# Patient Record
Sex: Male | Born: 1975 | Race: Black or African American | Hispanic: No | Marital: Single | Smoking: Never smoker
Health system: Southern US, Community
[De-identification: ages and names within clinical notes are randomized; demographics above are authoritative.]

## PROBLEM LIST (undated history)

## (undated) VITALS — BP 108/70 | HR 92 | Temp 98.6°F | Resp 20 | Ht 74.0 in | Wt 292.3 lb

## (undated) DIAGNOSIS — M109 Gout, unspecified: Secondary | ICD-10-CM

## (undated) DIAGNOSIS — I10 Essential (primary) hypertension: Secondary | ICD-10-CM

## (undated) DIAGNOSIS — N19 Unspecified kidney failure: Secondary | ICD-10-CM

## (undated) DIAGNOSIS — G629 Polyneuropathy, unspecified: Secondary | ICD-10-CM

## (undated) DIAGNOSIS — J189 Pneumonia, unspecified organism: Secondary | ICD-10-CM

## (undated) DIAGNOSIS — E119 Type 2 diabetes mellitus without complications: Secondary | ICD-10-CM

## (undated) DIAGNOSIS — M5136 Other intervertebral disc degeneration, lumbar region: Secondary | ICD-10-CM

## (undated) DIAGNOSIS — Z992 Dependence on renal dialysis: Secondary | ICD-10-CM

## (undated) DIAGNOSIS — E213 Hyperparathyroidism, unspecified: Secondary | ICD-10-CM

## (undated) DIAGNOSIS — G610 Guillain-Barre syndrome: Secondary | ICD-10-CM

## (undated) DIAGNOSIS — N186 End stage renal disease: Secondary | ICD-10-CM

## (undated) DIAGNOSIS — M51369 Other intervertebral disc degeneration, lumbar region without mention of lumbar back pain or lower extremity pain: Secondary | ICD-10-CM

## (undated) DIAGNOSIS — J9690 Respiratory failure, unspecified, unspecified whether with hypoxia or hypercapnia: Secondary | ICD-10-CM

## (undated) DIAGNOSIS — Z22322 Carrier or suspected carrier of Methicillin resistant Staphylococcus aureus: Secondary | ICD-10-CM

## (undated) DIAGNOSIS — N289 Disorder of kidney and ureter, unspecified: Secondary | ICD-10-CM

## (undated) DIAGNOSIS — F329 Major depressive disorder, single episode, unspecified: Secondary | ICD-10-CM

## (undated) DIAGNOSIS — F32A Depression, unspecified: Secondary | ICD-10-CM

## (undated) DIAGNOSIS — R011 Cardiac murmur, unspecified: Secondary | ICD-10-CM

## (undated) DIAGNOSIS — R627 Adult failure to thrive: Secondary | ICD-10-CM

## (undated) DIAGNOSIS — I509 Heart failure, unspecified: Secondary | ICD-10-CM

## (undated) DIAGNOSIS — K219 Gastro-esophageal reflux disease without esophagitis: Secondary | ICD-10-CM

## (undated) HISTORY — PX: AV FISTULA PLACEMENT: SHX1204

## (undated) HISTORY — DX: Depression, unspecified: F32.A

## (undated) HISTORY — PX: PARATHYROIDECTOMY: SHX19

## (undated) HISTORY — DX: Unspecified kidney failure: N19

## (undated) HISTORY — DX: Gout, unspecified: M10.9

## (undated) HISTORY — PX: TONSILLECTOMY: SUR1361

## (undated) HISTORY — DX: Major depressive disorder, single episode, unspecified: F32.9

## (undated) HISTORY — PX: RENAL BIOPSY: SHX156

## (undated) HISTORY — DX: Essential (primary) hypertension: I10

## (undated) HISTORY — PX: OTHER SURGICAL HISTORY: SHX169

---

## 1998-04-27 DIAGNOSIS — N033 Chronic nephritic syndrome with diffuse mesangial proliferative glomerulonephritis: Secondary | ICD-10-CM | POA: Insufficient documentation

## 2003-05-04 ENCOUNTER — Inpatient Hospital Stay (HOSPITAL_COMMUNITY): Admission: EM | Admit: 2003-05-04 | Discharge: 2003-05-07 | Payer: Self-pay | Admitting: Emergency Medicine

## 2003-05-14 ENCOUNTER — Ambulatory Visit (HOSPITAL_COMMUNITY): Admission: RE | Admit: 2003-05-14 | Discharge: 2003-05-14 | Payer: Self-pay | Admitting: Cardiology

## 2003-07-26 ENCOUNTER — Inpatient Hospital Stay (HOSPITAL_COMMUNITY): Admission: EM | Admit: 2003-07-26 | Discharge: 2003-07-30 | Payer: Self-pay | Admitting: Emergency Medicine

## 2009-05-30 ENCOUNTER — Inpatient Hospital Stay: Payer: Self-pay | Admitting: *Deleted

## 2010-11-29 DIAGNOSIS — Z992 Dependence on renal dialysis: Secondary | ICD-10-CM | POA: Insufficient documentation

## 2011-02-17 ENCOUNTER — Emergency Department: Payer: Self-pay | Admitting: Unknown Physician Specialty

## 2012-05-15 DIAGNOSIS — E214 Other specified disorders of parathyroid gland: Secondary | ICD-10-CM | POA: Insufficient documentation

## 2012-06-08 ENCOUNTER — Inpatient Hospital Stay: Payer: Self-pay

## 2012-06-08 LAB — COMPREHENSIVE METABOLIC PANEL
Albumin: 3.8 g/dL (ref 3.4–5.0)
Anion Gap: 12 (ref 7–16)
BUN: 86 mg/dL — ABNORMAL HIGH (ref 7–18)
Bilirubin,Total: 0.5 mg/dL (ref 0.2–1.0)
Calcium, Total: 7.1 mg/dL — ABNORMAL LOW (ref 8.5–10.1)
Chloride: 105 mmol/L (ref 98–107)
EGFR (Non-African Amer.): 3 — ABNORMAL LOW
Glucose: 92 mg/dL (ref 65–99)
Osmolality: 307 (ref 275–301)
Potassium: 5.9 mmol/L — ABNORMAL HIGH (ref 3.5–5.1)
SGOT(AST): 24 U/L (ref 15–37)
Sodium: 141 mmol/L (ref 136–145)

## 2012-06-08 LAB — PHOSPHORUS: Phosphorus: 3.5 mg/dL (ref 2.5–4.9)

## 2012-06-08 LAB — CBC
HCT: 30.6 % — ABNORMAL LOW (ref 40.0–52.0)
HGB: 10.1 g/dL — ABNORMAL LOW (ref 13.0–18.0)
MCV: 89 fL (ref 80–100)
WBC: 5.7 10*3/uL (ref 3.8–10.6)

## 2012-06-08 LAB — TROPONIN I: Troponin-I: 0.04 ng/mL

## 2012-06-09 LAB — CBC WITH DIFFERENTIAL/PLATELET
Basophil #: 0.1 10*3/uL (ref 0.0–0.1)
Basophil %: 0.7 %
Eosinophil #: 0.2 10*3/uL (ref 0.0–0.7)
HGB: 10.5 g/dL — ABNORMAL LOW (ref 13.0–18.0)
Lymphocyte #: 1.1 10*3/uL (ref 1.0–3.6)
Lymphocyte %: 15.5 %
MCH: 28.9 pg (ref 26.0–34.0)
MCV: 89 fL (ref 80–100)
Monocyte #: 0.6 x10 3/mm (ref 0.2–1.0)
Monocyte %: 7.9 %
Neutrophil #: 5.3 10*3/uL (ref 1.4–6.5)
Platelet: 116 10*3/uL — ABNORMAL LOW (ref 150–440)
RDW: 16.4 % — ABNORMAL HIGH (ref 11.5–14.5)
WBC: 7.3 10*3/uL (ref 3.8–10.6)

## 2012-06-09 LAB — BASIC METABOLIC PANEL
Anion Gap: 13 (ref 7–16)
BUN: 66 mg/dL — ABNORMAL HIGH (ref 7–18)
Calcium, Total: 7.7 mg/dL — ABNORMAL LOW (ref 8.5–10.1)
Chloride: 101 mmol/L (ref 98–107)
Co2: 26 mmol/L (ref 21–32)
Glucose: 87 mg/dL (ref 65–99)
Osmolality: 298 (ref 275–301)
Potassium: 4.1 mmol/L (ref 3.5–5.1)
Sodium: 140 mmol/L (ref 136–145)

## 2012-12-09 DIAGNOSIS — Z8669 Personal history of other diseases of the nervous system and sense organs: Secondary | ICD-10-CM | POA: Insufficient documentation

## 2012-12-09 DIAGNOSIS — M21379 Foot drop, unspecified foot: Secondary | ICD-10-CM | POA: Insufficient documentation

## 2013-02-03 ENCOUNTER — Emergency Department: Payer: Self-pay | Admitting: Emergency Medicine

## 2013-02-03 LAB — CBC
HCT: 42.2 % (ref 40.0–52.0)
HGB: 14.2 g/dL (ref 13.0–18.0)
MCH: 31.6 pg (ref 26.0–34.0)
MCHC: 33.6 g/dL (ref 32.0–36.0)
MCV: 94 fL (ref 80–100)
Platelet: 149 10*3/uL — ABNORMAL LOW (ref 150–440)
RBC: 4.48 10*6/uL (ref 4.40–5.90)
RDW: 18.9 % — ABNORMAL HIGH (ref 11.5–14.5)
WBC: 9.2 10*3/uL (ref 3.8–10.6)

## 2013-02-03 LAB — COMPREHENSIVE METABOLIC PANEL
Albumin: 4.4 g/dL (ref 3.4–5.0)
Alkaline Phosphatase: 79 U/L (ref 50–136)
BUN: 35 mg/dL — ABNORMAL HIGH (ref 7–18)
Calcium, Total: 11.2 mg/dL — ABNORMAL HIGH (ref 8.5–10.1)
Chloride: 97 mmol/L — ABNORMAL LOW (ref 98–107)
Creatinine: 10.71 mg/dL — ABNORMAL HIGH (ref 0.60–1.30)
EGFR (African American): 6 — ABNORMAL LOW
EGFR (Non-African Amer.): 5 — ABNORMAL LOW
Glucose: 114 mg/dL — ABNORMAL HIGH (ref 65–99)
Osmolality: 281 (ref 275–301)
Potassium: 4.5 mmol/L (ref 3.5–5.1)
SGOT(AST): 24 U/L (ref 15–37)
SGPT (ALT): 31 U/L (ref 12–78)
Total Protein: 9.2 g/dL — ABNORMAL HIGH (ref 6.4–8.2)

## 2013-02-03 LAB — TROPONIN I: Troponin-I: <0.02 ng/mL

## 2013-02-16 ENCOUNTER — Emergency Department: Payer: Self-pay | Admitting: Emergency Medicine

## 2013-02-17 LAB — POTASSIUM: Potassium: 5.8 mmol/L — ABNORMAL HIGH (ref 3.5–5.1)

## 2013-02-19 DIAGNOSIS — T82590A Other mechanical complication of surgically created arteriovenous fistula, initial encounter: Secondary | ICD-10-CM | POA: Insufficient documentation

## 2013-04-21 DIAGNOSIS — D689 Coagulation defect, unspecified: Secondary | ICD-10-CM | POA: Insufficient documentation

## 2013-04-21 DIAGNOSIS — T7849XA Other allergy, initial encounter: Secondary | ICD-10-CM | POA: Insufficient documentation

## 2013-06-06 ENCOUNTER — Inpatient Hospital Stay: Payer: Self-pay

## 2013-06-06 LAB — COMPREHENSIVE METABOLIC PANEL
Albumin: 4.2 g/dL (ref 3.4–5.0)
Alkaline Phosphatase: 67 U/L
Anion Gap: 25 — ABNORMAL HIGH (ref 7–16)
BUN: 56 mg/dL — ABNORMAL HIGH (ref 7–18)
Bilirubin,Total: 0.5 mg/dL (ref 0.2–1.0)
Calcium, Total: 9.5 mg/dL (ref 8.5–10.1)
Chloride: 99 mmol/L (ref 98–107)
Co2: 23 mmol/L (ref 21–32)
Creatinine: 14.81 mg/dL — ABNORMAL HIGH (ref 0.60–1.30)
EGFR (African American): 4 — ABNORMAL LOW
EGFR (Non-African Amer.): 4 — ABNORMAL LOW
Glucose: 122 mg/dL — ABNORMAL HIGH (ref 65–99)
Osmolality: 309 (ref 275–301)
Potassium: 4.9 mmol/L (ref 3.5–5.1)
SGOT(AST): 61 U/L — ABNORMAL HIGH (ref 15–37)
SGPT (ALT): 86 U/L — ABNORMAL HIGH (ref 12–78)
Sodium: 147 mmol/L — ABNORMAL HIGH (ref 136–145)
Total Protein: 8.7 g/dL — ABNORMAL HIGH (ref 6.4–8.2)

## 2013-06-06 LAB — PROTIME-INR
INR: 1.2
PROTHROMBIN TIME: 14.7 s (ref 11.5–14.7)

## 2013-06-06 LAB — CBC WITH DIFFERENTIAL/PLATELET
Basophil #: 0.1 10*3/uL (ref 0.0–0.1)
Basophil %: 0.9 %
Eosinophil #: 0.2 10*3/uL (ref 0.0–0.7)
Eosinophil %: 1 %
HCT: 43.4 % (ref 40.0–52.0)
HGB: 13.9 g/dL (ref 13.0–18.0)
Lymphocyte #: 1.9 10*3/uL (ref 1.0–3.6)
Lymphocyte %: 12.3 %
MCH: 31.1 pg (ref 26.0–34.0)
MCHC: 32 g/dL (ref 32.0–36.0)
MCV: 97 fL (ref 80–100)
Monocyte #: 1.1 x10 3/mm — ABNORMAL HIGH (ref 0.2–1.0)
Monocyte %: 7.4 %
Neutrophil #: 11.9 10*3/uL — ABNORMAL HIGH (ref 1.4–6.5)
Neutrophil %: 78.4 %
Platelet: 166 10*3/uL (ref 150–440)
RBC: 4.47 10*6/uL (ref 4.40–5.90)
RDW: 18.9 % — ABNORMAL HIGH (ref 11.5–14.5)
WBC: 15.1 10*3/uL — ABNORMAL HIGH (ref 3.8–10.6)

## 2013-06-06 LAB — APTT: ACTIVATED PTT: 37.8 s — AB (ref 23.6–35.9)

## 2013-06-06 LAB — LIPASE, BLOOD: Lipase: 203 U/L (ref 73–393)

## 2013-06-06 LAB — CLOSTRIDIUM DIFFICILE(ARMC)

## 2013-06-06 LAB — CK: CK, Total: 203 U/L

## 2013-06-06 LAB — TROPONIN I: Troponin-I: <0.02 ng/mL

## 2013-06-06 LAB — SALICYLATE LEVEL: Salicylates, Serum: <1.7 mg/dL

## 2013-06-06 LAB — HEMOGLOBIN: HGB: 12.2 g/dL — ABNORMAL LOW (ref 13.0–18.0)

## 2013-06-07 LAB — CBC WITH DIFFERENTIAL/PLATELET
Bands: 1 %
Basophil: 1 %
Eosinophil: 1 %
HCT: 35.3 % — ABNORMAL LOW (ref 40.0–52.0)
HGB: 11.6 g/dL — ABNORMAL LOW (ref 13.0–18.0)
Lymphocytes: 28 %
MCH: 31.8 pg (ref 26.0–34.0)
MCHC: 32.9 g/dL (ref 32.0–36.0)
MCV: 97 fL (ref 80–100)
MONOS PCT: 3 %
PLATELETS: 102 10*3/uL — AB (ref 150–440)
RBC: 3.64 10*6/uL — ABNORMAL LOW (ref 4.40–5.90)
RDW: 18.7 % — ABNORMAL HIGH (ref 11.5–14.5)
Segmented Neutrophils: 66 %
WBC: 7.5 10*3/uL (ref 3.8–10.6)

## 2013-06-07 LAB — COMPREHENSIVE METABOLIC PANEL
Albumin: 3.3 g/dL — ABNORMAL LOW (ref 3.4–5.0)
Alkaline Phosphatase: 57 U/L
Anion Gap: 5 — ABNORMAL LOW (ref 7–16)
BUN: 36 mg/dL — ABNORMAL HIGH (ref 7–18)
Bilirubin,Total: 0.6 mg/dL (ref 0.2–1.0)
CALCIUM: 8.5 mg/dL (ref 8.5–10.1)
CREATININE: 11.88 mg/dL — AB (ref 0.60–1.30)
Chloride: 101 mmol/L (ref 98–107)
Co2: 29 mmol/L (ref 21–32)
EGFR (Non-African Amer.): 5 — ABNORMAL LOW
GFR CALC AF AMER: 6 — AB
Glucose: 89 mg/dL (ref 65–99)
Osmolality: 278 (ref 275–301)
Potassium: 4.5 mmol/L (ref 3.5–5.1)
SGOT(AST): 35 U/L (ref 15–37)
SGPT (ALT): 54 U/L (ref 12–78)
Sodium: 135 mmol/L — ABNORMAL LOW (ref 136–145)
Total Protein: 6.9 g/dL (ref 6.4–8.2)

## 2013-06-07 LAB — WBCS, STOOL

## 2013-06-08 LAB — CBC WITH DIFFERENTIAL/PLATELET
Basophil #: 0 10*3/uL (ref 0.0–0.1)
Basophil %: 0.6 %
EOS ABS: 0.1 10*3/uL (ref 0.0–0.7)
Eosinophil %: 2.9 %
HCT: 34 % — ABNORMAL LOW (ref 40.0–52.0)
HGB: 11.4 g/dL — AB (ref 13.0–18.0)
Lymphocyte #: 1.1 10*3/uL (ref 1.0–3.6)
Lymphocyte %: 24.2 %
MCH: 32.6 pg (ref 26.0–34.0)
MCHC: 33.6 g/dL (ref 32.0–36.0)
MCV: 97 fL (ref 80–100)
Monocyte #: 0.5 x10 3/mm (ref 0.2–1.0)
Monocyte %: 11.7 %
Neutrophil #: 2.8 10*3/uL (ref 1.4–6.5)
Neutrophil %: 60.6 %
Platelet: 96 10*3/uL — ABNORMAL LOW (ref 150–440)
RBC: 3.51 10*6/uL — AB (ref 4.40–5.90)
RDW: 19.1 % — ABNORMAL HIGH (ref 11.5–14.5)
WBC: 4.7 10*3/uL (ref 3.8–10.6)

## 2013-06-08 LAB — BASIC METABOLIC PANEL
Anion Gap: 15 (ref 7–16)
BUN: 47 mg/dL — ABNORMAL HIGH (ref 7–18)
CALCIUM: 8.3 mg/dL — AB (ref 8.5–10.1)
Chloride: 100 mmol/L (ref 98–107)
Co2: 23 mmol/L (ref 21–32)
Creatinine: 15.13 mg/dL — ABNORMAL HIGH (ref 0.60–1.30)
EGFR (African American): 4 — ABNORMAL LOW
GFR CALC NON AF AMER: 4 — AB
Glucose: 96 mg/dL (ref 65–99)
OSMOLALITY: 288 (ref 275–301)
POTASSIUM: 4.3 mmol/L (ref 3.5–5.1)
Sodium: 138 mmol/L (ref 136–145)

## 2013-06-09 LAB — CBC WITH DIFFERENTIAL/PLATELET
BASOS ABS: 0 10*3/uL (ref 0.0–0.1)
BASOS PCT: 0.8 %
EOS ABS: 0.2 10*3/uL (ref 0.0–0.7)
Eosinophil %: 3.2 %
HCT: 31.4 % — ABNORMAL LOW (ref 40.0–52.0)
HGB: 10.2 g/dL — ABNORMAL LOW (ref 13.0–18.0)
LYMPHS PCT: 20.6 %
Lymphocyte #: 1 10*3/uL (ref 1.0–3.6)
MCH: 31.3 pg (ref 26.0–34.0)
MCHC: 32.6 g/dL (ref 32.0–36.0)
MCV: 96 fL (ref 80–100)
MONOS PCT: 11.2 %
Monocyte #: 0.5 x10 3/mm (ref 0.2–1.0)
NEUTROS PCT: 64.2 %
Neutrophil #: 3.1 10*3/uL (ref 1.4–6.5)
PLATELETS: 106 10*3/uL — AB (ref 150–440)
RBC: 3.27 10*6/uL — AB (ref 4.40–5.90)
RDW: 18.6 % — ABNORMAL HIGH (ref 11.5–14.5)
WBC: 4.9 10*3/uL (ref 3.8–10.6)

## 2013-06-09 LAB — STOOL CULTURE

## 2013-06-09 LAB — POTASSIUM: Potassium: 4 mmol/L (ref 3.5–5.1)

## 2013-06-09 LAB — PHOSPHORUS: PHOSPHORUS: 6.8 mg/dL — AB (ref 2.5–4.9)

## 2013-06-10 LAB — CBC WITH DIFFERENTIAL/PLATELET
Basophil #: 0 10*3/uL (ref 0.0–0.1)
Basophil %: 0.9 %
EOS PCT: 2.4 %
Eosinophil #: 0.1 10*3/uL (ref 0.0–0.7)
HCT: 31.5 % — AB (ref 40.0–52.0)
HGB: 10.3 g/dL — AB (ref 13.0–18.0)
Lymphocyte #: 1.1 10*3/uL (ref 1.0–3.6)
Lymphocyte %: 21.9 %
MCH: 31.5 pg (ref 26.0–34.0)
MCHC: 32.8 g/dL (ref 32.0–36.0)
MCV: 96 fL (ref 80–100)
MONO ABS: 0.7 x10 3/mm (ref 0.2–1.0)
Monocyte %: 13.2 %
NEUTROS PCT: 61.6 %
Neutrophil #: 3.2 10*3/uL (ref 1.4–6.5)
Platelet: 108 10*3/uL — ABNORMAL LOW (ref 150–440)
RBC: 3.28 10*6/uL — ABNORMAL LOW (ref 4.40–5.90)
RDW: 18.5 % — AB (ref 11.5–14.5)
WBC: 5.2 10*3/uL (ref 3.8–10.6)

## 2013-06-11 DIAGNOSIS — K5180 Other ulcerative colitis without complications: Secondary | ICD-10-CM | POA: Insufficient documentation

## 2013-08-30 ENCOUNTER — Emergency Department: Payer: Self-pay | Admitting: Emergency Medicine

## 2013-08-30 LAB — CBC
HCT: 43.6 % (ref 40.0–52.0)
HGB: 14.2 g/dL (ref 13.0–18.0)
MCH: 31.1 pg (ref 26.0–34.0)
MCHC: 32.4 g/dL (ref 32.0–36.0)
MCV: 96 fL (ref 80–100)
Platelet: 137 10*3/uL — ABNORMAL LOW (ref 150–440)
RBC: 4.55 10*6/uL (ref 4.40–5.90)
RDW: 17.9 % — ABNORMAL HIGH (ref 11.5–14.5)
WBC: 6.1 10*3/uL (ref 3.8–10.6)

## 2013-08-30 LAB — BASIC METABOLIC PANEL
Anion Gap: 12 (ref 7–16)
BUN: 53 mg/dL — AB (ref 7–18)
CALCIUM: 9.6 mg/dL (ref 8.5–10.1)
CREATININE: 13.12 mg/dL — AB (ref 0.60–1.30)
Chloride: 96 mmol/L — ABNORMAL LOW (ref 98–107)
Co2: 26 mmol/L (ref 21–32)
EGFR (African American): 5 — ABNORMAL LOW
GFR CALC NON AF AMER: 4 — AB
GLUCOSE: 117 mg/dL — AB (ref 65–99)
Osmolality: 284 (ref 275–301)
Potassium: 5.1 mmol/L (ref 3.5–5.1)
Sodium: 134 mmol/L — ABNORMAL LOW (ref 136–145)

## 2013-08-30 LAB — TROPONIN I

## 2013-09-16 DIAGNOSIS — E878 Other disorders of electrolyte and fluid balance, not elsewhere classified: Secondary | ICD-10-CM | POA: Insufficient documentation

## 2013-10-30 DIAGNOSIS — IMO0001 Reserved for inherently not codable concepts without codable children: Secondary | ICD-10-CM | POA: Insufficient documentation

## 2013-10-30 DIAGNOSIS — R42 Dizziness and giddiness: Secondary | ICD-10-CM | POA: Insufficient documentation

## 2013-12-11 DIAGNOSIS — H53149 Visual discomfort, unspecified: Secondary | ICD-10-CM | POA: Insufficient documentation

## 2013-12-28 DIAGNOSIS — F419 Anxiety disorder, unspecified: Secondary | ICD-10-CM | POA: Insufficient documentation

## 2014-01-08 DIAGNOSIS — R197 Diarrhea, unspecified: Secondary | ICD-10-CM | POA: Insufficient documentation

## 2014-01-08 DIAGNOSIS — R52 Pain, unspecified: Secondary | ICD-10-CM | POA: Insufficient documentation

## 2014-01-08 DIAGNOSIS — T829XXA Unspecified complication of cardiac and vascular prosthetic device, implant and graft, initial encounter: Secondary | ICD-10-CM | POA: Insufficient documentation

## 2014-01-08 DIAGNOSIS — T82868S Thrombosis of vascular prosthetic devices, implants and grafts, sequela: Secondary | ICD-10-CM | POA: Insufficient documentation

## 2014-03-16 DIAGNOSIS — D509 Iron deficiency anemia, unspecified: Secondary | ICD-10-CM | POA: Insufficient documentation

## 2014-05-08 ENCOUNTER — Emergency Department: Payer: Self-pay | Admitting: Emergency Medicine

## 2014-05-23 DIAGNOSIS — R0602 Shortness of breath: Secondary | ICD-10-CM | POA: Insufficient documentation

## 2014-05-23 DIAGNOSIS — L299 Pruritus, unspecified: Secondary | ICD-10-CM | POA: Insufficient documentation

## 2014-05-23 DIAGNOSIS — R519 Headache, unspecified: Secondary | ICD-10-CM | POA: Insufficient documentation

## 2014-07-01 DIAGNOSIS — E1140 Type 2 diabetes mellitus with diabetic neuropathy, unspecified: Secondary | ICD-10-CM | POA: Insufficient documentation

## 2014-07-01 DIAGNOSIS — E1142 Type 2 diabetes mellitus with diabetic polyneuropathy: Secondary | ICD-10-CM | POA: Insufficient documentation

## 2014-07-01 DIAGNOSIS — E119 Type 2 diabetes mellitus without complications: Secondary | ICD-10-CM | POA: Insufficient documentation

## 2014-08-05 ENCOUNTER — Inpatient Hospital Stay
Admission: RE | Admit: 2014-08-05 | Discharge: 2014-08-26 | DRG: 870 | Payer: Medicare Other | Attending: Internal Medicine | Admitting: Internal Medicine

## 2014-08-05 DIAGNOSIS — R6521 Severe sepsis with septic shock: Secondary | ICD-10-CM | POA: Diagnosis present

## 2014-08-05 DIAGNOSIS — E785 Hyperlipidemia, unspecified: Secondary | ICD-10-CM | POA: Diagnosis present

## 2014-08-05 DIAGNOSIS — M6289 Other specified disorders of muscle: Secondary | ICD-10-CM | POA: Diagnosis present

## 2014-08-05 DIAGNOSIS — I120 Hypertensive chronic kidney disease with stage 5 chronic kidney disease or end stage renal disease: Secondary | ICD-10-CM | POA: Diagnosis present

## 2014-08-05 DIAGNOSIS — I959 Hypotension, unspecified: Secondary | ICD-10-CM | POA: Diagnosis present

## 2014-08-05 DIAGNOSIS — D709 Neutropenia, unspecified: Secondary | ICD-10-CM | POA: Diagnosis present

## 2014-08-05 DIAGNOSIS — D631 Anemia in chronic kidney disease: Secondary | ICD-10-CM | POA: Diagnosis present

## 2014-08-05 DIAGNOSIS — Z4659 Encounter for fitting and adjustment of other gastrointestinal appliance and device: Secondary | ICD-10-CM

## 2014-08-05 DIAGNOSIS — N2581 Secondary hyperparathyroidism of renal origin: Secondary | ICD-10-CM | POA: Diagnosis present

## 2014-08-05 DIAGNOSIS — Z978 Presence of other specified devices: Secondary | ICD-10-CM

## 2014-08-05 DIAGNOSIS — N186 End stage renal disease: Secondary | ICD-10-CM | POA: Diagnosis present

## 2014-08-05 DIAGNOSIS — Z9911 Dependence on respirator [ventilator] status: Secondary | ICD-10-CM | POA: Diagnosis not present

## 2014-08-05 DIAGNOSIS — J156 Pneumonia due to other aerobic Gram-negative bacteria: Secondary | ICD-10-CM | POA: Diagnosis present

## 2014-08-05 DIAGNOSIS — Z79899 Other long term (current) drug therapy: Secondary | ICD-10-CM

## 2014-08-05 DIAGNOSIS — J9811 Atelectasis: Secondary | ICD-10-CM | POA: Diagnosis present

## 2014-08-05 DIAGNOSIS — G9340 Encephalopathy, unspecified: Secondary | ICD-10-CM | POA: Diagnosis present

## 2014-08-05 DIAGNOSIS — J9690 Respiratory failure, unspecified, unspecified whether with hypoxia or hypercapnia: Secondary | ICD-10-CM

## 2014-08-05 DIAGNOSIS — E872 Acidosis: Secondary | ICD-10-CM | POA: Diagnosis present

## 2014-08-05 DIAGNOSIS — J80 Acute respiratory distress syndrome: Secondary | ICD-10-CM | POA: Diagnosis present

## 2014-08-05 DIAGNOSIS — J189 Pneumonia, unspecified organism: Secondary | ICD-10-CM | POA: Diagnosis present

## 2014-08-05 DIAGNOSIS — A419 Sepsis, unspecified organism: Secondary | ICD-10-CM | POA: Diagnosis present

## 2014-08-05 DIAGNOSIS — I2510 Atherosclerotic heart disease of native coronary artery without angina pectoris: Secondary | ICD-10-CM | POA: Diagnosis present

## 2014-08-05 DIAGNOSIS — J9600 Acute respiratory failure, unspecified whether with hypoxia or hypercapnia: Secondary | ICD-10-CM | POA: Diagnosis present

## 2014-08-05 DIAGNOSIS — I359 Nonrheumatic aortic valve disorder, unspecified: Secondary | ICD-10-CM | POA: Diagnosis not present

## 2014-08-05 DIAGNOSIS — N250 Renal osteodystrophy: Secondary | ICD-10-CM | POA: Diagnosis present

## 2014-08-05 DIAGNOSIS — Z841 Family history of disorders of kidney and ureter: Secondary | ICD-10-CM | POA: Diagnosis not present

## 2014-08-05 DIAGNOSIS — D61818 Other pancytopenia: Secondary | ICD-10-CM | POA: Diagnosis present

## 2014-08-05 DIAGNOSIS — G610 Guillain-Barre syndrome: Secondary | ICD-10-CM | POA: Diagnosis present

## 2014-08-05 DIAGNOSIS — R509 Fever, unspecified: Secondary | ICD-10-CM

## 2014-08-05 DIAGNOSIS — Z992 Dependence on renal dialysis: Secondary | ICD-10-CM | POA: Diagnosis not present

## 2014-08-05 DIAGNOSIS — G629 Polyneuropathy, unspecified: Secondary | ICD-10-CM | POA: Diagnosis present

## 2014-08-05 DIAGNOSIS — I770 Arteriovenous fistula, acquired: Secondary | ICD-10-CM | POA: Diagnosis present

## 2014-08-05 DIAGNOSIS — E669 Obesity, unspecified: Secondary | ICD-10-CM | POA: Diagnosis present

## 2014-08-05 DIAGNOSIS — I517 Cardiomegaly: Secondary | ICD-10-CM | POA: Diagnosis present

## 2014-08-05 DIAGNOSIS — J9601 Acute respiratory failure with hypoxia: Secondary | ICD-10-CM | POA: Diagnosis present

## 2014-08-05 HISTORY — DX: End stage renal disease: N18.6

## 2014-08-05 HISTORY — DX: Dependence on renal dialysis: Z99.2

## 2014-08-05 LAB — CBC WITH DIFFERENTIAL/PLATELET
Basophil #: 0 10*3/uL (ref 0.0–0.1)
Basophil %: 0.6 %
EOS ABS: 0.1 10*3/uL (ref 0.0–0.7)
Eosinophil %: 0.9 %
HCT: 37.4 % — AB (ref 40.0–52.0)
HGB: 12.3 g/dL — ABNORMAL LOW (ref 13.0–18.0)
LYMPHS ABS: 0.2 10*3/uL — AB (ref 1.0–3.6)
Lymphocyte %: 2.9 %
MCH: 30.4 pg (ref 26.0–34.0)
MCHC: 32.9 g/dL (ref 32.0–36.0)
MCV: 92 fL (ref 80–100)
MONOS PCT: 13.5 %
Monocyte #: 0.8 x10 3/mm (ref 0.2–1.0)
NEUTROS ABS: 4.6 10*3/uL (ref 1.4–6.5)
Neutrophil %: 82.1 %
Platelet: 121 10*3/uL — ABNORMAL LOW (ref 150–440)
RBC: 4.05 10*6/uL — ABNORMAL LOW (ref 4.40–5.90)
RDW: 16.5 % — ABNORMAL HIGH (ref 11.5–14.5)
WBC: 5.6 10*3/uL (ref 3.8–10.6)

## 2014-08-05 LAB — COMPREHENSIVE METABOLIC PANEL
ALBUMIN: 4.4 g/dL
Alkaline Phosphatase: 51 U/L
Anion Gap: 15 (ref 7–16)
BUN: 31 mg/dL — ABNORMAL HIGH
Bilirubin,Total: 0.5 mg/dL
Calcium, Total: 10.2 mg/dL
Chloride: 95 mmol/L — ABNORMAL LOW
Co2: 27 mmol/L
Creatinine: 10.01 mg/dL — ABNORMAL HIGH
EGFR (African American): 7 — ABNORMAL LOW
EGFR (Non-African Amer.): 6 — ABNORMAL LOW
Glucose: 91 mg/dL
POTASSIUM: 4.6 mmol/L
SGOT(AST): 35 U/L
SGPT (ALT): 18 U/L
SODIUM: 137 mmol/L
Total Protein: 7.8 g/dL

## 2014-08-05 LAB — TROPONIN I: Troponin-I: <0.03 ng/mL

## 2014-08-05 LAB — PHOSPHORUS: Phosphorus: 2.7 mg/dL

## 2014-08-05 LAB — RAPID INFLUENZA A&B ANTIGENS

## 2014-08-05 LAB — PROTIME-INR
INR: 1.1
Prothrombin Time: 14.3 secs

## 2014-08-05 LAB — LACTIC ACID, PLASMA: Lactic Acid, Venous: 4.3 mmol/L

## 2014-08-05 LAB — TSH: THYROID STIMULATING HORM: 3.66 u[IU]/mL

## 2014-08-05 LAB — MAGNESIUM: MAGNESIUM: 1.9 mg/dL

## 2014-08-06 LAB — BASIC METABOLIC PANEL
ANION GAP: 14 (ref 7–16)
BUN: 40 mg/dL — AB
CREATININE: 11.31 mg/dL — AB
Calcium, Total: 8.9 mg/dL
Chloride: 96 mmol/L — ABNORMAL LOW
Co2: 28 mmol/L
EGFR (African American): 6 — ABNORMAL LOW
EGFR (Non-African Amer.): 5 — ABNORMAL LOW
GLUCOSE: 91 mg/dL
Potassium: 4.8 mmol/L
Sodium: 138 mmol/L

## 2014-08-06 LAB — CBC WITH DIFFERENTIAL/PLATELET
Basophil #: 0 10*3/uL (ref 0.0–0.1)
Basophil %: 0.4 %
EOS ABS: 0 10*3/uL (ref 0.0–0.7)
EOS PCT: 0.3 %
HCT: 32.2 % — ABNORMAL LOW (ref 40.0–52.0)
HGB: 10.3 g/dL — ABNORMAL LOW (ref 13.0–18.0)
LYMPHS PCT: 6.4 %
Lymphocyte #: 0.3 10*3/uL — ABNORMAL LOW (ref 1.0–3.6)
MCH: 29.7 pg (ref 26.0–34.0)
MCHC: 31.9 g/dL — AB (ref 32.0–36.0)
MCV: 93 fL (ref 80–100)
Monocyte #: 1.4 x10 3/mm — ABNORMAL HIGH (ref 0.2–1.0)
Monocyte %: 26 %
NEUTROS ABS: 3.5 10*3/uL (ref 1.4–6.5)
Neutrophil %: 66.9 %
Platelet: 88 10*3/uL — ABNORMAL LOW (ref 150–440)
RBC: 3.47 10*6/uL — ABNORMAL LOW (ref 4.40–5.90)
RDW: 16.6 % — AB (ref 11.5–14.5)
WBC: 5.2 10*3/uL (ref 3.8–10.6)

## 2014-08-07 LAB — RAPID HIV SCREEN (HIV 1/2 AB+AG)

## 2014-08-07 LAB — PHOSPHORUS: PHOSPHORUS: 7 mg/dL — AB

## 2014-08-09 LAB — VANCOMYCIN, RANDOM: Vancomycin, Random: 23 ug/mL

## 2014-08-10 DIAGNOSIS — I359 Nonrheumatic aortic valve disorder, unspecified: Secondary | ICD-10-CM

## 2014-08-10 LAB — CBC WITH DIFFERENTIAL/PLATELET
BASOS ABS: 0 10*3/uL (ref 0.0–0.1)
Basophil %: 0.4 %
EOS ABS: 0 10*3/uL (ref 0.0–0.7)
Eosinophil %: 0.4 %
HCT: 34.3 % — ABNORMAL LOW (ref 40.0–52.0)
HGB: 10.9 g/dL — ABNORMAL LOW (ref 13.0–18.0)
Lymphocyte #: 0.4 10*3/uL — ABNORMAL LOW (ref 1.0–3.6)
Lymphocyte %: 18.2 %
MCH: 29.3 pg (ref 26.0–34.0)
MCHC: 31.8 g/dL — AB (ref 32.0–36.0)
MCV: 92 fL (ref 80–100)
MONOS PCT: 9.9 %
Monocyte #: 0.2 x10 3/mm (ref 0.2–1.0)
Neutrophil #: 1.6 10*3/uL (ref 1.4–6.5)
Neutrophil %: 71.1 %
PLATELETS: 72 10*3/uL — AB (ref 150–440)
RBC: 3.73 10*6/uL — ABNORMAL LOW (ref 4.40–5.90)
RDW: 16.7 % — AB (ref 11.5–14.5)
WBC: 2.3 10*3/uL — AB (ref 3.8–10.6)

## 2014-08-10 LAB — RENAL FUNCTION PANEL
Albumin: 3.5 g/dL
Anion Gap: 23 — ABNORMAL HIGH (ref 7–16)
BUN: 85 mg/dL — ABNORMAL HIGH
CALCIUM: 6.7 mg/dL — AB
CO2: 18 mmol/L — AB
CREATININE: 21.95 mg/dL — AB
Chloride: 97 mmol/L — ABNORMAL LOW
EGFR (African American): 3 — ABNORMAL LOW
EGFR (Non-African Amer.): 2 — ABNORMAL LOW
Glucose: 120 mg/dL — ABNORMAL HIGH
Phosphorus: 6.4 mg/dL — ABNORMAL HIGH
Potassium: 5.9 mmol/L — ABNORMAL HIGH
Sodium: 138 mmol/L

## 2014-08-10 LAB — CULTURE, BLOOD (SINGLE)

## 2014-08-10 LAB — LACTIC ACID, PLASMA: Lactic Acid, Venous: 0.9 mmol/L

## 2014-08-11 DIAGNOSIS — I359 Nonrheumatic aortic valve disorder, unspecified: Secondary | ICD-10-CM

## 2014-08-11 LAB — CBC WITH DIFFERENTIAL/PLATELET
BASOS PCT: 0.4 %
Basophil #: 0 10*3/uL (ref 0.0–0.1)
EOS PCT: 0.2 %
Eosinophil #: 0 10*3/uL (ref 0.0–0.7)
HCT: 33.2 % — AB (ref 40.0–52.0)
HGB: 10.6 g/dL — AB (ref 13.0–18.0)
LYMPHS ABS: 0.5 10*3/uL — AB (ref 1.0–3.6)
Lymphocyte %: 28.5 %
MCH: 29.6 pg (ref 26.0–34.0)
MCHC: 31.9 g/dL — ABNORMAL LOW (ref 32.0–36.0)
MCV: 93 fL (ref 80–100)
Monocyte #: 0.2 x10 3/mm (ref 0.2–1.0)
Monocyte %: 10 %
Neutrophil #: 1 10*3/uL — ABNORMAL LOW (ref 1.4–6.5)
Neutrophil %: 60.9 %
PLATELETS: 68 10*3/uL — AB (ref 150–440)
RBC: 3.58 10*6/uL — AB (ref 4.40–5.90)
RDW: 16.7 % — ABNORMAL HIGH (ref 11.5–14.5)
WBC: 1.6 10*3/uL — CL (ref 3.8–10.6)

## 2014-08-11 LAB — BASIC METABOLIC PANEL
Anion Gap: 15 (ref 7–16)
BUN: 58 mg/dL — ABNORMAL HIGH
CALCIUM: 7.2 mg/dL — AB
CHLORIDE: 94 mmol/L — AB
CO2: 28 mmol/L
CREATININE: 16.11 mg/dL — AB
GFR CALC AF AMER: 4 — AB
GFR CALC NON AF AMER: 3 — AB
Glucose: 108 mg/dL — ABNORMAL HIGH
Potassium: 4.3 mmol/L
Sodium: 137 mmol/L

## 2014-08-11 LAB — SEDIMENTATION RATE: Erythrocyte Sed Rate: 63 mm/hr — ABNORMAL HIGH (ref 0–15)

## 2014-08-11 LAB — CK: CK, TOTAL: 1903 U/L — AB

## 2014-08-11 LAB — LIPASE, BLOOD: Lipase: 104 U/L — ABNORMAL HIGH

## 2014-08-11 LAB — AMMONIA: AMMONIA, PLASMA: 18 umol/L

## 2014-08-11 LAB — MONONUCLEOSIS SCREEN: Mono Test: NEGATIVE

## 2014-08-11 LAB — AMYLASE: Amylase: 176 U/L — ABNORMAL HIGH

## 2014-08-11 LAB — TSH: Thyroid Stimulating Horm: 2.932 u[IU]/mL

## 2014-08-12 DIAGNOSIS — I959 Hypotension, unspecified: Secondary | ICD-10-CM

## 2014-08-12 LAB — BASIC METABOLIC PANEL
Anion Gap: 16 (ref 7–16)
BUN: 77 mg/dL — ABNORMAL HIGH
CHLORIDE: 96 mmol/L — AB
CO2: 25 mmol/L
Calcium, Total: 6.8 mg/dL — CL
Creatinine: 19.46 mg/dL — ABNORMAL HIGH
GFR CALC AF AMER: 3 — AB
GFR CALC NON AF AMER: 3 — AB
Glucose: 119 mg/dL — ABNORMAL HIGH
Potassium: 4.6 mmol/L
SODIUM: 137 mmol/L

## 2014-08-12 LAB — CBC WITH DIFFERENTIAL/PLATELET
BASOS ABS: 0 10*3/uL (ref 0.0–0.1)
Basophil %: 0.7 %
EOS PCT: 0.5 %
Eosinophil #: 0 10*3/uL (ref 0.0–0.7)
HCT: 31.3 % — ABNORMAL LOW (ref 40.0–52.0)
HGB: 10.1 g/dL — AB (ref 13.0–18.0)
Lymphocyte #: 0.4 10*3/uL — ABNORMAL LOW (ref 1.0–3.6)
Lymphocyte %: 23.8 %
MCH: 29.6 pg (ref 26.0–34.0)
MCHC: 32.1 g/dL (ref 32.0–36.0)
MCV: 92 fL (ref 80–100)
MONOS PCT: 8.8 %
Monocyte #: 0.2 x10 3/mm (ref 0.2–1.0)
NEUTROS PCT: 66.2 %
Neutrophil #: 1.2 10*3/uL — ABNORMAL LOW (ref 1.4–6.5)
PLATELETS: 76 10*3/uL — AB (ref 150–440)
RBC: 3.39 10*6/uL — ABNORMAL LOW (ref 4.40–5.90)
RDW: 16.6 % — AB (ref 11.5–14.5)
WBC: 1.9 10*3/uL — AB (ref 3.8–10.6)

## 2014-08-12 LAB — CULTURE, BLOOD (SINGLE)

## 2014-08-12 LAB — PROTIME-INR
INR: 1
PROTHROMBIN TIME: 13.1 s

## 2014-08-12 LAB — PHOSPHORUS: Phosphorus: 5.4 mg/dL — ABNORMAL HIGH

## 2014-08-12 LAB — VANCOMYCIN, TROUGH: Vancomycin, Trough: 9 ug/mL — ABNORMAL LOW

## 2014-08-13 ENCOUNTER — Other Ambulatory Visit: Payer: Self-pay

## 2014-08-13 LAB — BASIC METABOLIC PANEL
Anion Gap: 16 (ref 7–16)
BUN: 56 mg/dL — AB
Calcium, Total: 6.8 mg/dL — CL
Chloride: 98 mmol/L — ABNORMAL LOW
Co2: 25 mmol/L
Creatinine: 14.74 mg/dL — ABNORMAL HIGH
Glucose: 134 mg/dL — ABNORMAL HIGH
POTASSIUM: 4.3 mmol/L
Sodium: 139 mmol/L

## 2014-08-13 LAB — CBC WITH DIFFERENTIAL/PLATELET
BANDS NEUTROPHIL: 2 %
HCT: 30.8 % — ABNORMAL LOW (ref 40.0–52.0)
HGB: 10.2 g/dL — AB (ref 13.0–18.0)
Lymphocytes: 9 %
MCH: 30.5 pg (ref 26.0–34.0)
MCHC: 33.1 g/dL (ref 32.0–36.0)
MCV: 92 fL (ref 80–100)
MONOS PCT: 15 %
Platelet: 105 10*3/uL — ABNORMAL LOW (ref 150–440)
RBC: 3.35 10*6/uL — AB (ref 4.40–5.90)
RDW: 17.1 % — ABNORMAL HIGH (ref 11.5–14.5)
Segmented Neutrophils: 74 %
WBC: 2.9 10*3/uL — ABNORMAL LOW (ref 3.8–10.6)

## 2014-08-13 LAB — CK: CK, Total: 1504 U/L — ABNORMAL HIGH

## 2014-08-13 LAB — PHOSPHORUS: Phosphorus: 5.5 mg/dL — ABNORMAL HIGH

## 2014-08-13 LAB — MAGNESIUM: Magnesium: 2.1 mg/dL

## 2014-08-13 LAB — VALPROIC ACID LEVEL

## 2014-08-14 LAB — BASIC METABOLIC PANEL
Anion Gap: 16 (ref 7–16)
BUN: 67 mg/dL — AB
CHLORIDE: 96 mmol/L — AB
Calcium, Total: 6.6 mg/dL — CL
Co2: 28 mmol/L
Creatinine: 17.41 mg/dL — ABNORMAL HIGH
EGFR (Non-African Amer.): 3 — ABNORMAL LOW
GFR CALC AF AMER: 4 — AB
GLUCOSE: 148 mg/dL — AB
POTASSIUM: 5.3 mmol/L — AB
Sodium: 140 mmol/L

## 2014-08-14 LAB — CBC WITH DIFFERENTIAL/PLATELET
Bands: 1 %
HCT: 29.9 % — AB (ref 40.0–52.0)
HGB: 9.6 g/dL — ABNORMAL LOW (ref 13.0–18.0)
LYMPHS PCT: 21 %
MCH: 29.5 pg (ref 26.0–34.0)
MCHC: 32 g/dL (ref 32.0–36.0)
MCV: 92 fL (ref 80–100)
MONOS PCT: 6 %
PLATELETS: 159 10*3/uL (ref 150–440)
RBC: 3.24 10*6/uL — AB (ref 4.40–5.90)
RDW: 16.7 % — ABNORMAL HIGH (ref 11.5–14.5)
Segmented Neutrophils: 72 %
WBC: 4.4 10*3/uL (ref 3.8–10.6)

## 2014-08-14 LAB — PHOSPHORUS: Phosphorus: 5.2 mg/dL — ABNORMAL HIGH

## 2014-08-14 LAB — MAGNESIUM: MAGNESIUM: 2 mg/dL

## 2014-08-14 NOTE — H&P (Signed)
PATIENT NAME:  Patrick Downs, Patrick Downs MR#:  [id number] DATE OF BIRTH:  [birth year]  DATE OF ADMISSION:  06/08/2012  PRIMARY CARE PHYSICIAN:  Dr. Ola Spurr with [facility].  CHIEF COMPLAINT:  Shortness of breath and cough.   HISTORY OF PRESENT ILLNESS:  This is a 39 year old male with past medical history of end-stage renal disease and on hemodialysis since the age of 86 years.  His kidney failure due to medication and history of hypertension.  Yesterday he could not go to the dialysis center because of transportation issue due to weather and he missed his dialysis.  Today he started having shortness of breath and he had some dry cough so he decided to come to Emergency Room.  On x-ray he was found having some congestion in the chest and his potassium was high, 5.9, with some EKG changes of peaked T waves, so he is set up for urgent dialysis.  ER physician called nephrologist and he is supposed to get dialysis tonight.  He is being admitted for getting hemodialysis and for further monitoring.   REVIEW OF SYSTEMS:  CONSTITUTIONAL:  Negative for fever, fatigue, weakness, pain or weight loss.  EYES:  No blurring or double lesion or discharges.  EARS, NOSE, THROAT:  No tinnitus, ear pain or hearing loss.  RESPIRATORY:  Has some dry cough and shortness of breath, but no wheezing.  CARDIOVASCULAR:  No chest pain, orthopnea, palpitation or edema.  GASTROINTESTINAL:  No nausea, vomiting, diarrhea, abdominal pain.  GENITOURINARY:  No dysuria, hematuria or increased frequency.  ENDOCRINOLOGY:  No heat or cold intolerance.  HEMATOLOGIC:  No anemia, easy bruising or bleeding.  MUSCULOSKELETAL:  No pain or swelling in the joints.  NEUROLOGICAL:  No numbness, weakness or tremor.  PSYCHIATRIC:  No anxiety, insomnia or depression.  Has mild complaint of headache.   PAST MEDICAL HISTORY:  Hypertension, end-stage renal disease on hemodialysis, Guillain-Barre syndrome (GBS).  PAST SURGICAL HISTORY:   Thyroidectomy.    HOME MEDICATIONS:  PhosLo.   SOCIAL HISTORY:  Lives with family, nonsmoker, drinker.  He smokes marijuana.  Last smoke was yesterday.   FAMILY HISTORY:  Positive for hemodialysis in his father and his grandfather.   PHYSICAL EXAMINATION:   VITAL SIGNS:  In the ER, temperature 98, pulse 91, respirations 20, blood pressure 174/98 and pulse oximetry 97% on room air.  GENERAL:  He is fully alert, oriented to time, place and person, mild distress due to headache and cooperative with history-taking and physical examination.  HEENT:  Head and neck atraumatic.  Conjunctivae pink.  Oral mucosa moist.  NECK:  Supple.  No JVD.  RESPIRATORY:  Bilateral equal air entry, few crepitations heard.  CARDIOVASCULAR:  S1, S2 present, murmur present, loud sounds, regular rhythm.  ABDOMEN:  Soft, nontender.  Bowel sounds normal.  No organomegaly.  SKIN: No rashes.  JOINTS:  No swelling or tenderness.  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  PSYCHIATRIC:  Does not appear in any acute psychiatric distress at this point of time.   LABORATORY DATA:  Glucose 92, BUN 86, creatinine 17.69, sodium 141, potassium 5.9, chloride 105, CO2 24, calcium 7.1, total protein 7.2, albumin 3.8, bilirubin 0.5, alkaline phosphate 80, SGOT 24, SGPT 25.  Troponin 0.04.  WBC 5.7, hemoglobin 10.1, platelet count 100, MCV 89.  Chest x-ray, shallow  inspiration, mild interstitial prominence, component of which may represent pulmonary edema versus nonedematous interstitial infiltrate.   ASSESSMENT AND PLAN:  The patient is a 39 year old male with history of end-stage renal disease on hemodialysis and hypertension presented with missed hemodialysis and shortness of breath.  1.  Fluid overload, missed hemodialysis.  We will do urgent hemodialysis today.  ER  physician called nephrology 2.  Hyperkalemia.  He has some EKG changes, peaked T waves.  We will give calcium gluconate stat and urgent dialysis is arranged by ER.  3.  Hypertension.  We will give him metoprolol.  He was not taking any medication at home as he said his blood pressure was under control.  This might be because of missed hemodialysis.  We need to re-evaluate after hemodialysis how is his blood pressure. 4.  Deep vein thrombosis and GI prophylaxis.   CODE STATUS:  FULL CODE.  TOTAL TIME SPENT:  50 minutes.    ____________________________ Ceasar Lund Anselm Jungling, MD vgv:ea D: 06/08/2012 19:56:51 ET T: 06/08/2012 23:59:34 ET JOB#: [id number]  cc: Ceasar Lund. Anselm Jungling, MD, <Dictator> Vaughan Basta MD ELECTRONICALLY SIGNED 06/14/2012 18:22

## 2014-08-14 NOTE — Discharge Summary (Signed)
PATIENT NAME:  Patrick Downs, Patrick Downs MR#:  [id number] DATE OF BIRTH:  1975/07/05  DATE OF ADMISSION:  06/08/2012 DATE OF DISCHARGE:  06/09/2012  PRIMARY CARE PHYSICIAN:  Adrian Prows, M.D.   DISCHARGE DIAGNOSES: 1.  Fluid overload.  2.  Hyperkalemia.  3.  End-stage renal disease.  4.  Hypertension.   HISTORY AND PHYSICAL: This is a 39 year old male with a history of end-stage renal disease on hemodialysis who presented after missing dialysis with complaints of shortness of breath. He had missed dialysis the day prior due to problems with transportation and poor weather.  He had one day of shortness of breath with dry cough. Chest x-ray in the ED showed evidence of fluid overload. Potassium was elevated at 5.9. EKG showed peaked T waves.   HOSPITAL COURSE: The patient was admitted. Nephrology was consultation and he was seen by Dr. Juleen China. He was given calcium gluconate for his hyperkalemia.  Hemodialysis was performed the evening of admission. The following day he reported feeling improved. He was no longer having any shortness of breath. He had no acute events overnight and his subsequent potassium level was normal at 4.1.   DISCHARGE MEDICATIONS: These were the same as admission medications which included: 1.  Omeprazole 20 mg delayed release once daily.  2.  Sevelamer 800 mg 2 tabs 3 times daily.  3.  Ergocalciferol 50,000 units once weekly.  4.  Clonidine 0.3 mg 1 tab t.i.d.  5.  Carvedilol 25 mg twice daily.  6.  Amlodipine 10 mg twice daily.  7.  Simvastatin 5 mg at bedtime.  8.  Renvela 800 mg 3 tabs t.i.d.  9.  Hydroxyzine 10 mg 3 times daily p.r.n.   DISCHARGE INSTRUCTIONS:  Follow up with Dr. Ola Spurr in 2 to 4 weeks. ____________________________ A. Lavone Orn, MD ams:ct D: 06/09/2012 10:09:04 ET T: 06/09/2012 11:41:03 ET JOB#: [id number]  cc: A. Lavone Orn, MD, <Dictator> Cheral Marker. Ola Spurr, MD Gracy Bruins Tryniti Laatsch MD ELECTRONICALLY SIGNED 06/16/2012 19:42

## 2014-08-15 LAB — INFLUENZA A,B,H1N1 - PCR (ARMC)
H1N1FLUPCR: NOT DETECTED
Influenza A By PCR: NEGATIVE
Influenza B By PCR: NEGATIVE

## 2014-08-15 LAB — CULTURE, BLOOD (SINGLE)

## 2014-08-15 LAB — BASIC METABOLIC PANEL WITH GFR
Anion Gap: 13
BUN: 40 mg/dL — ABNORMAL HIGH
Calcium, Total: 6.8 mg/dL — CL
Chloride: 93 mmol/L — ABNORMAL LOW
Co2: 32 mmol/L
Creatinine: 12.19 mg/dL — ABNORMAL HIGH
EGFR (African American): 5 — ABNORMAL LOW
EGFR (Non-African Amer.): 5 — ABNORMAL LOW
Glucose: 183 mg/dL — ABNORMAL HIGH
Potassium: 4.8 mmol/L
Sodium: 138 mmol/L

## 2014-08-15 LAB — MAGNESIUM: Magnesium: 2.1 mg/dL

## 2014-08-15 LAB — PHOSPHORUS: Phosphorus: 5.1 mg/dL — ABNORMAL HIGH

## 2014-08-15 NOTE — Consult Note (Signed)
Pt had 2 bowel movements during the night, denies nausea or vomiting, no bleeding in stools, pain med helped abd discomfort.  VSS afebrile,  Will advance diet to renal and see how his bowels do.  Electronic Signatures: Manya Silvas (MD)  (Signed on 15-Feb-15 09:32)  Authored  Last Updated: 15-Feb-15 09:32 by Manya Silvas (MD)

## 2014-08-15 NOTE — Consult Note (Signed)
Pt still with diarrhea, went 4 times last night.  VSS there is tenderness esp upper and left upper abd.  Will do flex sig tomorrow.  No blood in stool with last movement.  WBC down to 7.5, was 15 on admission.  hgb 11.6.  Neg Hep B S Ag.  Electronic Signatures: Manya Silvas (MD)  (Signed on 14-Feb-15 13:04)  Authored  Last Updated: 14-Feb-15 13:04 by Manya Silvas (MD)

## 2014-08-15 NOTE — Consult Note (Signed)
PATIENT NAME:  Patrick Downs, Patrick Downs MR#:  [id number] DATE OF BIRTH:  04-13-1976  DATE OF CONSULTATION:  06/06/2013  REFERRING PHYSICIAN:  Max Sane, MD CONSULTING PHYSICIAN: Gaylyn Cheers, MD / Janalyn Harder. Jerelene Redden, ANP (Adult Nurse Practitioner)  REASON FOR CONSULTATION: Possible colitis.   HISTORY OF PRESENT ILLNESS: This 39 year old patient with history of end-stage renal disease, hypertension, hyperlipidemia, depression, and Guillain Barre syndrome in 2000 presented to the Emergency Room for abdominal pain with diarrhea and vomiting. He reports on Monday his stomach was burning. He did have a stress test yesterday at [facility] as part of his work-up for kidney transplant. He did eat a bowl of cereal about 11:00 and had a stress test about 1:00 and did fine. He did have a soft bowel movement before the stress test. After the stress tes, t he had problems with nausea and reported a low blood pressure that required  IV NSS infusion. He was given  apple juice. He vomited.   The cardiology staff took him to the Emergency Room at [facility] to try to get his blood pressure up. He was given pain medications, antiemetic, and sent home. On the wasy home, he stopped at [facility] and ate a chicken sandwich at about 8pm. Within the next few hours his stomach had twisty, sharp pain, but  the burning sensation that he thought maybe was gastritis had resolved. He did vomit up the chicken sandwich and said just liquid emesis came up. He says his stomach felt a little better for 20 minutes and then he had vomiting again and diarrhea again. This diarrhea was liquid bowel movement. He passed blood and came to the Emergency Room. He had another bloody stool in the ER and once this morning right after the CT was done. He says he has never passed blood like this in the past. The CT of the abdomen showed pancolitis. Currently, he has diffuse stomach ache, somewhat crampiness, has had no further bowel movements all day. He is in the middle of  dialysis at this interview.   The patient does take ibuprofen maybe 3 tablets three times a week. He takes Tylenol and Benadryl before his Monday, Wednesday, and Friday dialysis. He had antibiotics at end of October for fistula replacement. He had some chills at that time and had IV vancomycin. He did have a bad cold in November and had oral antibiotics. No antibiotic since. No ill contacts. The patient denies fevers or chills. He has suspected somewhat of gastritis since 2009. He has utilized over-the-counter proton pump inhibitors at intervals and he denies hematemesis or previous EGD study. He does report a colonoscopy in 2006 in [place], [place] and reports results were unremarkable.   PAST MEDICAL HISTORY: 1.  Depression.  2.  Hyperlipidemia.  3.  End-stage renal disease with dialysis Monday, Wednesday, and Friday, left arm AVF.  4.  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  5.  Depression.   PAST SURGICAL HISTORY:  1.  Parathyroidectomy.  2.  Kidney biopsy.  3.  Fistula replacements.  4.  Tonsillectomy.   ALLERGIES: MINOXIDIL.   HABITS: The patient denies alcohol or tobacco use. He is single, he is divorced, has a girlfriend, unemployed.   REVIEW OF SYSTEMS: Ten systems reviewed. Pertinent positives known in history of present illness. Says he has been doing well, except for this acute GI problem this week.   PHYSICAL EXAMINATION: VITAL SIGNS: 97.6, 79, 16, 98/63, pulse ox room air 96%.  GENERAL: Large  framed African American male, looks healthy, looks chronically ill, in no acute distress. The patient is undergoing dialysis during the interview.  HEENT: Head is normocephalic. Conjunctivae pink. Sclerae anicteric.  NECK: Supple. Trachea midline.  CARDIAC: S1 and S2 without murmur or gallop.  LUNGS: CTA. Respirations are eupneic.  ABDOMEN: Flat, soft, mildly tender, generalized, consistent with colitis picture.  RECTAL: Deferred.  SKIN: Warm and dry  without rash or edema. The patient is shivering some, has blankets. He says this is his baseline with dialysis.  PSYCH: Affect and mood within normal. He is very pleasant, alert, and oriented. Good historian.  NEUROLOGIC: Cranial nerves grossly intact.   LABORATORY AND DIAGNOSTICS: Admission blood work with glucose 122, BUN 56, creatinine 14.8 consistent with missing scheduled dialysis. Potassium 4.9, albumin 4.2, AST 61, ALT 86. Slightly elevated pro time, 14.7. INR 1.2. PTT 37.8. WBC 15, hemoglobin 13.9, dipped to 12.2. Lactic acid 1.4.   Chest x-ray, PA single view, is clear.   CT of the abdomen shows diffuse mucosal edema throughout the entire colon. There is also slight edema of the mucosa of the terminal ileum. The remainder of the small bowel is normal. There is a 6 mm polyp in the fundus of the gallbladder, unchanged. Liver is unremarkable. There are innumerable bilateral renal cysts and numerous calcifications.   IMPRESSION: This is a patient with end-stage renal disease who comes in with acute nausea, vomiting, and diarrhea. CT shows pancolitis. This is an acute presentation and likely bacterial. This could be food poisoning. The patient has already been started on Cipro and Flagyl.   PLAN: 1.  Obtain stat stool for WBC as well as Clostridium difficile and comprehensive culture.  2.  Continue with the antibiotics as arranged. Avoid NSAIDs.  3.  Repeat CBC with differential in the morning secondary to leukocytosis.  4.  Renal patients are at risk for gastritis and gastric erosions, therefore, we will increase his Protonix to b.i.d. The patient does have p.r.n. Zofran to be used to avoid vomiting and recommend that he requests that if needed.   This case was discussed with Dr. Vira Agar in collaboration of care. Further GI recommendations pending. Thank you for this consult.  This services provided by Joelene Millin A. Jerelene Redden, MS, APRN, BC, ANP under collaborative agreement with Dr. Gaylyn Cheers.   ____________________________ Janalyn Harder. Jerelene Redden, ANP (Adult Nurse Practitioner) kam:sb D: 06/06/2013 14:01:08 ET T: 06/06/2013 14:23:00 ET JOB#: [id number]  cc: Joelene Millin A. Jerelene Redden, ANP (Adult Nurse Practitioner), <Dictator> Janalyn Harder Sherlyn Hay, MSN, ANP-BC Adult Nurse Practitioner ELECTRONICALLY SIGNED 06/06/2013 18:06

## 2014-08-15 NOTE — Consult Note (Signed)
Pt had 3 diarrheal stools then passed BRB.  His total colonic inflammation seen on CT and his story most suggests an infectious cause, possible food poisoning.  Agree with antibiotic coverage and will follow with you.  Hold colonoscopy for now.  His cultures may not reflect reality if the stool was collected after antibiotics given.  Electronic Signatures: Manya Silvas (MD)  (Signed on 13-Feb-15 19:45)  Authored  Last Updated: 13-Feb-15 19:45 by Manya Silvas (MD)

## 2014-08-15 NOTE — H&P (Signed)
PATIENT NAME:  Patrick Downs, PASZTOR MR#:  [id number] DATE OF BIRTH:  06-16-1975  DATE OF ADMISSION:  06/06/2013  PRIMARY CARE PHYSICIAN:  Dr. Ola Spurr.  REQUESTING PHYSICIAN:  Dr. Delman Kitten.  CHIEF COMPLAINT:  Diarrhea.  HISTORY OF PRESENT ILLNESS:  The patient is a 39 year old male who had a recent stress test at [facility] as a work up to get him on the transplant list for kidney transplant. The patient had a stress test yesterday after which he started having abdominal pain, nausea, vomiting, diarrhea, and went to the Emergency Department at [facility] where he was found to be hypotensive. He was hydrated with fluids and was discharged home on antinausea medication but by the time he went home around 8:00 p.m., his nausea, vomiting and abdominal pain continued to get worse. Finally decided to come back to the Emergency Department from 1:30 in the morning where he underwent CT scan of the abdomen and pelvis, which showed diffuse colitis, and he is being admitted for further evaluation and management. The patient is a dialysis patient getting Monday, Wednesday, Friday as an outpatient.   PAST MEDICAL HISTORY:  1.  Hypertension.  2.  End-stage renal disease on dialysis on Monday, Wednesday, Friday. 3.  GBS.    PAST SURGICAL HISTORY:  Thyroidectomy.   MEDICATIONS AT HOME:  PhosLo.   SOCIAL HISTORY:  He lives with his wife. No smoking or alcohol. He does smoke marijuana.   FAMILY HISTORY:  Positive for hemodialysis in his father and his grandfather.   REVIEW OF SYSTEMS:  CONSTITUTIONAL:  No fever, fatigue, weakness. EYES:  No blurred or double vision.  ENT:  No tinnitus or ear pain.  RESPIRATORY:  No cough, wheezing, hemoptysis.  CARDIOVASCULAR:  No chest pain, no orthopnea, edema.  GASTROINTESTINAL:  Positive for nausea, vomiting, diarrhea and abdominal pain.  GENITOURINARY:  No dysuria or hematuria. He is a dialysis patient.  ENDOCRINE:  No polyuria or nocturia.  HEMATOLOGY:  Anemia or easy  bruising.  SKIN:  No rash or lesion.  MUSCULOSKELETAL:  No arthritis or muscle cramp.  NEUROLOGIC:  No tingling, numbness, weakness.  PSYCHIATRIC:  No history of anxiety or depression.   PHYSICAL EXAMINATION:  VITAL SIGNS:  Temperature 97.6, heart rate 86 per minute, respirations 22 per minute, blood pressure 137/66 mmHg, saturating 95% on room air.  GENERAL:  A 39 year old male lying in the bed comfortably without any acute distress.  EYES:  Pupils equal, round, reactive to light and accommodation. No scleral icterus. Extraocular muscles intact.  HEENT:  Head atraumatic, normocephalic. Oropharynx and nasopharynx clear.  NECK:  Supple. No jugular venous distention. No thyroid enlargement, no tenderness.  LUNGS:  Clear to auscultation bilaterally. No wheezing, rales, rhonchi or crepitation.  CARDIOVASCULAR:  S1, S2 normal. No murmurs, rubs, gallop.  ABDOMEN:  Soft, nontender, nondistended. Bowel sounds present. No organomegaly or mass.  EXTREMITIES:  No pedal edema, cyanosis or clubbing.  NEUROLOGIC:  Cranial nerves II through XII intact. Muscle strength 5/5 in extremities. Sensation intact.  PSYCHIATRIC:  Alert and oriented x 3.  SKIN:  No obvious rash, lesion, ulcer.   LABORATORY PANEL:  Normal BMP except sodium of 147. BUN of 56, creatinine 14.81. Normal cardiac enzymes. Normal CBC except white count of 15.1. Hemoglobin of 13.9. Normal LFTs with AST of 61, ALT of 86. PT of 14.7, INR 1.2. PTT of 37.8.   Chest x-ray on 13th of February showed no acute cardiopulmonary disease.   CT scan of the abdomen and pelvis with  contrast on 13th of February showed diffuse colitis, no pericolonic inflammation.    IMPRESSION AND PLAN:   1.  Colitis. We will start him on IV Cipro and Flagyl. obtain stool studies. We will consult Gastroenterology. We will keep him n.p.o. except medications for now. Provide pain medication as needed.  2.  End-stage renal disease, on hemodialysis: We will consult  Nephrology for his dialysis need.  3.  Anemia of chronic kidney disease. Monitor his hemoglobin and hematocrit. No gross bleeding. Procrit per Nephrology.  4.  Leukocytosis, likely due to colitis. We will monitor white count.   CODE STATUS:  FULL CODE.   TOTAL TIME TAKING CARE OF THIS PATIENT:  55 minutes. The case was transferred over to Dr. Dion Body.  ____________________________ Lucina Mellow. Manuella Ghazi, MD vss:jm D: 06/06/2013 17:25:00 ET T: 06/06/2013 18:03:31 ET JOB#: [id number]  cc: Freedom Lopezperez S. Manuella Ghazi, MD, <Dictator> Cheral Marker. Ola Spurr, MD [place] MD ELECTRONICALLY SIGNED 06/07/2013 12:24

## 2014-08-15 NOTE — Discharge Summary (Signed)
PATIENT NAME:  Patrick Downs, Patrick Downs MR#:  [id number] DATE OF BIRTH:  [birth year]  DATE OF ADMISSION:  06/06/2013 DATE OF DISCHARGE:  06/10/2013   DISCHARGE DIAGNOSES:  1. Acute colitis.  2. End-stage renal disease.  3. Thrombocytopenia.   HISTORY OF PRESENT ILLNESS: Please see initial history and physical for details. Briefly, the patient is a 39 year old gentleman on hemodialysis, who was admitted with acute nausea, vomiting and diarrhea.   HOSPITAL COURSE: He was found on CT to have diffuse colon edema. Stool cultures and C. difficile were negative. White count was normal. He did have some blood mixed with the stool, but his hemoglobin remained stable.   The patient was seen by Dr. Vira Agar, and his diet was slowly advanced. He underwent a flexible sigmoidoscopy which was unrevealing on February 16th. His diet was advanced to full, and his diarrhea improved. On the day of discharge, he was having semisolid stools and had only had 2 stools the day prior. He had no fevers, chills or vomiting.   He was also seen by renal and underwent dialysis on Friday and Monday. He will be discharged home to resume his outpatient dialysis on Wednesday.   DISCHARGE MEDICATIONS: Please see [facility] physician discharge summary. He will finish 3 more days of Cipro and Flagyl. He will also be given a prescription for Zofran for nausea, Prilosec 40 mg once a day as well as tramadol for pain.   DISCHARGE FOLLOWUP: The patient will follow up with Dr. Ola Spurr in 1 to 2 weeks and with Dr. Vira Agar in 1 to 2 weeks.   CODE STATUS: The patient is full code.  TIME SPENT: This discharge took 35 minutes.   ____________________________ Cheral Marker. Ola Spurr, MD dpf:lb D: 06/10/2013 13:32:44 ET T: 06/10/2013 13:46:14 ET JOB#: [id number]  cc: Cheral Marker. Ola Spurr, MD, <Dictator> Armya Westerhoff Ola Spurr MD ELECTRONICALLY SIGNED 06/18/2013 23:20

## 2014-08-15 NOTE — Consult Note (Signed)
Pt still with diarrhea, will do flex late today after dialysis with fleets enema in endo,.  Electronic Signatures: Manya Silvas (MD)  (Signed on 16-Feb-15 09:34)  Authored  Last Updated: 16-Feb-15 09:34 by Manya Silvas (MD)

## 2014-08-16 LAB — BASIC METABOLIC PANEL
ANION GAP: 18 — AB (ref 7–16)
BUN: 73 mg/dL — ABNORMAL HIGH
CHLORIDE: 93 mmol/L — AB
Calcium, Total: 6.9 mg/dL — CL
Co2: 29 mmol/L
Creatinine: 14.52 mg/dL — ABNORMAL HIGH
EGFR (African American): 4 — ABNORMAL LOW
EGFR (Non-African Amer.): 4 — ABNORMAL LOW
Glucose: 176 mg/dL — ABNORMAL HIGH
POTASSIUM: 4.7 mmol/L
SODIUM: 140 mmol/L

## 2014-08-16 LAB — CBC WITH DIFFERENTIAL/PLATELET
BASOS ABS: 0 10*3/uL (ref 0.0–0.1)
Basophil %: 0.1 %
EOS ABS: 0 10*3/uL (ref 0.0–0.7)
Eosinophil %: 0 %
HCT: 25.9 % — AB (ref 40.0–52.0)
HGB: 8.3 g/dL — ABNORMAL LOW (ref 13.0–18.0)
LYMPHS ABS: 0.5 10*3/uL — AB (ref 1.0–3.6)
Lymphocyte %: 8.5 %
MCH: 29.6 pg (ref 26.0–34.0)
MCHC: 32.1 g/dL (ref 32.0–36.0)
MCV: 92 fL (ref 80–100)
Monocyte #: 0.8 x10 3/mm (ref 0.2–1.0)
Monocyte %: 13.4 %
Neutrophil #: 4.7 10*3/uL (ref 1.4–6.5)
Neutrophil %: 78 %
Platelet: 161 10*3/uL (ref 150–440)
RBC: 2.81 10*6/uL — ABNORMAL LOW (ref 4.40–5.90)
RDW: 16.6 % — AB (ref 11.5–14.5)
WBC: 6 10*3/uL (ref 3.8–10.6)

## 2014-08-16 LAB — BRONCHIAL WASH CULTURE

## 2014-08-17 LAB — VANCOMYCIN, TROUGH: VANCOMYCIN, TROUGH: 10 ug/mL

## 2014-08-18 LAB — CBC WITH DIFFERENTIAL/PLATELET
BASOS ABS: 0 10*3/uL (ref 0.0–0.1)
BASOS PCT: 0.1 %
EOS ABS: 0 10*3/uL (ref 0.0–0.7)
Eosinophil %: 0.2 %
HCT: 25.4 % — ABNORMAL LOW (ref 40.0–52.0)
HGB: 8.6 g/dL — AB (ref 13.0–18.0)
LYMPHS PCT: 9 %
Lymphocyte #: 0.9 10*3/uL — ABNORMAL LOW (ref 1.0–3.6)
MCH: 30.6 pg (ref 26.0–34.0)
MCHC: 33.8 g/dL (ref 32.0–36.0)
MCV: 91 fL (ref 80–100)
Monocyte #: 1.1 x10 3/mm — ABNORMAL HIGH (ref 0.2–1.0)
Monocyte %: 11 %
NEUTROS PCT: 79.7 %
Neutrophil #: 7.6 10*3/uL — ABNORMAL HIGH (ref 1.4–6.5)
Platelet: 243 10*3/uL (ref 150–440)
RBC: 2.8 10*6/uL — ABNORMAL LOW (ref 4.40–5.90)
RDW: 16.6 % — ABNORMAL HIGH (ref 11.5–14.5)
WBC: 9.6 10*3/uL (ref 3.8–10.6)

## 2014-08-18 LAB — RENAL FUNCTION PANEL
Albumin: 2.4 g/dL — ABNORMAL LOW
Anion Gap: 14 (ref 7–16)
BUN: 61 mg/dL — AB
Calcium, Total: 7.5 mg/dL — ABNORMAL LOW
Chloride: 95 mmol/L — ABNORMAL LOW
Co2: 29 mmol/L
Creatinine: 9.6 mg/dL — ABNORMAL HIGH
EGFR (Non-African Amer.): 6 — ABNORMAL LOW
GFR CALC AF AMER: 7 — AB
Glucose: 96 mg/dL
Phosphorus: 3.8 mg/dL
Potassium: 4 mmol/L
Sodium: 138 mmol/L

## 2014-08-18 LAB — CULTURE, BLOOD (SINGLE)

## 2014-08-18 LAB — TRIGLYCERIDES: TRIGLYCERIDES: 902 mg/dL — AB

## 2014-08-19 LAB — COMPREHENSIVE METABOLIC PANEL
ALK PHOS: 57 U/L
ANION GAP: 13 (ref 7–16)
Albumin: 2.1 g/dL — ABNORMAL LOW
BUN: 82 mg/dL — AB
Bilirubin,Total: 0.6 mg/dL
CALCIUM: 6.9 mg/dL — AB
CREATININE: 11.58 mg/dL — AB
Chloride: 94 mmol/L — ABNORMAL LOW
Co2: 27 mmol/L
EGFR (African American): 6 — ABNORMAL LOW
EGFR (Non-African Amer.): 5 — ABNORMAL LOW
Glucose: 124 mg/dL — ABNORMAL HIGH
Potassium: 4.5 mmol/L
SGOT(AST): 61 U/L — ABNORMAL HIGH
SGPT (ALT): 63 U/L

## 2014-08-19 LAB — CBC WITH DIFFERENTIAL/PLATELET
BASOS PCT: 0.2 %
Basophil #: 0 10*3/uL (ref 0.0–0.1)
EOS ABS: 0.1 10*3/uL (ref 0.0–0.7)
Eosinophil %: 0.8 %
HCT: 22.8 % — ABNORMAL LOW (ref 40.0–52.0)
HGB: 7.7 g/dL — AB (ref 13.0–18.0)
LYMPHS ABS: 0.6 10*3/uL — AB (ref 1.0–3.6)
Lymphocyte %: 6.9 %
MCH: 31 pg (ref 26.0–34.0)
MCHC: 33.8 g/dL (ref 32.0–36.0)
MCV: 92 fL (ref 80–100)
Monocyte #: 0.8 x10 3/mm (ref 0.2–1.0)
Monocyte %: 8.3 %
NEUTROS ABS: 7.8 10*3/uL — AB (ref 1.4–6.5)
Neutrophil %: 83.8 %
Platelet: 208 10*3/uL (ref 150–440)
RBC: 2.49 10*6/uL — AB (ref 4.40–5.90)
RDW: 16.9 % — ABNORMAL HIGH (ref 11.5–14.5)
WBC: 9.3 10*3/uL (ref 3.8–10.6)

## 2014-08-20 LAB — BASIC METABOLIC PANEL
ANION GAP: 14 (ref 7–16)
BUN: 64 mg/dL — ABNORMAL HIGH
CO2: 28 mmol/L
Calcium, Total: 6.9 mg/dL — CL
Chloride: 94 mmol/L — ABNORMAL LOW
Creatinine: 8.67 mg/dL — ABNORMAL HIGH
EGFR (Non-African Amer.): 7 — ABNORMAL LOW
GFR CALC AF AMER: 8 — AB
Glucose: 161 mg/dL — ABNORMAL HIGH
Potassium: 5 mmol/L
SODIUM: 136 mmol/L

## 2014-08-20 LAB — CBC WITH DIFFERENTIAL/PLATELET
BASOS ABS: 0 10*3/uL (ref 0.0–0.1)
Basophil %: 0.4 %
EOS ABS: 0.1 10*3/uL (ref 0.0–0.7)
Eosinophil %: 0.9 %
HCT: 24 % — ABNORMAL LOW (ref 40.0–52.0)
HGB: 8.1 g/dL — ABNORMAL LOW (ref 13.0–18.0)
LYMPHS ABS: 0.6 10*3/uL — AB (ref 1.0–3.6)
Lymphocyte %: 6.9 %
MCH: 30.7 pg (ref 26.0–34.0)
MCHC: 33.8 g/dL (ref 32.0–36.0)
MCV: 91 fL (ref 80–100)
MONO ABS: 0.8 x10 3/mm (ref 0.2–1.0)
Monocyte %: 9.4 %
NEUTROS ABS: 7.4 10*3/uL — AB (ref 1.4–6.5)
Neutrophil %: 82.4 %
Platelet: 245 10*3/uL (ref 150–440)
RBC: 2.64 10*6/uL — ABNORMAL LOW (ref 4.40–5.90)
RDW: 16.9 % — AB (ref 11.5–14.5)
WBC: 8.9 10*3/uL (ref 3.8–10.6)

## 2014-08-21 LAB — TRIGLYCERIDES: Triglycerides: 673 mg/dL — ABNORMAL HIGH

## 2014-08-21 LAB — PHOSPHORUS: Phosphorus: 7.2 mg/dL — ABNORMAL HIGH

## 2014-08-22 DIAGNOSIS — A419 Sepsis, unspecified organism: Secondary | ICD-10-CM | POA: Diagnosis present

## 2014-08-22 LAB — PHOSPHORUS: Phosphorus: 6 mg/dL — ABNORMAL HIGH

## 2014-08-22 LAB — CBC WITH DIFFERENTIAL/PLATELET
BASOS ABS: 0 10*3/uL (ref 0.0–0.1)
Basophil %: 0.6 %
Eosinophil #: 0 10*3/uL (ref 0.0–0.7)
Eosinophil %: 0.5 %
HCT: 23.9 % — ABNORMAL LOW (ref 40.0–52.0)
HGB: 7.7 g/dL — ABNORMAL LOW (ref 13.0–18.0)
LYMPHS ABS: 0.9 10*3/uL — AB (ref 1.0–3.6)
Lymphocyte %: 11.2 %
MCH: 29.5 pg (ref 26.0–34.0)
MCHC: 32.1 g/dL (ref 32.0–36.0)
MCV: 92 fL (ref 80–100)
MONO ABS: 1.6 x10 3/mm — AB (ref 0.2–1.0)
Monocyte %: 19.7 %
NEUTROS PCT: 68 %
Neutrophil #: 5.7 10*3/uL (ref 1.4–6.5)
PLATELETS: 195 10*3/uL (ref 150–440)
RBC: 2.6 10*6/uL — ABNORMAL LOW (ref 4.40–5.90)
RDW: 17.3 % — ABNORMAL HIGH (ref 11.5–14.5)
WBC: 8.4 10*3/uL (ref 3.8–10.6)

## 2014-08-22 LAB — BASIC METABOLIC PANEL
Anion Gap: 14 (ref 7–16)
BUN: 71 mg/dL — ABNORMAL HIGH
CHLORIDE: 95 mmol/L — AB
Calcium, Total: 7.1 mg/dL — ABNORMAL LOW
Co2: 27 mmol/L
Creatinine: 8.87 mg/dL — ABNORMAL HIGH
EGFR (African American): 8 — ABNORMAL LOW
GFR CALC NON AF AMER: 7 — AB
Glucose: 176 mg/dL — ABNORMAL HIGH
Potassium: 5.1 mmol/L
Sodium: 136 mmol/L

## 2014-08-22 LAB — MAGNESIUM: Magnesium: 2.3 mg/dL

## 2014-08-22 MED ORDER — SODIUM CHLORIDE 0.9 % IV SOLN
500.0000 mg | INTRAVENOUS | Status: DC
  Administered 2014-08-23 – 2014-08-25 (×3): 500 mg via INTRAVENOUS
  Filled 2014-08-22 (×4): qty 0.5

## 2014-08-22 MED ORDER — RENA-VITE PO TABS
1.0000 | ORAL_TABLET | Freq: Every day | ORAL | Status: DC
  Administered 2014-08-23 – 2014-08-25 (×2): 1 via ORAL
  Filled 2014-08-22 (×4): qty 1

## 2014-08-22 MED ORDER — FAMOTIDINE 20 MG PO TABS
20.0000 mg | ORAL_TABLET | Freq: Every day | ORAL | Status: DC
  Administered 2014-08-23 – 2014-08-25 (×2): 20 mg via ORAL
  Filled 2014-08-22 (×2): qty 1

## 2014-08-22 MED ORDER — VITAL HIGH PROTEIN PO LIQD: 1000.0000 mL | ORAL | Status: DC

## 2014-08-22 MED ORDER — SENNOSIDES 8.8 MG/5ML PO SYRP
10.0000 mL | ORAL_SOLUTION | Freq: Two times a day (BID) | ORAL | Status: DC
  Administered 2014-08-23 – 2014-08-26 (×5): 10 mL via ORAL
  Filled 2014-08-22 (×6): qty 10

## 2014-08-22 MED ORDER — BENZONATATE 100 MG PO CAPS: 100.0000 mg | ORAL_CAPSULE | Freq: Four times a day (QID) | ORAL | Status: DC | PRN

## 2014-08-22 MED ORDER — SODIUM CHLORIDE 0.9 % IJ SOLN
5.0000 mL | Freq: Every day | INTRAMUSCULAR | Status: DC
  Administered 2014-08-24 – 2014-08-26 (×2): 5 mL via INTRAVENOUS

## 2014-08-22 MED ORDER — ACETAMINOPHEN 650 MG RE SUPP: 650.0000 mg | RECTAL | Status: DC | PRN

## 2014-08-22 MED ORDER — SODIUM CHLORIDE 0.9 % IJ SOLN: 3.0000 mL | INTRAMUSCULAR | Status: DC | PRN

## 2014-08-22 MED ORDER — SODIUM CHLORIDE 0.9 % IJ SOLN: 5.0000 mL | Freq: Every day | INTRAMUSCULAR | Status: DC | PRN

## 2014-08-22 MED ORDER — DEXMEDETOMIDINE HCL IN NACL 200 MCG/50ML IV SOLN: 0.0000 ug/kg/h | INTRAVENOUS | Status: DC

## 2014-08-22 MED ORDER — IBUPROFEN 400 MG PO TABS: 400.0000 mg | ORAL_TABLET | Freq: Four times a day (QID) | ORAL | Status: DC | PRN

## 2014-08-22 MED ORDER — CHLORHEXIDINE GLUCONATE 0.12 % MT SOLN
5.0000 mL | Freq: Two times a day (BID) | OROMUCOSAL | Status: DC
  Administered 2014-08-23 (×2): 5 mL via OROMUCOSAL

## 2014-08-22 MED ORDER — PRO-STAT SUGAR FREE PO LIQD: 60.0000 mL | Freq: Two times a day (BID) | ORAL | Status: DC

## 2014-08-22 MED ORDER — VECURONIUM BROMIDE 10 MG IV SOLR
10.0000 mg | INTRAVENOUS | Status: DC | PRN
  Administered 2014-08-23: 10 mg via INTRAVENOUS

## 2014-08-22 MED ORDER — FLUCONAZOLE IN SODIUM CHLORIDE 400-0.9 MG/200ML-% IV SOLN
400.0000 mg | INTRAVENOUS | Status: DC
  Administered 2014-08-23 – 2014-08-25 (×2): 400 mg via INTRAVENOUS
  Filled 2014-08-22 (×2): qty 200

## 2014-08-22 MED ORDER — MIDAZOLAM 50MG/50ML (1MG/ML) PREMIX INFUSION
0.0000 mg/h | INTRAVENOUS | Status: DC
  Administered 2014-08-23 (×6): 10 mg/h via INTRAVENOUS
  Administered 2014-08-24: 6 mg/h via INTRAVENOUS
  Filled 2014-08-22 (×6): qty 50

## 2014-08-22 MED ORDER — CALCIUM CARBONATE-VITAMIN D 500-200 MG-UNIT PO TABS
1.0000 | ORAL_TABLET | Freq: Two times a day (BID) | ORAL | Status: DC
  Administered 2014-08-23 – 2014-08-26 (×5): 1 via ORAL
  Filled 2014-08-22 (×5): qty 1

## 2014-08-22 MED ORDER — ALPRAZOLAM 1 MG PO TABS
1.0000 mg | ORAL_TABLET | Freq: Four times a day (QID) | ORAL | Status: DC
  Administered 2014-08-23 – 2014-08-24 (×5): 1 mg via ORAL
  Filled 2014-08-22 (×4): qty 1

## 2014-08-22 MED ORDER — SODIUM CHLORIDE 0.9 % IV SOLN: 0.0300 [IU]/min | INTRAVENOUS | Status: DC

## 2014-08-22 MED ORDER — NEPRO/CARBSTEADY PO LIQD: 1000.0000 mL | ORAL | Status: DC

## 2014-08-22 MED ORDER — DOCUSATE SODIUM 50 MG/5ML PO LIQD
100.0000 mg | Freq: Two times a day (BID) | ORAL | Status: DC
  Administered 2014-08-23 – 2014-08-26 (×5): 100 mg via ORAL
  Filled 2014-08-22 (×6): qty 10

## 2014-08-22 MED ORDER — MIDAZOLAM HCL 2 MG/2ML IJ SOLN: 2.0000 mg | INTRAMUSCULAR | Status: DC | PRN

## 2014-08-22 MED ORDER — PRO-STAT SUGAR FREE PO LIQD
60.0000 mL | Freq: Three times a day (TID) | ORAL | Status: DC
  Administered 2014-08-24: 60 mL via ORAL

## 2014-08-22 MED ORDER — ACETAMINOPHEN 325 MG PO TABS: 650.0000 mg | ORAL_TABLET | ORAL | Status: DC | PRN

## 2014-08-22 MED ORDER — OXYCODONE HCL 5 MG/5ML PO SOLN
10.0000 mg | ORAL | Status: DC
  Administered 2014-08-23 – 2014-08-24 (×8): 10 mg via ORAL
  Filled 2014-08-22 (×7): qty 10

## 2014-08-22 MED ORDER — QUETIAPINE FUMARATE 100 MG PO TABS
100.0000 mg | ORAL_TABLET | Freq: Two times a day (BID) | ORAL | Status: DC
  Administered 2014-08-23 – 2014-08-24 (×3): 100 mg via ORAL
  Filled 2014-08-22 (×3): qty 1

## 2014-08-22 MED ORDER — HEPARIN SODIUM (PORCINE) 5000 UNIT/ML IJ SOLN
5000.0000 [IU] | Freq: Two times a day (BID) | INTRAMUSCULAR | Status: DC
  Administered 2014-08-23 – 2014-08-26 (×7): 5000 [IU] via SUBCUTANEOUS
  Filled 2014-08-22 (×7): qty 1

## 2014-08-22 MED ORDER — FENTANYL 2500MCG IN NS 250ML (10MCG/ML) PREMIX INFUSION
0.0000 ug/h | INTRAVENOUS | Status: DC
  Administered 2014-08-23 – 2014-08-24 (×6): 400 ug/h via INTRAVENOUS
  Filled 2014-08-22 (×5): qty 250

## 2014-08-22 MED ORDER — VANCOMYCIN HCL 1000 MG IV SOLR
1500.0000 mg | INTRAVENOUS | Status: DC | PRN
  Filled 2014-08-22: qty 1500

## 2014-08-22 MED ORDER — NOREPINEPHRINE BITARTRATE 1 MG/ML IV SOLN
0.0000 ug/min | INTRAVENOUS | Status: DC
  Administered 2014-08-23: 20 ug/min via INTRAVENOUS
  Filled 2014-08-22: qty 4

## 2014-08-22 MED ORDER — CALCIUM ACETATE (PHOS BINDER) 667 MG PO CAPS
2001.0000 mg | ORAL_CAPSULE | Freq: Three times a day (TID) | ORAL | Status: DC
  Administered 2014-08-23 – 2014-08-26 (×7): 2001 mg via ORAL
  Filled 2014-08-22 (×6): qty 3

## 2014-08-22 MED ORDER — BUDESONIDE 0.5 MG/2ML IN SUSP
0.5000 mg | Freq: Two times a day (BID) | RESPIRATORY_TRACT | Status: DC
  Administered 2014-08-23 (×2): 0.5 mg via RESPIRATORY_TRACT
  Filled 2014-08-22 (×3): qty 2

## 2014-08-22 MED ORDER — IPRATROPIUM-ALBUTEROL 0.5-2.5 (3) MG/3ML IN SOLN
3.0000 mL | RESPIRATORY_TRACT | Status: DC
  Administered 2014-08-23 – 2014-08-26 (×22): 3 mL via RESPIRATORY_TRACT
  Filled 2014-08-22 (×22): qty 3

## 2014-08-22 MED ORDER — SODIUM CHLORIDE 0.9 % IV SOLN
0.0300 [IU]/min | INTRAVENOUS | Status: DC
  Administered 2014-08-23: 0.02 [IU]/min via INTRAVENOUS
  Administered 2014-08-23: 0.03 [IU]/min via INTRAVENOUS
  Filled 2014-08-22 (×2): qty 2

## 2014-08-23 ENCOUNTER — Inpatient Hospital Stay: Payer: Medicare Other

## 2014-08-23 DIAGNOSIS — J9601 Acute respiratory failure with hypoxia: Secondary | ICD-10-CM

## 2014-08-23 DIAGNOSIS — J9600 Acute respiratory failure, unspecified whether with hypoxia or hypercapnia: Secondary | ICD-10-CM | POA: Diagnosis present

## 2014-08-23 DIAGNOSIS — Z992 Dependence on renal dialysis: Secondary | ICD-10-CM

## 2014-08-23 DIAGNOSIS — R6521 Severe sepsis with septic shock: Secondary | ICD-10-CM

## 2014-08-23 DIAGNOSIS — J189 Pneumonia, unspecified organism: Secondary | ICD-10-CM | POA: Diagnosis present

## 2014-08-23 DIAGNOSIS — N186 End stage renal disease: Secondary | ICD-10-CM

## 2014-08-23 DIAGNOSIS — A419 Sepsis, unspecified organism: Secondary | ICD-10-CM | POA: Diagnosis present

## 2014-08-23 DIAGNOSIS — R509 Fever, unspecified: Secondary | ICD-10-CM | POA: Insufficient documentation

## 2014-08-23 DIAGNOSIS — I959 Hypotension, unspecified: Secondary | ICD-10-CM | POA: Diagnosis present

## 2014-08-23 DIAGNOSIS — J80 Acute respiratory distress syndrome: Secondary | ICD-10-CM | POA: Diagnosis not present

## 2014-08-23 DIAGNOSIS — I770 Arteriovenous fistula, acquired: Secondary | ICD-10-CM | POA: Diagnosis present

## 2014-08-23 LAB — CBC WITH DIFFERENTIAL/PLATELET
Basophils Absolute: 0.1 10*3/uL (ref 0–0.1)
Basophils Relative: 1 %
Eosinophils Absolute: 0 10*3/uL (ref 0–0.7)
Eosinophils Relative: 0 %
HCT: 21.7 % — ABNORMAL LOW (ref 40.0–52.0)
Hemoglobin: 7 g/dL — ABNORMAL LOW (ref 13.0–18.0)
Lymphocytes Relative: 9 %
Lymphs Abs: 0.9 10*3/uL — ABNORMAL LOW (ref 1.0–3.6)
MCH: 29.3 pg (ref 26.0–34.0)
MCHC: 32.3 g/dL (ref 32.0–36.0)
MCV: 91 fL (ref 80.0–100.0)
Monocytes Absolute: 2 10*3/uL — ABNORMAL HIGH (ref 0.2–1.0)
Monocytes Relative: 19 %
Neutro Abs: 7.5 10*3/uL — ABNORMAL HIGH (ref 1.4–6.5)
Neutrophils Relative %: 71 %
Platelets: 218 10*3/uL (ref 150–440)
RBC: 2.39 MIL/uL — ABNORMAL LOW (ref 4.40–5.90)
RDW: 16.8 % — ABNORMAL HIGH (ref 11.5–14.5)
WBC: 10.5 10*3/uL (ref 3.8–10.6)

## 2014-08-23 LAB — COMPREHENSIVE METABOLIC PANEL
ALK PHOS: 79 U/L (ref 38–126)
ALT: 58 U/L (ref 17–63)
AST: 68 U/L — ABNORMAL HIGH (ref 15–41)
Albumin: 2 g/dL — ABNORMAL LOW (ref 3.5–5.0)
Anion gap: 14 (ref 5–15)
BUN: 81 mg/dL — ABNORMAL HIGH (ref 6–20)
CHLORIDE: 94 mmol/L — AB (ref 101–111)
CO2: 27 mmol/L (ref 22–32)
Calcium: 6.7 mg/dL — ABNORMAL LOW (ref 8.9–10.3)
Creatinine, Ser: 10.52 mg/dL — ABNORMAL HIGH (ref 0.61–1.24)
GFR calc Af Amer: 6 mL/min — ABNORMAL LOW (ref >60)
GFR, EST NON AFRICAN AMERICAN: 5 mL/min — AB (ref >60)
GLUCOSE: 149 mg/dL — AB (ref 65–99)
POTASSIUM: 5.2 mmol/L — AB (ref 3.5–5.1)
Sodium: 135 mmol/L (ref 135–145)
Total Bilirubin: 0.7 mg/dL (ref 0.3–1.2)
Total Protein: 6.4 g/dL — ABNORMAL LOW (ref 6.5–8.1)

## 2014-08-23 LAB — CULTURE, BLOOD (SINGLE)

## 2014-08-23 LAB — MAGNESIUM: MAGNESIUM: 2.6 mg/dL — AB (ref 1.7–2.4)

## 2014-08-23 LAB — PHOSPHORUS: PHOSPHORUS: 7.1 mg/dL — AB (ref 2.5–4.6)

## 2014-08-23 MED ORDER — LIDOCAINE-PRILOCAINE 2.5-2.5 % EX CREA
1.0000 "application " | TOPICAL_CREAM | CUTANEOUS | Status: DC | PRN
  Filled 2014-08-23: qty 5

## 2014-08-23 MED ORDER — SCOPOLAMINE 1 MG/3DAYS TD PT72
1.0000 | MEDICATED_PATCH | TRANSDERMAL | Status: DC
  Administered 2014-08-23 – 2014-08-26 (×3): 1.5 mg via TRANSDERMAL
  Filled 2014-08-23 (×3): qty 1

## 2014-08-23 MED ORDER — HEPARIN SODIUM (PORCINE) 1000 UNIT/ML DIALYSIS
1000.0000 [IU] | INTRAMUSCULAR | Status: DC | PRN
  Filled 2014-08-23: qty 1

## 2014-08-23 MED ORDER — SODIUM CHLORIDE 0.9 % IV SOLN: 100.0000 mL | INTRAVENOUS | Status: DC | PRN

## 2014-08-23 MED ORDER — NEPRO/CARBSTEADY PO LIQD: 237.0000 mL | ORAL | Status: DC | PRN

## 2014-08-23 MED ORDER — LIDOCAINE HCL (PF) 1 % IJ SOLN
5.0000 mL | INTRAMUSCULAR | Status: DC | PRN
  Filled 2014-08-23: qty 5

## 2014-08-23 MED ORDER — IPRATROPIUM BROMIDE 0.06 % NA SOLN
2.0000 | Freq: Two times a day (BID) | NASAL | Status: DC
  Administered 2014-08-23 – 2014-08-26 (×6): 2 via NASAL
  Filled 2014-08-23: qty 15

## 2014-08-23 MED ORDER — ALTEPLASE 2 MG IJ SOLR: 2.0000 mg | Freq: Once | INTRAMUSCULAR | Status: AC | PRN

## 2014-08-23 MED ORDER — NOREPINEPHRINE BITARTRATE 1 MG/ML IV SOLN
0.0000 ug/min | INTRAVENOUS | Status: DC
  Administered 2014-08-23: 5 ug/min via INTRAVENOUS
  Filled 2014-08-23: qty 16

## 2014-08-23 MED ORDER — NOREPINEPHRINE 4 MG/250ML-% IV SOLN
0.0000 ug/min | INTRAVENOUS | Status: DC
  Administered 2014-08-23: 20 ug/min via INTRAVENOUS

## 2014-08-23 MED ORDER — PENTAFLUOROPROP-TETRAFLUOROETH EX AERO
1.0000 | INHALATION_SPRAY | CUTANEOUS | Status: DC | PRN
Start: 2014-08-23 — End: 2014-08-26

## 2014-08-23 MED ORDER — VANCOMYCIN HCL 1000 MG IV SOLR
1500.0000 mg | INTRAVENOUS | Status: DC
  Filled 2014-08-23 (×2): qty 1500

## 2014-08-23 NOTE — Progress Notes (Signed)
Pt has been afebrile this shift. Has episode of tube feeding come up and was suctioned and cleaned up. md made aware tube feeds stopped till am. No changes in sedation made as patient awake easily when aroused by touch. No other changes at this time.

## 2014-08-23 NOTE — H&P (Addendum)
PATIENT NAME:  Patrick Downs, BRUMLOW MR#:  [id number] DATE OF BIRTH:  01/10/1976  DATE OF ADMISSION:  08/05/2014  PRIMARY DOCTOR: Leonel Ramsay, MD   EMERGENCY ROOM PHYSICIAN:  Conni Slipper, MD    CHIEF COMPLAINT: Intractable cough, fever, and chills.   HISTORY OF PRESENT ILLNESS: This is a 39 year old obese male brought in because of cough, fever, body aches. He went to dialysis today and he received almost but last 40 minutes of HD, where he had severe nonproductive cough, body aches, fever, and temperature was 101.6 Fahrenheit, so patient came to the Emergency Room because he could not get an appointment with his primary doctor. Patient was tachycardic, heart rate up to 138, and he says that he has been having dry cough since yesterday associated with body aches and pleuritic chest pain secondary to coughing. No shortness of breath and denies any phlegm. No nausea. No vomiting. No diarrhea. No other complaints. Complains of just generalized body pains. The patient's flu test has been negative.   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   PAST SURGICAL HISTORY: Significant for parathyroid operation.   MEDICATIONS: At home, Percocet 5/325 every 4 to 6 hours as needed, calcium acetate 667 mg 3 capsules t.i.d., Neurontin 300 mg p.o. daily, omeprazole 40 mg daily,  oxycodone 5 mg every 6 hours.   FAMILY HISTORY: Jon Gills has dialysis, and grandfather had kidney problems.   PAST SURGICAL HISTORY: Significant for history of multiple AV shunt repairs, AV shunt placement, and thyroid operation.   REVIEW OF SYSTEMS:  CONSTITUTIONAL: He has fever, fatigue, dry cough.  EYES: No blurred vision.  EARS, NOSE, AND THROAT: No tinnitus. No epistaxis. No difficulty swallowing.  RESPIRATORY: Has dry cough since yesterday, no wheezing, no hemoptysis, no COPD.   CARDIOVASCULAR: Has chest pain secondary to cough; but no nausea, no vomiting, no diaphoresis.  GENITOURINARY: He is on dialysis.  ENDOCRINE: No polyuria or nocturia.  HEMATOLOGIC: No anemia.  INTEGUMENTARY: No skin rashes.  MUSCULOSKELETAL: The patient has weakness in the legs secondary to Guillain-Barre and he still has paresis.  PSYCHIATRIC: No anxiety or insomnia.   PHYSICAL EXAMINATION: VITAL SIGNS: Temperature 101.6, heart rate 138, blood pressure 88/68, sats 100% on room air, the patient is asymptomatic. He denies any complaints except his dry cough.  GENERAL: The patient is a 39 year old African American male having lots of dry cough but not in distress, able to talk full sentences.  HEAD: Normocephalic, atraumatic.  EYES:  Pupils equal, reacting to light, extraocular movements are intact.  EARS, NOSE, AND THROAT: No tympanic membrane congestion, no turbinate hypertrophy, no oropharyngeal erythema.   NECK: Supple. No JVD. No carotid bruit. No lymphadenopathy.  CARDIOVASCULAR: S1, S2 regular, tachycardic, no murmurs; patient's PMI is not displaced, peripheral pulses intact at carotid, femoral,, and dorsalis pedis.  LUNGS: Bilaterally breath sounds present, faint expiratory wheeze in all lung fields, not using accessory muscles of respiration.  GASTROINTESTINAL: Abdomen is soft, nontender, nondistended, bowel sounds present; patient has no organomegaly, no hernias.  EXTREMITIES: No extremity edema. No cyanosis, no clubbing.  NEUROLOGIC: The patient cranial nerves II through XII intact, power 5 out of 5 upper extremities, lower extremities; power is diminished because of his GB syndrome,, he still wears braces for his ambulation.  SKIN: Inspection is normal.  LYMPHATIC: No lymphadenopathy.  PSYCHIATRIC: Mood and affect are within normal limits.  DIAGNOSTIC DATA: WBC 5.6, hemoglobin 12.3, hematocrit 37.4, platelets 121,000. Chest x-ray shows vascular congestion without overt  interstitial edema, no effusions. Electrolytes: Sodium is 137, potassium 4.6, chloride 95, bicarbonate 27, BUN 31, creatinine 10, glucose 91. Patient's flu test is negative, magnesium 1.9, phosphorus 2.7; patient's EKG shows sinus tachycardia with no ST-T changes.   ASSESSMENT AND PLAN: 1.  This patient is a 39 year old male patient with ESRD on hemodialysis with the cough, fever, and lactic acidosis. The patient has sepsis, the source is unclear, possibly has atypical pneumonia. His flu test has been negative, so blood cultures to be followed, empirically continue him on Rocephin and Zithromax and his arteriovenous fistula is not looking infected. Patient already received Azactam, azithromycin. This patient will get hydration up to 1 liter, clinically he looks dry, so we are going to do IV fluids but he is hypotensive, but he says that recently his blood pressure has been running in the low 90s, but he is asymptomatic; continue IV fluids up to 1 liter. He is tachycardic, probably secondary to his severe cough. The patient has end stage renal disease on hemodialysis, consult nephrology for his hemodialysis needs.  2.  Neuropathy. Continue Neurontin.  3.  History of Guillain-Barre syndrome with residual weakness in the legs, chronic.   Time spent is 55 minutes.    ____________________________ Epifanio Lesches, MD sk:nt D: 08/05/2014 20:59:53 ET T: 08/05/2014 21:39:57 ET JOB#: [id number]  cc: Epifanio Lesches, MD, <Dictator> Epifanio Lesches MD ELECTRONICALLY SIGNED 08/25/2014 23:38

## 2014-08-23 NOTE — Op Note (Signed)
PATIENT NAME:  Patrick Downs, Patrick Downs MR#:  [id number] DATE OF BIRTH:  Mar 06, 1976  DATE OF PROCEDURE:  08/10/2014  PREOPERATIVE DIAGNOSES:  1.  Sepsis.  2.  End-stage renal disease requiring hemodialysis.   POSTOPERATIVE DIAGNOSIS:  1.  Sepsis.  2.  End-stage renal disease requiring hemodialysis.   PROCEDURE PERFORMED:  Insertion of right internal jugular triple-lumen catheter with ultrasound guidance.   SURGEON: Katha Cabal, M.D.   INDICATION: Mr. Arnell has been emergently transferred to the intensive care unit hypotensive and with signs consistent with sepsis.  He does not have adequate IV access to receive the multiple parenteral lifesaving medications and he is therefore undergoing insertion of a triple lumen catheter.  Risks and benefits have been reviewed.  The patient agrees to proceed.   DESCRIPTION OF PROCEDURE:  The patient is positioned supine and his right neck is prepped and draped in sterile fashion.  Ultrasound is placed in a sterile sleeve.  Jugular vein is identified.  It is echolucent and compressible indicating patency.  Images are recorded for the permanent record.  1% lidocaine is infiltrated in the soft tissues and access to the jugular vein is obtained with a Seldinger needle.  A J-wire is then advanced, dilator passed over the wire, and the triple-lumen catheter advanced without difficulty.  All three lumens aspirate and flush easily and the catheter is secured to the skin of the neck with 2-0 silk suture.  Sterile dressing with a Biopatch is applied.  The patient tolerated the procedure well.  Chest x-ray demonstrates line in good position.  No hemopneumothorax.   ____________________________ Katha Cabal, MD ggs:[id number] D: 08/10/2014 20:31:08 ET T: 08/10/2014 21:13:50 ET JOB#: [id number]  cc: Katha Cabal, MD, <Dictator> Katha Cabal MD ELECTRONICALLY SIGNED 08/11/2014 12:56

## 2014-08-23 NOTE — Progress Notes (Signed)
Patient ID: THOREN KOBY, male   DOB: 1976/01/08, 39 y.o.   MRN: [id number] [facility] PROGRESS NOTE  LUCKAS PORRO [id number] DOB: May 12, 1975 DOA: 08/05/2014 PCP: No primary care provider on file.  HPI/Subjective: Patient is intubated and sedated. As per the nurse the patient did vomit last night and needed to be hooked up to suction. As per the patient's mother he is more responsive.  Objective: Filed Vitals:   08/23/14 1200  BP: 87/48  Pulse: 99  Temp:   Resp:     Intake/Output Summary (Last 24 hours) at 08/23/14 1414 Last data filed at 08/23/14 1412  Gross per 24 hour  Intake 887.45 ml  Output   3300 ml  Net -2412.55 ml   Filed Weights   08/21/14 1357  Weight: 119.205 kg (262 lb 12.8 oz)    ROS: ROS unable to obtain secondary to being intubated and sedated. Exam: Physical Exam  HENT:  Nose: No mucosal edema.  Mouth/Throat: No oropharyngeal exudate or posterior oropharyngeal edema.  Eyes: Conjunctivae, EOM and lids are normal. Pupils are equal, round, and reactive to light.  Neck: No JVD present. Carotid bruit is not present. No edema present. No thyroid mass and no thyromegaly present.  Cardiovascular: S1 normal and S2 normal.  Exam reveals no gallop.   No murmur heard. Pulses:      Dorsalis pedis pulses are 2+ on the right side, and 2+ on the left side.  Respiratory: No respiratory distress. He has no wheezes. He has no rhonchi. He has no rales.  GI: Soft. Bowel sounds are normal. There is no tenderness.  Lymphadenopathy:    He has no cervical adenopathy.  Skin: Skin is warm. No rash noted. Nails show no clubbing.   neurologic and psychiatric: Unable to test secondary to being sedated. Data Reviewed: Basic Metabolic Panel:  Recent Labs Lab 08/17/14 1502 08/19/14 0616 08/20/14 0417 08/22/14 0653 08/23/14 0725  NA  --   --   --   --  135  K  --   --   --   --  5.2*  CL  --   --   --   --  94*  CO2 29 27 28 27 27   GLUCOSE  --   --    --   --  149*  BUN 61* 82* 64* 71* 81*  CREATININE 9.60* 11.58* 8.67* 8.87* 10.52*  CALCIUM 7.5* 6.9* 6.9* 7.1* 6.7*  MG  --   --   --   --  2.6*  PHOS  --   --   --   --  7.1*   Liver Function Tests:  Recent Labs Lab 08/17/14 1502 08/19/14 0616 08/23/14 0725  AST  --  61* 68*  ALT  --  63 58  ALKPHOS  --  57 79  BILITOT  --   --  0.7  PROT  --  SEE COMMENT 6.4*  ALBUMIN 2.4* 2.1* 2.0*   CBC:  Recent Labs Lab 08/17/14 1502 08/19/14 0616 08/20/14 0417 08/22/14 0653 08/23/14 0725  WBC 9.6 9.3 8.9 8.4 10.5  NEUTROABS 7.6* 7.8* 7.4* 5.7 7.5*  HGB 8.6* 7.7* 8.1* 7.7* 7.0*  HCT 25.4* 22.8* 24.0* 23.9* 21.7*  MCV 91 92 91 92 91.0  PLT 243 208 245 195 218        Studies: Dg Abd 1 View  08/23/2014   CLINICAL DATA:  Abdominal distention  EXAM: ABDOMEN - 1 VIEW  COMPARISON:  August 18, 2014  FINDINGS: There is air in the stomach. There is a paucity of gas in the small and large bowel. No free air is seen. There are vascular calcifications in the pelvis.  IMPRESSION: Paucity of small bowel and large bowel gas. This finding may be seen normally. However, it may also be seen as a consequence of enteritis or early ileus. Obstruction not felt to be likely. No apparent free air.   Electronically Signed   By: Lowella Grip III M.D.   On: 08/23/2014 13:07   US Venous Img Lower Bilateral  08/22/2014   CLINICAL DATA:  Persistent fevers, anticoagulation therapy.  EXAM: BILATERAL LOWER EXTREMITY VENOUS DOPPLER ULTRASOUND  TECHNIQUE: Gray-scale sonography with compression, as well as color and duplex ultrasound, were performed to evaluate the deep venous system from the level of the common femoral vein through the popliteal and proximal calf veins.  COMPARISON:  None  FINDINGS: Normal compressibility of the common femoral, superficial femoral, and popliteal veins, as well as the proximal calf veins. No filling defects to suggest DVT on grayscale or color Doppler imaging. Doppler waveforms  show normal direction of venous flow, normal respiratory phasicity and response to augmentation.  IMPRESSION: 1. No evidence of lower extremity deep vein thrombosis, bilaterally.   Electronically Signed   By: Lucrezia Europe M.D.   On: 08/22/2014 13:38   US Doppler Up Extr  Bilateral ([facility] Hx)  08/22/2014   CLINICAL DATA:  Upper extremity edema  EXAM: BILATERAL UPPER EXTREMITY VENOUS DUPLEX ULTRASOUND  TECHNIQUE: Gray-scale sonography with graded compression, as well as color Doppler and duplex ultrasound were performed to evaluate the bilateral upper extremity deep venous systems from the level of the subclavian vein and including the jugular, axillary, basilic, radial, ulnar and upper cephalic vein. Spectral Doppler was utilized to evaluate flow at rest and with distal augmentation maneuvers.  COMPARISON:  None.  FINDINGS: RIGHT UPPER EXTREMITY  Internal Jugular Vein: No evidence of thrombus. Normal compressibility, respiratory phasicity and response to augmentation.  Subclavian Vein: No evidence of thrombus. Normal compressibility, respiratory phasicity and response to augmentation.  Axillary Vein: No evidence of thrombus. Normal compressibility, respiratory phasicity and response to augmentation.  Cephalic Vein: No evidence of thrombus. Normal compressibility, respiratory phasicity and response to augmentation.  Basilic Vein: No evidence of thrombus. Normal compressibility, respiratory phasicity and response to augmentation.  Brachial Veins: No evidence of thrombus. Normal compressibility, respiratory phasicity and response to augmentation.  Radial Veins: No evidence of thrombus. Normal compressibility, respiratory phasicity and response to augmentation.  Ulnar Veins: No evidence of thrombus. Normal compressibility, respiratory phasicity and response to augmentation.  Venous Reflux:  None.  Other Findings:  None.  LEFT UPPER EXTREMITY  Internal Jugular Vein: No evidence of thrombus. Normal compressibility,  respiratory phasicity and response to augmentation.  Subclavian Vein: No evidence of thrombus. Normal compressibility, respiratory phasicity and response to augmentation.  Axillary Vein: No evidence of thrombus. Normal compressibility, respiratory phasicity and response to augmentation.  Cephalic Vein: No evidence of thrombus. Normal compressibility, respiratory phasicity and response to augmentation.  Basilic Vein: No evidence of thrombus. Normal compressibility, respiratory phasicity and response to augmentation.  Brachial Veins: No evidence of thrombus. Normal compressibility, respiratory phasicity and response to augmentation.  Radial Veins: No evidence of thrombus. Normal compressibility, respiratory phasicity and response to augmentation.  Ulnar Veins: No evidence of thrombus. Normal compressibility, respiratory phasicity and response to augmentation.  Venous Reflux:  None.  Other Findings: There is evidence of a thrombosed dialysis fistula in  the left upper arm region. By report, this fistula is chronic and no longer used clinically.  IMPRESSION: No evidence of deep venous thrombosis in either upper extremity. Thrombosed left upper extremity fistula which by report has not been used for many years.   Electronically Signed   By: Lowella Grip III M.D.   On: 08/22/2014 14:03   Ct Lung Screening  08/21/2014   CLINICAL DATA:  Fever and dry cough for 2 weeks. Increasing body aches with pleuritic chest pain. Evaluate for pneumonia or empyema. contrast allergy. Initial encounter.  EXAM: CT CHEST WITHOUT CONTRAST  TECHNIQUE: Multidetector CT imaging of the chest was performed following the standard protocol without IV contrast.  COMPARISON:  None.  FINDINGS: Mediastinum/Nodes: No enlarged mediastinal, hilar or axillary lymph nodes identified. Hilar assessment is limited by the lack of intravenous contrast and adjacent airspace opacities.The patient is intubated. There is a right IJ central venous catheter with  its tip in the upper right atrium. Nasogastric tube extends into the stomach. The heart size is normal. There is no pericardial effusion.Dense coronary artery calcifications again noted. Left subclavian vascular stent noted.  Lungs/Pleura: As demonstrated on the recent radiographs, there is extensive bilateral airspace disease which is new compared with the CT of 2 weeks ago. There confluent components in both lung bases with associated air bronchograms. There is minimal if any dependent pleural fluid bilaterally and no evidence of empyema. Underlying parenchymal calcifications are present in both lungs.  Upper abdomen:  Unremarkable.  There is no adrenal mass.  Musculoskeletal/Chest wall: No chest wall mass or acute osseous findings demonstrated. There is mild generalized soft tissue edema. The bones appear somewhat sclerotic, likely related to renal osteodystrophy.  IMPRESSION: 1. Extensive bilateral airspace opacities consistent with pneumonia or ARDS. 2. Minimal if any pleural fluid.  No evidence of empyema. 3. Underlying parenchymal calcifications throughout both lungs, likely due to previous granulomatous disease. 4. Renal osteodystrophy.   Electronically Signed   By: Richardean Sale M.D.   On: 08/21/2014 17:36    Scheduled Meds: . ALPRAZolam  1 mg Oral 4 times per day  . budesonide (PULMICORT) nebulizer solution  0.5 mg Nebulization Q12H  . calcium acetate  2,001 mg Oral TID WC  . calcium-vitamin D  1 tablet Oral BID WC  . chlorhexidine  5 mL Mouth/Throat Q12H  . docusate  100 mg Oral Q12H  . famotidine  20 mg Oral QHS  . feeding supplement (PRO-STAT SUGAR FREE 64)  60 mL Oral TID  . fluconazole (DIFLUCAN) IV  400 mg Intravenous Q48H  . heparin subcutaneous  5,000 Units Subcutaneous Q12H  . ipratropium-albuterol  3 mL Nebulization Q4H  . meropenem (MERREM) IV  500 mg Intravenous Q24H  . multivitamin  1 tablet Oral QHS  . oxyCODONE  10 mg Oral 6 times per day  . QUEtiapine  100 mg Oral Q12H   . scopolamine  1 patch Transdermal Q72H  . sennosides  10 mL Oral Q12H  . sodium chloride  5 mL Intravenous Daily   Continuous Infusions: . dexmedetomidine    . feeding supplement (NEPRO CARB STEADY)    . fentaNYL 400 mcg/hr (08/23/14 1313)  . midazolam (VERSED) 50mg  in NS 25mL (1mg /ml) premix infusion 10 mg/hr (08/23/14 1412)  . norepinephrine (LEVOPHED) Adult infusion 7 mcg/min (08/23/14 1300)  . vasopressin (PITRESSIN) infusion - *FOR SHOCK* 0.03 Units/min (08/23/14 [id number])    Assessment/Plan: #1 septic shock with bilateral pneumonia and ARDS with fever -Antibiotics as per infectious disease -  Supportive care for ARDS -Currently on 2 pressors to maintain blood pressure -High risk for cardiopulmonary arrest #2 acute respiratory failure due to bilateral pneumonia and ARDS -Continue full ventilator support over the weekend -FiO2 today down to 40% #3 acute encephalopathy due to sepsis #4 pancytopenia -Hemoglobin drifting down to 7.0 will get a type and cross for tomorrow, likely will need a transfusion -And neutropenia and thrombocytopenia improved #5 neuropathy -Continue Neurontin #6 end-stage renal disease -Continue hemodialysis as per nephrology #7 vomiting -Possible ileus. Hold tube feeding for right now. Clinically observe. -The mother thinks it could be nasal drip, will start ipratropium nasal spray.   Code Status:     Code Status Orders        Start     Ordered   08/22/14 1435  Full code   Continuous     08/22/14 1457     Family Communication: Spoke with mother at the bedside Disposition Plan: To be determined.  Time spent: 35 minutes critical care time  [name]  [facility]

## 2014-08-23 NOTE — Consult Note (Signed)
Referring Physician:  Epifanio Lesches :   Primary Care Physician:  Epifanio Lesches : [facility], [facility], [address] [place], [place] [address]  Reason for Consult: Admit Date: 06-Aug-2014  Chief Complaint: confusion and fever  Reason for Consult: confusion   History of Present Illness: History of Present Illness:   seen at request of Dr. Posey Pronto for possible seizure activity and confusion;   39 yo RHD M presents to [facility] with confusion and cough.  Pt was noted to have a high fever that has not responded to therapy.  Pt was noted also to have worsening chest CT as well.  There are some new jerks noted by primary and nursing as well.  The primary was also concerned for possible CNS infection as source.  Pt denies headaches on neck pain.  Per nursing he has had some fluctuations in mental status that usually coorelate with fever.  ROS:  General fever  fatigue   HEENT no complaints   Lungs cough  SOB   Cardiac no complaints   GI no complaints   GU no complaints   Musculoskeletal no complaints   Extremities no complaints   Skin no complaints   Neuro no complaints   Endocrine no complaints   Psych no complaints   Past Medical/Surgical Hx:  Dialysis Catheter:   Dialysis:   Guillian Barre syndrome:   neuropathy lower extremities:   Gallstones:   renal failure:   htn:   Laser eye surgery:   fistula right arm:   av fistula:   Tonsillectomy:   parathyroid  removed:   Past Medical/ Surgical Hx:  Past Medical History reviewed by me as above   Past Surgical History reviewed by me as above   Home Medications: Medication Instructions Last Modified Date/Time  acetaminophen-HYDROcodone 325 mg-5 mg oral tablet 1-2 tab(s) by mouth every 4-6 hours as needed for severe pain. 15-Jan-16 21:20  omeprazole 40 mg oral delayed release capsule 1 cap(s) orally once a day 09-May-15 15:38  calcium acetate 667 mg oral capsule 3  cap(s) orally 3 times a day 09-May-15 15:38  Doc-Q-Lace sodium 100 mg oral capsule 1 cap(s) orally 2 times a day 09-May-15 15:38  gabapentin 300 mg oral capsule 300 milligram(s) orally once a day (at bedtime) 09-May-15 15:38  oxyCODONE 5 mg oral tablet 1 tab(s) orally every 6 hours 09-May-15 15:38   Allergies:  Minoxidil: Other  IVP Dye: Unknown  Morphine: Unknown  Omnipaque 140: Unknown  Allergies:  Allergies reviewed by me as above   Social/Family History: Employment Status: disabled  Lives With: alone  Living Arrangements: apartment  Social History: no tob, no EtOh, no illicits  Family History: no seizures, no stroke, no muscle problems   Vital Signs: **Vital Signs.:   19-Apr-16 14:00  Temperature Temperature (F) 101.5  Celsius 38.6  Temperature Source oral   Physical Exam: General: nl weight, mildly ill-appearing  HEENT: normocephalic, sclera nonicteric, oropharynx clear , male pattern balding  Neck: supple, no JVD, no bruits;  -  Kernig and - Brudinski  Chest: coarse BS B with wheezing, cough  Cardiac: tachycardic, no murmur, good pulses  Extremities: no C/C/E, FROM   Neurologic Exam: Mental Status: alert and oriented x 3, normal speech and language, follows complex commands  Cranial Nerves: PERRLA, EOMI, nl VF, face symmetric, tongue midline, shoulder shrug equal  Motor Exam: 4/5 B, atrophy in B hands with contractures, mild myoclonus noted  Deep Tendon Reflexes: 0/4 B, mute plantars  B  Sensory Exam: slightly decreased vibration B, nl temp and pin B   Lab Results: Thyroid:  13-Apr-16 18:41   Thyroid Stimulating Hormone 3.660 (0.350-4.500 NOTE: New Reference Range  06/30/14)  Hepatic:  13-Apr-16 18:41   Bilirubin, Total 0.5 (0.3-1.2 NOTE: New Reference Range  06/30/14)  Alkaline Phosphatase 51 (38-126 NOTE: New Reference Range  06/30/14)  SGPT (ALT) 18 (17-63 NOTE: New Reference Range  06/30/14)  SGOT (AST) 35 (15-41 NOTE: New Reference Range   06/30/14)  Total Protein, Serum 7.8 (6.5-8.1 NOTE: New Reference Range  06/30/14)  18-Apr-16 10:04   Albumin, Serum 3.5 (3.5-5.0 NOTE: New reference range  06/30/14)  TDMs:  17-Apr-16 10:23   Vancomycin, Random 23 (Result(s) reported on 09 Aug 2014 at 11:14AM.)  Routine Micro:  18-Apr-16 18:43   Micro Text Report BLOOD CULTURE   COMMENT                   NO GROWTH IN 8-12 HOURS   ANTIBIOTIC                       Culture Comment NO GROWTH IN 8-12 HOURS  Result(s) reported on 11 Aug 2014 at 03:00AM.  General Ref:  15-Apr-16 12:49   Parathyroid Hormone, Intact ========== TEST NAME ==========  ========= RESULTS =========  = REFERENCE RANGE =  PTH INTACT  PTH, Intact PTH, Intact                     [   34 pg/mL             ]             [address]               [facility]            No: [phone]           [address] [place], [place] [address]           Lindon Romp, MD         [phone]   Result(s) reported on 08 Aug 2014 at 07:49AM.  Routine Chem:  13-Apr-16 17:49   Magnesium, Serum 1.9 (1.7-2.4 THERAPEUTIC RANGE: 4-7 mg/dL TOXIC: > 10 mg/dL  ----------------------- NOTE: New Reference Range  06/30/14)  18-Apr-16 05:40   Lactic Acid  Venous 0.9 (0.5-2.0 NOTE: New Reference Range:  06/30/14)    10:04   Phosphorus, Serum  6.4 (2.5-4.6 NOTE: New Reference Range  06/30/14)  19-Apr-16 04:13   Amylase, Serum  176 (28-100 NOTE: New Reference Range  06/30/14)  Lipase  104 (22-51 NOTE: New Reference Range  06/30/14)  Result Comment WBC - RESULTS VERIFIED BY REPEAT TESTING.  - CRITICAL VALUE PREVIOUSLY NOTIFIED. LABS - This specimen was collected through an   - indwelling catheter or arterial line.  - A minimum of 22ms of blood was wasted prior    - to collecting the sample.  Interpret  - results with caution.  Result(s) reported on 11 Aug 2014 at 07:42AM.  Glucose, Serum  108 (65-99 NOTE: New Reference Range  06/30/14)  BUN  58 (6-20 NOTE:  New Reference Range  06/30/14)  Creatinine (comp)  16.11 (0.61-1.24 NOTE: New Reference Range  06/30/14)  Sodium, Serum 137 (135-145 NOTE: New Reference Range  06/30/14)  Potassium, Serum 4.3 (3.5-5.1 NOTE: New Reference Range  06/30/14)  Chloride, Serum  94 (101-111 NOTE: New Reference Range  06/30/14)  CO2, Serum  28 (22-32 NOTE: New Reference Range  06/30/14)  Calcium (Total), Serum  7.2 (8.9-10.3 NOTE: New Reference Range  06/30/14)  Anion Gap 15  eGFR (African American)  4  eGFR (Non-African American)  3 (eGFR values <88m/min/1.73 m2 may be an indication of chronic kidney disease (CKD). Calculated eGFR is useful in patients with stable renal function. The eGFR calculation will not be reliable in acutely ill patients when serum creatinine is changing rapidly. It is not useful in patients on dialysis. The eGFR calculation may not be applicable to patients at the low and high extremes of body sizes, pregnant women, and vegetarians.)  Cardiac:  13-Apr-16 18:41   Troponin I <0.03 (0.00-0.03 0.03 ng/mL or less: NEGATIVE  Repeat testing in 3-6 hrs  if clinically indicated. >0.05 ng/mL: POTENTIAL  MYOCARDIAL INJURY. Repeat  testing in 3-6 hrs if  clinically indicated. NOTE: An increase or decrease  of 30% or more on serial  testing suggests a  clinically important change NOTE: New Reference Range  06/30/14)  Routine Sero:  19-Apr-16 10:17   Mono Test NEGATIVE (The test is a rapid qualitative test for the serological detection of heterophile antibodies associated with infectious mononucleosis. Some segments of the population do not produce detectable heterophile antibody, e.g., approximately 50% of children under 434years of age  and 10% of adolescents.  Detectable levels of heterophile antibody may persist for months, and rarely for years, in some individuals. Positive heterophile tests have also been reported with pathological conditions other than  mononucleosis. Results should be correlated with clinical and hematological findings.)  Routine Coag:  13-Apr-16 17:49   Prothrombin 14.3 (11.4-15.0 NOTE: New Reference Range  05/22/14)  INR 1.1 (INR reference interval applies to patients on anticoagulant therapy. A single INR therapeutic range for coumarins is not optimal for all indications; however, the suggested range for most indications is 2.0 - 3.0. Exceptions to the INR Reference Range may include: Prosthetic heart valves, acute myocardial infarction, prevention of myocardial infarction, and combinations of aspirin and anticoagulant. The need for a higher or lower target INR must be assessed individually. Reference: The Pharmacology and Management of the Vitamin K  antagonists: the seventh ACCP Conference on Antithrombotic and Thrombolytic Therapy. CGGYIR.4854Sept:126 (3suppl): [id number] A HCT value >55% may artifactually increase the PT.  In one study,  the increase was an average of 25%. Reference:  "Effect on Routine and Special Coagulation Testing Values of Citrate Anticoagulant Adjustment in Patients with High HCT Values." American Journal of Clinical Pathology 2006;126:400-405.)  Routine Hem:  19-Apr-16 04:13   WBC (CBC)  1.6  RBC (CBC)  3.58  Hemoglobin (CBC)  10.6  Hematocrit (CBC)  33.2  Platelet Count (CBC)  68  MCV 93  MCH 29.6  MCHC  31.9  RDW  16.7  Neutrophil % 60.9  Lymphocyte % 28.5  Monocyte % 10.0  Eosinophil % 0.2  Basophil % 0.4  Neutrophil #  1.0  Lymphocyte #  0.5  Monocyte # 0.2  Eosinophil # 0.0  Basophil # 0.0   Radiology Impression: Radiology Impression: CT of chest reviewed by me and shows increases consolidation B   Impression/Recommendations: Recommendations:   prior notes reviewed by me reviewed by me d/w Dr. FOla Spurrand Dr. 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 which is likely secondary to increasing uremia Encephalopathy-  fluctuating as this disease normally does;   this is likely related to underlying medical issues such as uremia, possible infection, etc Fever-  this does  not appear to be neurologic in origin but could be autoimmune Hx of Guillane Barre-  no new weakness and improvement since 2012 per pt but pt has distinct atrophy that looks more like a muscular dystrophy pattern to me;  no obvious myotonia no need for LP from neuro standpoint start VPA 248m q8h IV for myoclonus check CPK, ESR, CRP, ANA, procalcitonin, complement levels continue current antibioitics per ID would consider changing dialysis regimen per Renal to help drop BUN would benefit from outpatient w/u of weakness too will follow  Electronic Signatures: SJamison Neighbor(MD)  (Signed 19-Apr-16 15:29)  Authored: REFERRING PHYSICIAN, Primary Care Physician, Consult, History of Present Illness, Review of Systems, PAST MEDICAL/SURGICAL HISTORY, HOME MEDICATIONS, ALLERGIES, Social/Family History, NURSING VITAL SIGNS, Physical Exam-, LAB RESULTS, RADIOLOGY RESULTS, Recommendations   Last Updated: 19-Apr-16 15:29 by SJamison Neighbor(MD)

## 2014-08-23 NOTE — Progress Notes (Signed)
All overdue documentation is in scm [id number]

## 2014-08-23 NOTE — Progress Notes (Signed)
Md returned call per Dr. Jannifer Franklin agreed with plan of care to have pt on LWS and let Daytime MD re-evaluate tube feeding.

## 2014-08-23 NOTE — Progress Notes (Signed)
PULMONARY / CRITICAL CARE MEDICINE   Name: Patrick Downs MRN: [id number] DOB: [birth year]    ADMISSION DATE:  08/05/2014   SUBJECTIVE:  patient with  hypoxic resp failure, patient with progressive b/l infiltrates on CXR, I have attempted SAT/SBt yesterday and patient with increased WOB and agitation with diaphoresis,  remains intubated,sedated, plan to wean fio2 and PEEp as tolerated,  patient remains critically ill, patient with febrile episodes, CT Chest with ARDS appearance. may need another bronch. Had a large amount gastric residual, TF held.   VITAL SIGNS: Temp:  [99.3 F (37.4 C)] 99.3 F (37.4 C) (05/01 0700) Pulse Rate:  [101-114] 101 (05/01 0900) Resp:  [14-25] 25 (05/01 0900) BP: (98-110)/(65-77) 110/77 mmHg (05/01 0900) SpO2:  [100 %] 100 % (05/01 0900) FiO2 (%):  [50 %] 50 % (05/01 0944) HEMODYNAMICS:   VENTILATOR SETTINGS: Vent Mode:  [-] PRVC FiO2 (%):  [50 %] 50 % Set Rate:  [25 bmp] 25 bmp Vt Set:  [550 mL] 550 mL PEEP:  [8 cmH20] 8 cmH20 INTAKE / OUTPUT:  Intake/Output Summary (Last 24 hours) at 08/23/14 1017 Last data filed at 08/23/14 0600  Gross per 24 hour  Intake     25 ml  Output   2300 ml  Net  -2275 ml    PHYSICAL EXAMINATION:  GEN disheveled, critically ill appearing   HEENT pale conjunctivae, PERRL, orally intubated   NECK supple  No masses   CARD regular rate  no murmur  no thrills  No LE edema  tachy   VASCULAR ACCESS AV graft present   ABD hypoactive BS   LYMPH negative neck, negative axillae   EXTR negative edema   SKIN No rashes, skin turgor good   NEURO negative Babinski R/L, follows commands   PSYCH sedated   RESP no use of accessory muscles  rhonchi  on vent    LABS:  CBC  Recent Labs Lab 08/20/14 0417 08/22/14 0653 08/23/14 0725  WBC 8.9 8.4 10.5  HGB 8.1* 7.7* 7.0*  HCT 24.0* 23.9* 21.7*  PLT 245 195 218   Coag's No results for input(s): APTT, INR in the last 168 hours. BMET  Recent Labs Lab  08/19/14 0616 08/20/14 0417 08/22/14 0653  CO2 27 28 27   BUN 82* 64* 71*  CREATININE 11.58* 8.67* 8.87*   Electrolytes  Recent Labs Lab 08/19/14 0616 08/20/14 0417 08/22/14 0653  CALCIUM 6.9* 6.9* 7.1*   Sepsis Markers No results for input(s): LATICACIDVEN, PROCALCITON, O2SATVEN in the last 168 hours. ABG No results for input(s): PHART, PCO2ART, PO2ART in the last 168 hours. Liver Enzymes  Recent Labs Lab 08/17/14 1502 08/19/14 0616  AST  --  61*  ALT  --  63  ALKPHOS  --  57  ALBUMIN 2.4* 2.1*   Cardiac Enzymes No results for input(s): TROPONINI, PROBNP in the last 168 hours. Glucose No results for input(s): GLUCAP in the last 168 hours.  Imaging US Venous Img Lower Bilateral  08/22/2014   CLINICAL DATA:  Persistent fevers, anticoagulation therapy.  EXAM: BILATERAL LOWER EXTREMITY VENOUS DOPPLER ULTRASOUND  TECHNIQUE: Gray-scale sonography with compression, as well as color and duplex ultrasound, were performed to evaluate the deep venous system from the level of the common femoral vein through the popliteal and proximal calf veins.  COMPARISON:  None  FINDINGS: Normal compressibility of the common femoral, superficial femoral, and popliteal veins, as well as the proximal calf veins. No filling defects to suggest DVT on grayscale or color Doppler  imaging. Doppler waveforms show normal direction of venous flow, normal respiratory phasicity and response to augmentation.  IMPRESSION: 1. No evidence of lower extremity deep vein thrombosis, bilaterally.   Electronically Signed   By: Lucrezia Europe M.D.   On: 08/22/2014 13:38   US Doppler Up Extr  Bilateral ([facility] Hx)  08/22/2014   CLINICAL DATA:  Upper extremity edema  EXAM: BILATERAL UPPER EXTREMITY VENOUS DUPLEX ULTRASOUND  TECHNIQUE: Gray-scale sonography with graded compression, as well as color Doppler and duplex ultrasound were performed to evaluate the bilateral upper extremity deep venous systems from the level of the  subclavian vein and including the jugular, axillary, basilic, radial, ulnar and upper cephalic vein. Spectral Doppler was utilized to evaluate flow at rest and with distal augmentation maneuvers.  COMPARISON:  None.  FINDINGS: RIGHT UPPER EXTREMITY  Internal Jugular Vein: No evidence of thrombus. Normal compressibility, respiratory phasicity and response to augmentation.  Subclavian Vein: No evidence of thrombus. Normal compressibility, respiratory phasicity and response to augmentation.  Axillary Vein: No evidence of thrombus. Normal compressibility, respiratory phasicity and response to augmentation.  Cephalic Vein: No evidence of thrombus. Normal compressibility, respiratory phasicity and response to augmentation.  Basilic Vein: No evidence of thrombus. Normal compressibility, respiratory phasicity and response to augmentation.  Brachial Veins: No evidence of thrombus. Normal compressibility, respiratory phasicity and response to augmentation.  Radial Veins: No evidence of thrombus. Normal compressibility, respiratory phasicity and response to augmentation.  Ulnar Veins: No evidence of thrombus. Normal compressibility, respiratory phasicity and response to augmentation.  Venous Reflux:  None.  Other Findings:  None.  LEFT UPPER EXTREMITY  Internal Jugular Vein: No evidence of thrombus. Normal compressibility, respiratory phasicity and response to augmentation.  Subclavian Vein: No evidence of thrombus. Normal compressibility, respiratory phasicity and response to augmentation.  Axillary Vein: No evidence of thrombus. Normal compressibility, respiratory phasicity and response to augmentation.  Cephalic Vein: No evidence of thrombus. Normal compressibility, respiratory phasicity and response to augmentation.  Basilic Vein: No evidence of thrombus. Normal compressibility, respiratory phasicity and response to augmentation.  Brachial Veins: No evidence of thrombus. Normal compressibility, respiratory phasicity and  response to augmentation.  Radial Veins: No evidence of thrombus. Normal compressibility, respiratory phasicity and response to augmentation.  Ulnar Veins: No evidence of thrombus. Normal compressibility, respiratory phasicity and response to augmentation.  Venous Reflux:  None.  Other Findings: There is evidence of a thrombosed dialysis fistula in the left upper arm region. By report, this fistula is chronic and no longer used clinically.  IMPRESSION: No evidence of deep venous thrombosis in either upper extremity. Thrombosed left upper extremity fistula which by report has not been used for many years.   Electronically Signed   By: Lowella Grip III M.D.   On: 08/22/2014 14:03     ASSESSMENT / PLAN: 39 year old AAM  patient with ESRD on hemodialysis with the cough, fever, and lactic acidosis with acute metabolic encephlopathy with fevers with acute septic shock with progressive pneumonia and ARDS candidal albicans pneumonia   1.septic shock due to right ML pneumonia with progressive b/l infiltrates. with hypotension /tachycardia  -use vasopressors to keep MAP>60 -follow up ID recs-on antifungal therapy and meropenen -get AB\G and LA as needed  2.Pneumonia with Resp failure-failure to wean from vetn due to resp muscel fatigue and delerium -on vent support -s/p bronch -chest CT with progressive infiltrates bilateraly, ARDS appearing.  -consider repeat bronch  3.fevers-with encephalopathy - pneumonia  -follow up Id recs-antifungal therapy and meropenem -U\S of UE  with left UE thrombosed dialysis fistula - possibly chronic  4.ESRD on HD -follow up nephrology recs  5.respiratory failure-ARDS -continue full vent support -follow up abg and cxr as needed -wean fio2 and peep as tolerated -Low lung volume strategy  6.Delerium continue current meds -xanax -seroquel -oxycodone  7. Large amount of gastric residuals - TF held, will check KUB   Vilinda Boehringer, MD [facility] Pager [phone] (Please enter 7-digits)

## 2014-08-23 NOTE — Progress Notes (Signed)
Pt remains under full vent support.  Tube was repositioned today due to being at wrong measurement.  Pt had large BM today, continues to have copious secretions, scopolamine patch placed behind left ear.  Tube feedings on hold due to large amounts of stomach contents being suctioned from stomach.  No emesis this shift.  No meds were able to be weaned off.  Mother remains at bedside.   Sinus tach

## 2014-08-23 NOTE — Consult Note (Signed)
CHIEF COMPLAINT and HISTORY:  Subjective/Chief Complaint fever, cough   History of Present Illness Patient admitted to the hospital yesterday with a persistent cough and fever that would not go away for over a week.  He has been tested for flu.  So far his blood cultures have been negative.  His source has not been entirely well elucidated, and he did have a recent revision of his right arm AVF with a graft, so this was questioned as a possible source.  He reports no significant tenderness in the surgical site.  This has been used for dialysis, but not in the graft portion.  He reports no significant drainage or erythema there.  This was done about 3 weeks ago at [facility]. He has been on dialysis for years now.   PAST MEDICAL/SURGICAL HISTORY:  Past Medical History:   Dialysis Catheter:    Dialysis:    Guillian Barre syndrome:    neuropathy lower extremities:    Gallstones:    renal failure:    htn:    Laser eye surgery:    fistula right arm:    av fistula:    Tonsillectomy:    parathyroid  removed:   ALLERGIES:  Allergies:  Minoxidil: Other  IVP Dye: Unknown  Morphine: Unknown  Omnipaque 140: Unknown  HOME MEDICATIONS:  Home Medications: Medication Instructions Status  acetaminophen-HYDROcodone 325 mg-5 mg oral tablet 1-2 tab(s) by mouth every 4-6 hours as needed for severe pain. Active  omeprazole 40 mg oral delayed release capsule 1 cap(s) orally once a day Active  calcium acetate 667 mg oral capsule 3 cap(s) orally 3 times a day Active  Doc-Q-Lace sodium 100 mg oral capsule 1 cap(s) orally 2 times a day Active  gabapentin 300 mg oral capsule 300 milligram(s) orally once a day (at bedtime) Active  oxyCODONE 5 mg oral tablet 1 tab(s) orally every 6 hours Active   Family and Social History:  Family History Hypertension  father and grandfather on dialysis   Social History negative tobacco, negative ETOH   Place of Living Home  with wife   Review of Systems:   Subjective/Chief Complaint No TIA/stroke/seizure No heat or cold intolerance No dysuria/hematuria.  ESRD No blurry or double vision No tinnitus or ear pain No rashes or ulcer No suicidal ideation or psychosis No signs of bleeding or easy bruising Some cough and SOB with exertion No palpitations or chest pain No N/V/D or abdominal pain No joint pain or joint swelling.  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  The incision is healing well.  This is nontender.  THere is no erythema or drainage.  Arm has minimal swelling.   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  UE weakness is present   SKIN normal to palpation, skin turgor good   NEURO cranial nerves intact, follows commands, UE weakness present   PSYCH alert, A+O to time, place, person, good insight   LABS:  Laboratory Results: Thyroid:    13-Apr-16 18:41, Thyroid Stimulating  Hormone  Thyroid Stimulating Hormone 3.660  0.350-4.500  NOTE: New Reference Range   06/30/14  Hepatic:    13-Apr-16 18:41, Comprehensive Metabolic Panel  Bilirubin, Total 0.5  0.3-1.2  NOTE: New Reference Range   06/30/14  Alkaline Phosphatase 51  38-126  NOTE: New Reference Range   06/30/14  SGPT (ALT) 18  17-63  NOTE: New Reference Range   06/30/14  SGOT (AST) 35  15-41  NOTE: New Reference Range   06/30/14  Total Protein, Serum 7.8  6.5-8.1  NOTE: New Reference Range   06/30/14  Albumin, Serum 4.4  3.5-5.0  NOTE: New reference range   06/30/14  Routine Micro:     13-Apr-16 17:49, Influenza A + B Antigen ([facility])  Micro Text Report   INFLUENZA A+B ANTIGENS    COMMENT                   NEGATIVE FOR INFLUENZA A (ANTIGEN ABSENT)    COMMENT                   NEGATIVE FOR INFLUENZA B (ANTIGEN ABSENT)     ANTIBIOTIC  Comment 1..   NEGATIVE FOR INFLUENZA A (ANTIGEN ABSENT) A negative result does not exclude influenza.  Correlation with clinical impression is required.  Comment 2..   NEGATIVE FOR INFLUENZA B (ANTIGEN ABSENT)   Result(s) reported on 05 Aug 2014 at 06:59PM.    13-Apr-16 18:26, Blood Culture  Micro Text Report   BLOOD CULTURE    COMMENT                   NO GROWTH IN 8-12 HOURS     ANTIBIOTIC  Culture Comment   NO GROWTH IN 8-12 HOURS   Result(s) reported on 06 Aug 2014 at 02:00AM.    13-Apr-16 18:41, Blood Culture  Micro Text Report   BLOOD CULTURE    COMMENT                   NO GROWTH IN 8-12 HOURS     ANTIBIOTIC  Culture Comment   NO GROWTH IN 8-12 HOURS   Result(s) reported on 06 Aug 2014 at 02:00AM.    13-Apr-16 22:15, Blood Culture  Micro Text Report   BLOOD CULTURE    COMMENT                   NO GROWTH IN 8-12 HOURS     ANTIBIOTIC  Culture Comment   NO GROWTH IN 8-12 HOURS   Result(s) reported on 06 Aug 2014 at 06:00AM.  Routine Chem:    13-Apr-16 17:49, Magnesium, Serum  Magnesium, Serum 1.9  1.7-2.4  THERAPEUTIC RANGE: 4-7 mg/dL  TOXIC: > 10 mg/dL   -----------------------  NOTE: New Reference Range   06/30/14    13-Apr-16 17:49, Phosphorus, Serum  Phosphorus, Serum 2.7  2.5-4.6  NOTE: New Reference Range   06/30/14    13-Apr-16 18:41, Comprehensive Metabolic Panel  Glucose, Serum 91  65-99  NOTE: New Reference Range   06/30/14  BUN 31  6-20  NOTE: New Reference Range   06/30/14  Creatinine (comp) 10.01  0.61-1.24  NOTE: New Reference Range   06/30/14  Sodium, Serum 137  135-145  NOTE: New Reference Range   06/30/14  Potassium, Serum 4.6  3.5-5.1  NOTE: New Reference Range   06/30/14   Chloride, Serum 95  101-111  NOTE: New Reference Range   06/30/14  CO2, Serum 27  22-32  NOTE: New Reference Range   06/30/14  Calcium (Total), Serum 10.2  8.9-10.3  NOTE: New Reference Range   06/30/14  eGFR (African American) 7  eGFR (Non-African American) 6  eGFR values <39m/min/1.73 m2 may be an indication of chronic  kidney disease (CKD).  Calculated eGFR is useful in patients with stable renal function.  The eGFR calculation will not be reliable in acutely ill patients  when serum creatinine is changing rapidly. It is not useful in  patients on dialysis. The eGFR calculation may not be applicable  to patients at the low and high extremes of body sizes, pregnant  women, and vegetarians.  Anion Gap 15    13-Apr-16 18:41, Lactic Acid  Venous  Result Comment   - RESULTS VERIFIED BY REPEAT TESTING   - NOTIFIED OF CRITICAL / READ-BACK PERFORMED   - [facility] CLafonda MossesRIVERA @ 2010 08/06/14   Result(s) reported on 05 Aug 2014 at 08:20PM.  Lactic Acid  Venous 4.3  0.5-2.0  NOTE: New Reference Range:   06/30/14    14-Apr-16 053:61 Basic Metabolic Panel (w/Total Calcium)  Glucose, Serum 91  65-99  NOTE: New Reference Range   06/30/14  BUN 40  6-20  NOTE: New Reference Range   06/30/14  Creatinine (comp) 11.31  0.61-1.24  NOTE: New Reference Range   06/30/14  Sodium, Serum 138  135-145  NOTE: New Reference Range   06/30/14  Potassium, Serum 4.8  3.5-5.1  NOTE: New Reference Range   06/30/14  Chloride, Serum 96  101-111  NOTE: New Reference Range   06/30/14  CO2, Serum 28  22-32  NOTE: New Reference Range   06/30/14  Calcium (Total), Serum 8.9  8.9-10.3  NOTE: New Reference Range   06/30/14  Anion Gap 14  eGFR (African American) 6  eGFR (Non-African American) 5  eGFR values <656mmin/1.73 m2 may be an indication of chronic  kidney disease (CKD).  Calculated eGFR is useful in patients with stable renal function.  The eGFR calculation will not be  reliable in acutely ill patients  when serum creatinine is changing rapidly. It is not useful in  patients on dialysis. The eGFR calculation may not be applicable  to patients at the low and high extremes of body sizes, pregnant  women, and vegetarians.    14-Apr-16 04:39, CBC Profile  Result Comment   cbc - SMEAR SCANNED   Result(s) reported on 06 Aug 2014 at 07:34AM.  Cardiac:    13-Apr-16 18:41, Troponin I  Troponin I <0.03  0.00-0.03  0.03 ng/mL or less: NEGATIVE   Repeat testing in 3-6 hrs   if clinically indicated.  >0.05 ng/mL: POTENTIAL   MYOCARDIAL INJURY. Repeat   testing in 3-6 hrs if   clinically indicated.  NOTE: An increase or decrease   of 30% or more on serial   testing suggests a   clinically important change  NOTE: New Reference Range   06/30/14  Routine Coag:    13-Apr-16 17:49, Prothrombin Time  Prothrombin 14.3  11.4-15.0  NOTE: New Reference Range   05/22/14  INR 1.1  INR reference interval applies to patients on anticoagulant therapy.  A single INR therapeutic range for coumarins is not optimal for all  indications; however, the suggested range for most indications is  2.0 - 3.0.  Exceptions to the INR Reference Range may include: Prosthetic heart  valves, acute myocardial infarction, prevention of myocardial  infarction, and combinations of aspirin and anticoagulant. The need  for a  higher or lower target INR must be assessed individually.  Reference: The Pharmacology and Management of the Vitamin K   antagonists: the seventh ACCP Conference on Antithrombotic and  Thrombolytic Therapy. ZESPQ.3300 Sept:126 (3suppl): [id number].  A HCT value >55% may artifactually increase the PT.  In one study,   the increase was an average of 25%.  Reference:  "Effect on Routine and Special Coagulation Testing Values  of Citrate Anticoagulant Adjustment in Patients with High HCT Values."  American Journal of Clinical Pathology 2006;126:400-405.  Routine Hem:     13-Apr-16 18:41, CBC Profile  WBC (CBC) 5.6  RBC (CBC) 4.05  Hemoglobin (CBC) 12.3  Hematocrit (CBC) 37.4  Platelet Count (CBC) 121  MCV 92  MCH 30.4  MCHC 32.9  RDW 16.5  Neutrophil % 82.1  Lymphocyte % 2.9  Monocyte % 13.5  Eosinophil % 0.9  Basophil % 0.6  Neutrophil # 4.6  Lymphocyte # 0.2  Monocyte # 0.8  Eosinophil # 0.1  Basophil # 0.0  Result(s) reported on 05 Aug 2014 at 07:02PM.    14-Apr-16 04:39, CBC Profile  WBC (CBC) 5.2  RBC (CBC) 3.47  Hemoglobin (CBC) 10.3  Hematocrit (CBC) 32.2  Platelet Count (CBC) 88  MCV 93  MCH 29.7  MCHC 31.9  RDW 16.6  Neutrophil % 66.9  Lymphocyte % 6.4  Monocyte % 26.0  Eosinophil % 0.3  Basophil % 0.4  Neutrophil # 3.5  Lymphocyte # 0.3  Monocyte # 1.4  Eosinophil # 0.0  Basophil # 0.0   RADIOLOGY:  Radiology Results: XRay:    15-Jan-16 16:06, Pelvis AP Only  Pelvis AP Only  REASON FOR EXAM:    bilateral severe hip/pelvis pain, worse on R than L,   no trauma hx but complicate  COMMENTS:       PROCEDURE: DXR - DXR PELVIS AP ONLY  - May 08 2014  4:06PM     CLINICAL DATA:  Bilateral pelvic pain, acute onset 2 days ago  without injury.    EXAM:  PELVIS - 1-2 VIEW    COMPARISON:  Bone window images from CT pelvis 06/06/2013.    FINDINGS:  No evidence of acute, subacute or healed fractures. Symmetric mild  superior joint space narrowing in both hips, unchanged. Sacroiliac  joints and symphysis pubis intact. Bone mineral density  well-preserved. Bone island in the right femoral neck, unchanged.  Calcification involving the left iliopsoas muscle tendon at an near  its insertion on the lesser trochanter of the left femur.    IMPRESSION:  1. No acute or subacute osseous abnormality.  2. Symmetric mild superior joint space narrowing in both hips.  3. Calcific tendinitis involving the left iliopsoas tendon at and  proximal to its insertion on the lesser trochanter.      Electronically Signed    By: Evangeline Dakin M.D.    On: 05/08/2014 16:20         Verified By: Deniece Portela, M.D.,    13-Apr-16 19:02, Chest Portable Single View  Chest Portable Single View  REASON FOR EXAM:    COUGH, FEVER  COMMENTS:       PROCEDURE: DXR - DXR PORTABLE CHEST SINGLE VIEW  - Aug 05 2014  7:02PM     CLINICAL DATA:  Cough, body ache and fever for 2 days.    EXAM:  PORTABLE CHEST - 1 VIEW    COMPARISON:  06/06/2013    FINDINGS:  The cardiac silhouette, mediastinal and hilar contours are within  normal limits and stable. There is vascular congestion without overt  mild interstitial edema. No definite pleural effusions or  infiltrates.     IMPRESSION:  Vascular congestion without overt interstitial edema. No infiltrates  or effusions.      Electronically Signed    By: Marijo Sanes M.D.    On: 08/05/2014 19:24         Verified By: Marlane Hatcher, M.D.,  [place]:    09-May-15 15:30, CT Head Without Contrast  PACS Image    15-Jan-16 16:06, Pelvis AP Only  PACS Image    13-Apr-16 19:02, Chest Portable Single View  PACS Image  CT:    09-May-15 15:30, CT Head Without Contrast  CT Head Without Contrast  REASON FOR EXAM:    DIZZY AND HEADACHE  COMMENTS:       PROCEDURE: CT  - CT HEAD WITHOUT CONTRAST  - Aug 30 2013  3:30PM     CLINICAL DATA:  Weakness, headache, dizzy.    EXAM:  CT HEAD WITHOUT CONTRAST    TECHNIQUE:  Contiguous axial images were obtained from the base of the skull  through the vertex without intravenous contrast.    COMPARISON:  None.  FINDINGS:  Atherosclerotic and physiologic intracranial calcifications.    There is no evidence of acute intracranial hemorrhage, brain edema,  mass lesion, acute infarction, mass effect, or midline shift. Acute  infarct may be inapparent on noncontrast CT. No other intra-axial  abnormalities are seen, and the ventricles and sulci are within  normal limits in size and symmetry. No abnormal extra-axial  fluid  collections or masses are identified. No significant calvarial  abnormality.     IMPRESSION:  Negative for bleed or other acute intracranial process.    Electronically Signed    By: Arne Cleveland M.D.    On: 08/30/2013 15:31         Verified By: Kandis Cocking, M.D.,   ASSESSMENT AND PLAN:  Assessment/Admission Diagnosis 1. fever of unclear etiology 2. ESRD, recent AVF revision with jump graft 3. HTN, stable 4. GBS   Plan Clinically, the right arm AVF revision with jump graft does not appear infected.  This is non-tender, not erythematous, and not draining.  Unless he has blood cultures that come back positive, I would not do anything to this fistula and would doubt it to be the source.  Can use Abx until cultures cleared.  If blood cultures were to come back positive, the graft portion would likely have to be removed.  No other recs from vascular POV at this time    level 3 consult   Electronic Signatures: Algernon Huxley (MD)  (Signed 14-Apr-16 16:05)  Authored: Chief Complaint and History, PAST MEDICAL/SURGICAL HISTORY, ALLERGIES, HOME MEDICATIONS, Family and Social History, Review of Systems, Physical Exam, LABS, RADIOLOGY, Assessment and Plan   Last Updated: 14-Apr-16 16:05 by Algernon Huxley (MD)

## 2014-08-23 NOTE — Consult Note (Signed)
PSychiatry: Consult received but patient is on vent and interview not possible. Reconsult please if needed once he is awake.  Electronic Signatures: Maryan Sivak, Madie Reno (MD)  (Signed on 21-Apr-16 21:37)  Authored  Last Updated: 21-Apr-16 21:37 by Gonzella Lex (MD)

## 2014-08-23 NOTE — Progress Notes (Signed)
At 0200 was noted that patient had tube feeding coming out of mouth. Pt suctioned and large amount removed from mouth, mouth care performed at this time. Tube feeding stopped immediately and md notified. Dr. Reece Levy , order received to stop tube feeding until am and have dayshift reeval tube feeding status.

## 2014-08-23 NOTE — Consult Note (Addendum)
PATIENT NAME:  Patrick Downs, Patrick Downs MR#:  [id number] DATE OF BIRTH:  [birth year]  DATE OF CONSULTATION:  08/07/2014  REFERRING PHYSICIAN:   Dr. Posey Pronto CONSULTING PHYSICIAN:  Cheral Marker. Ola Spurr, MD  REASON FOR CONSULTATION:  Fever and cough.   HISTORY OF PRESENT ILLNESS:  This is a very pleasant 39 year old gentleman with history of end-stage renal disease who is on dialysis through an AV fistula.  He recently had a procedure done at [facility] on his right forearm AV fistula with a graft.  The patient was admitted April 13 with complaints of worsening cough over several days, body aches and fevers to 102.  He was  tachycardic on admission.  Chest x-ray has been negative.  Blood cultures have been negative. Flu test was negative.  He is progressively febrile, however.   He denies that he has any pain, redness, or drainage at the site of his fistula graft.  His cough is his main complaint and this is nonproductive.  He does not have any shortness of breath.  He has had no sick contacts.  Denies any abdominal pain, nausea, vomiting, or diarrhea.   PAST MEDICAL HISTORY:  1.  End-stage renal disease on hemodialysis.  2.  Guillain-Barre syndrome in 2000.  3.  Hypertension.  4.  Neuropathy.   PAST SURGICAL HISTORY:  Parathyroid operation and multiple AV graft fistula operations.   FAMILY HISTORY:  Positive for significant kidney problems.   MEDICATIONS:  Antibiotics since admission.  Initially, he received a dose of levofloxacin.  He also was treated with azithromycin and ceftriaxone from April 13 to April 14.  Vancomycin was added with the first dose being given April 14 at 2300.  Other medications are reviewed and include Tylenol, Percocet, Tussionex, gabapentin, Zofran, pantoprazole, and Tessalon Perles.   REVIEW OF SYSTEMS: 11 systems were reviewed and are negative except as per HPI.     PHYSICAL EXAMINATION:  VITAL SIGNS:  The patient is febrile when I am examining him.  His most recent temperature was  103.  He is having some chills.  His pulse is 132, blood pressure 91/54, respirations 20, saturation 98% on room air.  He is coughing heavily, but it is dry and nonproductive.  HEENT:  His pupils are reactive.  Sclerae are anicteric.  His oropharynx is clear with no thrush and no exudate.  There is some dryness.  NECK:  Supple.  No anterior cervical, posterior cervical or supraclavicular lymphadenopathy.  HEART:  Tachycardic but regular.  LUNGS:  Clear.  He has good air movement and no wheezing.  ABDOMEN:  Soft, nontender, nondistended.  No hepatosplenomegaly.  EXTREMITIES:  No clubbing, cyanosis or edema.  Vascular access in his right arm.  He has an AV fistula with a graft.  There is no erythema, tenderness or drainage at the incision.   LABORATORY AND IMAGING DATA:  White blood count on April 13 was 5.6.  Hemoglobin 12.3, platelets 121,000.  Repeat platelets are low at 88,000 from April 14.  White count is 5.2.  Differential shows marked lymphocytosis with 0.3 lymphocytes.  Monocytes are slightly elevated at 1.4.  INR is normal.  Blood cultures April 13 negative x 3.  Followup blood cultures April 15 negative x 1.  HIV is nonreactive.  TSH is normal.  Troponin is negative.  LFTs are normal.   Imaging revealed chest x-ray April 13 shows vascular congestion without overt interstitial edema.  No infiltrates or effusions.  Followup chest x-ray April 15 showed lungs clear.  IMPRESSION:  A 39 year old gentleman admitted April 13 with fevers and a dry cough.  He has also had body aches and pleuritic chest pain from coughing.  He had several weeks ago revision of his right forearm graft at [facility].  He has otherwise done well and has no evidence of infection at his graft site.  He has been seen by vascular.  Chest x-ray does not show any focal pneumonia.  He has a profound lymphopenia and is developing thrombocytopenia.  Blood cultures so far have been negative.    His source of infection is unclear.  He has  had a flu test in the ER, but this was an antigen test and was not a PCR.  I think most likely he has a vascular infection given the recent manipulation of his graft, although cultures have been negative.   RECOMMENDATIONS:  1.  I have requested a stat H1N1 and flu A and B PCR to be done and nasopharyngeal swab.  2.  Continue vancomycin and ceftriaxone and azithromycin.  3.  Added doxycycline since I am somewhat concerned given his thrombocytopenia and lymphopenia.  4.  If he remains febrile tomorrow despite 2 days of vancomycin, I would suggest a CT of his chest looking for any other findings that may be of concern given his pronounced cough.    Thank you for the consult.  I will be glad to follow with you.    ____________________________ Cheral Marker. Ola Spurr, MD [phone] D: 08/07/2014 22:01:53 ET T: 08/07/2014 22:16:32 ET JOB#: [id number]  cc: Cheral Marker. Ola Spurr, MD, <Dictator> Turon Kilmer Ola Spurr MD ELECTRONICALLY SIGNED 08/25/2014 10:24

## 2014-08-23 NOTE — Progress Notes (Signed)
Pt continues to have some emesis of tube feed leaking from mouth, md paged, hooked to lws and had 2300 ml returned immediately. Awaiting to hear from MD at this time.

## 2014-08-23 NOTE — Progress Notes (Signed)
Subjective: Interval History: Remains on the ventilator at the moment. Fio2 50% currently. Due for HD again tomorrow  Objective: Vital signs in last 24 hours: Temp:  [99.3 F (37.4 C)] 99.3 F (37.4 C) (05/01 0700) Pulse Rate:  [101-114] 106 (05/01 1100) Resp:  [14-25] 14 (05/01 1100) BP: (98-111)/(65-78) 99/68 mmHg (05/01 1100) SpO2:  [100 %] 100 % (05/01 1100) FiO2 (%):  [40 %-50 %] 40 % (05/01 1134) Weight change:   Intake/Output from previous day: 04/30 0701 - 05/01 0700 In: 25 [NG/GT:25] Out: 2300 [Emesis/NG output:2300] Intake/Output this shift:    General: critically ill, intubated, sedated HEENT: eyes closed, ETT, NGT Neck: trachea midline Cardiovascular: S1S2 no rubs Chest: coarse breath sounds, mechanical ventilation  Abd: soft, NT, BS present Ext: 1+ b/l LE edema Neuro: on the vent, not following commands Access: right arm AVG with fistular component.   Lab Results:  Recent Labs  08/22/14 0653 08/23/14 0725  WBC 8.4 10.5  HGB 7.7* 7.0*  HCT 23.9* 21.7*  PLT 195 218   BMET:  Recent Labs  08/22/14 0653 08/23/14 0725  NA  --  135  K  --  5.2*  CL  --  94*  CO2 27 27  GLUCOSE  --  149*  BUN 71* 81*  CREATININE 8.87* 10.52*  CALCIUM 7.1* 6.7*   No results for input(s): PTH in the last 72 hours. Iron Studies: No results for input(s): IRON, TIBC, TRANSFERRIN, FERRITIN in the last 72 hours.  Studies/Results: US Venous Img Lower Bilateral  08/22/2014   CLINICAL DATA:  Persistent fevers, anticoagulation therapy.  EXAM: BILATERAL LOWER EXTREMITY VENOUS DOPPLER ULTRASOUND  TECHNIQUE: Gray-scale sonography with compression, as well as color and duplex ultrasound, were performed to evaluate the deep venous system from the level of the common femoral vein through the popliteal and proximal calf veins.  COMPARISON:  None  FINDINGS: Normal compressibility of the common femoral, superficial femoral, and popliteal veins, as well as the proximal calf veins. No  filling defects to suggest DVT on grayscale or color Doppler imaging. Doppler waveforms show normal direction of venous flow, normal respiratory phasicity and response to augmentation.  IMPRESSION: 1. No evidence of lower extremity deep vein thrombosis, bilaterally.   Electronically Signed   By: Lucrezia Europe M.D.   On: 08/22/2014 13:38   US Doppler Up Extr  Bilateral ([facility] Hx)  08/22/2014   CLINICAL DATA:  Upper extremity edema  EXAM: BILATERAL UPPER EXTREMITY VENOUS DUPLEX ULTRASOUND  TECHNIQUE: Gray-scale sonography with graded compression, as well as color Doppler and duplex ultrasound were performed to evaluate the bilateral upper extremity deep venous systems from the level of the subclavian vein and including the jugular, axillary, basilic, radial, ulnar and upper cephalic vein. Spectral Doppler was utilized to evaluate flow at rest and with distal augmentation maneuvers.  COMPARISON:  None.  FINDINGS: RIGHT UPPER EXTREMITY  Internal Jugular Vein: No evidence of thrombus. Normal compressibility, respiratory phasicity and response to augmentation.  Subclavian Vein: No evidence of thrombus. Normal compressibility, respiratory phasicity and response to augmentation.  Axillary Vein: No evidence of thrombus. Normal compressibility, respiratory phasicity and response to augmentation.  Cephalic Vein: No evidence of thrombus. Normal compressibility, respiratory phasicity and response to augmentation.  Basilic Vein: No evidence of thrombus. Normal compressibility, respiratory phasicity and response to augmentation.  Brachial Veins: No evidence of thrombus. Normal compressibility, respiratory phasicity and response to augmentation.  Radial Veins: No evidence of thrombus. Normal compressibility, respiratory phasicity and response to augmentation.  Ulnar Veins: No evidence of thrombus. Normal compressibility, respiratory phasicity and response to augmentation.  Venous Reflux:  None.  Other Findings:  None.  LEFT UPPER  EXTREMITY  Internal Jugular Vein: No evidence of thrombus. Normal compressibility, respiratory phasicity and response to augmentation.  Subclavian Vein: No evidence of thrombus. Normal compressibility, respiratory phasicity and response to augmentation.  Axillary Vein: No evidence of thrombus. Normal compressibility, respiratory phasicity and response to augmentation.  Cephalic Vein: No evidence of thrombus. Normal compressibility, respiratory phasicity and response to augmentation.  Basilic Vein: No evidence of thrombus. Normal compressibility, respiratory phasicity and response to augmentation.  Brachial Veins: No evidence of thrombus. Normal compressibility, respiratory phasicity and response to augmentation.  Radial Veins: No evidence of thrombus. Normal compressibility, respiratory phasicity and response to augmentation.  Ulnar Veins: No evidence of thrombus. Normal compressibility, respiratory phasicity and response to augmentation.  Venous Reflux:  None.  Other Findings: There is evidence of a thrombosed dialysis fistula in the left upper arm region. By report, this fistula is chronic and no longer used clinically.  IMPRESSION: No evidence of deep venous thrombosis in either upper extremity. Thrombosed left upper extremity fistula which by report has not been used for many years.   Electronically Signed   By: Lowella Grip III M.D.   On: 08/22/2014 14:03   Ct Lung Screening  08/21/2014   CLINICAL DATA:  Fever and dry cough for 2 weeks. Increasing body aches with pleuritic chest pain. Evaluate for pneumonia or empyema. contrast allergy. Initial encounter.  EXAM: CT CHEST WITHOUT CONTRAST  TECHNIQUE: Multidetector CT imaging of the chest was performed following the standard protocol without IV contrast.  COMPARISON:  None.  FINDINGS: Mediastinum/Nodes: No enlarged mediastinal, hilar or axillary lymph nodes identified. Hilar assessment is limited by the lack of intravenous contrast and adjacent airspace  opacities.The patient is intubated. There is a right IJ central venous catheter with its tip in the upper right atrium. Nasogastric tube extends into the stomach. The heart size is normal. There is no pericardial effusion.Dense coronary artery calcifications again noted. Left subclavian vascular stent noted.  Lungs/Pleura: As demonstrated on the recent radiographs, there is extensive bilateral airspace disease which is new compared with the CT of 2 weeks ago. There confluent components in both lung bases with associated air bronchograms. There is minimal if any dependent pleural fluid bilaterally and no evidence of empyema. Underlying parenchymal calcifications are present in both lungs.  Upper abdomen:  Unremarkable.  There is no adrenal mass.  Musculoskeletal/Chest wall: No chest wall mass or acute osseous findings demonstrated. There is mild generalized soft tissue edema. The bones appear somewhat sclerotic, likely related to renal osteodystrophy.  IMPRESSION: 1. Extensive bilateral airspace opacities consistent with pneumonia or ARDS. 2. Minimal if any pleural fluid.  No evidence of empyema. 3. Underlying parenchymal calcifications throughout both lungs, likely due to previous granulomatous disease. 4. Renal osteodystrophy.   Electronically Signed   By: Richardean Sale M.D.   On: 08/21/2014 17:36    I have reviewed the patient's current medications.  Assessment/Plan: 39 year old male with End Stage Renal Disease on MWF schedule with right arm AVF/AVG followed by [facility], [place], hypertension, hyperlipidemia, Guillain-Barre syndrome, admission for fever 08/05/14  1. ESRD: MWF HD: [facility]. - Pt due for HD tomorrow, no acute indication for HD today.  2. Fever with confusion and agitation, Leukopenia. with pneumonia.   - vent assisted -  Bronch done on Apr  21 -  CT chest reveals b/l pneumonia vs ARDS. -  Continue abx therapy per ID.  3. Acute Respiratory  Failure/ARDS: intubated  - fio2 up to 50% today, continue vent support.    4. Anemia of Chronic Kidney Disease: continue epogen with HD tomorrow, hgb 7.0 today.   5. Secondary Hyperparathyroidism:  Phos up to 7.1, should come down with HD tomorrow.   LOS: 18 days   Ardath Lepak 08/23/2014,11:51 AM

## 2014-08-23 NOTE — Progress Notes (Signed)
Vancomycin Dosing: (Day 16,  ESRD,   ConstipationPharmacy consulted to dose vancomycin for this 39yo M being on HD MWF schedule presenting with fever. Patient with possible endovascular infections vs atypical infection. Empiric tamiflu ordered (stop date 4/23).  Patient initiated on clindamycin 600mg  IV Q8hr. Patient's acyclovir discontinued on 4/22. Patient initiated on micafungin 150mg  IV daily on 4/22. Allergies: IVP Dye, Minoxidil, Morphine, OmnipaqueHD MWF schedule, Wt: 116kg, IBW: 82kg, Dosing Wt: 95.9kg. Goal vanc trough: 15-25Will continue patient on vancomycin 1250mg  IV to be infused during last hour of dialysis. Will obtain follow-up level with dialysis on 4/25.      Last BM 4/25: Will continue patient on docusate 100mg  VT BID and senna 11mL VT Q12hr and add bisacodyl suppository x 1. 4/25:  Vanc trough @ 15:00 = 10 mcg/mLWill increase maintenance dose to Vancomycin 1500 mg IV to be given 90 minutes before the end of each HD session.Will draw next Vancomycin trough 30 minutes prior to HD session on 5/2. No further adjustments warranted at this time.   Meropenem dosing day 2:  Pharmacy consulted to dose meropenem in this 39 year old male admitted with sepsis, ESRD.Allergies: IVP dye, minoxidil, morphine, omnipaque 140 WBC=8.9, Temp=103.1Sputum Cx : [id number] inches, TBW=262.9lbs(119.2kg), IBW=82.2kg, SrCr=8.67 mg/dLMeropenem 1000 mg IV Q24H originally ordered.  Will adjust dose toMeropenem 500 mg IV Q24H. Pharmacy will follow and adjust per consult.

## 2014-08-24 ENCOUNTER — Encounter: Payer: Self-pay | Admitting: Internal Medicine

## 2014-08-24 LAB — HEMOGLOBIN: Hemoglobin: 6.1 g/dL — ABNORMAL LOW (ref 13.0–18.0)

## 2014-08-24 LAB — RENAL FUNCTION PANEL
ANION GAP: 18 — AB (ref 5–15)
Albumin: 2 g/dL — ABNORMAL LOW (ref 3.5–5.0)
BUN: 97 mg/dL — AB (ref 6–20)
CO2: 23 mmol/L (ref 22–32)
CREATININE: 12.78 mg/dL — AB (ref 0.61–1.24)
Calcium: 6.6 mg/dL — ABNORMAL LOW (ref 8.9–10.3)
Chloride: 95 mmol/L — ABNORMAL LOW (ref 101–111)
GFR calc non Af Amer: 4 mL/min — ABNORMAL LOW (ref >60)
GFR, EST AFRICAN AMERICAN: 5 mL/min — AB (ref >60)
Glucose, Bld: 94 mg/dL (ref 65–99)
PHOSPHORUS: 9.3 mg/dL — AB (ref 2.5–4.6)
Potassium: 5.9 mmol/L — ABNORMAL HIGH (ref 3.5–5.1)
Sodium: 136 mmol/L (ref 135–145)

## 2014-08-24 LAB — GLUCOSE, CAPILLARY: GLUCOSE-CAPILLARY: 94 mg/dL (ref 70–99)

## 2014-08-24 LAB — PREPARE RBC (CROSSMATCH)

## 2014-08-24 LAB — CBC
HEMATOCRIT: 20.4 % — AB (ref 40.0–52.0)
Hemoglobin: 6.6 g/dL — ABNORMAL LOW (ref 13.0–18.0)
MCH: 29.3 pg (ref 26.0–34.0)
MCHC: 32.2 g/dL (ref 32.0–36.0)
MCV: 90.9 fL (ref 80.0–100.0)
Platelets: 187 10*3/uL (ref 150–440)
RBC: 2.24 MIL/uL — ABNORMAL LOW (ref 4.40–5.90)
RDW: 16.5 % — AB (ref 11.5–14.5)
WBC: 7.3 10*3/uL (ref 3.8–10.6)

## 2014-08-24 LAB — HEMOGLOBIN AND HEMATOCRIT, BLOOD
HEMATOCRIT: 24.6 % — AB (ref 40.0–52.0)
Hemoglobin: 8 g/dL — ABNORMAL LOW (ref 13.0–18.0)

## 2014-08-24 LAB — ABO/RH: ABO/RH(D): O POS

## 2014-08-24 LAB — EXPECTORATED SPUTUM ASSESSMENT W REFEX TO RESP CULTURE

## 2014-08-24 MED ORDER — NOREPINEPHRINE BITARTRATE 1 MG/ML IV SOLN: 0.0000 ug/min | INTRAVENOUS | Status: DC

## 2014-08-24 MED ORDER — BUDESONIDE 0.5 MG/2ML IN SUSP
0.5000 mg | Freq: Two times a day (BID) | RESPIRATORY_TRACT | Status: DC
  Administered 2014-08-24 – 2014-08-26 (×5): 0.5 mg via RESPIRATORY_TRACT
  Filled 2014-08-24 (×4): qty 2

## 2014-08-24 MED ORDER — CETYLPYRIDINIUM CHLORIDE 0.05 % MT LIQD
7.0000 mL | Freq: Four times a day (QID) | OROMUCOSAL | Status: DC
  Administered 2014-08-24: 7 mL via OROMUCOSAL

## 2014-08-24 MED ORDER — NOREPINEPHRINE BITARTRATE 1 MG/ML IV SOLN
0.0000 ug/min | INTRAVENOUS | Status: DC
  Administered 2014-08-24: 8 ug/min via INTRAVENOUS
  Administered 2014-08-25: 10 ug/min via INTRAVENOUS
  Filled 2014-08-24 (×2): qty 16

## 2014-08-24 MED ORDER — CHLORHEXIDINE GLUCONATE 0.12 % MT SOLN
15.0000 mL | Freq: Two times a day (BID) | OROMUCOSAL | Status: DC
  Administered 2014-08-24: 15 mL via OROMUCOSAL

## 2014-08-24 MED ORDER — SODIUM CHLORIDE 0.9 % IV SOLN: Freq: Once | INTRAVENOUS | Status: DC

## 2014-08-24 NOTE — Progress Notes (Signed)
HD END

## 2014-08-24 NOTE — Progress Notes (Signed)
HD START 

## 2014-08-24 NOTE — Progress Notes (Signed)
RN called MD about clarification of extubation, since patient still lethargic and patient has OG tube hooked up to low intermittent suction. MD wants to extubate without placing an NG tube at this time. RN and MD will monitor for need for suction after extubation per MD.

## 2014-08-24 NOTE — Progress Notes (Addendum)
PULMONARY / CRITICAL CARE MEDICINE   Name: Patrick Downs MRN: [id number] DOB: 1976-01-22    ADMISSION DATE:  08/05/2014   SUBJECTIVE:  patient with  hypoxic resp failure, patient with progressive b/l infiltrates on CXR, failed attempted SAT/SBt yesterday and patient with increased WOB and agitation with diaphoresis,  remains intubated,sedated, plan to wean fio2 and PEEp as tolerated,  patient remains critically ill,  patient with febrile episodes, CT Chest with ARDS appearance.  Gram negative species on sputum culture Had a large amount gastric residual, TF held. clinical signs of ileus  VITAL SIGNS: Temp:  [98 F (36.7 C)-98.9 F (37.2 C)] 98 F (36.7 C) (05/02 0756) Pulse Rate:  [83-106] 87 (05/02 0800) Resp:  [14-25] 25 (05/02 0800) BP: (84-111)/(48-78) 96/72 mmHg (05/02 0800) SpO2:  [94 %-100 %] 97 % (05/02 0814) FiO2 (%):  [35 %-50 %] 35 % (05/02 0814) Weight:  [292 lb 8.8 oz (132.7 kg)] 292 lb 8.8 oz (132.7 kg) (05/02 0435) HEMODYNAMICS:   VENTILATOR SETTINGS: Vent Mode:  [-] PRVC FiO2 (%):  [35 %-50 %] 35 % Set Rate:  [25 bmp] 25 bmp Vt Set:  [550 mL] 550 mL PEEP:  [5 cmH20-8 cmH20] 5 cmH20 INTAKE / OUTPUT:  Intake/Output Summary (Last 24 hours) at 08/24/14 0903 Last data filed at 08/24/14 0600  Gross per 24 hour  Intake 2451.05 ml  Output   1800 ml  Net 651.05 ml    PHYSICAL EXAMINATION:  GEN disheveled, critically ill appearing   HEENT pale conjunctivae, PERRL, orally intubated   NECK supple  No masses   CARD regular rate  no murmur  no thrills  No LE edema  tachy   VASCULAR ACCESS AV graft present   ABD hypoactive BS   LYMPH negative neck, negative axillae   EXTR negative edema   SKIN No rashes, skin turgor good   NEURO negative Babinski R/L, follows commands   PSYCH sedated   RESP no use of accessory muscles  rhonchi  on vent    LABS:  CBC  Recent Labs Lab 08/20/14 0417 08/22/14 0653 08/23/14 0725 08/24/14 0430  WBC 8.9 8.4  10.5  --   HGB 8.1* 7.7* 7.0* 6.1*  HCT 24.0* 23.9* 21.7*  --   PLT 245 195 218  --    Coag's No results for input(s): APTT, INR in the last 168 hours. BMET  Recent Labs Lab 08/20/14 0417 08/22/14 0653 08/23/14 0725  NA 136 136 135  K 5.0 5.1 5.2*  CL 94* 95* 94*  CO2 28 27 27   BUN 64* 71* 81*  CREATININE 8.67* 8.87* 10.52*  GLUCOSE 161* 176* 149*   Electrolytes  Recent Labs Lab 08/20/14 0417 08/22/14 0653 08/23/14 0725  CALCIUM 6.9* 7.1* 6.7*  MG  --   --  2.6*  PHOS  --   --  7.1*   Sepsis Markers No results for input(s): LATICACIDVEN, PROCALCITON, O2SATVEN in the last 168 hours. ABG No results for input(s): PHART, PCO2ART, PO2ART in the last 168 hours. Liver Enzymes  Recent Labs Lab 08/17/14 1502 08/19/14 0616 08/23/14 0725  AST  --  61* 68*  ALT  --  63 58  ALKPHOS  --  57 79  BILITOT  --   --  0.7  ALBUMIN 2.4* 2.1* 2.0*   Cardiac Enzymes No results for input(s): TROPONINI, PROBNP in the last 168 hours. Glucose  Recent Labs Lab 08/24/14 0729  GLUCAP 94    Imaging Dg Abd 1 View  08/23/2014   CLINICAL DATA:  Abdominal distention  EXAM: ABDOMEN - 1 VIEW  COMPARISON:  August 18, 2014  FINDINGS: There is air in the stomach. There is a paucity of gas in the small and large bowel. No free air is seen. There are vascular calcifications in the pelvis.  IMPRESSION: Paucity of small bowel and large bowel gas. This finding may be seen normally. However, it may also be seen as a consequence of enteritis or early ileus. Obstruction not felt to be likely. No apparent free air.   Electronically Signed   By: Lowella Grip III M.D.   On: 08/23/2014 13:07     ASSESSMENT / PLAN: 39 year old AAM  patient with ESRD on hemodialysis with the cough, fever, and lactic acidosis with acute metabolic encephlopathy with fevers with acute septic shock with progressive pneumonia and ARDS candidal albicans pneumonia, now with gram negative pneumonia   1.septic shock due to  right ML pneumonia with progressive b/l infiltrates. with hypotension /tachycardia  -use vasopressors to keep MAP>60 -follow up ID recs-on antifungal therapy and meropenen -get AB\G and LA as needed  2.Pneumonia with Resp failure-failure to wean from vent due to resp muscle fatigue and delerium -on vent support -s/p bronch -chest CT with progressive infiltrates bilateraly, ARDS appearing.  -consider repeat bronch -gram negative species in sputum cultures  3.fevers-with encephalopathy - pneumonia  -follow up Id recs-antifungal therapy and meropenem -U\S of UE with left UE thrombosed dialysis fistula - possibly chronic  4.ESRD on HD -follow up nephrology recs  5.respiratory failure-ARDS-will attempt another SAT/SBT today -continue full vent support -follow up abg and cxr as needed -wean fio2 and peep as tolerated -Low lung volume strategy  6.Delerium continue current meds -xanax -seroquel -oxycodone  7. Large amount of gastric residuals - TF held, will follow up KUB   I have personally obtained a history, examined the patient, evaluated Pertinent laboratory and RadioGraph[employer]/imaging results, and  formulated the assessment and plan   The Patient requires high complexity decision making for assessment and support, frequent evaluation and titration of therapies, application of advanced monitoring technologies and extensive interpretation of multiple databases. Critical Care Time devoted to patient care services described in this note is 35 minutes.   Overall, patient is critically ill, prognosis is guarded.  Patient with Multiorgan failure and at high risk for cardiac arrest and death.    Corrin Parker, M.D. Pulmonary & [place] Director Intensive Care Unit

## 2014-08-24 NOTE — Progress Notes (Signed)
Nutrition Follow-up  DOCUMENTATION CODES:     INTERVENTION:  Coordination of care: Discussed in ICU rounds, Dr Mortimer Fries wanting to hold tube feeding at this time secondary to possible ileus and NG tube to low intermittent suction NUTRITION DIAGNOSIS:  Inadequate oral intake related to inability to eat as evidenced by NPO status, ongoing    GOAL:   Energy intake: goal would be for to GI issues to resolve and to restart enteral nutrition within 48-72 hr  MONITOR:  Enteral nutrition:  Electrolyte and renal profile Gastrointestinal profile  REASON FOR ASSESSMENT:       ASSESSMENT:  Pt remains on vent.  Possible ileus, NG tube hooked to suction  Electrolyte and renal profile: Phosphorus 7.1, BUN 81, creatinine 1.52, Ca 6.7, Mg 2.9, K 5.2 Glucose profile: glucose 149   Height:  Ht Readings from Last 1 Encounters:  08/21/14 6\' 2"  (1.88 m)    Weight:  Wt Readings from Last 1 Encounters:  08/24/14 292 lb 8.8 oz (132.7 kg)      Wt Readings from Last 10 Encounters:  08/24/14 292 lb 8.8 oz (132.7 kg)    BMI:  Body mass index is 37.55 kg/(m^2).  Estimated Nutritional Needs:  Kcal:  [id number] kcals/d 11-14 kcals/kg actual body wt 133 kg  Protein:  172-215 gm/d (2.0-2.5 g/kg) Using IBW of 86kg  Fluid:  (1064ml + urine output)  Skin:  Reviewed, no issues  Diet Order:  Diet NPO time specified  EDUCATION NEEDS:  No education needs identified at this time   Intake/Output Summary (Last 24 hours) at 08/24/14 1514 Last data filed at 08/24/14 0921  Gross per 24 hour  Intake 1396.8 ml  Output    800 ml  Net  596.8 ml    Last BM:  5/1 loose BM, NG 1814ml output last 24 hr, abdomen distended  High level care  Vernesha Talbot B. Zenia Resides, [place], [place] (pager)

## 2014-08-24 NOTE — Progress Notes (Signed)
Pre hd 

## 2014-08-24 NOTE — Progress Notes (Signed)
Patient ID: Patrick Downs, male   DOB: 1976-01-11, 39 y.o.   MRN: [id number]  [facility] PROGRESS NOTE  Patrick Downs [id number] DOB: 1975/11/14 DOA: 08/05/2014 PCP: No primary care provider on file.  HPI/Subjective: Patient was just extubated. Still lethargic. Unable to give me any history.  Objective: Filed Vitals:   08/24/14 1300  BP: 97/65  Pulse: 109  Temp:   Resp: 23    Intake/Output Summary (Last 24 hours) at 08/24/14 1331 Last data filed at 08/24/14 0600  Gross per 24 hour  Intake 1388.6 ml  Output   1800 ml  Net -411.4 ml   Filed Weights   08/21/14 1357 08/24/14 0435  Weight: 119.205 kg (262 lb 12.8 oz) 132.7 kg (292 lb 8.8 oz)    ROS: Review of Systems  Unable to perform ROS  secondary to lethargy Exam: Physical Exam  Constitutional: He appears lethargic.  HENT:  Nose: No mucosal edema.  Mouth/Throat: No oropharyngeal exudate or posterior oropharyngeal edema.  Eyes: Conjunctivae, EOM and lids are normal. Pupils are equal, round, and reactive to light.  Neck: No JVD present. Carotid bruit is not present. No edema present. No thyroid mass and no thyromegaly present.  Cardiovascular: S1 normal and S2 normal.  Exam reveals no gallop.   No murmur heard. Pulses:      Dorsalis pedis pulses are 1+ on the right side, and 1+ on the left side.  Respiratory: No respiratory distress. He has no wheezes. He has rhonchi in the right upper field, the right lower field, the left upper field, the left middle field and the left lower field. He has no rales.  GI: Soft. Bowel sounds are normal. There is no tenderness.  Lymphadenopathy:    He has no cervical adenopathy.  Neurological: He appears lethargic.  Skin: Skin is warm. No rash noted. Nails show no clubbing.   psychiatric: unable to test, lethargy Data Reviewed: Basic Metabolic Panel:  Recent Labs Lab 08/17/14 1502 08/19/14 0616 08/20/14 0417 08/22/14 0653 08/23/14 0725  NA 138 SEE COMMENT  136 136 135  K 4.0 4.5 5.0 5.1 5.2*  CL 95* 94* 94* 95* 94*  CO2 29 27 28 27 27   GLUCOSE 96 124* 161* 176* 149*  BUN 61* 82* 64* 71* 81*  CREATININE 9.60* 11.58* 8.67* 8.87* 10.52*  CALCIUM 7.5* 6.9* 6.9* 7.1* 6.7*  MG  --   --   --   --  2.6*  PHOS  --   --   --   --  7.1*   Liver Function Tests:  Recent Labs Lab 08/17/14 1502 08/19/14 0616 08/23/14 0725  AST  --  61* 68*  ALT  --  63 58  ALKPHOS  --  57 79  BILITOT  --   --  0.7  PROT  --  SEE COMMENT 6.4*  ALBUMIN 2.4* 2.1* 2.0*   CBC:  Recent Labs Lab 08/17/14 1502 08/19/14 0616 08/20/14 0417 08/22/14 0653 08/23/14 0725 08/24/14 0430  WBC 9.6 9.3 8.9 8.4 10.5  --   NEUTROABS 7.6* 7.8* 7.4* 5.7 7.5*  --   HGB 8.6* 7.7* 8.1* 7.7* 7.0* 6.1*  HCT 25.4* 22.8* 24.0* 23.9* 21.7*  --   MCV 91 92 91 92 91.0  --   PLT 243 208 245 195 218  --         Studies:   Scheduled Meds: . budesonide (PULMICORT) nebulizer solution  0.5 mg Nebulization Q12H  . calcium acetate  2,001 mg  Oral TID WC  . calcium-vitamin D  1 tablet Oral BID WC  . docusate  100 mg Oral Q12H  . famotidine  20 mg Oral QHS  . fluconazole (DIFLUCAN) IV  400 mg Intravenous Q48H  . heparin subcutaneous  5,000 Units Subcutaneous Q12H  . ipratropium  2 spray Each Nare BID  . ipratropium-albuterol  3 mL Nebulization Q4H  . meropenem (MERREM) IV  500 mg Intravenous Q24H  . multivitamin  1 tablet Oral QHS  . scopolamine  1 patch Transdermal Q72H  . sennosides  10 mL Oral Q12H  . sodium chloride  5 mL Intravenous Daily  . vancomycin  1,500 mg Intravenous Q M,W,F-HD   Continuous Infusions:    Assessment/Plan: #1 septic shock with bilateral pneumonia and ARDS with fever -Antibiotics as per infectious disease #2 acute respiratory failure due to bilateral pneumonia and ARDS -Patient extubated just before. Patient is still lethargic. Close clinical follow-up needed here in the ICU. High risk for reintubation if mental status does not improve. Continue  oxygen supplementation. #3 acute encephalopathy due to sepsis and sedative medications. #4 pancytopenia -Hemoglobin drifting down to 6.1 and transfusion ordered with dialysis today. -And neutropenia and thrombocytopenia improved #5 neuropathy #6 end-stage renal disease -Continue hemodialysis today #7 vomiting -Resolved.   Code Status:     Code Status Orders        Start     Ordered   08/22/14 1435  Full code   Continuous     08/22/14 1457     Family Communication: Spoke with mother at the bedside. Disposition Plan: To be determined.  Time spent: 26min  Patrick Downs  [facility]

## 2014-08-24 NOTE — Care Management (Signed)
Patient has just been extubated.  Discussed with attending that if patient requires reintubation, need to pursue LTAC

## 2014-08-24 NOTE — Progress Notes (Signed)
PRE HD   

## 2014-08-24 NOTE — Progress Notes (Addendum)
Patient is placed on high fowlers position, cuff deflated, suctioned orally and endotracheally and then extubated to 50% venti mask. Tolertaing fairly well

## 2014-08-24 NOTE — Progress Notes (Signed)
POST HD 

## 2014-08-24 NOTE — Care Management (Signed)
Tube Feedings on hold due to large residuals.  Weaning attempts have failed.  Katrina Stack, BSN, RN, [id number]

## 2014-08-24 NOTE — Progress Notes (Signed)
[facility]  ROUNDING NOTE   Subjective:   Intubated, sedated. Mother at bedside.  On norepinephrine and vasopressin gtt Afebrile.  Out: NGT 1800 hgb 6.1 - 1 unit PRBC ordered.   Objective:  Vital signs in last 24 hours:  Temp:  [98 F (36.7 C)-98.9 F (37.2 C)] 98 F (36.7 C) (05/02 0756) Pulse Rate:  [83-106] 87 (05/02 0800) Resp:  [14-25] 25 (05/02 0800) BP: (84-111)/(48-78) 96/72 mmHg (05/02 0800) SpO2:  [94 %-100 %] 97 % (05/02 0814) FiO2 (%):  [35 %-40 %] 35 % (05/02 0814) Weight:  [132.7 kg (292 lb 8.8 oz)] 132.7 kg (292 lb 8.8 oz) (05/02 0435)  Weight change:  Filed Weights   08/21/14 1357 08/24/14 0435  Weight: 119.205 kg (262 lb 12.8 oz) 132.7 kg (292 lb 8.8 oz)    Intake/Output: I/O last 3 completed shifts: In: 2476.1 [I.V.:2061.1; NG/GT:165; IV Piggyback:250] Out: 4100 [Emesis/NG output:4100]   Intake/Output this shift:     General: intubated, sedated, critically ill Head: +NGT, +ETT, eyes closed.  Neck: trachea midline CVS: regular rate Resp: clear, vent: PRBC FiO2 35% ABD: mild distension EXT: +peripheral edema Neuro: sedated Access: Access: right arm AVG with fistular component.    Basic Metabolic Panel:  Recent Labs Lab 08/17/14 1502 08/19/14 0616 08/20/14 0417 08/22/14 0653 08/23/14 0725  NA 138 SEE COMMENT 136 136 135  K 4.0 4.5 5.0 5.1 5.2*  CL 95* 94* 94* 95* 94*  CO2 29 27 28 27 27   GLUCOSE 96 124* 161* 176* 149*  BUN 61* 82* 64* 71* 81*  CREATININE 9.60* 11.58* 8.67* 8.87* 10.52*  CALCIUM 7.5* 6.9* 6.9* 7.1* 6.7*  MG  --   --   --   --  2.6*  PHOS  --   --   --   --  7.1*    Liver Function Tests:  Recent Labs Lab 08/17/14 1502 08/19/14 0616 08/23/14 0725  AST  --  61* 68*  ALT  --  63 58  ALKPHOS  --  57 79  BILITOT  --   --  0.7  PROT  --  SEE COMMENT 6.4*  ALBUMIN 2.4* 2.1* 2.0*   No results for input(s): LIPASE, AMYLASE in the last 168 hours. No results for input(s): AMMONIA in the last 168  hours.  CBC:  Recent Labs Lab 08/17/14 1502 08/19/14 0616 08/20/14 0417 08/22/14 0653 08/23/14 0725 08/24/14 0430  WBC 9.6 9.3 8.9 8.4 10.5  --   NEUTROABS 7.6* 7.8* 7.4* 5.7 7.5*  --   HGB 8.6* 7.7* 8.1* 7.7* 7.0* 6.1*  HCT 25.4* 22.8* 24.0* 23.9* 21.7*  --   MCV 91 92 91 92 91.0  --   PLT 243 208 245 195 218  --     Cardiac Enzymes: No results for input(s): CKTOTAL, CKMB, CKMBINDEX, TROPONINI in the last 168 hours.  BNP: Invalid input(s): POCBNP  CBG:  Recent Labs Lab 08/24/14 0729  GLUCAP 94    Microbiology: Results for orders placed or performed during the hospital encounter of 08/05/14  Influenza A&B Antigens [facility])     Status: None   Collection Time: 08/05/14  5:49 PM  Result Value Ref Range Status   Micro Text Report   Final       COMMENT                   NEGATIVE FOR INFLUENZA A (ANTIGEN ABSENT)   COMMENT  NEGATIVE FOR INFLUENZA B (ANTIGEN ABSENT)   ANTIBIOTIC                                                      Culture, blood (single)     Status: None   Collection Time: 08/05/14  6:26 PM  Result Value Ref Range Status   Micro Text Report   Final       COMMENT                   NO GROWTH AEROBICALLY/ANAEROBICALLY IN 5 DAYS   ANTIBIOTIC                                                      Culture, blood (single)     Status: None   Collection Time: 08/05/14  6:41 PM  Result Value Ref Range Status   Micro Text Report   Final       COMMENT                   NO GROWTH AEROBICALLY/ANAEROBICALLY IN 5 DAYS   ANTIBIOTIC                                                      Culture, blood (single)     Status: None   Collection Time: 08/05/14 10:15 PM  Result Value Ref Range Status   Micro Text Report   Final       COMMENT                   NO GROWTH AEROBICALLY/ANAEROBICALLY IN 5 DAYS   ANTIBIOTIC                                                      Culture, blood (single)     Status: None   Collection Time: 08/07/14 10:30  AM  Result Value Ref Range Status   Micro Text Report   Final       COMMENT                   NO GROWTH AEROBICALLY/ANAEROBICALLY IN 5 DAYS   ANTIBIOTIC                                                      Culture, blood (single)     Status: None   Collection Time: 08/07/14 10:43 AM  Result Value Ref Range Status   Micro Text Report   Final       COMMENT                   NO GROWTH AEROBICALLY/ANAEROBICALLY IN 5 DAYS   ANTIBIOTIC  Culture, blood (single)     Status: None   Collection Time: 08/10/14  6:43 PM  Result Value Ref Range Status   Micro Text Report   Final       COMMENT                   NO GROWTH AEROBICALLY/ANAEROBICALLY IN 5 DAYS   ANTIBIOTIC                                                      Culture, blood (single)     Status: None   Collection Time: 08/10/14  6:43 PM  Result Value Ref Range Status   Micro Text Report   Final       COMMENT                   NO GROWTH AEROBICALLY/ANAEROBICALLY IN 5 DAYS   ANTIBIOTIC                                                      Culture, blood (single)     Status: None   Collection Time: 08/13/14 12:40 AM  Result Value Ref Range Status   Micro Text Report   Final       COMMENT                   NO GROWTH AEROBICALLY/ANAEROBICALLY IN 5 DAYS   ANTIBIOTIC                                                      Culture, blood (single)     Status: None   Collection Time: 08/13/14 12:50 AM  Result Value Ref Range Status   Micro Text Report   Final       COMMENT                   NO GROWTH AEROBICALLY/ANAEROBICALLY IN 5 DAYS   ANTIBIOTIC                                                      Culture, fungus without smear     Status: None (Preliminary result)   Collection Time: 08/13/14  8:40 AM  Result Value Ref Range Status   Micro Text Report   Preliminary       SOURCE: BRONCH WASHING    ORGANISM 1                Candida albicans   COMMENT                    -   COMMENT                   -   ANTIBIOTIC  ORG#1                                               Bronchial Wash Culture     Status: None   Collection Time: 08/13/14  8:40 AM  Result Value Ref Range Status   Micro Text Report   Final       ORGANISM 1                LIGHT GROWTH Candida albicans   COMMENT                   -   ANTIBIOTIC                    ORG#1                                               Influenza A,B,H1N1 - PCR [facility])     Status: None   Collection Time: 08/15/14  2:20 PM  Result Value Ref Range Status   Influenza A By PCR NEGATIVE NEGATIVE Final   Influenza B By PCR NEGATIVE NEGATIVE Final   H1N1 flu by pcr NOT DETECTED NOT-DETECTED Final    Comment:                  ----------------------- The Xpert Flu assay (FDA approval for nasal aspirates or washes and nasopharyngeal swab specimens), is intended as an aid in the diagnosis of influenza and should not be used as a sole basis for treatment.   Culture, expectorated sputum-assessment     Status: None (Preliminary result)   Collection Time: 08/21/14 12:36 PM  Result Value Ref Range Status   Micro Text Report   Preliminary       SOURCE: ETS    COMMENT                   HOLDING FOR POSSIBLE PATHOGEN   GRAM STAIN                FEW WHITE BLOOD CELLS RARE GRAM NEGATIVE DIPLOCOCCI   GRAM STAIN                RARE GRAM POSITIVE COCCI   GRAM STAIN                GOOD SPECIMEN-80-90% WBC   GRAM STAIN                RARE GRAM NEGATIVE COCCO-BACILLI   ANTIBIOTIC                                                      Culture, blood (single)     Status: None (Preliminary result)   Collection Time: 08/21/14  3:31 PM  Result Value Ref Range Status   Micro Text Report   Preliminary       COMMENT                   NO GROWTH IN 36 HOURS   ANTIBIOTIC  Culture, blood (single)     Status: None (Preliminary result)   Collection Time: 08/21/14   5:31 PM  Result Value Ref Range Status   Micro Text Report   Preliminary       COMMENT                   NO GROWTH IN 36 HOURS   ANTIBIOTIC                                                        Coagulation Studies: No results for input(s): LABPROT, INR in the last 72 hours.  Urinalysis: No results for input(s): COLORURINE, LABSPEC, PHURINE, GLUCOSEU, HGBUR, BILIRUBINUR, KETONESUR, PROTEINUR, UROBILINOGEN, NITRITE, LEUKOCYTESUR in the last 72 hours.  Invalid input(s): APPERANCEUR    Imaging: Dg Abd 1 View  08/23/2014   CLINICAL DATA:  Abdominal distention  EXAM: ABDOMEN - 1 VIEW  COMPARISON:  August 18, 2014  FINDINGS: There is air in the stomach. There is a paucity of gas in the small and large bowel. No free air is seen. There are vascular calcifications in the pelvis.  IMPRESSION: Paucity of small bowel and large bowel gas. This finding may be seen normally. However, it may also be seen as a consequence of enteritis or early ileus. Obstruction not felt to be likely. No apparent free air.   Electronically Signed   By: Lowella Grip III M.D.   On: 08/23/2014 13:07   US Venous Img Lower Bilateral  08/22/2014   CLINICAL DATA:  Persistent fevers, anticoagulation therapy.  EXAM: BILATERAL LOWER EXTREMITY VENOUS DOPPLER ULTRASOUND  TECHNIQUE: Gray-scale sonography with compression, as well as color and duplex ultrasound, were performed to evaluate the deep venous system from the level of the common femoral vein through the popliteal and proximal calf veins.  COMPARISON:  None  FINDINGS: Normal compressibility of the common femoral, superficial femoral, and popliteal veins, as well as the proximal calf veins. No filling defects to suggest DVT on grayscale or color Doppler imaging. Doppler waveforms show normal direction of venous flow, normal respiratory phasicity and response to augmentation.  IMPRESSION: 1. No evidence of lower extremity deep vein thrombosis, bilaterally.   Electronically  Signed   By: Lucrezia Europe M.D.   On: 08/22/2014 13:38   US Doppler Up Extr  Bilateral ([facility] Hx)  08/22/2014   CLINICAL DATA:  Upper extremity edema  EXAM: BILATERAL UPPER EXTREMITY VENOUS DUPLEX ULTRASOUND  TECHNIQUE: Gray-scale sonography with graded compression, as well as color Doppler and duplex ultrasound were performed to evaluate the bilateral upper extremity deep venous systems from the level of the subclavian vein and including the jugular, axillary, basilic, radial, ulnar and upper cephalic vein. Spectral Doppler was utilized to evaluate flow at rest and with distal augmentation maneuvers.  COMPARISON:  None.  FINDINGS: RIGHT UPPER EXTREMITY  Internal Jugular Vein: No evidence of thrombus. Normal compressibility, respiratory phasicity and response to augmentation.  Subclavian Vein: No evidence of thrombus. Normal compressibility, respiratory phasicity and response to augmentation.  Axillary Vein: No evidence of thrombus. Normal compressibility, respiratory phasicity and response to augmentation.  Cephalic Vein: No evidence of thrombus. Normal compressibility, respiratory phasicity and response to augmentation.  Basilic Vein: No evidence of thrombus. Normal compressibility, respiratory phasicity and response to augmentation.  Brachial Veins: No evidence of thrombus.  Normal compressibility, respiratory phasicity and response to augmentation.  Radial Veins: No evidence of thrombus. Normal compressibility, respiratory phasicity and response to augmentation.  Ulnar Veins: No evidence of thrombus. Normal compressibility, respiratory phasicity and response to augmentation.  Venous Reflux:  None.  Other Findings:  None.  LEFT UPPER EXTREMITY  Internal Jugular Vein: No evidence of thrombus. Normal compressibility, respiratory phasicity and response to augmentation.  Subclavian Vein: No evidence of thrombus. Normal compressibility, respiratory phasicity and response to augmentation.  Axillary Vein: No evidence of  thrombus. Normal compressibility, respiratory phasicity and response to augmentation.  Cephalic Vein: No evidence of thrombus. Normal compressibility, respiratory phasicity and response to augmentation.  Basilic Vein: No evidence of thrombus. Normal compressibility, respiratory phasicity and response to augmentation.  Brachial Veins: No evidence of thrombus. Normal compressibility, respiratory phasicity and response to augmentation.  Radial Veins: No evidence of thrombus. Normal compressibility, respiratory phasicity and response to augmentation.  Ulnar Veins: No evidence of thrombus. Normal compressibility, respiratory phasicity and response to augmentation.  Venous Reflux:  None.  Other Findings: There is evidence of a thrombosed dialysis fistula in the left upper arm region. By report, this fistula is chronic and no longer used clinically.  IMPRESSION: No evidence of deep venous thrombosis in either upper extremity. Thrombosed left upper extremity fistula which by report has not been used for many years.   Electronically Signed   By: Lowella Grip III M.D.   On: 08/22/2014 14:03     Medications:   . dexmedetomidine Stopped (08/23/14 0000)  . feeding supplement (NEPRO CARB STEADY) Stopped (08/23/14 0000)  . fentaNYL 400 mcg/hr (08/24/14 0830)  . midazolam (VERSED) 50mg  in NS 1mL (1mg /ml) premix infusion 6 mg/hr (08/24/14 0600)  . norepinephrine (LEVOPHED) Adult infusion 6 mcg/min (08/24/14 0400)  . vasopressin (PITRESSIN) infusion - *FOR SHOCK* 0.03 Units/min (08/23/14 0942)   . ALPRAZolam  1 mg Oral 4 times per day  . antiseptic oral rinse  7 mL Mouth Rinse QID  . budesonide (PULMICORT) nebulizer solution  0.5 mg Nebulization Q12H  . calcium acetate  2,001 mg Oral TID WC  . calcium-vitamin D  1 tablet Oral BID WC  . chlorhexidine  15 mL Mouth Rinse BID  . docusate  100 mg Oral Q12H  . famotidine  20 mg Oral QHS  . feeding supplement (PRO-STAT SUGAR FREE 64)  60 mL Oral TID  .  fluconazole (DIFLUCAN) IV  400 mg Intravenous Q48H  . heparin subcutaneous  5,000 Units Subcutaneous Q12H  . ipratropium  2 spray Each Nare BID  . ipratropium-albuterol  3 mL Nebulization Q4H  . meropenem (MERREM) IV  500 mg Intravenous Q24H  . multivitamin  1 tablet Oral QHS  . oxyCODONE  10 mg Oral 6 times per day  . QUEtiapine  100 mg Oral Q12H  . scopolamine  1 patch Transdermal Q72H  . sennosides  10 mL Oral Q12H  . sodium chloride  5 mL Intravenous Daily  . vancomycin  1,500 mg Intravenous Q M,W,F-HD   sodium chloride, sodium chloride, acetaminophen **OR** acetaminophen, benzonatate, heparin, ibuprofen, lidocaine (PF), lidocaine-prilocaine, midazolam, pentafluoroprop-tetrafluoroeth, sodium chloride, sodium chloride, vecuronium  Assessment/ Plan:  39 y.o. Black male with End Stage Renal Disease on MWF schedule with right arm AVF/AVG followed by [facility] [address], hypertension, hyperlipidemia, Guillain-Barre syndrome, admission for fever 08/05/14  1. ESRD: MWF HD: N18.6 [place]. [facility]. - Pt due for HD today. Continue MWF schedule.   2. Acute respiratory failure with  pneumonia: J96.00, J15.9. Afebrile. Vent assisted. Bronch 4/21.   - Continue supportive care - Continue abx therapy per ID. Legionella?   3. Anemia of Chronic Kidney Disease: D63.1  Hemoglobin 6.1 today. Gets epo with hemodialysis treatment and micera as outpatient.  - 1 unit PRBC transfusion on HD.  - EPO with treatment.   5. Secondary Hyperparathyroidism:N25.81phos 7.1.  - renal tube feeds   LOS: 19 Keyle Doby 5/2/20169:53 AM

## 2014-08-25 ENCOUNTER — Inpatient Hospital Stay: Payer: Medicare Other

## 2014-08-25 LAB — BLOOD GAS, ARTERIAL
ALLENS TEST (PASS/FAIL): POSITIVE — AB
Acid-Base Excess: 0.8 mmol/L (ref 0.0–3.0)
Bicarbonate: 26 mEq/L (ref 21.0–28.0)
DRAWN BY: 187461
FIO2: 35 %
O2 SAT: 84.6 %
PATIENT TEMPERATURE: 37
PCO2 ART: 43 mmHg (ref 32.0–48.0)
pH, Arterial: 7.39 (ref 7.350–7.450)
pO2, Arterial: 50 mmHg — ABNORMAL LOW (ref 83.0–108.0)

## 2014-08-25 LAB — HEMOGLOBIN: Hemoglobin: 7.7 g/dL — ABNORMAL LOW (ref 13.0–18.0)

## 2014-08-25 LAB — VANCOMYCIN, TROUGH: VANCOMYCIN TR: 13 ug/mL (ref 10.0–20.0)

## 2014-08-25 MED ORDER — SODIUM CHLORIDE 0.9 % IV SOLN
100.0000 mL | INTRAVENOUS | Status: DC | PRN
Start: 2014-08-25 — End: 2014-08-26

## 2014-08-25 MED ORDER — VECURONIUM BROMIDE 10 MG IV SOLR
10.0000 mg | Freq: Once | INTRAVENOUS | Status: DC
Start: 2014-08-25 — End: 2014-08-26

## 2014-08-25 MED ORDER — ONDANSETRON HCL 4 MG/2ML IJ SOLN
INTRAMUSCULAR | Status: AC
  Administered 2014-08-25: 13:00:00 via INTRAVENOUS
  Filled 2014-08-25: qty 2

## 2014-08-25 MED ORDER — FENTANYL CITRATE (PF) 100 MCG/2ML IJ SOLN
INTRAMUSCULAR | Status: AC
  Administered 2014-08-25: 100 ug via INTRAVENOUS
  Filled 2014-08-25: qty 4

## 2014-08-25 MED ORDER — MIDAZOLAM HCL 2 MG/2ML IJ SOLN
4.0000 mg | Freq: Once | INTRAMUSCULAR | Status: AC
  Administered 2014-08-25: 4 mg via INTRAVENOUS

## 2014-08-25 MED ORDER — CETYLPYRIDINIUM CHLORIDE 0.05 % MT LIQD
7.0000 mL | Freq: Four times a day (QID) | OROMUCOSAL | Status: DC
  Administered 2014-08-26 (×4): 7 mL via OROMUCOSAL

## 2014-08-25 MED ORDER — MIDAZOLAM 50MG/50ML (1MG/ML) PREMIX INFUSION
1.0000 mg/h | INTRAVENOUS | Status: DC
  Administered 2014-08-25 (×2): 2 mg/h via INTRAVENOUS
  Administered 2014-08-25: 5 mg/h via INTRAVENOUS
  Administered 2014-08-25: 10 mg/h via INTRAVENOUS
  Filled 2014-08-25 (×3): qty 50

## 2014-08-25 MED ORDER — VECURONIUM BROMIDE 10 MG IV SOLR
INTRAVENOUS | Status: AC
  Administered 2014-08-25: 10 mg via INTRAVENOUS
  Filled 2014-08-25: qty 10

## 2014-08-25 MED ORDER — CHLORHEXIDINE GLUCONATE 0.12 % MT SOLN
15.0000 mL | Freq: Two times a day (BID) | OROMUCOSAL | Status: DC
  Administered 2014-08-25 – 2014-08-26 (×2): 15 mL via OROMUCOSAL

## 2014-08-25 MED ORDER — NALOXONE HCL 1 MG/ML IJ SOLN
2.0000 mg | Freq: Once | INTRAMUSCULAR | Status: AC
  Administered 2014-08-25: 2 mg via INTRAVENOUS
  Filled 2014-08-25: qty 2

## 2014-08-25 MED ORDER — CETYLPYRIDINIUM CHLORIDE 0.05 % MT LIQD
7.0000 mL | Freq: Two times a day (BID) | OROMUCOSAL | Status: DC
  Administered 2014-08-25: 7 mL via OROMUCOSAL

## 2014-08-25 MED ORDER — MIDAZOLAM HCL 2 MG/2ML IJ SOLN
INTRAMUSCULAR | Status: AC
  Filled 2014-08-25: qty 4

## 2014-08-25 MED ORDER — FENTANYL 2500MCG IN NS 250ML (10MCG/ML) PREMIX INFUSION
10.0000 ug/h | INTRAVENOUS | Status: DC
Start: 2014-08-25 — End: 2014-08-26
  Administered 2014-08-25: 20 ug/h via INTRAVENOUS
  Administered 2014-08-25: 200 ug/h via INTRAVENOUS
  Administered 2014-08-25: 40 ug/h via INTRAVENOUS
  Administered 2014-08-26 (×2): 10 ug/h via INTRAVENOUS
  Filled 2014-08-25 (×5): qty 250

## 2014-08-25 MED ORDER — SODIUM CHLORIDE 0.9 % IV SOLN: 1.0000 mg/h | INTRAVENOUS | Status: DC

## 2014-08-25 MED ORDER — FENTANYL CITRATE (PF) 100 MCG/2ML IJ SOLN: 100.0000 ug | Freq: Once | INTRAMUSCULAR | Status: DC

## 2014-08-25 NOTE — Procedures (Signed)
PROCEDURE: BRONCHOSCOPY Therapeutic Aspiration of Tracheobronchial Tree  PROCEDURE DATE: 08/25/2014  TIME: There are other unrelated non-urgent complaints, but due to the busy schedule and the amount of time I've already spent with him, time does not permit me to address these routine issues at today's visit. I've requested another appointment to review these additional issues.   NAME:  Patrick Downs  DOB:[birth year]  MRN: [id number] LOC:  IC07A/IC07A-AA    HOSP DAY: @LENGTHOFSTAYDAYS @ CODE STATUS:      Code Status Orders        Start     Ordered   08/22/14 1435  Full code   Continuous     08/22/14 1457          Indications/Preliminary Diagnosis:   Consent: (Place X beside choice/s below)  The benefits, risks and possible complications of the procedure were        explained to:  ___ patient  _x__ patient's family  ___ other:___________  who verbalized understanding and gave:  _x__ verbal  ___ written  ___ verbal and written  ___ telephone  ___ other:________ consent.      Unable to obtain consent; procedure performed on emergent basis.     Other:       PRESEDATION ASSESSMENT: History and Physical has been performed. Patient meds and allergies have been reviewed. Presedation airway examination has been performed and documented. Baseline vital signs, sedation score, oxygenation status, and cardiac rhythm were reviewed. Patient was deemed to be in satisfactory condition to undergo the procedure.  PREMEDICATIONS:   Sedative/Narcotic Amt Dose   Versed 5 mg   Fentanyl 100 mcg  Diprivan  mg        Airway Prep (Place X beside choice below)   1% Transtracheal Lidocaine Anesthetization 7 cc   Patient prepped per Bronchoscopy Lab Policy       Insertion Route (Place X beside choice below)   Nasal   Oral  x Endotracheal Tube   Tracheostomy   INTRAPROCEDURE MEDICATIONS:  Sedative/Narcotic Amt Dose   Versed  mg   Fentanyl  mcg  Diprivan  mg       Medication Amt Dose   Medication Amt Dose  Xylocaine 2%  cc  Epinephrine 1:10,000 sol  cc  Xylocaine 4%  cc  Cocaine  cc   TECHNICAL PROCEDURES: (Place X beside choice below)   Procedures  Description    None     Electrocautery     Cryotherapy     Balloon Dilatation     Bronchography     Stent Placement   x  Therapeutic Aspiration     Laser/Argon Plasma    Brachytherapy Catheter Placement    Foreign Body Removal     SPECIMENS (Sites): (Place X beside choice below)  Specimens Description   No Specimens Obtained     Washings   x Lavage RLL   Biopsies    Fine Needle Aspirates    Brushings    Sputum    FINDINGS: Thin tan Purulent secretions in lower segments of lungs, mucus plugs extracted with scope BAL taken from RLL   ESTIMATED BLOOD LOSS: none COMPLICATIONS/RESOLUTION: none   PROCEDURE DETAILS: Timeout performed and correct patient, name, & ID confirmed. Following prep per Pulmonary policy, appropriate sedation was administered.  Airway exam proceeded with findings, technical procedures, and specimen collection as noted below. At the end of exam the scope was withdrawn without incident. Impression and Plan as noted below.   IMPRESSION:POST-PROCEDURE DX: Pneumonia  RECOMMENDATION/PLAN: follow up BAL cultures      Corrin Parker, M.D. Pulmonary & [place] Director Intensive Care Unit

## 2014-08-25 NOTE — Progress Notes (Signed)
PULMONARY / CRITICAL CARE MEDICINE   Name: Patrick Downs MRN: [id number] DOB: 10/14/1975    ADMISSION DATE:  08/05/2014   SUBJECTIVE:  patient with  hypoxic resp failure, patient with progressive b/l infiltrates on CXR, patient extubated, yesterday, patient with increased WOB this afternoon, remains obtunded, patient emergently re-intubated. critically ill,  Mother at bedside updated, will place on full vent support, fio2 100% PEEP at 10  VITAL SIGNS: Temp:  [98.5 F (36.9 C)-98.9 F (37.2 C)] 98.8 F (37.1 C) (05/03 0400) Pulse Rate:  [102-116] 107 (05/03 0600) Resp:  [17-30] 25 (05/03 0600) BP: (74-123)/(48-97) 97/65 mmHg (05/03 0600) SpO2:  [89 %-100 %] 97 % (05/03 0801) FiO2 (%):  [50 %] 50 % (05/03 1151) Weight:  [289 lb 14.5 oz (131.5 kg)-293 lb 10.4 oz (133.2 kg)] 293 lb 10.4 oz (133.2 kg) (05/03 0500) HEMODYNAMICS:   VENTILATOR SETTINGS: Vent Mode:  [-]  FiO2 (%):  [50 %] 50 % INTAKE / OUTPUT:  Intake/Output Summary (Last 24 hours) at 08/25/14 1326 Last data filed at 08/25/14 0820  Gross per 24 hour  Intake 1133.77 ml  Output   1250 ml  Net -116.23 ml    PHYSICAL EXAMINATION:  GEN disheveled, critically ill appearing   HEENT pale conjunctivae, PERRL, orally intubated   NECK supple  No masses   CARD regular rate  no murmur  no thrills  No LE edema  tachy   VASCULAR ACCESS AV graft present   ABD hypoactive BS   LYMPH negative neck, negative axillae   EXTR negative edema   SKIN No rashes, skin turgor good   NEURO negative Babinski R/L, follows commands   PSYCH sedated   RESP +use of accessory muscles, now on vent    LABS:  CBC  Recent Labs Lab 08/22/14 0653 08/23/14 0725  08/24/14 1500 08/24/14 2000 08/25/14 0501  WBC 8.4 10.5  --  7.3  --   --   HGB 7.7* 7.0*  < > 6.6* 8.0* 7.7*  HCT 23.9* 21.7*  --  20.4* 24.6*  --   PLT 195 218  --  187  --   --   < > = values in this interval not displayed. Coag's No results for input(s):  APTT, INR in the last 168 hours. BMET  Recent Labs Lab 08/22/14 0653 08/23/14 0725 08/24/14 1500  NA 136 135 136  K 5.1 5.2* 5.9*  CL 95* 94* 95*  CO2 27 27 23   BUN 71* 81* 97*  CREATININE 8.87* 10.52* 12.78*  GLUCOSE 176* 149* 94   Electrolytes  Recent Labs Lab 08/22/14 0653 08/23/14 0725 08/24/14 1500  CALCIUM 7.1* 6.7* 6.6*  MG  --  2.6*  --   PHOS  --  7.1* 9.3*   Sepsis Markers No results for input(s): LATICACIDVEN, PROCALCITON, O2SATVEN in the last 168 hours. ABG  Recent Labs Lab 08/24/14 1135  PHART 7.39  PCO2ART 43  PO2ART 50*   Liver Enzymes  Recent Labs Lab 08/19/14 0616 08/23/14 0725 08/24/14 1500  AST 61* 68*  --   ALT 63 58  --   ALKPHOS 57 79  --   BILITOT  --  0.7  --   ALBUMIN 2.1* 2.0* 2.0*   Cardiac Enzymes No results for input(s): TROPONINI, PROBNP in the last 168 hours. Glucose  Recent Labs Lab 08/24/14 0729  GLUCAP 94    Imaging No results found.   ASSESSMENT / PLAN: 39 year old AAM  patient with ESRD on hemodialysis with the  cough, fever, and lactic acidosis with acute metabolic encephlopathy with fevers with acute septic shock with progressive pneumonia and ARDS candidal albicans pneumonia, now with gram negative pneumonia-patient failed trial of extubation -re intubated due to resp muscle fatigue and ineffective ventilation with hypoxia   1.septic shock due to right ML pneumonia with progressive b/l infiltrates. with hypotension /tachycardia  -use vasopressors to keep MAP>60 as needed -follow up ID recs-on antifungal therapy and meropenen/Vanc -get AB\G and LA as needed  2.Pneumonia with Resp failure-patient reintubated -on vent support -will plan for repeat Bronch -chest CT with progressive infiltrates bilateraly, ARDS appearing.  -gram negative species in sputum cultures  3.fevers-with encephalopathy - pneumonia  -follow up Id recs-antifungal therapy and meropenem -U\S of UE with left UE thrombosed dialysis  fistula - possibly chronic  4.ESRD on HD -follow up nephrology recs  5.respiratory failure-ARDS-re-intubated for increased WOB and SOB -continue full vent support -follow up abg and cxr as needed -wean fio2 and peep as tolerated -Low lung volume strategy  6.Delerium will consider restarting meds -xanax -seroquel -oxycodone  Will plan for [facility] referral for continued medical care    I have personally obtained a history, examined the patient, evaluated Pertinent laboratory and [employer]/imaging results, and  formulated the assessment and plan   The Patient requires high complexity decision making for assessment and support, frequent evaluation and titration of therapies, application of advanced monitoring technologies and extensive interpretation of multiple databases. Critical Care Time devoted to patient care services described in this note is 50 minutes.   Overall, patient is critically ill, prognosis is guarded.  Patient with Multiorgan failure and at high risk for cardiac arrest and death.    Corrin Parker, M.D. Pulmonary & [place] Director Intensive Care Unit

## 2014-08-25 NOTE — Care Management (Signed)
Patient required reintubation today.  LTAC is now assessing for criteria

## 2014-08-25 NOTE — Procedures (Signed)
Endotracheal Intubation: Patient required placement of an artificial airway secondary to resp failure.   Consent: Emergent.   Hand washing performed prior to starting the procedure.   Medications administered for sedation prior to procedure: Midazolam 0.2-0.3mg /kg IVP; Vecuronium 0.1-0.2 mg/kg IVP; Fentanyl 58mcg/kg IVP.   Procedure: A time out procedure was called and correct patient, name, & ID confirmed. Needed supplies and equipment were assembled and checked to include ETT, 10 ml syringe, using  Glidescope. suction, oxygen and bag mask valve, end tidal CO2 monitor. Patient was positioned to align the mouth and pharynx to facilitate visualization of the glottis.  Heart rate, SpO2 and blood pressure was continuously monitored during the procedure. Pre-oxygenation was conducted prior to intubation and endotracheal tube was placed through the vocal cords into the trachea.  During intubation an assistant applied gentle pressure to the cricoid cartilage.   The artificial airway was placed under direct visualization via glidescope route using a 8.5 ETT on the first attempt.    ETT was secured at 23 cm mark.    Placement was confirmed by auscuitation of lungs with good breath sounds bilaterally and no stomach sounds.  Condensation was noted on endotracheal tube.  Pulse ox 100%.  CO2 detector in place with appropriate color change.   Complications: None .   Operator: Jaylynn Mcaleer.   Chest radiograph ordered and pending.   Comments: OGT placed via glidescope.  Corrin Parker, M.D. Pulmonary & [place] Director Intensive Care Unit

## 2014-08-25 NOTE — Progress Notes (Signed)
Vancomycin Dosing: (Day 18), Meropenem (Day 4),  ESRD,  Constipation, Electrolytes. ConstipationPharmacy consulted to dose vancomycin for this 39yo M being on HD MWF schedule presenting with fever. Patient with possible endovascular infections vs atypical infection. Empiric tamiflu ordered (stop date 4/23).  Patient initiated on clindamycin 600mg  IV Q8hr. Patient's acyclovir discontinued on 4/22. Patient initiated on micafungin 150mg  IV daily on 4/22. Allergies: IVP Dye, Minoxidil, Morphine, OmnipaqueHD MWF schedule, Wt: 116kg, IBW: 82kg, Dosing Wt: 95.9kg.    Goal vanc trough: 15-25 4/25: Vanc trough is 10  Labs are WNL at this time. Will continue patient on docusate 100mg  VT BID and senna 71mL VT Q12hr and add bisacodyl suppository x 1. 4/25:    Meropenem 1000 mg IV Q24H originally ordered.  Will adjust dose toMeropenem 500 mg IV Q24H. Pharmacy will follow and adjust per consult.  Will increase vancomycin to 1500mg  IV to be infused during last hour of dialysis on dialysis days. Conditional vanc level due 5/2 not obtained by lab. Will order vancomycin level 5/3 at 1700hrs to determine need for supplemental dose or adequacy of maintenance dose.

## 2014-08-25 NOTE — Progress Notes (Signed)
Patient ID: Patrick Downs, male   DOB: [birth year], 39 y.o.   MRN: [id number]  [facility] PROGRESS NOTE  DOA: 08/05/2014 PCP: No primary care provider on file.  HPI/Subjective: Patient was extubated yesterday. Still lethargic. When stimulated he does open his eyes but unable to talk. As per his mother was able to talk a little bit but hard to understand. Patient unable to give any history at this point.  Objective: Filed Vitals:   08/25/14 0600  BP: 97/65  Pulse: 107  Temp:   Resp: 25    Intake/Output Summary (Last 24 hours) at 08/25/14 1236 Last data filed at 08/25/14 0820  Gross per 24 hour  Intake 1133.77 ml  Output   1250 ml  Net -116.23 ml   Filed Weights   08/24/14 0435 08/24/14 1845 08/25/14 0500  Weight: 132.7 kg (292 lb 8.8 oz) 131.5 kg (289 lb 14.5 oz) 133.2 kg (293 lb 10.4 oz)    ROS: ROS secondary to lethargy Exam: Physical Exam  Constitutional: He appears lethargic.  HENT:  Nose: No mucosal edema.  Mouth/Throat: No oropharyngeal exudate or posterior oropharyngeal edema.  Eyes: Conjunctivae, EOM and lids are normal. Pupils are equal, round, and reactive to light.  Neck: No JVD present. Carotid bruit is not present. No edema present. No thyroid mass and no thyromegaly present.  Cardiovascular: S1 normal and S2 normal.  Exam reveals no gallop.   No murmur heard. Pulses:      Dorsalis pedis pulses are 1+ on the right side, and 1+ on the left side.  Respiratory: No respiratory distress. He has no wheezes. He has rhonchi in the right upper field, the right lower field, the left upper field, the left middle field and the left lower field. He has no rales.  GI: Soft. Bowel sounds are normal. There is no tenderness.  Lymphadenopathy:    He has no cervical adenopathy.  Neurological: He appears lethargic.  Skin: Skin is warm. No rash noted. Nails show no clubbing.   psychiatric: unable to test, lethargy Data Reviewed: Basic Metabolic  Panel:  Recent Labs Lab 08/19/14 0616 08/20/14 0417 08/22/14 0653 08/23/14 0725 08/24/14 1500  NA SEE COMMENT 136 136 135 136  K 4.5 5.0 5.1 5.2* 5.9*  CL 94* 94* 95* 94* 95*  CO2 27 28 27 27 23   GLUCOSE 124* 161* 176* 149* 94  BUN 82* 64* 71* 81* 97*  CREATININE 11.58* 8.67* 8.87* 10.52* 12.78*  CALCIUM 6.9* 6.9* 7.1* 6.7* 6.6*  MG  --   --   --  2.6*  --   PHOS  --   --   --  7.1* 9.3*   Liver Function Tests:  Recent Labs Lab 08/19/14 0616 08/23/14 0725 08/24/14 1500  AST 61* 68*  --   ALT 63 58  --   ALKPHOS 57 79  --   BILITOT  --  0.7  --   PROT SEE COMMENT 6.4*  --   ALBUMIN 2.1* 2.0* 2.0*   CBC:  Recent Labs Lab 08/19/14 0616 08/20/14 0417 08/22/14 0653 08/23/14 0725 08/24/14 0430 08/24/14 1500 08/24/14 2000 08/25/14 0501  WBC 9.3 8.9 8.4 10.5  --  7.3  --   --   NEUTROABS 7.8* 7.4* 5.7 7.5*  --   --   --   --   HGB 7.7* 8.1* 7.7* 7.0* 6.1* 6.6* 8.0* 7.7*  HCT 22.8* 24.0* 23.9* 21.7*  --  20.4* 24.6*  --   MCV 92 91  92 91.0  --  90.9  --   --   PLT 208 245 195 218  --  187  --   --         Studies:   Scheduled Meds: . antiseptic oral rinse  7 mL Mouth Rinse BID  . budesonide (PULMICORT) nebulizer solution  0.5 mg Nebulization Q12H  . calcium acetate  2,001 mg Oral TID WC  . calcium-vitamin D  1 tablet Oral BID WC  . docusate  100 mg Oral Q12H  . famotidine  20 mg Oral QHS  . fluconazole (DIFLUCAN) IV  400 mg Intravenous Q48H  . heparin subcutaneous  5,000 Units Subcutaneous Q12H  . ipratropium  2 spray Each Nare BID  . ipratropium-albuterol  3 mL Nebulization Q4H  . meropenem (MERREM) IV  500 mg Intravenous Q24H  . multivitamin  1 tablet Oral QHS  . naLOXone (NARCAN)  injection  2 mg Intravenous Once  . scopolamine  1 patch Transdermal Q72H  . sennosides  10 mL Oral Q12H  . sodium chloride  5 mL Intravenous Daily  . vancomycin  1,500 mg Intravenous Q M,W,F-HD   Continuous Infusions: . norepinephrine (LEVOPHED) Adult infusion  Stopped (08/25/14 0400)    Assessment/Plan: #1 septic shock with bilateral pneumonia and ARDS with fever -Antibiotics as per infectious disease #2 acute respiratory failure due to bilateral pneumonia and ARDS -Patient extubated yesterday. Patient is still lethargic. We will give a dose of 2 mg of Narcan just in case too much pain medication is still in his system and see what happens. Close clinical follow-up needed here in the ICU. High risk for reintubation if mental status does not improve. Continue oxygen supplementation. Case discussed with Dr. Mortimer Fries pulmonary. -High risk for reintubation. #3 acute encephalopathy -still has not improved. We'll try to give Narcan and see what happens. May end up needing a CT scan of the head. #4 pancytopenia -Hemoglobin up to 7.7 after transfusion yesterday. -And neutropenia and thrombocytopenia improved #5 neuropathy #6 end-stage renal disease -Continue hemodialysis as per nephrology.   Code Status:     Code Status Orders        Start     Ordered   08/22/14 1435  Full code   Continuous     08/22/14 1457     Family Communication: Spoke with mother at the bedside. Disposition Plan: To be determined.  Time spent: 54min  [name]  [facility]

## 2014-08-25 NOTE — Progress Notes (Signed)
[facility]  ROUNDING NOTE   Subjective:   Hemodialysis yesterday. No Ultrafiltration.  Extubated yesterday. Weaned off vasopressors.  Transfusion 1 unit PRBC yesterday. Wife at bedside.    Objective:  Vital signs in last 24 hours:  Temp:  [98.5 F (36.9 C)-99.6 F (37.6 C)] 98.8 F (37.1 C) (05/03 0400) Pulse Rate:  [102-116] 107 (05/03 0600) Resp:  [17-30] 25 (05/03 0600) BP: (74-123)/(48-97) 97/65 mmHg (05/03 0600) SpO2:  [89 %-100 %] 97 % (05/03 0801) FiO2 (%):  [50 %] 50 % (05/03 0801) Weight:  [131.5 kg (289 lb 14.5 oz)-133.2 kg (293 lb 10.4 oz)] 133.2 kg (293 lb 10.4 oz) (05/03 0500)  Weight change: -1.2 kg (-2 lb 10.3 oz) Filed Weights   08/24/14 0435 08/24/14 1845 08/25/14 0500  Weight: 132.7 kg (292 lb 8.8 oz) 131.5 kg (289 lb 14.5 oz) 133.2 kg (293 lb 10.4 oz)    Intake/Output: I/O last 3 completed shifts: In: 1973.4 [I.V.:1353.4; Blood:330; NG/GT:290] Out: 2150 [Emesis/NG output:900; Other:1250]   Intake/Output this shift:  Total I/O In: 360 [P.O.:360] Out: -   General: critically ill, venturi mask Head: , eyes closed.  Neck: trachea midline, supple CVS: tachycardia Resp: bilateral wheezes, rhonchi ABD: mild distension EXT: +peripheral edema Neuro: lethargic Access: Access: right arm AVG with fistular component.    Basic Metabolic Panel:  Recent Labs Lab 08/19/14 0616 08/20/14 0417 08/22/14 0653 08/23/14 0725 08/24/14 1500  NA SEE COMMENT 136 136 135 136  K 4.5 5.0 5.1 5.2* 5.9*  CL 94* 94* 95* 94* 95*  CO2 27 28 27 27 23   GLUCOSE 124* 161* 176* 149* 94  BUN 82* 64* 71* 81* 97*  CREATININE 11.58* 8.67* 8.87* 10.52* 12.78*  CALCIUM 6.9* 6.9* 7.1* 6.7* 6.6*  MG  --   --   --  2.6*  --   PHOS  --   --   --  7.1* 9.3*    Liver Function Tests:  Recent Labs Lab 08/19/14 0616 08/23/14 0725 08/24/14 1500  AST 61* 68*  --   ALT 63 58  --   ALKPHOS 57 79  --   BILITOT  --  0.7  --   PROT SEE COMMENT 6.4*  --   ALBUMIN  2.1* 2.0* 2.0*   No results for input(s): LIPASE, AMYLASE in the last 168 hours. No results for input(s): AMMONIA in the last 168 hours.  CBC:  Recent Labs Lab 08/19/14 0616 08/20/14 0417 08/22/14 0653 08/23/14 0725 08/24/14 0430 08/24/14 1500 08/24/14 2000 08/25/14 0501  WBC 9.3 8.9 8.4 10.5  --  7.3  --   --   NEUTROABS 7.8* 7.4* 5.7 7.5*  --   --   --   --   HGB 7.7* 8.1* 7.7* 7.0* 6.1* 6.6* 8.0* 7.7*  HCT 22.8* 24.0* 23.9* 21.7*  --  20.4* 24.6*  --   MCV 92 91 92 91.0  --  90.9  --   --   PLT 208 245 195 218  --  187  --   --     Cardiac Enzymes: No results for input(s): CKTOTAL, CKMB, CKMBINDEX, TROPONINI in the last 168 hours.  BNP: Invalid input(s): POCBNP  CBG:  Recent Labs Lab 08/24/14 0729  GLUCAP 94    Microbiology: Results for orders placed or performed during the hospital encounter of 08/05/14  Influenza A&B Antigens [facility])     Status: None   Collection Time: 08/05/14  5:49 PM  Result Value Ref Range Status  Micro Text Report   Final       COMMENT                   NEGATIVE FOR INFLUENZA A (ANTIGEN ABSENT)   COMMENT                   NEGATIVE FOR INFLUENZA B (ANTIGEN ABSENT)   ANTIBIOTIC                                                      Culture, blood (single)     Status: None   Collection Time: 08/05/14  6:26 PM  Result Value Ref Range Status   Micro Text Report   Final       COMMENT                   NO GROWTH AEROBICALLY/ANAEROBICALLY IN 5 DAYS   ANTIBIOTIC                                                      Culture, blood (single)     Status: None   Collection Time: 08/05/14  6:41 PM  Result Value Ref Range Status   Micro Text Report   Final       COMMENT                   NO GROWTH AEROBICALLY/ANAEROBICALLY IN 5 DAYS   ANTIBIOTIC                                                      Culture, blood (single)     Status: None   Collection Time: 08/05/14 10:15 PM  Result Value Ref Range Status   Micro Text Report   Final        COMMENT                   NO GROWTH AEROBICALLY/ANAEROBICALLY IN 5 DAYS   ANTIBIOTIC                                                      Culture, blood (single)     Status: None   Collection Time: 08/07/14 10:30 AM  Result Value Ref Range Status   Micro Text Report   Final       COMMENT                   NO GROWTH AEROBICALLY/ANAEROBICALLY IN 5 DAYS   ANTIBIOTIC                                                      Culture, blood (single)     Status: None  Collection Time: 08/07/14 10:43 AM  Result Value Ref Range Status   Micro Text Report   Final       COMMENT                   NO GROWTH AEROBICALLY/ANAEROBICALLY IN 5 DAYS   ANTIBIOTIC                                                      Culture, blood (single)     Status: None   Collection Time: 08/10/14  6:43 PM  Result Value Ref Range Status   Micro Text Report   Final       COMMENT                   NO GROWTH AEROBICALLY/ANAEROBICALLY IN 5 DAYS   ANTIBIOTIC                                                      Culture, blood (single)     Status: None   Collection Time: 08/10/14  6:43 PM  Result Value Ref Range Status   Micro Text Report   Final       COMMENT                   NO GROWTH AEROBICALLY/ANAEROBICALLY IN 5 DAYS   ANTIBIOTIC                                                      Culture, blood (single)     Status: None   Collection Time: 08/13/14 12:40 AM  Result Value Ref Range Status   Micro Text Report   Final       COMMENT                   NO GROWTH AEROBICALLY/ANAEROBICALLY IN 5 DAYS   ANTIBIOTIC                                                      Culture, blood (single)     Status: None   Collection Time: 08/13/14 12:50 AM  Result Value Ref Range Status   Micro Text Report   Final       COMMENT                   NO GROWTH AEROBICALLY/ANAEROBICALLY IN 5 DAYS   ANTIBIOTIC                                                      Culture, fungus without smear     Status: None  (Preliminary result)   Collection Time: 08/13/14  8:40 AM  Result Value  Ref Range Status   Micro Text Report   Preliminary       SOURCE: BRONCH WASHING    ORGANISM 1                Candida albicans   COMMENT                   -   COMMENT                   -   ANTIBIOTIC                    ORG#1                                               Bronchial Wash Culture     Status: None   Collection Time: 08/13/14  8:40 AM  Result Value Ref Range Status   Micro Text Report   Final       ORGANISM 1                LIGHT GROWTH Candida albicans   COMMENT                   -   ANTIBIOTIC                    ORG#1                                               Influenza A,B,H1N1 - PCR [facility])     Status: None   Collection Time: 08/15/14  2:20 PM  Result Value Ref Range Status   Influenza A By PCR NEGATIVE NEGATIVE Final   Influenza B By PCR NEGATIVE NEGATIVE Final   H1N1 flu by pcr NOT DETECTED NOT-DETECTED Final    Comment:                  ----------------------- The Xpert Flu assay (FDA approval for nasal aspirates or washes and nasopharyngeal swab specimens), is intended as an aid in the diagnosis of influenza and should not be used as a sole basis for treatment.   Culture, expectorated sputum-assessment     Status: None   Collection Time: 08/21/14 12:36 PM  Result Value Ref Range Status   Micro Text Report   Final       SOURCE: ETS    COMMENT                   APPEARS TO BE NORMAL FLORA AT 48 HOURS   GRAM STAIN                FEW WHITE BLOOD CELLS RARE GRAM NEGATIVE DIPLOCOCCI   GRAM STAIN                RARE GRAM POSITIVE COCCI   GRAM STAIN                GOOD SPECIMEN-80-90% WBC   GRAM STAIN                RARE GRAM NEGATIVE COCCO-BACILLI   ANTIBIOTIC  Culture, blood (single)     Status: None (Preliminary result)   Collection Time: 08/21/14  3:31 PM  Result Value Ref Range Status   Micro Text Report   Preliminary        COMMENT                   NO GROWTH IN 36 HOURS   ANTIBIOTIC                                                      Culture, blood (single)     Status: None (Preliminary result)   Collection Time: 08/21/14  5:31 PM  Result Value Ref Range Status   Micro Text Report   Preliminary       COMMENT                   NO GROWTH IN 36 HOURS   ANTIBIOTIC                                                        Coagulation Studies: No results for input(s): LABPROT, INR in the last 72 hours.  Urinalysis: No results for input(s): COLORURINE, LABSPEC, PHURINE, GLUCOSEU, HGBUR, BILIRUBINUR, KETONESUR, PROTEINUR, UROBILINOGEN, NITRITE, LEUKOCYTESUR in the last 72 hours.  Invalid input(s): APPERANCEUR    Imaging: No results found.   Medications:   . norepinephrine (LEVOPHED) Adult infusion Stopped (08/25/14 0400)   . antiseptic oral rinse  7 mL Mouth Rinse BID  . budesonide (PULMICORT) nebulizer solution  0.5 mg Nebulization Q12H  . calcium acetate  2,001 mg Oral TID WC  . calcium-vitamin D  1 tablet Oral BID WC  . docusate  100 mg Oral Q12H  . famotidine  20 mg Oral QHS  . fluconazole (DIFLUCAN) IV  400 mg Intravenous Q48H  . heparin subcutaneous  5,000 Units Subcutaneous Q12H  . ipratropium  2 spray Each Nare BID  . ipratropium-albuterol  3 mL Nebulization Q4H  . meropenem (MERREM) IV  500 mg Intravenous Q24H  . multivitamin  1 tablet Oral QHS  . scopolamine  1 patch Transdermal Q72H  . sennosides  10 mL Oral Q12H  . sodium chloride  5 mL Intravenous Daily  . vancomycin  1,500 mg Intravenous Q M,W,F-HD   acetaminophen **OR** acetaminophen, benzonatate, heparin, ibuprofen, lidocaine (PF), lidocaine-prilocaine, pentafluoroprop-tetrafluoroeth, sodium chloride, sodium chloride  Assessment/ Plan:  39 y.o. Black male with End Stage Renal Disease on MWF schedule with right arm AVF/AVG followed by [facility] [address], hypertension, hyperlipidemia, Guillain-Barre syndrome,  admission for fever 08/05/14  1. ESRD: MWF HD: N18.6 [place]. - Tolerated hemodialysis yesterday. UF of 0. Plan for next treatment tomorrow. Will plan on 2 -3 litres of infiltration as hemodynamically stable. Continue to   2. Acute respiratory failure with pneumonia: J96.00, J15.9. Afebrile. Extubated yesterday.  - Continue supportive care: discussed case with respiratory therapy.  - Continue abx therapy per ID. Afebrile.   3. Anemia of Chronic Kidney Disease: D63.1  Hemoglobin improved, status post PRBC transfusion on 5/2.  Gets epo with hemodialysis treatment and micera as outpatient.  - 1 unit PRBC transfusion on  HD.  - EPO with treatment.   5. Secondary Hyperparathyroidism:N25.81phos 7.1.  - restart binders when taking PO. Calcium acetate 3 tabs with meals.    LOS: [place], Anhad Sheeley 5/3/201611:01 AM

## 2014-08-26 ENCOUNTER — Ambulatory Visit (HOSPITAL_COMMUNITY)
Admission: AD | Admit: 2014-08-26 | Discharge: 2014-08-26 | Disposition: A | Discharge status: Home / Self Care | Payer: Medicare Other | Source: Other Acute Inpatient Hospital | Attending: Internal Medicine | Admitting: Internal Medicine

## 2014-08-26 ENCOUNTER — Inpatient Hospital Stay
Admission: AD | Admit: 2014-08-26 | Discharge: 2014-10-05 | Disposition: A | Discharge status: Home / Self Care | Payer: Medicare Other | Source: Ambulatory Visit | Attending: Internal Medicine | Admitting: Internal Medicine

## 2014-08-26 ENCOUNTER — Institutional Professional Consult (permissible substitution) (HOSPITAL_COMMUNITY): Payer: Self-pay

## 2014-08-26 DIAGNOSIS — Z9911 Dependence on respirator [ventilator] status: Secondary | ICD-10-CM | POA: Insufficient documentation

## 2014-08-26 DIAGNOSIS — K9423 Gastrostomy malfunction: Secondary | ICD-10-CM

## 2014-08-26 DIAGNOSIS — Z992 Dependence on renal dialysis: Secondary | ICD-10-CM

## 2014-08-26 DIAGNOSIS — Z0189 Encounter for other specified special examinations: Secondary | ICD-10-CM | POA: Insufficient documentation

## 2014-08-26 DIAGNOSIS — J189 Pneumonia, unspecified organism: Secondary | ICD-10-CM | POA: Diagnosis present

## 2014-08-26 DIAGNOSIS — N186 End stage renal disease: Secondary | ICD-10-CM

## 2014-08-26 DIAGNOSIS — G610 Guillain-Barre syndrome: Secondary | ICD-10-CM

## 2014-08-26 DIAGNOSIS — R6521 Severe sepsis with septic shock: Secondary | ICD-10-CM

## 2014-08-26 DIAGNOSIS — J80 Acute respiratory distress syndrome: Secondary | ICD-10-CM | POA: Diagnosis present

## 2014-08-26 DIAGNOSIS — K9420 Gastrostomy complication, unspecified: Secondary | ICD-10-CM

## 2014-08-26 DIAGNOSIS — J9600 Acute respiratory failure, unspecified whether with hypoxia or hypercapnia: Secondary | ICD-10-CM | POA: Diagnosis present

## 2014-08-26 DIAGNOSIS — I959 Hypotension, unspecified: Secondary | ICD-10-CM | POA: Diagnosis present

## 2014-08-26 DIAGNOSIS — Z4659 Encounter for fitting and adjustment of other gastrointestinal appliance and device: Secondary | ICD-10-CM | POA: Insufficient documentation

## 2014-08-26 DIAGNOSIS — R0603 Acute respiratory distress: Secondary | ICD-10-CM

## 2014-08-26 DIAGNOSIS — I770 Arteriovenous fistula, acquired: Secondary | ICD-10-CM | POA: Diagnosis present

## 2014-08-26 DIAGNOSIS — J9690 Respiratory failure, unspecified, unspecified whether with hypoxia or hypercapnia: Secondary | ICD-10-CM | POA: Insufficient documentation

## 2014-08-26 DIAGNOSIS — A419 Sepsis, unspecified organism: Secondary | ICD-10-CM | POA: Diagnosis present

## 2014-08-26 DIAGNOSIS — R509 Fever, unspecified: Secondary | ICD-10-CM | POA: Diagnosis present

## 2014-08-26 DIAGNOSIS — Z931 Gastrostomy status: Secondary | ICD-10-CM

## 2014-08-26 HISTORY — DX: Respiratory failure, unspecified, unspecified whether with hypoxia or hypercapnia: J96.90

## 2014-08-26 HISTORY — DX: Pneumonia, unspecified organism: J18.9

## 2014-08-26 HISTORY — DX: Guillain-Barre syndrome: G61.0

## 2014-08-26 HISTORY — DX: Adult failure to thrive: R62.7

## 2014-08-26 LAB — GLUCOSE, CAPILLARY: Glucose-Capillary: 93 mg/dL (ref 70–99)

## 2014-08-26 MED ORDER — IPRATROPIUM-ALBUTEROL 0.5-2.5 (3) MG/3ML IN SOLN: 3.0000 mL | RESPIRATORY_TRACT | Status: DC

## 2014-08-26 MED ORDER — SENNOSIDES 8.8 MG/5ML PO SYRP: 10.0000 mL | ORAL_SOLUTION | Freq: Two times a day (BID) | ORAL | Status: DC

## 2014-08-26 MED ORDER — CHLORHEXIDINE GLUCONATE 0.12 % MT SOLN: 15.0000 mL | Freq: Two times a day (BID) | OROMUCOSAL | Status: DC

## 2014-08-26 MED ORDER — RENA-VITE PO TABS: 1.0000 | ORAL_TABLET | Freq: Every day | ORAL | Status: DC

## 2014-08-26 MED ORDER — LIDOCAINE-PRILOCAINE 2.5-2.5 % EX CREA: 1.0000 "application " | TOPICAL_CREAM | CUTANEOUS | Status: DC | PRN

## 2014-08-26 MED ORDER — NOREPINEPHRINE BITARTRATE 1 MG/ML IV SOLN: 2.0000 ug/min | INTRAVENOUS | Status: DC

## 2014-08-26 MED ORDER — PENTAFLUOROPROP-TETRAFLUOROETH EX AERO: 1.0000 "application " | INHALATION_SPRAY | CUTANEOUS | Status: DC | PRN

## 2014-08-26 MED ORDER — IPRATROPIUM BROMIDE 0.06 % NA SOLN: 2.0000 | Freq: Two times a day (BID) | NASAL | Status: DC

## 2014-08-26 MED ORDER — BENZONATATE 100 MG PO CAPS: 100.0000 mg | ORAL_CAPSULE | Freq: Four times a day (QID) | ORAL | Status: DC | PRN

## 2014-08-26 MED ORDER — VECURONIUM BROMIDE 10 MG IV SOLR: 10.0000 mg | Freq: Once | INTRAVENOUS | Status: DC

## 2014-08-26 MED ORDER — SODIUM CHLORIDE 0.9 % IJ SOLN: 3.0000 mL | INTRAMUSCULAR | Status: DC | PRN

## 2014-08-26 MED ORDER — FLUCONAZOLE IN SODIUM CHLORIDE 400-0.9 MG/200ML-% IV SOLN: 400.0000 mg | INTRAVENOUS | Status: DC

## 2014-08-26 MED ORDER — DOCUSATE SODIUM 50 MG/5ML PO LIQD: 100.0000 mg | Freq: Two times a day (BID) | ORAL | Status: DC

## 2014-08-26 MED ORDER — FAMOTIDINE 20 MG PO TABS: 20.0000 mg | ORAL_TABLET | Freq: Every day | ORAL | Status: DC

## 2014-08-26 MED ORDER — ACETAMINOPHEN 325 MG PO TABS: 650.0000 mg | ORAL_TABLET | ORAL | Status: DC | PRN

## 2014-08-26 MED ORDER — MIDAZOLAM 50MG/50ML (1MG/ML) PREMIX INFUSION: 1.0000 mg/h | INTRAVENOUS | Status: DC

## 2014-08-26 MED ORDER — CALCIUM CARBONATE-VITAMIN D 500-200 MG-UNIT PO TABS: 1.0000 | ORAL_TABLET | Freq: Two times a day (BID) | ORAL | Status: DC

## 2014-08-26 MED ORDER — HEPARIN SODIUM (PORCINE) 1000 UNIT/ML DIALYSIS: 1000.0000 [IU] | INTRAMUSCULAR | Status: DC | PRN

## 2014-08-26 MED ORDER — SODIUM CHLORIDE 0.9 % IJ SOLN: 10.0000 mL | INTRAMUSCULAR | Status: DC | PRN

## 2014-08-26 MED ORDER — CALCIUM ACETATE (PHOS BINDER) 667 MG PO CAPS: 2001.0000 mg | ORAL_CAPSULE | Freq: Three times a day (TID) | ORAL | Status: DC

## 2014-08-26 MED ORDER — ALPRAZOLAM 1 MG PO TABS
1.0000 mg | ORAL_TABLET | Freq: Four times a day (QID) | ORAL | Status: DC
Start: 2014-08-26 — End: 2014-08-26
  Administered 2014-08-26 (×2): 1 mg via ORAL
  Filled 2014-08-26 (×2): qty 1

## 2014-08-26 MED ORDER — QUETIAPINE FUMARATE 100 MG PO TABS
100.0000 mg | ORAL_TABLET | Freq: Two times a day (BID) | ORAL | Status: DC
  Administered 2014-08-26: 100 mg via ORAL
  Filled 2014-08-26: qty 1

## 2014-08-26 MED ORDER — SODIUM CHLORIDE 0.9 % IV SOLN
1750.0000 mg | INTRAVENOUS | Status: DC
  Administered 2014-08-26: 1750 mg via INTRAVENOUS
  Filled 2014-08-26 (×2): qty 1750

## 2014-08-26 MED ORDER — SODIUM CHLORIDE 0.9 % IJ SOLN: 10.0000 mL | Freq: Two times a day (BID) | INTRAMUSCULAR | Status: DC

## 2014-08-26 MED ORDER — CETYLPYRIDINIUM CHLORIDE 0.05 % MT LIQD: 7.0000 mL | Freq: Four times a day (QID) | OROMUCOSAL | Status: DC

## 2014-08-26 MED ORDER — SODIUM CHLORIDE 0.9 % IV SOLN: 500.0000 mg | INTRAVENOUS | Status: DC

## 2014-08-26 MED ORDER — LIDOCAINE HCL (PF) 1 % IJ SOLN: 5.0000 mL | INTRAMUSCULAR | Status: DC | PRN

## 2014-08-26 MED ORDER — FENTANYL CITRATE (PF) 100 MCG/2ML IJ SOLN: 100.0000 ug | Freq: Once | INTRAMUSCULAR | Status: DC

## 2014-08-26 MED ORDER — BUDESONIDE 0.5 MG/2ML IN SUSP: 0.5000 mg | Freq: Two times a day (BID) | RESPIRATORY_TRACT | Status: DC

## 2014-08-26 MED ORDER — VANCOMYCIN HCL 10 G IV SOLR: 1750.0000 mg | INTRAVENOUS | Status: DC

## 2014-08-26 MED ORDER — FENTANYL 2500MCG IN NS 250ML (10MCG/ML) PREMIX INFUSION: 30.0000 ug/h | INTRAVENOUS | Status: DC

## 2014-08-26 MED ORDER — HEPARIN SODIUM (PORCINE) 5000 UNIT/ML IJ SOLN: 5000.0000 [IU] | Freq: Two times a day (BID) | INTRAMUSCULAR | Status: DC

## 2014-08-26 MED ORDER — SCOPOLAMINE 1 MG/3DAYS TD PT72: 1.0000 | MEDICATED_PATCH | TRANSDERMAL | Status: DC

## 2014-08-26 MED ORDER — IBUPROFEN 400 MG PO TABS: 400.0000 mg | ORAL_TABLET | Freq: Four times a day (QID) | ORAL | Status: DC | PRN

## 2014-08-26 NOTE — Discharge Summary (Signed)
Physician Discharge Summary  Patrick Downs [id number] DOB: 1976/01/13 DOA: 08/05/2014  PCP: No primary care provider on file.  Admit date: 08/05/2014 Discharge date: 08/26/2014  Time spent: 50 minutes  Recommendations for Outpatient Follow-up:  1. [facility]  Discharge Diagnoses:  Active Problems:   Sepsis   CAP (community acquired pneumonia)   ESRD on dialysis   A-V fistula   ARDS (adult respiratory distress syndrome)   Septic shock   Hypotension   Acute respiratory failure   Fever   Discharge Condition: Critical  Diet recommendation: Nothing by mouth  Filed Weights   08/24/14 1845 08/25/14 0500 08/26/14 0500  Weight: 131.5 kg (289 lb 14.5 oz) 133.2 kg (293 lb 10.4 oz) 132.6 kg (292 lb 5.3 oz)    Hospital Course:  Please see interim summary dictated by Dr. Bridgett Larsson on 08/21/2014 for hospital course up until that point. #1 septic shock with bilateral pneumonia and ARDS with fever. -The patient has been on antibiotics as per infectious disease. #2 acute respiratory failure due to bilateral pneumonia and ARDS. -The patient was extubated on 08/24/2014 and then required reintubation on 08/25/2014. Part of the issue was he was still very lethargic. A dose of Narcan was given because he did not wake up. After that required reintubation. -The patient is down to 40% FiO2 on the ventilator. -The patient will be dialyzed today to remove excess fluid. -The patient is on fentanyl and Versed drips to maintain sedation. And because of this he is on Levophed drip to maintain blood pressure. -hopefully he will be able to be extubated soon. #3 acute encephalopathy. -The patient was following commands for me today even on sedation. #4 pancytopenia -Patient received a transfusion the other day, last hemoglobin up to 7.7 #5 end-stage renal disease -Nephrology plans on dialyzing him today before [facility]. And recommended dialyzing again tomorrow to remove excess  fluid.  Case discussed with nephrology and pulmonology and care management team.      Discharge Exam: Filed Vitals:   08/26/14 1000  BP: 91/51  Pulse: 94  Temp:   Resp: 20    Physical Exam  HENT:  Nose: No mucosal edema.  Mouth/Throat: No oropharyngeal exudate or posterior oropharyngeal edema.  Eyes: Conjunctivae, EOM and lids are normal. Pupils are equal, round, and reactive to light.  Neck: No JVD present. Carotid bruit is not present. No edema present. No thyroid mass and no thyromegaly present.  Cardiovascular: S1 normal and S2 normal.  Exam reveals no gallop.   No murmur heard. Pulses:      Dorsalis pedis pulses are 1+ on the right side, and 1+ on the left side.  Respiratory: No respiratory distress. He has no wheezes. He has rhonchi in the right lower field and the left lower field. He has no rales.  GI: Soft. Bowel sounds are normal. There is no tenderness.  Musculoskeletal:       Right shoulder: He exhibits no swelling.  Lymphadenopathy:    He has no cervical adenopathy.  Neurological: He is alert.  Skin: Skin is warm. No rash noted. Nails show no clubbing.    Discharge Instructions    Current Discharge Medication List    START taking these medications   Details  acetaminophen (TYLENOL) 325 MG tablet Take 2 tablets (650 mg total) by mouth every 4 (four) hours as needed for mild pain or fever (Mild pain (1-3/10) or temp greater than 100.4). Qty: 1 tablet, Refills: 0    antiseptic  oral rinse (CPC / CETYLPYRIDINIUM CHLORIDE 0.05%) 0.05 % LIQD solution 7 mLs by Mouth Rinse route QID. Qty: 1 mL, Refills: 0    benzonatate (TESSALON) 100 MG capsule Take 1 capsule (100 mg total) by mouth every 6 (six) hours as needed for cough (For NONPRODUCTIVE cough). Qty: 20 capsule, Refills: 0    budesonide (PULMICORT) 0.5 MG/2ML nebulizer solution Take 2 mLs (0.5 mg total) by nebulization every 12 (twelve) hours. Qty: 1 mL, Refills: 12    calcium-vitamin D (OSCAL WITH D)  500-200 MG-UNIT per tablet Take 1 tablet by mouth 2 (two) times daily with a meal. Qty: 1 tablet, Refills: 0    chlorhexidine (PERIDEX) 0.12 % solution 15 mLs by Mouth Rinse route 2 (two) times daily. Qty: 120 mL, Refills: 0    docusate (COLACE) 50 MG/5ML liquid Take 10 mLs (100 mg total) by mouth every 12 (twelve) hours. Qty: 100 mL, Refills: 0    famotidine (PEPCID) 20 MG tablet Take 1 tablet (20 mg total) by mouth at bedtime. Qty: 1 tablet, Refills: 0    fentaNYL (SUBLIMAZE) 100 MCG/2ML injection Inject 2 mLs (100 mcg total) into the vein once. Qty: 2 mL, Refills: 0    fentaNYL 10 mcg/ml SOLN infusion Inject 30 mcg/hr into the vein continuous. Qty: 1 mL, Refills: 0    fluconazole (DIFLUCAN) 400-0.9 MG/200ML-% IVPB Inject 200 mLs (400 mg total) into the vein every other day. Qty: 200 mL, Refills: 0    heparin 1000 unit/mL SOLN injection 1 mL (1,000 Units total) by Dialysis route as needed (in dialysis). Qty: 1 mL, Refills: 0    heparin 5000 UNIT/ML injection Inject 1 mL (5,000 Units total) into the skin every 12 (twelve) hours. Qty: 1 mL, Refills: 0    ibuprofen (ADVIL,MOTRIN) 400 MG tablet Take 1 tablet (400 mg total) by mouth every 6 (six) hours as needed for fever (Give for fever unrelieved by Tylenol). Qty: 30 tablet, Refills: 0    ipratropium (ATROVENT) 0.06 % nasal spray Place 2 sprays into both nostrils 2 (two) times daily. Qty: 15 mL, Refills: 12    ipratropium-albuterol (DUONEB) 0.5-2.5 (3) MG/3ML SOLN Take 3 mLs by nebulization every 4 (four) hours. Qty: 360 mL, Refills: 0    lidocaine, PF, (XYLOCAINE) 1 % SOLN injection Inject 5 mLs into the skin as needed (topical anesthesia for hemodialysis ifGEBAUERS is ineffective.). Qty: 1 mL, Refills: 0    lidocaine-prilocaine (EMLA) cream Apply 1 application topically as needed (topical anesthesia for hemodialysis if Gebauers and Lidocaine injection are ineffective.). Qty: 30 g, Refills: 0    meropenem 500 mg in sodium  chloride 0.9 % 50 mL Inject 500 mg into the vein daily. Qty: 1 mL, Refills: 0    Midazolam HCl (MIDAZOLAM 50MG  IN NS 50ML, 1MG /ML, PREMIX INFUSION) Inject 1 mg/hr into the vein continuous. Qty: 1 mL, Refills: 0    multivitamin (RENA-VIT) TABS tablet Take 1 tablet by mouth at bedtime. Qty: 1 tablet, Refills: 0    norepinephrine 16 mg in dextrose 5 % 250 mL Inject 2 mcg/min into the vein continuous. Qty: 1 mL, Refills: 0    pentafluoroprop-tetrafluoroeth (GEBAUERS) AERO Apply 1 application topically as needed (topical anesthesia for hemodialysis). Qty: 1 mL, Refills: 0    scopolamine (TRANSDERM-SCOP) 1 MG/3DAYS Place 1 patch (1.5 mg total) onto the skin every 3 (three) days. Qty: 10 patch, Refills: 12    sennosides (SENOKOT) 8.8 MG/5ML syrup Take 10 mLs by mouth every 12 (twelve) hours. Qty: 240 mL,  Refills: 0    !! sodium chloride 0.9 % injection Inject 3-6 mLs into the vein every 10 (ten) minutes as needed. Qty: 5 mL, Refills: 0    !! sodium chloride 0.9 % injection 10-40 mLs by Intracatheter route every 12 (twelve) hours. Qty: 5 mL, Refills: 0    vancomycin 1,750 mg in sodium chloride 0.9 % 500 mL Inject 1,750 mg into the vein every Monday, Wednesday, and Friday with hemodialysis. Qty: 1 mL, Refills: 0    vecuronium (NORCURON) 10 MG injection Inject 10 mLs (10 mg total) into the vein once. Qty: 1 each, Refills: 0     !! - Potential duplicate medications found. Please discuss with provider.    CONTINUE these medications which have CHANGED   Details  calcium acetate (PHOSLO) 667 MG capsule Take 3 capsules (2,001 mg total) by mouth 3 (three) times daily with meals. Qty: 1 capsule, Refills: 0      STOP taking these medications     docusate sodium (COLACE) 100 MG capsule      gabapentin (NEURONTIN) 300 MG capsule      HYDROcodone-acetaminophen (NORCO/VICODIN) 5-325 MG per tablet      omeprazole (PRILOSEC) 40 MG capsule      oxycodone (OXY-IR) 5 MG capsule         Allergies  Allergen Reactions  . Ivp Dye [Iodinated Diagnostic Agents] Other (See Comments)    Reaction unknown  . Omnipaque [Iohexol] Other (See Comments)    Reaction unknown  . Minoxidil Other (See Comments)    Reaction unknown  . Morphine And Related Other (See Comments)    Reaction unknown   Follow-up Information    Follow up with doctor at [facility] In 1 day.       The results of significant diagnostics from this hospitalization (including imaging, microbiology, ancillary and laboratory) are listed below for reference.    Significant Diagnostic Studies: Dg Abd 1 View  08/25/2014   CLINICAL DATA:  Orogastric tube placement  EXAM: ABDOMEN - 1 VIEW  COMPARISON:  Aug 23, 2014  FINDINGS: Orogastric tube tip and side port are in the distal stomach. Bowel gas pattern unremarkable. No obstruction or free air seen on this supine examination.  IMPRESSION: Orogastric tube tip and side-port in distal stomach.   Electronically Signed   By: Lowella Grip III M.D.   On: 08/25/2014 15:19   Dg Abd 1 View  08/23/2014   CLINICAL DATA:  Abdominal distention  EXAM: ABDOMEN - 1 VIEW  COMPARISON:  August 18, 2014  FINDINGS: There is air in the stomach. There is a paucity of gas in the small and large bowel. No free air is seen. There are vascular calcifications in the pelvis.  IMPRESSION: Paucity of small bowel and large bowel gas. This finding may be seen normally. However, it may also be seen as a consequence of enteritis or early ileus. Obstruction not felt to be likely. No apparent free air.   Electronically Signed   By: Lowella Grip III M.D.   On: 08/23/2014 13:07   Dg Abd 1 View  08/18/2014   CLINICAL DATA:  Verify orogastric tube placement  EXAM: ABDOMEN - 1 VIEW  COMPARISON:  08/13/2014  FINDINGS: The orogastric tube tip is at the gastric fundus. The bowel gas pattern is nonobstructive where visualized.  IMPRESSION: Orogastric tube with tip at the gastric fundus.   Electronically Signed   By:  Monte Fantasia M.D.   On: 08/18/2014 08:31   Dg Abd  1 View  08/13/2014   CLINICAL DATA:  OG placement  EXAM: ABDOMEN - 1 VIEW  COMPARISON:  Abdomen films of 08/13/2014  FINDINGS: The tip of the OG tube overlies the expected body of the stomach. The bowel gas pattern is nonspecific. Probable atelectasis is noted at the left lung base.  IMPRESSION: Tip of OG tube overlies the region of the body of the stomach.   Electronically Signed   By: Ivar Drape M.D.   On: 08/13/2014 08:13   Dg Abd 1 View  08/13/2014   CLINICAL DATA:  Orogastric tube placement.  EXAM: ABDOMEN - 1 VIEW  COMPARISON:  Abdominal CT from 3 days ago  FINDINGS: The orogastric tube is coiled in the gastric antrum.  The visualized bowel gas pattern is nonobstructive.  IMPRESSION: The orogastric tube is coiled in the distal stomach.   Electronically Signed   By: Monte Fantasia M.D.   On: 08/13/2014 01:27   US Venous Img Lower Bilateral  08/22/2014   CLINICAL DATA:  Persistent fevers, anticoagulation therapy.  EXAM: BILATERAL LOWER EXTREMITY VENOUS DOPPLER ULTRASOUND  TECHNIQUE: Gray-scale sonography with compression, as well as color and duplex ultrasound, were performed to evaluate the deep venous system from the level of the common femoral vein through the popliteal and proximal calf veins.  COMPARISON:  None  FINDINGS: Normal compressibility of the common femoral, superficial femoral, and popliteal veins, as well as the proximal calf veins. No filling defects to suggest DVT on grayscale or color Doppler imaging. Doppler waveforms show normal direction of venous flow, normal respiratory phasicity and response to augmentation.  IMPRESSION: 1. No evidence of lower extremity deep vein thrombosis, bilaterally.   Electronically Signed   By: Lucrezia Europe M.D.   On: 08/22/2014 13:38   Dg Chest Port 1 View  08/25/2014   CLINICAL DATA:  Respiratory failure with end-stage renal disease.  EXAM: PORTABLE CHEST - 1 VIEW  COMPARISON:  08/21/2014.   FINDINGS: Endotracheal tube has been advanced since the prior radiograph now lying 2.6 cm above carina. Cardiomegaly. BILATERAL extensive pulmonary opacities consistent with pneumonia or ARDS appear worse compared with 4/29.  Central venous catheter tip remains somewhat low tip in the RIGHT atrium near the tricuspid valve. This could be pulled back 7- 8 cm to lie at the cavoatrial junction. No pneumothorax.  IMPRESSION: Worsening aeration. BILATERAL extensive pulmonary opacities consistent with pneumonia or ARDS. ETT 2.6 cm above carina. Central venous line tip somewhat low.   Electronically Signed   By: Rolla Flatten M.D.   On: 08/25/2014 15:23   Dg Chest Port 1 View  08/21/2014   CLINICAL DATA:  Pneumonia, sepsis.  EXAM: PORTABLE CHEST - 1 VIEW  COMPARISON:  August 18, 2014.  FINDINGS: Stable cardiomediastinal silhouette. Endotracheal tube is in grossly good position with distal tip 6 cm above the carina. Nasogastric tube is seen with tip in proximal stomach. Right internal jugular catheter line is again noted with tip extending into right atrium. Hypoinflation of the lungs is noted. No pneumothorax or pleural effusion is noted. Decreased right basilar opacity is noted consistent with improving pneumonia, although residual density remains. Stable left lung diffuse densities are noted concerning for edema or pneumonia.  IMPRESSION: Endotracheal and nasogastric tubes are in grossly good position.  Stable left lung opacity is noted concerning for pneumonia or edema. Decreased right basilar opacity is noted consistent with improving pneumonia or atelectasis.  Distal tip of right internal jugular catheter line remains within the right atrium.  It is recommended to be withdrawn at least 2-3 cm. These results will be called to the ordering clinician or representative by the Radiologist Assistant, and communication documented in the PACS or zVision Dashboard.   Electronically Signed   By: Marijo Conception, M.D.   On:  08/21/2014 09:25   Dg Chest Port 1 View  08/18/2014   CLINICAL DATA:  Endotracheal tube repositioning.  Initial encounter.  EXAM: PORTABLE CHEST - 1 VIEW  COMPARISON:  Chest radiograph performed 08/17/2014  FINDINGS: The patient's endotracheal tube is seen ending 5 cm above the carina.  Worsening right basilar airspace opacity is noted, with persistent bilateral central airspace opacity, concerning for multifocal pneumonia. Asymmetric interstitial edema is considered less likely, given the somewhat peripheral right-sided opacity. No pleural effusion or pneumothorax is seen.  The cardiomediastinal silhouette remains normal in size. No acute osseous abnormalities are identified. A right IJ line is noted extending into the right atrium.  IMPRESSION: 1. Endotracheal tube seen ending 5 cm above the carina. 2. Worsening right basilar airspace opacity, with persistent bilateral central airspace opacities, concerning for multifocal pneumonia. Asymmetric interstitial edema is considered less likely, given the peripheral appearance of the right-sided airspace opacity.   Electronically Signed   By: Garald Balding M.D.   On: 08/18/2014 06:30   Dg Chest Port 1 View  08/17/2014   CLINICAL DATA:  End-stage renal disease. Shortness of breath. Fever.  EXAM: PORTABLE CHEST - 1 VIEW  COMPARISON:  Single view of the chest 08/14/2014 and 08/12/2014.  FINDINGS: Support tubes and lines are unchanged. Bilateral airspace disease persists without marked change since the most recent examination. No pneumothorax or pleural effusion is seen. Heart size is normal.  IMPRESSION: No marked change in bilateral airspace disease.   Electronically Signed   By: Inge Rise M.D.   On: 08/17/2014 11:03   Dg Chest Port 1 View  08/14/2014   CLINICAL DATA:  Read transferred to ICU was shortness of breath and respiratory failure.  EXAM: PORTABLE CHEST - 1 VIEW  COMPARISON:  08/12/2014  FINDINGS: Cardiac shadow is mildly enlarged. An endotracheal  tube is noted approximately 7 cm above the carina. A nasogastric catheter extends into the stomach. Bilateral airspace opacities are again identified and relatively stable given the poor inspiratory effort. No pneumothorax or sizable effusion is seen.  IMPRESSION: Bilateral airspace opacities stable from the prior exam.  Tubes and lines as described.   Electronically Signed   By: Inez Catalina M.D.   On: 08/14/2014 09:14   Dg Chest Port 1 View  08/13/2014   CLINICAL DATA:  Intubation  EXAM: PORTABLE CHEST - 1 VIEW  COMPARISON:  Chest x-ray from earlier the same day  FINDINGS: New endotracheal tube with tip just above the clavicular heads. The right IJ central line remains in stable position, tip at the upper right atrium.  Normal heart size. Increasing bilateral airspace opacity which is symmetric. Lung volumes are low. No no pneumothorax.  Left brachial and axillary stenting.  IMPRESSION: 1. The new endotracheal tube is in good position. 2. Progressive bilateral airspace disease which could reflect noncardiogenic edema or pneumonia.   Electronically Signed   By: Monte Fantasia M.D.   On: 08/13/2014 00:22   Dg Chest Port 1 View  08/12/2014   CLINICAL DATA:  Decreased oxygen saturation.  EXAM: PORTABLE CHEST - 1 VIEW  COMPARISON:  08/10/2014  FINDINGS: Right IJ central venous catheter tip projects over the right atrium. Bilateral airspace opacities. Small  right effusion not excluded. No pneumothorax. Heart size upper normal. Central vascular congestion. Limited osseous assessment due to underpenetration and portable technique.  IMPRESSION: Increased bilateral airspace opacities may reflect multifocal pneumonia and/or pulmonary edema.   Electronically Signed   By: Carlos Levering M.D.   On: 08/12/2014 23:24   US Doppler Up Extr  Bilateral ([facility] Hx)  08/22/2014   CLINICAL DATA:  Upper extremity edema  EXAM: BILATERAL UPPER EXTREMITY VENOUS DUPLEX ULTRASOUND  TECHNIQUE: Gray-scale sonography with graded  compression, as well as color Doppler and duplex ultrasound were performed to evaluate the bilateral upper extremity deep venous systems from the level of the subclavian vein and including the jugular, axillary, basilic, radial, ulnar and upper cephalic vein. Spectral Doppler was utilized to evaluate flow at rest and with distal augmentation maneuvers.  COMPARISON:  None.  FINDINGS: RIGHT UPPER EXTREMITY  Internal Jugular Vein: No evidence of thrombus. Normal compressibility, respiratory phasicity and response to augmentation.  Subclavian Vein: No evidence of thrombus. Normal compressibility, respiratory phasicity and response to augmentation.  Axillary Vein: No evidence of thrombus. Normal compressibility, respiratory phasicity and response to augmentation.  Cephalic Vein: No evidence of thrombus. Normal compressibility, respiratory phasicity and response to augmentation.  Basilic Vein: No evidence of thrombus. Normal compressibility, respiratory phasicity and response to augmentation.  Brachial Veins: No evidence of thrombus. Normal compressibility, respiratory phasicity and response to augmentation.  Radial Veins: No evidence of thrombus. Normal compressibility, respiratory phasicity and response to augmentation.  Ulnar Veins: No evidence of thrombus. Normal compressibility, respiratory phasicity and response to augmentation.  Venous Reflux:  None.  Other Findings:  None.  LEFT UPPER EXTREMITY  Internal Jugular Vein: No evidence of thrombus. Normal compressibility, respiratory phasicity and response to augmentation.  Subclavian Vein: No evidence of thrombus. Normal compressibility, respiratory phasicity and response to augmentation.  Axillary Vein: No evidence of thrombus. Normal compressibility, respiratory phasicity and response to augmentation.  Cephalic Vein: No evidence of thrombus. Normal compressibility, respiratory phasicity and response to augmentation.  Basilic Vein: No evidence of thrombus. Normal  compressibility, respiratory phasicity and response to augmentation.  Brachial Veins: No evidence of thrombus. Normal compressibility, respiratory phasicity and response to augmentation.  Radial Veins: No evidence of thrombus. Normal compressibility, respiratory phasicity and response to augmentation.  Ulnar Veins: No evidence of thrombus. Normal compressibility, respiratory phasicity and response to augmentation.  Venous Reflux:  None.  Other Findings: There is evidence of a thrombosed dialysis fistula in the left upper arm region. By report, this fistula is chronic and no longer used clinically.  IMPRESSION: No evidence of deep venous thrombosis in either upper extremity. Thrombosed left upper extremity fistula which by report has not been used for many years.   Electronically Signed   By: Lowella Grip III M.D.   On: 08/22/2014 14:03   Nm  In - 111 Wbc Wb 72 Hr  6 Of ([facility] Hx)  08/13/2014   CLINICAL DATA:  Fever.  EXAM: NUCLEAR MEDICINE LEUKOCYTE SCAN  TECHNIQUE: Following intravenous administration of radiolabeled white blood cells, images of the head, neck, trunk, and extremities were obtained on subsequent days.  RADIOPHARMACEUTICALS:  0.339 mCi In-111 labeled autologous leukocytes.  COMPARISON:  CT chest from 08/08/2014 and CT abdomen pelvis from 08/10/2014.  FINDINGS: Diffuse radiotracer activity is identified throughout both lungs corresponding to the progressive bilateral airspace disease seen on recent chest radiograph. There is normal physiologic tracer activity within the bone marrow, liver and spleen. No extra thoracic foci of increased uptake identified.  IMPRESSION: 1. Diffuse increased radiotracer uptake identified throughout both lungs corresponding to progressive bilateral airspace opacities. Findings may reflect bilateral pneumonias.   Electronically Signed   By: Kerby Moors M.D.   On: 08/13/2014 15:38   Ct Lung Screening  08/21/2014   CLINICAL DATA:  Fever and dry cough for 2 weeks.  Increasing body aches with pleuritic chest pain. Evaluate for pneumonia or empyema. contrast allergy. Initial encounter.  EXAM: CT CHEST WITHOUT CONTRAST  TECHNIQUE: Multidetector CT imaging of the chest was performed following the standard protocol without IV contrast.  COMPARISON:  None.  FINDINGS: Mediastinum/Nodes: No enlarged mediastinal, hilar or axillary lymph nodes identified. Hilar assessment is limited by the lack of intravenous contrast and adjacent airspace opacities.The patient is intubated. There is a right IJ central venous catheter with its tip in the upper right atrium. Nasogastric tube extends into the stomach. The heart size is normal. There is no pericardial effusion.Dense coronary artery calcifications again noted. Left subclavian vascular stent noted.  Lungs/Pleura: As demonstrated on the recent radiographs, there is extensive bilateral airspace disease which is new compared with the CT of 2 weeks ago. There confluent components in both lung bases with associated air bronchograms. There is minimal if any dependent pleural fluid bilaterally and no evidence of empyema. Underlying parenchymal calcifications are present in both lungs.  Upper abdomen:  Unremarkable.  There is no adrenal mass.  Musculoskeletal/Chest wall: No chest wall mass or acute osseous findings demonstrated. There is mild generalized soft tissue edema. The bones appear somewhat sclerotic, likely related to renal osteodystrophy.  IMPRESSION: 1. Extensive bilateral airspace opacities consistent with pneumonia or ARDS. 2. Minimal if any pleural fluid.  No evidence of empyema. 3. Underlying parenchymal calcifications throughout both lungs, likely due to previous granulomatous disease. 4. Renal osteodystrophy.   Electronically Signed   By: Richardean Sale M.D.   On: 08/21/2014 17:36   Dg Fluor Guide Ndl Place/inj/lp  08/11/2014   CLINICAL DATA:  Fever of unknown origin, sepsis  EXAM: DIAGNOSTIC LUMBAR PUNCTURE UNDER FLUOROSCOPIC  GUIDANCE  FLUOROSCOPY TIME:  Fluoroscopy Time (in minutes and seconds): 0 minutes, 54 seconds  Number of Acquired Images:  1  PROCEDURE: Informed consent was obtained from the patient prior to the procedure, including potential complications of headache, allergy, and pain. With the patient prone, the lower back was prepped with Betadine. 1% Lidocaine was used for local anesthesia. Lumbar puncture was attempted at the L3-4 level. However, the patient severe discomfort with penetration of the skin by only 2-3 cm despite having received 5 cc of 1% lidocaine at the puncture site. The patient was unable to control his jerking and the procedure was terminated. The patient did not wish to proceed further.  IMPRESSION: Unsuccessful limited attempt at lumbar puncture.   Electronically Signed   By: David  Martinique   On: 08/11/2014 15:21    Microbiology: Recent Results (from the past 240 hour(s))  Culture, expectorated sputum-assessment     Status: None   Collection Time: 08/21/14 12:36 PM  Result Value Ref Range Status   Micro Text Report   Final       SOURCE: ETS    COMMENT                   APPEARS TO BE NORMAL FLORA AT 48 HOURS   GRAM STAIN                FEW WHITE BLOOD CELLS RARE GRAM NEGATIVE DIPLOCOCCI   GRAM  STAIN                RARE GRAM POSITIVE COCCI   GRAM STAIN                GOOD SPECIMEN-80-90% WBC   GRAM STAIN                RARE GRAM NEGATIVE COCCO-BACILLI   ANTIBIOTIC                                                      Culture, blood (single)     Status: None (Preliminary result)   Collection Time: 08/21/14  3:31 PM  Result Value Ref Range Status   Micro Text Report   Preliminary       COMMENT                   NO GROWTH IN 36 HOURS   ANTIBIOTIC                                                      Culture, blood (single)     Status: None (Preliminary result)   Collection Time: 08/21/14  5:31 PM  Result Value Ref Range Status   Micro Text Report   Preliminary       COMMENT                    NO GROWTH IN 36 HOURS   ANTIBIOTIC                                                         Labs: Basic Metabolic Panel:  Recent Labs Lab 08/20/14 0417 08/22/14 0653 08/23/14 0725 08/24/14 1500  NA 136 136 135 136  K 5.0 5.1 5.2* 5.9*  CL 94* 95* 94* 95*  CO2 28 27 27 23   GLUCOSE 161* 176* 149* 94  BUN 64* 71* 81* 97*  CREATININE 8.67* 8.87* 10.52* 12.78*  CALCIUM 6.9* 7.1* 6.7* 6.6*  MG  --   --  2.6*  --   PHOS  --   --  7.1* 9.3*   Liver Function Tests:  Recent Labs Lab 08/23/14 0725 08/24/14 1500  AST 68*  --   ALT 58  --   ALKPHOS 79  --   BILITOT 0.7  --   PROT 6.4*  --   ALBUMIN 2.0* 2.0*   CBC:  Recent Labs Lab 08/20/14 0417 08/22/14 0653 08/23/14 0725 08/24/14 0430 08/24/14 1500 08/24/14 2000 08/25/14 0501  WBC 8.9 8.4 10.5  --  7.3  --   --   NEUTROABS 7.4* 5.7 7.5*  --   --   --   --   HGB 8.1* 7.7* 7.0* 6.1* 6.6* 8.0* 7.7*  HCT 24.0* 23.9* 21.7*  --  20.4* 24.6*  --   MCV 91 92 91.0  --  90.9  --   --   PLT 245 195 218  --  187  --   --  SignedLoletha Grayer  08/26/2014, 1:16 PM

## 2014-08-26 NOTE — Care Management (Signed)
Patient's mother LTAC preference is [facility].  Notified Tommy w [facility].  Patient is going to require hemodialysis today prior to  Transfer.  At present are aiming for 5:30p transfer by [facility].  Have provided primary nurse Tillie Rung with accepting MD and room number.  Konrad Dolores has notified [facility].

## 2014-08-26 NOTE — Progress Notes (Signed)
[facility]  ROUNDING NOTE   Subjective:   Reintubated yesterday Last hemodialysis was Monday Mother at bedside On norepinephrine 60mcg gtt  Objective:  Vital signs in last 24 hours:  Temp:  [98 F (36.7 C)-98.6 F (37 C)] 98 F (36.7 C) (05/04 0700) Pulse Rate:  [84-111] 84 (05/04 0700) Resp:  [13-33] 20 (05/04 0700) BP: (76-147)/(50-83) 107/68 mmHg (05/04 0700) SpO2:  [85 %-100 %] 100 % (05/04 0749) FiO2 (%):  [50 %-80 %] 50 % (05/04 0800) Weight:  [132.6 kg (292 lb 5.3 oz)] 132.6 kg (292 lb 5.3 oz) (05/04 0500)  Weight change: 1.1 kg (2 lb 6.8 oz) Filed Weights   08/24/14 1845 08/25/14 0500 08/26/14 0500  Weight: 131.5 kg (289 lb 14.5 oz) 133.2 kg (293 lb 10.4 oz) 132.6 kg (292 lb 5.3 oz)    Intake/Output: I/O last 3 completed shifts: In: 808.3 [P.O.:360; I.V.:388.3; NG/GT:60] Out: -    Intake/Output this shift:  Total I/O In: 5.8 [I.V.:5.8] Out: -   General: critically ill, intubated, sedated Head: +ETT, eyes closed.  Neck: trachea midline CVS: tachycardia Resp: bilateral wheezes, rhonchi, vent PRVC FiO2 50% ABD: mild distension EXT: +peripheral edema Neuro: intubated, sedated Access: Access: right arm AVG with fistular component.    Basic Metabolic Panel:  Recent Labs Lab 08/20/14 0417 08/22/14 0653 08/23/14 0725 08/24/14 1500  NA 136 136 135 136  K 5.0 5.1 5.2* 5.9*  CL 94* 95* 94* 95*  CO2 28 27 27 23   GLUCOSE 161* 176* 149* 94  BUN 64* 71* 81* 97*  CREATININE 8.67* 8.87* 10.52* 12.78*  CALCIUM 6.9* 7.1* 6.7* 6.6*  MG  --   --  2.6*  --   PHOS  --   --  7.1* 9.3*    Liver Function Tests:  Recent Labs Lab 08/23/14 0725 08/24/14 1500  AST 68*  --   ALT 58  --   ALKPHOS 79  --   BILITOT 0.7  --   PROT 6.4*  --   ALBUMIN 2.0* 2.0*   No results for input(s): LIPASE, AMYLASE in the last 168 hours. No results for input(s): AMMONIA in the last 168 hours.  CBC:  Recent Labs Lab 08/20/14 0417 08/22/14 0653  08/23/14 0725 08/24/14 0430 08/24/14 1500 08/24/14 2000 08/25/14 0501  WBC 8.9 8.4 10.5  --  7.3  --   --   NEUTROABS 7.4* 5.7 7.5*  --   --   --   --   HGB 8.1* 7.7* 7.0* 6.1* 6.6* 8.0* 7.7*  HCT 24.0* 23.9* 21.7*  --  20.4* 24.6*  --   MCV 91 92 91.0  --  90.9  --   --   PLT 245 195 218  --  187  --   --     Cardiac Enzymes: No results for input(s): CKTOTAL, CKMB, CKMBINDEX, TROPONINI in the last 168 hours.  BNP: Invalid input(s): POCBNP  CBG:  Recent Labs Lab 08/24/14 0729 08/26/14 0749  GLUCAP 94 93    Microbiology: Results for orders placed or performed during the hospital encounter of 08/05/14  Influenza A&B Antigens [facility])     Status: None   Collection Time: 08/05/14  5:49 PM  Result Value Ref Range Status   Micro Text Report   Final       COMMENT                   NEGATIVE FOR INFLUENZA A (ANTIGEN ABSENT)   COMMENT  NEGATIVE FOR INFLUENZA B (ANTIGEN ABSENT)   ANTIBIOTIC                                                      Culture, blood (single)     Status: None   Collection Time: 08/05/14  6:26 PM  Result Value Ref Range Status   Micro Text Report   Final       COMMENT                   NO GROWTH AEROBICALLY/ANAEROBICALLY IN 5 DAYS   ANTIBIOTIC                                                      Culture, blood (single)     Status: None   Collection Time: 08/05/14  6:41 PM  Result Value Ref Range Status   Micro Text Report   Final       COMMENT                   NO GROWTH AEROBICALLY/ANAEROBICALLY IN 5 DAYS   ANTIBIOTIC                                                      Culture, blood (single)     Status: None   Collection Time: 08/05/14 10:15 PM  Result Value Ref Range Status   Micro Text Report   Final       COMMENT                   NO GROWTH AEROBICALLY/ANAEROBICALLY IN 5 DAYS   ANTIBIOTIC                                                      Culture, blood (single)     Status: None   Collection Time: 08/07/14  10:30 AM  Result Value Ref Range Status   Micro Text Report   Final       COMMENT                   NO GROWTH AEROBICALLY/ANAEROBICALLY IN 5 DAYS   ANTIBIOTIC                                                      Culture, blood (single)     Status: None   Collection Time: 08/07/14 10:43 AM  Result Value Ref Range Status   Micro Text Report   Final       COMMENT                   NO GROWTH AEROBICALLY/ANAEROBICALLY IN 5 DAYS   ANTIBIOTIC  Culture, blood (single)     Status: None   Collection Time: 08/10/14  6:43 PM  Result Value Ref Range Status   Micro Text Report   Final       COMMENT                   NO GROWTH AEROBICALLY/ANAEROBICALLY IN 5 DAYS   ANTIBIOTIC                                                      Culture, blood (single)     Status: None   Collection Time: 08/10/14  6:43 PM  Result Value Ref Range Status   Micro Text Report   Final       COMMENT                   NO GROWTH AEROBICALLY/ANAEROBICALLY IN 5 DAYS   ANTIBIOTIC                                                      Culture, blood (single)     Status: None   Collection Time: 08/13/14 12:40 AM  Result Value Ref Range Status   Micro Text Report   Final       COMMENT                   NO GROWTH AEROBICALLY/ANAEROBICALLY IN 5 DAYS   ANTIBIOTIC                                                      Culture, blood (single)     Status: None   Collection Time: 08/13/14 12:50 AM  Result Value Ref Range Status   Micro Text Report   Final       COMMENT                   NO GROWTH AEROBICALLY/ANAEROBICALLY IN 5 DAYS   ANTIBIOTIC                                                      Culture, fungus without smear     Status: None (Preliminary result)   Collection Time: 08/13/14  8:40 AM  Result Value Ref Range Status   Micro Text Report   Preliminary       SOURCE: BRONCH WASHING    ORGANISM 1                Candida albicans   COMMENT                    -   COMMENT                   -   ANTIBIOTIC  ORG#1                                               Bronchial Wash Culture     Status: None   Collection Time: 08/13/14  8:40 AM  Result Value Ref Range Status   Micro Text Report   Final       ORGANISM 1                LIGHT GROWTH Candida albicans   COMMENT                   -   ANTIBIOTIC                    ORG#1                                               Influenza A,B,H1N1 - PCR [facility])     Status: None   Collection Time: 08/15/14  2:20 PM  Result Value Ref Range Status   Influenza A By PCR NEGATIVE NEGATIVE Final   Influenza B By PCR NEGATIVE NEGATIVE Final   H1N1 flu by pcr NOT DETECTED NOT-DETECTED Final    Comment:                  ----------------------- The Xpert Flu assay (FDA approval for nasal aspirates or washes and nasopharyngeal swab specimens), is intended as an aid in the diagnosis of influenza and should not be used as a sole basis for treatment.   Culture, expectorated sputum-assessment     Status: None   Collection Time: 08/21/14 12:36 PM  Result Value Ref Range Status   Micro Text Report   Final       SOURCE: ETS    COMMENT                   APPEARS TO BE NORMAL FLORA AT 48 HOURS   GRAM STAIN                FEW WHITE BLOOD CELLS RARE GRAM NEGATIVE DIPLOCOCCI   GRAM STAIN                RARE GRAM POSITIVE COCCI   GRAM STAIN                GOOD SPECIMEN-80-90% WBC   GRAM STAIN                RARE GRAM NEGATIVE COCCO-BACILLI   ANTIBIOTIC                                                      Culture, blood (single)     Status: None (Preliminary result)   Collection Time: 08/21/14  3:31 PM  Result Value Ref Range Status   Micro Text Report   Preliminary       COMMENT                   NO GROWTH IN 36 HOURS   ANTIBIOTIC  Culture, blood (single)     Status: None (Preliminary result)   Collection Time: 08/21/14  5:31 PM   Result Value Ref Range Status   Micro Text Report   Preliminary       COMMENT                   NO GROWTH IN 36 HOURS   ANTIBIOTIC                                                        Coagulation Studies: No results for input(s): LABPROT, INR in the last 72 hours.  Urinalysis: No results for input(s): COLORURINE, LABSPEC, PHURINE, GLUCOSEU, HGBUR, BILIRUBINUR, KETONESUR, PROTEINUR, UROBILINOGEN, NITRITE, LEUKOCYTESUR in the last 72 hours.  Invalid input(s): APPERANCEUR    Imaging: Dg Abd 1 View  08/25/2014   CLINICAL DATA:  Orogastric tube placement  EXAM: ABDOMEN - 1 VIEW  COMPARISON:  Aug 23, 2014  FINDINGS: Orogastric tube tip and side port are in the distal stomach. Bowel gas pattern unremarkable. No obstruction or free air seen on this supine examination.  IMPRESSION: Orogastric tube tip and side-port in distal stomach.   Electronically Signed   By: Lowella Grip III M.D.   On: 08/25/2014 15:19   Dg Chest Port 1 View  08/25/2014   CLINICAL DATA:  Respiratory failure with end-stage renal disease.  EXAM: PORTABLE CHEST - 1 VIEW  COMPARISON:  08/21/2014.  FINDINGS: Endotracheal tube has been advanced since the prior radiograph now lying 2.6 cm above carina. Cardiomegaly. BILATERAL extensive pulmonary opacities consistent with pneumonia or ARDS appear worse compared with 4/29.  Central venous catheter tip remains somewhat low tip in the RIGHT atrium near the tricuspid valve. This could be pulled back 7- 8 cm to lie at the cavoatrial junction. No pneumothorax.  IMPRESSION: Worsening aeration. BILATERAL extensive pulmonary opacities consistent with pneumonia or ARDS. ETT 2.6 cm above carina. Central venous line tip somewhat low.   Electronically Signed   By: Rolla Flatten M.D.   On: 08/25/2014 15:23     Medications:   . fentaNYL infusion INTRAVENOUS 10 mcg/hr (08/26/14 0744)  . midazolam (VERSED) 50mg  in NS 59mL (1mg /ml) premix infusion 2 mg/hr (08/25/14 2313)  . norepinephrine  (LEVOPHED) Adult infusion 4 mcg/min (08/26/14 0300)   . antiseptic oral rinse  7 mL Mouth Rinse QID  . budesonide (PULMICORT) nebulizer solution  0.5 mg Nebulization Q12H  . calcium acetate  2,001 mg Oral TID WC  . calcium-vitamin D  1 tablet Oral BID WC  . chlorhexidine  15 mL Mouth Rinse BID  . docusate  100 mg Oral Q12H  . famotidine  20 mg Oral QHS  . fentaNYL (SUBLIMAZE) injection  100 mcg Intravenous Once  . fluconazole (DIFLUCAN) IV  400 mg Intravenous Q48H  . heparin subcutaneous  5,000 Units Subcutaneous Q12H  . ipratropium  2 spray Each Nare BID  . ipratropium-albuterol  3 mL Nebulization Q4H  . meropenem (MERREM) IV  500 mg Intravenous Q24H  . multivitamin  1 tablet Oral QHS  . scopolamine  1 patch Transdermal Q72H  . sennosides  10 mL Oral Q12H  . sodium chloride  5 mL Intravenous Daily  . vancomycin  1,750 mg Intravenous Q M,W,F-HD  . vecuronium  10 mg Intravenous Once   sodium chloride,  sodium chloride, acetaminophen **OR** acetaminophen, benzonatate, heparin, ibuprofen, lidocaine (PF), lidocaine-prilocaine, pentafluoroprop-tetrafluoroeth, sodium chloride, sodium chloride  Assessment/ Plan:  38 y.o. Black male with End Stage Renal Disease on MWF schedule with right arm AVF/AVG followed by [facility] [address], hypertension, hyperlipidemia, Guillain-Barre syndrome, admission for fever 08/05/14  1. ESRD: MWF HD: N18.6 [place]. [facility]. - Tolerated hemodialysis on Monday. UF of 0. Plan for next treatment later today.  - Will plan on 2 -3 litres of infiltration as hemodynamically stable. Most likely will need another treatment for tomorrow.  - Continue to monitor daily for dialysis need.   2. Acute respiratory failure with pneumonia: J96.00, J15.9. Afebrile. Extubated on Monday but then reintubated yesterday.  - Continue supportive care - Continue abx therapy per ID. Afebrile. Meropenem and vancomycin  3. Anemia of Chronic Kidney Disease: D63.1   Hemoglobin improved, status post PRBC transfusion on 5/2.  Gets epo with hemodialysis treatment and micera as outpatient.  - EPO with treatment today  5. Secondary Hyperparathyroidism:N25.81phos 9.3 - restart binders when taking PO. Calcium acetate 3 tabs with meals.    LOS: Clare, Merlinda Wrubel 5/4/20169:06 AM

## 2014-08-26 NOTE — Progress Notes (Signed)
PULMONARY / CRITICAL CARE MEDICINE   Name: Patrick Downs MRN: [id number] DOB: Aug 27, 1975    ADMISSION DATE:  08/05/2014   SUBJECTIVE:  patient with  hypoxic resp failure, patient with progressive b/l infiltrates on CXR, patient reintubated yesterday, patient with increased WOB. fio2 50%, patient emergently re-intubated. critically ill,  will place on full vent support, PEEP at 10, plan for [facility] Referral  VITAL SIGNS: Temp:  [98 F (36.7 C)-98.6 F (37 C)] 98 F (36.7 C) (05/04 0700) Pulse Rate:  [84-111] 84 (05/04 0700) Resp:  [13-33] 20 (05/04 0700) BP: (76-147)/(50-83) 107/68 mmHg (05/04 0700) SpO2:  [85 %-100 %] 100 % (05/04 0749) FiO2 (%):  [50 %-80 %] 50 % (05/04 0800) Weight:  [292 lb 5.3 oz (132.6 kg)] 292 lb 5.3 oz (132.6 kg) (05/04 0500) HEMODYNAMICS:   VENTILATOR SETTINGS: Vent Mode:  [-] PRVC FiO2 (%):  [50 %-80 %] 50 % Set Rate:  [20 bmp] 20 bmp Vt Set:  [550 mL] 550 mL PEEP:  [10 cmH20] 10 cmH20 INTAKE / OUTPUT:  Intake/Output Summary (Last 24 hours) at 08/26/14 0903 Last data filed at 08/26/14 0744  Gross per 24 hour  Intake 397.81 ml  Output      0 ml  Net 397.81 ml    PHYSICAL EXAMINATION:  GEN disheveled, critically ill appearing   HEENT pale conjunctivae, PERRL, orally intubated   NECK supple  No masses   CARD regular rate  no murmur  no thrills  No LE edema  tachy   VASCULAR ACCESS AV graft present   ABD hypoactive BS   LYMPH negative neck, negative axillae   EXTR negative edema   SKIN No rashes, skin turgor good   NEURO negative Babinski R/L, follows commands   PSYCH sedated   RESP +use of accessory muscles, now on vent    ROS: unable to provide  CBC  Recent Labs Lab 08/22/14 0653 08/23/14 0725  08/24/14 1500 08/24/14 2000 08/25/14 0501  WBC 8.4 10.5  --  7.3  --   --   HGB 7.7* 7.0*  < > 6.6* 8.0* 7.7*  HCT 23.9* 21.7*  --  20.4* 24.6*  --   PLT 195 218  --  187  --   --   < > = values in this interval not  displayed. Coag's No results for input(s): APTT, INR in the last 168 hours. BMET  Recent Labs Lab 08/22/14 0653 08/23/14 0725 08/24/14 1500  NA 136 135 136  K 5.1 5.2* 5.9*  CL 95* 94* 95*  CO2 27 27 23   BUN 71* 81* 97*  CREATININE 8.87* 10.52* 12.78*  GLUCOSE 176* 149* 94   Electrolytes  Recent Labs Lab 08/22/14 0653 08/23/14 0725 08/24/14 1500  CALCIUM 7.1* 6.7* 6.6*  MG  --  2.6*  --   PHOS  --  7.1* 9.3*   Sepsis Markers No results for input(s): LATICACIDVEN, PROCALCITON, O2SATVEN in the last 168 hours. ABG  Recent Labs Lab 08/24/14 1135  PHART 7.39  PCO2ART 43  PO2ART 50*   Liver Enzymes  Recent Labs Lab 08/23/14 0725 08/24/14 1500  AST 68*  --   ALT 58  --   ALKPHOS 79  --   BILITOT 0.7  --   ALBUMIN 2.0* 2.0*   Cardiac Enzymes No results for input(s): TROPONINI, PROBNP in the last 168 hours. Glucose  Recent Labs Lab 08/24/14 0729 08/26/14 0749  GLUCAP 94 93    Imaging Dg Abd 1 View  08/25/2014   CLINICAL DATA:  Orogastric tube placement  EXAM: ABDOMEN - 1 VIEW  COMPARISON:  Aug 23, 2014  FINDINGS: Orogastric tube tip and side port are in the distal stomach. Bowel gas pattern unremarkable. No obstruction or free air seen on this supine examination.  IMPRESSION: Orogastric tube tip and side-port in distal stomach.   Electronically Signed   By: Lowella Grip III M.D.   On: 08/25/2014 15:19   Dg Chest Port 1 View  08/25/2014   CLINICAL DATA:  Respiratory failure with end-stage renal disease.  EXAM: PORTABLE CHEST - 1 VIEW  COMPARISON:  08/21/2014.  FINDINGS: Endotracheal tube has been advanced since the prior radiograph now lying 2.6 cm above carina. Cardiomegaly. BILATERAL extensive pulmonary opacities consistent with pneumonia or ARDS appear worse compared with 4/29.  Central venous catheter tip remains somewhat low tip in the RIGHT atrium near the tricuspid valve. This could be pulled back 7- 8 cm to lie at the cavoatrial junction. No  pneumothorax.  IMPRESSION: Worsening aeration. BILATERAL extensive pulmonary opacities consistent with pneumonia or ARDS. ETT 2.6 cm above carina. Central venous line tip somewhat low.   Electronically Signed   By: Rolla Flatten M.D.   On: 08/25/2014 15:23     ASSESSMENT / PLAN: 39 year old AAM  patient with ESRD on hemodialysis with the cough, fever, and lactic acidosis with acute metabolic encephlopathy with fevers with acute septic shock with progressive pneumonia and ARDS candidal albicans pneumonia, now with gram negative pneumonia-patient failed trial of extubation -re intubated due to resp muscle fatigue and ineffective ventilation with hypoxia-[facility] will need trach and PEG tube for survival   1.septic shock due to right ML pneumonia with progressive b/l infiltrates. with hypotension /tachycardia  -use vasopressors to keep MAP>60 as needed -follow up ID recs-on antifungal therapy and meropenen/Vanc -get AB\G and LA as needed  2.Pneumonia with Resp failure-patient reintubated -on vent support -chest CT with progressive infiltrates bilateraly  -gram negative species in sputum cultures  3.fevers-with encephalopathy - pneumonia  -follow up Id recs-antifungal therapy and meropenem -U\S of UE with left UE thrombosed dialysis fistula - possibly chronic  4.ESRD on HD -follow up nephrology recs  5.respiratory failure-ARDS-re-intubated for increased WOB and SOB -continue full vent support -follow up abg and cxr as needed -wean fio2 and peep as tolerated -Low lung volume strategy  6.Delerium will consider restarting meds -xanax -seroquel -oxycodone  Will plan for [facility] referral for continued medical care-needs Trach and PEG tube for survival    I have personally obtained a history, examined the patient, evaluated Pertinent laboratory and RadioGraphic/imaging results, and  formulated the assessment and plan   The Patient requires high complexity decision making  for assessment and support, frequent evaluation and titration of therapies, application of advanced monitoring technologies and extensive interpretation of multiple databases. Critical Care Time devoted to patient care services described in this note is 35 minutes.   Overall, patient is critically ill, prognosis is guarded.  Patient with Multiorgan failure and at high risk for cardiac arrest and death.    Corrin Parker, M.D. Pulmonary & [place] Director Intensive Care Unit

## 2014-08-26 NOTE — Care Management (Signed)
Anticipate a 6:30p pick up by [facility].  Provided primary nurse Tillie Rung with contact phone number for Santiago Glad with [facility] in the even there are major problems with the transfer.  HD treatment is in progress and should be completed in time to allow for 6:30 transfer time.   Tillie Rung has not called report yet

## 2014-08-26 NOTE — Progress Notes (Signed)
Remains on ventilator. Will transfer to [facility]. Report given to [facility]. Will be transported by [facility]

## 2014-08-26 NOTE — Care Management (Signed)
Spoke with [name]- patient's mother- [phone].  She is in agreement with pursuing LTAC.  She would like to speak with representatives from [facility].  Notified representatives from both facilities of interest.  Per Dr Mortimer Fries- most likely patient will require peg and trach but most likely will not have to pursue prior to any transfer.  If mother decides to transfer to LTAC, transfer- patient at present is medically stable.

## 2014-08-26 NOTE — Progress Notes (Signed)
Vancomycin Dosing: (Day 18), Meropenem (Day 4),  ESRD,  Constipation, Electrolytes. ConstipationPharmacy consulted to dose vancomycin for this 39yo M being on HD MWF schedule presenting with fever. Patient with possible endovascular infections vs atypical infection. Empiric tamiflu ordered (stop date 4/23).  Patient initiated on clindamycin 600mg  IV Q8hr. Patient's acyclovir discontinued on 4/22. Patient initiated on micafungin 150mg  IV daily on 4/22. Allergies: IVP Dye, Minoxidil, Morphine, OmnipaqueHD MWF schedule, Wt: 116kg, IBW: 82kg, Dosing Wt: 95.9kg.    Goal vanc trough: 15-25 5/3: Vanc trough is 13  Labs are WNL at this time. Will continue patient on docusate 100mg  VT BID and senna 13mL VT Q12hr and add bisacodyl suppository x 1. 4/25:    Meropenem 1000 mg IV Q24H originally ordered.  Will adjust dose toMeropenem 500 mg IV Q24H. Pharmacy will follow and adjust per consult.  Will increase vancomycin to 1750mg  IV to be infused during last hour of dialysis on dialysis days. Next vanc level due 5/9 before HD.

## 2014-08-26 NOTE — Progress Notes (Signed)
Called Dr. Earleen Newport regarding pt not having a CBC and Met B for past two days. No new orders

## 2014-08-27 ENCOUNTER — Other Ambulatory Visit (HOSPITAL_COMMUNITY): Payer: Self-pay

## 2014-08-27 DIAGNOSIS — A419 Sepsis, unspecified organism: Secondary | ICD-10-CM

## 2014-08-27 DIAGNOSIS — R6521 Severe sepsis with septic shock: Secondary | ICD-10-CM

## 2014-08-27 DIAGNOSIS — J80 Acute respiratory distress syndrome: Secondary | ICD-10-CM | POA: Diagnosis not present

## 2014-08-27 DIAGNOSIS — J189 Pneumonia, unspecified organism: Secondary | ICD-10-CM | POA: Diagnosis not present

## 2014-08-27 DIAGNOSIS — J9601 Acute respiratory failure with hypoxia: Secondary | ICD-10-CM | POA: Diagnosis not present

## 2014-08-27 DIAGNOSIS — N186 End stage renal disease: Secondary | ICD-10-CM

## 2014-08-27 DIAGNOSIS — G610 Guillain-Barre syndrome: Secondary | ICD-10-CM

## 2014-08-27 DIAGNOSIS — Z992 Dependence on renal dialysis: Secondary | ICD-10-CM

## 2014-08-27 LAB — COMPREHENSIVE METABOLIC PANEL
ALT: 32 U/L (ref 17–63)
AST: 59 U/L — ABNORMAL HIGH (ref 15–41)
Albumin: 1.8 g/dL — ABNORMAL LOW (ref 3.5–5.0)
Alkaline Phosphatase: 53 U/L (ref 38–126)
Anion gap: 14 (ref 5–15)
BUN: 63 mg/dL — ABNORMAL HIGH (ref 6–20)
CO2: 28 mmol/L (ref 22–32)
Calcium: 7.5 mg/dL — ABNORMAL LOW (ref 8.9–10.3)
Chloride: 99 mmol/L — ABNORMAL LOW (ref 101–111)
Creatinine, Ser: 10.09 mg/dL — ABNORMAL HIGH (ref 0.61–1.24)
GFR, EST AFRICAN AMERICAN: 7 mL/min — AB (ref >60)
GFR, EST NON AFRICAN AMERICAN: 6 mL/min — AB (ref >60)
GLUCOSE: 86 mg/dL (ref 70–99)
Potassium: 4.5 mmol/L (ref 3.5–5.1)
Sodium: 141 mmol/L (ref 135–145)
Total Bilirubin: 1 mg/dL (ref 0.3–1.2)
Total Protein: 5.6 g/dL — ABNORMAL LOW (ref 6.5–8.1)

## 2014-08-27 LAB — BLOOD GAS, ARTERIAL
ACID-BASE EXCESS: 3.9 mmol/L — AB (ref 0.0–3.0)
ALLENS TEST (PASS/FAIL): POSITIVE — AB
BICARBONATE: 30.2 meq/L — AB (ref 21.0–28.0)
FIO2: 0.5 %
LHR: 20 {breaths}/min
MECHVT: 550 mL
O2 Saturation: 98.2 %
PCO2 ART: 51 mmHg — AB (ref 32.0–48.0)
PEEP: 10 cmH2O
PO2 ART: 109 mmHg — AB (ref 83.0–108.0)
Patient temperature: 37
pH, Arterial: 7.38 (ref 7.350–7.450)

## 2014-08-27 LAB — CBC
HEMATOCRIT: 24.3 % — AB (ref 39.0–52.0)
HEMOGLOBIN: 7.6 g/dL — AB (ref 13.0–17.0)
MCH: 29.5 pg (ref 26.0–34.0)
MCHC: 31.3 g/dL (ref 30.0–36.0)
MCV: 94.2 fL (ref 78.0–100.0)
Platelets: 161 10*3/uL (ref 150–400)
RBC: 2.58 MIL/uL — AB (ref 4.22–5.81)
RDW: 16.6 % — ABNORMAL HIGH (ref 11.5–15.5)
WBC: 6.2 10*3/uL (ref 4.0–10.5)

## 2014-08-27 LAB — TSH: TSH: 5.56 u[IU]/mL — AB (ref 0.350–4.500)

## 2014-08-27 NOTE — Consult Note (Signed)
Name: Patrick Downs MRN: [id number] DOB: 02/05/1976    ADMISSION DATE:  08/26/2014 CONSULTATION DATE:  5/5  REFERRING MD :  [facility]  CHIEF COMPLAINT:  Resp failure  BRIEF PATIENT DESCRIPTION: 39 YO AAM on vent  SIGNIFICANT EVENTS    STUDIES:     HISTORY OF PRESENT ILLNESS:   Mr. Pfohl is a 56 AAM with ESRD, hx of Guillain-Barre syndrome with lower ext paresis, who presented to [facility] 08/05/14 with fever and body aches. He progressed to VDRF and failed extubation twice  Due to inability to generate significant tidal volumes plus lethargy. CxR cw ARDS pattern, volume overload, possible bilateral pna. He remains on pressor support and on HD 3 x a week. Seen by infectious disease a [facility] and was on Diflucan, Vanc, Meropenem with only + culture was Candida in sputum. Transferred to  [facility] 5/5 on 50% fio2 and 10 peep with rr 20. He is on levophed to maintain SBP> 90 via rt IJ cvl. PCCM asked to manage vent  PAST MEDICAL HISTORY :   has a past medical history of ESRD (end stage renal disease) on dialysis.  has no past surgical history on file. Prior to Admission medications   Medication Sig Start Date End Date Taking? Authorizing Provider  acetaminophen (TYLENOL) 325 MG tablet Take 2 tablets (650 mg total) by mouth every 4 (four) hours as needed for mild pain or fever (Mild pain (1-3/10) or temp greater than 100.4). 08/26/14   [name], MD  antiseptic oral rinse (CPC / CETYLPYRIDINIUM CHLORIDE 0.05%) 0.05 % LIQD solution 7 mLs by Mouth Rinse route QID. 08/26/14   [name], MD  benzonatate (TESSALON) 100 MG capsule Take 1 capsule (100 mg total) by mouth every 6 (six) hours as needed for cough (For NONPRODUCTIVE cough). 08/26/14   [name], MD  budesonide (PULMICORT) 0.5 MG/2ML nebulizer solution Take 2 mLs (0.5 mg total) by nebulization every 12 (twelve) hours. 08/26/14   [name], MD  calcium acetate (PHOSLO) 667 MG capsule Take 3 capsules (2,001 mg total) by mouth 3 (three)  times daily with meals. 08/26/14   [name], MD  calcium-vitamin D (OSCAL WITH D) 500-200 MG-UNIT per tablet Take 1 tablet by mouth 2 (two) times daily with a meal. 08/26/14   [name], MD  chlorhexidine (PERIDEX) 0.12 % solution 15 mLs by Mouth Rinse route 2 (two) times daily. 08/26/14   [name], MD  docusate (COLACE) 50 MG/5ML liquid Take 10 mLs (100 mg total) by mouth every 12 (twelve) hours. 08/26/14   [name], MD  famotidine (PEPCID) 20 MG tablet Take 1 tablet (20 mg total) by mouth at bedtime. 08/26/14   [name], MD  fentaNYL (SUBLIMAZE) 100 MCG/2ML injection Inject 2 mLs (100 mcg total) into the vein once. 08/26/14   [name], MD  fentaNYL 10 mcg/ml SOLN infusion Inject 30 mcg/hr into the vein continuous. 08/26/14   [name], MD  fluconazole (DIFLUCAN) 400-0.9 MG/200ML-% IVPB Inject 200 mLs (400 mg total) into the vein every other day. 08/26/14   [name], MD  heparin 1000 unit/mL SOLN injection 1 mL (1,000 Units total) by Dialysis route as needed (in dialysis). 08/26/14   [name], MD  heparin 5000 UNIT/ML injection Inject 1 mL (5,000 Units total) into the skin every 12 (twelve) hours. 08/26/14   [name], MD  ibuprofen (ADVIL,MOTRIN) 400 MG tablet Take 1 tablet (400 mg total) by mouth every 6 (six) hours as needed for fever (Give for  fever unrelieved by Tylenol). 08/26/14   [name], MD  ipratropium (ATROVENT) 0.06 % nasal spray Place 2 sprays into both nostrils 2 (two) times daily. 08/26/14   [name], MD  ipratropium-albuterol (DUONEB) 0.5-2.5 (3) MG/3ML SOLN Take 3 mLs by nebulization every 4 (four) hours. 08/26/14   [name], MD  lidocaine, PF, (XYLOCAINE) 1 % SOLN injection Inject 5 mLs into the skin as needed (topical anesthesia for hemodialysis ifGEBAUERS is ineffective.). 08/26/14   [name], MD  lidocaine-prilocaine (EMLA) cream Apply 1 application topically as needed (topical anesthesia for hemodialysis if  Gebauers and Lidocaine injection are ineffective.). 08/26/14   [name], MD  meropenem 500 mg in sodium chloride 0.9 % 50 mL Inject 500 mg into the vein daily. 08/26/14   [name], MD  Midazolam HCl (MIDAZOLAM 50MG  IN NS 50ML, 1MG /ML, PREMIX INFUSION) Inject 1 mg/hr into the vein continuous. 08/26/14   [name], MD  multivitamin (RENA-VIT) TABS tablet Take 1 tablet by mouth at bedtime. 08/26/14   [name], MD  norepinephrine 16 mg in dextrose 5 % 250 mL Inject 2 mcg/min into the vein continuous. 08/26/14   [name], MD  pentafluoroprop-tetrafluoroeth Landry Dyke) AERO Apply 1 application topically as needed (topical anesthesia for hemodialysis). 08/26/14   [name], MD  scopolamine (TRANSDERM-SCOP) 1 MG/3DAYS Place 1 patch (1.5 mg total) onto the skin every 3 (three) days. 08/26/14   [name], MD  sennosides (SENOKOT) 8.8 MG/5ML syrup Take 10 mLs by mouth every 12 (twelve) hours. 08/26/14   [name], MD  sodium chloride 0.9 % injection Inject 3-6 mLs into the vein every 10 (ten) minutes as needed. 08/26/14   [name], MD  sodium chloride 0.9 % injection 10-40 mLs by Intracatheter route every 12 (twelve) hours. 08/26/14   [name], MD  vancomycin 1,750 mg in sodium chloride 0.9 % 500 mL Inject 1,750 mg into the vein every Monday, Wednesday, and Friday with hemodialysis. 08/26/14   [name], MD  vecuronium (NORCURON) 10 MG injection Inject 10 mLs (10 mg total) into the vein once. 08/26/14   [name], MD   Allergies  Allergen Reactions  . Ivp Dye [Iodinated Diagnostic Agents] Other (See Comments)    Reaction unknown  . Omnipaque [Iohexol] Other (See Comments)    Reaction unknown  . Minoxidil Other (See Comments)    Reaction unknown  . Morphine And Related Other (See Comments)    Reaction unknown    FAMILY HISTORY:  na SOCIAL HISTORY: na  REVIEW OF SYSTEMS:   NA   SUBJECTIVE:  Vent:      VITAL SIGNS: 121/72-> levophed at  10 mcg Hr st 102 RR 20 Temp 101 F Sats 95%-> FIO2 50 % AND 10 PEEP  PHYSICAL EXAMINATION: General: Obese AAM Neuro:  Tries to communicate. Moves upper ext, no movement LE at this time. Tracks and follows HEENT:  Ott-> vent, rt i j cvl Cardiovascular:  HSR RR Lungs: coarse rhonchi bilat Abdomen:  Obese + bs . TF going Musculoskeletal:  Lower ext wasting Skin:  warm   Recent Labs Lab 08/23/14 0725 08/24/14 1500 08/27/14 0609  NA 135 136 141  K 5.2* 5.9* 4.5  CL 94* 95* 99*  CO2 27 23 28   BUN 81* 97* 63*  CREATININE 10.52* 12.78* 10.09*  GLUCOSE 149* 94 86    Recent Labs Lab 08/23/14 0725  08/24/14 1500 08/24/14 2000 08/25/14 0501 08/27/14 0609  HGB 7.0*  < > 6.6* 8.0* 7.7* 7.6*  HCT 21.7*  --  20.4* 24.6*  --  24.3*  WBC 10.5  --  7.3  --   --  6.2  PLT 218  --  187  --   --  161  < > = values in this interval not displayed. Dg Abd 1 View  08/25/2014   CLINICAL DATA:  Orogastric tube placement  EXAM: ABDOMEN - 1 VIEW  COMPARISON:  Aug 23, 2014  FINDINGS: Orogastric tube tip and side port are in the distal stomach. Bowel gas pattern unremarkable. No obstruction or free air seen on this supine examination.  IMPRESSION: Orogastric tube tip and side-port in distal stomach.   Electronically Signed   By: Lowella Grip III M.D.   On: 08/25/2014 15:19   Dg Chest Port 1 View  08/27/2014   CLINICAL DATA:  Acute respiratory failure .  EXAM: PORTABLE CHEST - 1 VIEW  COMPARISON:  08/25/2014.  CT 08/21/2014.  FINDINGS: Endotracheal tube, NG tube, right IJ line in stable position. Stable cardiomegaly. Diffuse bilateral pulmonary infiltrates are present and are unchanged. No pneumothorax. Left subclavian vascular stent again noted.  IMPRESSION: 1. Lines and tubes in stable position. 2. Persistent unchanged diffuse bilateral airspace disease.   Electronically Signed   By: Marcello Moores  Register   On: 08/27/2014 07:15   Dg Chest Port 1 View  08/25/2014   CLINICAL DATA:  Respiratory failure  with end-stage renal disease.  EXAM: PORTABLE CHEST - 1 VIEW  COMPARISON:  08/21/2014.  FINDINGS: Endotracheal tube has been advanced since the prior radiograph now lying 2.6 cm above carina. Cardiomegaly. BILATERAL extensive pulmonary opacities consistent with pneumonia or ARDS appear worse compared with 4/29.  Central venous catheter tip remains somewhat low tip in the RIGHT atrium near the tricuspid valve. This could be pulled back 7- 8 cm to lie at the cavoatrial junction. No pneumothorax.  IMPRESSION: Worsening aeration. BILATERAL extensive pulmonary opacities consistent with pneumonia or ARDS. ETT 2.6 cm above carina. Central venous line tip somewhat low.   Electronically Signed   By: Rolla Flatten M.D.   On: 08/25/2014 15:23   Dg Abd Portable 1v  08/26/2014   CLINICAL DATA:  Check nasogastric catheter placement  EXAM: PORTABLE ABDOMEN - 1 VIEW  COMPARISON:  None.  FINDINGS: Scattered large and small bowel gas is noted. A nasogastric catheter is noted with the tip in the distal stomach. No other focal abnormality is seen.  IMPRESSION: Nasogastric catheter within the stomach.   Electronically Signed   By: Inez Catalina M.D.   On: 08/26/2014 22:45    ASSESSMENT     Sepsis: currently on levophed at 10 mcg   CAP (community acquired pneumonia) Cultures at [facility] only grew Candidia   ESRD on dialysis. [facility]. Note on pressors, ?? Should he be on CVVH?   A-V fistula rt fa Patent    ARDS (adult respiratory distress syndrome) cxr  With BASDZ.    Septic shock   Hypotension   Acute respiratory failure, VDRF. Not weanable. With a history of Guillain Barre syndrome and administration of NMB at [facility].    Fever   Guillain Barr syndrome, lower ext weakness    Hx of parathyroidectomy  Discussion : Mr. Venecia is a 23 AAM with ESRD, hx of Guillain-Barre syndrome with lower ext paresis, who presented to [facility] 08/05/14 with fever and body aches. He progressed to VDRF and failed extubation twice  Due to inability to  generate significant tidal volumes plus lethargy. CxR cw  ARDS pattern, volume overload, possible bilateral pna. He remains on pressor support and on HD 3 x a week. Seen by infectious disease a [facility] and was on Diflucan, Vanc, Meropenem with only + culture was Candida in sputum. Transferred to  [facility] 5/5 on 50% fio2 and 10 peep with rr 20. He is on levophed to maintain SBP> 90 via rt IJ cvl. PCCM asked to manage vent.  PLAN:  Wean per protocol. But with failed extubation. Hx of GBS and receiving NMB at [facility], tracheostomy may be safest route. Decrease peep to 5  Wean pressor for sbp>90 and /or map>60. Consider CVVH until pressors can be weaned. HD will create hypotension and CVVH is standard of care in this situation.  CAP on abx per [facility]  TSH 5.56 per [facility] Minor ACNP Maryanna Shape PCCM Pager [phone] till 3 pm If no answer page [phone] 08/27/2014, 8:12 AM  39 year old ESRD-HD patient who was intubated in [facility] for GBS induced respiratory failure.  Self extubated 3 times then reintubated last on 5/3.  He remains very deconditioned physically and unable to wean.  He is also in septic shock and is on pressors.  Electrolytes ok for now but will need CVVH.  Acute respiratory failure due to weakness.  - Continue full vent support.  - Will need trach if we are to continue to support him.  - Titrate O2 as able.  Aspiration PNA:  - Continue abx.  - No further extubations given aspiration and weakness.  GBS:   - Unclear to me if treated in [facility] or not.  - Recommend involvement of neurology.  Septic shock due to aspiration pneumonia.  - Continue pressors.  - Titrate for SBP of 90.  ESRD-HD:  - Will need CVVHD not standard HD.  Strongly recommend transferring the patient to [facility], will be happy to accept him on my service.  Discussed with Dr. Radene Gunning who will see patient and call PCCM back.  The patient is critically ill with multiple organ systems failure and requires high complexity  decision making for assessment and support, frequent evaluation and titration of therapies, application of advanced monitoring technologies and extensive interpretation of multiple databases.   Critical Care Time devoted to patient care services described in this note is  35  Minutes. This time reflects time of care of this signee Dr Jennet Maduro. This critical care time does not reflect procedure time, or teaching time or supervisory time of PA/NP/Med student/Med Resident etc but could involve care discussion time.  Rush Farmer, M.D. [facility] Pulmonary/Critical Care Medicine. Pager: [phone]. After hours pager: [phone].

## 2014-08-28 ENCOUNTER — Encounter (HOSPITAL_COMMUNITY): Payer: Self-pay

## 2014-08-28 DIAGNOSIS — G610 Guillain-Barre syndrome: Secondary | ICD-10-CM

## 2014-08-28 DIAGNOSIS — J9600 Acute respiratory failure, unspecified whether with hypoxia or hypercapnia: Secondary | ICD-10-CM | POA: Insufficient documentation

## 2014-08-28 DIAGNOSIS — J80 Acute respiratory distress syndrome: Secondary | ICD-10-CM | POA: Diagnosis not present

## 2014-08-28 DIAGNOSIS — J9601 Acute respiratory failure with hypoxia: Secondary | ICD-10-CM | POA: Diagnosis not present

## 2014-08-28 DIAGNOSIS — Z930 Tracheostomy status: Secondary | ICD-10-CM

## 2014-08-28 DIAGNOSIS — A419 Sepsis, unspecified organism: Secondary | ICD-10-CM | POA: Diagnosis not present

## 2014-08-28 LAB — RENAL FUNCTION PANEL
Albumin: 1.9 g/dL — ABNORMAL LOW (ref 3.5–5.0)
Anion gap: 16 — ABNORMAL HIGH (ref 5–15)
BUN: 75 mg/dL — ABNORMAL HIGH (ref 6–20)
CO2: 25 mmol/L (ref 22–32)
Calcium: 8 mg/dL — ABNORMAL LOW (ref 8.9–10.3)
Chloride: 102 mmol/L (ref 101–111)
Creatinine, Ser: 12.1 mg/dL — ABNORMAL HIGH (ref 0.61–1.24)
GFR, EST AFRICAN AMERICAN: 5 mL/min — AB (ref >60)
GFR, EST NON AFRICAN AMERICAN: 5 mL/min — AB (ref >60)
GLUCOSE: 100 mg/dL — AB (ref 70–99)
Phosphorus: 9.6 mg/dL — ABNORMAL HIGH (ref 2.5–4.6)
Potassium: 4.1 mmol/L (ref 3.5–5.1)
SODIUM: 143 mmol/L (ref 135–145)

## 2014-08-28 LAB — CBC
HEMATOCRIT: 23.1 % — AB (ref 39.0–52.0)
HEMOGLOBIN: 7.3 g/dL — AB (ref 13.0–17.0)
MCH: 29.2 pg (ref 26.0–34.0)
MCHC: 31.6 g/dL (ref 30.0–36.0)
MCV: 92.4 fL (ref 78.0–100.0)
PLATELETS: 157 10*3/uL (ref 150–400)
RBC: 2.5 MIL/uL — ABNORMAL LOW (ref 4.22–5.81)
RDW: 15.9 % — ABNORMAL HIGH (ref 11.5–15.5)
WBC: 3.9 10*3/uL — AB (ref 4.0–10.5)

## 2014-08-28 LAB — PROTIME-INR
INR: 1.23 (ref 0.00–1.49)
Prothrombin Time: 15.6 seconds — ABNORMAL HIGH (ref 11.6–15.2)

## 2014-08-28 LAB — VANCOMYCIN, RANDOM: Vancomycin Rm: 19 ug/mL

## 2014-08-28 LAB — APTT: aPTT: 41 seconds — ABNORMAL HIGH (ref 24–37)

## 2014-08-28 NOTE — Procedures (Signed)
Percutaneous Tracheostomy Placement  Consent from family.  Patient sedated, paralyzed and position.  Placed on 100% FiO2 and RR matched.  Area cleaned and draped.  Lidocaine/epi injected.  Skin incision done followed by blunt dissection.  Trachea palpated then punctured, catheter passed and visualized bronchoscopically.  Wire placed and visualized.  Catheter removed.  Airway then crushed and dilated.  Size 6 cuffed shiley trach placed and visualized bronchoscopically well above carina.  Good volume returns.  Patient tolerated the procedure well without complications.  Minimal blood loss.  CXR ordered and pending.  Wesam G. Yacoub, M.D. [place] Pulmonary/Critical Care Medicine. Pager: [phone]. After hours pager: [phone]. 

## 2014-08-28 NOTE — Procedures (Signed)
Bronchoscopy  for Percutaneous  Tracheostomy  Name: Patrick Downs MRN: [id number] DOB: [birth year] Procedure: Bronchoscopy for Percutaneous Tracheostomy Indications: Diagnostic evaluation of the airways In conjunction with: Dr. Nelda Marseille  Procedure Details Consent: Risks of procedure as well as the alternatives and risks of each were explained to the (patient/caregiver).  Consent for procedure obtained. Time Out: Verified patient identification, verified procedure, site/side was marked, verified correct patient position, special equipment/implants available, medications/allergies/relevent history reviewed, required imaging and test results available.  Performed  In preparation for procedure, patient was given 100% FiO2 and bronchoscope lubricated. Sedation: Benzodiazepines, Muscle relaxants and Etomidate  Airway entered and the following bronchi were examined: RML.   Procedures performed: Endotracheal Tube retracted in 2 cm increments. Cannulation of airway observed. Dilation observed. Placement of trachel tube  observed . No overt complications. Bronchoscope removed.    Evaluation Hemodynamic Status: BP stable throughout; O2 sats: stable throughout Patient's Current Condition: stable Specimens:  None Complications: No apparent complications Patient did tolerate procedure well.   Patrick Downs ACNP Maryanna Shape PCCM Pager [phone] till 3 pm If no answer page [phone] 08/28/2014, 11:39 AM

## 2014-08-28 NOTE — Consult Note (Signed)
Name: Patrick Downs MRN: [id number] DOB: [birth year]    ADMISSION DATE:  08/26/2014 CONSULTATION DATE:  5/5  REFERRING MD :  [facility]  CHIEF COMPLAINT:  Resp failure  BRIEF PATIENT DESCRIPTION: 39 YO AAM on vent  SIGNIFICANT EVENTS    STUDIES:     HISTORY OF PRESENT ILLNESS:   Patrick Downs is a 56 AAM with ESRD, hx of Guillain-Barre syndrome with lower ext paresis, who presented to [facility] 08/05/14 with fever and body aches. He progressed to VDRF and failed extubation twice  Due to inability to generate significant tidal volumes plus lethargy. CxR cw ARDS pattern, volume overload, possible bilateral pna. He remains on pressor support and on HD 3 x a week. Seen by infectious disease a [facility] and was on Diflucan, Vanc, Meropenem with only + culture was Candida in sputum. Transferred to  [facility] 5/5 on 50% fio2 and 10 peep with rr 20. He is on levophed to maintain SBP> 90 via rt IJ cvl. PCCM asked to manage vent  PAST MEDICAL HISTORY :   has a past medical history of ESRD (end stage renal disease) on dialysis.  has no past surgical history on file. Prior to Admission medications   Medication Sig Start Date End Date Taking? Authorizing Provider  acetaminophen (TYLENOL) 325 MG tablet Take 2 tablets (650 mg total) by mouth every 4 (four) hours as needed for mild pain or fever (Mild pain (1-3/10) or temp greater than 100.4). 08/26/14   [name], MD  antiseptic oral rinse (CPC / CETYLPYRIDINIUM CHLORIDE 0.05%) 0.05 % LIQD solution 7 mLs by Mouth Rinse route QID. 08/26/14   [name], MD  benzonatate (TESSALON) 100 MG capsule Take 1 capsule (100 mg total) by mouth every 6 (six) hours as needed for cough (For NONPRODUCTIVE cough). 08/26/14   [name], MD  budesonide (PULMICORT) 0.5 MG/2ML nebulizer solution Take 2 mLs (0.5 mg total) by nebulization every 12 (twelve) hours. 08/26/14   [name], MD  calcium acetate (PHOSLO) 667 MG capsule Take 3 capsules (2,001 mg total) by mouth 3 (three)  times daily with meals. 08/26/14   [name], MD  calcium-vitamin D (OSCAL WITH D) 500-200 MG-UNIT per tablet Take 1 tablet by mouth 2 (two) times daily with a meal. 08/26/14   [name], MD  chlorhexidine (PERIDEX) 0.12 % solution 15 mLs by Mouth Rinse route 2 (two) times daily. 08/26/14   [name], MD  docusate (COLACE) 50 MG/5ML liquid Take 10 mLs (100 mg total) by mouth every 12 (twelve) hours. 08/26/14   [name], MD  famotidine (PEPCID) 20 MG tablet Take 1 tablet (20 mg total) by mouth at bedtime. 08/26/14   [name], MD  fentaNYL (SUBLIMAZE) 100 MCG/2ML injection Inject 2 mLs (100 mcg total) into the vein once. 08/26/14   [name], MD  fentaNYL 10 mcg/ml SOLN infusion Inject 30 mcg/hr into the vein continuous. 08/26/14   [name], MD  fluconazole (DIFLUCAN) 400-0.9 MG/200ML-% IVPB Inject 200 mLs (400 mg total) into the vein every other day. 08/26/14   [name], MD  heparin 1000 unit/mL SOLN injection 1 mL (1,000 Units total) by Dialysis route as needed (in dialysis). 08/26/14   [name], MD  heparin 5000 UNIT/ML injection Inject 1 mL (5,000 Units total) into the skin every 12 (twelve) hours. 08/26/14   [name], MD  ibuprofen (ADVIL,MOTRIN) 400 MG tablet Take 1 tablet (400 mg total) by mouth every 6 (six) hours as needed for fever (Give for  fever unrelieved by Tylenol). 08/26/14   [name], MD  ipratropium (ATROVENT) 0.06 % nasal spray Place 2 sprays into both nostrils 2 (two) times daily. 08/26/14   [name], MD  ipratropium-albuterol (DUONEB) 0.5-2.5 (3) MG/3ML SOLN Take 3 mLs by nebulization every 4 (four) hours. 08/26/14   [name], MD  lidocaine, PF, (XYLOCAINE) 1 % SOLN injection Inject 5 mLs into the skin as needed (topical anesthesia for hemodialysis ifGEBAUERS is ineffective.). 08/26/14   [name], MD  lidocaine-prilocaine (EMLA) cream Apply 1 application topically as needed (topical anesthesia for hemodialysis if  Gebauers and Lidocaine injection are ineffective.). 08/26/14   [name], MD  meropenem 500 mg in sodium chloride 0.9 % 50 mL Inject 500 mg into the vein daily. 08/26/14   [name], MD  Midazolam HCl (MIDAZOLAM 50MG  IN NS 50ML, 1MG /ML, PREMIX INFUSION) Inject 1 mg/hr into the vein continuous. 08/26/14   [name], MD  multivitamin (RENA-VIT) TABS tablet Take 1 tablet by mouth at bedtime. 08/26/14   [name], MD  norepinephrine 16 mg in dextrose 5 % 250 mL Inject 2 mcg/min into the vein continuous. 08/26/14   [name], MD  pentafluoroprop-tetrafluoroeth Landry Dyke) AERO Apply 1 application topically as needed (topical anesthesia for hemodialysis). 08/26/14   [name], MD  scopolamine (TRANSDERM-SCOP) 1 MG/3DAYS Place 1 patch (1.5 mg total) onto the skin every 3 (three) days. 08/26/14   [name], MD  sennosides (SENOKOT) 8.8 MG/5ML syrup Take 10 mLs by mouth every 12 (twelve) hours. 08/26/14   [name], MD  sodium chloride 0.9 % injection Inject 3-6 mLs into the vein every 10 (ten) minutes as needed. 08/26/14   [name], MD  sodium chloride 0.9 % injection 10-40 mLs by Intracatheter route every 12 (twelve) hours. 08/26/14   [name], MD  vancomycin 1,750 mg in sodium chloride 0.9 % 500 mL Inject 1,750 mg into the vein every Monday, Wednesday, and Friday with hemodialysis. 08/26/14   [name], MD  vecuronium (NORCURON) 10 MG injection Inject 10 mLs (10 mg total) into the vein once. 08/26/14   [name], MD   Allergies  Allergen Reactions  . Ivp Dye [Iodinated Diagnostic Agents] Other (See Comments)    Reaction unknown  . Omnipaque [Iohexol] Other (See Comments)    Reaction unknown  . Minoxidil Other (See Comments)    Reaction unknown  . Morphine And Related Other (See Comments)    Reaction unknown    FAMILY HISTORY:  na SOCIAL HISTORY: na  REVIEW OF SYSTEMS:   NA   SUBJECTIVE:  Vent:      VITAL SIGNS: 121/72-> levophed at  10 mcg Hr st 102 RR 20 Temp 101 F Sats 95%-> FIO2 50 % AND 10 PEEP  PHYSICAL EXAMINATION: General: Obese AAM Neuro:  Tries to communicate. Moves upper ext, no movement LE at this time. Tracks and follows HEENT:  Ott-> vent, rt i j cvl Cardiovascular:  HSR RR Lungs: coarse rhonchi bilat Abdomen:  Obese + bs . TF going Musculoskeletal:  Lower ext wasting Skin:  warm   Recent Labs Lab 08/24/14 1500 08/27/14 0609 08/28/14 1118  NA 136 141 143  K 5.9* 4.5 4.1  CL 95* 99* 102  CO2 23 28 25   BUN 97* 63* 75*  CREATININE 12.78* 10.09* 12.10*  GLUCOSE 94 86 100*    Recent Labs Lab 08/24/14 1500 08/24/14 2000 08/25/14 0501 08/27/14 0609 08/28/14 1015  HGB 6.6* 8.0* 7.7* 7.6* 7.3*  HCT 20.4* 24.6*  --  24.3* 23.1*  WBC 7.3  --   --  6.2 3.9*  PLT 187  --   --  161 157   Dg Chest Port 1 View  08/27/2014   CLINICAL DATA:  Acute respiratory failure .  EXAM: PORTABLE CHEST - 1 VIEW  COMPARISON:  08/25/2014.  CT 08/21/2014.  FINDINGS: Endotracheal tube, NG tube, right IJ line in stable position. Stable cardiomegaly. Diffuse bilateral pulmonary infiltrates are present and are unchanged. No pneumothorax. Left subclavian vascular stent again noted.  IMPRESSION: 1. Lines and tubes in stable position. 2. Persistent unchanged diffuse bilateral airspace disease.   Electronically Signed   By: Marcello Moores  Register   On: 08/27/2014 07:15   Dg Abd Portable 1v  08/26/2014   CLINICAL DATA:  Check nasogastric catheter placement  EXAM: PORTABLE ABDOMEN - 1 VIEW  COMPARISON:  None.  FINDINGS: Scattered large and small bowel gas is noted. A nasogastric catheter is noted with the tip in the distal stomach. No other focal abnormality is seen.  IMPRESSION: Nasogastric catheter within the stomach.   Electronically Signed   By: Inez Catalina M.D.   On: 08/26/2014 22:45    ASSESSMENT     Sepsis: currently on levophed at 10 mcg   CAP (community acquired pneumonia) Cultures at [facility] only grew Candidia   ESRD on  dialysis. M W F. Note on pressors, ?? Should he be on CVVH?   A-V fistula rt fa Patent    ARDS (adult respiratory distress syndrome) cxr  With BASDZ.    Septic shock   Hypotension   Acute respiratory failure, VDRF. Not weanable. With a history of Guillain Barre syndrome and administration of NMB at [facility].    Fever   Guillain Barr syndrome, lower ext weakness    Hx of parathyroidectomy  Discussion : Mr. Bertsch is a 24 AAM with ESRD, hx of Guillain-Barre syndrome with lower ext paresis, who presented to [facility] 08/05/14 with fever and body aches. He progressed to VDRF and failed extubation twice  Due to inability to generate significant tidal volumes plus lethargy. CxR cw ARDS pattern, volume overload, possible bilateral pna. He remains on pressor support and on HD 3 x a week. Seen by infectious disease a [facility] and was on Diflucan, Vanc, Meropenem with only + culture was Candida in sputum. Transferred to  [facility] 5/5 on 50% fio2 and 10 peep with rr 20. He is on levophed to maintain SBP> 90 via rt IJ cvl. PCCM asked to manage vent.  PLAN:  Wean per protocol. But with failed extubation. Hx of GBS and receiving NMB at [facility], tracheostomy may be safest route. Decrease peep to 5  Wean pressor for sbp>90 and /or map>60. Consider CVVH until pressors can be weaned. HD will create hypotension and CVVH is standard of care in this situation.  39 year old ESRD-HD patient who was intubated in [facility] for GBS induced respiratory failure.  Self extubated 3 times then reintubated last on 5/3.  He remains very deconditioned physically and unable to wean.  He is also in septic shock and is on pressors.  Electrolytes ok for now but will need CVVH.  Acute respiratory failure due to weakness.  - Continue full vent support.  - Plan on tracheostomy today.  - Titrate O2 as able.  Aspiration PNA:  - Continue abx.  - Will reculture during bronchoscopy.  GBS:   - Unclear to me if treated in [facility] or not.  - Recommend  involvement of neurology.  Septic  shock due to aspiration pneumonia.  - Currently off pressors but BP remains soft.  - Titrate for SBP of 90 given ESRD.  ESRD-HD:  - Proceed with HD now that patient is off pressors.  The patient is critically ill with multiple organ systems failure and requires high complexity decision making for assessment and support, frequent evaluation and titration of therapies, application of advanced monitoring technologies and extensive interpretation of multiple databases.   Critical Care Time devoted to patient care services described in this note is  35  Minutes. This time reflects time of care of this signee Dr Jennet Maduro. This critical care time does not reflect procedure time, or teaching time or supervisory time of PA/NP/Med student/Med Resident etc but could involve care discussion time.  Rush Farmer, M.D. [facility] Pulmonary/Critical Care Medicine. Pager: [phone]. After hours pager: [phone].

## 2014-08-28 NOTE — Progress Notes (Signed)
[facility]  ROUNDING NOTE   Subjective:  Pt seen at bedside, well known to [place] from [facility].  On the vent, fio2 50%  Objective:  97.0 80 20 116/70 General: critically ill, back on vent Head:  ETT in place eyes closed Neck: trachea midline, supple CVS: S1S2 no rubs Resp: bilateral rhonchi, vent assisted ABD: soft NTND BS present EXT: trace b/l LE edema Neuro: on vent Access: Access: right arm AVG with fistular component.    Basic Metabolic Panel:  Recent Labs Lab 08/22/14 0653 08/23/14 0725 08/24/14 1500 08/27/14 0609  NA 136 135 136 141  K 5.1 5.2* 5.9* 4.5  CL 95* 94* 95* 99*  CO2 27 27 23 28   GLUCOSE 176* 149* 94 86  BUN 71* 81* 97* 63*  CREATININE 8.87* 10.52* 12.78* 10.09*  CALCIUM 7.1* 6.7* 6.6* 7.5*  MG  --  2.6*  --   --   PHOS  --  7.1* 9.3*  --     Liver Function Tests:  Recent Labs Lab 08/23/14 0725 08/24/14 1500 08/27/14 0609  AST 68*  --  59*  ALT 58  --  32  ALKPHOS 79  --  53  BILITOT 0.7  --  1.0  PROT 6.4*  --  5.6*  ALBUMIN 2.0* 2.0* 1.8*   No results for input(s): LIPASE, AMYLASE in the last 168 hours. No results for input(s): AMMONIA in the last 168 hours.  CBC:  Recent Labs Lab 08/22/14 0653  08/23/14 0725 08/24/14 0430 08/24/14 1500 08/24/14 2000 08/25/14 0501 08/27/14 0609  WBC 8.4  --  10.5  --  7.3  --   --  6.2  NEUTROABS 5.7  --  7.5*  --   --   --   --   --   HGB 7.7*  < > 7.0* 6.1* 6.6* 8.0* 7.7* 7.6*  HCT 23.9*  --  21.7*  --  20.4* 24.6*  --  24.3*  MCV 92  --  91.0  --  90.9  --   --  94.2  PLT 195  --  218  --  187  --   --  161  < > = values in this interval not displayed.  Cardiac Enzymes: No results for input(s): CKTOTAL, CKMB, CKMBINDEX, TROPONINI in the last 168 hours.  BNP: Invalid input(s): POCBNP  CBG:  Recent Labs Lab 08/24/14 0729 08/26/14 0749  GLUCAP 94 93    Microbiology: Results for orders placed or performed during the hospital encounter of 08/05/14  Influenza A&B  Antigens [facility])     Status: None   Collection Time: 08/05/14  5:49 PM  Result Value Ref Range Status   Micro Text Report   Final       COMMENT                   NEGATIVE FOR INFLUENZA A (ANTIGEN ABSENT)   COMMENT                   NEGATIVE FOR INFLUENZA B (ANTIGEN ABSENT)   ANTIBIOTIC                                                      Culture, blood (single)     Status: None   Collection Time: 08/05/14  6:26 PM  Result  Value Ref Range Status   Micro Text Report   Final       COMMENT                   NO GROWTH AEROBICALLY/ANAEROBICALLY IN 5 DAYS   ANTIBIOTIC                                                      Culture, blood (single)     Status: None   Collection Time: 08/05/14  6:41 PM  Result Value Ref Range Status   Micro Text Report   Final       COMMENT                   NO GROWTH AEROBICALLY/ANAEROBICALLY IN 5 DAYS   ANTIBIOTIC                                                      Culture, blood (single)     Status: None   Collection Time: 08/05/14 10:15 PM  Result Value Ref Range Status   Micro Text Report   Final       COMMENT                   NO GROWTH AEROBICALLY/ANAEROBICALLY IN 5 DAYS   ANTIBIOTIC                                                      Culture, blood (single)     Status: None   Collection Time: 08/07/14 10:30 AM  Result Value Ref Range Status   Micro Text Report   Final       COMMENT                   NO GROWTH AEROBICALLY/ANAEROBICALLY IN 5 DAYS   ANTIBIOTIC                                                      Culture, blood (single)     Status: None   Collection Time: 08/07/14 10:43 AM  Result Value Ref Range Status   Micro Text Report   Final       COMMENT                   NO GROWTH AEROBICALLY/ANAEROBICALLY IN 5 DAYS   ANTIBIOTIC                                                      Culture, blood (single)     Status: None   Collection Time: 08/10/14  6:43 PM  Result Value Ref Range Status   Micro Text Report   Final  COMMENT                   NO GROWTH AEROBICALLY/ANAEROBICALLY IN 5 DAYS   ANTIBIOTIC                                                      Culture, blood (single)     Status: None   Collection Time: 08/10/14  6:43 PM  Result Value Ref Range Status   Micro Text Report   Final       COMMENT                   NO GROWTH AEROBICALLY/ANAEROBICALLY IN 5 DAYS   ANTIBIOTIC                                                      Culture, blood (single)     Status: None   Collection Time: 08/13/14 12:40 AM  Result Value Ref Range Status   Micro Text Report   Final       COMMENT                   NO GROWTH AEROBICALLY/ANAEROBICALLY IN 5 DAYS   ANTIBIOTIC                                                      Culture, blood (single)     Status: None   Collection Time: 08/13/14 12:50 AM  Result Value Ref Range Status   Micro Text Report   Final       COMMENT                   NO GROWTH AEROBICALLY/ANAEROBICALLY IN 5 DAYS   ANTIBIOTIC                                                      Culture, fungus without smear     Status: None (Preliminary result)   Collection Time: 08/13/14  8:40 AM  Result Value Ref Range Status   Micro Text Report   Preliminary       SOURCE: BRONCH WASHING    ORGANISM 1                Candida albicans   COMMENT                   -   COMMENT                   -   ANTIBIOTIC                    ORG#1  Bronchial Wash Culture     Status: None   Collection Time: 08/13/14  8:40 AM  Result Value Ref Range Status   Micro Text Report   Final       ORGANISM 1                LIGHT GROWTH Candida albicans   COMMENT                   -   ANTIBIOTIC                    ORG#1                                               Influenza A,B,H1N1 - PCR [facility])     Status: None   Collection Time: 08/15/14  2:20 PM  Result Value Ref Range Status   Influenza A By PCR NEGATIVE NEGATIVE Final   Influenza B By PCR NEGATIVE  NEGATIVE Final   H1N1 flu by pcr NOT DETECTED NOT-DETECTED Final    Comment:                  ----------------------- The Xpert Flu assay (FDA approval for nasal aspirates or washes and nasopharyngeal swab specimens), is intended as an aid in the diagnosis of influenza and should not be used as a sole basis for treatment.   Culture, expectorated sputum-assessment     Status: None   Collection Time: 08/21/14 12:36 PM  Result Value Ref Range Status   Micro Text Report   Final       SOURCE: ETS    COMMENT                   APPEARS TO BE NORMAL FLORA AT 48 HOURS   GRAM STAIN                FEW WHITE BLOOD CELLS RARE GRAM NEGATIVE DIPLOCOCCI   GRAM STAIN                RARE GRAM POSITIVE COCCI   GRAM STAIN                GOOD SPECIMEN-80-90% WBC   GRAM STAIN                RARE GRAM NEGATIVE COCCO-BACILLI   ANTIBIOTIC                                                      Culture, blood (single)     Status: None (Preliminary result)   Collection Time: 08/21/14  3:31 PM  Result Value Ref Range Status   Micro Text Report   Preliminary       COMMENT                   NO GROWTH IN 36 HOURS   ANTIBIOTIC  Culture, blood (single)     Status: None (Preliminary result)   Collection Time: 08/21/14  5:31 PM  Result Value Ref Range Status   Micro Text Report   Preliminary       COMMENT                   NO GROWTH IN 36 HOURS   ANTIBIOTIC                                                      Culture, bal-quantitative     Status: None (Preliminary result)   Collection Time: 08/25/14  1:48 PM  Result Value Ref Range Status   Specimen Description BRONCHIAL ALVEOLAR LAVAGE  Final   Special Requests NONE  Final   Gram Stain PENDING  Incomplete   Colony Count PENDING  Incomplete   Culture APPEARS TO NORMAL FLORA AT 36 HOURS  Final   Report Status PENDING  Incomplete    Coagulation Studies: No results for input(s): LABPROT, INR in the  last 72 hours.  Urinalysis: No results for input(s): COLORURINE, LABSPEC, PHURINE, GLUCOSEU, HGBUR, BILIRUBINUR, KETONESUR, PROTEINUR, UROBILINOGEN, NITRITE, LEUKOCYTESUR in the last 72 hours.  Invalid input(s): APPERANCEUR    Imaging: Dg Chest Port 1 View  08/27/2014   CLINICAL DATA:  Acute respiratory failure .  EXAM: PORTABLE CHEST - 1 VIEW  COMPARISON:  08/25/2014.  CT 08/21/2014.  FINDINGS: Endotracheal tube, NG tube, right IJ line in stable position. Stable cardiomegaly. Diffuse bilateral pulmonary infiltrates are present and are unchanged. No pneumothorax. Left subclavian vascular stent again noted.  IMPRESSION: 1. Lines and tubes in stable position. 2. Persistent unchanged diffuse bilateral airspace disease.   Electronically Signed   By: Marcello Moores  Register   On: 08/27/2014 07:15   Dg Abd Portable 1v  08/26/2014   CLINICAL DATA:  Check nasogastric catheter placement  EXAM: PORTABLE ABDOMEN - 1 VIEW  COMPARISON:  None.  FINDINGS: Scattered large and small bowel gas is noted. A nasogastric catheter is noted with the tip in the distal stomach. No other focal abnormality is seen.  IMPRESSION: Nasogastric catheter within the stomach.   Electronically Signed   By: Inez Catalina M.D.   On: 08/26/2014 22:45     Medications:       Assessment/ Plan:  39 y.o. Black male with End Stage Renal Disease on MWF schedule with right arm AVF/AVG followed by [facility] [address], hypertension, hyperlipidemia, Guillain-Barre syndrome, admission for fever 08/05/14  1. ESRD: MWF HD: N18.6 [place]. [facility] Neph[facility]. - Due for HD today, will prepare orders, UF target 1.5kg.   2. Acute respiratory failure with pneumonia: J96.00, J15.9.  -back on the vent at this time, continue vent support, fio2 50%.   3. Anemia of Chronic Kidney Disease: D63.1  hgb 7.7, will consider adding aranesp next week.  4. Secondary Hyperparathyroidism:N25.81 -check phos with next HD.   LOS:  Uldine Fuster,  Tahni Porchia 5/6/20169:31 AM

## 2014-08-28 NOTE — Procedures (Deleted)
Bronchoscopy  for Percutaneous  Tracheostomy  Name: HASSAN ALOISI MRN: [id number] DOB: 28-Dec-1975 Procedure: Bronchoscopy for Percutaneous Tracheostomy Indications: Diagnostic evaluation of the airways In conjunction with: Dr. Nelda Marseille  Procedure Details Consent: Risks of procedure as well as the alternatives and risks of each were explained to the (patient/caregiver).  Consent for procedure obtained. Time Out: Verified patient identification, verified procedure, site/side was marked, verified correct patient position, special equipment/implants available, medications/allergies/relevent history reviewed, required imaging and test results available.  Performed  In preparation for procedure, patient was given 100% FiO2 and bronchoscope lubricated. Sedation: Benzodiazepines, Muscle relaxants and Etomidate  Airway entered and the following bronchi were examined: RML.   Procedures performed: Endotracheal Tube retracted in 2 cm increments. Cannulation of airway observed. Dilation observed. Placement of trachel tube  observed . No overt complications. Bronchoscope removed.    Evaluation Hemodynamic Status: BP stable throughout; O2 sats: stable throughout Patient's Current Condition: stable Specimens:  None Complications: No apparent complications Patient did tolerate procedure well.   Richardson Landry Odarius Dines ACNP Maryanna Shape PCCM Pager [phone] till 3 pm If no answer page [phone] 08/28/2014, 10:57 AM

## 2014-08-29 LAB — HEPATITIS PANEL, ACUTE
HCV AB: NEGATIVE
HEP B C IGM: NONREACTIVE
Hep A IgM: NONREACTIVE
Hepatitis B Surface Ag: NEGATIVE

## 2014-08-29 LAB — BASIC METABOLIC PANEL
ANION GAP: 17 — AB (ref 5–15)
BUN: 79 mg/dL — ABNORMAL HIGH (ref 6–20)
CO2: 23 mmol/L (ref 22–32)
CREATININE: 13.08 mg/dL — AB (ref 0.61–1.24)
Calcium: 8.1 mg/dL — ABNORMAL LOW (ref 8.9–10.3)
Chloride: 99 mmol/L — ABNORMAL LOW (ref 101–111)
GFR calc Af Amer: 5 mL/min — ABNORMAL LOW (ref >60)
GFR, EST NON AFRICAN AMERICAN: 4 mL/min — AB (ref >60)
Glucose, Bld: 93 mg/dL (ref 70–99)
Potassium: 4.2 mmol/L (ref 3.5–5.1)
SODIUM: 139 mmol/L (ref 135–145)

## 2014-08-29 LAB — HEPATITIS B SURFACE ANTIGEN: Hepatitis B Surface Ag: NEGATIVE

## 2014-08-29 LAB — CBC
HCT: 23.6 % — ABNORMAL LOW (ref 39.0–52.0)
HEMOGLOBIN: 7.5 g/dL — AB (ref 13.0–17.0)
MCH: 28.7 pg (ref 26.0–34.0)
MCHC: 31.8 g/dL (ref 30.0–36.0)
MCV: 90.4 fL (ref 78.0–100.0)
Platelets: 150 10*3/uL (ref 150–400)
RBC: 2.61 MIL/uL — ABNORMAL LOW (ref 4.22–5.81)
RDW: 16 % — AB (ref 11.5–15.5)
WBC: 3.7 10*3/uL — ABNORMAL LOW (ref 4.0–10.5)

## 2014-08-30 ENCOUNTER — Other Ambulatory Visit (HOSPITAL_COMMUNITY): Payer: Self-pay

## 2014-08-30 LAB — RENAL FUNCTION PANEL
ALBUMIN: 2.1 g/dL — AB (ref 3.5–5.0)
Anion gap: 15 (ref 5–15)
BUN: 49 mg/dL — ABNORMAL HIGH (ref 6–20)
CALCIUM: 8 mg/dL — AB (ref 8.9–10.3)
CHLORIDE: 100 mmol/L — AB (ref 101–111)
CO2: 25 mmol/L (ref 22–32)
CREATININE: 9.96 mg/dL — AB (ref 0.61–1.24)
GFR calc Af Amer: 7 mL/min — ABNORMAL LOW (ref >60)
GFR, EST NON AFRICAN AMERICAN: 6 mL/min — AB (ref >60)
Glucose, Bld: 87 mg/dL (ref 70–99)
Phosphorus: 7.8 mg/dL — ABNORMAL HIGH (ref 2.5–4.6)
Potassium: 4.2 mmol/L (ref 3.5–5.1)
SODIUM: 140 mmol/L (ref 135–145)

## 2014-08-30 LAB — CBC
HCT: 22.6 % — ABNORMAL LOW (ref 39.0–52.0)
Hemoglobin: 6.9 g/dL — CL (ref 13.0–17.0)
MCH: 28.3 pg (ref 26.0–34.0)
MCHC: 30.5 g/dL (ref 30.0–36.0)
MCV: 92.6 fL (ref 78.0–100.0)
Platelets: 147 10*3/uL — ABNORMAL LOW (ref 150–400)
RBC: 2.44 MIL/uL — ABNORMAL LOW (ref 4.22–5.81)
RDW: 15.9 % — AB (ref 11.5–15.5)
WBC: 3.4 10*3/uL — ABNORMAL LOW (ref 4.0–10.5)

## 2014-08-30 LAB — HEPATITIS B CORE ANTIBODY, TOTAL: Hep B Core Total Ab: NEGATIVE

## 2014-08-31 DIAGNOSIS — J80 Acute respiratory distress syndrome: Secondary | ICD-10-CM | POA: Diagnosis not present

## 2014-08-31 DIAGNOSIS — Z4659 Encounter for fitting and adjustment of other gastrointestinal appliance and device: Secondary | ICD-10-CM | POA: Diagnosis not present

## 2014-08-31 DIAGNOSIS — J189 Pneumonia, unspecified organism: Secondary | ICD-10-CM | POA: Diagnosis not present

## 2014-08-31 DIAGNOSIS — J9600 Acute respiratory failure, unspecified whether with hypoxia or hypercapnia: Secondary | ICD-10-CM | POA: Diagnosis not present

## 2014-08-31 LAB — VANCOMYCIN, RANDOM: Vancomycin Rm: 31 ug/mL

## 2014-08-31 LAB — ABO/RH: ABO/RH(D): O POS

## 2014-08-31 LAB — APTT: APTT: 52 s — AB (ref 24–37)

## 2014-08-31 LAB — PREPARE RBC (CROSSMATCH)

## 2014-08-31 LAB — PROTIME-INR
INR: 1.46 (ref 0.00–1.49)
Prothrombin Time: 17.8 seconds — ABNORMAL HIGH (ref 11.6–15.2)

## 2014-08-31 NOTE — Consult Note (Signed)
Name: Patrick Downs MRN: [id number] DOB: October 22, 1975    ADMISSION DATE:  08/26/2014 CONSULTATION DATE:  5/5  REFERRING MD :  [facility]  CHIEF COMPLAINT:  Resp failure  BRIEF PATIENT DESCRIPTION: 39 YO AAM on vent  SUBJECTIVE:   VITAL SIGNS: 97.1 Downs hr 96 19 173/111, 96% on vent   PHYSICAL EXAMINATION: General: Obese AAM Neuro:  rass 0 arouses Tracks and follows, follows commands HEENT:  Trach clean Cardiovascular:  HSR RR Lungs: coarse diffuse Abdomen:  Obese + bs . TF going Musculoskeletal:  Lower ext wasting Skin:  warm   Recent Labs Lab 08/28/14 1118 08/29/14 0453 08/30/14 0600  NA 143 139 140  K 4.1 4.2 4.2  CL 102 99* 100*  CO2 25 23 25   BUN 75* 79* 49*  CREATININE 12.10* 13.08* 9.96*  GLUCOSE 100* 93 87    Recent Labs Lab 08/28/14 1015 08/29/14 0453 08/30/14 0600  HGB 7.3* 7.5* 6.9*  HCT 23.1* 23.6* 22.6*  WBC 3.9* 3.7* 3.4*  PLT 157 150 147*   Dg Chest Port 1 View  08/30/2014   CLINICAL DATA:  Shortness of breath, respiratory distress  EXAM: PORTABLE CHEST - 1 VIEW  COMPARISON:  Portable exam 1659 hours compared to 08/27/2014  FINDINGS: Tracheostomy tube stable.  Tip of RIGHT jugular central venous catheter projects over RIGHT atrium unchanged.  Enlargement of cardiac silhouette.  Diffuse BILATERAL airspace infiltrates, slightly greater and increased in LEFT upper lobe.  No pleural effusion or pneumothorax.  Bones unremarkable.  IMPRESSION: BILATERAL pulmonary infiltrates increased in LEFT upper lobe since previous exam.  Tip of RIGHT jugular line projects over RIGHT atrium, unchanged; this can be withdrawn approximately 4 cm to position at the cavoatrial junction.   Electronically Signed   By: Lavonia Dana Patrick.D.   On: 08/30/2014 17:14    ASSESSMENT     Sepsis: currently on levophed at 10 mcg   CAP (community acquired pneumonia) Cultures at [facility] only grew Candidia   ESRD on dialysis. Patrick Downs. Note on pressors, ?? Should he be on CVVH?   A-V fistula rt fa  Patent    ARDS (adult respiratory distress syndrome) cxr  With BASDZ.    Septic shock   Hypotension   Acute respiratory failure, VDRF. Not weanable. With a history of Guillain Barre syndrome and administration of NMB at [facility].    Fever   Guillain Barr syndrome, lower ext weakness    Hx of parathyroidectomy  Discussion : Patrick Downs is a 17 AAM with ESRD, hx of Guillain-Barre syndrome with lower ext paresis, who presented to [facility] 08/05/14 with fever and body aches. He progressed to VDRF and failed extubation twice  Due to inability to generate significant tidal volumes plus lethargy. CxR cw ARDS pattern, volume overload, possible bilateral pna. He remains on pressor support and on HD 3 x a week. Seen by infectious disease a [facility] and was on Diflucan, Vanc, Meropenem with only + culture was Candida in sputum. Transferred to  [facility] 5/5 on 50% fio2 and 10 peep with rr 20. He is on levophed to maintain SBP> 90 via rt IJ cvl. PCCM asked to manage vent.  Acute respiratory failure due to weakness. Aspiration PNA: GBS Septic shock due to aspiration pneumonia. ESRD-HD:  Plan: Consider transfusion and recheck cbc Re assess coags with some bloody secretions Remains with sig infiltrates 550 20 40% peep 5, sedation is versed drip and fentanyl Add precedex to wean versed drip off and add fent patch  and int fent He is not weaning secondary to current sedation, so we need to transition to intermittent and precedex then PT active Is he a candidate for daily HD, will d/w renal Follow pcxr response  Once off versed and fent drips then attempt CPAP 5 PS 8-10  Ccm time 30 min   Updated family  Patrick Downs. Patrick Mould, MD, [place] Pgr: [facility]

## 2014-08-31 NOTE — Progress Notes (Signed)
[facility]  ROUNDING NOTE   Subjective:  Pt due for HD today.  Still quite ill. Has trach in place now. Pulm/cc has requested daily dialysis.  Objective:  97.1 96 19 173/111 General: critically ill Head:  [place]/AT OM moist Neck: tracheostomy in place now CVS: S1S2 no rubs Resp: bilateral rhonchi, vent assisted ABD: soft NTND BS present EXT: 2+ b/l upper and lower extremity edema Neuro: awake, not consistently following commands Access: Access: right arm AVG with fistular component.    Basic Metabolic Panel:  Recent Labs Lab 08/24/14 1500 08/27/14 0609 08/28/14 1118 08/29/14 0453 08/30/14 0600  NA 136 141 143 139 140  K 5.9* 4.5 4.1 4.2 4.2  CL 95* 99* 102 99* 100*  CO2 23 28 25 23 25   GLUCOSE 94 86 100* 93 87  BUN 97* 63* 75* 79* 49*  CREATININE 12.78* 10.09* 12.10* 13.08* 9.96*  CALCIUM 6.6* 7.5* 8.0* 8.1* 8.0*  PHOS 9.3*  --  9.6*  --  7.8*    Liver Function Tests:  Recent Labs Lab 08/24/14 1500 08/27/14 0609 08/28/14 1118 08/30/14 0600  AST  --  59*  --   --   ALT  --  32  --   --   ALKPHOS  --  53  --   --   BILITOT  --  1.0  --   --   PROT  --  5.6*  --   --   ALBUMIN 2.0* 1.8* 1.9* 2.1*   No results for input(s): LIPASE, AMYLASE in the last 168 hours. No results for input(s): AMMONIA in the last 168 hours.  CBC:  Recent Labs Lab 08/24/14 1500 08/24/14 2000 08/25/14 0501 08/27/14 0609 08/28/14 1015 08/29/14 0453 08/30/14 0600  WBC 7.3  --   --  6.2 3.9* 3.7* 3.4*  HGB 6.6* 8.0* 7.7* 7.6* 7.3* 7.5* 6.9*  HCT 20.4* 24.6*  --  24.3* 23.1* 23.6* 22.6*  MCV 90.9  --   --  94.2 92.4 90.4 92.6  PLT 187  --   --  161 157 150 147*     CBG:  Recent Labs Lab 08/26/14 0749  GLUCAP 93    Microbiology: Results for orders placed or performed during the hospital encounter of 08/05/14  Influenza A&B Antigens [facility])     Status: None   Collection Time: 08/05/14  5:49 PM  Result Value Ref Range Status   Micro Text Report   Final        COMMENT                   NEGATIVE FOR INFLUENZA A (ANTIGEN ABSENT)   COMMENT                   NEGATIVE FOR INFLUENZA B (ANTIGEN ABSENT)   ANTIBIOTIC                                                      Culture, blood (single)     Status: None   Collection Time: 08/05/14  6:26 PM  Result Value Ref Range Status   Micro Text Report   Final       COMMENT                   NO GROWTH AEROBICALLY/ANAEROBICALLY IN 5 DAYS  ANTIBIOTIC                                                      Culture, blood (single)     Status: None   Collection Time: 08/05/14  6:41 PM  Result Value Ref Range Status   Micro Text Report   Final       COMMENT                   NO GROWTH AEROBICALLY/ANAEROBICALLY IN 5 DAYS   ANTIBIOTIC                                                      Culture, blood (single)     Status: None   Collection Time: 08/05/14 10:15 PM  Result Value Ref Range Status   Micro Text Report   Final       COMMENT                   NO GROWTH AEROBICALLY/ANAEROBICALLY IN 5 DAYS   ANTIBIOTIC                                                      Culture, blood (single)     Status: None   Collection Time: 08/07/14 10:30 AM  Result Value Ref Range Status   Micro Text Report   Final       COMMENT                   NO GROWTH AEROBICALLY/ANAEROBICALLY IN 5 DAYS   ANTIBIOTIC                                                      Culture, blood (single)     Status: None   Collection Time: 08/07/14 10:43 AM  Result Value Ref Range Status   Micro Text Report   Final       COMMENT                   NO GROWTH AEROBICALLY/ANAEROBICALLY IN 5 DAYS   ANTIBIOTIC                                                      Culture, blood (single)     Status: None   Collection Time: 08/10/14  6:43 PM  Result Value Ref Range Status   Micro Text Report   Final       COMMENT                   NO GROWTH AEROBICALLY/ANAEROBICALLY IN 5 DAYS   ANTIBIOTIC  Culture, blood (single)     Status: None   Collection Time: 08/10/14  6:43 PM  Result Value Ref Range Status   Micro Text Report   Final       COMMENT                   NO GROWTH AEROBICALLY/ANAEROBICALLY IN 5 DAYS   ANTIBIOTIC                                                      Culture, blood (single)     Status: None   Collection Time: 08/13/14 12:40 AM  Result Value Ref Range Status   Micro Text Report   Final       COMMENT                   NO GROWTH AEROBICALLY/ANAEROBICALLY IN 5 DAYS   ANTIBIOTIC                                                      Culture, blood (single)     Status: None   Collection Time: 08/13/14 12:50 AM  Result Value Ref Range Status   Micro Text Report   Final       COMMENT                   NO GROWTH AEROBICALLY/ANAEROBICALLY IN 5 DAYS   ANTIBIOTIC                                                      Culture, fungus without smear     Status: None (Preliminary result)   Collection Time: 08/13/14  8:40 AM  Result Value Ref Range Status   Micro Text Report   Preliminary       SOURCE: BRONCH WASHING    ORGANISM 1                Candida albicans   COMMENT                   -   COMMENT                   -   ANTIBIOTIC                    ORG#1                                               Bronchial Wash Culture     Status: None   Collection Time: 08/13/14  8:40 AM  Result Value Ref Range Status   Micro Text Report   Final       ORGANISM 1                LIGHT GROWTH Candida albicans   COMMENT                   -  ANTIBIOTIC                    ORG#1                                               Influenza A,B,H1N1 - PCR [facility])     Status: None   Collection Time: 08/15/14  2:20 PM  Result Value Ref Range Status   Influenza A By PCR NEGATIVE NEGATIVE Final   Influenza B By PCR NEGATIVE NEGATIVE Final   H1N1 flu by pcr NOT DETECTED NOT-DETECTED Final    Comment:                  ----------------------- The Xpert Flu assay  (FDA approval for nasal aspirates or washes and nasopharyngeal swab specimens), is intended as an aid in the diagnosis of influenza and should not be used as a sole basis for treatment.   Culture, expectorated sputum-assessment     Status: None   Collection Time: 08/21/14 12:36 PM  Result Value Ref Range Status   Micro Text Report   Final       SOURCE: ETS    COMMENT                   APPEARS TO BE NORMAL FLORA AT 48 HOURS   GRAM STAIN                FEW WHITE BLOOD CELLS RARE GRAM NEGATIVE DIPLOCOCCI   GRAM STAIN                RARE GRAM POSITIVE COCCI   GRAM STAIN                GOOD SPECIMEN-80-90% WBC   GRAM STAIN                RARE GRAM NEGATIVE COCCO-BACILLI   ANTIBIOTIC                                                      Culture, blood (single)     Status: None (Preliminary result)   Collection Time: 08/21/14  3:31 PM  Result Value Ref Range Status   Micro Text Report   Preliminary       COMMENT                   NO GROWTH IN 36 HOURS   ANTIBIOTIC                                                      Culture, blood (single)     Status: None (Preliminary result)   Collection Time: 08/21/14  5:31 PM  Result Value Ref Range Status   Micro Text Report   Preliminary       COMMENT                   NO GROWTH IN 36 HOURS   ANTIBIOTIC  Culture, bal-quantitative     Status: None (Preliminary result)   Collection Time: 08/25/14  1:48 PM  Result Value Ref Range Status   Specimen Description BRONCHIAL ALVEOLAR LAVAGE  Final   Special Requests NONE  Final   Gram Stain   Final    GOOD SPECIMEN - 80-90% WBCS FEW WBC SEEN RARE GRAM POSITIVE COCCI RARE GRAM NEGATIVE COCCOBACILLI RARE GRAM NEGATIVE COCCI IN PAIRS    Colony Count PENDING  Incomplete   Culture APPEARS TO BE NORMAL FLORA AT 12 HOURS  Final   Report Status PENDING  Incomplete    Coagulation Studies: No results for input(s): LABPROT, INR in the last 72  hours.  Urinalysis: No results for input(s): COLORURINE, LABSPEC, PHURINE, GLUCOSEU, HGBUR, BILIRUBINUR, KETONESUR, PROTEINUR, UROBILINOGEN, NITRITE, LEUKOCYTESUR in the last 72 hours.  Invalid input(s): APPERANCEUR    Imaging: Dg Chest Port 1 View  08/30/2014   CLINICAL DATA:  Shortness of breath, respiratory distress  EXAM: PORTABLE CHEST - 1 VIEW  COMPARISON:  Portable exam 1659 hours compared to 08/27/2014  FINDINGS: Tracheostomy tube stable.  Tip of RIGHT jugular central venous catheter projects over RIGHT atrium unchanged.  Enlargement of cardiac silhouette.  Diffuse BILATERAL airspace infiltrates, slightly greater and increased in LEFT upper lobe.  No pleural effusion or pneumothorax.  Bones unremarkable.  IMPRESSION: BILATERAL pulmonary infiltrates increased in LEFT upper lobe since previous exam.  Tip of RIGHT jugular line projects over RIGHT atrium, unchanged; this can be withdrawn approximately 4 cm to position at the cavoatrial junction.   Electronically Signed   By: Lavonia Dana M.D.   On: 08/30/2014 17:14     Medications:       Assessment/ Plan:  39 y.o. Black male with End Stage Renal Disease on MWF schedule with right arm AVF/AVG followed by [facility] [address], hypertension, hyperlipidemia, Guillain-Barre syndrome, admission for fever 08/05/14  1. ESRD: MWF HD: N18.6 [place]. [facility]. - Pt due for HD today, case discussed with pulm/cc, they have requested daily dialysis, will set target UF of 2.5kg daily for now.   2. Acute respiratory failure with pneumonia: J96.00, J15.9. S/P tracheostomy. -doing well post tracheostomy placement, still on vent, performing HD for volume removal per pulm/cc.  3. Anemia of Chronic Kidney Disease: D63.1  hgb down to 6.9, start aranesp 162mcg [place] q week.  4. Secondary Hyperparathyroidism:N25.81 -phos still high at 7.8, when able to take PO will consider binders.   LOS:  Matix Henshaw 5/9/20162:34 PM

## 2014-09-01 ENCOUNTER — Encounter: Payer: Self-pay | Admitting: Radiology

## 2014-09-01 ENCOUNTER — Other Ambulatory Visit (HOSPITAL_COMMUNITY): Payer: Self-pay

## 2014-09-01 DIAGNOSIS — Z0189 Encounter for other specified special examinations: Secondary | ICD-10-CM | POA: Diagnosis not present

## 2014-09-01 DIAGNOSIS — J189 Pneumonia, unspecified organism: Secondary | ICD-10-CM | POA: Diagnosis not present

## 2014-09-01 DIAGNOSIS — J9600 Acute respiratory failure, unspecified whether with hypoxia or hypercapnia: Secondary | ICD-10-CM | POA: Diagnosis not present

## 2014-09-01 DIAGNOSIS — J80 Acute respiratory distress syndrome: Secondary | ICD-10-CM | POA: Diagnosis not present

## 2014-09-01 LAB — RENAL FUNCTION PANEL
Albumin: 2.1 g/dL — ABNORMAL LOW (ref 3.5–5.0)
Anion gap: 19 — ABNORMAL HIGH (ref 5–15)
BUN: 66 mg/dL — ABNORMAL HIGH (ref 6–20)
CO2: 19 mmol/L — AB (ref 22–32)
Calcium: 7.9 mg/dL — ABNORMAL LOW (ref 8.9–10.3)
Chloride: 100 mmol/L — ABNORMAL LOW (ref 101–111)
Creatinine, Ser: 12.86 mg/dL — ABNORMAL HIGH (ref 0.61–1.24)
GFR calc non Af Amer: 4 mL/min — ABNORMAL LOW (ref >60)
GFR, EST AFRICAN AMERICAN: 5 mL/min — AB (ref >60)
Glucose, Bld: 77 mg/dL (ref 70–99)
PHOSPHORUS: 9.4 mg/dL — AB (ref 2.5–4.6)
POTASSIUM: 4.4 mmol/L (ref 3.5–5.1)
SODIUM: 138 mmol/L (ref 135–145)

## 2014-09-01 LAB — CBC
HCT: 22.1 % — ABNORMAL LOW (ref 39.0–52.0)
HEMOGLOBIN: 7 g/dL — AB (ref 13.0–17.0)
MCH: 28.3 pg (ref 26.0–34.0)
MCHC: 31.7 g/dL (ref 30.0–36.0)
MCV: 89.5 fL (ref 78.0–100.0)
Platelets: 135 10*3/uL — ABNORMAL LOW (ref 150–400)
RBC: 2.47 MIL/uL — AB (ref 4.22–5.81)
RDW: 15.4 % (ref 11.5–15.5)
WBC: 3.4 10*3/uL — AB (ref 4.0–10.5)

## 2014-09-01 NOTE — Progress Notes (Signed)
Reason for Consult:Dysphagia/PCM    HPI: Patrick Downs is an 39 y.o. male with resp failure, dysphagia and PCM. He has tracheostomy and requires G-tube placement for long term nutrition. Chart,PMHx, meds, labs, imaging reviewed.  Past Medical History:  Past Medical History  Diagnosis Date  . ESRD (end stage renal disease) on dialysis   . Pneumonia   . Respiratory failure   . Failure to thrive in adult   . Guillain Barr syndrome     Surgical History: No prior abdominal surgery  Family History: No family history on file.  Social History:  has no tobacco, alcohol, and drug history on file.  Allergies:  Allergies  Allergen Reactions  . Ivp Dye [Iodinated Diagnostic Agents] Other (See Comments)    Reaction unknown  . Omnipaque [Iohexol] Other (See Comments)    Reaction unknown  . Minoxidil Other (See Comments)    Reaction unknown  . Morphine And Related Other (See Comments)    Reaction unknown    Medications: See med list, no anticoagulation  ROS: See HPI for pertinent findings, otherwise complete 10 system review negative.  Physical Exam: Temp: 98.5, BP: 180/112,HR: 90 O2: 98% General: AA male, sedation on vent ENT: tracheostomy present Lungs: BS equal Heart: Regualr Abd: soft, NT, no scars    Labs: Results for orders placed or performed during the hospital encounter of 08/26/14 (from the past 48 hour(s))  Prepare RBC     Status: None   Collection Time: 08/31/14 12:20 AM  Result Value Ref Range   Order Confirmation ORDER PROCESSED BY BLOOD BANK   Vancomycin, random     Status: None   Collection Time: 08/31/14  2:00 AM  Result Value Ref Range   Vancomycin Rm 31 ug/mL    Comment:        Random Vancomycin therapeutic range is dependent on dosage and time of specimen collection. A peak range is 20.0-40.0 ug/mL A trough range is 5.0-15.0 ug/mL          Type and screen     Status: None (Preliminary result)   Collection Time: 08/31/14  2:00 AM  Result  Value Ref Range   ABO/RH(D) O POS    Antibody Screen NEG    Sample Expiration 09/03/2014    Unit Number [id number]    Blood Component Type RED CELLS,LR    Unit division 00    Status of Unit ISSUED    Transfusion Status OK TO TRANSFUSE    Crossmatch Result Compatible    Unit Number [id number]    Blood Component Type RED CELLS,LR    Unit division 00    Status of Unit ALLOCATED    Transfusion Status OK TO TRANSFUSE    Crossmatch Result Compatible   ABO/Rh     Status: None   Collection Time: 08/31/14  2:00 AM  Result Value Ref Range   ABO/RH(D) O POS   Protime-INR     Status: Abnormal   Collection Time: 08/31/14  5:49 PM  Result Value Ref Range   Prothrombin Time 17.8 (H) 11.6 - 15.2 seconds   INR 1.46 0.00 - 1.49  APTT     Status: Abnormal   Collection Time: 08/31/14  5:49 PM  Result Value Ref Range   aPTT 52 (H) 24 - 37 seconds    Comment:        IF BASELINE aPTT IS ELEVATED, SUGGEST PATIENT RISK ASSESSMENT BE USED TO DETERMINE APPROPRIATE ANTICOAGULANT THERAPY.   CBC     Status:  Abnormal   Collection Time: 09/01/14  6:55 AM  Result Value Ref Range   WBC 3.4 (L) 4.0 - 10.5 K/uL   RBC 2.47 (L) 4.22 - 5.81 MIL/uL   Hemoglobin 7.0 (L) 13.0 - 17.0 g/dL   HCT 22.1 (L) 39.0 - 52.0 %   MCV 89.5 78.0 - 100.0 fL   MCH 28.3 26.0 - 34.0 pg   MCHC 31.7 30.0 - 36.0 g/dL   RDW 15.4 11.5 - 15.5 %   Platelets 135 (L) 150 - 400 K/uL  Renal function panel     Status: Abnormal   Collection Time: 09/01/14  6:55 AM  Result Value Ref Range   Sodium 138 135 - 145 mmol/L   Potassium 4.4 3.5 - 5.1 mmol/L   Chloride 100 (L) 101 - 111 mmol/L   CO2 19 (L) 22 - 32 mmol/L   Glucose, Bld 77 70 - 99 mg/dL   BUN 66 (H) 6 - 20 mg/dL   Creatinine, Ser 12.86 (H) 0.61 - 1.24 mg/dL   Calcium 7.9 (L) 8.9 - 10.3 mg/dL   Phosphorus 9.4 (H) 2.5 - 4.6 mg/dL   Albumin 2.1 (L) 3.5 - 5.0 g/dL   GFR calc non Af Amer 4 (L) >60 mL/min   GFR calc Af Amer 5 (L) >60 mL/min    Comment: (NOTE) The eGFR  has been calculated using the CKD EPI equation. This calculation has not been validated in all clinical situations. eGFR's persistently <60 mL/min signify possible Chronic Kidney Disease.    Anion gap 19 (H) 5 - 15    PT/INR  Recent Labs  08/31/14 1749  LABPROT 17.8*  INR 1.46       Dg Chest Port 1 View  08/30/2014   CLINICAL DATA:  Shortness of breath, respiratory distress  EXAM: PORTABLE CHEST - 1 VIEW  COMPARISON:  Portable exam 1659 hours compared to 08/27/2014  FINDINGS: Tracheostomy tube stable.  Tip of RIGHT jugular central venous catheter projects over RIGHT atrium unchanged.  Enlargement of cardiac silhouette.  Diffuse BILATERAL airspace infiltrates, slightly greater and increased in LEFT upper lobe.  No pleural effusion or pneumothorax.  Bones unremarkable.  IMPRESSION: BILATERAL pulmonary infiltrates increased in LEFT upper lobe since previous exam.  Tip of RIGHT jugular line projects over RIGHT atrium, unchanged; this can be withdrawn approximately 4 cm to position at the cavoatrial junction.   Electronically Signed   By: Lavonia Dana M.D.   On: 08/30/2014 17:14    Assessment/Plan: FTT/PCM secondary to sepsis and resp failure. Imaging reviewed, has accessible percutaneous window for perc g-tube Procedure, risks,complications, discussed with parents at bedside. Plan procedure for tomorrow.   Ascencion Dike PA-C 09/01/2014, 1:57 PM

## 2014-09-01 NOTE — Consult Note (Signed)
   Name: Patrick Downs MRN: [id number] DOB: [birth year]    ADMISSION DATE:  08/26/2014 CONSULTATION DATE:  5/5  REFERRING MD :  [facility]  CHIEF COMPLAINT:  Resp failure  BRIEF PATIENT DESCRIPTION: 39 YO AAM on vent  SUBJECTIVE:   VITAL SIGNS: 97 f, 90, 18-120, 95%, 172/103   PHYSICAL EXAMINATION: General: Obese AAM, more awake Neuro:  rass 0, appears more organized, nonfocal, calmer off continuous sedation HEENT:  Trach clean Cardiovascular:  HSR RR Lungs: coarse diffuse - mild crackles Abdomen:  Obese + bs . TF going Musculoskeletal:  Low muscle mass Skin:  No rash   Recent Labs Lab 08/29/14 0453 08/30/14 0600 09/01/14 0655  NA 139 140 138  K 4.2 4.2 4.4  CL 99* 100* 100*  CO2 23 25 19*  BUN 79* 49* 66*  CREATININE 13.08* 9.96* 12.86*  GLUCOSE 93 87 77    Recent Labs Lab 08/29/14 0453 08/30/14 0600 09/01/14 0655  HGB 7.5* 6.9* 7.0*  HCT 23.6* 22.6* 22.1*  WBC 3.7* 3.4* 3.4*  PLT 150 147* 135*   Dg Chest Port 1 View  08/30/2014   CLINICAL DATA:  Shortness of breath, respiratory distress  EXAM: PORTABLE CHEST - 1 VIEW  COMPARISON:  Portable exam 1659 hours compared to 08/27/2014  FINDINGS: Tracheostomy tube stable.  Tip of RIGHT jugular central venous catheter projects over RIGHT atrium unchanged.  Enlargement of cardiac silhouette.  Diffuse BILATERAL airspace infiltrates, slightly greater and increased in LEFT upper lobe.  No pleural effusion or pneumothorax.  Bones unremarkable.  IMPRESSION: BILATERAL pulmonary infiltrates increased in LEFT upper lobe since previous exam.  Tip of RIGHT jugular line projects over RIGHT atrium, unchanged; this can be withdrawn approximately 4 cm to position at the cavoatrial junction.   Electronically Signed   By: Lavonia Dana M.D.   On: 08/30/2014 17:14    ASSESSMENT     Sepsis: currently on levophed at 10 mcg   CAP (community acquired pneumonia) Cultures at [facility] only grew Candidia   ESRD on dialysis. [facility]. Note on pressors, ??  Should he be on CVVH?   A-V fistula rt fa Patent    ARDS (adult respiratory distress syndrome) cxr  With BASDZ.    Septic shock   Hypotension   Acute respiratory failure, VDRF. Not weanable. With a history of Guillain Barre syndrome and administration of NMB at [facility].    Fever   Guillain Barr syndrome, lower ext weakness    Hx of parathyroidectomy  Acute respiratory failure due to weakness. Aspiration PNA: GBS Septic shock due to aspiration pneumonia - resolved ESRD-HD:  Plan: Much improved status on precedex and we are successfully off fent / versed drip Continues ativan to avoid WD benzo Continued fent patch precedex can max to 2.5 if needed Increase rispirdal in hopes to wean precedex, may need haldol addition also, if so would check QTC Wean now as off continuous sedation versed/ fent, goal ps 10 cpap 5' Appreciate renal efforts for daily HD, he remains HTN, re evaluate oafter hd performed again, may need further HTN agent s for control For PRBC per Primary, would NOT transfuse unless hgb less 7 Assess NIF, VC also with h/o GBS  Ccm time 30 min    Lavon Paganini. Titus Mould, MD, [place] Pgr: [place] [facility]

## 2014-09-02 ENCOUNTER — Other Ambulatory Visit (HOSPITAL_COMMUNITY): Payer: Self-pay

## 2014-09-02 DIAGNOSIS — I770 Arteriovenous fistula, acquired: Secondary | ICD-10-CM | POA: Diagnosis not present

## 2014-09-02 DIAGNOSIS — J9600 Acute respiratory failure, unspecified whether with hypoxia or hypercapnia: Secondary | ICD-10-CM | POA: Diagnosis not present

## 2014-09-02 DIAGNOSIS — J189 Pneumonia, unspecified organism: Secondary | ICD-10-CM | POA: Diagnosis not present

## 2014-09-02 DIAGNOSIS — J80 Acute respiratory distress syndrome: Secondary | ICD-10-CM | POA: Diagnosis not present

## 2014-09-02 LAB — BLOOD GAS, ARTERIAL
ACID-BASE DEFICIT: 6.1 mmol/L — AB (ref 0.0–2.0)
Acid-base deficit: 4.3 mmol/L — ABNORMAL HIGH (ref 0.0–2.0)
Acid-base deficit: 2.1 mmol/L — ABNORMAL HIGH (ref 0.0–2.0)
BICARBONATE: 19.9 meq/L — AB (ref 20.0–24.0)
BICARBONATE: 21.4 meq/L (ref 20.0–24.0)
Bicarbonate: 21 mEq/L (ref 20.0–24.0)
FIO2: 1 %
FIO2: 1 %
FIO2: 1 %
LHR: 20 {breaths}/min
MECHVT: 550 mL
MECHVT: 550 mL
O2 SAT: 91 %
O2 SAT: 100 %
O2 Saturation: 92.4 %
PATIENT TEMPERATURE: 98.6
PCO2 ART: 43.8 mmHg (ref 35.0–45.0)
PCO2 ART: 32.6 mmHg — AB (ref 35.0–45.0)
PEEP/CPAP: 5 cmH2O
PEEP: 12 cmH2O
PEEP: 12 cmH2O
PO2 ART: 80.8 mmHg (ref 80.0–100.0)
PO2 ART: 73.1 mmHg — AB (ref 80.0–100.0)
Patient temperature: 98.6
Patient temperature: 99.4
RATE: 25 resp/min
RATE: 25 resp/min
TCO2: 21.3 mmol/L (ref 0–100)
TCO2: 22.4 mmol/L (ref 0–100)
TCO2: 22.4 mmol/L (ref 0–100)
VT: 550 mL
pCO2 arterial: 47.1 mmHg — ABNORMAL HIGH (ref 35.0–45.0)
pH, Arterial: 7.249 — ABNORMAL LOW (ref 7.350–7.450)
pH, Arterial: 7.303 — ABNORMAL LOW (ref 7.350–7.450)
pH, Arterial: 7.435 (ref 7.350–7.450)
pO2, Arterial: 321 mmHg — ABNORMAL HIGH (ref 80.0–100.0)

## 2014-09-02 LAB — RENAL FUNCTION PANEL
ALBUMIN: 2.2 g/dL — AB (ref 3.5–5.0)
ANION GAP: 17 — AB (ref 5–15)
BUN: 45 mg/dL — ABNORMAL HIGH (ref 6–20)
CO2: 21 mmol/L — ABNORMAL LOW (ref 22–32)
Calcium: 8.2 mg/dL — ABNORMAL LOW (ref 8.9–10.3)
Chloride: 101 mmol/L (ref 101–111)
Creatinine, Ser: 10.31 mg/dL — ABNORMAL HIGH (ref 0.61–1.24)
GFR, EST AFRICAN AMERICAN: 7 mL/min — AB (ref >60)
GFR, EST NON AFRICAN AMERICAN: 6 mL/min — AB (ref >60)
Glucose, Bld: 107 mg/dL — ABNORMAL HIGH (ref 70–99)
POTASSIUM: 4.3 mmol/L (ref 3.5–5.1)
Phosphorus: 7.4 mg/dL — ABNORMAL HIGH (ref 2.5–4.6)
SODIUM: 139 mmol/L (ref 135–145)

## 2014-09-02 LAB — CBC
HCT: 25.9 % — ABNORMAL LOW (ref 39.0–52.0)
HEMOGLOBIN: 8.3 g/dL — AB (ref 13.0–17.0)
MCH: 28.6 pg (ref 26.0–34.0)
MCHC: 32 g/dL (ref 30.0–36.0)
MCV: 89.3 fL (ref 78.0–100.0)
Platelets: 183 10*3/uL (ref 150–400)
RBC: 2.9 MIL/uL — ABNORMAL LOW (ref 4.22–5.81)
RDW: 15.1 % (ref 11.5–15.5)
WBC: 9.4 10*3/uL (ref 4.0–10.5)

## 2014-09-02 LAB — D-DIMER, QUANTITATIVE (NOT AT ARMC): D-Dimer, Quant: 12.37 ug/mL-FEU — ABNORMAL HIGH (ref 0.00–0.48)

## 2014-09-02 LAB — VANCOMYCIN, RANDOM: VANCOMYCIN RM: 22 ug/mL

## 2014-09-02 NOTE — Progress Notes (Signed)
[facility]  ROUNDING NOTE   Subjective:  Seen on hemodialysis. Tolerating treatment well. UF goal of 3.5. Daily dialysis with UF of 2 litres yesterday.  1 unit PRBC yesterday.    Objective:  T 98.6 p 107 bp 136/89 General: critically ill Head:  Hazard/AT OM moist Neck: tracheostomy  CVS: S1S2 no rubs Resp: bilateral rhonchi, vent assisted: A/C FiO2 100% ABD: soft NTND BS present EXT: 2+ b/l upper and lower extremity edema Neuro: awake, not consistently following commands Access: Access: right arm AVG with fistular component.    Basic Metabolic Panel:  Recent Labs Lab 08/28/14 1118 08/29/14 0453 08/30/14 0600 09/01/14 0655 09/02/14 0727  NA 143 139 140 138 139  K 4.1 4.2 4.2 4.4 4.3  CL 102 99* 100* 100* 101  CO2 25 23 25  19* 21*  GLUCOSE 100* 93 87 77 107*  BUN 75* 79* 49* 66* 45*  CREATININE 12.10* 13.08* 9.96* 12.86* 10.31*  CALCIUM 8.0* 8.1* 8.0* 7.9* 8.2*  PHOS 9.6*  --  7.8* 9.4* 7.4*    Liver Function Tests:  Recent Labs Lab 08/27/14 0609 08/28/14 1118 08/30/14 0600 09/01/14 0655 09/02/14 0727  AST 59*  --   --   --   --   ALT 32  --   --   --   --   ALKPHOS 53  --   --   --   --   BILITOT 1.0  --   --   --   --   PROT 5.6*  --   --   --   --   ALBUMIN 1.8* 1.9* 2.1* 2.1* 2.2*   No results for input(s): LIPASE, AMYLASE in the last 168 hours. No results for input(s): AMMONIA in the last 168 hours.  CBC:  Recent Labs Lab 08/28/14 1015 08/29/14 0453 08/30/14 0600 09/01/14 0655 09/02/14 0728  WBC 3.9* 3.7* 3.4* 3.4* 9.4  HGB 7.3* 7.5* 6.9* 7.0* 8.3*  HCT 23.1* 23.6* 22.6* 22.1* 25.9*  MCV 92.4 90.4 92.6 89.5 89.3  PLT 157 150 147* 135* 183     CBG: No results for input(s): GLUCAP in the last 168 hours.  Microbiology: Results for orders placed or performed during the hospital encounter of 08/05/14  Influenza A&B Antigens [facility])     Status: None   Collection Time: 08/05/14  5:49 PM  Result Value Ref Range Status   Micro  Text Report   Final       COMMENT                   NEGATIVE FOR INFLUENZA A (ANTIGEN ABSENT)   COMMENT                   NEGATIVE FOR INFLUENZA B (ANTIGEN ABSENT)   ANTIBIOTIC                                                      Culture, blood (single)     Status: None   Collection Time: 08/05/14  6:26 PM  Result Value Ref Range Status   Micro Text Report   Final       COMMENT                   NO GROWTH AEROBICALLY/ANAEROBICALLY IN 5 DAYS   ANTIBIOTIC  Culture, blood (single)     Status: None   Collection Time: 08/05/14  6:41 PM  Result Value Ref Range Status   Micro Text Report   Final       COMMENT                   NO GROWTH AEROBICALLY/ANAEROBICALLY IN 5 DAYS   ANTIBIOTIC                                                      Culture, blood (single)     Status: None   Collection Time: 08/05/14 10:15 PM  Result Value Ref Range Status   Micro Text Report   Final       COMMENT                   NO GROWTH AEROBICALLY/ANAEROBICALLY IN 5 DAYS   ANTIBIOTIC                                                      Culture, blood (single)     Status: None   Collection Time: 08/07/14 10:30 AM  Result Value Ref Range Status   Micro Text Report   Final       COMMENT                   NO GROWTH AEROBICALLY/ANAEROBICALLY IN 5 DAYS   ANTIBIOTIC                                                      Culture, blood (single)     Status: None   Collection Time: 08/07/14 10:43 AM  Result Value Ref Range Status   Micro Text Report   Final       COMMENT                   NO GROWTH AEROBICALLY/ANAEROBICALLY IN 5 DAYS   ANTIBIOTIC                                                      Culture, blood (single)     Status: None   Collection Time: 08/10/14  6:43 PM  Result Value Ref Range Status   Micro Text Report   Final       COMMENT                   NO GROWTH AEROBICALLY/ANAEROBICALLY IN 5 DAYS   ANTIBIOTIC                                                       Culture, blood (single)     Status: None   Collection  Time: 08/10/14  6:43 PM  Result Value Ref Range Status   Micro Text Report   Final       COMMENT                   NO GROWTH AEROBICALLY/ANAEROBICALLY IN 5 DAYS   ANTIBIOTIC                                                      Culture, blood (single)     Status: None   Collection Time: 08/13/14 12:40 AM  Result Value Ref Range Status   Micro Text Report   Final       COMMENT                   NO GROWTH AEROBICALLY/ANAEROBICALLY IN 5 DAYS   ANTIBIOTIC                                                      Culture, blood (single)     Status: None   Collection Time: 08/13/14 12:50 AM  Result Value Ref Range Status   Micro Text Report   Final       COMMENT                   NO GROWTH AEROBICALLY/ANAEROBICALLY IN 5 DAYS   ANTIBIOTIC                                                      Culture, fungus without smear     Status: None (Preliminary result)   Collection Time: 08/13/14  8:40 AM  Result Value Ref Range Status   Micro Text Report   Preliminary       SOURCE: BRONCH WASHING    ORGANISM 1                Candida albicans   COMMENT                   -   COMMENT                   -   ANTIBIOTIC                    ORG#1                                               Bronchial Wash Culture     Status: None   Collection Time: 08/13/14  8:40 AM  Result Value Ref Range Status   Micro Text Report   Final       ORGANISM 1                LIGHT GROWTH Candida albicans   COMMENT                   -  ANTIBIOTIC                    ORG#1                                               Influenza A,B,H1N1 - PCR [facility])     Status: None   Collection Time: 08/15/14  2:20 PM  Result Value Ref Range Status   Influenza A By PCR NEGATIVE NEGATIVE Final   Influenza B By PCR NEGATIVE NEGATIVE Final   H1N1 flu by pcr NOT DETECTED NOT-DETECTED Final    Comment:                   ----------------------- The Xpert Flu assay (FDA approval for nasal aspirates or washes and nasopharyngeal swab specimens), is intended as an aid in the diagnosis of influenza and should not be used as a sole basis for treatment.   Culture, expectorated sputum-assessment     Status: None   Collection Time: 08/21/14 12:36 PM  Result Value Ref Range Status   Micro Text Report   Final       SOURCE: ETS    COMMENT                   APPEARS TO BE NORMAL FLORA AT 48 HOURS   GRAM STAIN                FEW WHITE BLOOD CELLS RARE GRAM NEGATIVE DIPLOCOCCI   GRAM STAIN                RARE GRAM POSITIVE COCCI   GRAM STAIN                GOOD SPECIMEN-80-90% WBC   GRAM STAIN                RARE GRAM NEGATIVE COCCO-BACILLI   ANTIBIOTIC                                                      Culture, blood (single)     Status: None (Preliminary result)   Collection Time: 08/21/14  3:31 PM  Result Value Ref Range Status   Micro Text Report   Preliminary       COMMENT                   NO GROWTH IN 36 HOURS   ANTIBIOTIC                                                      Culture, blood (single)     Status: None (Preliminary result)   Collection Time: 08/21/14  5:31 PM  Result Value Ref Range Status   Micro Text Report   Preliminary       COMMENT                   NO GROWTH IN 36 HOURS   ANTIBIOTIC  Culture, bal-quantitative     Status: None (Preliminary result)   Collection Time: 08/25/14  1:48 PM  Result Value Ref Range Status   Specimen Description BRONCHIAL ALVEOLAR LAVAGE  Final   Special Requests NONE  Final   Gram Stain   Final    GOOD SPECIMEN - 80-90% WBCS FEW WBC SEEN RARE GRAM POSITIVE COCCI RARE GRAM NEGATIVE COCCOBACILLI RARE GRAM NEGATIVE COCCI IN PAIRS    Colony Count PENDING  Incomplete   Culture APPEARS TO BE NORMAL FLORA AT 70 HOURS  Final   Report Status PENDING  Incomplete    Coagulation Studies:  Recent  Labs  08/31/14 1749  LABPROT 17.8*  INR 1.46    Urinalysis: No results for input(s): COLORURINE, LABSPEC, PHURINE, GLUCOSEU, HGBUR, BILIRUBINUR, KETONESUR, PROTEINUR, UROBILINOGEN, NITRITE, LEUKOCYTESUR in the last 72 hours.  Invalid input(s): APPERANCEUR    Imaging: Dg Chest Port 1 View  09/02/2014   CLINICAL DATA:  Acute respiratory failure.  EXAM: PORTABLE CHEST - 1 VIEW  COMPARISON:  08/30/2014  FINDINGS: Tracheostomy. Enteric tube with tip in the left upper quadrant consistent with location in the stomach. Shallow inspiration. Diffuse bilateral pulmonary infiltrates likely representing edema, ARDS, or pneumonia. Infiltrates are increasing since previous study. Difficult to visualize heart size and pulmonary vascularity through the infiltrates but heart size appears normal. No pleural effusions. No pneumothorax.  IMPRESSION: Increasing bilateral airspace infiltration in the lungs.   Electronically Signed   By: Lucienne Capers M.D.   On: 09/02/2014 06:38   Dg Abd Portable 1v  09/01/2014   CLINICAL DATA:  Nasogastric tube placement  EXAM: Portable exam 1700 hours compared to 08/26/2014  COMPARISON:  None.  FINDINGS: Tip of nasogastric tube projects over mid stomach.  Bowel gas pattern normal.  Osseous structures unremarkable.  IMPRESSION: Nasogastric tube tip projects over mid stomach.   Electronically Signed   By: Lavonia Dana M.D.   On: 09/01/2014 17:10     Medications:    As per list.    Assessment/ Plan:  39 y.o. Black male with End Stage Renal Disease on MWF schedule with right arm AVF/AVG followed by [facility] [address], hypertension, hyperlipidemia, Guillain-Barre syndrome, admission for fever 08/05/14  1. ESRD: MWF HD: N18.6 [place]. [facility]. - Patient seen on hemodialysis today. Tolerating treatment well. Daily dialysis to help with volume removal and respiratory status.  - Plan for dialysis again tomorrow and Friday. UF for 3.5 kg tomorrow. Evaluate  for UF on Friday.   2. Acute respiratory failure with pneumonia: J96.00, J15.9. S/P tracheostomy. -doing well post tracheostomy placement, still on vent, appreciate pulmonary input.  - continue weaning.  - Cefepim and fluconazole.   3. Anemia of Chronic Kidney Disease: D63.1  -  aranesp 14mcg Se[place] q week started this week.  - status post 1 unit transfusion PRBC on 5/10.   4. Secondary Hyperparathyroidism:N25.81 -phos still high at 7.4, when able to take PO will consider binders. Currently on Tube feeds.    LOS:  Lavonia Dana 5/11/201610:42 AM

## 2014-09-02 NOTE — Consult Note (Signed)
Name: Patrick Downs MRN: [id number] DOB: [birth year]    ADMISSION DATE:  08/26/2014 CONSULTATION DATE:  5/5  REFERRING MD :  [facility]  CHIEF COMPLAINT:  Resp failure  BRIEF PATIENT DESCRIPTION: 38 YO AAM on vent  SUBJECTIVE:   VITAL SIGNS: [id number] F rr 25 179.95 90% sat on 100%   PHYSICAL EXAMINATION: General: awakens, some dyschrony Neuro:  rass -1, awakens and follows commands, perrl HEENT:  Trach clean Cardiovascular:  S1 s2 rrt Lungs: diffuse insp crackles Abdomen:  Obese + bs . TF going Musculoskeletal:  Low muscle mass, min movement Skin:  No rash   Recent Labs Lab 08/29/14 0453 08/30/14 0600 09/01/14 0655  NA 139 140 138  K 4.2 4.2 4.4  CL 99* 100* 100*  CO2 23 25 19*  BUN 79* 49* 66*  CREATININE 13.08* 9.96* 12.86*  GLUCOSE 93 87 77    Recent Labs Lab 08/30/14 0600 09/01/14 0655 09/02/14 0728  HGB 6.9* 7.0* 8.3*  HCT 22.6* 22.1* 25.9*  WBC 3.4* 3.4* 9.4  PLT 147* 135* 183   Dg Chest Port 1 View  09/02/2014   CLINICAL DATA:  Acute respiratory failure.  EXAM: PORTABLE CHEST - 1 VIEW  COMPARISON:  08/30/2014  FINDINGS: Tracheostomy. Enteric tube with tip in the left upper quadrant consistent with location in the stomach. Shallow inspiration. Diffuse bilateral pulmonary infiltrates likely representing edema, ARDS, or pneumonia. Infiltrates are increasing since previous study. Difficult to visualize heart size and pulmonary vascularity through the infiltrates but heart size appears normal. No pleural effusions. No pneumothorax.  IMPRESSION: Increasing bilateral airspace infiltration in the lungs.   Electronically Signed   By: Lucienne Capers M.D.   On: 09/02/2014 06:38   Dg Abd Portable 1v  09/01/2014   CLINICAL DATA:  Nasogastric tube placement  EXAM: Portable exam 1700 hours compared to 08/26/2014  COMPARISON:  None.  FINDINGS: Tip of nasogastric tube projects over mid stomach.  Bowel gas pattern normal.  Osseous structures unremarkable.  IMPRESSION:  Nasogastric tube tip projects over mid stomach.   Electronically Signed   By: Lavonia Dana M.D.   On: 09/01/2014 17:10    ASSESSMENT     Sepsis: currently on levophed at 10 mcg   CAP (community acquired pneumonia) Cultures at [facility] only grew Candidia   ESRD on dialysis. M W F. Note on pressors, ?? Should he be on CVVH?   A-V fistula rt fa Patent    ARDS (adult respiratory distress syndrome) cxr  With BASDZ.    Septic shock   Hypotension   Acute respiratory failure, VDRF. Not weanable. With a history of Guillain Barre syndrome and administration of NMB at [facility].    Fever   Guillain Barr syndrome, lower ext weakness    Hx of parathyroidectomy  Acute respiratory failure due to weakness. Aspiration PNA: GBS Septic shock due to aspiration pneumonia - resolved ESRD-HD 5/11 Acute worsening pulm edema / HTN crisis  Plan: pcxr c/w edema diffuse acute Lower MAP by 30% highest MAP noted Prop started, dc precedex, re evaluate BP May need labetalol May consider NTG if MAP remains high to reduce preload if emergent HD not effective D/w renal, consider to remove over 3-4 liters ABG reviewd, repeat STAT Peep to 12 ecg May need fent drip and rass deep then paralysis if dyschrony not improved Repeat pcxr this afternoon Repeat abg  Ccm time 40 min    Lavon Paganini. Titus Mould, MD, [place] Pgr: [facility]

## 2014-09-03 ENCOUNTER — Other Ambulatory Visit (HOSPITAL_COMMUNITY): Payer: Self-pay

## 2014-09-03 DIAGNOSIS — I770 Arteriovenous fistula, acquired: Secondary | ICD-10-CM | POA: Diagnosis not present

## 2014-09-03 DIAGNOSIS — J9600 Acute respiratory failure, unspecified whether with hypoxia or hypercapnia: Secondary | ICD-10-CM | POA: Diagnosis not present

## 2014-09-03 LAB — BLOOD GAS, ARTERIAL
ACID-BASE DEFICIT: 3.2 mmol/L — AB (ref 0.0–2.0)
Bicarbonate: 21.2 mEq/L (ref 20.0–24.0)
FIO2: 0.6 %
MECHVT: 550 mL
O2 Saturation: 99.3 %
PCO2 ART: 37.4 mmHg (ref 35.0–45.0)
PEEP: 10 cmH2O
Patient temperature: 98.6
RATE: 20 resp/min
TCO2: 22.3 mmol/L (ref 0–100)
pH, Arterial: 7.372 (ref 7.350–7.450)
pO2, Arterial: 185 mmHg — ABNORMAL HIGH (ref 80.0–100.0)

## 2014-09-03 LAB — RENAL FUNCTION PANEL
ALBUMIN: 2.3 g/dL — AB (ref 3.5–5.0)
Anion gap: 15 (ref 5–15)
BUN: 43 mg/dL — ABNORMAL HIGH (ref 6–20)
CALCIUM: 8.3 mg/dL — AB (ref 8.9–10.3)
CO2: 22 mmol/L (ref 22–32)
Chloride: 101 mmol/L (ref 101–111)
Creatinine, Ser: 10.22 mg/dL — ABNORMAL HIGH (ref 0.61–1.24)
GFR, EST AFRICAN AMERICAN: 7 mL/min — AB (ref >60)
GFR, EST NON AFRICAN AMERICAN: 6 mL/min — AB (ref >60)
Glucose, Bld: 78 mg/dL (ref 65–99)
PHOSPHORUS: 6.4 mg/dL — AB (ref 2.5–4.6)
Potassium: 4.1 mmol/L (ref 3.5–5.1)
SODIUM: 138 mmol/L (ref 135–145)

## 2014-09-03 LAB — CBC
HEMATOCRIT: 22.9 % — AB (ref 39.0–52.0)
Hemoglobin: 7.2 g/dL — ABNORMAL LOW (ref 13.0–17.0)
MCH: 28.1 pg (ref 26.0–34.0)
MCHC: 31.4 g/dL (ref 30.0–36.0)
MCV: 89.5 fL (ref 78.0–100.0)
Platelets: 137 10*3/uL — ABNORMAL LOW (ref 150–400)
RBC: 2.56 MIL/uL — ABNORMAL LOW (ref 4.22–5.81)
RDW: 15 % (ref 11.5–15.5)
WBC: 3.8 10*3/uL — AB (ref 4.0–10.5)

## 2014-09-03 NOTE — Progress Notes (Addendum)
Name: Patrick Downs MRN: [id number] DOB: 13-Feb-1976    ADMISSION DATE:  08/26/2014 CONSULTATION DATE:  5/5  REFERRING MD :  [facility]  CHIEF COMPLAINT:  Resp failure  BRIEF PATIENT DESCRIPTION: 39 y/o AAM with recent admit related to GBS, ARDS 2/2 aspiration, volume overload, ESRD on vent  SUBJECTIVE: No weaning, PEEP 10, emergent HD yesterday.    VITAL SIGNS: 97.1, 88, 24, 142/73, 97%   PHYSICAL EXAMINATION: General: awakens, some dyschrony Neuro:  rass -1, awakens and follows commands, perrl HEENT:  Trach clean Cardiovascular:  S1 s2 rrt Lungs: improved rhonchi Abdomen:  Obese + bs . TF going Musculoskeletal:  Low muscle mass, min movement/generalized weakness Skin:  No rash   Recent Labs Lab 09/01/14 0655 09/02/14 0727 09/03/14 0804  NA 138 139 138  K 4.4 4.3 4.1  CL 100* 101 101  CO2 19* 21* 22  BUN 66* 45* 43*  CREATININE 12.86* 10.31* 10.22*  GLUCOSE 77 107* 78    Recent Labs Lab 09/01/14 0655 09/02/14 0728 09/03/14 0804  HGB 7.0* 8.3* 7.2*  HCT 22.1* 25.9* 22.9*  WBC 3.4* 9.4 3.8*  PLT 135* 183 137*   Dg Chest Port 1 View  09/03/2014   CLINICAL DATA:  Respiratory failure.  EXAM: PORTABLE CHEST - 1 VIEW  COMPARISON:  09/02/2014.  FINDINGS: Tracheostomy tube, NG tube, right IJ line in stable position. Stable cardiomegaly. Interim partial improvement of bilateral pulmonary infiltrates. No pleural effusion or pneumothorax. Left subclavian vascular stent noted.  IMPRESSION: 1. Lines and tubes in stable position. 2. Interim partial clearing of bilateral diffuse pulmonary infiltrates. 3. Stable cardiomegaly.   Electronically Signed   By: Marcello Moores  Register   On: 09/03/2014 07:40   Dg Chest Port 1 View  09/02/2014   CLINICAL DATA:  Acute respiratory failure.  EXAM: PORTABLE CHEST - 1 VIEW  COMPARISON:  08/30/2014  FINDINGS: Tracheostomy. Enteric tube with tip in the left upper quadrant consistent with location in the stomach. Shallow inspiration. Diffuse bilateral  pulmonary infiltrates likely representing edema, ARDS, or pneumonia. Infiltrates are increasing since previous study. Difficult to visualize heart size and pulmonary vascularity through the infiltrates but heart size appears normal. No pleural effusions. No pneumothorax.  IMPRESSION: Increasing bilateral airspace infiltration in the lungs.   Electronically Signed   By: Lucienne Capers M.D.   On: 09/02/2014 06:38   Dg Abd Portable 1v  09/01/2014   CLINICAL DATA:  Nasogastric tube placement  EXAM: Portable exam 1700 hours compared to 08/26/2014  COMPARISON:  None.  FINDINGS: Tip of nasogastric tube projects over mid stomach.  Bowel gas pattern normal.  Osseous structures unremarkable.  IMPRESSION: Nasogastric tube tip projects over mid stomach.   Electronically Signed   By: Lavonia Dana M.D.   On: 09/01/2014 17:10    ASSESSMENT     Sepsis: currently on levophed at 10 mcg   CAP (community acquired pneumonia) Cultures at [facility] only grew Candidia   ESRD on dialysis. M W F. Note on pressors, ?? Should he be on CVVH?   A-V fistula rt fa Patent    ARDS (adult respiratory distress syndrome) cxr  With BASDZ.    Septic shock   Hypotension   Acute respiratory failure, VDRF. Not weanable. With a history of Guillain Barre syndrome and administration of NMB at [facility].    Fever   Guillain Barr syndrome, lower ext weakness    Hx of parathyroidectomy  Acute respiratory failure due to weakness. Aspiration PNA: GBS Septic shock due  to aspiration pneumonia - resolved ESRD-HD 5/11 Acute worsening pulm edema / HTN crisis  Plan: Trend PCXR Goal for negative balance, caution with blood (PRBC) volume Would NOT tx unless active bleeding or hgb less 7 D/C propofol  Resume precedex BP control per primary MD, to goal sys 140 Nephrology following, repeat HD 5/12 Wean PEEP for sats > 95% Maintain current MV  If able to peep 5 today then would SBT cpap ps 5/5, goal 2 hrs Would NOT trach collar at this  stage   CC Time: 30 minutes    Lavon Paganini. Titus Mould, MD, [place] Pgr: [facility]

## 2014-09-04 LAB — RENAL FUNCTION PANEL
Albumin: 2.1 g/dL — ABNORMAL LOW (ref 3.5–5.0)
Anion gap: 11 (ref 5–15)
BUN: 26 mg/dL — AB (ref 6–20)
CALCIUM: 8 mg/dL — AB (ref 8.9–10.3)
CHLORIDE: 104 mmol/L (ref 101–111)
CO2: 25 mmol/L (ref 22–32)
Creatinine, Ser: 7.03 mg/dL — ABNORMAL HIGH (ref 0.61–1.24)
GFR calc Af Amer: 10 mL/min — ABNORMAL LOW (ref >60)
GFR calc non Af Amer: 9 mL/min — ABNORMAL LOW (ref >60)
Glucose, Bld: 92 mg/dL (ref 65–99)
PHOSPHORUS: 4.4 mg/dL (ref 2.5–4.6)
Potassium: 3.8 mmol/L (ref 3.5–5.1)
SODIUM: 140 mmol/L (ref 135–145)

## 2014-09-04 LAB — CBC
HCT: 27.2 % — ABNORMAL LOW (ref 39.0–52.0)
Hemoglobin: 8.4 g/dL — ABNORMAL LOW (ref 13.0–17.0)
MCH: 27.7 pg (ref 26.0–34.0)
MCHC: 30.9 g/dL (ref 30.0–36.0)
MCV: 89.8 fL (ref 78.0–100.0)
Platelets: 127 10*3/uL — ABNORMAL LOW (ref 150–400)
RBC: 3.03 MIL/uL — ABNORMAL LOW (ref 4.22–5.81)
RDW: 14.9 % (ref 11.5–15.5)
WBC: 3.4 10*3/uL — AB (ref 4.0–10.5)

## 2014-09-04 LAB — TYPE AND SCREEN
ABO/RH(D): O POS
Antibody Screen: NEGATIVE
Unit division: 0
Unit division: 0

## 2014-09-04 LAB — CULTURE, BAL-QUANTITATIVE

## 2014-09-04 LAB — VANCOMYCIN, RANDOM: VANCOMYCIN RM: 13 ug/mL

## 2014-09-04 LAB — CULTURE, BAL-QUANTITATIVE W GRAM STAIN: Culture: NORMAL

## 2014-09-04 LAB — CULTURE, FUNGUS WITHOUT SMEAR

## 2014-09-04 NOTE — Progress Notes (Signed)
[facility]  ROUNDING NOTE   Subjective:  Pt seen during HD, BFR 400, tolerating well. Has been on daily dialysis.  Objective:  98.6 90 26 160/83 General: critically ill appearing Head:  [place]/AT eyes open Neck: tracheostomy in place CVS: S1S2 no rubs Resp: bilateral rhonchi, vent assisted ABD: soft NTND BS present EXT: 2+ b/l upper and lower extremity edema Neuro: awake, moving both UE's Access: Access: right arm AVG with fistular component.    Basic Metabolic Panel:  Recent Labs Lab 08/30/14 0600 09/01/14 0655 09/02/14 0727 09/03/14 0804 09/04/14 0735  NA 140 138 139 138 140  K 4.2 4.4 4.3 4.1 3.8  CL 100* 100* 101 101 104  CO2 25 19* 21* 22 25  GLUCOSE 87 77 107* 78 92  BUN 49* 66* 45* 43* 26*  CREATININE 9.96* 12.86* 10.31* 10.22* 7.03*  CALCIUM 8.0* 7.9* 8.2* 8.3* 8.0*  PHOS 7.8* 9.4* 7.4* 6.4* 4.4    Liver Function Tests:  Recent Labs Lab 08/30/14 0600 09/01/14 0655 09/02/14 0727 09/03/14 0804 09/04/14 0735  ALBUMIN 2.1* 2.1* 2.2* 2.3* 2.1*    CBC:  Recent Labs Lab 08/30/14 0600 09/01/14 0655 09/02/14 0728 09/03/14 0804 09/04/14 0815  WBC 3.4* 3.4* 9.4 3.8* 3.4*  HGB 6.9* 7.0* 8.3* 7.2* 8.4*  HCT 22.6* 22.1* 25.9* 22.9* 27.2*  MCV 92.6 89.5 89.3 89.5 89.8  PLT 147* 135* 183 137* 127*     CBG: No results for input(s): GLUCAP in the last 168 hours.  Microbiology: Results for orders placed or performed during the hospital encounter of 08/05/14  Influenza A&B Antigens [facility])     Status: None   Collection Time: 08/05/14  5:49 PM  Result Value Ref Range Status   Micro Text Report   Final       COMMENT                   NEGATIVE FOR INFLUENZA A (ANTIGEN ABSENT)   COMMENT                   NEGATIVE FOR INFLUENZA B (ANTIGEN ABSENT)   ANTIBIOTIC                                                      Culture, blood (single)     Status: None   Collection Time: 08/05/14  6:26 PM  Result Value Ref Range Status   Micro Text  Report   Final       COMMENT                   NO GROWTH AEROBICALLY/ANAEROBICALLY IN 5 DAYS   ANTIBIOTIC                                                      Culture, blood (single)     Status: None   Collection Time: 08/05/14  6:41 PM  Result Value Ref Range Status   Micro Text Report   Final       COMMENT                   NO GROWTH AEROBICALLY/ANAEROBICALLY IN 5 DAYS  ANTIBIOTIC                                                      Culture, blood (single)     Status: None   Collection Time: 08/05/14 10:15 PM  Result Value Ref Range Status   Micro Text Report   Final       COMMENT                   NO GROWTH AEROBICALLY/ANAEROBICALLY IN 5 DAYS   ANTIBIOTIC                                                      Culture, blood (single)     Status: None   Collection Time: 08/07/14 10:30 AM  Result Value Ref Range Status   Micro Text Report   Final       COMMENT                   NO GROWTH AEROBICALLY/ANAEROBICALLY IN 5 DAYS   ANTIBIOTIC                                                      Culture, blood (single)     Status: None   Collection Time: 08/07/14 10:43 AM  Result Value Ref Range Status   Micro Text Report   Final       COMMENT                   NO GROWTH AEROBICALLY/ANAEROBICALLY IN 5 DAYS   ANTIBIOTIC                                                      Culture, blood (single)     Status: None   Collection Time: 08/10/14  6:43 PM  Result Value Ref Range Status   Micro Text Report   Final       COMMENT                   NO GROWTH AEROBICALLY/ANAEROBICALLY IN 5 DAYS   ANTIBIOTIC                                                      Culture, blood (single)     Status: None   Collection Time: 08/10/14  6:43 PM  Result Value Ref Range Status   Micro Text Report   Final       COMMENT                   NO GROWTH AEROBICALLY/ANAEROBICALLY IN 5 DAYS   ANTIBIOTIC  Culture, blood (single)     Status:  None   Collection Time: 08/13/14 12:40 AM  Result Value Ref Range Status   Micro Text Report   Final       COMMENT                   NO GROWTH AEROBICALLY/ANAEROBICALLY IN 5 DAYS   ANTIBIOTIC                                                      Culture, blood (single)     Status: None   Collection Time: 08/13/14 12:50 AM  Result Value Ref Range Status   Micro Text Report   Final       COMMENT                   NO GROWTH AEROBICALLY/ANAEROBICALLY IN 5 DAYS   ANTIBIOTIC                                                      Culture, fungus without smear     Status: None   Collection Time: 08/13/14  8:40 AM  Result Value Ref Range Status   Micro Text Report   Final       SOURCE: BRONCH WASHING    ORGANISM 1                Candida albicans   COMMENT                   -   COMMENT                   -   ANTIBIOTIC                    ORG#1                                               Bronchial Wash Culture     Status: None   Collection Time: 08/13/14  8:40 AM  Result Value Ref Range Status   Micro Text Report   Final       ORGANISM 1                LIGHT GROWTH Candida albicans   COMMENT                   -   ANTIBIOTIC                    ORG#1                                               Influenza A,B,H1N1 - PCR ([facility])     Status: None   Collection Time: 08/15/14  2:20 PM  Result Value Ref Range Status   Influenza A By PCR NEGATIVE NEGATIVE Final   Influenza B By PCR  NEGATIVE NEGATIVE Final   H1N1 flu by pcr NOT DETECTED NOT-DETECTED Final    Comment:                  ----------------------- The Xpert Flu assay (FDA approval for nasal aspirates or washes and nasopharyngeal swab specimens), is intended as an aid in the diagnosis of influenza and should not be used as a sole basis for treatment.   Culture, expectorated sputum-assessment     Status: None   Collection Time: 08/21/14 12:36 PM  Result Value Ref Range Status   Micro Text Report   Final       SOURCE:  ETS    COMMENT                   APPEARS TO BE NORMAL FLORA AT 48 HOURS   GRAM STAIN                FEW WHITE BLOOD CELLS RARE GRAM NEGATIVE DIPLOCOCCI   GRAM STAIN                RARE GRAM POSITIVE COCCI   GRAM STAIN                GOOD SPECIMEN-80-90% WBC   GRAM STAIN                RARE GRAM NEGATIVE COCCO-BACILLI   ANTIBIOTIC                                                      Culture, blood (single)     Status: None (Preliminary result)   Collection Time: 08/21/14  3:31 PM  Result Value Ref Range Status   Micro Text Report   Preliminary       COMMENT                   NO GROWTH IN 36 HOURS   ANTIBIOTIC                                                      Culture, blood (single)     Status: None (Preliminary result)   Collection Time: 08/21/14  5:31 PM  Result Value Ref Range Status   Micro Text Report   Preliminary       COMMENT                   NO GROWTH IN 36 HOURS   ANTIBIOTIC                                                      Culture, bal-quantitative     Status: None (Preliminary result)   Collection Time: 08/25/14  1:48 PM  Result Value Ref Range Status   Specimen Description BRONCHIAL ALVEOLAR LAVAGE  Final   Special Requests NONE  Final   Gram Stain   Final    GOOD SPECIMEN - 80-90% WBCS FEW WBC SEEN RARE GRAM POSITIVE COCCI RARE GRAM NEGATIVE COCCOBACILLI RARE GRAM NEGATIVE  COCCI IN PAIRS    Colony Count PENDING  Incomplete   Culture APPEARS TO BE NORMAL FLORA AT 35 HOURS  Final   Report Status PENDING  Incomplete    Coagulation Studies: No results for input(s): LABPROT, INR in the last 72 hours.  Urinalysis: No results for input(s): COLORURINE, LABSPEC, PHURINE, GLUCOSEU, HGBUR, BILIRUBINUR, KETONESUR, PROTEINUR, UROBILINOGEN, NITRITE, LEUKOCYTESUR in the last 72 hours.  Invalid input(s): APPERANCEUR    Imaging: Dg Chest Port 1 View  09/03/2014   CLINICAL DATA:  Respiratory failure.  EXAM: PORTABLE CHEST - 1 VIEW  COMPARISON:  09/02/2014.   FINDINGS: Tracheostomy tube, NG tube, right IJ line in stable position. Stable cardiomegaly. Interim partial improvement of bilateral pulmonary infiltrates. No pleural effusion or pneumothorax. Left subclavian vascular stent noted.  IMPRESSION: 1. Lines and tubes in stable position. 2. Interim partial clearing of bilateral diffuse pulmonary infiltrates. 3. Stable cardiomegaly.   Electronically Signed   By: Marcello Moores  Register   On: 09/03/2014 07:40     Medications:       Assessment/ Plan:  39 y.o. Black male with End Stage Renal Disease on MWF schedule with right arm AVF/AVG followed by [facility]ph[facility] [address], hypertension, hyperlipidemia, Guillain-Barre syndrome, admission for fever 08/05/14  1. ESRD: MWF HD: N18.6 [place]. [facility]ph[facility]. - Pt seen during HD, tolerating well, continue daily dialysis for now from M-F, next HD on Monday.   2. Acute respiratory failure with pneumonia: J96.00, J15.9. S/P tracheostomy. -remains on vent, vent weaning per pulm/cc.  3. Anemia of Chronic Kidney Disease: D63.1  hgb 8.4, continue aranesp 162mcg [place] weekly.  4. Secondary Hyperparathyroidism:N25.81 -phos down to 4.4 with daily dialysis.   LOS:  Patrick Downs 5/13/20169:05 AM

## 2014-09-07 ENCOUNTER — Other Ambulatory Visit (HOSPITAL_COMMUNITY): Payer: Self-pay

## 2014-09-07 DIAGNOSIS — J80 Acute respiratory distress syndrome: Secondary | ICD-10-CM | POA: Diagnosis not present

## 2014-09-07 DIAGNOSIS — J9600 Acute respiratory failure, unspecified whether with hypoxia or hypercapnia: Secondary | ICD-10-CM | POA: Diagnosis not present

## 2014-09-07 LAB — RENAL FUNCTION PANEL
ANION GAP: 10 (ref 5–15)
Albumin: 2.1 g/dL — ABNORMAL LOW (ref 3.5–5.0)
BUN: 59 mg/dL — AB (ref 6–20)
CHLORIDE: 102 mmol/L (ref 101–111)
CO2: 25 mmol/L (ref 22–32)
Calcium: 9.5 mg/dL (ref 8.9–10.3)
Creatinine, Ser: 10.21 mg/dL — ABNORMAL HIGH (ref 0.61–1.24)
GFR calc Af Amer: 7 mL/min — ABNORMAL LOW (ref >60)
GFR, EST NON AFRICAN AMERICAN: 6 mL/min — AB (ref >60)
Glucose, Bld: 113 mg/dL — ABNORMAL HIGH (ref 65–99)
POTASSIUM: 4 mmol/L (ref 3.5–5.1)
Phosphorus: 4.3 mg/dL (ref 2.5–4.6)
Sodium: 137 mmol/L (ref 135–145)

## 2014-09-07 LAB — CBC
HCT: 21.7 % — ABNORMAL LOW (ref 39.0–52.0)
HEMOGLOBIN: 6.9 g/dL — AB (ref 13.0–17.0)
MCH: 28.5 pg (ref 26.0–34.0)
MCHC: 31.8 g/dL (ref 30.0–36.0)
MCV: 89.7 fL (ref 78.0–100.0)
Platelets: 172 10*3/uL (ref 150–400)
RBC: 2.42 MIL/uL — ABNORMAL LOW (ref 4.22–5.81)
RDW: 15.2 % (ref 11.5–15.5)
WBC: 4.4 10*3/uL (ref 4.0–10.5)

## 2014-09-07 LAB — VANCOMYCIN, RANDOM: Vancomycin Rm: 16 ug/mL

## 2014-09-07 LAB — PREPARE RBC (CROSSMATCH)

## 2014-09-07 NOTE — Progress Notes (Signed)
Subjective:   Tolerated dialysis well 3500 cc removed Per nursing report he has been texting his girlfriend  Objective:  Vital signs in last 24 hours:    146/80  81  18  98.7  Weight change:  There were no vitals filed for this visit.  Intake/Output:   -3500 cc with dialysis   Intake/Output this shift:     Physical Exam: General: NAD, critically ill  Head: Normocephalic, atraumatic.   ENT: Anicteric, NGT  Neck: Trach  Lungs:  Vent assistedfio2 30%  Heart: Regular rate and rhythm  Abdomen:  Soft, nontender,   Extremities: + peripheral edema.  Neurologic: Follows simple commands  Skin: No lesions  Access: Rt arm AVF    Basic Metabolic Panel:  Recent Labs Lab 09/01/14 0655 09/02/14 0727 09/03/14 0804 09/04/14 0735 09/07/14 0615  NA 138 139 138 140 137  K 4.4 4.3 4.1 3.8 4.0  CL 100* 101 101 104 102  CO2 19* 21* 22 25 25   GLUCOSE 77 107* 78 92 113*  BUN 66* 45* 43* 26* 59*  CREATININE 12.86* 10.31* 10.22* 7.03* 10.21*  CALCIUM 7.9* 8.2* 8.3* 8.0* 9.5  PHOS 9.4* 7.4* 6.4* 4.4 4.3    Liver Function Tests:  Recent Labs Lab 09/01/14 0655 09/02/14 0727 09/03/14 0804 09/04/14 0735 09/07/14 0615  ALBUMIN 2.1* 2.2* 2.3* 2.1* 2.1*   No results for input(s): LIPASE, AMYLASE in the last 168 hours. No results for input(s): AMMONIA in the last 168 hours.  CBC:  Recent Labs Lab 09/01/14 0655 09/02/14 0728 09/03/14 0804 09/04/14 0815 09/07/14 0615  WBC 3.4* 9.4 3.8* 3.4* 4.4  HGB 7.0* 8.3* 7.2* 8.4* 6.9*  HCT 22.1* 25.9* 22.9* 27.2* 21.7*  MCV 89.5 89.3 89.5 89.8 89.7  PLT 135* 183 137* 127* 172    Cardiac Enzymes: No results for input(s): CKTOTAL, CKMB, CKMBINDEX, TROPONINI in the last 168 hours.  BNP: Invalid input(s): POCBNP  CBG: No results for input(s): GLUCAP in the last 168 hours.  Microbiology: Results for orders placed or performed during the hospital encounter of 08/05/14  Influenza A&B Antigens [facility])     Status: None    Collection Time: 08/05/14  5:49 PM  Result Value Ref Range Status   Micro Text Report   Final       COMMENT                   NEGATIVE FOR INFLUENZA A (ANTIGEN ABSENT)   COMMENT                   NEGATIVE FOR INFLUENZA B (ANTIGEN ABSENT)   ANTIBIOTIC                                                      Culture, blood (single)     Status: None   Collection Time: 08/05/14  6:26 PM  Result Value Ref Range Status   Micro Text Report   Final       COMMENT                   NO GROWTH AEROBICALLY/ANAEROBICALLY IN 5 DAYS   ANTIBIOTIC  Culture, blood (single)     Status: None   Collection Time: 08/05/14  6:41 PM  Result Value Ref Range Status   Micro Text Report   Final       COMMENT                   NO GROWTH AEROBICALLY/ANAEROBICALLY IN 5 DAYS   ANTIBIOTIC                                                      Culture, blood (single)     Status: None   Collection Time: 08/05/14 10:15 PM  Result Value Ref Range Status   Micro Text Report   Final       COMMENT                   NO GROWTH AEROBICALLY/ANAEROBICALLY IN 5 DAYS   ANTIBIOTIC                                                      Culture, blood (single)     Status: None   Collection Time: 08/07/14 10:30 AM  Result Value Ref Range Status   Micro Text Report   Final       COMMENT                   NO GROWTH AEROBICALLY/ANAEROBICALLY IN 5 DAYS   ANTIBIOTIC                                                      Culture, blood (single)     Status: None   Collection Time: 08/07/14 10:43 AM  Result Value Ref Range Status   Micro Text Report   Final       COMMENT                   NO GROWTH AEROBICALLY/ANAEROBICALLY IN 5 DAYS   ANTIBIOTIC                                                      Culture, blood (single)     Status: None   Collection Time: 08/10/14  6:43 PM  Result Value Ref Range Status   Micro Text Report   Final       COMMENT                   NO  GROWTH AEROBICALLY/ANAEROBICALLY IN 5 DAYS   ANTIBIOTIC                                                      Culture, blood (single)     Status: None   Collection  Time: 08/10/14  6:43 PM  Result Value Ref Range Status   Micro Text Report   Final       COMMENT                   NO GROWTH AEROBICALLY/ANAEROBICALLY IN 5 DAYS   ANTIBIOTIC                                                      Culture, blood (single)     Status: None   Collection Time: 08/13/14 12:40 AM  Result Value Ref Range Status   Micro Text Report   Final       COMMENT                   NO GROWTH AEROBICALLY/ANAEROBICALLY IN 5 DAYS   ANTIBIOTIC                                                      Culture, blood (single)     Status: None   Collection Time: 08/13/14 12:50 AM  Result Value Ref Range Status   Micro Text Report   Final       COMMENT                   NO GROWTH AEROBICALLY/ANAEROBICALLY IN 5 DAYS   ANTIBIOTIC                                                      Culture, fungus without smear     Status: None   Collection Time: 08/13/14  8:40 AM  Result Value Ref Range Status   Micro Text Report   Final       SOURCE: BRONCH WASHING    ORGANISM 1                Candida albicans   COMMENT                   -   COMMENT                   -   ANTIBIOTIC                    ORG#1                                               Bronchial Wash Culture     Status: None   Collection Time: 08/13/14  8:40 AM  Result Value Ref Range Status   Micro Text Report   Final       ORGANISM 1                LIGHT GROWTH Candida albicans   COMMENT                   -   ANTIBIOTIC  ORG#1                                               Influenza A,B,H1N1 - PCR [facility])     Status: None   Collection Time: 08/15/14  2:20 PM  Result Value Ref Range Status   Influenza A By PCR NEGATIVE NEGATIVE Final   Influenza B By PCR NEGATIVE NEGATIVE Final   H1N1 flu by pcr NOT DETECTED NOT-DETECTED Final     Comment:                  ----------------------- The Xpert Flu assay (FDA approval for nasal aspirates or washes and nasopharyngeal swab specimens), is intended as an aid in the diagnosis of influenza and should not be used as a sole basis for treatment.   Culture, expectorated sputum-assessment     Status: None   Collection Time: 08/21/14 12:36 PM  Result Value Ref Range Status   Micro Text Report   Final       SOURCE: ETS    COMMENT                   APPEARS TO BE NORMAL FLORA AT 48 HOURS   GRAM STAIN                FEW WHITE BLOOD CELLS RARE GRAM NEGATIVE DIPLOCOCCI   GRAM STAIN                RARE GRAM POSITIVE COCCI   GRAM STAIN                GOOD SPECIMEN-80-90% WBC   GRAM STAIN                RARE GRAM NEGATIVE COCCO-BACILLI   ANTIBIOTIC                                                      Culture, blood (single)     Status: None (Preliminary result)   Collection Time: 08/21/14  3:31 PM  Result Value Ref Range Status   Micro Text Report   Preliminary       COMMENT                   NO GROWTH IN 36 HOURS   ANTIBIOTIC                                                      Culture, blood (single)     Status: None (Preliminary result)   Collection Time: 08/21/14  5:31 PM  Result Value Ref Range Status   Micro Text Report   Preliminary       COMMENT                   NO GROWTH IN 36 HOURS   ANTIBIOTIC  Culture, bal-quantitative     Status: None   Collection Time: 08/25/14  1:48 PM  Result Value Ref Range Status   Specimen Description BRONCHIAL ALVEOLAR LAVAGE  Final   Special Requests NONE  Final   Gram Stain   Final    GOOD SPECIMEN - 80-90% WBCS FEW WBC SEEN RARE GRAM POSITIVE COCCI RARE GRAM NEGATIVE COCCOBACILLI RARE GRAM NEGATIVE COCCI IN PAIRS    Culture APPEARS TO BE NORMAL FLORA AT 25 HOURS  Final   Report Status 09/04/2014 FINAL  Final    Coagulation Studies: No results for input(s): LABPROT,  INR in the last 72 hours.  Urinalysis: No results for input(s): COLORURINE, LABSPEC, PHURINE, GLUCOSEU, HGBUR, BILIRUBINUR, KETONESUR, PROTEINUR, UROBILINOGEN, NITRITE, LEUKOCYTESUR in the last 72 hours.  Invalid input(s): APPERANCEUR    Imaging: No results found.   Medications:       Assessment/ Plan:  39 y.o. AA male with End Stage Renal Disease on MWF schedule with right arm AVF/AVG followed by [facility] [address], hypertension, hyperlipidemia, Guillain-Barre syndrome, admission for fever 08/05/14  1. ESRD: MWF HD: N18.6 [place]. [facility]. - continue to dialyze on schedule. Next HD on Wed.   2. Acute respiratory failure with pneumonia: J96.00, J15.9. S/P tracheostomy. -remains on vent, vent weaning per pulm/cc - SBT planned for tomorrow.  3. Anemia of Chronic Kidney Disease: D63.1    continue aranesp 182mcg [place] weekly. Blood transfusion planned for today  4. Secondary Hyperparathyroidism:N25.81 -phos down to 4.3 with daily dialysis.    LOS:  Murlean Iba 5/16/20165:17 PM

## 2014-09-07 NOTE — Progress Notes (Addendum)
Name: Patrick Downs MRN: [id number] DOB: May 13, 1975    ADMISSION DATE:  08/26/2014 CONSULTATION DATE:  5/5  REFERRING MD :  [facility]  CHIEF COMPLAINT:  Resp failure  BRIEF PATIENT DESCRIPTION: 39 y/o AAM with recent admit related to GBS, ARDS 2/2 aspiration, volume overload, ESRD on vent  EVENTS 5.12.16: : No weaning, PEEP 10, emergent HD yesterday.     SUBJECTIVE/OVERNIGHT/INTERVAL HX 09/07/2014: On full vent support. On HD currently. On fio2 30%,  Peep 5. Never been tried on weaning. Off levophed. ON precedex. Peg pending     VITAL SIGNS: 144/86, RR 21, HR 92, Pulse ox 99% on 28% vent    PHYSICAL EXAMINATION: General: awakens, some dyschrony Neuro:  rass 0. Follows commands and oriented x 3.  HEENT:  Trach clean Cardiovascular:  S1 s2 rrt Lungs: improved rhonchi Abdomen:  Obese + bs . TF going Musculoskeletal:  Low muscle mass, min movement/generalized weakness Skin:  No rash    PULMONARY  Recent Labs Lab 09/02/14 0600 09/02/14 0735 09/02/14 1240 09/03/14 0510  PHART 7.249* 7.303* 7.435 7.372  PCO2ART 47.1* 43.8 32.6* 37.4  PO2ART 80.8 73.1* 321* 185*  HCO3 19.9* 21.0 21.4 21.2  TCO2 21.3 22.4 22.4 22.3  O2SAT 92.4 91.0 100.0 99.3    CBC  Recent Labs Lab 09/03/14 0804 09/04/14 0815 09/07/14 0615  HGB 7.2* 8.4* 6.9*  HCT 22.9* 27.2* 21.7*  WBC 3.8* 3.4* 4.4  PLT 137* 127* 172    COAGULATION  Recent Labs Lab 08/31/14 1749  INR 1.46    CARDIAC  No results for input(s): TROPONINI in the last 168 hours. No results for input(s): PROBNP in the last 168 hours.   CHEMISTRY  Recent Labs Lab 09/01/14 0655 09/02/14 0727 09/03/14 0804 09/04/14 0735 09/07/14 0615  NA 138 139 138 140 137  K 4.4 4.3 4.1 3.8 4.0  CL 100* 101 101 104 102  CO2 19* 21* 22 25 25   GLUCOSE 77 107* 78 92 113*  BUN 66* 45* 43* 26* 59*  CREATININE 12.86* 10.31* 10.22* 7.03* 10.21*  CALCIUM 7.9* 8.2* 8.3* 8.0* 9.5  PHOS 9.4* 7.4* 6.4* 4.4 4.3   Estimated  Creatinine Clearance: 14.3 mL/min (by C-G formula based on Cr of 10.21).   LIVER  Recent Labs Lab 08/31/14 1749 09/01/14 0655 09/02/14 0727 09/03/14 0804 09/04/14 0735 09/07/14 0615  ALBUMIN  --  2.1* 2.2* 2.3* 2.1* 2.1*  INR 1.46  --   --   --   --   --      INFECTIOUS No results for input(s): LATICACIDVEN, PROCALCITON in the last 168 hours.   ENDOCRINE CBG (last 3)  No results for input(s): GLUCAP in the last 72 hours.       IMAGING x48h  -No results found.  Czxr5/12/16- ards infiltrates persist - personally reveied     ASSESSMENT     ARDS (adult respiratory distress syndrome) cxr  With BASDZ. folllowing GBS  - improved 09/07/2014   Anemai of critical illness  - hgb 6.9gm% 09/07/2014 and worse  Acute encephalopathyt  - controlled on precedex  Plan: ARDS: CXR port 09/08/14 and Start SBT 09/08/14 for vent weaning Anemia: 1 unit prbc only - cancel 2nd unit prbc (already has antibody problem and goal hgb > 7gm%) Enceph: Continue precedex; is helping    Dr. Brand Males, M.D., [facility].C.P Pulmonary and Critical Care Medicine Staff Physician [place] [facility] Pager: [phone], If no answer or between  15:00h -  7:00h: call [phone]  09/07/2014 11:52 AM

## 2014-09-08 ENCOUNTER — Other Ambulatory Visit (HOSPITAL_COMMUNITY): Payer: Self-pay

## 2014-09-08 LAB — CBC
HEMATOCRIT: 24.8 % — AB (ref 39.0–52.0)
Hemoglobin: 7.8 g/dL — ABNORMAL LOW (ref 13.0–17.0)
MCH: 28.2 pg (ref 26.0–34.0)
MCHC: 31.5 g/dL (ref 30.0–36.0)
MCV: 89.5 fL (ref 78.0–100.0)
PLATELETS: 141 10*3/uL — AB (ref 150–400)
RBC: 2.77 MIL/uL — AB (ref 4.22–5.81)
RDW: 15.2 % (ref 11.5–15.5)
WBC: 4.6 10*3/uL (ref 4.0–10.5)

## 2014-09-08 MED ORDER — MIDAZOLAM HCL 2 MG/2ML IJ SOLN
INTRAMUSCULAR | Status: AC | PRN
  Administered 2014-09-08: 0.5 mg via INTRAVENOUS
  Administered 2014-09-08: 1 mg via INTRAVENOUS

## 2014-09-08 MED ORDER — IOHEXOL 300 MG/ML  SOLN
50.0000 mL | Freq: Once | INTRAMUSCULAR | Status: AC | PRN
  Administered 2014-09-08: 10 mL via INTRAVENOUS

## 2014-09-08 MED ORDER — LIDOCAINE HCL 1 % IJ SOLN
INTRAMUSCULAR | Status: AC
  Filled 2014-09-08: qty 20

## 2014-09-08 MED ORDER — GLUCAGON HCL RDNA (DIAGNOSTIC) 1 MG IJ SOLR
INTRAMUSCULAR | Status: AC
  Filled 2014-09-08: qty 1

## 2014-09-08 MED ORDER — GLUCAGON HCL (RDNA) 1 MG IJ SOLR
INTRAMUSCULAR | Status: AC | PRN
  Administered 2014-09-08: 1 mg via INTRAVENOUS

## 2014-09-08 MED ORDER — FENTANYL CITRATE (PF) 100 MCG/2ML IJ SOLN
INTRAMUSCULAR | Status: AC
  Filled 2014-09-08: qty 2

## 2014-09-08 MED ORDER — MIDAZOLAM HCL 2 MG/2ML IJ SOLN
INTRAMUSCULAR | Status: AC
  Filled 2014-09-08: qty 2

## 2014-09-08 MED ORDER — FENTANYL CITRATE (PF) 100 MCG/2ML IJ SOLN
INTRAMUSCULAR | Status: AC | PRN
  Administered 2014-09-08: 25 ug via INTRAVENOUS
  Administered 2014-09-08: 50 ug via INTRAVENOUS

## 2014-09-08 NOTE — Procedures (Signed)
Successful fluoroscopic guided insertion of gastrostomy tube without immediate post procedural complicatoin.   The gastrostomy tube may be used immediately for medications.  Tube feeds may be initiated in 24 hours as per the primary team.

## 2014-09-09 LAB — RENAL FUNCTION PANEL
ALBUMIN: 2.2 g/dL — AB (ref 3.5–5.0)
ANION GAP: 11 (ref 5–15)
BUN: 57 mg/dL — AB (ref 6–20)
CHLORIDE: 102 mmol/L (ref 101–111)
CO2: 25 mmol/L (ref 22–32)
Calcium: 9.8 mg/dL (ref 8.9–10.3)
Creatinine, Ser: 9.57 mg/dL — ABNORMAL HIGH (ref 0.61–1.24)
GFR calc Af Amer: 7 mL/min — ABNORMAL LOW (ref >60)
GFR, EST NON AFRICAN AMERICAN: 6 mL/min — AB (ref >60)
GLUCOSE: 93 mg/dL (ref 65–99)
POTASSIUM: 3.8 mmol/L (ref 3.5–5.1)
Phosphorus: 3.6 mg/dL (ref 2.5–4.6)
Sodium: 138 mmol/L (ref 135–145)

## 2014-09-09 LAB — CBC
HCT: 24.3 % — ABNORMAL LOW (ref 39.0–52.0)
HEMOGLOBIN: 7.7 g/dL — AB (ref 13.0–17.0)
MCH: 28.5 pg (ref 26.0–34.0)
MCHC: 31.7 g/dL (ref 30.0–36.0)
MCV: 90 fL (ref 78.0–100.0)
Platelets: 128 10*3/uL — ABNORMAL LOW (ref 150–400)
RBC: 2.7 MIL/uL — ABNORMAL LOW (ref 4.22–5.81)
RDW: 15.3 % (ref 11.5–15.5)
WBC: 4.4 10*3/uL (ref 4.0–10.5)

## 2014-09-09 LAB — VANCOMYCIN, TROUGH: Vancomycin Tr: 22 ug/mL — ABNORMAL HIGH (ref 10.0–20.0)

## 2014-09-09 NOTE — Progress Notes (Signed)
Subjective:   Tolerated dialysis well 4500 cc removed PEG tube placed 09/08/14 bfr 400 dfr 800  Objective:  Vital signs in last 24 hours:  Pulse Rate:  [82-120] 98 (05/17 1706) Resp:  [14-18] 14 (05/17 1706) BP: (174-199)/(108-146) 189/108 mmHg (05/17 1706) SpO2:  [99 %-100 %] 100 % (05/17 1706) 146/80  81  18  98.7  Weight change:  There were no vitals filed for this visit.  Intake/Output:   -4500 cc with dialysis   Intake/Output this shift:    Post VS: 98.2  106  18  186/105   Physical Exam: General: NAD, critically ill  Head: atraumatic.   ENT: Anicteric,  Neck: Trach  Lungs:  Vent assistedfio2 30%  Heart: tachycardic  Abdomen:  Soft, nontender, PEG in place  Extremities: + peripheral edema.  Neurologic: Mildly agitated  Skin: No lesions  Access: Rt arm AVF    Basic Metabolic Panel:  Recent Labs Lab 09/03/14 0804 09/04/14 0735 09/07/14 0615 09/09/14 0620  NA 138 140 137 138  K 4.1 3.8 4.0 3.8  CL 101 104 102 102  CO2 22 25 25 25   GLUCOSE 78 92 113* 93  BUN 43* 26* 59* 57*  CREATININE 10.22* 7.03* 10.21* 9.57*  CALCIUM 8.3* 8.0* 9.5 9.8  PHOS 6.4* 4.4 4.3 3.6    Liver Function Tests:  Recent Labs Lab 09/03/14 0804 09/04/14 0735 09/07/14 0615 09/09/14 0620  ALBUMIN 2.3* 2.1* 2.1* 2.2*   No results for input(s): LIPASE, AMYLASE in the last 168 hours. No results for input(s): AMMONIA in the last 168 hours.  CBC:  Recent Labs Lab 09/03/14 0804 09/04/14 0815 09/07/14 0615 09/08/14 0924 09/09/14 0620  WBC 3.8* 3.4* 4.4 4.6 4.4  HGB 7.2* 8.4* 6.9* 7.8* 7.7*  HCT 22.9* 27.2* 21.7* 24.8* 24.3*  MCV 89.5 89.8 89.7 89.5 90.0  PLT 137* 127* 172 141* 128*    Cardiac Enzymes: No results for input(s): CKTOTAL, CKMB, CKMBINDEX, TROPONINI in the last 168 hours.  BNP: Invalid input(s): POCBNP  CBG: No results for input(s): GLUCAP in the last 168 hours.  Microbiology: Results for orders placed or performed during the hospital encounter  of 08/05/14  Influenza A&B Antigens [facility])     Status: None   Collection Time: 08/05/14  5:49 PM  Result Value Ref Range Status   Micro Text Report   Final       COMMENT                   NEGATIVE FOR INFLUENZA A (ANTIGEN ABSENT)   COMMENT                   NEGATIVE FOR INFLUENZA B (ANTIGEN ABSENT)   ANTIBIOTIC                                                      Culture, blood (single)     Status: None   Collection Time: 08/05/14  6:26 PM  Result Value Ref Range Status   Micro Text Report   Final       COMMENT                   NO GROWTH AEROBICALLY/ANAEROBICALLY IN 5 DAYS   ANTIBIOTIC  Culture, blood (single)     Status: None   Collection Time: 08/05/14  6:41 PM  Result Value Ref Range Status   Micro Text Report   Final       COMMENT                   NO GROWTH AEROBICALLY/ANAEROBICALLY IN 5 DAYS   ANTIBIOTIC                                                      Culture, blood (single)     Status: None   Collection Time: 08/05/14 10:15 PM  Result Value Ref Range Status   Micro Text Report   Final       COMMENT                   NO GROWTH AEROBICALLY/ANAEROBICALLY IN 5 DAYS   ANTIBIOTIC                                                      Culture, blood (single)     Status: None   Collection Time: 08/07/14 10:30 AM  Result Value Ref Range Status   Micro Text Report   Final       COMMENT                   NO GROWTH AEROBICALLY/ANAEROBICALLY IN 5 DAYS   ANTIBIOTIC                                                      Culture, blood (single)     Status: None   Collection Time: 08/07/14 10:43 AM  Result Value Ref Range Status   Micro Text Report   Final       COMMENT                   NO GROWTH AEROBICALLY/ANAEROBICALLY IN 5 DAYS   ANTIBIOTIC                                                      Culture, blood (single)     Status: None   Collection Time: 08/10/14  6:43 PM  Result Value Ref Range Status    Micro Text Report   Final       COMMENT                   NO GROWTH AEROBICALLY/ANAEROBICALLY IN 5 DAYS   ANTIBIOTIC                                                      Culture, blood (single)     Status: None   Collection  Time: 08/10/14  6:43 PM  Result Value Ref Range Status   Micro Text Report   Final       COMMENT                   NO GROWTH AEROBICALLY/ANAEROBICALLY IN 5 DAYS   ANTIBIOTIC                                                      Culture, blood (single)     Status: None   Collection Time: 08/13/14 12:40 AM  Result Value Ref Range Status   Micro Text Report   Final       COMMENT                   NO GROWTH AEROBICALLY/ANAEROBICALLY IN 5 DAYS   ANTIBIOTIC                                                      Culture, blood (single)     Status: None   Collection Time: 08/13/14 12:50 AM  Result Value Ref Range Status   Micro Text Report   Final       COMMENT                   NO GROWTH AEROBICALLY/ANAEROBICALLY IN 5 DAYS   ANTIBIOTIC                                                      Culture, fungus without smear     Status: None   Collection Time: 08/13/14  8:40 AM  Result Value Ref Range Status   Micro Text Report   Final       SOURCE: BRONCH WASHING    ORGANISM 1                Candida albicans   COMMENT                   -   COMMENT                   -   ANTIBIOTIC                    ORG#1                                               Bronchial Wash Culture     Status: None   Collection Time: 08/13/14  8:40 AM  Result Value Ref Range Status   Micro Text Report   Final       ORGANISM 1                LIGHT GROWTH Candida albicans   COMMENT                   -   ANTIBIOTIC  ORG#1                                               Influenza A,B,H1N1 - PCR [facility])     Status: None   Collection Time: 08/15/14  2:20 PM  Result Value Ref Range Status   Influenza A By PCR NEGATIVE NEGATIVE Final   Influenza B By PCR NEGATIVE  NEGATIVE Final   H1N1 flu by pcr NOT DETECTED NOT-DETECTED Final    Comment:                  ----------------------- The Xpert Flu assay (FDA approval for nasal aspirates or washes and nasopharyngeal swab specimens), is intended as an aid in the diagnosis of influenza and should not be used as a sole basis for treatment.   Culture, expectorated sputum-assessment     Status: None   Collection Time: 08/21/14 12:36 PM  Result Value Ref Range Status   Micro Text Report   Final       SOURCE: ETS    COMMENT                   APPEARS TO BE NORMAL FLORA AT 48 HOURS   GRAM STAIN                FEW WHITE BLOOD CELLS RARE GRAM NEGATIVE DIPLOCOCCI   GRAM STAIN                RARE GRAM POSITIVE COCCI   GRAM STAIN                GOOD SPECIMEN-80-90% WBC   GRAM STAIN                RARE GRAM NEGATIVE COCCO-BACILLI   ANTIBIOTIC                                                      Culture, blood (single)     Status: None (Preliminary result)   Collection Time: 08/21/14  3:31 PM  Result Value Ref Range Status   Micro Text Report   Preliminary       COMMENT                   NO GROWTH IN 36 HOURS   ANTIBIOTIC                                                      Culture, blood (single)     Status: None (Preliminary result)   Collection Time: 08/21/14  5:31 PM  Result Value Ref Range Status   Micro Text Report   Preliminary       COMMENT                   NO GROWTH IN 36 HOURS   ANTIBIOTIC  Culture, bal-quantitative     Status: None   Collection Time: 08/25/14  1:48 PM  Result Value Ref Range Status   Specimen Description BRONCHIAL ALVEOLAR LAVAGE  Final   Special Requests NONE  Final   Gram Stain   Final    GOOD SPECIMEN - 80-90% WBCS FEW WBC SEEN RARE GRAM POSITIVE COCCI RARE GRAM NEGATIVE COCCOBACILLI RARE GRAM NEGATIVE COCCI IN PAIRS    Culture APPEARS TO BE NORMAL FLORA AT 80 HOURS  Final   Report Status 09/04/2014 FINAL   Final    Coagulation Studies: No results for input(s): LABPROT, INR in the last 72 hours.  Urinalysis: No results for input(s): COLORURINE, LABSPEC, PHURINE, GLUCOSEU, HGBUR, BILIRUBINUR, KETONESUR, PROTEINUR, UROBILINOGEN, NITRITE, LEUKOCYTESUR in the last 72 hours.  Invalid input(s): APPERANCEUR    Imaging: Ir Gastrostomy Tube Mod Sed  09/08/2014   INDICATION: History of Guillain-Barre syndrome complicated by respiratory failure and dysphagia. Request made for placement of a percutaneous gastrostomy tube for enteric nutrition supplementation  EXAM: PULL TROUGH GASTROSTOMY TUBE PLACEMENT  COMPARISON:  CT abdomen pelvis - 08/10/2014  MEDICATIONS: The patient is currently admitted to the hospital and receiving intravenous antibiotics.  Antibiotics were administered within 1 hour of the procedure.  CONTRAST:  20 mL of Isovue 300 administered into the gastric lumen.  ANESTHESIA/SEDATION: Versed 1.5 mg IV; Fentanyl 50 mcg IV  Sedation time  30 minutes  FLUOROSCOPY TIME:  3 minutes (0000000 mGy)  COMPLICATIONS: None immediate  PROCEDURE: Informed written consent was obtained from the patient's family following explanation of the procedure, risks, benefits and alternatives. A time out was performed prior to the initiation of the procedure. Ultrasound scanning was performed to demarcate the edge of the left lobe of the liver. Maximal barrier sterile technique utilized including caps, mask, sterile gowns, sterile gloves, large sterile drape, hand hygiene and Betadine prep.  The left upper quadrant was sterilely prepped and draped. An oral gastric catheter was inserted into the stomach under fluoroscopy. The existing nasogastric feeding tube was removed. The left costal margin and air opacified transverse colon were identified and avoided. Air was injected into the stomach for insufflation and visualization under fluoroscopy. Under sterile conditions a 17 gauge trocar needle was utilized to access the stomach  percutaneously beneath the left subcostal margin after the overlying soft tissues were anesthetized with 1% Lidocaine with epinephrine. Needle position was confirmed within the stomach with aspiration of air and injection of small amount of contrast. A single T tack was deployed for gastropexy. Over an Amplatz guide wire, a 9-French sheath was inserted into the stomach. A snare device was utilized to capture the oral gastric catheter. The snare device was pulled retrograde from the stomach up the esophagus and out the oropharynx. The 20-French pull-through gastrostomy was connected to the snare device and pulled antegrade through the oropharynx down the esophagus into the stomach and then through the percutaneous tract external to the patient. The gastrostomy was assembled externally. Contrast injection confirms position in the stomach. Several spot radiographic images were obtained in various obliquities for documentation. The patient tolerated procedure well without immediate post procedural complication.  FINDINGS: After successful fluoroscopic guided placement, the gastrostomy tube is appropriately positioned with internal disc against the ventral aspect of the gastric lumen.  IMPRESSION: Successful fluoroscopic insertion of a 20-French pull-through gastrostomy tube.  The gastrostomy may be used immediately for medication administration and in 24 hrs for the initiation of feeds.   Electronically Signed   By: Sandi Mariscal  M.D.   On: 09/08/2014 17:21   Dg Chest Port 1 View  09/08/2014   CLINICAL DATA:  Respiratory failure  EXAM: PORTABLE CHEST - 1 VIEW  COMPARISON:  Portable chest x-ray of Sep 03, 2014  FINDINGS: The lungs are mildly hypoinflated. The bilateral alveolar opacities persist. The cardiac silhouette remains enlarged. The pulmonary vascularity remains engorged. The tracheostomy appliance tip lies at the level of the superior margin of the clavicular heads. The esophagogastric tube tip projects below  the GE junction. The right internal jugular venous catheter tip projects at the level of the junction of the SVC with the right atrium.  IMPRESSION: There has not been significant interval change in the appearance of the chest since yesterday's study. Persistent bilateral alveolar opacities are consistent with pulmonary edema or pneumonia.   Electronically Signed   By: David  Martinique M.D.   On: 09/08/2014 07:34     Medications:       Assessment/ Plan:  39 y.o. AA male with End Stage Renal Disease on MWF schedule with right arm AVF/AVG followed by [facility] [address], hypertension, hyperlipidemia, Guillain-Barre syndrome, admission for fever 08/05/14  1. ESRD: MWF HD: N18.6 [place]. [facility]. - continue to dialyze on schedule. Next HD on Wed.  - 4500 cc removed today  2. Acute respiratory failure with pneumonia: J96.00, J15.9. S/P tracheostomy. -remains on vent, vent weaning per pulm/cc  3. Anemia of Chronic Kidney Disease: D63.1    continue aranesp 223mcg Stig[place] weekly.  Blood transfusion given this admission  4. Secondary Hyperparathyroidism:N25.81 -phos down to 3.6 with dialysis.   LOS:  Murlean Iba 5/18/20164:27 PM

## 2014-09-10 DIAGNOSIS — J80 Acute respiratory distress syndrome: Secondary | ICD-10-CM | POA: Diagnosis not present

## 2014-09-10 NOTE — Progress Notes (Signed)
Name: Patrick Downs MRN: [id number] DOB: 25-Feb-1976    ADMISSION DATE:  08/26/2014 CONSULTATION DATE:  5/5  REFERRING MD :  [facility]  CHIEF COMPLAINT:  Resp failure  BRIEF PATIENT DESCRIPTION: 39 y/o AAM with recent admit related to GBS, ARDS 2/2 aspiration, volume overload, ESRD on vent  EVENTS 5.12.16: : No weaning, PEEP 10, emergent HD yesterday.    09/07/2014: On full vent support. On HD currently. On fio2 30%,  Peep 5. Never been tried on weaning. Off levophed. ON precedex. Peg pending    SUBJECTIVE/OVERNIGHT/INTERVAL HX 5./19/16    - s/p PEG 09/08/14. Then vomit x 2 09/09/14 and again overnight but none this morning. Team suspects is due to stress of HD and PEG and IV fentanyl all together. Primary team giving reglan./zofran  - PSV 8/5 x 12h yesterday. ATC held off due to nausea and phenergan making him a bit sleepy  - CHronic ESRD HD on MWF - going welll. ANuric +   - Anemia now improved after 1 unit prbc  - enceph: opff precedex   Hx from RN, RT, PT, [employer] on MDT rounds.     VITAL SIGNS:  09/10/14: Temp 98.73F, HR 115, RR 20,pulse ox 171/83, pulse ox 98%    PHYSICAL EXAMINATION: General: awakens, some dyschrony Neuro:  rass 0. Follows commands and oriented x 3.  HEENT:  Trach clean Cardiovascular:  S1 s2 rrt Lungs: improved rhonchi Abdomen:  Obese + bs . TF going Musculoskeletal:  Low muscle mass, min movement/generalized weakness Skin:  No rash. No bedsores as reported by RN    PULMONARY No results for input(s): PHART, PCO2ART, PO2ART, HCO3, TCO2, O2SAT in the last 168 hours.  Invalid input(s): PCO2, PO2  CBC  Recent Labs Lab 09/07/14 0615 09/08/14 0924 09/09/14 0620  HGB 6.9* 7.8* 7.7*  HCT 21.7* 24.8* 24.3*  WBC 4.4 4.6 4.4  PLT 172 141* 128*    COAGULATION No results for input(s): INR in the last 168 hours.  CARDIAC  No results for input(s): TROPONINI in the last 168 hours. No results for input(s): PROBNP in the last 168  hours.   CHEMISTRY  Recent Labs Lab 09/04/14 0735 09/07/14 0615 09/09/14 0620  NA 140 137 138  K 3.8 4.0 3.8  CL 104 102 102  CO2 25 25 25   GLUCOSE 92 113* 93  BUN 26* 59* 57*  CREATININE 7.03* 10.21* 9.57*  CALCIUM 8.0* 9.5 9.8  PHOS 4.4 4.3 3.6   CrCl cannot be calculated (Unknown ideal weight.).   LIVER  Recent Labs Lab 09/04/14 0735 09/07/14 0615 09/09/14 0620  ALBUMIN 2.1* 2.1* 2.2*     INFECTIOUS No results for input(s): LATICACIDVEN, PROCALCITON in the last 168 hours.   ENDOCRINE CBG (last 3)  No results for input(s): GLUCAP in the last 72 hours.       IMAGING x48h  -Ir Gastrostomy Tube Mod Sed  09/08/2014   INDICATION: History of Guillain-Barre syndrome complicated by respiratory failure and dysphagia. Request made for placement of a percutaneous gastrostomy tube for enteric nutrition supplementation  EXAM: PULL TROUGH GASTROSTOMY TUBE PLACEMENT  COMPARISON:  CT abdomen pelvis - 08/10/2014  MEDICATIONS: The patient is currently admitted to the hospital and receiving intravenous antibiotics.  Antibiotics were administered within 1 hour of the procedure.  CONTRAST:  20 mL of Isovue 300 administered into the gastric lumen.  ANESTHESIA/SEDATION: Versed 1.5 mg IV; Fentanyl 50 mcg IV  Sedation time  30 minutes  FLUOROSCOPY TIME:  3 minutes (178  mGy)  COMPLICATIONS: None immediate  PROCEDURE: Informed written consent was obtained from the patient's family following explanation of the procedure, risks, benefits and alternatives. A time out was performed prior to the initiation of the procedure. Ultrasound scanning was performed to demarcate the edge of the left lobe of the liver. Maximal barrier sterile technique utilized including caps, mask, sterile gowns, sterile gloves, large sterile drape, hand hygiene and Betadine prep.  The left upper quadrant was sterilely prepped and draped. An oral gastric catheter was inserted into the stomach under fluoroscopy. The existing  nasogastric feeding tube was removed. The left costal margin and air opacified transverse colon were identified and avoided. Air was injected into the stomach for insufflation and visualization under fluoroscopy. Under sterile conditions a 17 gauge trocar needle was utilized to access the stomach percutaneously beneath the left subcostal margin after the overlying soft tissues were anesthetized with 1% Lidocaine with epinephrine. Needle position was confirmed within the stomach with aspiration of air and injection of small amount of contrast. A single T tack was deployed for gastropexy. Over an Amplatz guide wire, a 9-French sheath was inserted into the stomach. A snare device was utilized to capture the oral gastric catheter. The snare device was pulled retrograde from the stomach up the esophagus and out the oropharynx. The 20-French pull-through gastrostomy was connected to the snare device and pulled antegrade through the oropharynx down the esophagus into the stomach and then through the percutaneous tract external to the patient. The gastrostomy was assembled externally. Contrast injection confirms position in the stomach. Several spot radiographic images were obtained in various obliquities for documentation. The patient tolerated procedure well without immediate post procedural complication.  FINDINGS: After successful fluoroscopic guided placement, the gastrostomy tube is appropriately positioned with internal disc against the ventral aspect of the gastric lumen.  IMPRESSION: Successful fluoroscopic insertion of a 20-French pull-through gastrostomy tube.  The gastrostomy may be used immediately for medication administration and in 24 hrs for the initiation of feeds.   Electronically Signed   By: Sandi Mariscal M.D.   On: 09/08/2014 17:21    Cxr 09/08/14- ards infiltrates persist - improved from 08/30/14 - personally viewed and personal opinion.     ASSESSMENT     ARDS (adult respiratory distress syndrome)  cxr  With BASDZ. folllowing GBS  -on PSV. Nearly ready to try ATC. Improved   Anemai of critical illness - Has hx of antibodies  - hgb > 7gm% after PRBC 1 unit 09/07/14  Acute encephalopathyt  - off precedex and normal mental status  Plan: ResP: SBT per protocol Anemia: PRBC only if hgb < 7gm% unless bleeding. 1 unit at a time only Enceph: improved. Monitor off precedex   Dr. Brand Males, M.D., [facility].C.P Pulmonary and Critical Care Medicine Staff Physician [place] [facility] Pager: [phone], If no answer or between  15:00h - 7:00h: call [phone]  09/10/2014 10:59 AM

## 2014-09-11 LAB — RENAL FUNCTION PANEL
ALBUMIN: 2.3 g/dL — AB (ref 3.5–5.0)
ANION GAP: 10 (ref 5–15)
BUN: 50 mg/dL — AB (ref 6–20)
CHLORIDE: 100 mmol/L — AB (ref 101–111)
CO2: 29 mmol/L (ref 22–32)
Calcium: 10 mg/dL (ref 8.9–10.3)
Creatinine, Ser: 9.7 mg/dL — ABNORMAL HIGH (ref 0.61–1.24)
GFR calc non Af Amer: 6 mL/min — ABNORMAL LOW (ref >60)
GFR, EST AFRICAN AMERICAN: 7 mL/min — AB (ref >60)
GLUCOSE: 105 mg/dL — AB (ref 65–99)
Phosphorus: 4.3 mg/dL (ref 2.5–4.6)
Potassium: 3.9 mmol/L (ref 3.5–5.1)
Sodium: 139 mmol/L (ref 135–145)

## 2014-09-11 LAB — CBC
HCT: 23 % — ABNORMAL LOW (ref 39.0–52.0)
Hemoglobin: 7.2 g/dL — ABNORMAL LOW (ref 13.0–17.0)
MCH: 28.3 pg (ref 26.0–34.0)
MCHC: 31.3 g/dL (ref 30.0–36.0)
MCV: 90.6 fL (ref 78.0–100.0)
PLATELETS: 144 10*3/uL — AB (ref 150–400)
RBC: 2.54 MIL/uL — ABNORMAL LOW (ref 4.22–5.81)
RDW: 15.5 % (ref 11.5–15.5)
WBC: 4.9 10*3/uL (ref 4.0–10.5)

## 2014-09-11 LAB — TYPE AND SCREEN
ABO/RH(D): O POS
Antibody Screen: POSITIVE
DAT, IgG: POSITIVE
Unit division: 0
Unit division: 0

## 2014-09-11 LAB — VANCOMYCIN, TROUGH: Vancomycin Tr: 24 ug/mL — ABNORMAL HIGH (ref 10.0–20.0)

## 2014-09-11 NOTE — Progress Notes (Signed)
Subjective:   Scheduled for diaysis later today No acute c/o Watching TV   Objective:  Vital signs in last 24 hours:    162/91  98  17    Weight change:  There were no vitals filed for this visit.  Intake/Output:      Intake/Output this shift:    Post VS: 98.2  106  18  186/105   Physical Exam: General: NAD, critically ill  Head: atraumatic.   ENT: Anicteric,  Neck: Trach  Lungs:  Normal effort, clear ant/lat  Heart: tachycardic  Abdomen:  Soft, nontender, PEG in place  Extremities: trace peripheral edema.  Neurologic: Mildly agitated  Skin: No lesions  Access: Rt arm AVF    Basic Metabolic Panel:  Recent Labs Lab 09/07/14 0615 09/09/14 0620 09/11/14 0600  NA 137 138 139  K 4.0 3.8 3.9  CL 102 102 100*  CO2 25 25 29   GLUCOSE 113* 93 105*  BUN 59* 57* 50*  CREATININE 10.21* 9.57* 9.70*  CALCIUM 9.5 9.8 10.0  PHOS 4.3 3.6 4.3    Liver Function Tests:  Recent Labs Lab 09/07/14 0615 09/09/14 0620 09/11/14 0600  ALBUMIN 2.1* 2.2* 2.3*   No results for input(s): LIPASE, AMYLASE in the last 168 hours. No results for input(s): AMMONIA in the last 168 hours.  CBC:  Recent Labs Lab 09/07/14 0615 09/08/14 0924 09/09/14 0620 09/11/14 0600  WBC 4.4 4.6 4.4 4.9  HGB 6.9* 7.8* 7.7* 7.2*  HCT 21.7* 24.8* 24.3* 23.0*  MCV 89.7 89.5 90.0 90.6  PLT 172 141* 128* 144*    Microbiology: Results for orders placed or performed during the hospital encounter of 08/05/14  Influenza A&B Antigens [facility])     Status: None   Collection Time: 08/05/14  5:49 PM  Result Value Ref Range Status   Micro Text Report   Final       COMMENT                   NEGATIVE FOR INFLUENZA A (ANTIGEN ABSENT)   COMMENT                   NEGATIVE FOR INFLUENZA B (ANTIGEN ABSENT)   ANTIBIOTIC                                                      Culture, blood (single)     Status: None   Collection Time: 08/05/14  6:26 PM  Result Value Ref Range Status   Micro Text Report    Final       COMMENT                   NO GROWTH AEROBICALLY/ANAEROBICALLY IN 5 DAYS   ANTIBIOTIC                                                      Culture, blood (single)     Status: None   Collection Time: 08/05/14  6:41 PM  Result Value Ref Range Status   Micro Text Report   Final       COMMENT  NO GROWTH AEROBICALLY/ANAEROBICALLY IN 5 DAYS   ANTIBIOTIC                                                      Culture, blood (single)     Status: None   Collection Time: 08/05/14 10:15 PM  Result Value Ref Range Status   Micro Text Report   Final       COMMENT                   NO GROWTH AEROBICALLY/ANAEROBICALLY IN 5 DAYS   ANTIBIOTIC                                                      Culture, blood (single)     Status: None   Collection Time: 08/07/14 10:30 AM  Result Value Ref Range Status   Micro Text Report   Final       COMMENT                   NO GROWTH AEROBICALLY/ANAEROBICALLY IN 5 DAYS   ANTIBIOTIC                                                      Culture, blood (single)     Status: None   Collection Time: 08/07/14 10:43 AM  Result Value Ref Range Status   Micro Text Report   Final       COMMENT                   NO GROWTH AEROBICALLY/ANAEROBICALLY IN 5 DAYS   ANTIBIOTIC                                                      Culture, blood (single)     Status: None   Collection Time: 08/10/14  6:43 PM  Result Value Ref Range Status   Micro Text Report   Final       COMMENT                   NO GROWTH AEROBICALLY/ANAEROBICALLY IN 5 DAYS   ANTIBIOTIC                                                      Culture, blood (single)     Status: None   Collection Time: 08/10/14  6:43 PM  Result Value Ref Range Status   Micro Text Report   Final       COMMENT                   NO GROWTH AEROBICALLY/ANAEROBICALLY IN 5 DAYS   ANTIBIOTIC  Culture, blood (single)     Status: None    Collection Time: 08/13/14 12:40 AM  Result Value Ref Range Status   Micro Text Report   Final       COMMENT                   NO GROWTH AEROBICALLY/ANAEROBICALLY IN 5 DAYS   ANTIBIOTIC                                                      Culture, blood (single)     Status: None   Collection Time: 08/13/14 12:50 AM  Result Value Ref Range Status   Micro Text Report   Final       COMMENT                   NO GROWTH AEROBICALLY/ANAEROBICALLY IN 5 DAYS   ANTIBIOTIC                                                      Culture, fungus without smear     Status: None   Collection Time: 08/13/14  8:40 AM  Result Value Ref Range Status   Micro Text Report   Final       SOURCE: BRONCH WASHING    ORGANISM 1                Candida albicans   COMMENT                   -   COMMENT                   -   ANTIBIOTIC                    ORG#1                                               Bronchial Wash Culture     Status: None   Collection Time: 08/13/14  8:40 AM  Result Value Ref Range Status   Micro Text Report   Final       ORGANISM 1                LIGHT GROWTH Candida albicans   COMMENT                   -   ANTIBIOTIC                    ORG#1                                               Influenza A,B,H1N1 - PCR ([facility])     Status: None   Collection Time: 08/15/14  2:20 PM  Result Value Ref Range Status   Influenza A By PCR NEGATIVE NEGATIVE Final   Influenza B By PCR  NEGATIVE NEGATIVE Final   H1N1 flu by pcr NOT DETECTED NOT-DETECTED Final    Comment:                  ----------------------- The Xpert Flu assay (FDA approval for nasal aspirates or washes and nasopharyngeal swab specimens), is intended as an aid in the diagnosis of influenza and should not be used as a sole basis for treatment.   Culture, expectorated sputum-assessment     Status: None   Collection Time: 08/21/14 12:36 PM  Result Value Ref Range Status   Micro Text Report   Final       SOURCE: ETS     COMMENT                   APPEARS TO BE NORMAL FLORA AT 48 HOURS   GRAM STAIN                FEW WHITE BLOOD CELLS RARE GRAM NEGATIVE DIPLOCOCCI   GRAM STAIN                RARE GRAM POSITIVE COCCI   GRAM STAIN                GOOD SPECIMEN-80-90% WBC   GRAM STAIN                RARE GRAM NEGATIVE COCCO-BACILLI   ANTIBIOTIC                                                      Culture, blood (single)     Status: None (Preliminary result)   Collection Time: 08/21/14  3:31 PM  Result Value Ref Range Status   Micro Text Report   Preliminary       COMMENT                   NO GROWTH IN 36 HOURS   ANTIBIOTIC                                                      Culture, blood (single)     Status: None (Preliminary result)   Collection Time: 08/21/14  5:31 PM  Result Value Ref Range Status   Micro Text Report   Preliminary       COMMENT                   NO GROWTH IN 36 HOURS   ANTIBIOTIC                                                      Culture, bal-quantitative     Status: None   Collection Time: 08/25/14  1:48 PM  Result Value Ref Range Status   Specimen Description BRONCHIAL ALVEOLAR LAVAGE  Final   Special Requests NONE  Final   Gram Stain   Final    GOOD SPECIMEN - 80-90% WBCS FEW WBC SEEN RARE GRAM POSITIVE COCCI RARE GRAM NEGATIVE COCCOBACILLI RARE GRAM NEGATIVE COCCI IN  PAIRS    Culture APPEARS TO BE NORMAL FLORA AT 78 HOURS  Final   Report Status 09/04/2014 FINAL  Final    Coagulation Studies: No results for input(s): LABPROT, INR in the last 72 hours.  Urinalysis: No results for input(s): COLORURINE, LABSPEC, PHURINE, GLUCOSEU, HGBUR, BILIRUBINUR, KETONESUR, PROTEINUR, UROBILINOGEN, NITRITE, LEUKOCYTESUR in the last 72 hours.  Invalid input(s): APPERANCEUR    Imaging: No results found.   Medications:       Assessment/ Plan:  39 y.o. AA male with End Stage Renal Disease on MWF schedule with right arm AVF/AVG followed by [facility], hypertension, hyperlipidemia, Guillain-Barre syndrome, admission for fever 08/05/14  1. ESRD: MWF HD: N18.6 [place]. [facility]. - continue to dialyze on schedule MWF.   - Volume status has improved significantly  2. Acute respiratory failure with pneumonia: J96.00, J15.9. S/P tracheostomy. -remains on vent, vent weaning per pulm/cc  3. Anemia of Chronic Kidney Disease: D63.1    continue aranesp 236mcg [place] weekly.  Blood transfusion given this admission  4. Secondary Hyperparathyroidism:N25.81 -phos down to 4.3 with dialysis.   LOS:  Lala Been 5/20/20169:03 AM

## 2014-09-12 LAB — BLOOD GAS, ARTERIAL
ACID-BASE EXCESS: 5.1 mmol/L — AB (ref 0.0–2.0)
Bicarbonate: 29.4 mEq/L — ABNORMAL HIGH (ref 20.0–24.0)
FIO2: 0.35 %
O2 Saturation: 73.4 %
Patient temperature: 99.8
TCO2: 30.9 mmol/L (ref 0–100)
pCO2 arterial: 48 mmHg — ABNORMAL HIGH (ref 35.0–45.0)
pH, Arterial: 7.408 (ref 7.350–7.450)
pO2, Arterial: 41.2 mmHg — ABNORMAL LOW (ref 80.0–100.0)

## 2014-09-14 ENCOUNTER — Other Ambulatory Visit (HOSPITAL_COMMUNITY): Payer: Self-pay

## 2014-09-14 DIAGNOSIS — Z930 Tracheostomy status: Secondary | ICD-10-CM | POA: Diagnosis not present

## 2014-09-14 DIAGNOSIS — J9600 Acute respiratory failure, unspecified whether with hypoxia or hypercapnia: Secondary | ICD-10-CM | POA: Diagnosis not present

## 2014-09-14 LAB — CBC
HCT: 25.3 % — ABNORMAL LOW (ref 39.0–52.0)
Hemoglobin: 7.9 g/dL — ABNORMAL LOW (ref 13.0–17.0)
MCH: 27.9 pg (ref 26.0–34.0)
MCHC: 31.2 g/dL (ref 30.0–36.0)
MCV: 89.4 fL (ref 78.0–100.0)
PLATELETS: 146 10*3/uL — AB (ref 150–400)
RBC: 2.83 MIL/uL — ABNORMAL LOW (ref 4.22–5.81)
RDW: 15.4 % (ref 11.5–15.5)
WBC: 4.2 10*3/uL (ref 4.0–10.5)

## 2014-09-14 LAB — RENAL FUNCTION PANEL
ALBUMIN: 2.6 g/dL — AB (ref 3.5–5.0)
Anion gap: 11 (ref 5–15)
BUN: 56 mg/dL — ABNORMAL HIGH (ref 6–20)
CALCIUM: 9.5 mg/dL (ref 8.9–10.3)
CO2: 27 mmol/L (ref 22–32)
CREATININE: 11.32 mg/dL — AB (ref 0.61–1.24)
Chloride: 98 mmol/L — ABNORMAL LOW (ref 101–111)
GFR calc Af Amer: 6 mL/min — ABNORMAL LOW (ref >60)
GFR, EST NON AFRICAN AMERICAN: 5 mL/min — AB (ref >60)
GLUCOSE: 106 mg/dL — AB (ref 65–99)
POTASSIUM: 3.5 mmol/L (ref 3.5–5.1)
Phosphorus: 4.3 mg/dL (ref 2.5–4.6)
Sodium: 136 mmol/L (ref 135–145)

## 2014-09-14 NOTE — Progress Notes (Addendum)
Subjective:  Patient seen during dialysis Tolerating well  BFR 400. DFR 800. No acute c/o Watching TV   Objective:  Vital signs in last 24 hours:    98.8  15  81  131/87  Weight change:  There were no vitals filed for this visit.  Intake/Output:      Intake/Output this shift:    Physical Exam: General: NAD, critically ill  Head: atraumatic.   ENT: Anicteric,  Neck: Trach- capped  Lungs:  Normal effort,   Heart: tachycardic  Abdomen:  Soft, nontender, PEG in place  Extremities: trace peripheral edema.  Neurologic: Watching TV.   Skin: No lesions  Access: Rt arm AVF    Basic Metabolic Panel:  Recent Labs Lab 09/09/14 0620 09/11/14 0600 09/14/14 0710  NA 138 139 136  K 3.8 3.9 3.5  CL 102 100* 98*  CO2 25 29 27   GLUCOSE 93 105* 106*  BUN 57* 50* 56*  CREATININE 9.57* 9.70* 11.32*  CALCIUM 9.8 10.0 9.5  PHOS 3.6 4.3 4.3    Liver Function Tests:  Recent Labs Lab 09/09/14 0620 09/11/14 0600 09/14/14 0710  ALBUMIN 2.2* 2.3* 2.6*   No results for input(s): LIPASE, AMYLASE in the last 168 hours. No results for input(s): AMMONIA in the last 168 hours.  CBC:  Recent Labs Lab 09/08/14 0924 09/09/14 0620 09/11/14 0600 09/14/14 0710  WBC 4.6 4.4 4.9 4.2  HGB 7.8* 7.7* 7.2* 7.9*  HCT 24.8* 24.3* 23.0* 25.3*  MCV 89.5 90.0 90.6 89.4  PLT 141* 128* 144* 146*    Microbiology: Results for orders placed or performed during the hospital encounter of 08/05/14  Influenza A&B Antigens [facility])     Status: None   Collection Time: 08/05/14  5:49 PM  Result Value Ref Range Status   Micro Text Report   Final       COMMENT                   NEGATIVE FOR INFLUENZA A (ANTIGEN ABSENT)   COMMENT                   NEGATIVE FOR INFLUENZA B (ANTIGEN ABSENT)   ANTIBIOTIC                                                      Culture, blood (single)     Status: None   Collection Time: 08/05/14  6:26 PM  Result Value Ref Range Status   Micro Text Report   Final       COMMENT                   NO GROWTH AEROBICALLY/ANAEROBICALLY IN 5 DAYS   ANTIBIOTIC                                                      Culture, blood (single)     Status: None   Collection Time: 08/05/14  6:41 PM  Result Value Ref Range Status   Micro Text Report   Final       COMMENT  NO GROWTH AEROBICALLY/ANAEROBICALLY IN 5 DAYS   ANTIBIOTIC                                                      Culture, blood (single)     Status: None   Collection Time: 08/05/14 10:15 PM  Result Value Ref Range Status   Micro Text Report   Final       COMMENT                   NO GROWTH AEROBICALLY/ANAEROBICALLY IN 5 DAYS   ANTIBIOTIC                                                      Culture, blood (single)     Status: None   Collection Time: 08/07/14 10:30 AM  Result Value Ref Range Status   Micro Text Report   Final       COMMENT                   NO GROWTH AEROBICALLY/ANAEROBICALLY IN 5 DAYS   ANTIBIOTIC                                                      Culture, blood (single)     Status: None   Collection Time: 08/07/14 10:43 AM  Result Value Ref Range Status   Micro Text Report   Final       COMMENT                   NO GROWTH AEROBICALLY/ANAEROBICALLY IN 5 DAYS   ANTIBIOTIC                                                      Culture, blood (single)     Status: None   Collection Time: 08/10/14  6:43 PM  Result Value Ref Range Status   Micro Text Report   Final       COMMENT                   NO GROWTH AEROBICALLY/ANAEROBICALLY IN 5 DAYS   ANTIBIOTIC                                                      Culture, blood (single)     Status: None   Collection Time: 08/10/14  6:43 PM  Result Value Ref Range Status   Micro Text Report   Final       COMMENT                   NO GROWTH AEROBICALLY/ANAEROBICALLY IN 5 DAYS   ANTIBIOTIC  Culture, blood (single)     Status: None   Collection  Time: 08/13/14 12:40 AM  Result Value Ref Range Status   Micro Text Report   Final       COMMENT                   NO GROWTH AEROBICALLY/ANAEROBICALLY IN 5 DAYS   ANTIBIOTIC                                                      Culture, blood (single)     Status: None   Collection Time: 08/13/14 12:50 AM  Result Value Ref Range Status   Micro Text Report   Final       COMMENT                   NO GROWTH AEROBICALLY/ANAEROBICALLY IN 5 DAYS   ANTIBIOTIC                                                      Culture, fungus without smear     Status: None   Collection Time: 08/13/14  8:40 AM  Result Value Ref Range Status   Micro Text Report   Final       SOURCE: BRONCH WASHING    ORGANISM 1                Candida albicans   COMMENT                   -   COMMENT                   -   ANTIBIOTIC                    ORG#1                                               Bronchial Wash Culture     Status: None   Collection Time: 08/13/14  8:40 AM  Result Value Ref Range Status   Micro Text Report   Final       ORGANISM 1                LIGHT GROWTH Candida albicans   COMMENT                   -   ANTIBIOTIC                    ORG#1                                               Influenza A,B,H1N1 - PCR ([facility])     Status: None   Collection Time: 08/15/14  2:20 PM  Result Value Ref Range Status   Influenza A By PCR NEGATIVE NEGATIVE Final   Influenza B By PCR  NEGATIVE NEGATIVE Final   H1N1 flu by pcr NOT DETECTED NOT-DETECTED Final    Comment:                  ----------------------- The Xpert Flu assay (FDA approval for nasal aspirates or washes and nasopharyngeal swab specimens), is intended as an aid in the diagnosis of influenza and should not be used as a sole basis for treatment.   Culture, expectorated sputum-assessment     Status: None   Collection Time: 08/21/14 12:36 PM  Result Value Ref Range Status   Micro Text Report   Final       SOURCE: ETS    COMMENT                    APPEARS TO BE NORMAL FLORA AT 48 HOURS   GRAM STAIN                FEW WHITE BLOOD CELLS RARE GRAM NEGATIVE DIPLOCOCCI   GRAM STAIN                RARE GRAM POSITIVE COCCI   GRAM STAIN                GOOD SPECIMEN-80-90% WBC   GRAM STAIN                RARE GRAM NEGATIVE COCCO-BACILLI   ANTIBIOTIC                                                      Culture, blood (single)     Status: None (Preliminary result)   Collection Time: 08/21/14  3:31 PM  Result Value Ref Range Status   Micro Text Report   Preliminary       COMMENT                   NO GROWTH IN 36 HOURS   ANTIBIOTIC                                                      Culture, blood (single)     Status: None (Preliminary result)   Collection Time: 08/21/14  5:31 PM  Result Value Ref Range Status   Micro Text Report   Preliminary       COMMENT                   NO GROWTH IN 36 HOURS   ANTIBIOTIC                                                      Culture, bal-quantitative     Status: None   Collection Time: 08/25/14  1:48 PM  Result Value Ref Range Status   Specimen Description BRONCHIAL ALVEOLAR LAVAGE  Final   Special Requests NONE  Final   Gram Stain   Final    GOOD SPECIMEN - 80-90% WBCS FEW WBC SEEN RARE GRAM POSITIVE COCCI RARE GRAM NEGATIVE COCCOBACILLI RARE GRAM NEGATIVE COCCI IN  PAIRS    Culture APPEARS TO BE NORMAL FLORA AT 2 HOURS  Final   Report Status 09/04/2014 FINAL  Final    Coagulation Studies: No results for input(s): LABPROT, INR in the last 72 hours.  Urinalysis: No results for input(s): COLORURINE, LABSPEC, PHURINE, GLUCOSEU, HGBUR, BILIRUBINUR, KETONESUR, PROTEINUR, UROBILINOGEN, NITRITE, LEUKOCYTESUR in the last 72 hours.  Invalid input(s): APPERANCEUR    Imaging: No results found.   Medications:       Assessment/ Plan:  39 y.o. AA male with End Stage Renal Disease on MWF schedule with right arm AVF/AVG followed by [facility] [address],  hypertension, hyperlipidemia, Guillain-Barre syndrome, admission for fever 08/05/14  1. ESRD: MWF HD: N18.6 [place]. [facility]. - continue to dialyze on schedule MWF.   - Volume status has improved significantly  2. Acute respiratory failure with pneumonia: J96.00, J15.9. S/P tracheostomy. - trach capped at present  3. Anemia of Chronic Kidney Disease: D63.1    continue aranesp 248mcg [place] weekly.  Blood transfusion given this admission  4. Secondary Hyperparathyroidism:N25.81 -phos down to 4.3 with dialysis.   LOS:  Murlean Iba 5/23/20163:29 PM

## 2014-09-14 NOTE — Progress Notes (Signed)
   Name: Patrick Downs MRN: [id number] DOB: Aug 04, 1975    ADMISSION DATE:  08/26/2014 CONSULTATION DATE:  5/5  REFERRING MD :  [facility]  CHIEF COMPLAINT:  Resp failure  BRIEF PATIENT DESCRIPTION: 39 y/o AAM with recent admit related to GBS, ARDS 2/2 aspiration, volume overload, ESRD on vent   SUBJECTIVE/OVERNIGHT/INTERVAL HX Cmfortable on ATC   VITAL SIGNS:   PHYSICAL EXAMINATION: General: NAD, comfortable on ATC Neuro: RASS 0, + F/C HEENT:  WNL Cardiovascular: Reg, no M Lungs: clear Abdomen:  Soft, NABS Ext: no edema    PULMONARY  Recent Labs Lab 09/12/14 1305  PHART 7.408  PCO2ART 48.0*  PO2ART 41.2*  HCO3 29.4*  TCO2 30.9  O2SAT 73.4    CBC  Recent Labs Lab 09/09/14 0620 09/11/14 0600 09/14/14 0710  HGB 7.7* 7.2* 7.9*  HCT 24.3* 23.0* 25.3*  WBC 4.4 4.9 4.2  PLT 128* 144* 146*    COAGULATION No results for input(s): INR in the last 168 hours.  CARDIAC  No results for input(s): TROPONINI in the last 168 hours. No results for input(s): PROBNP in the last 168 hours.   CHEMISTRY  Recent Labs Lab 09/09/14 0620 09/11/14 0600 09/14/14 0710  NA 138 139 136  K 3.8 3.9 3.5  CL 102 100* 98*  CO2 25 29 27   GLUCOSE 93 105* 106*  BUN 57* 50* 56*  CREATININE 9.57* 9.70* 11.32*  CALCIUM 9.8 10.0 9.5  PHOS 3.6 4.3 4.3   CrCl cannot be calculated (Unknown ideal weight.).   LIVER  Recent Labs Lab 09/09/14 0620 09/11/14 0600 09/14/14 0710  ALBUMIN 2.2* 2.3* 2.6*     INFECTIOUS No results for input(s): LATICACIDVEN, PROCALCITON in the last 168 hours.   ENDOCRINE CBG (last 3)  No results for input(s): GLUCAP in the last 72 hours.  No new CXR   ASSESSMENT  Resolving ARDS Trach status  Plan: Cont weaning in ATC as tolerated Rest of medical mgmt per primary team Discussed with Dr Delton See, MD ; [facility] service Mobile [phone].  After 5:30 PM or weekends, call [phone]   09/14/2014 2:37 PM

## 2014-09-15 ENCOUNTER — Other Ambulatory Visit (HOSPITAL_COMMUNITY): Payer: Self-pay

## 2014-09-16 LAB — CBC
HCT: 23.9 % — ABNORMAL LOW (ref 39.0–52.0)
HEMOGLOBIN: 7.6 g/dL — AB (ref 13.0–17.0)
MCH: 27.9 pg (ref 26.0–34.0)
MCHC: 31.8 g/dL (ref 30.0–36.0)
MCV: 87.9 fL (ref 78.0–100.0)
Platelets: 165 10*3/uL (ref 150–400)
RBC: 2.72 MIL/uL — ABNORMAL LOW (ref 4.22–5.81)
RDW: 15.5 % (ref 11.5–15.5)
WBC: 5 10*3/uL (ref 4.0–10.5)

## 2014-09-16 LAB — RENAL FUNCTION PANEL
ALBUMIN: 2.7 g/dL — AB (ref 3.5–5.0)
ANION GAP: 12 (ref 5–15)
BUN: 55 mg/dL — ABNORMAL HIGH (ref 6–20)
CALCIUM: 9.2 mg/dL (ref 8.9–10.3)
CHLORIDE: 96 mmol/L — AB (ref 101–111)
CO2: 28 mmol/L (ref 22–32)
CREATININE: 9.93 mg/dL — AB (ref 0.61–1.24)
GFR calc non Af Amer: 6 mL/min — ABNORMAL LOW (ref >60)
GFR, EST AFRICAN AMERICAN: 7 mL/min — AB (ref >60)
GLUCOSE: 99 mg/dL (ref 65–99)
PHOSPHORUS: 4.1 mg/dL (ref 2.5–4.6)
Potassium: 3.7 mmol/L (ref 3.5–5.1)
Sodium: 136 mmol/L (ref 135–145)

## 2014-09-16 NOTE — Progress Notes (Signed)
Subjective:  Patient seen prior to HD. Doing well at the moment.  No acute complaints.  Objective:  Vital signs in last 24 hours:  98.5 84 13 157/98  Weight change:  There were no vitals filed for this visit.  Intake/Output:      Intake/Output this shift:    Physical Exam: General: NAD  Head: Atraumatic. OM moist   ENT: Anicteric  Neck: Trach in place on T piece  Lungs:  Normal effort, CTAB  Heart: S1S2 no rubs  Abdomen:  Soft, nontender, PEG in place  Extremities: trace peripheral edema.  Neurologic: Awake, alert, follows commands   Skin: No lesions  Access: Rt arm AVF    Basic Metabolic Panel:  Recent Labs Lab 09/11/14 0600 09/14/14 0710  NA 139 136  K 3.9 3.5  CL 100* 98*  CO2 29 27  GLUCOSE 105* 106*  BUN 50* 56*  CREATININE 9.70* 11.32*  CALCIUM 10.0 9.5  PHOS 4.3 4.3    Liver Function Tests:  Recent Labs Lab 09/11/14 0600 09/14/14 0710  ALBUMIN 2.3* 2.6*   No results for input(s): LIPASE, AMYLASE in the last 168 hours. No results for input(s): AMMONIA in the last 168 hours.  CBC:  Recent Labs Lab 09/11/14 0600 09/14/14 0710  WBC 4.9 4.2  HGB 7.2* 7.9*  HCT 23.0* 25.3*  MCV 90.6 89.4  PLT 144* 146*    Microbiology: Results for orders placed or performed during the hospital encounter of 08/05/14  Influenza A&B Antigens [facility])     Status: None   Collection Time: 08/05/14  5:49 PM  Result Value Ref Range Status   Micro Text Report   Final       COMMENT                   NEGATIVE FOR INFLUENZA A (ANTIGEN ABSENT)   COMMENT                   NEGATIVE FOR INFLUENZA B (ANTIGEN ABSENT)   ANTIBIOTIC                                                      Culture, blood (single)     Status: None   Collection Time: 08/05/14  6:26 PM  Result Value Ref Range Status   Micro Text Report   Final       COMMENT                   NO GROWTH AEROBICALLY/ANAEROBICALLY IN 5 DAYS   ANTIBIOTIC                                                       Culture, blood (single)     Status: None   Collection Time: 08/05/14  6:41 PM  Result Value Ref Range Status   Micro Text Report   Final       COMMENT                   NO GROWTH AEROBICALLY/ANAEROBICALLY IN 5 DAYS   ANTIBIOTIC  Culture, blood (single)     Status: None   Collection Time: 08/05/14 10:15 PM  Result Value Ref Range Status   Micro Text Report   Final       COMMENT                   NO GROWTH AEROBICALLY/ANAEROBICALLY IN 5 DAYS   ANTIBIOTIC                                                      Culture, blood (single)     Status: None   Collection Time: 08/07/14 10:30 AM  Result Value Ref Range Status   Micro Text Report   Final       COMMENT                   NO GROWTH AEROBICALLY/ANAEROBICALLY IN 5 DAYS   ANTIBIOTIC                                                      Culture, blood (single)     Status: None   Collection Time: 08/07/14 10:43 AM  Result Value Ref Range Status   Micro Text Report   Final       COMMENT                   NO GROWTH AEROBICALLY/ANAEROBICALLY IN 5 DAYS   ANTIBIOTIC                                                      Culture, blood (single)     Status: None   Collection Time: 08/10/14  6:43 PM  Result Value Ref Range Status   Micro Text Report   Final       COMMENT                   NO GROWTH AEROBICALLY/ANAEROBICALLY IN 5 DAYS   ANTIBIOTIC                                                      Culture, blood (single)     Status: None   Collection Time: 08/10/14  6:43 PM  Result Value Ref Range Status   Micro Text Report   Final       COMMENT                   NO GROWTH AEROBICALLY/ANAEROBICALLY IN 5 DAYS   ANTIBIOTIC                                                      Culture, blood (single)     Status: None   Collection Time:  08/13/14 12:40 AM  Result Value Ref Range Status   Micro Text Report   Final       COMMENT                   NO GROWTH  AEROBICALLY/ANAEROBICALLY IN 5 DAYS   ANTIBIOTIC                                                      Culture, blood (single)     Status: None   Collection Time: 08/13/14 12:50 AM  Result Value Ref Range Status   Micro Text Report   Final       COMMENT                   NO GROWTH AEROBICALLY/ANAEROBICALLY IN 5 DAYS   ANTIBIOTIC                                                      Culture, fungus without smear     Status: None   Collection Time: 08/13/14  8:40 AM  Result Value Ref Range Status   Micro Text Report   Final       SOURCE: BRONCH WASHING    ORGANISM 1                Candida albicans   COMMENT                   -   COMMENT                   -   ANTIBIOTIC                    ORG#1                                               Bronchial Wash Culture     Status: None   Collection Time: 08/13/14  8:40 AM  Result Value Ref Range Status   Micro Text Report   Final       ORGANISM 1                LIGHT GROWTH Candida albicans   COMMENT                   -   ANTIBIOTIC                    ORG#1                                               Influenza A,B,H1N1 - PCR ([facility])     Status: None   Collection Time: 08/15/14  2:20 PM  Result Value Ref Range Status   Influenza A By PCR NEGATIVE NEGATIVE Final   Influenza B By PCR NEGATIVE NEGATIVE Final   H1N1 flu by pcr NOT DETECTED NOT-DETECTED Final  Comment:                  ----------------------- The Xpert Flu assay (FDA approval for nasal aspirates or washes and nasopharyngeal swab specimens), is intended as an aid in the diagnosis of influenza and should not be used as a sole basis for treatment.   Culture, expectorated sputum-assessment     Status: None   Collection Time: 08/21/14 12:36 PM  Result Value Ref Range Status   Micro Text Report   Final       SOURCE: ETS    COMMENT                   APPEARS TO BE NORMAL FLORA AT 48 HOURS   GRAM STAIN                FEW WHITE BLOOD CELLS RARE GRAM NEGATIVE  DIPLOCOCCI   GRAM STAIN                RARE GRAM POSITIVE COCCI   GRAM STAIN                GOOD SPECIMEN-80-90% WBC   GRAM STAIN                RARE GRAM NEGATIVE COCCO-BACILLI   ANTIBIOTIC                                                      Culture, blood (single)     Status: None (Preliminary result)   Collection Time: 08/21/14  3:31 PM  Result Value Ref Range Status   Micro Text Report   Preliminary       COMMENT                   NO GROWTH IN 36 HOURS   ANTIBIOTIC                                                      Culture, blood (single)     Status: None (Preliminary result)   Collection Time: 08/21/14  5:31 PM  Result Value Ref Range Status   Micro Text Report   Preliminary       COMMENT                   NO GROWTH IN 36 HOURS   ANTIBIOTIC                                                      Culture, bal-quantitative     Status: None   Collection Time: 08/25/14  1:48 PM  Result Value Ref Range Status   Specimen Description BRONCHIAL ALVEOLAR LAVAGE  Final   Special Requests NONE  Final   Gram Stain   Final    GOOD SPECIMEN - 80-90% WBCS FEW WBC SEEN RARE GRAM POSITIVE COCCI RARE GRAM NEGATIVE COCCOBACILLI RARE GRAM NEGATIVE COCCI IN PAIRS    Culture APPEARS TO BE NORMAL FLORA AT 48 HOURS  Final  Report Status 09/04/2014 FINAL  Final    Coagulation Studies: No results for input(s): LABPROT, INR in the last 72 hours.  Urinalysis: No results for input(s): COLORURINE, LABSPEC, PHURINE, GLUCOSEU, HGBUR, BILIRUBINUR, KETONESUR, PROTEINUR, UROBILINOGEN, NITRITE, LEUKOCYTESUR in the last 72 hours.  Invalid input(s): APPERANCEUR    Imaging: No results found.   Medications:       Assessment/ Plan:  39 y.o. AA male with End Stage Renal Disease on MWF schedule with right arm AVF/AVG followed by [facility] Nephrology/[facility] [address], hypertension, hyperlipidemia, Guillain-Barre syndrome, admission for fever 08/05/14  1. ESRD: MWF HD: N18.6 [place]  Nephrology. - Pt seen predialysis today, orders prepared, will continue HD on MWF schedule while here.  2. Acute respiratory failure with pneumonia: J96.00, J15.9. S/P tracheostomy. - On T piece this AM, but has been off ventilator, progressing well.  3. Anemia of Chronic Kidney Disease: D63.1   last hgb 7.6, drifting down, consider transfusion again if hgb 7 or less, continue aranesp for now.  4. Secondary Hyperparathyroidism:N25.81 -last phos 4.3, continue to monitor bone mineral metabolism periodically.   LOS:  Manas Hickling 5/25/20166:23 AM

## 2014-09-18 LAB — RENAL FUNCTION PANEL
ANION GAP: 12 (ref 5–15)
Albumin: 3 g/dL — ABNORMAL LOW (ref 3.5–5.0)
BUN: 52 mg/dL — AB (ref 6–20)
CALCIUM: 9.8 mg/dL (ref 8.9–10.3)
CO2: 29 mmol/L (ref 22–32)
CREATININE: 9.75 mg/dL — AB (ref 0.61–1.24)
Chloride: 98 mmol/L — ABNORMAL LOW (ref 101–111)
GFR calc Af Amer: 7 mL/min — ABNORMAL LOW (ref >60)
GFR, EST NON AFRICAN AMERICAN: 6 mL/min — AB (ref >60)
Glucose, Bld: 115 mg/dL — ABNORMAL HIGH (ref 65–99)
POTASSIUM: 4 mmol/L (ref 3.5–5.1)
Phosphorus: 4.3 mg/dL (ref 2.5–4.6)
Sodium: 139 mmol/L (ref 135–145)

## 2014-09-18 LAB — CBC
HEMATOCRIT: 26.8 % — AB (ref 39.0–52.0)
HEMOGLOBIN: 8.1 g/dL — AB (ref 13.0–17.0)
MCH: 26.6 pg (ref 26.0–34.0)
MCHC: 30.2 g/dL (ref 30.0–36.0)
MCV: 87.9 fL (ref 78.0–100.0)
PLATELETS: 155 10*3/uL (ref 150–400)
RBC: 3.05 MIL/uL — ABNORMAL LOW (ref 4.22–5.81)
RDW: 15.4 % (ref 11.5–15.5)
WBC: 4.9 10*3/uL (ref 4.0–10.5)

## 2014-09-18 NOTE — Progress Notes (Signed)
Subjective:  Patient moved to another room. Laying in bed. No acute complaints.  Objective:  Vital signs in last 24 hours:  97 75 20 165/88  Weight change:  There were no vitals filed for this visit.  Intake/Output:      Intake/Output this shift:    Physical Exam: General: NAD  Head: Atraumatic. OM moist   ENT: Anicteric  Neck: Trach in place, PM valve in place  Lungs:  Normal effort, CTAB  Heart: S1S2 no rubs  Abdomen:  Soft, nontender, PEG in place  Extremities: trace peripheral edema.  Neurologic: Awake, alert, follows commands   Skin: No lesions  Access: Rt arm AVF    Basic Metabolic Panel:  Recent Labs Lab 09/14/14 0710 09/16/14 0500 09/18/14 0641  NA 136 136 139  K 3.5 3.7 4.0  CL 98* 96* 98*  CO2 27 28 29   GLUCOSE 106* 99 115*  BUN 56* 55* 52*  CREATININE 11.32* 9.93* 9.75*  CALCIUM 9.5 9.2 9.8  PHOS 4.3 4.1 4.3    Liver Function Tests:  Recent Labs Lab 09/14/14 0710 09/16/14 0500 09/18/14 0641  ALBUMIN 2.6* 2.7* 3.0*   No results for input(s): LIPASE, AMYLASE in the last 168 hours. No results for input(s): AMMONIA in the last 168 hours.  CBC:  Recent Labs Lab 09/14/14 0710 09/16/14 0500 09/18/14 0640  WBC 4.2 5.0 4.9  HGB 7.9* 7.6* 8.1*  HCT 25.3* 23.9* 26.8*  MCV 89.4 87.9 87.9  PLT 146* 165 155    Microbiology: Results for orders placed or performed during the hospital encounter of 08/05/14  Influenza A&B Antigens [facility])     Status: None   Collection Time: 08/05/14  5:49 PM  Result Value Ref Range Status   Micro Text Report   Final       COMMENT                   NEGATIVE FOR INFLUENZA A (ANTIGEN ABSENT)   COMMENT                   NEGATIVE FOR INFLUENZA B (ANTIGEN ABSENT)   ANTIBIOTIC                                                      Culture, blood (single)     Status: None   Collection Time: 08/05/14  6:26 PM  Result Value Ref Range Status   Micro Text Report   Final       COMMENT                   NO GROWTH  AEROBICALLY/ANAEROBICALLY IN 5 DAYS   ANTIBIOTIC                                                      Culture, blood (single)     Status: None   Collection Time: 08/05/14  6:41 PM  Result Value Ref Range Status   Micro Text Report   Final       COMMENT                   NO GROWTH AEROBICALLY/ANAEROBICALLY IN 5 DAYS  ANTIBIOTIC                                                      Culture, blood (single)     Status: None   Collection Time: 08/05/14 10:15 PM  Result Value Ref Range Status   Micro Text Report   Final       COMMENT                   NO GROWTH AEROBICALLY/ANAEROBICALLY IN 5 DAYS   ANTIBIOTIC                                                      Culture, blood (single)     Status: None   Collection Time: 08/07/14 10:30 AM  Result Value Ref Range Status   Micro Text Report   Final       COMMENT                   NO GROWTH AEROBICALLY/ANAEROBICALLY IN 5 DAYS   ANTIBIOTIC                                                      Culture, blood (single)     Status: None   Collection Time: 08/07/14 10:43 AM  Result Value Ref Range Status   Micro Text Report   Final       COMMENT                   NO GROWTH AEROBICALLY/ANAEROBICALLY IN 5 DAYS   ANTIBIOTIC                                                      Culture, blood (single)     Status: None   Collection Time: 08/10/14  6:43 PM  Result Value Ref Range Status   Micro Text Report   Final       COMMENT                   NO GROWTH AEROBICALLY/ANAEROBICALLY IN 5 DAYS   ANTIBIOTIC                                                      Culture, blood (single)     Status: None   Collection Time: 08/10/14  6:43 PM  Result Value Ref Range Status   Micro Text Report   Final       COMMENT                   NO GROWTH AEROBICALLY/ANAEROBICALLY IN 5 DAYS   ANTIBIOTIC  Culture, blood (single)     Status: None   Collection Time: 08/13/14 12:40 AM  Result Value  Ref Range Status   Micro Text Report   Final       COMMENT                   NO GROWTH AEROBICALLY/ANAEROBICALLY IN 5 DAYS   ANTIBIOTIC                                                      Culture, blood (single)     Status: None   Collection Time: 08/13/14 12:50 AM  Result Value Ref Range Status   Micro Text Report   Final       COMMENT                   NO GROWTH AEROBICALLY/ANAEROBICALLY IN 5 DAYS   ANTIBIOTIC                                                      Culture, fungus without smear     Status: None   Collection Time: 08/13/14  8:40 AM  Result Value Ref Range Status   Micro Text Report   Final       SOURCE: BRONCH WASHING    ORGANISM 1                Candida albicans   COMMENT                   -   COMMENT                   -   ANTIBIOTIC                    ORG#1                                               Bronchial Wash Culture     Status: None   Collection Time: 08/13/14  8:40 AM  Result Value Ref Range Status   Micro Text Report   Final       ORGANISM 1                LIGHT GROWTH Candida albicans   COMMENT                   -   ANTIBIOTIC                    ORG#1                                               Influenza A,B,H1N1 - PCR ([facility])     Status: None   Collection Time: 08/15/14  2:20 PM  Result Value Ref Range Status   Influenza A By PCR NEGATIVE NEGATIVE Final   Influenza B By PCR  NEGATIVE NEGATIVE Final   H1N1 flu by pcr NOT DETECTED NOT-DETECTED Final    Comment:                  ----------------------- The Xpert Flu assay (FDA approval for nasal aspirates or washes and nasopharyngeal swab specimens), is intended as an aid in the diagnosis of influenza and should not be used as a sole basis for treatment.   Culture, expectorated sputum-assessment     Status: None   Collection Time: 08/21/14 12:36 PM  Result Value Ref Range Status   Micro Text Report   Final       SOURCE: ETS    COMMENT                   APPEARS TO BE NORMAL  FLORA AT 48 HOURS   GRAM STAIN                FEW WHITE BLOOD CELLS RARE GRAM NEGATIVE DIPLOCOCCI   GRAM STAIN                RARE GRAM POSITIVE COCCI   GRAM STAIN                GOOD SPECIMEN-80-90% WBC   GRAM STAIN                RARE GRAM NEGATIVE COCCO-BACILLI   ANTIBIOTIC                                                      Culture, blood (single)     Status: None (Preliminary result)   Collection Time: 08/21/14  3:31 PM  Result Value Ref Range Status   Micro Text Report   Preliminary       COMMENT                   NO GROWTH IN 36 HOURS   ANTIBIOTIC                                                      Culture, blood (single)     Status: None (Preliminary result)   Collection Time: 08/21/14  5:31 PM  Result Value Ref Range Status   Micro Text Report   Preliminary       COMMENT                   NO GROWTH IN 36 HOURS   ANTIBIOTIC                                                      Culture, bal-quantitative     Status: None   Collection Time: 08/25/14  1:48 PM  Result Value Ref Range Status   Specimen Description BRONCHIAL ALVEOLAR LAVAGE  Final   Special Requests NONE  Final   Gram Stain   Final    GOOD SPECIMEN - 80-90% WBCS FEW WBC SEEN RARE GRAM POSITIVE COCCI RARE GRAM NEGATIVE COCCOBACILLI RARE GRAM NEGATIVE COCCI IN  PAIRS    Culture APPEARS TO BE NORMAL FLORA AT 21 HOURS  Final   Report Status 09/04/2014 FINAL  Final    Coagulation Studies: No results for input(s): LABPROT, INR in the last 72 hours.  Urinalysis: No results for input(s): COLORURINE, LABSPEC, PHURINE, GLUCOSEU, HGBUR, BILIRUBINUR, KETONESUR, PROTEINUR, UROBILINOGEN, NITRITE, LEUKOCYTESUR in the last 72 hours.  Invalid input(s): APPERANCEUR    Imaging: No results found.   Medications:       Assessment/ Plan:  39 y.o. AA male with End Stage Renal Disease on MWF schedule with right arm AVF/AVG followed by [facility]ph[facility] [address], hypertension, hyperlipidemia, Guillain-Barre  syndrome, admission for fever 08/05/14  1. ESRD: MWF HD: N18.6 [place]. [facility] Nephrology. - Pt due for HD today, orders have been prepared, will plan for HD again on Monday thereafter.  2. Acute respiratory failure with pneumonia: J96.00, J15.9. S/P tracheostomy. - PM valve in place this AM, breathing comfortably, off the vent.  3. Anemia of Chronic Kidney Disease: D63.1   hgb up to 8.1,  continue aranesp.  4. Secondary Hyperparathyroidism:N25.81 -Phos 4.3 today, continue to monitor bone mineral metabolism parameters.    Nezzie Manera 5/27/20169:10 AM

## 2014-09-21 ENCOUNTER — Other Ambulatory Visit (HOSPITAL_COMMUNITY): Payer: Medicare Other

## 2014-09-21 LAB — CBC
HEMATOCRIT: 28.2 % — AB (ref 39.0–52.0)
HEMOGLOBIN: 8.6 g/dL — AB (ref 13.0–17.0)
MCH: 26.1 pg (ref 26.0–34.0)
MCHC: 30.5 g/dL (ref 30.0–36.0)
MCV: 85.5 fL (ref 78.0–100.0)
Platelets: 168 10*3/uL (ref 150–400)
RBC: 3.3 MIL/uL — ABNORMAL LOW (ref 4.22–5.81)
RDW: 15.2 % (ref 11.5–15.5)
WBC: 5 10*3/uL (ref 4.0–10.5)

## 2014-09-21 LAB — RENAL FUNCTION PANEL
Albumin: 3 g/dL — ABNORMAL LOW (ref 3.5–5.0)
Anion gap: 11 (ref 5–15)
BUN: 59 mg/dL — ABNORMAL HIGH (ref 6–20)
CALCIUM: 9.7 mg/dL (ref 8.9–10.3)
CO2: 28 mmol/L (ref 22–32)
CREATININE: 11.3 mg/dL — AB (ref 0.61–1.24)
Chloride: 96 mmol/L — ABNORMAL LOW (ref 101–111)
GFR calc Af Amer: 6 mL/min — ABNORMAL LOW (ref >60)
GFR calc non Af Amer: 5 mL/min — ABNORMAL LOW (ref >60)
GLUCOSE: 145 mg/dL — AB (ref 65–99)
Phosphorus: 3.6 mg/dL (ref 2.5–4.6)
Potassium: 5 mmol/L (ref 3.5–5.1)
Sodium: 135 mmol/L (ref 135–145)

## 2014-09-21 NOTE — Progress Notes (Signed)
Subjective:  Pt completed HD today, UF achieved was 2kg. Pt readily communicating now.  Objective:  Vital signs in last 24 hours:  97.4 84 20 133/87  Weight change:  There were no vitals filed for this visit.  Intake/Output:      Intake/Output this shift:    Physical Exam: General: NAD  Head: Atraumatic. OM moist   ENT: Anicteric  Neck: Trach in place, PM valve in place  Lungs:  Normal effort, CTAB  Heart: S1S2 no rubs  Abdomen:  Soft, nontender, PEG in place  Extremities: trace peripheral edema.  Neurologic: Awake, alert, follows commands   Skin: No rash  Access: Rt arm AVF    Basic Metabolic Panel:  Recent Labs Lab 09/16/14 0500 09/18/14 0641 09/21/14 0508  NA 136 139 135  K 3.7 4.0 5.0  CL 96* 98* 96*  CO2 28 29 28   GLUCOSE 99 115* 145*  BUN 55* 52* 59*  CREATININE 9.93* 9.75* 11.30*  CALCIUM 9.2 9.8 9.7  PHOS 4.1 4.3 3.6    Liver Function Tests:  Recent Labs Lab 09/16/14 0500 09/18/14 0641 09/21/14 0508  ALBUMIN 2.7* 3.0* 3.0*   No results for input(s): LIPASE, AMYLASE in the last 168 hours. No results for input(s): AMMONIA in the last 168 hours.  CBC:  Recent Labs Lab 09/16/14 0500 09/18/14 0640 09/21/14 0508  WBC 5.0 4.9 5.0  HGB 7.6* 8.1* 8.6*  HCT 23.9* 26.8* 28.2*  MCV 87.9 87.9 85.5  PLT 165 155 168    Microbiology: Results for orders placed or performed during the hospital encounter of 08/05/14  Influenza A&B Antigens [facility])     Status: None   Collection Time: 08/05/14  5:49 PM  Result Value Ref Range Status   Micro Text Report   Final       COMMENT                   NEGATIVE FOR INFLUENZA A (ANTIGEN ABSENT)   COMMENT                   NEGATIVE FOR INFLUENZA B (ANTIGEN ABSENT)   ANTIBIOTIC                                                      Culture, blood (single)     Status: None   Collection Time: 08/05/14  6:26 PM  Result Value Ref Range Status   Micro Text Report   Final       COMMENT                   NO  GROWTH AEROBICALLY/ANAEROBICALLY IN 5 DAYS   ANTIBIOTIC                                                      Culture, blood (single)     Status: None   Collection Time: 08/05/14  6:41 PM  Result Value Ref Range Status   Micro Text Report   Final       COMMENT                   NO GROWTH AEROBICALLY/ANAEROBICALLY IN 5  DAYS   ANTIBIOTIC                                                      Culture, blood (single)     Status: None   Collection Time: 08/05/14 10:15 PM  Result Value Ref Range Status   Micro Text Report   Final       COMMENT                   NO GROWTH AEROBICALLY/ANAEROBICALLY IN 5 DAYS   ANTIBIOTIC                                                      Culture, blood (single)     Status: None   Collection Time: 08/07/14 10:30 AM  Result Value Ref Range Status   Micro Text Report   Final       COMMENT                   NO GROWTH AEROBICALLY/ANAEROBICALLY IN 5 DAYS   ANTIBIOTIC                                                      Culture, blood (single)     Status: None   Collection Time: 08/07/14 10:43 AM  Result Value Ref Range Status   Micro Text Report   Final       COMMENT                   NO GROWTH AEROBICALLY/ANAEROBICALLY IN 5 DAYS   ANTIBIOTIC                                                      Culture, blood (single)     Status: None   Collection Time: 08/10/14  6:43 PM  Result Value Ref Range Status   Micro Text Report   Final       COMMENT                   NO GROWTH AEROBICALLY/ANAEROBICALLY IN 5 DAYS   ANTIBIOTIC                                                      Culture, blood (single)     Status: None   Collection Time: 08/10/14  6:43 PM  Result Value Ref Range Status   Micro Text Report   Final       COMMENT                   NO GROWTH AEROBICALLY/ANAEROBICALLY IN 5 DAYS   ANTIBIOTIC  Culture, blood (single)     Status: None   Collection Time: 08/13/14 12:40 AM  Result  Value Ref Range Status   Micro Text Report   Final       COMMENT                   NO GROWTH AEROBICALLY/ANAEROBICALLY IN 5 DAYS   ANTIBIOTIC                                                      Culture, blood (single)     Status: None   Collection Time: 08/13/14 12:50 AM  Result Value Ref Range Status   Micro Text Report   Final       COMMENT                   NO GROWTH AEROBICALLY/ANAEROBICALLY IN 5 DAYS   ANTIBIOTIC                                                      Culture, fungus without smear     Status: None   Collection Time: 08/13/14  8:40 AM  Result Value Ref Range Status   Micro Text Report   Final       SOURCE: BRONCH WASHING    ORGANISM 1                Candida albicans   COMMENT                   -   COMMENT                   -   ANTIBIOTIC                    ORG#1                                               Bronchial Wash Culture     Status: None   Collection Time: 08/13/14  8:40 AM  Result Value Ref Range Status   Micro Text Report   Final       ORGANISM 1                LIGHT GROWTH Candida albicans   COMMENT                   -   ANTIBIOTIC                    ORG#1                                               Influenza A,B,H1N1 - PCR ([facility])     Status: None   Collection Time: 08/15/14  2:20 PM  Result Value Ref Range Status   Influenza A By PCR NEGATIVE NEGATIVE Final   Influenza B By PCR  NEGATIVE NEGATIVE Final   H1N1 flu by pcr NOT DETECTED NOT-DETECTED Final    Comment:                  ----------------------- The Xpert Flu assay (FDA approval for nasal aspirates or washes and nasopharyngeal swab specimens), is intended as an aid in the diagnosis of influenza and should not be used as a sole basis for treatment.   Culture, expectorated sputum-assessment     Status: None   Collection Time: 08/21/14 12:36 PM  Result Value Ref Range Status   Micro Text Report   Final       SOURCE: ETS    COMMENT                   APPEARS TO BE  NORMAL FLORA AT 48 HOURS   GRAM STAIN                FEW WHITE BLOOD CELLS RARE GRAM NEGATIVE DIPLOCOCCI   GRAM STAIN                RARE GRAM POSITIVE COCCI   GRAM STAIN                GOOD SPECIMEN-80-90% WBC   GRAM STAIN                RARE GRAM NEGATIVE COCCO-BACILLI   ANTIBIOTIC                                                      Culture, blood (single)     Status: None (Preliminary result)   Collection Time: 08/21/14  3:31 PM  Result Value Ref Range Status   Micro Text Report   Preliminary       COMMENT                   NO GROWTH IN 36 HOURS   ANTIBIOTIC                                                      Culture, blood (single)     Status: None (Preliminary result)   Collection Time: 08/21/14  5:31 PM  Result Value Ref Range Status   Micro Text Report   Preliminary       COMMENT                   NO GROWTH IN 36 HOURS   ANTIBIOTIC                                                      Culture, bal-quantitative     Status: None   Collection Time: 08/25/14  1:48 PM  Result Value Ref Range Status   Specimen Description BRONCHIAL ALVEOLAR LAVAGE  Final   Special Requests NONE  Final   Gram Stain   Final    GOOD SPECIMEN - 80-90% WBCS FEW WBC SEEN RARE GRAM POSITIVE COCCI RARE GRAM NEGATIVE COCCOBACILLI RARE GRAM NEGATIVE COCCI IN  PAIRS    Culture APPEARS TO BE NORMAL FLORA AT 71 HOURS  Final   Report Status 09/04/2014 FINAL  Final    Coagulation Studies: No results for input(s): LABPROT, INR in the last 72 hours.  Urinalysis: No results for input(s): COLORURINE, LABSPEC, PHURINE, GLUCOSEU, HGBUR, BILIRUBINUR, KETONESUR, PROTEINUR, UROBILINOGEN, NITRITE, LEUKOCYTESUR in the last 72 hours.  Invalid input(s): APPERANCEUR    Imaging: Dg Chest Port 1 View  09/21/2014   CLINICAL DATA:  Followup pneumonia, tracheostomy placement  EXAM: PORTABLE CHEST - 1 VIEW  COMPARISON:  09/08/2014  FINDINGS: Cardiac shadow is mildly prominent with prominent central vascular  congestion. The overall appearance is stable. A component of pulmonary edema is noted but improved from the prior study. Tracheostomy tube and right jugular line are again seen and stable. The nasogastric catheter has been removed.  IMPRESSION: Persistent vascular congestion and mild edema although improved from the prior study.  Tubes and lines as described.   Electronically Signed   By: Inez Catalina M.D.   On: 09/21/2014 08:19     Medications:       Assessment/ Plan:  39 y.o. AA male with End Stage Renal Disease on MWF schedule with right arm AVF/AVG followed by [facility] [address], hypertension, hyperlipidemia, Guillain-Barre syndrome, admission for fever 08/05/14  1. ESRD: MWF HD: N18.6 [place]. [facility]. - Pt had HD today, will plan for HD again on Wed, increase UF target to 3kg on Wed.  2. Acute respiratory failure with pneumonia: J96.00, J15.9. S/P tracheostomy. - PM valve in place, vascular congestion on CXR, increase UF to 3kg next HD treatment.  3. Anemia of Chronic Kidney Disease: D63.1   hgb up again to 8.6, continue aranesp.  4. Secondary Hyperparathyroidism:N25.81 -Phos 3.6 and acceptable, will continue to monitor phos periodically.    Saumya Hukill 5/30/20161:54 PM

## 2014-09-22 NOTE — Progress Notes (Signed)
   Name: Patrick Downs MRN: [id number] DOB: Oct 11, 1975    ADMISSION DATE:  08/26/2014 CONSULTATION DATE:  5/5  REFERRING MD :  [facility]  CHIEF COMPLAINT:  Resp failure  BRIEF PATIENT DESCRIPTION: 39 y/o AAM with recent admit related to GBS, ARDS 2/2 aspiration, volume overload, ESRD on vent   SUBJECTIVE/OVERNIGHT/INTERVAL HX Cmfortable on ATC   VITAL SIGNS:   PHYSICAL EXAMINATION: General: NAD, comfortable on ATC Neuro: RASS 0, + F/C HEENT:  WNL Cardiovascular: Reg, no M Lungs: clear Abdomen:  Soft, NABS Ext: no edema    PULMONARY No results for input(s): PHART, PCO2ART, PO2ART, HCO3, TCO2, O2SAT in the last 168 hours.  Invalid input(s): PCO2, PO2  CBC  Recent Labs Lab 09/16/14 0500 09/18/14 0640 09/21/14 0508  HGB 7.6* 8.1* 8.6*  HCT 23.9* 26.8* 28.2*  WBC 5.0 4.9 5.0  PLT 165 155 168    COAGULATION No results for input(s): INR in the last 168 hours.  CARDIAC  No results for input(s): TROPONINI in the last 168 hours. No results for input(s): PROBNP in the last 168 hours.   CHEMISTRY  Recent Labs Lab 09/16/14 0500 09/18/14 0641 09/21/14 0508  NA 136 139 135  K 3.7 4.0 5.0  CL 96* 98* 96*  CO2 28 29 28   GLUCOSE 99 115* 145*  BUN 55* 52* 59*  CREATININE 9.93* 9.75* 11.30*  CALCIUM 9.2 9.8 9.7  PHOS 4.1 4.3 3.6   CrCl cannot be calculated (Unknown ideal weight.).   LIVER  Recent Labs Lab 09/16/14 0500 09/18/14 0641 09/21/14 0508  ALBUMIN 2.7* 3.0* 3.0*     INFECTIOUS No results for input(s): LATICACIDVEN, PROCALCITON in the last 168 hours.   ENDOCRINE CBG (last 3)  No results for input(s): GLUCAP in the last 72 hours.  Most recent film on 5/30 showed improved aeration when compared w/ prior film.    ASSESSMENT  Resolving ARDS Trach status  Plan: Cont weaning in ATC as tolerated Rest of medical mgmt per primary team    09/22/2014 11:48 AM

## 2014-09-23 DIAGNOSIS — J189 Pneumonia, unspecified organism: Secondary | ICD-10-CM | POA: Diagnosis not present

## 2014-09-23 DIAGNOSIS — Z930 Tracheostomy status: Secondary | ICD-10-CM | POA: Diagnosis not present

## 2014-09-23 DIAGNOSIS — J9600 Acute respiratory failure, unspecified whether with hypoxia or hypercapnia: Secondary | ICD-10-CM | POA: Diagnosis not present

## 2014-09-23 DIAGNOSIS — A419 Sepsis, unspecified organism: Secondary | ICD-10-CM | POA: Diagnosis not present

## 2014-09-23 LAB — CBC
HCT: 27.5 % — ABNORMAL LOW (ref 39.0–52.0)
HEMOGLOBIN: 8.5 g/dL — AB (ref 13.0–17.0)
MCH: 26.3 pg (ref 26.0–34.0)
MCHC: 30.9 g/dL (ref 30.0–36.0)
MCV: 85.1 fL (ref 78.0–100.0)
Platelets: 186 10*3/uL (ref 150–400)
RBC: 3.23 MIL/uL — ABNORMAL LOW (ref 4.22–5.81)
RDW: 15.8 % — AB (ref 11.5–15.5)
WBC: 4.5 10*3/uL (ref 4.0–10.5)

## 2014-09-23 LAB — RENAL FUNCTION PANEL
Albumin: 3.7 g/dL (ref 3.5–5.0)
Anion gap: 16 — ABNORMAL HIGH (ref 5–15)
BUN: 79 mg/dL — ABNORMAL HIGH (ref 6–20)
CO2: 25 mmol/L (ref 22–32)
Calcium: 9 mg/dL (ref 8.9–10.3)
Chloride: 94 mmol/L — ABNORMAL LOW (ref 101–111)
Creatinine, Ser: 10.29 mg/dL — ABNORMAL HIGH (ref 0.61–1.24)
GFR calc Af Amer: 6 mL/min — ABNORMAL LOW (ref >60)
GFR calc non Af Amer: 6 mL/min — ABNORMAL LOW (ref >60)
GLUCOSE: 141 mg/dL — AB (ref 65–99)
POTASSIUM: 4.7 mmol/L (ref 3.5–5.1)
Phosphorus: 5.5 mg/dL — ABNORMAL HIGH (ref 2.5–4.6)
SODIUM: 135 mmol/L (ref 135–145)

## 2014-09-23 NOTE — Progress Notes (Signed)
Subjective:  Pt completed HD today, UF achieved was 3.5 kg. Pt resting quietly. Watching TV  Objective:  Vital signs in last 24 hours:    Weight change:  There were no vitals filed for this visit.  Intake/Output:      Intake/Output this shift:    Physical Exam: General: NAD  Head: Atraumatic. OM moist   ENT: Anicteric  Neck: Trach in place, PM valve in place  Lungs:  Normal effort, CTAB  Heart: S1S2 no rubs  Abdomen:  Soft, nontender, PEG in place  Extremities: trace peripheral edema.  Neurologic: Awake, alert, follows commands   Skin: No rash  Access: Rt arm AVF    Basic Metabolic Panel:  Recent Labs Lab 09/18/14 0641 09/21/14 0508 09/23/14 0515  NA 139 135 135  K 4.0 5.0 4.7  CL 98* 96* 94*  CO2 29 28 25   GLUCOSE 115* 145* 141*  BUN 52* 59* 79*  CREATININE 9.75* 11.30* 10.29*  CALCIUM 9.8 9.7 9.0  PHOS 4.3 3.6 5.5*    Liver Function Tests:  Recent Labs Lab 09/18/14 0641 09/21/14 0508 09/23/14 0515  ALBUMIN 3.0* 3.0* 3.7   No results for input(s): LIPASE, AMYLASE in the last 168 hours. No results for input(s): AMMONIA in the last 168 hours.  CBC:  Recent Labs Lab 09/18/14 0640 09/21/14 0508 09/23/14 0515  WBC 4.9 5.0 4.5  HGB 8.1* 8.6* 8.5*  HCT 26.8* 28.2* 27.5*  MCV 87.9 85.5 85.1  PLT 155 168 186    Microbiology: Results for orders placed or performed during the hospital encounter of 08/05/14  Influenza A&B Antigens [facility])     Status: None   Collection Time: 08/05/14  5:49 PM  Result Value Ref Range Status   Micro Text Report   Final       COMMENT                   NEGATIVE FOR INFLUENZA A (ANTIGEN ABSENT)   COMMENT                   NEGATIVE FOR INFLUENZA B (ANTIGEN ABSENT)   ANTIBIOTIC                                                      Culture, blood (single)     Status: None   Collection Time: 08/05/14  6:26 PM  Result Value Ref Range Status   Micro Text Report   Final       COMMENT                   NO GROWTH  AEROBICALLY/ANAEROBICALLY IN 5 DAYS   ANTIBIOTIC                                                      Culture, blood (single)     Status: None   Collection Time: 08/05/14  6:41 PM  Result Value Ref Range Status   Micro Text Report   Final       COMMENT                   NO GROWTH AEROBICALLY/ANAEROBICALLY IN 5 DAYS  ANTIBIOTIC                                                      Culture, blood (single)     Status: None   Collection Time: 08/05/14 10:15 PM  Result Value Ref Range Status   Micro Text Report   Final       COMMENT                   NO GROWTH AEROBICALLY/ANAEROBICALLY IN 5 DAYS   ANTIBIOTIC                                                      Culture, blood (single)     Status: None   Collection Time: 08/07/14 10:30 AM  Result Value Ref Range Status   Micro Text Report   Final       COMMENT                   NO GROWTH AEROBICALLY/ANAEROBICALLY IN 5 DAYS   ANTIBIOTIC                                                      Culture, blood (single)     Status: None   Collection Time: 08/07/14 10:43 AM  Result Value Ref Range Status   Micro Text Report   Final       COMMENT                   NO GROWTH AEROBICALLY/ANAEROBICALLY IN 5 DAYS   ANTIBIOTIC                                                      Culture, blood (single)     Status: None   Collection Time: 08/10/14  6:43 PM  Result Value Ref Range Status   Micro Text Report   Final       COMMENT                   NO GROWTH AEROBICALLY/ANAEROBICALLY IN 5 DAYS   ANTIBIOTIC                                                      Culture, blood (single)     Status: None   Collection Time: 08/10/14  6:43 PM  Result Value Ref Range Status   Micro Text Report   Final       COMMENT                   NO GROWTH AEROBICALLY/ANAEROBICALLY IN 5 DAYS   ANTIBIOTIC  Culture, blood (single)     Status: None   Collection Time: 08/13/14 12:40 AM  Result Value  Ref Range Status   Micro Text Report   Final       COMMENT                   NO GROWTH AEROBICALLY/ANAEROBICALLY IN 5 DAYS   ANTIBIOTIC                                                      Culture, blood (single)     Status: None   Collection Time: 08/13/14 12:50 AM  Result Value Ref Range Status   Micro Text Report   Final       COMMENT                   NO GROWTH AEROBICALLY/ANAEROBICALLY IN 5 DAYS   ANTIBIOTIC                                                      Culture, fungus without smear     Status: None   Collection Time: 08/13/14  8:40 AM  Result Value Ref Range Status   Micro Text Report   Final       SOURCE: BRONCH WASHING    ORGANISM 1                Candida albicans   COMMENT                   -   COMMENT                   -   ANTIBIOTIC                    ORG#1                                               Bronchial Wash Culture     Status: None   Collection Time: 08/13/14  8:40 AM  Result Value Ref Range Status   Micro Text Report   Final       ORGANISM 1                LIGHT GROWTH Candida albicans   COMMENT                   -   ANTIBIOTIC                    ORG#1                                               Influenza A,B,H1N1 - PCR ([facility])     Status: None   Collection Time: 08/15/14  2:20 PM  Result Value Ref Range Status   Influenza A By PCR NEGATIVE NEGATIVE Final   Influenza B By PCR  NEGATIVE NEGATIVE Final   H1N1 flu by pcr NOT DETECTED NOT-DETECTED Final    Comment:                  ----------------------- The Xpert Flu assay (FDA approval for nasal aspirates or washes and nasopharyngeal swab specimens), is intended as an aid in the diagnosis of influenza and should not be used as a sole basis for treatment.   Culture, expectorated sputum-assessment     Status: None   Collection Time: 08/21/14 12:36 PM  Result Value Ref Range Status   Micro Text Report   Final       SOURCE: ETS    COMMENT                   APPEARS TO BE NORMAL  FLORA AT 48 HOURS   GRAM STAIN                FEW WHITE BLOOD CELLS RARE GRAM NEGATIVE DIPLOCOCCI   GRAM STAIN                RARE GRAM POSITIVE COCCI   GRAM STAIN                GOOD SPECIMEN-80-90% WBC   GRAM STAIN                RARE GRAM NEGATIVE COCCO-BACILLI   ANTIBIOTIC                                                      Culture, blood (single)     Status: None (Preliminary result)   Collection Time: 08/21/14  3:31 PM  Result Value Ref Range Status   Micro Text Report   Preliminary       COMMENT                   NO GROWTH IN 36 HOURS   ANTIBIOTIC                                                      Culture, blood (single)     Status: None (Preliminary result)   Collection Time: 08/21/14  5:31 PM  Result Value Ref Range Status   Micro Text Report   Preliminary       COMMENT                   NO GROWTH IN 36 HOURS   ANTIBIOTIC                                                      Culture, bal-quantitative     Status: None   Collection Time: 08/25/14  1:48 PM  Result Value Ref Range Status   Specimen Description BRONCHIAL ALVEOLAR LAVAGE  Final   Special Requests NONE  Final   Gram Stain   Final    GOOD SPECIMEN - 80-90% WBCS FEW WBC SEEN RARE GRAM POSITIVE COCCI RARE GRAM NEGATIVE COCCOBACILLI RARE GRAM NEGATIVE COCCI IN  PAIRS    Culture APPEARS TO BE NORMAL FLORA AT 26 HOURS  Final   Report Status 09/04/2014 FINAL  Final    Coagulation Studies: No results for input(s): LABPROT, INR in the last 72 hours.  Urinalysis: No results for input(s): COLORURINE, LABSPEC, PHURINE, GLUCOSEU, HGBUR, BILIRUBINUR, KETONESUR, PROTEINUR, UROBILINOGEN, NITRITE, LEUKOCYTESUR in the last 72 hours.  Invalid input(s): APPERANCEUR    Imaging: No results found.   Medications:    ARANESP 200 MCG WEEKLY PHOSLO one cap daily   Assessment/ Plan:  39 y.o. AA male with End Stage Renal Disease on MWF schedule with right arm AVF/AVG followed by [facility]ph[facility] [address],  hypertension, hyperlipidemia, Guillain-Barre syndrome, admission for fever 08/05/14  1. ESRD: MWF HD: N18.6 [place]. [facility] Nephrology. - Pt had HD today, will plan for HD again on Friday - UF as tolerated.  2. Acute respiratory failure with pneumonia: J96.00, J15.9. S/P tracheostomy. - PM valve in place,  3. Anemia of Chronic Kidney Disease: D63.1   hgb up again to 8.5, continue aranesp.  4. Secondary Hyperparathyroidism:N25.81 -Phos 5.5  - increase phoslo to 2 with each meal    Mikel Hardgrove 6/1/20162:48 PM

## 2014-09-23 NOTE — Progress Notes (Signed)
   Name: Patrick Downs MRN: [id number] DOB: [birth year]    ADMISSION DATE:  08/26/2014 CONSULTATION DATE:  5/5  REFERRING MD :  [facility]  CHIEF COMPLAINT:  Resp failure  BRIEF PATIENT DESCRIPTION: 39 y/o AAM with recent admit related to GBS, ARDS 2/2 aspiration, volume overload, ESRD on vent   SUBJECTIVE/OVERNIGHT/INTERVAL HX Cmfortable on ATC   VITAL SIGNS: 118/70 64 20 98% 28% t collar  PHYSICAL EXAMINATION: General: NAD, comfortable on ATC, sitting up eating. Neuro: RASS 0, + F/C HEENT:  WNL, PMR in place Cardiovascular: Reg, no M Lungs: clear Abdomen:  Soft, NABS Ext: no edema    PULMONARY No results for input(s): PHART, PCO2ART, PO2ART, HCO3, TCO2, O2SAT in the last 168 hours.  Invalid input(s): PCO2, PO2  CBC  Recent Labs Lab 09/18/14 0640 09/21/14 0508 09/23/14 0515  HGB 8.1* 8.6* 8.5*  HCT 26.8* 28.2* 27.5*  WBC 4.9 5.0 4.5  PLT 155 168 186    COAGULATION No results for input(s): INR in the last 168 hours.  CARDIAC  No results for input(s): TROPONINI in the last 168 hours. No results for input(s): PROBNP in the last 168 hours.   CHEMISTRY  Recent Labs Lab 09/18/14 0641 09/21/14 0508 09/23/14 0515  NA 139 135 135  K 4.0 5.0 4.7  CL 98* 96* 94*  CO2 29 28 25   GLUCOSE 115* 145* 141*  BUN 52* 59* 79*  CREATININE 9.75* 11.30* 10.29*  CALCIUM 9.8 9.7 9.0  PHOS 4.3 3.6 5.5*   CrCl cannot be calculated (Unknown ideal weight.).   LIVER  Recent Labs Lab 09/18/14 0641 09/21/14 0508 09/23/14 0515  ALBUMIN 3.0* 3.0* 3.7     INFECTIOUS No results for input(s): LATICACIDVEN, PROCALCITON in the last 168 hours.   ENDOCRINE CBG (last 3)  No results for input(s): GLUCAP in the last 72 hours.  No new CXR   ASSESSMENT  Resolving ARDS Trach status  Plan: Cont  ATC as tolerated Rest of medical mgmt per primary team PCCM see once a week  Richardson Landry Minor ACNP Maryanna Shape PCCM Pager [phone] till 3 pm If no answer page [phone] 09/23/2014,  9:46 AM  Attending note:  39 year old ESRD-HD patient who was intubated in [facility] for GBS induced respiratory failure. Self extubated 3 times then reintubated last on 5/3. He remains very deconditioned physically and unable to wean. Electrolytes ok for now but will need CVVH.  I reviewed CXR myself, trach in good position.  Discussed with NP and SSH-MD.  Acute respiratory failure due to weakness. - Continue trach collar as tolerated. Lurline Idol care as ordered. - Titrate O2 as able.  Aspiration PNA: - Continue abx. - F/U on cultures, NTD.  GBS:  - Unclear to me if treated in [facility] or not. - Recommend involvement of neurology.  Septic shock due to aspiration pneumonia.  Resolved. - Abx as above.  - Monitor.  ESRD-HD: - HD per renal.  Patient seen and examined, agree with above note.  I dictated the care and orders written for this patient under my direction.  Rush Farmer, MD [phone]

## 2014-09-24 LAB — PARATHYROID HORMONE, INTACT (NO CA): PTH: 85 pg/mL — AB (ref 15–65)

## 2014-09-25 LAB — CBC
HCT: 30.3 % — ABNORMAL LOW (ref 39.0–52.0)
HEMOGLOBIN: 9.6 g/dL — AB (ref 13.0–17.0)
MCH: 27 pg (ref 26.0–34.0)
MCHC: 31.7 g/dL (ref 30.0–36.0)
MCV: 85.1 fL (ref 78.0–100.0)
Platelets: 180 10*3/uL (ref 150–400)
RBC: 3.56 MIL/uL — AB (ref 4.22–5.81)
RDW: 16.5 % — AB (ref 11.5–15.5)
WBC: 5.8 10*3/uL (ref 4.0–10.5)

## 2014-09-25 LAB — RENAL FUNCTION PANEL
ANION GAP: 16 — AB (ref 5–15)
Albumin: 3.5 g/dL (ref 3.5–5.0)
BUN: 90 mg/dL — AB (ref 6–20)
CALCIUM: 8.6 mg/dL — AB (ref 8.9–10.3)
CO2: 23 mmol/L (ref 22–32)
Chloride: 96 mmol/L — ABNORMAL LOW (ref 101–111)
Creatinine, Ser: 10.06 mg/dL — ABNORMAL HIGH (ref 0.61–1.24)
GFR calc Af Amer: 7 mL/min — ABNORMAL LOW (ref >60)
GFR calc non Af Amer: 6 mL/min — ABNORMAL LOW (ref >60)
Glucose, Bld: 107 mg/dL — ABNORMAL HIGH (ref 65–99)
POTASSIUM: 4.3 mmol/L (ref 3.5–5.1)
Phosphorus: 4.6 mg/dL (ref 2.5–4.6)
SODIUM: 135 mmol/L (ref 135–145)

## 2014-09-25 NOTE — Progress Notes (Signed)
Subjective:  Pt seen during HD, UF target 3kg today. Sitting up in chair during treatment.  Objective:  Vitals:  96.4 62 16 129/85     Physical Exam: General: NAD  Head: Atraumatic. OM moist   ENT: Anicteric  Neck: Trach in place  Lungs:  Normal effort, CTAB  Heart: S1S2 no rubs  Abdomen:  Soft, nontender, PEG in place  Extremities: trace peripheral edema.  Neurologic: Awake, alert, follows commands   Skin: No rash  Access: Rt arm AVF    Basic Metabolic Panel:  Recent Labs Lab 09/21/14 0508 09/23/14 0515 09/25/14 0530  NA 135 135 135  K 5.0 4.7 4.3  CL 96* 94* 96*  CO2 28 25 23   GLUCOSE 145* 141* 107*  BUN 59* 79* 90*  CREATININE 11.30* 10.29* 10.06*  CALCIUM 9.7 9.0 8.6*  PHOS 3.6 5.5* 4.6    Liver Function Tests:  Recent Labs Lab 09/21/14 0508 09/23/14 0515 09/25/14 0530  ALBUMIN 3.0* 3.7 3.5   No results for input(s): LIPASE, AMYLASE in the last 168 hours. No results for input(s): AMMONIA in the last 168 hours.  CBC:  Recent Labs Lab 09/21/14 0508 09/23/14 0515 09/25/14 0530  WBC 5.0 4.5 5.8  HGB 8.6* 8.5* 9.6*  HCT 28.2* 27.5* 30.3*  MCV 85.5 85.1 85.1  PLT 168 186 180    Microbiology: Results for orders placed or performed during the hospital encounter of 08/05/14  Influenza A&B Antigens [facility])     Status: None   Collection Time: 08/05/14  5:49 PM  Result Value Ref Range Status   Micro Text Report   Final       COMMENT                   NEGATIVE FOR INFLUENZA A (ANTIGEN ABSENT)   COMMENT                   NEGATIVE FOR INFLUENZA B (ANTIGEN ABSENT)   ANTIBIOTIC                                                      Culture, blood (single)     Status: None   Collection Time: 08/05/14  6:26 PM  Result Value Ref Range Status   Micro Text Report   Final       COMMENT                   NO GROWTH AEROBICALLY/ANAEROBICALLY IN 5 DAYS   ANTIBIOTIC                                                      Culture, blood (single)     Status:  None   Collection Time: 08/05/14  6:41 PM  Result Value Ref Range Status   Micro Text Report   Final       COMMENT                   NO GROWTH AEROBICALLY/ANAEROBICALLY IN 5 DAYS   ANTIBIOTIC  Culture, blood (single)     Status: None   Collection Time: 08/05/14 10:15 PM  Result Value Ref Range Status   Micro Text Report   Final       COMMENT                   NO GROWTH AEROBICALLY/ANAEROBICALLY IN 5 DAYS   ANTIBIOTIC                                                      Culture, blood (single)     Status: None   Collection Time: 08/07/14 10:30 AM  Result Value Ref Range Status   Micro Text Report   Final       COMMENT                   NO GROWTH AEROBICALLY/ANAEROBICALLY IN 5 DAYS   ANTIBIOTIC                                                      Culture, blood (single)     Status: None   Collection Time: 08/07/14 10:43 AM  Result Value Ref Range Status   Micro Text Report   Final       COMMENT                   NO GROWTH AEROBICALLY/ANAEROBICALLY IN 5 DAYS   ANTIBIOTIC                                                      Culture, blood (single)     Status: None   Collection Time: 08/10/14  6:43 PM  Result Value Ref Range Status   Micro Text Report   Final       COMMENT                   NO GROWTH AEROBICALLY/ANAEROBICALLY IN 5 DAYS   ANTIBIOTIC                                                      Culture, blood (single)     Status: None   Collection Time: 08/10/14  6:43 PM  Result Value Ref Range Status   Micro Text Report   Final       COMMENT                   NO GROWTH AEROBICALLY/ANAEROBICALLY IN 5 DAYS   ANTIBIOTIC                                                      Culture, blood (single)     Status: None   Collection Time:  08/13/14 12:40 AM  Result Value Ref Range Status   Micro Text Report   Final       COMMENT                   NO GROWTH AEROBICALLY/ANAEROBICALLY IN 5 DAYS   ANTIBIOTIC                                                       Culture, blood (single)     Status: None   Collection Time: 08/13/14 12:50 AM  Result Value Ref Range Status   Micro Text Report   Final       COMMENT                   NO GROWTH AEROBICALLY/ANAEROBICALLY IN 5 DAYS   ANTIBIOTIC                                                      Culture, fungus without smear     Status: None   Collection Time: 08/13/14  8:40 AM  Result Value Ref Range Status   Micro Text Report   Final       SOURCE: BRONCH WASHING    ORGANISM 1                Candida albicans   COMMENT                   -   COMMENT                   -   ANTIBIOTIC                    ORG#1                                               Bronchial Wash Culture     Status: None   Collection Time: 08/13/14  8:40 AM  Result Value Ref Range Status   Micro Text Report   Final       ORGANISM 1                LIGHT GROWTH Candida albicans   COMMENT                   -   ANTIBIOTIC                    ORG#1                                               Influenza A,B,H1N1 - PCR ([facility])     Status: None   Collection Time: 08/15/14  2:20 PM  Result Value Ref Range Status   Influenza A By PCR NEGATIVE NEGATIVE Final   Influenza B By PCR NEGATIVE NEGATIVE Final   H1N1 flu by pcr NOT DETECTED NOT-DETECTED Final  Comment:                  ----------------------- The Xpert Flu assay (FDA approval for nasal aspirates or washes and nasopharyngeal swab specimens), is intended as an aid in the diagnosis of influenza and should not be used as a sole basis for treatment.   Culture, expectorated sputum-assessment     Status: None   Collection Time: 08/21/14 12:36 PM  Result Value Ref Range Status   Micro Text Report   Final       SOURCE: ETS    COMMENT                   APPEARS TO BE NORMAL FLORA AT 48 HOURS   GRAM STAIN                FEW WHITE BLOOD CELLS RARE GRAM NEGATIVE DIPLOCOCCI   GRAM STAIN                RARE GRAM POSITIVE  COCCI   GRAM STAIN                GOOD SPECIMEN-80-90% WBC   GRAM STAIN                RARE GRAM NEGATIVE COCCO-BACILLI   ANTIBIOTIC                                                      Culture, blood (single)     Status: None (Preliminary result)   Collection Time: 08/21/14  3:31 PM  Result Value Ref Range Status   Micro Text Report   Preliminary       COMMENT                   NO GROWTH IN 36 HOURS   ANTIBIOTIC                                                      Culture, blood (single)     Status: None (Preliminary result)   Collection Time: 08/21/14  5:31 PM  Result Value Ref Range Status   Micro Text Report   Preliminary       COMMENT                   NO GROWTH IN 36 HOURS   ANTIBIOTIC                                                      Culture, bal-quantitative     Status: None   Collection Time: 08/25/14  1:48 PM  Result Value Ref Range Status   Specimen Description BRONCHIAL ALVEOLAR LAVAGE  Final   Special Requests NONE  Final   Gram Stain   Final    GOOD SPECIMEN - 80-90% WBCS FEW WBC SEEN RARE GRAM POSITIVE COCCI RARE GRAM NEGATIVE COCCOBACILLI RARE GRAM NEGATIVE COCCI IN PAIRS    Culture APPEARS TO BE NORMAL FLORA AT 48 HOURS  Final  Report Status 09/04/2014 FINAL  Final    Coagulation Studies: No results for input(s): LABPROT, INR in the last 72 hours.  Urinalysis: No results for input(s): COLORURINE, LABSPEC, PHURINE, GLUCOSEU, HGBUR, BILIRUBINUR, KETONESUR, PROTEINUR, UROBILINOGEN, NITRITE, LEUKOCYTESUR in the last 72 hours.  Invalid input(s): APPERANCEUR    Imaging: No results found.   Medications:    ARANESP 200 MCG WEEKLY phoslo 2 tabs po tid/wm  Assessment/ Plan:  39 y.o. AA male with End Stage Renal Disease on MWF schedule with right arm AVF/AVG followed by [facility] [address], hypertension, hyperlipidemia, Guillain-Barre syndrome, admission for fever 08/05/14  1. ESRD: MWF HD: N18.6 [place]. [facility] Nephrology. - Pt  seen during HD, tolerating well, UF target 3kg.  2. Acute respiratory failure with pneumonia: J96.00, J15.9. S/P tracheostomy. - PM valve in place, progressing well from respiratory failure perspective.  3. Anemia of Chronic Kidney Disease: D63.1   hgb up to 9.6, continue aranesp at current doseage.  4. Secondary Hyperparathyroidism:N25.81 -Phos downt to 4.6 and acceptable, monitor.    Patrick Downs 6/3/20168:17 AM

## 2014-09-26 LAB — HEPATITIS B SURFACE ANTIGEN: Hepatitis B Surface Ag: NEGATIVE — AB

## 2014-09-26 LAB — HEPATITIS B SURFACE ANTIBODY,QUALITATIVE: Hep B S Ab: REACTIVE

## 2014-09-28 LAB — CBC
HCT: 29.8 % — ABNORMAL LOW (ref 39.0–52.0)
HEMOGLOBIN: 9.4 g/dL — AB (ref 13.0–17.0)
MCH: 27 pg (ref 26.0–34.0)
MCHC: 31.5 g/dL (ref 30.0–36.0)
MCV: 85.6 fL (ref 78.0–100.0)
Platelets: 174 10*3/uL (ref 150–400)
RBC: 3.48 MIL/uL — ABNORMAL LOW (ref 4.22–5.81)
RDW: 17.9 % — ABNORMAL HIGH (ref 11.5–15.5)
WBC: 6.9 10*3/uL (ref 4.0–10.5)

## 2014-09-28 LAB — RENAL FUNCTION PANEL
Albumin: 3 g/dL — ABNORMAL LOW (ref 3.5–5.0)
Anion gap: 16 — ABNORMAL HIGH (ref 5–15)
BUN: 121 mg/dL — AB (ref 6–20)
CALCIUM: 7.9 mg/dL — AB (ref 8.9–10.3)
CO2: 25 mmol/L (ref 22–32)
CREATININE: 11.93 mg/dL — AB (ref 0.61–1.24)
Chloride: 97 mmol/L — ABNORMAL LOW (ref 101–111)
GFR calc non Af Amer: 5 mL/min — ABNORMAL LOW (ref >60)
GFR, EST AFRICAN AMERICAN: 5 mL/min — AB (ref >60)
GLUCOSE: 127 mg/dL — AB (ref 65–99)
Phosphorus: 4.4 mg/dL (ref 2.5–4.6)
Potassium: 4.6 mmol/L (ref 3.5–5.1)
Sodium: 138 mmol/L (ref 135–145)

## 2014-09-28 NOTE — Progress Notes (Signed)
  Subjective:  Had dialysis earlier today.  3000 cc removed  Objective:  Vitals:  116/74  68  14  97.9     Physical Exam: General: NAD  Head: Atraumatic. OM moist   ENT: Anicteric  Neck: Trach site covered with bandage  Lungs:  Normal effort, CTAB  Heart: S1S2 no rubs  Abdomen:  Soft, + tender, PEG in place  Extremities: trace peripheral edema.  Neurologic: Awake, alert, follows commands   Skin: No rash  Access: Rt arm AVF, good thrill    Basic Metabolic Panel:  Recent Labs Lab 09/23/14 0515 09/25/14 0530 09/28/14 0600  NA 135 135 138  K 4.7 4.3 4.6  CL 94* 96* 97*  CO2 25 23 25   GLUCOSE 141* 107* 127*  BUN 79* 90* 121*  CREATININE 10.29* 10.06* 11.93*  CALCIUM 9.0 8.6* 7.9*  PHOS 5.5* 4.6 4.4    Liver Function Tests:  Recent Labs Lab 09/23/14 0515 09/25/14 0530 09/28/14 0600  ALBUMIN 3.7 3.5 3.0*   No results for input(s): LIPASE, AMYLASE in the last 168 hours. No results for input(s): AMMONIA in the last 168 hours.  CBC:  Recent Labs Lab 09/23/14 0515 09/25/14 0530 09/28/14 0600  WBC 4.5 5.8 6.9  HGB 8.5* 9.6* 9.4*  HCT 27.5* 30.3* 29.8*  MCV 85.1 85.1 85.6  PLT 186 180 174      Imaging: No results found.   Medications:    ARANESP 200 MCG WEEKLY phoslo 2 tabs po tid/wm  Assessment/ Plan:  39 y.o. AA male with End Stage Renal Disease on MWF schedule with right arm AVF/AVG followed by [facility] Nephrology/[facility] [address], hypertension, hyperlipidemia, Guillain-Barre syndrome, admission for fever 08/05/14  1. ESRD: MWF HD: N18.6 [place]. [facility] Nephrology. - HD earlier today - 3000 cc removed  2. Acute respiratory failure with pneumonia: J96.00, J15.9. S/P tracheostomy. - trach site covered with bandage; progressing well from respiratory failure perspective.  3. Anemia of Chronic Kidney Disease: D63.1   hgb up to 9.4; continue aranesp at current doseage.  4. Secondary Hyperparathyroidism:N25.81 -Phos downt to 4.4 and acceptable,  monitor.    Emelee Rodocker 6/6/20163:10 PM

## 2014-09-29 LAB — HEPATITIS B SURFACE ANTIBODY,QUALITATIVE: Hep B S Ab: REACTIVE

## 2014-09-29 LAB — HEPATITIS B SURFACE ANTIGEN: Hepatitis B Surface Ag: NEGATIVE

## 2014-09-30 LAB — CBC
HEMATOCRIT: 34.6 % — AB (ref 39.0–52.0)
Hemoglobin: 11 g/dL — ABNORMAL LOW (ref 13.0–17.0)
MCH: 27.8 pg (ref 26.0–34.0)
MCHC: 31.8 g/dL (ref 30.0–36.0)
MCV: 87.4 fL (ref 78.0–100.0)
PLATELETS: 151 10*3/uL (ref 150–400)
RBC: 3.96 MIL/uL — AB (ref 4.22–5.81)
RDW: 18.6 % — ABNORMAL HIGH (ref 11.5–15.5)
WBC: 8.6 10*3/uL (ref 4.0–10.5)

## 2014-09-30 LAB — RENAL FUNCTION PANEL
ALBUMIN: 3.2 g/dL — AB (ref 3.5–5.0)
Anion gap: 15 (ref 5–15)
BUN: 108 mg/dL — ABNORMAL HIGH (ref 6–20)
CO2: 25 mmol/L (ref 22–32)
Calcium: 8.3 mg/dL — ABNORMAL LOW (ref 8.9–10.3)
Chloride: 98 mmol/L — ABNORMAL LOW (ref 101–111)
Creatinine, Ser: 11.31 mg/dL — ABNORMAL HIGH (ref 0.61–1.24)
GFR calc Af Amer: 6 mL/min — ABNORMAL LOW (ref >60)
GFR, EST NON AFRICAN AMERICAN: 5 mL/min — AB (ref >60)
Glucose, Bld: 101 mg/dL — ABNORMAL HIGH (ref 65–99)
Phosphorus: 4.3 mg/dL (ref 2.5–4.6)
Potassium: 5.3 mmol/L — ABNORMAL HIGH (ref 3.5–5.1)
Sodium: 138 mmol/L (ref 135–145)

## 2014-09-30 NOTE — Progress Notes (Signed)
  Subjective:  Had dialysis earlier today.  2000 cc removed BFR 400/ DFR 800  Objective:  Vitals:  116/74  68  14  97.9     Physical Exam: General: NAD, sitting up in chair  Head: Atraumatic. OM moist   ENT: Anicteric  Neck: Trach site covered with bandage  Lungs:  Normal effort, CTAB  Heart: S1S2 no rubs  Abdomen:  Soft, + tender, PEG in place  Extremities: trace peripheral edema.  Neurologic: Awake, alert, follows commands , able to talk  Skin: No rash  Access: Rt arm AVF, good thrill    Basic Metabolic Panel:  Recent Labs Lab 09/25/14 0530 09/28/14 0600 09/30/14 0615  NA 135 138 138  K 4.3 4.6 5.3*  CL 96* 97* 98*  CO2 23 25 25   GLUCOSE 107* 127* 101*  BUN 90* 121* 108*  CREATININE 10.06* 11.93* 11.31*  CALCIUM 8.6* 7.9* 8.3*  PHOS 4.6 4.4 4.3    Liver Function Tests:  Recent Labs Lab 09/25/14 0530 09/28/14 0600 09/30/14 0615  ALBUMIN 3.5 3.0* 3.2*   No results for input(s): LIPASE, AMYLASE in the last 168 hours. No results for input(s): AMMONIA in the last 168 hours.  CBC:  Recent Labs Lab 09/25/14 0530 09/28/14 0600 09/30/14 0615  WBC 5.8 6.9 8.6  HGB 9.6* 9.4* 11.0*  HCT 30.3* 29.8* 34.6*  MCV 85.1 85.6 87.4  PLT 180 174 151      Imaging: No results found.   Medications:    ARANESP 200 MCG WEEKLY phoslo 2 tabs po tid/wm  Assessment/ Plan:  39 y.o. AA male with End Stage Renal Disease on MWF schedule with right arm AVF/AVG followed by [facility] Nephrology/[facility] [address], hypertension, hyperlipidemia, Guillain-Barre syndrome, admission for fever 08/05/14  1. ESRD: MWF HD: N18.6 [place]. [facility] Nephrology. - HD earlier today - 2000 cc removed - Next Treatment on Friday  2. Acute respiratory failure with pneumonia: J96.00, J15.9. S/P tracheostomy. - trach site covered with bandage; progressing well from respiratory failure perspective.  3. Anemia of Chronic Kidney Disease: D63.1   hgb up to 11; continue aranesp at current  doseage.  4. Secondary Hyperparathyroidism:N25.81 -Phos downt to 4.3 and acceptable, monitor.    Yunuen Mordan 6/8/20162:40 PM

## 2014-10-02 LAB — CBC
HEMATOCRIT: 33.5 % — AB (ref 39.0–52.0)
HEMOGLOBIN: 10.5 g/dL — AB (ref 13.0–17.0)
MCH: 27.1 pg (ref 26.0–34.0)
MCHC: 31.3 g/dL (ref 30.0–36.0)
MCV: 86.6 fL (ref 78.0–100.0)
PLATELETS: 138 10*3/uL — AB (ref 150–400)
RBC: 3.87 MIL/uL — ABNORMAL LOW (ref 4.22–5.81)
RDW: 18.4 % — AB (ref 11.5–15.5)
WBC: 6 10*3/uL (ref 4.0–10.5)

## 2014-10-02 LAB — RENAL FUNCTION PANEL
Albumin: 3.3 g/dL — ABNORMAL LOW (ref 3.5–5.0)
Anion gap: 16 — ABNORMAL HIGH (ref 5–15)
BUN: 98 mg/dL — AB (ref 6–20)
CO2: 23 mmol/L (ref 22–32)
CREATININE: 11.53 mg/dL — AB (ref 0.61–1.24)
Calcium: 8.3 mg/dL — ABNORMAL LOW (ref 8.9–10.3)
Chloride: 101 mmol/L (ref 101–111)
GFR calc Af Amer: 6 mL/min — ABNORMAL LOW (ref >60)
GFR, EST NON AFRICAN AMERICAN: 5 mL/min — AB (ref >60)
Glucose, Bld: 91 mg/dL (ref 65–99)
PHOSPHORUS: 5.7 mg/dL — AB (ref 2.5–4.6)
POTASSIUM: 5.4 mmol/L — AB (ref 3.5–5.1)
Sodium: 140 mmol/L (ref 135–145)

## 2014-10-02 NOTE — Progress Notes (Signed)
  Subjective:  Patient seen during dialysis. Tolerating well. No acute c/o. Sitting up in chair BFR 400/ DFR 800  Objective:  Vitals:  116/74  68  14  97.9     Physical Exam: General: NAD, sitting up in chair  Head: Atraumatic. OM moist   ENT: Anicteric  Neck: Trach site covered with bandage  Lungs:  Normal effort, CTAB  Heart: S1S2 no rubs  Abdomen:  Soft, + tender, PEG in place  Extremities: trace peripheral edema.  Neurologic: Awake, alert, follows commands , able to talk  Skin: No rash  Access: Rt arm AVF, good thrill    Basic Metabolic Panel:  Recent Labs Lab 09/28/14 0600 09/30/14 0615 10/02/14 0510  NA 138 138 140  K 4.6 5.3* 5.4*  CL 97* 98* 101  CO2 25 25 23   GLUCOSE 127* 101* 91  BUN 121* 108* 98*  CREATININE 11.93* 11.31* 11.53*  CALCIUM 7.9* 8.3* 8.3*  PHOS 4.4 4.3 5.7*    Liver Function Tests:  Recent Labs Lab 09/28/14 0600 09/30/14 0615 10/02/14 0510  ALBUMIN 3.0* 3.2* 3.3*   No results for input(s): LIPASE, AMYLASE in the last 168 hours. No results for input(s): AMMONIA in the last 168 hours.  CBC:  Recent Labs Lab 09/28/14 0600 09/30/14 0615 10/02/14 0510  WBC 6.9 8.6 6.0  HGB 9.4* 11.0* 10.5*  HCT 29.8* 34.6* 33.5*  MCV 85.6 87.4 86.6  PLT 174 151 138*   Results for Patrick Downs, Patrick Downs (MRN [id number]) as of 10/02/2014 09:13  Ref. Range 09/28/2014 10:40  Hepatitis B Surface Ag Latest Ref Range: Negative  Negative     Imaging: No results found.   Medications:    ARANESP 200 MCG WEEKLY phoslo 2 tabs po tid/wm  Assessment/ Plan:  39 y.o. AA male with End Stage Renal Disease on MWF schedule with right arm AVF/AVG followed by [facility] Nephrology/[facility] [address], hypertension, hyperlipidemia, Guillain-Barre syndrome, admission for fever 08/05/14  1. ESRD: MWF HD: N18.6 [place]. [facility] Nephrology. - Patient seen during dialysis Tolerating well  - Next Treatment on Monday - Possible d/c Monday or Tuesday  2. Acute respiratory  failure with pneumonia: J96.00, J15.9. S/P tracheostomy. - trach site covered with bandage; progressing well from respiratory failure perspective.  3. Anemia of Chronic Kidney Disease: D63.1   hgb up to 10.5; continue aranesp at current doseage.  4. Secondary Hyperparathyroidism:N25.81 -Phos 5.7, continue PhosLo    Wilma Wuthrich 6/10/20169:10 AM

## 2014-10-05 LAB — RENAL FUNCTION PANEL
Albumin: 3.1 g/dL — ABNORMAL LOW (ref 3.5–5.0)
Anion gap: 18 — ABNORMAL HIGH (ref 5–15)
BUN: 94 mg/dL — AB (ref 6–20)
CALCIUM: 8 mg/dL — AB (ref 8.9–10.3)
CO2: 21 mmol/L — AB (ref 22–32)
CREATININE: 13.41 mg/dL — AB (ref 0.61–1.24)
Chloride: 98 mmol/L — ABNORMAL LOW (ref 101–111)
GFR calc Af Amer: 5 mL/min — ABNORMAL LOW (ref >60)
GFR calc non Af Amer: 4 mL/min — ABNORMAL LOW (ref >60)
Glucose, Bld: 93 mg/dL (ref 65–99)
Phosphorus: 6.7 mg/dL — ABNORMAL HIGH (ref 2.5–4.6)
Potassium: 5.5 mmol/L — ABNORMAL HIGH (ref 3.5–5.1)
Sodium: 137 mmol/L (ref 135–145)

## 2014-10-05 LAB — CBC
HCT: 34.5 % — ABNORMAL LOW (ref 39.0–52.0)
Hemoglobin: 11.1 g/dL — ABNORMAL LOW (ref 13.0–17.0)
MCH: 27.1 pg (ref 26.0–34.0)
MCHC: 32.2 g/dL (ref 30.0–36.0)
MCV: 84.4 fL (ref 78.0–100.0)
Platelets: 101 10*3/uL — ABNORMAL LOW (ref 150–400)
RBC: 4.09 MIL/uL — ABNORMAL LOW (ref 4.22–5.81)
RDW: 17.9 % — AB (ref 11.5–15.5)
WBC: 6.5 10*3/uL (ref 4.0–10.5)

## 2014-10-05 LAB — AFB CULTURE WITH SMEAR (NOT AT ARMC)
ACID FAST CULTURE: NEGATIVE
ACID FAST SMEAR: NEGATIVE

## 2014-10-08 DIAGNOSIS — E46 Unspecified protein-calorie malnutrition: Secondary | ICD-10-CM | POA: Insufficient documentation

## 2014-10-21 ENCOUNTER — Other Ambulatory Visit: Payer: Self-pay | Admitting: Infectious Diseases

## 2014-10-21 DIAGNOSIS — M545 Low back pain: Principal | ICD-10-CM

## 2014-10-21 DIAGNOSIS — K9423 Gastrostomy malfunction: Secondary | ICD-10-CM

## 2014-10-21 DIAGNOSIS — R6251 Failure to thrive (child): Secondary | ICD-10-CM

## 2014-10-21 DIAGNOSIS — G629 Polyneuropathy, unspecified: Secondary | ICD-10-CM

## 2014-10-21 DIAGNOSIS — G8929 Other chronic pain: Secondary | ICD-10-CM

## 2014-10-23 ENCOUNTER — Ambulatory Visit (HOSPITAL_COMMUNITY)
Admission: RE | Admit: 2014-10-23 | Discharge: 2014-10-23 | Disposition: A | Discharge status: Home / Self Care | Payer: Medicare Other | Source: Ambulatory Visit | Attending: Infectious Diseases | Admitting: Infectious Diseases

## 2014-10-23 DIAGNOSIS — Z431 Encounter for attention to gastrostomy: Secondary | ICD-10-CM | POA: Diagnosis present

## 2014-10-23 DIAGNOSIS — N186 End stage renal disease: Secondary | ICD-10-CM | POA: Insufficient documentation

## 2014-10-23 DIAGNOSIS — Z992 Dependence on renal dialysis: Secondary | ICD-10-CM | POA: Diagnosis not present

## 2014-10-23 DIAGNOSIS — K9423 Gastrostomy malfunction: Secondary | ICD-10-CM

## 2014-10-23 MED ORDER — LIDOCAINE VISCOUS 2 % MT SOLN
OROMUCOSAL | Status: AC
  Filled 2014-10-23: qty 15

## 2014-10-23 NOTE — Procedures (Signed)
Successful GTUBE REMOVAL No comp Stable Full report in pacs

## 2014-10-23 NOTE — Discharge Instructions (Signed)
Care of a Feeding Tube People who have trouble swallowing or cannot take food or medicine by mouth are sometimes given feeding tubes. A feeding tube can go into the nose and down to the stomach or through the skin in the abdomen and into the stomach or small bowel. Some of the names of these feeding tubes are gastrostomy tubes, PEG lines, nasogastric tubes, and gastrojejunostomy tubes.  SUPPLIES NEEDED TO CARE FOR THE TUBE SITE  Clean gloves.  Clean wash cloth, gauze pads, or soft paper towel.  Cotton swabs.  Skin barrier ointment or cream.  Soap and water.  Pre-cut foam pads or gauze (that go around the tube).  Tube tape. TUBE SITE CARE  Have all supplies ready and available.  Wash hands well.  Put on clean gloves.  Remove the soiled foam pad or gauze, if present, that is found under the tube stabilizer. Change the foam pad or gauze daily or when soiled or moist.  Check the skin around the tube site for redness, rash, swelling, drainage, or extra tissue growth. If you notice any of these, call your caregiver.  Moisten gauze and cotton swabs with water and soap.  Wipe the area closest to the tube (right near the stoma) with cotton swabs. Wipe the surrounding skin with moistened gauze. Rinse with water.  Dry the skin and stoma site with a dry gauze pad or soft paper towel. Do not use antibiotic ointments at the tube site.  If the skin is red, apply a skin barrier cream or ointment (such as petroleum jelly) in a circular motion, using a cotton swab. The cream or ointment will provide a moisture barrier for the skin and helps with wound healing.  Apply a new pre-cut foam pad or gauze around the tube. Secure it with tape around the edges. If no drainage is present, foam pads or gauze may be left off.  Use tape or an anchoring device to fasten the feeding tube to the skin for comfort or as directed. Rotate where you tape the tube to avoid skin damage from the adhesive.  Position  the person in a semi-upright position (30-45 degree angle).  Throw away used supplies.  Remove gloves.  Wash hands. SUPPLIES NEEDED TO FLUSH A FEEDING TUBE 1. Clean gloves. 2. 60 mL syringe (that connects to the feeding tube). 3. Towel. 4. Water. FLUSHING A FEEDING TUBE   Have all supplies ready and available.  Wash hands well.  Put on clean gloves.  Draw up 30 mL of water in the syringe.  Kink the feeding tube while disconnecting it from the feeding-bag tubing or while removing the plug at the end of the tube. Kinking closes the tube and prevents secretions in the tube from spilling out.  Insert the tip of the syringe into the end of the feeding tube. Release the kink. Slowly inject the water.  If unable to inject the water, the person with the feeding tube should lay on his or her left side. The tip of the tube may be against the stomach wall, blocking fluid flow. Changing positions may move the tip away from the stomach wall. After repositioning, try injecting the water again.  After injecting the water, remove the syringe.  Always flush before giving the first medicine, between medicines, and after the final medicine before starting a feeding. This prevents medicines from clogging the tube.  Throw away used supplies.  Remove gloves.  Wash hands. Document Released: 04/10/2005 Document Revised: 03/27/2012 Document Reviewed: 11/23/2011  [employer] Patient Information 2015 [place]. This information is not intended to replace advice given to you by your health care provider. Make sure you discuss any questions you have with your health care provider.  Gastric Tube Replacement You are having your gastric tube (the tube that goes into the stomach) changed. This is usually a very minor procedure. If medications are prescribed, take them as directed. Only take over-the-counter or prescription medications for pain, discomfort, or fever as directed by your caregiver.  SEEK  IMMEDIATE MEDICAL CARE IF:   You develop chills, fever, or show signs of generalized illness.  You develop bleeding around the tube.  Your new tube does not seem to be working properly.  You are unable to get feedings into the tube.  Your tube comes out for any reason. Document Released: 01/03/2001 Document Revised: 07/03/2011 Document Reviewed: 04/10/2005 [facility] Patient Information 2015 [place], [place]. This information is not intended to replace advice given to you by your health care provider. Make sure you discuss any questions you have with your health care provider.  Care of a Feeding Tube Feeding tubes are often given to those who have trouble swallowing or cannot take food or medicine. A feeding tube can:   Go into the nose and down to the stomach.  Go through the skin in the belly (abdomen) and into the stomach or small bowel. SUPPLIES NEEDED TO CARE FOR THE TUBE SITE  Clean gloves.  Clean wash cloth, gauze pads, or soft paper towel.  Cotton swabs.  Skin barrier ointment or cream.  Soap and water.  Precut foam pads or gauze (that go around the tube).  Tube tape. TUBE SITE CARE 5. Have all supplies ready. 6. Wash hands well. 7.  Put on clean gloves. 8. Remove dirty foam pads or gauze near the tube site, if present. 9. Check the skin around the tube site for redness, rash, puffiness (swelling), leaking fluid, or extra tissue growth. Call your doctor if you see any of these. 10. Wet the gauze and cotton swabs with water and soap. 11. Wipe the area closest to the tube with cotton swabs. Wipe the surrounding skin with moistened gauze. Rinse with water. 12. Dry the skin and tube site with a dry gauze pad or soft paper towel. Do not use antibiotic ointments at the tube site. 13. If the skin is red, apply petroleum jelly in a circular motion, using a cotton swab. Your doctor may suggest a different cream or ointment. Use what the doctor suggests. 14. Apply a new  pre-cut foam pad or gauze around the tube. Tape the edges down. Foam pads or gauze may be left off if there is no fluid at the tube site. 15. Use tape or a device that will attach your feeding tube to your skin or do as directed. Rotate where you tape the tube. 16. Sit the person up. 34. Throw away used supplies. 18. Remove gloves. 19. Wash hands. SUPPLIES NEEDED TO FLUSH A FEEDING TUBE  Clean gloves.  60 mL syringe (that connects to feeding tube).  Towel.  Water. FLUSHING A FEEDING TUBE  1. Have all supplies ready. 2. Wash hands well. 3. Put on clean gloves. 4. Pull 30 mL of water into the syringe. 5. Bend (kink) the feeding tube while disconnecting it from the feeding-bag tubing or while removing the plug at the end of the tube. 6. Insert the tip of the syringe into the end of the feeding tube. Stop bending the tube.  Slowly inject the water. 7. If you cannot inject the water, the person with the feeding tube should lay on their left side. 8. After injecting the water, remove the syringe. 9. Always flush the tube before giving the first medicine, between medicines, and after the final medicine before starting a feeding. 10. Throw away used supplies. 11. Remove gloves. 12. Wash hands. Document Released: 01/03/2012 Document Reviewed: 01/03/2012 [facility] Patient Information 2015 [place]. This information is not intended to replace advice given to you by your health care provider. Make sure you discuss any questions you have with your health care provider.

## 2014-10-30 ENCOUNTER — Ambulatory Visit
Admission: RE | Admit: 2014-10-30 | Discharge: 2014-10-30 | Disposition: A | Discharge status: Home / Self Care | Payer: Medicare Other | Source: Ambulatory Visit | Attending: Infectious Diseases | Admitting: Infectious Diseases

## 2014-10-30 DIAGNOSIS — M5117 Intervertebral disc disorders with radiculopathy, lumbosacral region: Secondary | ICD-10-CM | POA: Diagnosis not present

## 2014-10-30 DIAGNOSIS — Q613 Polycystic kidney, unspecified: Secondary | ICD-10-CM | POA: Insufficient documentation

## 2014-10-30 DIAGNOSIS — G8929 Other chronic pain: Secondary | ICD-10-CM

## 2014-10-30 DIAGNOSIS — N186 End stage renal disease: Secondary | ICD-10-CM | POA: Diagnosis present

## 2014-10-30 DIAGNOSIS — N189 Chronic kidney disease, unspecified: Secondary | ICD-10-CM | POA: Diagnosis not present

## 2014-10-30 DIAGNOSIS — M545 Low back pain, unspecified: Secondary | ICD-10-CM

## 2014-10-30 DIAGNOSIS — G629 Polyneuropathy, unspecified: Secondary | ICD-10-CM | POA: Diagnosis present

## 2014-11-26 ENCOUNTER — Encounter: Payer: Self-pay | Admitting: Emergency Medicine

## 2014-11-26 ENCOUNTER — Emergency Department: Payer: Medicare Other

## 2014-11-26 ENCOUNTER — Inpatient Hospital Stay
Admission: EM | Admit: 2014-11-26 | Discharge: 2014-11-28 | DRG: 393 | Disposition: A | Discharge status: Home / Self Care | Payer: Medicare Other | Attending: Internal Medicine | Admitting: Internal Medicine

## 2014-11-26 DIAGNOSIS — D631 Anemia in chronic kidney disease: Secondary | ICD-10-CM | POA: Diagnosis present

## 2014-11-26 DIAGNOSIS — Z833 Family history of diabetes mellitus: Secondary | ICD-10-CM

## 2014-11-26 DIAGNOSIS — K6289 Other specified diseases of anus and rectum: Principal | ICD-10-CM | POA: Diagnosis present

## 2014-11-26 DIAGNOSIS — Z841 Family history of disorders of kidney and ureter: Secondary | ICD-10-CM

## 2014-11-26 DIAGNOSIS — N2581 Secondary hyperparathyroidism of renal origin: Secondary | ICD-10-CM | POA: Diagnosis present

## 2014-11-26 DIAGNOSIS — I9589 Other hypotension: Secondary | ICD-10-CM

## 2014-11-26 DIAGNOSIS — R0902 Hypoxemia: Secondary | ICD-10-CM | POA: Diagnosis present

## 2014-11-26 DIAGNOSIS — I959 Hypotension, unspecified: Secondary | ICD-10-CM | POA: Diagnosis present

## 2014-11-26 DIAGNOSIS — I340 Nonrheumatic mitral (valve) insufficiency: Secondary | ICD-10-CM | POA: Diagnosis present

## 2014-11-26 DIAGNOSIS — M21379 Foot drop, unspecified foot: Secondary | ICD-10-CM | POA: Diagnosis present

## 2014-11-26 DIAGNOSIS — G610 Guillain-Barre syndrome: Secondary | ICD-10-CM | POA: Diagnosis present

## 2014-11-26 DIAGNOSIS — Z992 Dependence on renal dialysis: Secondary | ICD-10-CM

## 2014-11-26 DIAGNOSIS — Z91041 Radiographic dye allergy status: Secondary | ICD-10-CM

## 2014-11-26 DIAGNOSIS — Z7682 Awaiting organ transplant status: Secondary | ICD-10-CM

## 2014-11-26 DIAGNOSIS — I120 Hypertensive chronic kidney disease with stage 5 chronic kidney disease or end stage renal disease: Secondary | ICD-10-CM | POA: Diagnosis present

## 2014-11-26 DIAGNOSIS — E785 Hyperlipidemia, unspecified: Secondary | ICD-10-CM | POA: Diagnosis present

## 2014-11-26 DIAGNOSIS — Z888 Allergy status to other drugs, medicaments and biological substances status: Secondary | ICD-10-CM

## 2014-11-26 DIAGNOSIS — N186 End stage renal disease: Secondary | ICD-10-CM

## 2014-11-26 DIAGNOSIS — Z79899 Other long term (current) drug therapy: Secondary | ICD-10-CM

## 2014-11-26 HISTORY — DX: Disorder of kidney and ureter, unspecified: N28.9

## 2014-11-26 HISTORY — DX: Dependence on renal dialysis: Z99.2

## 2014-11-26 LAB — DIFFERENTIAL
Basophils Absolute: 0.1 10*3/uL (ref 0–0.1)
Basophils Relative: 1 %
EOS PCT: 2 %
Eosinophils Absolute: 0.3 10*3/uL (ref 0–0.7)
Lymphocytes Relative: 20 %
Lymphs Abs: 2.2 10*3/uL (ref 1.0–3.6)
Monocytes Absolute: 1.3 10*3/uL — ABNORMAL HIGH (ref 0.2–1.0)
Monocytes Relative: 12 %
NEUTROS ABS: 7.1 10*3/uL — AB (ref 1.4–6.5)
Neutrophils Relative %: 65 %

## 2014-11-26 LAB — COMPREHENSIVE METABOLIC PANEL
ALK PHOS: 83 U/L (ref 38–126)
ALT: 23 U/L (ref 17–63)
AST: 25 U/L (ref 15–41)
Albumin: 4.2 g/dL (ref 3.5–5.0)
Anion gap: 17 — ABNORMAL HIGH (ref 5–15)
BILIRUBIN TOTAL: 0.4 mg/dL (ref 0.3–1.2)
BUN: 38 mg/dL — AB (ref 6–20)
CALCIUM: 10.2 mg/dL (ref 8.9–10.3)
CO2: 28 mmol/L (ref 22–32)
CREATININE: 8.5 mg/dL — AB (ref 0.61–1.24)
Chloride: 92 mmol/L — ABNORMAL LOW (ref 101–111)
GFR calc Af Amer: 8 mL/min — ABNORMAL LOW (ref >60)
GFR calc non Af Amer: 7 mL/min — ABNORMAL LOW (ref >60)
GLUCOSE: 104 mg/dL — AB (ref 65–99)
Potassium: 4.1 mmol/L (ref 3.5–5.1)
SODIUM: 137 mmol/L (ref 135–145)
TOTAL PROTEIN: 8.5 g/dL — AB (ref 6.5–8.1)

## 2014-11-26 LAB — CBC
HEMATOCRIT: 42.6 % (ref 40.0–52.0)
Hemoglobin: 14 g/dL (ref 13.0–18.0)
MCH: 26.6 pg (ref 26.0–34.0)
MCHC: 32.8 g/dL (ref 32.0–36.0)
MCV: 81 fL (ref 80.0–100.0)
PLATELETS: 173 10*3/uL (ref 150–440)
RBC: 5.26 MIL/uL (ref 4.40–5.90)
RDW: 21.1 % — ABNORMAL HIGH (ref 11.5–14.5)
WBC: 11.1 10*3/uL — ABNORMAL HIGH (ref 3.8–10.6)

## 2014-11-26 LAB — LACTIC ACID, PLASMA: Lactic Acid, Venous: 1.7 mmol/L (ref 0.5–2.0)

## 2014-11-26 LAB — TYPE AND SCREEN
ABO/RH(D): O POS
ANTIBODY SCREEN: NEGATIVE

## 2014-11-26 MED ORDER — SODIUM CHLORIDE 0.9 % IV BOLUS (SEPSIS)
1000.0000 mL | Freq: Once | INTRAVENOUS | Status: AC
  Administered 2014-11-26: 1000 mL via INTRAVENOUS

## 2014-11-26 MED ORDER — NOREPINEPHRINE 4 MG/250ML-% IV SOLN: 0.0000 ug/min | INTRAVENOUS | Status: DC

## 2014-11-26 MED ORDER — LIDOCAINE HCL 2 % EX GEL
1.0000 "application " | Freq: Once | CUTANEOUS | Status: AC
  Administered 2014-11-27: 1
  Filled 2014-11-26: qty 5

## 2014-11-26 MED ORDER — DIPHENHYDRAMINE HCL 50 MG PO CAPS
50.0000 mg | ORAL_CAPSULE | Freq: Once | ORAL | Status: AC
  Administered 2014-11-27: 50 mg via ORAL
  Filled 2014-11-26: qty 1

## 2014-11-26 MED ORDER — METHYLPREDNISOLONE SODIUM SUCC 125 MG IJ SOLR
125.0000 mg | Freq: Once | INTRAMUSCULAR | Status: AC
  Administered 2014-11-27: 125 mg via INTRAVENOUS
  Filled 2014-11-26: qty 2

## 2014-11-26 MED ORDER — HYDROCORTISONE 2.5 % RE CREA
TOPICAL_CREAM | Freq: Once | RECTAL | Status: AC
  Administered 2014-11-27: via RECTAL
  Filled 2014-11-26: qty 28.35

## 2014-11-26 MED ORDER — NOREPINEPHRINE BITARTRATE 1 MG/ML IV SOLN: 0.0000 ug/min | INTRAVENOUS | Status: DC

## 2014-11-26 NOTE — ED Provider Notes (Addendum)
[facility]  I accepted care from Dr. Cinda Quest ____________________________________________     Savage were viewed by me. Imaging interpreted by radiologist.  CT abdomen and pelvis with contrast: Pending  ____________________________________________   PROCEDURES  Procedure(s) performed: None  Critical Care performed: CRITICAL CARE Performed by: Lisa Roca   Total critical care time: 30 minutes  Critical care time was exclusive of separately billable procedures and treating other patients.  Critical care was necessary to treat or prevent imminent or life-threatening deterioration.  Critical care was time spent personally by me on the following activities: development of treatment plan with patient and/or surrogate as well as nursing, discussions with consultants, evaluation of patient's response to treatment, examination of patient, obtaining history from patient or surrogate, ordering and performing treatments and interventions, ordering and review of laboratory studies, ordering and review of radiographic studies, pulse oximetry and re-evaluation of patient's condition.   ____________________________________________   INITIAL IMPRESSION / ASSESSMENT AND PLAN / ED COURSE  CONSULTATIONS: None  Pertinent labs & imaging results that were available during my care of the patient were reviewed by me and considered in my medical decision making (see chart for details).  I except For the patient at shift change from Dr. Cinda Quest. Patient came in complaining of severe rectal pain which sounds like either rectal sphincter spasm or hemorrhoidal pain, however the fact that the patient has hypotension and this is been going on now for over a week makes me concerned for other possibilities including rectal mass, or even rectal infection. Patient has not had a fever, nor does he have an elevated white blood cell count slightly think infection or sepsis  is unlikely, however I discussed obtaining imaging for further evaluation with the patient. Patient is already on dialysis, so CT scan will be given.  After 1 L of normal saline bolus, systolic blood pressure is up to 75. Patient has not really felt symptomatic but he is lying flat. Second liter normal saline is initiated. Dr. Cinda Quest had planned to add vasopressor, however to wait for a second liter bolus.  Patient's main complaint is rectal pain, and I'm unable to give him intravenous narcotics due to his low blood pressure. Topical rectal lidocaine and hydrocortisone for likely hemorrhoids will be given for symptoms.  Patient care transferred to Dr. Archie Balboa at shift change 11 PM. CT scan pending. Due to patient's CT dye allergy of "tremor" patient will be pretreated one hour prior to CT scan with 50 mg of Benadryl and 125 mg of Solu-Medrol. Patient will likely need hospital admission for observation after patient's hypotension.  Patient / Family / Caregiver informed of clinical course, medical decision-making process, and agree with plan.   ____________________________________________   FINAL CLINICAL IMPRESSION(S) / ED DIAGNOSES  Final diagnoses:  Other specified hypotension        Lisa Roca, MD 11/26/14 2231  Lisa Roca, MD 11/26/14 2236

## 2014-11-26 NOTE — ED Provider Notes (Signed)
[facility] Emergency Department Provider Note  ____________________________________________  Time seen: Approximately 9:18 PM  I have reviewed the triage vital signs and the nursing notes.   HISTORY  Chief Complaint Rectal Pain and Hypotension    HPI Patrick Downs is a 39 y.o. male complaining of rectal pain for about a week. He has a sensation of fullness even after he goes to stool. He is not running a fever having nausea vomiting or diarrhea. Stools are normal. They do her to come out though. Today the patient is a sofa totally candy clench his bottom when he did the pain spasm severely. It is still very severe. Patient came in the ER for evaluation of this and was noted to be very hypotensive blood pressure in the 70s. Family with him reports that usually when he is in pain his blood pressure goes up. Patient did have dialysis this morning. Patient reports he sometimes gets lightheaded when he stands but he continues walking he usually does well. Patient still wearing bilateral foot braces for foot drop Guillain-Barr syndrome   Past Medical History  Diagnosis Date  . ESRD (end stage renal disease) on dialysis   . Pneumonia   . Respiratory failure   . Failure to thrive in adult   . Guillain Barr syndrome   . Dialysis patient     Patient Active Problem List   Diagnosis Date Noted  . Encounter for imaging study to confirm nasogastric (NG) tube placement   . Encounter for nasogastric (NG) tube placement   . Respiratory failure, acute   . Guillain Barr syndrome, lower ext weakness 08/27/2014  . CAP (community acquired pneumonia) 08/23/2014  . ESRD on dialysis 08/23/2014  . 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 ARDS (adult respiratory distress syndrome) 08/23/2014  . Septic shock 08/23/2014  . Hypotension 08/23/2014  . Acute respiratory failure 08/23/2014  . Fever   . Sepsis 08/22/2014    Past Surgical History  Procedure Laterality Date  . Av fistula  placement      Current Outpatient Rx  Name  Route  Sig  Dispense  Refill  . acetaminophen (TYLENOL) 325 MG tablet   Oral   Take 2 tablets (650 mg total) by mouth every 4 (four) hours as needed for mild pain or fever (Mild pain (1-3/10) or temp greater than 100.4).   1 tablet   0   . antiseptic oral rinse (CPC / CETYLPYRIDINIUM CHLORIDE 0.05%) 0.05 % LIQD solution   Mouth Rinse   7 mLs by Mouth Rinse route QID.   1 mL   0   . benzonatate (TESSALON) 100 MG capsule   Oral   Take 1 capsule (100 mg total) by mouth every 6 (six) hours as needed for cough (For NONPRODUCTIVE cough).   20 capsule   0   . budesonide (PULMICORT) 0.5 MG/2ML nebulizer solution   Nebulization   Take 2 mLs (0.5 mg total) by nebulization every 12 (twelve) hours.   1 mL   12   . calcium acetate (PHOSLO) 667 MG capsule   Oral   Take 3 capsules (2,001 mg total) by mouth 3 (three) times daily with meals.   1 capsule   0   . calcium-vitamin D (OSCAL WITH D) 500-200 MG-UNIT per tablet   Oral   Take 1 tablet by mouth 2 (two) times daily with a meal.   1 tablet   0   . chlorhexidine (PERIDEX) 0.12 % solution   Mouth Rinse  15 mLs by Mouth Rinse route 2 (two) times daily.   120 mL   0   . docusate (COLACE) 50 MG/5ML liquid   Oral   Take 10 mLs (100 mg total) by mouth every 12 (twelve) hours.   100 mL   0   . famotidine (PEPCID) 20 MG tablet   Oral   Take 1 tablet (20 mg total) by mouth at bedtime.   1 tablet   0   . fentaNYL (SUBLIMAZE) 100 MCG/2ML injection   Intravenous   Inject 2 mLs (100 mcg total) into the vein once.   2 mL   0   . fentaNYL 10 mcg/ml SOLN infusion   Intravenous   Inject 30 mcg/hr into the vein continuous.   1 mL   0   . fluconazole (DIFLUCAN) 400-0.9 MG/200ML-% IVPB   Intravenous   Inject 200 mLs (400 mg total) into the vein every other day.   200 mL   0   . heparin 1000 unit/mL SOLN injection   Dialysis   1 mL (1,000 Units total) by Dialysis route as  needed (in dialysis).   1 mL   0   . heparin 5000 UNIT/ML injection   Subcutaneous   Inject 1 mL (5,000 Units total) into the skin every 12 (twelve) hours.   1 mL   0   . ibuprofen (ADVIL,MOTRIN) 400 MG tablet   Oral   Take 1 tablet (400 mg total) by mouth every 6 (six) hours as needed for fever (Give for fever unrelieved by Tylenol).   30 tablet   0   . ipratropium (ATROVENT) 0.06 % nasal spray   Each Nare   Place 2 sprays into both nostrils 2 (two) times daily.   15 mL   12   . ipratropium-albuterol (DUONEB) 0.5-2.5 (3) MG/3ML SOLN   Nebulization   Take 3 mLs by nebulization every 4 (four) hours.   360 mL   0   . lidocaine, PF, (XYLOCAINE) 1 % SOLN injection   Intradermal   Inject 5 mLs into the skin as needed (topical anesthesia for hemodialysis ifGEBAUERS is ineffective.).   1 mL   0   . lidocaine-prilocaine (EMLA) cream   Topical   Apply 1 application topically as needed (topical anesthesia for hemodialysis if Gebauers and Lidocaine injection are ineffective.).   30 g   0   . meropenem 500 mg in sodium chloride 0.9 % 50 mL   Intravenous   Inject 500 mg into the vein daily.   1 mL   0   . Midazolam HCl (MIDAZOLAM 50MG  IN NS 50ML, 1MG /ML, PREMIX INFUSION)   Intravenous   Inject 1 mg/hr into the vein continuous.   1 mL   0   . multivitamin (RENA-VIT) TABS tablet   Oral   Take 1 tablet by mouth at bedtime.   1 tablet   0   . norepinephrine 16 mg in dextrose 5 % 250 mL   Intravenous   Inject 2 mcg/min into the vein continuous.   1 mL   0   . pentafluoroprop-tetrafluoroeth (GEBAUERS) AERO   Topical   Apply 1 application topically as needed (topical anesthesia for hemodialysis).   1 mL   0   . scopolamine (TRANSDERM-SCOP) 1 MG/3DAYS   Transdermal   Place 1 patch (1.5 mg total) onto the skin every 3 (three) days.   10 patch   12   . sennosides (SENOKOT) 8.8 MG/5ML syrup  Oral   Take 10 mLs by mouth every 12 (twelve) hours.   240 mL   0    . sodium chloride 0.9 % injection   Intravenous   Inject 3-6 mLs into the vein every 10 (ten) minutes as needed.   5 mL   0   . sodium chloride 0.9 % injection   Intracatheter   10-40 mLs by Intracatheter route every 12 (twelve) hours.   5 mL   0   . vancomycin 1,750 mg in sodium chloride 0.9 % 500 mL   Intravenous   Inject 1,750 mg into the vein every Monday, Wednesday, and Friday with hemodialysis.   1 mL   0   . vecuronium (NORCURON) 10 MG injection   Intravenous   Inject 10 mLs (10 mg total) into the vein once.   1 each   0     Allergies Ivp dye; Omnipaque; Minoxidil; and Morphine and related  No family history on file.  Social History History  Substance Use Topics  . Smoking status: Never Smoker   . Smokeless tobacco: Not on file  . Alcohol Use: No    Review of Systems Constitutional: No fever/chills Eyes: No visual changes. ENT: No sore throat. Cardiovascular: Denies chest pain. Respiratory: Denies shortness of breath. Gastrointestinal: No abdominal pain.  No nausea, no vomiting.  No diarrhea.  No constipation. Genitourinary: Negative for dysuria. Musculoskeletal: Negative for back pain. Skin: Negative for rash. Neurological: Negative for headaches, focal weakness or numbness.  10-point ROS otherwise negative.  ____________________________________________   PHYSICAL EXAM:  VITAL SIGNS: ED Triage Vitals  Enc Vitals Group     BP 11/26/14 2033 80/36 mmHg     Pulse Rate 11/26/14 2033 100     Resp 11/26/14 2033 20     Temp 11/26/14 2033 97.4 F (36.3 C)     Temp Source 11/26/14 2033 Oral     SpO2 11/26/14 2033 100 %     Weight 11/26/14 2033 224 lb 13.9 oz (102 kg)     Height 11/26/14 2033 6\' 2"  (1.88 m)     Head Cir --      Peak Flow --      Pain Score 11/26/14 2034 10     Pain Loc --      Pain Edu? --      Excl. in [place]? --    Constitutional: Alert and oriented. Well appearing and in no acute distress. Eyes: Conjunctivae are normal.  PERRL. EOMI. Head: Atraumatic. Nose: No congestion/rhinnorhea. Mouth/Throat: Mucous membranes are moist.  Oropharynx non-erythematous. Neck: No stridor.   Cardiovascular: Normal rate, regular rhythm. Grossly normal heart sounds.  Good peripheral circulation. Respiratory: Normal respiratory effort.  No retractions. Lungs CTAB. Gastrointestinal: Soft and nontender. No distention. No abdominal bruits. No CVA tenderness. Musculoskeletal: No lower extremity tenderness nor edema.  No joint effusions. RECTAL: The area immediately around the rectum is tender. It is not swollen or no ulcers. There is no hemorrhoids. There is a lot of pain however and I cannot put my finger near there without him complaining of severe pain.  ____________________________________________   LABS (all labs ordered are listed, but only abnormal results are displayed)  Labs Reviewed  COMPREHENSIVE METABOLIC PANEL - Abnormal; Notable for the following:    Chloride 92 (*)    Glucose, Bld 104 (*)    BUN 38 (*)    Creatinine, Ser 8.50 (*)    Total Protein 8.5 (*)    GFR calc non Af  Amer 7 (*)    GFR calc Af Amer 8 (*)    Anion gap 17 (*)    All other components within normal limits  CBC - Abnormal; Notable for the following:    WBC 11.1 (*)    RDW 21.1 (*)    All other components within normal limits  DIFFERENTIAL - Abnormal; Notable for the following:    Neutro Abs 7.1 (*)    Monocytes Absolute 1.3 (*)    All other components within normal limits  TROPONIN I  LACTIC ACID, PLASMA  LACTIC ACID, PLASMA  TYPE AND SCREEN   ____________________________________________  EKG  EKG read and interpreted by me shows normal sinus rhythm at a rate of 87. There are indeterminant spikes marching through the EKG that look like patient spikes but do not appear to be pacer spikes patient does not have a pacemaker. I believe these are artifact of some type ____________________________________________  RADIOLOGY  Chest  x-ray read by radiology as no acute disease actually looks almost slightly CHFy ____________________________________________   PROCEDURES    ____________________________________________   INITIAL IMPRESSION / ASSESSMENT AND PLAN / ED COURSE  Pertinent labs & imaging results that were available during my care of the patient were reviewed by me and considered in my medical decision making (see chart for details). Patient is currently finishing his first liter of IV fluid blood pressure was 75 systolic I am signing this patient out to Dr. Reita Cliche who gone over the labs and rest the patient with in detail.     ____________________________________________   FINAL CLINICAL IMPRESSION(S) / ED DIAGNOSES  Final diagnoses:  Other specified hypotension      Nena Polio, MD 11/26/14 2153

## 2014-11-26 NOTE — ED Notes (Signed)
Pt reports rectal pain x1 week, reports some blood with bowel movements. Pt alert and oriented in triage room, able to speak in full sentences. Pt reports his BP was 90's low in diaylsis today.

## 2014-11-26 NOTE — ED Notes (Addendum)
1st liter of NS completed at 1020pm. 2nd liter bolus started as noted on MAR.

## 2014-11-27 ENCOUNTER — Emergency Department: Payer: Medicare Other

## 2014-11-27 ENCOUNTER — Inpatient Hospital Stay
Admit: 2014-11-27 | Discharge: 2014-11-27 | Disposition: A | Discharge status: Home / Self Care | Payer: Medicare Other | Attending: Nephrology | Admitting: Nephrology

## 2014-11-27 DIAGNOSIS — M21379 Foot drop, unspecified foot: Secondary | ICD-10-CM | POA: Diagnosis present

## 2014-11-27 DIAGNOSIS — N186 End stage renal disease: Secondary | ICD-10-CM | POA: Diagnosis present

## 2014-11-27 DIAGNOSIS — R0902 Hypoxemia: Secondary | ICD-10-CM | POA: Diagnosis present

## 2014-11-27 DIAGNOSIS — Z79899 Other long term (current) drug therapy: Secondary | ICD-10-CM | POA: Diagnosis not present

## 2014-11-27 DIAGNOSIS — Z888 Allergy status to other drugs, medicaments and biological substances status: Secondary | ICD-10-CM | POA: Diagnosis not present

## 2014-11-27 DIAGNOSIS — Z833 Family history of diabetes mellitus: Secondary | ICD-10-CM | POA: Diagnosis not present

## 2014-11-27 DIAGNOSIS — I340 Nonrheumatic mitral (valve) insufficiency: Secondary | ICD-10-CM | POA: Diagnosis present

## 2014-11-27 DIAGNOSIS — K6289 Other specified diseases of anus and rectum: Secondary | ICD-10-CM | POA: Insufficient documentation

## 2014-11-27 DIAGNOSIS — I959 Hypotension, unspecified: Secondary | ICD-10-CM | POA: Diagnosis present

## 2014-11-27 DIAGNOSIS — G610 Guillain-Barre syndrome: Secondary | ICD-10-CM | POA: Diagnosis present

## 2014-11-27 DIAGNOSIS — Z7682 Awaiting organ transplant status: Secondary | ICD-10-CM | POA: Diagnosis not present

## 2014-11-27 DIAGNOSIS — Z841 Family history of disorders of kidney and ureter: Secondary | ICD-10-CM | POA: Diagnosis not present

## 2014-11-27 DIAGNOSIS — Z992 Dependence on renal dialysis: Secondary | ICD-10-CM | POA: Diagnosis not present

## 2014-11-27 DIAGNOSIS — Z91041 Radiographic dye allergy status: Secondary | ICD-10-CM | POA: Diagnosis not present

## 2014-11-27 DIAGNOSIS — N2581 Secondary hyperparathyroidism of renal origin: Secondary | ICD-10-CM | POA: Diagnosis present

## 2014-11-27 DIAGNOSIS — I120 Hypertensive chronic kidney disease with stage 5 chronic kidney disease or end stage renal disease: Secondary | ICD-10-CM | POA: Diagnosis present

## 2014-11-27 DIAGNOSIS — E785 Hyperlipidemia, unspecified: Secondary | ICD-10-CM | POA: Diagnosis present

## 2014-11-27 DIAGNOSIS — D631 Anemia in chronic kidney disease: Secondary | ICD-10-CM | POA: Diagnosis present

## 2014-11-27 DIAGNOSIS — I9589 Other hypotension: Secondary | ICD-10-CM | POA: Diagnosis present

## 2014-11-27 LAB — CBC
HEMATOCRIT: 38.1 % — AB (ref 40.0–52.0)
Hemoglobin: 12.6 g/dL — ABNORMAL LOW (ref 13.0–18.0)
MCH: 27.4 pg (ref 26.0–34.0)
MCHC: 33 g/dL (ref 32.0–36.0)
MCV: 83.2 fL (ref 80.0–100.0)
Platelets: 128 10*3/uL — ABNORMAL LOW (ref 150–440)
RBC: 4.58 MIL/uL (ref 4.40–5.90)
RDW: 20.6 % — ABNORMAL HIGH (ref 11.5–14.5)
WBC: 10.2 10*3/uL (ref 3.8–10.6)

## 2014-11-27 LAB — BASIC METABOLIC PANEL
Anion gap: 18 — ABNORMAL HIGH (ref 5–15)
BUN: 47 mg/dL — ABNORMAL HIGH (ref 6–20)
CALCIUM: 9 mg/dL (ref 8.9–10.3)
CHLORIDE: 96 mmol/L — AB (ref 101–111)
CO2: 21 mmol/L — ABNORMAL LOW (ref 22–32)
Creatinine, Ser: 9.31 mg/dL — ABNORMAL HIGH (ref 0.61–1.24)
GFR calc Af Amer: 7 mL/min — ABNORMAL LOW (ref >60)
GFR, EST NON AFRICAN AMERICAN: 6 mL/min — AB (ref >60)
Glucose, Bld: 174 mg/dL — ABNORMAL HIGH (ref 65–99)
POTASSIUM: 5.2 mmol/L — AB (ref 3.5–5.1)
Sodium: 135 mmol/L (ref 135–145)

## 2014-11-27 LAB — CREATININE, SERUM
Creatinine, Ser: 9.57 mg/dL — ABNORMAL HIGH (ref 0.61–1.24)
GFR calc non Af Amer: 6 mL/min — ABNORMAL LOW (ref >60)
GFR, EST AFRICAN AMERICAN: 7 mL/min — AB (ref >60)

## 2014-11-27 LAB — MRSA PCR SCREENING: MRSA BY PCR: NEGATIVE

## 2014-11-27 LAB — TROPONIN I: Troponin I: <0.03 ng/mL (ref <0.031)

## 2014-11-27 LAB — LACTIC ACID, PLASMA: LACTIC ACID, VENOUS: 2.3 mmol/L — AB (ref 0.5–2.0)

## 2014-11-27 MED ORDER — IOHEXOL 300 MG/ML  SOLN
100.0000 mL | Freq: Once | INTRAMUSCULAR | Status: AC | PRN
  Administered 2014-11-27: 100 mL via INTRAVENOUS

## 2014-11-27 MED ORDER — POLYETHYLENE GLYCOL 3350 17 G PO PACK
17.0000 g | PACK | Freq: Every day | ORAL | Status: DC
  Administered 2014-11-27 – 2014-11-28 (×2): 17 g via ORAL
  Filled 2014-11-27 (×2): qty 1

## 2014-11-27 MED ORDER — SODIUM CHLORIDE 0.9 % IV SOLN
Freq: Once | INTRAVENOUS | Status: AC
  Administered 2014-11-27: 02:00:00 via INTRAVENOUS

## 2014-11-27 MED ORDER — MIDODRINE HCL 5 MG PO TABS
5.0000 mg | ORAL_TABLET | Freq: Three times a day (TID) | ORAL | Status: DC
  Administered 2014-11-27 – 2014-11-28 (×4): 5 mg via ORAL
  Filled 2014-11-27 (×4): qty 1

## 2014-11-27 MED ORDER — HEPARIN SODIUM (PORCINE) 5000 UNIT/ML IJ SOLN
5000.0000 [IU] | Freq: Three times a day (TID) | INTRAMUSCULAR | Status: DC
  Administered 2014-11-27 – 2014-11-28 (×4): 5000 [IU] via SUBCUTANEOUS
  Filled 2014-11-27 (×4): qty 1

## 2014-11-27 MED ORDER — CALCIUM CARBONATE-VITAMIN D 500-200 MG-UNIT PO TABS
1.0000 | ORAL_TABLET | Freq: Two times a day (BID) | ORAL | Status: DC
  Administered 2014-11-27 (×2): 1 via ORAL
  Filled 2014-11-27 (×3): qty 1

## 2014-11-27 MED ORDER — CALCIUM ACETATE (PHOS BINDER) 667 MG PO CAPS
2001.0000 mg | ORAL_CAPSULE | Freq: Three times a day (TID) | ORAL | Status: DC
  Administered 2014-11-27 – 2014-11-28 (×4): 2001 mg via ORAL
  Filled 2014-11-27 (×4): qty 3

## 2014-11-27 MED ORDER — HYDROCORTISONE 2.5 % RE CREA
TOPICAL_CREAM | Freq: Three times a day (TID) | RECTAL | Status: DC
  Administered 2014-11-27 – 2014-11-28 (×2): via RECTAL
  Filled 2014-11-27 (×2): qty 28.35

## 2014-11-27 MED ORDER — OXYCODONE-ACETAMINOPHEN 5-325 MG PO TABS
1.0000 | ORAL_TABLET | Freq: Four times a day (QID) | ORAL | Status: DC | PRN
  Administered 2014-11-27 (×2): 1 via ORAL
  Administered 2014-11-27: 2 via ORAL
  Administered 2014-11-27: 1 via ORAL
  Administered 2014-11-27: 2 via ORAL
  Administered 2014-11-28: 1 via ORAL
  Filled 2014-11-27 (×5): qty 1
  Filled 2014-11-27: qty 2
  Filled 2014-11-27: qty 1

## 2014-11-27 NOTE — H&P (Signed)
[facility] [place] NAME: Patrick Downs    MR#:  [id number]  DATE OF BIRTH:  [birth year]  DATE OF ADMISSION:  11/26/2014  PRIMARY CARE PHYSICIAN: Patrick Prows, MD   REQUESTING/REFERRING PHYSICIAN: Archie Downs  CHIEF COMPLAINT:   Chief Complaint  Patient presents with  . Rectal Pain  . Hypotension    HISTORY OF PRESENT ILLNESS:  Patrick Downs  is a 39 y.o. male who presents with rectal pain. Patient states that this pain has been slowly progressing over the last week, not today. She pointed he could not longer tolerate it so he came to the ED for evaluation. States that the rectal pain feels like it is at his anus, just to the inside, and it is an aching throbbing pain, rated as 10 out of 10. He states that he did notice just a little bit of blood on the toilet paper after he had a bowel movement today, but he denies any melena or grossly bloody stool. On evaluation in the ED he was found to be significantly hypotensive, with his blood pressure ranging 123XX123 to 0000000 systolic and 123456 and 0000000 diastolic. He states that he did just have an dialysis prior to coming to the ED, and that he feels asymptomatic with this low blood pressure, but that it is lower than his normal blood pressure which typically runs systolic 123XX123 to 0000000. He got 2 L of fluid in the ED and his blood pressure still has not improved, so hospitalists were called for admission for hypotension. Of note he was given external topical lidocaine and rectal hydrocortisone in the ED without much improvement to his rectal pain. Also of note, he does have a diagnosis fairly recently of Barr syndrome with bilateral foot orthotics.  PAST MEDICAL HISTORY:   Past Medical History  Diagnosis Date  . ESRD (end stage renal disease) on dialysis   . Pneumonia   . Respiratory failure   . Failure to thrive in adult   . Guillain Barr syndrome   . Dialysis patient   . Renal insufficiency      PAST SURGICAL HISTORY:   Past Surgical History  Procedure Laterality Date  . Av fistula placement      SOCIAL HISTORY:   History  Substance Use Topics  . Smoking status: Never Smoker   . Smokeless tobacco: Not on file  . Alcohol Use: No    FAMILY HISTORY:   Family History  Problem Relation Age of Onset  . Diabetes Mellitus II Father   . Kidney disease Father     DRUG ALLERGIES:   Allergies  Allergen Reactions  . Ivp Dye [Iodinated Diagnostic Agents] Other (See Comments)    Reaction unknown  . Omnipaque [Iohexol] Other (See Comments)    Reaction unknown  . Minoxidil Other (See Comments)    Reaction unknown  . Morphine And Related Other (See Comments)    Reaction unknown    MEDICATIONS AT HOME:   Prior to Admission medications   Medication Sig Start Date End Date Taking? Authorizing Provider  calcium acetate (PHOSLO) 667 MG capsule Take 3 capsules (2,001 mg total) by mouth 3 (three) times daily with meals. 08/26/14  Yes [name], MD  calcium-vitamin D (OSCAL WITH D) 500-200 MG-UNIT per tablet Take 1 tablet by mouth 2 (two) times daily with a meal. 08/26/14  Yes [name], MD  gabapentin (NEURONTIN) 300 MG capsule Take 300 mg by mouth 2 (two) times daily.  Yes Historical Provider, MD  multivitamin (RENA-VIT) TABS tablet Take 1 tablet by mouth at bedtime. 08/26/14  Yes [name], MD  acetaminophen (TYLENOL) 325 MG tablet Take 2 tablets (650 mg total) by mouth every 4 (four) hours as needed for mild pain or fever (Mild pain (1-3/10) or temp greater than 100.4). 08/26/14   [name], MD  antiseptic oral rinse (CPC / CETYLPYRIDINIUM CHLORIDE 0.05%) 0.05 % LIQD solution 7 mLs by Mouth Rinse route QID. 08/26/14   [name], MD  ibuprofen (ADVIL,MOTRIN) 400 MG tablet Take 1 tablet (400 mg total) by mouth every 6 (six) hours as needed for fever (Give for fever unrelieved by Tylenol). 08/26/14   [name], MD    REVIEW OF SYSTEMS:  Review of  Systems  Constitutional: Negative for fever, chills, weight loss and malaise/fatigue.  HENT: Negative for ear pain, hearing loss and tinnitus.   Eyes: Negative for blurred vision, double vision, pain and redness.  Respiratory: Negative for cough, hemoptysis and shortness of breath.   Cardiovascular: Negative for chest pain, palpitations, orthopnea and leg swelling.  Gastrointestinal: Negative for nausea, vomiting, abdominal pain, diarrhea and constipation.       Rectal pain  Genitourinary: Negative for dysuria, frequency and hematuria.  Musculoskeletal: Negative for back pain, joint pain and neck pain.  Skin:       No acne, rash, or lesions  Neurological: Negative for dizziness, tremors, focal weakness and weakness.  Endo/Heme/Allergies: Negative for polydipsia. Does not bruise/bleed easily.  Psychiatric/Behavioral: Negative for depression. The patient is not nervous/anxious and does not have insomnia.      VITAL SIGNS:   Filed Vitals:   11/26/14 2033 11/27/14 0046  BP: 80/36 90/64  Pulse: 100 84  Temp: 97.4 F (36.3 C)   TempSrc: Oral   Resp: 20 18  Height: 6\' 2"  (1.88 m)   Weight: 102 kg (224 lb 13.9 oz)   SpO2: 100% 100%   Wt Readings from Last 3 Encounters:  11/26/14 102 kg (224 lb 13.9 oz)  10/30/14 97.523 kg (215 lb)  08/26/14 132.6 kg (292 lb 5.3 oz)    PHYSICAL EXAMINATION:  Physical Exam  Vitals reviewed. Constitutional: He is oriented to person, place, and time. He appears well-developed and well-nourished. No distress.  HENT:  Head: Normocephalic and atraumatic.  Mouth/Throat: Oropharynx is clear and moist.  Eyes: Conjunctivae and EOM are normal. Pupils are equal, round, and reactive to light. No scleral icterus.  Neck: Normal range of motion. Neck supple. No JVD present. No thyromegaly present.  Cardiovascular: Normal rate, regular rhythm and intact distal pulses.  Exam reveals no gallop and no friction rub.   No murmur heard. Respiratory: Effort normal and  breath sounds normal. No respiratory distress. He has no wheezes. He has no rales.  GI: Soft. Bowel sounds are normal. He exhibits no distension. There is no tenderness.  Genitourinary:  Significant rectal tenderness on palpation, even to light touch, no obvious skin discolorations or rashes or excoriations, no hemorrhoids visualized. Exam largely within normal limits, except for his tenderness.  Musculoskeletal: Normal range of motion. He exhibits no edema.  Bilateral foot orthotics in place. No arthritis, no gout  Lymphadenopathy:    He has no cervical adenopathy.  Neurological: He is alert and oriented to person, place, and time. No cranial nerve deficit.  No dysarthria, no aphasia  Skin: Skin is warm and dry. No rash noted. No erythema.  Psychiatric: He has a normal mood and affect. His behavior is normal.  Judgment and thought content normal.    LABORATORY PANEL:   CBC  Recent Labs Lab 11/26/14 2043  WBC 11.1*  HGB 14.0  HCT 42.6  PLT 173   ------------------------------------------------------------------------------------------------------------------  Chemistries   Recent Labs Lab 11/26/14 2043  NA 137  K 4.1  CL 92*  CO2 28  GLUCOSE 104*  BUN 38*  CREATININE 8.50*  CALCIUM 10.2  AST 25  ALT 23  ALKPHOS 83  BILITOT 0.4   ------------------------------------------------------------------------------------------------------------------  Cardiac Enzymes No results for input(s): TROPONINI in the last 168 hours. ------------------------------------------------------------------------------------------------------------------  RADIOLOGY:  Ct Pelvis W Contrast  11/27/2014   CLINICAL DATA:  Rectal pain.  Hypotension.  EXAM: CT PELVIS WITH CONTRAST  TECHNIQUE: Multidetector CT imaging of the pelvis was performed using the standard protocol following the bolus administration of intravenous contrast.  CONTRAST:  171mL OMNIPAQUE IOHEXOL 300 MG/ML SOLN intravenous,  without contrast reaction. Patient was premedicated with the 1 hour emergent prep protocol.  COMPARISON:  None.  FINDINGS: There are unremarkable appearances of the rectum, perirectal space and perineum. There is no abscess. There is no inflammatory change. No mass is evident. Remainder of the visible portions of the colon are also unremarkable.  There is no pelvic adenopathy. There is no abnormal fluid collection. Prostate and seminal vesicles appear grossly unremarkable. Visible portions of the small bowel are normal in appearance. Urinary bladder is empty and grossly unremarkable in appearance.  IMPRESSION: Negative for significant abnormality.   Electronically Signed   By: Andreas Newport M.D.   On: 11/27/2014 01:37   Dg Chest Portable 1 View  11/26/2014   CLINICAL DATA:  Weakness and rectal pain for 1 week.  EXAM: PORTABLE CHEST - 1 VIEW  COMPARISON:  Single view of the chest 09/21/2014 and 06/06/2013.  FINDINGS: The lungs are clear. Heart size is normal. No pneumothorax or pleural effusion.  IMPRESSION: No acute disease.   Electronically Signed   By: Inge Rise M.D.   On: 11/26/2014 21:33    EKG:   Orders placed or performed during the hospital encounter of 11/26/14  . ED EKG  . ED EKG    IMPRESSION AND PLAN:  Principal Problem:   Hypotension - unclear exactly what the patient's baseline is, however he is anxious about receiving much more in the way of fluids. 2 L given in the ED, with another liter ordered at 100 mL per hour. We'll continue fluids for now, hold off on vasopressin as patient is asymptomatic and there are no other clinical indicators for vasopressors at this time. Patient has no infectious symptomology. Consult nephrology as he is a dialysis patient to see if they have any further recommendations. Active Problems:   Rectal pain - unclear etiology, though suspect that he does have some hemorrhoids given his minimal bleeding after stool. However, the degree of pain that he  complains of seems out of proportion to any physical examination of potential hemorrhoids. CT abdomen and pelvis was largely unremarkable for any pathology explaining his pain. We will treat his pain for now, and continue his rectal Anusol and see if he improves.   ESRD on dialysis - consult nephrology as above. Continue home medications, and avoid nephrotoxins.   Guillain Barr syndrome, lower ext weakness - continue lower extremity orthotics.  All the records are reviewed and case discussed with ED provider. Management plans discussed with the patient and/or family.  DVT PROPHYLAXIS: SubQ heparin  ADMISSION STATUS: Observation  CODE STATUS: Full  TOTAL TIME TAKING CARE  OF THIS PATIENT: 40 minutes.    Shakia Sebastiano In[place] 11/27/2014, 2:22 AM  [facility]  Office  [phone]  CC: Primary care physician; Patrick Prows, MD

## 2014-11-27 NOTE — Care Management Note (Signed)
Case Management Note  Patient Details  Name: RUSHI CHASEN MRN: [id number] Date of Birth: 1975-06-29  Subjective/Objective:                  Met with patient and his girlfriend to discuss discharge planning. Patient states he is independent with mobility but uses special shoes with braces to ambulate. He states his PCP is Dr. Ola Spurr. He states he has not problems obtaining Rx or transportation. He goes to [facility] dialysis T, TH, Sat. He has history of home health through [facility]- closed out last Thursday. Patient denies need for home health services.  Action/Plan: [facility] Liaison with dialysis notified of patient admission for dialysis coordination. No RNCM needs at this time.  Expected Discharge Date:                  Expected Discharge Plan:     In-House Referral:     Discharge planning Services  CM Consult  Post Acute Care Choice:    Choice offered to:  Patient, Spouse  DME Arranged:  N/A DME Agency:     HH Arranged:  NA HH Agency:     Status of Service:  Completed, signed off  Medicare Important Message Given:    Date Medicare IM Given:    Medicare IM give by:    Date Additional Medicare IM Given:    Additional Medicare Important Message give by:     If discussed at [place] of Stay Meetings, dates discussed:    Additional Comments:  Marshell Garfinkel, RN 11/27/2014, 10:39 AM

## 2014-11-27 NOTE — Care Management Note (Signed)
Patient is active at [facility] MWF.  I will keep clinic updated with current medical records during this admission [facility]  D[facility] Liaison  [phone]

## 2014-11-27 NOTE — Progress Notes (Signed)
[facility] [place] NAME: Patrick Downs    MR#:  [id number]  DATE OF BIRTH:  February 28, 1976  SUBJECTIVE:  CHIEF COMPLAINT:  Patient is resting comfortably, reporting rectal pain, denies any dizziness Reporting that his systolic blood pressure is running around 90s for the past several weeks and his blood pressure medications were discontinued recently  REVIEW OF SYSTEMS:  CONSTITUTIONAL: No fever, fatigue or weakness.  EYES: No blurred or double vision.  EARS, NOSE, AND THROAT: No tinnitus or ear pain.  RESPIRATORY: No cough, shortness of breath, wheezing or hemoptysis.  CARDIOVASCULAR: No chest pain, orthopnea, edema.  GASTROINTESTINAL: No nausea, vomiting, diarrhea or abdominal pain.  GENITOURINARY: No dysuria, hematuria. Reporting rectal pain ENDOCRINE: No polyuria, nocturia,  HEMATOLOGY: No anemia, easy bruising or bleeding SKIN: No rash or lesion. MUSCULOSKELETAL: No joint pain or arthritis.   NEUROLOGIC: No tingling, numbness, weakness.  PSYCHIATRY: No anxiety or depression.   DRUG ALLERGIES:   Allergies  Allergen Reactions  . Ivp Dye [Iodinated Diagnostic Agents] Other (See Comments)    Reaction unknown  . Omnipaque [Iohexol] Other (See Comments)    Reaction unknown  . Minoxidil Other (See Comments)    Reaction unknown  . Morphine And Related Other (See Comments)    Reaction unknown    VITALS:  Blood pressure 73/54, pulse 78, temperature 97.5 F (36.4 C), temperature source Oral, resp. rate 14, height 6\' 2"  (1.88 m), weight 104.5 kg (230 lb 6.1 oz), SpO2 95 %.  PHYSICAL EXAMINATION:  GENERAL:  39 y.o.-year-old patient lying in the bed with no acute distress.  EYES: Pupils equal, round, reactive to light and accommodation. No scleral icterus. Extraocular muscles intact.  HEENT: Head atraumatic, normocephalic. Oropharynx and nasopharynx clear.  NECK:  Supple, no jugular venous distention. No thyroid enlargement, no  tenderness.  LUNGS: Normal breath sounds bilaterally, no wheezing, rales,rhonchi or crepitation. No use of accessory muscles of respiration.  CARDIOVASCULAR: S1, S2 normal. No murmurs, rubs, or gallops.  ABDOMEN: Soft, nontender, nondistended. Bowel sounds present. No organomegaly or mass.  EXTREMITIES: No pedal edema, cyanosis, or clubbing.  NEUROLOGIC: Cranial nerves II through XII are intact. Muscle strength 5/5 in all extremities. Sensation intact. Gait not checked.  PSYCHIATRIC: The patient is alert and oriented x 3.  SKIN: No obvious rash, lesion, or ulcer.    LABORATORY PANEL:   CBC  Recent Labs Lab 11/27/14 0603  WBC 10.2  HGB 12.6*  HCT 38.1*  PLT 128*   ------------------------------------------------------------------------------------------------------------------  Chemistries   Recent Labs Lab 11/26/14 2043 11/27/14 0603  NA 137 135  K 4.1 5.2*  CL 92* 96*  CO2 28 21*  GLUCOSE 104* 174*  BUN 38* 47*  CREATININE 8.50* 9.31*  9.57*  CALCIUM 10.2 9.0  AST 25  --   ALT 23  --   ALKPHOS 83  --   BILITOT 0.4  --    ------------------------------------------------------------------------------------------------------------------  Cardiac Enzymes  Recent Labs Lab 11/27/14 0603  TROPONINI <0.03   ------------------------------------------------------------------------------------------------------------------  RADIOLOGY:  Ct Pelvis W Contrast  11/27/2014   CLINICAL DATA:  Rectal pain.  Hypotension.  EXAM: CT PELVIS WITH CONTRAST  TECHNIQUE: Multidetector CT imaging of the pelvis was performed using the standard protocol following the bolus administration of intravenous contrast.  CONTRAST:  135mL OMNIPAQUE IOHEXOL 300 MG/ML SOLN intravenous, without contrast reaction. Patient was premedicated with the 1 hour emergent prep protocol.  COMPARISON:  None.  FINDINGS: There are unremarkable appearances of the rectum,  perirectal space and perineum. There is no  abscess. There is no inflammatory change. No mass is evident. Remainder of the visible portions of the colon are also unremarkable.  There is no pelvic adenopathy. There is no abnormal fluid collection. Prostate and seminal vesicles appear grossly unremarkable. Visible portions of the small bowel are normal in appearance. Urinary bladder is empty and grossly unremarkable in appearance.  IMPRESSION: Negative for significant abnormality.   Electronically Signed   By: Andreas Newport M.D.   On: 11/27/2014 01:37   Dg Chest Portable 1 View  11/26/2014   CLINICAL DATA:  Weakness and rectal pain for 1 week.  EXAM: PORTABLE CHEST - 1 VIEW  COMPARISON:  Single view of the chest 09/21/2014 and 06/06/2013.  FINDINGS: The lungs are clear. Heart size is normal. No pneumothorax or pleural effusion.  IMPRESSION: No acute disease.   Electronically Signed   By: Inge Rise M.D.   On: 11/26/2014 21:33    EKG:   Orders placed or performed during the hospital encounter of 11/26/14  . ED EKG  . ED EKG  . EKG 12-Lead  . EKG 12-Lead    ASSESSMENT AND PLAN:   Hypotension -  the patient is asymptomatic ,  reporting low blood pressure with systolic blood pressure running around 90s for the past several weeks  Patient is started on midodrine Will provide IV pressors as needed basis, received 2 and half liters of IV fluids in the ED   Active Problems:  Rectal pain - unclear etiology, though suspect that he does have some hemorrhoids given his minimal bleeding after stool.   CT abdomen and pelvis was largely unremarkable for any pathology explaining his pain.  GI consult is placed which is pending Provide pain management as needed basis   ESRD on dialysis - appreciate nephrology recommendations . Continue home medications, and avoid nephrotoxins.    Guillain Barr syndrome, lower ext weakness - continue lower extremity orthotics.      All the records are reviewed and case discussed with Care  Management/Social Workerr. Management plans discussed with the patient, family and they are in agreement.  CODE STATUS:   Full codeTOTAL TIME TAKING CARE OF THIS PATIENT:  the 83minutes.   POSSIBLE D/C IN2-3  DAYS, DEPENDING ON CLINICAL CONDITION.   Nicholes Mango M.D on 11/27/2014 at 4:12 PM  Between 7am to 6pm - Pager - [phone] After 6pm go to [email or web address] - [email or web address] [facility]  [place] [facility]  Office  [phone]  CC: Primary care physician; Adrian Prows, MD

## 2014-11-27 NOTE — Progress Notes (Signed)
Nutrition Follow-up  INTERVENTION:   Meals and Snacks: Cater to patient preferences; pt with very good appetite with increased needs at baseline due to chronic illness; recommend double portions, in particular protein sources, and will send bedtime snack  NUTRITION DIAGNOSIS:   No nutrition diagnosis at this time  GOAL:   Patient will meet greater than or equal to 90% of their needs   MONITOR:    (Energy Intake, Anthropometrics, Electrolyte/Renal Profile, Glucose Profile)  REASON FOR ASSESSMENT:    (Renal Diet, Dialysis)    ASSESSMENT:    Pt admitted with hypotension, rectal pain; pt ESRD on HD, noted GI consult peding  Past Medical History  Diagnosis Date  . ESRD (end stage renal disease) on dialysis   . Pneumonia   . Respiratory failure   . Failure to thrive in adult   . Guillain Barr syndrome   . Dialysis patient   . Renal insufficiency     Diet Order:  Diet renal with fluid restriction Fluid restriction:: 1200 mL Fluid; Room service appropriate?: Yes; Fluid consistency:: Thin   Energy Intake: pt with very good appetite, reports he is hungry on visit today; noted eaten sandwich boxed meal at bedside; pt reports he did not eat much breakfast this AM as pt did not like  Food and nutrition related history: pt reports very good appetite at home, eating well. Pt reports he does not follow a dialysis diet at home, eats a regular diet. Pt reports he occasionally has a high phosphorus level but this is when he does not take his phoslo. Reports potassium is always normal.   Skin:  Reviewed, no issues   Nutrition Focused Physical Exam: Nutrition-Focused physical exam completed. Findings are WDL for fat depletion, muscle depletion, and edema.   Electrolyte and Renal Profile:  Recent Labs Lab 11/26/14 2043 11/27/14 0603  BUN 38* 47*  CREATININE 8.50* 9.31*  9.57*  NA 137 135  K 4.1 5.2*   Glucose Profile: No results for input(s): GLUCAP in the last 72  hours. Protein Profile:  Recent Labs Lab 11/26/14 2043  ALBUMIN 4.2   Meds: phoslo, calcium-vitamin D, solumedorl, percocet  Last BM:  8/5   Height:   Ht Readings from Last 1 Encounters:  11/27/14 6\' 2"  (1.88 m)    Weight: pt reports dry weight has been stable and reports minimal intra-dialytic weight gains; reports he is very good at limiting his fluid intake  Wt Readings from Last 1 Encounters:  11/27/14 230 lb 6.1 oz (104.5 kg)   Filed Weights   11/26/14 2033 11/27/14 0400  Weight: 224 lb 13.9 oz (102 kg) 230 lb 6.1 oz (104.5 kg)   Wt Readings from Last 10 Encounters:  11/27/14 230 lb 6.1 oz (104.5 kg)  10/30/14 215 lb (97.523 kg)  08/26/14 292 lb 5.3 oz (132.6 kg)    BMI:  Body mass index is 29.57 kg/(m^2).  LOW Care Level  Kerman Passey MS, [place], LDN [phone] Pager

## 2014-11-27 NOTE — Consult Note (Signed)
GI Inpatient Consult Note  Reason for Consult: Rectal pain   Attending Requesting Consult:Dr. Kolluru  History of Present Illness: Patrick Downs is a 39 y.o. male reports he started having rectal pain approximately 1 week ago.  He reports it started with some general soreness when he would have a bowel movement. He reports it would not hurt when he had the bowel movement but would hurt following.  He reports that yesterday his bowel movement was soft and when he was finished he felt like he had to have another bowel movement but nothing would come out.  He reports that he clinched his rectum and had extreme pain.  He reports then later that day he felt like he had to pass gas and when he did he thought he was passing some stool and again clenched his rectum and experienced extreme pain that did not resolve.  He reports after that episode he did wipe with some tissue and saw a little bit of pink blood on the tissue.   He reported to the emergency room were he was given Anusol cream as well as lidocaine jelly with little relief.  He reports he takes senna on a daily basis and has 1-2 normal soft bowel movements daily. He denies any history of hemorrhoids.  He had a flexible sigmoidoscopy completed on February 06, 2014.  Dr. Vira Agar perform this procedure.  Indication was diarrhea as an inpatient.  Findings ; perianal examination was normal.  The rectum appeared normal.  He was hypotensive when he reported to the emergency department.  He reports that he had been taking metoprolol.  He reports that he feels fine.  His blood pressure was 76/46 after being given fluids  His past medical history significant for end-stage renal disease on dialysis for the past 17 years.  His dialysis is Tuesdays, Thursdays, and Saturdays.  He is currently working on being placed on the transplant list at [facility].  He also was diagnosed with Mardelle Matte in 2000. He reports he had a small flare up with the recently but  generally does well.  Past Medical History:  Past Medical History  Diagnosis Date  . ESRD (end stage renal disease) on dialysis   . Pneumonia   . Respiratory failure   . Failure to thrive in adult   . Guillain Barr syndrome   . Dialysis patient   . Renal insufficiency     Problem List: Patient Active Problem List   Diagnosis Date Noted  . Rectal pain 11/27/2014  . Encounter for imaging study to confirm nasogastric (NG) tube placement   . Encounter for nasogastric (NG) tube placement   . Respiratory failure, acute   . Guillain Barr syndrome, lower ext weakness 08/27/2014  . CAP (community acquired pneumonia) 08/23/2014  . ESRD on dialysis 08/23/2014  . 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 ARDS (adult respiratory distress syndrome) 08/23/2014  . Septic shock 08/23/2014  . Hypotension 08/23/2014  . Acute respiratory failure 08/23/2014  . Fever   . Sepsis 08/22/2014    Past Surgical History: Past Surgical History  Procedure Laterality Date  . Av fistula placement      Allergies: Allergies  Allergen Reactions  . Ivp Dye [Iodinated Diagnostic Agents] Other (See Comments)    Reaction unknown  . Omnipaque [Iohexol] Other (See Comments)    Reaction unknown  . Minoxidil Other (See Comments)    Reaction unknown  . Morphine And Related Other (See Comments)    Reaction unknown  Home Medications: Prescriptions prior to admission  Medication Sig Dispense Refill Last Dose  . calcium acetate (PHOSLO) 667 MG capsule Take 3 capsules (2,001 mg total) by mouth 3 (three) times daily with meals. 1 capsule 0 11/25/2014 at Unknown time  . calcium-vitamin D (OSCAL WITH D) 500-200 MG-UNIT per tablet Take 1 tablet by mouth 2 (two) times daily with a meal. 1 tablet 0 11/25/2014 at Unknown time  . gabapentin (NEURONTIN) 300 MG capsule Take 300 mg by mouth 2 (two) times daily.   11/26/2014 at Unknown time  . multivitamin (RENA-VIT) TABS tablet Take 1 tablet by mouth at bedtime. 1 tablet 0 11/25/2014 at  Unknown time  . acetaminophen (TYLENOL) 325 MG tablet Take 2 tablets (650 mg total) by mouth every 4 (four) hours as needed for mild pain or fever (Mild pain (1-3/10) or temp greater than 100.4). 1 tablet 0 PRN at PRN  . antiseptic oral rinse (CPC / CETYLPYRIDINIUM CHLORIDE 0.05%) 0.05 % LIQD solution 7 mLs by Mouth Rinse route QID. 1 mL 0 PRN at PRN  . ibuprofen (ADVIL,MOTRIN) 400 MG tablet Take 1 tablet (400 mg total) by mouth every 6 (six) hours as needed for fever (Give for fever unrelieved by Tylenol). 30 tablet 0 PRN at PRN   Home medication reconciliation was completed with the patient.   Scheduled Inpatient Medications:   . calcium acetate  2,001 mg Oral TID WC  . calcium-vitamin D  1 tablet Oral BID WC  . heparin  5,000 Units Subcutaneous 3 times per day  . midodrine  5 mg Oral TID WC    Continuous Inpatient Infusions:     PRN Inpatient Medications:  oxyCODONE-acetaminophen  Family History: family history includes Diabetes Mellitus II in his father; Kidney disease in his father.    Social History:   reports that he has never smoked. He does not have any smokeless tobacco history on file. He reports that he does not drink alcohol or use illicit drugs.   Review of Systems: Constitutional: Weight is stable.  Eyes: No changes in vision. ENT: No oral lesions, sore throat.  GI: see HPI.  Heme/Lymph: No easy bruising.  CV: No chest pain.  GU: No hematuria.  Integumentary: No rashes.  Neuro: No headaches.  Psych: No depression/anxiety.  Endocrine: No heat/cold intolerance.  Allergic/Immunologic: No urticaria.  Resp: No cough, SOB.  Musculoskeletal: No joint swelling.    Physical Examination: BP 73/54 mmHg  Pulse 78  Temp(Src) 97.5 F (36.4 C) (Oral)  Resp 14  Ht 6\' 2"  (1.88 m)  Wt 104.5 kg (230 lb 6.1 oz)  BMI 29.57 kg/m2  SpO2 95% Gen: NAD, alert and oriented x 4 HEENT: PEERLA, EOMI, Neck: supple, no JVD or thyromegaly Chest: CTA bilaterally, no wheezes,  crackles, or other adventitious sounds CV: RRR, no m/g/c/r Abd: soft, NT, ND, +BS in all four quadrants; no HSM, guarding, ridigity, or rebound tenderness Ext: no edema, well perfused with 2+ pulses, Skin: no rash or lesions noted Lymph: no LAD Rectal exam: no external hemorrhoids noted.  Data: Lab Results  Component Value Date   WBC 10.2 11/27/2014   HGB 12.6* 11/27/2014   HCT 38.1* 11/27/2014   MCV 83.2 11/27/2014   PLT 128* 11/27/2014    Recent Labs Lab 11/26/14 2043 11/27/14 0603  HGB 14.0 12.6*   Lab Results  Component Value Date   NA 135 11/27/2014   K 5.2* 11/27/2014   CL 96* 11/27/2014   CO2 21* 11/27/2014   BUN  47* 11/27/2014   CREATININE 9.57* 11/27/2014   CREATININE 9.31* 11/27/2014   Lab Results  Component Value Date   ALT 23 11/26/2014   AST 25 11/26/2014   ALKPHOS 83 11/26/2014   BILITOT 0.4 11/26/2014   No results for input(s): APTT, INR, PTT in the last 168 hours.   Imaging:  CLINICAL DATA: Rectal pain. Hypotension.  EXAM: CT PELVIS WITH CONTRAST  TECHNIQUE: Multidetector CT imaging of the pelvis was performed using the standard protocol following the bolus administration of intravenous contrast.  CONTRAST: 112mL OMNIPAQUE IOHEXOL 300 MG/ML SOLN intravenous, without contrast reaction. Patient was premedicated with the 1 hour emergent prep protocol.  COMPARISON: None.  FINDINGS: There are unremarkable appearances of the rectum, perirectal space and perineum. There is no abscess. There is no inflammatory change. No mass is evident. Remainder of the visible portions of the colon are also unremarkable.  There is no pelvic adenopathy. There is no abnormal fluid collection. Prostate and seminal vesicles appear grossly unremarkable. Visible portions of the small bowel are normal in appearance. Urinary bladder is empty and grossly unremarkable in appearance.  IMPRESSION: Negative for significant  abnormality.   Electronically Signed  By: Andreas Newport M.D.  On: 11/27/2014 01:37 Assessment/Plan: Mr. Ghan is a 39 y.o. male with rectal pain  Recommendations: We agree with pain management.  We recommend patient be placed on MiraLax as well as stool softeners to avoid constipation and further aggravation of rectal pain since he is being given Percocet. Please see Dr. Marton Redwood note for further recommendations.  We will continue to follow with you. Thank you for the consult. Please call with questions or concerns.  Salvadore Farber, PA-C  I personally performed the services above.  Subjective: Patient seen for rectal pain.   Pain began about 2 weeks ago, worse when actuating rectal muscles as in trying to stop a bowel movement.  Some small amount of blood noted with bm.  Flex- Sig in 01/2014 noted as normal, done for diarrhea.   Objective: Vital signs in last 24 hours: Temp:  [97.4 F (36.3 C)-97.7 F (36.5 C)] 97.7 F (36.5 C) (08/05 0500) Pulse Rate:  [78-100] 78 (08/05 1800) Resp:  [11-22] 22 (08/05 1800) BP: (69-95)/(36-64) 92/64 mmHg (08/05 1800) SpO2:  [92 %-100 %] 100 % (08/05 1800) Weight:  [102 kg (224 lb 13.9 oz)-104.5 kg (230 lb 6.1 oz)] 104.5 kg (230 lb 6.1 oz) (08/05 0400) Blood pressure 92/64, pulse 78, temperature 97.7 F (36.5 C), temperature source Oral, resp. rate 22, height 6\' 2"  (1.88 m), weight 104.5 kg (230 lb 6.1 oz), SpO2 100 %.   Intake/Output from previous day: 08/04 0701 - 08/05 0700 In: 228.3 [I.V.:228.3] Out: -   Intake/Output this shift:     General appearance: well appearing, male NAD Resp:  bcta Cardio:  rrr GI:  Soft, non-tender, nondistended, bs positive. Normoactive.  Extremities:  No CCE  DRE probable posterior midline fissure, very tender to palpation  With moderate local edema in the anal canal. No gross blood on the examining finger.    Lab Results: Results for orders placed or performed during the hospital  encounter of 11/26/14 (from the past 24 hour(s))  Comprehensive metabolic panel     Status: Abnormal   Collection Time: 11/26/14  8:43 PM  Result Value Ref Range   Sodium 137 135 - 145 mmol/L   Potassium 4.1 3.5 - 5.1 mmol/L   Chloride 92 (L) 101 - 111 mmol/L   CO2 28 22 -  32 mmol/L   Glucose, Bld 104 (H) 65 - 99 mg/dL   BUN 38 (H) 6 - 20 mg/dL   Creatinine, Ser 8.50 (H) 0.61 - 1.24 mg/dL   Calcium 10.2 8.9 - 10.3 mg/dL   Total Protein 8.5 (H) 6.5 - 8.1 g/dL   Albumin 4.2 3.5 - 5.0 g/dL   AST 25 15 - 41 U/L   ALT 23 17 - 63 U/L   Alkaline Phosphatase 83 38 - 126 U/L   Total Bilirubin 0.4 0.3 - 1.2 mg/dL   GFR calc non Af Amer 7 (L) >60 mL/min   GFR calc Af Amer 8 (L) >60 mL/min   Anion gap 17 (H) 5 - 15  CBC     Status: Abnormal   Collection Time: 11/26/14  8:43 PM  Result Value Ref Range   WBC 11.1 (H) 3.8 - 10.6 K/uL   RBC 5.26 4.40 - 5.90 MIL/uL   Hemoglobin 14.0 13.0 - 18.0 g/dL   HCT 42.6 40.0 - 52.0 %   MCV 81.0 80.0 - 100.0 fL   MCH 26.6 26.0 - 34.0 pg   MCHC 32.8 32.0 - 36.0 g/dL   RDW 21.1 (H) 11.5 - 14.5 %   Platelets 173 150 - 440 K/uL  Differential     Status: Abnormal   Collection Time: 11/26/14  8:43 PM  Result Value Ref Range   Neutrophils Relative % 65 %   Neutro Abs 7.1 (H) 1.4 - 6.5 K/uL   Lymphocytes Relative 20 %   Lymphs Abs 2.2 1.0 - 3.6 K/uL   Monocytes Relative 12 %   Monocytes Absolute 1.3 (H) 0.2 - 1.0 K/uL   Eosinophils Relative 2 %   Eosinophils Absolute 0.3 0 - 0.7 K/uL   Basophils Relative 1 %   Basophils Absolute 0.1 0 - 0.1 K/uL  Type and screen     Status: None   Collection Time: 11/26/14  8:49 PM  Result Value Ref Range   ABO/RH(D) O POS    Antibody Screen NEG    Sample Expiration 11/29/2014   Lactic acid, plasma     Status: None   Collection Time: 11/26/14  9:37 PM  Result Value Ref Range   Lactic Acid, Venous 1.7 0.5 - 2.0 mmol/L  Lactic acid, plasma     Status: Abnormal   Collection Time: 11/27/14  1:43 AM  Result Value  Ref Range   Lactic Acid, Venous 2.3 (HH) 0.5 - 2.0 mmol/L  MRSA PCR Screening     Status: None   Collection Time: 11/27/14  3:57 AM  Result Value Ref Range   MRSA by PCR NEGATIVE NEGATIVE  Troponin I     Status: None   Collection Time: 11/27/14  6:03 AM  Result Value Ref Range   Troponin I <0.03 <0.031 ng/mL  Creatinine, serum     Status: Abnormal   Collection Time: 11/27/14  6:03 AM  Result Value Ref Range   Creatinine, Ser 9.57 (H) 0.61 - 1.24 mg/dL   GFR calc non Af Amer 6 (L) >60 mL/min   GFR calc Af Amer 7 (L) >60 mL/min  Basic metabolic panel     Status: Abnormal   Collection Time: 11/27/14  6:03 AM  Result Value Ref Range   Sodium 135 135 - 145 mmol/L   Potassium 5.2 (H) 3.5 - 5.1 mmol/L   Chloride 96 (L) 101 - 111 mmol/L   CO2 21 (L) 22 - 32 mmol/L   Glucose, Bld 174 (H)  65 - 99 mg/dL   BUN 47 (H) 6 - 20 mg/dL   Creatinine, Ser 9.31 (H) 0.61 - 1.24 mg/dL   Calcium 9.0 8.9 - 10.3 mg/dL   GFR calc non Af Amer 6 (L) >60 mL/min   GFR calc Af Amer 7 (L) >60 mL/min   Anion gap 18 (H) 5 - 15  CBC     Status: Abnormal   Collection Time: 11/27/14  6:03 AM  Result Value Ref Range   WBC 10.2 3.8 - 10.6 K/uL   RBC 4.58 4.40 - 5.90 MIL/uL   Hemoglobin 12.6 (L) 13.0 - 18.0 g/dL   HCT 38.1 (L) 40.0 - 52.0 %   MCV 83.2 80.0 - 100.0 fL   MCH 27.4 26.0 - 34.0 pg   MCHC 33.0 32.0 - 36.0 g/dL   RDW 20.6 (H) 11.5 - 14.5 %   Platelets 128 (L) 150 - 440 K/uL      Recent Labs  11/26/14 2043 11/27/14 0603  WBC 11.1* 10.2  HGB 14.0 12.6*  HCT 42.6 38.1*  PLT 173 128*   BMET  Recent Labs  11/26/14 2043 11/27/14 0603  NA 137 135  K 4.1 5.2*  CL 92* 96*  CO2 28 21*  GLUCOSE 104* 174*  BUN 38* 47*  CREATININE 8.50* 9.31*  9.57*  CALCIUM 10.2 9.0   LFT  Recent Labs  11/26/14 2043  PROT 8.5*  ALBUMIN 4.2  AST 25  ALT 23  ALKPHOS 83  BILITOT 0.4   PT/INR No results for input(s): LABPROT, INR in the last 72 hours. Hepatitis Panel No results for input(s):  HEPBSAG, HCVAB, HEPAIGM, HEPBIGM in the last 72 hours. C-Diff No results for input(s): CDIFFTOX in the last 72 hours. No results for input(s): CDIFFPCR in the last 72 hours.   Studies/Results: Ct Pelvis W Contrast  11/27/2014   CLINICAL DATA:  Rectal pain.  Hypotension.  EXAM: CT PELVIS WITH CONTRAST  TECHNIQUE: Multidetector CT imaging of the pelvis was performed using the standard protocol following the bolus administration of intravenous contrast.  CONTRAST:  151mL OMNIPAQUE IOHEXOL 300 MG/ML SOLN intravenous, without contrast reaction. Patient was premedicated with the 1 hour emergent prep protocol.  COMPARISON:  None.  FINDINGS: There are unremarkable appearances of the rectum, perirectal space and perineum. There is no abscess. There is no inflammatory change. No mass is evident. Remainder of the visible portions of the colon are also unremarkable.  There is no pelvic adenopathy. There is no abnormal fluid collection. Prostate and seminal vesicles appear grossly unremarkable. Visible portions of the small bowel are normal in appearance. Urinary bladder is empty and grossly unremarkable in appearance.  IMPRESSION: Negative for significant abnormality.   Electronically Signed   By: Andreas Newport M.D.   On: 11/27/2014 01:37   Dg Chest Portable 1 View  11/26/2014   CLINICAL DATA:  Weakness and rectal pain for 1 week.  EXAM: PORTABLE CHEST - 1 VIEW  COMPARISON:  Single view of the chest 09/21/2014 and 06/06/2013.  FINDINGS: The lungs are clear. Heart size is normal. No pneumothorax or pleural effusion.  IMPRESSION: No acute disease.   Electronically Signed   By: Inge Rise M.D.   On: 11/26/2014 21:33    Scheduled Inpatient Medications:   . calcium acetate  2,001 mg Oral TID WC  . calcium-vitamin D  1 tablet Oral BID WC  . heparin  5,000 Units Subcutaneous 3 times per day  . midodrine  5 mg Oral TID WC  Continuous Inpatient Infusions:     PRN Inpatient Medications:   oxyCODONE-acetaminophen  Miscellaneous: Assessment:  1) spasm of distal rectum/anal canal. Possibly related to anal fissure.    Plan:  1) continue 2.5% rectal cortisone cream tid for 10 days, applied to the anal canal.  If there is no improvement, may need repeat flex sig.   Lollie Sails MD 11/27/2014, 7:34 PM

## 2014-11-27 NOTE — Progress Notes (Signed)
[facility]  ROUNDING NOTE   Subjective:   Admitted overnight for rectal pain. Given topical lidocaine, hydrocortisone and oral percocet. He then had a bowel movement and felt better. He states his pain is better controlled.  In the ICU due to hypotension. Only taking metoprolol for blood pressure at home. No longer on lisinopril.   Objective:  Vital signs in last 24 hours:  Temp:  [97.4 F (36.3 C)-97.5 F (36.4 C)] 97.5 F (36.4 C) (08/05 0400) Pulse Rate:  [78-100] 78 (08/05 0800) Resp:  [11-20] 14 (08/05 0800) BP: (73-95)/(36-64) 73/54 mmHg (08/05 0800) SpO2:  [92 %-100 %] 95 % (08/05 0800) Weight:  [102 kg (224 lb 13.9 oz)-104.5 kg (230 lb 6.1 oz)] 104.5 kg (230 lb 6.1 oz) (08/05 0400)  Weight change:  Filed Weights   11/26/14 2033 11/27/14 0400  Weight: 102 kg (224 lb 13.9 oz) 104.5 kg (230 lb 6.1 oz)    Intake/Output: I/O last 3 completed shifts: In: 228.3 [I.V.:228.3] Out: -    Intake/Output this shift:     Physical Exam: General: NAD  Head: Normocephalic, atraumatic. Moist oral mucosal membranes  Eyes: Anicteric, PERRL  Neck: Supple, trachea midline  Lungs:  Clear to auscultation  Heart: Regular rate and rhythm  Abdomen:  Soft, nontender,   Extremities: No peripheral edema.  Neurologic: Nonfocal, moving all four extremities  Skin: No lesions  Access: Right arm AVG with fistular component    Basic Metabolic Panel:  Recent Labs Lab 11/26/14 2043 11/27/14 0603  NA 137  --   K 4.1  --   CL 92*  --   CO2 28  --   GLUCOSE 104*  --   BUN 38*  --   CREATININE 8.50* 9.57*  CALCIUM 10.2  --     Liver Function Tests:  Recent Labs Lab 11/26/14 2043  AST 25  ALT 23  ALKPHOS 83  BILITOT 0.4  PROT 8.5*  ALBUMIN 4.2   No results for input(s): LIPASE, AMYLASE in the last 168 hours. No results for input(s): AMMONIA in the last 168 hours.  CBC:  Recent Labs Lab 11/26/14 2043 11/27/14 0603  WBC 11.1* 10.2  NEUTROABS 7.1*  --    HGB 14.0 12.6*  HCT 42.6 38.1*  MCV 81.0 83.2  PLT 173 128*    Cardiac Enzymes: No results for input(s): CKTOTAL, CKMB, CKMBINDEX, TROPONINI in the last 168 hours.  BNP: Invalid input(s): POCBNP  CBG: No results for input(s): GLUCAP in the last 168 hours.  Microbiology: Results for orders placed or performed during the hospital encounter of 11/26/14  MRSA PCR Screening     Status: None   Collection Time: 11/27/14  3:57 AM  Result Value Ref Range Status   MRSA by PCR NEGATIVE NEGATIVE Final    Comment:        The GeneXpert MRSA Assay (FDA approved for NASAL specimens only), is one component of a comprehensive MRSA colonization surveillance program. It is not intended to diagnose MRSA infection nor to guide or monitor treatment for MRSA infections.     Coagulation Studies: No results for input(s): LABPROT, INR in the last 72 hours.  Urinalysis: No results for input(s): COLORURINE, LABSPEC, PHURINE, GLUCOSEU, HGBUR, BILIRUBINUR, KETONESUR, PROTEINUR, UROBILINOGEN, NITRITE, LEUKOCYTESUR in the last 72 hours.  Invalid input(s): APPERANCEUR    Imaging: Ct Pelvis W Contrast  11/27/2014   CLINICAL DATA:  Rectal pain.  Hypotension.  EXAM: CT PELVIS WITH CONTRAST  TECHNIQUE: Multidetector CT imaging of the  pelvis was performed using the standard protocol following the bolus administration of intravenous contrast.  CONTRAST:  147mL OMNIPAQUE IOHEXOL 300 MG/ML SOLN intravenous, without contrast reaction. Patient was premedicated with the 1 hour emergent prep protocol.  COMPARISON:  None.  FINDINGS: There are unremarkable appearances of the rectum, perirectal space and perineum. There is no abscess. There is no inflammatory change. No mass is evident. Remainder of the visible portions of the colon are also unremarkable.  There is no pelvic adenopathy. There is no abnormal fluid collection. Prostate and seminal vesicles appear grossly unremarkable. Visible portions of the small bowel  are normal in appearance. Urinary bladder is empty and grossly unremarkable in appearance.  IMPRESSION: Negative for significant abnormality.   Electronically Signed   By: Andreas Newport M.D.   On: 11/27/2014 01:37   Dg Chest Portable 1 View  11/26/2014   CLINICAL DATA:  Weakness and rectal pain for 1 week.  EXAM: PORTABLE CHEST - 1 VIEW  COMPARISON:  Single view of the chest 09/21/2014 and 06/06/2013.  FINDINGS: The lungs are clear. Heart size is normal. No pneumothorax or pleural effusion.  IMPRESSION: No acute disease.   Electronically Signed   By: Inge Rise M.D.   On: 11/26/2014 21:33     Medications:     . calcium acetate  2,001 mg Oral TID WC  . calcium-vitamin D  1 tablet Oral BID WC  . heparin  5,000 Units Subcutaneous 3 times per day   oxyCODONE-acetaminophen  Assessment/ Plan:  [name] is a 39 y.o. black  male with End Stage Renal Disease with right arm AVF/AVG followed by [facility] [address], hypertension, hyperlipidemia, Guillain-Barre syndrome  [facility] [address] [facility]TS  1. ESRD: MWF HD: N18.6 [place]. [facility]. - Patient received dialysis treatment yesterday. States this went will without issue.  Next treatment for Saturday.   2. Hypotension: unclear etiology. History of very difficult to control blood pressure.  - check echocardiogram - start midodrine 5mg  tid - holding all home blood pressure medications: home regimen of metoprolol and lisinopril (patient was not taking lisinopril)  3. Anemia of Chronic Kidney Disease: D63.1 Hemoglobin 12.6 - no indication for epo - gets micera as outpatient.   4. Secondary Hyperparathyroidism:N25.81 - Continue calcium acetate  5. Rectal Pain: will consult GI.     LOS: 0 Patrick Downs 8/5/20168:58 AM

## 2014-11-27 NOTE — Progress Notes (Signed)
Dr Guadalupe Maple about pt needing s ool softne and also ordering  meds for bottom Tamsen Snider, RN

## 2014-11-27 NOTE — ED Provider Notes (Signed)
-----------------------------------------   1:44 AM on 11/27/2014 -----------------------------------------  CT abd pel IMPRESSION: Negative for significant abnormality.  Patients blood pressure still low. Will plan on admission.  Nance Pear, MD 11/27/14 [phone]

## 2014-11-27 NOTE — Progress Notes (Signed)
*  PRELIMINARY RESULTS* Echocardiogram 2D Echocardiogram has been performed.  Patrick Downs 11/27/2014, 12:52 PM

## 2014-11-28 LAB — CBC
HCT: 32.1 % — ABNORMAL LOW (ref 40.0–52.0)
HEMOGLOBIN: 10.8 g/dL — AB (ref 13.0–18.0)
MCH: 27.3 pg (ref 26.0–34.0)
MCHC: 33.6 g/dL (ref 32.0–36.0)
MCV: 81.4 fL (ref 80.0–100.0)
PLATELETS: 126 10*3/uL — AB (ref 150–440)
RBC: 3.95 MIL/uL — ABNORMAL LOW (ref 4.40–5.90)
RDW: 20.9 % — ABNORMAL HIGH (ref 11.5–14.5)
WBC: 11 10*3/uL — AB (ref 3.8–10.6)

## 2014-11-28 LAB — BASIC METABOLIC PANEL
Anion gap: 16 — ABNORMAL HIGH (ref 5–15)
BUN: 92 mg/dL — ABNORMAL HIGH (ref 6–20)
CALCIUM: 8.7 mg/dL — AB (ref 8.9–10.3)
CO2: 24 mmol/L (ref 22–32)
CREATININE: 11.76 mg/dL — AB (ref 0.61–1.24)
Chloride: 96 mmol/L — ABNORMAL LOW (ref 101–111)
GFR calc Af Amer: 6 mL/min — ABNORMAL LOW (ref >60)
GFR, EST NON AFRICAN AMERICAN: 5 mL/min — AB (ref >60)
Glucose, Bld: 134 mg/dL — ABNORMAL HIGH (ref 65–99)
Potassium: 5.2 mmol/L — ABNORMAL HIGH (ref 3.5–5.1)
Sodium: 136 mmol/L (ref 135–145)

## 2014-11-28 LAB — HEPATITIS B SURFACE ANTIGEN: Hepatitis B Surface Ag: NEGATIVE

## 2014-11-28 MED ORDER — POLYETHYLENE GLYCOL 3350 17 G PO PACK: 17.0000 g | PACK | Freq: Every day | ORAL | Status: DC

## 2014-11-28 MED ORDER — HYDROCORTISONE 2.5 % RE CREA: TOPICAL_CREAM | Freq: Three times a day (TID) | RECTAL | Status: DC

## 2014-11-28 MED ORDER — MIDODRINE HCL 5 MG PO TABS: 5.0000 mg | ORAL_TABLET | Freq: Three times a day (TID) | ORAL | Status: DC

## 2014-11-28 NOTE — Progress Notes (Signed)
Pt discharged to care of wife.  Pt smiling, thankful for care.  Discharge instructions given to patient, verbalized understanding, denies questions.  Pt ambulatory with steady gait out of department, refused wheelchair.

## 2014-11-28 NOTE — Progress Notes (Signed)
Pt off floor to dialysis

## 2014-11-28 NOTE — Progress Notes (Signed)
[facility]  ROUNDING NOTE   Subjective:   Overall, patient feels better today.  No nausea or vomiting reported.  No shortness of breath.  Due for dialysis today.   Objective:  Vital signs in last 24 hours:  Temp:  [97.6 F (36.4 C)-98.3 F (36.8 C)] 97.6 F (36.4 C) (08/06 0600) Pulse Rate:  [69-114] 69 (08/06 0900) Resp:  [9-25] 9 (08/06 0900) BP: (52-104)/(35-79) 84/64 mmHg (08/06 0900) SpO2:  [83 %-100 %] 94 % (08/06 0900)  Weight change:  Filed Weights   11/26/14 2033 11/27/14 0400  Weight: 102 kg (224 lb 13.9 oz) 104.5 kg (230 lb 6.1 oz)    Intake/Output: I/O last 3 completed shifts: In: 228.3 [I.V.:228.3] Out: -    Intake/Output this shift:     Physical Exam: General: NAD  Head: Normocephalic, atraumatic. Moist oral mucosal membranes  Eyes: Anicteric,  Neck: Supple, trachea midline  Lungs:  Normal effort, right basilar crackles  Heart: Regular rate and rhythm  Abdomen:  Soft, nontender,   Extremities: No peripheral edema.  Neurologic: Nonfocal, moving all four extremities  Skin: No lesions  Access: Right arm AVG with fistular component    Basic Metabolic Panel:  Recent Labs Lab 11/26/14 2043 11/27/14 0603 11/28/14 0623  NA 137 135 136  K 4.1 5.2* 5.2*  CL 92* 96* 96*  CO2 28 21* 24  GLUCOSE 104* 174* 134*  BUN 38* 47* 92*  CREATININE 8.50* 9.31*  9.57* 11.76*  CALCIUM 10.2 9.0 8.7*    Liver Function Tests:  Recent Labs Lab 11/26/14 2043  AST 25  ALT 23  ALKPHOS 83  BILITOT 0.4  PROT 8.5*  ALBUMIN 4.2   No results for input(s): LIPASE, AMYLASE in the last 168 hours. No results for input(s): AMMONIA in the last 168 hours.  CBC:  Recent Labs Lab 11/26/14 2043 11/27/14 0603 11/28/14 0623  WBC 11.1* 10.2 11.0*  NEUTROABS 7.1*  --   --   HGB 14.0 12.6* 10.8*  HCT 42.6 38.1* 32.1*  MCV 81.0 83.2 81.4  PLT 173 128* 126*    Cardiac Enzymes:  Recent Labs Lab 11/27/14 0603  TROPONINI <0.03    BNP: Invalid  input(s): POCBNP  CBG: No results for input(s): GLUCAP in the last 168 hours.  Microbiology: Results for orders placed or performed during the hospital encounter of 11/26/14  MRSA PCR Screening     Status: None   Collection Time: 11/27/14  3:57 AM  Result Value Ref Range Status   MRSA by PCR NEGATIVE NEGATIVE Final    Comment:        The GeneXpert MRSA Assay (FDA approved for NASAL specimens only), is one component of a comprehensive MRSA colonization surveillance program. It is not intended to diagnose MRSA infection nor to guide or monitor treatment for MRSA infections.     Coagulation Studies: No results for input(s): LABPROT, INR in the last 72 hours.  Urinalysis: No results for input(s): COLORURINE, LABSPEC, PHURINE, GLUCOSEU, HGBUR, BILIRUBINUR, KETONESUR, PROTEINUR, UROBILINOGEN, NITRITE, LEUKOCYTESUR in the last 72 hours.  Invalid input(s): APPERANCEUR    Imaging: Ct Pelvis W Contrast  11/27/2014   CLINICAL DATA:  Rectal pain.  Hypotension.  EXAM: CT PELVIS WITH CONTRAST  TECHNIQUE: Multidetector CT imaging of the pelvis was performed using the standard protocol following the bolus administration of intravenous contrast.  CONTRAST:  152mL OMNIPAQUE IOHEXOL 300 MG/ML SOLN intravenous, without contrast reaction. Patient was premedicated with the 1 hour emergent prep protocol.  COMPARISON:  None.  FINDINGS: There are unremarkable appearances of the rectum, perirectal space and perineum. There is no abscess. There is no inflammatory change. No mass is evident. Remainder of the visible portions of the colon are also unremarkable.  There is no pelvic adenopathy. There is no abnormal fluid collection. Prostate and seminal vesicles appear grossly unremarkable. Visible portions of the small bowel are normal in appearance. Urinary bladder is empty and grossly unremarkable in appearance.  IMPRESSION: Negative for significant abnormality.   Electronically Signed   By: Andreas Newport  M.D.   On: 11/27/2014 01:37   Dg Chest Portable 1 View  11/26/2014   CLINICAL DATA:  Weakness and rectal pain for 1 week.  EXAM: PORTABLE CHEST - 1 VIEW  COMPARISON:  Single view of the chest 09/21/2014 and 06/06/2013.  FINDINGS: The lungs are clear. Heart size is normal. No pneumothorax or pleural effusion.  IMPRESSION: No acute disease.   Electronically Signed   By: Inge Rise M.D.   On: 11/26/2014 21:33     Medications:     . calcium acetate  2,001 mg Oral TID WC  . calcium-vitamin D  1 tablet Oral BID WC  . heparin  5,000 Units Subcutaneous 3 times per day  . hydrocortisone   Rectal TID  . midodrine  5 mg Oral TID WC  . polyethylene glycol  17 g Oral Daily   oxyCODONE-acetaminophen  Assessment/ Plan:  [name] is a 39 y.o. black  male with End Stage Renal Disease with right arm AVF/AVG followed by [facility] [address], hypertension, hyperlipidemia, Guillain-Barre syndrome  [facility] [address] TTS  1. ESRD: MWF HD: N18.6 [place]. [facility]. - dialysis planned for today - UF goal 2 L   2. Hypotension: unclear etiology. History of very difficult to control blood pressure.  - check echocardiogram - start midodrine 5mg  tid - holding all home blood pressure medications: home regimen of metoprolol and lisinopril (patient was not taking lisinopril)  3. Anemia of Chronic Kidney Disease: D63.1 Hemoglobin 10.8 - gets micera as outpatient.   4. Secondary Hyperparathyroidism:N25.81 - Continue calcium acetate    LOS: 1 Jaretssi Kraker 8/6/201610:20 AM

## 2014-11-28 NOTE — Progress Notes (Signed)
Hemodialysis treatment completed.

## 2014-11-28 NOTE — Progress Notes (Signed)
Hemodialysis tx start 

## 2014-11-28 NOTE — Discharge Summary (Signed)
[place] [facility] NAME: Patrick Downs    MR#:  [id number]  DATE OF BIRTH:  1975-12-05  DATE OF ADMISSION:  11/26/2014 ADMITTING PHYSICIAN: Lance Coon, MD  DATE OF DISCHARGE: No discharge date for patient encounter.  PRIMARY CARE PHYSICIAN: Adrian Prows, MD     ADMISSION DIAGNOSIS:  Other specified hypotension [I95.89]  DISCHARGE DIAGNOSIS:  Principal Problem:   Hypotension Active Problems:   Rectal pain   ESRD on dialysis   Guillain Barr syndrome, lower ext weakness   SECONDARY DIAGNOSIS:   Past Medical History  Diagnosis Date  . ESRD (end stage renal disease) on dialysis   . Pneumonia   . Respiratory failure   . Failure to thrive in adult   . Guillain Barr syndrome   . Dialysis patient   . Renal insufficiency     .pro HOSPITAL COURSE:   Patient is 39 year old male with history of end-stage renal disease who presents to the hospital with complaints of rectal pain. He was noted to have hypotension and admitted to the hospital for further evaluation. Upon further questioning, it appeared that patient has had problems with blood pressure for a while, including hypotension for the past 4 years.  His labs on admission were unremarkable. He had no white blood cell count elevation. CT scan of pelvis was unremarkable as well as chest x-ray. The patient had echocardiogram while in the hospital which was was remarkable for normal ejection fraction and mild mitral regurgitation, also grade 1 diastolic dysfunction. Patient remained asymptomatic while in the hospital exhibited no fevers. He was initiated on midodrine and his blood pressure improved, allowing hemodialysis via right upper extremity AV fistula, AV graft. He was considered to be stable to be discharged home today on 11/28/2014. He was noted to be hypoxic during the sleep and oxygen saturation will be checked on exertion if he remains hypoxic on exertion, patient  will undergo VQ scanning or CT scan of chest with contrast to rule out pulmonary embolism Discussion by problem Hypotension -  the patient is asymptomatic ,  reporting low blood pressure as outpatient with systolic blood pressure running around 90s for the past several weeks and even years as upon further discussion, patient has been having hypotension episodes since 2012. We will be following patient's blood pressure readings and the sending him to CT angiogram of his chest to rule out pulmonary embolism if he is hypoxic on exertion. Patient is to continue midodrine. 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 Gustavo Lah, though had rectal bleeding in the past and Dr. Gustavo Lah recommends to repeat flexible sigmoidoscopy as outpatient if needed.CT abdomen and pelvis was largely unremarkable for any pathology explaining his pain. Continue hydrocortisone rectal cream, no need for pain medications   ESRD on dialysis - appreciate nephrology recommendations, that this was hemodialysis today on 11/28/2014 . Continue home medications, and avoid nephrotoxins.    Guillain Barr syndrome, lower ext weakness - continue lower extremity orthotics Hypoxia. According to nursing staff, it just during sleep, suggesting likely underlying obstructive sleep apnea. However, cannot rule out other conditions as well. We will be checking patient's pulse oximetry on exertion on room air and sending him to CT angiogram of chest if needed.  DISCHARGE CONDITIONS:   Stable  CONSULTS OBTAINED:  Treatment Team:  Lavonia Dana, MD Lollie Sails, MD Dorene Sorrow  Ether Griffins, MD  DRUG ALLERGIES:   Allergies  Allergen Reactions  . Ivp Dye [Iodinated Diagnostic Agents] Other (See Comments)    Reaction unknown  . Omnipaque [Iohexol] Other (See Comments)    Reaction unknown  . Minoxidil Other (See Comments)     Reaction unknown  . Morphine And Related Other (See Comments)    Reaction unknown    DISCHARGE MEDICATIONS:   Current Discharge Medication List    START taking these medications   Details  hydrocortisone (ANUSOL-HC) 2.5 % rectal cream Place rectally 3 (three) times daily. Qty: 30 g, Refills: 0    midodrine (PROAMATINE) 5 MG tablet Take 1 tablet (5 mg total) by mouth 3 (three) times daily with meals. Qty: 90 tablet, Refills: 2    polyethylene glycol (MIRALAX / GLYCOLAX) packet Take 17 g by mouth daily. Qty: 14 each, Refills: 0      CONTINUE these medications which have NOT CHANGED   Details  calcium acetate (PHOSLO) 667 MG capsule Take 3 capsules (2,001 mg total) by mouth 3 (three) times daily with meals. Qty: 1 capsule, Refills: 0    calcium-vitamin D (OSCAL WITH D) 500-200 MG-UNIT per tablet Take 1 tablet by mouth 2 (two) times daily with a meal. Qty: 1 tablet, Refills: 0    gabapentin (NEURONTIN) 300 MG capsule Take 300 mg by mouth 2 (two) times daily.    multivitamin (RENA-VIT) TABS tablet Take 1 tablet by mouth at bedtime. Qty: 1 tablet, Refills: 0    acetaminophen (TYLENOL) 325 MG tablet Take 2 tablets (650 mg total) by mouth every 4 (four) hours as needed for mild pain or fever (Mild pain (1-3/10) or temp greater than 100.4). Qty: 1 tablet, Refills: 0    antiseptic oral rinse (CPC / CETYLPYRIDINIUM CHLORIDE 0.05%) 0.05 % LIQD solution 7 mLs by Mouth Rinse route QID. Qty: 1 mL, Refills: 0    ibuprofen (ADVIL,MOTRIN) 400 MG tablet Take 1 tablet (400 mg total) by mouth every 6 (six) hours as needed for fever (Give for fever unrelieved by Tylenol). Qty: 30 tablet, Refills: 0         DISCHARGE INSTRUCTIONS:    Patient is to follow-up with his primary care physician, Dr. Ola Spurr and the Dr. Gustavo Lah if needed  If you experience worsening of your admission symptoms, develop shortness of breath, life threatening emergency, suicidal or homicidal thoughts you must  seek medical attention immediately by calling 911 or calling your MD immediately  if symptoms less severe.  You Must read complete instructions/literature along with all the possible adverse reactions/side effects for all the Medicines you take and that have been prescribed to you. Take any new Medicines after you have completely understood and accept all the possible adverse reactions/side effects.   Please note  You were cared for by a hospitalist during your hospital stay. If you have any questions about your discharge medications or the care you received while you were in the hospital after you are discharged, you can call the unit and asked to speak with the hospitalist on call if the hospitalist that took care of you is not available. Once you are discharged, your primary care physician will handle any further medical issues. Please note that NO REFILLS for any discharge medications will be authorized once you are discharged, as it is imperative that you return to your primary care physician (or establish a relationship with a primary care physician if you do not have one) for your aftercare needs so that they  can reassess your need for medications and monitor your lab values.    Today   CHIEF COMPLAINT:   Chief Complaint  Patient presents with  . Rectal Pain  . Hypotension    HISTORY OF PRESENT ILLNESS:  Patrick Downs  is a 39 y.o. male with a known history of end-stage renal disease, history of hypertension who presents to the hospital with complaints of rectal pain and was noted to be hypotensive with systolic blood pressure in 70s to 80s. He was given IV fluids in ER and admitted to the hospital for observation. He was initiated on midodrine for blood pressure problems. His blood pressure improved. He was also seen by gastroenterologist, Dr. Gustavo Lah and started on hydrocortisone rectally and his rectal pain improved. He was felt to be stable to be discharged home today on 11/28/2014  as long as he is not hypoxic on exertion.    VITAL SIGNS:  Blood pressure 111/63, pulse 63, temperature 97.5 F (36.4 C), temperature source Oral, resp. rate 14, height 6\' 2"  (1.88 m), weight 109 kg (240 lb 4.8 oz), SpO2 84 %.  I/O:  No intake or output data in the 24 hours ending 11/28/14 1301  PHYSICAL EXAMINATION:  GENERAL:  39 y.o.-year-old patient lying in the bed with no acute distress. Right upper extremity AV fistula, AV graft EYES: Pupils equal, round, reactive to light and accommodation. No scleral icterus. Extraocular muscles intact.  HEENT: Head atraumatic, normocephalic. Oropharynx and nasopharynx clear.  NECK:  Supple, no jugular venous distention. No thyroid enlargement, no tenderness.  LUNGS: Normal breath sounds bilaterally, no wheezing, rales,rhonchi or crepitation. No use of accessory muscles of respiration.  CARDIOVASCULAR: S1, S2 normal. No murmurs, rubs, or gallops.  ABDOMEN: Soft, non-tender, non-distended. Bowel sounds present. No organomegaly or mass.  EXTREMITIES: No pedal edema, cyanosis, or clubbing.  NEUROLOGIC: Cranial nerves II through XII are intact. Muscle strength 5/5 in all extremities. Sensation intact. Gait not checked.  PSYCHIATRIC: The patient is alert and oriented x 3.  SKIN: No obvious rash, lesion, or ulcer.   DATA REVIEW:   CBC  Recent Labs Lab 11/28/14 0623  WBC 11.0*  HGB 10.8*  HCT 32.1*  PLT 126*    Chemistries   Recent Labs Lab 11/26/14 2043  11/28/14 0623  NA 137  < > 136  K 4.1  < > 5.2*  CL 92*  < > 96*  CO2 28  < > 24  GLUCOSE 104*  < > 134*  BUN 38*  < > 92*  CREATININE 8.50*  < > 11.76*  CALCIUM 10.2  < > 8.7*  AST 25  --   --   ALT 23  --   --   ALKPHOS 83  --   --   BILITOT 0.4  --   --   < > = values in this interval not displayed.  Cardiac Enzymes  Recent Labs Lab 11/27/14 0603  TROPONINI <0.03    Microbiology Results  Results for orders placed or performed during the hospital encounter of  11/26/14  MRSA PCR Screening     Status: None   Collection Time: 11/27/14  3:57 AM  Result Value Ref Range Status   MRSA by PCR NEGATIVE NEGATIVE Final    Comment:        The GeneXpert MRSA Assay (FDA approved for NASAL specimens only), is one component of a comprehensive MRSA colonization surveillance program. It is not intended to diagnose MRSA infection nor to guide or monitor  treatment for MRSA infections.     RADIOLOGY:  Ct Pelvis W Contrast  11/27/2014   CLINICAL DATA:  Rectal pain.  Hypotension.  EXAM: CT PELVIS WITH CONTRAST  TECHNIQUE: Multidetector CT imaging of the pelvis was performed using the standard protocol following the bolus administration of intravenous contrast.  CONTRAST:  131mL OMNIPAQUE IOHEXOL 300 MG/ML SOLN intravenous, without contrast reaction. Patient was premedicated with the 1 hour emergent prep protocol.  COMPARISON:  None.  FINDINGS: There are unremarkable appearances of the rectum, perirectal space and perineum. There is no abscess. There is no inflammatory change. No mass is evident. Remainder of the visible portions of the colon are also unremarkable.  There is no pelvic adenopathy. There is no abnormal fluid collection. Prostate and seminal vesicles appear grossly unremarkable. Visible portions of the small bowel are normal in appearance. Urinary bladder is empty and grossly unremarkable in appearance.  IMPRESSION: Negative for significant abnormality.   Electronically Signed   By: Andreas Newport M.D.   On: 11/27/2014 01:37   Dg Chest Portable 1 View  11/26/2014   CLINICAL DATA:  Weakness and rectal pain for 1 week.  EXAM: PORTABLE CHEST - 1 VIEW  COMPARISON:  Single view of the chest 09/21/2014 and 06/06/2013.  FINDINGS: The lungs are clear. Heart size is normal. No pneumothorax or pleural effusion.  IMPRESSION: No acute disease.   Electronically Signed   By: Inge Rise M.D.   On: 11/26/2014 21:33    EKG:   Orders placed or performed during the  hospital encounter of 11/26/14  . ED EKG  . ED EKG  . EKG 12-Lead  . EKG 12-Lead      Management plans discussed with the patient, family and they are in agreement.  CODE STATUS:     Code Status Orders        Start     Ordered   11/27/14 0342  Full code   Continuous     11/27/14 0341      TOTAL TIME TAKING CARE OF THIS PATIENT: 40 minutes.    Theodoro Grist M.D on 11/28/2014 at 1:01 PM  Between 7am to 6pm - Pager - [phone]  After 6pm go to [email or web address] - password [email or web address] [facility]  [facility]  Office  [phone]  CC: Primary care physician; Adrian Prows, MD

## 2014-12-11 DIAGNOSIS — I950 Idiopathic hypotension: Secondary | ICD-10-CM | POA: Insufficient documentation

## 2015-05-20 DIAGNOSIS — L0889 Other specified local infections of the skin and subcutaneous tissue: Secondary | ICD-10-CM | POA: Insufficient documentation

## 2015-06-04 DIAGNOSIS — Z4659 Encounter for fitting and adjustment of other gastrointestinal appliance and device: Secondary | ICD-10-CM | POA: Insufficient documentation

## 2015-06-29 DIAGNOSIS — A499 Bacterial infection, unspecified: Secondary | ICD-10-CM | POA: Insufficient documentation

## 2015-06-29 DIAGNOSIS — S41101A Unspecified open wound of right upper arm, initial encounter: Secondary | ICD-10-CM | POA: Insufficient documentation

## 2015-08-02 DIAGNOSIS — I509 Heart failure, unspecified: Secondary | ICD-10-CM | POA: Insufficient documentation

## 2015-08-02 DIAGNOSIS — K529 Noninfective gastroenteritis and colitis, unspecified: Secondary | ICD-10-CM | POA: Insufficient documentation

## 2015-08-02 DIAGNOSIS — D638 Anemia in other chronic diseases classified elsewhere: Secondary | ICD-10-CM | POA: Insufficient documentation

## 2015-08-02 DIAGNOSIS — D649 Anemia, unspecified: Secondary | ICD-10-CM | POA: Insufficient documentation

## 2015-08-02 DIAGNOSIS — G629 Polyneuropathy, unspecified: Secondary | ICD-10-CM | POA: Insufficient documentation

## 2015-08-02 DIAGNOSIS — N2581 Secondary hyperparathyroidism of renal origin: Secondary | ICD-10-CM | POA: Insufficient documentation

## 2015-08-02 DIAGNOSIS — N186 End stage renal disease: Secondary | ICD-10-CM | POA: Insufficient documentation

## 2015-08-23 DIAGNOSIS — Z7682 Awaiting organ transplant status: Secondary | ICD-10-CM | POA: Insufficient documentation

## 2016-05-31 DIAGNOSIS — N521 Erectile dysfunction due to diseases classified elsewhere: Secondary | ICD-10-CM | POA: Insufficient documentation

## 2016-06-23 ENCOUNTER — Ambulatory Visit (INDEPENDENT_AMBULATORY_CARE_PROVIDER_SITE_OTHER): Payer: Medicare Other | Admitting: Urology

## 2016-06-23 ENCOUNTER — Encounter: Payer: Self-pay | Admitting: Urology

## 2016-06-23 VITALS — BP 88/48 | HR 97 | Ht 74.0 in | Wt 253.0 lb

## 2016-06-23 DIAGNOSIS — N529 Male erectile dysfunction, unspecified: Secondary | ICD-10-CM | POA: Diagnosis not present

## 2016-06-23 MED ORDER — TADALAFIL 20 MG PO TABS: 20.0000 mg | ORAL_TABLET | Freq: Every day | ORAL | 3 refills | Status: DC | PRN

## 2016-06-23 NOTE — Progress Notes (Signed)
06/23/2016 3:24 PM   Patrick Downs October 02, 1975 [id number]  Referring provider: Leonel Ramsay, MD [address], [place] [address]  Chief Complaint  Patient presents with  . New Patient (Initial Visit)    ED referred by Dr. Ola Spurr    HPI: The patient is a 41 year old gentleman with multiple medical comorbidities including end-stage renal disease on dialysis, guillan barre syndrome presents today to discuss erectile dysfunction. He has tried Viagra which has not helped him. He is sometimes able to achieve an erection however he has never able to complete intercourse. This has been getting worse over the last 2 years. He has no other urological disorders outside of his renal failure.  Of note, the patient is being worked up for a renal transplant. During this workup he has passed both a treadmill and medical stress test. He can walk up 2 flights of stairs without difficulty. He denies any cardiac history.   PMH: Past Medical History:  Diagnosis Date  . Depression   . Dialysis patient ([place])   . ESRD (end stage renal disease) on dialysis ([place])   . Failure to thrive in adult   . Gout   . Guillain Barr syndrome ([place])   . HTN (hypertension)   . Kidney failure   . Pneumonia   . Renal insufficiency   . Respiratory failure [facility])     Surgical History: Past Surgical History:  Procedure Laterality Date  . AV FISTULA PLACEMENT     x5      2 graphs  . PARATHYROIDECTOMY    . RENAL BIOPSY    . tonsiilectomy    . tracheotomy      Home Medications:  Allergies as of 06/23/2016      Reactions   Ivp Dye [iodinated Diagnostic Agents] Other (See Comments)   Reaction unknown   Other    Other reaction(s): Unknown Reaction unknown   Minoxidil Other (See Comments)   "put fluid around my heart" "put fluid around my heart" PERICARDIAL EFFUSION Reaction unknown   Morphine Other (See Comments)   Other reaction(s):  Unknown Reaction unknown Makes patient aggressive   Morphine And Related Other (See Comments)   Reaction unknown   Omnipaque [iohexol] Other (See Comments), Itching   Rigors on one occasion, widespread itching on a separate occasion (resolved with Benadryl). Reaction unknown Reaction unknown tremors Rigors on one occasion, widespread itching on a separate occasion (resolved with Benadryl). Reaction unknown      Medication List       Accurate as of 06/23/16  3:24 PM. Always use your most recent med list.          acetaminophen 325 MG tablet Commonly known as:  TYLENOL Take 2 tablets (650 mg total) by mouth every 4 (four) hours as needed for mild pain or fever (Mild pain (1-3/10) or temp greater than 100.4).   amitriptyline 25 MG tablet Commonly known as:  ELAVIL Take by mouth.   antiseptic oral rinse 0.05 % Liqd solution Commonly known as:  CPC / CETYLPYRIDINIUM CHLORIDE 0.05% 7 mLs by Mouth Rinse route QID.   calcium acetate 667 MG capsule Commonly known as:  PHOSLO Take 3 capsules (2,001 mg total) by mouth 3 (three) times daily with meals.   calcium-vitamin D 500-200 MG-UNIT tablet Commonly known as:  OSCAL WITH D Take 1 tablet by mouth 2 (two) times daily with a meal.   doxycycline 100 MG capsule Commonly known as:  VIBRAMYCIN TK ONE C PO  BID   gabapentin 300 MG capsule Commonly known as:  NEURONTIN Take 300 mg by mouth 2 (two) times daily.   HYDROcodone-acetaminophen 5-325 MG tablet Commonly known as:  NORCO/VICODIN Take by mouth. Start taking on:  07/08/2016   hydrocortisone 2.5 % rectal cream Commonly known as:  ANUSOL-HC Place rectally 3 (three) times daily.   hydrOXYzine 25 MG tablet Commonly known as:  ATARAX/VISTARIL Take 25 mg by mouth.   ibuprofen 400 MG tablet Commonly known as:  ADVIL,MOTRIN Take 1 tablet (400 mg total) by mouth every 6 (six) hours as needed for fever (Give for fever unrelieved by Tylenol).   midodrine 5 MG  tablet Commonly known as:  PROAMATINE Take 1 tablet (5 mg total) by mouth 3 (three) times daily with meals.   multivitamin Tabs tablet Take 1 tablet by mouth at bedtime.   polyethylene glycol packet Commonly known as:  MIRALAX / GLYCOLAX Take 17 g by mouth daily.   predniSONE 50 MG tablet Commonly known as:  DELTASONE Take 1 tabby mouth 13 hours before procedure, one tab by mouth 7 hours before procedure, one tab by mouth one hour before procedure   sildenafil 20 MG tablet Commonly known as:  REVATIO Take by mouth.   tadalafil 20 MG tablet Commonly known as:  CIALIS Take 1 tablet (20 mg total) by mouth daily as needed for erectile dysfunction.       Allergies:  Allergies  Allergen Reactions  . Ivp Dye [Iodinated Diagnostic Agents] Other (See Comments)    Reaction unknown  . Other     Other reaction(s): Unknown Reaction unknown  . Minoxidil Other (See Comments)    "put fluid around my heart" "put fluid around my heart" PERICARDIAL EFFUSION Reaction unknown  . Morphine Other (See Comments)    Other reaction(s): Unknown Reaction unknown Makes patient aggressive  . Morphine And Related Other (See Comments)    Reaction unknown  . Omnipaque [Iohexol] Other (See Comments) and Itching    Rigors on one occasion, widespread itching on a separate occasion (resolved with Benadryl). Reaction unknown Reaction unknown tremors Rigors on one occasion, widespread itching on a separate occasion (resolved with Benadryl). Reaction unknown    Family History: Family History  Problem Relation Age of Onset  . Diabetes Mellitus II Father   . Kidney disease Father   . Kidney failure Paternal Grandfather   . Prostate cancer Neg Hx   . Kidney cancer Neg Hx   . Bladder Cancer Neg Hx     Social History:  reports that he has never smoked. He has never used smokeless tobacco. He reports that he uses drugs, including Marijuana. He reports that he does not drink  alcohol.  ROS: UROLOGY Frequent Urination?: No Hard to postpone urination?: No Burning/pain with urination?: No Get up at night to urinate?: No Leakage of urine?: No Urine stream starts and stops?: No Trouble starting stream?: No Do you have to strain to urinate?: No Blood in urine?: No Urinary tract infection?: No Sexually transmitted disease?: No Injury to kidneys or bladder?: No Painful intercourse?: No Weak stream?: No Erection problems?: Yes Penile pain?: No  Gastrointestinal Nausea?: No Vomiting?: No Indigestion/heartburn?: No Diarrhea?: No Constipation?: No  Constitutional Fever: No Night sweats?: No Weight loss?: No Fatigue?: No  Skin Skin rash/lesions?: No Itching?: No  Eyes Blurred vision?: No Double vision?: No  Ears/Nose/Throat Sore throat?: No Sinus problems?: No  Hematologic/Lymphatic Swollen glands?: No Easy bruising?: No  Cardiovascular  Leg swelling?: No Chest pain?: No  Respiratory Cough?: No Shortness of breath?: No  Endocrine Excessive thirst?: No  Musculoskeletal Back pain?: No Joint pain?: No  Neurological Headaches?: No Dizziness?: No  Psychologic Depression?: No Anxiety?: No  Physical Exam: BP (!) 88/48   Pulse 97   Ht 6\' 2"  (1.88 m)   Wt 253 lb (114.8 kg)   BMI 32.48 kg/m   Constitutional:  Alert and oriented, No acute distress. HEENT: [place] AT, moist mucus membranes.  Trachea midline, no masses. Cardiovascular: No clubbing, cyanosis, or edema. Respiratory: Normal respiratory effort, no increased work of breathing. GI: Abdomen is soft, nontender, nondistended, no abdominal masses GU: No CVA tenderness. Normal phallus. Testicles and equal bilaterally. No masses. Skin: No rashes, bruises or suspicious lesions. Lymph: No cervical or inguinal adenopathy. Neurologic: Grossly intact, no focal deficits, moving all 4 extremities. Psychiatric: Normal mood and affect.  Laboratory Data: Lab Results  Component Value  Date   WBC 11.0 (H) 11/28/2014   HGB 10.8 (L) 11/28/2014   HCT 32.1 (L) 11/28/2014   MCV 81.4 11/28/2014   PLT 126 (L) 11/28/2014    Lab Results  Component Value Date   CREATININE 11.76 (H) 11/28/2014    No results found for: PSA  No results found for: TESTOSTERONE  No results found for: HGBA1C  Urinalysis No results found for: COLORURINE, APPEARANCEUR, LABSPEC, PHURINE, GLUCOSEU, HGBUR, BILIRUBINUR, KETONESUR, PROTEINUR, UROBILINOGEN, NITRITE, LEUKOCYTESUR    Assessment & Plan:    1. Erectile dysfunction I discussed the patient treatment options for his ED including trying another phosphodiesterase inhibitor, vacuum erection device, penile prosthesis, penile injections, and Muse. He has elected to try Cialis 20 mg daily. I did warn him that this will be likely expensive and may not work for him. He would like to start with this. He will follow-up in a few months to assess his progress. He was warned of the risk of priapism and need for emergent intervention.  Return in about 3 months (around 09/23/2016).  Nickie Retort, MD  [facility] [address] [place], [place] [address] [phone]

## 2016-09-08 ENCOUNTER — Other Ambulatory Visit: Payer: Self-pay | Admitting: Orthopedic Surgery

## 2016-09-08 DIAGNOSIS — M25561 Pain in right knee: Secondary | ICD-10-CM

## 2016-09-22 ENCOUNTER — Ambulatory Visit: Admission: RE | Admit: 2016-09-22 | Payer: Medicare Other | Source: Ambulatory Visit

## 2016-09-29 ENCOUNTER — Ambulatory Visit: Payer: Medicare Other | Admitting: Urology

## 2016-10-02 ENCOUNTER — Ambulatory Visit
Admission: RE | Admit: 2016-10-02 | Discharge: 2016-10-02 | Disposition: A | Discharge status: Home / Self Care | Payer: Medicare Other | Source: Ambulatory Visit | Attending: Orthopedic Surgery | Admitting: Orthopedic Surgery

## 2016-10-02 DIAGNOSIS — M25561 Pain in right knee: Secondary | ICD-10-CM | POA: Diagnosis present

## 2016-10-13 IMAGING — CT CT CHEST W/O CM
1 of 2 series · 14 of 31 positions shown, 18 images · non-contrast
Comparison: 08/07/2014, 06/06/2013

CLINICAL DATA: Nonproductive cough for 1 week

EXAM:
CT CHEST WITHOUT CONTRAST
TECHNIQUE: Multidetector CT imaging of the chest was performed following the
standard protocol without IV contrast..

[Series 2: routine chest wo · axial · 0.78mm/px · z∈[-196,+74]mm · 14 of 64 slices shown, 18 images]
[im 5/64  mediastinal]
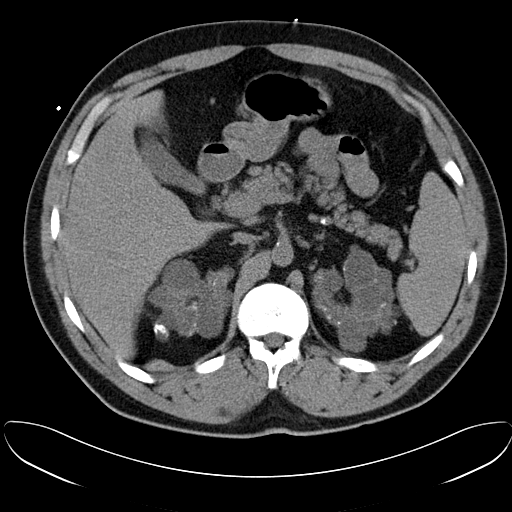
[im 5/64  lung]
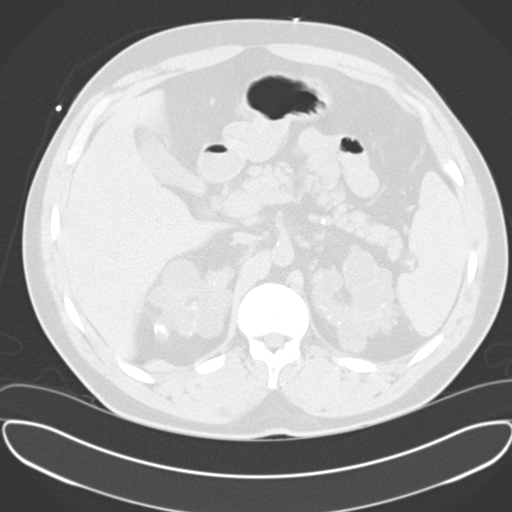
[im 10/64  lung]
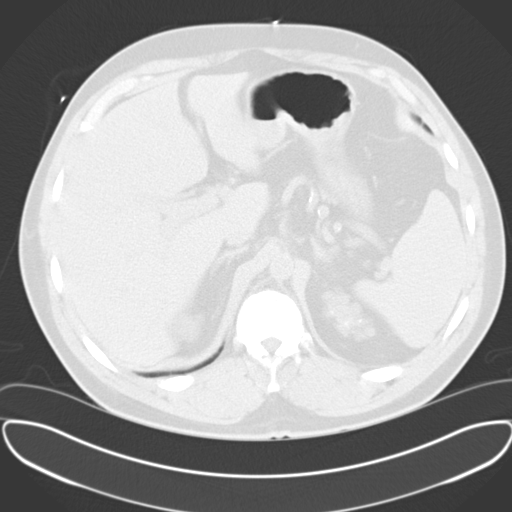
[im 15/64  lung]
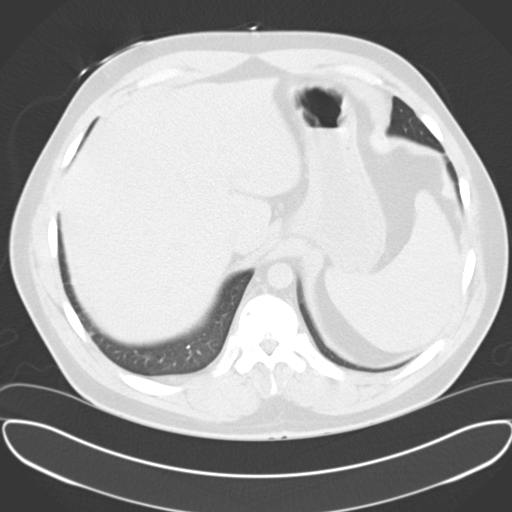
[im 20/64  lung]
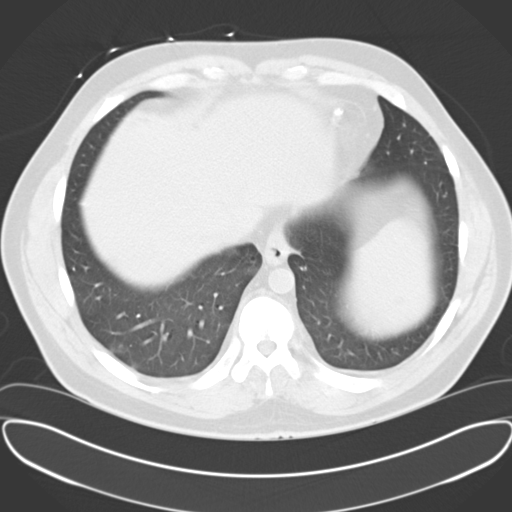
[im 25/64  mediastinal]
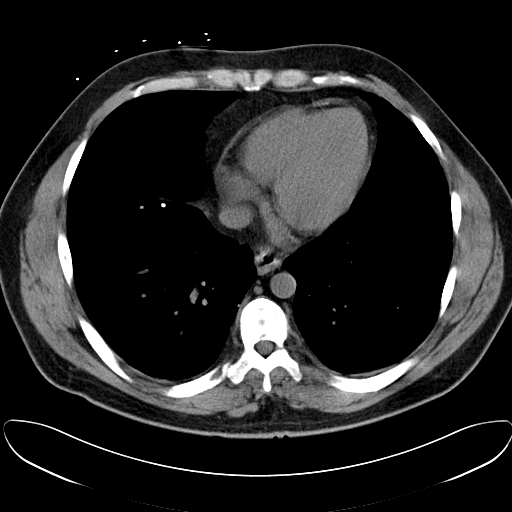
[im 25/64  lung]
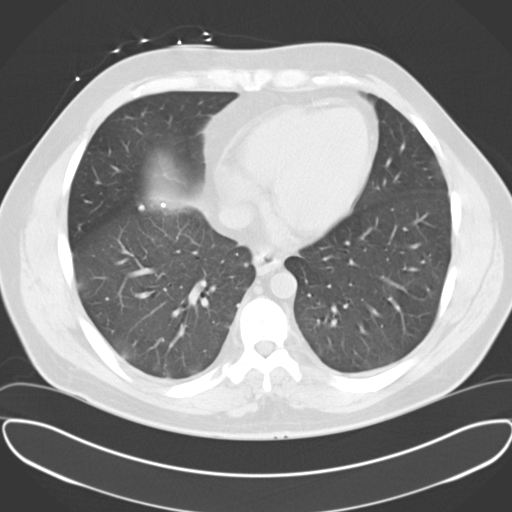
[im 30/64  lung]
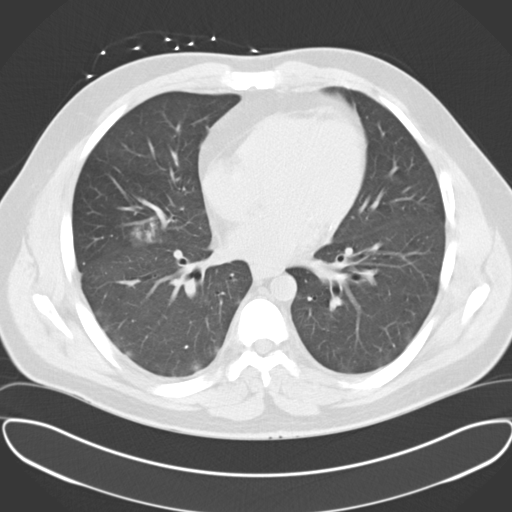
[im 31/64  lung]
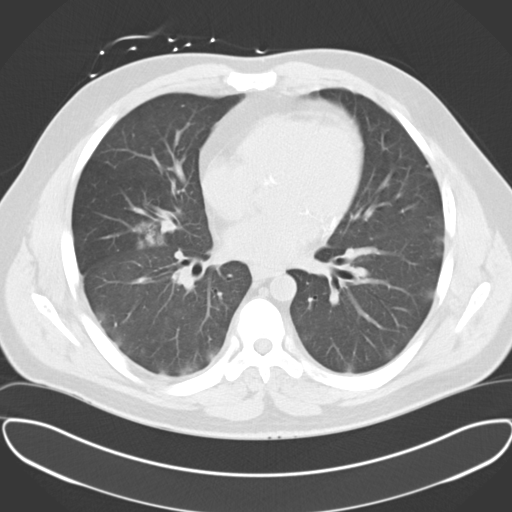
[im 32/64  lung]
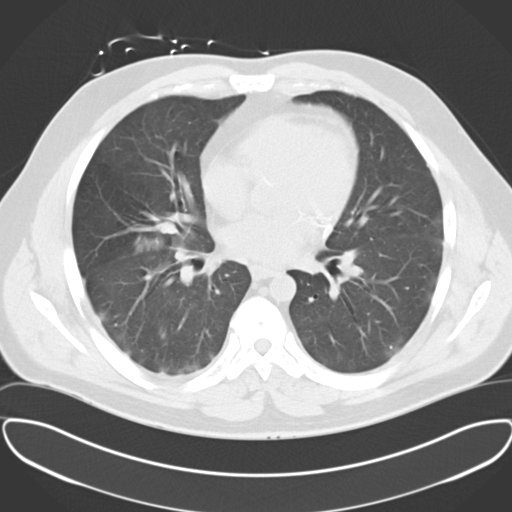
[im 34/64  mediastinal]
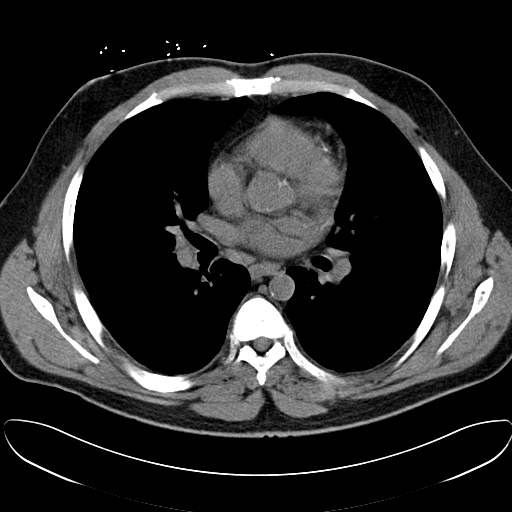
[im 34/64  lung]
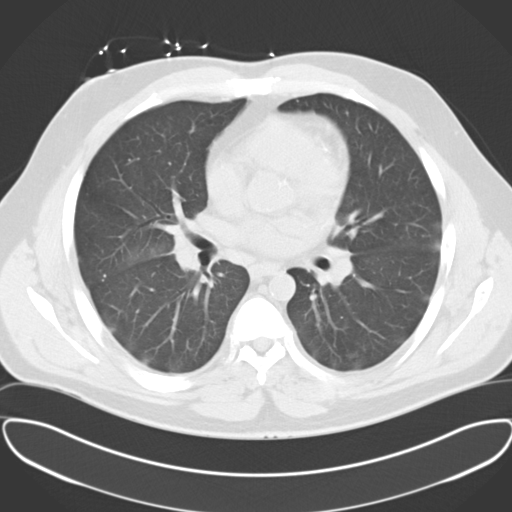
[im 39/64  lung]
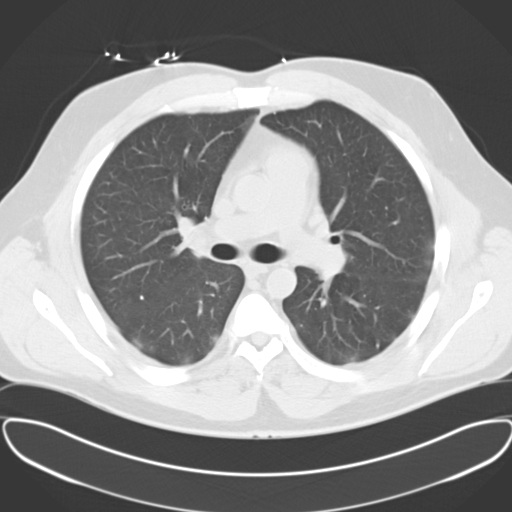
[im 44/64  lung]
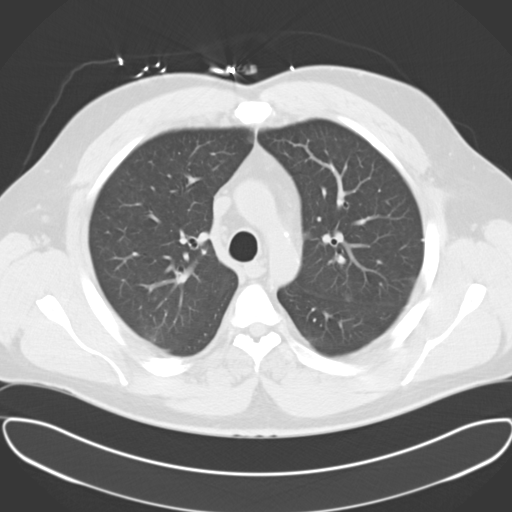
[im 49/64  lung]
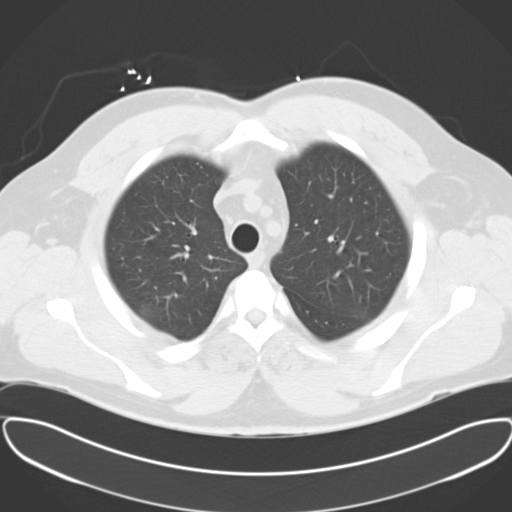
[im 54/64  mediastinal]
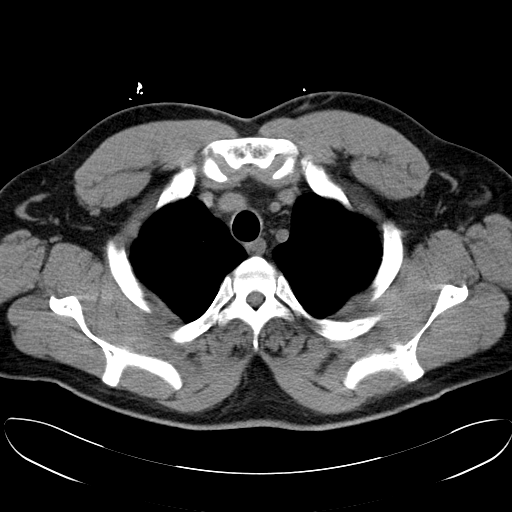
[im 54/64  lung]
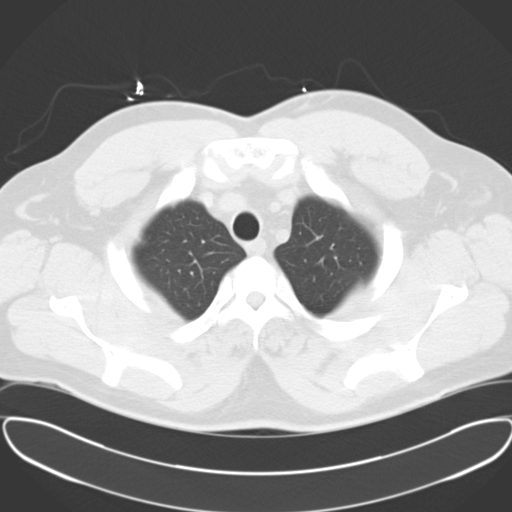
[im 59/64  lung]
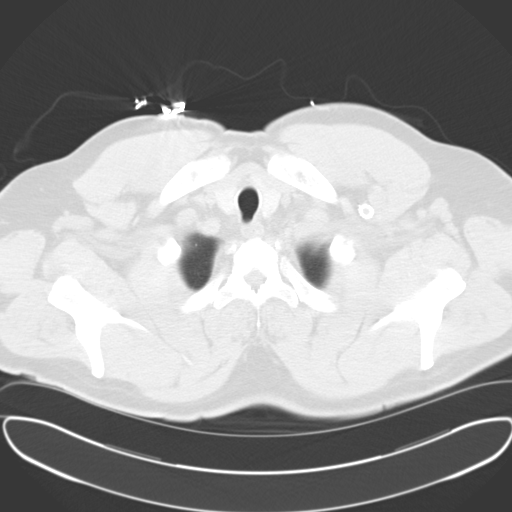

[14 of 31 positions shown; findings below may reference images not displayed]

FINDINGS: Lungs are well aerated bilaterally and demonstrate diffuse calcified
and noncalcified nodules consistent with prior granulomatous
disease. Very minimal patchy infiltrate is noted within the right
middle lobe new from the prior exam.

The thoracic inlet is within normal limits. Mild aortic
calcifications are seen without aneurysmal dilatation. Moderate
coronary calcifications are noted. No significant hilar or
mediastinal adenopathy is seen.

Scanning into the upper abdomen reveals diffuse changes in both
kidneys consistent with polycystic kidney disease. Some
calcifications are noted within these cystic lesions. The overall
appearance is similar to that seen on prior CT examination. Stent is
noted within the left cephalic vein. No gross bony abnormality is
noted.
IMPRESSION: Very early infiltrate in the right middle lobe.

Changes consistent with prior granulomatous disease.

Stable changes of polycystic kidney disease.

## 2017-04-21 ENCOUNTER — Encounter: Payer: Self-pay | Admitting: Emergency Medicine

## 2017-04-21 ENCOUNTER — Other Ambulatory Visit: Payer: Self-pay

## 2017-04-21 ENCOUNTER — Emergency Department: Payer: Medicare Other

## 2017-04-21 ENCOUNTER — Inpatient Hospital Stay
Admission: EM | Admit: 2017-04-21 | Discharge: 2017-04-24 | DRG: 871 | Disposition: A | Discharge status: Home / Self Care | Payer: Medicare Other | Attending: Internal Medicine | Admitting: Internal Medicine

## 2017-04-21 DIAGNOSIS — A419 Sepsis, unspecified organism: Secondary | ICD-10-CM | POA: Diagnosis present

## 2017-04-21 DIAGNOSIS — G629 Polyneuropathy, unspecified: Secondary | ICD-10-CM | POA: Diagnosis present

## 2017-04-21 DIAGNOSIS — Z841 Family history of disorders of kidney and ureter: Secondary | ICD-10-CM | POA: Diagnosis not present

## 2017-04-21 DIAGNOSIS — R627 Adult failure to thrive: Secondary | ICD-10-CM | POA: Diagnosis present

## 2017-04-21 DIAGNOSIS — E892 Postprocedural hypoparathyroidism: Secondary | ICD-10-CM | POA: Diagnosis present

## 2017-04-21 DIAGNOSIS — Z79899 Other long term (current) drug therapy: Secondary | ICD-10-CM | POA: Diagnosis not present

## 2017-04-21 DIAGNOSIS — Z992 Dependence on renal dialysis: Secondary | ICD-10-CM

## 2017-04-21 DIAGNOSIS — T148XXA Other injury of unspecified body region, initial encounter: Secondary | ICD-10-CM

## 2017-04-21 DIAGNOSIS — F329 Major depressive disorder, single episode, unspecified: Secondary | ICD-10-CM | POA: Diagnosis present

## 2017-04-21 DIAGNOSIS — L03115 Cellulitis of right lower limb: Secondary | ICD-10-CM | POA: Diagnosis present

## 2017-04-21 DIAGNOSIS — Z833 Family history of diabetes mellitus: Secondary | ICD-10-CM | POA: Diagnosis not present

## 2017-04-21 DIAGNOSIS — M109 Gout, unspecified: Secondary | ICD-10-CM | POA: Diagnosis present

## 2017-04-21 DIAGNOSIS — L97519 Non-pressure chronic ulcer of other part of right foot with unspecified severity: Secondary | ICD-10-CM | POA: Diagnosis present

## 2017-04-21 DIAGNOSIS — Z22322 Carrier or suspected carrier of Methicillin resistant Staphylococcus aureus: Secondary | ICD-10-CM

## 2017-04-21 DIAGNOSIS — Z888 Allergy status to other drugs, medicaments and biological substances status: Secondary | ICD-10-CM | POA: Diagnosis not present

## 2017-04-21 DIAGNOSIS — Z885 Allergy status to narcotic agent status: Secondary | ICD-10-CM

## 2017-04-21 DIAGNOSIS — I120 Hypertensive chronic kidney disease with stage 5 chronic kidney disease or end stage renal disease: Secondary | ICD-10-CM | POA: Diagnosis present

## 2017-04-21 DIAGNOSIS — M21371 Foot drop, right foot: Secondary | ICD-10-CM | POA: Diagnosis present

## 2017-04-21 DIAGNOSIS — E875 Hyperkalemia: Secondary | ICD-10-CM

## 2017-04-21 DIAGNOSIS — D631 Anemia in chronic kidney disease: Secondary | ICD-10-CM | POA: Diagnosis present

## 2017-04-21 DIAGNOSIS — F1290 Cannabis use, unspecified, uncomplicated: Secondary | ICD-10-CM | POA: Diagnosis present

## 2017-04-21 DIAGNOSIS — M21372 Foot drop, left foot: Secondary | ICD-10-CM | POA: Diagnosis present

## 2017-04-21 DIAGNOSIS — N186 End stage renal disease: Secondary | ICD-10-CM | POA: Diagnosis present

## 2017-04-21 DIAGNOSIS — G8389 Other specified paralytic syndromes: Secondary | ICD-10-CM | POA: Diagnosis present

## 2017-04-21 DIAGNOSIS — Z91041 Radiographic dye allergy status: Secondary | ICD-10-CM

## 2017-04-21 DIAGNOSIS — L02611 Cutaneous abscess of right foot: Secondary | ICD-10-CM | POA: Diagnosis present

## 2017-04-21 DIAGNOSIS — N2581 Secondary hyperparathyroidism of renal origin: Secondary | ICD-10-CM | POA: Diagnosis present

## 2017-04-21 DIAGNOSIS — L089 Local infection of the skin and subcutaneous tissue, unspecified: Secondary | ICD-10-CM

## 2017-04-21 LAB — COMPREHENSIVE METABOLIC PANEL
ALBUMIN: 3.3 g/dL — AB (ref 3.5–5.0)
ALK PHOS: 43 U/L (ref 38–126)
ALT: 7 U/L — AB (ref 17–63)
AST: 23 U/L (ref 15–41)
Anion gap: 14 (ref 5–15)
BILIRUBIN TOTAL: 0.9 mg/dL (ref 0.3–1.2)
BUN: 54 mg/dL — AB (ref 6–20)
CALCIUM: 8.1 mg/dL — AB (ref 8.9–10.3)
CO2: 24 mmol/L (ref 22–32)
CREATININE: 15.04 mg/dL — AB (ref 0.61–1.24)
Chloride: 99 mmol/L — ABNORMAL LOW (ref 101–111)
GFR calc Af Amer: 4 mL/min — ABNORMAL LOW (ref >60)
GFR, EST NON AFRICAN AMERICAN: 3 mL/min — AB (ref >60)
GLUCOSE: 141 mg/dL — AB (ref 65–99)
Potassium: 5.7 mmol/L — ABNORMAL HIGH (ref 3.5–5.1)
Sodium: 137 mmol/L (ref 135–145)
TOTAL PROTEIN: 7.2 g/dL (ref 6.5–8.1)

## 2017-04-21 LAB — CBC WITH DIFFERENTIAL/PLATELET
BASOS ABS: 0.1 10*3/uL (ref 0–0.1)
Basophils Relative: 1 %
EOS ABS: 0.2 10*3/uL (ref 0–0.7)
EOS PCT: 2 %
HCT: 32.6 % — ABNORMAL LOW (ref 40.0–52.0)
Hemoglobin: 10.4 g/dL — ABNORMAL LOW (ref 13.0–18.0)
Lymphocytes Relative: 8 %
Lymphs Abs: 1 10*3/uL (ref 1.0–3.6)
MCH: 29.4 pg (ref 26.0–34.0)
MCHC: 32 g/dL (ref 32.0–36.0)
MCV: 91.7 fL (ref 80.0–100.0)
MONO ABS: 1.2 10*3/uL — AB (ref 0.2–1.0)
Monocytes Relative: 10 %
Neutro Abs: 9.5 10*3/uL — ABNORMAL HIGH (ref 1.4–6.5)
Neutrophils Relative %: 79 %
PLATELETS: 151 10*3/uL (ref 150–440)
RBC: 3.55 MIL/uL — AB (ref 4.40–5.90)
RDW: 15.7 % — AB (ref 11.5–14.5)
WBC: 11.9 10*3/uL — AB (ref 3.8–10.6)

## 2017-04-21 LAB — LACTIC ACID, PLASMA: Lactic Acid, Venous: 1.6 mmol/L (ref 0.5–1.9)

## 2017-04-21 LAB — SEDIMENTATION RATE: Sed Rate: 75 mm/hr — ABNORMAL HIGH (ref 0–15)

## 2017-04-21 MED ORDER — ONDANSETRON HCL 4 MG/2ML IJ SOLN: 4.0000 mg | Freq: Four times a day (QID) | INTRAMUSCULAR | Status: DC | PRN

## 2017-04-21 MED ORDER — HEPARIN SODIUM (PORCINE) 5000 UNIT/ML IJ SOLN
5000.0000 [IU] | Freq: Three times a day (TID) | INTRAMUSCULAR | Status: DC
  Administered 2017-04-21 – 2017-04-24 (×6): 5000 [IU] via SUBCUTANEOUS
  Filled 2017-04-21 (×6): qty 1

## 2017-04-21 MED ORDER — CALCIUM ACETATE (PHOS BINDER) 667 MG PO CAPS
2001.0000 mg | ORAL_CAPSULE | Freq: Three times a day (TID) | ORAL | Status: DC
  Administered 2017-04-22 (×2): 2001 mg via ORAL
  Administered 2017-04-22: 1334 mg via ORAL
  Administered 2017-04-23 – 2017-04-24 (×5): 2001 mg via ORAL
  Filled 2017-04-21 (×8): qty 3

## 2017-04-21 MED ORDER — OXYCODONE HCL 5 MG PO TABS
10.0000 mg | ORAL_TABLET | Freq: Four times a day (QID) | ORAL | Status: DC
  Administered 2017-04-21 – 2017-04-24 (×10): 10 mg via ORAL
  Filled 2017-04-21 (×10): qty 2

## 2017-04-21 MED ORDER — SODIUM CHLORIDE 0.9% FLUSH: 3.0000 mL | INTRAVENOUS | Status: DC | PRN

## 2017-04-21 MED ORDER — VANCOMYCIN HCL IN DEXTROSE 1-5 GM/200ML-% IV SOLN
1000.0000 mg | Freq: Once | INTRAVENOUS | Status: AC
  Administered 2017-04-21: 1000 mg via INTRAVENOUS
  Filled 2017-04-21: qty 200

## 2017-04-21 MED ORDER — MIDODRINE HCL 5 MG PO TABS
5.0000 mg | ORAL_TABLET | Freq: Three times a day (TID) | ORAL | Status: DC
  Administered 2017-04-22 – 2017-04-24 (×7): 5 mg via ORAL
  Filled 2017-04-21 (×9): qty 1

## 2017-04-21 MED ORDER — VANCOMYCIN HCL IN DEXTROSE 1-5 GM/200ML-% IV SOLN
1000.0000 mg | INTRAVENOUS | Status: DC
  Filled 2017-04-21: qty 200

## 2017-04-21 MED ORDER — HYDROCODONE-ACETAMINOPHEN 5-325 MG PO TABS
1.0000 | ORAL_TABLET | ORAL | Status: DC | PRN
  Administered 2017-04-22: 2 via ORAL
  Filled 2017-04-21 (×2): qty 2

## 2017-04-21 MED ORDER — GABAPENTIN 300 MG PO CAPS
600.0000 mg | ORAL_CAPSULE | Freq: Every day | ORAL | Status: DC
  Administered 2017-04-21 – 2017-04-23 (×3): 600 mg via ORAL
  Filled 2017-04-21 (×3): qty 2

## 2017-04-21 MED ORDER — SODIUM CHLORIDE 0.9% FLUSH
3.0000 mL | Freq: Two times a day (BID) | INTRAVENOUS | Status: DC
  Administered 2017-04-22 – 2017-04-24 (×5): 3 mL via INTRAVENOUS

## 2017-04-21 MED ORDER — PIPERACILLIN-TAZOBACTAM 3.375 G IVPB 30 MIN: 3.3750 g | Freq: Three times a day (TID) | INTRAVENOUS | Status: DC

## 2017-04-21 MED ORDER — SODIUM CHLORIDE 0.9 % IV SOLN: 250.0000 mL | INTRAVENOUS | Status: DC | PRN

## 2017-04-21 MED ORDER — CALCIUM GLUCONATE 10 % IV SOLN
1.0000 g | Freq: Once | INTRAVENOUS | Status: AC
  Administered 2017-04-21: 1 g via INTRAVENOUS
  Filled 2017-04-21: qty 10

## 2017-04-21 MED ORDER — ACETAMINOPHEN 500 MG PO TABS
1000.0000 mg | ORAL_TABLET | Freq: Once | ORAL | Status: AC
  Administered 2017-04-21: 1000 mg via ORAL
  Filled 2017-04-21: qty 2

## 2017-04-21 MED ORDER — ACETAMINOPHEN 325 MG PO TABS: 650.0000 mg | ORAL_TABLET | Freq: Four times a day (QID) | ORAL | Status: DC | PRN

## 2017-04-21 MED ORDER — SODIUM POLYSTYRENE SULFONATE 15 GM/60ML PO SUSP
30.0000 g | Freq: Once | ORAL | Status: AC
  Administered 2017-04-21: 30 g via ORAL
  Filled 2017-04-21: qty 120

## 2017-04-21 MED ORDER — PIPERACILLIN-TAZOBACTAM 3.375 G IVPB
3.3750 g | Freq: Two times a day (BID) | INTRAVENOUS | Status: DC
  Administered 2017-04-22 – 2017-04-24 (×5): 3.375 g via INTRAVENOUS
  Filled 2017-04-21 (×5): qty 50

## 2017-04-21 MED ORDER — OXYCODONE-ACETAMINOPHEN 10-325 MG PO TABS: 1.0000 | ORAL_TABLET | Freq: Four times a day (QID) | ORAL | Status: DC

## 2017-04-21 MED ORDER — ACETAMINOPHEN 325 MG PO TABS
325.0000 mg | ORAL_TABLET | Freq: Four times a day (QID) | ORAL | Status: DC
  Administered 2017-04-21 – 2017-04-24 (×8): 325 mg via ORAL
  Filled 2017-04-21 (×9): qty 1

## 2017-04-21 MED ORDER — ACETAMINOPHEN 650 MG RE SUPP: 650.0000 mg | Freq: Four times a day (QID) | RECTAL | Status: DC | PRN

## 2017-04-21 MED ORDER — ADULT MULTIVITAMIN W/MINERALS CH
1.0000 | ORAL_TABLET | Freq: Every day | ORAL | Status: DC
  Administered 2017-04-22 – 2017-04-24 (×3): 1 via ORAL
  Filled 2017-04-21 (×3): qty 1

## 2017-04-21 MED ORDER — PIPERACILLIN-TAZOBACTAM 3.375 G IVPB 30 MIN
3.3750 g | Freq: Once | INTRAVENOUS | Status: AC
Start: 2017-04-21 — End: 2017-04-21
  Administered 2017-04-21: 3.375 g via INTRAVENOUS
  Filled 2017-04-21: qty 50

## 2017-04-21 MED ORDER — ONDANSETRON HCL 4 MG PO TABS
4.0000 mg | ORAL_TABLET | Freq: Four times a day (QID) | ORAL | Status: DC | PRN
  Administered 2017-04-22: 4 mg via ORAL
  Filled 2017-04-21: qty 1

## 2017-04-21 MED ORDER — SODIUM BICARBONATE 8.4 % IV SOLN
50.0000 meq | Freq: Once | INTRAVENOUS | Status: AC
  Administered 2017-04-21: 50 meq via INTRAVENOUS
  Filled 2017-04-21: qty 50

## 2017-04-21 NOTE — ED Notes (Signed)
Attempted IV access x 1 - unsuccessful.

## 2017-04-21 NOTE — ED Provider Notes (Signed)
[facility] Emergency Department Provider Note    First MD Initiated Contact with Patient 04/21/17 1608     (approximate)  I have reviewed the triage vital signs and the nursing notes.   HISTORY  Chief Complaint Wound Infection    HPI Patrick Downs is a 41 y.o. male with history of chronic renal insufficiency on Tuesday Thursday Saturday dialysis presents with chief complaint of right foot pain and swelling with purulent drainage from the right lateral foot.  Patient denies any injury.  Says he noted he was febrile today.  Denies any chest pain or shortness of breath.  Is not been on any antibiotics recently.  Denies any abdominal pain, nausea or vomiting.  Past Medical History:  Diagnosis Date  . Depression   . Dialysis patient ([place])   . ESRD (end stage renal disease) on dialysis ([place])   . Failure to thrive in adult   . Gout   . Guillain Barr syndrome ([place])   . HTN (hypertension)   . Kidney failure   . Pneumonia   . Renal insufficiency   . Respiratory failure ([facility])    Family History  Problem Relation Age of Onset  . Diabetes Mellitus II Father   . Kidney disease Father   . Kidney failure Paternal Grandfather   . Prostate cancer Neg Hx   . Kidney cancer Neg Hx   . Bladder Cancer Neg Hx    Past Surgical History:  Procedure Laterality Date  . AV FISTULA PLACEMENT     x5      2 graphs  . PARATHYROIDECTOMY    . RENAL BIOPSY    . tonsiilectomy    . tracheotomy     Patient Active Problem List   Diagnosis Date Noted  . Rectal pain 11/27/2014  . Encounter for imaging study to confirm nasogastric (NG) tube placement   . Encounter for nasogastric (NG) tube placement   . Respiratory failure, acute ([place])   . Guillain Barr syndrome, lower ext weakness 08/27/2014  . CAP (community acquired pneumonia) 08/23/2014  . ESRD on dialysis ([place]) 08/23/2014  . 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 ARDS (adult respiratory distress syndrome) ([place]) 08/23/2014   . Septic shock ([place]) 08/23/2014  . Hypotension 08/23/2014  . Acute respiratory failure ([place]) 08/23/2014  . Fever   . Sepsis ([place]) 08/22/2014      Prior to Admission medications   Medication Sig Start Date End Date Taking? Authorizing Provider  acetaminophen (TYLENOL) 325 MG tablet Take 2 tablets (650 mg total) by mouth every 4 (four) hours as needed for mild pain or fever (Mild pain (1-3/10) or temp greater than 100.4). Patient not taking: Reported on 06/23/2016 08/26/14   [name], MD  amitriptyline (ELAVIL) 25 MG tablet Take by mouth.    [provider]  antiseptic oral rinse (CPC / CETYLPYRIDINIUM CHLORIDE 0.05%) 0.05 % LIQD solution 7 mLs by Mouth Rinse route QID. Patient not taking: Reported on 06/23/2016 08/26/14   [name], MD  calcium acetate (PHOSLO) 667 MG capsule Take 3 capsules (2,001 mg total) by mouth 3 (three) times daily with meals. 08/26/14   [name], MD  calcium-vitamin D (OSCAL WITH D) 500-200 MG-UNIT per tablet Take 1 tablet by mouth 2 (two) times daily with a meal. Patient not taking: Reported on 06/23/2016 08/26/14   [name], MD  doxycycline (VIBRAMYCIN) 100 MG capsule TK ONE C PO  BID 06/02/15   [provider]  gabapentin (NEURONTIN) 300 MG  capsule Take 300 mg by mouth 2 (two) times daily.    [provider]  HYDROcodone-acetaminophen (NORCO/VICODIN) 5-325 MG tablet Take by mouth. 07/08/16   [provider]  hydrocortisone (ANUSOL-HC) 2.5 % rectal cream Place rectally 3 (three) times daily. Patient not taking: Reported on 06/23/2016 11/28/14   Theodoro Grist, MD  hydrOXYzine (ATARAX/VISTARIL) 25 MG tablet Take 25 mg by mouth. 03/30/15   [provider]  ibuprofen (ADVIL,MOTRIN) 400 MG tablet Take 1 tablet (400 mg total) by mouth every 6 (six) hours as needed for fever (Give for fever unrelieved by Tylenol). Patient not taking: Reported on 06/23/2016 08/26/14   [name], MD  midodrine (PROAMATINE) 5 MG  tablet Take 1 tablet (5 mg total) by mouth 3 (three) times daily with meals. Patient not taking: Reported on 06/23/2016 11/28/14   Theodoro Grist, MD  multivitamin (RENA-VIT) TABS tablet Take 1 tablet by mouth at bedtime. Patient not taking: Reported on 06/23/2016 08/26/14   [name], MD  polyethylene glycol [facility] / Floria Raveling) packet Take 17 g by mouth daily. Patient not taking: Reported on 06/23/2016 11/28/14   Theodoro Grist, MD  predniSONE (DELTASONE) 50 MG tablet Take 1 tabby mouth 13 hours before procedure, one tab by mouth 7 hours before procedure, one tab by mouth one hour before procedure 01/07/16   [provider]  tadalafil (CIALIS) 20 MG tablet Take 1 tablet (20 mg total) by mouth daily as needed for erectile dysfunction. 06/23/16   Nickie Retort, MD    Allergies Ivp dye [iodinated diagnostic agents]; Other; Minoxidil; Morphine; Morphine and related; and Omnipaque [iohexol]    Social History Social History   Tobacco Use  . Smoking status: Never Smoker  . Smokeless tobacco: Never Used  Substance Use Topics  . Alcohol use: No  . Drug use: Yes    Types: Marijuana    Review of Systems Patient denies headaches, rhinorrhea, blurry vision, numbness, shortness of breath, chest pain, edema, cough, abdominal pain, nausea, vomiting, diarrhea, dysuria, fevers, rashes or hallucinations unless otherwise stated above in HPI. ____________________________________________   PHYSICAL EXAM:  VITAL SIGNS: Vitals:   04/21/17 1715 04/21/17 1830  BP:    Pulse: 85 80  Resp: (!) 21 (!) 23  Temp:    SpO2: 94% 95%    Constitutional: Alert and oriented. Well appearing and in no acute distress. Eyes: Conjunctivae are normal.  Head: Atraumatic. Nose: No congestion/rhinnorhea. Mouth/Throat: Mucous membranes are moist.   Neck: No stridor. Painless ROM.  Cardiovascular: Normal rate, regular rhythm. Grossly normal heart sounds.  Good peripheral circulation. Respiratory: Normal  respiratory effort.  No retractions. Lungs CTAB. Gastrointestinal: Soft and nontender. No distention. No abdominal bruits. No CVA tenderness. Musculoskeletal: There is a 2-3 cm ulcer to the right lateral foot with surrounding erythema and slight edema. otherwise no lower extremity tenderness.  No joint effusions. Neurologic:  Normal speech and language. No gross focal neurologic deficits are appreciated. No facial droop Skin:  Skin is warm, dry and intact. No rash noted. Psychiatric: Mood and affect are normal. Speech and behavior are normal.  ____________________________________________   LABS (all labs ordered are listed, but only abnormal results are displayed)  Results for orders placed or performed during the hospital encounter of 04/21/17 (from the past 24 hour(s))  Lactic acid, plasma     Status: None   Collection Time: 04/21/17  3:56 PM  Result Value Ref Range   Lactic Acid, Venous 1.6 0.5 - 1.9 mmol/L  Comprehensive metabolic panel  Status: Abnormal   Collection Time: 04/21/17  3:57 PM  Result Value Ref Range   Sodium 137 135 - 145 mmol/L   Potassium 5.7 (H) 3.5 - 5.1 mmol/L   Chloride 99 (L) 101 - 111 mmol/L   CO2 24 22 - 32 mmol/L   Glucose, Bld 141 (H) 65 - 99 mg/dL   BUN 54 (H) 6 - 20 mg/dL   Creatinine, Ser 15.04 (H) 0.61 - 1.24 mg/dL   Calcium 8.1 (L) 8.9 - 10.3 mg/dL   Total Protein 7.2 6.5 - 8.1 g/dL   Albumin 3.3 (L) 3.5 - 5.0 g/dL   AST 23 15 - 41 U/L   ALT 7 (L) 17 - 63 U/L   Alkaline Phosphatase 43 38 - 126 U/L   Total Bilirubin 0.9 0.3 - 1.2 mg/dL   GFR calc non Af Amer 3 (L) >60 mL/min   GFR calc Af Amer 4 (L) >60 mL/min   Anion gap 14 5 - 15  CBC with Differential     Status: Abnormal   Collection Time: 04/21/17  3:57 PM  Result Value Ref Range   WBC 11.9 (H) 3.8 - 10.6 K/uL   RBC 3.55 (L) 4.40 - 5.90 MIL/uL   Hemoglobin 10.4 (L) 13.0 - 18.0 g/dL   HCT 32.6 (L) 40.0 - 52.0 %   MCV 91.7 80.0 - 100.0 fL   MCH 29.4 26.0 - 34.0 pg   MCHC 32.0 32.0  - 36.0 g/dL   RDW 15.7 (H) 11.5 - 14.5 %   Platelets 151 150 - 440 K/uL   Neutrophils Relative % 79 %   Neutro Abs 9.5 (H) 1.4 - 6.5 K/uL   Lymphocytes Relative 8 %   Lymphs Abs 1.0 1.0 - 3.6 K/uL   Monocytes Relative 10 %   Monocytes Absolute 1.2 (H) 0.2 - 1.0 K/uL   Eosinophils Relative 2 %   Eosinophils Absolute 0.2 0 - 0.7 K/uL   Basophils Relative 1 %   Basophils Absolute 0.1 0 - 0.1 K/uL   ____________________________________________  EKG My review and personal interpretation at Time: 18:18   Indication: hyperkalemia  Rate: 80  Rhythm: sinus Axis: normal  Other: normal intervals, no stemi ____________________________________________  RADIOLOGY  I personally reviewed all radiographic images ordered to evaluate for the above acute complaints and reviewed radiology reports and findings.  These findings were personally discussed with the patient.  Please see medical record for radiology report.  ____________________________________________   PROCEDURES  Procedure(s) performed:  .Critical Care Performed by: Merlyn Lot, MD Authorized by: Merlyn Lot, MD   Critical care provider statement:    Critical care time (minutes):  30   Critical care time was exclusive of:  Separately billable procedures and treating other patients   Critical care was time spent personally by me on the following activities:  Development of treatment plan with patient or surrogate, discussions with consultants, evaluation of patient's response to treatment, examination of patient, obtaining history from patient or surrogate, ordering and performing treatments and interventions, ordering and review of laboratory studies, ordering and review of radiographic studies, pulse oximetry, re-evaluation of patient's condition and review of old charts      Critical Care performed: no ____________________________________________   INITIAL IMPRESSION / ASSESSMENT AND PLAN / ED  COURSE  Pertinent labs & imaging results that were available during my care of the patient were reviewed by me and considered in my medical decision making (see chart for details).  DDX: Cellulitis osteo-, sepsis, bacteremia,  dehydration, electrolyte abnormality, venous insufficiency.  Patrick Downs is a 41 y.o. who presents to the ED with chief complaint of right foot wound.  Patient found to be febrile mildly tachycardic but no respiratory distress.  Blood work sent for the above differential shows evidence of leukocytosis as well as hyperkalemia for which she was temporized with IV insulin calcium and sodium bicarb.  No EKG changes.  Exam is concerning for cellulitis and foot ulcer resulting in sepsis.  Patient given IV antibiotics given acuity of his wound   Particularly in the setting of chronic dialysis and concern for component of being immunocompromised.  Discussed case with Dr. Jerelyn Charles of hospitalist group.  Dr. Candiss Norse of nephrology is aware patient in need for dialysis.  Patient will be admitted to the hospital for further medical management.      ____________________________________________   FINAL CLINICAL IMPRESSION(S) / ED DIAGNOSES  Final diagnoses:  Wound infection  Hyperkalemia      NEW MEDICATIONS STARTED DURING THIS VISIT:  This SmartLink is deprecated. Use AVSMEDLIST instead to display the medication list for a patient.   Note:  This document was prepared using Dragon voice recognition software and may include unintentional dictation errors.    Merlyn Lot, MD 04/21/17 1900

## 2017-04-21 NOTE — ED Triage Notes (Signed)
Pt with large, draining wound to right pinky toe. Reports he had a patch of dry skin and pulled it off, causing it to bleed. Pt's right leg is warm, red and swollen to touch below the knee. Pt is dialysis patient, missed dialysis today.

## 2017-04-21 NOTE — ED Notes (Signed)
Patient transported to X-ray 

## 2017-04-21 NOTE — Progress Notes (Signed)
ANTIBIOTIC CONSULT NOTE - INITIAL  Pharmacy Consult for Zosyn/vancomycin Indication: cellulitis  Allergies  Allergen Reactions  . Ivp Dye [Iodinated Diagnostic Agents] Other (See Comments)    Reaction unknown  . Other     Other reaction(s): Unknown Reaction unknown  . Minoxidil Other (See Comments)    "put fluid around my heart" "put fluid around my heart" PERICARDIAL EFFUSION Reaction unknown  . Morphine Other (See Comments)    Other reaction(s): Unknown Reaction unknown Makes patient aggressive  . Morphine And Related Other (See Comments)    Reaction unknown  . Omnipaque [Iohexol] Other (See Comments) and Itching    Rigors on one occasion, widespread itching on a separate occasion (resolved with Benadryl). Reaction unknown Reaction unknown tremors Rigors on one occasion, widespread itching on a separate occasion (resolved with Benadryl). Reaction unknown    Patient Measurements: Height: 6\' 2"  (188 cm) Weight: 250 lb (113.4 kg) IBW/kg (Calculated) : 82.2 Adjusted Body Weight:   Vital Signs: Temp: 100.5 F (38.1 C) (12/29 1532) Temp Source: Oral (12/29 1532) BP: 137/106 (12/29 1532) Pulse Rate: 85 (12/29 1715) Intake/Output from previous day: No intake/output data recorded. Intake/Output from this shift: No intake/output data recorded.  Labs: Recent Labs    04/21/17 1557  WBC 11.9*  HGB 10.4*  PLT 151  CREATININE 15.04*   Estimated Creatinine Clearance: 8.7 mL/min (A) (by C-G formula based on SCr of 15.04 mg/dL (H)). No results for input(s): VANCOTROUGH, VANCOPEAK, VANCORANDOM, GENTTROUGH, GENTPEAK, GENTRANDOM, TOBRATROUGH, TOBRAPEAK, TOBRARND, AMIKACINPEAK, AMIKACINTROU, AMIKACIN in the last 72 hours.   Microbiology: No results found for this or any previous visit (from the past 720 hour(s)).  Medical History: Past Medical History:  Diagnosis Date  . Depression   . Dialysis patient ([place])   . ESRD (end stage renal disease) on dialysis ([place])   .  Failure to thrive in adult   . Gout   . Guillain Barr syndrome ([place])   . HTN (hypertension)   . Kidney failure   . Pneumonia   . Renal insufficiency   . Respiratory failure ([facility])     Medications:  Infusions:  . calcium gluconate 1 GM IV    . [START ON 04/22/2017] piperacillin-tazobactam (ZOSYN)  IV    . vancomycin 1,000 mg (04/21/17 1801)  . vancomycin    . [START ON 04/24/2017] vancomycin     Assessment: 41 yom cc large, draining wound to right pinky toe. Patient missed HD today so I imagine his HD schedule is TTS but will f/u when more information available. Pharmacy consulted to dose Zosyn and vancomycin for celllulitis.  Goal of Therapy:  Vancomycin trough level 10-15 mcg/ml  Plan:  1. Zosyn 3.375 gm IV Q12H EI 2. Vancomycin 1 gm IV x 1 in ED, will give another 1 gm IV x 1 when patient gets to the floor to make a total 2 gm IV x 1 load, then continue with vancomycin 1 gm IV Q-dialysis Tuesday/Thursday/Saturday pending HD schedule confirmation. Pharmacy will continue to follow and adjust as needed to maintain random 10 to 15 mcg/mL.   Laural Benes, Pharm.D., BCPS Clinical Pharmacist 04/21/2017,6:04 PM

## 2017-04-21 NOTE — H&P (Signed)
[facility] [place] NAME: Patrick Downs    MR#:  [id number]  DATE OF BIRTH:  05-Jul-1975  DATE OF ADMISSION:  04/21/2017  PRIMARY CARE PHYSICIAN: Leonel Ramsay, MD   REQUESTING/REFERRING PHYSICIAN:   CHIEF COMPLAINT:   Chief Complaint  Patient presents with  . Wound Infection    HISTORY OF PRESENT ILLNESS: Patrick Downs  is a 41 y.o. male with a known history per below presented to the emergency room with right foot wound with associated swelling, redness, end-stage renal disease-missed dialysis on today, in the emergency room patient was found to be febrile, tachypnea, potassium 5.7, creatinine 15, white count 11,000, examination noted for acute right fifth toe wound with associated cellulitis/pedal edema consistent with sepsis, patient now been admitted for acute sepsis secondary to acute infected right foot wound.  PAST MEDICAL HISTORY:   Past Medical History:  Diagnosis Date  . Depression   . Dialysis patient ([place])   . ESRD (end stage renal disease) on dialysis ([place])   . Failure to thrive in adult   . Gout   . Guillain Barr syndrome ([place])   . HTN (hypertension)   . Kidney failure   . Pneumonia   . Renal insufficiency   . Respiratory failure ([place])     PAST SURGICAL HISTORY:  Past Surgical History:  Procedure Laterality Date  . AV FISTULA PLACEMENT     x5      2 graphs  . PARATHYROIDECTOMY    . RENAL BIOPSY    . tonsiilectomy    . tracheotomy      SOCIAL HISTORY:  Social History   Tobacco Use  . Smoking status: Never Smoker  . Smokeless tobacco: Never Used  Substance Use Topics  . Alcohol use: No    FAMILY HISTORY:  Family History  Problem Relation Age of Onset  . Diabetes Mellitus II Father   . Kidney disease Father   . Kidney failure Paternal Grandfather   . Prostate cancer Neg Hx   . Kidney cancer Neg Hx   . Bladder Cancer Neg Hx     DRUG ALLERGIES:  Allergies  Allergen Reactions  . Ivp Dye  [Iodinated Diagnostic Agents] Other (See Comments)    Reaction unknown  . Other     Other reaction(s): Unknown Reaction unknown  . Minoxidil Other (See Comments)    "put fluid around my heart" "put fluid around my heart" PERICARDIAL EFFUSION Reaction unknown  . Morphine Other (See Comments)    Other reaction(s): Unknown Reaction unknown Makes patient aggressive  . Morphine And Related Other (See Comments)    Reaction unknown  . Omnipaque [Iohexol] Other (See Comments) and Itching    Rigors on one occasion, widespread itching on a separate occasion (resolved with Benadryl). Reaction unknown Reaction unknown tremors Rigors on one occasion, widespread itching on a separate occasion (resolved with Benadryl). Reaction unknown    REVIEW OF SYSTEMS:   CONSTITUTIONAL: No fever, fatigue or weakness.  EYES: No blurred or double vision.  EARS, NOSE, AND THROAT: No tinnitus or ear pain.  RESPIRATORY: No cough, shortness of breath, wheezing or hemoptysis.  CARDIOVASCULAR: No chest pain, orthopnea, edema.  GASTROINTESTINAL: No nausea, vomiting, diarrhea or abdominal pain.  GENITOURINARY: No dysuria, hematuria.  ENDOCRINE: No polyuria, nocturia,  HEMATOLOGY: No anemia, easy bruising or bleeding SKIN: Right foot wound with associated swelling/redness MUSCULOSKELETAL: No joint pain or arthritis.   NEUROLOGIC: No tingling, numbness, weakness.  PSYCHIATRY: No anxiety or  depression.   MEDICATIONS AT HOME:  Prior to Admission medications   Medication Sig Start Date End Date Taking? Authorizing Provider  acetaminophen (TYLENOL) 325 MG tablet Take 2 tablets (650 mg total) by mouth every 4 (four) hours as needed for mild pain or fever (Mild pain (1-3/10) or temp greater than 100.4). Patient not taking: Reported on 06/23/2016 08/26/14   [name], MD  amitriptyline (ELAVIL) 25 MG tablet Take by mouth.    [provider]  antiseptic oral rinse (CPC / CETYLPYRIDINIUM CHLORIDE 0.05%)  0.05 % LIQD solution 7 mLs by Mouth Rinse route QID. Patient not taking: Reported on 06/23/2016 08/26/14   [name], MD  calcium acetate (PHOSLO) 667 MG capsule Take 3 capsules (2,001 mg total) by mouth 3 (three) times daily with meals. 08/26/14   [name], MD  calcium-vitamin D (OSCAL WITH D) 500-200 MG-UNIT per tablet Take 1 tablet by mouth 2 (two) times daily with a meal. Patient not taking: Reported on 06/23/2016 08/26/14   [name], MD  doxycycline (VIBRAMYCIN) 100 MG capsule TK ONE C PO  BID 06/02/15   [provider]  gabapentin (NEURONTIN) 300 MG capsule Take 300 mg by mouth 2 (two) times daily.    [provider]  HYDROcodone-acetaminophen (NORCO/VICODIN) 5-325 MG tablet Take by mouth. 07/08/16   [provider]  hydrocortisone (ANUSOL-HC) 2.5 % rectal cream Place rectally 3 (three) times daily. Patient not taking: Reported on 06/23/2016 11/28/14   Theodoro Grist, MD  hydrOXYzine (ATARAX/VISTARIL) 25 MG tablet Take 25 mg by mouth. 03/30/15   [provider]  ibuprofen (ADVIL,MOTRIN) 400 MG tablet Take 1 tablet (400 mg total) by mouth every 6 (six) hours as needed for fever (Give for fever unrelieved by Tylenol). Patient not taking: Reported on 06/23/2016 08/26/14   [name], MD  midodrine (PROAMATINE) 5 MG tablet Take 1 tablet (5 mg total) by mouth 3 (three) times daily with meals. Patient not taking: Reported on 06/23/2016 11/28/14   Theodoro Grist, MD  multivitamin (RENA-VIT) TABS tablet Take 1 tablet by mouth at bedtime. Patient not taking: Reported on 06/23/2016 08/26/14   [name], MD  polyethylene glycol [facility] / Floria Raveling) packet Take 17 g by mouth daily. Patient not taking: Reported on 06/23/2016 11/28/14   Theodoro Grist, MD  predniSONE (DELTASONE) 50 MG tablet Take 1 tabby mouth 13 hours before procedure, one tab by mouth 7 hours before procedure, one tab by mouth one hour before procedure 01/07/16   [provider]  tadalafil  (CIALIS) 20 MG tablet Take 1 tablet (20 mg total) by mouth daily as needed for erectile dysfunction. 06/23/16   Nickie Retort, MD      PHYSICAL EXAMINATION:   VITAL SIGNS: Blood pressure (!) 137/106, pulse 80, temperature (!) 100.5 F (38.1 C), temperature source Oral, resp. rate (!) 23, height 6\' 2"  (1.88 m), weight 113.4 kg (250 lb), SpO2 95 %.  GENERAL:  41 y.o.-year-old patient lying in the bed with no acute distress.  EYES: Pupils equal, round, reactive to light and accommodation. No scleral icterus. Extraocular muscles intact.  HEENT: Head atraumatic, normocephalic. Oropharynx and nasopharynx clear.  NECK:  Supple, no jugular venous distention. No thyroid enlargement, no tenderness.  LUNGS: Normal breath sounds bilaterally, no wheezing, rales,rhonchi or crepitation. No use of accessory muscles of respiration.  CARDIOVASCULAR: S1, S2 normal. No murmurs, rubs, or gallops.  ABDOMEN: Soft, nontender, nondistended. Bowel sounds present. No organomegaly or mass.  EXTREMITIES: No pedal edema, cyanosis, or clubbing.  NEUROLOGIC: Cranial nerves II through XII are intact. MAES. Gait not checked.  PSYCHIATRIC: The patient is alert and oriented x 3.  SKIN: Acute right fifth toe skin ulceration with associated infection/cellulitis, pedal edema  LABORATORY PANEL:   CBC Recent Labs  Lab 04/21/17 1557  WBC 11.9*  HGB 10.4*  HCT 32.6*  PLT 151  MCV 91.7  MCH 29.4  MCHC 32.0  RDW 15.7*  LYMPHSABS 1.0  MONOABS 1.2*  EOSABS 0.2  BASOSABS 0.1   ------------------------------------------------------------------------------------------------------------------  Chemistries  Recent Labs  Lab 04/21/17 1557  NA 137  K 5.7*  CL 99*  CO2 24  GLUCOSE 141*  BUN 54*  CREATININE 15.04*  CALCIUM 8.1*  AST 23  ALT 7*  ALKPHOS 43  BILITOT 0.9   ------------------------------------------------------------------------------------------------------------------ estimated creatinine  clearance is 8.7 mL/min (A) (by C-G formula based on SCr of 15.04 mg/dL (H)). ------------------------------------------------------------------------------------------------------------------ No results for input(s): TSH, T4TOTAL, T3FREE, THYROIDAB in the last 72 hours.  Invalid input(s): FREET3   Coagulation profile No results for input(s): INR, PROTIME in the last 168 hours. ------------------------------------------------------------------------------------------------------------------- No results for input(s): DDIMER in the last 72 hours. -------------------------------------------------------------------------------------------------------------------  Cardiac Enzymes No results for input(s): CKMB, TROPONINI, MYOGLOBIN in the last 168 hours.  Invalid input(s): CK ------------------------------------------------------------------------------------------------------------------ Invalid input(s): POCBNP  ---------------------------------------------------------------------------------------------------------------  Urinalysis No results found for: COLORURINE, APPEARANCEUR, LABSPEC, PHURINE, GLUCOSEU, HGBUR, BILIRUBINUR, KETONESUR, PROTEINUR, UROBILINOGEN, NITRITE, LEUKOCYTESUR   RADIOLOGY: Dg Chest 2 View  Result Date: 04/21/2017 CLINICAL DATA:  Fever, sepsis, dialysis patient EXAM: CHEST  2 VIEW COMPARISON:  11/26/2014 chest radiograph. FINDINGS: Left internal jugular central venous catheter terminates at the cavoatrial junction. Vascular stent overlies the left axillary region. Stable cardiomediastinal silhouette with normal heart size. No pneumothorax. No pleural effusion. Lungs appear clear, with no acute consolidative airspace disease and no pulmonary edema. IMPRESSION: No active cardiopulmonary disease. Electronically Signed   By: Ilona Sorrel M.D.   On: 04/21/2017 18:22   Dg Foot Complete Right  Result Date: 04/21/2017 CLINICAL DATA:  41 year old male with nonhealing  wound in the right fifth toe. EXAM: RIGHT FOOT COMPLETE - 3+ VIEW COMPARISON:  None. FINDINGS: There is no acute fracture or dislocation. No bone erosion or periosteal reaction. There diffuse soft tissue swelling of the foot primarily over the dorsum of the foot. A small skin defect noted over the lateral aspect of the fifth toe. No soft tissue gas or radiopaque foreign object. Vascular calcifications noted. IMPRESSION: 1. No acute osseous pathology. No evidence of osteomyelitis by radiograph. 2. Diffuse soft tissue swelling of the foot and small skin defect/ulcer over the lateral aspect of the fifth digit. No soft tissue gas. 3. Vascular calcification. Electronically Signed   By: Anner Crete M.D.   On: 04/21/2017 18:24    EKG: Orders placed or performed during the hospital encounter of 04/21/17  . ED EKG  . ED EKG  . EKG 12-Lead  . EKG 12-Lead    IMPRESSION AND PLAN: 1 acute sepsis Secondary to #2 Admit to regular nursing floor bed on our sepsis protocol, empiric vancomycin/Zosyn, follow-up on cultures  2 acute infected right foot wound with associated cellulitis Admit to regular nursing floor bed, check wound cultures, empiric vancomycin/Zosyn, consult podiatry for expert, follow-up on cultures, and continue close medical monitoring  3 chronic end-stage renal disease on hemodialysis Missed hemodialysis on today Consult nephrology for hemodialysis needs-plans for hemodialysis on tomorrow-nephrology notified per ED attending  4 acute hyperkalemia Secondary to end-stage renal disease/missed hemodialysis Kayexalate x1, placed on  telemetry bed, BMP in the morning  5 incomplete MAR Complete when available  Full code Patient stable Prognosis fair DVT prophylaxis with heparin subcu Disposition Home days barring any complications      All the records are reviewed and case discussed with ED provider. Management plans discussed with the patient, family and they are in  agreement.  CODE STATUS: Code Status History    Date Active Date Inactive Code Status Order ID Comments User Context   11/27/2014 03:41 11/28/2014 19:04 Full Code [id number]  Lance Coon, MD Inpatient   08/26/2014 21:38 10/05/2014 16:37 Full Code [id number]  Corey Harold Inpatient   08/22/2014 14:57 08/26/2014 21:38 Full Code [id number]  Renea Ee, RN Inpatient       TOTAL TIME TAKING CARE OF THIS PATIENT: 45 minutes.    Avel Peace Salary M.D on 04/21/2017   Between 7am to 6pm - Pager - [phone]  After 6pm go to [email or web address] - password [email or web address]  Office  [phone]  CC: Primary care physician; Leonel Ramsay, MD   Note: This dictation was prepared with Dragon dictation along with smaller phrase technology. Any transcriptional errors that result from this process are unintentional.

## 2017-04-21 NOTE — ED Notes (Signed)
Kirke Shaggy RN, aware of bed assigned

## 2017-04-22 ENCOUNTER — Other Ambulatory Visit: Payer: Self-pay

## 2017-04-22 LAB — CBC
HCT: 36.1 % — ABNORMAL LOW (ref 40.0–52.0)
HEMOGLOBIN: 11.8 g/dL — AB (ref 13.0–18.0)
MCH: 29.8 pg (ref 26.0–34.0)
MCHC: 32.5 g/dL (ref 32.0–36.0)
MCV: 91.5 fL (ref 80.0–100.0)
PLATELETS: 133 10*3/uL — AB (ref 150–440)
RBC: 3.95 MIL/uL — AB (ref 4.40–5.90)
RDW: 16 % — ABNORMAL HIGH (ref 11.5–14.5)
WBC: 10.2 10*3/uL (ref 3.8–10.6)

## 2017-04-22 LAB — MRSA PCR SCREENING: MRSA BY PCR: POSITIVE — AB

## 2017-04-22 MED ORDER — CHLORHEXIDINE GLUCONATE CLOTH 2 % EX PADS
6.0000 | MEDICATED_PAD | Freq: Every day | CUTANEOUS | Status: DC
  Administered 2017-04-22 – 2017-04-24 (×3): 6 via TOPICAL

## 2017-04-22 MED ORDER — DIPHENHYDRAMINE HCL 50 MG/ML IJ SOLN
12.5000 mg | Freq: Once | INTRAMUSCULAR | Status: AC
  Administered 2017-04-22: 12.5 mg via INTRAVENOUS
  Filled 2017-04-22: qty 1

## 2017-04-22 MED ORDER — MUPIROCIN 2 % EX OINT
1.0000 "application " | TOPICAL_OINTMENT | Freq: Two times a day (BID) | CUTANEOUS | Status: DC
  Administered 2017-04-22 – 2017-04-24 (×5): 1 via NASAL
  Filled 2017-04-22: qty 22

## 2017-04-22 NOTE — Progress Notes (Signed)
[facility], [place] 04/22/17  Subjective:   Patient is known to our practice from previous admissions. He has come in for evaluation of right pinky toe wound.  He is diagnosed with cellulitis and is admitted for IV antibiotics. He states that he due to outpatient holiday schedule changes, he was scheduled to get dialysis today.    Nephrology consult has been requested for evaluation of ESRD and to arrange dialysis. At present he is on room air, no shortness of breath.  Appetite is good.  He is able to eat without nausea or vomiting.     Objective:  Vital signs in last 24 hours:  Temp:  [97.4 F (36.3 C)-100.5 F (38.1 C)] 98.5 F (36.9 C) (12/30 1132) Pulse Rate:  [77-103] 91 (12/30 1132) Resp:  [15-23] 17 (12/30 1132) BP: (91-137)/(56-106) 114/73 (12/30 1132) SpO2:  [60 %-98 %] 60 % (12/30 1132) Weight:  [113.2 kg (249 lb 9.6 oz)-113.4 kg (250 lb)] 113.2 kg (249 lb 9.6 oz) (12/29 2111)  Weight change:  Filed Weights   04/21/17 1532 04/21/17 2111  Weight: 113.4 kg (250 lb) 113.2 kg (249 lb 9.6 oz)    Intake/Output:    Intake/Output Summary (Last 24 hours) at 04/22/2017 1311 Last data filed at 04/22/2017 1126 Gross per 24 hour  Intake 39 ml  Output 1 ml  Net 38 ml     Physical Exam: General:  Sitting up in the bed, no acute distress  HEENT  anicteric, moist oral mucous membranes  Neck  supple  Pulm/lungs  normal breathing effort, clear to auscultation  CVS/Heart  regular rhythm, no rub or gallop  Abdomen:   Soft, nontender  Extremities:  Right lower extremity edema,, right foot is swollen  Neurologic:  Alert, oriented  Skin:  Redness over the right lower extremity, bandaged toes  Access:  Left IJ PermCath       Basic Metabolic Panel:  Recent Labs  Lab 04/21/17 1557  NA 137  K 5.7*  CL 99*  CO2 24  GLUCOSE 141*  BUN 54*  CREATININE 15.04*  CALCIUM 8.1*     CBC: Recent Labs  Lab 04/21/17 1557 04/22/17 0413  WBC 11.9*  10.2  NEUTROABS 9.5*  --   HGB 10.4* 11.8*  HCT 32.6* 36.1*  MCV 91.7 91.5  PLT 151 133*      Lab Results  Component Value Date   HEPBSAG Negative 11/27/2014   HEPBSAB Reactive 09/28/2014   HEPBIGM NON REACTIVE 08/29/2014      Microbiology:  Recent Results (from the past 240 hour(s))  MRSA PCR Screening     Status: Abnormal   Collection Time: 04/22/17  7:35 AM  Result Value Ref Range Status   MRSA by PCR POSITIVE (A) NEGATIVE Final    Comment:        The GeneXpert MRSA Assay (FDA approved for NASAL specimens only), is one component of a comprehensive MRSA colonization surveillance program. It is not intended to diagnose MRSA infection nor to guide or monitor treatment for MRSA infections. RESULT CALLED TO, READ BACK BY AND VERIFIED WITH: Lum Babe ORNELAS AT 1062 ON 04/22/17 BY [facility] Performed at [facility], [address] [place], [place] [address]     Coagulation Studies: No results for input(s): LABPROT, INR in the last 72 hours.  Urinalysis: No results for input(s): COLORURINE, LABSPEC, PHURINE, GLUCOSEU, HGBUR, BILIRUBINUR, KETONESUR, PROTEINUR, UROBILINOGEN, NITRITE, LEUKOCYTESUR in the last 72 hours.  Invalid input(s): APPERANCEUR    Imaging:  Dg Chest 2 View  Result Date: 04/21/2017 CLINICAL DATA:  Fever, sepsis, dialysis patient EXAM: CHEST  2 VIEW COMPARISON:  11/26/2014 chest radiograph. FINDINGS: Left internal jugular central venous catheter terminates at the cavoatrial junction. Vascular stent overlies the left axillary region. Stable cardiomediastinal silhouette with normal heart size. No pneumothorax. No pleural effusion. Lungs appear clear, with no acute consolidative airspace disease and no pulmonary edema. IMPRESSION: No active cardiopulmonary disease. Electronically Signed   By: Ilona Sorrel M.D.   On: 04/21/2017 18:22   Dg Foot Complete Right  Result Date: 04/21/2017 CLINICAL DATA:  41 year old male with nonhealing wound in  the right fifth toe. EXAM: RIGHT FOOT COMPLETE - 3+ VIEW COMPARISON:  None. FINDINGS: There is no acute fracture or dislocation. No bone erosion or periosteal reaction. There diffuse soft tissue swelling of the foot primarily over the dorsum of the foot. A small skin defect noted over the lateral aspect of the fifth toe. No soft tissue gas or radiopaque foreign object. Vascular calcifications noted. IMPRESSION: 1. No acute osseous pathology. No evidence of osteomyelitis by radiograph. 2. Diffuse soft tissue swelling of the foot and small skin defect/ulcer over the lateral aspect of the fifth digit. No soft tissue gas. 3. Vascular calcification. Electronically Signed   By: Anner Crete M.D.   On: 04/21/2017 18:24     Medications:   . sodium chloride    . piperacillin-tazobactam (ZOSYN)  IV Stopped (04/22/17 1126)  . [START ON 04/24/2017] vancomycin     . oxyCODONE  10 mg Oral Q6H   And  . acetaminophen  325 mg Oral Q6H  . calcium acetate  2,001 mg Oral TID WC  . Chlorhexidine Gluconate Cloth  6 each Topical Q0600  . gabapentin  600 mg Oral QHS  . heparin  5,000 Units Subcutaneous Q8H  . midodrine  5 mg Oral TID WC  . multivitamin with minerals  1 tablet Oral Daily  . mupirocin ointment  1 application Nasal BID  . sodium chloride flush  3 mL Intravenous Q12H   sodium chloride, acetaminophen **OR** acetaminophen, HYDROcodone-acetaminophen, ondansetron **OR** ondansetron (ZOFRAN) IV, sodium chloride flush  Assessment/ Plan:  41 y.o. African-American male with end-stage renal disease, hypertension, history of Ethelene Hal syndrome presents for evaluation of large, draining wound to right little toe.  He is diagnosed with acute infection of right foot wound with associated cellulitis and is admitted for IV antibiotics  1.  End-stage renal disease 2.  Hyperkalemia 3.  Anemia of chronic kidney disease, hemoglobin 11.8 (12/30) 4.  Secondary hyperparathyroidism 5.  Right foot  cellulitis  Patient's volume status is acceptable.  He had mild hyperkalemia and was treated with Kayexalate.  We will plan on dialyzing him tomorrow.  Continue calcium acetate with meals.  Midodrine for low blood pressure.  IV antibiotics per hospitalist team.  Please consult with pharmacy for dose adjustment for ESRD.  Low-dose Procrit with dialysis.  Monitor phosphorus during hospital admission.   LOS: 1 Rowan Blaker 12/30/20181:11 PM  [facility] [place], [place]

## 2017-04-22 NOTE — Consult Note (Addendum)
ORTHOPAEDIC CONSULTATION  REQUESTING PHYSICIAN: Vaughan Basta, *  Chief Complaint: Right 5th toe infection  HPI: Patrick Downs is a 41 y.o. male who complains of right 5th toe infection.  He states about a day or so ago he noticed a piece of dead skin on his right fifth toe.  He pulled this off and noted some drainage.  Was concerned about infection and came to the ER.  Noted purulent drainage from the ulcerative site was seen in the ER and admitted for IV antibiotics and further evaluation.  Past Medical History:  Diagnosis Date  . Depression   . Dialysis patient ([place])   . ESRD (end stage renal disease) on dialysis ([place])   . Failure to thrive in adult   . Gout   . Guillain Barr syndrome ([place])   . HTN (hypertension)   . Kidney failure   . Pneumonia   . Renal insufficiency   . Respiratory failure [facility])    Past Surgical History:  Procedure Laterality Date  . AV FISTULA PLACEMENT     x5      2 graphs  . PARATHYROIDECTOMY    . RENAL BIOPSY    . tonsiilectomy    . tracheotomy     Social History   Socioeconomic History  . Marital status: Divorced    Spouse name: None  . Number of children: None  . Years of education: None  . Highest education level: None  Social Needs  . Financial resource strain: None  . Food insecurity - worry: None  . Food insecurity - inability: None  . Transportation needs - medical: None  . Transportation needs - non-medical: None  Occupational History  . None  Tobacco Use  . Smoking status: Never Smoker  . Smokeless tobacco: Never Used  Substance and Sexual Activity  . Alcohol use: No  . Drug use: Yes    Types: Marijuana  . Sexual activity: None    Comment: did not ask, mother present  Other Topics Concern  . None  Social History Narrative  . None   Family History  Problem Relation Age of Onset  . Diabetes Mellitus II Father   . Kidney disease Father   . Kidney failure Paternal Grandfather   . Prostate cancer Neg Hx    . Kidney cancer Neg Hx   . Bladder Cancer Neg Hx    Allergies  Allergen Reactions  . Ivp Dye [Iodinated Diagnostic Agents] Other (See Comments)    Reaction unknown  . Other     Other reaction(s): Unknown Reaction unknown  . Minoxidil Other (See Comments)    "put fluid around my heart" "put fluid around my heart" PERICARDIAL EFFUSION Reaction unknown  . Morphine Other (See Comments)    Other reaction(s): Unknown Reaction unknown Makes patient aggressive  . Morphine And Related Other (See Comments)    Reaction unknown  . Omnipaque [Iohexol] Other (See Comments) and Itching    Rigors on one occasion, widespread itching on a separate occasion (resolved with Benadryl). Reaction unknown Reaction unknown tremors Rigors on one occasion, widespread itching on a separate occasion (resolved with Benadryl). Reaction unknown   Prior to Admission medications   Medication Sig Start Date End Date Taking? Authorizing Provider  calcium acetate (PHOSLO) 667 MG capsule Take 3 capsules (2,001 mg total) by mouth 3 (three) times daily with meals. 08/26/14  Yes Wieting, Richard, MD  gabapentin (NEURONTIN) 300 MG capsule Take 600 mg by mouth at bedtime.    Yes [provider]  midodrine (PROAMATINE) 5 MG tablet Take 1 tablet (5 mg total) by mouth 3 (three) times daily with meals. 11/28/14  Yes Theodoro Grist, MD  oxyCODONE-acetaminophen (PERCOCET) 10-325 MG tablet Take 1 tablet by mouth every 6 (six) hours. 03/21/17  Yes [provider]  sildenafil (REVATIO) 20 MG tablet Take 20-100 mg by mouth as needed. 03/02/17  Yes [provider]   Dg Chest 2 View  Result Date: 04/21/2017 CLINICAL DATA:  Fever, sepsis, dialysis patient EXAM: CHEST  2 VIEW COMPARISON:  11/26/2014 chest radiograph. FINDINGS: Left internal jugular central venous catheter terminates at the cavoatrial junction. Vascular stent overlies the left axillary region. Stable cardiomediastinal silhouette with normal  heart size. No pneumothorax. No pleural effusion. Lungs appear clear, with no acute consolidative airspace disease and no pulmonary edema. IMPRESSION: No active cardiopulmonary disease. Electronically Signed   By: Ilona Sorrel M.D.   On: 04/21/2017 18:22   Dg Foot Complete Right  Result Date: 04/21/2017 CLINICAL DATA:  41 year old male with nonhealing wound in the right fifth toe. EXAM: RIGHT FOOT COMPLETE - 3+ VIEW COMPARISON:  None. FINDINGS: There is no acute fracture or dislocation. No bone erosion or periosteal reaction. There diffuse soft tissue swelling of the foot primarily over the dorsum of the foot. A small skin defect noted over the lateral aspect of the fifth toe. No soft tissue gas or radiopaque foreign object. Vascular calcifications noted. IMPRESSION: 1. No acute osseous pathology. No evidence of osteomyelitis by radiograph. 2. Diffuse soft tissue swelling of the foot and small skin defect/ulcer over the lateral aspect of the fifth digit. No soft tissue gas. 3. Vascular calcification. Electronically Signed   By: Anner Crete M.D.   On: 04/21/2017 18:24    Positive ROS: All other systems have been reviewed and were otherwise negative with the exception of those mentioned in the HPI and as above.  12 point ROS was performed.  Physical Exam: General: Alert and oriented.  No apparent distress.  Vascular:  Left foot:Dorsalis Pedis:  diminished Posterior Tibial:  absent  Right foot: Dorsalis Pedis:  diminished Posterior Tibial:  diminished  Noted edema to bilateral lower extremities.  Foot is warm to touch.  Neuro:absent protective sensation to the lower extremities.  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  Derm: Left foot without ulceration.  Right foot open ulceration on the lateral right fifth toe at the PIPJ.  This probes into the subcutaneous tissue but not down to bone.  Probe slightly back along the lateral aspect of the fifth toe.  Does  not probe dorsal to plantar.  Purulent drainage from the area and this was cultured today.  Superficial sloughing of ulceration noted around the PIPJ dorsally.  Approximately a centimeter and a half in diameter.  Ortho/MS: Bilateral lower extremity edema.  Bilateral drop foot is noted.  Right foot edema is worse than the left side.  X-rays reviewed today are negative for erosive changes or gas in the soft tissue.  Wound culture performed today.  Patient has positive MRSA on PCR from nasal swab.  CBC Latest Ref Rng & Units 04/22/2017 04/21/2017 11/28/2014  WBC 3.8 - 10.6 K/uL 10.2 11.9(H) 11.0(H)  Hemoglobin 13.0 - 18.0 g/dL 11.8(L) 10.4(L) 10.8(L)  Hematocrit 40.0 - 52.0 % 36.1(L) 32.6(L) 32.1(L)  Platelets 150 - 440 K/uL 133(L) 151 126(L)     Assessment: Neuropathic right fifth toe ulceration with infection and purulent drainage  Plan: X-rays were negative for osteomyelitis.  The ulcer is infected but does not probe to bone.  Consideration for MRI could be performed but there is no obvious cortical destruction on x-ray and at this time I would still recommend treatment with local wound care and antibiotics even if MRI was positive for osteomyelitis at this time.Marland Kitchen  He can receive antibiotics at dialysis.  Deep wound culture was performed today.  Dressing was changed.  Will have dressings changed daily.  IV antibiotics at dialysis can be performed and will ask infectious disease for recommendations once wound culture and sensitivities have been finalized.  Suspect MRSA at this time given patient has positive MRSA on nasal culture.  I also discussed if this progresses and becomes worse we can always consider surgical intervention and outpatient basis.  We discussed the possibility of the fifth toe amputation.    Elesa Hacker, DPM Cell [phone]   04/22/2017 4:33 PM

## 2017-04-22 NOTE — Progress Notes (Signed)
[facility] [place] NAME: Patrick Downs    MR#:  [id number]  DATE OF BIRTH:  02-02-1976  SUBJECTIVE:  CHIEF COMPLAINT:   Chief Complaint  Patient presents with  . Wound Infection     Came with foot infection, missed HD. No complains.  REVIEW OF SYSTEMS:  CONSTITUTIONAL: No fever, fatigue or weakness.  EYES: No blurred or double vision.  EARS, NOSE, AND THROAT: No tinnitus or ear pain.  RESPIRATORY: No cough, shortness of breath, wheezing or hemoptysis.  CARDIOVASCULAR: No chest pain, orthopnea, edema.  GASTROINTESTINAL: No nausea, vomiting, diarrhea or abdominal pain.  GENITOURINARY: No dysuria, hematuria.  ENDOCRINE: No polyuria, nocturia,  HEMATOLOGY: No anemia, easy bruising or bleeding SKIN: No rash or lesion. MUSCULOSKELETAL: No joint pain or arthritis.   NEUROLOGIC: No tingling, numbness, weakness.  PSYCHIATRY: No anxiety or depression.   ROS  DRUG ALLERGIES:   Allergies  Allergen Reactions  . Ivp Dye [Iodinated Diagnostic Agents] Other (See Comments)    Reaction unknown  . Other     Other reaction(s): Unknown Reaction unknown  . Minoxidil Other (See Comments)    "put fluid around my heart" "put fluid around my heart" PERICARDIAL EFFUSION Reaction unknown  . Morphine Other (See Comments)    Other reaction(s): Unknown Reaction unknown Makes patient aggressive  . Morphine And Related Other (See Comments)    Reaction unknown  . Omnipaque [Iohexol] Other (See Comments) and Itching    Rigors on one occasion, widespread itching on a separate occasion (resolved with Benadryl). Reaction unknown Reaction unknown tremors Rigors on one occasion, widespread itching on a separate occasion (resolved with Benadryl). Reaction unknown    VITALS:  Blood pressure 114/73, pulse 91, temperature 98.5 F (36.9 C), temperature source Oral, resp. rate 17, height 6\' 2"  (1.88 m), weight 113.2 kg (249 lb 9.6 oz), SpO2 (!) 60  %.  PHYSICAL EXAMINATION:  GENERAL:  41 y.o.-year-old patient lying in the bed with no acute distress.  EYES: Pupils equal, round, reactive to light and accommodation. No scleral icterus. Extraocular muscles intact.  HEENT: Head atraumatic, normocephalic. Oropharynx and nasopharynx clear.  NECK:  Supple, no jugular venous distention. No thyroid enlargement, no tenderness.  LUNGS: Normal breath sounds bilaterally, no wheezing, rales,rhonchi or crepitation. No use of accessory muscles of respiration.  CARDIOVASCULAR: S1, S2 normal. No murmurs, rubs, or gallops.  ABDOMEN: Soft, nontender, nondistended. Bowel sounds present. No organomegaly or mass.  EXTREMITIES: No pedal edema, cyanosis, or clubbing. Right foot on 5th toe lateral side- 2 cm ulcer with slough at base. NEUROLOGIC: Cranial nerves II through XII are intact. Muscle strength 5/5 in all extremities. Distal muscles on both hands and foot are week and atrophic- as per him since GB syndrome since 2003. Sensation lost in distal leg and foot too, chronic. Gait not checked.  PSYCHIATRIC: The patient is alert and oriented x 3.  SKIN: No obvious rash, lesion, or ulcer.   Physical Exam LABORATORY PANEL:   CBC Recent Labs  Lab 04/22/17 0413  WBC 10.2  HGB 11.8*  HCT 36.1*  PLT 133*   ------------------------------------------------------------------------------------------------------------------  Chemistries  Recent Labs  Lab 04/21/17 1557  NA 137  K 5.7*  CL 99*  CO2 24  GLUCOSE 141*  BUN 54*  CREATININE 15.04*  CALCIUM 8.1*  AST 23  ALT 7*  ALKPHOS 43  BILITOT 0.9   ------------------------------------------------------------------------------------------------------------------  Cardiac Enzymes No results for input(s): TROPONINI in the last 168 hours. ------------------------------------------------------------------------------------------------------------------  RADIOLOGY:  Dg Chest 2 View  Result Date:  04/21/2017 CLINICAL DATA:  Fever, sepsis, dialysis patient EXAM: CHEST  2 VIEW COMPARISON:  11/26/2014 chest radiograph. FINDINGS: Left internal jugular central venous catheter terminates at the cavoatrial junction. Vascular stent overlies the left axillary region. Stable cardiomediastinal silhouette with normal heart size. No pneumothorax. No pleural effusion. Lungs appear clear, with no acute consolidative airspace disease and no pulmonary edema. IMPRESSION: No active cardiopulmonary disease. Electronically Signed   By: Ilona Sorrel M.D.   On: 04/21/2017 18:22   Dg Foot Complete Right  Result Date: 04/21/2017 CLINICAL DATA:  41 year old male with nonhealing wound in the right fifth toe. EXAM: RIGHT FOOT COMPLETE - 3+ VIEW COMPARISON:  None. FINDINGS: There is no acute fracture or dislocation. No bone erosion or periosteal reaction. There diffuse soft tissue swelling of the foot primarily over the dorsum of the foot. A small skin defect noted over the lateral aspect of the fifth toe. No soft tissue gas or radiopaque foreign object. Vascular calcifications noted. IMPRESSION: 1. No acute osseous pathology. No evidence of osteomyelitis by radiograph. 2. Diffuse soft tissue swelling of the foot and small skin defect/ulcer over the lateral aspect of the fifth digit. No soft tissue gas. 3. Vascular calcification. Electronically Signed   By: Anner Crete M.D.   On: 04/21/2017 18:24    ASSESSMENT AND PLAN:   Active Problems:   Sepsis ([place])  1 acute sepsis Secondary to #2  empiric vancomycin/Zosyn, follow-up on cultures  2 acute infected right foot wound with associated cellulitis  wound cultures, empiric vancomycin/Zosyn, consult podiatry for expert, follow-up on cultures, and continue close medical monitoring  Xray does not show osteomyelitis.  3 chronic end-stage renal disease on hemodialysis Missed hemodialysis on saturday Consult nephrology for hemodialysis needs  4 acute  hyperkalemia Secondary to end-stage renal disease/missed hemodialysis Kayexalate x1, placed on telemetry bed,      All the records are reviewed and case discussed with Care Management/Social Workerr. Management plans discussed with the patient, family and they are in agreement.  CODE STATUS: Full.  TOTAL TIME TAKING CARE OF THIS PATIENT: 35 minutes.     POSSIBLE D/C IN 1-2 DAYS, DEPENDING ON CLINICAL CONDITION.   Vaughan Basta M.D on 04/22/2017   Between 7am to 6pm - Pager - [phone]  After 6pm go to [email or web address] - password [email or web address] [facility]  Office  [phone]  CC: Primary care physician; Leonel Ramsay, MD  Note: This dictation was prepared with Dragon dictation along with smaller phrase technology. Any transcriptional errors that result from this process are unintentional.

## 2017-04-23 MED ORDER — VANCOMYCIN HCL IN DEXTROSE 1-5 GM/200ML-% IV SOLN
1000.0000 mg | Freq: Once | INTRAVENOUS | Status: AC
  Administered 2017-04-23: 1000 mg via INTRAVENOUS
  Filled 2017-04-23 (×2): qty 200

## 2017-04-23 MED ORDER — EPOETIN ALFA 10000 UNIT/ML IJ SOLN: 4000.0000 [IU] | INTRAMUSCULAR | Status: DC

## 2017-04-23 MED ORDER — CALCIUM ACETATE (PHOS BINDER) 667 MG PO CAPS
1334.0000 mg | ORAL_CAPSULE | Freq: Once | ORAL | Status: AC
  Administered 2017-04-23: 1334 mg via ORAL
  Filled 2017-04-23: qty 2

## 2017-04-23 MED ORDER — DIPHENHYDRAMINE HCL 25 MG PO CAPS
25.0000 mg | ORAL_CAPSULE | Freq: Four times a day (QID) | ORAL | Status: DC | PRN
  Administered 2017-04-23: 25 mg via ORAL
  Filled 2017-04-23: qty 1

## 2017-04-23 NOTE — Progress Notes (Signed)
[facility] [place], [place] 04/23/17  Subjective:   Patient seen during dialysis Tolerating well    HEMODIALYSIS FLOWSHEET:  Blood Flow Rate (mL/min): 400 mL/min Arterial Pressure (mmHg): -210 mmHg Venous Pressure (mmHg): 200 mmHg Transmembrane Pressure (mmHg): 60 mmHg Ultrafiltration Rate (mL/min): 670 mL/min Dialysate Flow Rate (mL/min): 800 ml/min Conductivity: Machine : 13.9 Conductivity: Machine : 13.9 Dialysis Fluid Bolus: Normal Saline Bolus Amount (mL): 250 mL  Patient has been evaluated by podiatrist.  Wound cultures are pending.  He is currently getting IV antibiotics.  Otherwise able to eat and drink without any nausea or vomiting.   Objective:  Vital signs in last 24 hours:  Temp:  [98.5 F (36.9 C)-99 F (37.2 C)] 99 F (37.2 C) (12/31 0945) Pulse Rate:  [76-95] 80 (12/31 1200) Resp:  [4-21] 9 (12/31 1200) BP: (90-119)/(58-89) 92/64 (12/31 1200) SpO2:  [92 %-100 %] 96 % (12/31 1200) Weight:  [116.3 kg (256 lb 4.8 oz)-117.5 kg (259 lb 0.7 oz)] 117.5 kg (259 lb 0.7 oz) (12/31 0945)  Weight change: 2.858 kg (6 lb 4.8 oz) Filed Weights   04/21/17 2111 04/23/17 0434 04/23/17 0945  Weight: 113.2 kg (249 lb 9.6 oz) 116.3 kg (256 lb 4.8 oz) 117.5 kg (259 lb 0.7 oz)    Intake/Output:    Intake/Output Summary (Last 24 hours) at 04/23/2017 1233 Last data filed at 04/23/2017 3086 Gross per 24 hour  Intake 0 ml  Output 0 ml  Net 0 ml     Physical Exam: General:  Sitting up in the bed, no acute distress  HEENT  anicteric, moist oral mucous membranes  Neck  supple  Pulm/lungs  normal breathing effort, clear to auscultation  CVS/Heart  regular rhythm, no rub or gallop  Abdomen:   Soft, nontender  Extremities:  Right lower extremity edema,   Neurologic:  Alert, oriented  Skin:  Redness over the right lower extremity, right foot bandage  Access:  Left IJ PermCath       Basic Metabolic Panel:  Recent Labs  Lab 04/21/17 1557  NA  137  K 5.7*  CL 99*  CO2 24  GLUCOSE 141*  BUN 54*  CREATININE 15.04*  CALCIUM 8.1*     CBC: Recent Labs  Lab 04/21/17 1557 04/22/17 0413  WBC 11.9* 10.2  NEUTROABS 9.5*  --   HGB 10.4* 11.8*  HCT 32.6* 36.1*  MCV 91.7 91.5  PLT 151 133*      Lab Results  Component Value Date   HEPBSAG Negative 11/27/2014   HEPBSAB Reactive 09/28/2014   HEPBIGM NON REACTIVE 08/29/2014      Microbiology:  Recent Results (from the past 240 hour(s))  Aerobic Culture (superficial specimen)     Status: None (Preliminary result)   Collection Time: 04/21/17  4:45 PM  Result Value Ref Range Status   Specimen Description   Final    FOOT RIGHT Performed at [facility], [address], [place], [place] [address]    Special Requests   Final    NONE Performed at [facility], [address] [place], [place] [address]    Gram Stain   Final    FEW WBC PRESENT, PREDOMINANTLY PMN FEW GRAM POSITIVE COCCI IN CLUSTERS Performed at [facility], [place] [address] [place], [place] [address]    Culture PENDING  Incomplete   Report Status PENDING  Incomplete  MRSA PCR Screening     Status: Abnormal   Collection Time: 04/22/17  7:35 AM  Result Value Ref Range Status   MRSA by PCR POSITIVE (A) NEGATIVE Final    Comment:        The GeneXpert MRSA Assay (FDA approved for NASAL specimens only), is one component of a comprehensive MRSA colonization surveillance program. It is not intended to diagnose MRSA infection nor to guide or monitor treatment for MRSA infections. RESULT CALLED TO, READ BACK BY AND VERIFIED WITH: Lum Babe ORNELAS AT 3846 ON 04/22/17 BY SNJ Performed at [facility], [address] [place], [place] [address]     Coagulation Studies: No results for input(s): LABPROT, INR in the last 72 hours.  Urinalysis: No results for input(s): COLORURINE, LABSPEC, PHURINE, GLUCOSEU, HGBUR, BILIRUBINUR, KETONESUR, PROTEINUR,  UROBILINOGEN, NITRITE, LEUKOCYTESUR in the last 72 hours.  Invalid input(s): APPERANCEUR    Imaging: Dg Chest 2 View  Result Date: 04/21/2017 CLINICAL DATA:  Fever, sepsis, dialysis patient EXAM: CHEST  2 VIEW COMPARISON:  11/26/2014 chest radiograph. FINDINGS: Left internal jugular central venous catheter terminates at the cavoatrial junction. Vascular stent overlies the left axillary region. Stable cardiomediastinal silhouette with normal heart size. No pneumothorax. No pleural effusion. Lungs appear clear, with no acute consolidative airspace disease and no pulmonary edema. IMPRESSION: No active cardiopulmonary disease. Electronically Signed   By: Ilona Sorrel M.D.   On: 04/21/2017 18:22   Dg Foot Complete Right  Result Date: 04/21/2017 CLINICAL DATA:  41 year old male with nonhealing wound in the right fifth toe. EXAM: RIGHT FOOT COMPLETE - 3+ VIEW COMPARISON:  None. FINDINGS: There is no acute fracture or dislocation. No bone erosion or periosteal reaction. There diffuse soft tissue swelling of the foot primarily over the dorsum of the foot. A small skin defect noted over the lateral aspect of the fifth toe. No soft tissue gas or radiopaque foreign object. Vascular calcifications noted. IMPRESSION: 1. No acute osseous pathology. No evidence of osteomyelitis by radiograph. 2. Diffuse soft tissue swelling of the foot and small skin defect/ulcer over the lateral aspect of the fifth digit. No soft tissue gas. 3. Vascular calcification. Electronically Signed   By: Anner Crete M.D.   On: 04/21/2017 18:24     Medications:   . sodium chloride    . piperacillin-tazobactam (ZOSYN)  IV 3.375 g (04/23/17 0920)  . [START ON 04/24/2017] vancomycin    . vancomycin     . oxyCODONE  10 mg Oral Q6H   And  . acetaminophen  325 mg Oral Q6H  . calcium acetate  2,001 mg Oral TID WC  . Chlorhexidine Gluconate Cloth  6 each Topical Q0600  . gabapentin  600 mg Oral QHS  . heparin  5,000 Units  Subcutaneous Q8H  . midodrine  5 mg Oral TID WC  . multivitamin with minerals  1 tablet Oral Daily  . mupirocin ointment  1 application Nasal BID  . sodium chloride flush  3 mL Intravenous Q12H   sodium chloride, acetaminophen **OR** acetaminophen, diphenhydrAMINE, ondansetron **OR** ondansetron (ZOFRAN) IV, sodium chloride flush  Assessment/ Plan:  41 y.o. African-American male with end-stage renal disease, hypertension, history of Ethelene Hal syndrome presents for evaluation of large, draining wound to right little toe.  He is diagnosed with acute infection of right foot wound with associated cellulitis and is admitted for IV antibiotics  [facility] MWF/ 4.5 hrs/ 110.5 kg  1.  End-stage renal disease 2.  Hyperkalemia 3.  Anemia of chronic kidney disease, hemoglobin 11.8 (12/30) 4.  Secondary hyperparathyroidism 5.  Right foot  cellulitis  Patient seen during dialysis.  Tolerating well.  Treatment extended to 4-1/2 hours which is his normal time.  Patient appears to be 6 kg above his dry weight (based on bed weight).  Ultrafiltration increased to 4-4.5 kg as tolerated.  Low potassium bath for hyperkalemia.  IV antibiotics per hospitalist team.  Please consult with pharmacy for dose adjustment for ESRD.  Low-dose Procrit with dialysis.  Monitor phosphorus during hospital admission.   LOS: 2 Patrick Downs 12/31/201812:33 PM  [facility] [place], [place]

## 2017-04-23 NOTE — Consult Note (Signed)
[facility] Infectious Disease     Reason for Consult: R foot wound, sepsis  Referring Physician: Loney Hering Date of Admission:  04/21/2017   Active Problems:   Sepsis [facility])  HPI: Patrick Downs is a 41 y.o. male admitted with R foot wound, redness and fevers. He was found to have R 5th toe wound and cellulitis.   Has seen podiatry and had neg XRAY for osteomyelitis. Culture pending. He has ESRD and is on HD. WBC on admit 12, ESR 75. Reports the wound opened up since he has been on his feet more working at [employer]. He has to hose down floor and his shoes get wet. He however changes shoes as soon as he gets home.  Past Medical History:  Diagnosis Date  . Depression   . Dialysis patient ([place])   . ESRD (end stage renal disease) on dialysis ([place])   . Failure to thrive in adult   . Gout   . Guillain Barr syndrome ([place])   . HTN (hypertension)   . Kidney failure   . Pneumonia   . Renal insufficiency   . Respiratory failure [facility])    Past Surgical History:  Procedure Laterality Date  . AV FISTULA PLACEMENT     x5      2 graphs  . PARATHYROIDECTOMY    . RENAL BIOPSY    . tonsiilectomy    . tracheotomy     Social History   Tobacco Use  . Smoking status: Never Smoker  . Smokeless tobacco: Never Used  Substance Use Topics  . Alcohol use: No  . Drug use: Yes    Types: Marijuana   Family History  Problem Relation Age of Onset  . Diabetes Mellitus II Father   . Kidney disease Father   . Kidney failure Paternal Grandfather   . Prostate cancer Neg Hx   . Kidney cancer Neg Hx   . Bladder Cancer Neg Hx     Allergies:  Allergies  Allergen Reactions  . Ivp Dye [Iodinated Diagnostic Agents] Other (See Comments)    Reaction unknown  . Other     Other reaction(s): Unknown Reaction unknown  . Minoxidil Other (See Comments)    "put fluid around my heart" "put fluid around my heart" PERICARDIAL EFFUSION Reaction unknown  . Morphine Other (See Comments)     Other reaction(s): Unknown Reaction unknown Makes patient aggressive  . Morphine And Related Other (See Comments)    Reaction unknown  . Omnipaque [Iohexol] Other (See Comments) and Itching    Rigors on one occasion, widespread itching on a separate occasion (resolved with Benadryl). Reaction unknown Reaction unknown tremors Rigors on one occasion, widespread itching on a separate occasion (resolved with Benadryl). Reaction unknown    Current antibiotics: Antibiotics Given (last 72 hours)    Date/Time Action Medication Dose Rate   04/21/17 1704 New Bag/Given   piperacillin-tazobactam (ZOSYN) IVPB 3.375 g 3.375 g 100 mL/hr   04/21/17 1801 New Bag/Given   vancomycin (VANCOCIN) IVPB 1000 mg/200 mL premix 1,000 mg 200 mL/hr   04/21/17 2314 New Bag/Given   vancomycin (VANCOCIN) IVPB 1000 mg/200 mL premix 1,000 mg 200 mL/hr   04/22/17 0815 New Bag/Given   piperacillin-tazobactam (ZOSYN) IVPB 3.375 g 3.375 g 12.5 mL/hr   04/22/17 2021 New Bag/Given   piperacillin-tazobactam (ZOSYN) IVPB 3.375 g 3.375 g 12.5 mL/hr   04/23/17 0920 New Bag/Given   piperacillin-tazobactam (ZOSYN) IVPB 3.375 g 3.375 g 12.5 mL/hr   04/23/17 1341 New Bag/Given  vancomycin (VANCOCIN) IVPB 1000 mg/200 mL premix 1,000 mg 200 mL/hr      MEDICATIONS: . oxyCODONE  10 mg Oral Q6H   And  . acetaminophen  325 mg Oral Q6H  . calcium acetate  2,001 mg Oral TID WC  . Chlorhexidine Gluconate Cloth  6 each Topical Q0600  . [START ON 04/25/2017] epoetin (EPOGEN/PROCRIT) injection  4,000 Units Intravenous Q M,W,F-HD  . gabapentin  600 mg Oral QHS  . heparin  5,000 Units Subcutaneous Q8H  . midodrine  5 mg Oral TID WC  . multivitamin with minerals  1 tablet Oral Daily  . mupirocin ointment  1 application Nasal BID  . sodium chloride flush  3 mL Intravenous Q12H    Review of Systems - 11 systems reviewed and negative per HPI   OBJECTIVE: Temp:  [98.5 F (36.9 C)-99 F (37.2 C)] 99 F (37.2 C) (12/31  0945) Pulse Rate:  [76-95] 76 (12/31 1345) Resp:  [4-21] 15 (12/31 1345) BP: (68-121)/(57-98) 90/64 (12/31 1345) SpO2:  [92 %-100 %] 93 % (12/31 1345) Weight:  [116.3 kg (256 lb 4.8 oz)-117.5 kg (259 lb 0.7 oz)] 117.5 kg (259 lb 0.7 oz) (12/31 0945) Physical Exam  Constitutional: He is oriented to person, place, and time. He appears well-developed and well-nourished. No distress.  HENT:  Mouth/Throat: Oropharynx is clear and moist. No oropharyngeal exudate.  Cardiovascular: Normal rate, regular rhythm and normal heart sounds. Pulmonary/Chest: Effort normal and breath sounds normal. No respiratory distress. He has no wheezes.  Abdominal: Soft. Bowel sounds are normal. He exhibits no distension. There is no tenderness.  Lymphadenopathy: He has no cervical adenopathy.  Neurological: He is alert and oriented to person, place, and time. bil LE weakness Ext R leg somewhat swollen and warm.  Skin:R 5th toe with an ulcer laterally but no exposed bone.  Psychiatric: He has a normal mood and affect. His behavior is normal.     LABS: Results for orders placed or performed during the hospital encounter of 04/21/17 (from the past 48 hour(s))  Lactic acid, plasma     Status: None   Collection Time: 04/21/17  3:56 PM  Result Value Ref Range   Lactic Acid, Venous 1.6 0.5 - 1.9 mmol/L    Comment: Performed at [facility], [address] [place], [place] [address]  Comprehensive metabolic panel     Status: Abnormal   Collection Time: 04/21/17  3:57 PM  Result Value Ref Range   Sodium 137 135 - 145 mmol/L   Potassium 5.7 (H) 3.5 - 5.1 mmol/L   Chloride 99 (L) 101 - 111 mmol/L   CO2 24 22 - 32 mmol/L   Glucose, Bld 141 (H) 65 - 99 mg/dL   BUN 54 (H) 6 - 20 mg/dL   Creatinine, Ser 15.04 (H) 0.61 - 1.24 mg/dL   Calcium 8.1 (L) 8.9 - 10.3 mg/dL   Total Protein 7.2 6.5 - 8.1 g/dL   Albumin 3.3 (L) 3.5 - 5.0 g/dL   AST 23 15 - 41 U/L   ALT 7 (L) 17 - 63 U/L   Alkaline Phosphatase 43 38  - 126 U/L   Total Bilirubin 0.9 0.3 - 1.2 mg/dL   GFR calc non Af Amer 3 (L) >60 mL/min   GFR calc Af Amer 4 (L) >60 mL/min    Comment: (NOTE) The eGFR has been calculated using the CKD EPI equation. This calculation has not been validated in all clinical situations. eGFR's persistently <60 mL/min signify possible  Chronic Kidney Disease.    Anion gap 14 5 - 15    Comment: Performed at [facility], [address] [place], [place] [address]  CBC with Differential     Status: Abnormal   Collection Time: 04/21/17  3:57 PM  Result Value Ref Range   WBC 11.9 (H) 3.8 - 10.6 K/uL   RBC 3.55 (L) 4.40 - 5.90 MIL/uL   Hemoglobin 10.4 (L) 13.0 - 18.0 g/dL   HCT 32.6 (L) 40.0 - 52.0 %   MCV 91.7 80.0 - 100.0 fL   MCH 29.4 26.0 - 34.0 pg   MCHC 32.0 32.0 - 36.0 g/dL   RDW 15.7 (H) 11.5 - 14.5 %   Platelets 151 150 - 440 K/uL   Neutrophils Relative % 79 %   Neutro Abs 9.5 (H) 1.4 - 6.5 K/uL   Lymphocytes Relative 8 %   Lymphs Abs 1.0 1.0 - 3.6 K/uL   Monocytes Relative 10 %   Monocytes Absolute 1.2 (H) 0.2 - 1.0 K/uL   Eosinophils Relative 2 %   Eosinophils Absolute 0.2 0 - 0.7 K/uL   Basophils Relative 1 %   Basophils Absolute 0.1 0 - 0.1 K/uL    Comment: Performed at [facility], [address] [place], [place] [address]  Sedimentation rate     Status: Abnormal   Collection Time: 04/21/17  3:57 PM  Result Value Ref Range   Sed Rate 75 (H) 0 - 15 mm/hr    Comment: Performed at [facility], [address] [place], [place] [address]  Aerobic Culture (superficial specimen)     Status: None (Preliminary result)   Collection Time: 04/21/17  4:45 PM  Result Value Ref Range   Specimen Description      FOOT RIGHT Performed at [facility], [address], [place], [place] [address]    Special Requests      NONE Performed at [facility], [address] [place], [place] [address]    Gram Stain      FEW WBC PRESENT, PREDOMINANTLY  PMN FEW GRAM POSITIVE COCCI IN CLUSTERS Performed at [facility], [address] [place], [place] [address]    Culture PENDING    Report Status PENDING   CBC     Status: Abnormal   Collection Time: 04/22/17  4:13 AM  Result Value Ref Range   WBC 10.2 3.8 - 10.6 K/uL   RBC 3.95 (L) 4.40 - 5.90 MIL/uL   Hemoglobin 11.8 (L) 13.0 - 18.0 g/dL   HCT 36.1 (L) 40.0 - 52.0 %   MCV 91.5 80.0 - 100.0 fL   MCH 29.8 26.0 - 34.0 pg   MCHC 32.5 32.0 - 36.0 g/dL   RDW 16.0 (H) 11.5 - 14.5 %   Platelets 133 (L) 150 - 440 K/uL    Comment: Performed at [facility], [address] [place],  [address]  MRSA PCR Screening     Status: Abnormal   Collection Time: 04/22/17  7:35 AM  Result Value Ref Range   MRSA by PCR POSITIVE (A) NEGATIVE    Comment:        The GeneXpert MRSA Assay (FDA approved for NASAL specimens only), is one component of a comprehensive MRSA colonization surveillance program. It is not intended to diagnose MRSA infection nor to guide or monitor treatment for MRSA infections. RESULT CALLED TO, READ BACK BY AND VERIFIED WITH: JUAN RODRIGUEZ ORNELAS AT 4680 ON 04/22/17 BY [facility]  Performed at [facility], [address] [place], [place] [address]    No components found for: ESR, C REACTIVE PROTEIN MICRO: Recent Results (from the past 720 hour(s))  Aerobic Culture (superficial specimen)     Status: None (Preliminary result)   Collection Time: 04/21/17  4:45 PM  Result Value Ref Range Status   Specimen Description   Final    FOOT RIGHT Performed at [facility], [address], [place], [place] [address]    Special Requests   Final    NONE Performed at [facility], [address] [place], [place] [address]    Gram Stain   Final    FEW WBC PRESENT, PREDOMINANTLY PMN FEW GRAM POSITIVE COCCI IN CLUSTERS Performed at [facility], [place] [address] [place], [place] [address]    Culture PENDING  Incomplete    Report Status PENDING  Incomplete  MRSA PCR Screening     Status: Abnormal   Collection Time: 04/22/17  7:35 AM  Result Value Ref Range Status   MRSA by PCR POSITIVE (A) NEGATIVE Final    Comment:        The GeneXpert MRSA Assay (FDA approved for NASAL specimens only), is one component of a comprehensive MRSA colonization surveillance program. It is not intended to diagnose MRSA infection nor to guide or monitor treatment for MRSA infections. RESULT CALLED TO, READ BACK BY AND VERIFIED WITH: Lum Babe ORNELAS AT 9038 ON 04/22/17 BY [facility] Performed at [facility], [address] [place], [place] [address]     IMAGING: Dg Chest 2 View  Result Date: 04/21/2017 CLINICAL DATA:  Fever, sepsis, dialysis patient EXAM: CHEST  2 VIEW COMPARISON:  11/26/2014 chest radiograph. FINDINGS: Left internal jugular central venous catheter terminates at the cavoatrial junction. Vascular stent overlies the left axillary region. Stable cardiomediastinal silhouette with normal heart size. No pneumothorax. No pleural effusion. Lungs appear clear, with no acute consolidative airspace disease and no pulmonary edema. IMPRESSION: No active cardiopulmonary disease. Electronically Signed   By: Ilona Sorrel M.D.   On: 04/21/2017 18:22   Dg Foot Complete Right  Result Date: 04/21/2017 CLINICAL DATA:  41 year old male with nonhealing wound in the right fifth toe. EXAM: RIGHT FOOT COMPLETE - 3+ VIEW COMPARISON:  None. FINDINGS: There is no acute fracture or dislocation. No bone erosion or periosteal reaction. There diffuse soft tissue swelling of the foot primarily over the dorsum of the foot. A small skin defect noted over the lateral aspect of the fifth toe. No soft tissue gas or radiopaque foreign object. Vascular calcifications noted. IMPRESSION: 1. No acute osseous pathology. No evidence of osteomyelitis by radiograph. 2. Diffuse soft tissue swelling of the foot and small skin defect/ulcer over the  lateral aspect of the fifth digit. No soft tissue gas. 3. Vascular calcification. Electronically Signed   By: Anner Crete M.D.   On: 04/21/2017 18:24    Assessment:   BRICEN VICTORY is a 41 y.o. male with R foot ulcer on lateral side of 5th toe in patient with ESRD, PN and previous Guillain Barre Syndrome.  There is no evidence of Xray of osteomyelitis, no exposed bone. Culture is pending but GS with GPC in clusters and MRSA PCR +. Has been on vanco and zosyn since admit.   Recommendations At this point would rec 2 weeks IV abx at HD.  If ready for DC tomorrow would send on Vancomycin to be given at HD  3x a week  for 2 weeks.  Will fu on culture results and if no MRSA can change to HD dosed ancef. Would also cover for polymicrobial infection with oral augmentin 500 mg Q 24 also for 2 weeks. Will need attention to wound care, footwear and weight bearing status per podiatry recommendations. I can see in 2 weeks time prior to dc of abx. Thank you very much for allowing me to participate in the care of this patient. Please call with questions.   Cheral Marker. Ola Spurr, MD

## 2017-04-23 NOTE — Care Management Important Message (Signed)
Important Message  Patient Details  Name: Patrick Downs MRN: [id number] Date of Birth: Mar 30, 1976   Medicare Important Message Given:  Yes    Beverly Sessions, RN 04/23/2017, 3:28 PM

## 2017-04-23 NOTE — Care Management (Signed)
Amanda Morris HD liaison notified of admission.  

## 2017-04-23 NOTE — Progress Notes (Signed)
This note also relates to the following rows which could not be included: SpO2 - Cannot attach notes to unvalidated device data  Post HD

## 2017-04-23 NOTE — Progress Notes (Signed)
Notified Md of pt request for benadryl for itching, orders taken

## 2017-04-23 NOTE — Progress Notes (Signed)
[place] [facility] [place] NAME: Patrick Downs    MR#:  [id number]  DATE OF BIRTH:  [birth year]  SUBJECTIVE:  CHIEF COMPLAINT:   Chief Complaint  Patient presents with  . Wound Infection     Came with foot infection, missed HD. No complains.     REVIEW OF SYSTEMS:  CONSTITUTIONAL: No fever, fatigue or weakness.  EYES: No blurred or double vision.  EARS, NOSE, AND THROAT: No tinnitus or ear pain.  RESPIRATORY: No cough, shortness of breath, wheezing or hemoptysis.  CARDIOVASCULAR: No chest pain, orthopnea, edema.  GASTROINTESTINAL: No nausea, vomiting, diarrhea or abdominal pain.  GENITOURINARY: No dysuria, hematuria.  ENDOCRINE: No polyuria, nocturia,  HEMATOLOGY: No anemia, easy bruising or bleeding SKIN: No rash or lesion. MUSCULOSKELETAL: No joint pain or arthritis.   NEUROLOGIC: No tingling, numbness, weakness.  PSYCHIATRY: No anxiety or depression.   ROS  DRUG ALLERGIES:   Allergies  Allergen Reactions  . Ivp Dye [Iodinated Diagnostic Agents] Other (See Comments)    Reaction unknown  . Other     Other reaction(s): Unknown Reaction unknown  . Minoxidil Other (See Comments)    "put fluid around my heart" "put fluid around my heart" PERICARDIAL EFFUSION Reaction unknown  . Morphine Other (See Comments)    Other reaction(s): Unknown Reaction unknown Makes patient aggressive  . Morphine And Related Other (See Comments)    Reaction unknown  . Omnipaque [Iohexol] Other (See Comments) and Itching    Rigors on one occasion, widespread itching on a separate occasion (resolved with Benadryl). Reaction unknown Reaction unknown tremors Rigors on one occasion, widespread itching on a separate occasion (resolved with Benadryl). Reaction unknown    VITALS:  Blood pressure (!) 93/53, pulse 74, temperature 98.3 F (36.8 C), temperature source Oral, resp. rate 12, height 6\' 2"  (1.88 m), weight 117.5 kg (259 lb 0.7 oz), SpO2 100  %.  PHYSICAL EXAMINATION:  GENERAL:  41 y.o.-year-old patient lying in the bed with no acute distress.  EYES: Pupils equal, round, reactive to light and accommodation. No scleral icterus. Extraocular muscles intact.  HEENT: Head atraumatic, normocephalic. Oropharynx and nasopharynx clear.  NECK:  Supple, no jugular venous distention. No thyroid enlargement, no tenderness.  LUNGS: Normal breath sounds bilaterally, no wheezing, rales,rhonchi or crepitation. No use of accessory muscles of respiration.  CARDIOVASCULAR: S1, S2 normal. No murmurs, rubs, or gallops.  ABDOMEN: Soft, nontender, nondistended. Bowel sounds present. No organomegaly or mass.  EXTREMITIES: No pedal edema, cyanosis, or clubbing. Right foot on 5th toe lateral side- 2 cm ulcer with slough at base. NEUROLOGIC: Cranial nerves II through XII are intact. Muscle strength 5/5 in all extremities. Distal muscles on both hands and foot are week and atrophic- as per him since GB syndrome since 2003. Sensation lost in distal leg and foot too, chronic. Gait not checked.  PSYCHIATRIC: The patient is alert and oriented x 3.  SKIN: No obvious rash, lesion, or ulcer.   Physical Exam LABORATORY PANEL:   CBC Recent Labs  Lab 04/22/17 0413  WBC 10.2  HGB 11.8*  HCT 36.1*  PLT 133*   ------------------------------------------------------------------------------------------------------------------  Chemistries  Recent Labs  Lab 04/21/17 1557  NA 137  K 5.7*  CL 99*  CO2 24  GLUCOSE 141*  BUN 54*  CREATININE 15.04*  CALCIUM 8.1*  AST 23  ALT 7*  ALKPHOS 43  BILITOT 0.9   ------------------------------------------------------------------------------------------------------------------  Cardiac Enzymes No results for input(s): TROPONINI in the last 168 hours. ------------------------------------------------------------------------------------------------------------------  RADIOLOGY:  Dg Chest 2 View  Result Date:  04/21/2017 CLINICAL DATA:  Fever, sepsis, dialysis patient EXAM: CHEST  2 VIEW COMPARISON:  11/26/2014 chest radiograph. FINDINGS: Left internal jugular central venous catheter terminates at the cavoatrial junction. Vascular stent overlies the left axillary region. Stable cardiomediastinal silhouette with normal heart size. No pneumothorax. No pleural effusion. Lungs appear clear, with no acute consolidative airspace disease and no pulmonary edema. IMPRESSION: No active cardiopulmonary disease. Electronically Signed   By: Ilona Sorrel M.D.   On: 04/21/2017 18:22   Dg Foot Complete Right  Result Date: 04/21/2017 CLINICAL DATA:  41 year old male with nonhealing wound in the right fifth toe. EXAM: RIGHT FOOT COMPLETE - 3+ VIEW COMPARISON:  None. FINDINGS: There is no acute fracture or dislocation. No bone erosion or periosteal reaction. There diffuse soft tissue swelling of the foot primarily over the dorsum of the foot. A small skin defect noted over the lateral aspect of the fifth toe. No soft tissue gas or radiopaque foreign object. Vascular calcifications noted. IMPRESSION: 1. No acute osseous pathology. No evidence of osteomyelitis by radiograph. 2. Diffuse soft tissue swelling of the foot and small skin defect/ulcer over the lateral aspect of the fifth digit. No soft tissue gas. 3. Vascular calcification. Electronically Signed   By: Anner Crete M.D.   On: 04/21/2017 18:24    ASSESSMENT AND PLAN:   Active Problems:   Sepsis ([place])  1 acute sepsis Secondary to #2  empiric vancomycin/Zosyn, follow-up on cultures   Blood culture is not sent, wound culture was sent yesterday by podiatry.  2 acute infected right foot wound with associated cellulitis  wound cultures, empiric vancomycin/Zosyn, consult podiatry for expert, follow-up on cultures, and continue close medical monitoring  Xray does not show osteomyelitis.   Podiatry suggested no further surgical intervention but advised to have IV  antibiotics so I called infectious disease consult.  Culture from wound was sent to guide antibiotic therapy.  3 chronic end-stage renal disease on hemodialysis Missed hemodialysis on saturday Consult nephrology for hemodialysis needs  4 acute hyperkalemia Secondary to end-stage renal disease/missed hemodialysis Kayexalate x1, placed on telemetry bed,    All the records are reviewed and case discussed with Care Management/Social Workerr. Management plans discussed with the patient, family and they are in agreement.  CODE STATUS: Full.  TOTAL TIME TAKING CARE OF THIS PATIENT: 35 minutes.     POSSIBLE D/C IN 1-2 DAYS, DEPENDING ON CLINICAL CONDITION.   Vaughan Basta M.D on 04/23/2017   Between 7am to 6pm - Pager - [phone]  After 6pm go to [email or web address] - password [email or web address] [facility]  Office  [phone]  CC: Primary care physician; Leonel Ramsay, MD  Note: This dictation was prepared with Dragon dictation along with smaller phrase technology. Any transcriptional errors that result from this process are unintentional.

## 2017-04-23 NOTE — Progress Notes (Signed)
Pre HD  

## 2017-04-23 NOTE — Progress Notes (Signed)
HD Started 

## 2017-04-24 LAB — BASIC METABOLIC PANEL WITH GFR
Anion gap: 13 (ref 5–15)
BUN: 39 mg/dL — ABNORMAL HIGH (ref 6–20)
CO2: 29 mmol/L (ref 22–32)
Calcium: 7.8 mg/dL — ABNORMAL LOW (ref 8.9–10.3)
Chloride: 97 mmol/L — ABNORMAL LOW (ref 101–111)
Creatinine, Ser: 11.41 mg/dL — ABNORMAL HIGH (ref 0.61–1.24)
GFR calc Af Amer: 6 mL/min — ABNORMAL LOW
GFR calc non Af Amer: 5 mL/min — ABNORMAL LOW
Glucose, Bld: 111 mg/dL — ABNORMAL HIGH (ref 65–99)
Potassium: 4.4 mmol/L (ref 3.5–5.1)
Sodium: 139 mmol/L (ref 135–145)

## 2017-04-24 LAB — CBC
HCT: 30 % — ABNORMAL LOW (ref 40.0–52.0)
Hemoglobin: 9.9 g/dL — ABNORMAL LOW (ref 13.0–18.0)
MCH: 30.3 pg (ref 26.0–34.0)
MCHC: 33.1 g/dL (ref 32.0–36.0)
MCV: 91.5 fL (ref 80.0–100.0)
Platelets: 145 10*3/uL — ABNORMAL LOW (ref 150–440)
RBC: 3.28 MIL/uL — AB (ref 4.40–5.90)
RDW: 16 % — ABNORMAL HIGH (ref 11.5–14.5)
WBC: 6.1 10*3/uL (ref 3.8–10.6)

## 2017-04-24 MED ORDER — VANCOMYCIN HCL IN DEXTROSE 1-5 GM/200ML-% IV SOLN: 1000.0000 mg | INTRAVENOUS | 0 refills | Status: AC

## 2017-04-24 MED ORDER — AMOXICILLIN-POT CLAVULANATE 875-125 MG PO TABS: 1.0000 | ORAL_TABLET | Freq: Two times a day (BID) | ORAL | 0 refills | Status: AC

## 2017-04-24 NOTE — Progress Notes (Signed)
[facility], [place] 04/24/17  Subjective:   Patient is doing well today with no acute complaints.  2500 cc of fluid was removed with dialysis yesterday No complaints of shortness of breath.  Able to eat without nausea or vomiting  Objective:  Vital signs in last 24 hours:  Temp:  [98.2 F (36.8 C)-99.4 F (37.4 C)] 98.5 F (36.9 C) (01/01 0528) Pulse Rate:  [74-82] 82 (01/01 0355) Resp:  [12-16] 16 (01/01 0355) BP: (86-115)/(44-74) 115/74 (01/01 0355) SpO2:  [97 %-100 %] 97 % (01/01 0355) Weight:  [115.8 kg (255 lb 6.4 oz)] 115.8 kg (255 lb 6.4 oz) (01/01 0355)  Weight change: 1.243 kg (2 lb 11.9 oz) Filed Weights   04/23/17 0434 04/23/17 0945 04/24/17 0355  Weight: 116.3 kg (256 lb 4.8 oz) 117.5 kg (259 lb 0.7 oz) 115.8 kg (255 lb 6.4 oz)    Intake/Output:    Intake/Output Summary (Last 24 hours) at 04/24/2017 1433 Last data filed at 04/24/2017 0020 Gross per 24 hour  Intake 163 ml  Output 0 ml  Net 163 ml     Physical Exam: General:  Sitting up in the bed, no acute distress  HEENT  anicteric, moist oral mucous membranes  Neck  supple  Pulm/lungs  normal breathing effort, clear to auscultation  CVS/Heart  regular rhythm, no rub or gallop  Abdomen:   Soft, nontender  Extremities:  Right lower extremity edema,   Neurologic:  Alert, oriented  Skin:  right foot bandage  Access:  Left IJ PermCath       Basic Metabolic Panel:  Recent Labs  Lab 04/21/17 1557 04/24/17 0527  NA 137 139  K 5.7* 4.4  CL 99* 97*  CO2 24 29  GLUCOSE 141* 111*  BUN 54* 39*  CREATININE 15.04* 11.41*  CALCIUM 8.1* 7.8*     CBC: Recent Labs  Lab 04/21/17 1557 04/22/17 0413 04/24/17 0527  WBC 11.9* 10.2 6.1  NEUTROABS 9.5*  --   --   HGB 10.4* 11.8* 9.9*  HCT 32.6* 36.1* 30.0*  MCV 91.7 91.5 91.5  PLT 151 133* 145*      Lab Results  Component Value Date   HEPBSAG Negative 11/27/2014   HEPBSAB Reactive 09/28/2014   HEPBIGM NON REACTIVE  08/29/2014      Microbiology:  Recent Results (from the past 240 hour(s))  Aerobic Culture (superficial specimen)     Status: None (Preliminary result)   Collection Time: 04/21/17  4:45 PM  Result Value Ref Range Status   Specimen Description   Final    FOOT RIGHT Performed at [facility], [address] [place], [place] [address]    Special Requests   Final    NONE Performed at [facility], [address] [place], [place] [address]    Gram Stain   Final    FEW WBC PRESENT, PREDOMINANTLY PMN FEW GRAM POSITIVE COCCI IN CLUSTERS    Culture   Final    ABUNDANT STAPHYLOCOCCUS AUREUS SUSCEPTIBILITIES TO FOLLOW Performed at [facility], [place] [address] [place], [place] [address]    Report Status PENDING  Incomplete  MRSA PCR Screening     Status: Abnormal   Collection Time: 04/22/17  7:35 AM  Result Value Ref Range Status   MRSA by PCR POSITIVE (A) NEGATIVE Final    Comment:        The GeneXpert MRSA Assay (FDA approved for NASAL specimens only), is one component of a comprehensive  MRSA colonization surveillance program. It is not intended to diagnose MRSA infection nor to guide or monitor treatment for MRSA infections. RESULT CALLED TO, READ BACK BY AND VERIFIED WITH: Lum Babe ORNELAS AT 4492 ON 04/22/17 BY SNJ Performed at [facility], [address] [place], [place] [address]     Coagulation Studies: No results for input(s): LABPROT, INR in the last 72 hours.  Urinalysis: No results for input(s): COLORURINE, LABSPEC, PHURINE, GLUCOSEU, HGBUR, BILIRUBINUR, KETONESUR, PROTEINUR, UROBILINOGEN, NITRITE, LEUKOCYTESUR in the last 72 hours.  Invalid input(s): APPERANCEUR    Imaging: No results found.   Medications:   . sodium chloride    . piperacillin-tazobactam (ZOSYN)  IV 3.375 g (04/24/17 0856)  . vancomycin     . oxyCODONE  10 mg Oral Q6H   And  . acetaminophen  325 mg Oral Q6H  . calcium acetate   2,001 mg Oral TID WC  . Chlorhexidine Gluconate Cloth  6 each Topical Q0600  . [START ON 04/25/2017] epoetin (EPOGEN/PROCRIT) injection  4,000 Units Intravenous Q M,W,F-HD  . gabapentin  600 mg Oral QHS  . heparin  5,000 Units Subcutaneous Q8H  . midodrine  5 mg Oral TID WC  . multivitamin with minerals  1 tablet Oral Daily  . mupirocin ointment  1 application Nasal BID  . sodium chloride flush  3 mL Intravenous Q12H   sodium chloride, acetaminophen **OR** acetaminophen, diphenhydrAMINE, ondansetron **OR** ondansetron (ZOFRAN) IV, sodium chloride flush  Assessment/ Plan:  42 y.o. African-American male with end-stage renal disease, hypertension, history of Ethelene Hal syndrome presents for evaluation of large, draining wound to right little toe.  He is diagnosed with acute infection of right foot wound with associated cellulitis and is admitted for IV antibiotics  [facility] MWF/ 4.5 hrs/ 110.5 kg  1.  End-stage renal disease 2.  Hyperkalemia 3.  Anemia of chronic kidney disease, hemoglobin 11.8 (12/30) 4.  Secondary hyperparathyroidism 5.  Right foot cellulitis  Wound cultures are positive for MRSA therefore patient will be treated with IV vancomycin for 2 weeks.  Outpatient dialysis unit status post today for the holiday.  Patient to be provided with a prescription for vancomycin prior to discharge Patient to follow-up with podiatry as outpatient.   LOS: [place] 1/1/20192:33 PM  [place], [place]

## 2017-04-24 NOTE — Discharge Instructions (Signed)
Dry dressing on the wound and keep It covered and dry.

## 2017-04-24 NOTE — Progress Notes (Signed)
ID E note No fevers, cx with Staph aureus S pending If ready for DC today would send on  1. Vancomycin to be given at HD  3x a week for 2 weeks.  2. Would also cover for polymicrobial infection with oral augmentin 500 mg Q 24 also for 2 weeks.  Will fu on culture results and if no MRSA can change to HD dosed ancef.

## 2017-04-24 NOTE — Progress Notes (Signed)
Patient cleared for discharge. Prescriptions and instructions given. All questions answered. IV removed. Patient wheeled to entrance.

## 2017-04-24 NOTE — Discharge Summary (Signed)
Patrick Downs at [place] NAME: Patrick Downs    MR#:  [id number]  DATE OF BIRTH:  1976-01-16  DATE OF ADMISSION:  04/21/2017 ADMITTING PHYSICIAN: Gorden Harms, MD  DATE OF DISCHARGE: 04/24/2017  5:18 PM  PRIMARY CARE PHYSICIAN: Leonel Ramsay, MD    ADMISSION DIAGNOSIS:  Hyperkalemia [E87.5] Wound infection [T14.8XXA, L08.9]  DISCHARGE DIAGNOSIS:  Active Problems:   Sepsis ([place])   SECONDARY DIAGNOSIS:   Past Medical History:  Diagnosis Date  . Depression   . Dialysis patient ([place])   . ESRD (end stage renal disease) on dialysis ([place])   . Failure to thrive in adult   . Gout   . Guillain Barr syndrome ([place])   . HTN (hypertension)   . Kidney failure   . Pneumonia   . Renal insufficiency   . Respiratory failure [facility])     HOSPITAL COURSE:   1acute sepsis Secondary to #2  empiric vancomycin/Zosyn, follow-up on cultures   Blood culture is not sent, wound culture was sent yesterday by podiatry.  2acute infected right foot wound with associated cellulitis  wound cultures, empiric vancomycin/Zosyn, consult podiatry for expert, follow-up on cultures, and continue close medical monitoring  Xray does not show osteomyelitis.   Podiatry suggested no further surgical intervention but advised to have IV antibiotics so I called infectious disease consult.  Culture from wound was sent to guide antibiotic therapy.   ID suggest 2 weeks IV vanc with HD and give oral augmentin for 2 weeks for superimposed infections.  3chronic end-stage renal disease on hemodialysis Missed hemodialysis on saturday Consult nephrology for hemodialysis needs  4acute hyperkalemia Secondary to end-stage renal disease/missed hemodialysis Kayexalate x1, placed on telemetry bed,    DISCHARGE CONDITIONS:   Stable.  CONSULTS OBTAINED:  Treatment Team:  Murlean Iba, MD Leonel Ramsay, MD  DRUG ALLERGIES:   Allergies   Allergen Reactions  . Ivp Dye [Iodinated Diagnostic Agents] Other (See Comments)    Reaction unknown  . Other     Other reaction(s): Unknown Reaction unknown  . Minoxidil Other (See Comments)    "put fluid around my heart" "put fluid around my heart" PERICARDIAL EFFUSION Reaction unknown  . Morphine Other (See Comments)    Other reaction(s): Unknown Reaction unknown Makes patient aggressive  . Morphine And Related Other (See Comments)    Reaction unknown  . Omnipaque [Iohexol] Other (See Comments) and Itching    Rigors on one occasion, widespread itching on a separate occasion (resolved with Benadryl). Reaction unknown Reaction unknown tremors Rigors on one occasion, widespread itching on a separate occasion (resolved with Benadryl). Reaction unknown    DISCHARGE MEDICATIONS:   Allergies as of 04/24/2017      Reactions   Ivp Dye [iodinated Diagnostic Agents] Other (See Comments)   Reaction unknown   Other    Other reaction(s): Unknown Reaction unknown   Minoxidil Other (See Comments)   "put fluid around my heart" "put fluid around my heart" PERICARDIAL EFFUSION Reaction unknown   Morphine Other (See Comments)   Other reaction(s): Unknown Reaction unknown Makes patient aggressive   Morphine And Related Other (See Comments)   Reaction unknown   Omnipaque [iohexol] Other (See Comments), Itching   Rigors on one occasion, widespread itching on a separate occasion (resolved with Benadryl). Reaction unknown Reaction unknown tremors Rigors on one occasion, widespread itching on a separate occasion (resolved with Benadryl). Reaction unknown      Medication List  TAKE these medications   amoxicillin-clavulanate 875-125 MG tablet Commonly known as:  AUGMENTIN Take 1 tablet by mouth 2 (two) times daily for 12 days.   calcium acetate 667 MG capsule Commonly known as:  PHOSLO Take 3 capsules (2,001 mg total) by mouth 3 (three) times daily with meals.   gabapentin  300 MG capsule Commonly known as:  NEURONTIN Take 600 mg by mouth at bedtime.   midodrine 5 MG tablet Commonly known as:  PROAMATINE Take 1 tablet (5 mg total) by mouth 3 (three) times daily with meals.   oxyCODONE-acetaminophen 10-325 MG tablet Commonly known as:  PERCOCET Take 1 tablet by mouth every 6 (six) hours.   sildenafil 20 MG tablet Commonly known as:  REVATIO Take 20-100 mg by mouth as needed.   vancomycin 1-5 GM/200ML-% Soln Commonly known as:  VANCOCIN Inject 200 mLs (1,000 mg total) into the vein Every Tuesday,Thursday,and Saturday with dialysis for 12 days.        DISCHARGE INSTRUCTIONS:    Follow with PMD and at HD center in 2-3 days.  Follow with Podiatry and ID in 1-2 weeks.  If you experience worsening of your admission symptoms, develop shortness of breath, life threatening emergency, suicidal or homicidal thoughts you must seek medical attention immediately by calling 911 or calling your MD immediately  if symptoms less severe.  You Must read complete instructions/literature along with all the possible adverse reactions/side effects for all the Medicines you take and that have been prescribed to you. Take any new Medicines after you have completely understood and accept all the possible adverse reactions/side effects.   Please note  You were cared for by a hospitalist during your hospital stay. If you have any questions about your discharge medications or the care you received while you were in the hospital after you are discharged, you can call the unit and asked to speak with the hospitalist on call if the hospitalist that took care of you is not available. Once you are discharged, your primary care physician will handle any further medical issues. Please note that NO REFILLS for any discharge medications will be authorized once you are discharged, as it is imperative that you return to your primary care physician (or establish a relationship with a primary  care physician if you do not have one) for your aftercare needs so that they can reassess your need for medications and monitor your lab values.    Today   CHIEF COMPLAINT:   Chief Complaint  Patient presents with  . Wound Infection    HISTORY OF PRESENT ILLNESS:  Patrick Downs  is a 42 y.o. male presented to the emergency room with right foot wound with associated swelling, redness, end-stage renal disease-missed dialysis on today, in the emergency room patient was found to be febrile, tachypnea, potassium 5.7, creatinine 15, white count 11,000, examination noted for acute right fifth toe wound with associated cellulitis/pedal edema consistent with sepsis, patient now been admitted for acute sepsis secondary to acute infected right foot wound.    VITAL SIGNS:  Blood pressure 115/74, pulse 82, temperature 98.5 F (36.9 C), temperature source Oral, resp. rate 16, height 6\' 2"  (1.88 m), weight 115.8 kg (255 lb 6.4 oz), SpO2 97 %.  I/O:    Intake/Output Summary (Last 24 hours) at 04/24/2017 2150 Last data filed at 04/24/2017 0020 Gross per 24 hour  Intake 163 ml  Output 0 ml  Net 163 ml    PHYSICAL EXAMINATION:   GENERAL:  42 y.o.-year-old patient lying in the bed with no acute distress.  EYES: Pupils equal, round, reactive to light and accommodation. No scleral icterus. Extraocular muscles intact.  HEENT: Head atraumatic, normocephalic. Oropharynx and nasopharynx clear.  NECK:  Supple, no jugular venous distention. No thyroid enlargement, no tenderness.  LUNGS: Normal breath sounds bilaterally, no wheezing, rales,rhonchi or crepitation. No use of accessory muscles of respiration.  CARDIOVASCULAR: S1, S2 normal. No murmurs, rubs, or gallops.  ABDOMEN: Soft, nontender, nondistended. Bowel sounds present. No organomegaly or mass.  EXTREMITIES: No pedal edema, cyanosis, or clubbing. Right foot on 5th toe lateral side- 2 cm ulcer with slough at base. NEUROLOGIC: Cranial nerves II  through XII are intact. Muscle strength 5/5 in all extremities. Distal muscles on both hands and foot are week and atrophic- as per him since GB syndrome since 2003. Sensation lost in distal leg and foot too, chronic. Gait not checked.  PSYCHIATRIC: The patient is alert and oriented x 3.  SKIN: No obvious rash, lesion, or ulcer.      DATA REVIEW:   CBC Recent Labs  Lab 04/24/17 0527  WBC 6.1  HGB 9.9*  HCT 30.0*  PLT 145*    Chemistries  Recent Labs  Lab 04/21/17 1557 04/24/17 0527  NA 137 139  K 5.7* 4.4  CL 99* 97*  CO2 24 29  GLUCOSE 141* 111*  BUN 54* 39*  CREATININE 15.04* 11.41*  CALCIUM 8.1* 7.8*  AST 23  --   ALT 7*  --   ALKPHOS 43  --   BILITOT 0.9  --     Cardiac Enzymes No results for input(s): TROPONINI in the last 168 hours.  Microbiology Results  Results for orders placed or performed during the hospital encounter of 04/21/17  Aerobic Culture (superficial specimen)     Status: None (Preliminary result)   Collection Time: 04/21/17  4:45 PM  Result Value Ref Range Status   Specimen Description   Final    FOOT RIGHT Performed at [facility], [address], [place], [place] [address]    Special Requests   Final    NONE Performed at [facility], [address] [place], [place] [address]    Gram Stain   Final    FEW WBC PRESENT, PREDOMINANTLY PMN FEW GRAM POSITIVE COCCI IN CLUSTERS    Culture   Final    ABUNDANT STAPHYLOCOCCUS AUREUS SUSCEPTIBILITIES TO FOLLOW Performed at [facility], [place] [address], [place], [place] [address]    Report Status PENDING  Incomplete  MRSA PCR Screening     Status: Abnormal   Collection Time: 04/22/17  7:35 AM  Result Value Ref Range Status   MRSA by PCR POSITIVE (A) NEGATIVE Final    Comment:        The GeneXpert MRSA Assay (FDA approved for NASAL specimens only), is one component of a comprehensive MRSA colonization surveillance program. It is not intended to diagnose  MRSA infection nor to guide or monitor treatment for MRSA infections. RESULT CALLED TO, READ BACK BY AND VERIFIED WITH: Lum Babe ORNELAS AT 9147 ON 04/22/17 BY [facility] Performed at [facility], [address], [place], [place] [address]     RADIOLOGY:  No results found.  EKG:   Orders placed or performed during the hospital encounter of 04/21/17  . ED EKG  . ED EKG  . EKG 12-Lead  . EKG 12-Lead      Management plans discussed with the patient, family  and they are in agreement.  CODE STATUS:  Code Status History    Date Active Date Inactive Code Status Order ID Comments User Context   04/21/2017 20:10 04/24/2017 20:18 Full Code [id number]  Gorden Harms, MD Inpatient   11/27/2014 03:41 11/28/2014 19:04 Full Code [id number]  Lance Coon, MD Inpatient   08/26/2014 21:38 10/05/2014 16:37 Full Code [id number]  Corey Harold Inpatient   08/22/2014 14:57 08/26/2014 21:38 Full Code [id number]  Renea Ee, RN Inpatient      TOTAL TIME TAKING CARE OF THIS PATIENT: 35 minutes.    Vaughan Basta M.D on 04/24/2017 at 9:50 PM  Between 7am to 6pm - Pager - [phone]  After 6pm go to [email or web address] - password [email or web address] [facility]  Office  [phone]  CC: Primary care physician; Leonel Ramsay, MD   Note: This dictation was prepared with Dragon dictation along with smaller phrase technology. Any transcriptional errors that result from this process are unintentional.

## 2017-04-24 NOTE — Progress Notes (Signed)
Daily Progress Note   Subjective  - * No surgery found *  F/u right 5th toe ulcer.  Objective Vitals:   04/23/17 1751 04/23/17 2021 04/24/17 0355 04/24/17 0528  BP: (!) 86/44 (!) 101/56 115/74   Pulse:  78 82   Resp:  15 16   Temp:  98.2 F (36.8 C) 99.4 F (37.4 C) 98.5 F (36.9 C)  TempSrc:  Oral Oral Oral  SpO2:  98% 97%   Weight:   115.8 kg (255 lb 6.4 oz)   Height:        Physical Exam: Ulcer is improved. Minimal purulence.   Culture still pending.  Growing GPC's   Laboratory CBC    Component Value Date/Time   WBC 6.1 04/24/2017 0527   HGB 9.9 (L) 04/24/2017 0527   HGB 7.7 (L) 08/22/2014 0653   HCT 30.0 (L) 04/24/2017 0527   HCT 23.9 (L) 08/22/2014 0653   PLT 145 (L) 04/24/2017 0527   PLT 195 08/22/2014 0653    BMET    Component Value Date/Time   NA 139 04/24/2017 0527   NA 136 08/22/2014 0653   K 4.4 04/24/2017 0527   K 5.1 08/22/2014 0653   CL 97 (L) 04/24/2017 0527   CL 95 (L) 08/22/2014 0653   CO2 29 04/24/2017 0527   CO2 27 08/22/2014 0653   GLUCOSE 111 (H) 04/24/2017 0527   GLUCOSE 176 (H) 08/22/2014 0653   BUN 39 (H) 04/24/2017 0527   BUN 71 (H) 08/22/2014 0653   CREATININE 11.41 (H) 04/24/2017 0527   CREATININE 8.87 (H) 08/22/2014 0653   CALCIUM 7.8 (L) 04/24/2017 0527   CALCIUM 7.1 (L) 08/22/2014 0653   GFRNONAA 5 (L) 04/24/2017 0527   GFRNONAA 7 (L) 08/22/2014 0653   GFRAA 6 (L) 04/24/2017 0527   GFRAA 8 (L) 08/22/2014 7062    Assessment/Planning: Abscess right 5ht toe   Continue with daily dry dressing to right foot.  Abx per ID.    F/U with me in 1 week after discharge.  OK for d/c from podiatry standpoint.  Samara Deist A  04/24/2017, 10:02 AM

## 2017-04-24 NOTE — Care Management (Signed)
Patient is going to require vancomycin with his dialysis treatments. CM was informed that Nephrology gave the script to the patient to give to the clinic as the clinic is closed today for the holiday. Notified Elvera Bicker with [employer]

## 2017-04-25 DIAGNOSIS — L03031 Cellulitis of right toe: Secondary | ICD-10-CM | POA: Insufficient documentation

## 2017-04-25 LAB — AEROBIC CULTURE W GRAM STAIN (SUPERFICIAL SPECIMEN)

## 2017-04-25 LAB — AEROBIC CULTURE  (SUPERFICIAL SPECIMEN)

## 2017-06-03 ENCOUNTER — Emergency Department: Payer: Medicare Other

## 2017-06-03 ENCOUNTER — Other Ambulatory Visit: Payer: Self-pay

## 2017-06-03 ENCOUNTER — Emergency Department
Admission: EM | Admit: 2017-06-03 | Discharge: 2017-06-03 | Disposition: A | Discharge status: Home / Self Care | Payer: Medicare Other | Attending: Emergency Medicine | Admitting: Emergency Medicine

## 2017-06-03 DIAGNOSIS — Z992 Dependence on renal dialysis: Secondary | ICD-10-CM | POA: Insufficient documentation

## 2017-06-03 DIAGNOSIS — I120 Hypertensive chronic kidney disease with stage 5 chronic kidney disease or end stage renal disease: Secondary | ICD-10-CM | POA: Insufficient documentation

## 2017-06-03 DIAGNOSIS — Z79899 Other long term (current) drug therapy: Secondary | ICD-10-CM | POA: Diagnosis not present

## 2017-06-03 DIAGNOSIS — L97519 Non-pressure chronic ulcer of other part of right foot with unspecified severity: Secondary | ICD-10-CM | POA: Insufficient documentation

## 2017-06-03 DIAGNOSIS — L089 Local infection of the skin and subcutaneous tissue, unspecified: Secondary | ICD-10-CM | POA: Diagnosis not present

## 2017-06-03 DIAGNOSIS — F329 Major depressive disorder, single episode, unspecified: Secondary | ICD-10-CM | POA: Diagnosis not present

## 2017-06-03 DIAGNOSIS — M79671 Pain in right foot: Secondary | ICD-10-CM | POA: Diagnosis present

## 2017-06-03 DIAGNOSIS — T148XXA Other injury of unspecified body region, initial encounter: Secondary | ICD-10-CM

## 2017-06-03 DIAGNOSIS — N186 End stage renal disease: Secondary | ICD-10-CM | POA: Insufficient documentation

## 2017-06-03 LAB — COMPREHENSIVE METABOLIC PANEL
ALK PHOS: 45 U/L (ref 38–126)
ALT: 10 U/L — ABNORMAL LOW (ref 17–63)
ANION GAP: 14 (ref 5–15)
AST: 20 U/L (ref 15–41)
Albumin: 3.8 g/dL (ref 3.5–5.0)
BUN: 38 mg/dL — ABNORMAL HIGH (ref 6–20)
CALCIUM: 9.8 mg/dL (ref 8.9–10.3)
CO2: 25 mmol/L (ref 22–32)
Chloride: 102 mmol/L (ref 101–111)
Creatinine, Ser: 8.59 mg/dL — ABNORMAL HIGH (ref 0.61–1.24)
GFR calc non Af Amer: 7 mL/min — ABNORMAL LOW (ref >60)
GFR, EST AFRICAN AMERICAN: 8 mL/min — AB (ref >60)
Glucose, Bld: 87 mg/dL (ref 65–99)
Potassium: 4.8 mmol/L (ref 3.5–5.1)
SODIUM: 141 mmol/L (ref 135–145)
TOTAL PROTEIN: 8 g/dL (ref 6.5–8.1)
Total Bilirubin: 0.5 mg/dL (ref 0.3–1.2)

## 2017-06-03 LAB — CBC WITH DIFFERENTIAL/PLATELET
BASOS ABS: 0.1 10*3/uL (ref 0–0.1)
BASOS PCT: 1 %
Eosinophils Absolute: 0.2 10*3/uL (ref 0–0.7)
Eosinophils Relative: 3 %
HEMATOCRIT: 33.2 % — AB (ref 40.0–52.0)
Hemoglobin: 10.7 g/dL — ABNORMAL LOW (ref 13.0–18.0)
Lymphocytes Relative: 15 %
Lymphs Abs: 1.2 10*3/uL (ref 1.0–3.6)
MCH: 28.4 pg (ref 26.0–34.0)
MCHC: 32.1 g/dL (ref 32.0–36.0)
MCV: 88.3 fL (ref 80.0–100.0)
MONOS PCT: 9 %
Monocytes Absolute: 0.7 10*3/uL (ref 0.2–1.0)
NEUTROS ABS: 5.6 10*3/uL (ref 1.4–6.5)
NEUTROS PCT: 72 %
Platelets: 202 10*3/uL (ref 150–440)
RBC: 3.77 MIL/uL — ABNORMAL LOW (ref 4.40–5.90)
RDW: 16.6 % — ABNORMAL HIGH (ref 11.5–14.5)
WBC: 7.7 10*3/uL (ref 3.8–10.6)

## 2017-06-03 LAB — PROCALCITONIN: Procalcitonin: 1.1 ng/mL

## 2017-06-03 MED ORDER — VANCOMYCIN HCL 500 MG IV SOLR: 500.0000 mg | INTRAVENOUS | 0 refills | Status: DC | PRN

## 2017-06-03 MED ORDER — AMOXICILLIN-POT CLAVULANATE 875-125 MG PO TABS: 1.0000 | ORAL_TABLET | Freq: Every day | ORAL | 0 refills | Status: AC

## 2017-06-03 NOTE — Discharge Instructions (Addendum)
Please begin taking both of your antibiotics as prescribed and follow-up with your podiatrist this coming Thursday for reevaluation.  Return to the emergency department sooner for any concerns whatsoever.  It was a pleasure to take care of you today, and thank you for coming to our emergency department.  If you have any questions or concerns before leaving please ask the nurse to grab me and I'm more than happy to go through your aftercare instructions again.  If you were prescribed any opioid pain medication today such as Norco, Vicodin, Percocet, morphine, hydrocodone, or oxycodone please make sure you do not drive when you are taking this medication as it can alter your ability to drive safely.  If you have any concerns once you are home that you are not improving or are in fact getting worse before you can make it to your follow-up appointment, please do not hesitate to call 911 and come back for further evaluation.  Darel Hong, MD  Results for orders placed or performed during the hospital encounter of 06/03/17  Comprehensive metabolic panel  Result Value Ref Range   Sodium 141 135 - 145 mmol/L   Potassium 4.8 3.5 - 5.1 mmol/L   Chloride 102 101 - 111 mmol/L   CO2 25 22 - 32 mmol/L   Glucose, Bld 87 65 - 99 mg/dL   BUN 38 (H) 6 - 20 mg/dL   Creatinine, Ser 8.59 (H) 0.61 - 1.24 mg/dL   Calcium 9.8 8.9 - 10.3 mg/dL   Total Protein 8.0 6.5 - 8.1 g/dL   Albumin 3.8 3.5 - 5.0 g/dL   AST 20 15 - 41 U/L   ALT 10 (L) 17 - 63 U/L   Alkaline Phosphatase 45 38 - 126 U/L   Total Bilirubin 0.5 0.3 - 1.2 mg/dL   GFR calc non Af Amer 7 (L) >60 mL/min   GFR calc Af Amer 8 (L) >60 mL/min   Anion gap 14 5 - 15  CBC with Differential  Result Value Ref Range   WBC 7.7 3.8 - 10.6 K/uL   RBC 3.77 (L) 4.40 - 5.90 MIL/uL   Hemoglobin 10.7 (L) 13.0 - 18.0 g/dL   HCT 33.2 (L) 40.0 - 52.0 %   MCV 88.3 80.0 - 100.0 fL   MCH 28.4 26.0 - 34.0 pg   MCHC 32.1 32.0 - 36.0 g/dL   RDW 16.6 (H) 11.5 -  14.5 %   Platelets 202 150 - 440 K/uL   Neutrophils Relative % 72 %   Neutro Abs 5.6 1.4 - 6.5 K/uL   Lymphocytes Relative 15 %   Lymphs Abs 1.2 1.0 - 3.6 K/uL   Monocytes Relative 9 %   Monocytes Absolute 0.7 0.2 - 1.0 K/uL   Eosinophils Relative 3 %   Eosinophils Absolute 0.2 0 - 0.7 K/uL   Basophils Relative 1 %   Basophils Absolute 0.1 0 - 0.1 K/uL  Procalcitonin  Result Value Ref Range   Procalcitonin 1.10 ng/mL   Dg Foot Complete Right  Result Date: 06/03/2017 CLINICAL DATA:  Acute right foot pain for 3 days. History of recent right foot infection. Patient with end-stage renal disease. EXAM: RIGHT FOOT COMPLETE - 3+ VIEW COMPARISON:  04/21/2017 radiographs FINDINGS: There is no evidence of acute fracture, subluxation or dislocation. No radiographic evidence of osteomyelitis identified. Vascular calcifications are again noted. There may be mild soft tissue swelling present. IMPRESSION: Question mild soft tissue swelling.  No acute bony abnormality. Electronically Signed   By:  Margarette Canada M.D.   On: 06/03/2017 08:50

## 2017-06-03 NOTE — ED Notes (Signed)
xray at bedside.  

## 2017-06-03 NOTE — ED Notes (Signed)
Dry, sterile dressing applied

## 2017-06-03 NOTE — ED Provider Notes (Signed)
[facility] Emergency Department Provider Note  ____________________________________________   First MD Initiated Contact with Patient 06/03/17 [phone]     (approximate)  I have reviewed the triage vital signs and the nursing notes.   HISTORY  Chief Complaint Leg Pain   HPI Patrick Downs is a 42 y.o. male who self presents the emergency department with several days of increasing pain to the lateral aspect of his right foot.  He has a complex past medical history including end-stage renal disease for which she receives hemodialysis Monday Wednesday Friday.  He has been fighting a wound infection to his right foot for the past several months.  He has a history of paraplegia cannot feel his feet.  He had previously completed a course of vancomycin and Augmentin for the infection that resolved.  He has been off all antibiotics for some time.  He denies fevers or chills.  He does have moderate severity throbbing aching discomfort in his right leg.  Seems to be somewhat worse with movement somewhat improved with rest.  He has noted a foul-smelling discharge from his wound.  Past Medical History:  Diagnosis Date  . Depression   . Dialysis patient ([place])   . ESRD (end stage renal disease) on dialysis ([place])   . Failure to thrive in adult   . Gout   . Guillain Barr syndrome ([place])   . HTN (hypertension)   . Kidney failure   . Pneumonia   . Renal insufficiency   . Respiratory failure [facility])     Patient Active Problem List   Diagnosis Date Noted  . Rectal pain 11/27/2014  . Encounter for imaging study to confirm nasogastric (NG) tube placement   . Encounter for nasogastric (NG) tube placement   . Respiratory failure, acute ([place])   . Guillain Barr syndrome, lower ext weakness 08/27/2014  . CAP (community acquired pneumonia) 08/23/2014  . ESRD on dialysis ([place]) 08/23/2014  . 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 ARDS (adult respiratory distress syndrome) ([place])  08/23/2014  . Septic shock ([employer]) 08/23/2014  . Hypotension 08/23/2014  . Acute respiratory failure ([place]) 08/23/2014  . Fever   . Sepsis ([place]) 08/22/2014    Past Surgical History:  Procedure Laterality Date  . AV FISTULA PLACEMENT     x5      2 graphs  . PARATHYROIDECTOMY    . RENAL BIOPSY    . tonsiilectomy    . tracheotomy      Prior to Admission medications   Medication Sig Start Date End Date Taking? Authorizing Provider  amoxicillin-clavulanate (AUGMENTIN) 875-125 MG tablet Take 1 tablet by mouth daily for 14 days. 06/03/17 06/17/17  Darel Hong, MD  calcium acetate (PHOSLO) 667 MG capsule Take 3 capsules (2,001 mg total) by mouth 3 (three) times daily with meals. 08/26/14   [name], MD  gabapentin (NEURONTIN) 300 MG capsule Take 600 mg by mouth at bedtime.     [provider]  midodrine (PROAMATINE) 5 MG tablet Take 1 tablet (5 mg total) by mouth 3 (three) times daily with meals. 11/28/14   Theodoro Grist, MD  oxyCODONE-acetaminophen (PERCOCET) 10-325 MG tablet Take 1 tablet by mouth every 6 (six) hours. 03/21/17   [provider]  sildenafil (REVATIO) 20 MG tablet Take 20-100 mg by mouth as needed. 03/02/17   [provider]  vancomycin 500 mg in sodium chloride 0.9 % 100 mL Inject 500 mg into the vein every dialysis. 06/03/17   Darel Hong, MD  Allergies Ivp dye [iodinated diagnostic agents]; Other; Minoxidil; Morphine; Morphine and related; and Omnipaque [iohexol]  Family History  Problem Relation Age of Onset  . Diabetes Mellitus II Father   . Kidney disease Father   . Kidney failure Paternal Grandfather   . Prostate cancer Neg Hx   . Kidney cancer Neg Hx   . Bladder Cancer Neg Hx     Social History Social History   Tobacco Use  . Smoking status: Never Smoker  . Smokeless tobacco: Never Used  Substance Use Topics  . Alcohol use: No  . Drug use: Yes    Types: Marijuana    Review of Systems Constitutional: No  fever/chills Eyes: No visual changes. ENT: No sore throat. Cardiovascular: Denies chest pain. Respiratory: Denies shortness of breath. Gastrointestinal: No abdominal pain.  No nausea, no vomiting.  No diarrhea.  No constipation. Genitourinary: Negative for dysuria. Musculoskeletal: Negative for back pain. Skin: Negative for rash. Neurological: Negative for headaches, focal weakness or numbness.   ____________________________________________   PHYSICAL EXAM:  VITAL SIGNS: ED Triage Vitals  Enc Vitals Group     BP 06/03/17 0758 102/72     Pulse Rate 06/03/17 0758 72     Resp 06/03/17 0758 18     Temp 06/03/17 0758 98.3 F (36.8 C)     Temp Source 06/03/17 0758 Oral     SpO2 06/03/17 0758 95 %     Weight 06/03/17 0757 250 lb (113.4 kg)     Height 06/03/17 0757 6\' 2"  (1.88 m)     Head Circumference --      Peak Flow --      Pain Score 06/03/17 0757 10     Pain Loc --      Pain Edu? --      Excl. in [place]? --     Constitutional: Alert and oriented x4 well-appearing nontoxic no diaphoresis speaks in full clear sentences Eyes: PERRL EOMI. Head: Atraumatic. Nose: No congestion/rhinnorhea. Mouth/Throat: No trismus Neck: No stridor.   Cardiovascular: Normal rate, regular rhythm. Grossly normal heart sounds.  Good peripheral circulation. Respiratory: Normal respiratory effort.  No retractions. Lungs CTAB and moving good air Gastrointestinal: Soft nontender Musculoskeletal: 2 cm ulcer to lateral aspect of right pinky toe.  Some granulation tissue although some fibrinous tissue.  No frank purulence but some foul smell.  2+ dorsalis pedis pulse Neurologic:  Normal speech and language. No gross focal neurologic deficits are appreciated. Skin:  Skin is warm, dry and intact. No rash noted. Psychiatric: Mood and affect are normal. Speech and behavior are normal.    ____________________________________________   DIFFERENTIAL includes but not limited to  Osteomyelitis, wound  infection, necrotizing soft tissue infection, cellulitis ____________________________________________   LABS (all labs ordered are listed, but only abnormal results are displayed)  Labs Reviewed  COMPREHENSIVE METABOLIC PANEL - Abnormal; Notable for the following components:      Result Value   BUN 38 (*)    Creatinine, Ser 8.59 (*)    ALT 10 (*)    GFR calc non Af Amer 7 (*)    GFR calc Af Amer 8 (*)    All other components within normal limits  CBC WITH DIFFERENTIAL/PLATELET - Abnormal; Notable for the following components:   RBC 3.77 (*)    Hemoglobin 10.7 (*)    HCT 33.2 (*)    RDW 16.6 (*)    All other components within normal limits  PROCALCITONIN    Lab work reviewed by me unremarkable  aside from elevated pro-calcitonin which is nonspecific although could represent infection __________________________________________  EKG   ____________________________________________  RADIOLOGY  X-ray of the right foot with no evidence of osteomyelitis ____________________________________________   PROCEDURES  Procedure(s) performed: no  Procedures  Critical Care performed: no  Observation: no ____________________________________________   INITIAL IMPRESSION / ASSESSMENT AND PLAN / ED COURSE  Pertinent labs & imaging results that were available during my care of the patient were reviewed by me and considered in my medical decision making (see chart for details).  The patient is well-appearing and not septic.  He has a strong pulse in his right foot.  He does have an ulcer which appears to be infected.  No obvious evidence of osteomyelitis.  According to previous infectious disease note the patient was receiving vancomycin 3 times a week with dialysis as well as Augmentin once a day.  I will reinitiate this strategy and refer him back to his primary care physician who is also his infectious disease specialist.  The patient verbalizes understanding and agreement the plan  with strict return precautions.      ____________________________________________   FINAL CLINICAL IMPRESSION(S) / ED DIAGNOSES  Final diagnoses:  Wound infection      NEW MEDICATIONS STARTED DURING THIS VISIT:  Discharge Medication List as of 06/03/2017 10:43 AM    START taking these medications   Details  amoxicillin-clavulanate (AUGMENTIN) 875-125 MG tablet Take 1 tablet by mouth daily for 14 days., Starting Sun 06/03/2017, Until Sun 06/17/2017, Print    vancomycin 500 mg in sodium chloride 0.9 % 100 mL Inject 500 mg into the vein every dialysis., Starting Sun 06/03/2017, Print         Note:  This document was prepared using Dragon voice recognition software and may include unintentional dictation errors.     Darel Hong, MD 06/04/17 [phone]

## 2017-06-03 NOTE — ED Triage Notes (Signed)
Pt c/o increase pain and swelling for the past 3 days, Hx of infection in the same foot recently.

## 2017-06-07 ENCOUNTER — Other Ambulatory Visit: Payer: Self-pay | Admitting: Podiatry

## 2017-06-08 ENCOUNTER — Ambulatory Visit: Payer: Medicare Other | Admitting: Anesthesiology

## 2017-06-08 ENCOUNTER — Encounter: Payer: Self-pay | Admitting: Certified Registered Nurse Anesthetist

## 2017-06-08 ENCOUNTER — Other Ambulatory Visit: Payer: Self-pay

## 2017-06-08 ENCOUNTER — Ambulatory Visit
Admission: RE | Admit: 2017-06-08 | Discharge: 2017-06-08 | Disposition: A | Discharge status: Home / Self Care | Payer: Medicare Other | Source: Ambulatory Visit | Attending: Podiatry | Admitting: Podiatry

## 2017-06-08 ENCOUNTER — Encounter
Admission: RE | Disposition: A | Discharge status: Home / Self Care | Payer: Self-pay | Source: Ambulatory Visit | Attending: Podiatry

## 2017-06-08 DIAGNOSIS — Z888 Allergy status to other drugs, medicaments and biological substances status: Secondary | ICD-10-CM | POA: Insufficient documentation

## 2017-06-08 DIAGNOSIS — Z91041 Radiographic dye allergy status: Secondary | ICD-10-CM | POA: Diagnosis not present

## 2017-06-08 DIAGNOSIS — L97519 Non-pressure chronic ulcer of other part of right foot with unspecified severity: Secondary | ICD-10-CM | POA: Insufficient documentation

## 2017-06-08 DIAGNOSIS — I120 Hypertensive chronic kidney disease with stage 5 chronic kidney disease or end stage renal disease: Secondary | ICD-10-CM | POA: Diagnosis not present

## 2017-06-08 DIAGNOSIS — Z992 Dependence on renal dialysis: Secondary | ICD-10-CM | POA: Insufficient documentation

## 2017-06-08 DIAGNOSIS — M868X7 Other osteomyelitis, ankle and foot: Secondary | ICD-10-CM | POA: Insufficient documentation

## 2017-06-08 DIAGNOSIS — Z885 Allergy status to narcotic agent status: Secondary | ICD-10-CM | POA: Insufficient documentation

## 2017-06-08 DIAGNOSIS — Z79899 Other long term (current) drug therapy: Secondary | ICD-10-CM | POA: Diagnosis not present

## 2017-06-08 DIAGNOSIS — F329 Major depressive disorder, single episode, unspecified: Secondary | ICD-10-CM | POA: Insufficient documentation

## 2017-06-08 DIAGNOSIS — N186 End stage renal disease: Secondary | ICD-10-CM | POA: Insufficient documentation

## 2017-06-08 HISTORY — PX: AMPUTATION TOE: SHX6595

## 2017-06-08 LAB — POCT I-STAT 4, (NA,K, GLUC, HGB,HCT)
Glucose, Bld: 89 mg/dL (ref 65–99)
HCT: 35 % — ABNORMAL LOW (ref 39.0–52.0)
Hemoglobin: 11.9 g/dL — ABNORMAL LOW (ref 13.0–17.0)
Potassium: 4.8 mmol/L (ref 3.5–5.1)
Sodium: 139 mmol/L (ref 135–145)

## 2017-06-08 LAB — SURGICAL PCR SCREEN
MRSA, PCR: POSITIVE — AB
STAPHYLOCOCCUS AUREUS: POSITIVE — AB

## 2017-06-08 SURGERY — AMPUTATION, TOE
Anesthesia: General | Laterality: Right

## 2017-06-08 MED ORDER — PROPOFOL 10 MG/ML IV BOLUS
INTRAVENOUS | Status: AC
  Filled 2017-06-08: qty 20

## 2017-06-08 MED ORDER — MIDAZOLAM HCL 2 MG/2ML IJ SOLN
INTRAMUSCULAR | Status: AC
  Filled 2017-06-08: qty 2

## 2017-06-08 MED ORDER — LIDOCAINE HCL (PF) 2 % IJ SOLN
INTRAMUSCULAR | Status: AC
  Filled 2017-06-08: qty 10

## 2017-06-08 MED ORDER — BUPIVACAINE HCL 0.5 % IJ SOLN
INTRAMUSCULAR | Status: DC | PRN
  Administered 2017-06-08: 5 mL

## 2017-06-08 MED ORDER — SODIUM CHLORIDE 0.9 % IV SOLN
Freq: Once | INTRAVENOUS | Status: AC
  Administered 2017-06-08: 12:00:00 via INTRAVENOUS

## 2017-06-08 MED ORDER — BUPIVACAINE HCL (PF) 0.5 % IJ SOLN
INTRAMUSCULAR | Status: AC
  Filled 2017-06-08: qty 30

## 2017-06-08 MED ORDER — ONDANSETRON HCL 4 MG/2ML IJ SOLN
INTRAMUSCULAR | Status: DC | PRN
  Administered 2017-06-08: 4 mg via INTRAVENOUS

## 2017-06-08 MED ORDER — ONDANSETRON HCL 4 MG/2ML IJ SOLN
INTRAMUSCULAR | Status: AC
  Filled 2017-06-08: qty 2

## 2017-06-08 MED ORDER — PROPOFOL 500 MG/50ML IV EMUL
INTRAVENOUS | Status: DC | PRN
  Administered 2017-06-08: 140 ug/kg/min via INTRAVENOUS

## 2017-06-08 MED ORDER — MIDAZOLAM HCL 2 MG/2ML IJ SOLN
INTRAMUSCULAR | Status: DC | PRN
  Administered 2017-06-08: 2 mg via INTRAVENOUS

## 2017-06-08 MED ORDER — DEXAMETHASONE SODIUM PHOSPHATE 10 MG/ML IJ SOLN
INTRAMUSCULAR | Status: AC
  Filled 2017-06-08: qty 1

## 2017-06-08 MED ORDER — FENTANYL CITRATE (PF) 100 MCG/2ML IJ SOLN
INTRAMUSCULAR | Status: DC | PRN
  Administered 2017-06-08 (×2): 25 ug via INTRAVENOUS

## 2017-06-08 MED ORDER — PROPOFOL 10 MG/ML IV BOLUS
INTRAVENOUS | Status: DC | PRN
  Administered 2017-06-08: 40 mg via INTRAVENOUS
  Administered 2017-06-08: 80 mg via INTRAVENOUS

## 2017-06-08 MED ORDER — LIDOCAINE HCL (CARDIAC) 20 MG/ML IV SOLN
INTRAVENOUS | Status: DC | PRN
  Administered 2017-06-08: 60 mg via INTRAVENOUS

## 2017-06-08 MED ORDER — POVIDONE-IODINE 7.5 % EX SOLN
Freq: Once | CUTANEOUS | Status: DC
  Filled 2017-06-08: qty 118

## 2017-06-08 MED ORDER — LIDOCAINE HCL (PF) 1 % IJ SOLN
INTRAMUSCULAR | Status: AC
  Filled 2017-06-08: qty 30

## 2017-06-08 MED ORDER — LIDOCAINE HCL (PF) 1 % IJ SOLN
INTRAMUSCULAR | Status: DC | PRN
  Administered 2017-06-08: 5 mL

## 2017-06-08 MED ORDER — PHENYLEPHRINE HCL 10 MG/ML IJ SOLN
INTRAMUSCULAR | Status: DC | PRN
  Administered 2017-06-08 (×2): 100 ug via INTRAVENOUS

## 2017-06-08 MED ORDER — FENTANYL CITRATE (PF) 100 MCG/2ML IJ SOLN
INTRAMUSCULAR | Status: AC
  Filled 2017-06-08: qty 2

## 2017-06-08 SURGICAL SUPPLY — 40 items
BANDAGE ELASTIC 4 LF NS (GAUZE/BANDAGES/DRESSINGS) ×2 IMPLANT
BLADE OSC/SAGITTAL MD 5.5X18 (BLADE) IMPLANT
BLADE SURG MINI STRL (BLADE) ×2 IMPLANT
BNDG CONFORM 3 STRL LF (GAUZE/BANDAGES/DRESSINGS) ×4 IMPLANT
BNDG ESMARK 4X12 TAN STRL LF (GAUZE/BANDAGES/DRESSINGS) IMPLANT
BNDG GAUZE 4.5X4.1 6PLY STRL (MISCELLANEOUS) ×2 IMPLANT
CANISTER SUCT 1200ML W/VALVE (MISCELLANEOUS) ×2 IMPLANT
DRAPE FLUOR MINI C-ARM 54X84 (DRAPES) IMPLANT
DRAPE XRAY CASSETTE 23X24 (DRAPES) IMPLANT
DURAPREP 26ML APPLICATOR (WOUND CARE) ×2 IMPLANT
ELECT REM PT RETURN 9FT ADLT (ELECTROSURGICAL) ×2
ELECTRODE REM PT RTRN 9FT ADLT (ELECTROSURGICAL) ×1 IMPLANT
GAUZE PACKING IODOFORM 1/2 (PACKING) ×2 IMPLANT
GAUZE PETRO XEROFOAM 1X8 (MISCELLANEOUS) ×2 IMPLANT
GAUZE SPONGE 4X4 12PLY STRL (GAUZE/BANDAGES/DRESSINGS) ×2 IMPLANT
GAUZE STRETCH 2X75IN STRL (MISCELLANEOUS) ×2 IMPLANT
GLOVE BIO SURGEON STRL SZ7.5 (GLOVE) ×2 IMPLANT
GLOVE INDICATOR 8.0 STRL GRN (GLOVE) ×2 IMPLANT
GOWN STRL REUS W/ TWL LRG LVL3 (GOWN DISPOSABLE) ×2 IMPLANT
GOWN STRL REUS W/TWL LRG LVL3 (GOWN DISPOSABLE) ×2
KIT TURNOVER KIT A (KITS) ×2 IMPLANT
LABEL OR SOLS (LABEL) ×2 IMPLANT
NEEDLE FILTER BLUNT 18X 1/2SAF (NEEDLE) ×1
NEEDLE FILTER BLUNT 18X1 1/2 (NEEDLE) ×1 IMPLANT
NEEDLE HYPO 25X1 1.5 SAFETY (NEEDLE) ×2 IMPLANT
NS IRRIG 500ML POUR BTL (IV SOLUTION) ×2 IMPLANT
PACK EXTREMITY ARMC (MISCELLANEOUS) ×2 IMPLANT
PAD ABD DERMACEA PRESS 5X9 (GAUZE/BANDAGES/DRESSINGS) IMPLANT
PULSAVAC PLUS IRRIG FAN TIP (DISPOSABLE) ×2
SHIELD FULL FACE ANTIFOG 7M (MISCELLANEOUS) ×2 IMPLANT
SOL .9 NS 3000ML IRR  AL (IV SOLUTION) ×1
SOL .9 NS 3000ML IRR UROMATIC (IV SOLUTION) ×1 IMPLANT
STOCKINETTE M/LG 89821 (MISCELLANEOUS) ×2 IMPLANT
STRAP SAFETY 5IN WIDE (MISCELLANEOUS) ×2 IMPLANT
SUT ETHILON 3-0 FS-10 30 BLK (SUTURE) ×2
SUT ETHILON 5-0 FS-2 18 BLK (SUTURE) IMPLANT
SUT VIC AB 4-0 FS2 27 (SUTURE) ×2 IMPLANT
SUTURE EHLN 3-0 FS-10 30 BLK (SUTURE) ×1 IMPLANT
SYR 10ML LL (SYRINGE) ×6 IMPLANT
TIP FAN IRRIG PULSAVAC PLUS (DISPOSABLE) ×1 IMPLANT

## 2017-06-08 NOTE — Anesthesia Preprocedure Evaluation (Addendum)
Anesthesia Evaluation  Patient identified by MRN, date of birth, ID band Patient awake    Reviewed: Allergy & Precautions, NPO status , Patient's Chart, lab work & pertinent test results  History of Anesthesia Complications Negative for: history of anesthetic complications  Airway Mallampati: III       Dental   Pulmonary neg sleep apnea, neg COPD,           Cardiovascular hypertension, Pt. on medications (-) Past MI and (-) CHF (-) dysrhythmias (-) Valvular Problems/Murmurs     Neuro/Psych Depression    GI/Hepatic Neg liver ROS, neg GERD  ,  Endo/Other  neg diabetes  Renal/GU ESRF and DialysisRenal disease     Musculoskeletal   Abdominal   Peds  Hematology   Anesthesia Other Findings   Reproductive/Obstetrics                            Anesthesia Physical Anesthesia Plan  ASA: IV  Anesthesia Plan: General   Post-op Pain Management:    Induction: Intravenous  PONV Risk Score and Plan: 2 and Dexamethasone and Ondansetron  Airway Management Planned: LMA  Additional Equipment:   Intra-op Plan:   Post-operative Plan:   Informed Consent: I have reviewed the patients History and Physical, chart, labs and discussed the procedure including the risks, benefits and alternatives for the proposed anesthesia with the patient or authorized representative who has indicated his/her understanding and acceptance.     Plan Discussed with:   Anesthesia Plan Comments:         Anesthesia Quick Evaluation

## 2017-06-08 NOTE — Discharge Instructions (Signed)
[facility] °[facility] ° °POST OPERATIVE INSTRUCTIONS FOR DR. TROXLER AND DR. Kandon Hosking °[facility] ° ° °1. Take your medication as prescribed.  Pain medication should be taken only as needed. ° °2. Keep the dressing clean, dry and intact. ° °3. Keep your foot elevated above the heart level for the first 48 hours. ° °4. Walking to the bathroom and brief periods of walking are acceptable, unless we have instructed you to be non-weight bearing. ° °5. Always wear your post-op shoe when walking.  Always use your crutches if you are to be non-weight bearing. ° °6. Do not take a shower. Baths are permissible as long as the foot is kept out of the water.  ° °7. Every hour you are awake:  °- Bend your knee 15 times. °- Flex foot 15 times °- Massage calf 15 times ° °8. Call [facility] ([phone]) if any of the following problems occur: °- You develop a temperature or fever. °- The bandage becomes saturated with blood. °- Medication does not stop your pain. °- Injury of the foot occurs. °- Any symptoms of infection including redness, odor, or red streaks running from wound. °-  ° °

## 2017-06-08 NOTE — Progress Notes (Signed)
IV infused 100cc

## 2017-06-08 NOTE — Op Note (Signed)
Operative note   Surgeon:Claira Jeter    Assistant:none    Preop diagnosis: Right fifth toe osteomyelitis    Postop diagnosis: Same    Procedure: Amputation right fifth toe MTPJ    EBL: Minimal    Anesthesia:local and IV sedation    Hemostasis: None    Specimen: Amputated right fifth toe with osteomyelitis and wound culture right fifth toe    Complications: None    Operative indications:Patrick Downs is an 42 y.o. that presents today for surgical intervention.  The risks/benefits/alternatives/complications have been discussed and consent has been given.    Procedure:  Patient was brought into the OR and placed on the operating table in thesupine position. After anesthesia was obtained theright lower extremity was prepped and draped in usual sterile fashion.  Attention was directed to the right fifth toe at the MTPJ where a racquet incision was made medial and lateral.  Full-thickness incision was taken down to the tarsal phalangeal joint region.  The toe was then disarticulated at the metatarsophalangeal joint.  The ulcerative site was kept intact on the residual skin and toe.  This was removed from the surgical field in toto.  A wound culture was performed to the deep joint that had been exposed to infection.  The wound was then flushed with copious amounts of irrigation.  Closure was then performed with a 3-0 nylon.  A bulky sterile dressing was then applied.    Patient tolerated the procedure and anesthesia well.  Was transported from the OR to the PACU with all vital signs stable and vascular status intact. To be discharged per routine protocol.  Will follow up in approximately 1 week in the outpatient clinic.

## 2017-06-08 NOTE — H&P (Signed)
HISTORY AND PHYSICAL INTERVAL NOTE:  06/08/2017  11:31 AM  Patrick Downs  has presented today for surgery, with the diagnosis of OSTEOMYELITIS.  The various methods of treatment have been discussed with the patient.  No guarantees were given.  After consideration of risks, benefits and other options for treatment, the patient has consented to surgery.  I have reviewed the patients' chart and labs.    Patient Vitals for the past 24 hrs:  BP Temp Temp src Pulse Resp SpO2 Height Weight  06/08/17 1034 (!) 142/79 98.6 F (37 C) Oral 82 16 98 % 6\' 2"  (1.88 m) 113.4 kg (250 lb)    A history and physical examination was performed in my office.  The patient was reexamined.  There have been no changes to this history and physical examination. Plan for amputation right 5th toe. Samara Deist A

## 2017-06-08 NOTE — Progress Notes (Signed)
Toes warm and dry to right foot   Elevated on pillows  Circulation positive to right foot

## 2017-06-08 NOTE — Anesthesia Post-op Follow-up Note (Signed)
Anesthesia QCDR form completed.        

## 2017-06-08 NOTE — Transfer of Care (Signed)
Immediate Anesthesia Transfer of Care Note  Patient: Patrick Downs  Procedure(s) Performed: AMPUTATION TOE RIGHT FIFTH TOE (Right )  Patient Location: PACU  Anesthesia Type:MAC  Level of Consciousness: awake, alert , oriented and patient cooperative  Airway & Oxygen Therapy: Patient Spontanous Breathing  Post-op Assessment: Report given to RN and Post -op Vital signs reviewed and stable  Post vital signs: Reviewed and stable  Last Vitals:  Vitals:   06/08/17 1034 06/08/17 1225  BP: (!) 142/79 90/66  Pulse: 82 78  Resp: 16 (!) 9  Temp: 37 C 36.4 C  SpO2: 98% 100%    Last Pain:  Vitals:   06/08/17 1225  TempSrc:   PainSc: Asleep         Complications: No apparent anesthesia complications

## 2017-06-08 NOTE — Anesthesia Postprocedure Evaluation (Signed)
Anesthesia Post Note  Patient: Patrick Downs  Procedure(s) Performed: AMPUTATION TOE RIGHT FIFTH TOE (Right )  Patient location during evaluation: PACU Anesthesia Type: General Level of consciousness: awake and alert Pain management: pain level controlled Vital Signs Assessment: post-procedure vital signs reviewed and stable Respiratory status: spontaneous breathing and respiratory function stable Cardiovascular status: stable Anesthetic complications: no     Last Vitals:  Vitals:   06/08/17 1303 06/08/17 1320  BP: 113/69 108/62  Pulse: 83 79  Resp: 16   Temp: (!) 35.8 C   SpO2: 100% 100%    Last Pain:  Vitals:   06/08/17 1303  TempSrc: Temporal  PainSc:                  Ennis Heavner K

## 2017-06-12 LAB — SURGICAL PATHOLOGY

## 2017-06-13 LAB — AEROBIC/ANAEROBIC CULTURE W GRAM STAIN (SURGICAL/DEEP WOUND)

## 2017-08-16 ENCOUNTER — Encounter: Payer: Self-pay | Admitting: *Deleted

## 2017-08-16 ENCOUNTER — Inpatient Hospital Stay
Admission: EM | Admit: 2017-08-16 | Discharge: 2017-08-17 | DRG: 640 | Disposition: A | Discharge status: Home / Self Care | Payer: Medicare Other | Attending: Internal Medicine | Admitting: Internal Medicine

## 2017-08-16 ENCOUNTER — Other Ambulatory Visit: Payer: Self-pay

## 2017-08-16 ENCOUNTER — Emergency Department: Payer: Medicare Other

## 2017-08-16 DIAGNOSIS — Z841 Family history of disorders of kidney and ureter: Secondary | ICD-10-CM | POA: Diagnosis not present

## 2017-08-16 DIAGNOSIS — Z885 Allergy status to narcotic agent status: Secondary | ICD-10-CM | POA: Diagnosis not present

## 2017-08-16 DIAGNOSIS — E8770 Fluid overload, unspecified: Secondary | ICD-10-CM | POA: Diagnosis not present

## 2017-08-16 DIAGNOSIS — Z79899 Other long term (current) drug therapy: Secondary | ICD-10-CM

## 2017-08-16 DIAGNOSIS — D631 Anemia in chronic kidney disease: Secondary | ICD-10-CM | POA: Diagnosis present

## 2017-08-16 DIAGNOSIS — Z888 Allergy status to other drugs, medicaments and biological substances status: Secondary | ICD-10-CM | POA: Diagnosis not present

## 2017-08-16 DIAGNOSIS — N186 End stage renal disease: Secondary | ICD-10-CM | POA: Diagnosis present

## 2017-08-16 DIAGNOSIS — E875 Hyperkalemia: Principal | ICD-10-CM | POA: Diagnosis present

## 2017-08-16 DIAGNOSIS — R7989 Other specified abnormal findings of blood chemistry: Secondary | ICD-10-CM

## 2017-08-16 DIAGNOSIS — Z992 Dependence on renal dialysis: Secondary | ICD-10-CM | POA: Diagnosis not present

## 2017-08-16 DIAGNOSIS — R531 Weakness: Secondary | ICD-10-CM

## 2017-08-16 DIAGNOSIS — G610 Guillain-Barre syndrome: Secondary | ICD-10-CM | POA: Diagnosis present

## 2017-08-16 DIAGNOSIS — I120 Hypertensive chronic kidney disease with stage 5 chronic kidney disease or end stage renal disease: Secondary | ICD-10-CM | POA: Diagnosis not present

## 2017-08-16 DIAGNOSIS — N2581 Secondary hyperparathyroidism of renal origin: Secondary | ICD-10-CM | POA: Diagnosis not present

## 2017-08-16 DIAGNOSIS — R739 Hyperglycemia, unspecified: Secondary | ICD-10-CM | POA: Diagnosis not present

## 2017-08-16 DIAGNOSIS — Z91041 Radiographic dye allergy status: Secondary | ICD-10-CM | POA: Diagnosis not present

## 2017-08-16 DIAGNOSIS — Z9115 Patient's noncompliance with renal dialysis: Secondary | ICD-10-CM

## 2017-08-16 DIAGNOSIS — Z89421 Acquired absence of other right toe(s): Secondary | ICD-10-CM

## 2017-08-16 LAB — CBC WITH DIFFERENTIAL/PLATELET
Basophils Absolute: 0 10*3/uL (ref 0–0.1)
Basophils Relative: 0 %
EOS PCT: 0 %
Eosinophils Absolute: 0 10*3/uL (ref 0–0.7)
HCT: 39.2 % — ABNORMAL LOW (ref 40.0–52.0)
HEMOGLOBIN: 12.9 g/dL — AB (ref 13.0–18.0)
LYMPHS PCT: 7 %
Lymphs Abs: 0.4 10*3/uL — ABNORMAL LOW (ref 1.0–3.6)
MCH: 29.5 pg (ref 26.0–34.0)
MCHC: 33 g/dL (ref 32.0–36.0)
MCV: 89.3 fL (ref 80.0–100.0)
Monocytes Absolute: 0 10*3/uL — ABNORMAL LOW (ref 0.2–1.0)
Monocytes Relative: 1 %
Neutro Abs: 5.6 10*3/uL (ref 1.4–6.5)
Neutrophils Relative %: 92 %
PLATELETS: 151 10*3/uL (ref 150–440)
RBC: 4.39 MIL/uL — AB (ref 4.40–5.90)
RDW: 20.4 % — ABNORMAL HIGH (ref 11.5–14.5)
WBC: 6 10*3/uL (ref 3.8–10.6)

## 2017-08-16 LAB — COMPREHENSIVE METABOLIC PANEL
ALT: 21 U/L (ref 17–63)
ANION GAP: 22 — AB (ref 5–15)
AST: 35 U/L (ref 15–41)
Albumin: 4.3 g/dL (ref 3.5–5.0)
Alkaline Phosphatase: 71 U/L (ref 38–126)
BILIRUBIN TOTAL: 0.5 mg/dL (ref 0.3–1.2)
BUN: 120 mg/dL — AB (ref 6–20)
CO2: 16 mmol/L — ABNORMAL LOW (ref 22–32)
Calcium: 7 mg/dL — ABNORMAL LOW (ref 8.9–10.3)
Chloride: 98 mmol/L — ABNORMAL LOW (ref 101–111)
Creatinine, Ser: 21.2 mg/dL — ABNORMAL HIGH (ref 0.61–1.24)
GFR calc Af Amer: 3 mL/min — ABNORMAL LOW (ref >60)
GFR, EST NON AFRICAN AMERICAN: 2 mL/min — AB (ref >60)
Glucose, Bld: 203 mg/dL — ABNORMAL HIGH (ref 65–99)
Sodium: 136 mmol/L (ref 135–145)
TOTAL PROTEIN: 8.5 g/dL — AB (ref 6.5–8.1)

## 2017-08-16 LAB — RENAL FUNCTION PANEL
Albumin: 4.3 g/dL (ref 3.5–5.0)
Anion gap: 23 — ABNORMAL HIGH (ref 5–15)
BUN: 128 mg/dL — AB (ref 6–20)
CALCIUM: 7.1 mg/dL — AB (ref 8.9–10.3)
CO2: 17 mmol/L — ABNORMAL LOW (ref 22–32)
CREATININE: 21.08 mg/dL — AB (ref 0.61–1.24)
Chloride: 99 mmol/L — ABNORMAL LOW (ref 101–111)
GFR calc non Af Amer: 2 mL/min — ABNORMAL LOW (ref >60)
GFR, EST AFRICAN AMERICAN: 3 mL/min — AB (ref >60)
GLUCOSE: 130 mg/dL — AB (ref 65–99)
Phosphorus: 4.9 mg/dL — ABNORMAL HIGH (ref 2.5–4.6)
Potassium: 7.1 mmol/L (ref 3.5–5.1)
SODIUM: 139 mmol/L (ref 135–145)

## 2017-08-16 LAB — BASIC METABOLIC PANEL
Anion gap: 19 — ABNORMAL HIGH (ref 5–15)
BUN: 60 mg/dL — AB (ref 6–20)
CALCIUM: 7.9 mg/dL — AB (ref 8.9–10.3)
CHLORIDE: 93 mmol/L — AB (ref 101–111)
CO2: 25 mmol/L (ref 22–32)
CREATININE: 12.12 mg/dL — AB (ref 0.61–1.24)
GFR calc non Af Amer: 5 mL/min — ABNORMAL LOW (ref >60)
GFR, EST AFRICAN AMERICAN: 5 mL/min — AB (ref >60)
GLUCOSE: 129 mg/dL — AB (ref 65–99)
Potassium: 4.3 mmol/L (ref 3.5–5.1)
Sodium: 137 mmol/L (ref 135–145)

## 2017-08-16 LAB — CBC
HCT: 36.2 % — ABNORMAL LOW (ref 40.0–52.0)
Hemoglobin: 12.3 g/dL — ABNORMAL LOW (ref 13.0–18.0)
MCH: 30.1 pg (ref 26.0–34.0)
MCHC: 34 g/dL (ref 32.0–36.0)
MCV: 88.4 fL (ref 80.0–100.0)
PLATELETS: 137 10*3/uL — AB (ref 150–440)
RBC: 4.09 MIL/uL — ABNORMAL LOW (ref 4.40–5.90)
RDW: 19.9 % — AB (ref 11.5–14.5)
WBC: 6.2 10*3/uL (ref 3.8–10.6)

## 2017-08-16 LAB — LACTIC ACID, PLASMA
LACTIC ACID, VENOUS: 2.2 mmol/L — AB (ref 0.5–1.9)
Lactic Acid, Venous: 2.4 mmol/L (ref 0.5–1.9)

## 2017-08-16 LAB — TROPONIN I: TROPONIN I: 0.04 ng/mL — AB (ref <0.03)

## 2017-08-16 LAB — HEMOGLOBIN A1C
Hgb A1c MFr Bld: 5.5 % (ref 4.8–5.6)
Mean Plasma Glucose: 111.15 mg/dL

## 2017-08-16 LAB — MRSA PCR SCREENING: MRSA BY PCR: NEGATIVE

## 2017-08-16 MED ORDER — SODIUM CHLORIDE 0.9 % IV SOLN
1.0000 g | Freq: Once | INTRAVENOUS | Status: AC
  Administered 2017-08-16: 1 g via INTRAVENOUS
  Filled 2017-08-16: qty 10

## 2017-08-16 MED ORDER — PROMETHAZINE HCL 25 MG PO TABS: 12.5000 mg | ORAL_TABLET | Freq: Four times a day (QID) | ORAL | Status: DC | PRN

## 2017-08-16 MED ORDER — SENNOSIDES-DOCUSATE SODIUM 8.6-50 MG PO TABS: 1.0000 | ORAL_TABLET | Freq: Every evening | ORAL | Status: DC | PRN

## 2017-08-16 MED ORDER — OXYCODONE-ACETAMINOPHEN 10-325 MG PO TABS: 1.0000 | ORAL_TABLET | ORAL | Status: DC | PRN

## 2017-08-16 MED ORDER — GABAPENTIN 300 MG PO CAPS
600.0000 mg | ORAL_CAPSULE | Freq: Every day | ORAL | Status: DC
  Administered 2017-08-16: 600 mg via ORAL
  Filled 2017-08-16 (×2): qty 2

## 2017-08-16 MED ORDER — OXYCODONE HCL 5 MG PO TABS: 10.0000 mg | ORAL_TABLET | ORAL | Status: DC | PRN

## 2017-08-16 MED ORDER — ACETAMINOPHEN 325 MG PO TABS: 650.0000 mg | ORAL_TABLET | Freq: Four times a day (QID) | ORAL | Status: DC | PRN

## 2017-08-16 MED ORDER — ALBUTEROL SULFATE (2.5 MG/3ML) 0.083% IN NEBU
5.0000 mg | INHALATION_SOLUTION | Freq: Once | RESPIRATORY_TRACT | Status: AC
  Administered 2017-08-16: 5 mg via RESPIRATORY_TRACT
  Filled 2017-08-16: qty 6

## 2017-08-16 MED ORDER — SODIUM CHLORIDE 0.9 % IV BOLUS
500.0000 mL | Freq: Once | INTRAVENOUS | Status: AC
  Administered 2017-08-16: 500 mL via INTRAVENOUS

## 2017-08-16 MED ORDER — SODIUM BICARBONATE 8.4 % IV SOLN
25.0000 meq | Freq: Once | INTRAVENOUS | Status: AC
  Administered 2017-08-16: 25 meq via INTRAVENOUS
  Filled 2017-08-16: qty 50

## 2017-08-16 MED ORDER — CALCIUM ACETATE (PHOS BINDER) 667 MG PO CAPS
2001.0000 mg | ORAL_CAPSULE | Freq: Three times a day (TID) | ORAL | Status: DC
  Administered 2017-08-16 – 2017-08-17 (×2): 2001 mg via ORAL
  Filled 2017-08-16 (×2): qty 3

## 2017-08-16 MED ORDER — SODIUM POLYSTYRENE SULFONATE 15 GM/60ML PO SUSP
30.0000 g | Freq: Once | ORAL | Status: AC
  Administered 2017-08-16: 30 g via ORAL
  Filled 2017-08-16: qty 120

## 2017-08-16 MED ORDER — CALCIUM CHLORIDE 10 % IV SOLN
1.0000 g | Freq: Once | INTRAVENOUS | Status: DC
  Filled 2017-08-16: qty 30

## 2017-08-16 MED ORDER — HEPARIN SODIUM (PORCINE) 5000 UNIT/ML IJ SOLN
5000.0000 [IU] | Freq: Three times a day (TID) | INTRAMUSCULAR | Status: DC
  Administered 2017-08-16 – 2017-08-17 (×3): 5000 [IU] via SUBCUTANEOUS
  Filled 2017-08-16 (×3): qty 1

## 2017-08-16 MED ORDER — INSULIN ASPART 100 UNIT/ML ~~LOC~~ SOLN
5.0000 [IU] | Freq: Once | SUBCUTANEOUS | Status: AC
  Administered 2017-08-16: 5 [IU] via INTRAVENOUS
  Filled 2017-08-16: qty 1

## 2017-08-16 MED ORDER — CALCIUM GLUCONATE 10 % IV SOLN
1.0000 g | Freq: Once | INTRAVENOUS | Status: AC
  Administered 2017-08-16: 1 g via INTRAVENOUS
  Filled 2017-08-16: qty 10

## 2017-08-16 MED ORDER — MIDODRINE HCL 5 MG PO TABS
5.0000 mg | ORAL_TABLET | Freq: Three times a day (TID) | ORAL | Status: DC
  Administered 2017-08-16 – 2017-08-17 (×2): 5 mg via ORAL
  Filled 2017-08-16 (×2): qty 1

## 2017-08-16 MED ORDER — DEXTROSE 50 % IV SOLN
1.0000 | Freq: Once | INTRAVENOUS | Status: AC
  Administered 2017-08-16: 50 mL via INTRAVENOUS
  Filled 2017-08-16: qty 50

## 2017-08-16 MED ORDER — ALBUTEROL SULFATE (2.5 MG/3ML) 0.083% IN NEBU
15.0000 mg | INHALATION_SOLUTION | Freq: Once | RESPIRATORY_TRACT | Status: DC
  Filled 2017-08-16: qty 18

## 2017-08-16 MED ORDER — ACETAMINOPHEN 325 MG PO TABS: 325.0000 mg | ORAL_TABLET | ORAL | Status: DC | PRN

## 2017-08-16 MED ORDER — BISACODYL 5 MG PO TBEC: 5.0000 mg | DELAYED_RELEASE_TABLET | Freq: Every day | ORAL | Status: DC | PRN

## 2017-08-16 MED ORDER — FENTANYL CITRATE (PF) 100 MCG/2ML IJ SOLN
100.0000 ug | Freq: Once | INTRAMUSCULAR | Status: AC
  Administered 2017-08-16: 100 ug via INTRAVENOUS
  Filled 2017-08-16: qty 2

## 2017-08-16 MED ORDER — ACETAMINOPHEN 650 MG RE SUPP: 650.0000 mg | Freq: Four times a day (QID) | RECTAL | Status: DC | PRN

## 2017-08-16 NOTE — Progress Notes (Addendum)
CRITICAL VALUE ALERT  Critical Value:  Lactic Acid 2.2, improved from 2.4  Date & Time Notied:  08/16/17 1423  Provider Notified: Bridgett Larsson Orders Received/Actions taken: none

## 2017-08-16 NOTE — ED Notes (Signed)
Admitting Provider at bedside. 

## 2017-08-16 NOTE — Progress Notes (Signed)
Patient refusing bed alarm after educating on the importance of it being on. Patient is on oxygen and currently connected to IV. Even after education patient is still refusing. Significant other is at bedside and patient verbalized he will call for assistance. Nursing will continue to monitor.

## 2017-08-16 NOTE — ED Notes (Signed)
Pt states he feels much better and able to move arms

## 2017-08-16 NOTE — Progress Notes (Signed)
Pre HD assessment   08/16/17 0756  Vital Signs  Temp (!) 97.5 F (36.4 C)  Temp Source Oral  Pulse Rate 86  Pulse Rate Source Monitor  Resp 12  BP 107/77  BP Location Left Arm  BP Method Automatic  Patient Position (if appropriate) Lying  Oxygen Therapy  SpO2 95 %  O2 Device Room Air  Dialysis Weight  Weight 113.4 kg (250 lb)  Type of Weight Pre-Dialysis  Time-Out for Hemodialysis  What Procedure? HD  Pt Identifiers(min of two) First/Last Name;MRN/Account#  Correct Site? Yes  Correct Side? Yes  Correct Procedure? Yes  Consents Verified? Yes  Rad Studies Available? N/A  Safety Precautions Reviewed? Yes  Engineer, civil (consulting) Number  ([id number])  Station Number 1  UF/Alarm Test Passed  Conductivity: Meter 13.8  Conductivity: Machine  14  pH 7.4  Reverse Osmosis main  Normal Saline Lot Number [id number]  Dialyzer Lot Number [id number]  Disposable Set Lot Number [id number]  Machine Temperature 98.6 F (37 C)  Musician and Audible Yes  Blood Lines Intact and Secured Yes  Pre Treatment Patient Checks  Vascular access used during treatment Catheter  Hepatitis B Surface Antigen Results Negative  Date Hepatitis B Surface Antigen Drawn 07/03/17  Hepatitis B Surface Antibody 138  Date Hepatitis B Surface Antibody Drawn 07/03/17  Hemodialysis Consent Verified Yes  Hemodialysis Standing Orders Initiated Yes  ECG (Telemetry) Monitor On Yes  Prime Ordered Normal Saline  Length of  DialysisTreatment -hour(s) 4 Hour(s)  Dialyzer Elisio 17H NR  Dialysate 1K  Dialysis Anticoagulant None  Dialysate Flow Ordered 600  Blood Flow Rate Ordered 400 mL/min  Ultrafiltration Goal 5 Liters  Pre Treatment Labs Renal panel  Dialysis Blood Pressure Support Ordered Normal Saline  Education / Care Plan  Dialysis Education Provided Yes  Documented Education in Care Plan Yes

## 2017-08-16 NOTE — Progress Notes (Signed)
[facility]  ROUNDING NOTE   Subjective:   [name] admitted to [facility] on 08/16/2017 for Weakness  Patient has been having complications with his vascular access for hemodialysis. AVF clotted. Permcath placed at [facility] in [facility] yesterday. Unfortunately, patient was unable to get treatment after catheter placed.   Objective:  Vital signs in last 24 hours:  Temp:  [97.9 F (36.6 C)] 97.9 F (36.6 C) (04/25 0059) Pulse Rate:  [88-97] 91 (04/25 0630) Resp:  [11-31] 15 (04/25 0600) BP: (95-128)/(64-91) 113/77 (04/25 0630) SpO2:  [88 %-100 %] 88 % (04/25 0630) Weight:  [113.4 kg (250 lb)] 113.4 kg (250 lb) (04/25 0101)  Weight change:  Filed Weights   08/16/17 0101  Weight: 113.4 kg (250 lb)    Intake/Output: No intake/output data recorded.   Intake/Output this shift:  No intake/output data recorded.  Physical Exam: General: NAD,   Head: Normocephalic, atraumatic. Moist oral mucosal membranes  Eyes: Anicteric, PERRL  Neck: Supple, trachea midline  Lungs:  Clear to auscultation  Heart: Regular rate and rhythm  Abdomen:  Soft, nontender,   Extremities: ++ peripheral edema.  Neurologic: Nonfocal, moving all four extremities  Skin: No lesions  Access: RIJ permcath 2/13    Basic Metabolic Panel: Recent Labs  Lab 08/16/17 0105  NA 136  K >7.5*  CL 98*  CO2 16*  GLUCOSE 203*  BUN 120*  CREATININE 21.20*  CALCIUM 7.0*    Liver Function Tests: Recent Labs  Lab 08/16/17 0105  AST 35  ALT 21  ALKPHOS 71  BILITOT 0.5  PROT 8.5*  ALBUMIN 4.3   No results for input(s): LIPASE, AMYLASE in the last 168 hours. No results for input(s): AMMONIA in the last 168 hours.  CBC: Recent Labs  Lab 08/16/17 0105  WBC 6.0  NEUTROABS 5.6  HGB 12.9*  HCT 39.2*  MCV 89.3  PLT 151    Cardiac Enzymes: Recent Labs  Lab 08/16/17 0104  TROPONINI 0.04*    BNP: Invalid input(s): POCBNP  CBG: No results for input(s): GLUCAP in  the last 168 hours.  Microbiology: Results for orders placed or performed during the hospital encounter of 06/08/17  Surgical pcr screen     Status: Abnormal   Collection Time: 06/08/17 10:33 AM  Result Value Ref Range Status   MRSA, PCR POSITIVE (A) NEGATIVE Final    Comment: RESULT CALLED TO, READ BACK BY AND VERIFIED WITH: EVE SHARP 06/08/17 @ 83  MLK    Staphylococcus aureus POSITIVE (A) NEGATIVE Final    Comment: (NOTE) The Xpert SA Assay (FDA approved for NASAL specimens in patients 35 years of age and older), is one component of a comprehensive surveillance program. It is not intended to diagnose infection nor to guide or monitor treatment. Performed at [facility], [address] [place], [place] [address]   Aerobic/Anaerobic Culture (surgical/deep wound)     Status: None   Collection Time: 06/08/17 12:01 PM  Result Value Ref Range Status   Specimen Description   Final    WOUND RIGHT TOE Performed at [facility], [address], [place], [place] [address]    Special Requests   Final    NONE Performed at [facility], [place], [place] [address]    Gram Stain   Final    FEW WBC PRESENT,BOTH PMN AND MONONUCLEAR NO ORGANISMS SEEN    Culture   Final    RARE METHICILLIN RESISTANT STAPHYLOCOCCUS  AUREUS WITHIN MIXED CULTURE NO ANAEROBES ISOLATED Performed at [facility], [place] [address] [place], [place] [address]    Report Status 06/13/2017 FINAL  Final   Organism ID, Bacteria METHICILLIN RESISTANT STAPHYLOCOCCUS AUREUS  Final      Susceptibility   Methicillin resistant staphylococcus aureus - MIC*    CIPROFLOXACIN >=8 RESISTANT Resistant     ERYTHROMYCIN >=8 RESISTANT Resistant     GENTAMICIN <=0.5 SENSITIVE Sensitive     OXACILLIN >=4 RESISTANT Resistant     TETRACYCLINE <=1 SENSITIVE Sensitive     VANCOMYCIN 1 SENSITIVE Sensitive     TRIMETH/SULFA <=10 SENSITIVE Sensitive     CLINDAMYCIN <=0.25 SENSITIVE  Sensitive     RIFAMPIN <=0.5 SENSITIVE Sensitive     Inducible Clindamycin NEGATIVE Sensitive     * RARE METHICILLIN RESISTANT STAPHYLOCOCCUS AUREUS    Coagulation Studies: No results for input(s): LABPROT, INR in the last 72 hours.  Urinalysis: No results for input(s): COLORURINE, LABSPEC, PHURINE, GLUCOSEU, HGBUR, BILIRUBINUR, KETONESUR, PROTEINUR, UROBILINOGEN, NITRITE, LEUKOCYTESUR in the last 72 hours.  Invalid input(s): APPERANCEUR    Imaging: Dg Chest Port 1 View  Result Date: 08/16/2017 CLINICAL DATA:  Worsening back pain today. New vascular access device placed yesterday. EXAM: PORTABLE CHEST 1 VIEW COMPARISON:  04/21/2017 FINDINGS: The heart size and mediastinal contours are within normal limits. Right IJ dialysis catheter tip is seen in the proximal right atrium. Both lungs are clear. Vascular stent overlies the left subclavian region on this study. The visualized skeletal structures are unremarkable. IMPRESSION: No active disease. Electronically Signed   By: Ashley Royalty M.D.   On: 08/16/2017 01:27     Medications:       Assessment/ Plan:  [name] is a 42 y.o. black male  with end-stage renal disease, hypertension, history of Patrick Downs syndrome admitted to [facility] on 08/16/2017 for Weakness  [address]/ [facility] MWF/ 4.5 hrs/ 110.5 kg  1.  End-stage renal disease with hyperkalemia: emergent hemodialysis today. Last treatment was Saturday due to dialysis device complication.  - 1K bath. Patient seen and examined on hemodialysis. Tolerating treatment well.   2. Hypertension: blood pressure at goal. Volume overload. Monitor with aggressive ultrafiltration.   3. Anemia of chronic kidney disease: hemoglobin 12.9 - [facility] as outpatient.   4. Secondary Hyperparathyroidism: with hypocalcemia - Calcium acetate with meals.    LOS: 0 Patrick Downs 4/25/20197:58 AM

## 2017-08-16 NOTE — ED Notes (Addendum)
Dialysis nurse is coming in to do HD per House supervisior

## 2017-08-16 NOTE — Progress Notes (Signed)
Post HD assessment    08/16/17 1223  Neurological  Level of Consciousness Responds to Voice  Orientation Level Oriented X4  Respiratory  Respiratory Pattern Regular;Unlabored  Chest Assessment Chest expansion symmetrical  Cardiac  ECG Monitor Yes  Vascular  R Radial Pulse +2  L Radial Pulse +2  Integumentary  Integumentary (WDL) X  Skin Color Appropriate for ethnicity  Musculoskeletal  Musculoskeletal (WDL) X  Generalized Weakness Yes  Assistive Device None  GU Assessment  Genitourinary (WDL) X  Genitourinary Symptoms  (HD)  Psychosocial  Psychosocial (WDL) WDL

## 2017-08-16 NOTE — Progress Notes (Signed)
HD tx end   08/16/17 1210  Vital Signs  Pulse Rate 88  Pulse Rate Source Monitor  Resp 10  BP (!) 84/62  BP Location Left Arm  BP Method Automatic  Patient Position (if appropriate) Lying  Oxygen Therapy  SpO2 93 %  O2 Device Nasal Cannula  O2 Flow Rate (L/min) 2 L/min  During Hemodialysis Assessment  Dialysis Fluid Bolus Normal Saline  Bolus Amount (mL) 250 mL  Intra-Hemodialysis Comments Tx completed

## 2017-08-16 NOTE — Progress Notes (Signed)
Post HD assessment. Pt tolerated tx well without c/o. Pt experienced decreased BP throughout tx, drop in O2 sat. requiring pt to be put on 2L [place], MD aware. Net UF 2742, goal not met.    08/16/17 1218  Vital Signs  Temp 97.6 F (36.4 C)  Temp Source Oral  Pulse Rate 90  Pulse Rate Source Monitor  Resp 12  BP (!) 115/95  BP Location Left Arm  BP Method Automatic  Patient Position (if appropriate) Lying  Oxygen Therapy  SpO2 93 %  O2 Device Nasal Cannula  O2 Flow Rate (L/min) 2 L/min  Dialysis Weight  Weight  (unable to weigh pt, no scale available)  Type of Weight Post-Dialysis  Post-Hemodialysis Assessment  Rinseback Volume (mL) 250 mL  KECN 90.2 V  Dialyzer Clearance Lightly streaked  Duration of HD Treatment -hour(s) 4 hour(s)  Hemodialysis Intake (mL) 500 mL  UF Total -Machine (mL) 3242 mL  Net UF (mL) 2742 mL  Tolerated HD Treatment Yes  Education / Care Plan  Dialysis Education Provided Yes  Documented Education in Care Plan Yes

## 2017-08-16 NOTE — ED Notes (Signed)
ED Provider at bedside. 

## 2017-08-16 NOTE — Progress Notes (Addendum)
The patient is admitted this morning, he has no complaints.  Status post hemodialysis today. Vital sign is stable, physical examinations he is unremarkable. Potassium decreased to 4.3 from 7.5 after treatment and hemodialysis. Continue current treatment.  I discussed with the patient, RN and Dr. Juleen China.  Time spent about 26 minutes.

## 2017-08-16 NOTE — Progress Notes (Signed)
Lab informed dialysis that [name] had a pre HD critical potassium level of 7.1.    08/16/17 1144  Hand-Off documentation  Report given to (Full Name) Stark Bray  Report received from (Full Name) Vivi Ferns, [place]

## 2017-08-16 NOTE — ED Notes (Signed)
Report to diaylsis

## 2017-08-16 NOTE — ED Triage Notes (Signed)
Per EMS and Pt he has a chronic debilitating condition starting in 2000 Patrick Downs) and also kidney failure on Dialysis. He had a new vascular access placed yesterday morning. He has had ongoing back pain worsening today. He states he can't get any medication from the pain clinic.  He has a long complicated medical history and receives his nephrology care in [facility] for primary care

## 2017-08-16 NOTE — Progress Notes (Signed)
HD tx start    08/16/17 0803  Vital Signs  Pulse Rate 88  Pulse Rate Source Monitor  Resp 13  BP 118/77  BP Location Left Arm  BP Method Automatic  Patient Position (if appropriate) Lying  Oxygen Therapy  SpO2 92 %  O2 Device Room Air  During Hemodialysis Assessment  Blood Flow Rate (mL/min) 400 mL/min  Arterial Pressure (mmHg) -140 mmHg  Venous Pressure (mmHg) 140 mmHg  Transmembrane Pressure (mmHg) 60 mmHg  Ultrafiltration Rate (mL/min) 1380 mL/min  Dialysate Flow Rate (mL/min) 600 ml/min  Conductivity: Machine  14  HD Safety Checks Performed Yes  Dialysis Fluid Bolus Normal Saline  Bolus Amount (mL) 250 mL  Intra-Hemodialysis Comments Tx initiated

## 2017-08-16 NOTE — H&P (Signed)
[place] [facility] [place] NAME: Patrick Downs    MR#:  [id number]  DATE OF BIRTH:  February 25, 1976  DATE OF ADMISSION:  08/16/2017  PRIMARY CARE PHYSICIAN: Leonel Ramsay, MD   REQUESTING/REFERRING PHYSICIAN: Darel Hong, MD  CHIEF COMPLAINT:   Chief Complaint  Patient presents with  . Back Pain    HISTORY OF PRESENT ILLNESS:  Patrick Downs  is a 42 y.o. male with a known history of HTN, ESRD (HD T/T/S), gout, depression, Hx Guillain Barre Syndrome (2000) p/w weakness/paresis. Pt states that he had hemodialysis on Saturday (08/11/2017). He states that his RUE AV fistula ulcerated, and could not be cannulated for HD on Tuesday (08/14/2017). He states that he had a R chest HD catheter placed on Wednesday (08/15/2017). He endorses fatigue/malaise and generalized/muscle weakness since Tuesday (04/23). He states that on Wednesday (04/24) evening, he was fixing himself something to eat, and attempted to get out of a chair, but felt as though his legs were "weak" and "wobbly". He states he slowly lowered/slid down to the ground. He denies fall or sustaining any frank traumatic injury. He states he was having difficulty moving, and was concerned for recurrence of Guillain Barre Syndrome. At the time of my Hx/examination, he endorses chronic back pain that is acutely worse x2d, but states he feels generally better since coming to the ED, and is otherwise w/o complaint. He denies CP/SOB, but states that he was breathing "differently" before coming to the ED. K+ in ED > 7.5. (-) F/C/N/V/D/AP, palpitations, diaphoresis, rigors, night sweats, cough, hemoptysis, wheezing, HA, blurred vision, vertigo, LH/LOC. Pt does not make any urine.  PAST MEDICAL HISTORY:   Past Medical History:  Diagnosis Date  . Depression   . Dialysis patient ([place])   . ESRD (end stage renal disease) on dialysis ([place])   . Failure to thrive in adult   . Gout   . Guillain Barr syndrome  ([place])   . HTN (hypertension)   . Kidney failure   . Pneumonia   . Renal insufficiency   . Respiratory failure ([place])     PAST SURGICAL HISTORY:   Past Surgical History:  Procedure Laterality Date  . AMPUTATION TOE Right 06/08/2017   Procedure: AMPUTATION TOE RIGHT FIFTH TOE;  Surgeon: Samara Deist, DPM;  Location: [facility] ORS;  Service: Podiatry;  Laterality: Right;  . AV FISTULA PLACEMENT     x5      2 graphs  . PARATHYROIDECTOMY    . RENAL BIOPSY    . tonsiilectomy    . tracheotomy      SOCIAL HISTORY:   Social History   Tobacco Use  . Smoking status: Never Smoker  . Smokeless tobacco: Never Used  Substance Use Topics  . Alcohol use: No    FAMILY HISTORY:   Family History  Problem Relation Age of Onset  . Diabetes Mellitus II Father   . Kidney disease Father   . Kidney failure Paternal Grandfather   . Prostate cancer Neg Hx   . Kidney cancer Neg Hx   . Bladder Cancer Neg Hx     DRUG ALLERGIES:   Allergies  Allergen Reactions  . Ivp Dye [Iodinated Diagnostic Agents] Other (See Comments)    Reaction unknown  . Ondansetron Other (See Comments)    Stomach pain   . Minoxidil Other (See Comments)    "put fluid around my heart", PERICARDIAL EFFUSION  . Morphine Other (See Comments)    Makes  patient aggressive  . Morphine And Related Other (See Comments)    Reaction unknown  . Omnipaque [Iohexol] Itching and Other (See Comments)    Rigors on one occasion, widespread itching on a separate occasion (resolved with Benadryl), tremors    REVIEW OF SYSTEMS:   Review of Systems  Constitutional: Positive for malaise/fatigue. Negative for chills, diaphoresis, fever and weight loss.  HENT: Negative for congestion, hearing loss, nosebleeds, sore throat and tinnitus.   Eyes: Negative for blurred vision, double vision and photophobia.  Respiratory: Negative for cough, hemoptysis, sputum production, shortness of breath and wheezing.   Cardiovascular: Negative for chest  pain, palpitations, orthopnea, claudication, leg swelling and PND.  Gastrointestinal: Negative for abdominal pain, blood in stool, constipation, diarrhea, heartburn, melena, nausea and vomiting.  Genitourinary: Negative for dysuria, frequency, hematuria and urgency.  Musculoskeletal: Positive for back pain. Negative for falls, joint pain, myalgias and neck pain.  Skin: Negative for itching and rash.  Neurological: Positive for weakness (+) generalized weakness. Negative for dizziness, tingling, tremors, sensory change, speech change, focal weakness, seizures, loss of consciousness and headaches.    MEDICATIONS AT HOME:   Prior to Admission medications   Medication Sig Start Date End Date Taking? Authorizing Provider  calcium acetate (PHOSLO) 667 MG capsule Take 3 capsules (2,001 mg total) by mouth 3 (three) times daily with meals. 08/26/14  Yes Wieting, Richard, MD  gabapentin (NEURONTIN) 300 MG capsule Take 600 mg by mouth at bedtime.    Yes [provider]  midodrine (PROAMATINE) 5 MG tablet Take 1 tablet (5 mg total) by mouth 3 (three) times daily with meals. 11/28/14  Yes Theodoro Grist, MD  oxyCODONE-acetaminophen (PERCOCET) 10-325 MG tablet Take 1 tablet by mouth every 4 (four) hours as needed for pain.  03/21/17  Yes [provider]  sildenafil (REVATIO) 20 MG tablet Take 20-100 mg by mouth as needed (for ED).  03/02/17  Yes [provider]  vancomycin 500 mg in sodium chloride 0.9 % 100 mL Inject 500 mg into the vein every dialysis. Patient not taking: Reported on 08/16/2017 06/03/17   Darel Hong, MD      VITAL SIGNS:  Blood pressure 103/77, pulse 97, temperature 97.9 F (36.6 C), temperature source Oral, resp. rate 20, height 6\' 2"  (1.88 m), weight 113.4 kg (250 lb), SpO2 98 %.  PHYSICAL EXAMINATION:  Physical Exam  Constitutional: He is oriented to person, place, and time. He appears well-developed and well-nourished. No distress.  HENT:  Head:  Normocephalic and atraumatic.  Eyes: Pupils are equal, round, and reactive to light. Conjunctivae and EOM are normal. No scleral icterus.  Neck: Neck supple. No JVD present. No thyromegaly present.  Cardiovascular: Normal rate, regular rhythm, S1 normal, S2 normal and normal heart sounds.  No extrasystoles are present. Exam reveals no gallop, no S3, no S4, no distant heart sounds and no friction rub.  No murmur heard. Pulmonary/Chest: Effort normal and breath sounds normal. No stridor. No respiratory distress. He has no decreased breath sounds. He has no wheezes. He has no rhonchi. He has no rales.  (+) R chest HD catheter.  Abdominal: Soft. Bowel sounds are normal. He exhibits no distension. There is no tenderness. There is no rebound and no guarding.  Musculoskeletal: He exhibits no edema or tenderness.  Lymphadenopathy:    He has no cervical adenopathy.  Neurological: He is alert and oriented to person, place, and time.  Skin: Skin is warm and dry. No rash noted. He is not diaphoretic.  No erythema.  Psychiatric: He has a normal mood and affect. His behavior is normal. Judgment and thought content normal.    LABORATORY PANEL:   CBC Recent Labs  Lab 08/16/17 0105  WBC 6.0  HGB 12.9*  HCT 39.2*  PLT 151   ------------------------------------------------------------------------------------------------------------------  Chemistries  Recent Labs  Lab 08/16/17 0105  NA 136  K >7.5*  CL 98*  CO2 16*  GLUCOSE 203*  BUN 120*  CREATININE 21.20*  CALCIUM 7.0*  AST 35  ALT 21  ALKPHOS 71  BILITOT 0.5   ------------------------------------------------------------------------------------------------------------------  Cardiac Enzymes Recent Labs  Lab 08/16/17 0104  TROPONINI 0.04*   ------------------------------------------------------------------------------------------------------------------  RADIOLOGY:  Dg Chest Port 1 View  Result Date: 08/16/2017 CLINICAL DATA:   Worsening back pain today. New vascular access device placed yesterday. EXAM: PORTABLE CHEST 1 VIEW COMPARISON:  04/21/2017 FINDINGS: The heart size and mediastinal contours are within normal limits. Right IJ dialysis catheter tip is seen in the proximal right atrium. Both lungs are clear. Vascular stent overlies the left subclavian region on this study. The visualized skeletal structures are unremarkable. IMPRESSION: No active disease. Electronically Signed   By: Ashley Royalty M.D.   On: 08/16/2017 01:27   IMPRESSION AND PLAN:   A/P: 72M w/ ESRD/missed HD, hyperkalemia, uremia/azotemia.  1.) ESRD/Missed HD w/ hyperkalemia + uremia/azotemia: Pt w/ 1x missed HD session (Tues 04/23), last HD session Sat 04/20. HD missed 2/2 access problem (discussed below). K+ > 7.5. BUN 120, Cr 21.2. EKG (+) wide QRS, IVCD. Ordered for calcium, insulin + dextrose, bicarbonate, albuterol, kayexalate. F/up K+ level. Nephrology consult for HD ASAP. C/w PhosLo.  2.) Failed HD access: Pt w/ RUE AV fistula w/ ulceration. Pt s/p R chest HD catheter. Consider Vascular Surgery consultation for long-term access.  3.) Hyperglycemia: Glucose 203. HbA1c pending.  4.) Troponin elevation: Trop-I 0.05. Elevation likely 2/2 impaired renal clearance. (-) CP/SOB. Tele, monitor.  5.) FEN/GI: Renal diet.  6.) DVT PPx: Heparin 5000u SQ TID.  7.) Code status: Full code.  8.) Disposition: Admission, pt expected to stay > 2 midnights.   All the records are reviewed and case discussed with ED provider. Management plans discussed with the patient, family and they are in agreement.  CODE STATUS: Full code.  TOTAL TIME TAKING CARE OF THIS PATIENT: 90 minutes.    Arta Silence M.D on 08/16/2017 at 4:43 AM  Between 7am to 6pm - Pager - [phone]  After 6pm go to [email or web address] - Proofreader  [facility]  Office  [phone]  CC: Primary care physician; Leonel Ramsay, MD   Note:  This dictation was prepared with Dragon dictation along with smaller phrase technology. Any transcriptional errors that result from this process are unintentional.

## 2017-08-16 NOTE — Progress Notes (Signed)
MD made aware of patient's low blood pressure. See chart. Patient asymptomatic, sleeping in bed. Will continue to monitor patient.

## 2017-08-16 NOTE — ED Notes (Signed)
Dialysis called stating they are ready for patient to be brought down. Dialysis notified of patient's assigned room for after treatment.

## 2017-08-16 NOTE — ED Provider Notes (Signed)
[facility] Emergency Department Provider Note  ____________________________________________   First MD Initiated Contact with Patient 08/16/17 [phone]     (approximate)  I have reviewed the triage vital signs and the nursing notes.   HISTORY  Chief Complaint Back Pain   HPI Patrick Downs is a 42 y.o. male who comes to the emergency department via EMS with 1 day of progressive severe low back pain as well as proximal muscle weakness in his legs.  He is concerned that he may be having a recurrent episode of Guyon Barr syndrome.  He has a past medical history of end-stage renal disease for which he receives hemodialysis Saturday Tuesday and Thursday.  He missed his last dialysis.  He previously got dialysis via a fistula in the right upper extremity however his fistula is no longer functional and yesterday he had a Vas-Cath placed in his right chest.  His symptoms have been gradual onset slowly progressive for now moderate to severe.  He denies fevers or chills.  His back pain is nonradiating.  Past Medical History:  Diagnosis Date  . Depression   . Dialysis patient ([place])   . ESRD (end stage renal disease) on dialysis ([place])   . Failure to thrive in adult   . Gout   . Guillain Barr syndrome ([place])   . HTN (hypertension)   . Kidney failure   . Pneumonia   . Renal insufficiency   . Respiratory failure [facility])     Patient Active Problem List   Diagnosis Date Noted  . Hyperkalemia 08/16/2017  . Rectal pain 11/27/2014  . Encounter for imaging study to confirm nasogastric (NG) tube placement   . Encounter for nasogastric (NG) tube placement   . Respiratory failure, acute ([place])   . Guillain Barr syndrome, lower ext weakness 08/27/2014  . CAP (community acquired pneumonia) 08/23/2014  . ESRD on dialysis ([place]) 08/23/2014  . A-V fistula ([place]) 08/23/2014  . ARDS (adult respiratory distress syndrome) ([place]) 08/23/2014  . Septic shock ([place]) 08/23/2014  .  Hypotension 08/23/2014  . Acute respiratory failure ([place]) 08/23/2014  . Fever   . Sepsis ([place]) 08/22/2014    Past Surgical History:  Procedure Laterality Date  . AMPUTATION TOE Right 06/08/2017   Procedure: AMPUTATION TOE RIGHT FIFTH TOE;  Surgeon: Samara Deist, DPM;  Location: [facility] ORS;  Service: Podiatry;  Laterality: Right;  . AV FISTULA PLACEMENT     x5      2 graphs  . PARATHYROIDECTOMY    . RENAL BIOPSY    . tonsiilectomy    . tracheotomy      Prior to Admission medications   Medication Sig Start Date End Date Taking? Authorizing Provider  calcium acetate (PHOSLO) 667 MG capsule Take 3 capsules (2,001 mg total) by mouth 3 (three) times daily with meals. 08/26/14  Yes Wieting, Richard, MD  gabapentin (NEURONTIN) 300 MG capsule Take 600 mg by mouth at bedtime.    Yes [provider]  midodrine (PROAMATINE) 5 MG tablet Take 1 tablet (5 mg total) by mouth 3 (three) times daily with meals. 11/28/14  Yes Theodoro Grist, MD  oxyCODONE-acetaminophen (PERCOCET) 10-325 MG tablet Take 1 tablet by mouth every 4 (four) hours as needed for pain.  03/21/17  Yes [provider]  sildenafil (REVATIO) 20 MG tablet Take 20-100 mg by mouth as needed (for ED).  03/02/17  Yes [provider]  vancomycin 500 mg in sodium chloride 0.9 % 100 mL Inject 500 mg into  the vein every dialysis. Patient not taking: Reported on 08/16/2017 06/03/17   Darel Hong, MD    Allergies Ivp dye [iodinated diagnostic agents]; Ondansetron; Minoxidil; Morphine; Morphine and related; and Omnipaque [iohexol]  Family History  Problem Relation Age of Onset  . Diabetes Mellitus II Father   . Kidney disease Father   . Kidney failure Paternal Grandfather   . Prostate cancer Neg Hx   . Kidney cancer Neg Hx   . Bladder Cancer Neg Hx     Social History Social History   Tobacco Use  . Smoking status: Never Smoker  . Smokeless tobacco: Never Used  Substance Use Topics  . Alcohol use: No  .  Drug use: Yes    Types: Marijuana    Review of Systems Constitutional: No fever/chills Eyes: No visual changes. ENT: No sore throat. Cardiovascular: Denies chest pain. Respiratory: Denies shortness of breath. Gastrointestinal: No abdominal pain.  No nausea, no vomiting.  No diarrhea.  No constipation. Genitourinary: Negative for dysuria. Musculoskeletal: Positive for back pain. Skin: Negative for rash. Neurological: Positive for leg weakness   ____________________________________________   PHYSICAL EXAM:  VITAL SIGNS: ED Triage Vitals  Enc Vitals Group     BP      Pulse      Resp      Temp      Temp src      SpO2      Weight      Height      Head Circumference      Peak Flow      Pain Score      Pain Loc      Pain Edu?      Excl. in [place]?     Constitutional: Alert and oriented x4 appears anxious and tearful Eyes: PERRL EOMI. Head: Atraumatic. Nose: No congestion/rhinnorhea. Mouth/Throat: No trismus Neck: No stridor.   Cardiovascular: Normal rate, regular rhythm. Grossly normal heart sounds.  Good peripheral circulation. Respiratory: Increased respiratory effort.  No retractions. Lungs CTAB and moving good air Gastrointestinal: Soft nontender Musculoskeletal: No lower extremity edema Vas-Cath in place to right chest Neurologic: 2 out of 5 hip flexion and hip extension bilaterally Skin:  Skin is warm, dry and intact. No rash noted. Psychiatric: Anxious appearing   ____________________________________________   DIFFERENTIAL includes but not limited to  Hyperkalemia, dehydration, Guyon Barr syndrome, musculoskeletal pain ____________________________________________   LABS (all labs ordered are listed, but only abnormal results are displayed)  Labs Reviewed  COMPREHENSIVE METABOLIC PANEL - Abnormal; Notable for the following components:      Result Value   Potassium >7.5 (*)    Chloride 98 (*)    CO2 16 (*)    Glucose, Bld 203 (*)    BUN 120 (*)     Creatinine, Ser 21.20 (*)    Calcium 7.0 (*)    Total Protein 8.5 (*)    GFR calc non Af Amer 2 (*)    GFR calc Af Amer 3 (*)    Anion gap 22 (*)    All other components within normal limits  CBC WITH DIFFERENTIAL/PLATELET - Abnormal; Notable for the following components:   RBC 4.39 (*)    Hemoglobin 12.9 (*)    HCT 39.2 (*)    RDW 20.4 (*)    Lymphs Abs 0.4 (*)    Monocytes Absolute 0.0 (*)    All other components within normal limits  TROPONIN I - Abnormal; Notable for the following components:   Troponin I  0.04 (*)    All other components within normal limits  LACTIC ACID, PLASMA - Abnormal; Notable for the following components:   Lactic Acid, Venous 2.4 (*)    All other components within normal limits  LACTIC ACID, PLASMA  HEMOGLOBIN A1C  RENAL FUNCTION PANEL    Lab work reviewed by me with a number of life-threatening abnormalities most notably and on measurably high potassium __________________________________________  EKG  ____________________________  RADIOLOGY  Chest x-ray reviewed by me with no acute disease ____________________________________________   PROCEDURES  Procedure(s) performed: yes  Angiocath insertion Performed by: Darel Hong  Consent: Verbal consent obtained. Risks and benefits: risks, benefits and alternatives were discussed Time out: Immediately prior to procedure a "time out" was called to verify the correct patient, procedure, equipment, support staff and site/side marked as required.  Preparation: Patient was prepped and draped in the usual sterile fashion.  Vein Location: left forearm  Ultrasound Guided  Gauge: 20  Normal blood return and flush without difficulty Patient tolerance: Patient tolerated the procedure well with no immediate complications.     .Critical Care Performed by: Darel Hong, MD Authorized by: Darel Hong, MD   Critical care provider statement:    Critical care time (minutes):  45    Critical care time was exclusive of:  Separately billable procedures and treating other patients   Critical care was necessary to treat or prevent imminent or life-threatening deterioration of the following conditions:  Metabolic crisis   Critical care was time spent personally by me on the following activities:  Development of treatment plan with patient or surrogate, discussions with consultants, evaluation of patient's response to treatment, examination of patient, obtaining history from patient or surrogate, ordering and performing treatments and interventions, ordering and review of laboratory studies, ordering and review of radiographic studies, pulse oximetry, re-evaluation of patient's condition and review of old charts    Critical Care performed: Yes  Observation: no ____________________________________________   INITIAL IMPRESSION / ASSESSMENT AND PLAN / ED COURSE  Pertinent labs & imaging results that were available during my care of the patient were reviewed by me and considered in my medical decision making (see chart for details).  ----------------------------------------- 1:08 AM on 08/16/2017 -----------------------------------------  The patient's EKG on arrival shows a nonspecific intraventricular conduction delay with a prolonged QTC.  I have a high clinical suspicion for hyperkalemia as the patient normally has narrow complex and he has missed dialysis.    ----------------------------------------- 2:42 AM on 08/16/2017 -----------------------------------------  On the monitor the patient's rhythm is now narrow complex.  His pain is under control and he now has full strength in bilateral hips.  His K came back greater than 7.5.  Nephrology has been paged for emergent dialysis.  I discussed with the patient who verbalizes understanding and agreement with inpatient admission.  I then discussed with the hospitalist who has graciously agreed to admit the patient to his  service.  ____________________________________________   FINAL CLINICAL IMPRESSION(S) / ED DIAGNOSES  Final diagnoses:  Hyperkalemia  Weakness  Azotemia      NEW MEDICATIONS STARTED DURING THIS VISIT:  New Prescriptions   No medications on file     Note:  This document was prepared using Dragon voice recognition software and may include unintentional dictation errors.     Darel Hong, MD 08/16/17 [phone]

## 2017-08-16 NOTE — Progress Notes (Signed)
Pre HD assessment    08/16/17 0757  Neurological  Level of Consciousness Alert  Orientation Level Oriented X4  Respiratory  Respiratory Pattern Regular;Unlabored  Chest Assessment Chest expansion symmetrical  Cardiac  ECG Monitor Yes  Vascular  R Radial Pulse +2  L Radial Pulse +2  Integumentary  Integumentary (WDL) X  Skin Color Appropriate for ethnicity  Musculoskeletal  Musculoskeletal (WDL) X  Generalized Weakness Yes  Assistive Device None  GU Assessment  Genitourinary (WDL) X  Genitourinary Symptoms  (HD)  Psychosocial  Psychosocial (WDL) WDL

## 2017-08-16 NOTE — ED Notes (Signed)
Call to nursing supervisor. She will call nephrology and see if

## 2017-08-16 NOTE — ED Notes (Signed)
Pt standing at side of the bed.

## 2017-08-17 DIAGNOSIS — E875 Hyperkalemia: Secondary | ICD-10-CM | POA: Diagnosis not present

## 2017-08-17 LAB — CBC
HCT: 34.2 % — ABNORMAL LOW (ref 40.0–52.0)
Hemoglobin: 11.3 g/dL — ABNORMAL LOW (ref 13.0–18.0)
MCH: 29.6 pg (ref 26.0–34.0)
MCHC: 33 g/dL (ref 32.0–36.0)
MCV: 89.6 fL (ref 80.0–100.0)
PLATELETS: 134 10*3/uL — AB (ref 150–440)
RBC: 3.82 MIL/uL — ABNORMAL LOW (ref 4.40–5.90)
RDW: 20.5 % — AB (ref 11.5–14.5)
WBC: 6 10*3/uL (ref 3.8–10.6)

## 2017-08-17 LAB — RENAL FUNCTION PANEL
Albumin: 3.7 g/dL (ref 3.5–5.0)
Anion gap: 18 — ABNORMAL HIGH (ref 5–15)
BUN: 87 mg/dL — ABNORMAL HIGH (ref 6–20)
CO2: 25 mmol/L (ref 22–32)
Calcium: 6.6 mg/dL — ABNORMAL LOW (ref 8.9–10.3)
Chloride: 96 mmol/L — ABNORMAL LOW (ref 101–111)
Creatinine, Ser: 15.12 mg/dL — ABNORMAL HIGH (ref 0.61–1.24)
GFR calc Af Amer: 4 mL/min — ABNORMAL LOW
GFR calc non Af Amer: 3 mL/min — ABNORMAL LOW
Glucose, Bld: 136 mg/dL — ABNORMAL HIGH (ref 65–99)
Phosphorus: 8.2 mg/dL — ABNORMAL HIGH (ref 2.5–4.6)
Potassium: 4.8 mmol/L (ref 3.5–5.1)
Sodium: 139 mmol/L (ref 135–145)

## 2017-08-17 LAB — HIV ANTIBODY (ROUTINE TESTING W REFLEX): HIV Screen 4th Generation wRfx: NONREACTIVE

## 2017-08-17 MED ORDER — TRAMADOL HCL 50 MG PO TABS
50.0000 mg | ORAL_TABLET | Freq: Two times a day (BID) | ORAL | Status: DC | PRN
  Administered 2017-08-17: 50 mg via ORAL
  Filled 2017-08-17: qty 1

## 2017-08-17 NOTE — Progress Notes (Signed)
Post HD assessment, report given to floor RN. Pt denies complaint no changes from initial assessment.    08/17/17 1320  Neurological  Level of Consciousness Alert  Orientation Level Oriented X4  Respiratory  Respiratory Pattern Regular;Unlabored  Chest Assessment Chest expansion symmetrical  Cough None  Cardiac  Pulse Regular  Heart Sounds S1, S2  ECG Monitor Yes  Vascular  R Radial Pulse +1  L Radial Pulse +1  Edema Generalized  Psychosocial  Psychosocial (WDL) WDL

## 2017-08-17 NOTE — Discharge Instructions (Signed)
Renal diet

## 2017-08-17 NOTE — Progress Notes (Signed)
[facility]  ROUNDING NOTE   Subjective:   Seen and examined on hemodialysis. Tolerating treatment well.     HEMODIALYSIS FLOWSHEET:  Blood Flow Rate (mL/min): 400 mL/min Arterial Pressure (mmHg): -160 mmHg Venous Pressure (mmHg): 150 mmHg Transmembrane Pressure (mmHg): 80 mmHg Ultrafiltration Rate (mL/min): 1500 mL/min Dialysate Flow Rate (mL/min): 600 ml/min Conductivity: Machine : 14.1 Conductivity: Machine : 14.1 Dialysis Fluid Bolus: Normal Saline Bolus Amount (mL): 250 mL    Objective:  Vital signs in last 24 hours:  Temp:  [97.4 F (36.3 C)-98.3 F (36.8 C)] 97.6 F (36.4 C) (04/26 0947) Pulse Rate:  [73-96] 76 (04/26 1215) Resp:  [9-28] 10 (04/26 1215) BP: (71-118)/(43-80) 92/59 (04/26 1215) SpO2:  [93 %-100 %] 93 % (04/26 1215) Weight:  [118.8 kg (261 lb 12.8 oz)-120.4 kg (265 lb 6.9 oz)] 120.4 kg (265 lb 6.9 oz) (04/26 0947)  Weight change: 0.001 kg () Filed Weights   08/16/17 1800 08/17/17 0433 08/17/17 0947  Weight: 118.8 kg (261 lb 12.8 oz) 120.2 kg (265 lb 1.6 oz) 120.4 kg (265 lb 6.9 oz)    Intake/Output: I/O last 3 completed shifts: In: 500 [IV FTDDUKGUR:427] Out: 0623 [Urine:500; Other:2742]   Intake/Output this shift:  Total I/O In: 480 [P.O.:480] Out: -   Physical Exam: General: NAD,   Head: Normocephalic, atraumatic. Moist oral mucosal membranes  Eyes: Anicteric, PERRL  Neck: Supple, trachea midline  Lungs:  Clear to auscultation  Heart: Regular rate and rhythm  Abdomen:  Soft, nontender,   Extremities: no peripheral edema.  Neurologic: Nonfocal, moving all four extremities  Skin: No lesions  Access: RIJ permcath 7/62    Basic Metabolic Panel: Recent Labs  Lab 08/16/17 0105 08/16/17 0849 08/16/17 1346 08/17/17 0713  NA 136 139 137 139  K >7.5* 7.1* 4.3 4.8  CL 98* 99* 93* 96*  CO2 16* 17* 25 25  GLUCOSE 203* 130* 129* 136*  BUN 120* 128* 60* 87*  CREATININE 21.20* 21.08* 12.12* 15.12*  CALCIUM 7.0* 7.1*  7.9* 6.6*  PHOS  --  4.9*  --  8.2*    Liver Function Tests: Recent Labs  Lab 08/16/17 0105 08/16/17 0849 08/17/17 0713  AST 35  --   --   ALT 21  --   --   ALKPHOS 71  --   --   BILITOT 0.5  --   --   PROT 8.5*  --   --   ALBUMIN 4.3 4.3 3.7   No results for input(s): LIPASE, AMYLASE in the last 168 hours. No results for input(s): AMMONIA in the last 168 hours.  CBC: Recent Labs  Lab 08/16/17 0105 08/16/17 1346 08/17/17 0736  WBC 6.0 6.2 6.0  NEUTROABS 5.6  --   --   HGB 12.9* 12.3* 11.3*  HCT 39.2* 36.2* 34.2*  MCV 89.3 88.4 89.6  PLT 151 137* 134*    Cardiac Enzymes: Recent Labs  Lab 08/16/17 0104  TROPONINI 0.04*    BNP: Invalid input(s): POCBNP  CBG: No results for input(s): GLUCAP in the last 168 hours.  Microbiology: Results for orders placed or performed during the hospital encounter of 08/16/17  MRSA PCR Screening     Status: None   Collection Time: 08/16/17 12:51 AM  Result Value Ref Range Status   MRSA by PCR NEGATIVE NEGATIVE Final    Comment:        The GeneXpert MRSA Assay (FDA approved for NASAL specimens only), is one component of a comprehensive MRSA colonization surveillance  program. It is not intended to diagnose MRSA infection nor to guide or monitor treatment for MRSA infections. Performed at [facility], [address] [place], [place] [address]     Coagulation Studies: No results for input(s): LABPROT, INR in the last 72 hours.  Urinalysis: No results for input(s): COLORURINE, LABSPEC, PHURINE, GLUCOSEU, HGBUR, BILIRUBINUR, KETONESUR, PROTEINUR, UROBILINOGEN, NITRITE, LEUKOCYTESUR in the last 72 hours.  Invalid input(s): APPERANCEUR    Imaging: Dg Chest Port 1 View  Result Date: 08/16/2017 CLINICAL DATA:  Worsening back pain today. New vascular access device placed yesterday. EXAM: PORTABLE CHEST 1 VIEW COMPARISON:  04/21/2017 FINDINGS: The heart size and mediastinal contours are within normal limits.  Right IJ dialysis catheter tip is seen in the proximal right atrium. Both lungs are clear. Vascular stent overlies the left subclavian region on this study. The visualized skeletal structures are unremarkable. IMPRESSION: No active disease. Electronically Signed   By: Ashley Royalty M.D.   On: 08/16/2017 01:27     Medications:       Assessment/ Plan:  [name] is a 42 y.o. black male  with end-stage renal disease, hypertension, history of Iline Oven syndrome admitted to [facility] on 08/16/2017 for Hyperkalemia [E87.5] Weakness [R53.1] Azotemia [R79.89]  [address]/ [facility] MWF/ 4.5 hrs  1.  End-stage renal disease with hyperkalemia: emergent hemodialysis on admission.  - Seen and examined on hemodialysis. Tolerating treatment well.   2. Hypotensive:  - midodrine   3. Anemia of chronic kidney disease: hemoglobin 11.3 - Mircera as outpatient.   4. Secondary Hyperparathyroidism: with hypocalcemia - Calcium acetate with meals.    LOS: 1 Patrick Downs 4/26/201912:33 PM

## 2017-08-17 NOTE — Progress Notes (Signed)
Pre HD Assessment   08/17/17 0940  Neurological  Level of Consciousness Alert  Orientation Level Oriented X4  Respiratory  Respiratory Pattern Regular;Unlabored  Chest Assessment Chest expansion symmetrical  Cough None  Cardiac  Pulse Regular  Heart Sounds S1, S2  ECG Monitor Yes  Vascular  R Radial Pulse +1  L Radial Pulse +1  Edema Generalized  Psychosocial  Psychosocial (WDL) WDL

## 2017-08-17 NOTE — Plan of Care (Signed)
Patient refuses bed alarm. Encourage patient to adhere to safety measures.

## 2017-08-17 NOTE — Progress Notes (Signed)
Post HD Tx    08/17/17 1318  Vital Signs  Temp 98.2 F (36.8 C)  Temp Source Oral  Pulse Rate 72  Pulse Rate Source Monitor  Resp 16  BP (!) 89/61  BP Location Left Arm  BP Method Automatic  Patient Position (if appropriate) Lying  Oxygen Therapy  SpO2 97 %  O2 Device Room Air  Pulse Oximetry Type Continuous  Post-Hemodialysis Assessment  Rinseback Volume (mL) 250 mL  Dialyzer Clearance Lightly streaked  Duration of HD Treatment -hour(s) 3.5 hour(s)  Hemodialysis Intake (mL) 500 mL  UF Total -Machine (mL) 4536 mL  Net UF (mL) 4036 mL  Tolerated HD Treatment Yes  Hemodialysis Catheter Right Subclavian  Placement Date/Time: 08/15/17 0100   Placed prior to admission: Yes  Orientation: Right  Access Location: Subclavian  Site Condition No complications  Post treatment catheter status Capped and Clamped

## 2017-08-17 NOTE — Care Management (Signed)
No discharge needs identified by members of the care team.  Notified Elvera Bicker with [facility] of DC.

## 2017-08-17 NOTE — Progress Notes (Signed)
Patient given discharge instructions. Tele monitor taken off and IV taken out. Patient verbalized understanding with no further questions. Patient will be transported home via family vehicle.

## 2017-08-17 NOTE — Progress Notes (Signed)
Patient's blood pressure has remained in the 76'L systolic. Patient states his blood pressure remains in the 90's. Patient has received a 500cc bolus. Patient has complained of chronic lower back pain, offered tylenol. Will notify MD about current condition.

## 2017-08-17 NOTE — Progress Notes (Signed)
Patient back from dialysis. Vital signs WDL. Patient eating lunch. Will continue to monitor.

## 2017-08-17 NOTE — Progress Notes (Signed)
HD Tx ended    08/17/17 1315  Vital Signs  Pulse Rate 72  Resp 13  BP 94/70  Oxygen Therapy  SpO2 95 %  During Hemodialysis Assessment  HD Safety Checks Performed Yes  Dialysis Fluid Bolus Normal Saline  Bolus Amount (mL) 250 mL  Intra-Hemodialysis Comments Tx completed;Tolerated well

## 2017-08-17 NOTE — Progress Notes (Signed)
Pre HD TX  08/17/17 0947  Vital Signs  Temp 97.6 F (36.4 C)  Temp Source Oral  Pulse Rate 74  Pulse Rate Source Monitor  Resp 20  BP 111/75  BP Location Left Arm  BP Method Automatic  Patient Position (if appropriate) Lying  Oxygen Therapy  SpO2 97 %  O2 Device Room Air  Pulse Oximetry Type Continuous  Pain Assessment  Pain Scale 0-10  Pain Score 0  Dialysis Weight  Weight 120.4 kg (265 lb 6.9 oz)  Type of Weight Pre-Dialysis  Time-Out for Hemodialysis  What Procedure? HD  Pt Identifiers(min of two) First/Last Name;MRN/Account#  Correct Site? Yes  Correct Side? Yes  Correct Procedure? Yes  Consents Verified? Yes  Rad Studies Available? N/A  Safety Precautions Reviewed? Yes  [employer] Number [id number]  Station Number 2  UF/Alarm Test Passed  Conductivity: Meter 14  Conductivity: Machine  14  pH 7.4  Reverse Osmosis Main  Normal Saline Lot Number [id number]  Dialyzer Lot Number [id number]  Disposable Set Lot Number [id number]  Machine Temperature 98.6 F (37 C)  Musician and Audible Yes  Blood Lines Intact and Secured Yes  Pre Treatment Patient Checks  Vascular access used during treatment Catheter  Hepatitis B Surface Antigen Results Negative  Date Hepatitis B Surface Antigen Drawn 07/03/17  Hepatitis B Surface Antibody 138  Date Hepatitis B Surface Antibody Drawn 07/03/17  Hemodialysis Consent Verified Yes  Hemodialysis Standing Orders Initiated Yes  ECG (Telemetry) Monitor On Yes  Prime Ordered Normal Saline  Length of  DialysisTreatment -hour(s) 3.5 Hour(s)  Dialysis Treatment Comments Na 140  Dialyzer Elisio 17H NR  Dialysate 3K, 2.5 Ca  Dialysis Anticoagulant None  Dialysate Flow Ordered 600  Blood Flow Rate Ordered 400 mL/min  Ultrafiltration Goal 1.5 Liters  Dialysis Blood Pressure Support Ordered Normal Saline  Education / Care Plan  Dialysis Education Provided Yes  Documented Education in Care Plan Yes  Hemodialysis Catheter  Right Subclavian  Placement Date/Time: 08/15/17 0100   Placed prior to admission: Yes  Orientation: Right  Access Location: Subclavian  Site Condition No complications  Blue Lumen Status Flushed  Red Lumen Status Flushed  Dressing Type Biopatch  Dressing Status Clean;Dry;Intact  Drainage Description None

## 2017-08-17 NOTE — Discharge Summary (Signed)
[place] [facility] [place] NAME: Patrick Downs    MR#:  [id number]  DATE OF BIRTH:  November 06, 1975  DATE OF ADMISSION:  08/16/2017   ADMITTING PHYSICIAN: Amelia Jo, MD  DATE OF DISCHARGE: No discharge date for patient encounter.  PRIMARY CARE PHYSICIAN: Leonel Ramsay, MD   ADMISSION DIAGNOSIS:  Hyperkalemia [E87.5] Weakness [R53.1] Azotemia [R79.89] DISCHARGE DIAGNOSIS:  Active Problems:   Hyperkalemia  SECONDARY DIAGNOSIS:   Past Medical History:  Diagnosis Date  . Depression   . Dialysis patient ([place])   . ESRD (end stage renal disease) on dialysis ([place])   . Failure to thrive in adult   . Gout   . Guillain Barr syndrome ([place])   . HTN (hypertension)   . Kidney failure   . Pneumonia   . Renal insufficiency   . Respiratory failure [facility])    HOSPITAL COURSE:  A/P: 40M w/ ESRD/missed HD, hyperkalemia, uremia/azotemia.  1.) ESRD/Missed HD in the severe hyperkalemia + uremia/azotemia: Pt w/ 1x missed HD session (Tues 04/23), last HD session Sat 04/20. HD missed 2/2 access problem (discussed below). K+ > 7.5. BUN 120, Cr 21.2. EKG (+) wide QRS, IVCD.  He was treated with calcium, insulin + dextrose, bicarbonate, albuterol, kayexalate.  Potassium decreased to 4.3 after emergent hemodialysis.  He also got hemodialysis this morning.  Potassium is stable at 4.8 today.  2.) Failed HD access: Pt w/ RUE AV fistula w/ ulceration. Pt s/p R chest HD catheter.  The patient got dialysis catheter [facility] 2 days ago.  3.) Hyperglycemia: Glucose 203. HbA1c 5.5.  4.) Troponin elevation: Trop-I 0.05. Elevation likely 2/2 impaired renal clearance. (-) CP/SOB. Tele, monitor.  5.) FEN/GI: Renal diet. The patient has no complaints.  Physical examination is unremarkable. DISCHARGE CONDITIONS:  Stable, discharge to home today. CONSULTS OBTAINED:  Treatment Team:  Arta Silence, MD DRUG ALLERGIES:   Allergies  Allergen Reactions  . Ivp  Dye [Iodinated Diagnostic Agents] Other (See Comments)    Reaction unknown  . Ondansetron Other (See Comments)    Stomach pain   . Minoxidil Other (See Comments)    "put fluid around my heart", PERICARDIAL EFFUSION  . Morphine Other (See Comments)    Makes patient aggressive  . Morphine And Related Other (See Comments)    Reaction unknown  . Omnipaque [Iohexol] Itching and Other (See Comments)    Rigors on one occasion, widespread itching on a separate occasion (resolved with Benadryl), tremors   DISCHARGE MEDICATIONS:   Allergies as of 08/17/2017      Reactions   Ivp Dye [iodinated Diagnostic Agents] Other (See Comments)   Reaction unknown   Ondansetron Other (See Comments)   Stomach pain    Minoxidil Other (See Comments)   "put fluid around my heart", PERICARDIAL EFFUSION   Morphine Other (See Comments)   Makes patient aggressive   Morphine And Related Other (See Comments)   Reaction unknown   Omnipaque [iohexol] Itching, Other (See Comments)   Rigors on one occasion, widespread itching on a separate occasion (resolved with Benadryl), tremors      Medication List    STOP taking these medications   vancomycin 500 mg in sodium chloride 0.9 % 100 mL     TAKE these medications   calcium acetate 667 MG capsule Commonly known as:  PHOSLO Take 3 capsules (2,001 mg total) by mouth 3 (three) times daily with meals.   gabapentin 300 MG capsule Commonly known as:  NEURONTIN  Take 600 mg by mouth at bedtime.   midodrine 5 MG tablet Commonly known as:  PROAMATINE Take 1 tablet (5 mg total) by mouth 3 (three) times daily with meals.   oxyCODONE-acetaminophen 10-325 MG tablet Commonly known as:  PERCOCET Take 1 tablet by mouth every 4 (four) hours as needed for pain.   sildenafil 20 MG tablet Commonly known as:  REVATIO Take 20-100 mg by mouth as needed (for ED).        DISCHARGE INSTRUCTIONS:  See AVS  If you experience worsening of your admission symptoms, develop  shortness of breath, life threatening emergency, suicidal or homicidal thoughts you must seek medical attention immediately by calling 911 or calling your MD immediately  if symptoms less severe.  You Must read complete instructions/literature along with all the possible adverse reactions/side effects for all the Medicines you take and that have been prescribed to you. Take any new Medicines after you have completely understood and accpet all the possible adverse reactions/side effects.   Please note  You were cared for by a hospitalist during your hospital stay. If you have any questions about your discharge medications or the care you received while you were in the hospital after you are discharged, you can call the unit and asked to speak with the hospitalist on call if the hospitalist that took care of you is not available. Once you are discharged, your primary care physician will handle any further medical issues. Please note that NO REFILLS for any discharge medications will be authorized once you are discharged, as it is imperative that you return to your primary care physician (or establish a relationship with a primary care physician if you do not have one) for your aftercare needs so that they can reassess your need for medications and monitor your lab values.    On the day of Discharge:  VITAL SIGNS:  Blood pressure 106/70, pulse 69, temperature 97.7 F (36.5 C), temperature source Oral, resp. rate 19, height 6\' 2"  (1.88 m), weight 257 lb 8 oz (116.8 kg), SpO2 98 %. PHYSICAL EXAMINATION:  GENERAL:  42 y.o.-year-old patient lying in the bed with no acute distress.  Obesity. EYES: Pupils equal, round, reactive to light and accommodation. No scleral icterus. Extraocular muscles intact.  HEENT: Head atraumatic, normocephalic. Oropharynx and nasopharynx clear.  NECK:  Supple, no jugular venous distention. No thyroid enlargement, no tenderness.  LUNGS: Normal breath sounds bilaterally, no  wheezing, rales,rhonchi or crepitation. No use of accessory muscles of respiration.  CARDIOVASCULAR: S1, S2 normal. No murmurs, rubs, or gallops.  ABDOMEN: Soft, non-tender, non-distended. Bowel sounds present. No organomegaly or mass.  EXTREMITIES: No pedal edema, cyanosis, or clubbing.  NEUROLOGIC: Cranial nerves II through XII are intact. Muscle strength 5/5 in all extremities. Sensation intact. Gait not checked.  PSYCHIATRIC: The patient is alert and oriented x 3.  SKIN: No obvious rash, lesion, or ulcer.  DATA REVIEW:   CBC Recent Labs  Lab 08/17/17 0736  WBC 6.0  HGB 11.3*  HCT 34.2*  PLT 134*    Chemistries  Recent Labs  Lab 08/16/17 0105  08/17/17 0713  NA 136   < > 139  K >7.5*   < > 4.8  CL 98*   < > 96*  CO2 16*   < > 25  GLUCOSE 203*   < > 136*  BUN 120*   < > 87*  CREATININE 21.20*   < > 15.12*  CALCIUM 7.0*   < > 6.6*  AST 35  --   --   ALT 21  --   --   ALKPHOS 71  --   --   BILITOT 0.5  --   --    < > = values in this interval not displayed.     Microbiology Results  Results for orders placed or performed during the hospital encounter of 08/16/17  MRSA PCR Screening     Status: None   Collection Time: 08/16/17 12:51 AM  Result Value Ref Range Status   MRSA by PCR NEGATIVE NEGATIVE Final    Comment:        The GeneXpert MRSA Assay (FDA approved for NASAL specimens only), is one component of a comprehensive MRSA colonization surveillance program. It is not intended to diagnose MRSA infection nor to guide or monitor treatment for MRSA infections. Performed at [facility], [address], [place], [place] [address]     RADIOLOGY:  No results found.   Management plans discussed with the patient, family and they are in agreement.  CODE STATUS: Full Code   TOTAL TIME TAKING CARE OF THIS PATIENT: 33 minutes.    Demetrios Loll M.D on 08/17/2017 at 3:32 PM  Between 7am to 6pm - Pager - [phone]  After 6pm go to [email or web address] -  Proofreader  [facility]  Office  [phone]  CC: Primary care physician; Leonel Ramsay, MD   Note: This dictation was prepared with Dragon dictation along with smaller phrase technology. Any transcriptional errors that result from this process are unintentional.

## 2017-08-17 NOTE — Progress Notes (Signed)
HD Tx started /o complication. Pt denies complaint/stable for tx. Pt has chronic low b/p, wants to challenge fluid removal, MD aware.    08/17/17 0949  Vital Signs  Pulse Rate 75  Resp 19  BP 104/80  Oxygen Therapy  SpO2 97 %  During Hemodialysis Assessment  Blood Flow Rate (mL/min) 400 mL/min  Arterial Pressure (mmHg) -160 mmHg  Venous Pressure (mmHg) 150 mmHg  Transmembrane Pressure (mmHg) 80 mmHg  Ultrafiltration Rate (mL/min) 1500 mL/min  Dialysate Flow Rate (mL/min) 600 ml/min  Conductivity: Machine  14.3  HD Safety Checks Performed Yes  Dialysis Fluid Bolus Normal Saline  Bolus Amount (mL) 250 mL  Intra-Hemodialysis Comments Tx initiated (pt stable )

## 2017-08-17 NOTE — Plan of Care (Signed)

## 2017-11-06 ENCOUNTER — Ambulatory Visit: Payer: Medicare Other | Attending: Anesthesiology | Admitting: Anesthesiology

## 2017-11-06 ENCOUNTER — Other Ambulatory Visit: Payer: Self-pay

## 2017-11-06 ENCOUNTER — Encounter: Payer: Self-pay | Admitting: Anesthesiology

## 2017-11-06 VITALS — BP 87/62 | HR 99 | Temp 98.0°F | Resp 16 | Ht 74.0 in | Wt 250.0 lb

## 2017-11-06 DIAGNOSIS — Z959 Presence of cardiac and vascular implant and graft, unspecified: Secondary | ICD-10-CM | POA: Diagnosis not present

## 2017-11-06 DIAGNOSIS — M109 Gout, unspecified: Secondary | ICD-10-CM | POA: Insufficient documentation

## 2017-11-06 DIAGNOSIS — Z833 Family history of diabetes mellitus: Secondary | ICD-10-CM | POA: Insufficient documentation

## 2017-11-06 DIAGNOSIS — I120 Hypertensive chronic kidney disease with stage 5 chronic kidney disease or end stage renal disease: Secondary | ICD-10-CM | POA: Diagnosis not present

## 2017-11-06 DIAGNOSIS — R627 Adult failure to thrive: Secondary | ICD-10-CM | POA: Diagnosis not present

## 2017-11-06 DIAGNOSIS — Z79891 Long term (current) use of opiate analgesic: Secondary | ICD-10-CM | POA: Diagnosis not present

## 2017-11-06 DIAGNOSIS — G8929 Other chronic pain: Secondary | ICD-10-CM

## 2017-11-06 DIAGNOSIS — R531 Weakness: Secondary | ICD-10-CM | POA: Insufficient documentation

## 2017-11-06 DIAGNOSIS — F329 Major depressive disorder, single episode, unspecified: Secondary | ICD-10-CM | POA: Insufficient documentation

## 2017-11-06 DIAGNOSIS — G894 Chronic pain syndrome: Secondary | ICD-10-CM | POA: Insufficient documentation

## 2017-11-06 DIAGNOSIS — M545 Low back pain: Secondary | ICD-10-CM | POA: Diagnosis not present

## 2017-11-06 DIAGNOSIS — M5136 Other intervertebral disc degeneration, lumbar region: Secondary | ICD-10-CM | POA: Diagnosis not present

## 2017-11-06 DIAGNOSIS — M25561 Pain in right knee: Secondary | ICD-10-CM | POA: Diagnosis not present

## 2017-11-06 DIAGNOSIS — Z8701 Personal history of pneumonia (recurrent): Secondary | ICD-10-CM | POA: Diagnosis not present

## 2017-11-06 DIAGNOSIS — R29898 Other symptoms and signs involving the musculoskeletal system: Secondary | ICD-10-CM | POA: Diagnosis not present

## 2017-11-06 DIAGNOSIS — N186 End stage renal disease: Secondary | ICD-10-CM | POA: Insufficient documentation

## 2017-11-06 DIAGNOSIS — M47816 Spondylosis without myelopathy or radiculopathy, lumbar region: Secondary | ICD-10-CM

## 2017-11-06 DIAGNOSIS — F1190 Opioid use, unspecified, uncomplicated: Secondary | ICD-10-CM | POA: Diagnosis not present

## 2017-11-06 DIAGNOSIS — M79672 Pain in left foot: Secondary | ICD-10-CM | POA: Insufficient documentation

## 2017-11-06 DIAGNOSIS — Z992 Dependence on renal dialysis: Secondary | ICD-10-CM | POA: Diagnosis not present

## 2017-11-06 DIAGNOSIS — Z841 Family history of disorders of kidney and ureter: Secondary | ICD-10-CM | POA: Diagnosis not present

## 2017-11-06 DIAGNOSIS — M79671 Pain in right foot: Secondary | ICD-10-CM | POA: Diagnosis not present

## 2017-11-06 DIAGNOSIS — G610 Guillain-Barre syndrome: Secondary | ICD-10-CM | POA: Diagnosis not present

## 2017-11-06 DIAGNOSIS — Z79899 Other long term (current) drug therapy: Secondary | ICD-10-CM | POA: Diagnosis not present

## 2017-11-06 DIAGNOSIS — J9690 Respiratory failure, unspecified, unspecified whether with hypoxia or hypercapnia: Secondary | ICD-10-CM | POA: Insufficient documentation

## 2017-11-06 NOTE — Progress Notes (Signed)
Safety precautions to be maintained throughout the outpatient stay will include: orient to surroundings, keep bed in low position, maintain call bell within reach at all times, provide assistance with transfer out of bed and ambulation.  

## 2017-11-07 NOTE — Progress Notes (Addendum)
Subjective:  Patient ID: Patrick Downs, male    DOB: 11-Aug-1975  Age: 42 y.o. MRN: [id number]  CC: Back Pain (low)     PROCEDURE: None  HPI Patrick Downs presents for a new patient evaluation.  Is a pleasant 42 year old black male with a long-standing history of low back pain.  He also has some knee pain and bilateral foot pain.  He also has a history of end-stage renal disease.  He is referred by Dr. Ola Spurr who is his internal medicine doctor.  Secondary to his chronic pain he has been on opioids for long-standing history and previously seen at a pain clinic in [place].  He reports 3 years of gradual to sudden onset low back pain beginning in April 2016.  The pain is gradually gotten worse over time with a maximum pain score of 10 minimum of an 8 and averaging a 7 or 8.  He states that the pain is not influenced by time of day but is aggravated by bending kneeling sitting standing squatting and walking up hills.  It is alleviated by rest sleep and warm showers.  Medication management has helped.  Unfortunately he has been unable to take anti-inflammatories secondary to end-stage renal disease.  He has been on opioids for pain control and refuses any interventional therapy.  He reports that sometime ago he had to have a lumbar puncture for the diagnosis of Guyon Aris Lot and he has had pain in the low back at that site since then.  This is the primary reason for not allowing for any interventional low back therapy or injection therapy.  The pain he has is associated with some tingling in the lower legs some right knee pain pain that wakes him up at night described as an aching agonizing constant deep dreadful horrible disabling pain.  He has had a previous MRI scan which is shown some evidence of L5-S1 degenerative disc issues and he furthermore states that he has been through physical therapy with no significant improvement.  History Patrick Downs has a past medical history of Depression, Dialysis  patient ([place]), ESRD (end stage renal disease) on dialysis ([place]), Failure to thrive in adult, Gout, Guillain Barr syndrome ([place]), Guillain-Barre ([place]), HTN (hypertension), Kidney failure, Pneumonia, Renal insufficiency, and Respiratory failure ([place]).   He has a past surgical history that includes AV fistula placement; Parathyroidectomy; tonsiilectomy; tracheotomy; Renal biopsy; and Amputation toe (Right, 06/08/2017).   His family history includes Diabetes Mellitus II in his father; Kidney disease in his father; Kidney failure in his paternal grandfather.He reports that he has never smoked. He has never used smokeless tobacco. He reports that he has current or past drug history. Drug: Marijuana. He reports that he does not drink alcohol.  No procedure found.  No results found for: TOXASSSELUR  Outpatient Medications Prior to Visit  Medication Sig Dispense Refill  . calcium acetate (PHOSLO) 667 MG capsule Take 3 capsules (2,001 mg total) by mouth 3 (three) times daily with meals. 1 capsule 0  . gabapentin (NEURONTIN) 300 MG capsule Take 600 mg by mouth at bedtime.     . midodrine (PROAMATINE) 5 MG tablet Take 1 tablet (5 mg total) by mouth 3 (three) times daily with meals. 90 tablet 2  . sildenafil (REVATIO) 20 MG tablet Take 20-100 mg by mouth as needed (for ED).     [place] Kitchen gabapentin (NEURONTIN) 300 MG capsule Take by mouth.    . oxyCODONE-acetaminophen (PERCOCET) 10-325 MG tablet Take 1 tablet by mouth every  4 (four) hours as needed for pain.   0   No facility-administered medications prior to visit.    Lab Results  Component Value Date   WBC 6.0 08/17/2017   HGB 11.3 (L) 08/17/2017   HCT 34.2 (L) 08/17/2017   PLT 134 (L) 08/17/2017   GLUCOSE 136 (H) 08/17/2017   TRIG 673 (H) 08/21/2014   ALT 21 08/16/2017   AST 35 08/16/2017   NA 139 08/17/2017   K 4.8 08/17/2017   CL 96 (L) 08/17/2017   CREATININE 15.12 (H) 08/17/2017   BUN 87 (H) 08/17/2017   CO2 25 08/17/2017   TSH 5.560 (H)  08/27/2014   INR 1.46 08/31/2014   HGBA1C 5.5 08/16/2017    --------------------------------------------------------------------------------------------------------------------- Dg Chest Port 1 View  Result Date: 08/16/2017 CLINICAL DATA:  Worsening back pain today. New vascular access device placed yesterday. EXAM: PORTABLE CHEST 1 VIEW COMPARISON:  04/21/2017 FINDINGS: The heart size and mediastinal contours are within normal limits. Right IJ dialysis catheter tip is seen in the proximal right atrium. Both lungs are clear. Vascular stent overlies the left subclavian region on this study. The visualized skeletal structures are unremarkable. IMPRESSION: No active disease. Electronically Signed   By: Ashley Royalty M.D.   On: 08/16/2017 01:27       ---------------------------------------------------------------------------------------------------------------------- Past Medical History:  Diagnosis Date  . Depression   . Dialysis patient ([place])   . ESRD (end stage renal disease) on dialysis ([place])   . Failure to thrive in adult   . Gout   . Guillain Barr syndrome ([place])   . Guillain-Barre ([place])   . HTN (hypertension)   . Kidney failure   . Pneumonia   . Renal insufficiency   . Respiratory failure [facility])     Past Surgical History:  Procedure Laterality Date  . AMPUTATION TOE Right 06/08/2017   Procedure: AMPUTATION TOE RIGHT FIFTH TOE;  Surgeon: Samara Deist, DPM;  Location: [facility] ORS;  Service: Podiatry;  Laterality: Right;  . AV FISTULA PLACEMENT     x5      2 graphs  . PARATHYROIDECTOMY    . RENAL BIOPSY    . tonsiilectomy    . tracheotomy      Family History  Problem Relation Age of Onset  . Diabetes Mellitus II Father   . Kidney disease Father   . Kidney failure Paternal Grandfather   . Prostate cancer Neg Hx   . Kidney cancer Neg Hx   . Bladder Cancer Neg Hx     Social History   Tobacco Use  . Smoking status: Never Smoker  . Smokeless tobacco: Never Used   Substance Use Topics  . Alcohol use: No    ---------------------------------------------------------------------------------------------------------------------  Scheduled Meds: Continuous Infusions: PRN Meds:.   BP (!) 87/62   Pulse 99   Temp 98 F (36.7 C)   Resp 16   Ht 6\' 2"  (1.88 m)   Wt 250 lb (113.4 kg)   SpO2 98%   BMI 32.10 kg/m    BP Readings from Last 3 Encounters:  11/06/17 (!) 87/62  08/17/17 106/70  06/08/17 108/62     Wt Readings from Last 3 Encounters:  11/06/17 250 lb (113.4 kg)  08/17/17 257 lb 8 oz (116.8 kg)  06/08/17 250 lb (113.4 kg)     ----------------------------------------------------------------------------------------------------------------------  ROS Review of Systems Cardiovascular negative for chest pain or shortness of breath Pulmonary: Negative for wheezing smoking Neurologic: Positive for Guyon Barr and lower extremity weakness with bilateral foot drop  requiring brace support of both feet. Psychological: Depression and history of attempted suicide GU history of end-stage renal disease  Objective:  BP (!) 87/62   Pulse 99   Temp 98 F (36.7 C)   Resp 16   Ht 6\' 2"  (1.88 m)   Wt 250 lb (113.4 kg)   SpO2 98%   BMI 32.10 kg/m   Physical Exam Patient is alert oriented cooperative compliant. Heart is regular rate and rhythm without murmur or lifts or gallops or rubs Lungs are clear to auscultation bilaterally without wheezes or rales Inspection of low back reveals some paraspinous muscle tenderness with bilateral trigger points approximately L4 and L5 level.  With the patient standing he has pain on extension with left and right lateral rotation.  This does provoke his primary pain complaint.  In the supine position he has a negative straight leg raise bilaterally.  He also wears a brace at the base of both feet secondary to weakness with dorsiflexion of both feet.  He has diminished sensation throughout the lower  extremities.     Assessment & Plan:   Patrick Downs was seen today for back pain.  Diagnoses and all orders for this visit:  DDD (degenerative disc disease), lumbar -     Ambulatory referral to Physical Therapy  Chronic bilateral low back pain without sciatica  Facet arthritis of lumbar region -     Ambulatory referral to Physical Therapy  Weakness of both lower extremities  Guillain-Barre syndrome (HCC)  Chronic, continuous use of opioids -     Drug screen panel 1, serum  Chronic pain syndrome -     Drug screen panel 1, serum     ----------------------------------------------------------------------------------------------------------------------  Problem List Items Addressed This Visit    None    Visit Diagnoses    DDD (degenerative disc disease), lumbar    -  Primary   Relevant Orders   Ambulatory referral to Physical Therapy   Chronic bilateral low back pain without sciatica       Facet arthritis of lumbar region       Relevant Orders   Ambulatory referral to Physical Therapy   Weakness of both lower extremities       Guillain-Barre syndrome (HCC)       Relevant Medications   gabapentin (NEURONTIN) 300 MG capsule   Chronic, continuous use of opioids       Relevant Orders   Drug screen panel 1, serum   Chronic pain syndrome       Relevant Orders   Drug screen panel 1, serum      ----------------------------------------------------------------------------------------------------------------------  1. DDD (degenerative disc disease), lumbar He is refusing any type of interventional therapy.  Secondary to his chronic weakness this is also reasonable.  We will defer on any interventional therapy as discussed with him.  We feel that he would benefit from physical therapy and I have made this referral to assist with range of motion and stretching strengthening exercises. - Ambulatory referral to Physical Therapy  2. Chronic bilateral low back pain without  sciatica As above  3. Facet arthritis of lumbar region As above - Ambulatory referral to Physical Therapy  4. Weakness of both lower extremities Can you follow-up with his primary care physician and neurologist.  5. Guillain-Barre syndrome ([place]) As above  6. Chronic, continuous use of opioids In the past he has been on opioid therapy.  It is reported that his girlfriend reported that he was selling his medications at his primary pain  clinic in the past.  They subsequently discontinued his medications though the patient denies this and states that he never had problems with any drug testing or pill counts.  He feels that there was no way to refute the claim by his girlfriend and that this was done out of spite and following a recent break-up and argument that prompted this.  This is his report.  I have told him that we will have to review the information we can find regarding his previous care and also initiate preliminary drug testing before we could consider any opioid therapy.  Furthermore we will need to review his other diagnostic studies to determine if he is a candidate for chronic opioid therapy as well. - Drug screen panel 1, serum  7. Chronic pain syndrome As above - Drug screen panel 1, serum    ----------------------------------------------------------------------------------------------------------------------  I am having Patrick Downs maintain his calcium acetate, gabapentin, midodrine, sildenafil, oxyCODONE-acetaminophen, and gabapentin.   No orders of the defined types were placed in this encounter.      Follow-up: Return in about 1 month (around 12/07/2017) for evaluation.    Molli Barrows, MD 1:42 PM  The [place] practitioner database for opioid medications on this patient has been reviewed by me and my staff   Greater than 50% of the total encounter time was spent in counseling and / or coordination of care.     This dictation was performed utilizing Actor.  Please excuse any unintentional or mistaken typographical errors as a result.

## 2017-11-16 LAB — OPIATES,MS,WB/SP RFX
6-Acetylmorphine: NEGATIVE
Codeine: NEGATIVE ng/mL
DIHYDROCODEINE: NEGATIVE ng/mL
Hydrocodone: 1.5 ng/mL
Hydromorphone: NEGATIVE ng/mL
Morphine: NEGATIVE ng/mL
Opiate Confirmation: POSITIVE

## 2017-11-16 LAB — DRUG SCREEN 10 W/CONF, SERUM
Amphetamines, IA: NEGATIVE ng/mL
Barbiturates, IA: NEGATIVE ug/mL
Benzodiazepines, IA: NEGATIVE ng/mL
COCAINE & METABOLITE, IA: NEGATIVE ng/mL
Methadone, IA: NEGATIVE ng/mL
OPIATES, IA: POSITIVE ng/mL — AB
OXYCODONES, IA: NEGATIVE ng/mL
PHENCYCLIDINE, IA: NEGATIVE ng/mL
Propoxyphene, IA: NEGATIVE ng/mL
THC(MARIJUANA) METABOLITE, IA: POSITIVE ng/mL — AB

## 2017-11-16 LAB — THC,MS,WB/SP RFX
CANNABIDIOL: NEGATIVE ng/mL
CARBOXY-THC: 8 ng/mL
Cannabinoid Confirmation: POSITIVE
Cannabinol: NEGATIVE ng/mL
Hydroxy-THC: NEGATIVE ng/mL
Tetrahydrocannabinol(THC): 1.5 ng/mL

## 2017-11-16 LAB — OXYCODONES,MS,WB/SP RFX
OXYCOCONE: NEGATIVE ng/mL
OXYCODONES CONFIRMATION: NEGATIVE
OXYMORPHONE: NEGATIVE ng/mL

## 2017-11-27 ENCOUNTER — Ambulatory Visit: Payer: Medicare Other | Attending: Anesthesiology | Admitting: Anesthesiology

## 2017-11-27 ENCOUNTER — Encounter: Payer: Self-pay | Admitting: Anesthesiology

## 2017-11-27 VITALS — BP 115/78 | Temp 98.0°F | Ht 74.0 in | Wt 250.0 lb

## 2017-11-27 DIAGNOSIS — Z992 Dependence on renal dialysis: Secondary | ICD-10-CM | POA: Insufficient documentation

## 2017-11-27 DIAGNOSIS — R29898 Other symptoms and signs involving the musculoskeletal system: Secondary | ICD-10-CM

## 2017-11-27 DIAGNOSIS — G8929 Other chronic pain: Secondary | ICD-10-CM

## 2017-11-27 DIAGNOSIS — F1190 Opioid use, unspecified, uncomplicated: Secondary | ICD-10-CM

## 2017-11-27 DIAGNOSIS — Z79891 Long term (current) use of opiate analgesic: Secondary | ICD-10-CM | POA: Insufficient documentation

## 2017-11-27 DIAGNOSIS — M545 Low back pain: Secondary | ICD-10-CM

## 2017-11-27 DIAGNOSIS — G894 Chronic pain syndrome: Secondary | ICD-10-CM | POA: Diagnosis present

## 2017-11-27 DIAGNOSIS — M47816 Spondylosis without myelopathy or radiculopathy, lumbar region: Secondary | ICD-10-CM | POA: Diagnosis not present

## 2017-11-27 DIAGNOSIS — N186 End stage renal disease: Secondary | ICD-10-CM | POA: Insufficient documentation

## 2017-11-27 DIAGNOSIS — M4686 Other specified inflammatory spondylopathies, lumbar region: Secondary | ICD-10-CM | POA: Insufficient documentation

## 2017-11-27 DIAGNOSIS — M5136 Other intervertebral disc degeneration, lumbar region: Secondary | ICD-10-CM | POA: Diagnosis not present

## 2017-11-27 DIAGNOSIS — Z79899 Other long term (current) drug therapy: Secondary | ICD-10-CM | POA: Insufficient documentation

## 2017-11-27 DIAGNOSIS — R531 Weakness: Secondary | ICD-10-CM | POA: Diagnosis not present

## 2017-11-27 NOTE — Progress Notes (Signed)
Subjective:  Patient ID: Patrick Downs, male    DOB: 04/25/1975  Age: 42 y.o. MRN: [id number]  CC: Back Pain (lower)   Procedure: None  HPI Patrick Downs presents for reevaluation.  He was last seen about a month ago and continues to have severe incapacitating low back pain.  He also has chronic lower extremity weakness secondary to Guillain-Barr with persistent weakness and limited dorsiflexion of the bilateral feet.  He has been unwilling to undergo any type of interventional therapy as he has had persistent pain following a lumbar puncture as well.  Unfortunately he did test negative for serum drug screen for marijuana and opioids.  He admits that he did use some of his girlfriends Vicodin secondary to the chronic pain.  Otherwise no change in his lower extremity strength or function is noted the pain has been stable in nature quality and consistency.  He still has spasming primarily in the low back.  Outpatient Medications Prior to Visit  Medication Sig Dispense Refill  . calcium acetate (PHOSLO) 667 MG capsule Take 3 capsules (2,001 mg total) by mouth 3 (three) times daily with meals. 1 capsule 0  . gabapentin (NEURONTIN) 300 MG capsule Take 600 mg by mouth at bedtime.     . gabapentin (NEURONTIN) 300 MG capsule Take by mouth.    . midodrine (PROAMATINE) 5 MG tablet Take 1 tablet (5 mg total) by mouth 3 (three) times daily with meals. 90 tablet 2  . sildenafil (REVATIO) 20 MG tablet Take 20-100 mg by mouth as needed (for ED).     Marland Kitchen oxyCODONE-acetaminophen (PERCOCET) 10-325 MG tablet Take 1 tablet by mouth every 4 (four) hours as needed for pain.   0   No facility-administered medications prior to visit.     Review of Systems CNS: No confusion or sedation Cardiac: No angina or palpitations GI: No abdominal pain or constipation Constitutional: No nausea vomiting fevers or chills  Objective:  BP 115/78 Comment: lower left extrem.  Temp 98 F (36.7 C)   Ht 6\' 2"  (1.88 m)    Wt 250 lb (113.4 kg)   BMI 32.10 kg/m    BP Readings from Last 3 Encounters:  11/27/17 115/78  11/06/17 (!) 87/62  08/17/17 106/70     Wt Readings from Last 3 Encounters:  11/27/17 250 lb (113.4 kg)  11/06/17 250 lb (113.4 kg)  08/17/17 257 lb 8 oz (116.8 kg)     Physical Exam Pt is alert and oriented PERRL EOMI MUSCULOSKELETAL he is still using splints for bilateral foot support with difficulty ambulation.  Labs  Lab Results  Component Value Date   HGBA1C 5.5 08/16/2017   Lab Results  Component Value Date   CREATININE 15.12 (H) 08/17/2017    -------------------------------------------------------------------------------------------------------------------- Lab Results  Component Value Date   WBC 6.0 08/17/2017   HGB 11.3 (L) 08/17/2017   HCT 34.2 (L) 08/17/2017   PLT 134 (L) 08/17/2017   GLUCOSE 136 (H) 08/17/2017   TRIG 673 (H) 08/21/2014   ALT 21 08/16/2017   AST 35 08/16/2017   NA 139 08/17/2017   K 4.8 08/17/2017   CL 96 (L) 08/17/2017   CREATININE 15.12 (H) 08/17/2017   BUN 87 (H) 08/17/2017   CO2 25 08/17/2017   TSH 5.560 (H) 08/27/2014   INR 1.46 08/31/2014   HGBA1C 5.5 08/16/2017    --------------------------------------------------------------------------------------------------------------------- Dg Chest Port 1 View  Result Date: 08/16/2017 CLINICAL DATA:  Worsening back pain today. New vascular access device placed  yesterday. EXAM: PORTABLE CHEST 1 VIEW COMPARISON:  04/21/2017 FINDINGS: The heart size and mediastinal contours are within normal limits. Right IJ dialysis catheter tip is seen in the proximal right atrium. Both lungs are clear. Vascular stent overlies the left subclavian region on this study. The visualized skeletal structures are unremarkable. IMPRESSION: No active disease. Electronically Signed   By: Ashley Royalty M.D.   On: 08/16/2017 01:27     Assessment & Plan:   Patrick Downs was seen today for back pain.  Diagnoses and all  orders for this visit:  DDD (degenerative disc disease), lumbar  Chronic bilateral low back pain without sciatica  Facet arthritis of lumbar region  Weakness of both lower extremities  Chronic, continuous use of opioids -     Drug Screen 10 W/Conf, Serum  Chronic pain syndrome -     Drug Screen 10 W/Conf, Serum  ESRD on dialysis ([place])        ----------------------------------------------------------------------------------------------------------------------  Problem List Items Addressed This Visit      Unprioritized   ESRD on dialysis [facility])    Other Visit Diagnoses    DDD (degenerative disc disease), lumbar    -  Primary   Chronic bilateral low back pain without sciatica       Facet arthritis of lumbar region       Weakness of both lower extremities       Chronic, continuous use of opioids       Relevant Orders   Drug Screen 10 W/Conf, Serum   Chronic pain syndrome       Relevant Orders   Drug Screen 10 W/Conf, Serum        ----------------------------------------------------------------------------------------------------------------------  1. DDD (degenerative disc disease), lumbar I had a long discussion with Patrick Downs regarding his care.  Unfortunately he has used an illicit drug for medication management.  This is unacceptable based on clinic policy.  I discussed this with him in detail today and we will schedule him for a repeat evaluation in 1 month and a repeat serum drug screen in 3 weeks to allow the [facility] to clear his system.  If he remains positive we will not initiate opioid therapy.  We will check monthly on his urine drug screen and expect that this will remain negative and that he comply with clinic policy regarding opioid management.  Unfortunately he does have a difficult situation and that he does have chronic pain with his previous neurologic condition and low back derangement.  However he will need to be compliant with her narcotic contract as  well.  2. Chronic bilateral low back pain without sciatica As above  3. Facet arthritis of lumbar region As above  4. Weakness of both lower extremities To new follow-up with his primary care physicians and neurologist.  5. Chronic, continuous use of opioids As above - Drug Screen 10 W/Conf, Serum  6. Chronic pain syndrome  - Drug Screen 10 W/Conf, Serum  7. ESRD on dialysis [facility]) Continue follow-up with his dialysis management team.    ----------------------------------------------------------------------------------------------------------------------  I am having Patrick Downs maintain his calcium acetate, gabapentin, midodrine, sildenafil, oxyCODONE-acetaminophen, and gabapentin.   No orders of the defined types were placed in this encounter.  Patient's Medications  New Prescriptions   No medications on file  Previous Medications   CALCIUM ACETATE (PHOSLO) 667 MG CAPSULE    Take 3 capsules (2,001 mg total) by mouth 3 (three) times daily with meals.   GABAPENTIN (NEURONTIN) 300 MG CAPSULE  Take 600 mg by mouth at bedtime.    GABAPENTIN (NEURONTIN) 300 MG CAPSULE    Take by mouth.   MIDODRINE (PROAMATINE) 5 MG TABLET    Take 1 tablet (5 mg total) by mouth 3 (three) times daily with meals.   OXYCODONE-ACETAMINOPHEN (PERCOCET) 10-325 MG TABLET    Take 1 tablet by mouth every 4 (four) hours as needed for pain.    SILDENAFIL (REVATIO) 20 MG TABLET    Take 20-100 mg by mouth as needed (for ED).   Modified Medications   No medications on file  Discontinued Medications   No medications on file   ----------------------------------------------------------------------------------------------------------------------  Follow-up: Return in about 6 weeks (around 01/08/2018) for evaluation, med refill.    Molli Barrows, MD

## 2017-11-27 NOTE — Progress Notes (Signed)
Safety precautions to be maintained throughout the outpatient stay will include: orient to surroundings, keep bed in low position, maintain call bell within reach at all times, provide assistance with transfer out of bed and ambulation.  

## 2017-11-28 ENCOUNTER — Ambulatory Visit: Payer: Medicare Other | Attending: Anesthesiology

## 2017-12-03 ENCOUNTER — Ambulatory Visit: Payer: Medicare Other

## 2017-12-05 ENCOUNTER — Ambulatory Visit: Payer: Medicare Other

## 2017-12-10 ENCOUNTER — Ambulatory Visit: Payer: Medicare Other

## 2017-12-28 ENCOUNTER — Encounter (INDEPENDENT_AMBULATORY_CARE_PROVIDER_SITE_OTHER): Payer: Self-pay | Admitting: Vascular Surgery

## 2017-12-28 ENCOUNTER — Encounter (INDEPENDENT_AMBULATORY_CARE_PROVIDER_SITE_OTHER): Payer: Self-pay

## 2017-12-28 ENCOUNTER — Ambulatory Visit (INDEPENDENT_AMBULATORY_CARE_PROVIDER_SITE_OTHER): Payer: Medicare Other | Admitting: Vascular Surgery

## 2017-12-28 VITALS — BP 90/54 | HR 90 | Resp 16 | Ht 74.0 in | Wt 265.6 lb

## 2017-12-28 DIAGNOSIS — Z992 Dependence on renal dialysis: Secondary | ICD-10-CM

## 2017-12-28 DIAGNOSIS — N186 End stage renal disease: Secondary | ICD-10-CM | POA: Diagnosis not present

## 2017-12-28 DIAGNOSIS — G610 Guillain-Barre syndrome: Secondary | ICD-10-CM

## 2017-12-28 DIAGNOSIS — I10 Essential (primary) hypertension: Secondary | ICD-10-CM | POA: Diagnosis not present

## 2017-12-28 NOTE — Assessment & Plan Note (Addendum)
blood pressure control important in reducing the progression of atherosclerotic disease.  Now actually having some more issues with low blood pressure on dialysis.

## 2017-12-28 NOTE — Progress Notes (Signed)
Patient ID: ERAGON HAMMOND, male   DOB: 02/23/1976, 42 y.o.   MRN: [id number]  Chief Complaint  Patient presents with  . New Patient (Initial Visit)    ref for discuss pd access    HPI FROILAN MCLEAN is a 42 y.o. male.  I am asked to see the patient by DR. Voora for evaluation of dialysis access options.  The patient reports doing been on dialysis for about 20 years.  He has had a failed fistula in the lower arm in the upper arm on the left.  He had a failed graft in the upper arm on the right.  He had a right brachiobasilic AV fistula which was never transposed.  He has a right radiocephalic AV fistula which is still open, but he had skin breakdown and apparently the outflow was through multiple branches at the antecubital fossa so this has stopped being used.  He is not interested in thigh grafts or PD catheters.  He has been maintained on a PermCath in the right jugular vein for almost a year.  No fevers or chills.  He gets dialysis on Tuesdays, Thursdays, and Saturdays.  He is here to discuss more durable dialysis access options.  He is in his usual state of health without new complaints today   Past Medical History:  Diagnosis Date  . Depression   . Dialysis patient ([place])   . ESRD (end stage renal disease) on dialysis ([place])   . Failure to thrive in adult   . Gout   . Guillain Barr syndrome ([place])   . Guillain-Barre ([place])   . HTN (hypertension)   . Kidney failure   . Pneumonia   . Renal insufficiency   . Respiratory failure [facility])     Past Surgical History:  Procedure Laterality Date  . AMPUTATION TOE Right 06/08/2017   Procedure: AMPUTATION TOE RIGHT FIFTH TOE;  Surgeon: Samara Deist, DPM;  Location: [facility] ORS;  Service: Podiatry;  Laterality: Right;  . AV FISTULA PLACEMENT     x5      2 graphs  . PARATHYROIDECTOMY    . RENAL BIOPSY    . tonsiilectomy    . tracheotomy      Family History  Problem Relation Age of Onset  . Diabetes Mellitus II Father   . Kidney  disease Father   . Kidney failure Paternal Grandfather   . Prostate cancer Neg Hx   . Kidney cancer Neg Hx   . Bladder Cancer Neg Hx     Social History Social History   Tobacco Use  . Smoking status: Never Smoker  . Smokeless tobacco: Never Used  Substance Use Topics  . Alcohol use: No  . Drug use: Yes    Types: Marijuana    Allergies  Allergen Reactions  . Ivp Dye [Iodinated Diagnostic Agents] Other (See Comments)    Reaction unknown  . Ondansetron Other (See Comments)    Stomach pain   . Minoxidil Other (See Comments)    "put fluid around my heart", PERICARDIAL EFFUSION  . Morphine Other (See Comments)    Makes patient aggressive  . Morphine And Related Other (See Comments)    Reaction unknown  . Omnipaque [Iohexol] Itching and Other (See Comments)    Rigors on one occasion, widespread itching on a separate occasion (resolved with Benadryl), tremors    Current Outpatient Medications  Medication Sig Dispense Refill  . calcium acetate (PHOSLO) 667 MG capsule Take 3 capsules (2,001 mg total)  by mouth 3 (three) times daily with meals. 1 capsule 0  . gabapentin (NEURONTIN) 300 MG capsule Take 600 mg by mouth at bedtime.     . gabapentin (NEURONTIN) 300 MG capsule Take by mouth.    . midodrine (PROAMATINE) 5 MG tablet Take 1 tablet (5 mg total) by mouth 3 (three) times daily with meals. 90 tablet 2  . oxyCODONE-acetaminophen (PERCOCET) 10-325 MG tablet Take 1 tablet by mouth every 4 (four) hours as needed for pain.   0  . sildenafil (REVATIO) 20 MG tablet Take 20-100 mg by mouth as needed (for ED).      No current facility-administered medications for this visit.       REVIEW OF SYSTEMS (Negative unless checked)  Constitutional: [] Weight loss  [] Fever  [] Chills Cardiac: [] Chest pain   [] Chest pressure   [] Palpitations   [] Shortness of breath when laying flat   [] Shortness of breath at rest   [] Shortness of breath with exertion. Vascular:  [] Pain in legs with walking    [] Pain in legs at rest   [] Pain in legs when laying flat   [] Claudication   [] Pain in feet when walking  [] Pain in feet at rest  [] Pain in feet when laying flat   [] History of DVT   [] Phlebitis   [] Swelling in legs   [] Varicose veins   [] Non-healing ulcers Pulmonary:   [] Uses home oxygen   [] Productive cough   [] Hemoptysis   [] Wheeze  [] COPD   [] Asthma Neurologic:  [] Dizziness  [] Blackouts   [] Seizures   [] History of stroke   [] History of TIA  [] Aphasia   [] Temporary blindness   [] Dysphagia   [] Weakness or numbness in arms   [x] Weakness or numbness in legs Musculoskeletal:  [x] Arthritis   [] Joint swelling   [] Joint pain   [] Low back pain Hematologic:  [] Easy bruising  [] Easy bleeding   [] Hypercoagulable state   [x] Anemic  [] Hepatitis Gastrointestinal:  [] Blood in stool   [] Vomiting blood  [] Gastroesophageal reflux/heartburn   [] Abdominal pain Genitourinary:  [x] Chronic kidney disease   [] Difficult urination  [] Frequent urination  [] Burning with urination   [] Hematuria Skin:  [] Rashes   [] Ulcers   [] Wounds Psychological:  [] History of anxiety   [x]  History of major depression.    Physical Exam BP (!) 90/54 (BP Location: Left Arm)   Pulse 90   Resp 16   Ht 6\' 2"  (1.88 m)   Wt 265 lb 9.6 oz (120.5 kg)   BMI 34.10 kg/m  Gen:  WD/WN, NAD Head: [place]/AT, No temporalis wasting.  Ear/Nose/Throat: Hearing grossly intact, nares w/o erythema or drainage, oropharynx w/o Erythema/Exudate Eyes: Conjunctiva clear, sclera non-icteric  Neck: trachea midline.   Pulmonary:  Good air movement, respirations not labored, no use of accessory muscles  Cardiac: RRR, no JVD Vascular: Right radiocephalic AV fistula somewhat pulsatile without a good thrill.  Left arm access is without thrill or flow present. Vessel Right Left                                       Musculoskeletal: M/S 5/5 throughout. Wears foot drop splints. No edema. Neurologic: Sensation grossly intact in extremities.  Symmetrical.  Speech  is fluent. Motor exam as listed above. Psychiatric: Judgment intact, Mood & affect appropriate for pt's clinical situation. Dermatologic: No rashes or ulcers noted.  No cellulitis or open wounds.   Radiology No results found.  Labs Recent Results (  from the past 2160 hour(s))  Drug screen panel 1, serum     Status: Abnormal   Collection Time: 11/06/17  3:54 PM  Result Value Ref Range   Amphetamines, IA Negative Cutoff:50 ng/mL   Barbiturates, IA Negative Cutoff:0.1 ug/mL   Benzodiazepines, IA Negative Cutoff:20 ng/mL   Cocaine & Metabolite, IA Negative Cutoff:25 ng/mL   Phencyclidine, IA Negative Cutoff:8 ng/mL   THC(Marijuana) Metabolite, IA ++POSITIVE++ (A) Cutoff:5 ng/mL   Opiates, IA ++POSITIVE++ (A) Cutoff:5 ng/mL   Oxycodones, IA Negative Cutoff:5 ng/mL   Methadone, IA Negative Cutoff:25 ng/mL   Propoxyphene, IA Negative Cutoff:50 ng/mL    Comment: This test was developed and its performance characteristics determined by [facility].  It has not been cleared or approved by the Food and Drug Administration.   THC,MS,WB/Sp Rfx     Status: None   Collection Time: 11/06/17  3:54 PM  Result Value Ref Range   Cannabinoid Confirmation Positive    Tetrahydrocannabinol(THC) 1.5 ng/mL   Carboxy-THC 8.0 ng/mL   Hydroxy-THC Negative ng/mL   Cannabinol Negative ng/mL   Cannabidiol Negative ng/mL    Comment: Confirmation threshold: 1.0 ng/mL  Opiates,MS,WB/Sp Rfx     Status: None   Collection Time: 11/06/17  3:54 PM  Result Value Ref Range   Opiate Confirmation Positive    Codeine Negative ng/mL   Morphine Negative ng/mL   6-Acetylmorphine Negative    Hydrocodone 1.5 ng/mL   Hydromorphone Negative ng/mL   Dihydrocodeine Negative ng/mL    Comment: Expected metabolism of opiate class drugs:  Parent Drug       Detected Metabolites  -----------       --------------------  Codeine:          Major:  Morphine                    Minor:  Hydrocodone, Hydromorphone,                             Dihydrocodeine  Morphine:         Minor:  Hydromorphone  Hydrocodone:      Hydromorphone, Dihydrocodeine  Hydromorphone:    None  Dihydrocodeine:   None  Heroin:           6-Acetylmorphine                    Morphine                    Codeine, in small amounts in comparison                     to morphine, is often detected when                     heroin is the source drug. Confirmation threshold: 1.0 ng/mL   Oxycodones,MS,WB/Sp Rfx     Status: None   Collection Time: 11/06/17  3:54 PM  Result Value Ref Range   Oxycodones Confirmation Negative    Oxycocone Negative ng/mL   Oxymorphone Negative ng/mL    Comment: Confirmation threshold: 1.0 ng/mL    Assessment/Plan:  HTN (hypertension) blood pressure control important in reducing the progression of atherosclerotic disease.  Now actually having some more issues with low blood pressure on dialysis.   Guillain Barr syndrome, lower ext weakness Wears foot drop splints.  ESRD on dialysis The patient has a difficult dialysis access situation.  He  has been on dialysis for 20 years.  At this point, it is likely that her best access option will be some sort of revision of his right radiocephalic AV fistula if his venous outflow will accept this.  I think starting with a duplex on the right upper extremity and a fistulogram will help surgical planning.  I would be concerned about a central venous stenosis around his existing catheter, and this may have to be addressed or he may have to have some sort of hero graft on that side.  He says the typical grafts have gone very poorly, so an Artegraft is likely going to be a better option.  Given the complexity of the situation I think the preoperative planning is of paramount importance.  We will do this in the near future at his convenience and then go forward with creating a permanent access.      Leotis Pain 12/28/2017, 2:57 PM   This note was created with Dragon medical transcription  system.  Any errors from dictation are unintentional.

## 2017-12-28 NOTE — Patient Instructions (Signed)
Vascular Access for Hemodialysis A vascular access is a connection between two blood vessels that allows blood to be easily removed from the body and returned to the body during hemodialysis. Hemodialysis is a procedure in which a machine outside of the body filters the blood. There are three types of vascular accesses:  Arteriovenous fistula. This is a connection between an artery and a vein (usually in the arm) that is made by sewing them together. Blood in the artery flows directly into the vein, causing it to get larger over time. This makes it easier for the vein to be used for hemodialysis. An arteriovenous fistula takes 1-6 months to develop after surgery.  Arteriovenous graft. This is a connection between an artery and a vein in the arm that is made with a tube. An arteriovenous graft can be used within 2-3 weeks of surgery.  Venous catheter. This is a thin, flexible tube that is placed in a large vein (usually in the neck, chest, or groin). A venous catheter for hemodialysis contains two tubes that come out of the skin. A venous catheter can be used right away. It is usually used as a temporary access if you need hemodialysis before a fistula or graft has developed. It may also be used as a permanent access if a fistula or graft cannot be created.  Which type of access is best for me? The type of access that is best for you depends on the size and strength of your veins. A fistula is usually the preferred type of access. It can last several years and is less likely than the other types of accesses to become infected or to cause blood clots within a blood vessel (thrombosis). However, a fistula is not an option for everyone. If your veins are not the right size, a graft may be used instead. Grafts require you to have strong veins. If your veins are not strong enough for a graft, a catheter may be used. Catheters are more likely than fistulas and grafts to become infected or to have  thrombosis. Sometimes, only one type of access is an option. Your health care provider will help you determine which type of access is best for you. How is a vascular access used? The way the access is used depends on the type of access:  If the access is a fistula or graft, two needles are inserted through the skin into the access before each hemodialysis session. Blood leaves the body through one of the needles and travels through a tube to the hemodialysis machine (dialyzer). It then flows through another tube and returns to the body through the second needle.  If the access is a catheter, one tube is connected directly to the tube that leads to the dialyzer and the other is connected to a tube that leads away from the dialyzer. Blood leaves the body through one tube and returns to the body through the other.  What kind of problems can occur with vascular accesses?  Blood clots within a blood vessel (thrombosis). Thrombosis can lead to a narrowing of a blood vessel or tube (stenosis). If thrombosis occurs frequently, another access site may be created as a backup.  Infection. These problems are most likely to occur with a venous catheter and least likely to occur with an arteriovenous fistula. How do I care for my vascular access? Wear a medical alert bracelet. This tells health care providers that you are a dialysis patient in the case of an emergency and   allows them to care for your veins appropriately. If you have a graft or fistula:  A "bruit" is a noise that is heard with a stethoscope and a "thrill" is a vibration felt over the graft or fistula. The presence of the bruit and thrill indicates that the access is working. You will be taught to feel for the thrill each day. If this is not felt, the access may be clotted. Call your health care provider.  You may use the arm where your vascular access is located freely after the site heals. Keep the following in mind: ? Avoid pressure on the  arm. ? Avoid lifting heavy objects with the arm. ? Avoid sleeping on the arm. ? Avoid wearing tight-sleeved shirts or jewelry around the graft or fistula.  Do not allow blood pressure monitoring or needle punctures on the side where the graft or fistula is located.  With permission from your health care provider, you may do exercises to help with blood flow through a fistula. These exercises involve squeezing a rubber ball or other soft objects as instructed.  Contact a health care provider if:  Chills develop.  You have an oral temperature above 102 F (38.9 C).  Swelling around the graft or fistula gets worse.  New pain develops.  Pus or other fluid (drainage) is seen at the vascular access site.  Skin redness or red streaking is seen on the skin around, above, or below the vascular access. Get help right away if:  Pain, numbness, or an unusual pale skin color develops in the hand on the side of your fistula.  Dizziness or weakness develops that you have not had before.  The vascular access has bleeding that cannot be easily controlled. This information is not intended to replace advice given to you by your health care provider. Make sure you discuss any questions you have with your health care provider. Document Released: 07/01/2002 Document Revised: 09/16/2015 Document Reviewed: 08/27/2012 Elsevier Interactive Patient Education  2017 [employer]  

## 2017-12-28 NOTE — Assessment & Plan Note (Signed)
The patient has a difficult dialysis access situation.  He has been on dialysis for 20 years.  At this point, it is likely that her best access option will be some sort of revision of his right radiocephalic AV fistula if his venous outflow will accept this.  I think starting with a duplex on the right upper extremity and a fistulogram will help surgical planning.  I would be concerned about a central venous stenosis around his existing catheter, and this may have to be addressed or he may have to have some sort of hero graft on that side.  He says the typical grafts have gone very poorly, so an Artegraft is likely going to be a better option.  Given the complexity of the situation I think the preoperative planning is of paramount importance.  We will do this in the near future at his convenience and then go forward with creating a permanent access.

## 2017-12-28 NOTE — Assessment & Plan Note (Signed)
Wears foot drop splints.

## 2018-01-01 ENCOUNTER — Ambulatory Visit (INDEPENDENT_AMBULATORY_CARE_PROVIDER_SITE_OTHER): Payer: Medicare Other | Admitting: Vascular Surgery

## 2018-01-01 ENCOUNTER — Encounter (INDEPENDENT_AMBULATORY_CARE_PROVIDER_SITE_OTHER): Payer: Self-pay | Admitting: Vascular Surgery

## 2018-01-01 ENCOUNTER — Ambulatory Visit (INDEPENDENT_AMBULATORY_CARE_PROVIDER_SITE_OTHER): Payer: Medicare Other

## 2018-01-01 VITALS — BP 73/48 | HR 103 | Resp 17 | Ht 74.0 in | Wt 265.0 lb

## 2018-01-01 DIAGNOSIS — I10 Essential (primary) hypertension: Secondary | ICD-10-CM | POA: Diagnosis not present

## 2018-01-01 DIAGNOSIS — N186 End stage renal disease: Secondary | ICD-10-CM | POA: Diagnosis not present

## 2018-01-01 DIAGNOSIS — G610 Guillain-Barre syndrome: Secondary | ICD-10-CM | POA: Diagnosis not present

## 2018-01-01 DIAGNOSIS — Z992 Dependence on renal dialysis: Secondary | ICD-10-CM | POA: Diagnosis not present

## 2018-01-01 NOTE — Assessment & Plan Note (Signed)
The patient has a very difficult and challenging dialysis access situation.  His duplex today shows the right radiocephalic AV fistula to be patent in the distal forearm and then drained through what appeared to be basilic vein collaterals in the proximal forearm.  The basilic vein fistula in the upper arm eaten initially but then occludes in the upper arm.  I think at this point, a right upper extremity fistulogram/central venogram will be necessary to elucidate what our options would be for access in that arm.  We may even need to perform a venogram on the left arm if it looks like the options on the right arm are minimal.  This has been scheduled for next week.

## 2018-01-01 NOTE — Progress Notes (Signed)
MRN : [id number]  Patrick Downs is a 42 y.o. (03/08/1976) male who presents with chief complaint of  Chief Complaint  Patient presents with  . Follow-up    1week HDA  .  History of Present Illness: Patient returns today in follow up of dialysis access.  He has long-standing renal failure and a difficult dialysis access situation.  Duplex performed today to further evaluate our options for revision of his upper extremity access is that still have some flow.  His duplex today shows the right radiocephalic AV fistula to be patent in the distal forearm and then drained through what appeared to be basilic vein collaterals in the proximal forearm.  The basilic vein fistula in the upper arm eaten initially but then occludes in the upper arm.       Past Medical History:  Diagnosis Date  . Depression   . Dialysis patient ([place])   . ESRD (end stage renal disease) on dialysis ([place])   . Failure to thrive in adult   . Gout   . Guillain Barr syndrome ([place])   . Guillain-Barre ([place])   . HTN (hypertension)   . Kidney failure   . Pneumonia   . Renal insufficiency   . Respiratory failure [facility])          Past Surgical History:  Procedure Laterality Date  . AMPUTATION TOE Right 06/08/2017   Procedure: AMPUTATION TOE RIGHT FIFTH TOE;  Surgeon: Samara Deist, DPM;  Location: [facility] ORS;  Service: Podiatry;  Laterality: Right;  . AV FISTULA PLACEMENT     x5      2 graphs  . PARATHYROIDECTOMY    . RENAL BIOPSY    . tonsiilectomy    . tracheotomy           Family History  Problem Relation Age of Onset  . Diabetes Mellitus II Father   . Kidney disease Father   . Kidney failure Paternal Grandfather   . Prostate cancer Neg Hx   . Kidney cancer Neg Hx   . Bladder Cancer Neg Hx     Social History Social History        Tobacco Use  . Smoking status: Never Smoker  . Smokeless tobacco: Never Used  Substance Use Topics  . Alcohol use: No  . Drug  use: Yes    Types: Marijuana         Allergies  Allergen Reactions  . Ivp Dye [Iodinated Diagnostic Agents] Other (See Comments)    Reaction unknown  . Ondansetron Other (See Comments)    Stomach pain   . Minoxidil Other (See Comments)    "put fluid around my heart", PERICARDIAL EFFUSION  . Morphine Other (See Comments)    Makes patient aggressive  . Morphine And Related Other (See Comments)    Reaction unknown  . Omnipaque [Iohexol] Itching and Other (See Comments)    Rigors on one occasion, widespread itching on a separate occasion (resolved with Benadryl), tremors          Current Outpatient Medications  Medication Sig Dispense Refill  . calcium acetate (PHOSLO) 667 MG capsule Take 3 capsules (2,001 mg total) by mouth 3 (three) times daily with meals. 1 capsule 0  . gabapentin (NEURONTIN) 300 MG capsule Take 600 mg by mouth at bedtime.     . gabapentin (NEURONTIN) 300 MG capsule Take by mouth.    . midodrine (PROAMATINE) 5 MG tablet Take 1 tablet (5 mg total) by mouth 3 (three) times  daily with meals. 90 tablet 2  . oxyCODONE-acetaminophen (PERCOCET) 10-325 MG tablet Take 1 tablet by mouth every 4 (four) hours as needed for pain.   0  . sildenafil (REVATIO) 20 MG tablet Take 20-100 mg by mouth as needed (for ED).      No current facility-administered medications for this visit.       REVIEW OF SYSTEMS (Negative unless checked)  Constitutional: [] Weight loss  [] Fever  [] Chills Cardiac: [] Chest pain   [] Chest pressure   [] Palpitations   [] Shortness of breath when laying flat   [] Shortness of breath at rest   [] Shortness of breath with exertion. Vascular:  [] Pain in legs with walking   [] Pain in legs at rest   [] Pain in legs when laying flat   [] Claudication   [] Pain in feet when walking  [] Pain in feet at rest  [] Pain in feet when laying flat   [] History of DVT   [] Phlebitis   [] Swelling in legs   [] Varicose veins   [] Non-healing  ulcers Pulmonary:   [] Uses home oxygen   [] Productive cough   [] Hemoptysis   [] Wheeze  [] COPD   [] Asthma Neurologic:  [] Dizziness  [] Blackouts   [] Seizures   [] History of stroke   [] History of TIA  [] Aphasia   [] Temporary blindness   [] Dysphagia   [] Weakness or numbness in arms   [x] Weakness or numbness in legs Musculoskeletal:  [x] Arthritis   [] Joint swelling   [] Joint pain   [] Low back pain Hematologic:  [] Easy bruising  [] Easy bleeding   [] Hypercoagulable state   [x] Anemic  [] Hepatitis Gastrointestinal:  [] Blood in stool   [] Vomiting blood  [] Gastroesophageal reflux/heartburn   [] Abdominal pain Genitourinary:  [x] Chronic kidney disease   [] Difficult urination  [] Frequent urination  [] Burning with urination   [] Hematuria Skin:  [] Rashes   [] Ulcers   [] Wounds Psychological:  [] History of anxiety   [x]  History of major depression.    Physical Examination  BP (!) 73/48 (BP Location: Left Arm)   Pulse (!) 103   Resp 17   Ht 6\' 2"  (1.88 m)   Wt 265 lb (120.2 kg)   BMI 34.02 kg/m  Gen:  WD/WN, NAD Head: /AT, No temporalis wasting. Ear/Nose/Throat: Hearing grossly intact, nares w/o erythema or drainage Eyes: Conjunctiva clear. Sclera non-icteric Neck: Supple.  Trachea midline Pulmonary:  Good air movement, no use of accessory muscles.  Cardiac: RRR, no JVD Vascular: Right radiocephalic AV fistula is patent in the distal upper arm and drains to the basilic vein collaterals around the elbow Vessel Right Left  Radial Palpable Palpable                                    Musculoskeletal: No atrophy in his hands bilaterally and wearing foot drop splints bilaterally no edema. Neurologic: Sensation grossly intact in extremities.  Symmetrical.  Speech is fluent.  Psychiatric: Judgment intact, Mood & affect appropriate for pt's clinical situation. Dermatologic: No rashes or ulcers noted.  No cellulitis or open wounds.       Labs Recent Results (from the past 2160 hour(s))   Drug screen panel 1, serum     Status: Abnormal   Collection Time: 11/06/17  3:54 PM  Result Value Ref Range   Amphetamines, IA Negative Cutoff:50 ng/mL   Barbiturates, IA Negative Cutoff:0.1 ug/mL   Benzodiazepines, IA Negative Cutoff:20 ng/mL   Cocaine & Metabolite, IA Negative Cutoff:25 ng/mL   Phencyclidine, IA  Negative Cutoff:8 ng/mL   THC(Marijuana) Metabolite, IA ++POSITIVE++ (A) Cutoff:5 ng/mL   Opiates, IA ++POSITIVE++ (A) Cutoff:5 ng/mL   Oxycodones, IA Negative Cutoff:5 ng/mL   Methadone, IA Negative Cutoff:25 ng/mL   Propoxyphene, IA Negative Cutoff:50 ng/mL    Comment: This test was developed and its performance characteristics determined by [facility].  It has not been cleared or approved by the Food and Drug Administration.   THC,MS,WB/Sp Rfx     Status: None   Collection Time: 11/06/17  3:54 PM  Result Value Ref Range   Cannabinoid Confirmation Positive    Tetrahydrocannabinol(THC) 1.5 ng/mL   Carboxy-THC 8.0 ng/mL   Hydroxy-THC Negative ng/mL   Cannabinol Negative ng/mL   Cannabidiol Negative ng/mL    Comment: Confirmation threshold: 1.0 ng/mL  Opiates,MS,WB/Sp Rfx     Status: None   Collection Time: 11/06/17  3:54 PM  Result Value Ref Range   Opiate Confirmation Positive    Codeine Negative ng/mL   Morphine Negative ng/mL   6-Acetylmorphine Negative    Hydrocodone 1.5 ng/mL   Hydromorphone Negative ng/mL   Dihydrocodeine Negative ng/mL    Comment: Expected metabolism of opiate class drugs:  Parent Drug       Detected Metabolites  -----------       --------------------  Codeine:          Major:  Morphine                    Minor:  Hydrocodone, Hydromorphone,                            Dihydrocodeine  Morphine:         Minor:  Hydromorphone  Hydrocodone:      Hydromorphone, Dihydrocodeine  Hydromorphone:    None  Dihydrocodeine:   None  Heroin:           6-Acetylmorphine                    Morphine                    Codeine, in small amounts in  comparison                     to morphine, is often detected when                     heroin is the source drug. Confirmation threshold: 1.0 ng/mL   Oxycodones,MS,WB/Sp Rfx     Status: None   Collection Time: 11/06/17  3:54 PM  Result Value Ref Range   Oxycodones Confirmation Negative    Oxycocone Negative ng/mL   Oxymorphone Negative ng/mL    Comment: Confirmation threshold: 1.0 ng/mL    Radiology No results found.  Assessment/Plan HTN (hypertension) blood pressure control important in reducing the progression of atherosclerotic disease.  Now actually having some more issues with low blood pressure on dialysis.   Guillain Barr syndrome, lower ext weakness Wears foot drop splints.  ESRD on dialysis The patient has a very difficult and challenging dialysis access situation.  His duplex today shows the right radiocephalic AV fistula to be patent in the distal forearm and then drained through what appeared to be basilic vein collaterals in the proximal forearm.  The basilic vein fistula in the upper arm eaten initially but then occludes in the upper arm.  I think at this point, a right upper  extremity fistulogram/central venogram will be necessary to elucidate what our options would be for access in that arm.  We may even need to perform a venogram on the left arm if it looks like the options on the right arm are minimal.  This has been scheduled for next week.    Leotis Pain, MD  01/01/2018 4:40 PM    This note was created with Dragon medical transcription system.  Any errors from dictation are purely unintentional

## 2018-01-04 ENCOUNTER — Other Ambulatory Visit (INDEPENDENT_AMBULATORY_CARE_PROVIDER_SITE_OTHER): Payer: Self-pay | Admitting: Nurse Practitioner

## 2018-01-06 MED ORDER — CEFAZOLIN SODIUM-DEXTROSE 1-4 GM/50ML-% IV SOLN
1.0000 g | Freq: Once | INTRAVENOUS | Status: AC
  Administered 2018-01-07: 1 g via INTRAVENOUS

## 2018-01-07 ENCOUNTER — Encounter: Payer: Self-pay | Admitting: *Deleted

## 2018-01-07 ENCOUNTER — Ambulatory Visit
Admission: RE | Admit: 2018-01-07 | Discharge: 2018-01-07 | Disposition: A | Discharge status: Home / Self Care | Payer: Medicare Other | Source: Ambulatory Visit | Attending: Vascular Surgery | Admitting: Vascular Surgery

## 2018-01-07 ENCOUNTER — Encounter
Admission: RE | Disposition: A | Discharge status: Home / Self Care | Payer: Self-pay | Source: Ambulatory Visit | Attending: Vascular Surgery

## 2018-01-07 DIAGNOSIS — G610 Guillain-Barre syndrome: Secondary | ICD-10-CM | POA: Diagnosis not present

## 2018-01-07 DIAGNOSIS — I120 Hypertensive chronic kidney disease with stage 5 chronic kidney disease or end stage renal disease: Secondary | ICD-10-CM | POA: Insufficient documentation

## 2018-01-07 DIAGNOSIS — F329 Major depressive disorder, single episode, unspecified: Secondary | ICD-10-CM | POA: Insufficient documentation

## 2018-01-07 DIAGNOSIS — Y832 Surgical operation with anastomosis, bypass or graft as the cause of abnormal reaction of the patient, or of later complication, without mention of misadventure at the time of the procedure: Secondary | ICD-10-CM | POA: Insufficient documentation

## 2018-01-07 DIAGNOSIS — T82868A Thrombosis of vascular prosthetic devices, implants and grafts, initial encounter: Secondary | ICD-10-CM | POA: Insufficient documentation

## 2018-01-07 DIAGNOSIS — T82898A Other specified complication of vascular prosthetic devices, implants and grafts, initial encounter: Secondary | ICD-10-CM | POA: Diagnosis present

## 2018-01-07 DIAGNOSIS — N186 End stage renal disease: Secondary | ICD-10-CM

## 2018-01-07 DIAGNOSIS — Z885 Allergy status to narcotic agent status: Secondary | ICD-10-CM | POA: Diagnosis not present

## 2018-01-07 DIAGNOSIS — M109 Gout, unspecified: Secondary | ICD-10-CM | POA: Diagnosis not present

## 2018-01-07 DIAGNOSIS — Z91041 Radiographic dye allergy status: Secondary | ICD-10-CM | POA: Diagnosis not present

## 2018-01-07 DIAGNOSIS — Z89421 Acquired absence of other right toe(s): Secondary | ICD-10-CM | POA: Diagnosis not present

## 2018-01-07 HISTORY — PX: A/V FISTULAGRAM: CATH118298

## 2018-01-07 SURGERY — A/V FISTULAGRAM
Anesthesia: Moderate Sedation | Laterality: Right

## 2018-01-07 MED ORDER — DIPHENHYDRAMINE HCL 50 MG/ML IJ SOLN
INTRAMUSCULAR | Status: AC
  Filled 2018-01-07: qty 1

## 2018-01-07 MED ORDER — IOPAMIDOL (ISOVUE-300) INJECTION 61%
INTRAVENOUS | Status: DC | PRN
  Administered 2018-01-07: 20 mL via INTRAVENOUS

## 2018-01-07 MED ORDER — FAMOTIDINE 20 MG PO TABS
ORAL_TABLET | ORAL | Status: AC
  Administered 2018-01-07: 40 mg via ORAL
  Filled 2018-01-07: qty 2

## 2018-01-07 MED ORDER — MIDAZOLAM HCL 5 MG/5ML IJ SOLN
INTRAMUSCULAR | Status: AC
  Filled 2018-01-07: qty 5

## 2018-01-07 MED ORDER — HEPARIN SODIUM (PORCINE) 1000 UNIT/ML IJ SOLN
INTRAMUSCULAR | Status: AC
  Filled 2018-01-07: qty 1

## 2018-01-07 MED ORDER — DIPHENHYDRAMINE HCL 25 MG PO CAPS: 50.0000 mg | ORAL_CAPSULE | Freq: Once | ORAL | Status: AC

## 2018-01-07 MED ORDER — ONDANSETRON HCL 4 MG/2ML IJ SOLN: 4.0000 mg | Freq: Four times a day (QID) | INTRAMUSCULAR | Status: DC | PRN

## 2018-01-07 MED ORDER — FENTANYL CITRATE (PF) 100 MCG/2ML IJ SOLN: 12.5000 ug | Freq: Once | INTRAMUSCULAR | Status: DC | PRN

## 2018-01-07 MED ORDER — SODIUM CHLORIDE 0.9 % IV SOLN
INTRAVENOUS | Status: DC
  Administered 2018-01-07: 12:00:00 via INTRAVENOUS

## 2018-01-07 MED ORDER — METHYLPREDNISOLONE SODIUM SUCC 125 MG IJ SOLR
INTRAMUSCULAR | Status: AC
  Filled 2018-01-07: qty 2

## 2018-01-07 MED ORDER — HEPARIN (PORCINE) IN NACL 1000-0.9 UT/500ML-% IV SOLN
INTRAVENOUS | Status: AC
  Filled 2018-01-07: qty 1000

## 2018-01-07 MED ORDER — FAMOTIDINE 20 MG PO TABS
40.0000 mg | ORAL_TABLET | Freq: Once | ORAL | Status: AC
  Administered 2018-01-07: 40 mg via ORAL

## 2018-01-07 MED ORDER — FENTANYL CITRATE (PF) 100 MCG/2ML IJ SOLN
INTRAMUSCULAR | Status: AC
  Filled 2018-01-07: qty 2

## 2018-01-07 MED ORDER — METHYLPREDNISOLONE SODIUM SUCC 125 MG IJ SOLR
125.0000 mg | Freq: Once | INTRAMUSCULAR | Status: AC
  Administered 2018-01-07: 125 mg via INTRAVENOUS

## 2018-01-07 MED ORDER — DIPHENHYDRAMINE HCL 50 MG/ML IJ SOLN
50.0000 mg | Freq: Once | INTRAMUSCULAR | Status: AC
  Administered 2018-01-07: 50 mg via INTRAVENOUS

## 2018-01-07 MED ORDER — MIDAZOLAM HCL 2 MG/2ML IJ SOLN
INTRAMUSCULAR | Status: DC | PRN
  Administered 2018-01-07: 2 mg via INTRAVENOUS

## 2018-01-07 MED ORDER — LIDOCAINE-EPINEPHRINE (PF) 1 %-1:200000 IJ SOLN
INTRAMUSCULAR | Status: AC
  Filled 2018-01-07: qty 30

## 2018-01-07 MED ORDER — FENTANYL CITRATE (PF) 100 MCG/2ML IJ SOLN
INTRAMUSCULAR | Status: DC | PRN
  Administered 2018-01-07: 50 ug via INTRAVENOUS

## 2018-01-07 SURGICAL SUPPLY — 5 items
CANNULA 5F STIFF (CANNULA) ×3 IMPLANT
DRAPE BRACHIAL (DRAPES) ×3 IMPLANT
PACK ANGIOGRAPHY (CUSTOM PROCEDURE TRAY) ×3 IMPLANT
SHEATH BRITE TIP 6FRX5.5 (SHEATH) ×3 IMPLANT
SUT MNCRL AB 4-0 PS2 18 (SUTURE) ×3 IMPLANT

## 2018-01-07 NOTE — Op Note (Signed)
[facility]    OPERATIVE NOTE   PROCEDURE: 1.   Right radiocephalic arteriovenous fistula cannulation under ultrasound guidance 2.   Right arm fistulagram including central venogram   PRE-OPERATIVE DIAGNOSIS: 1. ESRD 2. Poorly functional right radiocephalic AVF  POST-OPERATIVE DIAGNOSIS: same as above   SURGEON: Leotis Pain, MD  ANESTHESIA: local with MCS  ESTIMATED BLOOD LOSS: 2 cc  FINDING(S): 1. The right radiocephalic AV fistula drains through collaterals in the mid forearm to the basilic vein.  In the upper arm, the basilic vein is occluded and reconstituted just before its confluence into the axillary vein.  The brachial vein, however, appears to be patent and has some of the collaterals draining into this as well.  This does not appear to have any focal stenoses.  The flow in the central venous circulation appear to be widely patent even with the PermCath in place.  SPECIMEN(S):  None  CONTRAST: 20 cc  FLUORO TIME: 0.3 minutes  MODERATE CONSCIOUS SEDATION TIME: Approximately 15 minutes with 2 mg of Versed and 50 Mcg of Fentanyl   INDICATIONS: Patrick Downs is a 42 y.o. male who presents with malfunctioning right radiocephalic arteriovenous fistula.  The patient is scheduled for right arm fistulagram.  The patient is aware the risks include but are not limited to: bleeding, infection, thrombosis of the cannulated access, and possible anaphylactic reaction to the contrast.  The patient is aware of the risks of the procedure and elects to proceed forward.  DESCRIPTION: After full informed written consent was obtained, the patient was brought back to the angiography suite and placed supine upon the angiography table.  The patient was connected to monitoring equipment. Moderate conscious sedation was administered with a face to face encounter with the patient throughout the procedure with my supervision of the RN administering medicines and monitoring  the patient's vital signs and mental status throughout from the start of the procedure until the patient was taken to the recovery room. The right arm was prepped and draped in the standard fashion for a percutaneous access intervention.  Under ultrasound guidance, the right radiocephalic arteriovenous fistula was cannulated with a micropuncture needle under direct ultrasound guidance and a permanent image was performed.  The microwire was advanced into the fistula and the needle was exchanged for the a microsheath.  Through the micropuncture sheath imaging was performed to include the entire right upper extremity all the way to the central venous circulation to evaluate the fistula and what options we would have for salvage. The right radiocephalic AV fistula drains through collaterals in the mid forearm to the basilic vein.  In the upper arm, the basilic vein is occluded and reconstituted just before its confluence into the axillary vein.  The brachial vein, however, appears to be patent and has some of the collaterals draining into this as well.  This does not appear to have any focal stenoses.  The flow in the central venous circulation appear to be widely patent even with the PermCath in place.  This would appear to be amenable to an Artegraft jump graft from the radiocephalic AV fistula to the brachial vein or the axillary vein in the upper arm.   Based on the completion imaging, no further intervention is necessary today. A 4-0 Monocryl purse-string suture was sewn around the sheath.  The sheath was removed while tying down the suture.  A sterile bandage was applied to the puncture site.  COMPLICATIONS: None  CONDITION: Stable  Leotis Pain  01/07/2018 12:46 PM   This note was created with Dragon Medical transcription system. Any errors in dictation are purely unintentional.

## 2018-01-07 NOTE — Discharge Instructions (Signed)
Fistulogram, Care After °Refer to this sheet in the next few weeks. These instructions provide you with information on caring for yourself after your procedure. Your health care provider may also give you more specific instructions. Your treatment has been planned according to current medical practices, but problems sometimes occur. Call your health care provider if you have any problems or questions after your procedure. °What can I expect after the procedure? °After your procedure, it is typical to have the following: °· A small amount of discomfort in the area where the catheters were placed. °· A small amount of bruising around the fistula. °· Sleepiness and fatigue. ° °Follow these instructions at home: °· Rest at home for the day following your procedure. °· Do not drive or operate heavy machinery while taking pain medicine. °· Take medicines only as directed by your health care provider. °· Do not take baths, swim, or use a hot tub until your health care provider approves. You may shower 24 hours after the procedure or as directed by your health care provider. °· There are many different ways to close and cover an incision, including stitches, skin glue, and adhesive strips. Follow your health care provider's instructions on: °? Incision care. °? Bandage (dressing) changes and removal. °? Incision closure removal. °· Monitor your dialysis fistula carefully. °Contact a health care provider if: °· You have drainage, redness, swelling, or pain at your catheter site. °· You have a fever. °· You have chills. °Get help right away if: °· You feel weak. °· You have trouble balancing. °· You have trouble moving your arms or legs. °· You have problems with your speech or vision. °· You can no longer feel a vibration or buzz when you put your fingers over your dialysis fistula. °· The limb that was used for the procedure: °? Swells. °? Is painful. °? Is cold. °? Is discolored, such as blue or pale white. °This  information is not intended to replace advice given to you by your health care provider. Make sure you discuss any questions you have with your health care provider. °Document Released: 08/25/2013 Document Revised: 09/16/2015 Document Reviewed: 05/30/2013 °Elsevier Interactive Patient Education © 2018 [employer] ° ° °Moderate Conscious Sedation, Adult, Care After °These instructions provide you with information about caring for yourself after your procedure. Your health care provider may also give you more specific instructions. Your treatment has been planned according to current medical practices, but problems sometimes occur. Call your health care provider if you have any problems or questions after your procedure. °What can I expect after the procedure? °After your procedure, it is common: °· To feel sleepy for several hours. °· To feel clumsy and have poor balance for several hours. °· To have poor judgment for several hours. °· To vomit if you eat too soon. ° °Follow these instructions at home: °For at least 24 hours after the procedure: ° °· Do not: °? Participate in activities where you could fall or become injured. °? Drive. °? Use heavy machinery. °? Drink alcohol. °? Take sleeping pills or medicines that cause drowsiness. °? Make important decisions or sign legal documents. °? Take care of children on your own. °· Rest. °Eating and drinking °· Follow the diet recommended by your health care provider. °· If you vomit: °? Drink water, juice, or soup when you can drink without vomiting. °? Make sure you have little or no nausea before eating solid foods. °General instructions °· Have a responsible adult stay   with you until you are awake and alert. °· Take over-the-counter and prescription medicines only as told by your health care provider. °· If you smoke, do not smoke without supervision. °· Keep all follow-up visits as told by your health care provider. This is important. °Contact a health care  provider if: °· You keep feeling nauseous or you keep vomiting. °· You feel light-headed. °· You develop a rash. °· You have a fever. °Get help right away if: °· You have trouble breathing. °This information is not intended to replace advice given to you by your health care provider. Make sure you discuss any questions you have with your health care provider. °Document Released: 01/29/2013 Document Revised: 09/13/2015 Document Reviewed: 07/31/2015 °Elsevier Interactive Patient Education © 2018 [employer] ° °

## 2018-01-08 ENCOUNTER — Ambulatory Visit: Payer: Medicare Other | Attending: Anesthesiology | Admitting: Anesthesiology

## 2018-01-08 ENCOUNTER — Ambulatory Visit (INDEPENDENT_AMBULATORY_CARE_PROVIDER_SITE_OTHER): Payer: Medicare Other | Admitting: Vascular Surgery

## 2018-01-08 ENCOUNTER — Other Ambulatory Visit: Payer: Self-pay

## 2018-01-08 ENCOUNTER — Encounter: Payer: Self-pay | Admitting: Vascular Surgery

## 2018-01-08 VITALS — BP 82/49 | HR 103 | Temp 98.5°F | Resp 16 | Ht 74.0 in | Wt 260.0 lb

## 2018-01-08 DIAGNOSIS — M545 Low back pain, unspecified: Secondary | ICD-10-CM

## 2018-01-08 DIAGNOSIS — N186 End stage renal disease: Secondary | ICD-10-CM

## 2018-01-08 DIAGNOSIS — Z79899 Other long term (current) drug therapy: Secondary | ICD-10-CM | POA: Insufficient documentation

## 2018-01-08 DIAGNOSIS — G610 Guillain-Barre syndrome: Secondary | ICD-10-CM

## 2018-01-08 DIAGNOSIS — M47816 Spondylosis without myelopathy or radiculopathy, lumbar region: Secondary | ICD-10-CM | POA: Diagnosis not present

## 2018-01-08 DIAGNOSIS — G894 Chronic pain syndrome: Secondary | ICD-10-CM | POA: Diagnosis not present

## 2018-01-08 DIAGNOSIS — R29898 Other symptoms and signs involving the musculoskeletal system: Secondary | ICD-10-CM

## 2018-01-08 DIAGNOSIS — G8929 Other chronic pain: Secondary | ICD-10-CM

## 2018-01-08 DIAGNOSIS — R531 Weakness: Secondary | ICD-10-CM | POA: Diagnosis not present

## 2018-01-08 DIAGNOSIS — M5136 Other intervertebral disc degeneration, lumbar region: Secondary | ICD-10-CM

## 2018-01-08 DIAGNOSIS — Z992 Dependence on renal dialysis: Secondary | ICD-10-CM | POA: Diagnosis not present

## 2018-01-08 DIAGNOSIS — F1190 Opioid use, unspecified, uncomplicated: Secondary | ICD-10-CM

## 2018-01-08 DIAGNOSIS — Z79891 Long term (current) use of opiate analgesic: Secondary | ICD-10-CM | POA: Insufficient documentation

## 2018-01-08 DIAGNOSIS — M51369 Other intervertebral disc degeneration, lumbar region without mention of lumbar back pain or lower extremity pain: Secondary | ICD-10-CM

## 2018-01-08 MED ORDER — HYDROCODONE-ACETAMINOPHEN 5-325 MG PO TABS: 1.0000 | ORAL_TABLET | Freq: Three times a day (TID) | ORAL | 0 refills | Status: DC

## 2018-01-08 NOTE — Progress Notes (Signed)
Safety precautions to be maintained throughout the outpatient stay will include: orient to surroundings, keep bed in low position, maintain call bell within reach at all times, provide assistance with transfer out of bed and ambulation.  

## 2018-01-08 NOTE — Patient Instructions (Signed)
Script in hand for Hydrocodone to last for 2 weeks.

## 2018-01-08 NOTE — Progress Notes (Signed)
Subjective:  Patient ID: Patrick Downs, male    DOB: July 02, 1975  Age: 42 y.o. MRN: [id number]  CC: Back Pain (low)   Procedure: None  HPI Patrick Downs presents for reevaluation.  He is continued to have severe low back pain that is incapacitating with radiation down his legs.  He is still reporting no change in lower extremity strength or function beyond his baseline with profound weakness following a bout with Guyon Barr.  He has undergone serum drug screening today and denies any illicit use of marijuana.  He reports that he has used marijuana for pain control in the past.  He has also been on oxycodone 10 mg 3 times a day for pain control and has been out of this for a considerable period of time.  He is not sleeping at night and has frequent pain throughout the day that he considers incapacitating rated at a VAS score of 10.  Otherwise no other changes are reported today.  He has been taking his gabapentin Midrin and occasional Tylenol for breakthrough pain.  At this point he states that he is quite miserable.  No other changes are reported.  Outpatient Medications Prior to Visit  Medication Sig Dispense Refill  . calcium acetate (PHOSLO) 667 MG capsule Take 3 capsules (2,001 mg total) by mouth 3 (three) times daily with meals. 1 capsule 0  . gabapentin (NEURONTIN) 300 MG capsule Take 600 mg by mouth at bedtime.     . midodrine (PROAMATINE) 5 MG tablet Take 1 tablet (5 mg total) by mouth 3 (three) times daily with meals. 90 tablet 2  . sildenafil (REVATIO) 20 MG tablet Take 20-100 mg by mouth as needed (for ED).     Marland Kitchen gabapentin (NEURONTIN) 300 MG capsule Take by mouth.    . oxyCODONE-acetaminophen (PERCOCET) 10-325 MG tablet Take 1 tablet by mouth every 4 (four) hours as needed for pain.   0   No facility-administered medications prior to visit.     Review of Systems CNS: No confusion or sedation Cardiac: No angina or palpitations GI: No abdominal pain or  constipation Constitutional: No nausea vomiting fevers or chills  Objective:  BP (!) 82/49   Pulse (!) 103   Temp 98.5 F (36.9 C)   Resp 16   Ht 6\' 2"  (1.88 m)   Wt 260 lb (117.9 kg)   SpO2 98%   BMI 33.38 kg/m    BP Readings from Last 3 Encounters:  01/08/18 (!) 82/49  01/07/18 (!) 113/50  01/01/18 (!) 73/48     Wt Readings from Last 3 Encounters:  01/08/18 260 lb (117.9 kg)  01/07/18 250 lb (113.4 kg)  01/01/18 265 lb (120.2 kg)     Physical Exam Pt is alert and oriented PERRL EOMI HEART IS RRR no murmur or rub LCTA no wheezing or rales MUSCULOSKELETAL reveals the patient to be ambulating at baseline.  No other changes in his lower extremity examination reported from baseline.  Labs  Lab Results  Component Value Date   HGBA1C 5.5 08/16/2017   Lab Results  Component Value Date   CREATININE 15.12 (H) 08/17/2017    -------------------------------------------------------------------------------------------------------------------- Lab Results  Component Value Date   WBC 6.0 08/17/2017   HGB 11.3 (L) 08/17/2017   HCT 34.2 (L) 08/17/2017   PLT 134 (L) 08/17/2017   GLUCOSE 136 (H) 08/17/2017   TRIG 673 (H) 08/21/2014   ALT 21 08/16/2017   AST 35 08/16/2017   NA 139 08/17/2017  K 4.8 08/17/2017   CL 96 (L) 08/17/2017   CREATININE 15.12 (H) 08/17/2017   BUN 87 (H) 08/17/2017   CO2 25 08/17/2017   TSH 5.560 (H) 08/27/2014   INR 1.46 08/31/2014   HGBA1C 5.5 08/16/2017    --------------------------------------------------------------------------------------------------------------------- No results found.   Assessment & Plan:   Patrick Downs was seen today for back pain.  Diagnoses and all orders for this visit:  DDD (degenerative disc disease), lumbar  Chronic bilateral low back pain without sciatica  Facet arthritis of lumbar region  Weakness of both lower extremities  Chronic, continuous use of opioids  Chronic pain syndrome  ESRD on  dialysis ([place])  Guillain-Barre syndrome (H[facility])  Other orders -     HYDROcodone-acetaminophen (NORCO/VICODIN) 5-325 MG tablet; Take 1 tablet by mouth 3 (three) times daily for 14 days.        ----------------------------------------------------------------------------------------------------------------------  Problem List Items Addressed This Visit      Unprioritized   ESRD on dialysis [facility])    Other Visit Diagnoses    DDD (degenerative disc disease), lumbar    -  Primary   Relevant Medications   HYDROcodone-acetaminophen (NORCO/VICODIN) 5-325 MG tablet   Chronic bilateral low back pain without sciatica       Relevant Medications   HYDROcodone-acetaminophen (NORCO/VICODIN) 5-325 MG tablet   Facet arthritis of lumbar region       Relevant Medications   HYDROcodone-acetaminophen (NORCO/VICODIN) 5-325 MG tablet   Weakness of both lower extremities       Chronic, continuous use of opioids       Chronic pain syndrome       Guillain-Barre syndrome ([place])            ----------------------------------------------------------------------------------------------------------------------  1. DDD (degenerative disc disease), lumbar I talked him about core stretching strengthening exercises for his low back.  We have requested physical therapy however he is presently not doing these exercises.  2. Chronic bilateral low back pain without sciatica   3. Facet arthritis of lumbar region   4. Weakness of both lower extremities Per baseline and continue follow-up with his primary care physicians and neurologist for care  5. Chronic, continuous use of opioids I am going to initiate Vicodin 5 mg tablet 3 times daily for baseline control.  I have him return to clinic in 2 weeks for reevaluation.  6. Chronic pain syndrome We have reviewed the [facility] practitioner database information and it is appropriate.  7. ESRD on dialysis [facility]) Continue with his nephrologist and continue  follow-up with Dr. Lucky Cowboy  8. Guillain-Barre syndrome ([place])     ----------------------------------------------------------------------------------------------------------------------  I am having Patrick Downs start on HYDROcodone-acetaminophen. I am also having him maintain his calcium acetate, gabapentin, midodrine, sildenafil, oxyCODONE-acetaminophen, and gabapentin.   Meds ordered this encounter  Medications  . HYDROcodone-acetaminophen (NORCO/VICODIN) 5-325 MG tablet    Sig: Take 1 tablet by mouth 3 (three) times daily for 14 days.    Dispense:  42 tablet    Refill:  0   Patient's Medications  New Prescriptions   HYDROCODONE-ACETAMINOPHEN (NORCO/VICODIN) 5-325 MG TABLET    Take 1 tablet by mouth 3 (three) times daily for 14 days.  Previous Medications   CALCIUM ACETATE (PHOSLO) 667 MG CAPSULE    Take 3 capsules (2,001 mg total) by mouth 3 (three) times daily with meals.   GABAPENTIN (NEURONTIN) 300 MG CAPSULE    Take 600 mg by mouth at bedtime.    GABAPENTIN (NEURONTIN) 300 MG CAPSULE    Take  by mouth.   MIDODRINE (PROAMATINE) 5 MG TABLET    Take 1 tablet (5 mg total) by mouth 3 (three) times daily with meals.   OXYCODONE-ACETAMINOPHEN (PERCOCET) 10-325 MG TABLET    Take 1 tablet by mouth every 4 (four) hours as needed for pain.    SILDENAFIL (REVATIO) 20 MG TABLET    Take 20-100 mg by mouth as needed (for ED).   Modified Medications   No medications on file  Discontinued Medications   No medications on file   ----------------------------------------------------------------------------------------------------------------------  Follow-up: Return in about 2 weeks (around 01/22/2018) for med refill, evaluation.    Molli Barrows, MD

## 2018-01-09 ENCOUNTER — Encounter (INDEPENDENT_AMBULATORY_CARE_PROVIDER_SITE_OTHER): Payer: Self-pay

## 2018-01-09 DIAGNOSIS — M51369 Other intervertebral disc degeneration, lumbar region without mention of lumbar back pain or lower extremity pain: Secondary | ICD-10-CM | POA: Insufficient documentation

## 2018-01-09 DIAGNOSIS — M5136 Other intervertebral disc degeneration, lumbar region: Secondary | ICD-10-CM | POA: Insufficient documentation

## 2018-01-14 ENCOUNTER — Other Ambulatory Visit (INDEPENDENT_AMBULATORY_CARE_PROVIDER_SITE_OTHER): Payer: Self-pay | Admitting: Nurse Practitioner

## 2018-01-17 ENCOUNTER — Inpatient Hospital Stay: Admission: RE | Admit: 2018-01-17 | Payer: Medicare Other | Source: Ambulatory Visit

## 2018-01-18 ENCOUNTER — Inpatient Hospital Stay: Admission: RE | Admit: 2018-01-18 | Payer: Medicare Other | Source: Ambulatory Visit

## 2018-01-19 ENCOUNTER — Encounter: Payer: Self-pay | Admitting: Emergency Medicine

## 2018-01-19 ENCOUNTER — Other Ambulatory Visit: Payer: Self-pay

## 2018-01-19 ENCOUNTER — Emergency Department: Payer: Medicare Other

## 2018-01-19 ENCOUNTER — Inpatient Hospital Stay
Admission: EM | Admit: 2018-01-19 | Discharge: 2018-01-23 | DRG: 871 | Disposition: A | Discharge status: Home / Self Care | Payer: Medicare Other | Attending: Family Medicine | Admitting: Family Medicine

## 2018-01-19 DIAGNOSIS — Z888 Allergy status to other drugs, medicaments and biological substances status: Secondary | ICD-10-CM | POA: Diagnosis not present

## 2018-01-19 DIAGNOSIS — I9589 Other hypotension: Secondary | ICD-10-CM | POA: Diagnosis present

## 2018-01-19 DIAGNOSIS — R1013 Epigastric pain: Secondary | ICD-10-CM | POA: Diagnosis not present

## 2018-01-19 DIAGNOSIS — G629 Polyneuropathy, unspecified: Secondary | ICD-10-CM | POA: Diagnosis present

## 2018-01-19 DIAGNOSIS — I5030 Unspecified diastolic (congestive) heart failure: Secondary | ICD-10-CM | POA: Diagnosis not present

## 2018-01-19 DIAGNOSIS — Z833 Family history of diabetes mellitus: Secondary | ICD-10-CM

## 2018-01-19 DIAGNOSIS — Z79899 Other long term (current) drug therapy: Secondary | ICD-10-CM

## 2018-01-19 DIAGNOSIS — Z89421 Acquired absence of other right toe(s): Secondary | ICD-10-CM | POA: Diagnosis not present

## 2018-01-19 DIAGNOSIS — A419 Sepsis, unspecified organism: Secondary | ICD-10-CM | POA: Diagnosis present

## 2018-01-19 DIAGNOSIS — I959 Hypotension, unspecified: Secondary | ICD-10-CM

## 2018-01-19 DIAGNOSIS — N186 End stage renal disease: Secondary | ICD-10-CM | POA: Diagnosis present

## 2018-01-19 DIAGNOSIS — I120 Hypertensive chronic kidney disease with stage 5 chronic kidney disease or end stage renal disease: Secondary | ICD-10-CM | POA: Diagnosis present

## 2018-01-19 DIAGNOSIS — R109 Unspecified abdominal pain: Secondary | ICD-10-CM

## 2018-01-19 DIAGNOSIS — D631 Anemia in chronic kidney disease: Secondary | ICD-10-CM | POA: Diagnosis present

## 2018-01-19 DIAGNOSIS — Z992 Dependence on renal dialysis: Secondary | ICD-10-CM

## 2018-01-19 DIAGNOSIS — N2581 Secondary hyperparathyroidism of renal origin: Secondary | ICD-10-CM | POA: Diagnosis present

## 2018-01-19 DIAGNOSIS — Z91041 Radiographic dye allergy status: Secondary | ICD-10-CM | POA: Diagnosis not present

## 2018-01-19 DIAGNOSIS — R52 Pain, unspecified: Secondary | ICD-10-CM

## 2018-01-19 DIAGNOSIS — R509 Fever, unspecified: Secondary | ICD-10-CM | POA: Diagnosis present

## 2018-01-19 DIAGNOSIS — Z8614 Personal history of Methicillin resistant Staphylococcus aureus infection: Secondary | ICD-10-CM | POA: Diagnosis not present

## 2018-01-19 DIAGNOSIS — Z885 Allergy status to narcotic agent status: Secondary | ICD-10-CM | POA: Diagnosis not present

## 2018-01-19 LAB — CBC WITH DIFFERENTIAL/PLATELET
BASOS ABS: 0.1 10*3/uL (ref 0–0.1)
Basophils Relative: 1 %
EOS ABS: 0.1 10*3/uL (ref 0–0.7)
Eosinophils Relative: 1 %
HCT: 41.1 % (ref 40.0–52.0)
Hemoglobin: 13.7 g/dL (ref 13.0–18.0)
LYMPHS ABS: 0.4 10*3/uL — AB (ref 1.0–3.6)
LYMPHS PCT: 4 %
MCH: 31 pg (ref 26.0–34.0)
MCHC: 33.4 g/dL (ref 32.0–36.0)
MCV: 92.9 fL (ref 80.0–100.0)
MONO ABS: 1 10*3/uL (ref 0.2–1.0)
Monocytes Relative: 9 %
NEUTROS PCT: 85 %
Neutro Abs: 10 10*3/uL — ABNORMAL HIGH (ref 1.4–6.5)
PLATELETS: 101 10*3/uL — AB (ref 150–440)
RBC: 4.43 MIL/uL (ref 4.40–5.90)
RDW: 19.1 % — AB (ref 11.5–14.5)
WBC: 11.7 10*3/uL — AB (ref 3.8–10.6)

## 2018-01-19 LAB — LACTIC ACID, PLASMA
LACTIC ACID, VENOUS: 1.7 mmol/L (ref 0.5–1.9)
Lactic Acid, Venous: 2 mmol/L (ref 0.5–1.9)

## 2018-01-19 LAB — COMPREHENSIVE METABOLIC PANEL
ALT: 27 U/L (ref 0–44)
ANION GAP: 14 (ref 5–15)
AST: 28 U/L (ref 15–41)
Albumin: 4.8 g/dL (ref 3.5–5.0)
Alkaline Phosphatase: 61 U/L (ref 38–126)
BILIRUBIN TOTAL: 1 mg/dL (ref 0.3–1.2)
BUN: 25 mg/dL — ABNORMAL HIGH (ref 6–20)
CHLORIDE: 95 mmol/L — AB (ref 98–111)
CO2: 28 mmol/L (ref 22–32)
Calcium: 10.8 mg/dL — ABNORMAL HIGH (ref 8.9–10.3)
Creatinine, Ser: 8.26 mg/dL — ABNORMAL HIGH (ref 0.61–1.24)
GFR, EST AFRICAN AMERICAN: 8 mL/min — AB (ref >60)
GFR, EST NON AFRICAN AMERICAN: 7 mL/min — AB (ref >60)
Glucose, Bld: 121 mg/dL — ABNORMAL HIGH (ref 70–99)
POTASSIUM: 5.1 mmol/L (ref 3.5–5.1)
Sodium: 137 mmol/L (ref 135–145)
TOTAL PROTEIN: 9 g/dL — AB (ref 6.5–8.1)

## 2018-01-19 LAB — INFLUENZA PANEL BY PCR (TYPE A & B)
INFLAPCR: NEGATIVE
Influenza B By PCR: NEGATIVE

## 2018-01-19 LAB — MRSA PCR SCREENING: MRSA BY PCR: NEGATIVE

## 2018-01-19 MED ORDER — ONDANSETRON HCL 4 MG PO TABS: 4.0000 mg | ORAL_TABLET | Freq: Four times a day (QID) | ORAL | Status: DC | PRN

## 2018-01-19 MED ORDER — PIPERACILLIN-TAZOBACTAM 3.375 G IVPB
3.3750 g | Freq: Two times a day (BID) | INTRAVENOUS | Status: DC
  Administered 2018-01-20 – 2018-01-22 (×5): 3.375 g via INTRAVENOUS
  Filled 2018-01-19 (×5): qty 50

## 2018-01-19 MED ORDER — VANCOMYCIN HCL IN DEXTROSE 1-5 GM/200ML-% IV SOLN: 1000.0000 mg | Freq: Once | INTRAVENOUS | Status: DC

## 2018-01-19 MED ORDER — MIDODRINE HCL 5 MG PO TABS
5.0000 mg | ORAL_TABLET | Freq: Three times a day (TID) | ORAL | Status: DC
  Administered 2018-01-19 – 2018-01-20 (×2): 5 mg via ORAL
  Filled 2018-01-19 (×3): qty 1

## 2018-01-19 MED ORDER — HEPARIN SODIUM (PORCINE) 5000 UNIT/ML IJ SOLN
5000.0000 [IU] | Freq: Three times a day (TID) | INTRAMUSCULAR | Status: DC
  Administered 2018-01-19 – 2018-01-23 (×11): 5000 [IU] via SUBCUTANEOUS
  Filled 2018-01-19 (×11): qty 1

## 2018-01-19 MED ORDER — PIPERACILLIN-TAZOBACTAM 3.375 G IVPB 30 MIN
3.3750 g | Freq: Once | INTRAVENOUS | Status: AC
  Administered 2018-01-19: 3.375 g via INTRAVENOUS
  Filled 2018-01-19: qty 50

## 2018-01-19 MED ORDER — ACETAMINOPHEN 650 MG RE SUPP: 650.0000 mg | Freq: Four times a day (QID) | RECTAL | Status: DC | PRN

## 2018-01-19 MED ORDER — VANCOMYCIN HCL IN DEXTROSE 1-5 GM/200ML-% IV SOLN
1000.0000 mg | INTRAVENOUS | Status: DC
  Administered 2018-01-22: 1000 mg via INTRAVENOUS
  Filled 2018-01-19 (×4): qty 200

## 2018-01-19 MED ORDER — GABAPENTIN 300 MG PO CAPS
600.0000 mg | ORAL_CAPSULE | Freq: Every day | ORAL | Status: DC
  Administered 2018-01-19 – 2018-01-22 (×4): 600 mg via ORAL
  Filled 2018-01-19 (×4): qty 2

## 2018-01-19 MED ORDER — ONDANSETRON HCL 4 MG/2ML IJ SOLN: 4.0000 mg | Freq: Four times a day (QID) | INTRAMUSCULAR | Status: DC | PRN

## 2018-01-19 MED ORDER — DOCUSATE SODIUM 100 MG PO CAPS
100.0000 mg | ORAL_CAPSULE | Freq: Two times a day (BID) | ORAL | Status: DC
  Administered 2018-01-19 – 2018-01-23 (×7): 100 mg via ORAL
  Filled 2018-01-19 (×8): qty 1

## 2018-01-19 MED ORDER — SODIUM CHLORIDE 0.9 % IV BOLUS
1000.0000 mL | Freq: Once | INTRAVENOUS | Status: AC
  Administered 2018-01-19: 1000 mL via INTRAVENOUS

## 2018-01-19 MED ORDER — KETOROLAC TROMETHAMINE 30 MG/ML IJ SOLN
30.0000 mg | Freq: Once | INTRAMUSCULAR | Status: AC
  Administered 2018-01-19: 30 mg via INTRAVENOUS
  Filled 2018-01-19: qty 1

## 2018-01-19 MED ORDER — VANCOMYCIN HCL IN DEXTROSE 1-5 GM/200ML-% IV SOLN
1000.0000 mg | Freq: Once | INTRAVENOUS | Status: AC
  Administered 2018-01-19: 1000 mg via INTRAVENOUS
  Filled 2018-01-19: qty 200

## 2018-01-19 MED ORDER — ACETAMINOPHEN 325 MG PO TABS: 650.0000 mg | ORAL_TABLET | Freq: Four times a day (QID) | ORAL | Status: DC | PRN

## 2018-01-19 MED ORDER — HYDROCODONE-ACETAMINOPHEN 5-325 MG PO TABS
ORAL_TABLET | ORAL | Status: AC
  Administered 2018-01-19: 1 via ORAL
  Filled 2018-01-19: qty 1

## 2018-01-19 MED ORDER — PIPERACILLIN-TAZOBACTAM 3.375 G IVPB 30 MIN: 3.3750 g | Freq: Four times a day (QID) | INTRAVENOUS | Status: DC

## 2018-01-19 MED ORDER — BISACODYL 5 MG PO TBEC: 5.0000 mg | DELAYED_RELEASE_TABLET | Freq: Every day | ORAL | Status: DC | PRN

## 2018-01-19 MED ORDER — SODIUM CHLORIDE 0.9 % IV SOLN
Freq: Once | INTRAVENOUS | Status: AC
  Administered 2018-01-19: 16:00:00 via INTRAVENOUS

## 2018-01-19 MED ORDER — HYDROCODONE-ACETAMINOPHEN 5-325 MG PO TABS
1.0000 | ORAL_TABLET | ORAL | Status: DC | PRN
  Administered 2018-01-19: 1 via ORAL
  Administered 2018-01-20 – 2018-01-23 (×7): 2 via ORAL
  Filled 2018-01-19: qty 1
  Filled 2018-01-19: qty 2
  Filled 2018-01-19: qty 1
  Filled 2018-01-19 (×5): qty 2

## 2018-01-19 NOTE — ED Notes (Signed)
Spoke with the admitting dr Charlynne Cousins regarding the patient's blood pressure. She will put in the orders that bp parameters are 70/40 or greater

## 2018-01-19 NOTE — ED Triage Notes (Signed)
Pt from dialysis - temp this am was 100.4 and was given 650 tylenol at 630am. After dialysis fever and chills restarted so dialysis called ems. Per pt symptoms started this am of bodyaches, chills and headache.

## 2018-01-19 NOTE — Progress Notes (Signed)
[place], [place] 01/19/18  Subjective:   Patient known to our practice from outpatient dialysis.  He was sent over from dialysis unit for fever and hypotension.  Patient states he has chronic hypotension and takes Midrin before dialysis.  This morning he forgot to take his medication.  In the emergency room, he was found to have a low-grade fever of 99.3.  His blood pressure is low in the 70s.  Patient state he has a bad headache.  No nausea or vomiting.  No chest pain or shortness of breath.  He was able to eat a sandwich in the ER without any problem.  Asking for double portions. I tried to call outpatient dialysis unit but it is closed.  Per ER report, temperature at dialysis was 100.4.  Patient had fever and chills after dialysis.  Objective:  Vital signs in last 24 hours:  Temp:  [99.3 F (37.4 C)] 99.3 F (37.4 C) (09/28 1150) Pulse Rate:  [103-119] 103 (09/28 1408) Resp:  [14-20] 15 (09/28 1330) BP: (73-112)/(28-77) 77/28 (09/28 1730) SpO2:  [76 %-100 %] 76 % (09/28 1408)  Weight change:  There were no vitals filed for this visit.  Intake/Output:    Intake/Output Summary (Last 24 hours) at 01/19/2018 1738 Last data filed at 01/19/2018 1331 Gross per 24 hour  Intake 500 ml  Output -  Net 500 ml     Physical Exam: General:  No acute distress, laying in the bed  HEENT  moist oral mucous membranes  Neck  supple  Pulm/lungs  normal breathing effort, clear to auscultation  CVS/Heart  regular rhythm, no rub or gallop  Abdomen:   Soft, nontender  Extremities:  No peripheral edema  Neurologic:  Alert, oriented  Skin:  No acute rashes  Access:  Right IJ PermCath       Basic Metabolic Panel:  Recent Labs  Lab 01/19/18 1227  NA 137  K 5.1  CL 95*  CO2 28  GLUCOSE 121*  BUN 25*  CREATININE 8.26*  CALCIUM 10.8*     CBC: Recent Labs  Lab 01/19/18 1227  WBC 11.7*  NEUTROABS 10.0*  HGB 13.7  HCT 41.1  MCV 92.9  PLT 101*       Lab Results  Component Value Date   HEPBSAG Negative 11/27/2014   HEPBSAB Reactive 09/28/2014   HEPBIGM NON REACTIVE 08/29/2014      Microbiology:  No results found for this or any previous visit (from the past 240 hour(s)).  Coagulation Studies: No results for input(s): LABPROT, INR in the last 72 hours.  Urinalysis: No results for input(s): COLORURINE, LABSPEC, PHURINE, GLUCOSEU, HGBUR, BILIRUBINUR, KETONESUR, PROTEINUR, UROBILINOGEN, NITRITE, LEUKOCYTESUR in the last 72 hours.  Invalid input(s): APPERANCEUR    Imaging: Dg Chest Portable 1 View  Result Date: 01/19/2018 CLINICAL DATA:  Fever EXAM: PORTABLE CHEST 1 VIEW COMPARISON:  August 16, 2017 FINDINGS: Central catheter tip at cavoatrial junction. No pneumothorax. No edema or consolidation. Heart size and pulmonary vascularity are normal. No adenopathy. There is a stent in the left axillary region. IMPRESSION: No edema or consolidation. Central catheter as described without pneumothorax. Stable cardiac silhouette. Electronically Signed   By: Lowella Grip III M.D.   On: 01/19/2018 14:29     Medications:   . piperacillin-tazobactam    . vancomycin 1,000 mg (01/19/18 1648)  . vancomycin    . [START ON 01/22/2018] vancomycin     . docusate sodium  100 mg Oral BID  .  gabapentin  600 mg Oral QHS  . heparin  5,000 Units Subcutaneous Q8H  . midodrine  5 mg Oral TID WC   acetaminophen **OR** acetaminophen, bisacodyl, HYDROcodone-acetaminophen, ondansetron **OR** ondansetron (ZOFRAN) IV  Assessment/ Plan:  42 y.o. male with end-stage renal disease, hypertension, history of Gullian Barr syndrome   [address]/ [facility] MWF/ 4.5 hrs.  1.  End-stage renal disease 2.  Anemia of chronic kidney disease 3.  Hypercalcemia 4.  Fever, low-grade ? Viral  5.  Chronic hypotension  Electrolytes and volume status are acceptable.  No acute indication for dialysis at present.  Patient has presumed diagnosis of sepsis  because of fever and mildly elevated lactic acid and WBC count.  He is being treated empirically with broad-spectrum antibiotics.  Blood cultures have been drawn.  Results are pending.  Flu test is negative for influenza a and B.  Hemoglobin above goal of 13.7. Recommend careful hydration to avoid fluid overload.  Patient had normal p.o. intake Avoid calcium containing phosphorus binders (taking calcium acetate at home) Currently hemoglobin above goal.  Hold EPO.  We will follow closely with you.     LOS: 0 Tram Wrenn Candiss Norse 9/28/20195:38 PM  [place], [place]  Note: This note was prepared with Dragon dictation. Any transcription errors are unintentional

## 2018-01-19 NOTE — ED Notes (Signed)
Spoke with [facility] and dr Archie Balboa - they were asking regarding antibiotics. Hold for now per dr. Archie Balboa.

## 2018-01-19 NOTE — ED Notes (Signed)
Call from [facility] - they understand the pt is a dialysis pt and did not take his meds today but his sepsis protocol states he needs 3500cc fluids. Admitting doctor aware and will make a note indicating she does not want fluids other than what is ordered.

## 2018-01-19 NOTE — ED Triage Notes (Signed)
Pt takes medication for hypotension and did not take this am. bp last week was 82/49

## 2018-01-19 NOTE — Progress Notes (Signed)
Pharmacy Antibiotic Note  Patrick Downs is a 42 y.o. male admitted on 01/19/2018 with sepsis.  Pharmacy has been consulted for Vancomycin dosing.  Plan:  Vancomycin 1 gm IV X 1 given in ED on 9/28 @ 16:48.   Vancomycin 1 gm IV X 1 ordered to be given following initial 1 gm dose to make total loading dose of 2 gm. Nephrology consult ordered for HD management, pt was on T-Th-Sat HD schedule at home but hospital HD schedule not currently set.  Will continue with T-Th-Sat dosing for now.  Will draw 1st Vanc trough before 3rd HD session on 10/5.      Temp (24hrs), Avg:99.3 F (37.4 C), Min:99.3 F (37.4 C), Max:99.3 F (37.4 C)  Recent Labs  Lab 01/19/18 1226 01/19/18 1227  WBC  --  11.7*  CREATININE  --  8.26*  LATICACIDVEN 2.0*  --     Estimated Creatinine Clearance: 16.1 mL/min (A) (by C-G formula based on SCr of 8.26 mg/dL (H)).    Allergies  Allergen Reactions  . Ivp Dye [Iodinated Diagnostic Agents] Other (See Comments)    Reaction unknown  . Ondansetron Other (See Comments)    Stomach pain   . Minoxidil Other (See Comments)    "put fluid around my heart", PERICARDIAL EFFUSION  . Morphine Other (See Comments)    Makes patient aggressive  . Morphine And Related Other (See Comments)    Reaction unknown  . Omnipaque [Iohexol] Itching and Other (See Comments)    Rigors on one occasion, widespread itching on a separate occasion (resolved with Benadryl), tremors    Antimicrobials this admission:   >>    >>   Dose adjustments this admission:   Microbiology results:  BCx:   UCx:    Sputum:    MRSA PCR:   Thank you for allowing pharmacy to be a part of this patient's care.  Jesslyn Viglione D 01/19/2018 5:38 PM

## 2018-01-19 NOTE — H&P (Signed)
[place] at [place] NAME: Patrick Downs    MR#:  [id number]  DATE OF BIRTH:  1975/12/01  DATE OF ADMISSION:  01/19/2018  PRIMARY CARE PHYSICIAN: Leonel Ramsay, MD   REQUESTING/REFERRING PHYSICIAN: Dr. Lisa Roca  CHIEF COMPLAINT: Fever   Chief Complaint  Patient presents with  . Code Sepsis    HISTORY OF PRESENT ILLNESS:  Patrick Downs  is a 42 y.o. male with a known history of ESRD on hemodialysis Tuesday, Thursday, Saturday had fever at dialysis today patient also noted to have hypotension.  Because of fever, hypotension patient was brought in here.  Patient has chronic hypotension, on midodrine and he forgot to take Midrin today.  They are noted to have lactic acid of 2 without evidence of infection chest x-ray has been negative, flu test has been negative.  Patient denies any complaints, no nausea, vomiting, diarrhea.  Appetite is good.  Has no cough, shortness of breath. Has dialysis access in the right anterior chest looks clean without evidence of infection.  No tenderness over the permacath site. PAST MEDICAL HISTORY:   Past Medical History:  Diagnosis Date  . Depression   . Dialysis patient ([place])   . ESRD (end stage renal disease) on dialysis ([place])   . Failure to thrive in adult   . Gout   . Guillain Barr syndrome ([place])   . Guillain-Barre ([place])   . HTN (hypertension)   . Kidney failure   . Pneumonia   . Renal insufficiency   . Respiratory failure ([place])     PAST SURGICAL HISTOIRY:   Past Surgical History:  Procedure Laterality Date  . A/V FISTULAGRAM Right 01/07/2018   Procedure: A/V FISTULAGRAM;  Surgeon: Algernon Huxley, MD;  Location: [place] [place] [facility];  Service: Cardiovascular;  Laterality: Right;  . AMPUTATION TOE Right 06/08/2017   Procedure: AMPUTATION TOE RIGHT FIFTH TOE;  Surgeon: Samara Deist, DPM;  Location: [facility] ORS;  Service: Podiatry;  Laterality: Right;  . AV FISTULA PLACEMENT     x5       2 graphs  . PARATHYROIDECTOMY    . RENAL BIOPSY    . tonsiilectomy    . tracheotomy      SOCIAL HISTORY:   Social History   Tobacco Use  . Smoking status: Never Smoker  . Smokeless tobacco: Never Used  Substance Use Topics  . Alcohol use: No    FAMILY HISTORY:   Family History  Problem Relation Age of Onset  . Diabetes Mellitus II Father   . Kidney disease Father   . Kidney failure Paternal Grandfather   . Prostate cancer Neg Hx   . Kidney cancer Neg Hx   . Bladder Cancer Neg Hx     DRUG ALLERGIES:   Allergies  Allergen Reactions  . Ivp Dye [Iodinated Diagnostic Agents] Other (See Comments)    Reaction unknown  . Ondansetron Other (See Comments)    Stomach pain   . Minoxidil Other (See Comments)    "put fluid around my heart", PERICARDIAL EFFUSION  . Morphine Other (See Comments)    Makes patient aggressive  . Morphine And Related Other (See Comments)    Reaction unknown  . Omnipaque [Iohexol] Itching and Other (See Comments)    Rigors on one occasion, widespread itching on a separate occasion (resolved with Benadryl), tremors    REVIEW OF SYSTEMS:  CONSTITUTIONAL: No fever, fatigue or weakness.  EYES: No blurred or double vision.  EARS, NOSE, AND THROAT: No tinnitus or ear pain.  RESPIRATORY: No cough, shortness of breath, wheezing or hemoptysis.  CARDIOVASCULAR: No chest pain, orthopnea, edema.  GASTROINTESTINAL: No nausea, vomiting, diarrhea or abdominal pain.  GENITOURINARY: No dysuria, hematuria.  ENDOCRINE: No polyuria, nocturia,  HEMATOLOGY: No anemia, easy bruising or bleeding SKIN: No rash or lesion.  Right permacath site looks clean without evidence of infection. MUSCULOSKELETAL: No joint pain or arthritis.   NEUROLOGIC: No tingling, numbness, weakness.  PSYCHIATRY: No anxiety or depression.   MEDICATIONS AT HOME:   Prior to Admission medications   Medication Sig Start Date End Date Taking? Authorizing Provider  calcium acetate (PHOSLO)  667 MG capsule Take 3 capsules (2,001 mg total) by mouth 3 (three) times daily with meals. Patient taking differently: See admin instructions. Take 3 capsules (2001MG ) by mouth 3 times daily with meals and 2 capsules (1334MG ) by mouth daily with snacks 08/26/14  Yes Wieting, Richard, MD  docusate sodium (COLACE) 100 MG capsule Take 200 mg by mouth daily.   Yes [provider]  gabapentin (NEURONTIN) 300 MG capsule Take 600 mg by mouth at bedtime.    Yes [provider]  HYDROcodone-acetaminophen (NORCO/VICODIN) 5-325 MG tablet Take 1 tablet by mouth 3 (three) times daily for 14 days. 01/08/18 01/22/18 Yes Molli Barrows, MD  midodrine (PROAMATINE) 5 MG tablet Take 1 tablet (5 mg total) by mouth 3 (three) times daily with meals. 11/28/14  Yes Theodoro Grist, MD  sildenafil (REVATIO) 20 MG tablet Take 20-100 mg by mouth as needed (for ED).  03/02/17  Yes [provider]      VITAL SIGNS:  Blood pressure (!) 75/37, pulse (!) 103, temperature 99.3 F (37.4 C), temperature source Oral, resp. rate 15, SpO2 (!) 76 %.  PHYSICAL EXAMINATION:  GENERAL:  42 y.o.-year-old patient lying in the bed with no acute distress.  EYES: Pupils equal, round, reactive to light and accommodation. No scleral icterus. Extraocular muscles intact.  HEENT: Head atraumatic, normocephalic. Oropharynx and nasopharynx clear.  NECK:  Supple, no jugular venous distention. No thyroid enlargement, no tenderness.  LUNGS: Normal breath sounds bilaterally, no wheezing, rales,rhonchi or crepitation. No use of accessory muscles of respiration.  CARDIOVASCULAR: S1, S2 normal. No murmurs, rubs, or gallops.  ABDOMEN: Soft, nontender, nondistended. Bowel sounds present. No organomegaly or mass.  EXTREMITIES: No pedal edema, cyanosis, or clubbing.  NEUROLOGIC: Cranial nerves II through XII are intact. Muscle strength 5/5 in all extremities. Sensation intact. Gait not checked.  PSYCHIATRIC: The patient is alert and  oriented x 3.  SKIN: Permacath site in the right  anterior chest looks clean without evidence of infection.  LABORATORY PANEL:   CBC Recent Labs  Lab 01/19/18 1227  WBC 11.7*  HGB 13.7  HCT 41.1  PLT 101*   ------------------------------------------------------------------------------------------------------------------  Chemistries  Recent Labs  Lab 01/19/18 1227  NA 137  K 5.1  CL 95*  CO2 28  GLUCOSE 121*  BUN 25*  CREATININE 8.26*  CALCIUM 10.8*  AST 28  ALT 27  ALKPHOS 61  BILITOT 1.0   ------------------------------------------------------------------------------------------------------------------  Cardiac Enzymes No results for input(s): TROPONINI in the last 168 hours. ------------------------------------------------------------------------------------------------------------------  RADIOLOGY:  Dg Chest Portable 1 View  Result Date: 01/19/2018 CLINICAL DATA:  Fever EXAM: PORTABLE CHEST 1 VIEW COMPARISON:  August 16, 2017 FINDINGS: Central catheter tip at cavoatrial junction. No pneumothorax. No edema or consolidation. Heart size and pulmonary vascularity are normal. No adenopathy. There is a stent in the left axillary  region. IMPRESSION: No edema or consolidation. Central catheter as described without pneumothorax. Stable cardiac silhouette. Electronically Signed   By: Lowella Grip III M.D.   On: 01/19/2018 14:29    EKG:   Orders placed or performed during the hospital encounter of 01/19/18  . EKG 12-Lead  . EKG 12-Lead  . ED EKG 12-Lead  . ED EKG 12-Lead    IMPRESSION AND PLAN:  42 year old male with ESRD on hemodialysis comes from dialysis with fever 100.4. 1.  Early sepsis, patient has elevated white count 11 without evidence of focus of infection.  And patient has no focal symptoms and appetite is good and asking for double portions of his food,.  Continue  antibiotics, continue for 24 hours until blood cultures levels are available: Unable to  give IV fluids as per sepsis protocol because of his ESRD on dialysis.  I spoke with Dr. Murlean Iba.  Patient has good appetite and able to drink so he will maintain his hydration.  Hold off on the IV fluids.  Chest x-ray, influenza swab is negative.  #2 ESRD on hemodialysis, nephrology consulted for his dialysis needs. #3 .chronic hypotension: Patient on midodrine 5 mg 3 times daily, reordered that. All the records are reviewed and case discussed with ED provider. Management plans discussed with the patient, family and they are in agreement.  CODE STATUS: full  TOTAL TIME TAKING CARE OF THIS PATIENT: 55 minutes.    Epifanio Lesches M.D on 01/19/2018 at 5:12 PM  Between 7am to 6pm - Pager - [phone]  After 6pm go to [email or web address] - password [email or web address] [facility]  Office  [phone]  CC: Primary care physician; Leonel Ramsay, MD  Note: This dictation was prepared with Dragon dictation along with smaller phrase technology. Any transcriptional errors that result from this process are unintentional.

## 2018-01-19 NOTE — ED Notes (Signed)
Pt's iv infiltrated after receiving 500cc fluid. Iv d/c'd and dr aware.

## 2018-01-19 NOTE — ED Notes (Signed)
Pt sitting up in bed and eating lunch. Has no complaints. bp is reading low and pt states "that's normal" - did not take his hypotension med before dialysis.

## 2018-01-19 NOTE — Progress Notes (Signed)
CODE SEPSIS - PHARMACY COMMUNICATION  **Broad Spectrum Antibiotics should be administered within 1 hour of Sepsis diagnosis**  Time Code Sepsis Called/Page Received: 11:56  Antibiotics Ordered: none  Time of 1st antibiotic administration: N/A  Additional action taken by pharmacy: 12:34 called ED to speak with Dr. Archie Balboa about abx for code sepsis but no one answered the phone. Will continue to try to reach him.  12:53 called again. An unidentified respondent answered the phone and transferred me to Dr. Archie Balboa but no answer at the desk. Will continue to try to reach him.  14:02 no antibiotics ordered. Influenza negative.  If necessary, Name of Provider/Nurse Contacted:     Laural Benes ,PharmD Clinical Pharmacist  01/19/2018  12:01 PM

## 2018-01-19 NOTE — ED Notes (Signed)
Report called to 2c

## 2018-01-19 NOTE — ED Provider Notes (Signed)
[facility] Emergency Department Provider Note   ____________________________________________   I have reviewed the triage vital signs and the nursing notes.   HISTORY  Chief Complaint Code Sepsis   History limited by: Not Limited   HPI Patrick Downs is a 42 y.o. male who presents to the emergency department today because of concerns for feeling unwell and chills.  Patient is coming from dialysis.  He states that he started feeling poorly today.  Had felt in his normal state of health yesterday.  However this morning he felt bad.  He stated he had his chills.  When the chills occur he does get generalized body aches.  Patient denies any cough.  He denies any nausea vomiting or diarrhea.   Per medical record review patient has a history of ESRD on dialysis.   Past Medical History:  Diagnosis Date  . Depression   . Dialysis patient ([place])   . ESRD (end stage renal disease) on dialysis ([place])   . Failure to thrive in adult   . Gout   . Guillain Barr syndrome ([place])   . Guillain-Barre ([place])   . HTN (hypertension)   . Kidney failure   . Pneumonia   . Renal insufficiency   . Respiratory failure [facility])     Patient Active Problem List   Diagnosis Date Noted  . HTN (hypertension) 12/28/2017  . Hyperkalemia 08/16/2017  . Rectal pain 11/27/2014  . Encounter for imaging study to confirm nasogastric (NG) tube placement   . Encounter for nasogastric (NG) tube placement   . Respiratory failure, acute ([place])   . Guillain Barr syndrome, lower ext weakness 08/27/2014  . CAP (community acquired pneumonia) 08/23/2014  . ESRD on dialysis ([place]) 08/23/2014  . A-V fistula ([place]) 08/23/2014  . ARDS (adult respiratory distress syndrome) ([place]) 08/23/2014  . Septic shock ([place]) 08/23/2014  . Hypotension 08/23/2014  . Acute respiratory failure ([place]) 08/23/2014  . Fever   . Sepsis ([place]) 08/22/2014    Past Surgical History:  Procedure Laterality Date  . A/V  FISTULAGRAM Right 01/07/2018   Procedure: A/V FISTULAGRAM;  Surgeon: Algernon Huxley, MD;  Location: [facility];  Service: Cardiovascular;  Laterality: Right;  . AMPUTATION TOE Right 06/08/2017   Procedure: AMPUTATION TOE RIGHT FIFTH TOE;  Surgeon: Samara Deist, DPM;  Location: [facility] ORS;  Service: Podiatry;  Laterality: Right;  . AV FISTULA PLACEMENT     x5      2 graphs  . PARATHYROIDECTOMY    . RENAL BIOPSY    . tonsiilectomy    . tracheotomy      Prior to Admission medications   Medication Sig Start Date End Date Taking? Authorizing Provider  calcium acetate (PHOSLO) 667 MG capsule Take 3 capsules (2,001 mg total) by mouth 3 (three) times daily with meals. 08/26/14   [name], MD  gabapentin (NEURONTIN) 300 MG capsule Take 600 mg by mouth at bedtime.     [provider]  gabapentin (NEURONTIN) 300 MG capsule Take by mouth. 09/18/17 09/18/18  [provider]  HYDROcodone-acetaminophen (NORCO/VICODIN) 5-325 MG tablet Take 1 tablet by mouth 3 (three) times daily for 14 days. 01/08/18 01/22/18  Molli Barrows, MD  midodrine (PROAMATINE) 5 MG tablet Take 1 tablet (5 mg total) by mouth 3 (three) times daily with meals. 11/28/14   Theodoro Grist, MD  oxyCODONE-acetaminophen (PERCOCET) 10-325 MG tablet Take 1 tablet by mouth every 4 (four) hours as needed for pain.  03/21/17  [provider]  sildenafil (REVATIO) 20 MG tablet Take 20-100 mg by mouth as needed (for ED).  03/02/17   [provider]    Allergies Ivp dye [iodinated diagnostic agents]; Ondansetron; Minoxidil; Morphine; Morphine and related; and Omnipaque [iohexol]  Family History  Problem Relation Age of Onset  . Diabetes Mellitus II Father   . Kidney disease Father   . Kidney failure Paternal Grandfather   . Prostate cancer Neg Hx   . Kidney cancer Neg Hx   . Bladder Cancer Neg Hx     Social History Social History   Tobacco Use  . Smoking status: Never Smoker  . Smokeless  tobacco: Never Used  Substance Use Topics  . Alcohol use: No  . Drug use: Yes    Types: Marijuana    Review of Systems Constitutional: Positive for chills.  Eyes: No visual changes. ENT: No sore throat. Cardiovascular: Denies chest pain. Respiratory: Denies shortness of breath. Gastrointestinal: No abdominal pain.  No nausea, no vomiting.  No diarrhea.   Genitourinary: Negative for dysuria. Musculoskeletal: Positive for body aches. Skin: Negative for rash. Neurological: Negative for headaches, focal weakness or numbness.  ____________________________________________   PHYSICAL EXAM:  VITAL SIGNS: ED Triage Vitals [01/19/18 1150]  Enc Vitals Group     BP (!) 81/42     Pulse Rate (!) 119     Resp 20     Temp 99.3 F (37.4 C)     Temp Source Oral     SpO2 100 %   Constitutional: Alert and oriented.  Eyes: Conjunctivae are normal.  ENT      Head: Normocephalic and atraumatic.      Nose: No congestion/rhinnorhea.      Mouth/Throat: Mucous membranes are moist.      Neck: No stridor. Hematological/Lymphatic/Immunilogical: No cervical lymphadenopathy. Cardiovascular: Tachycardic, regular rhythm.  No murmurs, rubs, or gallops. Respiratory: Normal respiratory effort without tachypnea nor retractions. Breath sounds are clear and equal bilaterally. No wheezes/rales/rhonchi. Gastrointestinal: Soft and non tender. No rebound. No guarding.  Genitourinary: Deferred Musculoskeletal: Normal range of motion in all extremities. Bilateral av fistula sites on upper arms Neurologic:  Normal speech and language. No gross focal neurologic deficits are appreciated.  Skin:  Skin is warm, dry and intact. No rash noted. Psychiatric: Mood and affect are normal. Speech and behavior are normal. Patient exhibits appropriate insight and judgment.  ____________________________________________    LABS (pertinent positives/negatives)  CMP na 137, k 5.1, glu 121, cr 8.26, ca 10.8 CBC wbc 11.7,  hgb 13.7, plt 101 Lactic 2.0 ____________________________________________   EKG  I, Nance Pear, attending physician, personally viewed and interpreted this EKG  EKG Time: 1146 Rate: 122 Rhythm: sinus tachycardia Axis: normal Intervals: qtc 396 QRS: narrow ST changes: no st elevation Impression: abnormal ekg   ____________________________________________    RADIOLOGY  CXR No edema, no pneumonia.   ____________________________________________   PROCEDURES  Procedures  Angiocath insertion Performed by: Nance Pear  Consent: Verbal consent obtained. Risks and benefits: risks, benefits and alternatives were discussed Time out: Immediately prior to procedure a "time out" was called to verify the correct patient, procedure, equipment, support staff and site/side marked as required.  Preparation: Patient was prepped and draped in the usual sterile fashion.  Vein Location: left ac  Ultrasound Guided  Gauge: 20  Normal blood return and flush without difficulty Patient tolerance: Patient tolerated the procedure well with no immediate complications.   CRITICAL CARE Performed by: Nance Pear   Total critical  care time: 35 minutes  Critical care time was exclusive of separately billable procedures and treating other patients.  Critical care was necessary to treat or prevent imminent or life-threatening deterioration.  Critical care was time spent personally by me on the following activities: development of treatment plan with patient and/or surrogate as well as nursing, discussions with consultants, evaluation of patient's response to treatment, examination of patient, obtaining history from patient or surrogate, ordering and performing treatments and interventions, ordering and review of laboratory studies, ordering and review of radiographic studies, pulse oximetry and re-evaluation of patient's  condition.  ____________________________________________   INITIAL IMPRESSION / ASSESSMENT AND PLAN / ED COURSE  Pertinent labs & imaging results that were available during my care of the patient were reviewed by me and considered in my medical decision making (see chart for details).   Presented to the emergency department today feeling unwell.  Patient's vital signs notable for significant hypotension as well as tachycardia.  Initial lactic was elevated at 2.  Did have some concerns for infection however no obvious source.  Patient was given broad-spectrum antibiotics and fluids.  Patient will be admitted to the hospital service.  ____________________________________________   FINAL CLINICAL IMPRESSION(S) / ED DIAGNOSES  Final diagnoses:  Hypotension, unspecified hypotension type  Body aches     Note: This dictation was prepared with Dragon dictation. Any transcriptional errors that result from this process are unintentional     Nance Pear, MD 01/20/18 1718

## 2018-01-20 LAB — BASIC METABOLIC PANEL
ANION GAP: 15 (ref 5–15)
BUN: 48 mg/dL — ABNORMAL HIGH (ref 6–20)
CALCIUM: 8.4 mg/dL — AB (ref 8.9–10.3)
CO2: 23 mmol/L (ref 22–32)
Chloride: 98 mmol/L (ref 98–111)
Creatinine, Ser: 11.39 mg/dL — ABNORMAL HIGH (ref 0.61–1.24)
GFR calc Af Amer: 6 mL/min — ABNORMAL LOW (ref >60)
GFR calc non Af Amer: 5 mL/min — ABNORMAL LOW (ref >60)
GLUCOSE: 114 mg/dL — AB (ref 70–99)
Potassium: 4.9 mmol/L (ref 3.5–5.1)
Sodium: 136 mmol/L (ref 135–145)

## 2018-01-20 LAB — CBC
HCT: 35.2 % — ABNORMAL LOW (ref 40.0–52.0)
HEMOGLOBIN: 11.9 g/dL — AB (ref 13.0–18.0)
MCH: 31.5 pg (ref 26.0–34.0)
MCHC: 33.7 g/dL (ref 32.0–36.0)
MCV: 93.3 fL (ref 80.0–100.0)
Platelets: 88 10*3/uL — ABNORMAL LOW (ref 150–440)
RBC: 3.77 MIL/uL — ABNORMAL LOW (ref 4.40–5.90)
RDW: 18.6 % — ABNORMAL HIGH (ref 11.5–14.5)
WBC: 8.8 10*3/uL (ref 3.8–10.6)

## 2018-01-20 LAB — GLUCOSE, CAPILLARY: Glucose-Capillary: 114 mg/dL — ABNORMAL HIGH (ref 70–99)

## 2018-01-20 MED ORDER — CALCIUM ACETATE (PHOS BINDER) 667 MG PO CAPS
2001.0000 mg | ORAL_CAPSULE | Freq: Three times a day (TID) | ORAL | Status: DC
  Administered 2018-01-20 – 2018-01-23 (×8): 2001 mg via ORAL
  Filled 2018-01-20 (×10): qty 3

## 2018-01-20 MED ORDER — VANCOMYCIN HCL IN DEXTROSE 1-5 GM/200ML-% IV SOLN
1000.0000 mg | Freq: Once | INTRAVENOUS | Status: AC
  Administered 2018-01-20: 1000 mg via INTRAVENOUS
  Filled 2018-01-20: qty 200

## 2018-01-20 MED ORDER — SODIUM CHLORIDE 0.9 % IV BOLUS
1000.0000 mL | Freq: Once | INTRAVENOUS | Status: AC
  Administered 2018-01-20: 1000 mL via INTRAVENOUS

## 2018-01-20 MED ORDER — MIDODRINE HCL 5 MG PO TABS
10.0000 mg | ORAL_TABLET | Freq: Three times a day (TID) | ORAL | Status: DC
  Administered 2018-01-20: 5 mg via ORAL
  Administered 2018-01-20 – 2018-01-23 (×8): 10 mg via ORAL
  Filled 2018-01-20 (×13): qty 2

## 2018-01-20 NOTE — Progress Notes (Signed)
[place] [facility] [place] NAME: Patrick Downs    MR#:  [id number]  DATE OF BIRTH:  1975-11-15  SUBJECTIVE:  CHIEF COMPLAINT:   Chief Complaint  Patient presents with  . Code Sepsis   Patient without complaint, case discussed with nephrology, patient states that his blood pressure is usually very low but he states that he is without any symptoms/lightheadedness/dizziness/fainting spells, case discussed with nursing staff-we will do manual blood pressure checks instead of using machine REVIEW OF SYSTEMS:  CONSTITUTIONAL: No fever, fatigue or weakness.  EYES: No blurred or double vision.  EARS, NOSE, AND THROAT: No tinnitus or ear pain.  RESPIRATORY: No cough, shortness of breath, wheezing or hemoptysis.  CARDIOVASCULAR: No chest pain, orthopnea, edema.  GASTROINTESTINAL: No nausea, vomiting, diarrhea or abdominal pain.  GENITOURINARY: No dysuria, hematuria.  ENDOCRINE: No polyuria, nocturia,  HEMATOLOGY: No anemia, easy bruising or bleeding SKIN: No rash or lesion. MUSCULOSKELETAL: No joint pain or arthritis.   NEUROLOGIC: No tingling, numbness, weakness.  PSYCHIATRY: No anxiety or depression.   ROS  DRUG ALLERGIES:   Allergies  Allergen Reactions  . Ivp Dye [Iodinated Diagnostic Agents] Other (See Comments)    Reaction unknown  . Ondansetron Other (See Comments)    Stomach pain   . Minoxidil Other (See Comments)    "put fluid around my heart", PERICARDIAL EFFUSION  . Morphine Other (See Comments)    Makes patient aggressive  . Morphine And Related Other (See Comments)    Reaction unknown  . Omnipaque [Iohexol] Itching and Other (See Comments)    Rigors on one occasion, widespread itching on a separate occasion (resolved with Benadryl), tremors    VITALS:  Blood pressure (!) 87/58, pulse 72, temperature 99 F (37.2 C), temperature source Oral, resp. rate 20, height 6\' 2"  (1.88 m), weight 120.8 kg, SpO2 98 %.  PHYSICAL EXAMINATION:   GENERAL:  42 y.o.-year-old patient lying in the bed with no acute distress.  EYES: Pupils equal, round, reactive to light and accommodation. No scleral icterus. Extraocular muscles intact.  HEENT: Head atraumatic, normocephalic. Oropharynx and nasopharynx clear.  NECK:  Supple, no jugular venous distention. No thyroid enlargement, no tenderness.  LUNGS: Normal breath sounds bilaterally, no wheezing, rales,rhonchi or crepitation. No use of accessory muscles of respiration.  CARDIOVASCULAR: S1, S2 normal. No murmurs, rubs, or gallops.  ABDOMEN: Soft, nontender, nondistended. Bowel sounds present. No organomegaly or mass.  EXTREMITIES: No pedal edema, cyanosis, or clubbing.  NEUROLOGIC: Cranial nerves II through XII are intact. Muscle strength 5/5 in all extremities. Sensation intact. Gait not checked.  PSYCHIATRIC: The patient is alert and oriented x 3.  SKIN: No obvious rash, lesion, or ulcer.   Physical Exam LABORATORY PANEL:   CBC Recent Labs  Lab 01/20/18 0446  WBC 8.8  HGB 11.9*  HCT 35.2*  PLT 88*   ------------------------------------------------------------------------------------------------------------------  Chemistries  Recent Labs  Lab 01/19/18 1227 01/20/18 0446  NA 137 136  K 5.1 4.9  CL 95* 98  CO2 28 23  GLUCOSE 121* 114*  BUN 25* 48*  CREATININE 8.26* 11.39*  CALCIUM 10.8* 8.4*  AST 28  --   ALT 27  --   ALKPHOS 61  --   BILITOT 1.0  --    ------------------------------------------------------------------------------------------------------------------  Cardiac Enzymes No results for input(s): TROPONINI in the last 168 hours. ------------------------------------------------------------------------------------------------------------------  RADIOLOGY:  Dg Chest Portable 1 View  Result Date: 01/19/2018 CLINICAL DATA:  Fever EXAM: PORTABLE CHEST 1 VIEW COMPARISON:  August 16, 2017 FINDINGS: Central catheter tip at cavoatrial junction. No  pneumothorax. No edema or consolidation. Heart size and pulmonary vascularity are normal. No adenopathy. There is a stent in the left axillary region. IMPRESSION: No edema or consolidation. Central catheter as described without pneumothorax. Stable cardiac silhouette. Electronically Signed   By: Lowella Grip III M.D.   On: 01/19/2018 14:29    ASSESSMENT AND PLAN:  42 year old male with ESRD on hemodialysis comes from dialysis with fever 100.4  *Acute sepsis Stable Continue sepsis protocol, empiric vancomycin/Zosyn, follow-up on cultures Note patient states that he has chronic severe hypotension but without symptomatology chronically  *Acute febrile illness Secondary to unknown etiology Chest x-ray, influenza swab is negative Follow-up on cultures  *Chronic ESRD on HD nephrology consulted for his dialysis needs Typically receives on Tuesdays/Thursdays/Saturdays  *chronic hypotension Increase midodrine to 10 mg 3 times daily  We will do manual blood pressure recordings for now   Disposition pending clinical course  All the records are reviewed and case discussed with ED provider. Management plans discussed with the patient, family and they are in agreement.    All the records are reviewed and case discussed with Care Management/Social Workerr. Management plans discussed with the patient, family and they are in agreement.  CODE STATUS: full  TOTAL TIME TAKING CARE OF THIS PATIENT: 35 minutes.     POSSIBLE D/C IN 1-3 DAYS, DEPENDING ON CLINICAL CONDITION.   Avel Peace Salary M.D on 01/20/2018   Between 7am to 6pm - Pager - [phone]  After 6pm go to [email or web address] - password [email or web address] [place] [facility]  Office  [phone]  CC: Primary care physician; Leonel Ramsay, MD  Note: This dictation was prepared with Dragon dictation along with smaller phrase technology. Any transcriptional errors that result from this process are unintentional.

## 2018-01-20 NOTE — Progress Notes (Signed)
[place], [place] 01/20/18  Subjective:   Patient known to our practice from outpatient dialysis.  He was sent over from dialysis unit for fever and hypotension.  Patient states he has chronic hypotension and takes Midrin before dialysis.   Still has low-grade temperature of 99.  Appetite is good.  Asking for double portions  Objective:  Vital signs in last 24 hours:  Temp:  [98 F (36.7 C)-99.3 F (37.4 C)] 99 F (37.2 C) (09/29 0457) Pulse Rate:  [72-119] 72 (09/29 0859) Resp:  [14-20] 20 (09/29 0457) BP: (71-112)/(28-77) 87/58 (09/29 0859) SpO2:  [76 %-100 %] 98 % (09/29 0457) Weight:  [120.8 kg] 120.8 kg (09/29 0457)  Weight change:  Filed Weights   01/19/18 2025 01/20/18 0457  Weight: 120.8 kg 120.8 kg    Intake/Output:    Intake/Output Summary (Last 24 hours) at 01/20/2018 1103 Last data filed at 01/20/2018 0954 Gross per 24 hour  Intake 631.63 ml  Output -  Net 631.63 ml     Physical Exam: General:  No acute distress, laying in the bed  HEENT  moist oral mucous membranes  Neck  supple  Pulm/lungs  normal breathing effort, clear to auscultation, room air  CVS/Heart  regular rhythm, no rub or gallop  Abdomen:   Soft, nontender  Extremities:  No peripheral edema  Neurologic:  Alert, oriented  Skin:  No acute rashes  Access:  Right IJ PermCath       Basic Metabolic Panel:  Recent Labs  Lab 01/19/18 1227 01/20/18 0446  NA 137 136  K 5.1 4.9  CL 95* 98  CO2 28 23  GLUCOSE 121* 114*  BUN 25* 48*  CREATININE 8.26* 11.39*  CALCIUM 10.8* 8.4*     CBC: Recent Labs  Lab 01/19/18 1227 01/20/18 0446  WBC 11.7* 8.8  NEUTROABS 10.0*  --   HGB 13.7 11.9*  HCT 41.1 35.2*  MCV 92.9 93.3  PLT 101* 88*      Lab Results  Component Value Date   HEPBSAG Negative 11/27/2014   HEPBSAB Reactive 09/28/2014   HEPBIGM NON REACTIVE 08/29/2014      Microbiology:  Recent Results (from the past 240 hour(s))  Blood  Culture (routine x 2)     Status: None (Preliminary result)   Collection Time: 01/19/18 12:27 PM  Result Value Ref Range Status   Specimen Description BLOOD LT [facility]  Final   Special Requests   Final    BOTTLES DRAWN AEROBIC AND ANAEROBIC Blood Culture results may not be optimal due to an inadequate volume of blood received in culture bottles   Culture   Final    NO GROWTH < 24 HOURS Performed at [facility], [address] [place], [place] [address]    Report Status PENDING  Incomplete  Blood Culture (routine x 2)     Status: None (Preliminary result)   Collection Time: 01/19/18  1:58 PM  Result Value Ref Range Status   Specimen Description BLOOD BLOOD LEFT HAND  Final   Special Requests   Final    BOTTLES DRAWN AEROBIC AND ANAEROBIC Blood Culture adequate volume   Culture   Final    NO GROWTH < 24 HOURS Performed at [facility], [address] [place], [place] [address]    Report Status PENDING  Incomplete  MRSA PCR Screening     Status: None   Collection Time: 01/19/18  8:13 PM  Result Value Ref Range Status  MRSA by PCR NEGATIVE NEGATIVE Final    Comment:        The GeneXpert MRSA Assay (FDA approved for NASAL specimens only), is one component of a comprehensive MRSA colonization surveillance program. It is not intended to diagnose MRSA infection nor to guide or monitor treatment for MRSA infections. Performed at [facility], [address] [place], [place] [address]     Coagulation Studies: No results for input(s): LABPROT, INR in the last 72 hours.  Urinalysis: No results for input(s): COLORURINE, LABSPEC, PHURINE, GLUCOSEU, HGBUR, BILIRUBINUR, KETONESUR, PROTEINUR, UROBILINOGEN, NITRITE, LEUKOCYTESUR in the last 72 hours.  Invalid input(s): APPERANCEUR    Imaging: Dg Chest Portable 1 View  Result Date: 01/19/2018 CLINICAL DATA:  Fever EXAM: PORTABLE CHEST 1 VIEW COMPARISON:  August 16, 2017 FINDINGS: Central catheter tip at  cavoatrial junction. No pneumothorax. No edema or consolidation. Heart size and pulmonary vascularity are normal. No adenopathy. There is a stent in the left axillary region. IMPRESSION: No edema or consolidation. Central catheter as described without pneumothorax. Stable cardiac silhouette. Electronically Signed   By: Lowella Grip III M.D.   On: 01/19/2018 14:29     Medications:   . piperacillin-tazobactam (ZOSYN)  IV 12.5 mL/hr at 01/20/18 0600  . sodium chloride 1,000 mL (01/20/18 0756)  . vancomycin    . [START ON 01/22/2018] vancomycin     . docusate sodium  100 mg Oral BID  . gabapentin  600 mg Oral QHS  . heparin  5,000 Units Subcutaneous Q8H  . midodrine  10 mg Oral TID WC   acetaminophen **OR** acetaminophen, bisacodyl, HYDROcodone-acetaminophen, ondansetron **OR** ondansetron (ZOFRAN) IV  Assessment/ Plan:  42 y.o. male with end-stage renal disease, hypertension, history of Gullian Barr syndrome   [address]/ [facility] Ne[facility]/ MWF/ 4.5 hrs.  1.  End-stage renal disease 2.  Anemia of chronic kidney disease 3.  Hypercalcemia 4.  Fever, low-grade ? Viral  5.  Chronic hypotension  Electrolytes and volume status are acceptable.  No acute indication for dialysis at present.  Patient has presumed diagnosis of sepsis because of fever and mildly elevated lactic acid and WBC count.  He is being treated empirically with broad-spectrum antibiotics.  Blood cultures have been drawn.  Results are pending, but neg so far.  Flu test is negative for influenza A and B.  Hemoglobin 11.9, above goal. Patient has normal p.o. Intake.  Hypercalcemia has resolved.  May resume binders. Continue midodrine for chronic hypotension.  Patient asymptomatic Next hemodialysis planned for Tuesday.    LOS: 1 Roger Kettles Candiss Norse 9/29/201911:03 AM  [place], [place]  Note: This note was prepared with Dragon dictation. Any transcription errors are unintentional

## 2018-01-21 ENCOUNTER — Inpatient Hospital Stay: Payer: Medicare Other

## 2018-01-21 ENCOUNTER — Other Ambulatory Visit: Payer: Medicare Other

## 2018-01-21 ENCOUNTER — Inpatient Hospital Stay: Admission: RE | Admit: 2018-01-21 | Payer: Medicare Other | Source: Ambulatory Visit

## 2018-01-21 ENCOUNTER — Inpatient Hospital Stay (HOSPITAL_COMMUNITY)
Admit: 2018-01-21 | Discharge: 2018-01-21 | Disposition: A | Discharge status: Home / Self Care | Payer: Medicare Other | Attending: Family Medicine | Admitting: Family Medicine

## 2018-01-21 DIAGNOSIS — I5030 Unspecified diastolic (congestive) heart failure: Secondary | ICD-10-CM

## 2018-01-21 DIAGNOSIS — I9589 Other hypotension: Secondary | ICD-10-CM

## 2018-01-21 DIAGNOSIS — I120 Hypertensive chronic kidney disease with stage 5 chronic kidney disease or end stage renal disease: Secondary | ICD-10-CM

## 2018-01-21 DIAGNOSIS — N186 End stage renal disease: Secondary | ICD-10-CM

## 2018-01-21 DIAGNOSIS — Z885 Allergy status to narcotic agent status: Secondary | ICD-10-CM

## 2018-01-21 DIAGNOSIS — Z841 Family history of disorders of kidney and ureter: Secondary | ICD-10-CM

## 2018-01-21 DIAGNOSIS — Z888 Allergy status to other drugs, medicaments and biological substances status: Secondary | ICD-10-CM

## 2018-01-21 DIAGNOSIS — Z992 Dependence on renal dialysis: Secondary | ICD-10-CM

## 2018-01-21 DIAGNOSIS — G629 Polyneuropathy, unspecified: Secondary | ICD-10-CM

## 2018-01-21 DIAGNOSIS — R509 Fever, unspecified: Secondary | ICD-10-CM

## 2018-01-21 DIAGNOSIS — Z8614 Personal history of Methicillin resistant Staphylococcus aureus infection: Secondary | ICD-10-CM

## 2018-01-21 DIAGNOSIS — Z91041 Radiographic dye allergy status: Secondary | ICD-10-CM

## 2018-01-21 DIAGNOSIS — Z89421 Acquired absence of other right toe(s): Secondary | ICD-10-CM

## 2018-01-21 DIAGNOSIS — R1013 Epigastric pain: Secondary | ICD-10-CM

## 2018-01-21 LAB — GLUCOSE, CAPILLARY: Glucose-Capillary: 93 mg/dL (ref 70–99)

## 2018-01-21 LAB — ECHOCARDIOGRAM COMPLETE
Height: 74 in
Weight: 4303.38 oz

## 2018-01-21 NOTE — Progress Notes (Signed)
*  PRELIMINARY RESULTS* Echocardiogram 2D Echocardiogram has been performed.  Patrick Downs 01/21/2018, 2:02 PM

## 2018-01-21 NOTE — Consult Note (Addendum)
NAME: Patrick Downs  DOB: 1975-09-01  MRN: [id number]  Date/Time: 01/21/2018 7:36 PM  salary Subjective:  REASON FOR CONSULT: endocarditis ? Patrick Downs is a 42 y.o. 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 Pt apparently had forgotten o take the midodrine on hat day I am asked to see him for the same and rule out endocarditis His blood culture has been negative. He underwent Right radiocephalic arteriovenous fistula cannulation under ultrasound guidance with  Right arm fistulagram on 01/07/18. He has not had any pain at the site He has many malfunctioning fistulas and now he has a rt sided tunneled catheter for dialysis He c/o epigastric abdominal pain which started in the hospital. Sharp pain, applying pressure to the region  helps. No radiation to the back.no relieving factors No nausea or vomiting. He does not have cough or sob  Past medical history End-stage renal disease For neuropathy Secondary hyperparathyroidism Hypertension Guillain barre Nonfunctioning AV fistulas Gout Right foot infection status post fifth toe amputation  Past Surgical History:  Procedure Laterality Date  . A/V FISTULAGRAM Right 01/07/2018   Procedure: A/V FISTULAGRAM;  Surgeon: Algernon Huxley, MD;  Location: [facility];  Service: Cardiovascular;  Laterality: Right;  . AMPUTATION TOE Right 06/08/2017   Procedure: AMPUTATION TOE RIGHT FIFTH TOE;  Surgeon: Samara Deist, DPM;  Location: [facility];  Service: Podiatry;  Laterality: Right;  . AV FISTULA PLACEMENT     x5      2 graphs  . PARATHYROIDECTOMY    . RENAL BIOPSY    . tonsiilectomy    . tracheotomy        Family History  Problem Relation Age of Onset  . Diabetes Mellitus II Father   . Kidney disease Father   . Kidney failure Paternal Grandfather   . Prostate cancer Neg Hx    . Kidney cancer Neg Hx   . Bladder Cancer Neg Hx    Allergies  Allergen Reactions  . Ivp Dye [Iodinated Diagnostic Agents] Other (See Comments)    Reaction unknown  . Ondansetron Other (See Comments)    Stomach pain   . Minoxidil Other (See Comments)    "put fluid around my heart", PERICARDIAL EFFUSION  . Morphine Other (See Comments)    Makes patient aggressive  . Morphine And Related Other (See Comments)    Reaction unknown  . Omnipaque [Iohexol] Itching and Other (See Comments)    Rigors on one occasion, widespread itching on a separate occasion (resolved with Benadryl), tremors  ? Current Facility-Administered Medications  Medication Dose Route Frequency Provider Last Rate Last Dose  . acetaminophen (TYLENOL) tablet 650 mg  650 mg Oral Q6H PRN Epifanio Lesches, MD       Or  . acetaminophen (TYLENOL) suppository 650 mg  650 mg Rectal Q6H PRN Epifanio Lesches, MD      . bisacodyl (DULCOLAX) EC tablet 5 mg  5 mg Oral Daily PRN Epifanio Lesches, MD      . calcium acetate (PHOSLO) capsule 2,001 mg  2,001 mg Oral TID WC Murlean Iba, MD   2,001 mg at 01/21/18 1402  . docusate sodium (COLACE) capsule 100 mg  100 mg Oral BID Epifanio Lesches, MD   100 mg at 01/21/18 0830  . gabapentin (NEURONTIN) capsule 600 mg  600 mg Oral QHS Vianne Bulls,  Snehalatha, MD   600 mg at 01/20/18 2058  . heparin injection 5,000 Units  5,000 Units Subcutaneous Q8H Epifanio Lesches, MD   5,000 Units at 01/21/18 1401  . HYDROcodone-acetaminophen (NORCO/VICODIN) 5-325 MG per tablet 1-2 tablet  1-2 tablet Oral Q4H PRN Epifanio Lesches, MD   2 tablet at 01/21/18 1450  . midodrine (PROAMATINE) tablet 10 mg  10 mg Oral TID WC Salary, Montell D, MD   10 mg at 01/21/18 1741  . ondansetron (ZOFRAN) tablet 4 mg  4 mg Oral Q6H PRN Epifanio Lesches, MD       Or  . ondansetron (ZOFRAN) injection 4 mg  4 mg Intravenous Q6H PRN Epifanio Lesches, MD      . piperacillin-tazobactam (ZOSYN) IVPB  3.375 g  3.375 g Intravenous Q12H Epifanio Lesches, MD 12.5 mL/hr at 01/21/18 1744 3.375 g at 01/21/18 1744  . [START ON 01/22/2018] vancomycin (VANCOCIN) IVPB 1000 mg/200 mL premix  1,000 mg Intravenous Q T,Th,Sa-HD Epifanio Lesches, MD        REVIEW OF SYSTEMS:  Const:  fever,  chills, negative weight loss Eyes: negative diplopia or visual changes, negative eye pain ENT: negative coryza, negative sore throat Resp: negative cough, hemoptysis, dyspnea Cards: negative for chest pain, palpitations, lower extremity edema GU: negative for frequency, dysuria and hematuria GI: has abdominal pain, no  diarrhea, bleeding, constipation Skin: negative for rash and pruritus Heme: negative for easy bruising and gum/nose bleeding MS: negative for myalgias, arthralgias, back pain and muscle weakness Neurolo:negative for headaches, dizziness, vertigo, memory problems  Psych: negative for feelings of anxiety, depression  Endocrine: no polydipsia Allergy/Immunology- allergic to IV dye, minoxidil  Objective:  VITALS:  BP (!) 107/54 (BP Location: Right Leg)   Pulse 75   Temp 97.9 F (36.6 C) (Oral)   Resp 16   Ht 6\' 2"  (1.88 m)   Wt 122 kg   SpO2 96%   BMI 34.53 kg/m  PHYSICAL EXAM:  General cooperative, in  distress, appears stated age.  Head: Normocephalic, without obvious abnormality, atraumatic. Eyes: Conjunctivae clear, anicteric sclerae. Pupils are equal ENT Nares normal. No drainage or sinus tenderness. Lips, mucosa, and tongue normal. No Thrush Neck: Supple, symmetrical, no adenopathy, thyroid: non tender no carotid bruit and no JVD. Back: No CVA tenderness. Lungs: Clear to auscultation bilaterally. No Wheezing or Rhonchi. No rales. catheter site on the chest wall clean Heart: s1s2 Abdomen: Soft, -tender over epigastrium ,not distended. Bowel sounds normal. No masses Extremities: rt 5th toe amputation site healthy atraumatic, no cyanosis. No edema. No clubbing Rt arm fistula  site no redness or tenderness Skin: No rashes or lesions. Or bruising Lymph: Cervical, supraclavicular normal. Neurologic: wasting of interossei muscles of the hands, fingers in flexion  Pertinent Labs CBC Latest Ref Rng & Units 01/20/2018 01/19/2018 08/17/2017  WBC 3.8 - 10.6 K/uL 8.8 11.7(H) 6.0  Hemoglobin 13.0 - 18.0 g/dL 11.9(L) 13.7 11.3(L)  Hematocrit 40.0 - 52.0 % 35.2(L) 41.1 34.2(L)  Platelets 150 - 440 K/uL 88(L) 101(L) 134(L)   CMP Latest Ref Rng & Units 01/20/2018 01/19/2018 08/17/2017  Glucose 70 - 99 mg/dL 114(H) 121(H) 136(H)  BUN 6 - 20 mg/dL 48(H) 25(H) 87(H)  Creatinine 0.61 - 1.24 mg/dL 11.39(H) 8.26(H) 15.12(H)  Sodium 135 - 145 mmol/L 136 137 139  Potassium 3.5 - 5.1 mmol/L 4.9 5.1 4.8  Chloride 98 - 111 mmol/L 98 95(L) 96(L)  CO2 22 - 32 mmol/L 23 28 25   Calcium 8.9 - 10.3 mg/dL 8.4(L) 10.8(H) 6.6(L)  Total Protein 6.5 - 8.1 g/dL - 9.0(H) -  Total Bilirubin 0.3 - 1.2 mg/dL - 1.0 -  Alkaline Phos 38 - 126 U/L - 61 -  AST 15 - 41 U/L - 28 -  ALT 0 - 44 U/L - 27 -  9/28 BC NG   IMAGING RESULTS: ?No edema or consolidation. Central catheter as described without pneumothorax. Stable cardiac silhouette.  Impression/Recommendation with a history of ESRD, HTN , treated MRSA infection of the rt foot, s/p 5 th toe amputation Feb 2019 was admitted on 9/28 from Dialysis with chills and fever. I am asked to see him for the same and rule out endocarditis His blood culture has been negative. He underwent Right radiocephalic arteriovenous fistula cannulation under ultrasound guidance with  Right arm fistulagram on 01/07/18. He has not had any pain at the site He has many malfunctioning fistulas and now he has a rt sided tunneled catheter for dialysis He c/o epigastric abdominal pain which started in the hospital. Sharp pain, applying pressure to the region  helps. No radiation to the back.no relieving factors  ?Fever and chills during dialysis , none since hospitalization,  no significant leucocytosis and negative blood cultures. He had stenting of the rt fistula on 01/07/18. No evidence of infection there. Has a tunneled catheter Currently there is no evidence for  endocarditis. He has not had endocarditis in the past. Has not had positive blood cultures. 2 d echo done-  On vanco and cefepime- check blood culture from catheter- if neg would stop both antibiotics  ESRD- on dialysis  Peripheral neuropathy  Abdominal pain-Xray abd okay H/o GB syndrome ? ___________________________________________________ Discussed with patient,and Dr.Salary and nephrologist. ID will sign off. call if needed

## 2018-01-21 NOTE — Progress Notes (Signed)
ID Pt seen  Full consult note to follow ESRD on dialysis thru catheter Admitted with chills and fever while on dilaysis Fistulogram last week Blood culture neg so far, no fever since hospitlaization or leucocytosis Will check to see whetehr culture was sent from dialysis unit On vanco and zosyn C/o epigastric pain since admission Send blood cultures from dialysis catheter tomorrow when getting dialysis

## 2018-01-21 NOTE — Progress Notes (Signed)
[place], [place] 01/21/18  Subjective:  atient admitted with Reiter's and fevers. Thus far blood cultures negative. Patient has had PermCath since January of this year earlier.    Objective:  Vital signs in last 24 hours:  Temp:  [97.7 F (36.5 C)-98.5 F (36.9 C)] 97.7 F (36.5 C) (09/30 0433) Pulse Rate:  [73-81] 75 (09/30 0433) Resp:  [14-16] 16 (09/30 0433) BP: (80-104)/(28-66) 82/43 (09/30 0433) SpO2:  [98 %-99 %] 99 % (09/30 0433) Weight:  [122 kg] 122 kg (09/30 0500)  Weight change: 1.2 kg Filed Weights   01/19/18 2025 01/20/18 0457 01/21/18 0500  Weight: 120.8 kg 120.8 kg 122 kg    Intake/Output:    Intake/Output Summary (Last 24 hours) at 01/21/2018 1130 Last data filed at 01/21/2018 0951 Gross per 24 hour  Intake 1858.92 ml  Output 0 ml  Net 1858.92 ml     Physical Exam: General:  No acute distress, laying in the bed  HEENT  moist oral mucous membranes  Neck  supple  Pulm/lungs  normal breathing effort, clear to auscultation  CVS/Heart  regular rhythm, no rub or gallop  Abdomen:   Soft, nontender  Extremities:  No peripheral edema  Neurologic:  Alert, oriented  Skin:  No acute rashes  Access:  Right IJ PermCath       Basic Metabolic Panel:  Recent Labs  Lab 01/19/18 1227 01/20/18 0446  NA 137 136  K 5.1 4.9  CL 95* 98  CO2 28 23  GLUCOSE 121* 114*  BUN 25* 48*  CREATININE 8.26* 11.39*  CALCIUM 10.8* 8.4*     CBC: Recent Labs  Lab 01/19/18 1227 01/20/18 0446  WBC 11.7* 8.8  NEUTROABS 10.0*  --   HGB 13.7 11.9*  HCT 41.1 35.2*  MCV 92.9 93.3  PLT 101* 88*      Lab Results  Component Value Date   HEPBSAG Negative 11/27/2014   HEPBSAB Reactive 09/28/2014   HEPBIGM NON REACTIVE 08/29/2014      Microbiology:  Recent Results (from the past 240 hour(s))  Blood Culture (routine x 2)     Status: None (Preliminary result)   Collection Time: 01/19/18 12:27 PM  Result Value Ref Range Status    Specimen Description BLOOD LT [facility]  Final   Special Requests   Final    BOTTLES DRAWN AEROBIC AND ANAEROBIC Blood Culture results may not be optimal due to an inadequate volume of blood received in culture bottles   Culture   Final    NO GROWTH < 24 HOURS Performed at [facility], [address] [place], [place] [address]    Report Status PENDING  Incomplete  Blood Culture (routine x 2)     Status: None (Preliminary result)   Collection Time: 01/19/18  1:58 PM  Result Value Ref Range Status   Specimen Description BLOOD BLOOD LEFT HAND  Final   Special Requests   Final    BOTTLES DRAWN AEROBIC AND ANAEROBIC Blood Culture adequate volume   Culture   Final    NO GROWTH < 24 HOURS Performed at [facility], [address] [place], [place] [address]    Report Status PENDING  Incomplete  MRSA PCR Screening     Status: None   Collection Time: 01/19/18  8:13 PM  Result Value Ref Range Status   MRSA by PCR NEGATIVE NEGATIVE Final    Comment:        The GeneXpert MRSA Assay (FDA approved  for NASAL specimens only), is one component of a comprehensive MRSA colonization surveillance program. It is not intended to diagnose MRSA infection nor to guide or monitor treatment for MRSA infections. Performed at [facility], [address] [place], [place] [address]     Coagulation Studies: No results for input(s): LABPROT, INR in the last 72 hours.  Urinalysis: No results for input(s): COLORURINE, LABSPEC, PHURINE, GLUCOSEU, HGBUR, BILIRUBINUR, KETONESUR, PROTEINUR, UROBILINOGEN, NITRITE, LEUKOCYTESUR in the last 72 hours.  Invalid input(s): APPERANCEUR    Imaging: Dg Chest Portable 1 View  Result Date: 01/19/2018 CLINICAL DATA:  Fever EXAM: PORTABLE CHEST 1 VIEW COMPARISON:  August 16, 2017 FINDINGS: Central catheter tip at cavoatrial junction. No pneumothorax. No edema or consolidation. Heart size and pulmonary vascularity are normal. No adenopathy.  There is a stent in the left axillary region. IMPRESSION: No edema or consolidation. Central catheter as described without pneumothorax. Stable cardiac silhouette. Electronically Signed   By: Lowella Grip III M.D.   On: 01/19/2018 14:29     Medications:   . piperacillin-tazobactam (ZOSYN)  IV 3.375 g (01/21/18 0550)  . [START ON 01/22/2018] vancomycin     . calcium acetate  2,001 mg Oral TID WC  . docusate sodium  100 mg Oral BID  . gabapentin  600 mg Oral QHS  . heparin  5,000 Units Subcutaneous Q8H  . midodrine  10 mg Oral TID WC   acetaminophen **OR** acetaminophen, bisacodyl, HYDROcodone-acetaminophen, ondansetron **OR** ondansetron (ZOFRAN) IV  Assessment/ Plan:  42 y.o. male with end-stage renal disease, hypertension, history of Gullian Barr syndrome   [address]/ [facility] Neph/TTHS/ 4.5 hrs.  1.  End-stage renal disease 2.  Anemia of chronic kidney disease 3.  Hypercalcemia 4.  Fever, low-grade ? Viral  5.  Chronic hypotension  Plan:  The patient had 2 episodes of chills during his most recent outpatient dialysis.  Highly suspicious for bacteremia though blood cultures thus far are negative.  Recommend 2-D echocardiogram to make sure there is no underlying endocarditis.  Continue broad-spectrum antibiotics as per hospitalist.  Also recommend infectious disease consultation.  Next dialysis treatment tomorrow.    LOS: 2 Jaikob Borgwardt 9/30/201911:30 AM  [place], [place]  Note: This note was prepared with Dragon dictation. Any transcription errors are unintentional

## 2018-01-21 NOTE — Progress Notes (Signed)
[place] [facility] [place] NAME: Patrick Downs    MR#:  [id number]  DATE OF BIRTH:  07-06-1975  SUBJECTIVE:  CHIEF COMPLAINT:   Chief Complaint  Patient presents with  . Code Sepsis   Case discussed with nephrology, will consult infectious disease given sepsis without etiology-concern for recurrent endocarditis, check echocardiogram REVIEW OF SYSTEMS:  CONSTITUTIONAL: No fever, fatigue or weakness.  EYES: No blurred or double vision.  EARS, NOSE, AND THROAT: No tinnitus or ear pain.  RESPIRATORY: No cough, shortness of breath, wheezing or hemoptysis.  CARDIOVASCULAR: No chest pain, orthopnea, edema.  GASTROINTESTINAL: No nausea, vomiting, diarrhea or abdominal pain.  GENITOURINARY: No dysuria, hematuria.  ENDOCRINE: No polyuria, nocturia,  HEMATOLOGY: No anemia, easy bruising or bleeding SKIN: No rash or lesion. MUSCULOSKELETAL: No joint pain or arthritis.   NEUROLOGIC: No tingling, numbness, weakness.  PSYCHIATRY: No anxiety or depression.   ROS  DRUG ALLERGIES:   Allergies  Allergen Reactions  . Ivp Dye [Iodinated Diagnostic Agents] Other (See Comments)    Reaction unknown  . Ondansetron Other (See Comments)    Stomach pain   . Minoxidil Other (See Comments)    "put fluid around my heart", PERICARDIAL EFFUSION  . Morphine Other (See Comments)    Makes patient aggressive  . Morphine And Related Other (See Comments)    Reaction unknown  . Omnipaque [Iohexol] Itching and Other (See Comments)    Rigors on one occasion, widespread itching on a separate occasion (resolved with Benadryl), tremors    VITALS:  Blood pressure (!) 82/43, pulse 75, temperature 97.7 F (36.5 C), temperature source Oral, resp. rate 16, height 6\' 2"  (1.88 m), weight 122 kg, SpO2 99 %.  PHYSICAL EXAMINATION:  GENERAL:  42 y.o.-year-old patient lying in the bed with no acute distress.  EYES: Pupils equal, round, reactive to light and accommodation. No scleral  icterus. Extraocular muscles intact.  HEENT: Head atraumatic, normocephalic. Oropharynx and nasopharynx clear.  NECK:  Supple, no jugular venous distention. No thyroid enlargement, no tenderness.  LUNGS: Normal breath sounds bilaterally, no wheezing, rales,rhonchi or crepitation. No use of accessory muscles of respiration.  CARDIOVASCULAR: S1, S2 normal. No murmurs, rubs, or gallops.  ABDOMEN: Soft, nontender, nondistended. Bowel sounds present. No organomegaly or mass.  EXTREMITIES: No pedal edema, cyanosis, or clubbing.  NEUROLOGIC: Cranial nerves II through XII are intact. Muscle strength 5/5 in all extremities. Sensation intact. Gait not checked.  PSYCHIATRIC: The patient is alert and oriented x 3.  SKIN: No obvious rash, lesion, or ulcer.   Physical Exam LABORATORY PANEL:   CBC Recent Labs  Lab 01/20/18 0446  WBC 8.8  HGB 11.9*  HCT 35.2*  PLT 88*   ------------------------------------------------------------------------------------------------------------------  Chemistries  Recent Labs  Lab 01/19/18 1227 01/20/18 0446  NA 137 136  K 5.1 4.9  CL 95* 98  CO2 28 23  GLUCOSE 121* 114*  BUN 25* 48*  CREATININE 8.26* 11.39*  CALCIUM 10.8* 8.4*  AST 28  --   ALT 27  --   ALKPHOS 61  --   BILITOT 1.0  --    ------------------------------------------------------------------------------------------------------------------  Cardiac Enzymes No results for input(s): TROPONINI in the last 168 hours. ------------------------------------------------------------------------------------------------------------------  RADIOLOGY:  Dg Chest Portable 1 View  Result Date: 01/19/2018 CLINICAL DATA:  Fever EXAM: PORTABLE CHEST 1 VIEW COMPARISON:  August 16, 2017 FINDINGS: Central catheter tip at cavoatrial junction. No pneumothorax. No edema or consolidation. Heart size and pulmonary vascularity are normal. No adenopathy.  There is a stent in the left axillary region. IMPRESSION: No  edema or consolidation. Central catheter as described without pneumothorax. Stable cardiac silhouette. Electronically Signed   By: Lowella Grip III M.D.   On: 01/19/2018 14:29    ASSESSMENT AND PLAN:  42 year old male with ESRD on hemodialysis comes from dialysis with fever 100.4  *Acute sepsis Stable  Cannot rule out possible recurrent endocarditis  Continue sepsis protocol, empiric vancomycin/Zosyn, follow-up on cultures-negative to date, consult infectious disease for expert opinion, check echocardiogram-if normal will proceed with TEE, and continue close medical monitoring Note patient states that he has chronic severe hypotension but without symptomatology chronically  *Acute febrile illness Resolved Secondary to unknown etiology Chest x-ray, influenza swab is negative Follow-up on cultures-negative thus far  *Chronic ESRD on HD Nephrology input appreciated-following for dialysis needs Typically receives on Tuesdays/Thursdays/Saturdays  *chronic hypotension Stable Continue increased midodrine to 10 mg 3 times daily  We will do manual blood pressure recordings for now   Disposition pending clinical course  All the records are reviewed and case discussed with ED provider. Management plans discussed with the patient, family and they are in agreement.    All the records are reviewed and case discussed with Care Management/Social Workerr. Management plans discussed with the patient, family and they are in agreement.  CODE STATUS: full  TOTAL TIME TAKING CARE OF THIS PATIENT: 35 minutes.     POSSIBLE D/C IN 1-3 DAYS, DEPENDING ON CLINICAL CONDITION.   Avel Peace [name] M.D on 01/21/2018   Between 7am to 6pm - Pager - [phone]  After 6pm go to [email or web address] - password [email or web address] [facility]  Office  [phone]  CC: Primary care physician; Leonel Ramsay, MD  Note: This dictation was prepared with Dragon dictation along with smaller  phrase technology. Any transcriptional errors that result from this process are unintentional.

## 2018-01-22 ENCOUNTER — Inpatient Hospital Stay
Admit: 2018-01-22 | Discharge: 2018-01-22 | Disposition: A | Discharge status: Home / Self Care | Payer: Medicare Other | Attending: Cardiovascular Disease | Admitting: Cardiovascular Disease

## 2018-01-22 ENCOUNTER — Encounter
Admission: EM | Disposition: A | Discharge status: Home / Self Care | Payer: Self-pay | Source: Home / Self Care | Attending: Family Medicine

## 2018-01-22 HISTORY — PX: TEE WITHOUT CARDIOVERSION: SHX5443

## 2018-01-22 LAB — GLUCOSE, CAPILLARY: Glucose-Capillary: 86 mg/dL (ref 70–99)

## 2018-01-22 SURGERY — ECHOCARDIOGRAM, TRANSESOPHAGEAL
Anesthesia: Moderate Sedation

## 2018-01-22 SURGERY — ECHOCARDIOGRAM, TRANSESOPHAGEAL
Anesthesia: Moderate Sedation | Laterality: Right

## 2018-01-22 MED ORDER — SODIUM CHLORIDE 0.9 % IV SOLN: INTRAVENOUS | Status: DC

## 2018-01-22 MED ORDER — MIDAZOLAM HCL 2 MG/2ML IJ SOLN
INTRAMUSCULAR | Status: AC | PRN
  Administered 2018-01-22: 2 mg via INTRAVENOUS
  Administered 2018-01-22: 1 mg via INTRAVENOUS

## 2018-01-22 MED ORDER — SODIUM CHLORIDE FLUSH 0.9 % IV SOLN
INTRAVENOUS | Status: AC
  Filled 2018-01-22: qty 10

## 2018-01-22 MED ORDER — MORPHINE SULFATE (PF) 2 MG/ML IV SOLN
1.0000 mg | INTRAVENOUS | Status: DC | PRN
  Administered 2018-01-22 (×2): 1 mg via INTRAVENOUS
  Filled 2018-01-22 (×2): qty 1

## 2018-01-22 MED ORDER — FENTANYL CITRATE (PF) 100 MCG/2ML IJ SOLN
INTRAMUSCULAR | Status: AC | PRN
  Administered 2018-01-22 (×2): 25 ug via INTRAVENOUS
  Administered 2018-01-22: 50 ug via INTRAVENOUS

## 2018-01-22 MED ORDER — FENTANYL CITRATE (PF) 100 MCG/2ML IJ SOLN
INTRAMUSCULAR | Status: AC
  Filled 2018-01-22: qty 2

## 2018-01-22 MED ORDER — MIDAZOLAM HCL 5 MG/5ML IJ SOLN
INTRAMUSCULAR | Status: AC
  Filled 2018-01-22: qty 5

## 2018-01-22 MED ORDER — BUTAMBEN-TETRACAINE-BENZOCAINE 2-2-14 % EX AERO
INHALATION_SPRAY | CUTANEOUS | Status: AC
  Filled 2018-01-22: qty 5

## 2018-01-22 MED ORDER — LIDOCAINE VISCOUS HCL 2 % MT SOLN
OROMUCOSAL | Status: AC
  Filled 2018-01-22: qty 15

## 2018-01-22 NOTE — Consult Note (Signed)
Patrick Downs is a 42 y.o. male  [id number]  Primary Cardiologist: Neoma Laming Reason for Consultation: Rule out endocarditis  HPI: This is a 42 year old African-American male who presented to the hospital with fever and chills and was found to be having sepsis.  I was asked to evaluate the patient for possible endocarditis.   Review of Systems: He denies any chest pain or shortness of breath   Past Medical History:  Diagnosis Date  . Depression   . Dialysis patient ([place])   . ESRD (end stage renal disease) on dialysis ([place])   . Failure to thrive in adult   . Gout   . Guillain Barr syndrome ([place])   . Guillain-Barre ([place])   . HTN (hypertension)   . Kidney failure   . Pneumonia   . Renal insufficiency   . Respiratory failure ([place])     Medications Prior to Admission  Medication Sig Dispense Refill  . calcium acetate (PHOSLO) 667 MG capsule Take 3 capsules (2,001 mg total) by mouth 3 (three) times daily with meals. (Patient taking differently: See admin instructions. Take 3 capsules (2001MG ) by mouth 3 times daily with meals and 2 capsules (1334MG ) by mouth daily with snacks) 1 capsule 0  . docusate sodium (COLACE) 100 MG capsule Take 200 mg by mouth daily.    Marland Kitchen gabapentin (NEURONTIN) 300 MG capsule Take 600 mg by mouth at bedtime.     Marland Kitchen HYDROcodone-acetaminophen (NORCO/VICODIN) 5-325 MG tablet Take 1 tablet by mouth 3 (three) times daily for 14 days. 42 tablet 0  . midodrine (PROAMATINE) 5 MG tablet Take 1 tablet (5 mg total) by mouth 3 (three) times daily with meals. 90 tablet 2  . sildenafil (REVATIO) 20 MG tablet Take 20-100 mg by mouth as needed (for ED).        Marland Kitchen butamben-tetracaine-benzocaine      . calcium acetate  2,001 mg Oral TID WC  . docusate sodium  100 mg Oral BID  . fentaNYL      . gabapentin  600 mg Oral QHS  . heparin  5,000 Units Subcutaneous Q8H  . lidocaine      . midazolam      . midodrine  10 mg Oral TID WC  . sodium chloride flush         Infusions: . piperacillin-tazobactam (ZOSYN)  IV 3.375 g (01/22/18 0551)  . vancomycin      Allergies  Allergen Reactions  . Ivp Dye [Iodinated Diagnostic Agents] Other (See Comments)    Reaction unknown  . Ondansetron Other (See Comments)    Stomach pain   . Minoxidil Other (See Comments)    "put fluid around my heart", PERICARDIAL EFFUSION  . Morphine Other (See Comments)    Makes patient aggressive  . Morphine And Related Other (See Comments)    Reaction unknown  . Omnipaque [Iohexol] Itching and Other (See Comments)    Rigors on one occasion, widespread itching on a separate occasion (resolved with Benadryl), tremors    Social History   Socioeconomic History  . Marital status: Divorced    Spouse name: Not on file  . Number of children: Not on file  . Years of education: Not on file  . Highest education level: Not on file  Occupational History  . Not on file  Social Needs  . Financial resource strain: Not on file  . Food insecurity:    Worry: Not on file    Inability: Not on file  .  Transportation needs:    Medical: Not on file    Non-medical: Not on file  Tobacco Use  . Smoking status: Never Smoker  . Smokeless tobacco: Never Used  Substance and Sexual Activity  . Alcohol use: No  . Drug use: Yes    Types: Marijuana  . Sexual activity: Not on file    Comment: did not ask, mother present  Lifestyle  . Physical activity:    Days per week: Not on file    Minutes per session: Not on file  . Stress: Not on file  Relationships  . Social connections:    Talks on phone: Not on file    Gets together: Not on file    Attends religious service: Not on file    Active member of club or organization: Not on file    Attends meetings of clubs or organizations: Not on file    Relationship status: Not on file  . Intimate partner violence:    Fear of current or ex partner: Not on file    Emotionally abused: Not on file    Physically abused: Not on file    Forced  sexual activity: Not on file  Other Topics Concern  . Not on file  Social History Narrative  . Not on file    Family History  Problem Relation Age of Onset  . Diabetes Mellitus II Father   . Kidney disease Father   . Kidney failure Paternal Grandfather   . Prostate cancer Neg Hx   . Kidney cancer Neg Hx   . Bladder Cancer Neg Hx     PHYSICAL EXAM: Vitals:   01/22/18 1301 01/22/18 1322  BP: (!) 84/51 (!) 95/52  Pulse: 75 76  Resp: 13 18  Temp:  (!) 97.5 F (36.4 C)  SpO2: 93% 100%     Intake/Output Summary (Last 24 hours) at 01/22/2018 1359 Last data filed at 01/22/2018 0431 Gross per 24 hour  Intake 480 ml  Output 0 ml  Net 480 ml    General:  Well appearing. No respiratory difficulty HEENT: normal Neck: supple. no JVD. Carotids 2+ bilat; no bruits. No lymphadenopathy or thryomegaly appreciated. Cor: PMI nondisplaced. Regular rate & rhythm. No rubs, gallops or murmurs. Lungs: clear Abdomen: soft, nontender, nondistended. No hepatosplenomegaly. No bruits or masses. Good bowel sounds. Extremities: no cyanosis, clubbing, rash, edema Neuro: alert & oriented x 3, cranial nerves grossly intact. moves all 4 extremities w/o difficulty. Affect pleasant.  ECG: Normal sinus rhythm no acute changes  Results for orders placed or performed during the hospital encounter of 01/19/18 (from the past 24 hour(s))  Glucose, capillary     Status: None   Collection Time: 01/22/18  7:44 AM  Result Value Ref Range   Glucose-Capillary 86 70 - 99 mg/dL   Comment 1 Notify RN    Dg Abd 1 View  Result Date: 01/21/2018 CLINICAL DATA:  Intermittent upper abdominal pain EXAM: ABDOMEN - 1 VIEW COMPARISON:  09/01/2014 FINDINGS: Nonobstructed bowel-gas pattern. No radiopaque calculi are visualized. IMPRESSION: Negative. Electronically Signed   By: Donavan Foil M.D.   On: 01/21/2018 19:28     ASSESSMENT AND PLAN: We will rule out endocarditis on the patient with sepsis and renal failure on  dialysis.  Transesophageal echocardiogram was done without complication this showed no evidence of vegetation on mitral tricuspid pulmonic or aortic valve.  Left ventricular ejection fraction was normal.  Please review the transesophageal echocardiographic report separately.  KHAN,SHAUKAT A

## 2018-01-22 NOTE — Progress Notes (Signed)
Post HD assessment. MD at the bedside. Pt experienced non-symptomatic hypotension throughout the entire HD tx, MD aware. Unable to take off much fluid, goal not met, Net UF 537, MD aware.    01/22/18 1817  Vital Signs  Temp (!) 96 F (35.6 C)  Temp Source Axillary  Pulse Rate 77  Pulse Rate Source Monitor  Resp 12  BP (!) 93/59  BP Location Left Arm  BP Method Automatic  Patient Position (if appropriate) Lying  Oxygen Therapy  SpO2 99 %  O2 Device Room Air  Dialysis Weight  Weight 122.5 kg  Type of Weight Post-Dialysis  Post-Hemodialysis Assessment  Rinseback Volume (mL) 250 mL  KECN 72.1 V  Dialyzer Clearance Lightly streaked  Duration of HD Treatment -hour(s) 3.5 hour(s)  Hemodialysis Intake (mL) 700 mL  UF Total -Machine (mL) 1237 mL  Net UF (mL) 537 mL  Tolerated HD Treatment Yes  Education / Care Plan  Dialysis Education Provided Yes  Documented Education in Care Plan Yes  Hemodialysis Catheter Right Subclavian  Placement Date/Time: 08/15/17 0100   Placed prior to admission: Yes  Orientation: Right  Access Location: Subclavian  Site Condition No complications  Blue Lumen Status Heparin locked  Red Lumen Status Heparin locked  Purple Lumen Status N/A  Catheter fill solution Heparin 1000 units/ml  Catheter fill volume (Arterial) 1.6 cc  Catheter fill volume (Venous) 1.8  Dressing Type Biopatch  Dressing Status Clean;Dry;Intact  Interventions New dressing  Drainage Description None  Dressing Change Due 01/29/18  Post treatment catheter status Capped and Clamped

## 2018-01-22 NOTE — Progress Notes (Signed)
Family Meeting Note  Advance Directive:yes  Today a meeting took place with the Patient.  Patient is able to participate   The following clinical team members were present during this meeting:MD  The following were discussed:Patient's diagnosis: End-stage renal disease, sepsis, Patient's progosis: Unable to determine and Goals for treatment: Full Code  Additional follow-up to be provided: prn  Time spent during discussion:20 minutes  Gorden Harms, MD

## 2018-01-22 NOTE — Progress Notes (Signed)
*  PRELIMINARY RESULTS* Echocardiogram Echocardiogram Transesophageal has been performed.  Sherrie Sport 01/22/2018, 1:05 PM

## 2018-01-22 NOTE — Care Management Important Message (Signed)
Initial Medicare IM signed. Copy left in patient's room for reference.

## 2018-01-22 NOTE — Progress Notes (Signed)
Post HD assessment    01/22/18 1815  Neurological  Level of Consciousness Alert  Orientation Level Oriented X4  Respiratory  Respiratory Pattern Regular;Unlabored  Chest Assessment Chest expansion symmetrical  Cardiac  Pulse Regular  Cardiac Rhythm NSR  Vascular  R Radial Pulse +2  L Radial Pulse +2  Edema Generalized  Integumentary  Integumentary (WDL) X  Skin Color Appropriate for ethnicity  Musculoskeletal  Musculoskeletal (WDL) X  Generalized Weakness Yes  Assistive Device None  GU Assessment  Genitourinary (WDL) X  Genitourinary Symptoms  (HD)  Psychosocial  Psychosocial (WDL) WDL

## 2018-01-22 NOTE — Progress Notes (Signed)
[facility] [place] NAME: Patrick Downs    MR#:  [id number]  DATE OF BIRTH:  04/17/1976  SUBJECTIVE:  CHIEF COMPLAINT:   Chief Complaint  Patient presents with  . Code Sepsis   Case discussed with nephrology, infectious disease input appreciated, cardiology to see, for TEE later today to evaluate for possible endocarditis REVIEW OF SYSTEMS:  CONSTITUTIONAL: No fever, fatigue or weakness.  EYES: No blurred or double vision.  EARS, NOSE, AND THROAT: No tinnitus or ear pain.  RESPIRATORY: No cough, shortness of breath, wheezing or hemoptysis.  CARDIOVASCULAR: No chest pain, orthopnea, edema.  GASTROINTESTINAL: No nausea, vomiting, diarrhea or abdominal pain.  GENITOURINARY: No dysuria, hematuria.  ENDOCRINE: No polyuria, nocturia,  HEMATOLOGY: No anemia, easy bruising or bleeding SKIN: No rash or lesion. MUSCULOSKELETAL: No joint pain or arthritis.   NEUROLOGIC: No tingling, numbness, weakness.  PSYCHIATRY: No anxiety or depression.   ROS  DRUG ALLERGIES:   Allergies  Allergen Reactions  . Ivp Dye [Iodinated Diagnostic Agents] Other (See Comments)    Reaction unknown  . Ondansetron Other (See Comments)    Stomach pain   . Minoxidil Other (See Comments)    "put fluid around my heart", PERICARDIAL EFFUSION  . Morphine Other (See Comments)    Makes patient aggressive  . Morphine And Related Other (See Comments)    Reaction unknown  . Omnipaque [Iohexol] Itching and Other (See Comments)    Rigors on one occasion, widespread itching on a separate occasion (resolved with Benadryl), tremors    VITALS:  Blood pressure 99/60, pulse 75, temperature 98.4 F (36.9 C), temperature source Oral, resp. rate 16, height 6\' 2"  (1.88 m), weight 121.2 kg, SpO2 95 %.  PHYSICAL EXAMINATION:  GENERAL:  42 y.o.-year-old patient lying in the bed with no acute distress.  EYES: Pupils equal, round, reactive to light and accommodation. No scleral  icterus. Extraocular muscles intact.  HEENT: Head atraumatic, normocephalic. Oropharynx and nasopharynx clear.  NECK:  Supple, no jugular venous distention. No thyroid enlargement, no tenderness.  LUNGS: Normal breath sounds bilaterally, no wheezing, rales,rhonchi or crepitation. No use of accessory muscles of respiration.  CARDIOVASCULAR: S1, S2 normal. No murmurs, rubs, or gallops.  ABDOMEN: Soft, nontender, nondistended. Bowel sounds present. No organomegaly or mass.  EXTREMITIES: No pedal edema, cyanosis, or clubbing.  NEUROLOGIC: Cranial nerves II through XII are intact. Muscle strength 5/5 in all extremities. Sensation intact. Gait not checked.  PSYCHIATRIC: The patient is alert and oriented x 3.  SKIN: No obvious rash, lesion, or ulcer.   Physical Exam LABORATORY PANEL:   CBC Recent Labs  Lab 01/20/18 0446  WBC 8.8  HGB 11.9*  HCT 35.2*  PLT 88*   ------------------------------------------------------------------------------------------------------------------  Chemistries  Recent Labs  Lab 01/19/18 1227 01/20/18 0446  NA 137 136  K 5.1 4.9  CL 95* 98  CO2 28 23  GLUCOSE 121* 114*  BUN 25* 48*  CREATININE 8.26* 11.39*  CALCIUM 10.8* 8.4*  AST 28  --   ALT 27  --   ALKPHOS 61  --   BILITOT 1.0  --    ------------------------------------------------------------------------------------------------------------------  Cardiac Enzymes No results for input(s): TROPONINI in the last 168 hours. ------------------------------------------------------------------------------------------------------------------  RADIOLOGY:  Dg Abd 1 View  Result Date: 01/21/2018 CLINICAL DATA:  Intermittent upper abdominal pain EXAM: ABDOMEN - 1 VIEW COMPARISON:  09/01/2014 FINDINGS: Nonobstructed bowel-gas pattern. No radiopaque calculi are visualized. IMPRESSION: Negative. Electronically Signed   By: Madie Reno.D.  On: 01/21/2018 19:28    ASSESSMENT AND PLAN:  42 year old  male with ESRD on hemodialysis comes from dialysis with fever 100.4  *Acute sepsis Stable  Cannot rule out possible recurrent endocarditis  Continue sepsis protocol, empiric vancomycin/Zosyn, follow-up on cultures-negative to date, ID input appreciated, echocardiogram noted for stage I diastolic dysfunction, check TEE, cardiology to see, for blood cultures with hemodialysis later today, follow-up on all other outstanding cultures  Note patient states that he has chronic severe hypotension but without symptomatology chronically  *Acute febrile illness Resolved Secondary to unknown etiology Chest x-ray, influenza swab is negative Follow-up on cultures-negative thus far  *Chronic ESRD on HD Nephrology input appreciated-following for dialysis needs Typically receives on Tuesdays/Thursdays/Saturdays  *chronic hypotension Improved Continue increased midodrine to 10 mg 3 times daily   Disposition pending clinical course  All the records are reviewed and case discussed with ED provider. Management plans discussed with the patient, family and they are in agreement.    All the records are reviewed and case discussed with Care Management/Social Workerr. Management plans discussed with the patient, family and they are in agreement.  CODE STATUS: full  TOTAL TIME TAKING CARE OF THIS PATIENT: 35 minutes.     POSSIBLE D/C IN 1-3 DAYS, DEPENDING ON CLINICAL CONDITION.   Avel Peace Salary M.D on 01/22/2018   Between 7am to 6pm - Pager - [phone]  After 6pm go to [email or web address] - password [email or web address]  Office  [phone]  CC: Primary care physician; Leonel Ramsay, MD  Note: This dictation was prepared with Dragon dictation along with smaller phrase technology. Any transcriptional errors that result from this process are unintentional.

## 2018-01-22 NOTE — Progress Notes (Signed)
Pre HD assessment   01/22/18 1415  Vital Signs  Temp 98.4 F (36.9 C)  Temp Source Oral  Pulse Rate 68  Pulse Rate Source Monitor  Resp 15  BP (!) 97/29  BP Location Left Leg  BP Method Automatic  Patient Position (if appropriate) Lying  Oxygen Therapy  SpO2 96 %  O2 Device Room Air  Pain Assessment  Pain Scale 0-10  Pain Score 8  Pain Type Chronic pain  Pain Location Back  Pain Orientation Lower  Pain Frequency Constant  Pain Intervention(s) RN made aware  Dialysis Weight  Weight 124.2 kg  Type of Weight Pre-Dialysis  Time-Out for Hemodialysis  What Procedure? HD  Pt Identifiers(min of two) First/Last Name;MRN/Account#  Correct Site? Yes  Correct Side? Yes  Correct Procedure? Yes  Consents Verified? Yes  Rad Studies Available? N/A  Safety Precautions Reviewed? Yes  Engineer, civil (consulting) Number  (2A)  Station Number 1  UF/Alarm Test Passed  Conductivity: Meter 13.6  Conductivity: Machine  13.9  pH 7.4  Reverse Osmosis main  Normal Saline Lot Number [id number]  Dialyzer Lot Number [id number]  Disposable Set Lot Number [id number]  Machine Temperature 98.6 F (37 C)  Musician and Audible Yes  Blood Lines Intact and Secured Yes  Pre Treatment Patient Checks  Vascular access used during treatment Catheter  Hepatitis B Surface Antigen Results Negative  Date Hepatitis B Surface Antigen Drawn 12/27/17  Hepatitis B Surface Antibody  (>10)  Date Hepatitis B Surface Antibody Drawn 12/27/17  Hemodialysis Consent Verified Yes  Hemodialysis Standing Orders Initiated Yes  ECG (Telemetry) Monitor On Yes  Prime Ordered Normal Saline  Length of  DialysisTreatment -hour(s) 3.5 Hour(s)  Dialyzer Elisio 17H NR  Dialysate 2K, 2.5 Ca  Dialysis Anticoagulant None  Dialysate Flow Ordered 800  Blood Flow Rate Ordered 400 mL/min  Ultrafiltration Goal 2 Liters  Pre Treatment Labs Phosphorus;Other (Comment) (blood culture x2)  Dialysis Blood Pressure Support Ordered Normal  Saline  Education / Care Plan  Dialysis Education Provided Yes  Documented Education in Care Plan Yes  Hemodialysis Catheter Right Subclavian  Placement Date/Time: 08/15/17 0100   Placed prior to admission: Yes  Orientation: Right  Access Location: Subclavian  Site Condition No complications  Blue Lumen Status Heparin locked  Red Lumen Status Heparin locked  Dressing Type Gauze/Drain sponge  Dressing Status Dressing changed  Interventions New dressing  Drainage Description None  Dressing Change Due 01/29/18

## 2018-01-22 NOTE — Progress Notes (Signed)
[facility], [place] 01/22/18  Subjective:  Patient seen and evaluated during hemodialysis. Current blood pressure is 88/66. Patient completely asymptomatic.   Objective:  Vital signs in last 24 hours:  Temp:  [97.5 F (36.4 C)-98.4 F (36.9 C)] 98.4 F (36.9 C) (10/01 1415) Pulse Rate:  [60-84] 68 (10/01 1730) Resp:  [10-26] 11 (10/01 1730) BP: (63-186)/(25-66) 81/49 (10/01 1730) SpO2:  [93 %-100 %] 98 % (10/01 1730) Weight:  [121.2 kg-124.2 kg] 124.2 kg (10/01 1415)  Weight change: -0.8 kg Filed Weights   01/21/18 0500 01/22/18 0500 01/22/18 1415  Weight: 122 kg 121.2 kg 124.2 kg    Intake/Output:    Intake/Output Summary (Last 24 hours) at 01/22/2018 1801 Last data filed at 01/22/2018 0431 Gross per 24 hour  Intake 480 ml  Output 0 ml  Net 480 ml     Physical Exam: General:  No acute distress, laying in the bed  HEENT  moist oral mucous membranes  Neck  supple  Pulm/lungs  normal breathing effort, clear to auscultation  CVS/Heart  regular rhythm, no rub or gallop  Abdomen:   Soft, nontender  Extremities:  No peripheral edema  Neurologic:  Alert, oriented  Skin:  No acute rashes  Access:  Right IJ PermCath       Basic Metabolic Panel:  Recent Labs  Lab 01/19/18 1227 01/20/18 0446  NA 137 136  K 5.1 4.9  CL 95* 98  CO2 28 23  GLUCOSE 121* 114*  BUN 25* 48*  CREATININE 8.26* 11.39*  CALCIUM 10.8* 8.4*     CBC: Recent Labs  Lab 01/19/18 1227 01/20/18 0446  WBC 11.7* 8.8  NEUTROABS 10.0*  --   HGB 13.7 11.9*  HCT 41.1 35.2*  MCV 92.9 93.3  PLT 101* 88*      Lab Results  Component Value Date   HEPBSAG Negative 11/27/2014   HEPBSAB Reactive 09/28/2014   HEPBIGM NON REACTIVE 08/29/2014      Microbiology:  Recent Results (from the past 240 hour(s))  Blood Culture (routine x 2)     Status: None (Preliminary result)   Collection Time: 01/19/18 12:27 PM  Result Value Ref Range Status   Specimen  Description BLOOD LT [facility]  Final   Special Requests   Final    BOTTLES DRAWN AEROBIC AND ANAEROBIC Blood Culture results may not be optimal due to an inadequate volume of blood received in culture bottles   Culture   Final    NO GROWTH 3 DAYS Performed at [facility], [address] [place], [place] [address]    Report Status PENDING  Incomplete  Blood Culture (routine x 2)     Status: None (Preliminary result)   Collection Time: 01/19/18  1:58 PM  Result Value Ref Range Status   Specimen Description BLOOD BLOOD LEFT HAND  Final   Special Requests   Final    BOTTLES DRAWN AEROBIC AND ANAEROBIC Blood Culture adequate volume   Culture   Final    NO GROWTH 3 DAYS Performed at [facility], [address] [place], [place] [address]    Report Status PENDING  Incomplete  MRSA PCR Screening     Status: None   Collection Time: 01/19/18  8:13 PM  Result Value Ref Range Status   MRSA by PCR NEGATIVE NEGATIVE Final    Comment:        The GeneXpert MRSA Assay (FDA approved for NASAL specimens only), is one component of a  comprehensive MRSA colonization surveillance program. It is not intended to diagnose MRSA infection nor to guide or monitor treatment for MRSA infections. Performed at [facility], [address] [place],  [address]     Coagulation Studies: No results for input(s): LABPROT, INR in the last 72 hours.  Urinalysis: No results for input(s): COLORURINE, LABSPEC, PHURINE, GLUCOSEU, HGBUR, BILIRUBINUR, KETONESUR, PROTEINUR, UROBILINOGEN, NITRITE, LEUKOCYTESUR in the last 72 hours.  Invalid input(s): APPERANCEUR    Imaging: Dg Abd 1 View  Result Date: 01/21/2018 CLINICAL DATA:  Intermittent upper abdominal pain EXAM: ABDOMEN - 1 VIEW COMPARISON:  09/01/2014 FINDINGS: Nonobstructed bowel-gas pattern. No radiopaque calculi are visualized. IMPRESSION: Negative. Electronically Signed   By: Donavan Foil M.D.   On: 01/21/2018 19:28      Medications:   . vancomycin 1,000 mg (01/22/18 1653)   . butamben-tetracaine-benzocaine      . calcium acetate  2,001 mg Oral TID WC  . docusate sodium  100 mg Oral BID  . fentaNYL      . gabapentin  600 mg Oral QHS  . heparin  5,000 Units Subcutaneous Q8H  . lidocaine      . midazolam      . midodrine  10 mg Oral TID WC  . sodium chloride flush       acetaminophen **OR** acetaminophen, bisacodyl, HYDROcodone-acetaminophen, morphine injection, ondansetron **OR** ondansetron (ZOFRAN) IV  Assessment/ Plan:  42 y.o. male with end-stage renal disease, hypertension, history of Gullian Barr syndrome   [address]/ [facility] Neph/TTHS/ 4.5 hrs.  1.  End-stage renal disease 2.  Anemia of chronic kidney disease 3.  Hypercalcemia 4.  Fever, low-grade ? Viral  5.  Chronic hypotension  Plan:  Patient seen and evaluated during hemodialysis today.  Tolerating well.  Blood pressure currently 88/66 and patient asymptomatic.  2 blood cultures taken during dialysis treatment.  Temperature currently 98.4 and we will monitor for this.  Watch for chills during dialysis treatment.   LOS: 3 Hawley Michel 10/1/20196:01 PM  [place], [place]  Note: This note was prepared with Dragon dictation. Any transcription errors are unintentional

## 2018-01-22 NOTE — Progress Notes (Signed)
HD xt end    01/22/18 1809  Vital Signs  Pulse Rate 78  Pulse Rate Source Monitor  Resp 13  BP (!) 86/58  BP Location Left Arm  BP Method Automatic  Patient Position (if appropriate) Lying  Oxygen Therapy  SpO2 100 %  O2 Device Room Air  During Hemodialysis Assessment  Dialysis Fluid Bolus Normal Saline  Bolus Amount (mL) 250 mL  Intra-Hemodialysis Comments Tx completed

## 2018-01-22 NOTE — Care Management (Addendum)
Patrick Downs dialysis liaison notified of admission.   Patient goes to [place]

## 2018-01-22 NOTE — Progress Notes (Signed)
HD tx start    01/22/18 1424  Vital Signs  Pulse Rate 69  Pulse Rate Source Monitor  Resp 10  BP (!) 93/30  BP Location Left Leg  BP Method Automatic  Patient Position (if appropriate) Lying  Oxygen Therapy  SpO2 97 %  O2 Device Room Air  During Hemodialysis Assessment  Blood Flow Rate (mL/min) 400 mL/min  Arterial Pressure (mmHg) -200 mmHg  Venous Pressure (mmHg) 120 mmHg  Transmembrane Pressure (mmHg) 70 mmHg  Ultrafiltration Rate (mL/min) 1000 mL/min  Dialysate Flow Rate (mL/min) 800 ml/min  Conductivity: Machine  14.1  HD Safety Checks Performed Yes  Dialysis Fluid Bolus Normal Saline  Bolus Amount (mL) 250 mL  Intra-Hemodialysis Comments Tx initiated  Hemodialysis Catheter Right Subclavian  Placement Date/Time: 08/15/17 0100   Placed prior to admission: Yes  Orientation: Right  Access Location: Subclavian  Blue Lumen Status Infusing  Red Lumen Status Infusing

## 2018-01-23 ENCOUNTER — Encounter (INDEPENDENT_AMBULATORY_CARE_PROVIDER_SITE_OTHER): Payer: Self-pay

## 2018-01-23 ENCOUNTER — Encounter: Payer: Self-pay | Admitting: Cardiovascular Disease

## 2018-01-23 LAB — BENZODIAZEPINES,MS,WB/SP RFX
7-Aminoclonazepam: NEGATIVE ng/mL
Alprazolam: NEGATIVE ng/mL
BENZODIAZEPINES CONFIRM: NEGATIVE
CHLORDIAZEPOXIDE: NEGATIVE ng/mL
Clonazepam: NEGATIVE ng/mL
DESALKYLFLURAZEPAM: NEGATIVE ng/mL
DESMETHYLCHLORDIAZEPOXIDE: NEGATIVE ng/mL
DIAZEPAM: NEGATIVE ng/mL
Desmethyldiazepam: NEGATIVE ng/mL
Flurazepam: NEGATIVE ng/mL
LORAZEPAM: NEGATIVE ng/mL
MIDAZOLAM: NEGATIVE ng/mL
Oxazepam: NEGATIVE ng/mL
TEMAZEPAM: NEGATIVE ng/mL
Triazolam: NEGATIVE ng/mL

## 2018-01-23 LAB — OXYCODONES,MS,WB/SP RFX
OXYMORPHONE: NEGATIVE ng/mL
Oxycocone: NEGATIVE ng/mL
Oxycodones Confirmation: NEGATIVE

## 2018-01-23 LAB — DRUG SCREEN 10 W/CONF, SERUM
AMPHETAMINES, IA: NEGATIVE ng/mL
BARBITURATES, IA: NEGATIVE ug/mL
Benzodiazepines, IA: NEGATIVE ng/mL
Cocaine & Metabolite, IA: NEGATIVE ng/mL
Methadone, IA: NEGATIVE ng/mL
OXYCODONES, IA: NEGATIVE ng/mL
Opiates, IA: NEGATIVE ng/mL
PROPOXYPHENE, IA: NEGATIVE ng/mL
Phencyclidine, IA: NEGATIVE ng/mL

## 2018-01-23 LAB — THC,MS,WB/SP RFX

## 2018-01-23 LAB — OPIATES,MS,WB/SP RFX
6-Acetylmorphine: NEGATIVE
Codeine: NEGATIVE ng/mL
DIHYDROCODEINE: NEGATIVE ng/mL
HYDROMORPHONE: NEGATIVE ng/mL
Hydrocodone: NEGATIVE ng/mL
MORPHINE: NEGATIVE ng/mL
Opiate Confirmation: NEGATIVE

## 2018-01-23 LAB — GLUCOSE, CAPILLARY: Glucose-Capillary: 81 mg/dL (ref 70–99)

## 2018-01-23 MED ORDER — MIDODRINE HCL 10 MG PO TABS: 10.0000 mg | ORAL_TABLET | Freq: Three times a day (TID) | ORAL | 0 refills | Status: DC

## 2018-01-23 MED ORDER — DIPHENHYDRAMINE HCL 50 MG/ML IJ SOLN
25.0000 mg | Freq: Once | INTRAMUSCULAR | Status: AC
  Administered 2018-01-23: 25 mg via INTRAVENOUS
  Filled 2018-01-23: qty 1

## 2018-01-23 NOTE — Discharge Summary (Signed)
[facility] [place] NAME: Patrick Downs    MR#:  [id number]  DATE OF BIRTH:  12-26-1975  DATE OF ADMISSION:  01/19/2018 ADMITTING PHYSICIAN: Epifanio Lesches, MD  DATE OF DISCHARGE: No discharge date for patient encounter.  PRIMARY CARE PHYSICIAN: Leonel Ramsay, MD    ADMISSION DIAGNOSIS:  Body aches [R52] Hypotension, unspecified hypotension type [I95.9]  DISCHARGE DIAGNOSIS:  Active Problems:   Sepsis ([place])   SECONDARY DIAGNOSIS:   Past Medical History:  Diagnosis Date  . Depression   . Dialysis patient ([place])   . ESRD (end stage renal disease) on dialysis ([place])   . Failure to thrive in adult   . Gout   . Guillain Barr syndrome ([place])   . Guillain-Barre ([place])   . HTN (hypertension)   . Kidney failure   . Pneumonia   . Renal insufficiency   . Respiratory failure [facility])     HOSPITAL COURSE:  42 year old male with ESRD on hemodialysis comes from dialysis with fever 100.4  *Acute sepsis Resolved Etiology unknown Treated on our sepsis protocol, received empiric vancomycin/Zosyn, all cultures negative for growth, infectious disease to see patient while in house-recommended continued vancomycin with hemodialysis for 1 week, echocardiogram/TEE negative for endocarditis, cardiology did see patient while in house, and patient did well   *Acute febrile illness Resolved Secondary to unknown etiology Chest x-ray, influenza swab is negative, TEE/echocardiogram negative, blood cultures negative  *Chronic ESRD on HD Nephrology did see patient while in house for hemodialysis needs Typically receives on Tuesdays/Thursdays/Saturdays  *chronic hypotension Improved w/ increased midodrine to 10 mg 3 times daily   DISCHARGE CONDITIONS:  stable  CONSULTS OBTAINED:  Treatment Team:  Murlean Iba, MD Dionisio David, MD  DRUG ALLERGIES:   Allergies  Allergen Reactions  . Ivp Dye [Iodinated Diagnostic  Agents] Other (See Comments)    Reaction unknown  . Ondansetron Other (See Comments)    Stomach pain   . Minoxidil Other (See Comments)    "put fluid around my heart", PERICARDIAL EFFUSION  . Morphine Other (See Comments)    Makes patient aggressive  . Morphine And Related Other (See Comments)    Reaction unknown  . Omnipaque [Iohexol] Itching and Other (See Comments)    Rigors on one occasion, widespread itching on a separate occasion (resolved with Benadryl), tremors    DISCHARGE MEDICATIONS:   Allergies as of 01/23/2018      Reactions   Ivp Dye [iodinated Diagnostic Agents] Other (See Comments)   Reaction unknown   Ondansetron Other (See Comments)   Stomach pain    Minoxidil Other (See Comments)   "put fluid around my heart", PERICARDIAL EFFUSION   Morphine Other (See Comments)   Makes patient aggressive   Morphine And Related Other (See Comments)   Reaction unknown   Omnipaque [iohexol] Itching, Other (See Comments)   Rigors on one occasion, widespread itching on a separate occasion (resolved with Benadryl), tremors      Medication List    STOP taking these medications   HYDROcodone-acetaminophen 5-325 MG tablet Commonly known as:  NORCO/VICODIN     TAKE these medications   calcium acetate 667 MG capsule Commonly known as:  PHOSLO Take 3 capsules (2,001 mg total) by mouth 3 (three) times daily with meals. What changed:    how much to take  how to take this  when to take this  additional instructions   docusate sodium 100 MG capsule Commonly known as:  COLACE Take 200 mg by mouth daily.   gabapentin 300 MG capsule Commonly known as:  NEURONTIN Take 600 mg by mouth at bedtime.   midodrine 10 MG tablet Commonly known as:  PROAMATINE Take 1 tablet (10 mg total) by mouth 3 (three) times daily with meals. What changed:    medication strength  how much to take   sildenafil 20 MG tablet Commonly known as:  REVATIO Take 20-100 mg by mouth as needed  (for ED).        DISCHARGE INSTRUCTIONS:  If you experience worsening of your admission symptoms, develop shortness of breath, life threatening emergency, suicidal or homicidal thoughts you must seek medical attention immediately by calling 911 or calling your MD immediately  if symptoms less severe.  You Must read complete instructions/literature along with all the possible adverse reactions/side effects for all the Medicines you take and that have been prescribed to you. Take any new Medicines after you have completely understood and accept all the possible adverse reactions/side effects.   Please note  You were cared for by a hospitalist during your hospital stay. If you have any questions about your discharge medications or the care you received while you were in the hospital after you are discharged, you can call the unit and asked to speak with the hospitalist on call if the hospitalist that took care of you is not available. Once you are discharged, your primary care physician will handle any further medical issues. Please note that NO REFILLS for any discharge medications will be authorized once you are discharged, as it is imperative that you return to your primary care physician (or establish a relationship with a primary care physician if you do not have one) for your aftercare needs so that they can reassess your need for medications and monitor your lab values.    Today   CHIEF COMPLAINT:   Chief Complaint  Patient presents with  . Code Sepsis    HISTORY OF PRESENT ILLNESS:  42 y.o. male with a known history of ESRD on hemodialysis Tuesday, Thursday, Saturday had fever at dialysis today patient also noted to have hypotension.  Because of fever, hypotension patient was brought in here.  Patient has chronic hypotension, on midodrine and he forgot to take Midrin today.  They are noted to have lactic acid of 2 without evidence of infection chest x-ray has been negative, flu test  has been negative.  Patient denies any complaints, no nausea, vomiting, diarrhea.  Appetite is good.  Has no cough, shortness of breath. Has dialysis access in the right anterior chest looks clean without evidence of infection.  No tenderness over the permacath site.   VITAL SIGNS:  Blood pressure (!) 151/110, pulse 91, temperature 98.2 F (36.8 C), temperature source Oral, resp. rate 20, height 6\' 2"  (1.88 m), weight 122.4 kg, SpO2 98 %.  I/O:    Intake/Output Summary (Last 24 hours) at 01/23/2018 1156 Last data filed at 01/22/2018 1817 Gross per 24 hour  Intake -  Output 537 ml  Net -537 ml    PHYSICAL EXAMINATION:  GENERAL:  42 y.o.-year-old patient lying in the bed with no acute distress.  EYES: Pupils equal, round, reactive to light and accommodation. No scleral icterus. Extraocular muscles intact.  HEENT: Head atraumatic, normocephalic. Oropharynx and nasopharynx clear.  NECK:  Supple, no jugular venous distention. No thyroid enlargement, no tenderness.  LUNGS: Normal breath sounds bilaterally, no wheezing, rales,rhonchi or crepitation. No use of accessory muscles of respiration.  CARDIOVASCULAR: S1, S2 normal. No murmurs, rubs, or gallops.  ABDOMEN: Soft, non-tender, non-distended. Bowel sounds present. No organomegaly or mass.  EXTREMITIES: No pedal edema, cyanosis, or clubbing.  NEUROLOGIC: Cranial nerves II through XII are intact. Muscle strength 5/5 in all extremities. Sensation intact. Gait not checked.  PSYCHIATRIC: The patient is alert and oriented x 3.  SKIN: No obvious rash, lesion, or ulcer.   DATA REVIEW:   CBC Recent Labs  Lab 01/20/18 0446  WBC 8.8  HGB 11.9*  HCT 35.2*  PLT 88*    Chemistries  Recent Labs  Lab 01/19/18 1227 01/20/18 0446  NA 137 136  K 5.1 4.9  CL 95* 98  CO2 28 23  GLUCOSE 121* 114*  BUN 25* 48*  CREATININE 8.26* 11.39*  CALCIUM 10.8* 8.4*  AST 28  --   ALT 27  --   ALKPHOS 61  --   BILITOT 1.0  --     Cardiac  Enzymes No results for input(s): TROPONINI in the last 168 hours.  Microbiology Results  Results for orders placed or performed during the hospital encounter of 01/19/18  Blood Culture (routine x 2)     Status: None (Preliminary result)   Collection Time: 01/19/18 12:27 PM  Result Value Ref Range Status   Specimen Description BLOOD LT [facility]  Final   Special Requests   Final    BOTTLES DRAWN AEROBIC AND ANAEROBIC Blood Culture results may not be optimal due to an inadequate volume of blood received in culture bottles   Culture   Final    NO GROWTH 4 DAYS Performed at [facility], [address] [place], [place] [address]    Report Status PENDING  Incomplete  Blood Culture (routine x 2)     Status: None (Preliminary result)   Collection Time: 01/19/18  1:58 PM  Result Value Ref Range Status   Specimen Description BLOOD BLOOD LEFT HAND  Final   Special Requests   Final    BOTTLES DRAWN AEROBIC AND ANAEROBIC Blood Culture adequate volume   Culture   Final    NO GROWTH 4 DAYS Performed at [facility], [address] [place], [place] [address]    Report Status PENDING  Incomplete  MRSA PCR Screening     Status: None   Collection Time: 01/19/18  8:13 PM  Result Value Ref Range Status   MRSA by PCR NEGATIVE NEGATIVE Final    Comment:        The GeneXpert MRSA Assay (FDA approved for NASAL specimens only), is one component of a comprehensive MRSA colonization surveillance program. It is not intended to diagnose MRSA infection nor to guide or monitor treatment for MRSA infections. Performed at [facility], [address] [place], [place] [address]   Culture, blood (routine x 2)     Status: None (Preliminary result)   Collection Time: 01/22/18  1:25 PM  Result Value Ref Range Status   Specimen Description BLOOD LEFT FOA  Final   Special Requests   Final    BOTTLES DRAWN AEROBIC AND ANAEROBIC Blood Culture results may not be optimal due to an  excessive volume of blood received in culture bottles   Culture   Final    NO GROWTH < 24 HOURS Performed at [facility], [address] [place], [place] [address]    Report Status PENDING  Incomplete  Culture, blood (routine x 2)     Status: None (Preliminary result)   Collection  Time: 01/22/18  1:36 PM  Result Value Ref Range Status   Specimen Description BLOOD LEFT HAND  Final   Special Requests   Final    BOTTLES DRAWN AEROBIC AND ANAEROBIC Blood Culture results may not be optimal due to an excessive volume of blood received in culture bottles   Culture   Final    NO GROWTH < 24 HOURS Performed at [facility], [address], [place], [place] [address]    Report Status PENDING  Incomplete    RADIOLOGY:  Dg Abd 1 View  Result Date: 01/21/2018 CLINICAL DATA:  Intermittent upper abdominal pain EXAM: ABDOMEN - 1 VIEW COMPARISON:  09/01/2014 FINDINGS: Nonobstructed bowel-gas pattern. No radiopaque calculi are visualized. IMPRESSION: Negative. Electronically Signed   By: Donavan Foil M.D.   On: 01/21/2018 19:28    EKG:   Orders placed or performed during the hospital encounter of 01/19/18  . EKG 12-Lead  . EKG 12-Lead  . ED EKG 12-Lead  . ED EKG 12-Lead      Management plans discussed with the patient, family and they are in agreement.  CODE STATUS:     Code Status Orders  (From admission, onward)         Start     Ordered   01/19/18 1643  Full code  Continuous     01/19/18 1644        Code Status History    Date Active Date Inactive Code Status Order ID Comments User Context   08/16/2017 1309 08/17/2017 1914 Full Code [id number]  Arta Silence, MD Inpatient   04/21/2017 2010 04/24/2017 2018 Full Code [id number]  Gorden Harms, MD Inpatient   11/27/2014 0341 11/28/2014 1904 Full Code [id number]  Lance Coon, MD Inpatient   08/26/2014 2138 10/05/2014 1637 Full Code [id number]  Corey Harold Inpatient   08/22/2014 1457 08/26/2014 2138 Full  Code [id number]  Renea Ee, RN Inpatient      TOTAL TIME TAKING CARE OF THIS PATIENT: 45 minutes.    Avel Peace Salary M.D on 01/23/2018 at 11:56 AM  Between 7am to 6pm - Pager - [phone]  After 6pm go to [email or web address] - password [email or web address] [facility]  Office  [phone]  CC: Primary care physician; Leonel Ramsay, MD   Note: This dictation was prepared with Dragon dictation along with smaller phrase technology. Any transcriptional errors that result from this process are unintentional.

## 2018-01-23 NOTE — Care Management (Signed)
Elvera Bicker dialysis liaison notified of discharge.  Per MD patient will require Vanc at outpatient HD.  Amanda aware.

## 2018-01-23 NOTE — Progress Notes (Signed)
Called Dr. Jodell Cipro regarding anti-itch medication.  Appropriate orders were placed.  Patrick Downs 01/23/2018  4:18 AM

## 2018-01-23 NOTE — Progress Notes (Signed)
[place], [place] 01/23/18  Subjective:  Patient seen at bedside.  Doing well. No fever yesterday. All blood cultures remain negative.   Objective:  Vital signs in last 24 hours:  Temp:  [96 F (35.6 C)-98.4 F (36.9 C)] 98.2 F (36.8 C) (10/02 0420) Pulse Rate:  [60-91] 91 (10/02 0420) Resp:  [10-26] 20 (10/02 0420) BP: (63-186)/(25-110) 151/110 (10/02 0420) SpO2:  [92 %-100 %] 98 % (10/02 0420) Weight:  [122.4 kg-124.2 kg] 122.4 kg (10/01 2043)  Weight change: 3 kg Filed Weights   01/22/18 1415 01/22/18 1817 01/22/18 2043  Weight: 124.2 kg 122.5 kg 122.4 kg    Intake/Output:    Intake/Output Summary (Last 24 hours) at 01/23/2018 1021 Last data filed at 01/22/2018 1817 Gross per 24 hour  Intake -  Output 537 ml  Net -537 ml     Physical Exam: General:  No acute distress, laying in the bed  HEENT  moist oral mucous membranes  Neck  supple  Pulm/lungs  normal breathing effort, clear to auscultation  CVS/Heart  regular rhythm, no rub or gallop  Abdomen:   Soft, nontender  Extremities:  No peripheral edema  Neurologic:  Alert, oriented  Skin:  No acute rashes  Access:  Right IJ PermCath       Basic Metabolic Panel:  Recent Labs  Lab 01/19/18 1227 01/20/18 0446  NA 137 136  K 5.1 4.9  CL 95* 98  CO2 28 23  GLUCOSE 121* 114*  BUN 25* 48*  CREATININE 8.26* 11.39*  CALCIUM 10.8* 8.4*     CBC: Recent Labs  Lab 01/19/18 1227 01/20/18 0446  WBC 11.7* 8.8  NEUTROABS 10.0*  --   HGB 13.7 11.9*  HCT 41.1 35.2*  MCV 92.9 93.3  PLT 101* 88*      Lab Results  Component Value Date   HEPBSAG Negative 11/27/2014   HEPBSAB Reactive 09/28/2014   HEPBIGM NON REACTIVE 08/29/2014      Microbiology:  Recent Results (from the past 240 hour(s))  Blood Culture (routine x 2)     Status: None (Preliminary result)   Collection Time: 01/19/18 12:27 PM  Result Value Ref Range Status   Specimen Description BLOOD LT [facility]   Final   Special Requests   Final    BOTTLES DRAWN AEROBIC AND ANAEROBIC Blood Culture results may not be optimal due to an inadequate volume of blood received in culture bottles   Culture   Final    NO GROWTH 4 DAYS Performed at [facility], [address] [place], [place] [address]    Report Status PENDING  Incomplete  Blood Culture (routine x 2)     Status: None (Preliminary result)   Collection Time: 01/19/18  1:58 PM  Result Value Ref Range Status   Specimen Description BLOOD BLOOD LEFT HAND  Final   Special Requests   Final    BOTTLES DRAWN AEROBIC AND ANAEROBIC Blood Culture adequate volume   Culture   Final    NO GROWTH 4 DAYS Performed at [facility], [address] [place], [place] [address]    Report Status PENDING  Incomplete  MRSA PCR Screening     Status: None   Collection Time: 01/19/18  8:13 PM  Result Value Ref Range Status   MRSA by PCR NEGATIVE NEGATIVE Final    Comment:        The GeneXpert MRSA Assay (FDA approved for NASAL specimens only), is one component of a  comprehensive MRSA colonization surveillance program. It is not intended to diagnose MRSA infection nor to guide or monitor treatment for MRSA infections. Performed at [facility], [address] [place], [place] [address]   Culture, blood (routine x 2)     Status: None (Preliminary result)   Collection Time: 01/22/18  1:25 PM  Result Value Ref Range Status   Specimen Description BLOOD LEFT FOA  Final   Special Requests   Final    BOTTLES DRAWN AEROBIC AND ANAEROBIC Blood Culture results may not be optimal due to an excessive volume of blood received in culture bottles   Culture   Final    NO GROWTH < 24 HOURS Performed at [facility], [address], [place], [place] [address]    Report Status PENDING  Incomplete  Culture, blood (routine x 2)     Status: None (Preliminary result)   Collection Time: 01/22/18  1:36 PM  Result Value Ref Range  Status   Specimen Description BLOOD LEFT HAND  Final   Special Requests   Final    BOTTLES DRAWN AEROBIC AND ANAEROBIC Blood Culture results may not be optimal due to an excessive volume of blood received in culture bottles   Culture   Final    NO GROWTH < 24 HOURS Performed at [facility], [address] [place], [place] [address]    Report Status PENDING  Incomplete    Coagulation Studies: No results for input(s): LABPROT, INR in the last 72 hours.  Urinalysis: No results for input(s): COLORURINE, LABSPEC, PHURINE, GLUCOSEU, HGBUR, BILIRUBINUR, KETONESUR, PROTEINUR, UROBILINOGEN, NITRITE, LEUKOCYTESUR in the last 72 hours.  Invalid input(s): APPERANCEUR    Imaging: Dg Abd 1 View  Result Date: 01/21/2018 CLINICAL DATA:  Intermittent upper abdominal pain EXAM: ABDOMEN - 1 VIEW COMPARISON:  09/01/2014 FINDINGS: Nonobstructed bowel-gas pattern. No radiopaque calculi are visualized. IMPRESSION: Negative. Electronically Signed   By: Donavan Foil M.D.   On: 01/21/2018 19:28     Medications:   . vancomycin 1,000 mg (01/22/18 1653)   . calcium acetate  2,001 mg Oral TID WC  . docusate sodium  100 mg Oral BID  . gabapentin  600 mg Oral QHS  . heparin  5,000 Units Subcutaneous Q8H  . midodrine  10 mg Oral TID WC   acetaminophen **OR** acetaminophen, bisacodyl, HYDROcodone-acetaminophen, morphine injection, ondansetron **OR** ondansetron (ZOFRAN) IV  Assessment/ Plan:  42 y.o. male with end-stage renal disease, hypertension, history of Gullian Barr syndrome   [place]/ [facility] Neph/TTHS/ 4.5 hrs.  1.  End-stage renal disease 2.  Anemia of chronic kidney disease, hgb 11.9 3.  Hypercalcemia, resolved. 4.  Fever, low-grade ? Viral.  Blood culture remain negative.  5.  Chronic hypotension, low blood pressure during treatment.  Plan:  Patient completed HD yesterday.  No acute indication for HD today.  Outpatient cultures have thus far not resulted.  However for  blood cultures drawn here remain negative.  Patient did have blood pressure drop during dialysis yesterday.  Continue midodrine 10 mg 3 times daily.  We will continue to monitor hemoglobin as well as serum phosphorus over the course of the hospitalization.  TEE was negative for vegetation.    LOS: 4 Elvert Cumpton 10/2/201910:21 AM  [place], [place]  Note: This note was prepared with Dragon dictation. Any transcription errors are unintentional

## 2018-01-23 NOTE — Progress Notes (Signed)
SUBJECTIVE: Pt is feeling well. No chest pain or shortness of breath.   Vitals:   01/22/18 1821 01/22/18 1845 01/22/18 2043 01/23/18 0420  BP: (!) 80/52 (!) 113/33 (!) 104/50 (!) 151/110  Pulse:  79 77 91  Resp: 13 12 20 20   Temp:  (!) 97.5 F (36.4 C) 97.8 F (36.6 C) 98.2 F (36.8 C)  TempSrc:  Oral Oral Oral  SpO2:  98% 100% 98%  Weight:   122.4 kg   Height:        Intake/Output Summary (Last 24 hours) at 01/23/2018 0825 Last data filed at 01/22/2018 1817 Gross per 24 hour  Intake -  Output 537 ml  Net -537 ml    LABS: Basic Metabolic Panel: No results for input(s): NA, K, CL, CO2, GLUCOSE, BUN, CREATININE, CALCIUM, MG, PHOS in the last 72 hours. Liver Function Tests: No results for input(s): AST, ALT, ALKPHOS, BILITOT, PROT, ALBUMIN in the last 72 hours. No results for input(s): LIPASE, AMYLASE in the last 72 hours. CBC: No results for input(s): WBC, NEUTROABS, HGB, HCT, MCV, PLT in the last 72 hours. Cardiac Enzymes: No results for input(s): CKTOTAL, CKMB, CKMBINDEX, TROPONINI in the last 72 hours. BNP: Invalid input(s): POCBNP D-Dimer: No results for input(s): DDIMER in the last 72 hours. Hemoglobin A1C: No results for input(s): HGBA1C in the last 72 hours. Fasting Lipid Panel: No results for input(s): CHOL, HDL, LDLCALC, TRIG, CHOLHDL, LDLDIRECT in the last 72 hours. Thyroid Function Tests: No results for input(s): TSH, T4TOTAL, T3FREE, THYROIDAB in the last 72 hours.  Invalid input(s): FREET3 Anemia Panel: No results for input(s): VITAMINB12, FOLATE, FERRITIN, TIBC, IRON, RETICCTPCT in the last 72 hours.   PHYSICAL EXAM General: Well developed, well nourished, in no acute distress HEENT:  Normocephalic and atramatic Neck:  No JVD.  Lungs: Clear bilaterally to auscultation and percussion. Heart: HRRR . Normal S1 and S2 without gallops or murmurs.  Abdomen: Bowel sounds are positive, abdomen soft and non-tender  Msk:  Back normal, normal gait. Normal  strength and tone for age. Extremities: No clubbing, cyanosis or edema.   Neuro: Alert and oriented X 3. Psych:  Good affect, responds appropriately  TELEMETRY: NSR 80bpm  ASSESSMENT AND PLAN: Sepsis with end stage renal disease and anemia of chronic disease. Endocarditis was ruled out with TEE which showed no vegetation on mitral, tricuspid, pulmonic, or aortic valves. LV ejection fraction was normal.  Blood pressure is labile, continue to monitor and provide standard care and monitoring for HD and renal disease. Blood cultures pending.  Active Problems:   Sepsis ([place])    Jake Bathe, NP-C 01/23/2018 8:25 AM Cell: [phone]

## 2018-01-24 LAB — CULTURE, BLOOD (ROUTINE X 2)
Culture: NO GROWTH
Culture: NO GROWTH
SPECIAL REQUESTS: ADEQUATE

## 2018-01-25 ENCOUNTER — Other Ambulatory Visit (INDEPENDENT_AMBULATORY_CARE_PROVIDER_SITE_OTHER): Payer: Self-pay | Admitting: Nurse Practitioner

## 2018-01-27 LAB — CULTURE, BLOOD (ROUTINE X 2)
CULTURE: NO GROWTH
Culture: NO GROWTH

## 2018-01-29 ENCOUNTER — Encounter: Payer: Self-pay | Admitting: Anesthesiology

## 2018-01-29 ENCOUNTER — Ambulatory Visit: Payer: Medicare Other | Attending: Anesthesiology | Admitting: Anesthesiology

## 2018-01-29 ENCOUNTER — Other Ambulatory Visit: Payer: Self-pay

## 2018-01-29 VITALS — BP 75/53 | HR 95 | Temp 98.2°F | Resp 18 | Ht 74.0 in | Wt 254.6 lb

## 2018-01-29 DIAGNOSIS — G8929 Other chronic pain: Secondary | ICD-10-CM

## 2018-01-29 DIAGNOSIS — M5136 Other intervertebral disc degeneration, lumbar region: Secondary | ICD-10-CM

## 2018-01-29 DIAGNOSIS — Z992 Dependence on renal dialysis: Secondary | ICD-10-CM

## 2018-01-29 DIAGNOSIS — M47816 Spondylosis without myelopathy or radiculopathy, lumbar region: Secondary | ICD-10-CM | POA: Diagnosis not present

## 2018-01-29 DIAGNOSIS — R531 Weakness: Secondary | ICD-10-CM | POA: Diagnosis not present

## 2018-01-29 DIAGNOSIS — F1190 Opioid use, unspecified, uncomplicated: Secondary | ICD-10-CM

## 2018-01-29 DIAGNOSIS — R29898 Other symptoms and signs involving the musculoskeletal system: Secondary | ICD-10-CM | POA: Diagnosis not present

## 2018-01-29 DIAGNOSIS — Z79891 Long term (current) use of opiate analgesic: Secondary | ICD-10-CM | POA: Insufficient documentation

## 2018-01-29 DIAGNOSIS — G894 Chronic pain syndrome: Secondary | ICD-10-CM

## 2018-01-29 DIAGNOSIS — G610 Guillain-Barre syndrome: Secondary | ICD-10-CM

## 2018-01-29 DIAGNOSIS — Z79899 Other long term (current) drug therapy: Secondary | ICD-10-CM | POA: Insufficient documentation

## 2018-01-29 DIAGNOSIS — M545 Low back pain: Secondary | ICD-10-CM | POA: Diagnosis not present

## 2018-01-29 DIAGNOSIS — N186 End stage renal disease: Secondary | ICD-10-CM

## 2018-01-29 MED ORDER — OXYCODONE-ACETAMINOPHEN 5-325 MG PO TABS: 1.0000 | ORAL_TABLET | Freq: Four times a day (QID) | ORAL | 0 refills | Status: DC | PRN

## 2018-01-29 NOTE — Patient Instructions (Signed)
You were given one prescription for Percocet today.

## 2018-01-29 NOTE — Progress Notes (Signed)
Nursing Pain Medication Assessment:  ?Safety precautions to be maintained throughout the outpatient stay will include: orient to surroundings, keep bed in low position, maintain call bell within reach at all times, provide assistance with transfer out of bed and ambulation.  ?Medication Inspection Compliance: Patrick Downs did not comply with our request to bring his pills to be counted. He was reminded that bringing the medication bottles, even when empty, is a requirement. ? ?Medication: None brought in. ?Pill/Patch Count: None available to be counted. ?Bottle Appearance: No container available. Did not bring bottle(s) to appointment. ?Filled Date: N/A ?Last Medication intake:  Day before yesterday ?

## 2018-01-30 NOTE — Progress Notes (Signed)
Subjective:  Patient ID: Patrick Downs, male    DOB: [birth year]  Age: 42 y.o. MRN: [id number]  CC: Back Pain (low)   Procedure: None  HPI Patrick Downs presents for reevaluation.  He was last seen in September.  He continues to have complaints of low back pain lower extremity pain and diffuse body pain.  The quality of this pain is without change.  He describes it as severe and unremitting.  He has been on chronic opioid therapy for it in the past and this is given him good relief.  He is recently started hydrocodone taking this 3 times a day and this is giving him some relief but is had better success with oxycodone in the past.  He has been on 10 mg 3 times a day he reports.  At this point he denies any untoward side effects sedation or other grogginess noted with the medication.  He defines his degree of relief as good with the medication lasting approximately 4 hours at a time.  At that point he begins to have breakthrough pain on the current 3 times a day dosing.  No other untoward side effects are noted.  He denies any diverting or illicit use.  No change in his bowel or bladder function is noted in his lower extremity strength and function of been stable.  Outpatient Medications Prior to Visit  Medication Sig Dispense Refill  . calcium acetate (PHOSLO) 667 MG capsule Take 3 capsules (2,001 mg total) by mouth 3 (three) times daily with meals. (Patient taking differently: See admin instructions. Take 3 capsules (2001MG ) by mouth 3 times daily with meals and 2 capsules (1334MG ) by mouth daily with snacks) 1 capsule 0  . docusate sodium (COLACE) 100 MG capsule Take 200 mg by mouth daily.    Marland Kitchen gabapentin (NEURONTIN) 300 MG capsule Take 600 mg by mouth at bedtime.     Marland Kitchen HYDROcodone-acetaminophen (NORCO/VICODIN) 5-325 MG tablet Take 1 tablet by mouth 3 (three) times daily.    . midodrine (PROAMATINE) 10 MG tablet Take 1 tablet (10 mg total) by mouth 3 (three) times daily with meals. 90 tablet  0  . sildenafil (REVATIO) 20 MG tablet Take 20-100 mg by mouth as needed (for ED).      No facility-administered medications prior to visit.     Review of Systems CNS: No confusion or sedation Cardiac: No angina or palpitations GI: No abdominal pain or constipation Constitutional: No nausea vomiting fevers or chills  Objective:  BP (!) 75/53 Comment: left 69/45  Pulse 95   Temp 98.2 F (36.8 C) (Oral)   Resp 18   Ht 6\' 2"  (1.88 m)   Wt 254 lb 10.1 oz (115.5 kg)   SpO2 99%   BMI 32.69 kg/m    BP Readings from Last 3 Encounters:  01/29/18 (!) 75/53  01/23/18 (!) 151/110  01/08/18 (!) 82/49     Wt Readings from Last 3 Encounters:  01/29/18 254 lb 10.1 oz (115.5 kg)  01/22/18 269 lb 13.5 oz (122.4 kg)  01/08/18 260 lb (117.9 kg)     Physical Exam Pt is alert and oriented PERRL EOMI HEART IS RRR no murmur or rub LCTA no wheezing or rales MUSCULOSKELETAL no change in lower extremity strength or muscle tone or bulk are noted.  Labs  Lab Results  Component Value Date   HGBA1C 5.5 08/16/2017   Lab Results  Component Value Date   CREATININE 11.39 (H) 01/20/2018    --------------------------------------------------------------------------------------------------------------------  Lab Results  Component Value Date   WBC 8.8 01/20/2018   HGB 11.9 (L) 01/20/2018   HCT 35.2 (L) 01/20/2018   PLT 88 (L) 01/20/2018   GLUCOSE 114 (H) 01/20/2018   TRIG 673 (H) 08/21/2014   ALT 27 01/19/2018   AST 28 01/19/2018   NA 136 01/20/2018   K 4.9 01/20/2018   CL 98 01/20/2018   CREATININE 11.39 (H) 01/20/2018   BUN 48 (H) 01/20/2018   CO2 23 01/20/2018   TSH 5.560 (H) 08/27/2014   INR 1.46 08/31/2014   HGBA1C 5.5 08/16/2017    --------------------------------------------------------------------------------------------------------------------- No results found.   Assessment & Plan:   Juandaniel was seen today for back pain.  Diagnoses and all orders for this  visit:  DDD (degenerative disc disease), lumbar  Chronic bilateral low back pain without sciatica  Facet arthritis of lumbar region  Weakness of both lower extremities  Chronic, continuous use of opioids  Chronic pain syndrome  ESRD on dialysis ([place])  Guillain-Barre syndrome ([place])  Other orders -     oxyCODONE-acetaminophen (PERCOCET/ROXICET) 5-325 MG tablet; Take 1 tablet by mouth every 6 (six) hours as needed for severe pain.        ----------------------------------------------------------------------------------------------------------------------  Problem List Items Addressed This Visit      Unprioritized   ESRD on dialysis [facility])    Other Visit Diagnoses    DDD (degenerative disc disease), lumbar    -  Primary   Relevant Medications   HYDROcodone-acetaminophen (NORCO/VICODIN) 5-325 MG tablet   oxyCODONE-acetaminophen (PERCOCET/ROXICET) 5-325 MG tablet   Chronic bilateral low back pain without sciatica       Relevant Medications   HYDROcodone-acetaminophen (NORCO/VICODIN) 5-325 MG tablet   oxyCODONE-acetaminophen (PERCOCET/ROXICET) 5-325 MG tablet   Facet arthritis of lumbar region       Relevant Medications   HYDROcodone-acetaminophen (NORCO/VICODIN) 5-325 MG tablet   oxyCODONE-acetaminophen (PERCOCET/ROXICET) 5-325 MG tablet   Weakness of both lower extremities       Chronic, continuous use of opioids       Chronic pain syndrome       Guillain-Barre syndrome ([place])            ----------------------------------------------------------------------------------------------------------------------  1. DDD (degenerative disc disease), lumbar He refuses interventional therapy as he maintains that the spinal injection is what caused much of his lower extremity pain when evaluated for his Guyon Barr.  Secondary to the Elfredia Nevins he has persistent lower extremity weakness  2. Chronic bilateral low back pain without sciatica As above with chronic lower  extremity and back pain  3. Facet arthritis of lumbar region As above  4. Weakness of both lower extremities As above  5. Chronic, continuous use of opioids We have reviewed the [facility] practitioner database information and it is appropriate.  I feel that is reasonable to switch him over to Percocet for a 5 mg tablet 4 times a day at this point.  Previously he was on 10 mg 4 times a day.  Of note his serum drug screen did reveal marijuana.  This was on the serum drawn at the time of his last evaluation.  We have had a long discussion regarding clinic policy and that we will check serum drug screen monthly for an extended period of time secondary to this.  Should he come back positive again that will fulfill criteria for termination from the clinic per clinic policy.  He understands this fully and will comply.  6. Chronic pain syndrome As above.  7. ESRD  on dialysis ([place])   8. Guillain-Barre syndrome ([place])     ----------------------------------------------------------------------------------------------------------------------  I am having [name] start on oxyCODONE-acetaminophen. I am also having him maintain his calcium acetate, gabapentin, sildenafil, docusate sodium, midodrine, and HYDROcodone-acetaminophen.   Meds ordered this encounter  Medications  . oxyCODONE-acetaminophen (PERCOCET/ROXICET) 5-325 MG tablet    Sig: Take 1 tablet by mouth every 6 (six) hours as needed for severe pain.    Dispense:  120 tablet    Refill:  0   Patient's Medications  New Prescriptions   OXYCODONE-ACETAMINOPHEN (PERCOCET/ROXICET) 5-325 MG TABLET    Take 1 tablet by mouth every 6 (six) hours as needed for severe pain.  Previous Medications   CALCIUM ACETATE (PHOSLO) 667 MG CAPSULE    Take 3 capsules (2,001 mg total) by mouth 3 (three) times daily with meals.   DOCUSATE SODIUM (COLACE) 100 MG CAPSULE    Take 200 mg by mouth daily.   GABAPENTIN (NEURONTIN) 300 MG CAPSULE    Take  600 mg by mouth at bedtime.    HYDROCODONE-ACETAMINOPHEN (NORCO/VICODIN) 5-325 MG TABLET    Take 1 tablet by mouth 3 (three) times daily.   MIDODRINE (PROAMATINE) 10 MG TABLET    Take 1 tablet (10 mg total) by mouth 3 (three) times daily with meals.   SILDENAFIL (REVATIO) 20 MG TABLET    Take 20-100 mg by mouth as needed (for ED).   Modified Medications   No medications on file  Discontinued Medications   No medications on file   ----------------------------------------------------------------------------------------------------------------------  Follow-up: Return in about 1 month (around 03/01/2018) for evaluation, med refill.    Molli Barrows, MD

## 2018-01-31 ENCOUNTER — Inpatient Hospital Stay: Admit: 2018-01-31 | Payer: Medicare Other

## 2018-02-06 ENCOUNTER — Encounter
Admission: RE | Admit: 2018-02-06 | Discharge: 2018-02-06 | Disposition: A | Discharge status: Home / Self Care | Payer: Medicare Other | Source: Ambulatory Visit | Attending: Vascular Surgery | Admitting: Vascular Surgery

## 2018-02-06 ENCOUNTER — Other Ambulatory Visit: Payer: Self-pay

## 2018-02-06 ENCOUNTER — Ambulatory Visit (INDEPENDENT_AMBULATORY_CARE_PROVIDER_SITE_OTHER): Payer: Medicare Other | Admitting: Nurse Practitioner

## 2018-02-06 DIAGNOSIS — Z79899 Other long term (current) drug therapy: Secondary | ICD-10-CM | POA: Diagnosis not present

## 2018-02-06 DIAGNOSIS — T82898A Other specified complication of vascular prosthetic devices, implants and grafts, initial encounter: Secondary | ICD-10-CM | POA: Diagnosis present

## 2018-02-06 DIAGNOSIS — Y832 Surgical operation with anastomosis, bypass or graft as the cause of abnormal reaction of the patient, or of later complication, without mention of misadventure at the time of the procedure: Secondary | ICD-10-CM | POA: Diagnosis not present

## 2018-02-06 DIAGNOSIS — Z992 Dependence on renal dialysis: Secondary | ICD-10-CM | POA: Diagnosis not present

## 2018-02-06 DIAGNOSIS — I120 Hypertensive chronic kidney disease with stage 5 chronic kidney disease or end stage renal disease: Secondary | ICD-10-CM | POA: Diagnosis not present

## 2018-02-06 DIAGNOSIS — N179 Acute kidney failure, unspecified: Secondary | ICD-10-CM | POA: Diagnosis not present

## 2018-02-06 DIAGNOSIS — N186 End stage renal disease: Secondary | ICD-10-CM | POA: Diagnosis not present

## 2018-02-06 DIAGNOSIS — Z9889 Other specified postprocedural states: Secondary | ICD-10-CM | POA: Diagnosis not present

## 2018-02-06 DIAGNOSIS — Z8619 Personal history of other infectious and parasitic diseases: Secondary | ICD-10-CM | POA: Diagnosis not present

## 2018-02-06 DIAGNOSIS — Z89429 Acquired absence of other toe(s), unspecified side: Secondary | ICD-10-CM | POA: Diagnosis not present

## 2018-02-06 DIAGNOSIS — G610 Guillain-Barre syndrome: Secondary | ICD-10-CM | POA: Diagnosis not present

## 2018-02-06 LAB — CBC WITH DIFFERENTIAL/PLATELET
ABS IMMATURE GRANULOCYTES: 0.02 10*3/uL (ref 0.00–0.07)
BASOS ABS: 0 10*3/uL (ref 0.0–0.1)
BASOS PCT: 1 %
EOS ABS: 0 10*3/uL (ref 0.0–0.5)
Eosinophils Relative: 1 %
HCT: 32.2 % — ABNORMAL LOW (ref 39.0–52.0)
Hemoglobin: 10.2 g/dL — ABNORMAL LOW (ref 13.0–17.0)
IMMATURE GRANULOCYTES: 0 %
Lymphocytes Relative: 20 %
Lymphs Abs: 1.2 10*3/uL (ref 0.7–4.0)
MCH: 29.7 pg (ref 26.0–34.0)
MCHC: 31.7 g/dL (ref 30.0–36.0)
MCV: 93.9 fL (ref 80.0–100.0)
Monocytes Absolute: 0.8 10*3/uL (ref 0.1–1.0)
Monocytes Relative: 13 %
NEUTROS ABS: 4.2 10*3/uL (ref 1.7–7.7)
NEUTROS PCT: 65 %
NRBC: 0 % (ref 0.0–0.2)
PLATELETS: 169 10*3/uL (ref 150–400)
RBC: 3.43 MIL/uL — ABNORMAL LOW (ref 4.22–5.81)
RDW: 15.9 % — AB (ref 11.5–15.5)
WBC: 6.3 10*3/uL (ref 4.0–10.5)

## 2018-02-06 LAB — PROTIME-INR
INR: 1.13
Prothrombin Time: 14.4 seconds (ref 11.4–15.2)

## 2018-02-06 LAB — BASIC METABOLIC PANEL
ANION GAP: 16 — AB (ref 5–15)
BUN: 60 mg/dL — ABNORMAL HIGH (ref 6–20)
CALCIUM: 8.3 mg/dL — AB (ref 8.9–10.3)
CO2: 23 mmol/L (ref 22–32)
Chloride: 101 mmol/L (ref 98–111)
Creatinine, Ser: 13.96 mg/dL — ABNORMAL HIGH (ref 0.61–1.24)
GFR, EST AFRICAN AMERICAN: 4 mL/min — AB (ref >60)
GFR, EST NON AFRICAN AMERICAN: 4 mL/min — AB (ref >60)
Glucose, Bld: 93 mg/dL (ref 70–99)
POTASSIUM: 4.5 mmol/L (ref 3.5–5.1)
Sodium: 140 mmol/L (ref 135–145)

## 2018-02-06 LAB — TYPE AND SCREEN
ABO/RH(D): O POS
ANTIBODY SCREEN: NEGATIVE

## 2018-02-06 LAB — APTT: APTT: 44 s — AB (ref 24–36)

## 2018-02-06 MED ORDER — CEFAZOLIN SODIUM-DEXTROSE 1-4 GM/50ML-% IV SOLN
1.0000 g | INTRAVENOUS | Status: AC
  Administered 2018-02-07: 1 g via INTRAVENOUS

## 2018-02-06 NOTE — Patient Instructions (Signed)
Your procedure is scheduled on: 02/07/18 Report to [facility]. To find out your arrival time please call [phone] between 1PM - 3PM on 02/06/18.  Remember: Instructions that are not followed completely may result in serious medical risk, up to and including death, or upon the discretion of your surgeon and anesthesiologist your surgery may need to be rescheduled.     _X__ 1. Do not eat food after midnight the night before your procedure.                 No gum chewing or hard candies. You may drink clear liquids up to 2 hours                 before you are scheduled to arrive for your surgery- DO not drink clear                 liquids within 2 hours of the start of your surgery.                 Clear Liquids include:  water, apple juice without pulp, clear carbohydrate                 drink such as Clearfast or Gatorade, Black Coffee or Tea (Do not add                 anything to coffee or tea).  __X__2.  On the morning of surgery brush your teeth with toothpaste and water, you                 may rinse your mouth with mouthwash if you wish.  Do not swallow any              toothpaste of mouthwash.     _X__ 3.  No Alcohol for 24 hours before or after surgery.   _X__ 4.  Do Not Smoke or use e-cigarettes For 24 Hours Prior to Your Surgery.                 Do not use any chewable tobacco products for at least 6 hours prior to                 surgery.  ____  5.  Bring all medications with you on the day of surgery if instructed.   __X__  6.  Notify your doctor if there is any change in your medical condition      (cold, fever, infections).     Do not wear jewelry, make-up, hairpins, clips or nail polish. Do not wear lotions, powders, or perfumes.  Do not shave 48 hours prior to surgery. Men may shave face and neck. Do not bring valuables to the hospital.    [facility] is not responsible for any belongings or  valuables.  Contacts, dentures/partials or body piercings may not be worn into surgery. Bring a case for your contacts, glasses or hearing aids, a denture cup will be supplied. Leave your suitcase in the car. After surgery it may be brought to your room. For patients admitted to the hospital, discharge time is determined by your treatment team.   Patients discharged the day of surgery will not be allowed to drive home.   Please read over the following fact sheets that you were given:   MRSA Information  __X__ Take these medicines the morning of surgery with A SIP OF WATER:    1. midodrine (  PROAMATINE)  2.   3.   4.  5.  6.  ____ Fleet Enema (as directed)   __X__ Use CHG Soap/SAGE wipes as directed  ____ Use inhalers on the day of surgery  ____ Stop metformin/Janumet/Farxiga 2 days prior to surgery    ____ Take 1/2 of usual insulin dose the night before surgery. No insulin the morning          of surgery.   ____ Stop Blood Thinners Coumadin/Plavix/Xarelto/Pleta/Pradaxa/Eliquis/Effient/Aspirin  on   Or contact your Surgeon, Cardiologist or Medical Doctor regarding  ability to stop your blood thinners  __X__ Stop Anti-inflammatories 7 days before surgery such as Advil, Ibuprofen, Motrin,  BC or Goodies Powder, Naprosyn, Naproxen, Aleve, Aspirin    __X__ Stop all herbal supplements, fish oil or vitamin E until after surgery.    ____ Bring C-Pap to the hospital.

## 2018-02-07 ENCOUNTER — Ambulatory Visit
Admission: RE | Admit: 2018-02-07 | Discharge: 2018-02-07 | Disposition: A | Discharge status: Home / Self Care | Payer: Medicare Other | Source: Ambulatory Visit | Attending: Vascular Surgery | Admitting: Vascular Surgery

## 2018-02-07 ENCOUNTER — Other Ambulatory Visit: Payer: Self-pay

## 2018-02-07 ENCOUNTER — Ambulatory Visit: Payer: Medicare Other | Admitting: Certified Registered"

## 2018-02-07 ENCOUNTER — Encounter
Admission: RE | Disposition: A | Discharge status: Home / Self Care | Payer: Self-pay | Source: Ambulatory Visit | Attending: Vascular Surgery

## 2018-02-07 ENCOUNTER — Encounter: Payer: Self-pay | Admitting: *Deleted

## 2018-02-07 DIAGNOSIS — T82898A Other specified complication of vascular prosthetic devices, implants and grafts, initial encounter: Secondary | ICD-10-CM | POA: Diagnosis not present

## 2018-02-07 DIAGNOSIS — Z992 Dependence on renal dialysis: Secondary | ICD-10-CM | POA: Insufficient documentation

## 2018-02-07 DIAGNOSIS — Z79899 Other long term (current) drug therapy: Secondary | ICD-10-CM | POA: Insufficient documentation

## 2018-02-07 DIAGNOSIS — Z8619 Personal history of other infectious and parasitic diseases: Secondary | ICD-10-CM | POA: Insufficient documentation

## 2018-02-07 DIAGNOSIS — G610 Guillain-Barre syndrome: Secondary | ICD-10-CM | POA: Insufficient documentation

## 2018-02-07 DIAGNOSIS — Y832 Surgical operation with anastomosis, bypass or graft as the cause of abnormal reaction of the patient, or of later complication, without mention of misadventure at the time of the procedure: Secondary | ICD-10-CM | POA: Insufficient documentation

## 2018-02-07 DIAGNOSIS — N186 End stage renal disease: Secondary | ICD-10-CM | POA: Insufficient documentation

## 2018-02-07 DIAGNOSIS — Z9889 Other specified postprocedural states: Secondary | ICD-10-CM | POA: Insufficient documentation

## 2018-02-07 DIAGNOSIS — Z89429 Acquired absence of other toe(s), unspecified side: Secondary | ICD-10-CM | POA: Insufficient documentation

## 2018-02-07 DIAGNOSIS — N179 Acute kidney failure, unspecified: Secondary | ICD-10-CM | POA: Insufficient documentation

## 2018-02-07 DIAGNOSIS — I120 Hypertensive chronic kidney disease with stage 5 chronic kidney disease or end stage renal disease: Secondary | ICD-10-CM | POA: Insufficient documentation

## 2018-02-07 HISTORY — PX: REVISON OF ARTERIOVENOUS FISTULA: SHX6074

## 2018-02-07 LAB — POCT I-STAT 4, (NA,K, GLUC, HGB,HCT)
GLUCOSE: 88 mg/dL (ref 70–99)
HCT: 31 % — ABNORMAL LOW (ref 39.0–52.0)
Hemoglobin: 10.5 g/dL — ABNORMAL LOW (ref 13.0–17.0)
POTASSIUM: 4 mmol/L (ref 3.5–5.1)
Sodium: 138 mmol/L (ref 135–145)

## 2018-02-07 SURGERY — REVISON OF ARTERIOVENOUS FISTULA
Anesthesia: General | Laterality: Right

## 2018-02-07 MED ORDER — HEPARIN SODIUM (PORCINE) 1000 UNIT/ML IJ SOLN
INTRAMUSCULAR | Status: DC | PRN
  Administered 2018-02-07: 3000 [IU] via INTRAVENOUS

## 2018-02-07 MED ORDER — MIDAZOLAM HCL 2 MG/2ML IJ SOLN
INTRAMUSCULAR | Status: DC | PRN
  Administered 2018-02-07: 2 mg via INTRAVENOUS

## 2018-02-07 MED ORDER — EPHEDRINE SULFATE 50 MG/ML IJ SOLN
INTRAMUSCULAR | Status: DC | PRN
  Administered 2018-02-07 (×5): 10 mg via INTRAVENOUS
  Administered 2018-02-07: 5 mg via INTRAVENOUS
  Administered 2018-02-07: 10 mg via INTRAVENOUS
  Administered 2018-02-07: 5 mg via INTRAVENOUS
  Administered 2018-02-07: 10 mg via INTRAVENOUS

## 2018-02-07 MED ORDER — HYDROCODONE-ACETAMINOPHEN 5-325 MG PO TABS
2.0000 | ORAL_TABLET | Freq: Once | ORAL | Status: AC
  Administered 2018-02-07: 2 via ORAL

## 2018-02-07 MED ORDER — SEVOFLURANE IN SOLN
RESPIRATORY_TRACT | Status: AC
  Filled 2018-02-07: qty 250

## 2018-02-07 MED ORDER — SODIUM CHLORIDE FLUSH 0.9 % IV SOLN
INTRAVENOUS | Status: AC
  Filled 2018-02-07: qty 20

## 2018-02-07 MED ORDER — DIPHENHYDRAMINE HCL 50 MG/ML IJ SOLN
12.5000 mg | Freq: Once | INTRAMUSCULAR | Status: AC
  Administered 2018-02-07: 12.5 mg via INTRAVENOUS

## 2018-02-07 MED ORDER — ONDANSETRON HCL 4 MG/2ML IJ SOLN
INTRAMUSCULAR | Status: AC
  Filled 2018-02-07: qty 2

## 2018-02-07 MED ORDER — FAMOTIDINE 20 MG PO TABS: 20.0000 mg | ORAL_TABLET | Freq: Once | ORAL | Status: DC

## 2018-02-07 MED ORDER — KETAMINE HCL 10 MG/ML IJ SOLN
INTRAMUSCULAR | Status: DC | PRN
  Administered 2018-02-07: 20 mg via INTRAVENOUS
  Administered 2018-02-07: 50 mg via INTRAVENOUS
  Administered 2018-02-07: 10 mg via INTRAVENOUS
  Administered 2018-02-07: 20 mg via INTRAVENOUS
  Administered 2018-02-07: 10 mg via INTRAVENOUS

## 2018-02-07 MED ORDER — EPHEDRINE SULFATE 50 MG/ML IJ SOLN
INTRAMUSCULAR | Status: AC
  Filled 2018-02-07: qty 1

## 2018-02-07 MED ORDER — GLYCOPYRROLATE 0.2 MG/ML IJ SOLN
INTRAMUSCULAR | Status: AC
  Filled 2018-02-07: qty 1

## 2018-02-07 MED ORDER — MIDAZOLAM HCL 2 MG/2ML IJ SOLN
INTRAMUSCULAR | Status: AC
  Filled 2018-02-07: qty 2

## 2018-02-07 MED ORDER — SODIUM CHLORIDE 0.9 % IJ SOLN
INTRAMUSCULAR | Status: AC
  Filled 2018-02-07: qty 100

## 2018-02-07 MED ORDER — KETAMINE HCL 50 MG/ML IJ SOLN
INTRAMUSCULAR | Status: AC
  Filled 2018-02-07: qty 10

## 2018-02-07 MED ORDER — IPRATROPIUM-ALBUTEROL 0.5-2.5 (3) MG/3ML IN SOLN
3.0000 mL | Freq: Once | RESPIRATORY_TRACT | Status: AC
  Administered 2018-02-07: 3 mL via RESPIRATORY_TRACT

## 2018-02-07 MED ORDER — IPRATROPIUM-ALBUTEROL 0.5-2.5 (3) MG/3ML IN SOLN
RESPIRATORY_TRACT | Status: AC
  Filled 2018-02-07: qty 3

## 2018-02-07 MED ORDER — HEPARIN SODIUM (PORCINE) 5000 UNIT/ML IJ SOLN
INTRAMUSCULAR | Status: AC
  Filled 2018-02-07: qty 2

## 2018-02-07 MED ORDER — DEXAMETHASONE SODIUM PHOSPHATE 10 MG/ML IJ SOLN
INTRAMUSCULAR | Status: AC
  Filled 2018-02-07: qty 1

## 2018-02-07 MED ORDER — LIDOCAINE HCL (PF) 1 % IJ SOLN
INTRAMUSCULAR | Status: AC
  Filled 2018-02-07: qty 5

## 2018-02-07 MED ORDER — DEXAMETHASONE SODIUM PHOSPHATE 10 MG/ML IJ SOLN
INTRAMUSCULAR | Status: DC | PRN
  Administered 2018-02-07: 8 mg via INTRAVENOUS

## 2018-02-07 MED ORDER — DIPHENHYDRAMINE HCL 50 MG/ML IJ SOLN
INTRAMUSCULAR | Status: AC
  Administered 2018-02-07: 12.5 mg via INTRAVENOUS
  Filled 2018-02-07: qty 1

## 2018-02-07 MED ORDER — EVICEL 2 ML EX KIT
PACK | CUTANEOUS | Status: AC
  Filled 2018-02-07: qty 1

## 2018-02-07 MED ORDER — PROPOFOL 10 MG/ML IV BOLUS
INTRAVENOUS | Status: AC
  Filled 2018-02-07: qty 20

## 2018-02-07 MED ORDER — HYDROCODONE-ACETAMINOPHEN 5-325 MG PO TABS
ORAL_TABLET | ORAL | Status: AC
  Filled 2018-02-07: qty 2

## 2018-02-07 MED ORDER — SODIUM CHLORIDE 0.9 % IV SOLN
INTRAVENOUS | Status: DC | PRN
  Administered 2018-02-07: 14:00:00 via INTRAMUSCULAR

## 2018-02-07 MED ORDER — PROPOFOL 10 MG/ML IV BOLUS
INTRAVENOUS | Status: DC | PRN
  Administered 2018-02-07: 180 mg via INTRAVENOUS

## 2018-02-07 MED ORDER — HEPARIN SODIUM (PORCINE) 1000 UNIT/ML IJ SOLN
INTRAMUSCULAR | Status: AC
  Filled 2018-02-07: qty 1

## 2018-02-07 MED ORDER — PHENYLEPHRINE HCL 10 MG/ML IJ SOLN
INTRAMUSCULAR | Status: DC | PRN
  Administered 2018-02-07: 100 ug via INTRAVENOUS
  Administered 2018-02-07: 200 ug via INTRAVENOUS

## 2018-02-07 MED ORDER — SODIUM CHLORIDE 0.9 % IV SOLN
INTRAVENOUS | Status: DC
  Administered 2018-02-07: 11:00:00 via INTRAVENOUS

## 2018-02-07 MED ORDER — DEXMEDETOMIDINE HCL 200 MCG/2ML IV SOLN
INTRAVENOUS | Status: DC | PRN
  Administered 2018-02-07: 12 ug via INTRAVENOUS
  Administered 2018-02-07: 8 ug via INTRAVENOUS
  Administered 2018-02-07: 12 ug via INTRAVENOUS

## 2018-02-07 MED ORDER — FENTANYL CITRATE (PF) 100 MCG/2ML IJ SOLN
INTRAMUSCULAR | Status: AC
  Filled 2018-02-07: qty 2

## 2018-02-07 MED ORDER — FENTANYL CITRATE (PF) 100 MCG/2ML IJ SOLN
25.0000 ug | INTRAMUSCULAR | Status: AC | PRN
  Administered 2018-02-07 (×6): 25 ug via INTRAVENOUS

## 2018-02-07 MED ORDER — HYDROCODONE-ACETAMINOPHEN 5-325 MG PO TABS: 1.0000 | ORAL_TABLET | Freq: Four times a day (QID) | ORAL | 0 refills | Status: AC | PRN

## 2018-02-07 MED ORDER — SODIUM CHLORIDE 0.9 % IV SOLN
INTRAVENOUS | Status: DC | PRN
  Administered 2018-02-07: 20 ug/min via INTRAVENOUS

## 2018-02-07 MED ORDER — EVICEL 2 ML EX KIT
PACK | CUTANEOUS | Status: DC | PRN
  Administered 2018-02-07: 2 mL via TOPICAL

## 2018-02-07 MED ORDER — FENTANYL CITRATE (PF) 100 MCG/2ML IJ SOLN
INTRAMUSCULAR | Status: DC | PRN
  Administered 2018-02-07 (×2): 50 ug via INTRAVENOUS

## 2018-02-07 MED ORDER — LIDOCAINE HCL (CARDIAC) PF 100 MG/5ML IV SOSY
PREFILLED_SYRINGE | INTRAVENOUS | Status: DC | PRN
  Administered 2018-02-07: 100 mg via INTRAVENOUS

## 2018-02-07 MED ORDER — FENTANYL CITRATE (PF) 100 MCG/2ML IJ SOLN
INTRAMUSCULAR | Status: AC
  Administered 2018-02-07: 25 ug via INTRAVENOUS
  Filled 2018-02-07: qty 2

## 2018-02-07 MED ORDER — GLYCOPYRROLATE 0.2 MG/ML IJ SOLN
INTRAMUSCULAR | Status: DC | PRN
  Administered 2018-02-07: 0.2 mg via INTRAVENOUS

## 2018-02-07 MED ORDER — VASOPRESSIN 20 UNIT/ML IV SOLN
INTRAVENOUS | Status: AC
  Filled 2018-02-07: qty 1

## 2018-02-07 MED ORDER — ONDANSETRON HCL 4 MG/2ML IJ SOLN
INTRAMUSCULAR | Status: DC | PRN
  Administered 2018-02-07: 4 mg via INTRAVENOUS

## 2018-02-07 SURGICAL SUPPLY — 61 items
BAG DECANTER FOR FLEXI CONT (MISCELLANEOUS) ×3 IMPLANT
BLADE SURG SZ11 CARB STEEL (BLADE) IMPLANT
BOOT SUTURE AID YELLOW STND (SUTURE) ×3 IMPLANT
BRUSH SCRUB EZ  4% CHG (MISCELLANEOUS) ×2
BRUSH SCRUB EZ 4% CHG (MISCELLANEOUS) ×1 IMPLANT
CANISTER SUCT 1200ML W/VALVE (MISCELLANEOUS) ×3 IMPLANT
CHLORAPREP W/TINT 26ML (MISCELLANEOUS) ×3 IMPLANT
CLIP SPRNG 6MM S-JAW DBL (CLIP) ×3
COVER PROBE FLX POLY STRL (MISCELLANEOUS) ×3 IMPLANT
COVER WAND RF STERILE (DRAPES) ×3 IMPLANT
DERMABOND ADVANCED (GAUZE/BANDAGES/DRESSINGS) ×2
DERMABOND ADVANCED .7 DNX12 (GAUZE/BANDAGES/DRESSINGS) ×1 IMPLANT
ELECT CAUTERY BLADE 6.4 (BLADE) ×3 IMPLANT
ELECT REM PT RETURN 9FT ADLT (ELECTROSURGICAL) ×3
ELECTRODE REM PT RTRN 9FT ADLT (ELECTROSURGICAL) ×1 IMPLANT
EVICEL AIRLESS SPRAY ACCES (MISCELLANEOUS) ×3 IMPLANT
GAUZE 4X4 16PLY RFD (DISPOSABLE) ×3 IMPLANT
GEL ULTRASOUND 20GR AQUASONIC (MISCELLANEOUS) IMPLANT
GLOVE BIO SURGEON STRL SZ7 (GLOVE) ×6 IMPLANT
GLOVE INDICATOR 7.5 STRL GRN (GLOVE) ×3 IMPLANT
GOWN STRL REUS W/ TWL LRG LVL3 (GOWN DISPOSABLE) ×1 IMPLANT
GOWN STRL REUS W/ TWL XL LVL3 (GOWN DISPOSABLE) ×2 IMPLANT
GOWN STRL REUS W/TWL LRG LVL3 (GOWN DISPOSABLE) ×2
GOWN STRL REUS W/TWL XL LVL3 (GOWN DISPOSABLE) ×4
GRAFT COLLAGEN VASCULAR 6X15 (Vascular Products) IMPLANT
GRAFT COLLAGEN VASCULAR 6X19 (Vascular Products) IMPLANT
GRAFT COLLAGEN VASCULAR 8X40 (Vascular Products) ×3 IMPLANT
HEMOSTAT SURGICEL 2X3 (HEMOSTASIS) ×6 IMPLANT
IV NS 500ML (IV SOLUTION) ×2
IV NS 500ML BAXH (IV SOLUTION) ×1 IMPLANT
KIT TURNOVER KIT A (KITS) ×3 IMPLANT
LABEL OR SOLS (LABEL) IMPLANT
LOOP RED MAXI  1X406MM (MISCELLANEOUS) ×2
LOOP VESSEL MAXI 1X406 RED (MISCELLANEOUS) ×1 IMPLANT
LOOP VESSEL MINI 0.8X406 BLUE (MISCELLANEOUS) ×1 IMPLANT
LOOPS BLUE MINI 0.8X406MM (MISCELLANEOUS) ×2
NDL SAFETY ECLIPSE 18X1.5 (NEEDLE) ×1 IMPLANT
NEEDLE FILTER BLUNT 18X 1/2SAF (NEEDLE) ×2
NEEDLE FILTER BLUNT 18X1 1/2 (NEEDLE) ×1 IMPLANT
NEEDLE HYPO 18GX1.5 SHARP (NEEDLE) ×2
NEEDLE HYPO 30X.5 LL (NEEDLE) IMPLANT
NS IRRIG 500ML POUR BTL (IV SOLUTION) ×3 IMPLANT
PACK EXTREMITY ARMC (MISCELLANEOUS) ×3 IMPLANT
PAD PREP 24X41 OB/GYN DISP (PERSONAL CARE ITEMS) ×3 IMPLANT
SOLUTION CELL SAVER (CLIP) ×1 IMPLANT
STOCKINETTE STRL 4IN 9604848 (GAUZE/BANDAGES/DRESSINGS) ×3 IMPLANT
SUT MNCRL AB 4-0 PS2 18 (SUTURE) ×3 IMPLANT
SUT PROLENE 6 0 BV (SUTURE) ×21 IMPLANT
SUT SILK 2 0 (SUTURE) ×2
SUT SILK 2 0 SH (SUTURE) ×6 IMPLANT
SUT SILK 2-0 18XBRD TIE 12 (SUTURE) ×1 IMPLANT
SUT SILK 3 0 (SUTURE) ×2
SUT SILK 3-0 18XBRD TIE 12 (SUTURE) ×1 IMPLANT
SUT SILK 4 0 (SUTURE) ×2
SUT SILK 4-0 18XBRD TIE 12 (SUTURE) ×1 IMPLANT
SUT VIC AB 3-0 SH 27 (SUTURE) ×4
SUT VIC AB 3-0 SH 27X BRD (SUTURE) ×2 IMPLANT
SYR 20CC LL (SYRINGE) ×3 IMPLANT
SYR 3ML LL SCALE MARK (SYRINGE) ×6 IMPLANT
SYR TB 1ML 27GX1/2 LL (SYRINGE) IMPLANT
TOWEL OR 17X26 4PK STRL BLUE (TOWEL DISPOSABLE) IMPLANT

## 2018-02-07 NOTE — Op Note (Signed)
[facility]   OPERATIVE NOTE   PROCEDURE: 1. Jump graft revision right radiocephalic arteriovenous fistula with 7 mm Artegraft taken from the forearm cephalic vein to the antecubital brachial vein 2. Ligation of right radiocephalic AV fistula  PRE-OPERATIVE DIAGNOSIS: 1. 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 ESRD  POST-OPERATIVE DIAGNOSIS: same as above   SURGEON: Leotis Pain, MD  ASSISTANT(S): Hezzie Bump, PA-C  ANESTHESIA: General  ESTIMATED BLOOD LOSS: 50 cc  FINDING(S): 1. 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  Antecubital cephalic vein was large but chronically occluded.  Brachial vein at the antecubital fossa appeared usable for outflow 2. palpable thrill at end of the case  SPECIMEN(S): None  INDICATIONS:   Patrick Downs is a 42 y.o. male who  presents with aneurysmal degeneration and occlusion of the venous outflow of his right radiocephalic arteriovenous access.  In order to salvage the fistula and decrease the bleeding complication risks, I recommended a jump graft revision with ligation of the access.  Risk, benefits, and alternatives to access surgery were discussed.  The patient is aware the risks include but are not limited to: bleeding, infection, steal syndrome, nerve damage, ischemic monomelic neuropathy, loss of the access, need for additional procedures, death and stroke.  The patient agrees to proceed forward with the procedure. An assistant was present during the procedure to help facilitate the exposure and expedite the procedure.  DESCRIPTION: After obtaining full informed written consent, the patient was brought back to the operating room and placed supine upon the operating table.  The patient received IV antibiotics prior to induction.  After obtaining adequate anesthesia, the patient was  prepped and draped in the standard fashion for the access procedure. The assistant provided retraction and mobilization to help facilitate exposure and expedite the procedure throughout the entire procedure.  This included following suture, using retractors, and optimizing lighting.  As incision was created near the arterial anastomosis prior to the aneurysmal segment.  The access was encircled with vessel loops and prepared for control.  I then created an incision in the proximal arm beyond the aneurysmal and occluded segment and evaluated the antecubital fossa and distal upper arm for venous outflow that would be usable for a jump graft.  Initially, the cephalic vein just above the antecubital fossa was large and I thought this may be usable outflow vein.  When this was opened however, it was fibrotic and would not provide outflow for the fistula.  I then turned my attention to the brachial vein at this location.  This was just below the level of the previous brachiobasilic fistula that could be seen.  Paired brachial veins were identified, and the larger of the 2 brachial veins was dissected free and prepared for use for venous outflow.  I then used the tunneller and tunnelled between the two incisions around the old access.  I brought a 7 mm Artegraft through the tunneller making sure to avoid twisting after marking for orientation.  The patient was then given 3000 units of intravenous heparin.  The access was then controlled and clamped and ligated distally with 2 silk suture ligatures to close the cephalic vein and avoid any backbleeding of the aneurysmal segment.  I prepared the end nearer the original arterial anastomosis for an anastomosis with the new artery graft.  The anastomosis was created in an end to end fashion with  two 6-0 Prolene sutures in the typical fashion.  I then flushed through the new graft and prepared this for the distal anastomosis.  The brachial vein was controlled with bulldog clamps.   A venotomy was then created in the brachial vein and extended with Potts scissors.  The graft was cut and beveled to an appropriate length to match the venotomy.  An anastomosis was then created with two 6-0 Prolene sutures in the usual fashion in an end-to-side fashion.  The graft was flushed and de-aired prior to release of control.  Patch sutures with 6-0 Prolene sutures were used as needed for control of bleeding.  Surgicel and Evicel topical hemostatic agents were placed and hemostasis was complete.  I then closed the wound with 3-0 Vicryl suture in the subcutaneous space and a 4-0 Monocryl suture was used to close the skin.  Dermabond was placed as a dressing.  The patient was then awakened from anesthesia and taken to the recovery room in stable condition having tolerated the procedure well.    COMPLICATIONS: none  CONDITION: stable  Leotis Pain  02/07/2018, 2:54 PM   This note was created with Dragon Medical transcription system. Any errors in dictation are purely unintentional.

## 2018-02-07 NOTE — Discharge Instructions (Signed)

## 2018-02-07 NOTE — H&P (Signed)
[facility] History & Physical Update  The patient was interviewed and re-examined.  The patient's previous History and Physical has been reviewed and is unchanged.  There is no change in the plan of care. We plan to proceed with the scheduled procedure.  Leotis Pain, MD  02/07/2018, 11:51 AM

## 2018-02-07 NOTE — Progress Notes (Signed)
Pt with 2 plus pulse in right arm , with good bruit and thrill, no complaints of pain , drinking juice, verbalizes understanding or dc instructions, wheeled to car, ambulatory

## 2018-02-07 NOTE — Anesthesia Procedure Notes (Signed)
Procedure Name: LMA Insertion Performed by: Lia Foyer, RN Pre-anesthesia Checklist: Patient identified, Patient being monitored, Timeout performed, Emergency Drugs available and Suction available Patient Re-evaluated:Patient Re-evaluated prior to induction Oxygen Delivery Method: Circle system utilized Preoxygenation: Pre-oxygenation with 100% oxygen Induction Type: IV induction Ventilation: Mask ventilation without difficulty LMA: LMA inserted LMA Size: 4.5 Tube type: Oral Number of attempts: 1 Placement Confirmation: positive ETCO2 and breath sounds checked- equal and bilateral Tube secured with: Tape Dental Injury: Teeth and Oropharynx as per pre-operative assessment

## 2018-02-07 NOTE — Anesthesia Post-op Follow-up Note (Signed)
Anesthesia QCDR form completed.        

## 2018-02-07 NOTE — Transfer of Care (Signed)
Immediate Anesthesia Transfer of Care Note  Patient: EBRAHIM DEREMER  Procedure(s) Performed: REVISON OF ARTERIOVENOUS FISTULA (Right )  Patient Location: PACU  Anesthesia Type:General  Level of Consciousness: awake  Airway & Oxygen Therapy: Patient Spontanous Breathing and Patient connected to face mask oxygen  Post-op Assessment: Report given to RN and Post -op Vital signs reviewed and stable  Post vital signs: Reviewed  Last Vitals:  Vitals Value Taken Time  BP 105/34 02/07/2018  3:26 PM  Temp 36.6 C 02/07/2018  3:26 PM  Pulse 94 02/07/2018  3:26 PM  Resp 29 02/07/2018  3:26 PM  SpO2 98 % 02/07/2018  3:26 PM  Vitals shown include unvalidated device data.  Last Pain:  Vitals:   02/07/18 1056  TempSrc: Oral  PainSc: 7       Patients Stated Pain Goal: 3 (63/78/58 8502)  Complications: No apparent anesthesia complications

## 2018-02-07 NOTE — Anesthesia Preprocedure Evaluation (Signed)
Anesthesia Evaluation  Patient identified by MRN, date of birth, ID band Patient awake    Reviewed: Allergy & Precautions, NPO status , Patient's Chart, lab work & pertinent test results  History of Anesthesia Complications Negative for: history of anesthetic complications  Airway Mallampati: II       Dental   Pulmonary neg sleep apnea, neg COPD,           Cardiovascular hypertension (hx, no problems now), (-) Past MI and (-) CHF (-) dysrhythmias (-) Valvular Problems/Murmurs     Neuro/Psych neg Seizures Depression    GI/Hepatic Neg liver ROS, neg GERD  ,  Endo/Other  neg diabetes  Renal/GU Dialysis and ESRFRenal disease     Musculoskeletal   Abdominal   Peds  Hematology   Anesthesia Other Findings   Reproductive/Obstetrics                             Anesthesia Physical Anesthesia Plan  ASA: IV  Anesthesia Plan: General   Post-op Pain Management:    Induction: Intravenous  PONV Risk Score and Plan: 2 and Dexamethasone and Ondansetron  Airway Management Planned: LMA  Additional Equipment:   Intra-op Plan:   Post-operative Plan:   Informed Consent: I have reviewed the patients History and Physical, chart, labs and discussed the procedure including the risks, benefits and alternatives for the proposed anesthesia with the patient or authorized representative who has indicated his/her understanding and acceptance.     Plan Discussed with:   Anesthesia Plan Comments:         Anesthesia Quick Evaluation

## 2018-02-08 ENCOUNTER — Encounter: Payer: Self-pay | Admitting: Vascular Surgery

## 2018-02-10 NOTE — Anesthesia Postprocedure Evaluation (Signed)
Anesthesia Post Note  Patient: Patrick Downs  Procedure(s) Performed: REVISON OF ARTERIOVENOUS FISTULA (Right )  Patient location during evaluation: PACU Anesthesia Type: General Level of consciousness: awake and alert Pain management: pain level controlled Vital Signs Assessment: post-procedure vital signs reviewed and stable Respiratory status: spontaneous breathing, nonlabored ventilation, respiratory function stable and patient connected to nasal cannula oxygen Cardiovascular status: blood pressure returned to baseline and stable Postop Assessment: no apparent nausea or vomiting Anesthetic complications: no     Last Vitals:  Vitals:   02/07/18 1650 02/07/18 1729  BP: (!) 123/51 (!) 124/55  Pulse: 92 92  Resp: 20 18  Temp: (!) 36.2 C (!) 36.1 C  SpO2: 96% 98%    Last Pain:  Vitals:   02/07/18 1729  TempSrc: Temporal  PainSc:                  Durenda Hurt

## 2018-02-20 ENCOUNTER — Telehealth (INDEPENDENT_AMBULATORY_CARE_PROVIDER_SITE_OTHER): Payer: Self-pay

## 2018-02-20 NOTE — Telephone Encounter (Signed)
I left a message on pharmacy voicemail

## 2018-02-25 ENCOUNTER — Encounter: Payer: Medicare Other | Admitting: Anesthesiology

## 2018-02-27 ENCOUNTER — Other Ambulatory Visit: Payer: Self-pay

## 2018-02-27 ENCOUNTER — Encounter: Payer: Self-pay | Admitting: Anesthesiology

## 2018-02-27 ENCOUNTER — Ambulatory Visit: Payer: Medicare Other | Attending: Anesthesiology | Admitting: Anesthesiology

## 2018-02-27 VITALS — BP 114/83 | HR 89 | Temp 98.8°F | Resp 16 | Ht 74.0 in | Wt 255.0 lb

## 2018-02-27 DIAGNOSIS — G610 Guillain-Barre syndrome: Secondary | ICD-10-CM | POA: Diagnosis not present

## 2018-02-27 DIAGNOSIS — G894 Chronic pain syndrome: Secondary | ICD-10-CM

## 2018-02-27 DIAGNOSIS — R29898 Other symptoms and signs involving the musculoskeletal system: Secondary | ICD-10-CM

## 2018-02-27 DIAGNOSIS — N186 End stage renal disease: Secondary | ICD-10-CM | POA: Diagnosis not present

## 2018-02-27 DIAGNOSIS — Z760 Encounter for issue of repeat prescription: Secondary | ICD-10-CM | POA: Diagnosis present

## 2018-02-27 DIAGNOSIS — M47816 Spondylosis without myelopathy or radiculopathy, lumbar region: Secondary | ICD-10-CM

## 2018-02-27 DIAGNOSIS — M5136 Other intervertebral disc degeneration, lumbar region: Secondary | ICD-10-CM

## 2018-02-27 DIAGNOSIS — R531 Weakness: Secondary | ICD-10-CM | POA: Diagnosis not present

## 2018-02-27 DIAGNOSIS — Z79899 Other long term (current) drug therapy: Secondary | ICD-10-CM | POA: Insufficient documentation

## 2018-02-27 DIAGNOSIS — F1190 Opioid use, unspecified, uncomplicated: Secondary | ICD-10-CM

## 2018-02-27 DIAGNOSIS — Z79891 Long term (current) use of opiate analgesic: Secondary | ICD-10-CM | POA: Insufficient documentation

## 2018-02-27 DIAGNOSIS — Z992 Dependence on renal dialysis: Secondary | ICD-10-CM | POA: Diagnosis not present

## 2018-02-27 DIAGNOSIS — M545 Low back pain: Secondary | ICD-10-CM | POA: Diagnosis not present

## 2018-02-27 DIAGNOSIS — G8929 Other chronic pain: Secondary | ICD-10-CM

## 2018-02-27 MED ORDER — OXYCODONE-ACETAMINOPHEN 5-325 MG PO TABS: 1.0000 | ORAL_TABLET | Freq: Four times a day (QID) | ORAL | 0 refills | Status: DC | PRN

## 2018-02-27 NOTE — Patient Instructions (Signed)
Electronic prescription to last until 03/29/2018.   Medication Rules  Applies to: All patients receiving prescriptions (written or electronic).  Pharmacy of record: Pharmacy where electronic prescriptions will be sent. If written prescriptions are taken to a different pharmacy, please inform the nursing staff. The pharmacy listed in the electronic medical record should be the one where you would like electronic prescriptions to be sent.  Prescription refills: Only during scheduled appointments. Applies to both, written and electronic prescriptions.  NOTE: The following applies primarily to controlled substances (Opioid* Pain Medications).   Patient's responsibilities: 1. Pain Pills: Bring all pain pills to every appointment (except for procedure appointments). 2. Pill Bottles: Bring pills in original pharmacy bottle. Always bring newest bottle. Bring bottle, even if empty. 3. Medication refills: You are responsible for knowing and keeping track of what medications you need refilled. The day before your appointment, write a list of all prescriptions that need to be refilled. Bring that list to your appointment and give it to the admitting nurse. Prescriptions will be written only during appointments. If you forget a medication, it will not be "Called in", "Faxed", or "electronically sent". You will need to get another appointment to get these prescribed. 4. Prescription Accuracy: You are responsible for carefully inspecting your prescriptions before leaving our office. Have the discharge nurse carefully go over each prescription with you, before taking them home. Make sure that your name is accurately spelled, that your address is correct. Check the name and dose of your medication to make sure it is accurate. Check the number of pills, and the written instructions to make sure they are clear and accurate. Make sure that you are given enough medication to last until your next medication refill  appointment. 5. Taking Medication: Take medication as prescribed. Never take more pills than instructed. Never take medication more frequently than prescribed. Taking less pills or less frequently is permitted and encouraged, when it comes to controlled substances (written prescriptions).  6. Inform other Doctors: Always inform, all of your healthcare providers, of all the medications you take. 7. Pain Medication from other Providers: You are not allowed to accept any additional pain medication from any other Doctor or Healthcare provider. There are two exceptions to this rule. (see below) In the event that you require additional pain medication, you are responsible for notifying us, as stated below. 8. Medication Agreement: You are responsible for carefully reading and following our Medication Agreement. This must be signed before receiving any prescriptions from our practice. Safely store a copy of your signed Agreement. Violations to the Agreement will result in no further prescriptions. (Additional copies of our Medication Agreement are available upon request.) 9. Laws, Rules, & Regulations: All patients are expected to follow all Federal and [employer], [employer], Rules, [employer]. Ignorance of the Laws does not constitute a valid excuse. The use of any illegal substances is prohibited. 10. Adopted CDC guidelines & recommendations: Target dosing levels will be at or below 60 MME/day. Use of benzodiazepines** is not recommended.  Exceptions: There are only two exceptions to the rule of not receiving pain medications from other Healthcare Providers. 1. Exception #1 (Emergencies): In the event of an emergency (i.e.: accident requiring emergency care), you are allowed to receive additional pain medication. However, you are responsible for: As soon as you are able, call our office [phone], at any time of the day or night, and leave a message stating your name, the date and nature of the emergency,  and the  name and dose of the medication prescribed. In the event that your call is answered by a member of our staff, make sure to document and save the date, time, and the name of the person that took your information.  2. Exception #2 (Planned Surgery): In the event that you are scheduled by another doctor or dentist to have any type of surgery or procedure, you are allowed (for a period no longer than 30 days), to receive additional pain medication, for the acute post-op pain. However, in this case, you are responsible for picking up a copy of our "Post-op Pain Management for Surgeons" handout, and giving it to your surgeon or dentist. This document is available at our office, and does not require an appointment to obtain it. Simply go to our office during business hours (Monday-Thursday from 8:00 AM to 4:00 PM) (Friday 8:00 AM to 12:00 Noon) or if you have a scheduled appointment with [place], prior to your surgery, and ask for it by name. In addition, you will need to provide [place] with your name, name of your surgeon, type of surgery, and date of procedure or surgery.  *Opioid medications include: morphine, codeine, oxycodone, oxymorphone, hydrocodone, hydromorphone, meperidine, tramadol, tapentadol, buprenorphine, fentanyl, methadone. **Benzodiazepine medications include: diazepam (Valium), alprazolam (Xanax), clonazepam (Klonopine), lorazepam (Ativan), clorazepate (Tranxene), chlordiazepoxide (Librium), estazolam (Prosom), oxazepam (Serax), temazepam (Restoril), triazolam (Halcion) (Last updated: 06/21/2017)

## 2018-02-27 NOTE — Progress Notes (Signed)
Nursing Pain Medication Assessment:   Safety precautions to be maintained throughout the outpatient stay will include: orient to surroundings, keep bed in low position, maintain call bell within reach at all times, provide assistance with transfer out of bed and ambulation.  Medication Inspection Compliance: Pill count conducted under aseptic conditions, in front of the patient. Neither the pills nor the bottle was removed from the patient's sight at any time. Once count was completed pills were immediately returned to the patient in their original bottle.  Medication: Oxycodone/APAP Pill/Patch Count: 0 of 120 pills remain Pill/Patch Appearance: Markings consistent with prescribed medication Bottle Appearance: Standard pharmacy container. Clearly labeled. Filled Date: 48 / 08 / 2019 Last Medication intake:  Yesterday

## 2018-02-28 NOTE — Progress Notes (Signed)
Subjective:  Patient ID: Patrick Downs, male    DOB: 1976/02/19  Age: 42 y.o. MRN: [id number]  CC: Medication Refill (Oxycodone )   Procedure: None  HPI [name] presents for reevaluation.  He was last seen a month ago and continues to do well with his current regimen including the oxycodone 5 mg tablets taking these 4 times a day.  He denies any side effects or problems with the medication.  I have reviewed his narcotic assessment sheet and he is getting good relief of his low back pain lower extremity pain and shoulder pain with the medications.  He has been on this regimen in the past and continues to do well with it at present.  Otherwise the quality characteristic and distribution of his pain have been stable in nature.  In the past he has had a positive serum drug screen for marijuana and at this point he is denying any recurrent use.  Outpatient Medications Prior to Visit  Medication Sig Dispense Refill  . calcium acetate (PHOSLO) 667 MG capsule Take 3 capsules (2,001 mg total) by mouth 3 (three) times daily with meals. (Patient taking differently: See admin instructions. Take 3 capsules (2001MG ) by mouth 3 times daily with meals and 2 capsules (1334MG ) by mouth daily with snacks) 1 capsule 0  . gabapentin (NEURONTIN) 300 MG capsule Take 600 mg by mouth at bedtime.     . midodrine (PROAMATINE) 10 MG tablet Take 1 tablet (10 mg total) by mouth 3 (three) times daily with meals. 90 tablet 0  . sildenafil (REVATIO) 20 MG tablet Take 20-100 mg by mouth as needed (for ED).     Marland Kitchen oxyCODONE-acetaminophen (PERCOCET/ROXICET) 5-325 MG tablet Take 1 tablet by mouth every 6 (six) hours as needed for severe pain. 120 tablet 0   No facility-administered medications prior to visit.     Review of Systems CNS: No confusion or sedation Cardiac: No angina or palpitations GI: No abdominal pain or constipation Constitutional: No nausea vomiting fevers or chills  Objective:  BP 114/83    Pulse 89   Temp 98.8 F (37.1 C)   Resp 16   Ht 6\' 2"  (1.88 m)   Wt 255 lb (115.7 kg)   SpO2 100%   BMI 32.74 kg/m    BP Readings from Last 3 Encounters:  02/27/18 114/83  02/07/18 (!) 124/55  02/06/18 (!) 117/51     Wt Readings from Last 3 Encounters:  02/27/18 255 lb (115.7 kg)  02/07/18 263 lb 4.8 oz (119.4 kg)  02/06/18 263 lb (119.3 kg)     Physical Exam Pt is alert and oriented PERRL EOMI HEART IS RRR no murmur or rub LCTA no wheezing or rales   Labs  Lab Results  Component Value Date   HGBA1C 5.5 08/16/2017   Lab Results  Component Value Date   CREATININE 13.96 (H) 02/06/2018    -------------------------------------------------------------------------------------------------------------------- Lab Results  Component Value Date   WBC 6.3 02/06/2018   HGB 10.5 (L) 02/07/2018   HCT 31.0 (L) 02/07/2018   PLT 169 02/06/2018   GLUCOSE 88 02/07/2018   TRIG 673 (H) 08/21/2014   ALT 27 01/19/2018   AST 28 01/19/2018   NA 138 02/07/2018   K 4.0 02/07/2018   CL 101 02/06/2018   CREATININE 13.96 (H) 02/06/2018   BUN 60 (H) 02/06/2018   CO2 23 02/06/2018   TSH 5.560 (H) 08/27/2014   INR 1.13 02/06/2018   HGBA1C 5.5 08/16/2017    ---------------------------------------------------------------------------------------------------------------------  No results found.   Assessment & Plan:   Onofre was seen today for medication refill.  Diagnoses and all orders for this visit:  DDD (degenerative disc disease), lumbar  Chronic bilateral low back pain without sciatica  Facet arthritis of lumbar region  Weakness of both lower extremities  Chronic, continuous use of opioids -     Drug Screen 10 W/Conf, Serum  Chronic pain syndrome -     Drug Screen 10 W/Conf, Serum  ESRD on dialysis ([place])  Guillain-Barre syndrome ([place])  Other orders -     oxyCODONE-acetaminophen (PERCOCET/ROXICET) 5-325 MG tablet; Take 1 tablet by mouth every 6 (six) hours  as needed for severe pain.        ----------------------------------------------------------------------------------------------------------------------  Problem List Items Addressed This Visit      Unprioritized   ESRD on dialysis [facility])    Other Visit Diagnoses    DDD (degenerative disc disease), lumbar    -  Primary   Relevant Medications   oxyCODONE-acetaminophen (PERCOCET/ROXICET) 5-325 MG tablet   Chronic bilateral low back pain without sciatica       Relevant Medications   oxyCODONE-acetaminophen (PERCOCET/ROXICET) 5-325 MG tablet   Facet arthritis of lumbar region       Relevant Medications   oxyCODONE-acetaminophen (PERCOCET/ROXICET) 5-325 MG tablet   Weakness of both lower extremities       Chronic, continuous use of opioids       Relevant Orders   Drug Screen 10 W/Conf, Serum   Chronic pain syndrome       Relevant Orders   Drug Screen 10 W/Conf, Serum   Guillain-Barre syndrome ([place])            ----------------------------------------------------------------------------------------------------------------------  1. DDD (degenerative disc disease), lumbar Continue with core stretching strengthening exercises as tolerated  2. Chronic bilateral low back pain without sciatica We will avoid any interventional therapy as he is concerned about this because of the previous spinal during his history of Guyon Barr  3. Facet arthritis of lumbar region Continue with stretching and strengthening as above  4. Weakness of both lower extremities To new follow-up with his primary care physicians  5. Chronic, continuous use of opioids We will refill his medications for November 7 for Percocet.  Is scheduled for return to clinic in 1 month - Drug Screen 10 W/Conf, Serum  6. Chronic pain syndrome Serum drug screen is requested today. - Drug Screen 10 W/Conf, Serum  7. ESRD on dialysis [facility]) 10 you follow-up with nephrology  8. Guillain-Barre syndrome  ([place])     ----------------------------------------------------------------------------------------------------------------------  I am having [name] maintain his calcium acetate, gabapentin, sildenafil, midodrine, and oxyCODONE-acetaminophen.   Meds ordered this encounter  Medications  . oxyCODONE-acetaminophen (PERCOCET/ROXICET) 5-325 MG tablet    Sig: Take 1 tablet by mouth every 6 (six) hours as needed for severe pain.    Dispense:  120 tablet    Refill:  0    Do not fill until [id number]   Patient's Medications  New Prescriptions   No medications on file  Previous Medications   CALCIUM ACETATE (PHOSLO) 667 MG CAPSULE    Take 3 capsules (2,001 mg total) by mouth 3 (three) times daily with meals.   GABAPENTIN (NEURONTIN) 300 MG CAPSULE    Take 600 mg by mouth at bedtime.    MIDODRINE (PROAMATINE) 10 MG TABLET    Take 1 tablet (10 mg total) by mouth 3 (three) times daily with meals.   SILDENAFIL (REVATIO) 20 MG TABLET  Take 20-100 mg by mouth as needed (for ED).   Modified Medications   Modified Medication Previous Medication   OXYCODONE-ACETAMINOPHEN (PERCOCET/ROXICET) 5-325 MG TABLET oxyCODONE-acetaminophen (PERCOCET/ROXICET) 5-325 MG tablet      Take 1 tablet by mouth every 6 (six) hours as needed for severe pain.    Take 1 tablet by mouth every 6 (six) hours as needed for severe pain.  Discontinued Medications   No medications on file   ----------------------------------------------------------------------------------------------------------------------  Follow-up: Return in about 1 month (around 03/29/2018) for evaluation, med refill.    Molli Barrows, MD

## 2018-03-01 ENCOUNTER — Ambulatory Visit (INDEPENDENT_AMBULATORY_CARE_PROVIDER_SITE_OTHER): Payer: Medicare Other | Admitting: Nurse Practitioner

## 2018-03-01 ENCOUNTER — Encounter (INDEPENDENT_AMBULATORY_CARE_PROVIDER_SITE_OTHER): Payer: Self-pay | Admitting: Nurse Practitioner

## 2018-03-01 VITALS — BP 138/85 | HR 77 | Resp 16 | Ht 74.0 in | Wt 266.0 lb

## 2018-03-01 DIAGNOSIS — K219 Gastro-esophageal reflux disease without esophagitis: Secondary | ICD-10-CM

## 2018-03-01 DIAGNOSIS — N186 End stage renal disease: Secondary | ICD-10-CM

## 2018-03-01 DIAGNOSIS — Z992 Dependence on renal dialysis: Secondary | ICD-10-CM

## 2018-03-01 DIAGNOSIS — G629 Polyneuropathy, unspecified: Secondary | ICD-10-CM

## 2018-03-01 NOTE — Progress Notes (Signed)
Subjective:    Patient ID: Patrick Downs, male    DOB: 04/21/1976, 42 y.o.   MRN: [id number] Chief Complaint  Patient presents with  . Follow-up    [facility] 3week     HPI  Patrick Downs is a 42 y.o. male that is following up after placement of a jump graft revision of the right radiocephalic AV fistula with a 7 mm Artegraft.  He denies any major issues with pain following the surgery.  The surgical site appears well approximated and well-healed.  He denies any pain of the hand or fingertips.  The patient does endorse a history of peripheral neuropathy and has decreased sensation in his fingertips.  The patient is continuing to dialyze via right PermCath.  The patient denies any fever, chills, nausea, vomiting, diarrhea.  Patient denies any chest pain or shortness of breath.  Past Medical History:  Diagnosis Date  . Depression   . Dialysis patient ([place])   . ESRD (end stage renal disease) on dialysis ([place])   . Failure to thrive in adult   . Gout   . Guillain Barr syndrome ([place])   . HTN (hypertension)   . Kidney failure   . Pneumonia   . Renal insufficiency   . Respiratory failure [facility])     Past Surgical History:  Procedure Laterality Date  . A/V FISTULAGRAM Right 01/07/2018   Procedure: A/V FISTULAGRAM;  Surgeon: Algernon Huxley, MD;  Location: [facility];  Service: Cardiovascular;  Laterality: Right;  . AMPUTATION TOE Right 06/08/2017   Procedure: AMPUTATION TOE RIGHT FIFTH TOE;  Surgeon: Samara Deist, DPM;  Location: [facility] ORS;  Service: Podiatry;  Laterality: Right;  . AV FISTULA PLACEMENT     x5      2 graphs  . PARATHYROIDECTOMY    . RENAL BIOPSY    . REVISON OF ARTERIOVENOUS FISTULA Right 02/07/2018   Procedure: REVISON OF ARTERIOVENOUS FISTULA;  Surgeon: Algernon Huxley, MD;  Location: [facility] ORS;  Service: Vascular;  Laterality: Right;  . TEE WITHOUT CARDIOVERSION N/A 01/22/2018   Procedure: TRANSESOPHAGEAL ECHOCARDIOGRAM (TEE);  Surgeon: Dionisio David, MD;   Location: [facility] ORS;  Service: Cardiovascular;  Laterality: N/A;  . tonsiilectomy    . tracheotomy      Social History   Socioeconomic History  . Marital status: Divorced    Spouse name: Not on file  . Number of children: Not on file  . Years of education: Not on file  . Highest education level: Not on file  Occupational History  . Not on file  Social Needs  . Financial resource strain: Not on file  . Food insecurity:    Worry: Not on file    Inability: Not on file  . Transportation needs:    Medical: Not on file    Non-medical: Not on file  Tobacco Use  . Smoking status: Never Smoker  . Smokeless tobacco: Never Used  Substance and Sexual Activity  . Alcohol use: No  . Drug use: Yes    Types: Marijuana  . Sexual activity: Not on file    Comment: did not ask, mother present  Lifestyle  . Physical activity:    Days per week: Not on file    Minutes per session: Not on file  . Stress: Not on file  Relationships  . Social connections:    Talks on phone: Not on file    Gets together: Not on file    Attends religious service: Not  on file    Active member of club or organization: Not on file    Attends meetings of clubs or organizations: Not on file    Relationship status: Not on file  . Intimate partner violence:    Fear of current or ex partner: Not on file    Emotionally abused: Not on file    Physically abused: Not on file    Forced sexual activity: Not on file  Other Topics Concern  . Not on file  Social History Narrative  . Not on file    Family History  Problem Relation Age of Onset  . Diabetes Mellitus II Father   . Kidney disease Father   . Kidney failure Paternal Grandfather   . Prostate cancer Neg Hx   . Kidney cancer Neg Hx   . Bladder Cancer Neg Hx     Allergies  Allergen Reactions  . Ivp Dye [Iodinated Diagnostic Agents] Other (See Comments)    Reaction unknown  . Ondansetron Other (See Comments)    Stomach pain   . Minoxidil Other (See  Comments)    "put fluid around my heart", PERICARDIAL EFFUSION  . Morphine Other (See Comments)    Makes patient aggressive  . Morphine And Related Other (See Comments)    Reaction unknown  . Omnipaque [Iohexol] Itching and Other (See Comments)    Rigors on one occasion, widespread itching on a separate occasion (resolved with Benadryl), tremors     Review of Systems   Review of Systems: Negative Unless Checked Constitutional: [] Weight loss  [] Fever  [] Chills Cardiac: [] Chest pain   []  Atrial Fibrillation  [] Palpitations   [] Shortness of breath when laying flat   [] Shortness of breath with exertion. Vascular:  [] Pain in legs with walking   [] Pain in legs with standing  [] History of DVT   [] Phlebitis   [] Swelling in legs   [] Varicose veins   [] Non-healing ulcers Pulmonary:   [] Uses home oxygen   [] Productive cough   [] Hemoptysis   [] Wheeze  [] COPD   [] Asthma Neurologic:  [] Dizziness   [] Seizures   [] History of stroke   [] History of TIA  [] Aphasia   [] Vissual changes   [x] Weakness or numbness in arm   [] Weakness or numbness in leg Musculoskeletal:   [] Joint swelling   [] Joint pain   [] Low back pain  []  History of Knee Replacement Hematologic:  [] Easy bruising  [] Easy bleeding   [] Hypercoagulable state   [] Anemic Gastrointestinal:  [] Diarrhea   [] Vomiting  [] Gastroesophageal reflux/heartburn   [] Difficulty swallowing. Genitourinary:  [x] Chronic kidney disease   [] Difficult urination  [] Anuric   [] Blood in urine Skin:  [] Rashes   [] Ulcers  Psychological:  [] History of anxiety   []  History of major depression  []  Memory Difficulties     Objective:   Physical Exam  BP 138/85 (BP Location: Left Arm)   Pulse 77   Resp 16   Ht 6\' 2"  (1.88 m)   Wt 266 lb (120.7 kg)   BMI 34.15 kg/m   Gen: WD/WN, NAD Head: [place]/AT, No temporalis wasting.  Ear/Nose/Throat: Hearing grossly intact, nares w/o erythema or drainage Eyes: PER, EOMI, sclera nonicteric.  Neck: Supple, no masses.  No JVD.    Pulmonary:  Good air movement, no use of accessory muscles.  Cardiac: RRR Vascular:  Minimal swelling in right forearm, graft impression visible Vessel Right Left  Radial Palpable Palpable   Gastrointestinal: soft, non-distended. No guarding/no peritoneal signs.  Musculoskeletal: M/S 5/5 throughout.  No deformity or atrophy.  Neurologic: Pain and light touch intact in extremities.  Symmetrical.  Speech is fluent. Motor exam as listed above. Psychiatric: Judgment intact, Mood & affect appropriate for pt's clinical situation. Dermatologic: No Venous rashes. No Ulcers Noted.  No changes consistent with cellulitis. Lymph : No Cervical lymphadenopathy, no lichenification or skin changes of chronic lymphedema.      Assessment & Plan:   1. ESRD on dialysis [facility]) Patient's access poor thrill and soft bruit.  We will have the patient return in 1 to 2 weeks for an HDA to determine suitability for use. - VAS US DUPLEX DIALYSIS ACCESS (AVF, AVG); Future  2. Gastroesophageal reflux disease, esophagitis presence not specified Continue PPI as already ordered, this medication has been reviewed and there are no changes at this time.  Avoidence of caffeine and alcohol  Moderate elevation of the head of the bed   3. Polyneuropathy, unspecified Patient has a history of neuropathy on his fingertips.  Currently under control and managed by his primary team.   Current Outpatient Medications on File Prior to Visit  Medication Sig Dispense Refill  . calcium acetate (PHOSLO) 667 MG capsule Take 3 capsules (2,001 mg total) by mouth 3 (three) times daily with meals. (Patient taking differently: See admin instructions. Take 3 capsules (2001MG ) by mouth 3 times daily with meals and 2 capsules (1334MG ) by mouth daily with snacks) 1 capsule 0  . gabapentin (NEURONTIN) 300 MG capsule Take 600 mg by mouth at bedtime.     . midodrine (PROAMATINE) 10 MG tablet Take 1 tablet (10 mg total) by mouth 3 (three) times  daily with meals. 90 tablet 0  . oxyCODONE-acetaminophen (PERCOCET/ROXICET) 5-325 MG tablet Take 1 tablet by mouth every 6 (six) hours as needed for severe pain. 120 tablet 0  . sildenafil (REVATIO) 20 MG tablet Take 20-100 mg by mouth as needed (for ED).      No current facility-administered medications on file prior to visit.     There are no Patient Instructions on file for this visit. No follow-ups on file.   Kris Hartmann, NP  This note was completed with Sales executive.  Any errors are purely unintentional.

## 2018-03-07 LAB — DRUG SCREEN 10 W/CONF, SERUM
Amphetamines, IA: NEGATIVE ng/mL
BARBITURATES, IA: NEGATIVE ug/mL
Benzodiazepines, IA: NEGATIVE ng/mL
Cocaine & Metabolite, IA: NEGATIVE ng/mL
METHADONE, IA: NEGATIVE ng/mL
Opiates, IA: NEGATIVE ng/mL
Oxycodones, IA: NEGATIVE ng/mL
PHENCYCLIDINE, IA: NEGATIVE ng/mL
PROPOXYPHENE, IA: NEGATIVE ng/mL
THC(MARIJUANA) METABOLITE, IA: NEGATIVE ng/mL

## 2018-03-07 LAB — OXYCODONES,MS,WB/SP RFX
OXYCODONES CONFIRMATION: NEGATIVE
OXYMORPHONE: NEGATIVE ng/mL
Oxycocone: NEGATIVE ng/mL

## 2018-03-20 ENCOUNTER — Ambulatory Visit (INDEPENDENT_AMBULATORY_CARE_PROVIDER_SITE_OTHER): Payer: Medicare Other | Admitting: Nurse Practitioner

## 2018-03-20 ENCOUNTER — Encounter (INDEPENDENT_AMBULATORY_CARE_PROVIDER_SITE_OTHER): Payer: Medicare Other

## 2018-03-26 ENCOUNTER — Encounter: Payer: Self-pay | Admitting: Anesthesiology

## 2018-03-26 ENCOUNTER — Ambulatory Visit: Payer: Medicare Other | Attending: Anesthesiology | Admitting: Anesthesiology

## 2018-03-26 ENCOUNTER — Other Ambulatory Visit: Payer: Self-pay

## 2018-03-26 VITALS — BP 148/100 | HR 103 | Temp 98.7°F | Resp 18 | Ht 74.0 in | Wt 250.0 lb

## 2018-03-26 DIAGNOSIS — G610 Guillain-Barre syndrome: Secondary | ICD-10-CM | POA: Diagnosis not present

## 2018-03-26 DIAGNOSIS — F1190 Opioid use, unspecified, uncomplicated: Secondary | ICD-10-CM

## 2018-03-26 DIAGNOSIS — N186 End stage renal disease: Secondary | ICD-10-CM | POA: Insufficient documentation

## 2018-03-26 DIAGNOSIS — M47816 Spondylosis without myelopathy or radiculopathy, lumbar region: Secondary | ICD-10-CM | POA: Diagnosis not present

## 2018-03-26 DIAGNOSIS — Z79891 Long term (current) use of opiate analgesic: Secondary | ICD-10-CM | POA: Diagnosis not present

## 2018-03-26 DIAGNOSIS — R531 Weakness: Secondary | ICD-10-CM | POA: Diagnosis not present

## 2018-03-26 DIAGNOSIS — Z760 Encounter for issue of repeat prescription: Secondary | ICD-10-CM | POA: Diagnosis present

## 2018-03-26 DIAGNOSIS — G894 Chronic pain syndrome: Secondary | ICD-10-CM | POA: Diagnosis not present

## 2018-03-26 DIAGNOSIS — Z79899 Other long term (current) drug therapy: Secondary | ICD-10-CM | POA: Insufficient documentation

## 2018-03-26 DIAGNOSIS — R29898 Other symptoms and signs involving the musculoskeletal system: Secondary | ICD-10-CM

## 2018-03-26 DIAGNOSIS — Z992 Dependence on renal dialysis: Secondary | ICD-10-CM | POA: Insufficient documentation

## 2018-03-26 DIAGNOSIS — M5136 Other intervertebral disc degeneration, lumbar region: Secondary | ICD-10-CM | POA: Diagnosis not present

## 2018-03-26 DIAGNOSIS — M545 Low back pain, unspecified: Secondary | ICD-10-CM

## 2018-03-26 DIAGNOSIS — G8929 Other chronic pain: Secondary | ICD-10-CM

## 2018-03-26 MED ORDER — OXYCODONE-ACETAMINOPHEN 5-325 MG PO TABS: 1.0000 | ORAL_TABLET | Freq: Four times a day (QID) | ORAL | 0 refills | Status: AC | PRN

## 2018-03-26 NOTE — Progress Notes (Signed)
Nursing Pain Medication Assessment:  Safety precautions to be maintained throughout the outpatient stay will include: orient to surroundings, keep bed in low position, maintain call bell within reach at all times, provide assistance with transfer out of bed and ambulation.  Medication Inspection Compliance: Patrick Downs did not comply with our request to bring his pills to be counted. He was reminded that bringing the medication bottles, even when empty, is a requirement.  Medication: None brought in. Pill/Patch Count: None available to be counted. Bottle Appearance: No container available. Did not bring bottle(s) to appointment. Filled Date: N/A Last Medication intake:  Yesterday
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
## 2018-03-26 NOTE — Patient Instructions (Addendum)
You have been given prescription for oxyCODONE-acetaminophen (PERCOCET/ROXICET) 5-325 MG tablet for one month to last 04/25/2018.

## 2018-04-08 ENCOUNTER — Ambulatory Visit (INDEPENDENT_AMBULATORY_CARE_PROVIDER_SITE_OTHER): Payer: Medicare Other | Admitting: Nurse Practitioner

## 2018-04-08 ENCOUNTER — Encounter (INDEPENDENT_AMBULATORY_CARE_PROVIDER_SITE_OTHER): Payer: Medicare Other

## 2018-04-29 ENCOUNTER — Other Ambulatory Visit: Payer: Self-pay

## 2018-04-29 ENCOUNTER — Ambulatory Visit: Payer: Medicare Other | Attending: Anesthesiology | Admitting: Anesthesiology

## 2018-04-29 ENCOUNTER — Encounter: Payer: Self-pay | Admitting: Anesthesiology

## 2018-04-29 VITALS — BP 136/93 | HR 87 | Temp 97.8°F | Resp 16 | Ht 74.0 in | Wt 250.0 lb

## 2018-04-29 DIAGNOSIS — R29898 Other symptoms and signs involving the musculoskeletal system: Secondary | ICD-10-CM | POA: Diagnosis present

## 2018-04-29 DIAGNOSIS — N186 End stage renal disease: Secondary | ICD-10-CM | POA: Insufficient documentation

## 2018-04-29 DIAGNOSIS — G894 Chronic pain syndrome: Secondary | ICD-10-CM | POA: Insufficient documentation

## 2018-04-29 DIAGNOSIS — G610 Guillain-Barre syndrome: Secondary | ICD-10-CM | POA: Insufficient documentation

## 2018-04-29 DIAGNOSIS — M47816 Spondylosis without myelopathy or radiculopathy, lumbar region: Secondary | ICD-10-CM | POA: Diagnosis present

## 2018-04-29 DIAGNOSIS — M545 Low back pain, unspecified: Secondary | ICD-10-CM

## 2018-04-29 DIAGNOSIS — F1190 Opioid use, unspecified, uncomplicated: Secondary | ICD-10-CM | POA: Diagnosis present

## 2018-04-29 DIAGNOSIS — M5136 Other intervertebral disc degeneration, lumbar region: Secondary | ICD-10-CM | POA: Diagnosis present

## 2018-04-29 DIAGNOSIS — Z992 Dependence on renal dialysis: Secondary | ICD-10-CM | POA: Diagnosis present

## 2018-04-29 DIAGNOSIS — G8929 Other chronic pain: Secondary | ICD-10-CM | POA: Diagnosis present

## 2018-04-29 MED ORDER — OXYCODONE-ACETAMINOPHEN 5-325 MG PO TABS: 1.0000 | ORAL_TABLET | Freq: Four times a day (QID) | ORAL | 0 refills | Status: DC | PRN

## 2018-04-29 NOTE — Patient Instructions (Signed)
Oxycodone escribed x2 to last until 06-28-2018

## 2018-04-29 NOTE — Progress Notes (Signed)
Nursing Pain Medication Assessment:  Safety precautions to be maintained throughout the outpatient stay will include: orient to surroundings, keep bed in low position, maintain call bell within reach at all times, provide assistance with transfer out of bed and ambulation.  Medication Inspection Compliance: Pill count conducted under aseptic conditions, in front of the patient. Neither the pills nor the bottle was removed from the patient's sight at any time. Once count was completed pills were immediately returned to the patient in their original bottle.  Medication: Oxycodone/APAP Pill/Patch Count: 0 of 120 pills remain Pill/Patch Appearance: Markings consistent with prescribed medication Bottle Appearance: Standard pharmacy container. Clearly labeled. Filled Date: 12/04 / 2019 Last Medication intake:  Today

## 2018-04-30 NOTE — Progress Notes (Signed)
Subjective:  Patient ID: Patrick Downs, male    DOB: 1976-02-16  Age: 43 y.o. MRN: [id number]  CC: Back Pain (lower)   Procedure: None  HPI Patrick Downs presents for reevaluation.  His last seen a few months ago and has been doing about the same.  He is taking his medications as prescribed and he is averaging 4 times daily dosing.  Based on his narcotic assessment sheet he is continue to get good relief from his back pain and leg pain spasming.  The quality characteristic distribution of his pain have been otherwise unchanged.  He still working at his job which does require significant heavy lifting and this does exacerbate his pain.  Unfortunately he has had pain that is quite recalcitrant to conservative therapy.  No change in bowel or bladder function are noted.  Outpatient Medications Prior to Visit  Medication Sig Dispense Refill  . calcium acetate (PHOSLO) 667 MG capsule Take 3 capsules (2,001 mg total) by mouth 3 (three) times daily with meals. (Patient taking differently: See admin instructions. Take 3 capsules (2001MG ) by mouth 3 times daily with meals and 2 capsules (1334MG ) by mouth daily with snacks) 1 capsule 0  . gabapentin (NEURONTIN) 300 MG capsule Take 600 mg by mouth at bedtime.     . midodrine (PROAMATINE) 10 MG tablet Take 1 tablet (10 mg total) by mouth 3 (three) times daily with meals. 90 tablet 0  . sildenafil (REVATIO) 20 MG tablet Take 20-100 mg by mouth as needed (for ED).     Marland Kitchen oxyCODONE-acetaminophen (PERCOCET/ROXICET) 5-325 MG tablet Take by mouth every 6 (six) hours as needed for severe pain.     No facility-administered medications prior to visit.     Review of Systems CNS: No confusion or sedation Cardiac: No angina or palpitations GI: No abdominal pain or constipation Constitutional: No nausea vomiting fevers or chills  Objective:  BP (!) 136/93   Pulse 87   Temp 97.8 F (36.6 C) (Oral)   Resp 16   Ht 6\' 2"  (1.88 m)   Wt 250 lb (113.4 kg)    SpO2 100%   BMI 32.10 kg/m    BP Readings from Last 3 Encounters:  04/29/18 (!) 136/93  03/26/18 (!) 148/100  03/01/18 138/85     Wt Readings from Last 3 Encounters:  04/29/18 250 lb (113.4 kg)  03/26/18 250 lb (113.4 kg)  03/01/18 266 lb (120.7 kg)     Physical Exam Pt is alert and oriented PERRL EOMI HEART IS RRR no murmur or rub LCTA no wheezing or rales MUSCULOSKELETAL reveals some paraspinous muscle tenderness but good muscle bulk in the lumbar paraspinous region.  He is ambulating well with good strength to the lower extremities as compared to baseline.  Labs  Lab Results  Component Value Date   HGBA1C 5.5 08/16/2017   Lab Results  Component Value Date   CREATININE 13.96 (H) 02/06/2018    -------------------------------------------------------------------------------------------------------------------- Lab Results  Component Value Date   WBC 6.3 02/06/2018   HGB 10.5 (L) 02/07/2018   HCT 31.0 (L) 02/07/2018   PLT 169 02/06/2018   GLUCOSE 88 02/07/2018   TRIG 673 (H) 08/21/2014   ALT 27 01/19/2018   AST 28 01/19/2018   NA 138 02/07/2018   K 4.0 02/07/2018   CL 101 02/06/2018   CREATININE 13.96 (H) 02/06/2018   BUN 60 (H) 02/06/2018   CO2 23 02/06/2018   TSH 5.560 (H) 08/27/2014   INR 1.13 02/06/2018  HGBA1C 5.5 08/16/2017    --------------------------------------------------------------------------------------------------------------------- No results found.   Assessment & Plan:   Patrick Downs was seen today for back pain.  Diagnoses and all orders for this visit:  DDD (degenerative disc disease), lumbar  Chronic bilateral low back pain without sciatica  Facet arthritis of lumbar region  Weakness of both lower extremities  Chronic, continuous use of opioids  Chronic pain syndrome  ESRD on dialysis ([place])  Guillain-Barre syndrome ([place])  Other orders -     Discontinue: oxyCODONE-acetaminophen (PERCOCET/ROXICET) 5-325 MG tablet; Take  1 tablet by mouth every 6 (six) hours as needed for severe pain. -     oxyCODONE-acetaminophen (PERCOCET/ROXICET) 5-325 MG tablet; Take 1 tablet by mouth every 6 (six) hours as needed for severe pain.        ----------------------------------------------------------------------------------------------------------------------  Problem List Items Addressed This Visit      Unprioritized   ESRD on dialysis [facility])    Other Visit Diagnoses    DDD (degenerative disc disease), lumbar    -  Primary   Relevant Medications   oxyCODONE-acetaminophen (PERCOCET/ROXICET) 5-325 MG tablet (Start on 05/29/2018)   Chronic bilateral low back pain without sciatica       Relevant Medications   oxyCODONE-acetaminophen (PERCOCET/ROXICET) 5-325 MG tablet (Start on 05/29/2018)   Facet arthritis of lumbar region       Relevant Medications   oxyCODONE-acetaminophen (PERCOCET/ROXICET) 5-325 MG tablet (Start on 05/29/2018)   Weakness of both lower extremities       Chronic, continuous use of opioids       Chronic pain syndrome       Guillain-Barre syndrome ([place])            ----------------------------------------------------------------------------------------------------------------------  1. DDD (degenerative disc disease), lumbar Continue with core stretching strengthening exercises as previously reviewed.  We will keep him on his current medication regimen which seems to be working well.  Refills will be given for the next 2 months.  We will plan on a repeat urine drug screen at his next visit.  2. Chronic bilateral low back pain without sciatica As above  3. Facet arthritis of lumbar region   4. Weakness of both lower extremities Continue follow-up with his primary care physicians  5. Chronic, continuous use of opioids We have reviewed the [facility] practitioner database information and it is appropriate.  Refills will be given for the next 2 months.  Return to clinic in 2 months.  6. Chronic  pain syndrome   7. ESRD on dialysis ([place])   8. Guillain-Barre syndrome ([place])     ----------------------------------------------------------------------------------------------------------------------  I am having Patrick Downs maintain his calcium acetate, gabapentin, sildenafil, midodrine, and oxyCODONE-acetaminophen.   Meds ordered this encounter  Medications  . DISCONTD: oxyCODONE-acetaminophen (PERCOCET/ROXICET) 5-325 MG tablet    Sig: Take 1 tablet by mouth every 6 (six) hours as needed for severe pain.    Dispense:  120 tablet    Refill:  0  . oxyCODONE-acetaminophen (PERCOCET/ROXICET) 5-325 MG tablet    Sig: Take 1 tablet by mouth every 6 (six) hours as needed for severe pain.    Dispense:  120 tablet    Refill:  0   Patient's Medications  New Prescriptions   No medications on file  Previous Medications   CALCIUM ACETATE (PHOSLO) 667 MG CAPSULE    Take 3 capsules (2,001 mg total) by mouth 3 (three) times daily with meals.   GABAPENTIN (NEURONTIN) 300 MG CAPSULE    Take 600 mg by mouth at bedtime.  MIDODRINE (PROAMATINE) 10 MG TABLET    Take 1 tablet (10 mg total) by mouth 3 (three) times daily with meals.   SILDENAFIL (REVATIO) 20 MG TABLET    Take 20-100 mg by mouth as needed (for ED).   Modified Medications   Modified Medication Previous Medication   OXYCODONE-ACETAMINOPHEN (PERCOCET/ROXICET) 5-325 MG TABLET oxyCODONE-acetaminophen (PERCOCET/ROXICET) 5-325 MG tablet      Take 1 tablet by mouth every 6 (six) hours as needed for severe pain.    Take by mouth every 6 (six) hours as needed for severe pain.  Discontinued Medications   No medications on file   ----------------------------------------------------------------------------------------------------------------------  Follow-up: Return in about 2 months (around 06/28/2018) for evaluation, med refill.    Molli Barrows, MD

## 2018-05-08 ENCOUNTER — Encounter (INDEPENDENT_AMBULATORY_CARE_PROVIDER_SITE_OTHER): Payer: Medicare Other

## 2018-05-08 ENCOUNTER — Ambulatory Visit (INDEPENDENT_AMBULATORY_CARE_PROVIDER_SITE_OTHER): Payer: Medicare Other | Admitting: Nurse Practitioner

## 2018-05-15 ENCOUNTER — Other Ambulatory Visit: Payer: Self-pay | Admitting: Podiatry

## 2018-05-16 MED ORDER — CEFAZOLIN SODIUM-DEXTROSE 2-4 GM/100ML-% IV SOLN
2.0000 g | INTRAVENOUS | Status: AC
  Administered 2018-05-17: 2000 mg via INTRAVENOUS

## 2018-05-16 NOTE — OR Nursing (Signed)
Patient and wife called to verify what medications to take in am prior to surgery. Patient reports they were not given any instructions from surgeon's office regarding medications.  Reviewed medications with Dr Rosey Bath and patient was instructed that he could take pain medication if needed and to take his blood pressure medication.  Verbalized understanding.

## 2018-05-17 ENCOUNTER — Encounter: Payer: Self-pay | Admitting: *Deleted

## 2018-05-17 ENCOUNTER — Other Ambulatory Visit: Payer: Self-pay

## 2018-05-17 ENCOUNTER — Ambulatory Visit: Payer: Medicare Other | Admitting: Anesthesiology

## 2018-05-17 ENCOUNTER — Encounter
Admission: RE | Disposition: A | Discharge status: Home / Self Care | Payer: Self-pay | Source: Home / Self Care | Attending: Podiatry

## 2018-05-17 ENCOUNTER — Encounter (INDEPENDENT_AMBULATORY_CARE_PROVIDER_SITE_OTHER): Payer: Self-pay | Admitting: Vascular Surgery

## 2018-05-17 ENCOUNTER — Ambulatory Visit (INDEPENDENT_AMBULATORY_CARE_PROVIDER_SITE_OTHER): Payer: Medicare Other | Admitting: Vascular Surgery

## 2018-05-17 ENCOUNTER — Ambulatory Visit
Admission: RE | Admit: 2018-05-17 | Discharge: 2018-05-17 | Disposition: A | Discharge status: Home / Self Care | Payer: Medicare Other | Attending: Podiatry | Admitting: Podiatry

## 2018-05-17 ENCOUNTER — Ambulatory Visit (INDEPENDENT_AMBULATORY_CARE_PROVIDER_SITE_OTHER): Payer: Medicare Other

## 2018-05-17 VITALS — BP 143/86 | HR 92 | Resp 16 | Ht 74.0 in | Wt 238.0 lb

## 2018-05-17 DIAGNOSIS — Z8669 Personal history of other diseases of the nervous system and sense organs: Secondary | ICD-10-CM | POA: Diagnosis not present

## 2018-05-17 DIAGNOSIS — M86172 Other acute osteomyelitis, left ankle and foot: Secondary | ICD-10-CM | POA: Insufficient documentation

## 2018-05-17 DIAGNOSIS — D631 Anemia in chronic kidney disease: Secondary | ICD-10-CM | POA: Diagnosis not present

## 2018-05-17 DIAGNOSIS — I120 Hypertensive chronic kidney disease with stage 5 chronic kidney disease or end stage renal disease: Secondary | ICD-10-CM | POA: Diagnosis not present

## 2018-05-17 DIAGNOSIS — R42 Dizziness and giddiness: Secondary | ICD-10-CM | POA: Insufficient documentation

## 2018-05-17 DIAGNOSIS — L97522 Non-pressure chronic ulcer of other part of left foot with fat layer exposed: Secondary | ICD-10-CM | POA: Diagnosis not present

## 2018-05-17 DIAGNOSIS — Z992 Dependence on renal dialysis: Secondary | ICD-10-CM | POA: Diagnosis not present

## 2018-05-17 DIAGNOSIS — E1122 Type 2 diabetes mellitus with diabetic chronic kidney disease: Secondary | ICD-10-CM | POA: Insufficient documentation

## 2018-05-17 DIAGNOSIS — G629 Polyneuropathy, unspecified: Secondary | ICD-10-CM | POA: Diagnosis not present

## 2018-05-17 DIAGNOSIS — Z7682 Awaiting organ transplant status: Secondary | ICD-10-CM | POA: Insufficient documentation

## 2018-05-17 DIAGNOSIS — E892 Postprocedural hypoparathyroidism: Secondary | ICD-10-CM | POA: Insufficient documentation

## 2018-05-17 DIAGNOSIS — Z833 Family history of diabetes mellitus: Secondary | ICD-10-CM | POA: Insufficient documentation

## 2018-05-17 DIAGNOSIS — N2581 Secondary hyperparathyroidism of renal origin: Secondary | ICD-10-CM | POA: Diagnosis not present

## 2018-05-17 DIAGNOSIS — Z888 Allergy status to other drugs, medicaments and biological substances status: Secondary | ICD-10-CM | POA: Insufficient documentation

## 2018-05-17 DIAGNOSIS — G610 Guillain-Barre syndrome: Secondary | ICD-10-CM | POA: Diagnosis not present

## 2018-05-17 DIAGNOSIS — Z885 Allergy status to narcotic agent status: Secondary | ICD-10-CM | POA: Diagnosis not present

## 2018-05-17 DIAGNOSIS — Z79899 Other long term (current) drug therapy: Secondary | ICD-10-CM | POA: Diagnosis not present

## 2018-05-17 DIAGNOSIS — I509 Heart failure, unspecified: Secondary | ICD-10-CM | POA: Diagnosis not present

## 2018-05-17 DIAGNOSIS — M5136 Other intervertebral disc degeneration, lumbar region: Secondary | ICD-10-CM | POA: Diagnosis not present

## 2018-05-17 DIAGNOSIS — N186 End stage renal disease: Secondary | ICD-10-CM

## 2018-05-17 DIAGNOSIS — H53149 Visual discomfort, unspecified: Secondary | ICD-10-CM | POA: Insufficient documentation

## 2018-05-17 DIAGNOSIS — E875 Hyperkalemia: Secondary | ICD-10-CM | POA: Diagnosis not present

## 2018-05-17 DIAGNOSIS — Z89421 Acquired absence of other right toe(s): Secondary | ICD-10-CM | POA: Diagnosis not present

## 2018-05-17 DIAGNOSIS — I10 Essential (primary) hypertension: Secondary | ICD-10-CM

## 2018-05-17 DIAGNOSIS — I132 Hypertensive heart and chronic kidney disease with heart failure and with stage 5 chronic kidney disease, or end stage renal disease: Secondary | ICD-10-CM | POA: Insufficient documentation

## 2018-05-17 HISTORY — PX: AMPUTATION TOE: SHX6595

## 2018-05-17 LAB — POCT I-STAT 4, (NA,K, GLUC, HGB,HCT)
Glucose, Bld: 96 mg/dL (ref 70–99)
HCT: 30 % — ABNORMAL LOW (ref 39.0–52.0)
Hemoglobin: 10.2 g/dL — ABNORMAL LOW (ref 13.0–17.0)
Potassium: 4.6 mmol/L (ref 3.5–5.1)
Sodium: 141 mmol/L (ref 135–145)

## 2018-05-17 SURGERY — AMPUTATION, TOE
Anesthesia: General | Laterality: Left

## 2018-05-17 MED ORDER — ROCURONIUM BROMIDE 50 MG/5ML IV SOLN
INTRAVENOUS | Status: AC
  Filled 2018-05-17: qty 1

## 2018-05-17 MED ORDER — LIDOCAINE HCL (CARDIAC) PF 100 MG/5ML IV SOSY
PREFILLED_SYRINGE | INTRAVENOUS | Status: DC | PRN
  Administered 2018-05-17 (×2): 100 mg via INTRAVENOUS

## 2018-05-17 MED ORDER — MIDAZOLAM HCL 2 MG/2ML IJ SOLN
INTRAMUSCULAR | Status: DC | PRN
  Administered 2018-05-17: 2 mg via INTRAVENOUS

## 2018-05-17 MED ORDER — PROPOFOL 10 MG/ML IV BOLUS
INTRAVENOUS | Status: DC | PRN
  Administered 2018-05-17: 60 mg via INTRAVENOUS
  Administered 2018-05-17: 20 mg via INTRAVENOUS

## 2018-05-17 MED ORDER — CEFAZOLIN SODIUM-DEXTROSE 2-4 GM/100ML-% IV SOLN
INTRAVENOUS | Status: AC
  Filled 2018-05-17: qty 100

## 2018-05-17 MED ORDER — ONDANSETRON HCL 4 MG PO TABS: 4.0000 mg | ORAL_TABLET | Freq: Four times a day (QID) | ORAL | Status: DC | PRN

## 2018-05-17 MED ORDER — FENTANYL CITRATE (PF) 100 MCG/2ML IJ SOLN
INTRAMUSCULAR | Status: AC
  Filled 2018-05-17: qty 2

## 2018-05-17 MED ORDER — FENTANYL CITRATE (PF) 100 MCG/2ML IJ SOLN: 25.0000 ug | INTRAMUSCULAR | Status: DC | PRN

## 2018-05-17 MED ORDER — ONDANSETRON HCL 4 MG/2ML IJ SOLN: 4.0000 mg | Freq: Four times a day (QID) | INTRAMUSCULAR | Status: DC | PRN

## 2018-05-17 MED ORDER — BUPIVACAINE HCL (PF) 0.5 % IJ SOLN
INTRAMUSCULAR | Status: AC
  Filled 2018-05-17: qty 30

## 2018-05-17 MED ORDER — BUPIVACAINE HCL 0.5 % IJ SOLN
INTRAMUSCULAR | Status: DC | PRN
  Administered 2018-05-17: 5 mL

## 2018-05-17 MED ORDER — LIDOCAINE HCL (PF) 2 % IJ SOLN
INTRAMUSCULAR | Status: AC
  Filled 2018-05-17: qty 10

## 2018-05-17 MED ORDER — PROPOFOL 500 MG/50ML IV EMUL
INTRAVENOUS | Status: DC | PRN
  Administered 2018-05-17: 2 ug/kg/min via INTRAVENOUS

## 2018-05-17 MED ORDER — PROPOFOL 10 MG/ML IV BOLUS
INTRAVENOUS | Status: AC
  Filled 2018-05-17: qty 20

## 2018-05-17 MED ORDER — FENTANYL CITRATE (PF) 100 MCG/2ML IJ SOLN
INTRAMUSCULAR | Status: DC | PRN
  Administered 2018-05-17: 50 ug via INTRAVENOUS

## 2018-05-17 MED ORDER — PROPOFOL 500 MG/50ML IV EMUL
INTRAVENOUS | Status: AC
  Filled 2018-05-17: qty 50

## 2018-05-17 MED ORDER — MIDAZOLAM HCL 2 MG/2ML IJ SOLN
INTRAMUSCULAR | Status: AC
  Filled 2018-05-17: qty 2

## 2018-05-17 MED ORDER — LIDOCAINE HCL (PF) 1 % IJ SOLN
INTRAMUSCULAR | Status: DC | PRN
  Administered 2018-05-17: 5 mL

## 2018-05-17 MED ORDER — SODIUM CHLORIDE 0.9 % IV SOLN
INTRAVENOUS | Status: DC
  Administered 2018-05-17: 09:00:00 via INTRAVENOUS

## 2018-05-17 MED ORDER — POVIDONE-IODINE 7.5 % EX SOLN
Freq: Once | CUTANEOUS | Status: AC
  Administered 2018-05-17: 09:00:00 via TOPICAL
  Filled 2018-05-17: qty 118

## 2018-05-17 MED ORDER — OXYCODONE-ACETAMINOPHEN 5-325 MG PO TABS: 1.0000 | ORAL_TABLET | ORAL | 0 refills | Status: DC | PRN

## 2018-05-17 MED ORDER — CLINDAMYCIN HCL 300 MG PO CAPS: 300.0000 mg | ORAL_CAPSULE | Freq: Three times a day (TID) | ORAL | 0 refills | Status: DC

## 2018-05-17 SURGICAL SUPPLY — 43 items
BANDAGE ELASTIC 4 LF NS (GAUZE/BANDAGES/DRESSINGS) ×2 IMPLANT
BLADE OSC/SAGITTAL MD 5.5X18 (BLADE) ×2 IMPLANT
BLADE SURG MINI STRL (BLADE) ×2 IMPLANT
BNDG CONFORM 2 STRL LF (GAUZE/BANDAGES/DRESSINGS) ×2 IMPLANT
BNDG CONFORM 3 STRL LF (GAUZE/BANDAGES/DRESSINGS) ×4 IMPLANT
BNDG ESMARK 4X12 TAN STRL LF (GAUZE/BANDAGES/DRESSINGS) ×2 IMPLANT
BNDG GAUZE 4.5X4.1 6PLY STRL (MISCELLANEOUS) ×2 IMPLANT
CANISTER SUCT 1200ML W/VALVE (MISCELLANEOUS) ×2 IMPLANT
COVER WAND RF STERILE (DRAPES) ×2 IMPLANT
DRAPE FLUOR MINI C-ARM 54X84 (DRAPES) ×2 IMPLANT
DRAPE XRAY CASSETTE 23X24 (DRAPES) ×2 IMPLANT
DURAPREP 26ML APPLICATOR (WOUND CARE) ×2 IMPLANT
ELECT REM PT RETURN 9FT ADLT (ELECTROSURGICAL) ×2
ELECTRODE REM PT RTRN 9FT ADLT (ELECTROSURGICAL) ×1 IMPLANT
GAUZE PACKING IODOFORM 1/2 (PACKING) ×2 IMPLANT
GAUZE PETRO XEROFOAM 1X8 (MISCELLANEOUS) ×2 IMPLANT
GAUZE SPONGE 4X4 12PLY STRL (GAUZE/BANDAGES/DRESSINGS) ×2 IMPLANT
GLOVE BIO SURGEON STRL SZ7.5 (GLOVE) ×2 IMPLANT
GLOVE INDICATOR 8.0 STRL GRN (GLOVE) ×2 IMPLANT
GOWN STRL REUS W/ TWL LRG LVL3 (GOWN DISPOSABLE) ×2 IMPLANT
GOWN STRL REUS W/TWL LRG LVL3 (GOWN DISPOSABLE) ×2
HANDPIECE VERSAJET DEBRIDEMENT (MISCELLANEOUS) ×2 IMPLANT
KIT TURNOVER KIT A (KITS) ×2 IMPLANT
LABEL OR SOLS (LABEL) ×2 IMPLANT
NEEDLE FILTER BLUNT 18X 1/2SAF (NEEDLE) ×1
NEEDLE FILTER BLUNT 18X1 1/2 (NEEDLE) ×1 IMPLANT
NEEDLE HYPO 25X1 1.5 SAFETY (NEEDLE) ×2 IMPLANT
NS IRRIG 500ML POUR BTL (IV SOLUTION) ×2 IMPLANT
PACK EXTREMITY ARMC (MISCELLANEOUS) ×2 IMPLANT
PAD ABD DERMACEA PRESS 5X9 (GAUZE/BANDAGES/DRESSINGS) ×4 IMPLANT
PULSAVAC PLUS IRRIG FAN TIP (DISPOSABLE)
SHIELD FULL FACE ANTIFOG 7M (MISCELLANEOUS) ×2 IMPLANT
SOL .9 NS 3000ML IRR  AL (IV SOLUTION) ×1
SOL .9 NS 3000ML IRR UROMATIC (IV SOLUTION) ×1 IMPLANT
SPONGE LAP 18X18 RF (DISPOSABLE) ×2 IMPLANT
STOCKINETTE M/LG 89821 (MISCELLANEOUS) ×2 IMPLANT
STRAP SAFETY 5IN WIDE (MISCELLANEOUS) ×2 IMPLANT
SUT ETHILON 3-0 FS-10 30 BLK (SUTURE) ×2
SUT ETHILON 5-0 FS-2 18 BLK (SUTURE) ×2 IMPLANT
SUT VIC AB 4-0 FS2 27 (SUTURE) ×2 IMPLANT
SUTURE EHLN 3-0 FS-10 30 BLK (SUTURE) ×1 IMPLANT
SYR 10ML LL (SYRINGE) ×6 IMPLANT
TIP FAN IRRIG PULSAVAC PLUS (DISPOSABLE) IMPLANT

## 2018-05-17 NOTE — Anesthesia Procedure Notes (Signed)
Procedure Name: MAC Date/Time: 05/17/2018 10:50 AM Performed by: Allean Found, CRNA Pre-anesthesia Checklist: Patient identified, Emergency Drugs available, Suction available, Patient being monitored and Timeout performed Patient Re-evaluated:Patient Re-evaluated prior to induction Oxygen Delivery Method: Nasal cannula Placement Confirmation: positive ETCO2

## 2018-05-17 NOTE — Anesthesia Post-op Follow-up Note (Signed)
Anesthesia QCDR form completed.        

## 2018-05-17 NOTE — Discharge Instructions (Signed)
AMBULATORY SURGERY  DISCHARGE INSTRUCTIONS   1) The drugs that you were given will stay in your system until tomorrow so for the next 24 hours you should not:  A) Drive an automobile B) Make any legal decisions C) Drink any alcoholic beverage   2) You may resume regular meals tomorrow.  Today it is better to start with liquids and gradually work up to solid foods.  You may eat anything you prefer, but it is better to start with liquids, then soup and crackers, and gradually work up to solid foods.   3) Please notify your doctor immediately if you have any unusual bleeding, trouble breathing, redness and pain at the surgery site, drainage, fever, or pain not relieved by medication.    4) Additional Instructions: Please call Dr Karna Christmas office for appt for 1 week follow up        Please contact your physician with any problems or Same Day Surgery at [phone], Monday through Friday 6 am to 4 pm, or [place] at [facility] number at [phone].[address] [place]   1. Take your medication as prescribed.  Pain medication should be taken only as needed.  2. Keep the dressing clean, dry and intact.  Can change if dressing becomes saturated or soiled.  3. Keep your foot elevated above the heart level for the first 48 hours.  4. Walking to the bathroom and brief periods of walking are acceptable, unless we have instructed you to be non-weight bearing.  5. Always wear your post-op shoe when walking.  Always use your crutches if you are to be non-weight bearing.  6. Do not take a shower. Baths are permissible as long as the foot is kept out of the water.   7. Every hour you are awake:  - Bend your knee 15 times. - Flex foot 15 times - Massage calf 15 times  8. Call [facility] [phone]) if any of the following problems  occur: - You develop a temperature or fever. - The bandage becomes saturated with blood. - Medication does not stop your pain. - Injury of the foot occurs. - Any symptoms of infection including redness, odor, or red streaks running from wound.
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
## 2018-05-17 NOTE — Assessment & Plan Note (Signed)
Duplex today shows the jump graft in the fistula to be widely patent with the outflow through the deep venous system in the upper arm.  He should begin using his fistula at this point.  Once his fistula has been used for approximately 2 weeks the catheter should be removed.  I will plan to repeat a duplex in 6 months to follow the fistula.  He does have very limited access options.

## 2018-05-17 NOTE — Op Note (Signed)
Operative note   Surgeon:Morgen Linebaugh Lawyer: None    Preop diagnosis: Osteomyelitis left fifth metatarsal, neuropathic ulcer left foot    Postop diagnosis: Same    Procedure:1. Partial fifth ray amputation left foot  2.  Local skin fillet flap for wound closure left foot    EBL: Minimal    Anesthesia:local and IV sedation.  Local consisted of a one-to-one mixture of 0.5% bupivacaine and 1% lidocaine plain    Hemostasis: Mid calf tourniquet inflated to 200 mmHg for approximately 10 minutes    Specimen: Bone for wound culture and partial fifth ray amputation for pathology.    Complications: None    Operative indications:Patrick Downs is an 43 y.o. that presents today for surgical intervention.  The risks/benefits/alternatives/complications have been discussed and consent has been given.    Procedure:  Patient was brought into the OR and placed on the operating table in thesupine position. After anesthesia was obtained theleft lower extremity was prepped and draped in usual sterile fashion.  Attention was directed to the lateral aspect of the left foot where along the lateral fifth metatarsal and metatarsophalangeal joint a longitudinal incision was performed.  There was noted to be a large ulceration just deep to the incision site.  Full-thickness incision was taken down to bone.  2 skin flaps were created distally for the fifth toe.  Full-thickness incision was made in the fifth metatarsal was exposed in the midshaft region.  A through and through osteotomy was created in the proximal margin of the incision.  The metatarsal and toe were then removed from the surgical field in toto.  At this time the ulceration was excisionally debrided down to muscle and tendon with the versa jet.  All areas were flushed with copious amounts of irrigation.  The proximal margin of the incision was initially closed.  Further revision and local skin flap with a revisional fillet flap created  laterally in order to close the lateral aspect at the ulceration.    The distal aspect of the incision was then closed appropriately.  All closure was performed with a 3-0 nylon.  At this time bulky sterile dressing was applied to the lateral aspect of the left foot.    Patient tolerated the procedure and anesthesia well.  Was transported from the OR to the PACU with all vital signs stable and vascular status intact. To be discharged per routine protocol.  Will follow up in approximately 1 week in the outpatient clinic.

## 2018-05-17 NOTE — Transfer of Care (Signed)
Immediate Anesthesia Transfer of Care Note  Patient: Patrick Downs  Procedure(s) Performed: RAY LEFT 5TH (Left )  Patient Location: PACU  Anesthesia Type:General  Level of Consciousness: awake  Airway & Oxygen Therapy: Patient Spontanous Breathing and Patient connected to face mask oxygen  Post-op Assessment: Report given to RN and Post -op Vital signs reviewed and stable  Post vital signs: Reviewed and stable  Last Vitals:  Vitals Value Taken Time  BP 126/76 05/17/2018 11:45 AM  Temp 36.3 C 05/17/2018 11:45 AM  Pulse 86 05/17/2018 11:50 AM  Resp 13 05/17/2018 11:50 AM  SpO2 100 % 05/17/2018 11:50 AM  Vitals shown include unvalidated device data.  Last Pain:  Vitals:   05/17/18 1145  TempSrc:   PainSc: 0-No pain         Complications: No apparent anesthesia complications

## 2018-05-17 NOTE — Progress Notes (Signed)
MRN : [id number]  Patrick Downs is a 43 y.o. (November 30, 1975) male who presents with chief complaint of  Chief Complaint  Patient presents with  . Follow-up  .  History of Present Illness: Patient returns today in follow up of his dialysis access.  He actually went through a toe amputation on his left foot today for infection.  He was told his flow was okay.  It is not overtly painful and he does have some neuropathy in his feet.  As for his dialysis access, he underwent a jump graft revision of the left forearm access about 3 to 4 months ago.  This has healed well.  He has not yet used this for dialysis.  Duplex today shows the jump graft in the fistula to be widely patent with the outflow through the deep venous system in the upper arm.  Current Outpatient Medications  Medication Sig Dispense Refill  . acetaminophen (TYLENOL) 500 MG tablet Take 1,000-1,500 mg by mouth 2 (two) times daily as needed for moderate pain or headache.    . calcium acetate (PHOSLO) 667 MG capsule Take 3 capsules (2,001 mg total) by mouth 3 (three) times daily with meals. (Patient taking differently: Take 1,334-2,001 mg by mouth 3 (three) times daily with meals. ) 1 capsule 0  . gabapentin (NEURONTIN) 300 MG capsule Take 600 mg by mouth at bedtime.     . midodrine (PROAMATINE) 10 MG tablet Take 1 tablet (10 mg total) by mouth 3 (three) times daily with meals. (Patient taking differently: Take 10 mg by mouth 3 (three) times daily with meals. On Tues, Thurs, and Sat (dialysis days) take 20 mg before dialysis and 10 mg in the evening, on Mon, Wed, Fri, and Sun take 10 mg 3 times daily) 90 tablet 0  . oxyCODONE-acetaminophen (PERCOCET) 5-325 MG tablet Take 1 tablet by mouth every 4 (four) hours as needed for severe pain. 20 tablet 0  . sildenafil (REVATIO) 20 MG tablet Take 20-100 mg by mouth as needed (for ED).      No current facility-administered medications for this visit.     Past Medical History:  Diagnosis Date    . Depression   . Dialysis patient ([place])   . ESRD (end stage renal disease) on dialysis ([place])   . Failure to thrive in adult   . Gout   . Guillain Barr syndrome ([place])   . HTN (hypertension)   . Kidney failure   . Pneumonia   . Renal insufficiency   . Respiratory failure [facility])     Past Surgical History:  Procedure Laterality Date  . A/V FISTULAGRAM Right 01/07/2018   Procedure: A/V FISTULAGRAM;  Surgeon: Algernon Huxley, MD;  Location: [place] [facility];  Service: Cardiovascular;  Laterality: Right;  . AMPUTATION TOE Right 06/08/2017   Procedure: AMPUTATION TOE RIGHT FIFTH TOE;  Surgeon: Samara Deist, DPM;  Location: [facility] ORS;  Service: Podiatry;  Laterality: Right;  . AV FISTULA PLACEMENT     x5      2 graphs  . PARATHYROIDECTOMY    . RENAL BIOPSY    . REVISON OF ARTERIOVENOUS FISTULA Right 02/07/2018   Procedure: REVISON OF ARTERIOVENOUS FISTULA;  Surgeon: Algernon Huxley, MD;  Location: [facility] OR[facility];  Service: Vascular;  Laterality: Right;  . TEE WITHOUT CARDIOVERSION N/A 01/22/2018   Procedure: TRANSESOPHAGEAL ECHOCARDIOGRAM (TEE);  Surgeon: Dionisio David, MD;  Location: [facility] ORS;  Service: Cardiovascular;  Laterality: N/A;  . tonsiilectomy    .  tracheotomy      Social History Social History   Tobacco Use  . Smoking status: Never Smoker  . Smokeless tobacco: Never Used  Substance Use Topics  . Alcohol use: No  . Drug use: Yes    Types: Marijuana    Comment: last time was June    Family History Family History  Problem Relation Age of Onset  . Diabetes Mellitus II Father   . Kidney disease Father   . Kidney failure Paternal Grandfather   . Prostate cancer Neg Hx   . Kidney cancer Neg Hx   . Bladder Cancer Neg Hx     Allergies  Allergen Reactions  . Ondansetron Other (See Comments)    Stomach pain   . Minoxidil Other (See Comments)    "put fluid around my heart", PERICARDIAL EFFUSION  . Morphine And Related Other (See Comments)    Aggressive   . Omnipaque  [Iohexol] Itching and Other (See Comments)    Rigors on one occasion, widespread itching on a separate occasion (resolved with Benadryl), tremors   REVIEW OF SYSTEMS(Negative unless checked)  Constitutional: [] ?Weight loss[] ?Fever[] ?Chills Cardiac:[] ?Chest pain[] ?Chest pressure[] ?Palpitations [] ?Shortness of breath when laying flat [] ?Shortness of breath at rest [] ?Shortness of breath with exertion. Vascular: [] ?Pain in legs with walking[] ?Pain in legsat rest[] ?Pain in legs when laying flat [] ?Claudication [] ?Pain in feet when walking [] ?Pain in feet at rest [] ?Pain in feet when laying flat [] ?History of DVT [] ?Phlebitis [] ?Swelling in legs [] ?Varicose veins [x] ?Non-healing ulcers Pulmonary: [] ?Uses home oxygen [] ?Productive cough[] ?Hemoptysis [] ?Wheeze [] ?COPD [] ?Asthma Neurologic: [] ?Dizziness [] ?Blackouts [] ?Seizures [] ?History of stroke [] ?History of TIA[] ?Aphasia [] ?Temporary blindness[] ?Dysphagia [] ?Weaknessor numbness in arms [x] ?Weakness or numbnessin legs Musculoskeletal: [x] ?Arthritis [] ?Joint swelling [] ?Joint pain [] ?Low back pain Hematologic:[] ?Easy bruising[] ?Easy bleeding [] ?Hypercoagulable state [x] ?Anemic [] ?Hepatitis Gastrointestinal:[] ?Blood in stool[] ?Vomiting blood[] ?Gastroesophageal reflux/heartburn[] ?Abdominal pain Genitourinary: [x] ?Chronic kidney disease [] ?Difficulturination [] ?Frequenturination [] ?Burning with urination[] ?Hematuria Skin: [] ?Rashes [x] ?Ulcers [x] ?Wounds Psychological: [] ?History of anxiety[x] ?History of major depression.  Physical Examination  BP (!) 143/86 (BP Location: Left Arm, Patient Position: Sitting, Cuff Size: Normal)   Pulse 92   Resp 16   Ht 6\' 2"  (1.88 m)   Wt 238 lb (108 kg)   BMI 30.56 kg/m  Gen:  WD/WN, NAD Head: [place]/AT, No temporalis wasting. Ear/Nose/Throat: Hearing grossly intact, nares w/o erythema or  drainage Eyes: Conjunctiva clear. Sclera non-icteric Neck: Supple.  Trachea midline Pulmonary:  Good air movement, no use of accessory muscles.  Cardiac: RRR, no JVD Vascular: Good thrill in the left forearm AV fistula and jump graft Vessel Right Left  Radial Palpable Palpable                   Musculoskeletal: M/S 5/5 throughout.  No deformity or atrophy.  Some deformity of his hands.  Left foot is wrapped in a large bulky dressing.  Neurologic: Sensation grossly intact in extremities.  Symmetrical.  Speech is fluent.  Psychiatric: Judgment intact, Mood & affect appropriate for pt's clinical situation. Dermatologic: Left foot wound is currently dressed       Labs Recent Results (from the past 2160 hour(s))  Drug Screen 10 W/Conf, Serum     Status: None   Collection Time: 02/27/18  3:47 PM  Result Value Ref Range   Amphetamines, IA Negative Cutoff:50 ng/mL   Barbiturates, IA Negative Cutoff:0.1 ug/mL   Benzodiazepines, IA Negative Cutoff:20 ng/mL   Cocaine & Metabolite, IA Negative Cutoff:25 ng/mL   Phencyclidine, IA Negative Cutoff:8 ng/mL   THC(Marijuana) Metabolite, IA Negative Cutoff:5 ng/mL   Opiates, IA Negative Cutoff:5 ng/mL  Oxycodones, IA Negative Cutoff:5 ng/mL    Comment: Presumptive immunoassay result indicated need for further testing; definitive confirmation was negative.    Methadone, IA Negative Cutoff:25 ng/mL   Propoxyphene, IA Negative Cutoff:50 ng/mL    Comment: This test was developed and its performance characteristics determined by [facility].  It has not been cleared or approved by the Food and Drug Administration.   Oxycodones,MS,WB/Sp Rfx     Status: None   Collection Time: 02/27/18  3:47 PM  Result Value Ref Range   Oxycodones Confirmation Negative    Oxycocone Negative ng/mL   Oxymorphone Negative ng/mL    Comment: Confirmation threshold: 1.0 ng/mL  I-STAT 4, (NA,K, GLUC, HGB,HCT)     Status: Abnormal   Collection Time: 05/17/18  9:11 AM   Result Value Ref Range   Sodium 141 135 - 145 mmol/L   Potassium 4.6 3.5 - 5.1 mmol/L   Glucose, Bld 96 70 - 99 mg/dL   HCT 30.0 (L) 39.0 - 52.0 %   Hemoglobin 10.2 (L) 13.0 - 17.0 g/dL    Radiology No results found.  Assessment/Plan HTN (hypertension) blood pressure control important in reducing the progression of atherosclerotic disease.Now actually having some more issues with low blood pressure on dialysis.   Guillain Barr syndrome, lower ext weakness Wears foot drop splints.  ESRD on dialysis Duplex today shows the jump graft in the fistula to be widely patent with the outflow through the deep venous system in the upper arm.  He should begin using his fistula at this point.  Once his fistula has been used for approximately 2 weeks the catheter should be removed.  I will plan to repeat a duplex in 6 months to follow the fistula.  He does have very limited access options.    Leotis Pain, MD  05/17/2018 4:27 PM    This note was created with Dragon medical transcription system.  Any errors from dictation are purely unintentional

## 2018-05-17 NOTE — H&P (Signed)
HISTORY AND PHYSICAL INTERVAL NOTE:  05/17/2018  10:21 AM  Patrick Downs  has presented today for surgery, with the diagnosis of L97.522  Skin ulcer left foot with fat layer exposed. Z61.096  Other acute osteomyelitis left foot.  The various methods of treatment have been discussed with the patient.  No guarantees were given.  After consideration of risks, benefits and other options for treatment, the patient has consented to surgery.  I have reviewed the patients' chart and labs.     A history and physical examination was performed in my office.  The patient was reexamined.  There have been no changes to this history and physical examination.  Samara Deist A

## 2018-05-19 NOTE — Anesthesia Postprocedure Evaluation (Signed)
Anesthesia Post Note  Patient: Patrick Downs  Procedure(s) Performed: RAY LEFT 5TH (Left )  Patient location during evaluation: PACU Anesthesia Type: General Level of consciousness: awake and alert Pain management: pain level controlled Vital Signs Assessment: post-procedure vital signs reviewed and stable Respiratory status: spontaneous breathing, nonlabored ventilation and respiratory function stable Cardiovascular status: blood pressure returned to baseline and stable Postop Assessment: no apparent nausea or vomiting Anesthetic complications: no     Last Vitals:  Vitals:   05/17/18 1226 05/17/18 1234  BP: (!) 152/93 (!) 159/101  Pulse: 80 90  Resp: 12 18  Temp: 36.6 C (!) 36.1 C  SpO2: 96% 97%    Last Pain:  Vitals:   05/17/18 1234  TempSrc: Temporal  PainSc: 0-No pain                 Durenda Hurt

## 2018-05-22 LAB — AEROBIC/ANAEROBIC CULTURE W GRAM STAIN (SURGICAL/DEEP WOUND): Gram Stain: NONE SEEN

## 2018-05-30 ENCOUNTER — Ambulatory Visit: Payer: Medicare Other | Attending: Infectious Diseases | Admitting: Infectious Diseases

## 2018-05-30 VITALS — BP 116/111 | HR 71 | Temp 97.9°F | Ht 74.0 in | Wt 251.1 lb

## 2018-05-30 DIAGNOSIS — Z89421 Acquired absence of other right toe(s): Secondary | ICD-10-CM

## 2018-05-30 DIAGNOSIS — Z885 Allergy status to narcotic agent status: Secondary | ICD-10-CM

## 2018-05-30 DIAGNOSIS — I120 Hypertensive chronic kidney disease with stage 5 chronic kidney disease or end stage renal disease: Secondary | ICD-10-CM

## 2018-05-30 DIAGNOSIS — N186 End stage renal disease: Secondary | ICD-10-CM | POA: Diagnosis not present

## 2018-05-30 DIAGNOSIS — M86072 Acute hematogenous osteomyelitis, left ankle and foot: Secondary | ICD-10-CM

## 2018-05-30 DIAGNOSIS — Z91041 Radiographic dye allergy status: Secondary | ICD-10-CM

## 2018-05-30 DIAGNOSIS — Z888 Allergy status to other drugs, medicaments and biological substances status: Secondary | ICD-10-CM

## 2018-05-30 DIAGNOSIS — Z8614 Personal history of Methicillin resistant Staphylococcus aureus infection: Secondary | ICD-10-CM

## 2018-05-30 DIAGNOSIS — M869 Osteomyelitis, unspecified: Secondary | ICD-10-CM

## 2018-05-30 DIAGNOSIS — A4902 Methicillin resistant Staphylococcus aureus infection, unspecified site: Secondary | ICD-10-CM

## 2018-05-30 DIAGNOSIS — Z992 Dependence on renal dialysis: Secondary | ICD-10-CM

## 2018-05-30 DIAGNOSIS — B9562 Methicillin resistant Staphylococcus aureus infection as the cause of diseases classified elsewhere: Secondary | ICD-10-CM | POA: Diagnosis not present

## 2018-05-30 DIAGNOSIS — Z89422 Acquired absence of other left toe(s): Secondary | ICD-10-CM

## 2018-05-30 DIAGNOSIS — G610 Guillain-Barre syndrome: Secondary | ICD-10-CM

## 2018-05-30 NOTE — Patient Instructions (Addendum)
You are here for a MRSA infection of the left foot, you recently had 5th toe partial ray amputation by Dr.Folwer on 05/17/18 and the culture of the bone had MRSA- the bone also showed osteomyelitis- You are currently  on clindamycin. You can get vancomycin during your dialysis for 2-4 weeks.  Will let your dialysis center know- they can also do labs once a week to check for levels and cbc/cmp. When you start the vancomycin stop taking clindamycin

## 2018-05-30 NOTE — Progress Notes (Signed)
NAME: Patrick Downs  DOB: [birth year]  MRN: [id number]  Date/Time: 05/30/2018 1:18 PM Subjective:  REASON FOR CONSULT: Patient referred to me for MRSA foot infection. ? Patrick Downs is a 43 y.o. male with a history of end-stage renal disease, Guillain-Barr syndrome, hypertension, recurrent foot infection, history of MRSA infections, Had an ulcer on the left foot since November 2019 and underwent partial fifth ray amputation of the left foot on 05/17/2018 by Dr. Vickki Muff.  The bone was sent for culture and pathology.He was prescribed clindamycin.  He is referred to me as the wound culture is MRSA.The pathology shows osteomyelitis of the fifth metatarsal extending to the inked proximal margin. Patient has had MRSA infection in the past on the right foot and in December/January 2019 was treated with IV vancomycin and then underwent  fifth toe amputation of the right foot in February 2019 . 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 Diabetes Mellitus II Father   . Kidney disease Father   . Kidney failure Paternal Grandfather   . Prostate cancer Neg Hx   . Kidney cancer Neg Hx   . Bladder Cancer Neg Hx    Allergies  Allergen Reactions  . Ondansetron Other (See Comments)    Stomach pain   . Minoxidil Other (See Comments)    "put fluid around my heart", PERICARDIAL EFFUSION  . Morphine And Related Other (See Comments)    Aggressive   . Omnipaque [Iohexol] Itching and Other (See Comments)    Rigors on one occasion, widespread itching on a separate  occasion (resolved with Benadryl), tremors   ? Current Outpatient Medications  Medication Sig Dispense Refill  . acetaminophen (TYLENOL) 500 MG tablet Take 1,000-1,500 mg by mouth 2 (two) times daily as needed for moderate pain or headache.    . calcium acetate (PHOSLO) 667 MG capsule Take 3 capsules (2,001 mg total) by mouth 3 (three) times daily with meals. (Patient taking differently: Take 1,334-2,001 mg by mouth 3 (three) times daily with meals. ) 1 capsule 0  . gabapentin (NEURONTIN) 300 MG capsule Take 600 mg by mouth at bedtime.     . midodrine (PROAMATINE) 10 MG tablet Take 1 tablet (10 mg total) by mouth 3 (three) times daily with meals. (Patient taking differently: Take 10 mg by mouth 3 (three) times daily with meals. On Tues, Thurs, and Sat (dialysis days) take 20 mg before dialysis and 10 mg in the evening, on Mon, Wed, Fri, and Sun take 10 mg 3 times daily) 90 tablet 0  . oxyCODONE-acetaminophen (PERCOCET) 5-325 MG tablet Take 1 tablet by mouth every 4 (four) hours as needed for severe pain. 20 tablet 0  . sildenafil (REVATIO) 20 MG tablet Take 20-100 mg by mouth as needed (for ED).     . clindamycin (CLEOCIN) 300 MG capsule Take 300 mg by mouth daily.     No current facility-administered medications for this visit.      Abtx:  Anti-infectives (From admission, onward)   None      REVIEW OF SYSTEMS:  Const: negative fever, negative chills, negative weight loss Eyes: negative diplopia or  visual changes, negative eye pain ENT: negative coryza, negative sore throat Resp: negative cough, hemoptysis, dyspnea Cards: negative for chest pain, palpitations, lower extremity edema GU: negative for frequency, dysuria and hematuria GI: Negative for abdominal pain, diarrhea, bleeding, constipation Skin: negative for rash and pruritus Heme: negative for easy bruising and gum/nose bleeding MS: negative for myalgias, arthralgias, back pain and muscle weakness Neurolo:foot drop Psych:  negative for feelings of anxiety, depression  Endocrine:no polyuria, no polydipsia Allergy/Immunology-as noted above: Objective:  VITALS:  BP (!) 116/111 (BP Location: Left Arm, Patient Position: Sitting, Cuff Size: Large)   Pulse 71   Temp 97.9 F (36.6 C) (Oral)   Ht 6\' 2"  (1.88 m)   Wt 251 lb 1.7 oz (113.9 kg)   BMI 32.24 kg/m  PHYSICAL EXAM:  General: Alert, cooperative, no distress, appears stated age.  Head: Normocephalic, without obvious abnormality, atraumatic. Eyes: Conjunctivae clear, anicteric sclerae. Pupils are equal ENT Nares normal. No drainage or sinus tenderness. Lips, mucosa, and tongue normal. No Thrush Neck: Supple, symmetrical, no adenopathy, thyroid: non tender no carotid bruit and no JVD. Back: No CVA tenderness. Lungs: Clear to auscultation bilaterally. No Wheezing or Rhonchi. No rales. Heart: Regular rate and rhythm, no murmur, rub or gallop. Abdomen: not examined Extremities: t Left foot-dressing removed   Surgical wound left lateral margin. Sutures present Skin: No rashes or lesionsa. Or bruising Lymph: Cervical, supraclavicular normal. Neurologic: contractures fingers Interosseous wasting Foot drop Pertinent Labs Lab Results CBC CBC Latest Ref Rng & Units 05/17/2018 02/07/2018 02/06/2018  WBC 4.0 - 10.5 K/uL - - 6.3  Hemoglobin 13.0 - 17.0 g/dL 10.2(L) 10.5(L) 10.2(L)  Hematocrit 39.0 - 52.0 % 30.0(L) 31.0(L) 32.2(L)  Platelets 150 - 400 K/uL - - 169    CMP Latest Ref Rng & Units 05/17/2018 02/07/2018 02/06/2018  Glucose 70 - 99 mg/dL 96 88 93  BUN 6 - 20 mg/dL - - 60(H)  Creatinine 0.61 - 1.24 mg/dL - - 13.96(H)  Sodium 135 - 145 mmol/L 141 138 140  Potassium 3.5 - 5.1 mmol/L 4.6 4.0 4.5  Chloride 98 - 111 mmol/L - - 101  CO2 22 - 32 mmol/L - - 23  Calcium 8.9 - 10.3 mg/dL - - 8.3(L)  Total Protein 6.5 - 8.1 g/dL - - -  Total Bilirubin 0.3 - 1.2 mg/dL - - -  Alkaline Phos 38 - 126 U/L - - -  AST 15 - 41 U/L - - -  ALT 0 - 44 U/L - -  -      Microbiology: Reviewed bone culture result from 1/24 IMAGING RESULTS: ?none Impression/Recommendation ?43 y.o. male with a history of end-stage renal disease, Guillain-Barr syndrome, hypertension, recurrent foot infection, history of MRSA infections, Had an ulcer on the left foot since November 2019 and underwent partial fifth ray amputation of the left foot on 05/17/2018 by Dr. Vickki Muff.  The bone was sent for culture and pathology.He was prescribed clindamycin.  He is referred to me as the wound culture is MRSA.The pathology shows osteomyelitis of the fifth metatarsal extending to the inked proximal margin. ? ?MRSA foot infection with osteomyelitis of the 5th toe of left foot. S/p partial fifth ray amputation __-on clindamycin- will do vancomycin for 2-4 weeks during dialysis  ESRD on dialysis  H/O GBS- residual foot and contractures hands  _________________________________________________ Discussed with patient Talked to his dialysis center- will fax orders to them

## 2018-05-31 ENCOUNTER — Encounter (INDEPENDENT_AMBULATORY_CARE_PROVIDER_SITE_OTHER): Payer: Self-pay

## 2018-05-31 ENCOUNTER — Telehealth: Payer: Self-pay | Admitting: Licensed Clinical Social Worker

## 2018-05-31 NOTE — Telephone Encounter (Signed)
Called [facility] nurse for the patient to fax a Vancomycin order for 1 gram for 4 weeks and they had already gotten an order from Dr. Elonda Husky at the dialysis center. They are giving the patient 1750 today and 1250 for the next 4 weeks.

## 2018-05-31 NOTE — Telephone Encounter (Signed)
Ok, thx

## 2018-06-04 ENCOUNTER — Other Ambulatory Visit (INDEPENDENT_AMBULATORY_CARE_PROVIDER_SITE_OTHER): Payer: Self-pay | Admitting: Nurse Practitioner

## 2018-06-04 MED ORDER — CEFAZOLIN SODIUM-DEXTROSE 1-4 GM/50ML-% IV SOLN: 1.0000 g | Freq: Once | INTRAVENOUS | Status: DC

## 2018-06-05 ENCOUNTER — Ambulatory Visit: Admission: RE | Admit: 2018-06-05 | Payer: Medicare Other | Source: Home / Self Care | Admitting: Vascular Surgery

## 2018-06-05 ENCOUNTER — Encounter: Admission: RE | Payer: Self-pay | Source: Home / Self Care

## 2018-06-05 SURGERY — DIALYSIS/PERMA CATHETER REMOVAL
Anesthesia: LOCAL

## 2018-06-10 ENCOUNTER — Other Ambulatory Visit (INDEPENDENT_AMBULATORY_CARE_PROVIDER_SITE_OTHER): Payer: Self-pay | Admitting: Vascular Surgery

## 2018-06-10 ENCOUNTER — Telehealth (INDEPENDENT_AMBULATORY_CARE_PROVIDER_SITE_OTHER): Payer: Self-pay

## 2018-06-10 ENCOUNTER — Encounter (INDEPENDENT_AMBULATORY_CARE_PROVIDER_SITE_OTHER): Payer: Self-pay

## 2018-06-10 NOTE — Telephone Encounter (Signed)
Spoke with the patient and gave him his day and time for his permcath removal. Of 06/17/2018 with a 11:45 am arrival time. This information will be mailed out.

## 2018-06-17 ENCOUNTER — Ambulatory Visit
Admission: RE | Admit: 2018-06-17 | Discharge: 2018-06-17 | Disposition: A | Discharge status: Home / Self Care | Payer: Medicare Other | Attending: Vascular Surgery | Admitting: Vascular Surgery

## 2018-06-17 ENCOUNTER — Encounter
Admission: RE | Disposition: A | Discharge status: Home / Self Care | Payer: Self-pay | Source: Home / Self Care | Attending: Vascular Surgery

## 2018-06-17 ENCOUNTER — Other Ambulatory Visit: Payer: Self-pay

## 2018-06-17 DIAGNOSIS — Z885 Allergy status to narcotic agent status: Secondary | ICD-10-CM | POA: Diagnosis not present

## 2018-06-17 DIAGNOSIS — M109 Gout, unspecified: Secondary | ICD-10-CM | POA: Insufficient documentation

## 2018-06-17 DIAGNOSIS — F329 Major depressive disorder, single episode, unspecified: Secondary | ICD-10-CM | POA: Insufficient documentation

## 2018-06-17 DIAGNOSIS — I120 Hypertensive chronic kidney disease with stage 5 chronic kidney disease or end stage renal disease: Secondary | ICD-10-CM | POA: Diagnosis not present

## 2018-06-17 DIAGNOSIS — N186 End stage renal disease: Secondary | ICD-10-CM

## 2018-06-17 DIAGNOSIS — Z452 Encounter for adjustment and management of vascular access device: Secondary | ICD-10-CM | POA: Diagnosis not present

## 2018-06-17 DIAGNOSIS — Z992 Dependence on renal dialysis: Secondary | ICD-10-CM | POA: Insufficient documentation

## 2018-06-17 HISTORY — PX: DIALYSIS/PERMA CATHETER REMOVAL: CATH118289

## 2018-06-17 SURGERY — DIALYSIS/PERMA CATHETER REMOVAL
Anesthesia: LOCAL

## 2018-06-17 MED ORDER — LIDOCAINE-EPINEPHRINE (PF) 1 %-1:200000 IJ SOLN
INTRAMUSCULAR | Status: DC | PRN
  Administered 2018-06-17: 20 mL

## 2018-06-17 SURGICAL SUPPLY — 3 items
FORCEPS HALSTEAD CVD 5IN STRL (INSTRUMENTS) ×1 IMPLANT
PREP CHG 10.5 TEAL (MISCELLANEOUS) ×2 IMPLANT
TRAY LACERAT/PLASTIC (MISCELLANEOUS) ×1 IMPLANT

## 2018-06-17 NOTE — Op Note (Signed)
Operative Note     Preoperative diagnosis:   1. ESRD with functional permanent access  Postoperative diagnosis:  1. ESRD with functional permanent access  Procedure:  Removal of right jugular Permcath  Surgeon:  Leotis Pain, MD  Anesthesia:  Local  EBL:  Minimal  Indication for the Procedure:  The patient has a functional permanent dialysis access and no longer needs their permcath.  This can be removed.  Risks and benefits are discussed and informed consent is obtained.  Description of the Procedure:  The patient's right neck, chest and existing catheter were sterilely prepped and draped. The area around the catheter was anesthetized copiously with 1% lidocaine. The catheter was dissected out with curved hemostats until the cuff was freed from the surrounding fibrous sheath. The fiber sheath was transected, and the catheter was then removed in its entirety using gentle traction. Pressure was held and sterile dressings were placed. The patient tolerated the procedure well and was taken to the recovery room in stable condition.     Leotis Pain  06/17/2018, 12:20 PM This note was created with Dragon Medical transcription system. Any errors in dictation are purely unintentional.

## 2018-06-17 NOTE — H&P (Signed)
[facility] History & Physical Update  The patient was interviewed and re-examined.  The patient's previous History and Physical has been reviewed and is unchanged.  There is no change in the plan of care. We plan to proceed with the scheduled procedure.  Leotis Pain, MD  06/17/2018, 11:56 AM

## 2018-06-17 NOTE — Discharge Instructions (Signed)
Tunneled Catheter Removal, Care After Refer to this sheet in the next few weeks. These instructions provide you with information about caring for yourself after your procedure. Your health care provider may also give you more specific instructions. Your treatment has been planned according to current medical practices, but problems sometimes occur. Call your health care provider if you have any problems or questions after your procedure. What can I expect after the procedure? After the procedure, it is common to have:  Some mild redness, swelling, and pain around your catheter site.   Follow these instructions at home: Incision care   Check your removal site  every day for signs of infection. Check for:  More redness, swelling, or pain.  More fluid or blood.  Warmth.  Pus or a bad smell.  Follow instructions from your health care provider about how to take care of your removal site. Make sure you:  Wash your hands with soap and water before you change your bandages (dressings). If soap and water are not available, use hand sanitizer. Activity   Return to your normal activities as told by your health care provider. Ask your health care provider what activities are safe for you.  Do not lift anything that is heavier than 10 lb (4.5 kg) for 3 weeks or as long as told by your health care provider.  Contact a health care provider if:  You have more fluid or blood coming from your removal site  You have more redness, swelling, or pain at your incisions or around the area where your catheter was removed  Your removal site feel warm to the touch.  You feel unusually weak.  You feel nauseous..  Get help right away if  You have swelling in your arm, shoulder, neck, or face.  You develop chest pain.  You have difficulty breathing.  You feel dizzy or light-headed.  You have pus or a bad smell coming from your removal site  You have a fever.  You develop bleeding from your  removal site, and your bleeding does not stop. This information is not intended to replace advice given to you by your health care provider. Make sure you discuss any questions you have with your health care provider. Document Released: 03/27/2012 Document Revised: 12/12/2015 Document Reviewed: 01/04/2015 Elsevier Interactive Patient Education  2017 [place] Catheter Removal, Care After Refer to this sheet in the next few weeks. These instructions provide you with information about caring for yourself after your procedure. Your health care provider may also give you more specific instructions. Your treatment has been planned according to current medical practices, but problems sometimes occur. Call your health care provider if you have any problems or questions after your procedure. What can I expect after the procedure? After the procedure, it is common to have:  Some mild redness, swelling, and pain around your catheter site.   Follow these instructions at home: Incision care   Check your removal site  every day for signs of infection. Check for:  More redness, swelling, or pain.  More fluid or blood.  Warmth.  Pus or a bad smell.  Follow instructions from your health care provider about how to take care of your removal site. Make sure you:  Wash your hands with soap and water before you change your bandages (dressings). If soap and water are not available, use hand sanitizer. Activity   Return to your normal activities as told by your health care provider. Ask your health care  provider what activities are safe for you.  Do not lift anything that is heavier than 10 lb (4.5 kg) for 3 weeks or as long as told by your health care provider.  Contact a health care provider if:  You have more fluid or blood coming from your removal site  You have more redness, swelling, or pain at your incisions or around the area where your catheter was removed  Your removal site  feel warm to the touch.  You feel unusually weak.  You feel nauseous..  Get help right away if  You have swelling in your arm, shoulder, neck, or face.  You develop chest pain.  You have difficulty breathing.  You feel dizzy or light-headed.  You have pus or a bad smell coming from your removal site  You have a fever.  You develop bleeding from your removal site, and your bleeding does not stop. This information is not intended to replace advice given to you by your health care provider. Make sure you discuss any questions you have with your health care provider. Document Released: 03/27/2012 Document Revised: 12/12/2015 Document Reviewed: 01/04/2015 Elsevier Interactive Patient Education  2017 [place], Adult Taking care of your wound properly can help to prevent pain, infection, and scarring. It can also help your wound to heal more quickly. How to care for your wound Wound care      Follow instructions from your health care provider about how to take care of your wound. Make sure you: ? Wash your hands with soap and water before you change the bandage (dressing). If soap and water are not available, use hand sanitizer. ? Change your dressing as told by your health care provider. ? Leave stitches (sutures), skin glue, or adhesive strips in place. These skin closures may need to stay in place for 2 weeks or longer. If adhesive strip edges start to loosen and curl up, you may trim the loose edges. Do not remove adhesive strips completely unless your health care provider tells you to do that.  Check your wound area every day for signs of infection. Check for: ? Redness, swelling, or pain. ? Fluid or blood. ? Warmth. ? Pus or a bad smell.  Ask your health care provider if you should clean the wound with mild soap and water. Doing this may include: ? Using a clean towel to pat the wound dry after cleaning it. Do not rub or scrub the wound. ? Applying a cream  or ointment. Do this only as told by your health care provider. ? Covering the incision with a clean dressing.  Ask your health care provider when you can leave the wound uncovered.  Keep the dressing dry until your health care provider says it can be removed. Do not take baths, swim, use a hot tub, or do anything that would put the wound underwater until your health care provider approves. Ask your health care provider if you can take showers. You may only be allowed to take sponge baths. Medicines   If you were prescribed an antibiotic medicine, cream, or ointment, take or use the antibiotic as told by your health care provider. Do not stop taking or using the antibiotic even if your condition improves.  Take over-the-counter and prescription medicines only as told by your health care provider. If you were prescribed pain medicine, take it 30 or more minutes before you do any wound care or as told by your health care provider. General instructions  Return to your normal activities as told by your health care provider. Ask your health care provider what activities are safe.  Do not scratch or pick at the wound.  Do not use any products that contain nicotine or tobacco, such as cigarettes and e-cigarettes. These may delay wound healing. If you need help quitting, ask your health care provider.  Keep all follow-up visits as told by your health care provider. This is important.  Eat a diet that includes protein, vitamin A, vitamin C, and other nutrient-rich foods to help the wound heal. ? Foods rich in protein include meat, dairy, beans, nuts, and other sources. ? Foods rich in vitamin A include carrots and dark green, leafy vegetables. ? Foods rich in vitamin C include citrus, tomatoes, and other fruits and vegetables. ? Nutrient-rich foods have protein, carbohydrates, fat, vitamins, or minerals. Eat a variety of healthy foods including vegetables, fruits, and whole grains. Contact a health  care provider if:  You received a tetanus shot and you have swelling, severe pain, redness, or bleeding at the injection site.  Your pain is not controlled with medicine.  You have redness, swelling, or pain around the wound.  You have fluid or blood coming from the wound.  Your wound feels warm to the touch.  You have pus or a bad smell coming from the wound.  You have a fever or chills.  You are nauseous or you vomit.  You are dizzy. Get help right away if:  You have a red streak going away from your wound.  The edges of the wound open up and separate.  Your wound is bleeding, and the bleeding does not stop with gentle pressure.  You have a rash.  You faint.  You have trouble breathing. Summary  Always wash your hands with soap and water before changing your bandage (dressing).  To help with healing, eat foods that are rich in protein, vitamin A, vitamin C, and other nutrients.  Check your wound every day for signs of infection. Contact your health care provider if you suspect that your wound is infected. This information is not intended to replace advice given to you by your health care provider. Make sure you discuss any questions you have with your health care provider. Document Released: 01/18/2008 Document Revised: 05/22/2017 Document Reviewed: 10/26/2015 Elsevier Interactive Patient Education  2019 [employer].

## 2018-06-17 NOTE — H&P (Signed)
[facility] Admission History & Physical  MRN : [id number]  Patrick Downs is a 43 y.o. (May 10, 1975) male who presents with chief complaint of No chief complaint on file. Marland Kitchen  History of Present Illness: I am asked to evaluate the patient by the dialysis center. The patient was sent here because they have a nonfunctioning tunneled catheter and a functioning arm access.  The patient reports they're not been any problems with any of their dialysis runs. They are reporting good flows with good parameters at dialysis.  Patient denies pain or tenderness overlying the access.  There is no pain with dialysis.  The patient denies hand pain or finger pain consistent with steal syndrome.  No fevers or chills while on dialysis.   No current facility-administered medications for this encounter.     Past Medical History:  Diagnosis Date  . Depression   . Dialysis patient ([place])   . ESRD (end stage renal disease) on dialysis ([place])   . Failure to thrive in adult   . Gout   . Guillain Barr syndrome ([place])   . HTN (hypertension)   . Kidney failure   . Pneumonia   . Renal insufficiency   . Respiratory failure [facility])     Past Surgical History:  Procedure Laterality Date  . A/V FISTULAGRAM Right 01/07/2018   Procedure: A/V FISTULAGRAM;  Surgeon: Algernon Huxley, MD;  Location: [facility];  Service: Cardiovascular;  Laterality: Right;  . AMPUTATION TOE Right 06/08/2017   Procedure: AMPUTATION TOE RIGHT FIFTH TOE;  Surgeon: Samara Deist, DPM;  Location: [facility];  Service: Podiatry;  Laterality: Right;  . AMPUTATION TOE Left 05/17/2018   Procedure: RAY LEFT 5TH;  Surgeon: Samara Deist, DPM;  Location: [facility];  Service: Podiatry;  Laterality: Left;  . AV FISTULA PLACEMENT     x5      2 graphs  . PARATHYROIDECTOMY    . RENAL BIOPSY    . REVISON OF ARTERIOVENOUS FISTULA Right 02/07/2018   Procedure: REVISON OF ARTERIOVENOUS FISTULA;  Surgeon: Algernon Huxley, MD;   Location: [facility];  Service: Vascular;  Laterality: Right;  . TEE WITHOUT CARDIOVERSION N/A 01/22/2018   Procedure: TRANSESOPHAGEAL ECHOCARDIOGRAM (TEE);  Surgeon: Dionisio David, MD;  Location: [facility];  Service: Cardiovascular;  Laterality: N/A;  . tonsiilectomy    . tracheotomy      Social History Social History   Tobacco Use  . Smoking status: Never Smoker  . Smokeless tobacco: Never Used  Substance Use Topics  . Alcohol use: No  . Drug use: Yes    Types: Marijuana    Comment: last time was June    Family History Family History  Problem Relation Age of Onset  . Diabetes Mellitus II Father   . Kidney disease Father   . Kidney failure Paternal Grandfather   . Prostate cancer Neg Hx   . Kidney cancer Neg Hx   . Bladder Cancer Neg Hx     No family history of bleeding or clotting disorders, autoimmune disease or porphyria  Allergies  Allergen Reactions  . Ondansetron Other (See Comments)    Stomach pain   . Minoxidil Other (See Comments)    "put fluid around my heart", PERICARDIAL EFFUSION  . Morphine And Related Other (See Comments)    Aggressive   . Omnipaque [Iohexol] Itching and Other (See Comments)    Rigors on one occasion, widespread itching on a separate occasion (resolved with Benadryl), tremors  REVIEW OF SYSTEMS (Negative unless checked)  Constitutional: [] Weight loss  [] Fever  [] Chills Cardiac: [] Chest pain   [] Chest pressure   [] Palpitations   [] Shortness of breath when laying flat   [] Shortness of breath at rest   [x] Shortness of breath with exertion. Vascular:  [] Pain in legs with walking   [] Pain in legs at rest   [] Pain in legs when laying flat   [] Claudication   [] Pain in feet when walking  [] Pain in feet at rest  [] Pain in feet when laying flat   [] History of DVT   [] Phlebitis   [] Swelling in legs   [] Varicose veins   [] Non-healing ulcers Pulmonary:   [] Uses home oxygen   [] Productive cough   [] Hemoptysis   [] Wheeze  [] COPD    [] Asthma Neurologic:  [] Dizziness  [] Blackouts   [] Seizures   [] History of stroke   [] History of TIA  [] Aphasia   [] Temporary blindness   [] Dysphagia   [] Weakness or numbness in arms   [] Weakness or numbness in legs Musculoskeletal:  [x] Arthritis   [] Joint swelling   [] Joint pain   [] Low back pain Hematologic:  [] Easy bruising  [] Easy bleeding   [] Hypercoagulable state   [] Anemic  [] Hepatitis Gastrointestinal:  [] Blood in stool   [] Vomiting blood  [] Gastroesophageal reflux/heartburn   [] Difficulty swallowing. Genitourinary:  [x] Chronic kidney disease   [] Difficult urination  [] Frequent urination  [] Burning with urination   [] Blood in urine Skin:  [] Rashes   [] Ulcers   [] Wounds Psychological:  [] History of anxiety   []  History of major depression.  Physical Examination  Vitals:   06/17/18 1103  BP: (!) 144/102  Pulse: 91  Resp: 13  Temp: 98.7 F (37.1 C)  TempSrc: Oral  SpO2: 96%   There is no height or weight on file to calculate BMI. Gen: WD/WN, NAD Head: [place]/AT, No temporalis wasting.  Ear/Nose/Throat: Hearing grossly intact, nares w/o erythema or drainage, oropharynx w/o Erythema/Exudate,  Eyes: Conjunctiva clear, sclera non-icteric Neck: Trachea midline.  No JVD.  Pulmonary:  Good air movement, respirations not labored, no use of accessory muscles.  Cardiac: RRR, normal S1, S2. Vascular: good thrill in access Vessel Right Left  Radial Palpable Palpable   Musculoskeletal: M/S 5/5 throughout.  Extremities without ischemic changes.  Foot drop and hand contractures present Neurologic: Sensation grossly intact in extremities.  Symmetrical.  Speech is fluent. Motor exam as listed above. Psychiatric: Judgment intact, Mood & affect appropriate for pt's clinical situation. Dermatologic: No rashes or ulcers noted.  No cellulitis or open wounds.    CBC Lab Results  Component Value Date   WBC 6.3 02/06/2018   HGB 10.2 (L) 05/17/2018   HCT 30.0 (L) 05/17/2018   MCV 93.9 02/06/2018    PLT 169 02/06/2018    BMET    Component Value Date/Time   NA 141 05/17/2018 0911   NA 136 08/22/2014 0653   K 4.6 05/17/2018 0911   K 5.1 08/22/2014 0653   CL 101 02/06/2018 1141   CL 95 (L) 08/22/2014 0653   CO2 23 02/06/2018 1141   CO2 27 08/22/2014 0653   GLUCOSE 96 05/17/2018 0911   GLUCOSE 176 (H) 08/22/2014 0653   BUN 60 (H) 02/06/2018 1141   BUN 71 (H) 08/22/2014 0653   CREATININE 13.96 (H) 02/06/2018 1141   CREATININE 8.87 (H) 08/22/2014 0653   CALCIUM 8.3 (L) 02/06/2018 1141   CALCIUM 7.1 (L) 08/22/2014 0653   GFRNONAA 4 (L) 02/06/2018 1141   GFRNONAA 7 (L) 08/22/2014 0347  GFRAA 4 (L) 02/06/2018 1141   GFRAA 8 (L) 08/22/2014 0653   CrCl cannot be calculated (Patient's most recent lab result is older than the maximum 21 days allowed.).  COAG Lab Results  Component Value Date   INR 1.13 02/06/2018   INR 1.46 08/31/2014   INR 1.23 08/28/2014    Radiology No results found.  Assessment/Plan 1.  Complication dialysis device:  Patient's Tunneled catheter is not being used. The patient has an extremity access that is functioning well. Therefore, the patient will undergo removal of the tunneled catheter under local anesthesia.  The risks and benefits were described to the patient.  All questions were answered.  The patient agrees to proceed with angiography and intervention. Potassium will be drawn to ensure that it is an appropriate level prior to performing intervention. 2.  End-stage renal disease requiring hemodialysis:  Patient will continue dialysis therapy without further interruption  3.  Hypertension:  Patient will continue medical management; nephrology is following no changes in oral medications.      Leotis Pain, MD  06/17/2018 12:22 PM

## 2018-06-18 ENCOUNTER — Encounter: Payer: Self-pay | Admitting: Vascular Surgery

## 2018-06-26 ENCOUNTER — Other Ambulatory Visit: Payer: Self-pay

## 2018-06-26 ENCOUNTER — Ambulatory Visit: Payer: Medicare Other | Attending: Anesthesiology | Admitting: Anesthesiology

## 2018-06-26 ENCOUNTER — Encounter: Payer: Self-pay | Admitting: Anesthesiology

## 2018-06-26 VITALS — BP 137/103 | HR 111 | Temp 98.0°F | Resp 20 | Ht 74.0 in | Wt 258.0 lb

## 2018-06-26 DIAGNOSIS — M545 Low back pain, unspecified: Secondary | ICD-10-CM

## 2018-06-26 DIAGNOSIS — M47816 Spondylosis without myelopathy or radiculopathy, lumbar region: Secondary | ICD-10-CM | POA: Insufficient documentation

## 2018-06-26 DIAGNOSIS — G894 Chronic pain syndrome: Secondary | ICD-10-CM | POA: Insufficient documentation

## 2018-06-26 DIAGNOSIS — N186 End stage renal disease: Secondary | ICD-10-CM | POA: Insufficient documentation

## 2018-06-26 DIAGNOSIS — Z992 Dependence on renal dialysis: Secondary | ICD-10-CM | POA: Insufficient documentation

## 2018-06-26 DIAGNOSIS — F1190 Opioid use, unspecified, uncomplicated: Secondary | ICD-10-CM | POA: Diagnosis present

## 2018-06-26 DIAGNOSIS — R29898 Other symptoms and signs involving the musculoskeletal system: Secondary | ICD-10-CM | POA: Diagnosis not present

## 2018-06-26 DIAGNOSIS — M5136 Other intervertebral disc degeneration, lumbar region: Secondary | ICD-10-CM | POA: Diagnosis not present

## 2018-06-26 DIAGNOSIS — G8929 Other chronic pain: Secondary | ICD-10-CM | POA: Insufficient documentation

## 2018-06-26 DIAGNOSIS — G610 Guillain-Barre syndrome: Secondary | ICD-10-CM | POA: Insufficient documentation

## 2018-06-26 MED ORDER — OXYCODONE-ACETAMINOPHEN 5-325 MG PO TABS: 1.0000 | ORAL_TABLET | Freq: Four times a day (QID) | ORAL | 0 refills | Status: AC | PRN

## 2018-06-26 NOTE — Progress Notes (Signed)
Nursing Pain Medication Assessment:  Safety precautions to be maintained throughout the outpatient stay will include: orient to surroundings, keep bed in low position, maintain call bell within reach at all times, provide assistance with transfer out of bed and ambulation.  Medication Inspection Compliance: Pill count conducted under aseptic conditions, in front of the patient. Neither the pills nor the bottle was removed from the patient's sight at any time. Once count was completed pills were immediately returned to the patient in their original bottle.  Medication: Oxycodone/APAP Pill/Patch Count: 2 of 120 pills remain Pill/Patch Appearance: Markings consistent with prescribed medication Bottle Appearance: Standard pharmacy container. Clearly labeled. Filled Date: 02 / 05 / 2020 Last Medication intake:  Today

## 2018-06-27 NOTE — Progress Notes (Signed)
Subjective:  Patient ID: Patrick Downs, male    DOB: Sep 23, 1975  Age: 43 y.o. MRN: [id number]  CC: Back Pain (low)   Procedure: None  HPI SAIVON PROWSE presents for reevaluation.  He was last seen 2 months ago and is been doing well with his medication management.  No new changes are reported since his January eval.  The quality characteristic and distribution of his low back pain to been stable in nature.  He denies any other change to lower extremity strength as compared to his baseline.  He is taking his medications as prescribed and he denies any diverting or illicit use.  Based on his narcotic assessment sheet he is continuing to derive functional lifestyle improvement with the medicine and no side effects are reported.  Ttable  Outpatient Medications Prior to Visit  Medication Sig Dispense Refill  . acetaminophen (TYLENOL) 500 MG tablet Take 1,000-1,500 mg by mouth 2 (two) times daily as needed for moderate pain or headache.    . calcium acetate (PHOSLO) 667 MG capsule Take 3 capsules (2,001 mg total) by mouth 3 (three) times daily with meals. (Patient taking differently: Take 1,334-2,001 mg by mouth 3 (three) times daily with meals. ) 1 capsule 0  . gabapentin (NEURONTIN) 300 MG capsule Take 300 mg by mouth at bedtime.     . midodrine (PROAMATINE) 5 MG tablet Take 5 mg by mouth See admin instructions. Take 2 tablets (10 mg) by mouth before dialysis on Tuesdays, Thursdays, & Saturday; Take 1 tablet (5 mg) by mouth in the evening on  Tuesdays, Thursdays, & Saturdays. On non-dialysis days take 1 tablet (5 mg) by mouth every 8 hours.    . sildenafil (REVATIO) 20 MG tablet Take 20-100 mg by mouth as needed (for ED).     Marland Kitchen vancomycin IVPB Inject into the vein. Gets three days a week at dialysis    . oxyCODONE-acetaminophen (PERCOCET) 5-325 MG tablet Take 1 tablet by mouth every 4 (four) hours as needed for severe pain. (Patient taking differently: Take 1 tablet by mouth 4 (four) times  daily. ) 20 tablet 0   No facility-administered medications prior to visit.     Review of Systems CNS: No confusion or sedation Cardiac: No angina or palpitations GI: No abdominal pain or constipation Constitutional: No nausea vomiting fevers or chills  Objective:  BP (!) 137/103   Pulse (!) 111   Temp 98 F (36.7 C)   Resp 20   Ht 6\' 2"  (1.88 m)   Wt 258 lb (117 kg)   SpO2 93%   BMI 33.13 kg/m    BP Readings from Last 3 Encounters:  06/26/18 (!) 137/103  06/17/18 (!) 143/107  05/30/18 (!) 116/111     Wt Readings from Last 3 Encounters:  06/26/18 258 lb (117 kg)  05/30/18 251 lb 1.7 oz (113.9 kg)  05/17/18 238 lb (108 kg)     Physical Exam Pt is alert and oriented PERRL EOMI HEART IS RRR no murmur or rub LCTA no wheezing or rales MUSCULOSKELETAL reveals some paraspinous muscle tenderness but no overt trigger points.  His lower extremity muscle tone and bulk are at baseline as well.  Labs  Lab Results  Component Value Date   HGBA1C 5.5 08/16/2017   Lab Results  Component Value Date   CREATININE 13.96 (H) 02/06/2018    -------------------------------------------------------------------------------------------------------------------- Lab Results  Component Value Date   WBC 6.3 02/06/2018   HGB 10.2 (L) 05/17/2018   HCT 30.0 (  L) 05/17/2018   PLT 169 02/06/2018   GLUCOSE 96 05/17/2018   TRIG 673 (H) 08/21/2014   ALT 27 01/19/2018   AST 28 01/19/2018   NA 141 05/17/2018   K 4.6 05/17/2018   CL 101 02/06/2018   CREATININE 13.96 (H) 02/06/2018   BUN 60 (H) 02/06/2018   CO2 23 02/06/2018   TSH 5.560 (H) 08/27/2014   INR 1.13 02/06/2018   HGBA1C 5.5 08/16/2017    --------------------------------------------------------------------------------------------------------------------- No results found.   Assessment & Plan:   Jahden was seen today for back pain.  Diagnoses and all orders for this visit:  DDD (degenerative disc disease),  lumbar  Chronic bilateral low back pain without sciatica  Facet arthritis of lumbar region  Weakness of both lower extremities  Chronic, continuous use of opioids  Chronic pain syndrome  ESRD on dialysis ([place])  Guillain-Barre syndrome ([place])  Other orders -     oxyCODONE-acetaminophen (PERCOCET) 5-325 MG tablet; Take 1 tablet by mouth every 6 (six) hours as needed for up to 30 days for severe pain. -     oxyCODONE-acetaminophen (PERCOCET) 5-325 MG tablet; Take 1 tablet by mouth every 6 (six) hours as needed for up to 30 days for moderate pain or severe pain.        ----------------------------------------------------------------------------------------------------------------------  Problem List Items Addressed This Visit      Unprioritized   ESRD on dialysis [facility])    Other Visit Diagnoses    DDD (degenerative disc disease), lumbar    -  Primary   Relevant Medications   oxyCODONE-acetaminophen (PERCOCET) 5-325 MG tablet   oxyCODONE-acetaminophen (PERCOCET) 5-325 MG tablet (Start on 07/27/2018)   Chronic bilateral low back pain without sciatica       Relevant Medications   oxyCODONE-acetaminophen (PERCOCET) 5-325 MG tablet   oxyCODONE-acetaminophen (PERCOCET) 5-325 MG tablet (Start on 07/27/2018)   Facet arthritis of lumbar region       Relevant Medications   oxyCODONE-acetaminophen (PERCOCET) 5-325 MG tablet   oxyCODONE-acetaminophen (PERCOCET) 5-325 MG tablet (Start on 07/27/2018)   Weakness of both lower extremities       Chronic, continuous use of opioids       Chronic pain syndrome       Guillain-Barre syndrome ([place])            ----------------------------------------------------------------------------------------------------------------------  1. DDD (degenerative disc disease), lumbar We will defer on any changes today.  He reports that he is doing reasonably well and his pain is under control with his current medication regimen.  He is trying to do some  stretching strengthening exercises as tolerated.  2. Chronic bilateral low back pain without sciatica As above  3. Facet arthritis of lumbar region   4. Weakness of both lower extremities   5. Chronic, continuous use of opioids We have reviewed the [facility] practitioner database information and it is appropriate.  We will give refills today dated for March 5 and April 4.  He is scheduled for repeat eval in May  6. Chronic pain syndrome As above  7. ESRD on dialysis [facility]) Continue follow-up with his primary care physicians.  8. Guillain-Barre syndrome ([place])     ----------------------------------------------------------------------------------------------------------------------  I have changed Norva Riffle. Knaak's oxyCODONE-acetaminophen. I am also having him start on oxyCODONE-acetaminophen. Additionally, I am having him maintain his calcium acetate, gabapentin, sildenafil, acetaminophen, midodrine, and vancomycin.   Meds ordered this encounter  Medications  . oxyCODONE-acetaminophen (PERCOCET) 5-325 MG tablet    Sig: Take 1 tablet by mouth every 6 (  six) hours as needed for up to 30 days for severe pain.    Dispense:  120 tablet    Refill:  0  . oxyCODONE-acetaminophen (PERCOCET) 5-325 MG tablet    Sig: Take 1 tablet by mouth every 6 (six) hours as needed for up to 30 days for moderate pain or severe pain.    Dispense:  120 tablet    Refill:  0    30 day supply   Patient's Medications  New Prescriptions   OXYCODONE-ACETAMINOPHEN (PERCOCET) 5-325 MG TABLET    Take 1 tablet by mouth every 6 (six) hours as needed for up to 30 days for moderate pain or severe pain.  Previous Medications   ACETAMINOPHEN (TYLENOL) 500 MG TABLET    Take 1,000-1,500 mg by mouth 2 (two) times daily as needed for moderate pain or headache.   CALCIUM ACETATE (PHOSLO) 667 MG CAPSULE    Take 3 capsules (2,001 mg total) by mouth 3 (three) times daily with meals.   GABAPENTIN (NEURONTIN) 300 MG  CAPSULE    Take 300 mg by mouth at bedtime.    MIDODRINE (PROAMATINE) 5 MG TABLET    Take 5 mg by mouth See admin instructions. Take 2 tablets (10 mg) by mouth before dialysis on Tuesdays, Thursdays, & Saturday; Take 1 tablet (5 mg) by mouth in the evening on  Tuesdays, Thursdays, & Saturdays. On non-dialysis days take 1 tablet (5 mg) by mouth every 8 hours.   SILDENAFIL (REVATIO) 20 MG TABLET    Take 20-100 mg by mouth as needed (for ED).    VANCOMYCIN IVPB    Inject into the vein. Gets three days a week at dialysis  Modified Medications   Modified Medication Previous Medication   OXYCODONE-ACETAMINOPHEN (PERCOCET) 5-325 MG TABLET oxyCODONE-acetaminophen (PERCOCET) 5-325 MG tablet      Take 1 tablet by mouth every 6 (six) hours as needed for up to 30 days for severe pain.    Take 1 tablet by mouth every 4 (four) hours as needed for severe pain.  Discontinued Medications   No medications on file   ----------------------------------------------------------------------------------------------------------------------  Follow-up: Return in about 2 months (around 08/26/2018) for evaluation, med refill.    Molli Barrows, MD

## 2018-08-27 ENCOUNTER — Ambulatory Visit: Payer: Medicare Other | Attending: Anesthesiology | Admitting: Anesthesiology

## 2018-08-27 ENCOUNTER — Other Ambulatory Visit: Payer: Self-pay

## 2018-08-27 ENCOUNTER — Encounter: Payer: Self-pay | Admitting: Anesthesiology

## 2018-08-27 DIAGNOSIS — M5136 Other intervertebral disc degeneration, lumbar region: Secondary | ICD-10-CM

## 2018-08-27 DIAGNOSIS — Z992 Dependence on renal dialysis: Secondary | ICD-10-CM

## 2018-08-27 DIAGNOSIS — M545 Low back pain: Secondary | ICD-10-CM

## 2018-08-27 DIAGNOSIS — G610 Guillain-Barre syndrome: Secondary | ICD-10-CM

## 2018-08-27 DIAGNOSIS — G894 Chronic pain syndrome: Secondary | ICD-10-CM

## 2018-08-27 DIAGNOSIS — F1190 Opioid use, unspecified, uncomplicated: Secondary | ICD-10-CM

## 2018-08-27 DIAGNOSIS — M51369 Other intervertebral disc degeneration, lumbar region without mention of lumbar back pain or lower extremity pain: Secondary | ICD-10-CM

## 2018-08-27 DIAGNOSIS — M47816 Spondylosis without myelopathy or radiculopathy, lumbar region: Secondary | ICD-10-CM | POA: Diagnosis not present

## 2018-08-27 DIAGNOSIS — R29898 Other symptoms and signs involving the musculoskeletal system: Secondary | ICD-10-CM | POA: Diagnosis not present

## 2018-08-27 DIAGNOSIS — G8929 Other chronic pain: Secondary | ICD-10-CM

## 2018-08-27 DIAGNOSIS — I770 Arteriovenous fistula, acquired: Secondary | ICD-10-CM

## 2018-08-27 DIAGNOSIS — N186 End stage renal disease: Secondary | ICD-10-CM

## 2018-08-27 MED ORDER — OXYCODONE HCL 5 MG PO TABS: 5.0000 mg | ORAL_TABLET | Freq: Four times a day (QID) | ORAL | 0 refills | Status: AC | PRN

## 2018-09-09 ENCOUNTER — Encounter: Payer: Self-pay | Admitting: Anesthesiology

## 2018-09-10 ENCOUNTER — Other Ambulatory Visit
Admission: RE | Admit: 2018-09-10 | Discharge: 2018-09-10 | Disposition: A | Discharge status: Home / Self Care | Payer: Medicare Other | Source: Ambulatory Visit | Attending: Nephrology | Admitting: Nephrology

## 2018-09-10 DIAGNOSIS — E875 Hyperkalemia: Secondary | ICD-10-CM | POA: Insufficient documentation

## 2018-09-10 LAB — POTASSIUM: Potassium: 4.9 mmol/L (ref 3.5–5.1)

## 2018-09-25 ENCOUNTER — Ambulatory Visit: Payer: Medicare Other | Attending: Anesthesiology | Admitting: Anesthesiology

## 2018-09-25 ENCOUNTER — Encounter: Payer: Self-pay | Admitting: Anesthesiology

## 2018-09-25 ENCOUNTER — Other Ambulatory Visit: Payer: Self-pay

## 2018-09-25 DIAGNOSIS — G8929 Other chronic pain: Secondary | ICD-10-CM

## 2018-09-25 DIAGNOSIS — R29898 Other symptoms and signs involving the musculoskeletal system: Secondary | ICD-10-CM

## 2018-09-25 DIAGNOSIS — M545 Low back pain: Secondary | ICD-10-CM | POA: Diagnosis not present

## 2018-09-25 DIAGNOSIS — M47816 Spondylosis without myelopathy or radiculopathy, lumbar region: Secondary | ICD-10-CM | POA: Diagnosis not present

## 2018-09-25 DIAGNOSIS — M5136 Other intervertebral disc degeneration, lumbar region: Secondary | ICD-10-CM | POA: Diagnosis not present

## 2018-09-25 DIAGNOSIS — N186 End stage renal disease: Secondary | ICD-10-CM

## 2018-09-25 DIAGNOSIS — F1190 Opioid use, unspecified, uncomplicated: Secondary | ICD-10-CM

## 2018-09-25 DIAGNOSIS — G894 Chronic pain syndrome: Secondary | ICD-10-CM

## 2018-09-25 DIAGNOSIS — Z992 Dependence on renal dialysis: Secondary | ICD-10-CM

## 2018-09-25 DIAGNOSIS — G610 Guillain-Barre syndrome: Secondary | ICD-10-CM

## 2018-09-25 MED ORDER — OXYCODONE-ACETAMINOPHEN 5-325 MG PO TABS: 1.0000 | ORAL_TABLET | Freq: Four times a day (QID) | ORAL | 0 refills | Status: AC | PRN

## 2018-09-25 NOTE — Progress Notes (Signed)
Virtual Visit via Telephone Note  I connected with Patrick Downs on 09/25/18 at  1:15 PM EDT by telephone and verified that I am speaking with the correct person using two identifiers.  Location: Patient: Home  Provider: Pain control center   I discussed the limitations, risks, security and privacy concerns of performing an evaluation and management service by telephone and the availability of in person appointments. I also discussed with the patient that there may be a patient responsible charge related to this service. The patient expressed understanding and agreed to proceed.   History of Present Illness: I spoke with Patrick Downs over the phone today.  We were unable to do video voice conferencing.  He reports that he has been doing reasonably well in regards to the care of his low back pain and leg pain.  The quality characteristic and distribution of the pain have remained stable.  He mentions that the recent switch from oxycodone with acetaminophen to the oxycodone alone he feels has attributed to a mild increase in swelling to the lower legs.  He would like to add the acetaminophen back into his prescription.  Otherwise he is in his usual state of health with no new changes in lower extremity strength or function reported today.  He also reports that he is getting good relief with the oxycodone.  He denies any diverting or illicit use and states that overall he is functionally better sleeping better with better pain control on the medications.  Unfortunately he is unable to tolerate anti-inflammatory secondary to his renal disease.   Observations/Objective:  Current Outpatient Medications:  .  acetaminophen (TYLENOL) 500 MG tablet, Take 1,000-1,500 mg by mouth 2 (two) times daily as needed for moderate pain or headache., Disp: , Rfl:  .  calcium acetate (PHOSLO) 667 MG capsule, Take 3 capsules (2,001 mg total) by mouth 3 (three) times daily with meals. (Patient taking differently: Take  1,334-2,001 mg by mouth 3 (three) times daily with meals. ), Disp: 1 capsule, Rfl: 0 .  gabapentin (NEURONTIN) 300 MG capsule, Take 300 mg by mouth at bedtime. , Disp: , Rfl:  .  midodrine (PROAMATINE) 5 MG tablet, Take 5 mg by mouth See admin instructions. Take 2 tablets (10 mg) by mouth before dialysis on Tuesdays, Thursdays, & Saturday; Take 1 tablet (5 mg) by mouth in the evening on  Tuesdays, Thursdays, & Saturdays. On non-dialysis days take 1 tablet (5 mg) by mouth every 8 hours., Disp: , Rfl:  .  oxyCODONE (ROXICODONE) 5 MG immediate release tablet, Take 1 tablet (5 mg total) by mouth every 6 (six) hours as needed for up to 30 days for severe pain., Disp: 120 tablet, Rfl: 0 .  [START ON 09/26/2018] oxyCODONE-acetaminophen (PERCOCET) 5-325 MG tablet, Take 1 tablet by mouth every 6 (six) hours as needed for up to 30 days for severe pain., Disp: 120 tablet, Rfl: 0 .  [START ON 10/26/2018] oxyCODONE-acetaminophen (PERCOCET) 5-325 MG tablet, Take 1 tablet by mouth every 6 (six) hours as needed for up to 30 days for severe pain., Disp: 120 tablet, Rfl: 0 .  sildenafil (REVATIO) 20 MG tablet, Take 20-100 mg by mouth as needed (for ED). , Disp: , Rfl:  .  vancomycin IVPB, Inject into the vein. Gets three days a week at dialysis, Disp: , Rfl:   Assessment and Plan: 1. DDD (degenerative disc disease), lumbar   2. Chronic bilateral low back pain without sciatica   3. Facet arthritis of lumbar region  4. Weakness of both lower extremities   5. Chronic, continuous use of opioids   6. Chronic pain syndrome   7. ESRD on dialysis ([place])   8. Guillain-Barre syndrome [facility])   Based on our discussion today and upon review of the [facility] practitioner database information, I am going to refill his medications.  I am going to make this for Percocet 5 mg tablets to be taken 4 times a day dated for June 4 and July 4 with scheduled return to clinic in 2 months.  Should he have any problems in the meantime he is  instructed to contact us at the pain control center.  He is to continue follow-up with his primary care physicians for his baseline medical care.   Follow Up Instructions:    I discussed the assessment and treatment plan with the patient. The patient was provided an opportunity to ask questions and all were answered. The patient agreed with the plan and demonstrated an understanding of the instructions.   The patient was advised to call back or seek an in-person evaluation if the symptoms worsen or if the condition fails to improve as anticipated.  I provided 30 minutes of non-face-to-face time during this encounter.   Molli Barrows, MD

## 2018-11-20 ENCOUNTER — Encounter (INDEPENDENT_AMBULATORY_CARE_PROVIDER_SITE_OTHER): Payer: Medicare Other

## 2018-11-20 ENCOUNTER — Ambulatory Visit (INDEPENDENT_AMBULATORY_CARE_PROVIDER_SITE_OTHER): Payer: Medicare Other | Admitting: Nurse Practitioner

## 2018-11-26 ENCOUNTER — Telehealth: Payer: Self-pay | Admitting: Anesthesiology

## 2018-11-26 ENCOUNTER — Ambulatory Visit: Payer: Medicare Other | Attending: Anesthesiology | Admitting: Anesthesiology

## 2018-11-26 ENCOUNTER — Encounter: Payer: Self-pay | Admitting: Anesthesiology

## 2018-11-26 ENCOUNTER — Other Ambulatory Visit: Payer: Self-pay

## 2018-11-26 DIAGNOSIS — M545 Low back pain: Secondary | ICD-10-CM

## 2018-11-26 DIAGNOSIS — N186 End stage renal disease: Secondary | ICD-10-CM

## 2018-11-26 DIAGNOSIS — Z992 Dependence on renal dialysis: Secondary | ICD-10-CM

## 2018-11-26 DIAGNOSIS — M5136 Other intervertebral disc degeneration, lumbar region: Secondary | ICD-10-CM

## 2018-11-26 DIAGNOSIS — R29898 Other symptoms and signs involving the musculoskeletal system: Secondary | ICD-10-CM

## 2018-11-26 DIAGNOSIS — G894 Chronic pain syndrome: Secondary | ICD-10-CM

## 2018-11-26 DIAGNOSIS — M47816 Spondylosis without myelopathy or radiculopathy, lumbar region: Secondary | ICD-10-CM

## 2018-11-26 DIAGNOSIS — G8929 Other chronic pain: Secondary | ICD-10-CM

## 2018-11-26 DIAGNOSIS — F1190 Opioid use, unspecified, uncomplicated: Secondary | ICD-10-CM

## 2018-11-26 DIAGNOSIS — G610 Guillain-Barre syndrome: Secondary | ICD-10-CM

## 2018-11-26 MED ORDER — OXYCODONE HCL 5 MG PO TABS: 5.0000 mg | ORAL_TABLET | Freq: Four times a day (QID) | ORAL | 0 refills | Status: AC | PRN

## 2018-11-26 NOTE — Telephone Encounter (Signed)
Patient did not understand that he was supposed to call clinic for appointment. I scheduled him for 12-18-18. Virtual Med Mgmt. He is out of Oxycodone today. Can Dr. Andree Elk call in 1 month script to last patient until his appt. Please let patient know status of this.

## 2018-11-26 NOTE — Progress Notes (Signed)
Virtual Visit via Telephone Note  I connected with Patrick Downs on 11/26/18 at  2:45 PM EDT by telephone and verified that I am speaking with the correct person using two identifiers.  Location: Patient: Home Provider: Pain control center   I discussed the limitations, risks, security and privacy concerns of performing an evaluation and management service by telephone and the availability of in person appointments. I also discussed with the patient that there may be a patient responsible charge related to this service. The patient expressed understanding and agreed to proceed.   History of Present Illness: I spoke with Patrick Downs via telephone today.  He has been doing reasonably well with his overall pain control.  The quality characteristic distribution of this is been stable.  No new changes are reported.  He takes his medications as prescribed and these work well for him based on our discussion today.  Based on this review he continues to derive good functional lifestyle provement with the medicines and unfortunately has failed more conservative therapy.  He denies any side effects with the medications    Observations/Objective:  Current Outpatient Medications:  .  acetaminophen (TYLENOL) 500 MG tablet, Take 1,000-1,500 mg by mouth 2 (two) times daily as needed for moderate pain or headache., Disp: , Rfl:  .  calcium acetate (PHOSLO) 667 MG capsule, Take 3 capsules (2,001 mg total) by mouth 3 (three) times daily with meals. (Patient taking differently: Take 1,334-2,001 mg by mouth 3 (three) times daily with meals. ), Disp: 1 capsule, Rfl: 0 .  gabapentin (NEURONTIN) 300 MG capsule, Take 300 mg by mouth at bedtime. , Disp: , Rfl:  .  midodrine (PROAMATINE) 5 MG tablet, Take 5 mg by mouth See admin instructions. Take 2 tablets (10 mg) by mouth before dialysis on Tuesdays, Thursdays, & Saturday; Take 1 tablet (5 mg) by mouth in the evening on  Tuesdays, Thursdays, & Saturdays. On non-dialysis  days take 1 tablet (5 mg) by mouth every 8 hours., Disp: , Rfl:  .  oxyCODONE (ROXICODONE) 5 MG immediate release tablet, Take 1 tablet (5 mg total) by mouth every 6 (six) hours as needed for severe pain., Disp: 120 tablet, Rfl: 0 .  [START ON 12/26/2018] oxyCODONE (ROXICODONE) 5 MG immediate release tablet, Take 1 tablet (5 mg total) by mouth every 6 (six) hours as needed for severe pain., Disp: 120 tablet, Rfl: 0 .  sildenafil (REVATIO) 20 MG tablet, Take 20-100 mg by mouth as needed (for ED). , Disp: , Rfl:  .  vancomycin IVPB, Inject into the vein. Gets three days a week at dialysis, Disp: , Rfl:   Assessment and Plan: 1. DDD (degenerative disc disease), lumbar   2. Chronic bilateral low back pain without sciatica   3. Facet arthritis of lumbar region   4. Weakness of both lower extremities   5. Chronic, continuous use of opioids   6. Chronic pain syndrome   7. ESRD on dialysis ([employer])   8. Guillain-Barre syndrome ([place])   Based on our review today and upon review of the [place] practitioner database information I think it is reasonable to continue with his current medication therapy.  His oxycodone will be sent in today for August 4 and September 3 with scheduled return evaluation in 2 months.  If any changes are to occur he is instructed to contact the pain control center.  He is also to continue follow-up with his baseline primary care physicians for his general medical problems.  Follow Up Instructions:    I discussed the assessment and treatment plan with the patient. The patient was provided an opportunity to ask questions and all were answered. The patient agreed with the plan and demonstrated an understanding of the instructions.   The patient was advised to call back or seek an in-person evaluation if the symptoms worsen or if the condition fails to improve as anticipated.  I provided 30 minutes of non-face-to-face time during this encounter.   Molli Barrows, MD

## 2018-12-18 ENCOUNTER — Ambulatory Visit: Payer: Medicare Other | Admitting: Anesthesiology

## 2018-12-23 ENCOUNTER — Telehealth: Payer: Self-pay

## 2018-12-23 NOTE — Telephone Encounter (Signed)
Called patient. No answer. LVM

## 2018-12-23 NOTE — Telephone Encounter (Signed)
Pt Called and states Dr Andree Elk keeps sending the wrong Rx. Dr Andree Elk is sending Oxycodone 5mg  and he takes Oxycodone 5mg / 325 acetaminophen . The Oxycodone 325mg  doesn't work as well. Let Pt know.

## 2018-12-24 MED ORDER — OXYCODONE-ACETAMINOPHEN 5-325 MG PO TABS: 1.0000 | ORAL_TABLET | Freq: Four times a day (QID) | ORAL | 0 refills | Status: AC | PRN

## 2018-12-24 NOTE — Telephone Encounter (Signed)
Patient lvmail returning call to office at 3:43 on Monday 12-23-18

## 2018-12-24 NOTE — Addendum Note (Signed)
Addended by: Molli Barrows on: 12/24/2018 09:35 AM   Modules accepted: Orders

## 2019-01-20 ENCOUNTER — Other Ambulatory Visit (INDEPENDENT_AMBULATORY_CARE_PROVIDER_SITE_OTHER): Payer: Self-pay | Admitting: Vascular Surgery

## 2019-01-20 ENCOUNTER — Encounter
Admission: RE | Disposition: A | Discharge status: Home / Self Care | Payer: Self-pay | Source: Ambulatory Visit | Attending: Vascular Surgery

## 2019-01-20 ENCOUNTER — Other Ambulatory Visit: Payer: Self-pay

## 2019-01-20 ENCOUNTER — Ambulatory Visit
Admission: RE | Admit: 2019-01-20 | Discharge: 2019-01-20 | Disposition: A | Discharge status: Home / Self Care | Payer: Medicare Other | Source: Ambulatory Visit | Attending: Vascular Surgery | Admitting: Vascular Surgery

## 2019-01-20 ENCOUNTER — Other Ambulatory Visit (INDEPENDENT_AMBULATORY_CARE_PROVIDER_SITE_OTHER): Payer: Self-pay | Admitting: Nurse Practitioner

## 2019-01-20 ENCOUNTER — Telehealth (INDEPENDENT_AMBULATORY_CARE_PROVIDER_SITE_OTHER): Payer: Self-pay

## 2019-01-20 ENCOUNTER — Other Ambulatory Visit
Admission: RE | Admit: 2019-01-20 | Discharge: 2019-01-20 | Disposition: A | Discharge status: Home / Self Care | Payer: Medicare Other | Source: Ambulatory Visit | Attending: Vascular Surgery | Admitting: Vascular Surgery

## 2019-01-20 ENCOUNTER — Encounter: Payer: Self-pay | Admitting: *Deleted

## 2019-01-20 DIAGNOSIS — T82868A Thrombosis of vascular prosthetic devices, implants and grafts, initial encounter: Secondary | ICD-10-CM | POA: Insufficient documentation

## 2019-01-20 DIAGNOSIS — Y841 Kidney dialysis as the cause of abnormal reaction of the patient, or of later complication, without mention of misadventure at the time of the procedure: Secondary | ICD-10-CM | POA: Diagnosis not present

## 2019-01-20 DIAGNOSIS — Z992 Dependence on renal dialysis: Secondary | ICD-10-CM | POA: Insufficient documentation

## 2019-01-20 DIAGNOSIS — Z885 Allergy status to narcotic agent status: Secondary | ICD-10-CM | POA: Insufficient documentation

## 2019-01-20 DIAGNOSIS — Z20828 Contact with and (suspected) exposure to other viral communicable diseases: Secondary | ICD-10-CM | POA: Diagnosis not present

## 2019-01-20 DIAGNOSIS — I120 Hypertensive chronic kidney disease with stage 5 chronic kidney disease or end stage renal disease: Secondary | ICD-10-CM | POA: Insufficient documentation

## 2019-01-20 DIAGNOSIS — E875 Hyperkalemia: Secondary | ICD-10-CM | POA: Insufficient documentation

## 2019-01-20 DIAGNOSIS — T82898A Other specified complication of vascular prosthetic devices, implants and grafts, initial encounter: Secondary | ICD-10-CM | POA: Diagnosis not present

## 2019-01-20 DIAGNOSIS — N186 End stage renal disease: Secondary | ICD-10-CM

## 2019-01-20 HISTORY — PX: DIALYSIS/PERMA CATHETER INSERTION: CATH118288

## 2019-01-20 LAB — POTASSIUM: Potassium: 6.5 mmol/L (ref 3.5–5.1)

## 2019-01-20 LAB — SARS CORONAVIRUS 2 BY RT PCR (HOSPITAL ORDER, PERFORMED IN ~~LOC~~ HOSPITAL LAB): SARS Coronavirus 2: NEGATIVE

## 2019-01-20 LAB — POTASSIUM (ARMC VASCULAR LAB ONLY): Potassium (ARMC vascular lab): 6.1 — ABNORMAL HIGH (ref 3.5–5.1)

## 2019-01-20 SURGERY — DIALYSIS/PERMA CATHETER INSERTION
Anesthesia: Moderate Sedation

## 2019-01-20 MED ORDER — METHYLPREDNISOLONE SODIUM SUCC 125 MG IJ SOLR: 125.0000 mg | Freq: Once | INTRAMUSCULAR | Status: DC | PRN

## 2019-01-20 MED ORDER — HYDROMORPHONE HCL 1 MG/ML IJ SOLN: 1.0000 mg | Freq: Once | INTRAMUSCULAR | Status: DC | PRN

## 2019-01-20 MED ORDER — FENTANYL CITRATE (PF) 100 MCG/2ML IJ SOLN
INTRAMUSCULAR | Status: AC
  Filled 2019-01-20: qty 2

## 2019-01-20 MED ORDER — SODIUM CHLORIDE 0.9 % IV SOLN
INTRAVENOUS | Status: DC
  Administered 2019-01-20: 13:00:00 via INTRAVENOUS

## 2019-01-20 MED ORDER — IODIXANOL 320 MG/ML IV SOLN
INTRAVENOUS | Status: DC | PRN
  Administered 2019-01-20: 15:00:00 5 mL via INTRAVENOUS

## 2019-01-20 MED ORDER — MIDAZOLAM HCL 2 MG/2ML IJ SOLN
INTRAMUSCULAR | Status: DC | PRN
  Administered 2019-01-20: 1 mg via INTRAVENOUS
  Administered 2019-01-20: 2 mg via INTRAVENOUS

## 2019-01-20 MED ORDER — METHYLPREDNISOLONE SODIUM SUCC 125 MG IJ SOLR
125.0000 mg | Freq: Once | INTRAMUSCULAR | Status: AC | PRN
  Administered 2019-01-20: 13:00:00 125 mg via INTRAVENOUS

## 2019-01-20 MED ORDER — FAMOTIDINE 20 MG PO TABS: 40.0000 mg | ORAL_TABLET | Freq: Once | ORAL | Status: DC | PRN

## 2019-01-20 MED ORDER — DIPHENHYDRAMINE HCL 50 MG/ML IJ SOLN
50.0000 mg | Freq: Once | INTRAMUSCULAR | Status: AC | PRN
  Administered 2019-01-20: 13:00:00 25 mg via INTRAVENOUS

## 2019-01-20 MED ORDER — PATIROMER SORBITEX CALCIUM 8.4 G PO PACK
16.8000 g | PACK | Freq: Once | ORAL | Status: DC
  Filled 2019-01-20: qty 2

## 2019-01-20 MED ORDER — MIDAZOLAM HCL 5 MG/5ML IJ SOLN
INTRAMUSCULAR | Status: AC
  Filled 2019-01-20: qty 5

## 2019-01-20 MED ORDER — DIPHENHYDRAMINE HCL 50 MG/ML IJ SOLN
INTRAMUSCULAR | Status: DC | PRN
  Administered 2019-01-20: 25 mg via INTRAVENOUS

## 2019-01-20 MED ORDER — METHYLPREDNISOLONE SODIUM SUCC 125 MG IJ SOLR
INTRAMUSCULAR | Status: AC
  Administered 2019-01-20: 125 mg via INTRAVENOUS
  Filled 2019-01-20: qty 2

## 2019-01-20 MED ORDER — FAMOTIDINE 20 MG PO TABS
ORAL_TABLET | ORAL | Status: AC
  Administered 2019-01-20: 14:00:00 40 mg via ORAL
  Filled 2019-01-20: qty 2

## 2019-01-20 MED ORDER — DIPHENHYDRAMINE HCL 50 MG/ML IJ SOLN
INTRAMUSCULAR | Status: AC
  Administered 2019-01-20: 13:00:00 25 mg via INTRAVENOUS
  Filled 2019-01-20: qty 1

## 2019-01-20 MED ORDER — HEPARIN SODIUM (PORCINE) 10000 UNIT/ML IJ SOLN
INTRAMUSCULAR | Status: AC
  Filled 2019-01-20: qty 1

## 2019-01-20 MED ORDER — ONDANSETRON HCL 4 MG/2ML IJ SOLN: 4.0000 mg | Freq: Four times a day (QID) | INTRAMUSCULAR | Status: DC | PRN

## 2019-01-20 MED ORDER — SODIUM CHLORIDE 0.9 % IV SOLN: INTRAVENOUS | Status: DC

## 2019-01-20 MED ORDER — MIDAZOLAM HCL 2 MG/ML PO SYRP: 8.0000 mg | ORAL_SOLUTION | Freq: Once | ORAL | Status: DC | PRN

## 2019-01-20 MED ORDER — FENTANYL CITRATE (PF) 100 MCG/2ML IJ SOLN
INTRAMUSCULAR | Status: DC | PRN
  Administered 2019-01-20 (×2): 50 ug via INTRAVENOUS

## 2019-01-20 MED ORDER — FAMOTIDINE 20 MG PO TABS
40.0000 mg | ORAL_TABLET | Freq: Once | ORAL | Status: AC | PRN
  Administered 2019-01-20: 14:00:00 40 mg via ORAL

## 2019-01-20 MED ORDER — DIPHENHYDRAMINE HCL 50 MG/ML IJ SOLN: 50.0000 mg | Freq: Once | INTRAMUSCULAR | Status: DC | PRN

## 2019-01-20 MED ORDER — CEFAZOLIN SODIUM-DEXTROSE 1-4 GM/50ML-% IV SOLN
1.0000 g | Freq: Once | INTRAVENOUS | Status: AC
  Administered 2019-01-20: 1 g via INTRAVENOUS

## 2019-01-20 MED ORDER — CEFAZOLIN SODIUM-DEXTROSE 1-4 GM/50ML-% IV SOLN: 1.0000 g | Freq: Once | INTRAVENOUS | Status: DC

## 2019-01-20 SURGICAL SUPPLY — 10 items
BIOPATCH RED 1 DISK 7.0 (GAUZE/BANDAGES/DRESSINGS) ×2 IMPLANT
BIOPATCH RED 1IN DISK 7.0MM (GAUZE/BANDAGES/DRESSINGS) ×1
CANNULA 5F STIFF (CANNULA) ×3 IMPLANT
CATH CANNON HEMO 15FR 19 (HEMODIALYSIS SUPPLIES) ×3 IMPLANT
COVER PROBE U/S 5X48 (MISCELLANEOUS) ×3 IMPLANT
DERMABOND ADVANCED (GAUZE/BANDAGES/DRESSINGS) ×2
DERMABOND ADVANCED .7 DNX12 (GAUZE/BANDAGES/DRESSINGS) ×1 IMPLANT
PACK ANGIOGRAPHY (CUSTOM PROCEDURE TRAY) ×3 IMPLANT
SUT MNCRL AB 4-0 PS2 18 (SUTURE) ×3 IMPLANT
SUT PROLENE 0 CT 1 30 (SUTURE) ×3 IMPLANT

## 2019-01-20 NOTE — Discharge Instructions (Signed)
Dr Ozella Almond office will call you with follow up apt for return for fistulagram with declot later this week.  Avoid foods high in potasium   Tunneled Catheter Insertion, Care After This sheet gives you information about how to care for yourself after your procedure. Your health care provider may also give you more specific instructions. If you have problems or questions, contact your health care provider. What can I expect after the procedure? After the procedure, it is common to have:  Some mild redness, bruising, swelling, and pain around your catheter site.  A small amount of blood or clear fluid coming from your incisions. Follow these instructions at home: Incision care   Follow instructions from your health care provider about how to take care of your incisions. Make sure you: ? Wash your hands with soap and water before and after you change your bandages (dressings). If soap and water are not available, use hand sanitizer. ? Change your dressings as told by your health care provider. Wash the area around your incisions with a germ-killing (antiseptic) solution when you change your dressings. ? Leave stitches (sutures), skin glue, or adhesive strips in place. These skin closures may need to stay in place for 2 weeks or longer. If adhesive strip edges start to loosen and curl up, you may trim the loose edges. Do not remove adhesive strips completely unless your health care provider tells you to do that.  Keep your dressings clean and dry.  Check your incision areas every day for signs of infection. Check for: ? More redness, swelling, or pain. ? More fluid or blood. ? Warmth. ? Pus or a bad smell. Catheter care   Wash your hands with soap and water before and after caring for your catheter. If soap and water are not available, use hand sanitizer.  Keep your catheter site clean and dry.  Apply an antibiotic ointment to your catheter site as told by your health care  provider.  Flush your catheter as told by your health care provider. This helps prevent it from becoming clogged.  Do not open the caps on the ends of the catheter.  Do not pull on your catheter. Medicines  Take over-the-counter and prescription medicines only as told by your health care provider.  If you were prescribed an antibiotic medicine, take it as told by your health care provider. Do not stop taking the antibiotic even if you start to feel better. Activity  Return to your normal activities as told by your health care provider. Ask your health care provider what activities are safe for you.  Follow any other activity restrictions as instructed by your health care provider.  Do not lift anything that is heavier than 10 lb (4.5 kg), or the limit that you are told, until your health care provider says that it is safe. Driving  Do not drive until your health care provider approves.  Ask your health care provider if the medicine prescribed to you requires you to avoid driving or using heavy machinery. General instructions  Follow your health care provider's specific instructions for the type of catheter that you have.  Do not take baths, swim, or use a hot tub until your health care provider approves. Ask your health care provider if you may take showers.  Keep all follow-up visits as told by your health care provider. This is important. Contact a health care provider if:  You feel unusually weak or nauseous.  You have more redness, swelling, or pain  at your incisions or around the area where your catheter is inserted.  Your catheter is not working properly.  You are unable to flush your catheter. Get help right away if:  Your catheter develops a hole or it breaks.  You have pain or swelling when fluids or medicines are being given through the catheter.  Fluid is leaking from the catheter, under the dressing, or around the dressing.  Your catheter comes loose or  gets pulled completely out. If this happens, press on your catheter site firmly with a clean cloth until you can get medical help.  You have swelling in your shoulder, neck, chest, or face.  You have chest pain or difficulty breathing.  You feel dizzy or light-headed.  You have pus or a bad smell coming from your catheter site.  You have a fever or chills.  Your catheter site feels warm to the touch.  You develop bleeding from your catheter or your insertion site, and your bleeding does not stop. Summary  After the procedure, it is common to have mild redness, swelling, and pain around your catheter site.  Return to your normal activities as told by your health care provider. Ask your health care provider what activities are safe for you.  Follow your health care provider's specific instructions for the type of catheter that you have.  Keep your catheter site and your dressings clean and dry.  Contact a health care provider if your catheter is not working properly. Get help right away if you have chest pain, fever, or difficulty breathing. This information is not intended to replace advice given to you by your health care provider. Make sure you discuss any questions you have with your health care provider. Document Released: 03/27/2012 Document Revised: 04/02/2018 Document Reviewed: 04/02/2018 Elsevier Patient Education  2020 [employer].

## 2019-01-20 NOTE — OR Nursing (Signed)
Dr dew notified that lab draw potassium level is 6.5

## 2019-01-20 NOTE — H&P (Signed)
[facility] Admission History & Physical  MRN : [id number]  Patrick Downs is a 43 y.o. ([birth year]) male who presents with chief complaint of non-functioning dialysis access.  History of Present Illness: I am asked to evaluate the patient by the dialysis center. The patient was sent here because they were unable to cannulate his right upper extremity dialysis access since Saturday. Furthermore the Center states there is no thrill or bruit. The patient states this is the first dialysis run to be missed. This problem is acute in onset and has been present for approximately 2 days. The patient is unaware of any other change.  Patient denies pain or tenderness overlying the access.  There is no pain with dialysis.  The patient denies hand pain or finger pain consistent with steal syndrome.   Patient was found to have a potassium of 6.5.  Unfortunately, moving forward with a peripheral declot with a potassium of 6.5 places the patient at a high risk for a cardiac arrhythmia.  The patient was offered a temporary dialysis catheter and admission to the hospital to undergo emergent dialysis with return to the endovascular suite tomorrow for declot however he refuses.    The patient did agree to proceed with a PermCath insertion, dose of Veltassa and to dialyze as per his regular schedule tomorrow.  He will return on Wednesday to undergo a declot of his right upper extremity dialysis access.  Current Facility-Administered Medications  Medication Dose Route Frequency Provider Last Rate Last Dose  . 0.9 %  sodium chloride infusion   Intravenous Continuous Kris Hartmann, NP 10 mL/hr at 01/20/19 1237    . ceFAZolin (ANCEF) IVPB 1 g/50 mL premix  1 g Intravenous Once Eulogio Ditch E, NP      . fentaNYL (SUBLIMAZE) 100 MCG/2ML injection           . HYDROmorphone (DILAUDID) injection 1 mg  1 mg Intravenous Once PRN Eulogio Ditch E, NP      . midazolam (VERSED) 2 MG/ML syrup 8 mg  8  mg Oral Once PRN Kris Hartmann, NP      . midazolam (VERSED) 5 MG/5ML injection           . ondansetron (ZOFRAN) injection 4 mg  4 mg Intravenous Q6H PRN Kris Hartmann, NP      . patiromer (VELTASSA) packet 16.8 g  16.8 g Oral Once Aleksandra Raben, Abbe [place]       Past Medical History:  Diagnosis Date  . Depression   . Dialysis patient ([place])   . ESRD (end stage renal disease) on dialysis ([place])   . Failure to thrive in adult   . Gout   . Guillain Barr syndrome ([place])   . HTN (hypertension)   . Kidney failure   . Pneumonia   . Renal insufficiency   . Respiratory failure [facility])    Past Surgical History:  Procedure Laterality Date  . A/V FISTULAGRAM Right 01/07/2018   Procedure: A/V FISTULAGRAM;  Surgeon: Algernon Huxley, MD;  Location: [place];  Service: Cardiovascular;  Laterality: Right;  . AMPUTATION TOE Right 06/08/2017   Procedure: AMPUTATION TOE RIGHT FIFTH TOE;  Surgeon: Samara Deist, DPM;  Location: [facility];  Service: Podiatry;  Laterality: Right;  . AMPUTATION TOE Left 05/17/2018   Procedure: RAY LEFT 5TH;  Surgeon: Samara Deist, DPM;  Location: [facility];  Service: Podiatry;  Laterality: Left;  . AV FISTULA PLACEMENT  x5      2 graphs  . DIALYSIS/PERMA CATHETER REMOVAL N/A 06/17/2018   Procedure: DIALYSIS/PERMA CATHETER REMOVAL;  Surgeon: Algernon Huxley, MD;  Location: [place] [facility];  Service: Cardiovascular;  Laterality: N/A;  . PARATHYROIDECTOMY    . RENAL BIOPSY    . REVISON OF ARTERIOVENOUS FISTULA Right 02/07/2018   Procedure: REVISON OF ARTERIOVENOUS FISTULA;  Surgeon: Algernon Huxley, MD;  Location: [facility];  Service: Vascular;  Laterality: Right;  . TEE WITHOUT CARDIOVERSION N/A 01/22/2018   Procedure: TRANSESOPHAGEAL ECHOCARDIOGRAM (TEE);  Surgeon: Dionisio David, MD;  Location: [facility];  Service: Cardiovascular;  Laterality: N/A;  . tonsiilectomy    . tracheotomy     Social History Social History   Tobacco Use  . Smoking status: Never  Smoker  . Smokeless tobacco: Never Used  Substance Use Topics  . Alcohol use: No  . Drug use: Yes    Types: Marijuana    Comment: last time was June   Family History Family History  Problem Relation Age of Onset  . Diabetes Mellitus II Father   . Kidney disease Father   . Kidney failure Paternal Grandfather   . Prostate cancer Neg Hx   . Kidney cancer Neg Hx   . Bladder Cancer Neg Hx   No family history of bleeding or clotting disorders, autoimmune disease or porphyria  Allergies  Allergen Reactions  . Ondansetron Other (See Comments)    Stomach pain   . Minoxidil Other (See Comments)    "put fluid around my heart", PERICARDIAL EFFUSION  . Morphine And Related Other (See Comments)    Aggressive   . Omnipaque [Iohexol] Itching and Other (See Comments)    Rigors on one occasion, widespread itching on a separate occasion (resolved with Benadryl), tremors   REVIEW OF SYSTEMS (Negative unless checked)  Constitutional: [] Weight loss  [] Fever  [] Chills Cardiac: [] Chest pain   [] Chest pressure   [] Palpitations   [] Shortness of breath when laying flat   [] Shortness of breath at rest   [x] Shortness of breath with exertion. Vascular:  [] Pain in legs with walking   [] Pain in legs at rest   [] Pain in legs when laying flat   [] Claudication   [] Pain in feet when walking  [] Pain in feet at rest  [] Pain in feet when laying flat   [] History of DVT   [] Phlebitis   [] Swelling in legs   [] Varicose veins   [] Non-healing ulcers Pulmonary:   [] Uses home oxygen   [] Productive cough   [] Hemoptysis   [] Wheeze  [] COPD   [] Asthma Neurologic:  [] Dizziness  [] Blackouts   [] Seizures   [] History of stroke   [] History of TIA  [] Aphasia   [] Temporary blindness   [] Dysphagia   [] Weakness or numbness in arms   [] Weakness or numbness in legs Musculoskeletal:  [] Arthritis   [] Joint swelling   [] Joint pain   [] Low back pain Hematologic:  [] Easy bruising  [] Easy bleeding   [] Hypercoagulable state   [] Anemic   [] Hepatitis Gastrointestinal:  [] Blood in stool   [] Vomiting blood  [] Gastroesophageal reflux/heartburn   [] Difficulty swallowing. Genitourinary:  [x] Chronic kidney disease   [] Difficult urination  [] Frequent urination  [] Burning with urination   [] Blood in urine Skin:  [] Rashes   [] Ulcers   [] Wounds Psychological:  [] History of anxiety   []  History of major depression.  Physical Examination  Vitals:   01/20/19 1234  BP: 121/90  Pulse: 70  Resp: 19  Temp: 97.6 F (36.4 C)  TempSrc: Oral  SpO2: 96%  Weight: 115.7 kg  Height: 6\' 2"  (1.88 m)   Body mass index is 32.74 kg/m. Gen: WD/WN, NAD Head: [place]/AT, No temporalis wasting. Prominent temp pulse not noted. Ear/Nose/Throat: Hearing grossly intact, nares w/o erythema or drainage, oropharynx w/o Erythema/Exudate,  Eyes: Conjunctiva clear, sclera non-icteric Neck: Trachea midline.  No JVD.  Pulmonary:  Good air movement, respirations not labored, no use of accessory muscles.  Cardiac: RRR, normal S1, S2. Vascular:  Vessel Right Left  Radial Palpable Palpable  Ulnar Not Palpable Not Palpable  Brachial Palpable Palpable  Carotid Palpable, without bruit Palpable, without bruit   Right Upper Extremity: No bruit or thrill. Skin intact. 2 radial pulse  Gastrointestinal: soft, non-tender/non-distended. No guarding/reflex.  Musculoskeletal: M/S 5/5 throughout.  Extremities without ischemic changes.  No deformity or atrophy.  Neurologic: Sensation grossly intact in extremities.  Symmetrical.  Speech is fluent. Motor exam as listed above. Psychiatric: Judgment intact, Mood & affect appropriate for pt's clinical situation. Dermatologic: No rashes or ulcers noted.  No cellulitis or open wounds. Lymph : No Cervical, Axillary, or Inguinal lymphadenopathy.  CBC Lab Results  Component Value Date   WBC 6.3 02/06/2018   HGB 10.2 (L) 05/17/2018   HCT 30.0 (L) 05/17/2018   MCV 93.9 02/06/2018   PLT 169 02/06/2018   BMET    Component  Value Date/Time   NA 141 05/17/2018 0911   NA 136 08/22/2014 0653   K 6.5 (HH) 01/20/2019 1247   K 5.1 08/22/2014 0653   CL 101 02/06/2018 1141   CL 95 (L) 08/22/2014 0653   CO2 23 02/06/2018 1141   CO2 27 08/22/2014 0653   GLUCOSE 96 05/17/2018 0911   GLUCOSE 176 (H) 08/22/2014 0653   BUN 60 (H) 02/06/2018 1141   BUN 71 (H) 08/22/2014 0653   CREATININE 13.96 (H) 02/06/2018 1141   CREATININE 8.87 (H) 08/22/2014 0653   CALCIUM 8.3 (L) 02/06/2018 1141   CALCIUM 7.1 (L) 08/22/2014 0653   GFRNONAA 4 (L) 02/06/2018 1141   GFRNONAA 7 (L) 08/22/2014 0653   GFRAA 4 (L) 02/06/2018 1141   GFRAA 8 (L) 08/22/2014 0653   CrCl cannot be calculated (Patient's most recent lab result is older than the maximum 21 days allowed.).  COAG Lab Results  Component Value Date   INR 1.13 02/06/2018   INR 1.46 08/31/2014   INR 1.23 08/28/2014   Radiology No results found.  Assessment/Plan 1.  Complication dialysis device with thrombosis AV access:  Patient's right dialysis access is thrombosed. Patient was found to have a potassium of 6.5.  Unfortunately, moving forward with a peripheral declot with a potassium of 6.5 places the patient at a high risk for a cardiac arrhythmia.  The patient was offered a temporary dialysis catheter and admission to the hospital to undergo emergent dialysis with return to the endovascular suite tomorrow for declot however he refused. After speaking with Dr. Lucky Cowboy - the patient did agree to proceed with a PermCath insertion, dose of Veltassa and to dialyze as per his regular schedule tomorrow. He states he understands the risks of leaving the hospital with a high potassium. He will return on Wednesday to undergo a declot of his right upper extremity dialysis access.  2.  End-stage renal disease requiring hemodialysis:   Patient states that he will go to dialysis tomorrow as per his normal routine. 3. Hyperkalemia: The patient had a long discussion with Dr. Lucky Cowboy (I was present  for) discussing the risks of  leaving the hospital with a high potassium.  He understands the risks can include fatal cardiac arrhythmias.  The patient will get a dose of Veltassa before discharge.  The patient was also offered Kayexalate however refused.  Seen and examined with Dr. Mayme Genta, PA-C  01/20/2019 2:14 PM

## 2019-01-20 NOTE — Telephone Encounter (Signed)
I fax was received this morning from [facility] regarding the patient. Per [facility] the patient has a clotted graft and has not had treatment since Thursday. A call to Vascular was made and per Dr. Lucky Cowboy the patient can be scheduled for this afternoon. The patient was scheduled for today 01/20/2019 with a 12;30 pm arrival time to the [facility]. Patient will do his Covid testing today 01/20/2019 before 10:00 am at the [facility]. A call was made to [facility] at [facility] and all pre-procedure instructions were discussed. Per Misty she will contact the patient and give him times and instructions for his procedure.

## 2019-01-20 NOTE — Op Note (Signed)
OPERATIVE NOTE    PRE-OPERATIVE DIAGNOSIS: 1. ESRD 2. Clotted arm access with hyperkalemia precluding thrombectomy  POST-OPERATIVE DIAGNOSIS: same as above  PROCEDURE: 1. Ultrasound guidance for vascular access to the right internal jugular vein 2. Fluoroscopic guidance for placement of catheter 3. Right jugular venogram and superior venacavogram through a micropuncture sheath and jugular vein 4. Placement of a 19 cm tip to cuff tunneled hemodialysis catheter via the right internal jugular vein  SURGEON: Leotis Pain, MD  ANESTHESIA:  Local with Moderate conscious sedation for approximately 20 minutes using 3 mg of Versed and 100 mcg of Fentanyl  ESTIMATED BLOOD LOSS: 5 cc  FLUORO TIME: less than one minute  CONTRAST: 5 cc  FINDING(S): 1.  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  Below this, the right innominate vein and superior vena cava were widely patent.  SPECIMEN(S):  None  INDICATIONS:   Patrick Downs is a 43 y.o.male who presents with renal failure and a clotted access for hyperkalemia precluding thrombectomy today.  We are going to try to do this later in the week.  The patient needs long term dialysis access for their ESRD, and a Permcath is necessary get dialysis to reduce his potassium as well as have access until his access in his arm is treated.  Risks and benefits are discussed and informed consent is obtained.    DESCRIPTION: After obtaining full informed written consent, the patient was brought back to the vascular suited. The patient's right neck and chest were sterilely prepped and draped in a sterile surgical field was created. Moderate conscious sedation was administered during a face to face encounter with the patient throughout the procedure with my supervision of the RN administering medicines and monitoring the patient's vital signs, pulse oximetry, telemetry and mental status throughout from  the start of the procedure until the patient was taken to the recovery room.  The right internal jugular vein was visualized with ultrasound and found to be patent in the mid neck although it clearly had a narrowing just above the clavicle on ultrasound. It was then accessed under direct ultrasound guidance and a permanent image was recorded the micropuncture needle. A wire was placed.  A micropuncture sheath was then placed into the jugular vein and since there was difficulty getting the wire to pass initially, imaging was performed.  The tip of the catheter was below the stricture in the mid neck there is clearly at least a moderate stricture there but the innominate vein and superior vena cava below this were widely patent.  I then went ahead and proceeded with catheter placement.  A 0.035 J-wire was placed through the micropuncture catheter and the micropuncture catheter was removed.  After skin nick and dilatation, the peel-away sheath was placed over the wire. I then turned my attention to an area under the clavicle. Approximately 1-2 fingerbreadths below the clavicle a small counterincision was created and tunneled from the subclavicular incision to the access site. Using fluoroscopic guidance, a 19 centimeter tip to cuff tunneled hemodialysis catheter was selected, and tunneled from the subclavicular incision to the access site. It was then placed through the peel-away sheath and the peel-away sheath was removed. Using fluoroscopic guidance the catheter tips were parked in the right atrium. The appropriate distal connectors were placed. It withdrew blood well and flushed easily with heparinized saline and a concentrated heparin solution was then placed. It was secured to  the chest wall with 2 Prolene sutures. The access incision was closed single 4-0 Monocryl. A 4-0 Monocryl pursestring suture was placed around the exit site. Sterile dressings were placed. The patient tolerated the procedure well and was  taken to the recovery room in stable condition.  COMPLICATIONS: None  CONDITION: Stable  Leotis Pain, MD 01/20/2019 3:05 PM   This note was created with Dragon Medical transcription system. Any errors in dictation are purely unintentional.

## 2019-01-20 NOTE — OR Nursing (Addendum)
Dr dew notified that potassium 6.0 and 6.1 with recheck, lab draw pending (pre med for contrast allergy was given benadryl 25 and solumedrol 125 iv before potassium results received). pepcis was not given yet.

## 2019-01-21 ENCOUNTER — Encounter (INDEPENDENT_AMBULATORY_CARE_PROVIDER_SITE_OTHER): Payer: Self-pay

## 2019-01-21 ENCOUNTER — Encounter: Payer: Self-pay | Admitting: Vascular Surgery

## 2019-01-21 NOTE — Telephone Encounter (Signed)
I attempted to contact the patient and a message for a return call was left.   Patient was rescheduled at the hospital from 01/20/2019 to 01/22/2019 with a 10:15 am arrival time to the [facility]. Patient had his Covid testing on 01/20/2019. Pre-procedure instructions were faxed to attn: Misty at [facility] after I spoke with her and confirmed the patient was there.

## 2019-01-22 ENCOUNTER — Encounter
Admission: RE | Disposition: A | Discharge status: Home / Self Care | Payer: Self-pay | Source: Ambulatory Visit | Attending: Vascular Surgery

## 2019-01-22 ENCOUNTER — Encounter: Payer: Self-pay | Admitting: Certified Registered Nurse Anesthetist

## 2019-01-22 ENCOUNTER — Ambulatory Visit
Admission: RE | Admit: 2019-01-22 | Discharge: 2019-01-22 | Disposition: A | Discharge status: Home / Self Care | Payer: Medicare Other | Source: Ambulatory Visit | Attending: Vascular Surgery | Admitting: Vascular Surgery

## 2019-01-22 ENCOUNTER — Other Ambulatory Visit: Payer: Self-pay

## 2019-01-22 ENCOUNTER — Other Ambulatory Visit (INDEPENDENT_AMBULATORY_CARE_PROVIDER_SITE_OTHER): Payer: Self-pay | Admitting: Nurse Practitioner

## 2019-01-22 ENCOUNTER — Encounter: Payer: Self-pay | Admitting: *Deleted

## 2019-01-22 DIAGNOSIS — Y832 Surgical operation with anastomosis, bypass or graft as the cause of abnormal reaction of the patient, or of later complication, without mention of misadventure at the time of the procedure: Secondary | ICD-10-CM | POA: Diagnosis not present

## 2019-01-22 DIAGNOSIS — Z6832 Body mass index (BMI) 32.0-32.9, adult: Secondary | ICD-10-CM | POA: Insufficient documentation

## 2019-01-22 DIAGNOSIS — N186 End stage renal disease: Secondary | ICD-10-CM | POA: Insufficient documentation

## 2019-01-22 DIAGNOSIS — E875 Hyperkalemia: Secondary | ICD-10-CM | POA: Diagnosis not present

## 2019-01-22 DIAGNOSIS — Z8249 Family history of ischemic heart disease and other diseases of the circulatory system: Secondary | ICD-10-CM | POA: Insufficient documentation

## 2019-01-22 DIAGNOSIS — T82868A Thrombosis of vascular prosthetic devices, implants and grafts, initial encounter: Secondary | ICD-10-CM | POA: Diagnosis present

## 2019-01-22 DIAGNOSIS — I120 Hypertensive chronic kidney disease with stage 5 chronic kidney disease or end stage renal disease: Secondary | ICD-10-CM | POA: Insufficient documentation

## 2019-01-22 DIAGNOSIS — R627 Adult failure to thrive: Secondary | ICD-10-CM | POA: Insufficient documentation

## 2019-01-22 DIAGNOSIS — Z841 Family history of disorders of kidney and ureter: Secondary | ICD-10-CM | POA: Insufficient documentation

## 2019-01-22 DIAGNOSIS — G610 Guillain-Barre syndrome: Secondary | ICD-10-CM | POA: Diagnosis not present

## 2019-01-22 DIAGNOSIS — N179 Acute kidney failure, unspecified: Secondary | ICD-10-CM | POA: Insufficient documentation

## 2019-01-22 DIAGNOSIS — Z992 Dependence on renal dialysis: Secondary | ICD-10-CM | POA: Diagnosis not present

## 2019-01-22 HISTORY — PX: PERIPHERAL VASCULAR THROMBECTOMY: CATH118306

## 2019-01-22 LAB — POTASSIUM (ARMC VASCULAR LAB ONLY): Potassium (ARMC vascular lab): 5.2 — ABNORMAL HIGH (ref 3.5–5.1)

## 2019-01-22 SURGERY — PERIPHERAL VASCULAR THROMBECTOMY
Anesthesia: Moderate Sedation | Laterality: Right

## 2019-01-22 MED ORDER — FAMOTIDINE 20 MG PO TABS
40.0000 mg | ORAL_TABLET | Freq: Once | ORAL | Status: AC | PRN
  Administered 2019-01-22: 40 mg via ORAL

## 2019-01-22 MED ORDER — FENTANYL CITRATE (PF) 100 MCG/2ML IJ SOLN
INTRAMUSCULAR | Status: AC
  Filled 2019-01-22: qty 2

## 2019-01-22 MED ORDER — FAMOTIDINE 20 MG PO TABS
ORAL_TABLET | ORAL | Status: AC
  Filled 2019-01-22: qty 2

## 2019-01-22 MED ORDER — CEFAZOLIN SODIUM-DEXTROSE 1-4 GM/50ML-% IV SOLN
INTRAVENOUS | Status: AC
  Filled 2019-01-22: qty 50

## 2019-01-22 MED ORDER — DIPHENHYDRAMINE HCL 50 MG/ML IJ SOLN
INTRAMUSCULAR | Status: AC
  Administered 2019-01-22: 50 mg via INTRAVENOUS
  Filled 2019-01-22: qty 1

## 2019-01-22 MED ORDER — SODIUM CHLORIDE 0.9 % IV SOLN: INTRAVENOUS | Status: DC

## 2019-01-22 MED ORDER — MIDAZOLAM HCL 2 MG/ML PO SYRP: 8.0000 mg | ORAL_SOLUTION | Freq: Once | ORAL | Status: DC | PRN

## 2019-01-22 MED ORDER — MIDAZOLAM HCL 2 MG/2ML IJ SOLN
INTRAMUSCULAR | Status: DC | PRN
  Administered 2019-01-22: 2 mg via INTRAVENOUS
  Administered 2019-01-22: 1 mg via INTRAVENOUS
  Administered 2019-01-22: 2 mg via INTRAVENOUS

## 2019-01-22 MED ORDER — FENTANYL CITRATE (PF) 100 MCG/2ML IJ SOLN
INTRAMUSCULAR | Status: DC | PRN
  Administered 2019-01-22: 50 ug via INTRAVENOUS
  Administered 2019-01-22: 25 ug via INTRAVENOUS
  Administered 2019-01-22: 50 ug via INTRAVENOUS

## 2019-01-22 MED ORDER — SODIUM CHLORIDE FLUSH 0.9 % IV SOLN
INTRAVENOUS | Status: AC
  Filled 2019-01-22: qty 40

## 2019-01-22 MED ORDER — HEPARIN SODIUM (PORCINE) 1000 UNIT/ML IJ SOLN
INTRAMUSCULAR | Status: AC
  Filled 2019-01-22: qty 1

## 2019-01-22 MED ORDER — HEPARIN SODIUM (PORCINE) 1000 UNIT/ML IJ SOLN
INTRAMUSCULAR | Status: DC | PRN
  Administered 2019-01-22: 4000 [IU] via INTRAVENOUS

## 2019-01-22 MED ORDER — METHYLPREDNISOLONE SODIUM SUCC 125 MG IJ SOLR
125.0000 mg | Freq: Once | INTRAMUSCULAR | Status: AC | PRN
  Administered 2019-01-22: 14:00:00 125 mg via INTRAVENOUS

## 2019-01-22 MED ORDER — IODIXANOL 320 MG/ML IV SOLN
INTRAVENOUS | Status: DC | PRN
  Administered 2019-01-22: 65 mL via INTRAVENOUS

## 2019-01-22 MED ORDER — HYDROMORPHONE HCL 1 MG/ML IJ SOLN: 1.0000 mg | Freq: Once | INTRAMUSCULAR | Status: DC | PRN

## 2019-01-22 MED ORDER — DIPHENHYDRAMINE HCL 50 MG/ML IJ SOLN
50.0000 mg | Freq: Once | INTRAMUSCULAR | Status: AC | PRN
  Administered 2019-01-22: 14:00:00 50 mg via INTRAVENOUS

## 2019-01-22 MED ORDER — MIDAZOLAM HCL 5 MG/5ML IJ SOLN
INTRAMUSCULAR | Status: AC
  Filled 2019-01-22: qty 5

## 2019-01-22 MED ORDER — ALTEPLASE 2 MG IJ SOLR
INTRAMUSCULAR | Status: DC | PRN
  Administered 2019-01-22: 4 mg

## 2019-01-22 MED ORDER — CEFAZOLIN SODIUM-DEXTROSE 1-4 GM/50ML-% IV SOLN
1.0000 g | Freq: Once | INTRAVENOUS | Status: AC
  Administered 2019-01-22: 1 g via INTRAVENOUS

## 2019-01-22 MED ORDER — METHYLPREDNISOLONE SODIUM SUCC 125 MG IJ SOLR
INTRAMUSCULAR | Status: AC
  Administered 2019-01-22: 125 mg via INTRAVENOUS
  Filled 2019-01-22: qty 2

## 2019-01-22 SURGICAL SUPPLY — 19 items
BALLN DORADO 6X80X80 (BALLOONS) ×3
BALLN DORADO 8X60X80 (BALLOONS) ×3
BALLOON DORADO 6X80X80 (BALLOONS) ×1 IMPLANT
BALLOON DORADO 8X60X80 (BALLOONS) ×1 IMPLANT
CANNULA 5F STIFF (CANNULA) ×3 IMPLANT
CATH BEACON 5 .035 40 KMP TP (CATHETERS) ×1 IMPLANT
CATH BEACON 5 .038 40 KMP TP (CATHETERS) ×2
CATH EMBOLECTOMY 5FR (BALLOONS) ×3 IMPLANT
DEVICE PRESTO INFLATION (MISCELLANEOUS) ×3 IMPLANT
DRAPE BRACHIAL (DRAPES) ×3 IMPLANT
PACK ANGIOGRAPHY (CUSTOM PROCEDURE TRAY) ×3 IMPLANT
SET AVX THROMB ULT (MISCELLANEOUS) ×6 IMPLANT
SHEATH BRITE TIP 6FRX5.5 (SHEATH) ×6 IMPLANT
SHEATH BRITE TIP 7FRX5.5 (SHEATH) ×3 IMPLANT
STENT VIABAHN 8X50X120 (Permanent Stent) ×3 IMPLANT
STENT VIABAHN5X120X8X (Permanent Stent) ×1 IMPLANT
SUT MNCRL AB 4-0 PS2 18 (SUTURE) ×3 IMPLANT
WIRE G 018X200 V18 (WIRE) ×3 IMPLANT
WIRE MAGIC TOR.035 180C (WIRE) ×9 IMPLANT

## 2019-01-22 NOTE — Discharge Instructions (Signed)
Moderate Conscious Sedation, Adult, Care After These instructions provide you with information about caring for yourself after your procedure. Your health care provider may also give you more specific instructions. Your treatment has been planned according to current medical practices, but problems sometimes occur. Call your health care provider if you have any problems or questions after your procedure. What can I expect after the procedure? After your procedure, it is common:  To feel sleepy for several hours.  To feel clumsy and have poor balance for several hours.  To have poor judgment for several hours.  To vomit if you eat too soon. Follow these instructions at home: For at least 24 hours after the procedure:   Do not: ? Participate in activities where you could fall or become injured. ? Drive. ? Use heavy machinery. ? Drink alcohol. ? Take sleeping pills or medicines that cause drowsiness. ? Make important decisions or sign legal documents. ? Take care of children on your own.  Rest. Eating and drinking  Follow the diet recommended by your health care provider.  If you vomit: ? Drink water, juice, or soup when you can drink without vomiting. ? Make sure you have little or no nausea before eating solid foods. General instructions  Have a responsible adult stay with you until you are awake and alert.  Take over-the-counter and prescription medicines only as told by your health care provider.  If you smoke, do not smoke without supervision.  Keep all follow-up visits as told by your health care provider. This is important. Contact a health care provider if:  You keep feeling nauseous or you keep vomiting.  You feel light-headed.  You develop a rash.  You have a fever. Get help right away if:  You have trouble breathing. This information is not intended to replace advice given to you by your health care provider. Make sure you discuss any questions you have  with your health care provider. Document Released: 01/29/2013 Document Revised: 03/23/2017 Document Reviewed: 07/31/2015 Elsevier Patient Education  2020 Else[employer] Inc[employer] Dialysis Fistulogram, Care After This sheet gives you information about how to care for yourself after your procedure. Your health care provider may also give you more specific instructions. If you have problems or questions, contact your health care provider. What can I expect after the procedure? After the procedure, it is common to have:  A small amount of discomfort in the area where the small, thin tube (catheter) was placed for the procedure.  A small amount of bruising around the fistula.  Sleepiness and tiredness (fatigue). Follow these instructions at home: Activity   Rest at home and do not lift anything that is heavier than 5 lb (2.3 kg) on the day after your procedure.  Return to your normal activities as told by your health care provider. Ask your health care provider what activities are safe for you.  Do not drive or use heavy machinery while taking prescription pain medicine.  Do not drive for 24 hours if you were given a medicine to help you relax (sedative) during your procedure. Medicines   Take over-the-counter and prescription medicines only as told by your health care provider. Puncture site care  Follow instructions from your health care provider about how to take care of the site where catheters were inserted. Make sure you: ? Wash your hands with soap and water before you change your bandage (dressing). If soap and water are not available, use hand sanitizer. ? Change your dressing as told   by your health care provider. ? Leave stitches (sutures), skin glue, or adhesive strips in place. These skin closures may need to stay in place for 2 weeks or longer. If adhesive strip edges start to loosen and curl up, you may trim the loose edges. Do not remove adhesive strips completely unless your health  care provider tells you to do that.  Check your puncture area every day for signs of infection. Check for: ? Redness, swelling, or pain. ? Fluid or blood. ? Warmth. ? Pus or a bad smell. General instructions  Do not take baths, swim, or use a hot tub until your health care provider approves. Ask your health care provider if you may take showers. You may only be allowed to take sponge baths.  Monitor your dialysis fistula closely. Check to make sure that you can feel a vibration or buzz (a thrill) when you put your fingers over the fistula.  Prevent damage to your graft or fistula: ? Do not wear tight-fitting clothing or jewelry on the arm or leg that has your graft or fistula. ? Tell all your health care providers that you have a dialysis fistula or graft. ? Do not allow blood draws, IVs, or blood pressure readings to be done in the arm that has your fistula or graft. ? Do not allow flu shots or vaccinations in the arm with your fistula or graft.  Keep all follow-up visits as told by your health care provider. This is important. Contact a health care provider if:  You have redness, swelling, or pain at the site where the catheter was put in.  You have fluid or blood coming from the catheter site.  The catheter site feels warm to the touch.  You have pus or a bad smell coming from the catheter site.  You have a fever or chills. Get help right away if:  You feel weak.  You have trouble balancing.  You have trouble moving your arms or legs.  You have problems with your speech or vision.  You can no longer feel a vibration or buzz when you put your fingers over your dialysis fistula.  The limb that was used for the procedure: ? Swells. ? Is painful. ? Is cold. ? Is discolored, such as blue or pale white.  You have chest pain or shortness of breath. Summary  After a dialysis fistulogram, it is common to have a small amount of discomfort or bruising in the area where the  small, thin tube (catheter) was placed.  Rest at home on the day after your procedure. Return to your normal activities as told by your health care provider.  Take over-the-counter and prescription medicines only as told by your health care provider.  Follow instructions from your health care provider about how to take care of the site where the catheter was inserted.  Keep all follow-up visits as told by your health care provider. This information is not intended to replace advice given to you by your health care provider. Make sure you discuss any questions you have with your health care provider. Document Released: 08/25/2013 Document Revised: 05/11/2017 Document Reviewed: 05/11/2017 Elsevier Patient Education  2020 Else[employer] Inc.  

## 2019-01-22 NOTE — Op Note (Signed)
[facility]    OPERATIVE NOTE   PROCEDURE: 1.  Right radiocephalic arteriovenous fistula/ Artegraft jump graft cannulation under ultrasound guidance in both a retrograde and then antegrade fashion crossing 2.  Right arm fistulagram and central venogram 3.  Catheter directed thrombolysis with 4 mg of TPA delivered with the AngioJet AVX catheter 4.  Mechanical rheolytic thrombectomy to the right radiocephalic AV fistula and Artegraft jump graft with the AngioJet AVX catheter 5.  Fogarty embolectomy for residual arterial plug 6.  Percutaneous transluminal angioplasty of arterial anastomosis with 6 mm diameter angioplasty balloon 7.  Percutaneous transluminal angioplasty of the cephalic vein/median antecubital vein with 6 mm diameter angioplasty balloon 8.  Percutaneous transluminal angioplasty of the anastomosis of the cephalic vein to the Artegraft closer to the wrist with 8 mm diameter angioplasty balloon 9.  Covered stent placement of the cephalic vein/Artegraft anastomosis closer to the wrist with 8 mm diameter by 5 cm length Viabahn stent  PRE-OPERATIVE DIAGNOSIS: 1. ESRD 2.  Thrombosed right radiocephalic arteriovenous fistula/Artegraft jump graft  POST-OPERATIVE DIAGNOSIS: same as above   SURGEON: Leotis Pain, MD  ANESTHESIA: local with Moderate Conscious Sedation for 50 minutes using 5 mg of Versed and 125 mcg of Fentanyl  ESTIMATED BLOOD LOSS: 30 cc  FINDING(S): 1. Thrombosis of the fistula and the Artegraft jump graft  SPECIMEN(S):  None  CONTRAST: 65 cc  FLUORO TIME: 11.1 minutes  INDICATIONS: Patient is a 43 y.o.male who presents with a thrombosed right radiocephalic arteriovenous fistula/Artegraft jump graft.  The patient is scheduled for an attempted declot and shuntogram.  The patient is aware the risks include but are not limited to: bleeding, infection, thrombosis of the cannulated access, and possible anaphylactic reaction to the contrast.   The patient is aware of the risks of the procedure and elects to proceed forward.  DESCRIPTION: After full informed written consent was obtained, the patient was brought back to the angiography suite and placed supine upon the angiography table.  The patient was connected to monitoring equipment. Moderate conscious sedation was administered during a face to face encounter with the patient throughout the procedure with my supervision of the RN administering medicines and monitoring the patient's vital signs, pulse oximetry, telemetry and mental status throughout from the start of the procedure until the patient was taken to the recovery room. The right arm was prepped and draped in the standard fashion for a percutaneous access intervention.  Under ultrasound guidance, the right radiocephalic arteriovenous fistula/Artegraft jump graft was cannulated with a micropuncture needle under direct ultrasound guidance due to the pulseless nature of the fistula in both an antegrade and a retrograde fashion crossing, and permanent images were performed.  The microwire was advanced and the needle was exchanged for the a microsheath.  I then upsized to a 6 Fr Sheath and imaging was performed.  Hand injections were completed to image the access including the central venous system. This demonstrated thrombosis of the AVF.  Based on the images, this patient will need extensive treatment to salvage the graft. I then gave the patient 4000 units of intravenous heparin.  I then placed a Magic torque wire into the radial artery from the retrograde sheath and into the brachial vein from the antegrade sheath. 4 mg of TPA were deployed throughout the entirety of the fistula and into the brachial vein. This was allowed to dwell for 10-15 minutes. Mechanical rheolytic thrombectomy was then performed throughout the fistula and into the right brachial vein. This  uncovered stenosis of the median antecubital vein draining into the brachial  vein as well as stenosis of the anastomosis of the fistula to the Artegraft jump graft near to the wrist.  Both of these stenoses were greater than 75%.  A residual arterial plug was also seen at the arterial anastomosis. An attempt to clear the arterial plug was done with two passes of the Fogarty embolectomy balloon. Flow-limiting arterial plug remained, and I elected to treat this lesion with a 6 mm diameter high-pressure angioplasty balloon. This resulted in resolution of the arterial plug. The arterial outflow was seen to be intact through the radial artery as well on these images. . Mechanical rheolytic thrombectomy was performed both for the thrombus within the Artegraft jump graft as well as at the anastomosis to the cephalic vein near the wrist. This resulted in mild improvement.  I then elected to treat the stenosis of the median antecubital vein with the 6 mm diameter high-pressure angioplasty balloon inflated to 14 atm for 1 minute.  This resulted in less than 20% residual stenosis in the median antecubital vein.  The antegrade sheath was removed.  The anastomosis of the Artegraft to the cephalic vein near the wrist was treated with an 8 mm diameter by 6 cm length high-pressure angioplasty balloon inflated to 16 atm for 1 minute.  There remained residual stenosis of greater than 50% thrombus at this location so I elected to place a covered stent in this area.  We upsized to a 7 [place] sheath and a 0.018 wire and used an 8 mm diameter by 5 cm length via bond stent postdilated with an 8 mm balloon with excellent angiographic completion result and less than 10% residual stenosis.    Based on the completion imaging, no further intervention is necessary.  The wire and balloon were removed from the sheath.  A 4-0 Monocryl purse-string suture was sewn around the sheath.  The sheath was removed while tying down the suture.  A sterile bandage was applied to the puncture site.  COMPLICATIONS:  None  CONDITION: Stable   Leotis Pain 01/22/2019 3:37 PM   This note was created with Dragon Medical transcription system. Any errors in dictation are purely unintentional.

## 2019-01-22 NOTE — H&P (Signed)
[facility] History & Physical Update  The patient was interviewed and re-examined.  The patient's previous History and Physical has been reviewed and is unchanged.  His hyperkalemia precluded a declot on Monday, so he got a PermCath and got dialysis yesterday.  He is here for his declot today.  There is no change in the plan of care. We plan to proceed with the scheduled procedure.  Leotis Pain, MD  01/22/2019, 11:51 AM

## 2019-01-23 ENCOUNTER — Encounter: Payer: Self-pay | Admitting: Vascular Surgery

## 2019-01-24 LAB — SURGICAL PATHOLOGY

## 2019-01-29 ENCOUNTER — Encounter: Payer: Self-pay | Admitting: Anesthesiology

## 2019-01-29 ENCOUNTER — Other Ambulatory Visit: Payer: Self-pay

## 2019-01-29 ENCOUNTER — Ambulatory Visit: Payer: Medicare Other | Attending: Anesthesiology | Admitting: Anesthesiology

## 2019-01-29 DIAGNOSIS — G8929 Other chronic pain: Secondary | ICD-10-CM

## 2019-01-29 DIAGNOSIS — R29898 Other symptoms and signs involving the musculoskeletal system: Secondary | ICD-10-CM

## 2019-01-29 DIAGNOSIS — M47816 Spondylosis without myelopathy or radiculopathy, lumbar region: Secondary | ICD-10-CM | POA: Diagnosis not present

## 2019-01-29 DIAGNOSIS — G894 Chronic pain syndrome: Secondary | ICD-10-CM

## 2019-01-29 DIAGNOSIS — N186 End stage renal disease: Secondary | ICD-10-CM

## 2019-01-29 DIAGNOSIS — M545 Low back pain: Secondary | ICD-10-CM | POA: Diagnosis not present

## 2019-01-29 DIAGNOSIS — M5136 Other intervertebral disc degeneration, lumbar region: Secondary | ICD-10-CM

## 2019-01-29 DIAGNOSIS — G610 Guillain-Barre syndrome: Secondary | ICD-10-CM

## 2019-01-29 DIAGNOSIS — F1190 Opioid use, unspecified, uncomplicated: Secondary | ICD-10-CM

## 2019-01-29 DIAGNOSIS — Z992 Dependence on renal dialysis: Secondary | ICD-10-CM

## 2019-01-29 MED ORDER — OXYCODONE-ACETAMINOPHEN 5-325 MG PO TABS: 1.0000 | ORAL_TABLET | Freq: Four times a day (QID) | ORAL | 0 refills | Status: AC | PRN

## 2019-01-29 NOTE — Progress Notes (Signed)
Virtual Visit via Telephone Note  I connected with Patrick Downs on 01/29/19 at  2:00 PM EDT by telephone and verified that I am speaking with the correct person using two identifiers.  Location: Patient: Home Provider: Pain control center   I discussed the limitations, risks, security and privacy concerns of performing an evaluation and management service by telephone and the availability of in person appointments. I also discussed with the patient that there may be a patient responsible charge related to this service. The patient expressed understanding and agreed to proceed.   History of Present Illness: I spoke with [name] today regarding his low back pain.  No significant changes are reported in the quality characteristic or distribution of his pain.  He still continues to have severe pain throughout the day but this is improved with the opioid medications he is taking.  He states that these are continue to give him relief and he denies any change in lower extremity strength or function.  He does have some chronic residual weakness from his Patrick Downs.  He also reports that he had a recent catheterization for his end-stage renal disease.  His pain control has been good and he reports good results from the opioid medications with no side effects.  Otherwise he is in his usual state of health at this time.    Observations/Objective: Current Outpatient Medications:  .  acetaminophen (TYLENOL) 500 MG tablet, Take 1,000-1,500 mg by mouth 2 (two) times daily as needed for moderate pain or headache., Disp: , Rfl:  .  calcium acetate (PHOSLO) 667 MG capsule, Take 3 capsules (2,001 mg total) by mouth 3 (three) times daily with meals. (Patient taking differently: Take 1,334-2,001 mg by mouth 3 (three) times daily with meals. ), Disp: 1 capsule, Rfl: 0 .  gabapentin (NEURONTIN) 300 MG capsule, Take 300 mg by mouth at bedtime. , Disp: , Rfl:  .  midodrine (PROAMATINE) 5 MG tablet, Take 5 mg by  mouth See admin instructions. Take 2 tablets (10 mg) by mouth before dialysis on Tuesdays, Thursdays, & Saturday; Take 1 tablet (5 mg) by mouth in the evening on  Tuesdays, Thursdays, & Saturdays. On non-dialysis days take 1 tablet (5 mg) by mouth every 8 hours., Disp: , Rfl:  .  oxyCODONE-acetaminophen (PERCOCET) 5-325 MG tablet, Take 1 tablet by mouth every 6 (six) hours as needed for moderate pain or severe pain., Disp: 120 tablet, Rfl: 0 .  [START ON 02/28/2019] oxyCODONE-acetaminophen (PERCOCET) 5-325 MG tablet, Take 1 tablet by mouth every 6 (six) hours as needed for moderate pain or severe pain., Disp: 120 tablet, Rfl: 0 .  sildenafil (REVATIO) 20 MG tablet, Take 20-100 mg by mouth as needed (for ED). , Disp: , Rfl:  .  vancomycin IVPB, Inject into the vein. Gets three days a week at dialysis, Disp: , Rfl:    Assessment and Plan.d: 1. DDD (degenerative disc disease), lumbar   2. Chronic bilateral low back pain without sciatica   3. Facet arthritis of lumbar region   4. ESRD on dialysis ([place])   5. Guillain-Barre syndrome ([place])   6. Chronic pain syndrome   7. Chronic, continuous use of opioids   8. Weakness of both lower extremities   Based on our discussion today upon review of the [place] practitioner database information going to refill his medications dated for October 7 and November 6.  I want him to continue with his medicines and should he have any changes or problems  he is instructed to contact us at the pain control center.  I also want him to continue follow-up with his medical doctors for his baseline medical care.  He is scheduled for return for evaluation in 2 months.  Follow Up Instructions:    I discussed the assessment and treatment plan with the patient. The patient was provided an opportunity to ask questions and all were answered. The patient agreed with the plan and demonstrated an understanding of the instructions.   The patient was advised to call back or seek  an in-person evaluation if the symptoms worsen or if the condition fails to improve as anticipated.  I provided 30 minutes of non-face-to-face time during this encounter.   Molli Barrows, MD

## 2019-01-31 ENCOUNTER — Telehealth (INDEPENDENT_AMBULATORY_CARE_PROVIDER_SITE_OTHER): Payer: Self-pay

## 2019-01-31 NOTE — Telephone Encounter (Signed)
A fax was received from [facility] to scheduled the patient for permcath removal. Patient is now scheduled with Dr. Lucky Cowboy for PCR on 02/10/2019 with a 11:30 am arrival time to the [facility]. Patient will do his Covid testing on 02/06/2019 between 12:30-2:30 pm at the [facility]. The pre-procedure instructions will be faxed to [facility] attn: [place].

## 2019-02-06 ENCOUNTER — Other Ambulatory Visit: Admission: RE | Admit: 2019-02-06 | Payer: Medicare Other | Source: Ambulatory Visit

## 2019-02-09 ENCOUNTER — Other Ambulatory Visit (INDEPENDENT_AMBULATORY_CARE_PROVIDER_SITE_OTHER): Payer: Self-pay | Admitting: Nurse Practitioner

## 2019-02-10 ENCOUNTER — Ambulatory Visit: Admission: RE | Admit: 2019-02-10 | Payer: Medicare Other | Source: Home / Self Care | Admitting: Vascular Surgery

## 2019-02-10 ENCOUNTER — Encounter: Admission: RE | Payer: Self-pay | Source: Home / Self Care

## 2019-02-10 SURGERY — DIALYSIS/PERMA CATHETER REMOVAL
Anesthesia: LOCAL

## 2019-02-12 ENCOUNTER — Telehealth (INDEPENDENT_AMBULATORY_CARE_PROVIDER_SITE_OTHER): Payer: Self-pay

## 2019-02-12 NOTE — Telephone Encounter (Signed)
Misty from [facility] called wanting to reschedule the patient for a permcath removal. Patient is now rescheduled with Dr. Lucky Cowboy for 02/17/2019 with a 11:30 am arrival time to the [facility]. Patient will do his Covid testing on 02/14/2019 between 12:30-2:30 pm at the [facility]. The pre-procedure instructions will be faxed to [facility].

## 2019-02-14 ENCOUNTER — Other Ambulatory Visit: Admission: RE | Admit: 2019-02-14 | Payer: Medicare Other | Source: Ambulatory Visit

## 2019-02-16 ENCOUNTER — Other Ambulatory Visit (INDEPENDENT_AMBULATORY_CARE_PROVIDER_SITE_OTHER): Payer: Self-pay | Admitting: Nurse Practitioner

## 2019-02-17 ENCOUNTER — Other Ambulatory Visit (INDEPENDENT_AMBULATORY_CARE_PROVIDER_SITE_OTHER): Payer: Self-pay | Admitting: Vascular Surgery

## 2019-02-17 ENCOUNTER — Ambulatory Visit: Admission: RE | Admit: 2019-02-17 | Payer: Medicare Other | Source: Home / Self Care | Admitting: Vascular Surgery

## 2019-02-17 DIAGNOSIS — Z95820 Peripheral vascular angioplasty status with implants and grafts: Secondary | ICD-10-CM

## 2019-02-17 DIAGNOSIS — T82868S Thrombosis of vascular prosthetic devices, implants and grafts, sequela: Secondary | ICD-10-CM

## 2019-02-17 SURGERY — DIALYSIS/PERMA CATHETER REMOVAL
Anesthesia: LOCAL

## 2019-02-19 ENCOUNTER — Ambulatory Visit (INDEPENDENT_AMBULATORY_CARE_PROVIDER_SITE_OTHER): Payer: Medicare Other

## 2019-02-19 ENCOUNTER — Ambulatory Visit (INDEPENDENT_AMBULATORY_CARE_PROVIDER_SITE_OTHER): Payer: Medicare Other | Admitting: Nurse Practitioner

## 2019-02-19 ENCOUNTER — Encounter (INDEPENDENT_AMBULATORY_CARE_PROVIDER_SITE_OTHER): Payer: Self-pay | Admitting: Nurse Practitioner

## 2019-02-19 ENCOUNTER — Encounter (INDEPENDENT_AMBULATORY_CARE_PROVIDER_SITE_OTHER): Payer: Self-pay

## 2019-02-19 ENCOUNTER — Other Ambulatory Visit: Payer: Self-pay

## 2019-02-19 VITALS — BP 123/76 | HR 76 | Resp 16 | Wt 259.0 lb

## 2019-02-19 DIAGNOSIS — Z95820 Peripheral vascular angioplasty status with implants and grafts: Secondary | ICD-10-CM

## 2019-02-19 DIAGNOSIS — T82868S Thrombosis of vascular prosthetic devices, implants and grafts, sequela: Secondary | ICD-10-CM | POA: Diagnosis not present

## 2019-02-19 DIAGNOSIS — N186 End stage renal disease: Secondary | ICD-10-CM | POA: Diagnosis not present

## 2019-02-19 DIAGNOSIS — R011 Cardiac murmur, unspecified: Secondary | ICD-10-CM | POA: Insufficient documentation

## 2019-02-19 DIAGNOSIS — K219 Gastro-esophageal reflux disease without esophagitis: Secondary | ICD-10-CM

## 2019-02-19 DIAGNOSIS — I10 Essential (primary) hypertension: Secondary | ICD-10-CM

## 2019-02-19 DIAGNOSIS — Z992 Dependence on renal dialysis: Secondary | ICD-10-CM

## 2019-02-19 NOTE — Progress Notes (Signed)
SUBJECTIVE:  Patient ID: Patrick Downs, male    DOB: 1975-07-16, 43 y.o.   MRN: [id number] Chief Complaint  Patient presents with  . Follow-up    [facility] 4week ultrasound    HPI  Patrick Downs is a 43 y.o. male The patient returns to the office for followup status post intervention of the dialysis access radial artery jump graft. Following the intervention the access function has significantly improved, with better flow rates and improved KT/V. The patient has not been experiencing increased bleeding times following decannulation and the patient denies increased recirculation. The patient denies an increase in arm swelling. At the present time the patient denies hand pain.  The patient denies amaurosis fugax or recent TIA symptoms. There are no recent neurological changes noted. The patient denies claudication symptoms or rest pain symptoms. The patient denies history of DVT, PE or superficial thrombophlebitis. The patient denies recent episodes of angina or shortness of breath.   The patient has a flow volume of 1883.  The patient has a patent right radiocephalic/basilic AV fistula with patent Artegraft connection and patent new distal forearm stent.  No areas of hemodynamically significant velocity increases.     Past Medical History:  Diagnosis Date  . Depression   . Dialysis patient ([place])   . ESRD (end stage renal disease) on dialysis ([place])   . Failure to thrive in adult   . Gout   . Guillain Barr syndrome ([place])   . HTN (hypertension)   . Kidney failure   . Pneumonia   . Renal insufficiency   . Respiratory failure [facility])     Past Surgical History:  Procedure Laterality Date  . A/V FISTULAGRAM Right 01/07/2018   Procedure: A/V FISTULAGRAM;  Surgeon: Algernon Huxley, MD;  Location: [facility];  Service: Cardiovascular;  Laterality: Right;  . AMPUTATION TOE Right 06/08/2017   Procedure: AMPUTATION TOE RIGHT FIFTH TOE;  Surgeon: Samara Deist, DPM;  Location: [facility]  ORS;  Service: Podiatry;  Laterality: Right;  . AMPUTATION TOE Left 05/17/2018   Procedure: RAY LEFT 5TH;  Surgeon: Samara Deist, DPM;  Location: [facility] ORS;  Service: Podiatry;  Laterality: Left;  . AV FISTULA PLACEMENT     x5      2 graphs  . DIALYSIS/PERMA CATHETER INSERTION N/A 01/20/2019   Procedure: DIALYSIS/PERMA CATHETER INSERTION;  Surgeon: Algernon Huxley, MD;  Location: [place] [facility];  Service: Cardiovascular;  Laterality: N/A;  . DIALYSIS/PERMA CATHETER REMOVAL N/A 06/17/2018   Procedure: DIALYSIS/PERMA CATHETER REMOVAL;  Surgeon: Algernon Huxley, MD;  Location: [place] [facility];  Service: Cardiovascular;  Laterality: N/A;  . PARATHYROIDECTOMY    . PERIPHERAL VASCULAR THROMBECTOMY Right 01/22/2019   Procedure: PERIPHERAL VASCULAR THROMBECTOMY;  Surgeon: Algernon Huxley, MD;  Location: [facility] [facility];  Service: Cardiovascular;  Laterality: Right;  . RENAL BIOPSY    . REVISON OF ARTERIOVENOUS FISTULA Right 02/07/2018   Procedure: REVISON OF ARTERIOVENOUS FISTULA;  Surgeon: Algernon Huxley, MD;  Location: [facility] ORS;  Service: Vascular;  Laterality: Right;  . TEE WITHOUT CARDIOVERSION N/A 01/22/2018   Procedure: TRANSESOPHAGEAL ECHOCARDIOGRAM (TEE);  Surgeon: Dionisio David, MD;  Location: [facility] ORS;  Service: Cardiovascular;  Laterality: N/A;  . tonsiilectomy    . tracheotomy      Social History   Socioeconomic History  . Marital status: Divorced    Spouse name: Not on file  . Number of children: Not on file  . Years of education: Not  on file  . Highest education level: Not on file  Occupational History  . Not on file  Social Needs  . Financial resource strain: Not on file  . Food insecurity    Worry: Not on file    Inability: Not on file  . Transportation needs    Medical: Not on file    Non-medical: Not on file  Tobacco Use  . Smoking status: Never Smoker  . Smokeless tobacco: Never Used  Substance and Sexual Activity  . Alcohol use: No  . Drug use: Yes     Types: Marijuana    Comment: last time was June  . Sexual activity: Not on file    Comment: did not ask, mother present  Lifestyle  . Physical activity    Days per week: Not on file    Minutes per session: Not on file  . Stress: Not on file  Relationships  . Social Herbalist on phone: Not on file    Gets together: Not on file    Attends religious service: Not on file    Active member of club or organization: Not on file    Attends meetings of clubs or organizations: Not on file    Relationship status: Not on file  . Intimate partner violence    Fear of current or ex partner: Not on file    Emotionally abused: Not on file    Physically abused: Not on file    Forced sexual activity: Not on file  Other Topics Concern  . Not on file  Social History Narrative  . Not on file    Family History  Problem Relation Age of Onset  . Diabetes Mellitus II Father   . Kidney disease Father   . Kidney failure Paternal Grandfather   . Prostate cancer Neg Hx   . Kidney cancer Neg Hx   . Bladder Cancer Neg Hx     Allergies  Allergen Reactions  . Ondansetron Other (See Comments)    Stomach pain   . Minoxidil Other (See Comments)    "put fluid around my heart", PERICARDIAL EFFUSION  . Morphine And Related Other (See Comments)    Aggressive   . Omnipaque [Iohexol] Itching and Other (See Comments)    Rigors on one occasion, widespread itching on a separate occasion (resolved with Benadryl), tremors     Review of Systems   Review of Systems: Negative Unless Checked Constitutional: [] Weight loss  [] Fever  [] Chills Cardiac: [] Chest pain   []  Atrial Fibrillation  [] Palpitations   [] Shortness of breath when laying flat   [] Shortness of breath with exertion. [] Shortness of breath at rest Vascular:  [] Pain in legs with walking   [] Pain in legs with standing [] Pain in legs when laying flat   [] Claudication    [] Pain in feet when laying flat    [] History of DVT   [] Phlebitis    [] Swelling in legs   [] Varicose veins   [] Non-healing ulcers Pulmonary:   [] Uses home oxygen   [] Productive cough   [] Hemoptysis   [] Wheeze  [] COPD   [] Asthma Neurologic:  [] Dizziness   [] Seizures  [] Blackouts [] History of stroke   [] History of TIA  [] Aphasia   [] Temporary Blindness   [] Weakness or numbness in arm   [] Weakness or numbness in leg Musculoskeletal:   [] Joint swelling   [] Joint pain   [] Low back pain  []  History of Knee Replacement [] Arthritis [] back Surgeries  []  Spinal Stenosis    Hematologic:  []   Easy bruising  [] Easy bleeding   [] Hypercoagulable state   [x] Anemic Gastrointestinal:  [] Diarrhea   [] Vomiting  [x] Gastroesophageal reflux/heartburn   [] Difficulty swallowing. [] Abdominal pain Genitourinary:  [x] Chronic kidney disease   [] Difficult urination  [] Anuric   [] Blood in urine [] Frequent urination  [] Burning with urination   [] Hematuria Skin:  [] Rashes   [] Ulcers [x] Wounds Psychological:  [] History of anxiety   []  History of major depression  []  Memory Difficulties      OBJECTIVE:   Physical Exam  BP 123/76 (BP Location: Left Arm)   Pulse 76   Resp 16   Wt 259 lb (117.5 kg)   BMI 33.25 kg/m   Gen: WD/WN, NAD Head: [place]/AT, No temporalis wasting.  Ear/Nose/Throat: Hearing grossly intact, nares w/o erythema or drainage Eyes: PER, EOMI, sclera nonicteric.  Neck: Supple, no masses.  No JVD.  Pulmonary:  Good air movement, no use of accessory muscles.  Cardiac: RRR Vascular:  Soft thrill good bruit Vessel Right Left  Radial Palpable Palpable   Gastrointestinal: soft, non-distended. No guarding/no peritoneal signs.  Musculoskeletal: M/S 5/5 throughout.  No deformity or atrophy.  Neurologic: Pain and light touch intact in extremities.  Symmetrical.  Speech is fluent. Motor exam as listed above. Psychiatric: Judgment intact, Mood & affect appropriate for pt's clinical situation. Dermatologic: No Venous rashes. No Ulcers Noted.  No changes consistent with cellulitis.  Lymph : No Cervical lymphadenopathy, no lichenification or skin changes of chronic lymphedema.       ASSESSMENT AND PLAN:  1. ESRD on dialysis [facility]) Recommend:  The patient is doing well and currently has adequate dialysis access. The patient's dialysis center is not reporting any access issues. Flow pattern is stable when compared to the prior ultrasound.  The patient should have a duplex ultrasound of the dialysis access in 6 months. The patient will follow-up with me in the office after each ultrasound    - VAS [place] (AVF, AVG); Future  2. Gastroesophageal reflux disease, unspecified whether esophagitis present Continue PPI as already ordered, this medication has been reviewed and there are no changes at this time.  Avoidence of caffeine and alcohol  Moderate elevation of the head of the bed   3. Essential hypertension Continue antihypertensive medications as already ordered, these medications have been reviewed and there are no changes at this time.    Current Outpatient Medications on File Prior to Visit  Medication Sig Dispense Refill  . acetaminophen (TYLENOL) 500 MG tablet Take 1,000-1,500 mg by mouth 2 (two) times daily as needed for moderate pain or headache.    . calcium acetate (PHOSLO) 667 MG capsule Take 3 capsules (2,001 mg total) by mouth 3 (three) times daily with meals. (Patient taking differently: Take 1,334-2,001 mg by mouth 3 (three) times daily with meals. ) 1 capsule 0  . gabapentin (NEURONTIN) 300 MG capsule Take 300 mg by mouth at bedtime.     . midodrine (PROAMATINE) 5 MG tablet Take 5 mg by mouth See admin instructions. Take 2 tablets (10 mg) by mouth before dialysis on Tuesdays, Thursdays, & Saturday; Take 1 tablet (5 mg) by mouth in the evening on  Tuesdays, Thursdays, & Saturdays. On non-dialysis days take 1 tablet (5 mg) by mouth every 8 hours.    Marland Kitchen oxyCODONE-acetaminophen (PERCOCET) 5-325 MG tablet Take 1 tablet by mouth every  6 (six) hours as needed for moderate pain or severe pain. 120 tablet 0  . [START ON 02/28/2019] oxyCODONE-acetaminophen (PERCOCET) 5-325 MG tablet Take 1  tablet by mouth every 6 (six) hours as needed for moderate pain or severe pain. 120 tablet 0  . sildenafil (REVATIO) 20 MG tablet Take 20-100 mg by mouth as needed (for ED).     Marland Kitchen vancomycin IVPB Inject into the vein. Gets three days a week at dialysis     No current facility-administered medications on file prior to visit.     There are no Patient Instructions on file for this visit. No follow-ups on file.   Kris Hartmann, NP  This note was completed with Sales executive.  Any errors are purely unintentional.

## 2019-03-27 ENCOUNTER — Telehealth: Payer: Self-pay | Admitting: *Deleted

## 2019-03-27 NOTE — Telephone Encounter (Signed)
Called patient, told him I do not see any record of anyone trying to call him. However, he does have an appt on 04/01/19 and perhaps someone was calling to remind him of that. Patient verbalizes u/o VV appt on 04/01/19

## 2019-04-01 ENCOUNTER — Ambulatory Visit: Payer: Medicare Other | Attending: Anesthesiology | Admitting: Anesthesiology

## 2019-04-01 ENCOUNTER — Encounter: Payer: Self-pay | Admitting: Anesthesiology

## 2019-04-01 ENCOUNTER — Other Ambulatory Visit: Payer: Self-pay

## 2019-04-01 DIAGNOSIS — Z992 Dependence on renal dialysis: Secondary | ICD-10-CM

## 2019-04-01 DIAGNOSIS — G8929 Other chronic pain: Secondary | ICD-10-CM

## 2019-04-01 DIAGNOSIS — M47816 Spondylosis without myelopathy or radiculopathy, lumbar region: Secondary | ICD-10-CM | POA: Diagnosis not present

## 2019-04-01 DIAGNOSIS — N186 End stage renal disease: Secondary | ICD-10-CM | POA: Diagnosis not present

## 2019-04-01 DIAGNOSIS — G610 Guillain-Barre syndrome: Secondary | ICD-10-CM

## 2019-04-01 DIAGNOSIS — M5136 Other intervertebral disc degeneration, lumbar region: Secondary | ICD-10-CM

## 2019-04-01 DIAGNOSIS — G894 Chronic pain syndrome: Secondary | ICD-10-CM

## 2019-04-01 DIAGNOSIS — F1190 Opioid use, unspecified, uncomplicated: Secondary | ICD-10-CM

## 2019-04-01 DIAGNOSIS — M545 Low back pain: Secondary | ICD-10-CM

## 2019-04-01 DIAGNOSIS — R29898 Other symptoms and signs involving the musculoskeletal system: Secondary | ICD-10-CM

## 2019-04-01 MED ORDER — OXYCODONE-ACETAMINOPHEN 5-325 MG PO TABS: 1.0000 | ORAL_TABLET | Freq: Four times a day (QID) | ORAL | 0 refills | Status: AC | PRN

## 2019-04-01 NOTE — Progress Notes (Signed)
Virtual Visit via Telephone Note  I connected with Patrick Downs on 04/01/19 at 11:30 AM EST by telephone and verified that I am speaking with the correct person using two identifiers.  Location: Patient: Home Provider: Pain control center   I discussed the limitations, risks, security and privacy concerns of performing an evaluation and management service by telephone and the availability of in person appointments. I also discussed with the patient that there may be a patient responsible charge related to this service. The patient expressed understanding and agreed to proceed.   History of Present Illness: I spoke with Patrick Downs today via telephone.  We attempted virtual conferencing via video with out success.  I was able to touch base with him via telephone and he reports that he has been doing reasonably well with his low back pain and leg pain.  He has been working more and is constantly standing on concrete floors throughout the day which has worsened his low back pain.  He tries to do his stretching with limited success but the quality characteristic and distribution of his pain have been stable in nature.  He is taking his Percocet 4 times a day at the 5 mg strength and these do help but with the increased severity of his pain they are less effective.  Otherwise he is in his usual state of health with no changes in lower extremity strength function or bowel or bladder function.  Everything else has been about the same.    Observations/Objective:  Current Outpatient Medications:  .  acetaminophen (TYLENOL) 500 MG tablet, Take 1,000-1,500 mg by mouth 2 (two) times daily as needed for moderate pain or headache., Disp: , Rfl:  .  calcium acetate (PHOSLO) 667 MG capsule, Take 3 capsules (2,001 mg total) by mouth 3 (three) times daily with meals. (Patient taking differently: Take 1,334-2,001 mg by mouth 3 (three) times daily with meals. ), Disp: 1 capsule, Rfl: 0 .  gabapentin (NEURONTIN) 300  MG capsule, Take 300 mg by mouth at bedtime. , Disp: , Rfl:  .  midodrine (PROAMATINE) 5 MG tablet, Take 5 mg by mouth See admin instructions. Take 2 tablets (10 mg) by mouth before dialysis on Tuesdays, Thursdays, & Saturday; Take 1 tablet (5 mg) by mouth in the evening on  Tuesdays, Thursdays, & Saturdays. On non-dialysis days take 1 tablet (5 mg) by mouth every 8 hours., Disp: , Rfl:  .  oxyCODONE-acetaminophen (PERCOCET) 5-325 MG tablet, Take 1 tablet by mouth every 6 (six) hours as needed for moderate pain or severe pain., Disp: 120 tablet, Rfl: 0 .  [START ON 05/01/2019] oxyCODONE-acetaminophen (PERCOCET) 5-325 MG tablet, Take 1 tablet by mouth every 6 (six) hours as needed for moderate pain or severe pain., Disp: 120 tablet, Rfl: 0 .  sildenafil (REVATIO) 20 MG tablet, Take 20-100 mg by mouth as needed (for ED). , Disp: , Rfl:  .  vancomycin IVPB, Inject into the vein. Gets three days a week at dialysis, Disp: , Rfl:   Assessment and Plan: 1. DDD (degenerative disc disease), lumbar   2. Chronic bilateral low back pain without sciatica   3. Facet arthritis of lumbar region   4. ESRD on dialysis ([place])   5. Guillain-Barre syndrome ([place])   6. Chronic pain syndrome   7. Chronic, continuous use of opioids   8. Weakness of both lower extremities   Based on our discussion today and upon review of the [facility] practitioner database information I am going  to refill his medications for December right January 7 of 2021.  This will be for 4 times daily dosing of the Percocet 5 mg tablets.  I want him to continue with his back stretching strengthening exercises as discussed today with a virtual return in 2 months for reevaluation.  At that point depending on the severity of his pain we may contemplate an increase to the 7.5 mg tablet if necessary.  I do not feel he is an injection candidate.  Furthermore he has failed more conservative therapy but continues to show good relief and success with opioid  management.  He has been compliant with this regimen.  I want him to continue follow-up with his primary care physicians for his baseline medical care.  Follow Up Instructions:    I discussed the assessment and treatment plan with the patient. The patient was provided an opportunity to ask questions and all were answered. The patient agreed with the plan and demonstrated an understanding of the instructions.   The patient was advised to call back or seek an in-person evaluation if the symptoms worsen or if the condition fails to improve as anticipated.  I provided 30 minutes of non-face-to-face time during this encounter.   Molli Barrows, MD

## 2019-04-11 ENCOUNTER — Telehealth (INDEPENDENT_AMBULATORY_CARE_PROVIDER_SITE_OTHER): Payer: Self-pay

## 2019-04-11 NOTE — Telephone Encounter (Signed)
Misty from [facility] faxed over an order for the patient to have a permcath removal. Patient is scheduled with Dr. Delana Meyer for a permcath removal on 04/16/2019 with a 9:00 am arrival time to the [facility]. Patient will do covid testing on 04/14/2019 between 12:30-2:30 pm at the [place]. Pre-procedure instructions will be faxed to [facility] at [facility].

## 2019-04-15 ENCOUNTER — Other Ambulatory Visit (INDEPENDENT_AMBULATORY_CARE_PROVIDER_SITE_OTHER): Payer: Self-pay | Admitting: Nurse Practitioner

## 2019-04-16 ENCOUNTER — Ambulatory Visit: Admission: RE | Admit: 2019-04-16 | Payer: Medicare Other | Source: Home / Self Care | Admitting: Vascular Surgery

## 2019-04-16 ENCOUNTER — Encounter: Admission: RE | Payer: Self-pay | Source: Home / Self Care

## 2019-04-16 SURGERY — DIALYSIS/PERMA CATHETER REMOVAL
Anesthesia: LOCAL

## 2019-04-22 ENCOUNTER — Telehealth (INDEPENDENT_AMBULATORY_CARE_PROVIDER_SITE_OTHER): Payer: Self-pay

## 2019-04-22 NOTE — Telephone Encounter (Signed)
A fax was received from [facility] regarding the patient have a permcath removal. Patient is scheduled with Dr. Lucky Cowboy on 04/30/19 with a 12:30 pm arrival time to the [facility]. Patient will do covid testing on 04/28/19 between 12:30-2:30 pm at the [place]. Pre-procedure instructions as well as dates and times will be faxed back to [facility] as requested.

## 2019-04-27 DIAGNOSIS — N186 End stage renal disease: Secondary | ICD-10-CM | POA: Diagnosis not present

## 2019-04-27 DIAGNOSIS — Z992 Dependence on renal dialysis: Secondary | ICD-10-CM | POA: Diagnosis not present

## 2019-04-27 DIAGNOSIS — N2581 Secondary hyperparathyroidism of renal origin: Secondary | ICD-10-CM | POA: Diagnosis not present

## 2019-04-28 ENCOUNTER — Other Ambulatory Visit: Admission: RE | Admit: 2019-04-28 | Payer: Medicaid Other | Source: Ambulatory Visit

## 2019-04-30 ENCOUNTER — Other Ambulatory Visit (INDEPENDENT_AMBULATORY_CARE_PROVIDER_SITE_OTHER): Payer: Self-pay | Admitting: Nurse Practitioner

## 2019-04-30 ENCOUNTER — Ambulatory Visit: Admission: RE | Admit: 2019-04-30 | Payer: Medicare HMO | Source: Home / Self Care | Admitting: Vascular Surgery

## 2019-04-30 SURGERY — DIALYSIS/PERMA CATHETER REMOVAL
Anesthesia: LOCAL

## 2019-05-01 DIAGNOSIS — N2581 Secondary hyperparathyroidism of renal origin: Secondary | ICD-10-CM | POA: Diagnosis not present

## 2019-05-01 DIAGNOSIS — Z992 Dependence on renal dialysis: Secondary | ICD-10-CM | POA: Diagnosis not present

## 2019-05-01 DIAGNOSIS — N186 End stage renal disease: Secondary | ICD-10-CM | POA: Diagnosis not present

## 2019-05-05 DIAGNOSIS — N2581 Secondary hyperparathyroidism of renal origin: Secondary | ICD-10-CM | POA: Diagnosis not present

## 2019-05-05 DIAGNOSIS — N186 End stage renal disease: Secondary | ICD-10-CM | POA: Diagnosis not present

## 2019-05-05 DIAGNOSIS — Z992 Dependence on renal dialysis: Secondary | ICD-10-CM | POA: Diagnosis not present

## 2019-05-06 DIAGNOSIS — N186 End stage renal disease: Secondary | ICD-10-CM | POA: Diagnosis not present

## 2019-05-06 DIAGNOSIS — N2581 Secondary hyperparathyroidism of renal origin: Secondary | ICD-10-CM | POA: Diagnosis not present

## 2019-05-06 DIAGNOSIS — Z992 Dependence on renal dialysis: Secondary | ICD-10-CM | POA: Diagnosis not present

## 2019-05-08 DIAGNOSIS — Z992 Dependence on renal dialysis: Secondary | ICD-10-CM | POA: Diagnosis not present

## 2019-05-08 DIAGNOSIS — N186 End stage renal disease: Secondary | ICD-10-CM | POA: Diagnosis not present

## 2019-05-08 DIAGNOSIS — N2581 Secondary hyperparathyroidism of renal origin: Secondary | ICD-10-CM | POA: Diagnosis not present

## 2019-05-09 DIAGNOSIS — T82868A Thrombosis of vascular prosthetic devices, implants and grafts, initial encounter: Secondary | ICD-10-CM | POA: Diagnosis not present

## 2019-05-09 DIAGNOSIS — N186 End stage renal disease: Secondary | ICD-10-CM | POA: Diagnosis not present

## 2019-05-10 DIAGNOSIS — N2581 Secondary hyperparathyroidism of renal origin: Secondary | ICD-10-CM | POA: Diagnosis not present

## 2019-05-10 DIAGNOSIS — Z992 Dependence on renal dialysis: Secondary | ICD-10-CM | POA: Diagnosis not present

## 2019-05-10 DIAGNOSIS — N186 End stage renal disease: Secondary | ICD-10-CM | POA: Diagnosis not present

## 2019-05-13 DIAGNOSIS — N2581 Secondary hyperparathyroidism of renal origin: Secondary | ICD-10-CM | POA: Diagnosis not present

## 2019-05-13 DIAGNOSIS — Z992 Dependence on renal dialysis: Secondary | ICD-10-CM | POA: Diagnosis not present

## 2019-05-13 DIAGNOSIS — N186 End stage renal disease: Secondary | ICD-10-CM | POA: Diagnosis not present

## 2019-05-15 DIAGNOSIS — Z992 Dependence on renal dialysis: Secondary | ICD-10-CM | POA: Diagnosis not present

## 2019-05-15 DIAGNOSIS — N186 End stage renal disease: Secondary | ICD-10-CM | POA: Diagnosis not present

## 2019-05-15 DIAGNOSIS — N2581 Secondary hyperparathyroidism of renal origin: Secondary | ICD-10-CM | POA: Diagnosis not present

## 2019-05-17 DIAGNOSIS — N186 End stage renal disease: Secondary | ICD-10-CM | POA: Diagnosis not present

## 2019-05-17 DIAGNOSIS — Z992 Dependence on renal dialysis: Secondary | ICD-10-CM | POA: Diagnosis not present

## 2019-05-17 DIAGNOSIS — N2581 Secondary hyperparathyroidism of renal origin: Secondary | ICD-10-CM | POA: Diagnosis not present

## 2019-05-20 DIAGNOSIS — N186 End stage renal disease: Secondary | ICD-10-CM | POA: Diagnosis not present

## 2019-05-20 DIAGNOSIS — Z992 Dependence on renal dialysis: Secondary | ICD-10-CM | POA: Diagnosis not present

## 2019-05-20 DIAGNOSIS — N2581 Secondary hyperparathyroidism of renal origin: Secondary | ICD-10-CM | POA: Diagnosis not present

## 2019-05-22 DIAGNOSIS — N186 End stage renal disease: Secondary | ICD-10-CM | POA: Diagnosis not present

## 2019-05-22 DIAGNOSIS — N2581 Secondary hyperparathyroidism of renal origin: Secondary | ICD-10-CM | POA: Diagnosis not present

## 2019-05-22 DIAGNOSIS — Z992 Dependence on renal dialysis: Secondary | ICD-10-CM | POA: Diagnosis not present

## 2019-05-24 DIAGNOSIS — N2581 Secondary hyperparathyroidism of renal origin: Secondary | ICD-10-CM | POA: Diagnosis not present

## 2019-05-24 DIAGNOSIS — N186 End stage renal disease: Secondary | ICD-10-CM | POA: Diagnosis not present

## 2019-05-24 DIAGNOSIS — Z992 Dependence on renal dialysis: Secondary | ICD-10-CM | POA: Diagnosis not present

## 2019-05-25 DIAGNOSIS — N186 End stage renal disease: Secondary | ICD-10-CM | POA: Diagnosis not present

## 2019-05-25 DIAGNOSIS — Z992 Dependence on renal dialysis: Secondary | ICD-10-CM | POA: Diagnosis not present

## 2019-05-25 DIAGNOSIS — N051 Unspecified nephritic syndrome with focal and segmental glomerular lesions: Secondary | ICD-10-CM | POA: Diagnosis not present

## 2019-05-27 DIAGNOSIS — Z992 Dependence on renal dialysis: Secondary | ICD-10-CM | POA: Diagnosis not present

## 2019-05-27 DIAGNOSIS — N186 End stage renal disease: Secondary | ICD-10-CM | POA: Diagnosis not present

## 2019-05-27 DIAGNOSIS — N2581 Secondary hyperparathyroidism of renal origin: Secondary | ICD-10-CM | POA: Diagnosis not present

## 2019-05-29 DIAGNOSIS — Z992 Dependence on renal dialysis: Secondary | ICD-10-CM | POA: Diagnosis not present

## 2019-05-29 DIAGNOSIS — N2581 Secondary hyperparathyroidism of renal origin: Secondary | ICD-10-CM | POA: Diagnosis not present

## 2019-05-29 DIAGNOSIS — N186 End stage renal disease: Secondary | ICD-10-CM | POA: Diagnosis not present

## 2019-05-31 DIAGNOSIS — Z992 Dependence on renal dialysis: Secondary | ICD-10-CM | POA: Diagnosis not present

## 2019-05-31 DIAGNOSIS — N186 End stage renal disease: Secondary | ICD-10-CM | POA: Diagnosis not present

## 2019-05-31 DIAGNOSIS — N2581 Secondary hyperparathyroidism of renal origin: Secondary | ICD-10-CM | POA: Diagnosis not present

## 2019-06-03 DIAGNOSIS — Z992 Dependence on renal dialysis: Secondary | ICD-10-CM | POA: Diagnosis not present

## 2019-06-03 DIAGNOSIS — N2581 Secondary hyperparathyroidism of renal origin: Secondary | ICD-10-CM | POA: Diagnosis not present

## 2019-06-03 DIAGNOSIS — N186 End stage renal disease: Secondary | ICD-10-CM | POA: Diagnosis not present

## 2019-06-05 ENCOUNTER — Ambulatory Visit: Payer: Medicare HMO | Attending: Anesthesiology | Admitting: Anesthesiology

## 2019-06-05 ENCOUNTER — Other Ambulatory Visit: Payer: Self-pay

## 2019-06-05 DIAGNOSIS — M47816 Spondylosis without myelopathy or radiculopathy, lumbar region: Secondary | ICD-10-CM | POA: Diagnosis not present

## 2019-06-05 DIAGNOSIS — R29898 Other symptoms and signs involving the musculoskeletal system: Secondary | ICD-10-CM | POA: Diagnosis not present

## 2019-06-05 DIAGNOSIS — Z992 Dependence on renal dialysis: Secondary | ICD-10-CM

## 2019-06-05 DIAGNOSIS — M5136 Other intervertebral disc degeneration, lumbar region: Secondary | ICD-10-CM

## 2019-06-05 DIAGNOSIS — G610 Guillain-Barre syndrome: Secondary | ICD-10-CM

## 2019-06-05 DIAGNOSIS — G894 Chronic pain syndrome: Secondary | ICD-10-CM | POA: Diagnosis not present

## 2019-06-05 DIAGNOSIS — F1190 Opioid use, unspecified, uncomplicated: Secondary | ICD-10-CM

## 2019-06-05 DIAGNOSIS — G8929 Other chronic pain: Secondary | ICD-10-CM

## 2019-06-05 DIAGNOSIS — M545 Low back pain: Secondary | ICD-10-CM

## 2019-06-05 DIAGNOSIS — N186 End stage renal disease: Secondary | ICD-10-CM | POA: Diagnosis not present

## 2019-06-05 DIAGNOSIS — N2581 Secondary hyperparathyroidism of renal origin: Secondary | ICD-10-CM | POA: Diagnosis not present

## 2019-06-05 NOTE — Progress Notes (Signed)
Virtual Visit via Video Note  I connected with Patrick Downs on 06/05/19 at  2:45 PM EST by a video enabled telemedicine application and verified that I am speaking with the correct person using two identifiers.  Location: Patient: Home Provider: Pain control center   I discussed the limitations of evaluation and management by telemedicine and the availability of in person appointments. The patient expressed understanding and agreed to proceed.  History of Present Illness: I have made multiple attempts to contact [name] via the home phone and left a message 2 times on his recorder in an effort to touch base.  I have gone ahead and released his medications for the next 2 months as he has been out for the past 5 days.  The message I left on his cell phone request that he reach out to Patrick Downs this evening or first thing Monday morning.  Update 1722 [id number].  [name] did respond to the voicemail and maintains that he is still having considerable low back pain and the 5 mg oxycodone tablet has been insufficient secondary to the severity of his pain.  In the past he has been on a 10 mg tablet with better success taking this 4 times a day.  He has been compliant with our regimen with no evidence of any diverting or illicit use as a patient of the pain control center.  The quality of the pain is similar to what he is experienced all along but the intensity has been worse with his work schedule as of late.  He has been out of medication for 5 days he reports.  No withdrawal type symptoms are described.  Otherwise he is in his usual state of health no change in lower extremity strength or function is mentioned either.    Observations/Objective:  Current Outpatient Medications:  .  acetaminophen (TYLENOL) 500 MG tablet, Take 1,000-1,500 mg by mouth 2 (two) times daily as needed for moderate pain or headache., Disp: , Rfl:  .  calcium acetate (PHOSLO) 667 MG capsule, Take 3 capsules (2,001 mg total) by mouth  3 (three) times daily with meals. (Patient taking differently: Take 1,334-2,001 mg by mouth 3 (three) times daily with meals. ), Disp: 1 capsule, Rfl: 0 .  gabapentin (NEURONTIN) 300 MG capsule, Take 600 mg by mouth at bedtime. , Disp: , Rfl:  .  midodrine (PROAMATINE) 5 MG tablet, Take 5 mg by mouth See admin instructions. Take 2 tablets (10 mg) by mouth before dialysis on Tuesdays, Thursdays, & Saturday; Take 1 tablet (5 mg) by mouth in the evening on  Tuesdays, Thursdays, & Saturdays. On non-dialysis days take 1 tablet (5 mg) by mouth every 8 hours., Disp: , Rfl:  .  oxyCODONE-acetaminophen (PERCOCET) 5-325 MG tablet, Take 1 tablet by mouth every 6 (six) hours as needed for moderate pain or severe pain., Disp: 120 tablet, Rfl: 0 .  [START ON 07/04/2019] oxyCODONE-acetaminophen (PERCOCET) 5-325 MG tablet, Take 1 tablet by mouth every 6 (six) hours as needed for moderate pain or severe pain., Disp: 120 tablet, Rfl: 0 .  sildenafil (REVATIO) 20 MG tablet, Take 20-100 mg by mouth as needed (for ED). , Disp: , Rfl:  .  vancomycin IVPB, Inject into the vein. Gets three days a week at dialysis, Disp: , Rfl:   Assessment and Plan: 1. DDD (degenerative disc disease), lumbar   2. Chronic bilateral low back pain without sciatica   3. Facet arthritis of lumbar region   4. ESRD on dialysis (  [place])   5. Guillain-Barre syndrome ([place])   6. Chronic pain syndrome   7. Chronic, continuous use of opioids   8. Weakness of both lower extremities   I have reviewed the [facility] practitioner database information and it appears appropriate.  Refills will be given for February 11 and March 12.  The patient has been informed of this with a message left on his cellular phone voicemail.  I was unable to contact [name] otherwise.  Based on this new discussion canceling his 2 5 mg tablet Percocet prescriptions for the next 2 months and switching him over to a 10 mg tablet to be taken 4 times a day.  We have gone over  the risks and benefits of the medication change with him.  I will schedule him for return to clinic in 1 month.  Follow Up Instructions:    I discussed the assessment and treatment plan with the patient. The patient was provided an opportunity to ask questions and all were answered. The patient agreed with the plan and demonstrated an understanding of the instructions.   The patient was advised to call back or seek an in-person evaluation if the symptoms worsen or if the condition fails to improve as anticipated.  I provided 30 minutes of non-face-to-face time during this encounter.   Molli Barrows, MD

## 2019-06-06 ENCOUNTER — Telehealth: Payer: Self-pay | Admitting: Anesthesiology

## 2019-06-06 ENCOUNTER — Encounter: Payer: Self-pay | Admitting: Anesthesiology

## 2019-06-06 MED ORDER — OXYCODONE-ACETAMINOPHEN 5-325 MG PO TABS: 1.0000 | ORAL_TABLET | Freq: Four times a day (QID) | ORAL | 0 refills | Status: DC | PRN

## 2019-06-06 MED ORDER — OXYCODONE-ACETAMINOPHEN 10-325 MG PO TABS: 1.0000 | ORAL_TABLET | Freq: Four times a day (QID) | ORAL | 0 refills | Status: AC | PRN

## 2019-06-06 NOTE — Telephone Encounter (Signed)
Patient lvmail on Thursday and has called 2 times today... says he did not get his virtual call yesterday. Has been out of meds for 5 days. I sent bubble msg to Dr. Andree Elk and also a text to his phone. Tried to page him but it just rings busy.

## 2019-06-06 NOTE — Telephone Encounter (Signed)
Juliann Pulse spoke to Dr Andree Elk and states that he will take care of this situation.

## 2019-06-07 DIAGNOSIS — Z992 Dependence on renal dialysis: Secondary | ICD-10-CM | POA: Diagnosis not present

## 2019-06-07 DIAGNOSIS — N2581 Secondary hyperparathyroidism of renal origin: Secondary | ICD-10-CM | POA: Diagnosis not present

## 2019-06-07 DIAGNOSIS — N186 End stage renal disease: Secondary | ICD-10-CM | POA: Diagnosis not present

## 2019-06-10 DIAGNOSIS — Z992 Dependence on renal dialysis: Secondary | ICD-10-CM | POA: Diagnosis not present

## 2019-06-10 DIAGNOSIS — N2581 Secondary hyperparathyroidism of renal origin: Secondary | ICD-10-CM | POA: Diagnosis not present

## 2019-06-10 DIAGNOSIS — N186 End stage renal disease: Secondary | ICD-10-CM | POA: Diagnosis not present

## 2019-06-12 DIAGNOSIS — N2581 Secondary hyperparathyroidism of renal origin: Secondary | ICD-10-CM | POA: Diagnosis not present

## 2019-06-12 DIAGNOSIS — N186 End stage renal disease: Secondary | ICD-10-CM | POA: Diagnosis not present

## 2019-06-12 DIAGNOSIS — Z992 Dependence on renal dialysis: Secondary | ICD-10-CM | POA: Diagnosis not present

## 2019-06-14 DIAGNOSIS — N186 End stage renal disease: Secondary | ICD-10-CM | POA: Diagnosis not present

## 2019-06-14 DIAGNOSIS — Z992 Dependence on renal dialysis: Secondary | ICD-10-CM | POA: Diagnosis not present

## 2019-06-14 DIAGNOSIS — N2581 Secondary hyperparathyroidism of renal origin: Secondary | ICD-10-CM | POA: Diagnosis not present

## 2019-06-17 DIAGNOSIS — N186 End stage renal disease: Secondary | ICD-10-CM | POA: Diagnosis not present

## 2019-06-17 DIAGNOSIS — Z992 Dependence on renal dialysis: Secondary | ICD-10-CM | POA: Diagnosis not present

## 2019-06-17 DIAGNOSIS — N2581 Secondary hyperparathyroidism of renal origin: Secondary | ICD-10-CM | POA: Diagnosis not present

## 2019-06-19 DIAGNOSIS — N2581 Secondary hyperparathyroidism of renal origin: Secondary | ICD-10-CM | POA: Diagnosis not present

## 2019-06-19 DIAGNOSIS — Z992 Dependence on renal dialysis: Secondary | ICD-10-CM | POA: Diagnosis not present

## 2019-06-19 DIAGNOSIS — N186 End stage renal disease: Secondary | ICD-10-CM | POA: Diagnosis not present

## 2019-06-20 DIAGNOSIS — Z992 Dependence on renal dialysis: Secondary | ICD-10-CM | POA: Diagnosis not present

## 2019-06-20 DIAGNOSIS — N2581 Secondary hyperparathyroidism of renal origin: Secondary | ICD-10-CM | POA: Diagnosis not present

## 2019-06-20 DIAGNOSIS — N186 End stage renal disease: Secondary | ICD-10-CM | POA: Diagnosis not present

## 2019-06-22 DIAGNOSIS — N186 End stage renal disease: Secondary | ICD-10-CM | POA: Diagnosis not present

## 2019-06-22 DIAGNOSIS — Z992 Dependence on renal dialysis: Secondary | ICD-10-CM | POA: Diagnosis not present

## 2019-06-22 DIAGNOSIS — N051 Unspecified nephritic syndrome with focal and segmental glomerular lesions: Secondary | ICD-10-CM | POA: Diagnosis not present

## 2019-06-24 DIAGNOSIS — Z992 Dependence on renal dialysis: Secondary | ICD-10-CM | POA: Diagnosis not present

## 2019-06-24 DIAGNOSIS — N2581 Secondary hyperparathyroidism of renal origin: Secondary | ICD-10-CM | POA: Diagnosis not present

## 2019-06-24 DIAGNOSIS — N186 End stage renal disease: Secondary | ICD-10-CM | POA: Diagnosis not present

## 2019-06-26 DIAGNOSIS — N2581 Secondary hyperparathyroidism of renal origin: Secondary | ICD-10-CM | POA: Diagnosis not present

## 2019-06-26 DIAGNOSIS — Z992 Dependence on renal dialysis: Secondary | ICD-10-CM | POA: Diagnosis not present

## 2019-06-26 DIAGNOSIS — N186 End stage renal disease: Secondary | ICD-10-CM | POA: Diagnosis not present

## 2019-06-28 DIAGNOSIS — N186 End stage renal disease: Secondary | ICD-10-CM | POA: Diagnosis not present

## 2019-06-28 DIAGNOSIS — N2581 Secondary hyperparathyroidism of renal origin: Secondary | ICD-10-CM | POA: Diagnosis not present

## 2019-06-28 DIAGNOSIS — Z992 Dependence on renal dialysis: Secondary | ICD-10-CM | POA: Diagnosis not present

## 2019-07-03 DIAGNOSIS — N186 End stage renal disease: Secondary | ICD-10-CM | POA: Diagnosis not present

## 2019-07-03 DIAGNOSIS — Z992 Dependence on renal dialysis: Secondary | ICD-10-CM | POA: Diagnosis not present

## 2019-07-03 DIAGNOSIS — N2581 Secondary hyperparathyroidism of renal origin: Secondary | ICD-10-CM | POA: Diagnosis not present

## 2019-07-05 DIAGNOSIS — N186 End stage renal disease: Secondary | ICD-10-CM | POA: Diagnosis not present

## 2019-07-05 DIAGNOSIS — Z992 Dependence on renal dialysis: Secondary | ICD-10-CM | POA: Diagnosis not present

## 2019-07-05 DIAGNOSIS — N2581 Secondary hyperparathyroidism of renal origin: Secondary | ICD-10-CM | POA: Diagnosis not present

## 2019-07-07 ENCOUNTER — Other Ambulatory Visit: Payer: Self-pay

## 2019-07-07 ENCOUNTER — Telehealth: Payer: Self-pay | Admitting: Anesthesiology

## 2019-07-07 ENCOUNTER — Ambulatory Visit: Payer: Medicare HMO | Attending: Anesthesiology | Admitting: Anesthesiology

## 2019-07-07 DIAGNOSIS — M47816 Spondylosis without myelopathy or radiculopathy, lumbar region: Secondary | ICD-10-CM | POA: Diagnosis not present

## 2019-07-07 DIAGNOSIS — M5136 Other intervertebral disc degeneration, lumbar region: Secondary | ICD-10-CM

## 2019-07-07 DIAGNOSIS — R29898 Other symptoms and signs involving the musculoskeletal system: Secondary | ICD-10-CM | POA: Diagnosis not present

## 2019-07-07 DIAGNOSIS — G894 Chronic pain syndrome: Secondary | ICD-10-CM

## 2019-07-07 DIAGNOSIS — G8929 Other chronic pain: Secondary | ICD-10-CM

## 2019-07-07 DIAGNOSIS — G610 Guillain-Barre syndrome: Secondary | ICD-10-CM

## 2019-07-07 DIAGNOSIS — F1190 Opioid use, unspecified, uncomplicated: Secondary | ICD-10-CM

## 2019-07-07 DIAGNOSIS — Z992 Dependence on renal dialysis: Secondary | ICD-10-CM

## 2019-07-07 DIAGNOSIS — Z79891 Long term (current) use of opiate analgesic: Secondary | ICD-10-CM

## 2019-07-07 DIAGNOSIS — N186 End stage renal disease: Secondary | ICD-10-CM | POA: Diagnosis not present

## 2019-07-07 DIAGNOSIS — M545 Low back pain: Secondary | ICD-10-CM

## 2019-07-07 MED ORDER — OXYCODONE-ACETAMINOPHEN 10-325 MG PO TABS: 1.0000 | ORAL_TABLET | Freq: Three times a day (TID) | ORAL | 0 refills | Status: DC | PRN

## 2019-07-07 NOTE — Telephone Encounter (Signed)
Patient states his script should have been for 120 not 90. He is picking up meds now. He will need script for the other 30. Please call patient and let him know status.

## 2019-07-07 NOTE — Progress Notes (Signed)
Virtual Visit via Telephone Note  I connected with Patrick Downs on 07/07/19 at  1:15 PM EDT by telephone and verified that I am speaking with the correct person using two identifiers.  Location: Patient: Home Provider: Pain control center   I discussed the limitations, risks, security and privacy concerns of performing an evaluation and management service by telephone and the availability of in person appointments. I also discussed with the patient that there may be a patient responsible charge related to this service. The patient expressed understanding and agreed to proceed.   History of Present Illness: I spoke with Patrick Downs via telephone as he was unable to do the video portion of the virtual conference.  He mentions that his low back pain is markedly better with the recent increase to the 10 mg oxycodone acetaminophen tablet.  He is sleeping better at night and having less pain throughout the day which has enabled him to be more active.  He is doing his stretching exercises as tolerated yet these are still difficult for him.  He denies any side effects and feels that overall he has made good progress.  Otherwise he is in his usual state of health at this point.    Observations/Objective:  Current Outpatient Medications:  .  acetaminophen (TYLENOL) 500 MG tablet, Take 1,000-1,500 mg by mouth 2 (two) times daily as needed for moderate pain or headache., Disp: , Rfl:  .  calcium acetate (PHOSLO) 667 MG capsule, Take 3 capsules (2,001 mg total) by mouth 3 (three) times daily with meals. (Patient taking differently: Take 1,334-2,001 mg by mouth 3 (three) times daily with meals. ), Disp: 1 capsule, Rfl: 0 .  gabapentin (NEURONTIN) 300 MG capsule, Take 600 mg by mouth at bedtime. , Disp: , Rfl:  .  midodrine (PROAMATINE) 5 MG tablet, Take 5 mg by mouth See admin instructions. Take 2 tablets (10 mg) by mouth before dialysis on Tuesdays, Thursdays, & Saturday; Take 1 tablet (5 mg) by mouth in the  evening on  Tuesdays, Thursdays, & Saturdays. On non-dialysis days take 1 tablet (5 mg) by mouth every 8 hours., Disp: , Rfl:  .  oxyCODONE-acetaminophen (PERCOCET) 10-325 MG tablet, Take 1 tablet by mouth every 8 (eight) hours as needed for pain., Disp: 120 tablet, Rfl: 0 .  sildenafil (REVATIO) 20 MG tablet, Take 20-100 mg by mouth as needed (for ED). , Disp: , Rfl:  .  vancomycin IVPB, Inject into the vein. Gets three days a week at dialysis, Disp: , Rfl:   Assessment and Plan: 1. DDD (degenerative disc disease), lumbar   2. Chronic bilateral low back pain without sciatica   3. Facet arthritis of lumbar region   4. ESRD on dialysis ([place])   5. Guillain-Barre syndrome ([place])   6. Chronic pain syndrome   7. Chronic, continuous use of opioids   8. Weakness of both lower extremities   Based on our discussion today and upon review of the [facility] practitioner database information I am going to refill his medications for this next month.  I will schedule him for 1 month return.  I have encouraged him to continue with the exercises we have spoken of and continue follow-up with his primary care physicians for his baseline medical care.  Follow Up Instructions:    I discussed the assessment and treatment plan with the patient. The patient was provided an opportunity to ask questions and all were answered. The patient agreed with the plan and demonstrated an  understanding of the instructions.   The patient was advised to call back or seek an in-person evaluation if the symptoms worsen or if the condition fails to improve as anticipated.  I provided 20 minutes of non-face-to-face time during this encounter.   Molli Barrows, MD

## 2019-07-07 NOTE — Telephone Encounter (Signed)
Called patient and he is at the pharmacy now and the Pharmacist will only give him 90 tabs instead of the written amount of 120 tabs. The pharm said that his insurance would only pay for a thiry day supply based on what is written.  His script states Q8 hours instead of Q6 hours. Patient is getting 90 tabs today and will need another script for 30 tabs.

## 2019-07-08 DIAGNOSIS — N186 End stage renal disease: Secondary | ICD-10-CM | POA: Diagnosis not present

## 2019-07-08 DIAGNOSIS — Z992 Dependence on renal dialysis: Secondary | ICD-10-CM | POA: Diagnosis not present

## 2019-07-08 DIAGNOSIS — N2581 Secondary hyperparathyroidism of renal origin: Secondary | ICD-10-CM | POA: Diagnosis not present

## 2019-07-12 DIAGNOSIS — N2581 Secondary hyperparathyroidism of renal origin: Secondary | ICD-10-CM | POA: Diagnosis not present

## 2019-07-12 DIAGNOSIS — Z992 Dependence on renal dialysis: Secondary | ICD-10-CM | POA: Diagnosis not present

## 2019-07-12 DIAGNOSIS — N186 End stage renal disease: Secondary | ICD-10-CM | POA: Diagnosis not present

## 2019-07-14 ENCOUNTER — Telehealth: Payer: Self-pay | Admitting: *Deleted

## 2019-07-14 NOTE — Telephone Encounter (Signed)
Patient to call pharmacy and find out the best way for [place] to deal with this ion order to be the least $ out of pocket. He will call us back.

## 2019-07-15 DIAGNOSIS — N186 End stage renal disease: Secondary | ICD-10-CM | POA: Diagnosis not present

## 2019-07-15 DIAGNOSIS — N2581 Secondary hyperparathyroidism of renal origin: Secondary | ICD-10-CM | POA: Diagnosis not present

## 2019-07-15 DIAGNOSIS — Z992 Dependence on renal dialysis: Secondary | ICD-10-CM | POA: Diagnosis not present

## 2019-07-17 DIAGNOSIS — Z992 Dependence on renal dialysis: Secondary | ICD-10-CM | POA: Diagnosis not present

## 2019-07-17 DIAGNOSIS — N2581 Secondary hyperparathyroidism of renal origin: Secondary | ICD-10-CM | POA: Diagnosis not present

## 2019-07-17 DIAGNOSIS — N186 End stage renal disease: Secondary | ICD-10-CM | POA: Diagnosis not present

## 2019-07-17 MED ORDER — OXYCODONE-ACETAMINOPHEN 10-325 MG PO TABS: 1.0000 | ORAL_TABLET | Freq: Four times a day (QID) | ORAL | 0 refills | Status: DC | PRN

## 2019-07-17 NOTE — Addendum Note (Signed)
Addended by: Molli Barrows on: 07/17/2019 02:07 PM   Modules accepted: Orders

## 2019-07-17 NOTE — Telephone Encounter (Signed)
This was taken care of and patient was called.

## 2019-07-22 DIAGNOSIS — Z992 Dependence on renal dialysis: Secondary | ICD-10-CM | POA: Diagnosis not present

## 2019-07-22 DIAGNOSIS — N2581 Secondary hyperparathyroidism of renal origin: Secondary | ICD-10-CM | POA: Diagnosis not present

## 2019-07-22 DIAGNOSIS — N186 End stage renal disease: Secondary | ICD-10-CM | POA: Diagnosis not present

## 2019-07-24 DIAGNOSIS — N186 End stage renal disease: Secondary | ICD-10-CM | POA: Diagnosis not present

## 2019-07-24 DIAGNOSIS — Z992 Dependence on renal dialysis: Secondary | ICD-10-CM | POA: Diagnosis not present

## 2019-07-24 DIAGNOSIS — N2581 Secondary hyperparathyroidism of renal origin: Secondary | ICD-10-CM | POA: Diagnosis not present

## 2019-07-29 DIAGNOSIS — N186 End stage renal disease: Secondary | ICD-10-CM | POA: Diagnosis not present

## 2019-07-29 DIAGNOSIS — N2581 Secondary hyperparathyroidism of renal origin: Secondary | ICD-10-CM | POA: Diagnosis not present

## 2019-07-29 DIAGNOSIS — Z992 Dependence on renal dialysis: Secondary | ICD-10-CM | POA: Diagnosis not present

## 2019-07-31 DIAGNOSIS — Z992 Dependence on renal dialysis: Secondary | ICD-10-CM | POA: Diagnosis not present

## 2019-07-31 DIAGNOSIS — N2581 Secondary hyperparathyroidism of renal origin: Secondary | ICD-10-CM | POA: Diagnosis not present

## 2019-07-31 DIAGNOSIS — N186 End stage renal disease: Secondary | ICD-10-CM | POA: Diagnosis not present

## 2019-08-02 DIAGNOSIS — N186 End stage renal disease: Secondary | ICD-10-CM | POA: Diagnosis not present

## 2019-08-02 DIAGNOSIS — N2581 Secondary hyperparathyroidism of renal origin: Secondary | ICD-10-CM | POA: Diagnosis not present

## 2019-08-02 DIAGNOSIS — Z992 Dependence on renal dialysis: Secondary | ICD-10-CM | POA: Diagnosis not present

## 2019-08-04 ENCOUNTER — Other Ambulatory Visit: Payer: Self-pay

## 2019-08-04 ENCOUNTER — Ambulatory Visit: Payer: Medicare HMO | Attending: Anesthesiology | Admitting: Anesthesiology

## 2019-08-04 ENCOUNTER — Encounter: Payer: Self-pay | Admitting: Anesthesiology

## 2019-08-04 DIAGNOSIS — M47816 Spondylosis without myelopathy or radiculopathy, lumbar region: Secondary | ICD-10-CM

## 2019-08-04 DIAGNOSIS — G610 Guillain-Barre syndrome: Secondary | ICD-10-CM

## 2019-08-04 DIAGNOSIS — M545 Low back pain: Secondary | ICD-10-CM

## 2019-08-04 DIAGNOSIS — M5136 Other intervertebral disc degeneration, lumbar region: Secondary | ICD-10-CM

## 2019-08-04 DIAGNOSIS — N186 End stage renal disease: Secondary | ICD-10-CM | POA: Diagnosis not present

## 2019-08-04 DIAGNOSIS — G8929 Other chronic pain: Secondary | ICD-10-CM

## 2019-08-04 DIAGNOSIS — F1190 Opioid use, unspecified, uncomplicated: Secondary | ICD-10-CM | POA: Diagnosis not present

## 2019-08-04 DIAGNOSIS — Z992 Dependence on renal dialysis: Secondary | ICD-10-CM | POA: Diagnosis not present

## 2019-08-04 DIAGNOSIS — R29898 Other symptoms and signs involving the musculoskeletal system: Secondary | ICD-10-CM | POA: Diagnosis not present

## 2019-08-04 DIAGNOSIS — G894 Chronic pain syndrome: Secondary | ICD-10-CM

## 2019-08-04 MED ORDER — OXYCODONE-ACETAMINOPHEN 10-325 MG PO TABS: 1.0000 | ORAL_TABLET | Freq: Four times a day (QID) | ORAL | 0 refills | Status: DC | PRN

## 2019-08-04 MED ORDER — OXYCODONE-ACETAMINOPHEN 10-325 MG PO TABS: 1.0000 | ORAL_TABLET | Freq: Four times a day (QID) | ORAL | 0 refills | Status: AC | PRN

## 2019-08-04 NOTE — Progress Notes (Signed)
Virtual Visit via Telephone Note  I connected with Patrick Downs on 08/04/19 at 12:15 PM EDT by telephone and verified that I am speaking with the correct person using two identifiers.  Location: Patient: Home Provider: Pain control center   I discussed the limitations, risks, security and privacy concerns of performing an evaluation and management service by telephone and the availability of in person appointments. I also discussed with the patient that there may be a patient responsible charge related to this service. The patient expressed understanding and agreed to proceed.   History of Present Illness: I spoke with Patrick Downs via telephone as he was unable to do the video portion of the virtual conference.  He reports that he has been doing well with his low back pain and has done much better with generalized pain control now on the 10 mg tablets taking these 4 times a day.  He takes them as prescribed and shows no evidence of any diverting or illicit use and reports getting good relief.  Unfortunately he has been unable to maintain good relief with more conservative therapy and has been on chronic opioid therapy for a considerable period of time.  Based on our discussion today he is deriving good functional lifestyle improvement no side effects are reported.  Otherwise the quality characteristic and distribution of pain to been stable in nature with no changes reported recently.  His bowel and bladder function been stable as well and he does have history of end-stage renal disease.   Observations/Objective:  Current Outpatient Medications:  .  acetaminophen (TYLENOL) 500 MG tablet, Take 1,000-1,500 mg by mouth 2 (two) times daily as needed for moderate pain or headache., Disp: , Rfl:  .  calcium acetate (PHOSLO) 667 MG capsule, Take 3 capsules (2,001 mg total) by mouth 3 (three) times daily with meals. (Patient taking differently: Take 1,334-2,001 mg by mouth 3 (three) times daily with meals.  ), Disp: 1 capsule, Rfl: 0 .  gabapentin (NEURONTIN) 300 MG capsule, Take 600 mg by mouth at bedtime. , Disp: , Rfl:  .  midodrine (PROAMATINE) 5 MG tablet, Take 5 mg by mouth See admin instructions. Take 2 tablets (10 mg) by mouth before dialysis on Tuesdays, Thursdays, & Saturday; Take 1 tablet (5 mg) by mouth in the evening on  Tuesdays, Thursdays, & Saturdays. On non-dialysis days take 1 tablet (5 mg) by mouth every 8 hours., Disp: , Rfl:  .  [START ON 08/28/2019] oxyCODONE-acetaminophen (PERCOCET) 10-325 MG tablet, Take 1 tablet by mouth every 6 (six) hours as needed for pain., Disp: 120 tablet, Rfl: 0 .  [START ON 09/27/2019] oxyCODONE-acetaminophen (PERCOCET) 10-325 MG tablet, Take 1 tablet by mouth every 6 (six) hours as needed for pain., Disp: 120 tablet, Rfl: 0 .  sildenafil (REVATIO) 20 MG tablet, Take 20-100 mg by mouth as needed (for ED). , Disp: , Rfl:  .  vancomycin IVPB, Inject into the vein. Gets three days a week at dialysis, Disp: , Rfl:   Assessment and Plan:  1. DDD (degenerative disc disease), lumbar   2. Chronic bilateral low back pain without sciatica   3. Facet arthritis of lumbar region   4. ESRD on dialysis ([place])   5. Guillain-Barre syndrome ([place])   6. Chronic pain syndrome   7. Chronic, continuous use of opioids   8. Weakness of both lower extremities   He seems to be doing well on this current regimen and I will refill today for May 6 and June  5.  I have reviewed the [facility] practitioner database information and it is appropriate.  I will keep him on his current stretching strengthening exercises as previously reviewed and he is scheduled for a serum drug screen here in the near future.  He is to continue follow-up with his primary care physicians for his baseline medical care and he has been instructed to contact us should he have any problem with his current pain management regimen. Follow Up Instructions:    I discussed the assessment and treatment plan with  the patient. The patient was provided an opportunity to ask questions and all were answered. The patient agreed with the plan and demonstrated an understanding of the instructions.   The patient was advised to call back or seek an in-person evaluation if the symptoms worsen or if the condition fails to improve as anticipated.  I provided 25 minutes of non-face-to-face time during this encounter.   Molli Barrows, MD

## 2019-08-05 DIAGNOSIS — N2581 Secondary hyperparathyroidism of renal origin: Secondary | ICD-10-CM | POA: Diagnosis not present

## 2019-08-05 DIAGNOSIS — N186 End stage renal disease: Secondary | ICD-10-CM | POA: Diagnosis not present

## 2019-08-05 DIAGNOSIS — Z992 Dependence on renal dialysis: Secondary | ICD-10-CM | POA: Diagnosis not present

## 2019-08-07 DIAGNOSIS — N2581 Secondary hyperparathyroidism of renal origin: Secondary | ICD-10-CM | POA: Diagnosis not present

## 2019-08-07 DIAGNOSIS — Z992 Dependence on renal dialysis: Secondary | ICD-10-CM | POA: Diagnosis not present

## 2019-08-07 DIAGNOSIS — N186 End stage renal disease: Secondary | ICD-10-CM | POA: Diagnosis not present

## 2019-08-08 ENCOUNTER — Inpatient Hospital Stay
Admission: EM | Admit: 2019-08-08 | Discharge: 2019-08-10 | DRG: 871 | Disposition: A | Discharge status: Home / Self Care | Payer: Medicare HMO | Attending: Internal Medicine | Admitting: Internal Medicine

## 2019-08-08 ENCOUNTER — Other Ambulatory Visit: Payer: Self-pay

## 2019-08-08 ENCOUNTER — Emergency Department: Payer: Medicare HMO

## 2019-08-08 ENCOUNTER — Encounter: Payer: Self-pay | Admitting: Emergency Medicine

## 2019-08-08 DIAGNOSIS — A419 Sepsis, unspecified organism: Secondary | ICD-10-CM | POA: Diagnosis present

## 2019-08-08 DIAGNOSIS — B9789 Other viral agents as the cause of diseases classified elsewhere: Secondary | ICD-10-CM | POA: Diagnosis present

## 2019-08-08 DIAGNOSIS — A4189 Other specified sepsis: Principal | ICD-10-CM | POA: Diagnosis present

## 2019-08-08 DIAGNOSIS — M5136 Other intervertebral disc degeneration, lumbar region: Secondary | ICD-10-CM | POA: Diagnosis not present

## 2019-08-08 DIAGNOSIS — I120 Hypertensive chronic kidney disease with stage 5 chronic kidney disease or end stage renal disease: Secondary | ICD-10-CM | POA: Diagnosis present

## 2019-08-08 DIAGNOSIS — J189 Pneumonia, unspecified organism: Secondary | ICD-10-CM | POA: Diagnosis not present

## 2019-08-08 DIAGNOSIS — Z20822 Contact with and (suspected) exposure to covid-19: Secondary | ICD-10-CM | POA: Diagnosis not present

## 2019-08-08 DIAGNOSIS — I9589 Other hypotension: Secondary | ICD-10-CM | POA: Diagnosis present

## 2019-08-08 DIAGNOSIS — D631 Anemia in chronic kidney disease: Secondary | ICD-10-CM | POA: Diagnosis present

## 2019-08-08 DIAGNOSIS — G650 Sequelae of Guillain-Barre syndrome: Secondary | ICD-10-CM | POA: Diagnosis not present

## 2019-08-08 DIAGNOSIS — R6520 Severe sepsis without septic shock: Secondary | ICD-10-CM | POA: Diagnosis present

## 2019-08-08 DIAGNOSIS — G6289 Other specified polyneuropathies: Secondary | ICD-10-CM | POA: Diagnosis present

## 2019-08-08 DIAGNOSIS — M51369 Other intervertebral disc degeneration, lumbar region without mention of lumbar back pain or lower extremity pain: Secondary | ICD-10-CM | POA: Diagnosis present

## 2019-08-08 DIAGNOSIS — Z833 Family history of diabetes mellitus: Secondary | ICD-10-CM

## 2019-08-08 DIAGNOSIS — I953 Hypotension of hemodialysis: Secondary | ICD-10-CM

## 2019-08-08 DIAGNOSIS — N186 End stage renal disease: Secondary | ICD-10-CM

## 2019-08-08 DIAGNOSIS — N2581 Secondary hyperparathyroidism of renal origin: Secondary | ICD-10-CM | POA: Diagnosis not present

## 2019-08-08 DIAGNOSIS — G629 Polyneuropathy, unspecified: Secondary | ICD-10-CM

## 2019-08-08 DIAGNOSIS — G8929 Other chronic pain: Secondary | ICD-10-CM | POA: Diagnosis present

## 2019-08-08 DIAGNOSIS — R509 Fever, unspecified: Secondary | ICD-10-CM | POA: Diagnosis not present

## 2019-08-08 DIAGNOSIS — E1122 Type 2 diabetes mellitus with diabetic chronic kidney disease: Secondary | ICD-10-CM | POA: Diagnosis not present

## 2019-08-08 DIAGNOSIS — G610 Guillain-Barre syndrome: Secondary | ICD-10-CM | POA: Diagnosis not present

## 2019-08-08 DIAGNOSIS — E1140 Type 2 diabetes mellitus with diabetic neuropathy, unspecified: Secondary | ICD-10-CM | POA: Diagnosis present

## 2019-08-08 DIAGNOSIS — Z992 Dependence on renal dialysis: Secondary | ICD-10-CM | POA: Diagnosis not present

## 2019-08-08 DIAGNOSIS — E1142 Type 2 diabetes mellitus with diabetic polyneuropathy: Secondary | ICD-10-CM | POA: Diagnosis present

## 2019-08-08 DIAGNOSIS — E875 Hyperkalemia: Secondary | ICD-10-CM

## 2019-08-08 DIAGNOSIS — R52 Pain, unspecified: Secondary | ICD-10-CM | POA: Diagnosis not present

## 2019-08-08 DIAGNOSIS — R000 Tachycardia, unspecified: Secondary | ICD-10-CM | POA: Diagnosis not present

## 2019-08-08 DIAGNOSIS — Z841 Family history of disorders of kidney and ureter: Secondary | ICD-10-CM

## 2019-08-08 DIAGNOSIS — I959 Hypotension, unspecified: Secondary | ICD-10-CM | POA: Diagnosis not present

## 2019-08-08 LAB — CBC WITH DIFFERENTIAL/PLATELET
Abs Immature Granulocytes: 0.03 10*3/uL (ref 0.00–0.07)
Basophils Absolute: 0 10*3/uL (ref 0.0–0.1)
Basophils Relative: 0 %
Eosinophils Absolute: 0 10*3/uL (ref 0.0–0.5)
Eosinophils Relative: 0 %
HCT: 37 % — ABNORMAL LOW (ref 39.0–52.0)
Hemoglobin: 12 g/dL — ABNORMAL LOW (ref 13.0–17.0)
Immature Granulocytes: 0 %
Lymphocytes Relative: 5 %
Lymphs Abs: 0.5 10*3/uL — ABNORMAL LOW (ref 0.7–4.0)
MCH: 29.3 pg (ref 26.0–34.0)
MCHC: 32.4 g/dL (ref 30.0–36.0)
MCV: 90.5 fL (ref 80.0–100.0)
Monocytes Absolute: 0.8 10*3/uL (ref 0.1–1.0)
Monocytes Relative: 8 %
Neutro Abs: 8.6 10*3/uL — ABNORMAL HIGH (ref 1.7–7.7)
Neutrophils Relative %: 87 %
Platelets: 143 10*3/uL — ABNORMAL LOW (ref 150–400)
RBC: 4.09 MIL/uL — ABNORMAL LOW (ref 4.22–5.81)
RDW: 16.1 % — ABNORMAL HIGH (ref 11.5–15.5)
WBC: 9.8 10*3/uL (ref 4.0–10.5)
nRBC: 0 % (ref 0.0–0.2)

## 2019-08-08 LAB — TSH: TSH: 2.932 u[IU]/mL (ref 0.350–4.500)

## 2019-08-08 LAB — APTT: aPTT: 39 seconds — ABNORMAL HIGH (ref 24–36)

## 2019-08-08 LAB — COMPREHENSIVE METABOLIC PANEL
ALT: 21 U/L (ref 0–44)
AST: 21 U/L (ref 15–41)
Albumin: 4.2 g/dL (ref 3.5–5.0)
Alkaline Phosphatase: 63 U/L (ref 38–126)
Anion gap: 16 — ABNORMAL HIGH (ref 5–15)
BUN: 54 mg/dL — ABNORMAL HIGH (ref 6–20)
CO2: 21 mmol/L — ABNORMAL LOW (ref 22–32)
Calcium: 8.7 mg/dL — ABNORMAL LOW (ref 8.9–10.3)
Chloride: 100 mmol/L (ref 98–111)
Creatinine, Ser: 12.07 mg/dL — ABNORMAL HIGH (ref 0.61–1.24)
GFR calc Af Amer: 5 mL/min — ABNORMAL LOW (ref >60)
GFR calc non Af Amer: 5 mL/min — ABNORMAL LOW (ref >60)
Glucose, Bld: 113 mg/dL — ABNORMAL HIGH (ref 70–99)
Potassium: 5.5 mmol/L — ABNORMAL HIGH (ref 3.5–5.1)
Sodium: 137 mmol/L (ref 135–145)
Total Bilirubin: 1 mg/dL (ref 0.3–1.2)
Total Protein: 8 g/dL (ref 6.5–8.1)

## 2019-08-08 LAB — LACTIC ACID, PLASMA
Lactic Acid, Venous: 2 mmol/L (ref 0.5–1.9)
Lactic Acid, Venous: 2.5 mmol/L (ref 0.5–1.9)

## 2019-08-08 LAB — PROTIME-INR
INR: 1.1 (ref 0.8–1.2)
Prothrombin Time: 13.6 seconds (ref 11.4–15.2)

## 2019-08-08 LAB — RESPIRATORY PANEL BY RT PCR (FLU A&B, COVID)
Influenza A by PCR: NEGATIVE
Influenza B by PCR: NEGATIVE
SARS Coronavirus 2 by RT PCR: NEGATIVE

## 2019-08-08 LAB — HIV ANTIBODY (ROUTINE TESTING W REFLEX): HIV Screen 4th Generation wRfx: NONREACTIVE

## 2019-08-08 LAB — POC SARS CORONAVIRUS 2 AG -  ED: SARS Coronavirus 2 Ag: NEGATIVE

## 2019-08-08 MED ORDER — VANCOMYCIN HCL IN DEXTROSE 1-5 GM/200ML-% IV SOLN: 1000.0000 mg | Freq: Once | INTRAVENOUS | Status: DC

## 2019-08-08 MED ORDER — LORAZEPAM 2 MG/ML IJ SOLN
1.0000 mg | Freq: Once | INTRAMUSCULAR | Status: AC
  Administered 2019-08-08: 1 mg via INTRAVENOUS
  Filled 2019-08-08: qty 1

## 2019-08-08 MED ORDER — LACTATED RINGERS IV BOLUS (SEPSIS): 1000.0000 mL | Freq: Once | INTRAVENOUS | Status: DC

## 2019-08-08 MED ORDER — METRONIDAZOLE IN NACL 5-0.79 MG/ML-% IV SOLN
500.0000 mg | Freq: Once | INTRAVENOUS | Status: AC
  Administered 2019-08-08: 500 mg via INTRAVENOUS
  Filled 2019-08-08 (×2): qty 100

## 2019-08-08 MED ORDER — MIDODRINE HCL 5 MG PO TABS
5.0000 mg | ORAL_TABLET | Freq: Three times a day (TID) | ORAL | Status: DC
  Administered 2019-08-08 – 2019-08-10 (×4): 5 mg via ORAL
  Filled 2019-08-08 (×6): qty 1

## 2019-08-08 MED ORDER — VANCOMYCIN HCL IN DEXTROSE 1-5 GM/200ML-% IV SOLN
1000.0000 mg | INTRAVENOUS | Status: AC
  Administered 2019-08-08: 1000 mg via INTRAVENOUS
  Filled 2019-08-08: qty 200

## 2019-08-08 MED ORDER — SODIUM CHLORIDE 0.9 % IV SOLN
2.0000 g | Freq: Once | INTRAVENOUS | Status: AC
  Administered 2019-08-08: 2 g via INTRAVENOUS
  Filled 2019-08-08: qty 2

## 2019-08-08 MED ORDER — SODIUM CHLORIDE 0.9 % IV SOLN
1.0000 g | INTRAVENOUS | Status: DC
  Administered 2019-08-09: 1 g via INTRAVENOUS
  Filled 2019-08-08 (×2): qty 1

## 2019-08-08 MED ORDER — CHLORHEXIDINE GLUCONATE CLOTH 2 % EX PADS
6.0000 | MEDICATED_PAD | Freq: Every day | CUTANEOUS | Status: DC
  Administered 2019-08-09 – 2019-08-10 (×2): 6 via TOPICAL
  Filled 2019-08-08: qty 6

## 2019-08-08 MED ORDER — SODIUM CHLORIDE 0.9 % IV SOLN: 2.0000 g | Freq: Once | INTRAVENOUS | Status: DC

## 2019-08-08 MED ORDER — GABAPENTIN 300 MG PO CAPS
600.0000 mg | ORAL_CAPSULE | Freq: Every day | ORAL | Status: DC
  Administered 2019-08-09 (×2): 600 mg via ORAL
  Filled 2019-08-08 (×2): qty 2

## 2019-08-08 MED ORDER — VANCOMYCIN HCL IN DEXTROSE 1-5 GM/200ML-% IV SOLN
1000.0000 mg | Freq: Once | INTRAVENOUS | Status: AC
  Administered 2019-08-08: 1000 mg via INTRAVENOUS
  Filled 2019-08-08: qty 200

## 2019-08-08 MED ORDER — LACTATED RINGERS IV BOLUS (SEPSIS)
1000.0000 mL | Freq: Once | INTRAVENOUS | Status: AC
  Administered 2019-08-08: 1000 mL via INTRAVENOUS

## 2019-08-08 MED ORDER — SODIUM ZIRCONIUM CYCLOSILICATE 10 G PO PACK
10.0000 g | PACK | Freq: Every day | ORAL | Status: DC
  Administered 2019-08-08 – 2019-08-10 (×3): 10 g via ORAL
  Filled 2019-08-08 (×4): qty 1

## 2019-08-08 MED ORDER — METRONIDAZOLE IN NACL 5-0.79 MG/ML-% IV SOLN
500.0000 mg | Freq: Three times a day (TID) | INTRAVENOUS | Status: DC
  Administered 2019-08-08 – 2019-08-10 (×6): 500 mg via INTRAVENOUS
  Filled 2019-08-08 (×8): qty 100

## 2019-08-08 MED ORDER — HEPARIN SODIUM (PORCINE) 5000 UNIT/ML IJ SOLN
5000.0000 [IU] | Freq: Three times a day (TID) | INTRAMUSCULAR | Status: DC
  Administered 2019-08-09 – 2019-08-10 (×3): 5000 [IU] via SUBCUTANEOUS
  Filled 2019-08-08 (×4): qty 1

## 2019-08-08 MED ORDER — VANCOMYCIN HCL IN DEXTROSE 1-5 GM/200ML-% IV SOLN
1000.0000 mg | INTRAVENOUS | Status: DC
  Administered 2019-08-09: 1000 mg via INTRAVENOUS
  Filled 2019-08-08 (×2): qty 200

## 2019-08-08 NOTE — Progress Notes (Signed)
PHARMACY -  BRIEF ANTIBIOTIC NOTE   Pharmacy has received consult(s) for Vancomycin and Cefepime from an ED provider.  The patient's profile has been reviewed for ht/wt/allergies/indication/available labs.    One time order(s) placed by MD for Cefepime 2 gm and Vancomycin 1 gram   Further antibiotics/pharmacy consults should be ordered by admitting physician if indicated.                       Thank you, Maryalyce Sanjuan A 08/08/2019  10:50 AM

## 2019-08-08 NOTE — Progress Notes (Signed)
VAST consulted to obtain IV access. Upon entering room, pt with 2 PIV's in place, one saline locked and one infusing fluids via gravity. Spoke with pt's nurse, Jacqulynn Cadet; he stated neither IV giving blood return and he was worried proximal IV might be infiltrated. Pt's RN reported proximal PIV with swelling and skin cool to touch. He stopped fluids and switched them over to distal PIV; distal PIV infusing without swelling, redness, or difficulty. Proximal PIV without swelling noted by VAST RN. PIV flushed with 20 mLs NS; able to feel infusion of flush through IV without swelling or firmness noted. No BR, but flushed easily.  Jacqulynn Cadet, RN stated BP was previously being taken in that left arm when swelling occurred. BP has since been checked in pt's right leg. Educated BP should be avoided in extremity with continuous infusing fluids.

## 2019-08-08 NOTE — ED Triage Notes (Signed)
Pt reports is recovering from [place] syndrome and has neuropathy in his feet and takes gabapentin but this am the pain all the sudden got worse. Pt moving around in [facility] and kicking feet, unable to sit still, reports it is helping. Pt reports the pain is a burning pain with a shock feeling through his nerves.

## 2019-08-08 NOTE — Progress Notes (Signed)
[place], [place] 08/08/19  Subjective:   Hospital day # 0  Patient known to our practice from previous admissions Presents for worsening of neuropathy in the feet Very sleepy this afternoon.  Did not wake up to answer any questions Most of the information is obtained from the chart Per report, patient completed his dialysis treatment yesterday Renal: No intake/output data recorded. Lab Results  Component Value Date   CREATININE 12.07 (H) 08/08/2019   CREATININE 13.96 (H) 02/06/2018   CREATININE 11.39 (H) 01/20/2018     Objective:  Vital signs in last 24 hours:  Temp:  [103.1 F (39.5 C)] 103.1 F (39.5 C) (04/16 0928) Pulse Rate:  [113-127] 113 (04/16 1100) Resp:  [20-29] 23 (04/16 1100) BP: (98-130)/(55-113) 98/64 (04/16 1100) SpO2:  [96 %-100 %] 99 % (04/16 1100) Weight:  [117.9 kg] 117.9 kg (04/16 0925)  Weight change:  Filed Weights   08/08/19 0925  Weight: 117.9 kg    Intake/Output:    Intake/Output Summary (Last 24 hours) at 08/08/2019 1348 Last data filed at 08/08/2019 1248 Gross per 24 hour  Intake 300 ml  Output -  Net 300 ml     Physical Exam: General:  No acute distress, laying in the bed  HEENT  moist oral mucous membrane  Pulm/lungs  snoring, sleep apnea pattern of breathing, no crackles  CVS/Heart  no rub  Abdomen:   Soft, nontender  Extremities:  No peripheral edema  Neurologic:  Very sleepy, arousable but did not answer any questions  Skin:  Dry skin  Access:  Right IJ PermCath       Basic Metabolic Panel:  Recent Labs  Lab 08/08/19 0950  NA 137  K 5.5*  CL 100  CO2 21*  GLUCOSE 113*  BUN 54*  CREATININE 12.07*  CALCIUM 8.7*     CBC: Recent Labs  Lab 08/08/19 0950  WBC 9.8  NEUTROABS 8.6*  HGB 12.0*  HCT 37.0*  MCV 90.5  PLT 143*      Lab Results  Component Value Date   HEPBSAG Negative 11/27/2014   HEPBSAB Reactive 09/28/2014   HEPBIGM NON REACTIVE 08/29/2014       Microbiology:  No results found for this or any previous visit (from the past 240 hour(s)).  Coagulation Studies: Recent Labs    08/08/19 0950  LABPROT 13.6  INR 1.1    Urinalysis: No results for input(s): COLORURINE, LABSPEC, PHURINE, GLUCOSEU, HGBUR, BILIRUBINUR, KETONESUR, PROTEINUR, UROBILINOGEN, NITRITE, LEUKOCYTESUR in the last 72 hours.  Invalid input(s): APPERANCEUR    Imaging: DG Chest Port 1 View  Result Date: 08/08/2019 CLINICAL DATA:  Fever and neuropathy. EXAM: PORTABLE CHEST 1 VIEW COMPARISON:  01/19/2018 FINDINGS: Right-sided dual lumen catheter terminates in the upper right atrium. Cardiomediastinal contours and hilar structures are normal. Central airways grossly unremarkable. Pulmonary vascular engorgement. No signs of frank pulmonary edema. Subtle nodular pattern is suggested in the chest with miliary distribution and appearance. Visualized skeletal structures are unremarkable. Evidence of prior left axillary stenting is similar to previous exams. IMPRESSION: 1. Question small nodular pattern in the chest. Correlate with any signs of infection. Chest CT may be helpful for further evaluation. 2. Pulmonary vascular engorgement without frank pulmonary edema. Electronically Signed   By: Zetta Bills M.D.   On: 08/08/2019 10:17     Medications:   . ceFEPime (MAXIPIME) IV    . lactated ringers 1,000 mL (08/08/19 1322)   And  . lactated ringers  And  . lactated ringers     And  . lactated ringers    . metronidazole 500 mg (08/08/19 1302)  . metronidazole    . vancomycin     . gabapentin  600 mg Oral QHS  . heparin  5,000 Units Subcutaneous Q8H     Assessment/ Plan:  44 y.o. male with  end-stage renal disease Hypertension History of Guillain-Barr syndrome  admitted on 08/08/2019 for Sepsis ([place]) [A41.9]  [facility]/TTS  #End-stage renal disease We will arrange for hemodialysis on his routine schedule of Tuesday  Thursday and Saturday  #Hyperkalemia Expected to correct with hemodialysis [place] today  #Anemia of chronic kidney disease  Lab Results  Component Value Date   HGB 12.0 (L) 08/08/2019   #Secondary hyperparathyroidism Lab Results  Component Value Date   PTH 85 (H) 09/23/2014   CALCIUM 8.7 (L) 08/08/2019   PHOS 8.2 (H) 08/17/2017  Continue home dose of PhosLo with meals  #Chronic hypotension Patient takes midodrine prior to dialysis as outpatient Treated as code sepsis with broad spectrum antibiotics      LOS: 0 Kerry Chisolm Candiss Norse 4/16/20211:48 PM  [place], [place]  Note: This note was prepared with Dragon dictation. Any transcription errors are unintentional

## 2019-08-08 NOTE — H&P (Signed)
[facility] at [place] NAME: Patrick Downs    MR#:  [id number]  DATE OF BIRTH:  Apr 08, 1976  DATE OF ADMISSION:  08/08/2019  PRIMARY CARE PHYSICIAN: Baxter Hire, MD   REQUESTING/REFERRING PHYSICIAN: Lavonia Drafts MD  Comes from [place]:   Chief Complaint  Patient presents with  . Foot Burn  . Peripheral Neuropathy    HISTORY OF PRESENT ILLNESS:  Patrick Downs  is a 44 y.o. 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  He does wear leg braces.  Recently over the last 24 hours neuropathic pain in his feet have become much worse and he has had difficulty keeping his feet still because of the pain. He received dialysis yesterday as scheduled but less fluid taken off than typical per patient.  No cough, shortness of breath.  Has not had Covid vaccination because vaccine is what caused Guyon Barr syndrome. Upon my evaluation, patient sleeping for the most part and when wakes up, has jerking of his both extremities - within few seconds falls back sleep.  While in ED he had fever (Tmax 103.1), tachycardia (127) and hypotension (80/50), elevated lactate 2.5 and is being admitted for sepsis of unknown etio. (no clear source). COVID neg PAST MEDICAL HISTORY:   Past Medical History:  Diagnosis Date  . Depression   . Dialysis patient ([place])   . ESRD (end stage renal disease) on dialysis ([place])   . Failure to thrive in adult   . Gout   . Guillain Barr syndrome ([place])   . HTN (hypertension)   . Kidney failure   . Pneumonia   . Renal insufficiency   . Respiratory failure ([place])    PAST SURGICAL HISTORY:   Past Surgical History:  Procedure Laterality Date  . A/V FISTULAGRAM Right 01/07/2018   Procedure: A/V FISTULAGRAM;  Surgeon: Algernon Huxley, MD;  Location: [facility];  Service: Cardiovascular;  Laterality: Right;  .  AMPUTATION TOE Right 06/08/2017   Procedure: AMPUTATION TOE RIGHT FIFTH TOE;  Surgeon: Samara Deist, DPM;  Location: [facility];  Service: Podiatry;  Laterality: Right;  . AMPUTATION TOE Left 05/17/2018   Procedure: RAY LEFT 5TH;  Surgeon: Samara Deist, DPM;  Location: [facility];  Service: Podiatry;  Laterality: Left;  . AV FISTULA PLACEMENT     x5      2 graphs  . DIALYSIS/PERMA CATHETER INSERTION N/A 01/20/2019   Procedure: DIALYSIS/PERMA CATHETER INSERTION;  Surgeon: Algernon Huxley, MD;  Location: [facility];  Service: Cardiovascular;  Laterality: N/A;  . DIALYSIS/PERMA CATHETER REMOVAL N/A 06/17/2018   Procedure: DIALYSIS/PERMA CATHETER REMOVAL;  Surgeon: Algernon Huxley, MD;  Location: [facility];  Service: Cardiovascular;  Laterality: N/A;  . PARATHYROIDECTOMY    . PERIPHERAL VASCULAR THROMBECTOMY Right 01/22/2019   Procedure: PERIPHERAL VASCULAR THROMBECTOMY;  Surgeon: Algernon Huxley, MD;  Location: [facility];  Service: Cardiovascular;  Laterality: Right;  . RENAL BIOPSY    . REVISON OF ARTERIOVENOUS FISTULA Right 02/07/2018   Procedure: REVISON OF ARTERIOVENOUS FISTULA;  Surgeon: Algernon Huxley, MD;  Location: [facility];  Service: Vascular;  Laterality: Right;  . TEE WITHOUT CARDIOVERSION N/A 01/22/2018   Procedure: TRANSESOPHAGEAL ECHOCARDIOGRAM (TEE);  Surgeon: Dionisio David, MD;  Location: [facility];  Service: Cardiovascular;  Laterality: N/A;  . tonsiilectomy    . tracheotomy  SOCIAL HISTORY:   Social History   Tobacco Use  . Smoking status: Never Smoker  . Smokeless tobacco: Never Used  Substance Use Topics  . Alcohol use: No   FAMILY HISTORY:   Family History  Problem Relation Age of Onset  . Diabetes Mellitus II Father   . Kidney disease Father   . Kidney failure Paternal Grandfather   . Prostate cancer Neg Hx   . Kidney cancer Neg Hx   . Bladder Cancer Neg Hx    DRUG ALLERGIES:   Allergies  Allergen Reactions  . Ondansetron Other (See  Comments)    Stomach pain   . Minoxidil Other (See Comments)    "put fluid around my heart", PERICARDIAL EFFUSION  . Morphine And Related Other (See Comments)    Aggressive   . Omnipaque [Iohexol] Itching and Other (See Comments)    Rigors on one occasion, widespread itching on a separate occasion (resolved with Benadryl), tremors   REVIEW OF SYSTEMS:  Review of Systems  Unable to perform ROS: Mental acuity   MEDICATIONS AT HOME:   Prior to Admission medications   Medication Sig Start Date End Date Taking? Authorizing Provider  acetaminophen (TYLENOL) 500 MG tablet Take 1,000-1,500 mg by mouth 2 (two) times daily as needed for moderate pain or headache.    [provider]  calcium acetate (PHOSLO) 667 MG capsule Take 3 capsules (2,001 mg total) by mouth 3 (three) times daily with meals. Patient taking differently: Take 1,334-2,001 mg by mouth 3 (three) times daily with meals.  08/26/14   [name], MD  gabapentin (NEURONTIN) 300 MG capsule Take 600 mg by mouth at bedtime.     [provider]  midodrine (PROAMATINE) 5 MG tablet Take 5 mg by mouth See admin instructions. Take 2 tablets (10 mg) by mouth before dialysis on Tuesdays, Thursdays, & Saturday; Take 1 tablet (5 mg) by mouth in the evening on  Tuesdays, Thursdays, & Saturdays. On non-dialysis days take 1 tablet (5 mg) by mouth every 8 hours.    [provider]  oxyCODONE-acetaminophen (PERCOCET) 10-325 MG tablet Take 1 tablet by mouth every 6 (six) hours as needed for pain. 08/28/19 09/27/19  Molli Barrows, MD  oxyCODONE-acetaminophen (PERCOCET) 10-325 MG tablet Take 1 tablet by mouth every 6 (six) hours as needed for pain. 09/27/19 10/27/19  Molli Barrows, MD  sildenafil (REVATIO) 20 MG tablet Take 20-100 mg by mouth as needed (for ED).  03/02/17   [provider]  vancomycin IVPB Inject into the vein. Gets three days a week at dialysis    [provider]    VITAL SIGNS:  Blood pressure  98/64, pulse (!) 113, temperature (!) 103.1 F (39.5 C), temperature source Oral, resp. rate (!) 23, height 6\' 2"  (1.88 m), weight 117.9 kg, SpO2 99 %. PHYSICAL EXAMINATION:  Physical Exam  GENERAL:  44 y.o.-year-old patient lying in the bed with no acute distress.  EYES: Pupils equal, round, reactive to light and accommodation. No scleral icterus. Extraocular muscles intact.  HEENT: Head atraumatic, normocephalic. Oropharynx and nasopharynx clear.  NECK:  Supple, no jugular venous distention. No thyroid enlargement, no tenderness.  LUNGS: Normal breath sounds bilaterally, no wheezing, rales,rhonchi or crepitation. No use of accessory muscles of respiration.  CARDIOVASCULAR: S1, S2 normal. No murmurs, rubs, or gallops.  ABDOMEN: Soft, nontender, nondistended. Bowel sounds present. No organomegaly or mass.  EXTREMITIES: No pedal edema, cyanosis, or clubbing.  NEUROLOGIC: Cranial nerves II through XII are intact.  Muscle strength 5/5 in all extremities. Sensation intact. Gait not checked.  PSYCHIATRIC: The patient is sleepy SKIN: No obvious rash, lesion, or ulcer. Access: Right IJ PermCath, has fistula in both arms but nonfunctional per nursing  LABORATORY PANEL:   CBC Recent Labs  Lab 08/08/19 0950  WBC 9.8  HGB 12.0*  HCT 37.0*  PLT 143*   ------------------------------------------------------------------------------------------------------------------  Chemistries  Recent Labs  Lab 08/08/19 0950  NA 137  K 5.5*  CL 100  CO2 21*  GLUCOSE 113*  BUN 54*  CREATININE 12.07*  CALCIUM 8.7*  AST 21  ALT 21  ALKPHOS 63  BILITOT 1.0   ------------------------------------------------------------------------------------------------------------------  Cardiac Enzymes No results for input(s): TROPONINI in the last 168 hours. ------------------------------------------------------------------------------------------------------------------  RADIOLOGY:  DG Chest Port 1  View  Result Date: 08/08/2019 CLINICAL DATA:  Fever and neuropathy. EXAM: PORTABLE CHEST 1 VIEW COMPARISON:  01/19/2018 FINDINGS: Right-sided dual lumen catheter terminates in the upper right atrium. Cardiomediastinal contours and hilar structures are normal. Central airways grossly unremarkable. Pulmonary vascular engorgement. No signs of frank pulmonary edema. Subtle nodular pattern is suggested in the chest with miliary distribution and appearance. Visualized skeletal structures are unremarkable. Evidence of prior left axillary stenting is similar to previous exams. IMPRESSION: 1. Question small nodular pattern in the chest. Correlate with any signs of infection. Chest CT may be helpful for further evaluation. 2. Pulmonary vascular engorgement without frank pulmonary edema. Electronically Signed   By: Zetta Bills M.D.   On: 08/08/2019 10:17   IMPRESSION AND PLAN:  44 year old male with ESRD on hemodialysis admitted for sepsis.  * Sepsis: present on admission, fever (Tmax 103.1), tachycardia (127) and hypotension (80/50), elevated lactate 2.5  - start sepsis protocol, empiric Abx for now as clear source of infection. Await blood c/s, COVID neg   * ESRD on hemodialysis.  - Nephro Dr. Murlean Iba aware and will get HD per his schedule (T-T-S) tomorrow.   * Hyperkalemia - should correct with HD  * chronic hypotension:  -  midodrine before HD  * Neuropathy - gapapentin  All the records are reviewed and case discussed with ED provider. Management plans discussed with the patient, nursing and they are in agreement.  CODE STATUS: FULL CODE  TOTAL TIME TAKING CARE OF THIS PATIENT: 35 minutes.    Max Sane M.D on 08/08/2019 at 12:59 PM  [facility]   CC: Primary care physician; Baxter Hire, MD   Note: This dictation was prepared with Dragon dictation along with smaller phrase technology. Any transcriptional errors that result from this process are unintentional.

## 2019-08-08 NOTE — ED Triage Notes (Signed)
Pt in via EMS from home with c/o burning and pain in both feet and ankles. Pt with hx of neuropathy. Pt is a dialysis patient and has his treatment yesterday.  94/57, 130HR, 98%, 103.0 oral temp

## 2019-08-08 NOTE — ED Notes (Signed)
Attempted to draw Lactic acid w/o success. Lab called to collect.

## 2019-08-08 NOTE — ED Notes (Signed)
Pt ate 100% of dinner tray

## 2019-08-08 NOTE — ED Notes (Signed)
Pt provided w/ dinner tray

## 2019-08-08 NOTE — ED Notes (Signed)
Patient ambulatory to bedside commode with standby assist.  Patient appears unsteady on feet.

## 2019-08-08 NOTE — ED Notes (Signed)
Date and time results received: 08/08/19 1038 (use smartphrase ".now" to insert current time)  Test: Lactic Critical Value: 2.0  Name of Provider Notified: Dr. Corky Downs  Orders Received? Or Actions Taken?: Orders Received - See Orders for details

## 2019-08-08 NOTE — ED Notes (Signed)
Lab at bedside

## 2019-08-08 NOTE — ED Notes (Signed)
ED Provider at bedside. 

## 2019-08-08 NOTE — Consult Note (Signed)
Pharmacy Antibiotic Note  Patrick Downs is a 44 y.o. male admitted on 08/08/2019 with sepsis.  Patient receives hemodialysis on Tuesday, Thursday and Saturdays. Per Nephrologist, will continue this schedule in patient.Pharmacy has been consulted for Vancomycin and Cefepime dosing.  Plan: 1) Will order Vancomycin 1g IV to be given during on dialysis days(expected to be Tuesday, Thurdsday, and Saturdays)  2) Cefepime 1g Q24 hour  Height: 6\' 2"  (188 cm) Weight: 117.9 kg (260 lb) IBW/kg (Calculated) : 82.2  Temp (24hrs), Avg:103.1 F (39.5 C), Min:103.1 F (39.5 C), Max:103.1 F (39.5 C)  Recent Labs  Lab 08/08/19 0920 08/08/19 0950 08/08/19 1230  WBC  --  9.8  --   CREATININE  --  12.07*  --   LATICACIDVEN 2.0*  --  2.5*    Estimated Creatinine Clearance: 10.9 mL/min (A) (by C-G formula based on SCr of 12.07 mg/dL (H)).    Allergies  Allergen Reactions  . Ondansetron Other (See Comments)    Stomach pain   . Minoxidil Other (See Comments)    "put fluid around my heart", PERICARDIAL EFFUSION  . Morphine And Related Other (See Comments)    Aggressive   . Omnipaque [Iohexol] Itching and Other (See Comments)    Rigors on one occasion, widespread itching on a separate occasion (resolved with Benadryl), tremors    Antimicrobials this admission: Vancomycin 4/16 >>  Cefepime 4/16 >>   Microbiology results: 4/16 BCx: pending 4/16 UCx: pending  4/16 Respiratory Panel: pending  Thank you for allowing pharmacy to be a part of this patient's care.  Noriel Guthrie A Antar Milks 08/08/2019 2:08 PM

## 2019-08-08 NOTE — ED Provider Notes (Signed)
[facility] Emergency Department Provider Note   ____________________________________________    I have reviewed the triage vital signs and the nursing notes.   HISTORY  Chief Complaint Peripheral neuropathy    HPI Patrick Downs is a 44 y.o. 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  He does wear leg braces, decree sensation in his feet.  Recently over the last 24 hours neuropathic pain in his feet have become much worse and he has had difficulty keeping his feet still because of the pain.  He does report that he has a wound to the lateral edge of his right foot but denies any other rashes.  Did receive dialysis yesterday, less fluid taken off than typical per patient.  No cough, shortness of breath.  Has not had Covid vaccination because vaccine is what caused Guyon Barr syndrome  Past Medical History:  Diagnosis Date  . Depression   . Dialysis patient ([place])   . ESRD (end stage renal disease) on dialysis ([place])   . Failure to thrive in adult   . Gout   . Guillain Barr syndrome ([place])   . HTN (hypertension)   . Kidney failure   . Pneumonia   . Renal insufficiency   . Respiratory failure [facility])     Patient Active Problem List   Diagnosis Date Noted  . Murmur 02/19/2019  . GERD (gastroesophageal reflux disease) 03/01/2018  . Degenerative disc disease, lumbar 01/09/2018  . HTN (hypertension) 12/28/2017  . Hyperkalemia 08/16/2017  . Erectile dysfunction due to diseases classified elsewhere 05/31/2016  . Polyneuropathy, unspecified 08/02/2015  . Anemia, unspecified 08/02/2015  . Gastroenteritis 08/02/2015  . Heart failure, unspecified ([place]) 08/02/2015  . Secondary hyperparathyroidism of renal origin ([place]) 08/02/2015  . Encounter for fitting of gastrointestinal device 06/04/2015  . Idiopathic chronic hypotension 12/11/2014  . Rectal  pain 11/27/2014  . Other specified diseases of anus and rectum 11/27/2014  . Encounter for imaging study to confirm nasogastric (NG) tube placement   . Encounter for nasogastric (NG) tube placement   . Respiratory failure, acute ([place])   . Guillain Barr syndrome, lower ext weakness 08/27/2014  . CAP (community acquired pneumonia) 08/23/2014  . ESRD on dialysis ([place]) 08/23/2014  . A-V fistula ([place]) 08/23/2014  . ARDS (adult respiratory distress syndrome) ([place]) 08/23/2014  . Septic shock ([place]) 08/23/2014  . Hypotension 08/23/2014  . Acute respiratory failure ([place]) 08/23/2014  . Fever   . Sepsis ([place]) 08/22/2014  . Type 2 diabetes mellitus ([place]) 07/01/2014  . Type 2 diabetes mellitus with diabetic polyneuropathy ([place]) 07/01/2014  . Photophobia 12/11/2013  . Spells 10/30/2013  . Dizziness 10/30/2013  . Other mechanical complication of surgically created arteriovenous fistula, initial encounter ([place]) 02/19/2013  . Foot drop 12/09/2012  . History of Guillain-Barre syndrome 12/09/2012    Past Surgical History:  Procedure Laterality Date  . A/V FISTULAGRAM Right 01/07/2018   Procedure: A/V FISTULAGRAM;  Surgeon: Algernon Huxley, MD;  Location: [employer] CV LAB;  Service: Cardiovascular;  Laterality: Right;  . AMPUTATION TOE Right 06/08/2017   Procedure: AMPUTATION TOE RIGHT FIFTH TOE;  Surgeon: Samara Deist, DPM;  Location: [facility] ORS;  Service: Podiatry;  Laterality: Right;  . AMPUTATION TOE Left 05/17/2018   Procedure: RAY LEFT 5TH;  Surgeon: Samara Deist, DPM;  Location: [facility] ORS;  Service: Podiatry;  Laterality: Left;  . AV FISTULA PLACEMENT  x5      2 graphs  . DIALYSIS/PERMA CATHETER INSERTION N/A 01/20/2019   Procedure: DIALYSIS/PERMA CATHETER INSERTION;  Surgeon: Algernon Huxley, MD;  Location: [place] CV LAB;  Service: Cardiovascular;  Laterality: N/A;  . DIALYSIS/PERMA CATHETER REMOVAL N/A 06/17/2018   Procedure: DIALYSIS/PERMA CATHETER REMOVAL;  Surgeon: Algernon Huxley, MD;   Location: [place] CV LAB;  Service: Cardiovascular;  Laterality: N/A;  . PARATHYROIDECTOMY    . PERIPHERAL VASCULAR THROMBECTOMY Right 01/22/2019   Procedure: PERIPHERAL VASCULAR THROMBECTOMY;  Surgeon: Algernon Huxley, MD;  Location: [place] CV LAB;  Service: Cardiovascular;  Laterality: Right;  . RENAL BIOPSY    . REVISON OF ARTERIOVENOUS FISTULA Right 02/07/2018   Procedure: REVISON OF ARTERIOVENOUS FISTULA;  Surgeon: Algernon Huxley, MD;  Location: [facility] ORS;  Service: Vascular;  Laterality: Right;  . TEE WITHOUT CARDIOVERSION N/A 01/22/2018   Procedure: TRANSESOPHAGEAL ECHOCARDIOGRAM (TEE);  Surgeon: Dionisio David, MD;  Location: [facility] ORS;  Service: Cardiovascular;  Laterality: N/A;  . tonsiilectomy    . tracheotomy      Prior to Admission medications   Medication Sig Start Date End Date Taking? Authorizing Provider  acetaminophen (TYLENOL) 500 MG tablet Take 1,000-1,500 mg by mouth 2 (two) times daily as needed for moderate pain or headache.    [provider]  calcium acetate (PHOSLO) 667 MG capsule Take 3 capsules (2,001 mg total) by mouth 3 (three) times daily with meals. Patient taking differently: Take 1,334-2,001 mg by mouth 3 (three) times daily with meals.  08/26/14   [name], MD  gabapentin (NEURONTIN) 300 MG capsule Take 600 mg by mouth at bedtime.     [provider]  midodrine (PROAMATINE) 5 MG tablet Take 5 mg by mouth See admin instructions. Take 2 tablets (10 mg) by mouth before dialysis on Tuesdays, Thursdays, & Saturday; Take 1 tablet (5 mg) by mouth in the evening on  Tuesdays, Thursdays, & Saturdays. On non-dialysis days take 1 tablet (5 mg) by mouth every 8 hours.    [provider]  oxyCODONE-acetaminophen (PERCOCET) 10-325 MG tablet Take 1 tablet by mouth every 6 (six) hours as needed for pain. 08/28/19 09/27/19  Molli Barrows, MD  oxyCODONE-acetaminophen (PERCOCET) 10-325 MG tablet Take 1 tablet by mouth every 6 (six) hours as  needed for pain. 09/27/19 10/27/19  Molli Barrows, MD  sildenafil (REVATIO) 20 MG tablet Take 20-100 mg by mouth as needed (for ED).  03/02/17   [provider]  vancomycin IVPB Inject into the vein. Gets three days a week at dialysis    [provider]     Allergies Ondansetron, Minoxidil, Morphine and related, and Omnipaque [iohexol]  Family History  Problem Relation Age of Onset  . Diabetes Mellitus II Father   . Kidney disease Father   . Kidney failure Paternal Grandfather   . Prostate cancer Neg Hx   . Kidney cancer Neg Hx   . Bladder Cancer Neg Hx     Social History Social History   Tobacco Use  . Smoking status: Never Smoker  . Smokeless tobacco: Never Used  Substance Use Topics  . Alcohol use: No  . Drug use: Yes    Types: Marijuana    Comment: last time was June    Review of Systems  Constitutional: No fever/chills Eyes: No visual changes.  ENT: No sore throat. Cardiovascular: Denies chest pain. Respiratory: Denies shortness of breath. Gastrointestinal: No abdominal pain.  No nausea, no vomiting.  Genitourinary: Negative for dysuria. Musculoskeletal: As above Skin: Negative for rash. Neurological: Negative for headaches or weakness   ____________________________________________   PHYSICAL EXAM:  VITAL SIGNS: ED Triage Vitals  Enc Vitals Group     BP 08/08/19 0927 (!) 130/113     Pulse Rate 08/08/19 0927 (!) 127     Resp 08/08/19 0927 20     Temp 08/08/19 0928 (!) 103.1 F (39.5 C)     Temp Source 08/08/19 0928 Oral     SpO2 08/08/19 0927 96 %     Weight 08/08/19 0925 117.9 kg (260 lb)     Height 08/08/19 0925 1.88 m (6\' 2" )     Head Circumference --      Peak Flow --      Pain Score 08/08/19 0925 10     Pain Loc --      Pain Edu? --      Excl. in [place]? --     Constitutional: Alert and oriented.  Moving legs constantly Eyes: Conjunctivae are normal.  Head: Atraumatic. Nose: No congestion/rhinnorhea. Mouth/Throat: Mucous  membranes are moist.   Neck:  Painless ROM Cardiovascular: Normal rate, regular rhythm. Grossly normal heart sounds.  Good peripheral circulation.  Right chest dialysis catheter, no redness tenderness or discharge Respiratory: Normal respiratory effort.  No retractions. Lungs CTAB. Gastrointestinal: Soft and nontender. No distention.  No CVA tenderness.  Musculoskeletal: Both feet examined, small ulceration on the right lateral midfoot, possibly some mild erythema but overall reassuring exam Neurologic:  Normal speech and language. No gross focal neurologic deficits are appreciated.  Skin:  Skin is warm, dry Psychiatric: Mood and affect are normal. Speech and behavior are normal.  ____________________________________________   LABS (all labs ordered are listed, but only abnormal results are displayed)  Labs Reviewed  LACTIC ACID, PLASMA - Abnormal; Notable for the following components:      Result Value   Lactic Acid, Venous 2.0 (*)    All other components within normal limits  COMPREHENSIVE METABOLIC PANEL - Abnormal; Notable for the following components:   Potassium 5.5 (*)    CO2 21 (*)    Glucose, Bld 113 (*)    BUN 54 (*)    Creatinine, Ser 12.07 (*)    Calcium 8.7 (*)    GFR calc non Af Amer 5 (*)    GFR calc Af Amer 5 (*)    Anion gap 16 (*)    All other components within normal limits  CBC WITH DIFFERENTIAL/PLATELET - Abnormal; Notable for the following components:   RBC 4.09 (*)    Hemoglobin 12.0 (*)    HCT 37.0 (*)    RDW 16.1 (*)    Platelets 143 (*)    Neutro Abs 8.6 (*)    Lymphs Abs 0.5 (*)    All other components within normal limits  APTT - Abnormal; Notable for the following components:   aPTT 39 (*)    All other components within normal limits  POC SARS CORONAVIRUS 2 AG -  ED - Normal  CULTURE, BLOOD (ROUTINE X 2)  CULTURE, BLOOD (ROUTINE X 2)  URINE CULTURE  RESPIRATORY PANEL BY RT PCR (FLU A&B, COVID)  PROTIME-INR  LACTIC ACID, PLASMA   URINALYSIS, ROUTINE W REFLEX MICROSCOPIC   ____________________________________________  EKG  ED ECG REPORT I, Lavonia Drafts, the attending physician, personally viewed and interpreted this ECG.  Date: 08/08/2019  Rhythm: Sinus tachycardia QRS Axis: normal Intervals: normal ST/T Wave abnormalities: normal Narrative Interpretation: no evidence  of acute ischemia  ____________________________________________  RADIOLOGY  Chest x-ray reviewed by me, no clear pneumonia pneumothorax or other abnormality ____________________________________________   PROCEDURES  Procedure(s) performed: No  Procedures   Critical Care performed: yes  CRITICAL CARE Performed by: Lavonia Drafts   Total critical care time: 30 minutes  Critical care time was exclusive of separately billable procedures and treating other patients.  Critical care was necessary to treat or prevent imminent or life-threatening deterioration.  Critical care was time spent personally by me on the following activities: development of treatment plan with patient and/or surrogate as well as nursing, discussions with consultants, evaluation of patient's response to treatment, examination of patient, obtaining history from patient or surrogate, ordering and performing treatments and interventions, ordering and review of laboratory studies, ordering and review of radiographic studies, pulse oximetry and re-evaluation of patient's condition.  ____________________________________________   INITIAL IMPRESSION / ASSESSMENT AND PLAN / ED COURSE  Pertinent labs & imaging results that were available during my care of the patient were reviewed by me and considered in my medical decision making (see chart for details).  Patient presents with complaints of worsening neuropathy, describes severe burning in his feet bilaterally.  He does take gabapentin for this.  Reports compliance with his medications.  Noted to be febrile and  tachycardic upon arrival.  Denies COVID-19 symptoms.  Did receive dialysis yesterday.  Has wound on the right foot possibly some mild erythema.  Suspicious for infection versus sepsis versus possible COVID-19  White blood cell count is normal.  Lactic acid is elevated at 2.  Broad-spectrum antibiotics initiated, unclear source.  Chest x-ray demonstrates some nodular changes, nonspecific.  Patient is mildly hyperkalemic no EKG changes  Covid antigen swab negative, will send PCR.  Have discussed with the hospitalist for admission    ____________________________________________   FINAL CLINICAL IMPRESSION(S) / ED DIAGNOSES  Final diagnoses:  Sepsis, due to unspecified organism, unspecified whether acute organ dysfunction present [facility])  Hyperkalemia        Note:  This document was prepared using Dragon voice recognition software and may include unintentional dictation errors.   Lavonia Drafts, MD 08/08/19 1204

## 2019-08-09 ENCOUNTER — Inpatient Hospital Stay: Payer: Medicare HMO

## 2019-08-09 DIAGNOSIS — Z992 Dependence on renal dialysis: Secondary | ICD-10-CM

## 2019-08-09 DIAGNOSIS — E875 Hyperkalemia: Secondary | ICD-10-CM

## 2019-08-09 LAB — HEPATITIS B SURFACE ANTIBODY,QUALITATIVE: Hep B S Ab: REACTIVE — AB

## 2019-08-09 LAB — CORTISOL-AM, BLOOD: Cortisol - AM: 13.2 ug/dL (ref 6.7–22.6)

## 2019-08-09 LAB — HEPATITIS B SURFACE ANTIGEN: Hepatitis B Surface Ag: NONREACTIVE

## 2019-08-09 LAB — PHOSPHORUS: Phosphorus: 3.7 mg/dL (ref 2.5–4.6)

## 2019-08-09 LAB — PROCALCITONIN: Procalcitonin: 2.52 ng/mL

## 2019-08-09 LAB — HEPATITIS B CORE ANTIBODY, TOTAL: Hep B Core Total Ab: NONREACTIVE

## 2019-08-09 LAB — PROTIME-INR
INR: 1.2 (ref 0.8–1.2)
Prothrombin Time: 15.1 seconds (ref 11.4–15.2)

## 2019-08-09 LAB — HEPATITIS C ANTIBODY: HCV Ab: NONREACTIVE

## 2019-08-09 MED ORDER — OXYCODONE HCL 5 MG PO TABS
5.0000 mg | ORAL_TABLET | Freq: Once | ORAL | Status: AC
  Administered 2019-08-09: 5 mg via ORAL
  Filled 2019-08-09: qty 1

## 2019-08-09 MED ORDER — CALCIUM ACETATE (PHOS BINDER) 667 MG PO CAPS
667.0000 mg | ORAL_CAPSULE | Freq: Three times a day (TID) | ORAL | Status: DC
  Administered 2019-08-10: 667 mg via ORAL
  Filled 2019-08-09 (×3): qty 1

## 2019-08-09 MED ORDER — HYDROXYZINE HCL 25 MG PO TABS
25.0000 mg | ORAL_TABLET | Freq: Three times a day (TID) | ORAL | Status: DC | PRN
  Administered 2019-08-09: 25 mg via ORAL
  Filled 2019-08-09: qty 1

## 2019-08-09 MED ORDER — OXYCODONE-ACETAMINOPHEN 10-325 MG PO TABS: 1.0000 | ORAL_TABLET | Freq: Four times a day (QID) | ORAL | Status: DC | PRN

## 2019-08-09 MED ORDER — OXYCODONE-ACETAMINOPHEN 5-325 MG PO TABS
1.0000 | ORAL_TABLET | Freq: Four times a day (QID) | ORAL | Status: DC | PRN
  Administered 2019-08-09 – 2019-08-10 (×2): 1 via ORAL
  Filled 2019-08-09 (×2): qty 1

## 2019-08-09 MED ORDER — OXYCODONE HCL 5 MG PO TABS
5.0000 mg | ORAL_TABLET | Freq: Four times a day (QID) | ORAL | Status: DC | PRN
  Administered 2019-08-09 – 2019-08-10 (×3): 5 mg via ORAL
  Filled 2019-08-09 (×3): qty 1

## 2019-08-09 MED ORDER — HYDROXYZINE HCL 10 MG PO TABS: 10.0000 mg | ORAL_TABLET | Freq: Three times a day (TID) | ORAL | Status: DC | PRN

## 2019-08-09 NOTE — Progress Notes (Signed)
Post HD Assessment    08/09/19 1026  Neurological  Level of Consciousness Alert  Orientation Level Oriented X4  Respiratory  Respiratory Pattern Regular  Chest Assessment Chest expansion symmetrical  Bilateral Breath Sounds Clear  Cardiac  Cardiac Rhythm ST  Integumentary  Integumentary (WDL) WDL  Skin Color Appropriate for ethnicity  Musculoskeletal  Musculoskeletal (WDL) WDL  GU Assessment  Genitourinary (WDL) X  Genitourinary Symptoms Anuria  Psychosocial  Psychosocial (WDL) WDL

## 2019-08-09 NOTE — Progress Notes (Signed)
HD Initiated: Discussed HD tx orders with pt r/t duration of tx and fluid removal. Pt current weight 118.3kg with stated target weight of 114 kg. MD order UFG of 1 kg; pt request increase in goal. Will review with MD.      08/09/19 1034  Vital Signs  Temp 98.6 F (37 C)  Temp Source Oral  Pulse Rate 84  Pulse Rate Source Dinamap  Resp 16  BP 115/64  BP Location Left Arm  BP Method Automatic  Patient Position (if appropriate) Lying  Oxygen Therapy  SpO2 97 %  O2 Device Room Air  Pain Assessment  Pain Scale 0-10  Pain Score 0  Dialysis Weight  Weight 118.3 kg  Type of Weight Pre-Dialysis  During Hemodialysis Assessment  Blood Flow Rate (mL/min) 350 mL/min  Arterial Pressure (mmHg) -190 mmHg  Venous Pressure (mmHg) 120 mmHg  Transmembrane Pressure (mmHg) 60 mmHg  Ultrafiltration Rate (mL/min) 430 mL/min  Dialysate Flow Rate (mL/min) 600 ml/min  Conductivity: Machine  14.2  HD Safety Checks Performed Yes  Dialysis Fluid Bolus Normal Saline  Bolus Amount (mL) 250 mL  Intra-Hemodialysis Comments Tx initiated

## 2019-08-09 NOTE — Progress Notes (Signed)
PROGRESS NOTE    Patrick Downs  HEN:[id number] DOB: 1975-09-05 DOA: 08/08/2019 PCP: Baxter Hire, MD   Chief Complaint  Patient presents with  . Foot Burn  . 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  He presented to the ED with sepsis associated with fever of 103 F, tachycardic in 120s and hypotensive with blood pressure of 80/50 mmHg and elevated lactic acid of 2.5.  Admitted with sepsis of unclear etiology.   Assessment & Plan:   Principal problem  severe sepsis without septic shock Etiology unclear.  Sepsis currently resolved, patient afebrile and blood pressure stable.  Seen during hemodialysis today.  No shortness of breath, cough or chest tightness.  Chest x-ray was concerning for possible right lung infiltrate.  CT chest without contrast done which was negative for any infiltrate or effusion. On empiric IV vancomycin ,cefepime and Flagyl.  Follow blood cultures.  Does not make any urine.  Patient tested negative for COVID-19.  Has a right-sided permacath for dialysis since 01/2019.  Active problems ESRD on hemodialysis Renal following.  Getting scheduled dialysis today.  Hyperkalemia Correct with hemodialysis.  Chronic hypotension On midodrine.  Guillain-Barr syndrome with severe neuropathy Continue Neurontin  Chronic low back pain Reported being on Percocet 10 mg every 6 hours which I will resume.  Plan DVT prophylaxis: Subcu heparin Code Status: Full code  Family Communication: None at bedside Status is: Inpatient.  Patient continues to be monitored as inpatient given his sepsis and pending final cultures result and treatment plan.   Dispo: The patient is from: home              Anticipated d/c is to: home              Anticipated d/c date  is: 4/18 if no further sepsis and pending final culture results.              Patient currently improving with the sepsis.  However needs to be monitored as inpatient for next 24 hours for no further sepsis and final culture results.        Consultants:   Renal   Procedures: CT chest without contrast  Antimicrobials: IV vancomycin and cefepime     Subjective: Seen during dialysis.  Denies any shortness of breath or cough.  Afebrile.  Objective: Vitals:   08/09/19 1230 08/09/19 1245 08/09/19 1300 08/09/19 1315  BP: 105/70   122/77  Pulse: 92 92 95 94  Resp:      Temp:      TempSrc:      SpO2: 98% 98% 100% 98%  Weight:      Height:        Intake/Output Summary (Last 24 hours) at 08/09/2019 1332 Last data filed at 08/09/2019 0641 Gross per 24 hour  Intake 2182.72 ml  Output --  Net 2182.72 ml   Filed Weights   08/08/19 2304 08/09/19 1027 08/09/19 1034  Weight: 118.3 kg 118.3 kg 118.3 kg    Examination:  General: Middle-aged male not in distress HEENT: Moist mucosa, supple neck Chest: Clear bilaterally, right-sided PermCath CVs: Normal S1-S2, no murmurs, rubs or gallop GI: Soft, nondistended nontender Musculoskeletal: Warm, no edema    Data Reviewed: I have personally reviewed following labs and imaging studies  CBC: Recent Labs  Lab 08/08/19 0950  WBC 9.8  NEUTROABS 8.6*  HGB 12.0*  HCT 37.0*  MCV 90.5  PLT 143*    Basic Metabolic Panel: Recent Labs  Lab 08/08/19 0950 08/09/19 1040  NA 137  --   K 5.5*  --   CL 100  --   CO2 21*  --   GLUCOSE 113*  --   BUN 54*  --   CREATININE 12.07*  --   CALCIUM 8.7*  --   PHOS  --  3.7    GFR: Estimated Creatinine Clearance: 10.6 mL/min (A) (by C-G formula based on SCr of 12.07 mg/dL (H)).  Liver Function Tests: Recent Labs  Lab 08/08/19 0950  AST 21  ALT 21  ALKPHOS 63  BILITOT 1.0  PROT 8.0  ALBUMIN 4.2    CBG: No results for input(s): GLUCAP in the last 168 hours.   Recent  Results (from the past 240 hour(s))  Respiratory Panel by RT PCR (Flu A&B, Covid) - Nasopharyngeal Swab     Status: None   Collection Time: 08/08/19  9:08 AM   Specimen: Nasopharyngeal Swab  Result Value Ref Range Status   SARS Coronavirus 2 by RT PCR NEGATIVE NEGATIVE Final    Comment: (NOTE) SARS-CoV-2 target nucleic acids are NOT DETECTED. The SARS-CoV-2 RNA is generally detectable in upper respiratoy specimens during the acute phase of infection. The lowest concentration of SARS-CoV-2 viral copies this assay can detect is 131 copies/mL. A negative result does not preclude SARS-Cov-2 infection and should not be used as the sole basis for treatment or other patient management decisions. A negative result may occur with  improper specimen collection/handling, submission of specimen other than nasopharyngeal swab, presence of viral mutation(s) within the areas targeted by this assay, and inadequate number of viral copies (<131 copies/mL). A negative result must be combined with clinical observations, patient history, and epidemiological information. The expected result is Negative. Fact Sheet for Patients:  PinkCheek.be Fact Sheet for Healthcare Providers:  GravelBags.it This test is not yet ap proved or cleared by the [place] FDA and  has been authorized for detection and/or diagnosis of SARS-CoV-2 by FDA under an Emergency Use Authorization (EUA). This EUA will remain  in effect (meaning this test can be used) for the duration of the COVID-19 declaration under Section 564(b)(1) of the Act, 21 U.S.C. section 360bbb-3(b)(1), unless the authorization is terminated or revoked sooner.    Influenza A by PCR NEGATIVE NEGATIVE Final   Influenza B by PCR NEGATIVE NEGATIVE Final    Comment: (NOTE) The Xpert Xpress SARS-CoV-2/FLU/RSV assay is intended as an aid in  the diagnosis of influenza from Nasopharyngeal swab specimens  and  should not be used as a sole basis for treatment. Nasal washings and  aspirates are unacceptable for Xpert Xpress SARS-CoV-2/FLU/RSV  testing. Fact Sheet for Patients: PinkCheek.be Fact Sheet for Healthcare Providers: GravelBags.it This test is not yet approved or cleared by the [place] FDA and  has been authorized for detection and/or diagnosis of SARS-CoV-2 by  FDA under an Emergency Use Authorization (EUA). This EUA will remain  in effect (meaning this test can be used) for the duration of the  Covid-19 declaration under Section 564(b)(1) of the Act, 21  U.S.C. section 360bbb-3(b)(1), unless the authorization is  terminated or revoked. Performed at [facility], [address] [place], [place] [address]   Blood Culture (routine x 2)     Status: None (Preliminary result)   Collection Time: 08/08/19  9:50 AM   Specimen: BLOOD  Result Value Ref Range Status   Specimen Description BLOOD BLOOD LEFT FOREARM  Final   Special Requests   Final    BOTTLES DRAWN AEROBIC AND ANAEROBIC Blood Culture adequate volume   Culture   Final    NO GROWTH < 24 HOURS Performed at [facility], [address], [place], [place] [address]    Report Status PENDING  Incomplete  Blood Culture (routine x 2)     Status: None (Preliminary result)   Collection Time: 08/08/19  9:50 AM   Specimen: BLOOD  Result Value Ref Range Status   Specimen Description BLOOD BLOOD LEFT ARM  Final   Special Requests   Final    BOTTLES DRAWN AEROBIC AND ANAEROBIC Blood Culture results may not be optimal due to an excessive volume of blood received in culture bottles   Culture   Final    NO GROWTH < 24 HOURS Performed at [facility], [address], [place], [place] [address]    Report Status PENDING  Incomplete         Radiology Studies: CT CHEST WO CONTRAST  Result Date: 08/09/2019 CLINICAL DATA:  Pneumonia. EXAM:  CT CHEST WITHOUT CONTRAST TECHNIQUE: Multidetector CT imaging of the chest was performed following the standard protocol without IV contrast. COMPARISON:  August 21, 2014. August 08, 2019. FINDINGS: Cardiovascular: Atherosclerosis of thoracic aorta is noted without aneurysm formation. Normal cardiac size. No pericardial effusion. Moderate coronary artery calcifications are noted. Right internal jugular catheter tip is seen at cavoatrial junction. Mediastinum/Nodes: No enlarged mediastinal or axillary lymph nodes. Thyroid gland, trachea, and esophagus demonstrate no significant findings. Lungs/Pleura: No pneumothorax or pleural effusion is noted. Multiple calcifications are noted throughout both lungs suggesting prior granulomatous disease. No acute consolidation is noted. Upper Abdomen: Findings consistent with polycystic kidney disease. Musculoskeletal: Sclerosis is noted throughout the skeleton consistent with renal osteodystrophy. IMPRESSION: 1. Moderate coronary artery calcifications are noted suggesting coronary artery disease. 2. Multiple calcifications are noted throughout both lungs suggesting prior granulomatous disease. 3. Findings consistent with polycystic kidney disease. 4. Sclerosis is noted throughout the skeleton consistent with renal osteodystrophy. Aortic Atherosclerosis (ICD10-I70.0). Electronically Signed   By: Marijo Conception M.D.   On: 08/09/2019 11:53   DG Chest Port 1 View  Result Date: 08/08/2019 CLINICAL DATA:  Fever and neuropathy. EXAM: PORTABLE CHEST 1 VIEW COMPARISON:  01/19/2018 FINDINGS: Right-sided dual lumen catheter terminates in the upper right atrium. Cardiomediastinal contours and hilar structures are normal. Central airways grossly unremarkable. Pulmonary vascular engorgement. No signs of frank pulmonary edema. Subtle nodular pattern is suggested in the chest with miliary distribution and appearance. Visualized skeletal structures are unremarkable. Evidence of prior left  axillary stenting is similar to previous exams. IMPRESSION: 1. Question small nodular pattern in the chest. Correlate with any signs of infection. Chest CT may be helpful for further evaluation. 2. Pulmonary vascular engorgement without frank pulmonary edema. Electronically Signed   By: Zetta Bills M.D.   On: 08/08/2019 10:17        Scheduled Meds: . Chlorhexidine Gluconate Cloth  6 each Topical Q0600  . gabapentin  600 mg Oral QHS  . heparin  5,000 Units Subcutaneous Q8H  . midodrine  5 mg Oral TID WC  . sodium zirconium cyclosilicate  10 g Oral Daily   Continuous Infusions: . ceFEPime (MAXIPIME) IV    . lactated ringers     And  . lactated ringers  And  . lactated ringers    . metronidazole 500 mg (08/09/19 0921)  . vancomycin       LOS: 1 day    Time spent: 35 minutes    Nakoma Gotwalt, MD [facility]   To contact the attending provider between 7A-7P or the covering provider during after hours 7P-7A, please log into the web site [email or web address] and access using universal [place] password for that web site. If you do not have the password, please call the hospital operator.  08/09/2019, 1:32 PM

## 2019-08-09 NOTE — Progress Notes (Addendum)
Post HD  Blood volume processed 79.7L   08/09/19 1430  Vital Signs  Temp 98 F (36.7 C)  Temp Source Oral  Pulse Rate 95  Pulse Rate Source Dinamap  Resp 16  BP 118/82  BP Location Left Arm  BP Method Automatic  Patient Position (if appropriate) Lying  Oxygen Therapy  SpO2 96 %  O2 Device Room Air  During Hemodialysis Assessment  HD Safety Checks Performed Yes  Dialysis Fluid Bolus Normal Saline  Bolus Amount (mL) 250 mL  Post-Hemodialysis Assessment  Rinseback Volume (mL) 250 mL  Dialyzer Clearance Lightly streaked  Duration of HD Treatment -hour(s) 3.5 hour(s)  Hemodialysis Intake (mL) 500 mL  UF Total -Machine (mL) 1530 mL  Net UF (mL) 1030 mL  Tolerated HD Treatment Yes  Post-Hemodialysis Comments  (n/a)  AVG/AVF Arterial Site Held (minutes)  (n/a)  AVG/AVF Venous Site Held (minutes)  (n/a)

## 2019-08-09 NOTE — Progress Notes (Signed)
[place], [place] 08/09/19  Subjective:   Hospital day # 1  Patient completed dialysis treatment earlier today. Tolerated well.  Renal: 04/16 0701 - 04/17 0700 In: 2482.7 [P.O.:840; IV Piggyback:1642.7] Out: -  Lab Results  Component Value Date   CREATININE 12.07 (H) 08/08/2019   CREATININE 13.96 (H) 02/06/2018   CREATININE 11.39 (H) 01/20/2018     Objective:  Vital signs in last 24 hours:  Temp:  [98 F (36.7 C)-99.1 F (37.3 C)] 98 F (36.7 C) (04/17 1430) Pulse Rate:  [84-117] 95 (04/17 1430) Resp:  [10-20] 16 (04/17 1430) BP: (92-129)/(58-92) 118/82 (04/17 1430) SpO2:  [90 %-100 %] 96 % (04/17 1430) Weight:  [118.3 kg] 118.3 kg (04/17 1034)  Weight change:  Filed Weights   08/08/19 2304 08/09/19 1027 08/09/19 1034  Weight: 118.3 kg 118.3 kg 118.3 kg    Intake/Output:    Intake/Output Summary (Last 24 hours) at 08/09/2019 1848 Last data filed at 08/09/2019 1430 Gross per 24 hour  Intake 1040 ml  Output 1030 ml  Net 10 ml     Physical Exam: General:  No acute distress, laying in the bed  HEENT  moist oral mucous membrane  Pulm/lungs  CTAB normal effort  CVS/Heart  no rub  Abdomen:   Soft, nontender  Extremities:  No peripheral edema  Neurologic:  Awake, alert, conversant  Skin:  Dry skin  Access:  Right IJ PermCath       Basic Metabolic Panel:  Recent Labs  Lab 08/08/19 0950 08/09/19 1040  NA 137  --   K 5.5*  --   CL 100  --   CO2 21*  --   GLUCOSE 113*  --   BUN 54*  --   CREATININE 12.07*  --   CALCIUM 8.7*  --   PHOS  --  3.7     CBC: Recent Labs  Lab 08/08/19 0950  WBC 9.8  NEUTROABS 8.6*  HGB 12.0*  HCT 37.0*  MCV 90.5  PLT 143*      Lab Results  Component Value Date   HEPBSAG NON REACTIVE 08/09/2019   HEPBSAB Reactive (A) 08/09/2019   HEPBIGM NON REACTIVE 08/29/2014      Microbiology:  Recent Results (from the past 240 hour(s))  Respiratory Panel by RT PCR (Flu A&B, Covid)  - Nasopharyngeal Swab     Status: None   Collection Time: 08/08/19  9:08 AM   Specimen: Nasopharyngeal Swab  Result Value Ref Range Status   SARS Coronavirus 2 by RT PCR NEGATIVE NEGATIVE Final    Comment: (NOTE) SARS-CoV-2 target nucleic acids are NOT DETECTED. The SARS-CoV-2 RNA is generally detectable in upper respiratoy specimens during the acute phase of infection. The lowest concentration of SARS-CoV-2 viral copies this assay can detect is 131 copies/mL. A negative result does not preclude SARS-Cov-2 infection and should not be used as the sole basis for treatment or other patient management decisions. A negative result may occur with  improper specimen collection/handling, submission of specimen other than nasopharyngeal swab, presence of viral mutation(s) within the areas targeted by this assay, and inadequate number of viral copies (<131 copies/mL). A negative result must be combined with clinical observations, patient history, and epidemiological information. The expected result is Negative. Fact Sheet for Patients:  PinkCheek.be Fact Sheet for Healthcare Providers:  GravelBags.it This test is not yet ap proved or cleared by the [place] FDA and  has been authorized for detection and/or diagnosis of SARS-CoV-2 by  FDA under an Emergency Use Authorization (EUA). This EUA will remain  in effect (meaning this test can be used) for the duration of the COVID-19 declaration under Section 564(b)(1) of the Act, 21 U.S.C. section 360bbb-3(b)(1), unless the authorization is terminated or revoked sooner.    Influenza A by PCR NEGATIVE NEGATIVE Final   Influenza B by PCR NEGATIVE NEGATIVE Final    Comment: (NOTE) The Xpert Xpress SARS-CoV-2/FLU/RSV assay is intended as an aid in  the diagnosis of influenza from Nasopharyngeal swab specimens and  should not be used as a sole basis for treatment. Nasal washings and   aspirates are unacceptable for Xpert Xpress SARS-CoV-2/FLU/RSV  testing. Fact Sheet for Patients: PinkCheek.be Fact Sheet for Healthcare Providers: GravelBags.it This test is not yet approved or cleared by the [place] FDA and  has been authorized for detection and/or diagnosis of SARS-CoV-2 by  FDA under an Emergency Use Authorization (EUA). This EUA will remain  in effect (meaning this test can be used) for the duration of the  Covid-19 declaration under Section 564(b)(1) of the Act, 21  U.S.C. section 360bbb-3(b)(1), unless the authorization is  terminated or revoked. Performed at [facility], [address] [place], [place] [address]   Blood Culture (routine x 2)     Status: None (Preliminary result)   Collection Time: 08/08/19  9:50 AM   Specimen: BLOOD  Result Value Ref Range Status   Specimen Description BLOOD BLOOD LEFT FOREARM  Final   Special Requests   Final    BOTTLES DRAWN AEROBIC AND ANAEROBIC Blood Culture adequate volume   Culture   Final    NO GROWTH < 24 HOURS Performed at [facility], [address] [place], [place] [address]    Report Status PENDING  Incomplete  Blood Culture (routine x 2)     Status: None (Preliminary result)   Collection Time: 08/08/19  9:50 AM   Specimen: BLOOD  Result Value Ref Range Status   Specimen Description BLOOD BLOOD LEFT ARM  Final   Special Requests   Final    BOTTLES DRAWN AEROBIC AND ANAEROBIC Blood Culture results may not be optimal due to an excessive volume of blood received in culture bottles   Culture   Final    NO GROWTH < 24 HOURS Performed at [facility], [address] [place], [place] [address]    Report Status PENDING  Incomplete    Coagulation Studies: Recent Labs    08/08/19 0950 08/09/19 0510  LABPROT 13.6 15.1  INR 1.1 1.2    Urinalysis: No results for input(s): COLORURINE, LABSPEC, PHURINE,  GLUCOSEU, HGBUR, BILIRUBINUR, KETONESUR, PROTEINUR, UROBILINOGEN, NITRITE, LEUKOCYTESUR in the last 72 hours.  Invalid input(s): APPERANCEUR    Imaging: CT CHEST WO CONTRAST  Result Date: 08/09/2019 CLINICAL DATA:  Pneumonia. EXAM: CT CHEST WITHOUT CONTRAST TECHNIQUE: Multidetector CT imaging of the chest was performed following the standard protocol without IV contrast. COMPARISON:  August 21, 2014. August 08, 2019. FINDINGS: Cardiovascular: Atherosclerosis of thoracic aorta is noted without aneurysm formation. Normal cardiac size. No pericardial effusion. Moderate coronary artery calcifications are noted. Right internal jugular catheter tip is seen at cavoatrial junction. Mediastinum/Nodes: No enlarged mediastinal or axillary lymph nodes. Thyroid gland, trachea, and esophagus demonstrate no significant findings. Lungs/Pleura: No pneumothorax or pleural effusion is noted. Multiple calcifications are noted throughout both lungs suggesting prior granulomatous disease. No acute consolidation is noted. Upper Abdomen: Findings consistent with polycystic kidney disease. Musculoskeletal: Sclerosis is noted throughout the  skeleton consistent with renal osteodystrophy. IMPRESSION: 1. Moderate coronary artery calcifications are noted suggesting coronary artery disease. 2. Multiple calcifications are noted throughout both lungs suggesting prior granulomatous disease. 3. Findings consistent with polycystic kidney disease. 4. Sclerosis is noted throughout the skeleton consistent with renal osteodystrophy. Aortic Atherosclerosis (ICD10-I70.0). Electronically Signed   By: Marijo Conception M.D.   On: 08/09/2019 11:53   DG Chest Port 1 View  Result Date: 08/08/2019 CLINICAL DATA:  Fever and neuropathy. EXAM: PORTABLE CHEST 1 VIEW COMPARISON:  01/19/2018 FINDINGS: Right-sided dual lumen catheter terminates in the upper right atrium. Cardiomediastinal contours and hilar structures are normal. Central airways grossly  unremarkable. Pulmonary vascular engorgement. No signs of frank pulmonary edema. Subtle nodular pattern is suggested in the chest with miliary distribution and appearance. Visualized skeletal structures are unremarkable. Evidence of prior left axillary stenting is similar to previous exams. IMPRESSION: 1. Question small nodular pattern in the chest. Correlate with any signs of infection. Chest CT may be helpful for further evaluation. 2. Pulmonary vascular engorgement without frank pulmonary edema. Electronically Signed   By: Zetta Bills M.D.   On: 08/08/2019 10:17     Medications:   . ceFEPime (MAXIPIME) IV 1 g (08/09/19 1547)  . lactated ringers     And  . lactated ringers     And  . lactated ringers    . metronidazole 500 mg (08/09/19 1639)  . vancomycin 1,000 mg (08/09/19 1826)   . Chlorhexidine Gluconate Cloth  6 each Topical Q0600  . gabapentin  600 mg Oral QHS  . heparin  5,000 Units Subcutaneous Q8H  . midodrine  5 mg Oral TID WC  . sodium zirconium cyclosilicate  10 g Oral Daily     Assessment/ Plan:  44 y.o. male with end-stage renal disease, Hypertension, History of Guillain-Barr syndrome  admitted on 08/08/2019 for Hyperkalemia [E87.5] Sepsis ([place]) [A41.9] Sepsis, due to unspecified organism, unspecified whether acute organ dysfunction present ([place]) [A41.9]  [facility] neph[facility]/TTS  #End-stage renal disease Patient underwent dialysis treatment today.  Tolerated well.  Next dialysis treatment on Tuesday.  #Hyperkalemia Most recent serum potassium was 5.5.  Patient did undergo dialysis treatment today.  Expected to correct.  Would attempt to avoid lactated Ringer's in the future given hyperkalemia.  #Anemia of chronic kidney disease  Lab Results  Component Value Date   HGB 12.0 (L) 08/08/2019  Hold off on Epogen for now given hemoglobin of 12.   #Secondary hyperparathyroidism Lab Results  Component Value Date   PTH 85 (H) 09/23/2014   CALCIUM  8.7 (L) 08/08/2019   PHOS 3.7 08/09/2019  Continue home dose of PhosLo with meals  #Chronic hypotension Continue the patient on midodrine.  Blood cultures thus far negative.      LOS: 1 Bowen Goyal 4/17/20216:48 PM  [place], [place]  Note: This note was prepared with Dragon dictation. Any transcription errors are unintentional

## 2019-08-09 NOTE — Progress Notes (Signed)
Pre HD     08/09/19 1027  Vital Signs  Temp 98.6 F (37 C)  Temp Source Oral  Pulse Rate 87  Pulse Rate Source Dinamap  Resp 19  BP 107/74  BP Location Left Arm  BP Method Automatic  Patient Position (if appropriate) Lying  Oxygen Therapy  SpO2 95 %  O2 Device Room Air  Pain Assessment  Pain Scale 0-10  Pain Score 0  Dialysis Weight  Weight 118.3 kg  Type of Weight Pre-Dialysis  Time-Out for Hemodialysis  What Procedure? HD  Pt Identifiers(min of two) First/Last Name;MRN/Account#;Pt's DOB(use if MRN/Acct# not available  Correct Site? Yes  Correct Side? Yes  Correct Procedure? Yes  Consents Verified? Yes  Rad Studies Available? N/A  Safety Precautions Reviewed? Yes  Engineer, civil (consulting) Number 4  Station Number [id number])  UF/Alarm Test Passed  Conductivity: Meter 14  Conductivity: Machine  14.2  pH 7.2  Reverse Osmosis 2  Normal Saline Lot Number [id number]  Dialyzer Lot Number [id number]  Disposable Set Lot Number [id number]  Machine Temperature 98.6 F (37 C)  Musician and Audible Yes  Blood Lines Intact and Secured Yes  Pre Treatment Patient Checks  Vascular access used during treatment Catheter  HD catheter dressing before treatment WDL  Patient is receiving dialysis in a chair  (no)  Hepatitis B Surface Antigen Results Pending  Isolation Initiated Yes  Date Hepatitis B Surface Antibody Drawn 08/09/19  Hemodialysis Consent Verified  (verbal consent given )  Hemodialysis Standing Orders Initiated Yes  ECG (Telemetry) Monitor On Yes  Prime Ordered Normal Saline  Length of  DialysisTreatment -hour(s) 3.5 Hour(s)  Dialysis Treatment Comments  (Na 140)  Dialyzer Elisio 17H NR  Dialysate 2K;2.5 Ca  Dialysate Flow Ordered 600  Blood Flow Rate Ordered 400 mL/min  Ultrafiltration Goal 1.5 Liters  Dialysis Blood Pressure Support Ordered Normal Saline  Education / Care Plan  Dialysis Education Provided Yes  Documented Education in Care Plan Yes   Outpatient Plan of Care Reviewed and on Chart Yes

## 2019-08-09 NOTE — Progress Notes (Signed)
Pre HD Assessment    08/09/19 1026  Neurological  Level of Consciousness Alert  Orientation Level Oriented X4  Respiratory  Respiratory Pattern Regular  Chest Assessment Chest expansion symmetrical  Bilateral Breath Sounds Clear  Cardiac  Cardiac Rhythm ST  Integumentary  Integumentary (WDL) WDL  Skin Color Appropriate for ethnicity  Musculoskeletal  Musculoskeletal (WDL) WDL  GU Assessment  Genitourinary (WDL) X  Genitourinary Symptoms Anuria  Psychosocial  Psychosocial (WDL) WDL

## 2019-08-09 NOTE — Progress Notes (Addendum)
HD Complete    08/09/19 1429  Vital Signs  Temp 98 F (36.7 C)  Temp Source Oral  Pulse Rate 97  Pulse Rate Source Dinamap  Resp 16  BP 112/80  BP Location Left Arm  BP Method Automatic  Patient Position (if appropriate) Lying  Oxygen Therapy  SpO2 90 %  O2 Device Room Air  During Hemodialysis Assessment  Blood Flow Rate (mL/min) 400 mL/min  Arterial Pressure (mmHg) -180 mmHg  Venous Pressure (mmHg) 150 mmHg  Transmembrane Pressure (mmHg) 50 mmHg  Ultrafiltration Rate (mL/min) 540 mL/min  Dialysate Flow Rate (mL/min) 600 ml/min  Conductivity: Machine  14.2  HD Safety Checks Performed Yes  Intra-Hemodialysis Comments Tx completed

## 2019-08-10 DIAGNOSIS — A419 Sepsis, unspecified organism: Secondary | ICD-10-CM | POA: Diagnosis present

## 2019-08-10 DIAGNOSIS — M5136 Other intervertebral disc degeneration, lumbar region: Secondary | ICD-10-CM

## 2019-08-10 DIAGNOSIS — G610 Guillain-Barre syndrome: Secondary | ICD-10-CM

## 2019-08-10 LAB — CBC
HCT: 34.3 % — ABNORMAL LOW (ref 39.0–52.0)
Hemoglobin: 10.9 g/dL — ABNORMAL LOW (ref 13.0–17.0)
MCH: 29.5 pg (ref 26.0–34.0)
MCHC: 31.8 g/dL (ref 30.0–36.0)
MCV: 93 fL (ref 80.0–100.0)
Platelets: 126 10*3/uL — ABNORMAL LOW (ref 150–400)
RBC: 3.69 MIL/uL — ABNORMAL LOW (ref 4.22–5.81)
RDW: 16.2 % — ABNORMAL HIGH (ref 11.5–15.5)
WBC: 4.6 10*3/uL (ref 4.0–10.5)
nRBC: 0 % (ref 0.0–0.2)

## 2019-08-10 LAB — BASIC METABOLIC PANEL
Anion gap: 12 (ref 5–15)
BUN: 50 mg/dL — ABNORMAL HIGH (ref 6–20)
CO2: 28 mmol/L (ref 22–32)
Calcium: 8 mg/dL — ABNORMAL LOW (ref 8.9–10.3)
Chloride: 100 mmol/L (ref 98–111)
Creatinine, Ser: 11.12 mg/dL — ABNORMAL HIGH (ref 0.61–1.24)
GFR calc Af Amer: 6 mL/min — ABNORMAL LOW (ref >60)
GFR calc non Af Amer: 5 mL/min — ABNORMAL LOW (ref >60)
Glucose, Bld: 101 mg/dL — ABNORMAL HIGH (ref 70–99)
Potassium: 4.4 mmol/L (ref 3.5–5.1)
Sodium: 140 mmol/L (ref 135–145)

## 2019-08-10 MED ORDER — LEVOFLOXACIN 750 MG PO TABS: 750.0000 mg | ORAL_TABLET | Freq: Once | ORAL | Status: DC

## 2019-08-10 MED ORDER — LEVOFLOXACIN 500 MG PO TABS: 500.0000 mg | ORAL_TABLET | ORAL | 0 refills | Status: AC

## 2019-08-10 NOTE — Progress Notes (Signed)
Medication and discharge instruction reviewed with pt, understanding verbalized, IV and telemetry removed. No distress noted.

## 2019-08-10 NOTE — Discharge Summary (Signed)
Physician Discharge Summary  Patrick Downs JHE:[id number] DOB: [birth year] DOA: 08/08/2019  PCP: Baxter Hire, MD  Admit date: 08/08/2019 Discharge date: 08/10/2019  Admitted From: Home Disposition: Home  Recommendations for Outpatient Follow-up:  1. Follow-up with scheduled hemodialysis.  Follow-up with PCP in 1 week. 2. Patient will be discharged on oral Levaquin to complete 7-day course of antibiotic.   Home Health: None Equipment/Devices: None  Discharge Condition: Fair CODE STATUS: Full code Diet recommendation: Renal   Discharge Diagnoses:  Principal Problem:   Sepsis without acute organ dysfunction (HCC)  Active Problems:   ESRD on dialysis ([place])   Guillain Barr syndrome, lower ext weakness   Polyneuropathy, unspecified   Degenerative disc disease, lumbar   Type 2 diabetes mellitus with diabetic polyneuropathy ([place])   Brief narrative/HPI 44 year old male with history of ESRD on hemodialysis (T, T, S), history of DM Barr syndrome with significant residual neuropathy of his feet who reported increasing neuropathy pain in his feet for past 24 hours.  He presented to the ED with sepsis associated with fever of 103 F, tachycardic in 120s and hypotensive with blood pressure of 80/50 mmHg and elevated lactic acid of 2.5.  Admitted with sepsis of unclear etiology. Patient tested negative for SARS-COV-2.  Hospital course  Principal problem  severe sepsis without septic shock Etiology unclear.  Suspect acute viral in illness.  Sepsis resolved following admission and patient remained afebrile with stable blood pressure for over 36 hours.   Blood culture without any growth. No associated symptoms including shortness of breath, cough, chest discomfort, nausea, vomiting, diarrhea or worsening back pain.  Has a right-sided PermCath for dialysis in 01/2019. Chest x-ray was concerning for infiltrate so CT chest without contrast done which was negative for any infiltrate or  effusion. Patient was on empiric IV vancomycin ,cefepime and Flagyl.    Since his sepsis has resolved without clear source and hemodynamically stable since admission I will discharge him on oral Levaquin to complete 7-day course of antibiotic.  Discussed with nephrology who agreed with the plan.  Active problems ESRD on hemodialysis Renal involved in care.  Received scheduled hemodialysis on 4/17.  Continue cinacalcet.  Hyperkalemia Corrected with hemodialysis.  Chronic hypotension On midodrine.  Guillain-Barr syndrome with severe neuropathy Continue Neurontin  Chronic low back pain  on Percocet 10 mg every 6 hours.  No change in his back pain symptoms.  Procedure: CT chest without contrast Family communication: None Disposition: Home   Discharge Instructions   Allergies as of 08/10/2019      Reactions   Ondansetron Other (See Comments)   Stomach pain    Minoxidil Other (See Comments)   "put fluid around my heart", PERICARDIAL EFFUSION   Morphine And Related Other (See Comments)   Aggressive    Omnipaque [iohexol] Itching, Other (See Comments)   Rigors on one occasion, widespread itching on a separate occasion (resolved with Benadryl), tremors      Medication List    TAKE these medications   acetaminophen 500 MG tablet Commonly known as: TYLENOL Take 1,000-1,500 mg by mouth 2 (two) times daily as needed for moderate pain or headache.   calcium acetate 667 MG capsule Commonly known as: PHOSLO Take 3 capsules (2,001 mg total) by mouth 3 (three) times daily with meals. What changed: how much to take   gabapentin 300 MG capsule Commonly known as: NEURONTIN Take 600 mg by mouth at bedtime.   levofloxacin 500 MG tablet Commonly known as: Levaquin Take 1  tablet (500 mg total) by mouth every other day for 6 days. Start taking on: August 11, 2019   midodrine 5 MG tablet Commonly known as: PROAMATINE Take 5 mg by mouth See admin instructions. Take 2 tablets  (10 mg) by mouth before dialysis on Tuesdays, Thursdays, & Saturday; Take 1 tablet (5 mg) by mouth in the evening on  Tuesdays, Thursdays, & Saturdays. On non-dialysis days take 1 tablet (5 mg) by mouth every 8 hours.   oxyCODONE-acetaminophen 10-325 MG tablet Commonly known as: Percocet Take 1 tablet by mouth every 6 (six) hours as needed for pain. Start taking on: Aug 28, 2019   sildenafil 20 MG tablet Commonly known as: REVATIO Take 20-100 mg by mouth as needed (for ED).      Follow-up Information    nephrology with hemodialysis Follow up.          Allergies  Allergen Reactions  . Ondansetron Other (See Comments)    Stomach pain   . Minoxidil Other (See Comments)    "put fluid around my heart", PERICARDIAL EFFUSION  . Morphine And Related Other (See Comments)    Aggressive   . Omnipaque [Iohexol] Itching and Other (See Comments)    Rigors on one occasion, widespread itching on a separate occasion (resolved with Benadryl), tremors        Procedures/Studies: CT CHEST WO CONTRAST  Result Date: 08/09/2019 CLINICAL DATA:  Pneumonia. EXAM: CT CHEST WITHOUT CONTRAST TECHNIQUE: Multidetector CT imaging of the chest was performed following the standard protocol without IV contrast. COMPARISON:  August 21, 2014. August 08, 2019. FINDINGS: Cardiovascular: Atherosclerosis of thoracic aorta is noted without aneurysm formation. Normal cardiac size. No pericardial effusion. Moderate coronary artery calcifications are noted. Right internal jugular catheter tip is seen at cavoatrial junction. Mediastinum/Nodes: No enlarged mediastinal or axillary lymph nodes. Thyroid gland, trachea, and esophagus demonstrate no significant findings. Lungs/Pleura: No pneumothorax or pleural effusion is noted. Multiple calcifications are noted throughout both lungs suggesting prior granulomatous disease. No acute consolidation is noted. Upper Abdomen: Findings consistent with polycystic kidney disease.  Musculoskeletal: Sclerosis is noted throughout the skeleton consistent with renal osteodystrophy. IMPRESSION: 1. Moderate coronary artery calcifications are noted suggesting coronary artery disease. 2. Multiple calcifications are noted throughout both lungs suggesting prior granulomatous disease. 3. Findings consistent with polycystic kidney disease. 4. Sclerosis is noted throughout the skeleton consistent with renal osteodystrophy. Aortic Atherosclerosis (ICD10-I70.0). Electronically Signed   By: Marijo Conception M.D.   On: 08/09/2019 11:53   DG Chest Port 1 View  Result Date: 08/08/2019 CLINICAL DATA:  Fever and neuropathy. EXAM: PORTABLE CHEST 1 VIEW COMPARISON:  01/19/2018 FINDINGS: Right-sided dual lumen catheter terminates in the upper right atrium. Cardiomediastinal contours and hilar structures are normal. Central airways grossly unremarkable. Pulmonary vascular engorgement. No signs of frank pulmonary edema. Subtle nodular pattern is suggested in the chest with miliary distribution and appearance. Visualized skeletal structures are unremarkable. Evidence of prior left axillary stenting is similar to previous exams. IMPRESSION: 1. Question small nodular pattern in the chest. Correlate with any signs of infection. Chest CT may be helpful for further evaluation. 2. Pulmonary vascular engorgement without frank pulmonary edema. Electronically Signed   By: Zetta Bills M.D.   On: 08/08/2019 10:17       Subjective: Remains afebrile and blood pressure stable.  No overnight events.  Discharge Exam: Vitals:   08/10/19 0435 08/10/19 0720  BP: 120/70 116/81  Pulse: 82 80  Resp: 18 19  Temp: 97.7 F (36.5  C) 98.2 F (36.8 C)  SpO2: 100% 95%   Vitals:   08/09/19 2002 08/09/19 2032 08/10/19 0435 08/10/19 0720  BP:  129/78 120/70 116/81  Pulse:  83 82 80  Resp:   18 19  Temp:  98.8 F (37.1 C) 97.7 F (36.5 C) 98.2 F (36.8 C)  TempSrc:  Oral  Oral  SpO2: 93% 96% 100% 95%  Weight:    118.7 kg   Height:        General: Middle-aged male not in distress HEENT: Moist mucosa, supple neck Chest: Clear, right-sided PermCath CVs: Normal S1-S2, no murmurs GI: Soft, nondistended, nontender Musculoskeletal: Warm, no edema    The results of significant diagnostics from this hospitalization (including imaging, microbiology, ancillary and laboratory) are listed below for reference.     Microbiology: Recent Results (from the past 240 hour(s))  Respiratory Panel by RT PCR (Flu A&B, Covid) - Nasopharyngeal Swab     Status: None   Collection Time: 08/08/19  9:08 AM   Specimen: Nasopharyngeal Swab  Result Value Ref Range Status   SARS Coronavirus 2 by RT PCR NEGATIVE NEGATIVE Final    Comment: (NOTE) SARS-CoV-2 target nucleic acids are NOT DETECTED. The SARS-CoV-2 RNA is generally detectable in upper respiratoy specimens during the acute phase of infection. The lowest concentration of SARS-CoV-2 viral copies this assay can detect is 131 copies/mL. A negative result does not preclude SARS-Cov-2 infection and should not be used as the sole basis for treatment or other patient management decisions. A negative result may occur with  improper specimen collection/handling, submission of specimen other than nasopharyngeal swab, presence of viral mutation(s) within the areas targeted by this assay, and inadequate number of viral copies (<131 copies/mL). A negative result must be combined with clinical observations, patient history, and epidemiological information. The expected result is Negative. Fact Sheet for Patients:  PinkCheek.be Fact Sheet for Healthcare Providers:  GravelBags.it This test is not yet ap proved or cleared by the [place] FDA and  has been authorized for detection and/or diagnosis of SARS-CoV-2 by FDA under an Emergency Use Authorization (EUA). This EUA will remain  in effect (meaning this test  can be used) for the duration of the COVID-19 declaration under Section 564(b)(1) of the Act, 21 U.S.C. section 360bbb-3(b)(1), unless the authorization is terminated or revoked sooner.    Influenza A by PCR NEGATIVE NEGATIVE Final   Influenza B by PCR NEGATIVE NEGATIVE Final    Comment: (NOTE) The Xpert Xpress SARS-CoV-2/FLU/RSV assay is intended as an aid in  the diagnosis of influenza from Nasopharyngeal swab specimens and  should not be used as a sole basis for treatment. Nasal washings and  aspirates are unacceptable for Xpert Xpress SARS-CoV-2/FLU/RSV  testing. Fact Sheet for Patients: PinkCheek.be Fact Sheet for Healthcare Providers: GravelBags.it This test is not yet approved or cleared by the [place] FDA and  has been authorized for detection and/or diagnosis of SARS-CoV-2 by  FDA under an Emergency Use Authorization (EUA). This EUA will remain  in effect (meaning this test can be used) for the duration of the  Covid-19 declaration under Section 564(b)(1) of the Act, 21  U.S.C. section 360bbb-3(b)(1), unless the authorization is  terminated or revoked. Performed at [facility], [address] [place], [place] [address]   Blood Culture (routine x 2)     Status: None (Preliminary result)   Collection Time: 08/08/19  9:50 AM   Specimen: BLOOD  Result Value Ref Range Status  Specimen Description BLOOD BLOOD LEFT FOREARM  Final   Special Requests   Final    BOTTLES DRAWN AEROBIC AND ANAEROBIC Blood Culture adequate volume   Culture   Final    NO GROWTH 2 DAYS Performed at [facility], [address] [place], [place] [address]    Report Status PENDING  Incomplete  Blood Culture (routine x 2)     Status: None (Preliminary result)   Collection Time: 08/08/19  9:50 AM   Specimen: BLOOD  Result Value Ref Range Status   Specimen Description BLOOD BLOOD LEFT ARM  Final   Special Requests    Final    BOTTLES DRAWN AEROBIC AND ANAEROBIC Blood Culture results may not be optimal due to an excessive volume of blood received in culture bottles   Culture   Final    NO GROWTH 2 DAYS Performed at [facility], [address], [place], [place] [address]    Report Status PENDING  Incomplete     Labs: BNP (last 3 results) No results for input(s): BNP in the last 8760 hours. Basic Metabolic Panel: Recent Labs  Lab 08/08/19 0950 08/09/19 1040 08/10/19 0538  NA 137  --  140  K 5.5*  --  4.4  CL 100  --  100  CO2 21*  --  28  GLUCOSE 113*  --  101*  BUN 54*  --  50*  CREATININE 12.07*  --  11.12*  CALCIUM 8.7*  --  8.0*  PHOS  --  3.7  --    Liver Function Tests: Recent Labs  Lab 08/08/19 0950  AST 21  ALT 21  ALKPHOS 63  BILITOT 1.0  PROT 8.0  ALBUMIN 4.2   No results for input(s): LIPASE, AMYLASE in the last 168 hours. No results for input(s): AMMONIA in the last 168 hours. CBC: Recent Labs  Lab 08/08/19 0950 08/10/19 0538  WBC 9.8 4.6  NEUTROABS 8.6*  --   HGB 12.0* 10.9*  HCT 37.0* 34.3*  MCV 90.5 93.0  PLT 143* 126*   Cardiac Enzymes: No results for input(s): CKTOTAL, CKMB, CKMBINDEX, TROPONINI in the last 168 hours. BNP: Invalid input(s): POCBNP CBG: No results for input(s): GLUCAP in the last 168 hours. D-Dimer No results for input(s): DDIMER in the last 72 hours. Hgb A1c No results for input(s): HGBA1C in the last 72 hours. Lipid Profile No results for input(s): CHOL, HDL, LDLCALC, TRIG, CHOLHDL, LDLDIRECT in the last 72 hours. Thyroid function studies Recent Labs    08/08/19 0950  TSH 2.932   Anemia work up No results for input(s): VITAMINB12, FOLATE, FERRITIN, TIBC, IRON, RETICCTPCT in the last 72 hours. Urinalysis No results found for: COLORURINE, APPEARANCEUR, [place], [place], [place], [place], [place], [place], PROTEINUR, UROBILINOGEN, NITRITE, LEUKOCYTESUR Sepsis Labs Invalid input(s): PROCALCITONIN,  WBC,   LACTICIDVEN Microbiology Recent Results (from the past 240 hour(s))  Respiratory Panel by RT PCR (Flu A&B, Covid) - Nasopharyngeal Swab     Status: None   Collection Time: 08/08/19  9:08 AM   Specimen: Nasopharyngeal Swab  Result Value Ref Range Status   SARS Coronavirus 2 by RT PCR NEGATIVE NEGATIVE Final    Comment: (NOTE) SARS-CoV-2 target nucleic acids are NOT DETECTED. The SARS-CoV-2 RNA is generally detectable in upper respiratoy specimens during the acute phase of infection. The lowest concentration of SARS-CoV-2 viral copies this assay can detect is 131 copies/mL. A negative result does not preclude SARS-Cov-2 infection and should not be used as the sole basis  for treatment or other patient management decisions. A negative result may occur with  improper specimen collection/handling, submission of specimen other than nasopharyngeal swab, presence of viral mutation(s) within the areas targeted by this assay, and inadequate number of viral copies (<131 copies/mL). A negative result must be combined with clinical observations, patient history, and epidemiological information. The expected result is Negative. Fact Sheet for Patients:  PinkCheek.be Fact Sheet for Healthcare Providers:  GravelBags.it This test is not yet ap proved or cleared by the [place] FDA and  has been authorized for detection and/or diagnosis of SARS-CoV-2 by FDA under an Emergency Use Authorization (EUA). This EUA will remain  in effect (meaning this test can be used) for the duration of the COVID-19 declaration under Section 564(b)(1) of the Act, 21 U.S.C. section 360bbb-3(b)(1), unless the authorization is terminated or revoked sooner.    Influenza A by PCR NEGATIVE NEGATIVE Final   Influenza B by PCR NEGATIVE NEGATIVE Final    Comment: (NOTE) The Xpert Xpress SARS-CoV-2/FLU/RSV assay is intended as an aid in  the diagnosis of influenza  from Nasopharyngeal swab specimens and  should not be used as a sole basis for treatment. Nasal washings and  aspirates are unacceptable for Xpert Xpress SARS-CoV-2/FLU/RSV  testing. Fact Sheet for Patients: PinkCheek.be Fact Sheet for Healthcare Providers: GravelBags.it This test is not yet approved or cleared by the [place] FDA and  has been authorized for detection and/or diagnosis of SARS-CoV-2 by  FDA under an Emergency Use Authorization (EUA). This EUA will remain  in effect (meaning this test can be used) for the duration of the  Covid-19 declaration under Section 564(b)(1) of the Act, 21  U.S.C. section 360bbb-3(b)(1), unless the authorization is  terminated or revoked. Performed at [facility], [address] [place], [place] [address]   Blood Culture (routine x 2)     Status: None (Preliminary result)   Collection Time: 08/08/19  9:50 AM   Specimen: BLOOD  Result Value Ref Range Status   Specimen Description BLOOD BLOOD LEFT FOREARM  Final   Special Requests   Final    BOTTLES DRAWN AEROBIC AND ANAEROBIC Blood Culture adequate volume   Culture   Final    NO GROWTH 2 DAYS Performed at [facility], [address], [place], [place] [address]    Report Status PENDING  Incomplete  Blood Culture (routine x 2)     Status: None (Preliminary result)   Collection Time: 08/08/19  9:50 AM   Specimen: BLOOD  Result Value Ref Range Status   Specimen Description BLOOD BLOOD LEFT ARM  Final   Special Requests   Final    BOTTLES DRAWN AEROBIC AND ANAEROBIC Blood Culture results may not be optimal due to an excessive volume of blood received in culture bottles   Culture   Final    NO GROWTH 2 DAYS Performed at [facility], [address], [place], [place] [address]    Report Status PENDING  Incomplete     Time coordinating discharge: 35 minutes  SIGNED:   Louellen Molder,  MD  [facility] 08/10/2019, 11:04 AM Pager   If 7PM-7AM, please contact night-coverage [email or web address] Password [email or web address]

## 2019-08-12 DIAGNOSIS — Z992 Dependence on renal dialysis: Secondary | ICD-10-CM | POA: Diagnosis not present

## 2019-08-12 DIAGNOSIS — N2581 Secondary hyperparathyroidism of renal origin: Secondary | ICD-10-CM | POA: Diagnosis not present

## 2019-08-12 DIAGNOSIS — N186 End stage renal disease: Secondary | ICD-10-CM | POA: Diagnosis not present

## 2019-08-13 LAB — CULTURE, BLOOD (ROUTINE X 2)
Culture: NO GROWTH
Culture: NO GROWTH
Special Requests: ADEQUATE

## 2019-08-16 DIAGNOSIS — N186 End stage renal disease: Secondary | ICD-10-CM | POA: Diagnosis not present

## 2019-08-16 DIAGNOSIS — N2581 Secondary hyperparathyroidism of renal origin: Secondary | ICD-10-CM | POA: Diagnosis not present

## 2019-08-16 DIAGNOSIS — Z992 Dependence on renal dialysis: Secondary | ICD-10-CM | POA: Diagnosis not present

## 2019-08-19 DIAGNOSIS — Z992 Dependence on renal dialysis: Secondary | ICD-10-CM | POA: Diagnosis not present

## 2019-08-19 DIAGNOSIS — N186 End stage renal disease: Secondary | ICD-10-CM | POA: Diagnosis not present

## 2019-08-19 DIAGNOSIS — N2581 Secondary hyperparathyroidism of renal origin: Secondary | ICD-10-CM | POA: Diagnosis not present

## 2019-08-21 DIAGNOSIS — N186 End stage renal disease: Secondary | ICD-10-CM | POA: Diagnosis not present

## 2019-08-21 DIAGNOSIS — N2581 Secondary hyperparathyroidism of renal origin: Secondary | ICD-10-CM | POA: Diagnosis not present

## 2019-08-21 DIAGNOSIS — Z992 Dependence on renal dialysis: Secondary | ICD-10-CM | POA: Diagnosis not present

## 2019-08-22 ENCOUNTER — Ambulatory Visit (INDEPENDENT_AMBULATORY_CARE_PROVIDER_SITE_OTHER): Payer: Medicare Other | Admitting: Vascular Surgery

## 2019-08-22 ENCOUNTER — Encounter (INDEPENDENT_AMBULATORY_CARE_PROVIDER_SITE_OTHER): Payer: Medicare Other

## 2019-08-22 DIAGNOSIS — N051 Unspecified nephritic syndrome with focal and segmental glomerular lesions: Secondary | ICD-10-CM | POA: Diagnosis not present

## 2019-08-22 DIAGNOSIS — N186 End stage renal disease: Secondary | ICD-10-CM | POA: Diagnosis not present

## 2019-08-22 DIAGNOSIS — Z992 Dependence on renal dialysis: Secondary | ICD-10-CM | POA: Diagnosis not present

## 2019-08-23 DIAGNOSIS — Z992 Dependence on renal dialysis: Secondary | ICD-10-CM | POA: Diagnosis not present

## 2019-08-23 DIAGNOSIS — N186 End stage renal disease: Secondary | ICD-10-CM | POA: Diagnosis not present

## 2019-08-23 DIAGNOSIS — N2581 Secondary hyperparathyroidism of renal origin: Secondary | ICD-10-CM | POA: Diagnosis not present

## 2019-08-26 DIAGNOSIS — Z992 Dependence on renal dialysis: Secondary | ICD-10-CM | POA: Diagnosis not present

## 2019-08-26 DIAGNOSIS — N186 End stage renal disease: Secondary | ICD-10-CM | POA: Diagnosis not present

## 2019-08-26 DIAGNOSIS — N2581 Secondary hyperparathyroidism of renal origin: Secondary | ICD-10-CM | POA: Diagnosis not present

## 2019-08-28 DIAGNOSIS — N2581 Secondary hyperparathyroidism of renal origin: Secondary | ICD-10-CM | POA: Diagnosis not present

## 2019-08-28 DIAGNOSIS — N186 End stage renal disease: Secondary | ICD-10-CM | POA: Diagnosis not present

## 2019-08-28 DIAGNOSIS — Z992 Dependence on renal dialysis: Secondary | ICD-10-CM | POA: Diagnosis not present

## 2019-09-02 DIAGNOSIS — Z992 Dependence on renal dialysis: Secondary | ICD-10-CM | POA: Diagnosis not present

## 2019-09-02 DIAGNOSIS — N186 End stage renal disease: Secondary | ICD-10-CM | POA: Diagnosis not present

## 2019-09-02 DIAGNOSIS — N2581 Secondary hyperparathyroidism of renal origin: Secondary | ICD-10-CM | POA: Diagnosis not present

## 2019-09-04 DIAGNOSIS — N2581 Secondary hyperparathyroidism of renal origin: Secondary | ICD-10-CM | POA: Diagnosis not present

## 2019-09-04 DIAGNOSIS — N186 End stage renal disease: Secondary | ICD-10-CM | POA: Diagnosis not present

## 2019-09-04 DIAGNOSIS — Z992 Dependence on renal dialysis: Secondary | ICD-10-CM | POA: Diagnosis not present

## 2019-09-06 DIAGNOSIS — Z992 Dependence on renal dialysis: Secondary | ICD-10-CM | POA: Diagnosis not present

## 2019-09-06 DIAGNOSIS — N186 End stage renal disease: Secondary | ICD-10-CM | POA: Diagnosis not present

## 2019-09-06 DIAGNOSIS — N2581 Secondary hyperparathyroidism of renal origin: Secondary | ICD-10-CM | POA: Diagnosis not present

## 2019-09-09 ENCOUNTER — Other Ambulatory Visit (INDEPENDENT_AMBULATORY_CARE_PROVIDER_SITE_OTHER): Payer: Self-pay | Admitting: Nurse Practitioner

## 2019-09-09 DIAGNOSIS — N186 End stage renal disease: Secondary | ICD-10-CM | POA: Diagnosis not present

## 2019-09-09 DIAGNOSIS — Z992 Dependence on renal dialysis: Secondary | ICD-10-CM | POA: Diagnosis not present

## 2019-09-09 DIAGNOSIS — N2581 Secondary hyperparathyroidism of renal origin: Secondary | ICD-10-CM | POA: Diagnosis not present

## 2019-09-11 DIAGNOSIS — Z992 Dependence on renal dialysis: Secondary | ICD-10-CM | POA: Diagnosis not present

## 2019-09-11 DIAGNOSIS — N2581 Secondary hyperparathyroidism of renal origin: Secondary | ICD-10-CM | POA: Diagnosis not present

## 2019-09-11 DIAGNOSIS — N186 End stage renal disease: Secondary | ICD-10-CM | POA: Diagnosis not present

## 2019-09-12 ENCOUNTER — Other Ambulatory Visit (INDEPENDENT_AMBULATORY_CARE_PROVIDER_SITE_OTHER): Payer: Self-pay | Admitting: Nurse Practitioner

## 2019-09-12 ENCOUNTER — Ambulatory Visit (INDEPENDENT_AMBULATORY_CARE_PROVIDER_SITE_OTHER): Payer: Medicare HMO

## 2019-09-12 ENCOUNTER — Other Ambulatory Visit: Payer: Self-pay

## 2019-09-12 ENCOUNTER — Other Ambulatory Visit (INDEPENDENT_AMBULATORY_CARE_PROVIDER_SITE_OTHER): Payer: Self-pay | Admitting: Vascular Surgery

## 2019-09-12 ENCOUNTER — Ambulatory Visit (INDEPENDENT_AMBULATORY_CARE_PROVIDER_SITE_OTHER): Payer: Medicare HMO | Admitting: Nurse Practitioner

## 2019-09-12 ENCOUNTER — Encounter (INDEPENDENT_AMBULATORY_CARE_PROVIDER_SITE_OTHER): Payer: Self-pay | Admitting: Nurse Practitioner

## 2019-09-12 VITALS — BP 112/72 | HR 74 | Resp 16 | Wt 269.6 lb

## 2019-09-12 DIAGNOSIS — N186 End stage renal disease: Secondary | ICD-10-CM

## 2019-09-12 DIAGNOSIS — Z992 Dependence on renal dialysis: Secondary | ICD-10-CM | POA: Diagnosis not present

## 2019-09-12 DIAGNOSIS — I10 Essential (primary) hypertension: Secondary | ICD-10-CM

## 2019-09-12 DIAGNOSIS — E1142 Type 2 diabetes mellitus with diabetic polyneuropathy: Secondary | ICD-10-CM

## 2019-09-12 NOTE — Progress Notes (Signed)
Subjective:    Patient ID: Patrick Downs, male    DOB: 1975/05/27, 44 y.o.   MRN: [id number] Chief Complaint  Patient presents with  . Follow-up    ultrasound follow up    The patient presents today originally as a 44-month HDA follow-up however the patient has had his access occluded for about 5 months now, since about Christmas.  The patient has had a history of multiple failed accesses.  The patient is currently maintained via PermCath.  He denies any issues with it currently.  He denies any fever, chills, nausea, vomiting or diarrhea.   The patient denies hand pain or other symptoms consistent with steal phenomena.  No significant arm swelling.  The patient also does have neuropathy in his bilateral upper extremities.  The patient denies redness or swelling at the access site. The patient denies fever or chills at home or while on dialysis.  Today the patient underwent a bilateral upper extremity vein mapping.  Based on these studies the patient may have possible access for a left brachial axillary graft.     Review of Systems  All other systems reviewed and are negative.      Objective:   Physical Exam Vitals reviewed.  Cardiovascular:     Comments: Multiple failed dialysis accesses bilateral upper extremities Pulmonary:     Effort: Pulmonary effort is normal.     Breath sounds: Normal breath sounds.  Musculoskeletal:        General: Normal range of motion.  Skin:    General: Skin is warm.  Neurological:     Mental Status: He is alert and oriented to person, place, and time.  Psychiatric:        Mood and Affect: Mood normal.        Behavior: Behavior normal.        Thought Content: Thought content normal.        Judgment: Judgment normal.     BP 112/72 (BP Location: Left Arm)   Pulse 74   Resp 16   Wt 269 lb 9.6 oz (122.3 kg)   BMI 36.56 kg/m   Past Medical History:  Diagnosis Date  . Depression   . Dialysis patient ([place])   . ESRD (end stage renal  disease) on dialysis ([place])   . Failure to thrive in adult   . Gout   . Guillain Barr syndrome ([place])   . HTN (hypertension)   . Kidney failure   . Pneumonia   . Renal insufficiency   . Respiratory failure ([place])     Social History   Socioeconomic History  . Marital status: Single    Spouse name: Not on file  . Number of children: Not on file  . Years of education: Not on file  . Highest education level: Not on file  Occupational History  . Not on file  Tobacco Use  . Smoking status: Never Smoker  . Smokeless tobacco: Never Used  Substance and Sexual Activity  . Alcohol use: No  . Drug use: Yes    Types: Marijuana    Comment: last time was June  . Sexual activity: Not on file    Comment: did not ask, mother present  Other Topics Concern  . Not on file  Social History Narrative  . Not on file   Social Determinants of Health   Financial Resource Strain:   . Difficulty of Paying Living Expenses:   Food Insecurity:   . Worried About [employer] of  Food in the Last Year:   . [place] in the Last Year:   Transportation Needs:   . Film/video editor (Medical):   Marland Kitchen Lack of Transportation (Non-Medical):   Physical Activity:   . Days of Exercise per Week:   . Minutes of Exercise per Session:   Stress:   . Feeling of Stress :   Social Connections:   . Frequency of Communication with Friends and Family:   . Frequency of Social Gatherings with Friends and Family:   . Attends Religious Services:   . Active Member of Clubs or Organizations:   . Attends Archivist Meetings:   Marland Kitchen Marital Status:   Intimate Partner Violence:   . Fear of Current or Ex-Partner:   . Emotionally Abused:   Marland Kitchen Physically Abused:   . Sexually Abused:     Past Surgical History:  Procedure Laterality Date  . A/V FISTULAGRAM Right 01/07/2018   Procedure: A/V FISTULAGRAM;  Surgeon: Algernon Huxley, MD;  Location: [facility];  Service: Cardiovascular;  Laterality: Right;   . AMPUTATION TOE Right 06/08/2017   Procedure: AMPUTATION TOE RIGHT FIFTH TOE;  Surgeon: Samara Deist, DPM;  Location: [facility];  Service: Podiatry;  Laterality: Right;  . AMPUTATION TOE Left 05/17/2018   Procedure: RAY LEFT 5TH;  Surgeon: Samara Deist, DPM;  Location: [facility];  Service: Podiatry;  Laterality: Left;  . AV FISTULA PLACEMENT     x5      2 graphs  . DIALYSIS/PERMA CATHETER INSERTION N/A 01/20/2019   Procedure: DIALYSIS/PERMA CATHETER INSERTION;  Surgeon: Algernon Huxley, MD;  Location: [facility];  Service: Cardiovascular;  Laterality: N/A;  . DIALYSIS/PERMA CATHETER REMOVAL N/A 06/17/2018   Procedure: DIALYSIS/PERMA CATHETER REMOVAL;  Surgeon: Algernon Huxley, MD;  Location: [facility] [facility];  Service: Cardiovascular;  Laterality: N/A;  . PARATHYROIDECTOMY    . PERIPHERAL VASCULAR THROMBECTOMY Right 01/22/2019   Procedure: PERIPHERAL VASCULAR THROMBECTOMY;  Surgeon: Algernon Huxley, MD;  Location: [facility];  Service: Cardiovascular;  Laterality: Right;  . RENAL BIOPSY    . REVISON OF ARTERIOVENOUS FISTULA Right 02/07/2018   Procedure: REVISON OF ARTERIOVENOUS FISTULA;  Surgeon: Algernon Huxley, MD;  Location: [facility];  Service: Vascular;  Laterality: Right;  . TEE WITHOUT CARDIOVERSION N/A 01/22/2018   Procedure: TRANSESOPHAGEAL ECHOCARDIOGRAM (TEE);  Surgeon: Dionisio David, MD;  Location: [facility];  Service: Cardiovascular;  Laterality: N/A;  . tonsiilectomy    . tracheotomy      Family History  Problem Relation Age of Onset  . Diabetes Mellitus II Father   . Kidney disease Father   . Kidney failure Paternal Grandfather   . Prostate cancer Neg Hx   . Kidney cancer Neg Hx   . Bladder Cancer Neg Hx     Allergies  Allergen Reactions  . Ondansetron Other (See Comments)    Stomach pain   . Minoxidil Other (See Comments)    "put fluid around my heart", PERICARDIAL EFFUSION  . Morphine And Related Other (See Comments)    Aggressive   . Omnipaque  [Iohexol] Itching and Other (See Comments)    Rigors on one occasion, widespread itching on a separate occasion (resolved with Benadryl), tremors       Assessment & Plan:   1. ESRD on dialysis [facility]) The patient has had multiple upper extremity accesses with much difficulty.  The patient has not had good luck with grafts in general as  they either thrombosed or become infected any resection.  However, due to his multiple accesses, a native fistula may not be possible.  We also discussed the possibility of a hero graft as that may perform better than a standard graft.  We also discussed leg access however the patient's ultimate goal is to avoid leg access as much as possible due to the fact that he works on his legs works dented.  It is afraid that that may cause difficulties with him doing so.  In order for [place] to fully evaluate the best possible access for the patient, a bilateral upper extremity venogram will be necessary.  I have discussed the procedure as well as the alternatives, benefits and risk and the patient agrees to proceed.  We will have the patient follow-up in office following his procedure.  2. Essential hypertension Continue antihypertensive medications as already ordered, these medications have been reviewed and there are no changes at this time.   3. Type 2 diabetes mellitus with diabetic polyneuropathy, without long-term current use of insulin (HCC) Continue hypoglycemic medications as already ordered, these medications have been reviewed and there are no changes at this time.  Hgb A1C to be monitored as already arranged by primary service    Current Outpatient Medications on File Prior to Visit  Medication Sig Dispense Refill  . acetaminophen (TYLENOL) 500 MG tablet Take 1,000-1,500 mg by mouth 2 (two) times daily as needed for moderate pain or headache.    . calcium acetate (PHOSLO) 667 MG capsule Take 3 capsules (2,001 mg total) by mouth 3 (three) times daily with meals.  (Patient taking differently: Take 1,334-2,001 mg by mouth 3 (three) times daily with meals. ) 1 capsule 0  . gabapentin (NEURONTIN) 300 MG capsule Take 600 mg by mouth at bedtime.     . midodrine (PROAMATINE) 5 MG tablet Take 5 mg by mouth See admin instructions. Take 2 tablets (10 mg) by mouth before dialysis on Tuesdays, Thursdays, & Saturday; Take 1 tablet (5 mg) by mouth in the evening on  Tuesdays, Thursdays, & Saturdays. On non-dialysis days take 1 tablet (5 mg) by mouth every 8 hours.    Marland Kitchen oxyCODONE-acetaminophen (PERCOCET) 10-325 MG tablet Take 1 tablet by mouth every 6 (six) hours as needed for pain. 120 tablet 0  . sildenafil (REVATIO) 20 MG tablet Take 20-100 mg by mouth as needed (for ED).      No current facility-administered medications on file prior to visit.    There are no Patient Instructions on file for this visit. No follow-ups on file.   Kris Hartmann, NP

## 2019-09-16 ENCOUNTER — Telehealth (INDEPENDENT_AMBULATORY_CARE_PROVIDER_SITE_OTHER): Payer: Self-pay

## 2019-09-16 DIAGNOSIS — Z992 Dependence on renal dialysis: Secondary | ICD-10-CM | POA: Diagnosis not present

## 2019-09-16 DIAGNOSIS — N2581 Secondary hyperparathyroidism of renal origin: Secondary | ICD-10-CM | POA: Diagnosis not present

## 2019-09-16 DIAGNOSIS — N186 End stage renal disease: Secondary | ICD-10-CM | POA: Diagnosis not present

## 2019-09-16 NOTE — Telephone Encounter (Signed)
I attempted to contact the patient to schedule him for a bilateral venogram and a message was left for a return call. Both numbers in the system were called.

## 2019-09-18 DIAGNOSIS — N186 End stage renal disease: Secondary | ICD-10-CM | POA: Diagnosis not present

## 2019-09-18 DIAGNOSIS — N2581 Secondary hyperparathyroidism of renal origin: Secondary | ICD-10-CM | POA: Diagnosis not present

## 2019-09-18 DIAGNOSIS — Z992 Dependence on renal dialysis: Secondary | ICD-10-CM | POA: Diagnosis not present

## 2019-09-20 DIAGNOSIS — Z992 Dependence on renal dialysis: Secondary | ICD-10-CM | POA: Diagnosis not present

## 2019-09-20 DIAGNOSIS — N186 End stage renal disease: Secondary | ICD-10-CM | POA: Diagnosis not present

## 2019-09-20 DIAGNOSIS — N2581 Secondary hyperparathyroidism of renal origin: Secondary | ICD-10-CM | POA: Diagnosis not present

## 2019-09-22 DIAGNOSIS — N186 End stage renal disease: Secondary | ICD-10-CM | POA: Diagnosis not present

## 2019-09-22 DIAGNOSIS — Z992 Dependence on renal dialysis: Secondary | ICD-10-CM | POA: Diagnosis not present

## 2019-09-22 DIAGNOSIS — N051 Unspecified nephritic syndrome with focal and segmental glomerular lesions: Secondary | ICD-10-CM | POA: Diagnosis not present

## 2019-09-23 DIAGNOSIS — N2581 Secondary hyperparathyroidism of renal origin: Secondary | ICD-10-CM | POA: Diagnosis not present

## 2019-09-23 DIAGNOSIS — Z992 Dependence on renal dialysis: Secondary | ICD-10-CM | POA: Diagnosis not present

## 2019-09-23 DIAGNOSIS — N186 End stage renal disease: Secondary | ICD-10-CM | POA: Diagnosis not present

## 2019-09-25 ENCOUNTER — Telehealth (INDEPENDENT_AMBULATORY_CARE_PROVIDER_SITE_OTHER): Payer: Self-pay

## 2019-09-25 NOTE — Telephone Encounter (Signed)
Spoke with the patient and he is scheduled with Dr. Lucky Cowboy for bilateral venograms on 10/01/19 with a 7:45 am arrival time to the [facility]. Patient will do covid testing on 09/29/19 between 8-1 pm at the [facility]. Pre-procedure instructions were discussed and will be mailed.

## 2019-09-27 DIAGNOSIS — N2581 Secondary hyperparathyroidism of renal origin: Secondary | ICD-10-CM | POA: Diagnosis not present

## 2019-09-27 DIAGNOSIS — Z992 Dependence on renal dialysis: Secondary | ICD-10-CM | POA: Diagnosis not present

## 2019-09-27 DIAGNOSIS — N186 End stage renal disease: Secondary | ICD-10-CM | POA: Diagnosis not present

## 2019-09-30 ENCOUNTER — Other Ambulatory Visit
Admission: RE | Admit: 2019-09-30 | Discharge: 2019-09-30 | Disposition: A | Discharge status: Home / Self Care | Payer: Medicare HMO | Source: Ambulatory Visit | Attending: Vascular Surgery | Admitting: Vascular Surgery

## 2019-09-30 ENCOUNTER — Other Ambulatory Visit (INDEPENDENT_AMBULATORY_CARE_PROVIDER_SITE_OTHER): Payer: Self-pay | Admitting: Nurse Practitioner

## 2019-09-30 ENCOUNTER — Other Ambulatory Visit: Payer: Self-pay

## 2019-09-30 DIAGNOSIS — N2581 Secondary hyperparathyroidism of renal origin: Secondary | ICD-10-CM | POA: Diagnosis not present

## 2019-09-30 DIAGNOSIS — Z20822 Contact with and (suspected) exposure to covid-19: Secondary | ICD-10-CM | POA: Diagnosis not present

## 2019-09-30 DIAGNOSIS — Z992 Dependence on renal dialysis: Secondary | ICD-10-CM | POA: Diagnosis not present

## 2019-09-30 DIAGNOSIS — Z01812 Encounter for preprocedural laboratory examination: Secondary | ICD-10-CM | POA: Diagnosis not present

## 2019-09-30 DIAGNOSIS — N186 End stage renal disease: Secondary | ICD-10-CM | POA: Diagnosis not present

## 2019-10-01 ENCOUNTER — Encounter: Admission: RE | Payer: Self-pay | Source: Home / Self Care

## 2019-10-01 ENCOUNTER — Ambulatory Visit: Admission: RE | Admit: 2019-10-01 | Payer: Medicare HMO | Source: Home / Self Care | Admitting: Vascular Surgery

## 2019-10-01 LAB — SARS CORONAVIRUS 2 (TAT 6-24 HRS): SARS Coronavirus 2: NEGATIVE

## 2019-10-01 SURGERY — UPPER EXTREMITY VENOGRAPHY
Anesthesia: Moderate Sedation | Laterality: Bilateral

## 2019-10-02 DIAGNOSIS — N2581 Secondary hyperparathyroidism of renal origin: Secondary | ICD-10-CM | POA: Diagnosis not present

## 2019-10-02 DIAGNOSIS — N186 End stage renal disease: Secondary | ICD-10-CM | POA: Diagnosis not present

## 2019-10-02 DIAGNOSIS — Z992 Dependence on renal dialysis: Secondary | ICD-10-CM | POA: Diagnosis not present

## 2019-10-04 DIAGNOSIS — N2581 Secondary hyperparathyroidism of renal origin: Secondary | ICD-10-CM | POA: Diagnosis not present

## 2019-10-04 DIAGNOSIS — Z992 Dependence on renal dialysis: Secondary | ICD-10-CM | POA: Diagnosis not present

## 2019-10-04 DIAGNOSIS — N186 End stage renal disease: Secondary | ICD-10-CM | POA: Diagnosis not present

## 2019-10-07 DIAGNOSIS — Z992 Dependence on renal dialysis: Secondary | ICD-10-CM | POA: Diagnosis not present

## 2019-10-07 DIAGNOSIS — N186 End stage renal disease: Secondary | ICD-10-CM | POA: Diagnosis not present

## 2019-10-07 DIAGNOSIS — N2581 Secondary hyperparathyroidism of renal origin: Secondary | ICD-10-CM | POA: Diagnosis not present

## 2019-10-09 DIAGNOSIS — N186 End stage renal disease: Secondary | ICD-10-CM | POA: Diagnosis not present

## 2019-10-09 DIAGNOSIS — Z992 Dependence on renal dialysis: Secondary | ICD-10-CM | POA: Diagnosis not present

## 2019-10-09 DIAGNOSIS — N2581 Secondary hyperparathyroidism of renal origin: Secondary | ICD-10-CM | POA: Diagnosis not present

## 2019-10-11 DIAGNOSIS — Z992 Dependence on renal dialysis: Secondary | ICD-10-CM | POA: Diagnosis not present

## 2019-10-11 DIAGNOSIS — N186 End stage renal disease: Secondary | ICD-10-CM | POA: Diagnosis not present

## 2019-10-11 DIAGNOSIS — N2581 Secondary hyperparathyroidism of renal origin: Secondary | ICD-10-CM | POA: Diagnosis not present

## 2019-10-16 DIAGNOSIS — N186 End stage renal disease: Secondary | ICD-10-CM | POA: Diagnosis not present

## 2019-10-16 DIAGNOSIS — Z992 Dependence on renal dialysis: Secondary | ICD-10-CM | POA: Diagnosis not present

## 2019-10-16 DIAGNOSIS — N2581 Secondary hyperparathyroidism of renal origin: Secondary | ICD-10-CM | POA: Diagnosis not present

## 2019-10-18 DIAGNOSIS — N2581 Secondary hyperparathyroidism of renal origin: Secondary | ICD-10-CM | POA: Diagnosis not present

## 2019-10-18 DIAGNOSIS — Z992 Dependence on renal dialysis: Secondary | ICD-10-CM | POA: Diagnosis not present

## 2019-10-18 DIAGNOSIS — N186 End stage renal disease: Secondary | ICD-10-CM | POA: Diagnosis not present

## 2019-10-21 DIAGNOSIS — Z992 Dependence on renal dialysis: Secondary | ICD-10-CM | POA: Diagnosis not present

## 2019-10-21 DIAGNOSIS — N186 End stage renal disease: Secondary | ICD-10-CM | POA: Diagnosis not present

## 2019-10-21 DIAGNOSIS — N2581 Secondary hyperparathyroidism of renal origin: Secondary | ICD-10-CM | POA: Diagnosis not present

## 2019-10-22 DIAGNOSIS — N051 Unspecified nephritic syndrome with focal and segmental glomerular lesions: Secondary | ICD-10-CM | POA: Diagnosis not present

## 2019-10-22 DIAGNOSIS — Z992 Dependence on renal dialysis: Secondary | ICD-10-CM | POA: Diagnosis not present

## 2019-10-22 DIAGNOSIS — N186 End stage renal disease: Secondary | ICD-10-CM | POA: Diagnosis not present

## 2019-10-23 DIAGNOSIS — N186 End stage renal disease: Secondary | ICD-10-CM | POA: Diagnosis not present

## 2019-10-23 DIAGNOSIS — Z992 Dependence on renal dialysis: Secondary | ICD-10-CM | POA: Diagnosis not present

## 2019-10-23 DIAGNOSIS — N2581 Secondary hyperparathyroidism of renal origin: Secondary | ICD-10-CM | POA: Diagnosis not present

## 2019-10-28 DIAGNOSIS — N2581 Secondary hyperparathyroidism of renal origin: Secondary | ICD-10-CM | POA: Diagnosis not present

## 2019-10-28 DIAGNOSIS — Z992 Dependence on renal dialysis: Secondary | ICD-10-CM | POA: Diagnosis not present

## 2019-10-28 DIAGNOSIS — N186 End stage renal disease: Secondary | ICD-10-CM | POA: Diagnosis not present

## 2019-11-01 DIAGNOSIS — Z992 Dependence on renal dialysis: Secondary | ICD-10-CM | POA: Diagnosis not present

## 2019-11-01 DIAGNOSIS — N2581 Secondary hyperparathyroidism of renal origin: Secondary | ICD-10-CM | POA: Diagnosis not present

## 2019-11-01 DIAGNOSIS — N186 End stage renal disease: Secondary | ICD-10-CM | POA: Diagnosis not present

## 2019-11-04 DIAGNOSIS — N186 End stage renal disease: Secondary | ICD-10-CM | POA: Diagnosis not present

## 2019-11-04 DIAGNOSIS — Z992 Dependence on renal dialysis: Secondary | ICD-10-CM | POA: Diagnosis not present

## 2019-11-04 DIAGNOSIS — N2581 Secondary hyperparathyroidism of renal origin: Secondary | ICD-10-CM | POA: Diagnosis not present

## 2019-11-06 DIAGNOSIS — N186 End stage renal disease: Secondary | ICD-10-CM | POA: Diagnosis not present

## 2019-11-06 DIAGNOSIS — N2581 Secondary hyperparathyroidism of renal origin: Secondary | ICD-10-CM | POA: Diagnosis not present

## 2019-11-06 DIAGNOSIS — Z992 Dependence on renal dialysis: Secondary | ICD-10-CM | POA: Diagnosis not present

## 2019-11-10 ENCOUNTER — Other Ambulatory Visit: Payer: Self-pay

## 2019-11-10 ENCOUNTER — Ambulatory Visit: Payer: Medicare HMO | Attending: Anesthesiology | Admitting: Anesthesiology

## 2019-11-10 ENCOUNTER — Encounter: Payer: Self-pay | Admitting: Anesthesiology

## 2019-11-10 DIAGNOSIS — M545 Low back pain, unspecified: Secondary | ICD-10-CM

## 2019-11-10 DIAGNOSIS — G610 Guillain-Barre syndrome: Secondary | ICD-10-CM | POA: Diagnosis not present

## 2019-11-10 DIAGNOSIS — Z992 Dependence on renal dialysis: Secondary | ICD-10-CM | POA: Diagnosis not present

## 2019-11-10 DIAGNOSIS — M47816 Spondylosis without myelopathy or radiculopathy, lumbar region: Secondary | ICD-10-CM

## 2019-11-10 DIAGNOSIS — F1190 Opioid use, unspecified, uncomplicated: Secondary | ICD-10-CM | POA: Diagnosis not present

## 2019-11-10 DIAGNOSIS — G8929 Other chronic pain: Secondary | ICD-10-CM

## 2019-11-10 DIAGNOSIS — M5136 Other intervertebral disc degeneration, lumbar region: Secondary | ICD-10-CM

## 2019-11-10 DIAGNOSIS — G894 Chronic pain syndrome: Secondary | ICD-10-CM | POA: Diagnosis not present

## 2019-11-10 DIAGNOSIS — R29898 Other symptoms and signs involving the musculoskeletal system: Secondary | ICD-10-CM

## 2019-11-10 DIAGNOSIS — N186 End stage renal disease: Secondary | ICD-10-CM

## 2019-11-10 DIAGNOSIS — N2581 Secondary hyperparathyroidism of renal origin: Secondary | ICD-10-CM | POA: Diagnosis not present

## 2019-11-10 DIAGNOSIS — M51369 Other intervertebral disc degeneration, lumbar region without mention of lumbar back pain or lower extremity pain: Secondary | ICD-10-CM

## 2019-11-10 MED ORDER — OXYCODONE-ACETAMINOPHEN 10-325 MG PO TABS: 1.0000 | ORAL_TABLET | Freq: Four times a day (QID) | ORAL | 0 refills | Status: AC | PRN

## 2019-11-10 NOTE — Progress Notes (Signed)
Virtual Visit via Telephone Note  I connected with Patrick Downs on 11/10/19 at  1:30 PM EDT by telephone and verified that I am speaking with the correct person using two identifiers.  Location: Patient: Home Provider: Pain control center   I discussed the limitations, risks, security and privacy concerns of performing an evaluation and management service by telephone and the availability of in person appointments. I also discussed with the patient that there may be a patient responsible charge related to this service. The patient expressed understanding and agreed to proceed.   History of Present Illness: I spoke with Patrick Downs via telephone as we were unable to connect for video for the virtual conference.  He reports that he is doing well with his back pain and lower extremity pain.  No significant changes are reported he continues to be well served with the medications he is taking and these continue to control his pain reasonably well.  Otherwise he is in his usual state of health.  Is been doing his stretching strengthening exercises as tolerated.  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  He has been very busy with this and this keeps him on his feet a great portion of the day.  No other changes or problems are reported and he is continued to derive good functional benefit with the medications as well.    Observations/Objective:  Current Outpatient Medications:  .  acetaminophen (TYLENOL) 500 MG tablet, Take 1,000-1,500 mg by mouth 2 (two) times daily as needed for moderate pain or headache., Disp: , Rfl:  .  calcium acetate (PHOSLO) 667 MG capsule, Take 3 capsules (2,001 mg total) by mouth 3 (three) times daily with meals. (Patient taking differently: Take 1,334-2,001 mg by mouth 3 (three) times daily with meals. ), Disp: 1 capsule, Rfl: 0 .  gabapentin (NEURONTIN) 300 MG capsule, Take 600 mg by mouth at bedtime. , Disp: , Rfl:  .  midodrine  (PROAMATINE) 5 MG tablet, Take 5 mg by mouth See admin instructions. Take 2 tablets (10 mg) by mouth before dialysis on Tuesdays, Thursdays, & Saturday; Take 1 tablet (5 mg) by mouth in the evening on  Tuesdays, Thursdays, & Saturdays. On non-dialysis days take 1 tablet (5 mg) by mouth every 8 hours., Disp: , Rfl:  .  oxyCODONE-acetaminophen (PERCOCET) 10-325 MG tablet, Take 1 tablet by mouth every 6 (six) hours as needed for pain., Disp: 120 tablet, Rfl: 0 .  [START ON 12/10/2019] oxyCODONE-acetaminophen (PERCOCET) 10-325 MG tablet, Take 1 tablet by mouth every 6 (six) hours as needed for pain., Disp: 120 tablet, Rfl: 0 .  sildenafil (REVATIO) 20 MG tablet, Take 20-100 mg by mouth as needed (for ED). , Disp: , Rfl:   Assessment and Plan: 1. DDD (degenerative disc disease), lumbar   2. Chronic bilateral low back pain without sciatica   3. Facet arthritis of lumbar region   4. ESRD on dialysis ([place])   5. Guillain-Barre syndrome ([place])   6. Chronic pain syndrome   7. Chronic, continuous use of opioids   8. Weakness of both lower extremities   Based on our discussion today upon review of the [facility] practitioner database information going to refill his medications.  He is currently overdue and I will do this for July 19 and August 18.  No other changes in his pain management protocol will be initiated.  We will schedule him for return to clinic in 2 months.  I have  requested that he be present for a serum drug screen for routine evaluation.  Then scheduled for 26-month return to clinic.  Follow Up Instructions:    I discussed the assessment and treatment plan with the patient. The patient was provided an opportunity to ask questions and all were answered. The patient agreed with the plan and demonstrated an understanding of the instructions.   The patient was advised to call back or seek an in-person evaluation if the symptoms worsen or if the condition fails to improve as anticipated.  I  provided 25 minutes of non-face-to-face time during this encounter.   Molli Barrows, MD

## 2019-11-13 DIAGNOSIS — Z992 Dependence on renal dialysis: Secondary | ICD-10-CM | POA: Diagnosis not present

## 2019-11-13 DIAGNOSIS — N2581 Secondary hyperparathyroidism of renal origin: Secondary | ICD-10-CM | POA: Diagnosis not present

## 2019-11-13 DIAGNOSIS — N186 End stage renal disease: Secondary | ICD-10-CM | POA: Diagnosis not present

## 2019-11-15 DIAGNOSIS — Z992 Dependence on renal dialysis: Secondary | ICD-10-CM | POA: Diagnosis not present

## 2019-11-15 DIAGNOSIS — N186 End stage renal disease: Secondary | ICD-10-CM | POA: Diagnosis not present

## 2019-11-15 DIAGNOSIS — N2581 Secondary hyperparathyroidism of renal origin: Secondary | ICD-10-CM | POA: Diagnosis not present

## 2019-11-20 DIAGNOSIS — Z992 Dependence on renal dialysis: Secondary | ICD-10-CM | POA: Diagnosis not present

## 2019-11-20 DIAGNOSIS — N2581 Secondary hyperparathyroidism of renal origin: Secondary | ICD-10-CM | POA: Diagnosis not present

## 2019-11-20 DIAGNOSIS — N186 End stage renal disease: Secondary | ICD-10-CM | POA: Diagnosis not present

## 2019-11-22 DIAGNOSIS — N186 End stage renal disease: Secondary | ICD-10-CM | POA: Diagnosis not present

## 2019-11-22 DIAGNOSIS — Z992 Dependence on renal dialysis: Secondary | ICD-10-CM | POA: Diagnosis not present

## 2019-11-22 DIAGNOSIS — N051 Unspecified nephritic syndrome with focal and segmental glomerular lesions: Secondary | ICD-10-CM | POA: Diagnosis not present

## 2019-11-22 DIAGNOSIS — N2581 Secondary hyperparathyroidism of renal origin: Secondary | ICD-10-CM | POA: Diagnosis not present

## 2019-11-27 ENCOUNTER — Other Ambulatory Visit
Admission: RE | Admit: 2019-11-27 | Discharge: 2019-11-27 | Disposition: A | Discharge status: Home / Self Care | Payer: Self-pay | Source: Ambulatory Visit | Attending: Nephrology | Admitting: Nephrology

## 2019-11-27 DIAGNOSIS — Z992 Dependence on renal dialysis: Secondary | ICD-10-CM | POA: Diagnosis not present

## 2019-11-27 DIAGNOSIS — N186 End stage renal disease: Secondary | ICD-10-CM | POA: Diagnosis not present

## 2019-11-27 DIAGNOSIS — N2581 Secondary hyperparathyroidism of renal origin: Secondary | ICD-10-CM | POA: Diagnosis not present

## 2019-11-27 DIAGNOSIS — E875 Hyperkalemia: Secondary | ICD-10-CM | POA: Insufficient documentation

## 2019-11-27 LAB — POTASSIUM: Potassium: 3.9 mmol/L (ref 3.5–5.1)

## 2019-11-29 DIAGNOSIS — N2581 Secondary hyperparathyroidism of renal origin: Secondary | ICD-10-CM | POA: Diagnosis not present

## 2019-11-29 DIAGNOSIS — N186 End stage renal disease: Secondary | ICD-10-CM | POA: Diagnosis not present

## 2019-11-29 DIAGNOSIS — Z992 Dependence on renal dialysis: Secondary | ICD-10-CM | POA: Diagnosis not present

## 2019-12-02 DIAGNOSIS — Z992 Dependence on renal dialysis: Secondary | ICD-10-CM | POA: Diagnosis not present

## 2019-12-02 DIAGNOSIS — N186 End stage renal disease: Secondary | ICD-10-CM | POA: Diagnosis not present

## 2019-12-02 DIAGNOSIS — N2581 Secondary hyperparathyroidism of renal origin: Secondary | ICD-10-CM | POA: Diagnosis not present

## 2019-12-04 DIAGNOSIS — Z992 Dependence on renal dialysis: Secondary | ICD-10-CM | POA: Diagnosis not present

## 2019-12-04 DIAGNOSIS — N186 End stage renal disease: Secondary | ICD-10-CM | POA: Diagnosis not present

## 2019-12-04 DIAGNOSIS — N2581 Secondary hyperparathyroidism of renal origin: Secondary | ICD-10-CM | POA: Diagnosis not present

## 2019-12-06 DIAGNOSIS — N2581 Secondary hyperparathyroidism of renal origin: Secondary | ICD-10-CM | POA: Diagnosis not present

## 2019-12-06 DIAGNOSIS — N186 End stage renal disease: Secondary | ICD-10-CM | POA: Diagnosis not present

## 2019-12-06 DIAGNOSIS — Z992 Dependence on renal dialysis: Secondary | ICD-10-CM | POA: Diagnosis not present

## 2019-12-09 DIAGNOSIS — N186 End stage renal disease: Secondary | ICD-10-CM | POA: Diagnosis not present

## 2019-12-09 DIAGNOSIS — Z992 Dependence on renal dialysis: Secondary | ICD-10-CM | POA: Diagnosis not present

## 2019-12-09 DIAGNOSIS — N2581 Secondary hyperparathyroidism of renal origin: Secondary | ICD-10-CM | POA: Diagnosis not present

## 2019-12-11 DIAGNOSIS — Z992 Dependence on renal dialysis: Secondary | ICD-10-CM | POA: Diagnosis not present

## 2019-12-11 DIAGNOSIS — N2581 Secondary hyperparathyroidism of renal origin: Secondary | ICD-10-CM | POA: Diagnosis not present

## 2019-12-11 DIAGNOSIS — N186 End stage renal disease: Secondary | ICD-10-CM | POA: Diagnosis not present

## 2019-12-13 DIAGNOSIS — N2581 Secondary hyperparathyroidism of renal origin: Secondary | ICD-10-CM | POA: Diagnosis not present

## 2019-12-13 DIAGNOSIS — Z992 Dependence on renal dialysis: Secondary | ICD-10-CM | POA: Diagnosis not present

## 2019-12-13 DIAGNOSIS — N186 End stage renal disease: Secondary | ICD-10-CM | POA: Diagnosis not present

## 2019-12-16 DIAGNOSIS — N2581 Secondary hyperparathyroidism of renal origin: Secondary | ICD-10-CM | POA: Diagnosis not present

## 2019-12-16 DIAGNOSIS — N186 End stage renal disease: Secondary | ICD-10-CM | POA: Diagnosis not present

## 2019-12-16 DIAGNOSIS — Z992 Dependence on renal dialysis: Secondary | ICD-10-CM | POA: Diagnosis not present

## 2019-12-18 DIAGNOSIS — Z992 Dependence on renal dialysis: Secondary | ICD-10-CM | POA: Diagnosis not present

## 2019-12-18 DIAGNOSIS — N186 End stage renal disease: Secondary | ICD-10-CM | POA: Diagnosis not present

## 2019-12-18 DIAGNOSIS — N2581 Secondary hyperparathyroidism of renal origin: Secondary | ICD-10-CM | POA: Diagnosis not present

## 2019-12-23 DIAGNOSIS — Z992 Dependence on renal dialysis: Secondary | ICD-10-CM | POA: Diagnosis not present

## 2019-12-23 DIAGNOSIS — N2581 Secondary hyperparathyroidism of renal origin: Secondary | ICD-10-CM | POA: Diagnosis not present

## 2019-12-23 DIAGNOSIS — N186 End stage renal disease: Secondary | ICD-10-CM | POA: Diagnosis not present

## 2019-12-25 DIAGNOSIS — N2581 Secondary hyperparathyroidism of renal origin: Secondary | ICD-10-CM | POA: Diagnosis not present

## 2019-12-25 DIAGNOSIS — N186 End stage renal disease: Secondary | ICD-10-CM | POA: Diagnosis not present

## 2019-12-25 DIAGNOSIS — Z992 Dependence on renal dialysis: Secondary | ICD-10-CM | POA: Diagnosis not present

## 2019-12-27 DIAGNOSIS — N2581 Secondary hyperparathyroidism of renal origin: Secondary | ICD-10-CM | POA: Diagnosis not present

## 2019-12-27 DIAGNOSIS — Z992 Dependence on renal dialysis: Secondary | ICD-10-CM | POA: Diagnosis not present

## 2019-12-27 DIAGNOSIS — N186 End stage renal disease: Secondary | ICD-10-CM | POA: Diagnosis not present

## 2019-12-30 DIAGNOSIS — N186 End stage renal disease: Secondary | ICD-10-CM | POA: Diagnosis not present

## 2019-12-30 DIAGNOSIS — Z992 Dependence on renal dialysis: Secondary | ICD-10-CM | POA: Diagnosis not present

## 2019-12-30 DIAGNOSIS — N2581 Secondary hyperparathyroidism of renal origin: Secondary | ICD-10-CM | POA: Diagnosis not present

## 2020-01-01 DIAGNOSIS — N186 End stage renal disease: Secondary | ICD-10-CM | POA: Diagnosis not present

## 2020-01-01 DIAGNOSIS — N2581 Secondary hyperparathyroidism of renal origin: Secondary | ICD-10-CM | POA: Diagnosis not present

## 2020-01-01 DIAGNOSIS — Z992 Dependence on renal dialysis: Secondary | ICD-10-CM | POA: Diagnosis not present

## 2020-01-02 DIAGNOSIS — Z992 Dependence on renal dialysis: Secondary | ICD-10-CM | POA: Diagnosis not present

## 2020-01-02 DIAGNOSIS — N186 End stage renal disease: Secondary | ICD-10-CM | POA: Diagnosis not present

## 2020-01-02 DIAGNOSIS — N2581 Secondary hyperparathyroidism of renal origin: Secondary | ICD-10-CM | POA: Diagnosis not present

## 2020-01-06 ENCOUNTER — Other Ambulatory Visit: Payer: Self-pay

## 2020-01-06 ENCOUNTER — Ambulatory Visit: Payer: Medicare HMO | Admitting: Anesthesiology

## 2020-01-06 ENCOUNTER — Other Ambulatory Visit
Admission: RE | Admit: 2020-01-06 | Discharge: 2020-01-06 | Disposition: A | Discharge status: Home / Self Care | Payer: Medicare HMO | Attending: Anesthesiology | Admitting: Anesthesiology

## 2020-01-06 DIAGNOSIS — Z0283 Encounter for blood-alcohol and blood-drug test: Secondary | ICD-10-CM | POA: Insufficient documentation

## 2020-01-07 ENCOUNTER — Telehealth: Payer: Self-pay | Admitting: *Deleted

## 2020-01-07 DIAGNOSIS — Z992 Dependence on renal dialysis: Secondary | ICD-10-CM | POA: Diagnosis not present

## 2020-01-07 DIAGNOSIS — N2581 Secondary hyperparathyroidism of renal origin: Secondary | ICD-10-CM | POA: Diagnosis not present

## 2020-01-07 DIAGNOSIS — N186 End stage renal disease: Secondary | ICD-10-CM | POA: Diagnosis not present

## 2020-01-07 NOTE — Telephone Encounter (Signed)
Returned patient's call . No answer. LVM telling him that Dr. Andree Elk had left for the day and he would need to call the office and make another appointment to be seen.

## 2020-01-08 DIAGNOSIS — N2581 Secondary hyperparathyroidism of renal origin: Secondary | ICD-10-CM | POA: Diagnosis not present

## 2020-01-08 DIAGNOSIS — N186 End stage renal disease: Secondary | ICD-10-CM | POA: Diagnosis not present

## 2020-01-08 DIAGNOSIS — Z992 Dependence on renal dialysis: Secondary | ICD-10-CM | POA: Diagnosis not present

## 2020-01-12 ENCOUNTER — Other Ambulatory Visit: Payer: Self-pay

## 2020-01-12 ENCOUNTER — Encounter: Payer: Self-pay | Admitting: Anesthesiology

## 2020-01-12 ENCOUNTER — Ambulatory Visit: Payer: Medicare HMO | Attending: Anesthesiology | Admitting: Anesthesiology

## 2020-01-12 DIAGNOSIS — N186 End stage renal disease: Secondary | ICD-10-CM | POA: Diagnosis not present

## 2020-01-12 DIAGNOSIS — M545 Low back pain: Secondary | ICD-10-CM

## 2020-01-12 DIAGNOSIS — G894 Chronic pain syndrome: Secondary | ICD-10-CM | POA: Diagnosis not present

## 2020-01-12 DIAGNOSIS — R29898 Other symptoms and signs involving the musculoskeletal system: Secondary | ICD-10-CM | POA: Diagnosis not present

## 2020-01-12 DIAGNOSIS — G8929 Other chronic pain: Secondary | ICD-10-CM | POA: Diagnosis not present

## 2020-01-12 DIAGNOSIS — F1190 Opioid use, unspecified, uncomplicated: Secondary | ICD-10-CM

## 2020-01-12 DIAGNOSIS — G610 Guillain-Barre syndrome: Secondary | ICD-10-CM | POA: Diagnosis not present

## 2020-01-12 DIAGNOSIS — M5136 Other intervertebral disc degeneration, lumbar region: Secondary | ICD-10-CM | POA: Diagnosis not present

## 2020-01-12 DIAGNOSIS — M47816 Spondylosis without myelopathy or radiculopathy, lumbar region: Secondary | ICD-10-CM

## 2020-01-12 DIAGNOSIS — Z992 Dependence on renal dialysis: Secondary | ICD-10-CM

## 2020-01-12 MED ORDER — OXYCODONE-ACETAMINOPHEN 10-325 MG PO TABS: 1.0000 | ORAL_TABLET | Freq: Four times a day (QID) | ORAL | 0 refills | Status: DC | PRN

## 2020-01-12 MED ORDER — OXYCODONE-ACETAMINOPHEN 10-325 MG PO TABS: 1.0000 | ORAL_TABLET | Freq: Four times a day (QID) | ORAL | 0 refills | Status: AC | PRN

## 2020-01-12 NOTE — Progress Notes (Signed)
Virtual Visit via Telephone Note  I connected with Patrick Downs on 01/12/20 at  1:15 PM EDT by telephone and verified that I am speaking with the correct person using two identifiers.  Location: Patient: Home Provider: Pain control center   I discussed the limitations, risks, security and privacy concerns of performing an evaluation and management service by telephone and the availability of in person appointments. I also discussed with the patient that there may be a patient responsible charge related to this service. The patient expressed understanding and agreed to proceed.   History of Present Illness: I spoke with Patrick Downs via telephone as he was unable to link up with the video portion of the virtual conference.  He reports that his back pain and diffuse body pain are stable in nature.  He is taking his medications as prescribed and the recent change in dose has continue to work well for him.  He is not experiencing any significant breakthrough pain as previously reported.  His overall daily pain control is much better.  He is experiencing no side effects.  Based on our discussion today he reports that he is deriving good functional benefit with the medications and no side effects are reported.  Otherwise is in his usual state of health.  Furthermore the quality characteristic and distribution of the pain he is experiencing has been stable in nature without change recently.    Observations/Objective:  Current Outpatient Medications:  .  acetaminophen (TYLENOL) 500 MG tablet, Take 1,000-1,500 mg by mouth 2 (two) times daily as needed for moderate pain or headache., Disp: , Rfl:  .  calcium acetate (PHOSLO) 667 MG capsule, Take 3 capsules (2,001 mg total) by mouth 3 (three) times daily with meals. (Patient taking differently: Take 1,334-2,001 mg by mouth 3 (three) times daily with meals. ), Disp: 1 capsule, Rfl: 0 .  gabapentin (NEURONTIN) 300 MG capsule, Take 600 mg by mouth at  bedtime. , Disp: , Rfl:  .  midodrine (PROAMATINE) 5 MG tablet, Take 5 mg by mouth See admin instructions. Take 2 tablets (10 mg) by mouth before dialysis on Tuesdays, Thursdays, & Saturday; Take 1 tablet (5 mg) by mouth in the evening on  Tuesdays, Thursdays, & Saturdays. On non-dialysis days take 1 tablet (5 mg) by mouth every 8 hours., Disp: , Rfl:  .  oxyCODONE-acetaminophen (PERCOCET) 10-325 MG tablet, Take 1 tablet by mouth every 6 (six) hours as needed for pain., Disp: 120 tablet, Rfl: 0 .  [START ON 02/11/2020] oxyCODONE-acetaminophen (PERCOCET) 10-325 MG tablet, Take 1 tablet by mouth every 6 (six) hours as needed for pain., Disp: 120 tablet, Rfl: 0 .  sildenafil (REVATIO) 20 MG tablet, Take 20-100 mg by mouth as needed (for ED). , Disp: , Rfl:   Assessment and Plan: 1. DDD (degenerative disc disease), lumbar   2. Chronic bilateral low back pain without sciatica   3. Facet arthritis of lumbar region   4. ESRD on dialysis ([place])   5. Guillain-Barre syndrome ([place])   6. Chronic pain syndrome   7. Chronic, continuous use of opioids   8. Weakness of both lower extremities   Based on our discussion today I think is appropriate to refill his medications for the next 2 months dated for September 20 in October 20.  Continue core stretching strengthening exercises and efforts at weight loss as previously requested.  I want him to continue follow-up with his primary care physicians for his baseline medical care.  We will schedule  him for 62-month return to clinic.  No other changes are made in his pain clinic protocol today.  Follow Up Instructions:    I discussed the assessment and treatment plan with the patient. The patient was provided an opportunity to ask questions and all were answered. The patient agreed with the plan and demonstrated an understanding of the instructions.   The patient was advised to call back or seek an in-person evaluation if the symptoms worsen or if the condition fails  to improve as anticipated.  I provided 30 minutes of non-face-to-face time during this encounter.   Molli Barrows, MD

## 2020-01-13 DIAGNOSIS — N186 End stage renal disease: Secondary | ICD-10-CM | POA: Diagnosis not present

## 2020-01-13 DIAGNOSIS — N2581 Secondary hyperparathyroidism of renal origin: Secondary | ICD-10-CM | POA: Diagnosis not present

## 2020-01-13 DIAGNOSIS — Z992 Dependence on renal dialysis: Secondary | ICD-10-CM | POA: Diagnosis not present

## 2020-01-13 LAB — DRUG SCREEN 10 W/CONF, SERUM
Amphetamines, IA: NEGATIVE ng/mL
Barbiturates, IA: NEGATIVE ug/mL
Benzodiazepines, IA: NEGATIVE ng/mL
Cocaine & Metabolite, IA: NEGATIVE ng/mL
Methadone, IA: NEGATIVE ng/mL
Opiates, IA: NEGATIVE ng/mL
Oxycodones, IA: NEGATIVE ng/mL
Phencyclidine, IA: NEGATIVE ng/mL
Propoxyphene, IA: NEGATIVE ng/mL
THC(Marijuana) Metabolite, IA: POSITIVE ng/mL — AB

## 2020-01-13 LAB — THC,MS,WB/SP RFX
Cannabidiol: NEGATIVE ng/mL
Cannabinoid Confirmation: POSITIVE
Cannabinol: NEGATIVE ng/mL
Carboxy-THC: 4.3 ng/mL
Hydroxy-THC: NEGATIVE ng/mL
Tetrahydrocannabinol(THC): NEGATIVE ng/mL

## 2020-01-15 DIAGNOSIS — N2581 Secondary hyperparathyroidism of renal origin: Secondary | ICD-10-CM | POA: Diagnosis not present

## 2020-01-15 DIAGNOSIS — Z992 Dependence on renal dialysis: Secondary | ICD-10-CM | POA: Diagnosis not present

## 2020-01-15 DIAGNOSIS — N186 End stage renal disease: Secondary | ICD-10-CM | POA: Diagnosis not present

## 2020-01-17 DIAGNOSIS — N186 End stage renal disease: Secondary | ICD-10-CM | POA: Diagnosis not present

## 2020-01-17 DIAGNOSIS — Z992 Dependence on renal dialysis: Secondary | ICD-10-CM | POA: Diagnosis not present

## 2020-01-17 DIAGNOSIS — N2581 Secondary hyperparathyroidism of renal origin: Secondary | ICD-10-CM | POA: Diagnosis not present

## 2020-01-20 DIAGNOSIS — Z992 Dependence on renal dialysis: Secondary | ICD-10-CM | POA: Diagnosis not present

## 2020-01-20 DIAGNOSIS — N186 End stage renal disease: Secondary | ICD-10-CM | POA: Diagnosis not present

## 2020-01-20 DIAGNOSIS — N2581 Secondary hyperparathyroidism of renal origin: Secondary | ICD-10-CM | POA: Diagnosis not present

## 2020-01-22 DIAGNOSIS — N051 Unspecified nephritic syndrome with focal and segmental glomerular lesions: Secondary | ICD-10-CM | POA: Diagnosis not present

## 2020-01-22 DIAGNOSIS — Z992 Dependence on renal dialysis: Secondary | ICD-10-CM | POA: Diagnosis not present

## 2020-01-22 DIAGNOSIS — N186 End stage renal disease: Secondary | ICD-10-CM | POA: Diagnosis not present

## 2020-01-24 DIAGNOSIS — D631 Anemia in chronic kidney disease: Secondary | ICD-10-CM | POA: Diagnosis present

## 2020-01-24 DIAGNOSIS — E1140 Type 2 diabetes mellitus with diabetic neuropathy, unspecified: Secondary | ICD-10-CM | POA: Diagnosis present

## 2020-01-24 DIAGNOSIS — E875 Hyperkalemia: Secondary | ICD-10-CM | POA: Diagnosis present

## 2020-01-24 DIAGNOSIS — R778 Other specified abnormalities of plasma proteins: Secondary | ICD-10-CM | POA: Diagnosis not present

## 2020-01-24 DIAGNOSIS — R0902 Hypoxemia: Secondary | ICD-10-CM | POA: Diagnosis not present

## 2020-01-24 DIAGNOSIS — I10 Essential (primary) hypertension: Secondary | ICD-10-CM | POA: Diagnosis not present

## 2020-01-24 DIAGNOSIS — R6520 Severe sepsis without septic shock: Secondary | ICD-10-CM | POA: Diagnosis present

## 2020-01-24 DIAGNOSIS — E1122 Type 2 diabetes mellitus with diabetic chronic kidney disease: Secondary | ICD-10-CM | POA: Diagnosis present

## 2020-01-24 DIAGNOSIS — D696 Thrombocytopenia, unspecified: Secondary | ICD-10-CM | POA: Diagnosis present

## 2020-01-24 DIAGNOSIS — Z841 Family history of disorders of kidney and ureter: Secondary | ICD-10-CM

## 2020-01-24 DIAGNOSIS — A4189 Other specified sepsis: Principal | ICD-10-CM | POA: Diagnosis present

## 2020-01-24 DIAGNOSIS — Z833 Family history of diabetes mellitus: Secondary | ICD-10-CM

## 2020-01-24 DIAGNOSIS — I959 Hypotension, unspecified: Secondary | ICD-10-CM | POA: Diagnosis not present

## 2020-01-24 DIAGNOSIS — I248 Other forms of acute ischemic heart disease: Secondary | ICD-10-CM | POA: Diagnosis present

## 2020-01-24 DIAGNOSIS — Z992 Dependence on renal dialysis: Secondary | ICD-10-CM | POA: Diagnosis not present

## 2020-01-24 DIAGNOSIS — J9601 Acute respiratory failure with hypoxia: Secondary | ICD-10-CM | POA: Diagnosis present

## 2020-01-24 DIAGNOSIS — N186 End stage renal disease: Secondary | ICD-10-CM | POA: Diagnosis present

## 2020-01-24 DIAGNOSIS — F32A Depression, unspecified: Secondary | ICD-10-CM | POA: Diagnosis present

## 2020-01-24 DIAGNOSIS — R52 Pain, unspecified: Secondary | ICD-10-CM | POA: Diagnosis not present

## 2020-01-24 DIAGNOSIS — J9600 Acute respiratory failure, unspecified whether with hypoxia or hypercapnia: Secondary | ICD-10-CM | POA: Diagnosis not present

## 2020-01-24 DIAGNOSIS — J1282 Pneumonia due to coronavirus disease 2019: Secondary | ICD-10-CM | POA: Diagnosis present

## 2020-01-24 DIAGNOSIS — R9431 Abnormal electrocardiogram [ECG] [EKG]: Secondary | ICD-10-CM | POA: Diagnosis present

## 2020-01-24 DIAGNOSIS — N2581 Secondary hyperparathyroidism of renal origin: Secondary | ICD-10-CM | POA: Diagnosis not present

## 2020-01-24 DIAGNOSIS — U071 COVID-19: Secondary | ICD-10-CM | POA: Diagnosis present

## 2020-01-24 DIAGNOSIS — A419 Sepsis, unspecified organism: Secondary | ICD-10-CM | POA: Diagnosis not present

## 2020-01-24 DIAGNOSIS — Z79899 Other long term (current) drug therapy: Secondary | ICD-10-CM | POA: Diagnosis not present

## 2020-01-24 DIAGNOSIS — R509 Fever, unspecified: Secondary | ICD-10-CM | POA: Diagnosis not present

## 2020-01-24 DIAGNOSIS — Z888 Allergy status to other drugs, medicaments and biological substances status: Secondary | ICD-10-CM | POA: Diagnosis not present

## 2020-01-24 DIAGNOSIS — I120 Hypertensive chronic kidney disease with stage 5 chronic kidney disease or end stage renal disease: Secondary | ICD-10-CM | POA: Diagnosis present

## 2020-01-24 DIAGNOSIS — R918 Other nonspecific abnormal finding of lung field: Secondary | ICD-10-CM | POA: Diagnosis not present

## 2020-01-24 DIAGNOSIS — Z885 Allergy status to narcotic agent status: Secondary | ICD-10-CM | POA: Diagnosis not present

## 2020-01-24 DIAGNOSIS — E071 Dyshormogenetic goiter: Secondary | ICD-10-CM | POA: Diagnosis not present

## 2020-01-24 NOTE — ED Triage Notes (Signed)
First Nurse: patient brought in by ems from home. Patient with complaint of increase in his neuropathy pain. Patient also has a fever of 102.4 by ems. Patient states that he has been running a fever times two days.

## 2020-01-25 ENCOUNTER — Other Ambulatory Visit: Payer: Self-pay

## 2020-01-25 ENCOUNTER — Emergency Department: Payer: Medicare HMO

## 2020-01-25 ENCOUNTER — Inpatient Hospital Stay
Admission: EM | Admit: 2020-01-25 | Discharge: 2020-01-28 | DRG: 871 | Disposition: A | Discharge status: Home / Self Care | Payer: Medicare HMO | Attending: Internal Medicine | Admitting: Internal Medicine

## 2020-01-25 DIAGNOSIS — I248 Other forms of acute ischemic heart disease: Secondary | ICD-10-CM | POA: Diagnosis present

## 2020-01-25 DIAGNOSIS — A419 Sepsis, unspecified organism: Secondary | ICD-10-CM | POA: Diagnosis not present

## 2020-01-25 DIAGNOSIS — N186 End stage renal disease: Secondary | ICD-10-CM | POA: Diagnosis present

## 2020-01-25 DIAGNOSIS — G629 Polyneuropathy, unspecified: Secondary | ICD-10-CM

## 2020-01-25 DIAGNOSIS — U071 COVID-19: Secondary | ICD-10-CM | POA: Diagnosis present

## 2020-01-25 DIAGNOSIS — E1122 Type 2 diabetes mellitus with diabetic chronic kidney disease: Secondary | ICD-10-CM | POA: Diagnosis present

## 2020-01-25 DIAGNOSIS — R7989 Other specified abnormal findings of blood chemistry: Secondary | ICD-10-CM | POA: Diagnosis present

## 2020-01-25 DIAGNOSIS — Z992 Dependence on renal dialysis: Secondary | ICD-10-CM

## 2020-01-25 DIAGNOSIS — E875 Hyperkalemia: Secondary | ICD-10-CM | POA: Diagnosis present

## 2020-01-25 DIAGNOSIS — Z79899 Other long term (current) drug therapy: Secondary | ICD-10-CM | POA: Diagnosis not present

## 2020-01-25 DIAGNOSIS — D631 Anemia in chronic kidney disease: Secondary | ICD-10-CM | POA: Diagnosis not present

## 2020-01-25 DIAGNOSIS — Z885 Allergy status to narcotic agent status: Secondary | ICD-10-CM | POA: Diagnosis not present

## 2020-01-25 DIAGNOSIS — D696 Thrombocytopenia, unspecified: Secondary | ICD-10-CM | POA: Diagnosis not present

## 2020-01-25 DIAGNOSIS — R509 Fever, unspecified: Secondary | ICD-10-CM | POA: Diagnosis not present

## 2020-01-25 DIAGNOSIS — E071 Dyshormogenetic goiter: Secondary | ICD-10-CM | POA: Diagnosis not present

## 2020-01-25 DIAGNOSIS — I120 Hypertensive chronic kidney disease with stage 5 chronic kidney disease or end stage renal disease: Secondary | ICD-10-CM | POA: Diagnosis present

## 2020-01-25 DIAGNOSIS — R6520 Severe sepsis without septic shock: Secondary | ICD-10-CM | POA: Diagnosis present

## 2020-01-25 DIAGNOSIS — D638 Anemia in other chronic diseases classified elsewhere: Secondary | ICD-10-CM | POA: Diagnosis present

## 2020-01-25 DIAGNOSIS — J9601 Acute respiratory failure with hypoxia: Secondary | ICD-10-CM

## 2020-01-25 DIAGNOSIS — I959 Hypotension, unspecified: Secondary | ICD-10-CM | POA: Diagnosis not present

## 2020-01-25 DIAGNOSIS — R778 Other specified abnormalities of plasma proteins: Secondary | ICD-10-CM

## 2020-01-25 DIAGNOSIS — A4189 Other specified sepsis: Secondary | ICD-10-CM | POA: Diagnosis present

## 2020-01-25 DIAGNOSIS — Z888 Allergy status to other drugs, medicaments and biological substances status: Secondary | ICD-10-CM | POA: Diagnosis not present

## 2020-01-25 DIAGNOSIS — R9431 Abnormal electrocardiogram [ECG] [EKG]: Secondary | ICD-10-CM | POA: Diagnosis present

## 2020-01-25 DIAGNOSIS — Z833 Family history of diabetes mellitus: Secondary | ICD-10-CM | POA: Diagnosis not present

## 2020-01-25 DIAGNOSIS — F32A Depression, unspecified: Secondary | ICD-10-CM | POA: Diagnosis present

## 2020-01-25 DIAGNOSIS — Z841 Family history of disorders of kidney and ureter: Secondary | ICD-10-CM | POA: Diagnosis not present

## 2020-01-25 DIAGNOSIS — E1140 Type 2 diabetes mellitus with diabetic neuropathy, unspecified: Secondary | ICD-10-CM | POA: Diagnosis present

## 2020-01-25 DIAGNOSIS — J9600 Acute respiratory failure, unspecified whether with hypoxia or hypercapnia: Secondary | ICD-10-CM | POA: Diagnosis not present

## 2020-01-25 DIAGNOSIS — J1282 Pneumonia due to coronavirus disease 2019: Secondary | ICD-10-CM | POA: Diagnosis present

## 2020-01-25 DIAGNOSIS — R918 Other nonspecific abnormal finding of lung field: Secondary | ICD-10-CM | POA: Diagnosis not present

## 2020-01-25 LAB — COMPREHENSIVE METABOLIC PANEL
ALT: 22 U/L (ref 0–44)
AST: 29 U/L (ref 15–41)
Albumin: 4.1 g/dL (ref 3.5–5.0)
Alkaline Phosphatase: 41 U/L (ref 38–126)
Anion gap: 14 (ref 5–15)
BUN: 33 mg/dL — ABNORMAL HIGH (ref 6–20)
CO2: 23 mmol/L (ref 22–32)
Calcium: 9.2 mg/dL (ref 8.9–10.3)
Chloride: 98 mmol/L (ref 98–111)
Creatinine, Ser: 12.67 mg/dL — ABNORMAL HIGH (ref 0.61–1.24)
GFR calc Af Amer: 5 mL/min — ABNORMAL LOW (ref >60)
GFR calc non Af Amer: 4 mL/min — ABNORMAL LOW (ref >60)
Glucose, Bld: 110 mg/dL — ABNORMAL HIGH (ref 70–99)
Potassium: 5.4 mmol/L — ABNORMAL HIGH (ref 3.5–5.1)
Sodium: 135 mmol/L (ref 135–145)
Total Bilirubin: 0.8 mg/dL (ref 0.3–1.2)
Total Protein: 7.8 g/dL (ref 6.5–8.1)

## 2020-01-25 LAB — CBC WITH DIFFERENTIAL/PLATELET
Abs Immature Granulocytes: 0.01 10*3/uL (ref 0.00–0.07)
Basophils Absolute: 0 10*3/uL (ref 0.0–0.1)
Basophils Relative: 0 %
Eosinophils Absolute: 0 10*3/uL (ref 0.0–0.5)
Eosinophils Relative: 0 %
HCT: 34.3 % — ABNORMAL LOW (ref 39.0–52.0)
Hemoglobin: 11.2 g/dL — ABNORMAL LOW (ref 13.0–17.0)
Immature Granulocytes: 0 %
Lymphocytes Relative: 23 %
Lymphs Abs: 0.7 10*3/uL (ref 0.7–4.0)
MCH: 28.6 pg (ref 26.0–34.0)
MCHC: 32.7 g/dL (ref 30.0–36.0)
MCV: 87.7 fL (ref 80.0–100.0)
Monocytes Absolute: 0.4 10*3/uL (ref 0.1–1.0)
Monocytes Relative: 15 %
Neutro Abs: 1.9 10*3/uL (ref 1.7–7.7)
Neutrophils Relative %: 62 %
Platelets: 93 10*3/uL — ABNORMAL LOW (ref 150–400)
RBC: 3.91 MIL/uL — ABNORMAL LOW (ref 4.22–5.81)
RDW: 15 % (ref 11.5–15.5)
WBC: 3 10*3/uL — ABNORMAL LOW (ref 4.0–10.5)
nRBC: 0 % (ref 0.0–0.2)

## 2020-01-25 LAB — PHOSPHORUS: Phosphorus: 4 mg/dL (ref 2.5–4.6)

## 2020-01-25 LAB — C-REACTIVE PROTEIN: CRP: 7.2 mg/dL — ABNORMAL HIGH (ref <1.0)

## 2020-01-25 LAB — TROPONIN I (HIGH SENSITIVITY)
Troponin I (High Sensitivity): 39 ng/L — ABNORMAL HIGH (ref <18)
Troponin I (High Sensitivity): 37 ng/L — ABNORMAL HIGH (ref <18)

## 2020-01-25 LAB — BRAIN NATRIURETIC PEPTIDE: B Natriuretic Peptide: 148.4 pg/mL — ABNORMAL HIGH (ref 0.0–100.0)

## 2020-01-25 LAB — PROTIME-INR
INR: 1.1 (ref 0.8–1.2)
Prothrombin Time: 13.5 seconds (ref 11.4–15.2)

## 2020-01-25 LAB — RESPIRATORY PANEL BY RT PCR (FLU A&B, COVID)
Influenza A by PCR: NEGATIVE
Influenza B by PCR: NEGATIVE
SARS Coronavirus 2 by RT PCR: POSITIVE — AB

## 2020-01-25 LAB — FIBRIN DERIVATIVES D-DIMER (ARMC ONLY): Fibrin derivatives D-dimer (ARMC): 986.9 ng/mL (FEU) — ABNORMAL HIGH (ref 0.00–499.00)

## 2020-01-25 LAB — FIBRINOGEN: Fibrinogen: 399 mg/dL (ref 210–475)

## 2020-01-25 LAB — PROCALCITONIN: Procalcitonin: 1.74 ng/mL

## 2020-01-25 LAB — FERRITIN: Ferritin: 1368 ng/mL — ABNORMAL HIGH (ref 24–336)

## 2020-01-25 LAB — TRIGLYCERIDES: Triglycerides: 163 mg/dL — ABNORMAL HIGH (ref <150)

## 2020-01-25 LAB — HEPATITIS B SURFACE ANTIGEN: Hepatitis B Surface Ag: NONREACTIVE

## 2020-01-25 LAB — LACTIC ACID, PLASMA: Lactic Acid, Venous: 1 mmol/L (ref 0.5–1.9)

## 2020-01-25 LAB — LACTATE DEHYDROGENASE: LDH: 189 U/L (ref 98–192)

## 2020-01-25 MED ORDER — ORAL CARE MOUTH RINSE
15.0000 mL | Freq: Two times a day (BID) | OROMUCOSAL | Status: DC
  Administered 2020-01-25 – 2020-01-28 (×6): 15 mL via OROMUCOSAL

## 2020-01-25 MED ORDER — OXYCODONE-ACETAMINOPHEN 5-325 MG PO TABS
1.0000 | ORAL_TABLET | Freq: Four times a day (QID) | ORAL | Status: DC | PRN
  Administered 2020-01-25 – 2020-01-28 (×8): 1 via ORAL
  Filled 2020-01-25 (×8): qty 1

## 2020-01-25 MED ORDER — LACTATED RINGERS IV BOLUS
500.0000 mL | Freq: Once | INTRAVENOUS | Status: AC
  Administered 2020-01-25: 500 mL via INTRAVENOUS

## 2020-01-25 MED ORDER — FERRIC CITRATE 1 GM 210 MG(FE) PO TABS
420.0000 mg | ORAL_TABLET | Freq: Three times a day (TID) | ORAL | Status: DC
  Administered 2020-01-25 – 2020-01-28 (×10): 420 mg via ORAL
  Filled 2020-01-25 (×14): qty 2

## 2020-01-25 MED ORDER — HYDROXYZINE HCL 25 MG PO TABS
25.0000 mg | ORAL_TABLET | Freq: Two times a day (BID) | ORAL | Status: DC | PRN
  Administered 2020-01-26 – 2020-01-28 (×4): 25 mg via ORAL
  Filled 2020-01-25 (×7): qty 1

## 2020-01-25 MED ORDER — CHLORHEXIDINE GLUCONATE CLOTH 2 % EX PADS
6.0000 | MEDICATED_PAD | Freq: Every day | CUTANEOUS | Status: DC
  Administered 2020-01-25 – 2020-01-28 (×4): 6 via TOPICAL
  Filled 2020-01-25: qty 6

## 2020-01-25 MED ORDER — SODIUM ZIRCONIUM CYCLOSILICATE 5 G PO PACK
5.0000 g | PACK | Freq: Once | ORAL | Status: AC
  Administered 2020-01-25: 17:00:00 5 g via ORAL
  Filled 2020-01-25 (×2): qty 1

## 2020-01-25 MED ORDER — MIDODRINE HCL 5 MG PO TABS
5.0000 mg | ORAL_TABLET | Freq: Once | ORAL | Status: AC
  Administered 2020-01-25: 5 mg via ORAL
  Filled 2020-01-25 (×2): qty 1

## 2020-01-25 MED ORDER — MIDODRINE HCL 5 MG PO TABS
5.0000 mg | ORAL_TABLET | Freq: Three times a day (TID) | ORAL | Status: DC
  Administered 2020-01-25 – 2020-01-28 (×11): 5 mg via ORAL
  Filled 2020-01-25 (×11): qty 1

## 2020-01-25 MED ORDER — PENTAFLUOROPROP-TETRAFLUOROETH EX AERO
1.0000 "application " | INHALATION_SPRAY | CUTANEOUS | Status: DC | PRN
  Filled 2020-01-25: qty 30

## 2020-01-25 MED ORDER — SODIUM CHLORIDE 0.9 % IV SOLN
100.0000 mg | Freq: Every day | INTRAVENOUS | Status: DC
  Administered 2020-01-26 – 2020-01-28 (×3): 100 mg via INTRAVENOUS
  Filled 2020-01-25 (×3): qty 20

## 2020-01-25 MED ORDER — CALCIUM ACETATE (PHOS BINDER) 667 MG PO CAPS
1334.0000 mg | ORAL_CAPSULE | Freq: Three times a day (TID) | ORAL | Status: DC
  Administered 2020-01-25 (×2): 1334 mg via ORAL
  Administered 2020-01-26: 2001 mg via ORAL
  Administered 2020-01-26: 10:00:00 1334 mg via ORAL
  Administered 2020-01-26: 2001 mg via ORAL
  Administered 2020-01-27: 12:00:00 1334 mg via ORAL
  Administered 2020-01-27 – 2020-01-28 (×5): 2001 mg via ORAL
  Filled 2020-01-25 (×12): qty 3

## 2020-01-25 MED ORDER — METHYLPREDNISOLONE SODIUM SUCC 125 MG IJ SOLR
125.0000 mg | Freq: Once | INTRAMUSCULAR | Status: AC
  Administered 2020-01-25: 125 mg via INTRAVENOUS
  Filled 2020-01-25: qty 2

## 2020-01-25 MED ORDER — OXYCODONE HCL 5 MG PO TABS
5.0000 mg | ORAL_TABLET | Freq: Four times a day (QID) | ORAL | Status: DC | PRN
  Administered 2020-01-25 – 2020-01-28 (×7): 5 mg via ORAL
  Filled 2020-01-25 (×7): qty 1

## 2020-01-25 MED ORDER — ZINC SULFATE 220 (50 ZN) MG PO CAPS
220.0000 mg | ORAL_CAPSULE | Freq: Every day | ORAL | Status: DC
  Administered 2020-01-25 – 2020-01-28 (×4): 220 mg via ORAL
  Filled 2020-01-25 (×4): qty 1

## 2020-01-25 MED ORDER — ACETAMINOPHEN 325 MG PO TABS: 650.0000 mg | ORAL_TABLET | Freq: Four times a day (QID) | ORAL | Status: DC | PRN

## 2020-01-25 MED ORDER — GABAPENTIN 300 MG PO CAPS
600.0000 mg | ORAL_CAPSULE | Freq: Every day | ORAL | Status: DC
  Administered 2020-01-25 – 2020-01-27 (×3): 600 mg via ORAL
  Filled 2020-01-25 (×3): qty 2

## 2020-01-25 MED ORDER — ALTEPLASE 2 MG IJ SOLR
2.0000 mg | Freq: Once | INTRAMUSCULAR | Status: DC | PRN
  Filled 2020-01-25: qty 2

## 2020-01-25 MED ORDER — HYDROXYZINE HCL 10 MG PO TABS
10.0000 mg | ORAL_TABLET | Freq: Three times a day (TID) | ORAL | Status: DC | PRN
  Filled 2020-01-25: qty 1

## 2020-01-25 MED ORDER — LIDOCAINE HCL (PF) 1 % IJ SOLN
5.0000 mL | INTRAMUSCULAR | Status: DC | PRN
  Filled 2020-01-25: qty 5

## 2020-01-25 MED ORDER — ALBUTEROL SULFATE HFA 108 (90 BASE) MCG/ACT IN AERS
2.0000 | INHALATION_SPRAY | RESPIRATORY_TRACT | Status: DC | PRN
  Filled 2020-01-25: qty 6.7

## 2020-01-25 MED ORDER — LIDOCAINE-PRILOCAINE 2.5-2.5 % EX CREA
1.0000 "application " | TOPICAL_CREAM | CUTANEOUS | Status: DC | PRN
  Filled 2020-01-25: qty 5

## 2020-01-25 MED ORDER — IPRATROPIUM BROMIDE HFA 17 MCG/ACT IN AERS
2.0000 | INHALATION_SPRAY | Freq: Four times a day (QID) | RESPIRATORY_TRACT | Status: DC
  Administered 2020-01-25 – 2020-01-28 (×14): 2 via RESPIRATORY_TRACT
  Filled 2020-01-25 (×2): qty 12.9

## 2020-01-25 MED ORDER — HEPARIN SODIUM (PORCINE) 1000 UNIT/ML DIALYSIS
1000.0000 [IU] | INTRAMUSCULAR | Status: DC | PRN
  Filled 2020-01-25 (×2): qty 1

## 2020-01-25 MED ORDER — ACETAMINOPHEN 500 MG PO TABS
1000.0000 mg | ORAL_TABLET | Freq: Once | ORAL | Status: AC
  Administered 2020-01-25: 1000 mg via ORAL

## 2020-01-25 MED ORDER — SODIUM CHLORIDE 0.9 % IV SOLN: 100.0000 mL | INTRAVENOUS | Status: DC | PRN

## 2020-01-25 MED ORDER — ASCORBIC ACID 500 MG PO TABS
500.0000 mg | ORAL_TABLET | Freq: Every day | ORAL | Status: DC
  Administered 2020-01-25 – 2020-01-28 (×4): 500 mg via ORAL
  Filled 2020-01-25 (×4): qty 1

## 2020-01-25 MED ORDER — SODIUM CHLORIDE 0.9 % IV SOLN
200.0000 mg | Freq: Once | INTRAVENOUS | Status: AC
  Administered 2020-01-25: 200 mg via INTRAVENOUS
  Filled 2020-01-25: qty 200

## 2020-01-25 MED ORDER — DM-GUAIFENESIN ER 30-600 MG PO TB12: 1.0000 | ORAL_TABLET | Freq: Two times a day (BID) | ORAL | Status: DC | PRN

## 2020-01-25 MED ORDER — OXYCODONE-ACETAMINOPHEN 10-325 MG PO TABS: 1.0000 | ORAL_TABLET | Freq: Four times a day (QID) | ORAL | Status: DC | PRN

## 2020-01-25 MED ORDER — METHYLPREDNISOLONE SODIUM SUCC 125 MG IJ SOLR
60.0000 mg | Freq: Once | INTRAMUSCULAR | Status: AC
  Administered 2020-01-25: 17:00:00 60 mg via INTRAVENOUS
  Filled 2020-01-25: qty 2

## 2020-01-25 NOTE — ED Notes (Signed)
Neuropathy to hands and feet, worse that normal and fevers X 2 days.

## 2020-01-25 NOTE — ED Notes (Signed)
Dr. Beather Arbour and ED charge RN notified that pt tested positive for covid-19

## 2020-01-25 NOTE — ED Provider Notes (Signed)
[facility] Emergency Department Provider Note  ____________________________________________  Time seen: Approximately 4:18 AM  I have reviewed the triage vital signs and the nursing notes.   HISTORY  Chief Complaint Fever and Peripheral Neuropathy   HPI Patrick Downs is a 44 y.o. male with a history of ESRD on HD (TTS), hypotension on midodrine, GBS, DM, anemia who presents for evaluation of fever and worsening neuropathy. Symptoms have been ongoing for 2 days.  Patient has had a fever at home and complains of worsening neuropathy on his extremities.  He takes oxycodone and gabapentin for it.  He denies sore throat, cough, chest pain, shortness of breath, abdominal pain, vomiting, diarrhea.  Patient did miss dialysis on Thursday but was dialyzed yesterday.  Patient is supposed to be on midodrine for hypotension however has not been taking it for several months.  Patient is not vaccinated against Covid.  Denies any known exposures to Covid.  Past Medical History:  Diagnosis Date  . Depression   . Dialysis patient ([place])   . ESRD (end stage renal disease) on dialysis ([place])   . Failure to thrive in adult   . Gout   . Guillain Barr syndrome ([place])   . HTN (hypertension)   . Kidney failure   . Pneumonia   . Renal insufficiency   . Respiratory failure [facility])     Patient Active Problem List   Diagnosis Date Noted  . Sepsis without acute organ dysfunction ([place]) 08/10/2019  . Murmur 02/19/2019  . GERD (gastroesophageal reflux disease) 03/01/2018  . Degenerative disc disease, lumbar 01/09/2018  . HTN (hypertension) 12/28/2017  . Hyperkalemia 08/16/2017  . Erectile dysfunction due to diseases classified elsewhere 05/31/2016  . Polyneuropathy, unspecified 08/02/2015  . Anemia, unspecified 08/02/2015  . Gastroenteritis 08/02/2015  . Heart failure, unspecified ([place]) 08/02/2015  . Secondary hyperparathyroidism of renal origin ([place]) 08/02/2015  . Encounter  for fitting of gastrointestinal device 06/04/2015  . Idiopathic chronic hypotension 12/11/2014  . Rectal pain 11/27/2014  . Other specified diseases of anus and rectum 11/27/2014  . Encounter for imaging study to confirm nasogastric (NG) tube placement   . Encounter for nasogastric (NG) tube placement   . Respiratory failure, acute ([place])   . Guillain Barr syndrome, lower ext weakness 08/27/2014  . CAP (community acquired pneumonia) 08/23/2014  . ESRD on dialysis ([place]) 08/23/2014  . A-V fistula ([place]) 08/23/2014  . ARDS (adult respiratory distress syndrome) ([place]) 08/23/2014  . Septic shock ([place]) 08/23/2014  . Hypotension 08/23/2014  . Acute respiratory failure ([place]) 08/23/2014  . Fever   . Type 2 diabetes mellitus ([place]) 07/01/2014  . Type 2 diabetes mellitus with diabetic polyneuropathy ([place]) 07/01/2014  . Photophobia 12/11/2013  . Spells 10/30/2013  . Dizziness 10/30/2013  . Other mechanical complication of surgically created arteriovenous fistula, initial encounter ([place]) 02/19/2013  . Foot drop 12/09/2012  . History of Guillain-Barre syndrome 12/09/2012    Past Surgical History:  Procedure Laterality Date  . A/V FISTULAGRAM Right 01/07/2018   Procedure: A/V FISTULAGRAM;  Surgeon: Algernon Huxley, MD;  Location: [facility];  Service: Cardiovascular;  Laterality: Right;  . AMPUTATION TOE Right 06/08/2017   Procedure: AMPUTATION TOE RIGHT FIFTH TOE;  Surgeon: Samara Deist, DPM;  Location: [facility];  Service: Podiatry;  Laterality: Right;  . AMPUTATION TOE Left 05/17/2018   Procedure: RAY LEFT 5TH;  Surgeon: Samara Deist, DPM;  Location: [facility];  Service: Podiatry;  Laterality: Left;  . AV FISTULA PLACEMENT  x5      2 graphs  . DIALYSIS/PERMA CATHETER INSERTION N/A 01/20/2019   Procedure: DIALYSIS/PERMA CATHETER INSERTION;  Surgeon: Algernon Huxley, MD;  Location: [place];  Service: Cardiovascular;  Laterality: N/A;  . DIALYSIS/PERMA CATHETER REMOVAL N/A  06/17/2018   Procedure: DIALYSIS/PERMA CATHETER REMOVAL;  Surgeon: Algernon Huxley, MD;  Location: [place] [facility];  Service: Cardiovascular;  Laterality: N/A;  . PARATHYROIDECTOMY    . PERIPHERAL VASCULAR THROMBECTOMY Right 01/22/2019   Procedure: PERIPHERAL VASCULAR THROMBECTOMY;  Surgeon: Algernon Huxley, MD;  Location: [place] [facility];  Service: Cardiovascular;  Laterality: Right;  . RENAL BIOPSY    . REVISON OF ARTERIOVENOUS FISTULA Right 02/07/2018   Procedure: REVISON OF ARTERIOVENOUS FISTULA;  Surgeon: Algernon Huxley, MD;  Location: [facility];  Service: Vascular;  Laterality: Right;  . TEE WITHOUT CARDIOVERSION N/A 01/22/2018   Procedure: TRANSESOPHAGEAL ECHOCARDIOGRAM (TEE);  Surgeon: Dionisio David, MD;  Location: [facility];  Service: Cardiovascular;  Laterality: N/A;  . tonsiilectomy    . tracheotomy      Prior to Admission medications   Medication Sig Start Date End Date Taking? Authorizing Provider  acetaminophen (TYLENOL) 500 MG tablet Take 1,000-1,500 mg by mouth 2 (two) times daily as needed for moderate pain or headache.    [provider]  calcium acetate (PHOSLO) 667 MG capsule Take 3 capsules (2,001 mg total) by mouth 3 (three) times daily with meals. Patient taking differently: Take 1,334-2,001 mg by mouth 3 (three) times daily with meals.  08/26/14   [name], MD  gabapentin (NEURONTIN) 300 MG capsule Take 600 mg by mouth at bedtime.     [provider]  midodrine (PROAMATINE) 5 MG tablet Take 5 mg by mouth See admin instructions. Take 2 tablets (10 mg) by mouth before dialysis on Tuesdays, Thursdays, & Saturday; Take 1 tablet (5 mg) by mouth in the evening on  Tuesdays, Thursdays, & Saturdays. On non-dialysis days take 1 tablet (5 mg) by mouth every 8 hours.    [provider]  oxyCODONE-acetaminophen (PERCOCET) 10-325 MG tablet Take 1 tablet by mouth every 6 (six) hours as needed for pain. 01/12/20 02/11/20  Molli Barrows, MD   oxyCODONE-acetaminophen (PERCOCET) 10-325 MG tablet Take 1 tablet by mouth every 6 (six) hours as needed for pain. 02/11/20 03/12/20  Molli Barrows, MD  sildenafil (REVATIO) 20 MG tablet Take 20-100 mg by mouth as needed (for ED).  03/02/17   [provider]    Allergies Ondansetron, Minoxidil, Morphine and related, and Omnipaque [iohexol]  Family History  Problem Relation Age of Onset  . Diabetes Mellitus II Father   . Kidney disease Father   . Kidney failure Paternal Grandfather   . Prostate cancer Neg Hx   . Kidney cancer Neg Hx   . Bladder Cancer Neg Hx     Social History Social History   Tobacco Use  . Smoking status: Never Smoker  . Smokeless tobacco: Never Used  Vaping Use  . Vaping Use: Never used  Substance Use Topics  . Alcohol use: No  . Drug use: Yes    Types: Marijuana    Comment: last time was June    Review of Systems  Constitutional: + fever. Eyes: Negative for visual changes. ENT: Negative for sore throat. Neck: No neck pain  Cardiovascular: Negative for chest pain. Respiratory: Negative for shortness of breath. Gastrointestinal: Negative for abdominal pain, vomiting or diarrhea. Genitourinary: Negative for dysuria. Musculoskeletal: Negative  for back pain. Skin: Negative for rash. Neurological: Negative for headaches, weakness or numbness. + neuropathy Psych: No SI or HI  ____________________________________________   PHYSICAL EXAM:  VITAL SIGNS: Vitals:   01/25/20 0400 01/25/20 0432  BP: (!) 82/54 (!) 91/58  Pulse: 80 79  Resp:  14  Temp:    SpO2: 96% 93%    Constitutional: Alert and oriented, no apparent distress. HEENT:      Head: Normocephalic and atraumatic.         Eyes: Conjunctivae are normal. Sclera is non-icteric.       Mouth/Throat: Mucous membranes are moist.       Neck: Supple with no signs of meningismus. Cardiovascular: Regular rate and rhythm. No murmurs, gallops, or rubs. 2+ symmetrical distal pulses are  present in all extremities. No JVD. Respiratory: Normal respiratory effort. Lungs are clear to auscultation bilaterally. No wheezes, crackles, or rhonchi.  Gastrointestinal: Soft, non tender, and non distended. Musculoskeletal:  No edema, cyanosis, or erythema of extremities. Neurologic: Normal speech and language. Face is symmetric. Moving all extremities. No gross focal neurologic deficits are appreciated. Skin: Skin is warm, dry and intact. No rash noted. Psychiatric: Mood and affect are normal. Speech and behavior are normal.  ____________________________________________   LABS (all labs ordered are listed, but only abnormal results are displayed)  Labs Reviewed  RESPIRATORY PANEL BY RT PCR (FLU A&B, COVID) - Abnormal; Notable for the following components:      Result Value   SARS Coronavirus 2 by RT PCR POSITIVE (*)    All other components within normal limits  COMPREHENSIVE METABOLIC PANEL - Abnormal; Notable for the following components:   Potassium 5.4 (*)    Glucose, Bld 110 (*)    BUN 33 (*)    Creatinine, Ser 12.67 (*)    GFR calc non Af Amer 4 (*)    GFR calc Af Amer 5 (*)    All other components within normal limits  CBC WITH DIFFERENTIAL/PLATELET - Abnormal; Notable for the following components:   WBC 3.0 (*)    RBC 3.91 (*)    Hemoglobin 11.2 (*)    HCT 34.3 (*)    Platelets 93 (*)    All other components within normal limits  TROPONIN I (HIGH SENSITIVITY) - Abnormal; Notable for the following components:   Troponin I (High Sensitivity) 39 (*)    All other components within normal limits  CULTURE, BLOOD (ROUTINE X 2)  CULTURE, BLOOD (ROUTINE X 2)  LACTIC ACID, PLASMA  PROTIME-INR  URINALYSIS, COMPLETE (UACMP) WITH MICROSCOPIC  TROPONIN I (HIGH SENSITIVITY)   ____________________________________________  EKG  ED ECG REPORT I, Rudene Re, the attending physician, personally viewed and interpreted this ECG.  Normal sinus rhythm, rate of 81, normal  intervals, normal axis, diffuse T wave inversions in inferior and lateral leads, no ST elevation, these are all new when compared to prior. ____________________________________________  RADIOLOGY  I have personally reviewed the images performed during this visit and I agree with the Radiologist's read.   Interpretation by Radiologist:  DG Chest 2 View  Result Date: 01/25/2020 CLINICAL DATA:  Possible sepsis.  Fever EXAM: CHEST - 2 VIEW COMPARISON:  None. FINDINGS: Right chest wall catheter. Distal tip is at the cavoatrial junction. There are mild opacities in the right mid lung. No pleural effusion or pneumothorax. No pulmonary edema. IMPRESSION: Mild right mid lung opacities, likely chronic. Electronically Signed   By: Ulyses Jarred M.D.   On: 01/25/2020 01:45  ____________________________________________   PROCEDURES  Procedure(s) performed:yes .1-3 Lead EKG Interpretation Performed by: Rudene Re, MD Authorized by: Rudene Re, MD     Interpretation: abnormal     ECG rate assessment: normal     Rhythm: sinus rhythm     Ectopy: none     Critical Care performed: yes  CRITICAL CARE Performed by: Rudene Re  ?  Total critical care time: 40 min  Critical care time was exclusive of separately billable procedures and treating other patients.  Critical care was necessary to treat or prevent imminent or life-threatening deterioration.  Critical care was time spent personally by me on the following activities: development of treatment plan with patient and/or surrogate as well as nursing, discussions with consultants, evaluation of patient's response to treatment, examination of patient, obtaining history from patient or surrogate, ordering and performing treatments and interventions, ordering and review of laboratory studies, ordering and review of radiographic studies, pulse oximetry and re-evaluation of patient's  condition.  ____________________________________________   INITIAL IMPRESSION / ASSESSMENT AND PLAN / ED COURSE  44 y.o. male with a history of ESRD on HD (TTS), hypotension on midodrine, GBS, DM, anemia who presents for evaluation of fever and worsening neuropathy x 2 days.  Patient is positive for Covid here.  No respiratory distress, normal work of breathing, normal sats both at rest and with ambulation in the room.  When patient arrived his blood pressure was normal however after being almost 5 hours in the waiting room patient is now hypotensive with BP of 84/51.  Patient is asymptomatic.  He is supposed to be on midodrine but has not been taking it for months.  He was dialyzed yesterday.  Will give dose of midodrine and 500 cc bolus.  Chest x-ray visualized by me showing right-sided opacities, confirmed by radiology.  No indications for emergent dialysis with minimally elevated potassium in the setting of hemolysis.  No signs of sepsis.  Old medical records reviewed.  Patient placed on telemetry for close monitoring.  Will reassess after midodrine and bolus.  _________________________ 5:39 AM on 01/25/2020 -----------------------------------------  EKG is abnormal with diffuse T wave inversions but no ST elevations.  Possibly demand ischemia in the setting of hypotension.  Troponin 39 will get a delta.  Patient denies chest pain or shortness of breath.  Patient is now hypoxic with sats at 87%.  Placed on 3 L nasal cannula.  Will start remdesivir, Solu-Medrol, and admit patient to the hospitalist service.    _____________________________________________ Please note:  Patient was evaluated in Emergency Department today for the symptoms described in the history of present illness. Patient was evaluated in the context of the global COVID-19 pandemic, which necessitated consideration that the patient might be at risk for infection with the SARS-CoV-2 virus that causes COVID-19. Institutional  protocols and algorithms that pertain to the evaluation of patients at risk for COVID-19 are in a state of rapid change based on information released by regulatory bodies including the CDC and federal and state organizations. These policies and algorithms were followed during the patient's care in the ED.  Some ED evaluations and interventions may be delayed as a result of limited staffing during the pandemic.   [place] Controlled Substance Database was reviewed by me. ____________________________________________   FINAL CLINICAL IMPRESSION(S) / ED DIAGNOSES   Final diagnoses:  COVID-19  Acute respiratory failure with hypoxia (HCC)  Abnormal EKG  Demand ischemia ([place])      NEW MEDICATIONS STARTED DURING THIS VISIT:  ED Discharge  Orders    None       Note:  This document was prepared using Dragon voice recognition software and may include unintentional dictation errors.    Alfred Levins, [place], MD 01/25/20 [phone]

## 2020-01-25 NOTE — H&P (Signed)
History and Physical    Patrick Downs JKD:[id number] DOB: May 28, 1975 DOA: 01/25/2020  Referring MD/NP/PA:   PCP: Baxter Hire, MD   Patient coming from:  The patient is coming from home.  At baseline, pt is independent for most of ADL.        Chief Complaint: fever  HPI: Patrick Downs is a 44 y.o. male with medical history significant of ESRD-HD (TTS), hypotension on midodrine, diet-controlled diabetes, gout, depression, Guillain-Barr syndrome, anemia, who presents with fever.  Patient states that he has been having fever and chills for more than 2 days.  He denies sore throat, cough, chest pain, shortness breath.  No nausea, vomiting, diarrhea, abdominal pain, symptoms of UTI or unilateral weakness.  Patient was not vaccinated against Covid19.  Patient states that he has generalized weakness and has not been feeling well.  He missed her dialysis on Thursday.  Patient states that he has chronic peripheral neuropathy, which has worsened, with worse bilateral leg pain in the past several days.  Patient was found to have oxygen desaturation 87% initially, which has improved 94 on 3L O2 in ED.His blood pressure was 82/54, which improved to 109/75 after giving 500 cc LR bolus in ED.   ED Course: pt was found to have positive COVID-19 PCR, WBC 3.0, lactic acid 1.0, INR 1.1, troponin 39 -->37, potassium 5.4, bicarbonate 23, creatinine 12.67, BUN 33, temperature 103.3, tachycardia with heart rate of 117, RR 20, chest x-ray showed mild right middle lobe opacity. Pt is admitted to [place] bed as inpatient.  Dr. Holley Raring of nephrology is consulted for dialysis.    Review of Systems:   General: has fevers, chills, no body weight gain, has fatigue HEENT: no blurry vision, hearing changes or sore throat Respiratory: no dyspnea, coughing, wheezing CV: no chest pain, no palpitations GI: no nausea, vomiting, abdominal pain, diarrhea, constipation GU: no dysuria, burning on urination, increased  urinary frequency, hematuria  Ext: no leg edema Neuro: No vision change or hearing loss. Has peripheral neuropathy. Skin: no rash, no skin tear. MSK: No muscle spasm, no deformity, no limitation of range of movement in spin Heme: No easy bruising.  Travel history: No recent long distant travel.  Allergy:  Allergies  Allergen Reactions  . Ondansetron Other (See Comments)    Stomach pain   . Minoxidil Other (See Comments)    "put fluid around my heart", PERICARDIAL EFFUSION  . Morphine And Related Other (See Comments)    Aggressive   . Omnipaque [Iohexol] Itching and Other (See Comments)    Rigors on one occasion, widespread itching on a separate occasion (resolved with Benadryl), tremors    Past Medical History:  Diagnosis Date  . Depression   . Dialysis patient ([place])   . ESRD (end stage renal disease) on dialysis ([place])   . Failure to thrive in adult   . Gout   . Guillain Barr syndrome ([place])   . HTN (hypertension)   . Kidney failure   . Pneumonia   . Renal insufficiency   . Respiratory failure [facility])     Past Surgical History:  Procedure Laterality Date  . A/V FISTULAGRAM Right 01/07/2018   Procedure: A/V FISTULAGRAM;  Surgeon: Algernon Huxley, MD;  Location: [facility];  Service: Cardiovascular;  Laterality: Right;  . AMPUTATION TOE Right 06/08/2017   Procedure: AMPUTATION TOE RIGHT FIFTH TOE;  Surgeon: Samara Deist, DPM;  Location: [facility];  Service: Podiatry;  Laterality: Right;  . AMPUTATION TOE  Left 05/17/2018   Procedure: RAY LEFT 5TH;  Surgeon: Samara Deist, DPM;  Location: [facility];  Service: Podiatry;  Laterality: Left;  . AV FISTULA PLACEMENT     x5      2 graphs  . DIALYSIS/PERMA CATHETER INSERTION N/A 01/20/2019   Procedure: DIALYSIS/PERMA CATHETER INSERTION;  Surgeon: Algernon Huxley, MD;  Location: [facility];  Service: Cardiovascular;  Laterality: N/A;  . DIALYSIS/PERMA CATHETER REMOVAL N/A 06/17/2018   Procedure: DIALYSIS/PERMA CATHETER  REMOVAL;  Surgeon: Algernon Huxley, MD;  Location: [place] [facility];  Service: Cardiovascular;  Laterality: N/A;  . PARATHYROIDECTOMY    . PERIPHERAL VASCULAR THROMBECTOMY Right 01/22/2019   Procedure: PERIPHERAL VASCULAR THROMBECTOMY;  Surgeon: Algernon Huxley, MD;  Location: [place] [facility];  Service: Cardiovascular;  Laterality: Right;  . RENAL BIOPSY    . REVISON OF ARTERIOVENOUS FISTULA Right 02/07/2018   Procedure: REVISON OF ARTERIOVENOUS FISTULA;  Surgeon: Algernon Huxley, MD;  Location: [facility];  Service: Vascular;  Laterality: Right;  . TEE WITHOUT CARDIOVERSION N/A 01/22/2018   Procedure: TRANSESOPHAGEAL ECHOCARDIOGRAM (TEE);  Surgeon: Dionisio David, MD;  Location: [facility];  Service: Cardiovascular;  Laterality: N/A;  . tonsiilectomy    . tracheotomy      Social History:  reports that he has never smoked. He has never used smokeless tobacco. He reports current drug use. Drug: Marijuana. He reports that he does not drink alcohol.  Family History:  Family History  Problem Relation Age of Onset  . Diabetes Mellitus II Father   . Kidney disease Father   . Kidney failure Paternal Grandfather   . Prostate cancer Neg Hx   . Kidney cancer Neg Hx   . Bladder Cancer Neg Hx      Prior to Admission medications   Medication Sig Start Date End Date Taking? Authorizing Provider  acetaminophen (TYLENOL) 500 MG tablet Take 1,000-1,500 mg by mouth 2 (two) times daily as needed for moderate pain or headache.   Yes [provider]  AURYXIA 1 GM 210 MG(Fe) tablet Take 420 mg by mouth 3 (three) times daily. 11/28/19  Yes [provider]  calcium acetate (PHOSLO) 667 MG capsule Take 3 capsules (2,001 mg total) by mouth 3 (three) times daily with meals. Patient taking differently: Take 1,334-2,001 mg by mouth 3 (three) times daily with meals.  08/26/14  Yes Wieting, Richard, MD  gabapentin (NEURONTIN) 300 MG capsule Take 600 mg by mouth at bedtime.    Yes [provider]   hydrOXYzine (ATARAX/VISTARIL) 25 MG tablet Take 25 mg by mouth 2 (two) times daily as needed. 09/04/19  Yes [provider]  midodrine (PROAMATINE) 5 MG tablet Take 5 mg by mouth See admin instructions. Take 2 tablets (10 mg) by mouth before dialysis on Tuesdays, Thursdays, & Saturday; Take 1 tablet (5 mg) by mouth in the evening on  Tuesdays, Thursdays, & Saturdays. On non-dialysis days take 1 tablet (5 mg) by mouth every 8 hours.   Yes [provider]  oxyCODONE-acetaminophen (PERCOCET) 10-325 MG tablet Take 1 tablet by mouth every 6 (six) hours as needed for pain. 01/12/20 02/11/20 Yes Molli Barrows, MD  sildenafil (REVATIO) 20 MG tablet Take 20-100 mg by mouth as needed (for ED).  03/02/17  Yes [provider]  oxyCODONE-acetaminophen (PERCOCET) 10-325 MG tablet Take 1 tablet by mouth every 6 (six) hours as needed for pain. 02/11/20 03/12/20  Molli Barrows, MD    Physical Exam: Vitals:  01/25/20 0500 01/25/20 0530 01/25/20 0600 01/25/20 0715  BP: (!) 102/25 (!) 101/58 109/75   Pulse: 72 76 72 76  Resp: 13 13 12 14   Temp:      TempSrc:      SpO2: 90% 92% 100% 100%  Weight:      Height:       General: Not in acute distress HEENT:       Eyes: PERRL, EOMI, no scleral icterus.       ENT: No discharge from the ears and nose, no pharynx injection, no tonsillar enlargement.        Neck: No JVD, no bruit, no mass felt. Heme: No neck lymph node enlargement. Cardiac: S1/S2, RRR, No gallops or rubs. Respiratory: No rales, wheezing, rhonchi or rubs. GI: Soft, nondistended, nontender, no rebound pain, no organomegaly, BS present. GU: No hematuria Ext: No pitting leg edema bilaterally. 1+DP/PT pulse bilaterally. Musculoskeletal: No joint deformities, No joint redness or warmth, no limitation of ROM in spin. Skin: No rashes.  Neuro: Alert, oriented X3, cranial nerves II-XII grossly intact. Psych: Patient is not psychotic, no suicidal or hemocidal ideation.  Labs  on Admission: I have personally reviewed following labs and imaging studies  CBC: Recent Labs  Lab 01/25/20 0031  WBC 3.0*  NEUTROABS 1.9  HGB 11.2*  HCT 34.3*  MCV 87.7  PLT 93*   Basic Metabolic Panel: Recent Labs  Lab 01/25/20 0031  NA 135  K 5.4*  CL 98  CO2 23  GLUCOSE 110*  BUN 33*  CREATININE 12.67*  CALCIUM 9.2   GFR: Estimated Creatinine Clearance: 10.2 mL/min (A) (by C-G formula based on SCr of 12.67 mg/dL (H)). Liver Function Tests: Recent Labs  Lab 01/25/20 0031  AST 29  ALT 22  ALKPHOS 41  BILITOT 0.8  PROT 7.8  ALBUMIN 4.1   No results for input(s): LIPASE, AMYLASE in the last 168 hours. No results for input(s): AMMONIA in the last 168 hours. Coagulation Profile: Recent Labs  Lab 01/25/20 0050  INR 1.1   Cardiac Enzymes: No results for input(s): CKTOTAL, CKMB, CKMBINDEX, TROPONINI in the last 168 hours. BNP (last 3 results) No results for input(s): PROBNP in the last 8760 hours. HbA1C: No results for input(s): HGBA1C in the last 72 hours. CBG: No results for input(s): GLUCAP in the last 168 hours. Lipid Profile: No results for input(s): CHOL, HDL, LDLCALC, TRIG, CHOLHDL, LDLDIRECT in the last 72 hours. Thyroid Function Tests: No results for input(s): TSH, T4TOTAL, FREET4, T3FREE, THYROIDAB in the last 72 hours. Anemia Panel: No results for input(s): VITAMINB12, FOLATE, FERRITIN, TIBC, IRON, RETICCTPCT in the last 72 hours. Urine analysis: No results found for: COLORURINE, APPEARANCEUR, LABSPEC, PHURINE, GLUCOSEU, HGBUR, BILIRUBINUR, [place], [place], UROBILINOGEN, NITRITE, LEUKOCYTESUR Sepsis Labs: @LABRCNTIP (procalcitonin:4,lacticidven:4) ) Recent Results (from the past 240 hour(s))  Culture, blood (Routine x 2)     Status: None (Preliminary result)   Collection Time: 01/25/20 12:30 AM   Specimen: BLOOD RIGHT FOREARM  Result Value Ref Range Status   Specimen Description BLOOD RIGHT FOREARM  Final   Special Requests   Final     BOTTLES DRAWN AEROBIC AND ANAEROBIC Blood Culture adequate volume   Culture   Final    NO GROWTH < 12 HOURS Performed at [facility], [address] [place], [place] [address]    Report Status PENDING  Incomplete  Respiratory Panel by RT PCR (Flu A&B, Covid) - Nasopharyngeal Swab     Status: Abnormal   Collection Time:  01/25/20 12:45 AM   Specimen: Nasopharyngeal Swab  Result Value Ref Range Status   SARS Coronavirus 2 by RT PCR POSITIVE (A) NEGATIVE Final    Comment: RESULT CALLED TO, READ BACK BY AND VERIFIED WITH: ASHTON PETERS 01/25/20 AT 0227 HS (NOTE) SARS-CoV-2 target nucleic acids are DETECTED.  SARS-CoV-2 RNA is generally detectable in upper respiratory specimens  during the acute phase of infection. Positive results are indicative of the presence of the identified virus, but do not rule out bacterial infection or co-infection with other pathogens not detected by the test. Clinical correlation with patient history and other diagnostic information is necessary to determine patient infection status. The expected result is Negative.  Fact Sheet for Patients:  PinkCheek.be  Fact Sheet for Healthcare Providers: GravelBags.it  This test is not yet approved or cleared by the [place] FDA and  has been authorized for detection and/or diagnosis of SARS-CoV-2 by FDA under an Emergency Use Authorization (EUA).  This EUA will remain in effect (meaning this test can be [place] ed) for the duration of  the COVID-19 declaration under Section 564(b)(1) of the Act, 21 U.S.C. section 360bbb-3(b)(1), unless the authorization is terminated or revoked sooner.      Influenza A by PCR NEGATIVE NEGATIVE Final   Influenza B by PCR NEGATIVE NEGATIVE Final    Comment: (NOTE) The Xpert Xpress SARS-CoV-2/FLU/RSV assay is intended as an aid in  the diagnosis of influenza from Nasopharyngeal swab specimens and  should not be  used as a sole basis for treatment. Nasal washings and  aspirates are unacceptable for Xpert Xpress SARS-CoV-2/FLU/RSV  testing.  Fact Sheet for Patients: PinkCheek.be  Fact Sheet for Healthcare Providers: GravelBags.it  This test is not yet approved or cleared by the [place] FDA and  has been authorized for detection and/or diagnosis of SARS-CoV-2 by  FDA under an Emergency Use Authorization (EUA). This EUA will remain  in effect (meaning this test can be used) for the duration of the  Covid-19 declaration under Section 564(b)(1) of the Act, 21  U.S.C. section 360bbb-3(b)(1), unless the authorization is  terminated or revoked. Performed at [facility], [address] [place], [place] [address]   Culture, blood (Routine x 2)     Status: None (Preliminary result)   Collection Time: 01/25/20 12:49 AM   Specimen: BLOOD LEFT HAND  Result Value Ref Range Status   Specimen Description BLOOD LEFT HAND  Final   Special Requests   Final    BOTTLES DRAWN AEROBIC AND ANAEROBIC Blood Culture adequate volume   Culture   Final    NO GROWTH < 12 HOURS Performed at [facility], [address] [place], [place] [address]    Report Status PENDING  Incomplete     Radiological Exams on Admission: DG Chest 2 View  Result Date: 01/25/2020 CLINICAL DATA:  Possible sepsis.  Fever EXAM: CHEST - 2 VIEW COMPARISON:  None. FINDINGS: Right chest wall catheter. Distal tip is at the cavoatrial junction. There are mild opacities in the right mid lung. No pleural effusion or pneumothorax. No pulmonary edema. IMPRESSION: Mild right mid lung opacities, likely chronic. Electronically Signed   By: Ulyses Jarred M.D.   On: 01/25/2020 01:45     EKG: Independently reviewed.  Not done in ED, will get one.   Assessment/Plan Principal Problem:   Acute hypoxemic respiratory failure due to COVID-19 [facility]) Active Problems:   ESRD  on dialysis (HCC)   Hypotension   Hyperkalemia  Anemia in ESRD (end-stage renal disease) (HCC)   Sepsis (HCC)   Thrombocytopenia (HCC)   Elevated troponin   Peripheral neuropathy   Acute hypoxemic respiratory failure due to COVID-19 [facility]): Patient has oxygen desaturated to 87% on room air, and needs 3 L oxygen currently.  Chest x-ray showed infiltration in right middle lobe.  -will admit to med-surg bed as inpt -Remdesivir per pharm -Solumedrol 60 mg bid -vitamin C, zinc.  -Bronchodilators -PRN Mucinex for cough -f/u Blood culture -Gentle IV fluid:  -D-dimer, BNP,Trop, LFT, CRP, LDH, Procalcitonin, Ferritin, fibinogen, TG, Hep B SAg, HIV ab -Daily CRP, Ferritin, D-dimer, -Will ask the patient to maintain an awake prone position for 16+ hours a day, if possible, with a minimum of 2-3 hours at a time -Will attempt to maintain euvolemia to a net negative fluid status -IF patient deteriorates, will consult PCCM and ID  Sepsis due to COVID-19 infection: Patient meets criteria for sepsis with tachycardia with heart rates of 117 and WBC 3.0.  Lactic acid is normal.  Initially patient has hypotension, which is likely due to chronic hypotension. --will get Procalcitonin -IVF: received 500 cc of LR ((patient has ESRD, limiting aggressive IV fluids treatment).    ESRD on dialysis (TTS): missed HD on Thursday -Dr. Holley Raring of renal is consulted  Hyperkalemia: Potassium 5.4. -Give leukoma 5 g -Renal is consulted for dialysis  Hypotension: Patient has chronic hypotension on midodrine.  Mental status normal. -Continue midodrine  Anemia in ESRD (end-stage renal disease) ([place]): Hemoglobin stable, 11.2 -Continue iron supplement  Thrombocytopenia ([place]): This seems to be chronic issue.  Platelet 93 today, no active bleeding -Follow-up of CBC  Elevated troponin: Troponin 39, 37.  Denies chest pain.  This is likely due to decreased clearance in the setting of end-stage renal disease. -Check  A1c, FLP -Repeat EKG in morning. -Stop trending troponin.  Peripheral neuropathy: -Continue gabapentin at home Percocet as needed   DVT ppx: SQ Heparin    Code Status: Full code Family Communication:  Yes, patient's mother by phone Disposition Plan:  Anticipate discharge back to previous environment Consults called:  Dr. Holley Raring of renal Admission status: Med-surg bed as inpt        Status is: Inpatient  Remains inpatient appropriate because:Inpatient level of care appropriate due to severity of illness.  Patient has multiple comorbidities, now presents with acute hypoxia respiratory failure due to COVID-19 infection.  Patient has new oxygen requirement.  His presentation is highly complicated.  Patient is at high risk of deteriorating.  Will need to be treated in hospital for at least 2 days.   Dispo: The patient is from: Home              Anticipated d/c is to: Home              Anticipated d/c date is: 2 days              Patient currently is not medically stable to d/c.          Date of Service 01/25/2020    Ivor Costa [facility]   If 7PM-7AM, please contact night-coverage [email or web address] 01/25/2020, 8:50 AM

## 2020-01-25 NOTE — ED Notes (Signed)
Pt does not make urine.

## 2020-01-25 NOTE — ED Triage Notes (Signed)
Patient c/o fever and severe peripheral neuropathy pain (much worse than baseline level). Patient is a T/Th/S dialysis patient; patient went Saturday, missed Thursday this week. Patient denies other complaints.

## 2020-01-25 NOTE — Progress Notes (Signed)
Consulted for missed HD on Thursday.  Pt will need to move to room 17 to be dialyzed today.  Dialysis RN on call notified.  Will plan for HD x 3 hours, BFR 400, DFR 800, UF target 2kg, 2K bath.

## 2020-01-26 LAB — CBC WITH DIFFERENTIAL/PLATELET
Abs Immature Granulocytes: 0.02 10*3/uL (ref 0.00–0.07)
Basophils Absolute: 0 10*3/uL (ref 0.0–0.1)
Basophils Relative: 0 %
Eosinophils Absolute: 0 10*3/uL (ref 0.0–0.5)
Eosinophils Relative: 0 %
HCT: 31 % — ABNORMAL LOW (ref 39.0–52.0)
Hemoglobin: 10 g/dL — ABNORMAL LOW (ref 13.0–17.0)
Immature Granulocytes: 1 %
Lymphocytes Relative: 11 %
Lymphs Abs: 0.5 10*3/uL — ABNORMAL LOW (ref 0.7–4.0)
MCH: 28.5 pg (ref 26.0–34.0)
MCHC: 32.3 g/dL (ref 30.0–36.0)
MCV: 88.3 fL (ref 80.0–100.0)
Monocytes Absolute: 0.3 10*3/uL (ref 0.1–1.0)
Monocytes Relative: 8 %
Neutro Abs: 3.5 10*3/uL (ref 1.7–7.7)
Neutrophils Relative %: 80 %
Platelets: 102 10*3/uL — ABNORMAL LOW (ref 150–400)
RBC: 3.51 MIL/uL — ABNORMAL LOW (ref 4.22–5.81)
RDW: 15 % (ref 11.5–15.5)
WBC: 4.3 10*3/uL (ref 4.0–10.5)
nRBC: 0 % (ref 0.0–0.2)

## 2020-01-26 LAB — HEPATITIS PANEL, ACUTE
HCV Ab: NONREACTIVE
Hep A IgM: NONREACTIVE
Hep B C IgM: NONREACTIVE
Hepatitis B Surface Ag: NONREACTIVE

## 2020-01-26 LAB — HEMOGLOBIN A1C
Hgb A1c MFr Bld: 5.7 % — ABNORMAL HIGH (ref 4.8–5.6)
Mean Plasma Glucose: 116.89 mg/dL

## 2020-01-26 LAB — LIPID PANEL
Cholesterol: 162 mg/dL (ref 0–200)
HDL: 31 mg/dL — ABNORMAL LOW (ref >40)
LDL Cholesterol: 111 mg/dL — ABNORMAL HIGH (ref 0–99)
Total CHOL/HDL Ratio: 5.2 RATIO
Triglycerides: 99 mg/dL (ref <150)
VLDL: 20 mg/dL (ref 0–40)

## 2020-01-26 LAB — C-REACTIVE PROTEIN: CRP: 7.5 mg/dL — ABNORMAL HIGH (ref <1.0)

## 2020-01-26 LAB — PARATHYROID HORMONE, INTACT (NO CA): PTH: 35 pg/mL (ref 15–65)

## 2020-01-26 LAB — FERRITIN: Ferritin: 1481 ng/mL — ABNORMAL HIGH (ref 24–336)

## 2020-01-26 LAB — FIBRIN DERIVATIVES D-DIMER (ARMC ONLY): Fibrin derivatives D-dimer (ARMC): 1034.41 ng/mL (FEU) — ABNORMAL HIGH (ref 0.00–499.00)

## 2020-01-26 MED ORDER — METHYLPREDNISOLONE SODIUM SUCC 125 MG IJ SOLR
60.0000 mg | Freq: Two times a day (BID) | INTRAMUSCULAR | Status: DC
  Administered 2020-01-26 – 2020-01-28 (×5): 60 mg via INTRAVENOUS
  Filled 2020-01-26 (×5): qty 2

## 2020-01-26 MED ORDER — HEPARIN SODIUM (PORCINE) 5000 UNIT/ML IJ SOLN
5000.0000 [IU] | Freq: Three times a day (TID) | INTRAMUSCULAR | Status: DC
  Administered 2020-01-26 – 2020-01-28 (×7): 5000 [IU] via SUBCUTANEOUS
  Filled 2020-01-26 (×7): qty 1

## 2020-01-26 NOTE — Progress Notes (Addendum)
PROGRESS NOTE    Patrick Downs  CWC:[id number] DOB: 1976/03/21 DOA: 01/25/2020 PCP: Baxter Hire, MD    Brief Narrative:  Patrick Downs is a 44 year old male with past medical history notable for ESRD on HD (TTS), hypotension on midodrine, diet-controlled diabetes mellitus, gout, depression, history of Guillain-Barr syndrome, anemia who presented with shortness of breath, fevers and chills for 2 days.  Also associated generalized weakness.  Missed dialysis on Thursday.  Patient is unvaccinated against Covid-19.  In the ED, temperature 103.3, HR 117, RR 20, troponin 39, 37, potassium 5.4, bicarbonate 23, creatinine 12.67, BUN 33, WBC count 3.0, lactic acid 1.0, INR 1.1.  Covid-19 PCR positive.  Chest x-ray with mild right lobe opacity.  Patient was noted to be hypoxic on room air with SPO2 87%.  BP 82/54 improved to 109/75 following 500 mL LR bolus.  Hospitalist service consulted for admission for acute hypoxic respiratory failure secondary to Covid-19 viral pneumonia.   Assessment & Plan:   Principal Problem:   Acute hypoxemic respiratory failure due to COVID-19 [facility]) Active Problems:   ESRD on dialysis ([place])   Hypotension   Hyperkalemia   Anemia in ESRD (end-stage renal disease) (HCC)   Sepsis (HCC)   Thrombocytopenia (HCC)   Elevated troponin   Peripheral neuropathy   Acute hypoxic respiratory failure secondary to acute Covid-19 viral pneumonia during the ongoing 2020/2021 Covid 19 Pandemic - POA Severe sepsis, present on admission Patient presenting to the ED with progressive shortness of breath, fevers/chills.  Patient was noted to be hypoxic on arrival with SPO2 87%.  Patient also hypotensive with BP 82/54 that was responsive to IV fluid bolus.  Additionally, patient was tachycardic with HR 117 and a temperature of 103.3. --COVID test: + 10/3 --CRP 7.2>7.5 --ddimer 761>6073 --Remdesivir, plan 5-day course (Day #2/5) --Continue Solumedrol 60mg  IV BID --prone for  2-3hrs every 12hrs if able --Continue supplemental oxygen, titrate to maintain SPO2 greater than 92% --Continue supportive care with albuterol MDI prn, vitamin C, zinc, Tylenol, antitussives (benzonatate/ Mucinex/Tussionex) --Follow CBC, CMP, D-dimer, ferritin, and CRP daily --Continue airborne/contact isolation precautions for 3 weeks from the day of diagnosis  The treatment plan and use of medications and known side effects were discussed with patient/family. Some of the medications used are based on case reports/anecdotal data.  All other medications being used in the management of COVID-19 based on limited study data.  Complete risks and long-term side effects are unknown, however in the best clinical judgment they seem to be of some benefit.  Patient wanted to proceed with treatment options provided.  ESRD on HD TTS --Nephrology following, appreciate assistance --Underwent HD on 01/25/2020, further per nephrology  Hyperkalemia Potassium 5.4 on arrival, etiology likely secondary to missed dialysis session.  Patient was given Lokelma p.o.  And underwent HD on 01/25/2020. --Continue monitor electrolytes closely daily  Type 2 diabetes mellitus Diet controlled at home.  Hemoglobin A1c 5.7. --Monitor glucose intermittently  Peripheral neuropathy --Gabapentin 600 mg p.o. nightly  Hypotension --Midodrine 5 mg p.o. 3 times daily   DVT prophylaxis: Heparin Code Status: Full code Family Communication: Updated patient extensively at bedside  Disposition Plan:  Status is: Inpatient  Remains inpatient appropriate because:Ongoing diagnostic testing needed not appropriate for outpatient work up, Unsafe d/c plan, IV treatments appropriate due to intensity of illness or inability to take PO and Inpatient level of care appropriate due to severity of illness   Dispo: The patient is from: Home  Anticipated d/c is to: Home              Anticipated d/c date is: 3 days               Patient currently is not medically stable to d/c.  Consultants:   Nephrology  Procedures:   None  Antimicrobials:   None   Subjective: Patient seen and examined bedside, resting comfortably.  No complaints this morning.  States breathing is better but continues on supple oxygen at 3 L per nasal cannula.  Underwent HD yesterday.  Denies headache, no dizziness, no chest pain, palpitations, no abdominal pain, no current fever/chills/night sweats, no nausea/vomiting/diarrhea, no weakness, no paresthesias.  No acute events overnight per nursing staff.  Objective: Vitals:   01/25/20 2111 01/26/20 0523 01/26/20 0726 01/26/20 1148  BP: (!) 137/96 (!) 144/92 (!) 134/99 124/79  Pulse: 79 73 74 72  Resp: 18 18 18 18   Temp: 98.3 F (36.8 C) 98 F (36.7 C) 97.8 F (36.6 C) 97.8 F (36.6 C)  TempSrc: Oral Oral Oral Oral  SpO2: 99% 98% 95% 96%  Weight:      Height:        Intake/Output Summary (Last 24 hours) at 01/26/2020 1150 Last data filed at 01/25/2020 1600 Gross per 24 hour  Intake --  Output 2000 ml  Net -2000 ml   Filed Weights   01/24/20 2359 01/25/20 0034  Weight: 115.7 kg 116 kg    Examination:  General exam: Appears calm and comfortable  Respiratory system: Clear to auscultation. Respiratory effort normal.  On 3 L nasal cannula with SPO2 98% Cardiovascular system: S1 & S2 heard, RRR. No JVD, murmurs, rubs, gallops or clicks. No pedal edema.  Tunneled HD catheter noted right chest with dressing in place clean/dry/intact Gastrointestinal system: Abdomen is nondistended, soft and nontender. No organomegaly or masses felt. Normal bowel sounds heard. Central nervous system: Alert and oriented. No focal neurological deficits. Extremities: Symmetric 5 x 5 power. Skin: No rashes, lesions or ulcers Psychiatry: Judgement and insight appear normal. Mood & affect appropriate.     Data Reviewed: I have personally reviewed following labs and imaging studies  CBC: Recent  Labs  Lab 01/25/20 0031 01/26/20 0531  WBC 3.0* 4.3  NEUTROABS 1.9 3.5  HGB 11.2* 10.0*  HCT 34.3* 31.0*  MCV 87.7 88.3  PLT 93* 606*   Basic Metabolic Panel: Recent Labs  Lab 01/25/20 0031 01/25/20 0944  NA 135  --   K 5.4*  --   CL 98  --   CO2 23  --   GLUCOSE 110*  --   BUN 33*  --   CREATININE 12.67*  --   CALCIUM 9.2  --   PHOS  --  4.0   GFR: Estimated Creatinine Clearance: 10.2 mL/min (A) (by C-G formula based on SCr of 12.67 mg/dL (H)). Liver Function Tests: Recent Labs  Lab 01/25/20 0031  AST 29  ALT 22  ALKPHOS 41  BILITOT 0.8  PROT 7.8  ALBUMIN 4.1   No results for input(s): LIPASE, AMYLASE in the last 168 hours. No results for input(s): AMMONIA in the last 168 hours. Coagulation Profile: Recent Labs  Lab 01/25/20 0050  INR 1.1   Cardiac Enzymes: No results for input(s): CKTOTAL, CKMB, CKMBINDEX, TROPONINI in the last 168 hours. BNP (last 3 results) No results for input(s): PROBNP in the last 8760 hours. HbA1C: Recent Labs    01/26/20 0531  HGBA1C 5.7*   CBG: No  results for input(s): GLUCAP in the last 168 hours. Lipid Profile: Recent Labs    01/25/20 0944 01/26/20 0531  CHOL  --  162  HDL  --  31*  LDLCALC  --  111*  TRIG 163* 99  CHOLHDL  --  5.2   Thyroid Function Tests: No results for input(s): TSH, T4TOTAL, FREET4, T3FREE, THYROIDAB in the last 72 hours. Anemia Panel: Recent Labs    01/25/20 0944 01/26/20 0531  FERRITIN 1,368* 1,481*   Sepsis Labs: Recent Labs  Lab 01/25/20 0031 01/25/20 0944  PROCALCITON  --  1.74  LATICACIDVEN 1.0  --     Recent Results (from the past 240 hour(s))  Culture, blood (Routine x 2)     Status: None (Preliminary result)   Collection Time: 01/25/20 12:30 AM   Specimen: BLOOD RIGHT FOREARM  Result Value Ref Range Status   Specimen Description BLOOD RIGHT FOREARM  Final   Special Requests   Final    BOTTLES DRAWN AEROBIC AND ANAEROBIC Blood Culture adequate volume   Culture    Final    NO GROWTH 1 DAY Performed at [facility], [address] [place], [place] [address]    Report Status PENDING  Incomplete  Respiratory Panel by RT PCR (Flu A&B, Covid) - Nasopharyngeal Swab     Status: Abnormal   Collection Time: 01/25/20 12:45 AM   Specimen: Nasopharyngeal Swab  Result Value Ref Range Status   SARS Coronavirus 2 by RT PCR POSITIVE (A) NEGATIVE Final    Comment: RESULT CALLED TO, READ BACK BY AND VERIFIED WITH: ASHTON PETERS 01/25/20 AT 0227 HS (NOTE) SARS-CoV-2 target nucleic acids are DETECTED.  SARS-CoV-2 RNA is generally detectable in upper respiratory specimens  during the acute phase of infection. Positive results are indicative of the presence of the identified virus, but do not rule out bacterial infection or co-infection with other pathogens not detected by the test. Clinical correlation with patient history and other diagnostic information is necessary to determine patient infection status. The expected result is Negative.  Fact Sheet for Patients:  PinkCheek.be  Fact Sheet for Healthcare Providers: GravelBags.it  This test is not yet approved or cleared by the [place] FDA and  has been authorized for detection and/or diagnosis of SARS-CoV-2 by FDA under an Emergency Use Authorization (EUA).  This EUA will remain in effect (meaning this test can be [place] ed) for the duration of  the COVID-19 declaration under Section 564(b)(1) of the Act, 21 U.S.C. section 360bbb-3(b)(1), unless the authorization is terminated or revoked sooner.      Influenza A by PCR NEGATIVE NEGATIVE Final   Influenza B by PCR NEGATIVE NEGATIVE Final    Comment: (NOTE) The Xpert Xpress SARS-CoV-2/FLU/RSV assay is intended as an aid in  the diagnosis of influenza from Nasopharyngeal swab specimens and  should not be used as a sole basis for treatment. Nasal washings and  aspirates are unacceptable  for Xpert Xpress SARS-CoV-2/FLU/RSV  testing.  Fact Sheet for Patients: PinkCheek.be  Fact Sheet for Healthcare Providers: GravelBags.it  This test is not yet approved or cleared by the [place] FDA and  has been authorized for detection and/or diagnosis of SARS-CoV-2 by  FDA under an Emergency Use Authorization (EUA). This EUA will remain  in effect (meaning this test can be used) for the duration of the  Covid-19 declaration under Section 564(b)(1) of the Act, 21  U.S.C. section 360bbb-3(b)(1), unless the authorization is  terminated or revoked. Performed at [facility], [address] [place], [place] [address]   Culture, blood (Routine x 2)     Status: None (Preliminary result)   Collection Time: 01/25/20 12:49 AM   Specimen: BLOOD LEFT HAND  Result Value Ref Range Status   Specimen Description BLOOD LEFT HAND  Final   Special Requests   Final    BOTTLES DRAWN AEROBIC AND ANAEROBIC Blood Culture adequate volume   Culture   Final    NO GROWTH 1 DAY Performed at [facility], [address], [place], [place] [address]    Report Status PENDING  Incomplete         Radiology Studies: DG Chest 2 View  Result Date: 01/25/2020 CLINICAL DATA:  Possible sepsis.  Fever EXAM: CHEST - 2 VIEW COMPARISON:  None. FINDINGS: Right chest wall catheter. Distal tip is at the cavoatrial junction. There are mild opacities in the right mid lung. No pleural effusion or pneumothorax. No pulmonary edema. IMPRESSION: Mild right mid lung opacities, likely chronic. Electronically Signed   By: Ulyses Jarred M.D.   On: 01/25/2020 01:45        Scheduled Meds: . vitamin C  500 mg Oral Daily  . calcium acetate  1,334-2,001 mg Oral TID WC  . Chlorhexidine Gluconate Cloth  6 each Topical Q0600  . ferric citrate  420 mg Oral TID  . gabapentin  600 mg Oral QHS  . heparin injection (subcutaneous)  5,000 Units  Subcutaneous Q8H  . ipratropium  2 puff Inhalation Q6H  . mouth rinse  15 mL Mouth Rinse BID  . methylPREDNISolone (SOLU-MEDROL) injection  60 mg Intravenous Q12H  . midodrine  5 mg Oral TID WC  . zinc sulfate  220 mg Oral Daily   Continuous Infusions: . sodium chloride    . sodium chloride    . remdesivir 100 mg in NS 100 mL 100 mg (01/26/20 1005)     LOS: 1 day    Time spent: 36 minutes spent on chart review, discussion with nursing staff, consultants, updating family and interview/physical exam; more than 50% of that time was spent in counseling and/or coordination of care.    Karrie Fluellen J [place] DO [facility] Available via Epic secure chat 7am-7pm After these hours, please refer to coverage provider listed on [email or web address] 01/26/2020, 11:50 AM

## 2020-01-26 NOTE — TOC Initial Note (Signed)
Transition of Care [facility]) - Initial/Assessment Note    Patient Details  Name: Patrick Downs MRN: [id number] Date of Birth: 1976/03/13  Transition of [facility] [place] [facility]) CM/SW Contact:    Patrick Price, RN Phone Number: 01/26/2020, 1:44 PM  Clinical Narrative:       01/26/20 230 pm. Spoke with patient by phone in his room. Oriented and conversant. Feeling better. O2 3 L/[place], Day 2/4 of remdesivir. dialysis Sunday 10/3, as he missed last Thursday and "potassium was up".   Goal of this admission: "I want to be Covid negative".   Pt was independent and drives self to dialysis prior to admission. Feels he will be able to do the same after discharge. Dialysis at [facility] facility has a Covid shift he will attend. Nephrology following. Regular schedule is Tues/Thurs/Sat.   Contacts updated, PCP Patrick Downs is current.  Pharmacy: [facility] [place], [address] AT [facility] [address] [facility] Transportation: Patrick Downs, mother, is contact person and is a potential ride home depending on time of day as she works.  No further questions, CM  will follow up closer to discharge.  Patrick Davies RN CM                   Patient Goals and CMS Choice        Expected Discharge Plan and Services                                                Prior Living Arrangements/Services                       Activities of Daily Living Home Assistive Devices/Equipment: Brace (specify type) (foot drop) ADL Screening (condition at time of admission) Patient's cognitive ability adequate to safely complete daily activities?: Yes Is the patient deaf or have difficulty hearing?: No Does the patient have difficulty seeing, even when wearing glasses/contacts?: No Does the patient have difficulty concentrating, remembering, or making decisions?: No Patient able to express need for assistance with ADLs?: Yes Does the patient have difficulty dressing or bathing?:  No Independently performs ADLs?: Yes (appropriate for developmental age) Does the patient have difficulty walking or climbing stairs?: No Weakness of Legs: None Weakness of Arms/Hands: None  Permission Sought/Granted                  Emotional Assessment              Admission diagnosis:  Abnormal EKG [R94.31] Demand ischemia ([place]) [I24.8] Acute respiratory failure with hypoxia ([place]) [J96.01] Acute hypoxemic respiratory failure due to COVID-19 ([place]) [U07.1, J96.01] COVID-19 [U07.1] Patient Active Problem List   Diagnosis Date Noted  . Acute hypoxemic respiratory failure due to COVID-19 ([place]) 01/25/2020  . Sepsis ([place]) 01/25/2020  . Thrombocytopenia ([place]) 01/25/2020  . Elevated troponin 01/25/2020  . Peripheral neuropathy 01/25/2020  . Sepsis without acute organ dysfunction ([place]) 08/10/2019  . Murmur 02/19/2019  . GERD (gastroesophageal reflux disease) 03/01/2018  . Degenerative disc disease, lumbar 01/09/2018  . HTN (hypertension) 12/28/2017  . Hyperkalemia 08/16/2017  . Erectile dysfunction due to diseases classified elsewhere 05/31/2016  . Polyneuropathy, unspecified 08/02/2015  . Anemia in ESRD (end-stage renal disease) ([place]) 08/02/2015  . Gastroenteritis 08/02/2015  . Heart failure, unspecified ([place]) 08/02/2015  .  Secondary hyperparathyroidism of renal origin ([place]) 08/02/2015  . Encounter for fitting of gastrointestinal device 06/04/2015  . Idiopathic chronic hypotension 12/11/2014  . Rectal pain 11/27/2014  . Other specified diseases of anus and rectum 11/27/2014  . Encounter for imaging study to confirm nasogastric (NG) tube placement   . Encounter for nasogastric (NG) tube placement   . Respiratory failure, acute ([place])   . Guillain Barr syndrome, lower ext weakness 08/27/2014  . CAP (community acquired pneumonia) 08/23/2014  . ESRD on dialysis ([place]) 08/23/2014  . 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 ARDS (adult respiratory distress syndrome) ([place])  08/23/2014  . Septic shock ([place]) 08/23/2014  . Hypotension 08/23/2014  . Acute respiratory failure ([place]) 08/23/2014  . Fever   . Type 2 diabetes mellitus ([place]) 07/01/2014  . Type 2 diabetes mellitus with diabetic polyneuropathy ([place]) 07/01/2014  . Photophobia 12/11/2013  . Spells 10/30/2013  . Dizziness 10/30/2013  . Other mechanical complication of surgically created arteriovenous fistula, initial encounter ([place]) 02/19/2013  . Foot drop 12/09/2012  . History of Guillain-Barre syndrome 12/09/2012   PCP:  Patrick Hire, MD Pharmacy:   [facility] [phone] [address], [place] AT [place] [id number] Phone: [phone] Fax: [phone]     Social Determinants of Health (SDOH) Interventions    Readmission Risk Interventions No flowsheet data found.

## 2020-01-27 LAB — CBC WITH DIFFERENTIAL/PLATELET
Abs Immature Granulocytes: 0.15 10*3/uL — ABNORMAL HIGH (ref 0.00–0.07)
Basophils Absolute: 0 10*3/uL (ref 0.0–0.1)
Basophils Relative: 0 %
Eosinophils Absolute: 0 10*3/uL (ref 0.0–0.5)
Eosinophils Relative: 0 %
HCT: 31.6 % — ABNORMAL LOW (ref 39.0–52.0)
Hemoglobin: 10.9 g/dL — ABNORMAL LOW (ref 13.0–17.0)
Immature Granulocytes: 1 %
Lymphocytes Relative: 7 %
Lymphs Abs: 0.8 10*3/uL (ref 0.7–4.0)
MCH: 29.9 pg (ref 26.0–34.0)
MCHC: 34.5 g/dL (ref 30.0–36.0)
MCV: 86.8 fL (ref 80.0–100.0)
Monocytes Absolute: 0.6 10*3/uL (ref 0.1–1.0)
Monocytes Relative: 5 %
Neutro Abs: 9.7 10*3/uL — ABNORMAL HIGH (ref 1.7–7.7)
Neutrophils Relative %: 87 %
Platelets: 145 10*3/uL — ABNORMAL LOW (ref 150–400)
RBC: 3.64 MIL/uL — ABNORMAL LOW (ref 4.22–5.81)
RDW: 14.8 % (ref 11.5–15.5)
WBC: 11.3 10*3/uL — ABNORMAL HIGH (ref 4.0–10.5)
nRBC: 0 % (ref 0.0–0.2)

## 2020-01-27 LAB — COMPREHENSIVE METABOLIC PANEL
ALT: 18 U/L (ref 0–44)
AST: 24 U/L (ref 15–41)
Albumin: 4 g/dL (ref 3.5–5.0)
Alkaline Phosphatase: 56 U/L (ref 38–126)
Anion gap: 19 — ABNORMAL HIGH (ref 5–15)
BUN: 76 mg/dL — ABNORMAL HIGH (ref 6–20)
CO2: 26 mmol/L (ref 22–32)
Calcium: 8.1 mg/dL — ABNORMAL LOW (ref 8.9–10.3)
Chloride: 94 mmol/L — ABNORMAL LOW (ref 98–111)
Creatinine, Ser: 13.75 mg/dL — ABNORMAL HIGH (ref 0.61–1.24)
GFR calc Af Amer: 4 mL/min — ABNORMAL LOW (ref >60)
GFR calc non Af Amer: 4 mL/min — ABNORMAL LOW (ref >60)
Glucose, Bld: 203 mg/dL — ABNORMAL HIGH (ref 70–99)
Potassium: 4.8 mmol/L (ref 3.5–5.1)
Sodium: 139 mmol/L (ref 135–145)
Total Bilirubin: 0.7 mg/dL (ref 0.3–1.2)
Total Protein: 7.5 g/dL (ref 6.5–8.1)

## 2020-01-27 LAB — C-REACTIVE PROTEIN: CRP: 5.6 mg/dL — ABNORMAL HIGH (ref <1.0)

## 2020-01-27 LAB — FIBRIN DERIVATIVES D-DIMER (ARMC ONLY): Fibrin derivatives D-dimer (ARMC): 1089.56 ng/mL (FEU) — ABNORMAL HIGH (ref 0.00–499.00)

## 2020-01-27 LAB — PHOSPHORUS: Phosphorus: 1.9 mg/dL — ABNORMAL LOW (ref 2.5–4.6)

## 2020-01-27 LAB — FERRITIN: Ferritin: 1771 ng/mL — ABNORMAL HIGH (ref 24–336)

## 2020-01-27 MED ORDER — DOCUSATE SODIUM 100 MG PO CAPS
100.0000 mg | ORAL_CAPSULE | Freq: Two times a day (BID) | ORAL | Status: DC
  Administered 2020-01-27 – 2020-01-28 (×3): 100 mg via ORAL
  Filled 2020-01-27 (×3): qty 1

## 2020-01-27 NOTE — Progress Notes (Signed)
PROGRESS NOTE    Patrick Downs  OTL:[id number] DOB: Sep 01, 1975 DOA: 01/25/2020 PCP: Baxter Hire, MD    Brief Narrative:  Patrick Downs is a 44 year old male with past medical history notable for ESRD on HD (TTS), hypotension on midodrine, diet-controlled diabetes mellitus, gout, depression, history of Guillain-Barr syndrome, anemia who presented with shortness of breath, fevers and chills for 2 days.  Also associated generalized weakness.  Missed dialysis on Thursday.  Patient is unvaccinated against Covid-19.  In the ED, temperature 103.3, HR 117, RR 20, troponin 39, 37, potassium 5.4, bicarbonate 23, creatinine 12.67, BUN 33, WBC count 3.0, lactic acid 1.0, INR 1.1.  Covid-19 PCR positive.  Chest x-ray with mild right lobe opacity.  Patient was noted to be hypoxic on room air with SPO2 87%.  BP 82/54 improved to 109/75 following 500 mL LR bolus.  Hospitalist service consulted for admission for acute hypoxic respiratory failure secondary to Covid-19 viral pneumonia.   Assessment & Plan:   Principal Problem:   Acute hypoxemic respiratory failure due to COVID-19 [facility]) Active Problems:   ESRD on dialysis ([place])   Hypotension   Hyperkalemia   Anemia in ESRD (end-stage renal disease) (HCC)   Sepsis (HCC)   Thrombocytopenia (HCC)   Elevated troponin   Peripheral neuropathy   Acute hypoxic respiratory failure secondary to acute Covid-19 viral pneumonia during the ongoing 2020/2021 Covid 19 Pandemic - POA Severe sepsis, present on admission Patient presenting to the ED with progressive shortness of breath, fevers/chills.  Patient was noted to be hypoxic on arrival with SPO2 87%.  Patient also hypotensive with BP 82/54 that was responsive to IV fluid bolus.  Additionally, patient was tachycardic with HR 117 and a temperature of 103.3. --COVID test: + 10/3 --CRP 7.2>7.5>5.6 --ddimer [phone] --Remdesivir, plan 5-day course (Day #3/5) --Continue Solumedrol 60mg  IV BID --prone  for 2-3hrs every 12hrs if able --Continue supplemental oxygen, titrate to maintain SPO2 greater than 92% --Continue supportive care with albuterol MDI prn, vitamin C, zinc, Tylenol, antitussives  --Follow CBC, CMP, D-dimer, ferritin, and CRP daily --Continue airborne/contact isolation precautions for 3 weeks from the day of diagnosis  The treatment plan and use of medications and known side effects were discussed with patient/family. Some of the medications used are based on case reports/anecdotal data.  All other medications being used in the management of COVID-19 based on limited study data.  Complete risks and long-term side effects are unknown, however in the best clinical judgment they seem to be of some benefit.  Patient wanted to proceed with treatment options provided.  ESRD on HD TTS --Nephrology following, appreciate assistance --Underwent HD on 01/25/2020, plan HD again today  Hyperkalemia Potassium 5.4 on arrival, etiology likely secondary to missed dialysis session.  Patient was given Lokelma p.o.  And underwent HD on 01/25/2020. --K 4.8 this am --Continue monitor electrolytes closely daily  Type 2 diabetes mellitus Diet controlled at home.  Hemoglobin A1c 5.7. --Monitor glucose intermittently  Peripheral neuropathy --Gabapentin 600 mg p.o. nightly  Hx Hypotension --Midodrine 5 mg p.o. 3 times daily   DVT prophylaxis: Heparin Code Status: Full code Family Communication: Updated patient extensively at bedside  Disposition Plan:  Status is: Inpatient  Remains inpatient appropriate because:Ongoing diagnostic testing needed not appropriate for outpatient work up, Unsafe d/c plan, IV treatments appropriate due to intensity of illness or inability to take PO and Inpatient level of care appropriate due to severity of illness   Dispo: The patient is from: Home  Anticipated d/c is to: Home              Anticipated d/c date is: 2 days              Patient  currently is not medically stable to d/c.  Consultants:   Nephrology  Procedures:   None  Antimicrobials:   None   Subjective: Patient seen and examined bedside, resting comfortably.  Would like to know where his breakfast is this morning.  Also requests stool softener.  No other complaints this morning.  States breathing is better but continues on supple oxygen at 3 L per nasal cannula.  Denies headache, no dizziness, no chest pain, palpitations, no abdominal pain, no current fever/chills/night sweats, no nausea/vomiting/diarrhea, no weakness, no paresthesias.  No acute events overnight per nursing staff.  Objective: Vitals:   01/26/20 2331 01/27/20 0513 01/27/20 0727 01/27/20 1000  BP: 120/78 137/79 (!) 140/100   Pulse: 60 68 65   Resp: 18 18 12 13   Temp: 97.7 F (36.5 C) 97.7 F (36.5 C) 97.8 F (36.6 C)   TempSrc:  Oral Oral   SpO2: 100% 99% 99%   Weight:      Height:        Intake/Output Summary (Last 24 hours) at 01/27/2020 1237 Last data filed at 01/27/2020 0300 Gross per 24 hour  Intake 200 ml  Output --  Net 200 ml   Filed Weights   01/24/20 2359 01/25/20 0034  Weight: 115.7 kg 116 kg    Examination:  General exam: Appears calm and comfortable  Respiratory system: Clear to auscultation. Respiratory effort normal.  On 3 L nasal cannula with SPO2 99% Cardiovascular system: S1 & S2 heard, RRR. No JVD, murmurs, rubs, gallops or clicks. No pedal edema.  Tunneled HD catheter noted right chest with dressing in place clean/dry/intact Gastrointestinal system: Abdomen is nondistended, soft and nontender. No organomegaly or masses felt. Normal bowel sounds heard. Central nervous system: Alert and oriented. No focal neurological deficits. Extremities: Symmetric 5 x 5 power. Skin: No rashes, lesions or ulcers Psychiatry: Judgement and insight appear normal. Mood & affect appropriate.     Data Reviewed: I have personally reviewed following labs and imaging  studies  CBC: Recent Labs  Lab 01/25/20 0031 01/26/20 0531 01/27/20 0511  WBC 3.0* 4.3 11.3*  NEUTROABS 1.9 3.5 9.7*  HGB 11.2* 10.0* 10.9*  HCT 34.3* 31.0* 31.6*  MCV 87.7 88.3 86.8  PLT 93* 102* 814*   Basic Metabolic Panel: Recent Labs  Lab 01/25/20 0031 01/25/20 0944 01/27/20 0511  NA 135  --  139  K 5.4*  --  4.8  CL 98  --  94*  CO2 23  --  26  GLUCOSE 110*  --  203*  BUN 33*  --  76*  CREATININE 12.67*  --  13.75*  CALCIUM 9.2  --  8.1*  PHOS  --  4.0  --    GFR: Estimated Creatinine Clearance: 9.4 mL/min (A) (by C-G formula based on SCr of 13.75 mg/dL (H)). Liver Function Tests: Recent Labs  Lab 01/25/20 0031 01/27/20 0511  AST 29 24  ALT 22 18  ALKPHOS 41 56  BILITOT 0.8 0.7  PROT 7.8 7.5  ALBUMIN 4.1 4.0   No results for input(s): LIPASE, AMYLASE in the last 168 hours. No results for input(s): AMMONIA in the last 168 hours. Coagulation Profile: Recent Labs  Lab 01/25/20 0050  INR 1.1   Cardiac Enzymes: No results for input(s): CKTOTAL,  CKMB, CKMBINDEX, TROPONINI in the last 168 hours. BNP (last 3 results) No results for input(s): PROBNP in the last 8760 hours. HbA1C: Recent Labs    01/26/20 0531  HGBA1C 5.7*   CBG: No results for input(s): GLUCAP in the last 168 hours. Lipid Profile: Recent Labs    01/25/20 0944 01/26/20 0531  CHOL  --  162  HDL  --  31*  LDLCALC  --  111*  TRIG 163* 99  CHOLHDL  --  5.2   Thyroid Function Tests: No results for input(s): TSH, T4TOTAL, FREET4, T3FREE, THYROIDAB in the last 72 hours. Anemia Panel: Recent Labs    01/26/20 0531 01/27/20 0511  FERRITIN 1,481* 1,771*   Sepsis Labs: Recent Labs  Lab 01/25/20 0031 01/25/20 0944  PROCALCITON  --  1.74  LATICACIDVEN 1.0  --     Recent Results (from the past 240 hour(s))  Culture, blood (Routine x 2)     Status: None (Preliminary result)   Collection Time: 01/25/20 12:30 AM   Specimen: BLOOD RIGHT FOREARM  Result Value Ref Range Status    Specimen Description BLOOD RIGHT FOREARM  Final   Special Requests   Final    BOTTLES DRAWN AEROBIC AND ANAEROBIC Blood Culture adequate volume   Culture   Final    NO GROWTH 2 DAYS Performed at [facility], [address], [place], [place] [address]    Report Status PENDING  Incomplete  Respiratory Panel by RT PCR (Flu A&B, Covid) - Nasopharyngeal Swab     Status: Abnormal   Collection Time: 01/25/20 12:45 AM   Specimen: Nasopharyngeal Swab  Result Value Ref Range Status   SARS Coronavirus 2 by RT PCR POSITIVE (A) NEGATIVE Final    Comment: RESULT CALLED TO, READ BACK BY AND VERIFIED WITH: ASHTON PETERS 01/25/20 AT 0227 HS (NOTE) SARS-CoV-2 target nucleic acids are DETECTED.  SARS-CoV-2 RNA is generally detectable in upper respiratory specimens  during the acute phase of infection. Positive results are indicative of the presence of the identified virus, but do not rule out bacterial infection or co-infection with other pathogens not detected by the test. Clinical correlation with patient history and other diagnostic information is necessary to determine patient infection status. The expected result is Negative.  Fact Sheet for Patients:  PinkCheek.be  Fact Sheet for Healthcare Providers: GravelBags.it  This test is not yet approved or cleared by the [place] FDA and  has been authorized for detection and/or diagnosis of SARS-CoV-2 by FDA under an Emergency Use Authorization (EUA).  This EUA will remain in effect (meaning this test can be [place] ed) for the duration of  the COVID-19 declaration under Section 564(b)(1) of the Act, 21 U.S.C. section 360bbb-3(b)(1), unless the authorization is terminated or revoked sooner.      Influenza A by PCR NEGATIVE NEGATIVE Final   Influenza B by PCR NEGATIVE NEGATIVE Final    Comment: (NOTE) The Xpert Xpress SARS-CoV-2/FLU/RSV assay is intended as an aid in  the  diagnosis of influenza from Nasopharyngeal swab specimens and  should not be used as a sole basis for treatment. Nasal washings and  aspirates are unacceptable for Xpert Xpress SARS-CoV-2/FLU/RSV  testing.  Fact Sheet for Patients: PinkCheek.be  Fact Sheet for Healthcare Providers: GravelBags.it  This test is not yet approved or cleared by the [place] FDA and  has been authorized for detection and/or diagnosis of SARS-CoV-2 by  FDA under an Emergency Use Authorization (EUA). This EUA will remain  in  effect (meaning this test can be used) for the duration of the  Covid-19 declaration under Section 564(b)(1) of the Act, 21  U.S.C. section 360bbb-3(b)(1), unless the authorization is  terminated or revoked. Performed at [facility], [address] [place], [place] [address]   Culture, blood (Routine x 2)     Status: None (Preliminary result)   Collection Time: 01/25/20 12:49 AM   Specimen: BLOOD LEFT HAND  Result Value Ref Range Status   Specimen Description BLOOD LEFT HAND  Final   Special Requests   Final    BOTTLES DRAWN AEROBIC AND ANAEROBIC Blood Culture adequate volume   Culture   Final    NO GROWTH 2 DAYS Performed at [facility], [address] [place], [place] [address]    Report Status PENDING  Incomplete         Radiology Studies: No results found.      Scheduled Meds: . vitamin C  500 mg Oral Daily  . calcium acetate  1,334-2,001 mg Oral TID WC  . Chlorhexidine Gluconate Cloth  6 each Topical Q0600  . docusate sodium  100 mg Oral BID  . ferric citrate  420 mg Oral TID  . gabapentin  600 mg Oral QHS  . heparin injection (subcutaneous)  5,000 Units Subcutaneous Q8H  . ipratropium  2 puff Inhalation Q6H  . mouth rinse  15 mL Mouth Rinse BID  . methylPREDNISolone (SOLU-MEDROL) injection  60 mg Intravenous Q12H  . midodrine  5 mg Oral TID WC  . zinc sulfate  220 mg Oral  Daily   Continuous Infusions: . sodium chloride    . sodium chloride    . remdesivir 100 mg in NS 100 mL 100 mg (01/27/20 0816)     LOS: 2 days    Time spent: 35 minutes spent on chart review, discussion with nursing staff, consultants, updating family and interview/physical exam; more than 50% of that time was spent in counseling and/or coordination of care.    Beverlie Kurihara J [place] DO [facility] Available via Epic secure chat 7am-7pm After these hours, please refer to coverage provider listed on [email or web address] 01/27/2020, 12:37 PM

## 2020-01-27 NOTE — Progress Notes (Signed)
[facility]  ROUNDING NOTE   Subjective:   Mr. Baiz is 44 years old gentleman with past medical history of end-stage renal disease on HD TTS, hypertension on midodrine, diabetes, gout, anemia who presented with shortness of breath, fever, and chills for 2 days patient got diagnosed with COVID-19 pneumonia. Patient is for dialysis treatment today.  He appears in no acute distress, denies shortness of breath, reports intermittent dry cough.  Objective:  Vital signs in last 24 hours:  Temp:  [97.6 F (36.4 C)-97.8 F (36.6 C)] 97.8 F (36.6 C) (10/05 0727) Pulse Rate:  [60-72] 65 (10/05 0727) Resp:  [12-20] 13 (10/05 1000) BP: (114-140)/(72-100) 140/100 (10/05 0727) SpO2:  [96 %-100 %] 99 % (10/05 0727)  Weight change:  Filed Weights   01/24/20 2359 01/25/20 0034  Weight: 115.7 kg 116 kg    Intake/Output: I/O last 3 completed shifts: In: 200 [IV Piggyback:200] Out: -    Intake/Output this shift:  No intake/output data recorded.  Physical Exam: General:  Resting in bed in no acute distress  Head: Normocephalic, atraumatic. Moist oral mucosal membranes  Eyes: Anicteric  Neck: Supple, trachea midline  Lungs:  Clear to auscultation, normal and symmetric respiratory effort  Heart: Regular rate and rhythm, HR in 60s  Abdomen:  Soft, nontender,   Extremities:  Trace peripheral edema.  Neurologic: Nonfocal, moving all four extremities  Skin: No acute lesions or rashes  Access: Rt.Chest Permcath    Basic Metabolic Panel: Recent Labs  Lab 01/25/20 0031 01/25/20 0944 01/27/20 0511  NA 135  --  139  K 5.4*  --  4.8  CL 98  --  94*  CO2 23  --  26  GLUCOSE 110*  --  203*  BUN 33*  --  76*  CREATININE 12.67*  --  13.75*  CALCIUM 9.2  --  8.1*  PHOS  --  4.0  --     Liver Function Tests: Recent Labs  Lab 01/25/20 0031 01/27/20 0511  AST 29 24  ALT 22 18  ALKPHOS 41 56  BILITOT 0.8 0.7  PROT 7.8 7.5  ALBUMIN 4.1 4.0   No results for input(s):  LIPASE, AMYLASE in the last 168 hours. No results for input(s): AMMONIA in the last 168 hours.  CBC: Recent Labs  Lab 01/25/20 0031 01/26/20 0531 01/27/20 0511  WBC 3.0* 4.3 11.3*  NEUTROABS 1.9 3.5 9.7*  HGB 11.2* 10.0* 10.9*  HCT 34.3* 31.0* 31.6*  MCV 87.7 88.3 86.8  PLT 93* 102* 145*    Cardiac Enzymes: No results for input(s): CKTOTAL, CKMB, CKMBINDEX, TROPONINI in the last 168 hours.  BNP: Invalid input(s): POCBNP  CBG: No results for input(s): GLUCAP in the last 168 hours.  Microbiology: Results for orders placed or performed during the hospital encounter of 01/25/20  Culture, blood (Routine x 2)     Status: None (Preliminary result)   Collection Time: 01/25/20 12:30 AM   Specimen: BLOOD RIGHT FOREARM  Result Value Ref Range Status   Specimen Description BLOOD RIGHT FOREARM  Final   Special Requests   Final    BOTTLES DRAWN AEROBIC AND ANAEROBIC Blood Culture adequate volume   Culture   Final    NO GROWTH 2 DAYS Performed at [facility], [address] [place], [place] [address]    Report Status PENDING  Incomplete  Respiratory Panel by RT PCR (Flu A&B, Covid) - Nasopharyngeal Swab     Status: Abnormal   Collection Time: 01/25/20 12:45  AM   Specimen: Nasopharyngeal Swab  Result Value Ref Range Status   SARS Coronavirus 2 by RT PCR POSITIVE (A) NEGATIVE Final    Comment: RESULT CALLED TO, READ BACK BY AND VERIFIED WITH: ASHTON PETERS 01/25/20 AT 0227 HS (NOTE) SARS-CoV-2 target nucleic acids are DETECTED.  SARS-CoV-2 RNA is generally detectable in upper respiratory specimens  during the acute phase of infection. Positive results are indicative of the presence of the identified virus, but do not rule out bacterial infection or co-infection with other pathogens not detected by the test. Clinical correlation with patient history and other diagnostic information is necessary to determine patient infection status. The expected result is  Negative.  Fact Sheet for Patients:  PinkCheek.be  Fact Sheet for Healthcare Providers: GravelBags.it  This test is not yet approved or cleared by the [place] FDA and  has been authorized for detection and/or diagnosis of SARS-CoV-2 by FDA under an Emergency Use Authorization (EUA).  This EUA will remain in effect (meaning this test can be [place] ed) for the duration of  the COVID-19 declaration under Section 564(b)(1) of the Act, 21 U.S.C. section 360bbb-3(b)(1), unless the authorization is terminated or revoked sooner.      Influenza A by PCR NEGATIVE NEGATIVE Final   Influenza B by PCR NEGATIVE NEGATIVE Final    Comment: (NOTE) The Xpert Xpress SARS-CoV-2/FLU/RSV assay is intended as an aid in  the diagnosis of influenza from Nasopharyngeal swab specimens and  should not be used as a sole basis for treatment. Nasal washings and  aspirates are unacceptable for Xpert Xpress SARS-CoV-2/FLU/RSV  testing.  Fact Sheet for Patients: PinkCheek.be  Fact Sheet for Healthcare Providers: GravelBags.it  This test is not yet approved or cleared by the [place] FDA and  has been authorized for detection and/or diagnosis of SARS-CoV-2 by  FDA under an Emergency Use Authorization (EUA). This EUA will remain  in effect (meaning this test can be used) for the duration of the  Covid-19 declaration under Section 564(b)(1) of the Act, 21  U.S.C. section 360bbb-3(b)(1), unless the authorization is  terminated or revoked. Performed at [facility], [address] [place], [place] [address]   Culture, blood (Routine x 2)     Status: None (Preliminary result)   Collection Time: 01/25/20 12:49 AM   Specimen: BLOOD LEFT HAND  Result Value Ref Range Status   Specimen Description BLOOD LEFT HAND  Final   Special Requests   Final    BOTTLES DRAWN AEROBIC AND  ANAEROBIC Blood Culture adequate volume   Culture   Final    NO GROWTH 2 DAYS Performed at [facility], [address], [place], [place] [address]    Report Status PENDING  Incomplete    Coagulation Studies: Recent Labs    01/25/20 0050  LABPROT 13.5  INR 1.1    Urinalysis: No results for input(s): COLORURINE, LABSPEC, PHURINE, GLUCOSEU, HGBUR, BILIRUBINUR, KETONESUR, PROTEINUR, UROBILINOGEN, NITRITE, LEUKOCYTESUR in the last 72 hours.  Invalid input(s): APPERANCEUR    Imaging: No results found.   Medications:   . sodium chloride    . sodium chloride    . remdesivir 100 mg in NS 100 mL 100 mg (01/27/20 0816)   . vitamin C  500 mg Oral Daily  . calcium acetate  1,334-2,001 mg Oral TID WC  . Chlorhexidine Gluconate Cloth  6 each Topical Q0600  . docusate sodium  100 mg Oral BID  . ferric citrate  420 mg Oral TID  .  gabapentin  600 mg Oral QHS  . heparin injection (subcutaneous)  5,000 Units Subcutaneous Q8H  . ipratropium  2 puff Inhalation Q6H  . mouth rinse  15 mL Mouth Rinse BID  . methylPREDNISolone (SOLU-MEDROL) injection  60 mg Intravenous Q12H  . midodrine  5 mg Oral TID WC  . zinc sulfate  220 mg Oral Daily   sodium chloride, sodium chloride, acetaminophen, albuterol, alteplase, dextromethorphan-guaiFENesin, heparin, hydrOXYzine, hydrOXYzine, lidocaine (PF), lidocaine-prilocaine, oxyCODONE-acetaminophen **AND** oxyCODONE, pentafluoroprop-tetrafluoroeth  Assessment/ Plan:  Mr. KAMAL JURGENS is a 43 y.o.  male   with past medical history of end-stage renal disease on HD TTS, hypertension on midodrine, diabetes, gout, anemia who presented with shortness of breath, fever, and chills for 2 days patient got diagnosed with COVID-19 pneumonia.  # ESRD on dialysis TTS Plan for dialysis today Will continue TTS schedule Will assess daily for additional dialysis need  #Acute hypoxic respiratory failure secondary to acute Covid-19 viral pneumonia -Patient  is maintaining SpO2 above 90 % with current supplemental O2 3L via nasal canula,denies SOB - Getting treated with Remdesivir, solumedrol  -Bronchodilators and antitussives as needed - Continue Management per primary team  # Hyperkalemia  Potassium was 5.4 on admission K+4.8 today  #Hypotension Blood pressure readings within acceptable range Patient is on Midodrine       LOS: 2 Kinley Ferrentino 10/5/202111:28 AM

## 2020-01-27 NOTE — Progress Notes (Signed)
Hemodialysis patient known at [facility] TTS 6:00am. Patient started treating on Cohort shift TTS 4:30pm this past Saturday. Patient normally drives self to treatments.Please contact me with any dialysis placement concerns.  Elvera Bicker Dialysis Coordinator [phone]

## 2020-01-28 DIAGNOSIS — U071 COVID-19: Secondary | ICD-10-CM | POA: Insufficient documentation

## 2020-01-28 LAB — COMPREHENSIVE METABOLIC PANEL
ALT: 19 U/L (ref 0–44)
AST: 20 U/L (ref 15–41)
Albumin: 3.9 g/dL (ref 3.5–5.0)
Alkaline Phosphatase: 56 U/L (ref 38–126)
Anion gap: 16 — ABNORMAL HIGH (ref 5–15)
BUN: 77 mg/dL — ABNORMAL HIGH (ref 6–20)
CO2: 28 mmol/L (ref 22–32)
Calcium: 7.9 mg/dL — ABNORMAL LOW (ref 8.9–10.3)
Chloride: 95 mmol/L — ABNORMAL LOW (ref 98–111)
Creatinine, Ser: 11.77 mg/dL — ABNORMAL HIGH (ref 0.61–1.24)
GFR calc non Af Amer: 5 mL/min — ABNORMAL LOW (ref >60)
Glucose, Bld: 213 mg/dL — ABNORMAL HIGH (ref 70–99)
Potassium: 4.7 mmol/L (ref 3.5–5.1)
Sodium: 139 mmol/L (ref 135–145)
Total Bilirubin: 0.7 mg/dL (ref 0.3–1.2)
Total Protein: 7.4 g/dL (ref 6.5–8.1)

## 2020-01-28 LAB — CBC WITH DIFFERENTIAL/PLATELET
Abs Immature Granulocytes: 0.23 10*3/uL — ABNORMAL HIGH (ref 0.00–0.07)
Basophils Absolute: 0 10*3/uL (ref 0.0–0.1)
Basophils Relative: 0 %
Eosinophils Absolute: 0 10*3/uL (ref 0.0–0.5)
Eosinophils Relative: 0 %
HCT: 32.2 % — ABNORMAL LOW (ref 39.0–52.0)
Hemoglobin: 10.4 g/dL — ABNORMAL LOW (ref 13.0–17.0)
Immature Granulocytes: 2 %
Lymphocytes Relative: 9 %
Lymphs Abs: 1 10*3/uL (ref 0.7–4.0)
MCH: 29.4 pg (ref 26.0–34.0)
MCHC: 32.3 g/dL (ref 30.0–36.0)
MCV: 91 fL (ref 80.0–100.0)
Monocytes Absolute: 0.7 10*3/uL (ref 0.1–1.0)
Monocytes Relative: 6 %
Neutro Abs: 9.5 10*3/uL — ABNORMAL HIGH (ref 1.7–7.7)
Neutrophils Relative %: 83 %
Platelets: 143 10*3/uL — ABNORMAL LOW (ref 150–400)
RBC: 3.54 MIL/uL — ABNORMAL LOW (ref 4.22–5.81)
RDW: 15.1 % (ref 11.5–15.5)
Smear Review: NORMAL
WBC: 11.5 10*3/uL — ABNORMAL HIGH (ref 4.0–10.5)
nRBC: 0 % (ref 0.0–0.2)

## 2020-01-28 LAB — FIBRIN DERIVATIVES D-DIMER (ARMC ONLY): Fibrin derivatives D-dimer (ARMC): 1323.36 ng/mL (FEU) — ABNORMAL HIGH (ref 0.00–499.00)

## 2020-01-28 LAB — FERRITIN: Ferritin: 1499 ng/mL — ABNORMAL HIGH (ref 24–336)

## 2020-01-28 LAB — C-REACTIVE PROTEIN: CRP: 3.6 mg/dL — ABNORMAL HIGH (ref <1.0)

## 2020-01-28 MED ORDER — PREDNISONE 50 MG PO TABS: 50.0000 mg | ORAL_TABLET | Freq: Every day | ORAL | 0 refills | Status: AC

## 2020-01-28 MED ORDER — ZINC SULFATE 220 (50 ZN) MG PO CAPS: 220.0000 mg | ORAL_CAPSULE | Freq: Every day | ORAL | 0 refills | Status: AC

## 2020-01-28 MED ORDER — ALBUTEROL SULFATE HFA 108 (90 BASE) MCG/ACT IN AERS: 2.0000 | INHALATION_SPRAY | Freq: Four times a day (QID) | RESPIRATORY_TRACT | 0 refills | Status: DC | PRN

## 2020-01-28 MED ORDER — ASCORBIC ACID 500 MG PO TABS: 500.0000 mg | ORAL_TABLET | Freq: Every day | ORAL | 0 refills | Status: AC

## 2020-01-28 NOTE — Progress Notes (Signed)
SATURATION QUALIFICATIONS: (This note is used to comply with regulatory documentation for home oxygen)  Patient Saturations on Room Air at Rest = 94%  Patient Saturations on Room Air while Ambulating = 90%    

## 2020-01-28 NOTE — Care Management Important Message (Signed)
Important Message  Patient Details  Name: Patrick Downs MRN: [id number] Date of Birth: Mar 25, 1976   Medicare Important Message Given:  Yes  Reviewed with patient via room phone due to isolation status.  Copy of Medicare IM to be brought to unit to be delivered by nursing staff.    Dannette Barbara 01/28/2020, 11:46 AM

## 2020-01-28 NOTE — Discharge Summary (Signed)
Physician Discharge Summary  [name] HBZ:[id number] DOB: 08-21-1975 DOA: 01/25/2020  PCP: Baxter Hire, MD  Admit date: 01/25/2020 Discharge date: 01/28/2020  Admitted From: Home Disposition: Home  Recommendations for Outpatient Follow-up:  1. Follow up with PCP in 1-2 weeks 2. Follow-up with nephrology 2 weeks  Home Health: No Equipment/Devices: None Discharge Condition: Stable CODE STATUS: Full Diet recommendation: Renal Brief/Interim Summary: Patrick Downs is a 44 year old male with past medical history notable for ESRD on HD (TTS), hypotension on midodrine, diet-controlled diabetes mellitus, gout, depression, history of Guillain-Barr syndrome, anemia who presented with shortness of breath, fevers and chills for 2 days.  Also associated generalized weakness.  Missed dialysis on Thursday.  Patient is unvaccinated against Covid-19.  Received 4 doses of remdesivir in house. Was maintained on IV steroids transition to p.o. prednisone. Oxygen status improved. Patient was weaned from submental oxygen entirely on the day of discharge. He was ambulated around the hallways with the nursing staff without desaturation. He is stable for discharge at this time. I have notified the nephrology service we have confirmed that he has a spot dedicated to Covid positive patients at his outpatient dialysis center on 01/29/20 which is his regular dialysis day. Stable for discharge at this time. Discharged home in stable condition.   Discharge Diagnoses:  Principal Problem:   Acute hypoxemic respiratory failure due to COVID-19 [facility]) Active Problems:   ESRD on dialysis ([place])   Hypotension   Hyperkalemia   Anemia in ESRD (end-stage renal disease) (HCC)   Sepsis (HCC)   Thrombocytopenia (HCC)   Elevated troponin   Peripheral neuropathy  Acute hypoxic respiratory failure secondary to acute Covid-19 viral pneumonia during the ongoing 2020/2021 Covid 19 Pandemic - POA Severe sepsis, present on  admission Patient presenting to the ED with progressive shortness of breath, fevers/chills.  Patient was noted to be hypoxic on arrival with SPO2 87%.  Patient also hypotensive with BP 82/54 that was responsive to IV fluid bolus.  Additionally, patient was tachycardic with HR 117 and a temperature of 103.3. Inflammatory markers all downtrending on day of discharge. Completed for 5 days of remdesivir. Fifth dose none indicated at this time. Transition to p.o. prednisone to complete 10-day course on discharge. Covid safety instructions provided in AVS. Discharged home in stable condition.  ESRD on HD TTS --Nephrology following, appreciate assistance --Underwent HD on 01/25/2020,  and again on 01/27/20 --Outpatient HD chair for Covid positive patients was secured prior to discharge  Hyperkalemia, resolved Potassium 5.4 on arrival, etiology likely secondary to missed dialysis session.  Patient was given Lokelma p.o.  And underwent HD on 01/25/2020 and 01/27/20  Type 2 diabetes mellitus Diet controlled at home.  Hemoglobin A1c 5.7.  Peripheral neuropathy --Gabapentin 600 mg p.o. nightly  Hx Hypotension --Midodrine 5 mg p.o. 3 times daily  Discharge Instructions  Discharge Instructions    Diet - low sodium heart healthy   Complete by: As directed    Increase activity slowly   Complete by: As directed    No wound care   Complete by: As directed      Allergies as of 01/28/2020      Reactions   Ondansetron Other (See Comments)   Stomach pain    Minoxidil Other (See Comments)   "put fluid around my heart", PERICARDIAL EFFUSION   Morphine And Related Other (See Comments)   Aggressive    Omnipaque [iohexol] Itching, Other (See Comments)   Rigors on one occasion, widespread itching on a  separate occasion (resolved with Benadryl), tremors      Medication List    TAKE these medications   acetaminophen 500 MG tablet Commonly known as: TYLENOL Take 1,000-1,500 mg by mouth 2 (two) times  daily as needed for moderate pain or headache.   albuterol 108 (90 Base) MCG/ACT inhaler Commonly known as: VENTOLIN HFA Inhale 2 puffs into the lungs every 6 (six) hours as needed for wheezing or shortness of breath.   ascorbic acid 500 MG tablet Commonly known as: VITAMIN C Take 1 tablet (500 mg total) by mouth daily. Start taking on: January 29, 2020   Auryxia 1 GM 210 MG(Fe) tablet Generic drug: ferric citrate Take 420 mg by mouth 3 (three) times daily.   calcium acetate 667 MG capsule Commonly known as: PHOSLO Take 3 capsules (2,001 mg total) by mouth 3 (three) times daily with meals. What changed: how much to take   gabapentin 300 MG capsule Commonly known as: NEURONTIN Take 600 mg by mouth at bedtime.   hydrOXYzine 25 MG tablet Commonly known as: ATARAX/VISTARIL Take 25 mg by mouth 2 (two) times daily as needed.   midodrine 5 MG tablet Commonly known as: PROAMATINE Take 5 mg by mouth See admin instructions. Take 2 tablets (10 mg) by mouth before dialysis on Tuesdays, Thursdays, & Saturday; Take 1 tablet (5 mg) by mouth in the evening on  Tuesdays, Thursdays, & Saturdays. On non-dialysis days take 1 tablet (5 mg) by mouth every 8 hours.   oxyCODONE-acetaminophen 10-325 MG tablet Commonly known as: Percocet Take 1 tablet by mouth every 6 (six) hours as needed for pain.   oxyCODONE-acetaminophen 10-325 MG tablet Commonly known as: Percocet Take 1 tablet by mouth every 6 (six) hours as needed for pain. Start taking on: February 11, 2020   predniSONE 50 MG tablet Commonly known as: DELTASONE Take 1 tablet (50 mg total) by mouth daily for 6 days.   sildenafil 20 MG tablet Commonly known as: REVATIO Take 20-100 mg by mouth as needed (for ED).   zinc sulfate 220 (50 Zn) MG capsule Take 1 capsule (220 mg total) by mouth daily. Start taking on: January 29, 2020       Allergies  Allergen Reactions   Ondansetron Other (See Comments)    Stomach pain    Minoxidil  Other (See Comments)    "put fluid around my heart", PERICARDIAL EFFUSION   Morphine And Related Other (See Comments)    Aggressive    Omnipaque [Iohexol] Itching and Other (See Comments)    Rigors on one occasion, widespread itching on a separate occasion (resolved with Benadryl), tremors    Consultations:  Nephrology   Procedures/Studies: DG Chest 2 View  Result Date: 01/25/2020 CLINICAL DATA:  Possible sepsis.  Fever EXAM: CHEST - 2 VIEW COMPARISON:  None. FINDINGS: Right chest wall catheter. Distal tip is at the cavoatrial junction. There are mild opacities in the right mid lung. No pleural effusion or pneumothorax. No pulmonary edema. IMPRESSION: Mild right mid lung opacities, likely chronic. Electronically Signed   By: Ulyses Jarred M.D.   On: 01/25/2020 01:45    (Echo, Carotid, EGD, Colonoscopy, ERCP)    Subjective: Patient seen and examined on day of discharge. No distress. Ambulated around hallways without desaturation. Stable for discharge  Discharge Exam: Vitals:   01/28/20 0819 01/28/20 1106  BP:  (!) 144/97  Pulse:  67  Resp: 18 20  Temp:  97.6 F (36.4 C)  SpO2:  95%   Vitals:  01/28/20 0717 01/28/20 0818 01/28/20 0819 01/28/20 1106  BP:  (!) 134/97  (!) 144/97  Pulse:  (!) 57  67  Resp: 17 19 18 20   Temp:  97.6 F (36.4 C)  97.6 F (36.4 C)  TempSrc:  Oral  Oral  SpO2:  98%  95%  Weight:      Height:        General: Pt is alert, awake, not in acute distress Cardiovascular: RRR, S1/S2 +, no rubs, no gallops, right chest PermCath Respiratory: CTA bilaterally, no wheezing, no rhonchi Abdominal: Soft, NT, ND, bowel sounds + Extremities: no edema, no cyanosis    The results of significant diagnostics from this hospitalization (including imaging, microbiology, ancillary and laboratory) are listed below for reference.     Microbiology: Recent Results (from the past 240 hour(s))  Culture, blood (Routine x 2)     Status: None (Preliminary result)    Collection Time: 01/25/20 12:30 AM   Specimen: BLOOD RIGHT FOREARM  Result Value Ref Range Status   Specimen Description BLOOD RIGHT FOREARM  Final   Special Requests   Final    BOTTLES DRAWN AEROBIC AND ANAEROBIC Blood Culture adequate volume   Culture   Final    NO GROWTH 3 DAYS Performed at [facility], [address], [place], [place] [address]    Report Status PENDING  Incomplete  Respiratory Panel by RT PCR (Flu A&B, Covid) - Nasopharyngeal Swab     Status: Abnormal   Collection Time: 01/25/20 12:45 AM   Specimen: Nasopharyngeal Swab  Result Value Ref Range Status   SARS Coronavirus 2 by RT PCR POSITIVE (A) NEGATIVE Final    Comment: RESULT CALLED TO, READ BACK BY AND VERIFIED WITH: ASHTON PETERS 01/25/20 AT 0227 HS (NOTE) SARS-CoV-2 target nucleic acids are DETECTED.  SARS-CoV-2 RNA is generally detectable in upper respiratory specimens  during the acute phase of infection. Positive results are indicative of the presence of the identified virus, but do not rule out bacterial infection or co-infection with other pathogens not detected by the test. Clinical correlation with patient history and other diagnostic information is necessary to determine patient infection status. The expected result is Negative.  Fact Sheet for Patients:  PinkCheek.be  Fact Sheet for Healthcare Providers: GravelBags.it  This test is not yet approved or cleared by the [place] FDA and  has been authorized for detection and/or diagnosis of SARS-CoV-2 by FDA under an Emergency Use Authorization (EUA).  This EUA will remain in effect (meaning this test can be [place] ed) for the duration of  the COVID-19 declaration under Section 564(b)(1) of the Act, 21 U.S.C. section 360bbb-3(b)(1), unless the authorization is terminated or revoked sooner.      Influenza A by PCR NEGATIVE NEGATIVE Final   Influenza B by PCR NEGATIVE  NEGATIVE Final    Comment: (NOTE) The Xpert Xpress SARS-CoV-2/FLU/RSV assay is intended as an aid in  the diagnosis of influenza from Nasopharyngeal swab specimens and  should not be used as a sole basis for treatment. Nasal washings and  aspirates are unacceptable for Xpert Xpress SARS-CoV-2/FLU/RSV  testing.  Fact Sheet for Patients: PinkCheek.be  Fact Sheet for Healthcare Providers: GravelBags.it  This test is not yet approved or cleared by the [place] FDA and  has been authorized for detection and/or diagnosis of SARS-CoV-2 by  FDA under an Emergency Use Authorization (EUA). This EUA will remain  in effect (meaning this test can be used) for the duration of the  Covid-19 declaration under Section 564(b)(1) of the Act, 21  U.S.C. section 360bbb-3(b)(1), unless the authorization is  terminated or revoked. Performed at [facility], [address] [place], [place] [address]   Culture, blood (Routine x 2)     Status: None (Preliminary result)   Collection Time: 01/25/20 12:49 AM   Specimen: BLOOD LEFT HAND  Result Value Ref Range Status   Specimen Description BLOOD LEFT HAND  Final   Special Requests   Final    BOTTLES DRAWN AEROBIC AND ANAEROBIC Blood Culture adequate volume   Culture   Final    NO GROWTH 3 DAYS Performed at [facility], [address] [place], [place] [address]    Report Status PENDING  Incomplete     Labs: BNP (last 3 results) Recent Labs    01/25/20 0944  BNP 502.7*   Basic Metabolic Panel: Recent Labs  Lab 01/25/20 0031 01/25/20 0944 01/27/20 0511 01/28/20 0415  NA 135  --  139 139  K 5.4*  --  4.8 4.7  CL 98  --  94* 95*  CO2 23  --  26 28  GLUCOSE 110*  --  203* 213*  BUN 33*  --  76* 77*  CREATININE 12.67*  --  13.75* 11.77*  CALCIUM 9.2  --  8.1* 7.9*  PHOS  --  4.0 1.9*  --    Liver Function Tests: Recent Labs  Lab 01/25/20 0031  01/27/20 0511 01/28/20 0415  AST 29 24 20   ALT 22 18 19   ALKPHOS 41 56 56  BILITOT 0.8 0.7 0.7  PROT 7.8 7.5 7.4  ALBUMIN 4.1 4.0 3.9   No results for input(s): LIPASE, AMYLASE in the last 168 hours. No results for input(s): AMMONIA in the last 168 hours. CBC: Recent Labs  Lab 01/25/20 0031 01/26/20 0531 01/27/20 0511 01/28/20 0415  WBC 3.0* 4.3 11.3* 11.5*  NEUTROABS 1.9 3.5 9.7* 9.5*  HGB 11.2* 10.0* 10.9* 10.4*  HCT 34.3* 31.0* 31.6* 32.2*  MCV 87.7 88.3 86.8 91.0  PLT 93* 102* 145* 143*   Cardiac Enzymes: No results for input(s): CKTOTAL, CKMB, CKMBINDEX, TROPONINI in the last 168 hours. BNP: Invalid input(s): POCBNP CBG: No results for input(s): GLUCAP in the last 168 hours. D-Dimer No results for input(s): DDIMER in the last 72 hours. Hgb A1c Recent Labs    01/26/20 0531  HGBA1C 5.7*   Lipid Profile Recent Labs    01/26/20 0531  CHOL 162  HDL 31*  LDLCALC 111*  TRIG 99  CHOLHDL 5.2   Thyroid function studies No results for input(s): TSH, T4TOTAL, T3FREE, THYROIDAB in the last 72 hours.  Invalid input(s): FREET3 Anemia work up [employer]    01/27/20 0511 01/28/20 0415  FERRITIN 1,771* 1,499*   Urinalysis No results found for: COLORURINE, APPEARANCEUR, LABSPEC, [place], GLUCOSEU, [place], [place], [place], [place], UROBILINOGEN, NITRITE, LEUKOCYTESUR Sepsis Labs Invalid input(s): PROCALCITONIN,  WBC,  LACTICIDVEN Microbiology Recent Results (from the past 240 hour(s))  Culture, blood (Routine x 2)     Status: None (Preliminary result)   Collection Time: 01/25/20 12:30 AM   Specimen: BLOOD RIGHT FOREARM  Result Value Ref Range Status   Specimen Description BLOOD RIGHT FOREARM  Final   Special Requests   Final    BOTTLES DRAWN AEROBIC AND ANAEROBIC Blood Culture adequate volume   Culture   Final    NO GROWTH 3 DAYS Performed at [facility], [address] [place], [place] [address]  Report Status PENDING  Incomplete   Respiratory Panel by RT PCR (Flu A&B, Covid) - Nasopharyngeal Swab     Status: Abnormal   Collection Time: 01/25/20 12:45 AM   Specimen: Nasopharyngeal Swab  Result Value Ref Range Status   SARS Coronavirus 2 by RT PCR POSITIVE (A) NEGATIVE Final    Comment: RESULT CALLED TO, READ BACK BY AND VERIFIED WITH: ASHTON PETERS 01/25/20 AT 0227 HS (NOTE) SARS-CoV-2 target nucleic acids are DETECTED.  SARS-CoV-2 RNA is generally detectable in upper respiratory specimens  during the acute phase of infection. Positive results are indicative of the presence of the identified virus, but do not rule out bacterial infection or co-infection with other pathogens not detected by the test. Clinical correlation with patient history and other diagnostic information is necessary to determine patient infection status. The expected result is Negative.  Fact Sheet for Patients:  PinkCheek.be  Fact Sheet for Healthcare Providers: GravelBags.it  This test is not yet approved or cleared by the [place] FDA and  has been authorized for detection and/or diagnosis of SARS-CoV-2 by FDA under an Emergency Use Authorization (EUA).  This EUA will remain in effect (meaning this test can be [place] ed) for the duration of  the COVID-19 declaration under Section 564(b)(1) of the Act, 21 U.S.C. section 360bbb-3(b)(1), unless the authorization is terminated or revoked sooner.      Influenza A by PCR NEGATIVE NEGATIVE Final   Influenza B by PCR NEGATIVE NEGATIVE Final    Comment: (NOTE) The Xpert Xpress SARS-CoV-2/FLU/RSV assay is intended as an aid in  the diagnosis of influenza from Nasopharyngeal swab specimens and  should not be used as a sole basis for treatment. Nasal washings and  aspirates are unacceptable for Xpert Xpress SARS-CoV-2/FLU/RSV  testing.  Fact Sheet for Patients: PinkCheek.be  Fact Sheet for Healthcare  Providers: GravelBags.it  This test is not yet approved or cleared by the [place] FDA and  has been authorized for detection and/or diagnosis of SARS-CoV-2 by  FDA under an Emergency Use Authorization (EUA). This EUA will remain  in effect (meaning this test can be used) for the duration of the  Covid-19 declaration under Section 564(b)(1) of the Act, 21  U.S.C. section 360bbb-3(b)(1), unless the authorization is  terminated or revoked. Performed at [facility], [address] [place], [place] [address]   Culture, blood (Routine x 2)     Status: None (Preliminary result)   Collection Time: 01/25/20 12:49 AM   Specimen: BLOOD LEFT HAND  Result Value Ref Range Status   Specimen Description BLOOD LEFT HAND  Final   Special Requests   Final    BOTTLES DRAWN AEROBIC AND ANAEROBIC Blood Culture adequate volume   Culture   Final    NO GROWTH 3 DAYS Performed at [facility], [address] [place], [place] [address]    Report Status PENDING  Incomplete     Time coordinating discharge: Over 30 minutes  SIGNED:   Sidney Ace, MD  [facility] 01/28/2020, 2:30 PM Pager   If 7PM-7AM, please contact night-coverage

## 2020-01-28 NOTE — TOC Transition Note (Signed)
Transition of Care [facility]) - CM/SW Discharge Note   Patient Details  Name: Patrick Downs MRN: [id number] Date of Birth: [birth year]  Transition of [facility]) CM/SW Contact:  Shelbie Hutching, RN Phone Number: 01/28/2020, 1:51 PM   Clinical Narrative:    Patient has been medically cleared for discharge home today.  Patient will return to his dialysis clinic tomorrow for his cohort clinic at 1630.  Patient is aware of the COVID schedule T,TH,Sat at 4:30 pm.  Elvera Bicker with patient pathways is aware of discharge today.    Final next level of care: Home/Self Care Barriers to Discharge: Barriers Resolved   Patient Goals and CMS Choice Patient states their goals for this hospitalization and ongoing recovery are:: wants to be COVID Free and make it to his dialysis tomorrow      Discharge Placement                       Discharge Plan and Services                  DME Agency: NA       HH Arranged: NA          Social Determinants of Health (SDOH) Interventions     Readmission Risk Interventions Readmission Risk Prevention Plan 01/26/2020  Transportation Screening Complete  PCP or Specialist Appt within 3-5 Days Complete  HRI or [place] Patient refused  Social Work Consult for [place] Planning/Counseling Complete  Palliative Care Screening Not Applicable  Medication Review [employer]) Complete  Some recent data might be hidden

## 2020-01-28 NOTE — Progress Notes (Signed)
[facility]  ROUNDING NOTE   Subjective:   Patrick Downs is 44 years old gentleman with past medical history of end-stage renal disease on HD TTS, hypertension on midodrine, diabetes, gout, anemia who presented with shortness of breath, fever, and chills for 2 days patient got diagnosed with COVID-19 pneumonia. Patient received dialysis treatment yesterday at the bedside,tolerated well. He is in no acute distress.  Objective:  Vital signs in last 24 hours:  Temp:  [97.6 F (36.4 C)-98.5 F (36.9 C)] 97.6 F (36.4 C) (10/06 1106) Pulse Rate:  [57-69] 67 (10/06 1106) Resp:  [10-20] 20 (10/06 1106) BP: (111-144)/(69-97) 144/97 (10/06 1106) SpO2:  [93 %-100 %] 95 % (10/06 1106)  Weight change:  Filed Weights   01/24/20 2359 01/25/20 0034  Weight: 115.7 kg 116 kg    Intake/Output: I/O last 3 completed shifts: In: 300 [IV Piggyback:300] Out: 3845 [XMIWO:0321]   Intake/Output this shift:  No intake/output data recorded.  Physical Exam: General:  Sleeping, easily arousable   Head: Moist oral mucosal membranes  Eyes: Anicteric  Neck: Supple, trachea at the midline  Lungs:  Lungs clear,normal,even respiratory effort  Heart: S1S2,regular  Abdomen:  Soft, nontender, non distended  Extremities:  No peripheral edema.  Neurologic: Alert,oriented x 3  Skin: No acute lesions or rashes  Access: Rt.Chest Permcath    Basic Metabolic Panel: Recent Labs  Lab 01/25/20 0031 01/25/20 0944 01/27/20 0511 01/28/20 0415  NA 135  --  139 139  K 5.4*  --  4.8 4.7  CL 98  --  94* 95*  CO2 23  --  26 28  GLUCOSE 110*  --  203* 213*  BUN 33*  --  76* 77*  CREATININE 12.67*  --  13.75* 11.77*  CALCIUM 9.2  --  8.1* 7.9*  PHOS  --  4.0 1.9*  --     Liver Function Tests: Recent Labs  Lab 01/25/20 0031 01/27/20 0511 01/28/20 0415  AST 29 24 20   ALT 22 18 19   ALKPHOS 41 56 56  BILITOT 0.8 0.7 0.7  PROT 7.8 7.5 7.4  ALBUMIN 4.1 4.0 3.9   No results for input(s):  LIPASE, AMYLASE in the last 168 hours. No results for input(s): AMMONIA in the last 168 hours.  CBC: Recent Labs  Lab 01/25/20 0031 01/26/20 0531 01/27/20 0511 01/28/20 0415  WBC 3.0* 4.3 11.3* 11.5*  NEUTROABS 1.9 3.5 9.7* 9.5*  HGB 11.2* 10.0* 10.9* 10.4*  HCT 34.3* 31.0* 31.6* 32.2*  MCV 87.7 88.3 86.8 91.0  PLT 93* 102* 145* 143*    Cardiac Enzymes: No results for input(s): CKTOTAL, CKMB, CKMBINDEX, TROPONINI in the last 168 hours.  BNP: Invalid input(s): POCBNP  CBG: No results for input(s): GLUCAP in the last 168 hours.  Microbiology: Results for orders placed or performed during the hospital encounter of 01/25/20  Culture, blood (Routine x 2)     Status: None (Preliminary result)   Collection Time: 01/25/20 12:30 AM   Specimen: BLOOD RIGHT FOREARM  Result Value Ref Range Status   Specimen Description BLOOD RIGHT FOREARM  Final   Special Requests   Final    BOTTLES DRAWN AEROBIC AND ANAEROBIC Blood Culture adequate volume   Culture   Final    NO GROWTH 3 DAYS Performed at [facility], [address] [place], [place] [address]    Report Status PENDING  Incomplete  Respiratory Panel by RT PCR (Flu A&B, Covid) - Nasopharyngeal Swab     Status:  Abnormal   Collection Time: 01/25/20 12:45 AM   Specimen: Nasopharyngeal Swab  Result Value Ref Range Status   SARS Coronavirus 2 by RT PCR POSITIVE (A) NEGATIVE Final    Comment: RESULT CALLED TO, READ BACK BY AND VERIFIED WITH: ASHTON PETERS 01/25/20 AT 0227 HS (NOTE) SARS-CoV-2 target nucleic acids are DETECTED.  SARS-CoV-2 RNA is generally detectable in upper respiratory specimens  during the acute phase of infection. Positive results are indicative of the presence of the identified virus, but do not rule out bacterial infection or co-infection with other pathogens not detected by the test. Clinical correlation with patient history and other diagnostic information is necessary to determine  patient infection status. The expected result is Negative.  Fact Sheet for Patients:  PinkCheek.be  Fact Sheet for Healthcare Providers: GravelBags.it  This test is not yet approved or cleared by the [place] FDA and  has been authorized for detection and/or diagnosis of SARS-CoV-2 by FDA under an Emergency Use Authorization (EUA).  This EUA will remain in effect (meaning this test can be [place] ed) for the duration of  the COVID-19 declaration under Section 564(b)(1) of the Act, 21 U.S.C. section 360bbb-3(b)(1), unless the authorization is terminated or revoked sooner.      Influenza A by PCR NEGATIVE NEGATIVE Final   Influenza B by PCR NEGATIVE NEGATIVE Final    Comment: (NOTE) The Xpert Xpress SARS-CoV-2/FLU/RSV assay is intended as an aid in  the diagnosis of influenza from Nasopharyngeal swab specimens and  should not be used as a sole basis for treatment. Nasal washings and  aspirates are unacceptable for Xpert Xpress SARS-CoV-2/FLU/RSV  testing.  Fact Sheet for Patients: PinkCheek.be  Fact Sheet for Healthcare Providers: GravelBags.it  This test is not yet approved or cleared by the [place] FDA and  has been authorized for detection and/or diagnosis of SARS-CoV-2 by  FDA under an Emergency Use Authorization (EUA). This EUA will remain  in effect (meaning this test can be used) for the duration of the  Covid-19 declaration under Section 564(b)(1) of the Act, 21  U.S.C. section 360bbb-3(b)(1), unless the authorization is  terminated or revoked. Performed at [facility], [address] [place], [place] [address]   Culture, blood (Routine x 2)     Status: None (Preliminary result)   Collection Time: 01/25/20 12:49 AM   Specimen: BLOOD LEFT HAND  Result Value Ref Range Status   Specimen Description BLOOD LEFT HAND  Final   Special  Requests   Final    BOTTLES DRAWN AEROBIC AND ANAEROBIC Blood Culture adequate volume   Culture   Final    NO GROWTH 3 DAYS Performed at [facility], [address] [place], [place] [address]    Report Status PENDING  Incomplete    Coagulation Studies: No results for input(s): LABPROT, INR in the last 72 hours.  Urinalysis: No results for input(s): COLORURINE, LABSPEC, PHURINE, GLUCOSEU, HGBUR, BILIRUBINUR, KETONESUR, PROTEINUR, UROBILINOGEN, NITRITE, LEUKOCYTESUR in the last 72 hours.  Invalid input(s): APPERANCEUR    Imaging: No results found.   Medications:   . sodium chloride    . sodium chloride    . remdesivir 100 mg in NS 100 mL 100 mg (01/28/20 0805)   . vitamin C  500 mg Oral Daily  . calcium acetate  1,334-2,001 mg Oral TID WC  . Chlorhexidine Gluconate Cloth  6 each Topical Q0600  . docusate sodium  100 mg Oral BID  . ferric citrate  420  mg Oral TID  . gabapentin  600 mg Oral QHS  . heparin injection (subcutaneous)  5,000 Units Subcutaneous Q8H  . ipratropium  2 puff Inhalation Q6H  . mouth rinse  15 mL Mouth Rinse BID  . methylPREDNISolone (SOLU-MEDROL) injection  60 mg Intravenous Q12H  . midodrine  5 mg Oral TID WC  . zinc sulfate  220 mg Oral Daily   sodium chloride, sodium chloride, acetaminophen, albuterol, alteplase, dextromethorphan-guaiFENesin, heparin, hydrOXYzine, hydrOXYzine, lidocaine (PF), lidocaine-prilocaine, oxyCODONE-acetaminophen **AND** oxyCODONE, pentafluoroprop-tetrafluoroeth  Assessment/ Plan:  Patrick Downs is a 44 y.o.  male   with past medical history of end-stage renal disease on HD TTS, hypertension on midodrine, diabetes, gout, anemia who presented with shortness of breath, fever, and chills for 2 days patient got diagnosed with COVID-19 pneumonia.  # ESRD on dialysis TTS Received dialysis yesterday Electrolyte and volume status acceptable, no need for dialysis today Will continue TTS schedule   #Acute  hypoxic respiratory failure secondary to acute Covid-19 viral pneumonia -No respiratory distress noted -Reports dry cough - Getting treated with Remdesivir, started on 01/26/20 -Bronchodilators and antitussives as needed -On steroids  # Hyperkalemia  Potassium was 5.4 on admission K+4.7 today  #Hypotension Blood pressure readings staying in normal range He is on Midodrine  Will continue monitoring      LOS: 3 Emad Brechtel 10/6/202111:35 AM

## 2020-01-28 NOTE — Progress Notes (Signed)
Pt being discharged home, discharge instructions reviewed with pt, states understanding, pt with no complaints, waiting on ride

## 2020-01-29 DIAGNOSIS — Z992 Dependence on renal dialysis: Secondary | ICD-10-CM | POA: Diagnosis not present

## 2020-01-29 DIAGNOSIS — N186 End stage renal disease: Secondary | ICD-10-CM | POA: Diagnosis not present

## 2020-01-29 DIAGNOSIS — N2581 Secondary hyperparathyroidism of renal origin: Secondary | ICD-10-CM | POA: Diagnosis not present

## 2020-01-30 LAB — CULTURE, BLOOD (ROUTINE X 2)
Culture: NO GROWTH
Culture: NO GROWTH
Special Requests: ADEQUATE
Special Requests: ADEQUATE

## 2020-01-31 DIAGNOSIS — N186 End stage renal disease: Secondary | ICD-10-CM | POA: Diagnosis not present

## 2020-01-31 DIAGNOSIS — N2581 Secondary hyperparathyroidism of renal origin: Secondary | ICD-10-CM | POA: Diagnosis not present

## 2020-01-31 DIAGNOSIS — Z992 Dependence on renal dialysis: Secondary | ICD-10-CM | POA: Diagnosis not present

## 2020-02-03 DIAGNOSIS — Z1152 Encounter for screening for COVID-19: Secondary | ICD-10-CM | POA: Diagnosis not present

## 2020-02-03 DIAGNOSIS — Z992 Dependence on renal dialysis: Secondary | ICD-10-CM | POA: Diagnosis not present

## 2020-02-03 DIAGNOSIS — N2581 Secondary hyperparathyroidism of renal origin: Secondary | ICD-10-CM | POA: Diagnosis not present

## 2020-02-03 DIAGNOSIS — U071 COVID-19: Secondary | ICD-10-CM | POA: Diagnosis not present

## 2020-02-03 DIAGNOSIS — N186 End stage renal disease: Secondary | ICD-10-CM | POA: Diagnosis not present

## 2020-02-05 DIAGNOSIS — N2581 Secondary hyperparathyroidism of renal origin: Secondary | ICD-10-CM | POA: Diagnosis not present

## 2020-02-05 DIAGNOSIS — Z992 Dependence on renal dialysis: Secondary | ICD-10-CM | POA: Diagnosis not present

## 2020-02-05 DIAGNOSIS — Z1152 Encounter for screening for COVID-19: Secondary | ICD-10-CM | POA: Diagnosis not present

## 2020-02-05 DIAGNOSIS — N186 End stage renal disease: Secondary | ICD-10-CM | POA: Diagnosis not present

## 2020-02-05 DIAGNOSIS — U071 COVID-19: Secondary | ICD-10-CM | POA: Diagnosis not present

## 2020-02-07 DIAGNOSIS — N186 End stage renal disease: Secondary | ICD-10-CM | POA: Diagnosis not present

## 2020-02-07 DIAGNOSIS — Z992 Dependence on renal dialysis: Secondary | ICD-10-CM | POA: Diagnosis not present

## 2020-02-07 DIAGNOSIS — N2581 Secondary hyperparathyroidism of renal origin: Secondary | ICD-10-CM | POA: Diagnosis not present

## 2020-02-10 DIAGNOSIS — L97514 Non-pressure chronic ulcer of other part of right foot with necrosis of bone: Secondary | ICD-10-CM | POA: Diagnosis not present

## 2020-02-10 DIAGNOSIS — N2581 Secondary hyperparathyroidism of renal origin: Secondary | ICD-10-CM | POA: Diagnosis not present

## 2020-02-10 DIAGNOSIS — N186 End stage renal disease: Secondary | ICD-10-CM | POA: Diagnosis not present

## 2020-02-10 DIAGNOSIS — L97513 Non-pressure chronic ulcer of other part of right foot with necrosis of muscle: Secondary | ICD-10-CM | POA: Diagnosis not present

## 2020-02-10 DIAGNOSIS — Z992 Dependence on renal dialysis: Secondary | ICD-10-CM | POA: Diagnosis not present

## 2020-02-12 DIAGNOSIS — Z992 Dependence on renal dialysis: Secondary | ICD-10-CM | POA: Diagnosis not present

## 2020-02-12 DIAGNOSIS — N186 End stage renal disease: Secondary | ICD-10-CM | POA: Diagnosis not present

## 2020-02-12 DIAGNOSIS — N2581 Secondary hyperparathyroidism of renal origin: Secondary | ICD-10-CM | POA: Diagnosis not present

## 2020-02-13 ENCOUNTER — Other Ambulatory Visit (INDEPENDENT_AMBULATORY_CARE_PROVIDER_SITE_OTHER): Payer: Self-pay | Admitting: Vascular Surgery

## 2020-02-13 ENCOUNTER — Encounter (INDEPENDENT_AMBULATORY_CARE_PROVIDER_SITE_OTHER): Payer: Self-pay | Admitting: Vascular Surgery

## 2020-02-13 ENCOUNTER — Ambulatory Visit (INDEPENDENT_AMBULATORY_CARE_PROVIDER_SITE_OTHER): Payer: Medicare HMO | Admitting: Vascular Surgery

## 2020-02-13 ENCOUNTER — Other Ambulatory Visit (INDEPENDENT_AMBULATORY_CARE_PROVIDER_SITE_OTHER): Payer: Medicare HMO

## 2020-02-13 ENCOUNTER — Other Ambulatory Visit: Payer: Self-pay

## 2020-02-13 VITALS — BP 108/74 | HR 93 | Resp 16 | Wt 267.0 lb

## 2020-02-13 DIAGNOSIS — U071 COVID-19: Secondary | ICD-10-CM | POA: Diagnosis not present

## 2020-02-13 DIAGNOSIS — I10 Essential (primary) hypertension: Secondary | ICD-10-CM

## 2020-02-13 DIAGNOSIS — Z992 Dependence on renal dialysis: Secondary | ICD-10-CM

## 2020-02-13 DIAGNOSIS — L97909 Non-pressure chronic ulcer of unspecified part of unspecified lower leg with unspecified severity: Secondary | ICD-10-CM

## 2020-02-13 DIAGNOSIS — L97512 Non-pressure chronic ulcer of other part of right foot with fat layer exposed: Secondary | ICD-10-CM | POA: Diagnosis not present

## 2020-02-13 DIAGNOSIS — N186 End stage renal disease: Secondary | ICD-10-CM | POA: Diagnosis not present

## 2020-02-13 NOTE — Assessment & Plan Note (Signed)
Likely an underlying cause of his renal failure and blood pressure control important in reducing the progression of atherosclerotic disease. On appropriate oral medications.  

## 2020-02-13 NOTE — Assessment & Plan Note (Signed)
He developed the toe ulcerations after his COVID-19 infection last month.  Otherwise has done well from this.

## 2020-02-13 NOTE — H&P (View-Only) (Signed)
Patient ID: Patrick Downs, male   DOB: 1975/07/17, 44 y.o.   MRN: [id number]  Chief Complaint  Patient presents with  . Follow-up    ref Vickki Muff decreased pulses    HPI Patrick Downs is a 44 y.o. male.  I am asked to see the patient by Dr. Vickki Muff for evaluation of decreased pulses with active toe ulcerations.  The patient developed toe ulcerations after COVID-19 infection last month.  He is going to need further debridement of his right third toe as there is bone exposed.  Ulcers have been somewhat slow to heal.  He does have longstanding dialysis dependence but is not a diabetic.  He has no feeling in his feet so this is not painful.  No fevers or chills or signs of infection. The patient has also been catheter dependent for about a year.  We discussed doing a venogram several months ago but other things were more pressing. He is now interested in scheduling a venogram to evaluate his dialysis access options.     Past Medical History:  Diagnosis Date  . Depression   . Dialysis patient ([place])   . ESRD (end stage renal disease) on dialysis ([place])   . Failure to thrive in adult   . Gout   . Guillain Barr syndrome ([place])   . HTN (hypertension)   . Kidney failure   . Pneumonia   . Renal insufficiency   . Respiratory failure [facility])     Past Surgical History:  Procedure Laterality Date  . A/V FISTULAGRAM Right 01/07/2018   Procedure: A/V FISTULAGRAM;  Surgeon: Algernon Huxley, MD;  Location: [facility];  Service: Cardiovascular;  Laterality: Right;  . AMPUTATION TOE Right 06/08/2017   Procedure: AMPUTATION TOE RIGHT FIFTH TOE;  Surgeon: Samara Deist, DPM;  Location: [facility];  Service: Podiatry;  Laterality: Right;  . AMPUTATION TOE Left 05/17/2018   Procedure: RAY LEFT 5TH;  Surgeon: Samara Deist, DPM;  Location: [facility];  Service: Podiatry;  Laterality: Left;  . AV FISTULA PLACEMENT     x5      2 graphs  . DIALYSIS/PERMA CATHETER INSERTION N/A 01/20/2019   Procedure:  DIALYSIS/PERMA CATHETER INSERTION;  Surgeon: Algernon Huxley, MD;  Location: [facility];  Service: Cardiovascular;  Laterality: N/A;  . DIALYSIS/PERMA CATHETER REMOVAL N/A 06/17/2018   Procedure: DIALYSIS/PERMA CATHETER REMOVAL;  Surgeon: Algernon Huxley, MD;  Location: [facility];  Service: Cardiovascular;  Laterality: N/A;  . PARATHYROIDECTOMY    . PERIPHERAL VASCULAR THROMBECTOMY Right 01/22/2019   Procedure: PERIPHERAL VASCULAR THROMBECTOMY;  Surgeon: Algernon Huxley, MD;  Location: [facility];  Service: Cardiovascular;  Laterality: Right;  . RENAL BIOPSY    . REVISON OF ARTERIOVENOUS FISTULA Right 02/07/2018   Procedure: REVISON OF ARTERIOVENOUS FISTULA;  Surgeon: Algernon Huxley, MD;  Location: [facility];  Service: Vascular;  Laterality: Right;  . TEE WITHOUT CARDIOVERSION N/A 01/22/2018   Procedure: TRANSESOPHAGEAL ECHOCARDIOGRAM (TEE);  Surgeon: Dionisio David, MD;  Location: [facility];  Service: Cardiovascular;  Laterality: N/A;  . tonsiilectomy    . tracheotomy       Family History  Problem Relation Age of Onset  . Diabetes Mellitus II Father   . Kidney disease Father   . Kidney failure Paternal Grandfather   . Prostate cancer Neg Hx   . Kidney cancer Neg Hx   . Bladder Cancer Neg Hx      Social History  Tobacco Use  . Smoking status: Never Smoker  . Smokeless tobacco: Never Used  Vaping Use  . Vaping Use: Never used  Substance Use Topics  . Alcohol use: No  . Drug use: Yes    Types: Marijuana    Comment: last time was June     Allergies  Allergen Reactions  . Hepatitis B Vaccine     Other reaction(s): Unknown  . Ondansetron Other (See Comments)    Stomach pain   . Minoxidil Other (See Comments)    "put fluid around my heart", PERICARDIAL EFFUSION  . Morphine And Related Other (See Comments)    Aggressive   . Omnipaque [Iohexol] Itching and Other (See Comments)    Rigors on one occasion, widespread itching on a separate occasion (resolved  with Benadryl), tremors    Current Outpatient Medications  Medication Sig Dispense Refill  . acetaminophen (TYLENOL) 500 MG tablet Take 1,000-1,500 mg by mouth 2 (two) times daily as needed for moderate pain or headache.    Marland Kitchen ascorbic acid (VITAMIN C) 500 MG tablet Take 1 tablet (500 mg total) by mouth daily. 30 tablet 0  . AURYXIA 1 GM 210 MG(Fe) tablet Take 420 mg by mouth 3 (three) times daily.    . calcium acetate (PHOSLO) 667 MG capsule Take 3 capsules (2,001 mg total) by mouth 3 (three) times daily with meals. (Patient taking differently: Take 1,334-2,001 mg by mouth 3 (three) times daily with meals. ) 1 capsule 0  . gabapentin (NEURONTIN) 300 MG capsule Take 600 mg by mouth at bedtime.     . hydrOXYzine (ATARAX/VISTARIL) 25 MG tablet Take 25 mg by mouth 2 (two) times daily as needed.    . midodrine (PROAMATINE) 5 MG tablet Take 5 mg by mouth See admin instructions. Take 2 tablets (10 mg) by mouth before dialysis on Tuesdays, Thursdays, & Saturday; Take 1 tablet (5 mg) by mouth in the evening on  Tuesdays, Thursdays, & Saturdays. On non-dialysis days take 1 tablet (5 mg) by mouth every 8 hours.    Marland Kitchen oxyCODONE-acetaminophen (PERCOCET) 10-325 MG tablet Take 1 tablet by mouth every 6 (six) hours as needed for pain. 120 tablet 0  . albuterol (VENTOLIN HFA) 108 (90 Base) MCG/ACT inhaler Inhale 2 puffs into the lungs every 6 (six) hours as needed for wheezing or shortness of breath. (Patient not taking: Reported on 02/13/2020) 8 g 0  . sildenafil (REVATIO) 20 MG tablet Take 20-100 mg by mouth as needed (for ED).  (Patient not taking: Reported on 02/13/2020)    . zinc sulfate 220 (50 Zn) MG capsule Take 1 capsule (220 mg total) by mouth daily. (Patient not taking: Reported on 02/13/2020) 30 capsule 0   No current facility-administered medications for this visit.      REVIEW OF SYSTEMS (Negative unless checked)  Constitutional: [] Weight loss  [] Fever  [] Chills Cardiac: [] Chest pain   [] Chest  pressure   [] Palpitations   [] Shortness of breath when laying flat   [] Shortness of breath at rest   [] Shortness of breath with exertion. Vascular:  [] Pain in legs with walking   [] Pain in legs at rest   [] Pain in legs when laying flat   [] Claudication   [] Pain in feet when walking  [] Pain in feet at rest  [] Pain in feet when laying flat   [] History of DVT   [] Phlebitis   [] Swelling in legs   [] Varicose veins   [x] Non-healing ulcers Pulmonary:   [] Uses home oxygen   [] Productive cough   []   Hemoptysis   [] Wheeze  [] COPD   [] Asthma Neurologic:  [] Dizziness  [] Blackouts   [] Seizures   [] History of stroke   [] History of TIA  [] Aphasia   [] Temporary blindness   [] Dysphagia   [x] Weakness or numbness in arms   [x] Weakness or numbness in legs Musculoskeletal:  [x] Arthritis   [] Joint swelling   [] Joint pain   [] Low back pain Hematologic:  [] Easy bruising  [] Easy bleeding   [] Hypercoagulable state   [x] Anemic  [] Hepatitis Gastrointestinal:  [] Blood in stool   [] Vomiting blood  [] Gastroesophageal reflux/heartburn   [] Abdominal pain Genitourinary:  [x] Chronic kidney disease   [] Difficult urination  [] Frequent urination  [] Burning with urination   [] Hematuria Skin:  [] Rashes   [x] Ulcers   [x] Wounds Psychological:  [] History of anxiety   []  History of major depression.    Physical Exam BP 108/74 (BP Location: Left Arm)   Pulse 93   Resp 16   Wt 267 lb (121.1 kg)   BMI 34.28 kg/m  Gen:  WD/WN, NAD Head: [place]/AT, No temporalis wasting.  Ear/Nose/Throat: Hearing grossly intact, nares w/o erythema or drainage, oropharynx w/o Erythema/Exudate Eyes: Conjunctiva clear, sclera non-icteric  Neck: trachea midline.  No JVD.  Pulmonary:  Good air movement, respirations not labored, no use of accessory muscles  Cardiac: RRR, no JVD Vascular: PermCath exiting from the right chest. Vessel Right Left  Radial Palpable Palpable           Musculoskeletal: M/S 5/5 throughout.  Deformities of his hands are present from  [place].  Right foot wounds are currently dressed today.  Trace lower extremity edema Neurologic: Sensation grossly intact in extremities.  Symmetrical.  Speech is fluent. Motor exam as listed above. Psychiatric: Judgment intact, Mood & affect appropriate for pt's clinical situation. Dermatologic: Right foot wounds are dressed today.    Radiology DG Chest 2 View  Result Date: 01/25/2020 CLINICAL DATA:  Possible sepsis.  Fever EXAM: CHEST - 2 VIEW COMPARISON:  None. FINDINGS: Right chest wall catheter. Distal tip is at the cavoatrial junction. There are mild opacities in the right mid lung. No pleural effusion or pneumothorax. No pulmonary edema. IMPRESSION: Mild right mid lung opacities, likely chronic. Electronically Signed   By: Ulyses Jarred M.D.   On: 01/25/2020 01:45   VAS [place] ABI WITH/WO TBI  Result Date: 02/13/2020 LOWER EXTREMITY DOPPLER STUDY Indications: Ulceration.  Performing Technologist: Blondell Reveal RT, RDMS, RVT  Examination Guidelines: A complete evaluation includes at minimum, Doppler waveform signals and systolic blood pressure reading at the level of bilateral brachial, anterior tibial, and posterior tibial arteries, when vessel segments are accessible. Bilateral testing is considered an integral part of a complete examination. Photoelectric Plethysmograph (PPG) waveforms and toe systolic pressure readings are included as required and additional duplex testing as needed. Limited examinations for reoccurring indications may be performed as noted.  ABI Findings: +---------+------------------+-----+---------+---------------------------------+ Right    Rt Pressure (mmHg)IndexWaveform Comment                           +---------+------------------+-----+---------+---------------------------------+ Brachial 117                                                               +---------+------------------+-----+---------+---------------------------------+ ATA      150  1.28 biphasic                                   +---------+------------------+-----+---------+---------------------------------+ PTA      120               1.03 triphasic                                  +---------+------------------+-----+---------+---------------------------------+ Great Toe                       Normal   pressure not taken due to wound                                            location/bandaging                +---------+------------------+-----+---------+---------------------------------+ +---------+------------------+-----+---------+-------+ Left     Lt Pressure (mmHg)IndexWaveform Comment +---------+------------------+-----+---------+-------+ Brachial 106                                     +---------+------------------+-----+---------+-------+ ATA      152               1.30 triphasic        +---------+------------------+-----+---------+-------+ PTA      152               1.30 triphasic        +---------+------------------+-----+---------+-------+ Great Toe107               0.91 Dampened         +---------+------------------+-----+---------+-------+  Summary: Right: Resting right ankle-brachial index is within normal range. No evidence of significant right lower extremity arterial disease. Normal PPG waveform at the base of the right great toe. Left: Resting left ankle-brachial index is within normal range. No evidence of significant left lower extremity arterial disease. The left toe-brachial index is normal.  *See table(s) above for measurements and observations.  Electronically signed by Leotis Pain MD on 02/13/2020 at 12:20:20 PM.   Final     Labs Recent Results (from the past 2160 hour(s))  Potassium     Status: None   Collection Time: 11/27/19  1:45 PM  Result Value Ref Range   Potassium 3.9 3.5 - 5.1 mmol/L    Comment: Performed at [facility], [address] [place], [place] [address]  Drug Screen 10  W/Conf, Serum     Status: Abnormal   Collection Time: 01/06/20 10:41 AM  Result Value Ref Range   Amphetamines, IA Negative Cutoff:50 ng/mL   Barbiturates, IA Negative Cutoff:0.1 ug/mL   Benzodiazepines, IA Negative Cutoff:20 ng/mL   Cocaine & Metabolite, IA Negative Cutoff:25 ng/mL   Phencyclidine, IA Negative Cutoff:8 ng/mL   THC(Marijuana) Metabolite, IA ++POSITIVE++ (A) Cutoff:5 ng/mL   Opiates, IA Negative Cutoff:5 ng/mL   Oxycodones, IA Negative Cutoff:5 ng/mL   Methadone, IA Negative Cutoff:25 ng/mL   Propoxyphene, IA Negative Cutoff:50 ng/mL    Comment: (NOTE) This test was developed and its performance characteristics determined by [facility].  It has not been cleared or approved by the Food and Drug Administration. Performed At: [facility] [address]  [address] [place], [place] [id number] Madelaine Etienne PhrmD [id number]   THC,MS,WB/Sp Rfx     Status: None   Collection Time: 01/06/20 10:41 AM  Result Value Ref Range   Cannabinoid Confirmation Positive    Tetrahydrocannabinol(THC) Negative ng/mL   Carboxy-THC 4.3 ng/mL   Hydroxy-THC Negative ng/mL   Cannabinol Negative ng/mL   Cannabidiol Negative ng/mL    Comment: (NOTE) Confirmation threshold: 1.0 ng/mL Performed At: [facility] [place], [place] [id number] Madelaine Etienne PhrmD [id number]   Culture, blood (Routine x 2)     Status: None   Collection Time: 01/25/20 12:30 AM   Specimen: BLOOD RIGHT FOREARM  Result Value Ref Range   Specimen Description BLOOD RIGHT FOREARM    Special Requests      BOTTLES DRAWN AEROBIC AND ANAEROBIC Blood Culture adequate volume   Culture      NO GROWTH 5 DAYS Performed at [facility], [address] [place], [place] [address]    Report Status 01/30/2020 FINAL   Comprehensive metabolic panel     Status: Abnormal   Collection Time: 01/25/20 12:31 AM  Result Value Ref Range   Sodium 135 135 - 145 mmol/L   Potassium 5.4 (H) 3.5  - 5.1 mmol/L    Comment: HEMOLYSIS AT THIS LEVEL MAY AFFECT RESULT   Chloride 98 98 - 111 mmol/L   CO2 23 22 - 32 mmol/L   Glucose, Bld 110 (H) 70 - 99 mg/dL    Comment: Glucose reference range applies only to samples taken after fasting for at least 8 hours.   BUN 33 (H) 6 - 20 mg/dL   Creatinine, Ser 12.67 (H) 0.61 - 1.24 mg/dL   Calcium 9.2 8.9 - 10.3 mg/dL   Total Protein 7.8 6.5 - 8.1 g/dL   Albumin 4.1 3.5 - 5.0 g/dL   AST 29 15 - 41 U/L   ALT 22 0 - 44 U/L   Alkaline Phosphatase 41 38 - 126 U/L   Total Bilirubin 0.8 0.3 - 1.2 mg/dL   GFR calc non Af Amer 4 (L) >60 mL/min   GFR calc Af Amer 5 (L) >60 mL/min   Anion gap 14 5 - 15    Comment: Performed at [facility], [address] [place], [place] [address]  Lactic acid, plasma     Status: None   Collection Time: 01/25/20 12:31 AM  Result Value Ref Range   Lactic Acid, Venous 1.0 0.5 - 1.9 mmol/L    Comment: Performed at [facility], [address] [place], [place] [address]  CBC with Differential     Status: Abnormal   Collection Time: 01/25/20 12:31 AM  Result Value Ref Range   WBC 3.0 (L) 4.0 - 10.5 K/uL   RBC 3.91 (L) 4.22 - 5.81 MIL/uL   Hemoglobin 11.2 (L) 13.0 - 17.0 g/dL   HCT 34.3 (L) 39 - 52 %   MCV 87.7 80.0 - 100.0 fL   MCH 28.6 26.0 - 34.0 pg   MCHC 32.7 30.0 - 36.0 g/dL   RDW 15.0 11.5 - 15.5 %   Platelets 93 (L) 150 - 400 K/uL    Comment: REPEATED TO VERIFY Immature Platelet Fraction may be clinically indicated, consider ordering this additional test [id number]    nRBC 0.0 0.0 - 0.2 %   Neutrophils Relative % 62 %   Neutro Abs 1.9 1.7 - 7.7 K/uL   Lymphocytes Relative 23 %  Lymphs Abs 0.7 0.7 - 4.0 K/uL   Monocytes Relative 15 %   Monocytes Absolute 0.4 0.1 - 1.0 K/uL   Eosinophils Relative 0 %   Eosinophils Absolute 0.0 0.0 - 0.5 K/uL   Basophils Relative 0 %   Basophils Absolute 0.0 0.0 - 0.1 K/uL   Immature Granulocytes 0 %   Abs Immature Granulocytes 0.01 0.00 -  0.07 K/uL    Comment: Performed at [facility], [place], [place] [address]  Troponin I (High Sensitivity)     Status: Abnormal   Collection Time: 01/25/20 12:31 AM  Result Value Ref Range   Troponin I (High Sensitivity) 39 (H) <18 ng/L    Comment: (NOTE) Elevated high sensitivity troponin I (hsTnI) values and significant  changes across serial measurements may suggest ACS but many other  chronic and acute conditions are known to elevate hsTnI results.  Refer to the "Links" section for chest pain algorithms and additional  guidance. Performed at [facility], [address] [place], [place] [address]   Respiratory Panel by RT PCR (Flu A&B, Covid) - Nasopharyngeal Swab     Status: Abnormal   Collection Time: 01/25/20 12:45 AM   Specimen: Nasopharyngeal Swab  Result Value Ref Range   SARS Coronavirus 2 by RT PCR POSITIVE (A) NEGATIVE    Comment: RESULT CALLED TO, READ BACK BY AND VERIFIED WITH: ASHTON PETERS 01/25/20 AT 0227 HS (NOTE) SARS-CoV-2 target nucleic acids are DETECTED.  SARS-CoV-2 RNA is generally detectable in upper respiratory specimens  during the acute phase of infection. Positive results are indicative of the presence of the identified virus, but do not rule out bacterial infection or co-infection with other pathogens not detected by the test. Clinical correlation with patient history and other diagnostic information is necessary to determine patient infection status. The expected result is Negative.  Fact Sheet for Patients:  PinkCheek.be  Fact Sheet for Healthcare Providers: GravelBags.it  This test is not yet approved or cleared by the [place] FDA and  has been authorized for detection and/or diagnosis of SARS-CoV-2 by FDA under an Emergency Use Authorization (EUA).  This EUA will remain in effect (meaning this test can be [place] ed) for the duration of  the  COVID-19 declaration under Section 564(b)(1) of the Act, 21 U.S.C. section 360bbb-3(b)(1), unless the authorization is terminated or revoked sooner.      Influenza A by PCR NEGATIVE NEGATIVE   Influenza B by PCR NEGATIVE NEGATIVE    Comment: (NOTE) The Xpert Xpress SARS-CoV-2/FLU/RSV assay is intended as an aid in  the diagnosis of influenza from Nasopharyngeal swab specimens and  should not be used as a sole basis for treatment. Nasal washings and  aspirates are unacceptable for Xpert Xpress SARS-CoV-2/FLU/RSV  testing.  Fact Sheet for Patients: PinkCheek.be  Fact Sheet for Healthcare Providers: GravelBags.it  This test is not yet approved or cleared by the [place] FDA and  has been authorized for detection and/or diagnosis of SARS-CoV-2 by  FDA under an Emergency Use Authorization (EUA). This EUA will remain  in effect (meaning this test can be used) for the duration of the  Covid-19 declaration under Section 564(b)(1) of the Act, 21  U.S.C. section 360bbb-3(b)(1), unless the authorization is  terminated or revoked. Performed at [facility], [address] [place], [place] [address]   Culture, blood (Routine x 2)     Status: None   Collection Time: 01/25/20 12:49 AM   Specimen: BLOOD LEFT  HAND  Result Value Ref Range   Specimen Description BLOOD LEFT HAND    Special Requests      BOTTLES DRAWN AEROBIC AND ANAEROBIC Blood Culture adequate volume   Culture      NO GROWTH 5 DAYS Performed at [facility], [address] [place], [place] [address]    Report Status 01/30/2020 FINAL   Protime-INR     Status: None   Collection Time: 01/25/20 12:50 AM  Result Value Ref Range   Prothrombin Time 13.5 11.4 - 15.2 seconds   INR 1.1 0.8 - 1.2    Comment: (NOTE) INR goal varies based on device and disease states. Performed at [facility], [place], [place] [address]    Troponin I (High Sensitivity)     Status: Abnormal   Collection Time: 01/25/20  6:05 AM  Result Value Ref Range   Troponin I (High Sensitivity) 37 (H) <18 ng/L    Comment: (NOTE) Elevated high sensitivity troponin I (hsTnI) values and significant  changes across serial measurements may suggest ACS but many other  chronic and acute conditions are known to elevate hsTnI results.  Refer to the "Links" section for chest pain algorithms and additional  guidance. Performed at [facility], [address] [place], [place] [address]   Phosphorus     Status: None   Collection Time: 01/25/20  9:44 AM  Result Value Ref Range   Phosphorus 4.0 2.5 - 4.6 mg/dL    Comment: Performed at [facility], [address] [place], [place] [address]  Parathyroid hormone, intact (no Ca)     Status: None   Collection Time: 01/25/20  9:44 AM  Result Value Ref Range   PTH 35 15 - 65 pg/mL    Comment: (NOTE) Performed At: [facility] [place], [place] [id number] Rush Farmer MD BJ:[id number]   Brain natriuretic peptide     Status: Abnormal   Collection Time: 01/25/20  9:44 AM  Result Value Ref Range   B Natriuretic Peptide 148.4 (H) 0.0 - 100.0 pg/mL    Comment: Performed at [facility], [address] [place], [place] [address]  C-reactive protein     Status: Abnormal   Collection Time: 01/25/20  9:44 AM  Result Value Ref Range   CRP 7.2 (H) <1.0 mg/dL    Comment: Performed at [facility], [place] [place], [place] [address]  Fibrin derivatives D-[facility] only)     Status: Abnormal   Collection Time: 01/25/20  9:44 AM  Result Value Ref Range   Fibrin derivatives D-dimer ([facility]) 986.90 (H) 0.00 - 499.00 ng/mL (FEU)    Comment: (NOTE) <> Exclusion of Venous Thromboembolism (VTE) - OUTPATIENT ONLY   (Emergency Department or [place])    0-499 ng/ml (FEU): With a low to intermediate pretest probability                      for  VTE this test result excludes the diagnosis                      of VTE.   >499 ng/ml (FEU) : VTE not excluded; additional work up for VTE is                      required.  <> Testing on Inpatients and Evaluation of Disseminated Intravascular   Coagulation (DIC) Reference Range:   0-499  ng/ml (FEU) Performed at [facility], [address] [place], [place] [address]   Ferritin     Status: Abnormal   Collection Time: 01/25/20  9:44 AM  Result Value Ref Range   Ferritin 1,368 (H) 24 - 336 ng/mL    Comment: Performed at [facility], [address] [place], [place] [address]  Fibrinogen     Status: None   Collection Time: 01/25/20  9:44 AM  Result Value Ref Range   Fibrinogen 399 210 - 475 mg/dL    Comment: Performed at [facility], [address] [place], [place] [address]  Hepatitis B surface antigen     Status: None   Collection Time: 01/25/20  9:44 AM  Result Value Ref Range   Hepatitis B Surface Ag NON REACTIVE NON REACTIVE    Comment: Performed at [facility], [address], [place], [place] [address]  Lactate dehydrogenase     Status: None   Collection Time: 01/25/20  9:44 AM  Result Value Ref Range   LDH 189 98 - 192 U/L    Comment: Performed at [facility], [address] [place], [place] [address]  Procalcitonin     Status: None   Collection Time: 01/25/20  9:44 AM  Result Value Ref Range   Procalcitonin 1.74 ng/mL    Comment:        Interpretation: PCT > 0.5 ng/mL and <= 2 ng/mL: Systemic infection (sepsis) is possible, but other conditions are known to elevate PCT as well. (NOTE)       Sepsis PCT Algorithm           Lower Respiratory Tract                                      Infection PCT Algorithm    ----------------------------     ----------------------------         PCT < 0.25 ng/mL                PCT < 0.10 ng/mL          Strongly encourage             Strongly discourage   discontinuation of  antibiotics    initiation of antibiotics    ----------------------------     -----------------------------       PCT 0.25 - 0.50 ng/mL            PCT 0.10 - 0.25 ng/mL               OR       >80% decrease in PCT            Discourage initiation of                                            antibiotics      Encourage discontinuation           of antibiotics    ----------------------------     -----------------------------         PCT >= 0.50 ng/mL              PCT 0.26 - 0.50 ng/mL                AND       <  80% decrease in PCT             Encourage initiation of                                             antibiotics       Encourage continuation           of antibiotics    ----------------------------     -----------------------------        PCT >= 0.50 ng/mL                  PCT > 0.50 ng/mL               AND         increase in PCT                  Strongly encourage                                      initiation of antibiotics    Strongly encourage escalation           of antibiotics                                     -----------------------------                                           PCT <= 0.25 ng/mL                                                 OR                                        > 80% decrease in PCT                                      Discontinue / Do not initiate                                             antibiotics  Performed at [facility], [address] [place], [place] [address]   Triglycerides     Status: Abnormal   Collection Time: 01/25/20  9:44 AM  Result Value Ref Range   Triglycerides 163 (H) <150 mg/dL    Comment: Performed at [facility], [address], [place], [place] [address]  CBC with Differential/Platelet     Status: Abnormal   Collection Time: 01/26/20  5:31 AM  Result Value Ref Range   WBC 4.3 4.0 - 10.5 K/uL   RBC 3.51 (L) 4.22 - 5.81 MIL/uL   Hemoglobin 10.0 (L) 13.0 - 17.0 g/dL  HCT 31.0 (L) 39 - 52  %   MCV 88.3 80.0 - 100.0 fL   MCH 28.5 26.0 - 34.0 pg   MCHC 32.3 30.0 - 36.0 g/dL   RDW 15.0 11.5 - 15.5 %   Platelets 102 (L) 150 - 400 K/uL    Comment: Immature Platelet Fraction may be clinically indicated, consider ordering this additional test [id number]    nRBC 0.0 0.0 - 0.2 %   Neutrophils Relative % 80 %   Neutro Abs 3.5 1.7 - 7.7 K/uL   Lymphocytes Relative 11 %   Lymphs Abs 0.5 (L) 0.7 - 4.0 K/uL   Monocytes Relative 8 %   Monocytes Absolute 0.3 0.1 - 1.0 K/uL   Eosinophils Relative 0 %   Eosinophils Absolute 0.0 0.0 - 0.5 K/uL   Basophils Relative 0 %   Basophils Absolute 0.0 0.0 - 0.1 K/uL   Immature Granulocytes 1 %   Abs Immature Granulocytes 0.02 0.00 - 0.07 K/uL    Comment: Performed at [facility], [address] [place], [place] [address]  C-reactive protein     Status: Abnormal   Collection Time: 01/26/20  5:31 AM  Result Value Ref Range   CRP 7.5 (H) <1.0 mg/dL    Comment: Performed at [place] [address], [place], [place] [address]  Fibrin derivatives D-Dimer [facility] only)     Status: Abnormal   Collection Time: 01/26/20  5:31 AM  Result Value Ref Range   Fibrin derivatives D-dimer ([facility]) 1,034.41 (H) 0.00 - 499.00 ng/mL (FEU)    Comment: (NOTE) <> Exclusion of Venous Thromboembolism (VTE) - OUTPATIENT ONLY   (Emergency Department or [place])    0-499 ng/ml (FEU): With a low to intermediate pretest probability                      for VTE this test result excludes the diagnosis                      of VTE.   >499 ng/ml (FEU) : VTE not excluded; additional work up for VTE is                      required.  <> Testing on Inpatients and Evaluation of Disseminated Intravascular   Coagulation (DIC) Reference Range:   0-499 ng/ml (FEU) Performed at [facility], [address] [place], [place] [address]   Ferritin     Status: Abnormal   Collection Time: 01/26/20  5:31 AM  Result Value Ref Range   Ferritin 1,481 (H)  24 - 336 ng/mL    Comment: Performed at [facility], [address] [place], [place] [address]  Hemoglobin A1c     Status: Abnormal   Collection Time: 01/26/20  5:31 AM  Result Value Ref Range   Hgb A1c MFr Bld 5.7 (H) 4.8 - 5.6 %    Comment: (NOTE) Pre diabetes:          5.7%-6.4%  Diabetes:              >6.4%  Glycemic control for   <7.0% adults with diabetes    Mean Plasma Glucose 116.89 mg/dL    Comment: Performed at [facility], [place] [address], [place], [place] [address]  Lipid panel     Status: Abnormal   Collection Time: 01/26/20  5:31 AM  Result Value Ref Range   Cholesterol 162 0 -  200 mg/dL   Triglycerides 99 <150 mg/dL   HDL 31 (L) >40 mg/dL   Total CHOL/HDL Ratio 5.2 RATIO   VLDL 20 0 - 40 mg/dL   LDL Cholesterol 111 (H) 0 - 99 mg/dL    Comment:        Total Cholesterol/HDL:CHD Risk Coronary Heart Disease Risk Table                     Men   Women  1/2 Average Risk   3.4   3.3  Average Risk       5.0   4.4  2 X Average Risk   9.6   7.1  3 X Average Risk  23.4   11.0        Use the calculated Patient Ratio above and the CHD Risk Table to determine the patient's CHD Risk.        ATP III CLASSIFICATION (LDL):  <100     mg/dL   Optimal  100-129  mg/dL   Near or Above                    Optimal  130-159  mg/dL   Borderline  160-189  mg/dL   High  >190     mg/dL   Very High Performed at [facility], [address] [place], [place] [address]   Hepatitis panel, acute     Status: None   Collection Time: 01/26/20  5:31 AM  Result Value Ref Range   Hepatitis B Surface Ag NON REACTIVE NON REACTIVE   HCV Ab NON REACTIVE NON REACTIVE    Comment: (NOTE) Nonreactive HCV antibody screen is consistent with no HCV infections,  unless recent infection is suspected or other evidence exists to indicate HCV infection.     Hep A IgM NON REACTIVE NON REACTIVE   Hep B C IgM NON REACTIVE NON REACTIVE    Comment: Performed at [facility], [place] [address], [place], [place] [address]  CBC with Differential/Platelet     Status: Abnormal   Collection Time: 01/27/20  5:11 AM  Result Value Ref Range   WBC 11.3 (H) 4.0 - 10.5 K/uL   RBC 3.64 (L) 4.22 - 5.81 MIL/uL   Hemoglobin 10.9 (L) 13.0 - 17.0 g/dL   HCT 31.6 (L) 39 - 52 %   MCV 86.8 80.0 - 100.0 fL   MCH 29.9 26.0 - 34.0 pg   MCHC 34.5 30.0 - 36.0 g/dL   RDW 14.8 11.5 - 15.5 %   Platelets 145 (L) 150 - 400 K/uL   nRBC 0.0 0.0 - 0.2 %   Neutrophils Relative % 87 %   Neutro Abs 9.7 (H) 1.7 - 7.7 K/uL   Lymphocytes Relative 7 %   Lymphs Abs 0.8 0.7 - 4.0 K/uL   Monocytes Relative 5 %   Monocytes Absolute 0.6 0.1 - 1.0 K/uL   Eosinophils Relative 0 %   Eosinophils Absolute 0.0 0.0 - 0.5 K/uL   Basophils Relative 0 %   Basophils Absolute 0.0 0.0 - 0.1 K/uL   Immature Granulocytes 1 %   Abs Immature Granulocytes 0.15 (H) 0.00 - 0.07 K/uL    Comment: Performed at [facility], [address] [place],  [address]  C-reactive protein     Status: Abnormal   Collection Time: 01/27/20  5:11 AM  Result Value Ref Range   CRP 5.6 (H) <1.0 mg/dL    Comment: Performed  at [facility], [place] [address] [place], [place] [address]  Fibrin derivatives D-Dimer [facility] only)     Status: Abnormal   Collection Time: 01/27/20  5:11 AM  Result Value Ref Range   Fibrin derivatives D-dimer ([facility]) 1,089.56 (H) 0.00 - 499.00 ng/mL (FEU)    Comment: (NOTE) <> Exclusion of Venous Thromboembolism (VTE) - OUTPATIENT ONLY   (Emergency Department or [place])    0-499 ng/ml (FEU): With a low to intermediate pretest probability                      for VTE this test result excludes the diagnosis                      of VTE.   >499 ng/ml (FEU) : VTE not excluded; additional work up for VTE is                      required.  <> Testing on Inpatients and Evaluation of Disseminated Intravascular   Coagulation (DIC) Reference Range:   0-499 ng/ml (FEU) Performed at  [facility], [address] [place], [place] [address]   Ferritin     Status: Abnormal   Collection Time: 01/27/20  5:11 AM  Result Value Ref Range   Ferritin 1,771 (H) 24 - 336 ng/mL    Comment: Performed at [facility], [address] [place], [place] [address]  Comprehensive metabolic panel     Status: Abnormal   Collection Time: 01/27/20  5:11 AM  Result Value Ref Range   Sodium 139 135 - 145 mmol/L   Potassium 4.8 3.5 - 5.1 mmol/L   Chloride 94 (L) 98 - 111 mmol/L   CO2 26 22 - 32 mmol/L   Glucose, Bld 203 (H) 70 - 99 mg/dL    Comment: Glucose reference range applies only to samples taken after fasting for at least 8 hours.   BUN 76 (H) 6 - 20 mg/dL   Creatinine, Ser 13.75 (H) 0.61 - 1.24 mg/dL   Calcium 8.1 (L) 8.9 - 10.3 mg/dL   Total Protein 7.5 6.5 - 8.1 g/dL   Albumin 4.0 3.5 - 5.0 g/dL   AST 24 15 - 41 U/L   ALT 18 0 - 44 U/L   Alkaline Phosphatase 56 38 - 126 U/L   Total Bilirubin 0.7 0.3 - 1.2 mg/dL   GFR calc non Af Amer 4 (L) >60 mL/min   GFR calc Af Amer 4 (L) >60 mL/min   Anion gap 19 (H) 5 - 15    Comment: Performed at [facility], [address] [place], [place] [address]  Phosphorus     Status: Abnormal   Collection Time: 01/27/20  5:11 AM  Result Value Ref Range   Phosphorus 1.9 (L) 2.5 - 4.6 mg/dL    Comment: Performed at [facility], [address] [place], [place] [address]  CBC with Differential/Platelet     Status: Abnormal   Collection Time: 01/28/20  4:15 AM  Result Value Ref Range   WBC 11.5 (H) 4.0 - 10.5 K/uL   RBC 3.54 (L) 4.22 - 5.81 MIL/uL   Hemoglobin 10.4 (L) 13.0 - 17.0 g/dL   HCT 32.2 (L) 39 - 52 %   MCV 91.0 80.0 - 100.0 fL   MCH 29.4 26.0 - 34.0 pg   MCHC 32.3 30.0 - 36.0 g/dL   RDW 15.1 11.5 - 15.5 %  Platelets 143 (L) 150 - 400 K/uL   nRBC 0.0 0.0 - 0.2 %   Neutrophils Relative % 83 %   Neutro Abs 9.5 (H) 1.7 - 7.7 K/uL   Lymphocytes Relative 9 %   Lymphs Abs 1.0 0.7 - 4.0  K/uL   Monocytes Relative 6 %   Monocytes Absolute 0.7 0.1 - 1.0 K/uL   Eosinophils Relative 0 %   Eosinophils Absolute 0.0 0.0 - 0.5 K/uL   Basophils Relative 0 %   Basophils Absolute 0.0 0.0 - 0.1 K/uL   WBC Morphology MORPHOLOGY UNREMARKABLE    RBC Morphology MORPHOLOGY UNREMARKABLE    Smear Review Normal platelet morphology    Immature Granulocytes 2 %   Abs Immature Granulocytes 0.23 (H) 0.00 - 0.07 K/uL    Comment: Performed at [facility], [address] [place], [place] [address]  C-reactive protein     Status: Abnormal   Collection Time: 01/28/20  4:15 AM  Result Value Ref Range   CRP 3.6 (H) <1.0 mg/dL    Comment: Performed at [facility], [place] [address] [place], [place] [address]  Fibrin derivatives D-Dimer [facility] only)     Status: Abnormal   Collection Time: 01/28/20  4:15 AM  Result Value Ref Range   Fibrin derivatives D-dimer ([facility]) 1,323.36 (H) 0.00 - 499.00 ng/mL (FEU)    Comment: (NOTE) <> Exclusion of Venous Thromboembolism (VTE) - OUTPATIENT ONLY   (Emergency Department or [place])    0-499 ng/ml (FEU): With a low to intermediate pretest probability                      for VTE this test result excludes the diagnosis                      of VTE.   >499 ng/ml (FEU) : VTE not excluded; additional work up for VTE is                      required.  <> Testing on Inpatients and Evaluation of Disseminated Intravascular   Coagulation (DIC) Reference Range:   0-499 ng/ml (FEU) Performed at [facility], [address] [place], [place] [address]   Ferritin     Status: Abnormal   Collection Time: 01/28/20  4:15 AM  Result Value Ref Range   Ferritin 1,499 (H) 24 - 336 ng/mL    Comment: Performed at [facility], [address] [place], [place] [address]  Comprehensive metabolic panel     Status: Abnormal   Collection Time: 01/28/20  4:15 AM  Result Value Ref Range   Sodium 139 135 - 145 mmol/L   Potassium 4.7 3.5 - 5.1  mmol/L   Chloride 95 (L) 98 - 111 mmol/L   CO2 28 22 - 32 mmol/L   Glucose, Bld 213 (H) 70 - 99 mg/dL    Comment: Glucose reference range applies only to samples taken after fasting for at least 8 hours.   BUN 77 (H) 6 - 20 mg/dL   Creatinine, Ser 11.77 (H) 0.61 - 1.24 mg/dL   Calcium 7.9 (L) 8.9 - 10.3 mg/dL   Total Protein 7.4 6.5 - 8.1 g/dL   Albumin 3.9 3.5 - 5.0 g/dL   AST 20 15 - 41 U/L   ALT 19 0 - 44 U/L   Alkaline Phosphatase 56 38 - 126 U/L   Total Bilirubin 0.7 0.3 - 1.2 mg/dL  GFR calc non Af Amer 5 (L) >60 mL/min   Anion gap 16 (H) 5 - 15    Comment: Performed at [facility], [address] [place], [place] [address]    Assessment/Plan:  Ulcer of great toe, right, with fat layer exposed ([place]) To assess his perfusion for wound healing noninvasive studies were performed today.  These demonstrated normal brisk waveforms and normal digital waveforms bilaterally consistent with adequate blood flow for wound healing.  His right ABI was 1.28 and his left ABI was 1.30.  Does not appear as if he would need an angiogram or other vascular intervention at this time.  COVID-19 He developed the toe ulcerations after his COVID-19 infection last month.  Otherwise has done well from this.  HTN (hypertension) Likely an underlying cause of his renal failure and blood pressure control important in reducing the progression of atherosclerotic disease. On appropriate oral medications.   ESRD on dialysis [facility]) The patient has a difficult dialysis access situation.  We discussed options with him today.  I think a bilateral upper extremity venogram and central venogram would be of benefit to help delineate what her best options would be for access placement.  He has also been catheter dependent for roughly a year, so concern for central venous stenosis would be present as well.  He gets dialysis on Tuesdays, Thursdays, and Saturdays.  He is agreeable to proceed.      Leotis Pain 02/13/2020, 12:29 PM   This note was created with Dragon medical transcription system.  Any errors from dictation are unintentional.

## 2020-02-13 NOTE — Assessment & Plan Note (Signed)
To assess his perfusion for wound healing noninvasive studies were performed today.  These demonstrated normal brisk waveforms and normal digital waveforms bilaterally consistent with adequate blood flow for wound healing.  His right ABI was 1.28 and his left ABI was 1.30.  Does not appear as if he would need an angiogram or other vascular intervention at this time.

## 2020-02-13 NOTE — Assessment & Plan Note (Signed)
The patient has a difficult dialysis access situation.  We discussed options with him today.  I think a bilateral upper extremity venogram and central venogram would be of benefit to help delineate what her best options would be for access placement.  He has also been catheter dependent for roughly a year, so concern for central venous stenosis would be present as well.  He gets dialysis on Tuesdays, Thursdays, and Saturdays.  He is agreeable to proceed.

## 2020-02-13 NOTE — Progress Notes (Signed)
Patient ID: Patrick Downs, male   DOB: November 08, 1975, 44 y.o.   MRN: [id number]  Chief Complaint  Patient presents with  . Follow-up    ref Vickki Muff decreased pulses    HPI Patrick Downs is a 44 y.o. male.  I am asked to see the patient by Dr. Vickki Muff for evaluation of decreased pulses with active toe ulcerations.  The patient developed toe ulcerations after COVID-19 infection last month.  He is going to need further debridement of his right third toe as there is bone exposed.  Ulcers have been somewhat slow to heal.  He does have longstanding dialysis dependence but is not a diabetic.  He has no feeling in his feet so this is not painful.  No fevers or chills or signs of infection. The patient has also been catheter dependent for about a year.  We discussed doing a venogram several months ago but other things were more pressing. He is now interested in scheduling a venogram to evaluate his dialysis access options.     Past Medical History:  Diagnosis Date  . Depression   . Dialysis patient ([place])   . ESRD (end stage renal disease) on dialysis ([place])   . Failure to thrive in adult   . Gout   . Guillain Barr syndrome ([place])   . HTN (hypertension)   . Kidney failure   . Pneumonia   . Renal insufficiency   . Respiratory failure [facility])     Past Surgical History:  Procedure Laterality Date  . A/V FISTULAGRAM Right 01/07/2018   Procedure: A/V FISTULAGRAM;  Surgeon: Algernon Huxley, MD;  Location: [place] [facility];  Service: Cardiovascular;  Laterality: Right;  . AMPUTATION TOE Right 06/08/2017   Procedure: AMPUTATION TOE RIGHT FIFTH TOE;  Surgeon: Samara Deist, DPM;  Location: [facility];  Service: Podiatry;  Laterality: Right;  . AMPUTATION TOE Left 05/17/2018   Procedure: RAY LEFT 5TH;  Surgeon: Samara Deist, DPM;  Location: [facility];  Service: Podiatry;  Laterality: Left;  . AV FISTULA PLACEMENT     x5      2 graphs  . DIALYSIS/PERMA CATHETER INSERTION N/A 01/20/2019   Procedure:  DIALYSIS/PERMA CATHETER INSERTION;  Surgeon: Algernon Huxley, MD;  Location: [place] [facility];  Service: Cardiovascular;  Laterality: N/A;  . DIALYSIS/PERMA CATHETER REMOVAL N/A 06/17/2018   Procedure: DIALYSIS/PERMA CATHETER REMOVAL;  Surgeon: Algernon Huxley, MD;  Location: [place] [facility];  Service: Cardiovascular;  Laterality: N/A;  . PARATHYROIDECTOMY    . PERIPHERAL VASCULAR THROMBECTOMY Right 01/22/2019   Procedure: PERIPHERAL VASCULAR THROMBECTOMY;  Surgeon: Algernon Huxley, MD;  Location: [place] [facility];  Service: Cardiovascular;  Laterality: Right;  . RENAL BIOPSY    . REVISON OF ARTERIOVENOUS FISTULA Right 02/07/2018   Procedure: REVISON OF ARTERIOVENOUS FISTULA;  Surgeon: Algernon Huxley, MD;  Location: [facility];  Service: Vascular;  Laterality: Right;  . TEE WITHOUT CARDIOVERSION N/A 01/22/2018   Procedure: TRANSESOPHAGEAL ECHOCARDIOGRAM (TEE);  Surgeon: Dionisio David, MD;  Location: [facility];  Service: Cardiovascular;  Laterality: N/A;  . tonsiilectomy    . tracheotomy       Family History  Problem Relation Age of Onset  . Diabetes Mellitus II Father   . Kidney disease Father   . Kidney failure Paternal Grandfather   . Prostate cancer Neg Hx   . Kidney cancer Neg Hx   . Bladder Cancer Neg Hx      Social History  Tobacco Use  . Smoking status: Never Smoker  . Smokeless tobacco: Never Used  Vaping Use  . Vaping Use: Never used  Substance Use Topics  . Alcohol use: No  . Drug use: Yes    Types: Marijuana    Comment: last time was June     Allergies  Allergen Reactions  . Hepatitis B Vaccine     Other reaction(s): Unknown  . Ondansetron Other (See Comments)    Stomach pain   . Minoxidil Other (See Comments)    "put fluid around my heart", PERICARDIAL EFFUSION  . Morphine And Related Other (See Comments)    Aggressive   . Omnipaque [Iohexol] Itching and Other (See Comments)    Rigors on one occasion, widespread itching on a separate occasion (resolved  with Benadryl), tremors    Current Outpatient Medications  Medication Sig Dispense Refill  . acetaminophen (TYLENOL) 500 MG tablet Take 1,000-1,500 mg by mouth 2 (two) times daily as needed for moderate pain or headache.    Marland Kitchen ascorbic acid (VITAMIN C) 500 MG tablet Take 1 tablet (500 mg total) by mouth daily. 30 tablet 0  . AURYXIA 1 GM 210 MG(Fe) tablet Take 420 mg by mouth 3 (three) times daily.    . calcium acetate (PHOSLO) 667 MG capsule Take 3 capsules (2,001 mg total) by mouth 3 (three) times daily with meals. (Patient taking differently: Take 1,334-2,001 mg by mouth 3 (three) times daily with meals. ) 1 capsule 0  . gabapentin (NEURONTIN) 300 MG capsule Take 600 mg by mouth at bedtime.     . hydrOXYzine (ATARAX/VISTARIL) 25 MG tablet Take 25 mg by mouth 2 (two) times daily as needed.    . midodrine (PROAMATINE) 5 MG tablet Take 5 mg by mouth See admin instructions. Take 2 tablets (10 mg) by mouth before dialysis on Tuesdays, Thursdays, & Saturday; Take 1 tablet (5 mg) by mouth in the evening on  Tuesdays, Thursdays, & Saturdays. On non-dialysis days take 1 tablet (5 mg) by mouth every 8 hours.    Marland Kitchen oxyCODONE-acetaminophen (PERCOCET) 10-325 MG tablet Take 1 tablet by mouth every 6 (six) hours as needed for pain. 120 tablet 0  . albuterol (VENTOLIN HFA) 108 (90 Base) MCG/ACT inhaler Inhale 2 puffs into the lungs every 6 (six) hours as needed for wheezing or shortness of breath. (Patient not taking: Reported on 02/13/2020) 8 g 0  . sildenafil (REVATIO) 20 MG tablet Take 20-100 mg by mouth as needed (for ED).  (Patient not taking: Reported on 02/13/2020)    . zinc sulfate 220 (50 Zn) MG capsule Take 1 capsule (220 mg total) by mouth daily. (Patient not taking: Reported on 02/13/2020) 30 capsule 0   No current facility-administered medications for this visit.      REVIEW OF SYSTEMS (Negative unless checked)  Constitutional: [] Weight loss  [] Fever  [] Chills Cardiac: [] Chest pain   [] Chest  pressure   [] Palpitations   [] Shortness of breath when laying flat   [] Shortness of breath at rest   [] Shortness of breath with exertion. Vascular:  [] Pain in legs with walking   [] Pain in legs at rest   [] Pain in legs when laying flat   [] Claudication   [] Pain in feet when walking  [] Pain in feet at rest  [] Pain in feet when laying flat   [] History of DVT   [] Phlebitis   [] Swelling in legs   [] Varicose veins   [x] Non-healing ulcers Pulmonary:   [] Uses home oxygen   [] Productive cough   []   Hemoptysis   [] Wheeze  [] COPD   [] Asthma Neurologic:  [] Dizziness  [] Blackouts   [] Seizures   [] History of stroke   [] History of TIA  [] Aphasia   [] Temporary blindness   [] Dysphagia   [x] Weakness or numbness in arms   [x] Weakness or numbness in legs Musculoskeletal:  [x] Arthritis   [] Joint swelling   [] Joint pain   [] Low back pain Hematologic:  [] Easy bruising  [] Easy bleeding   [] Hypercoagulable state   [x] Anemic  [] Hepatitis Gastrointestinal:  [] Blood in stool   [] Vomiting blood  [] Gastroesophageal reflux/heartburn   [] Abdominal pain Genitourinary:  [x] Chronic kidney disease   [] Difficult urination  [] Frequent urination  [] Burning with urination   [] Hematuria Skin:  [] Rashes   [x] Ulcers   [x] Wounds Psychological:  [] History of anxiety   []  History of major depression.    Physical Exam BP 108/74 (BP Location: Left Arm)   Pulse 93   Resp 16   Wt 267 lb (121.1 kg)   BMI 34.28 kg/m  Gen:  WD/WN, NAD Head: [place]/AT, No temporalis wasting.  Ear/Nose/Throat: Hearing grossly intact, nares w/o erythema or drainage, oropharynx w/o Erythema/Exudate Eyes: Conjunctiva clear, sclera non-icteric  Neck: trachea midline.  No JVD.  Pulmonary:  Good air movement, respirations not labored, no use of accessory muscles  Cardiac: RRR, no JVD Vascular: PermCath exiting from the right chest. Vessel Right Left  Radial Palpable Palpable           Musculoskeletal: M/S 5/5 throughout.  Deformities of his hands are present from  [place].  Right foot wounds are currently dressed today.  Trace lower extremity edema Neurologic: Sensation grossly intact in extremities.  Symmetrical.  Speech is fluent. Motor exam as listed above. Psychiatric: Judgment intact, Mood & affect appropriate for pt's clinical situation. Dermatologic: Right foot wounds are dressed today.    Radiology DG Chest 2 View  Result Date: 01/25/2020 CLINICAL DATA:  Possible sepsis.  Fever EXAM: CHEST - 2 VIEW COMPARISON:  None. FINDINGS: Right chest wall catheter. Distal tip is at the cavoatrial junction. There are mild opacities in the right mid lung. No pleural effusion or pneumothorax. No pulmonary edema. IMPRESSION: Mild right mid lung opacities, likely chronic. Electronically Signed   By: Ulyses Jarred M.D.   On: 01/25/2020 01:45   VAS [place] ABI WITH/WO TBI  Result Date: 02/13/2020 LOWER EXTREMITY DOPPLER STUDY Indications: Ulceration.  Performing Technologist: Blondell Reveal RT, RDMS, RVT  Examination Guidelines: A complete evaluation includes at minimum, Doppler waveform signals and systolic blood pressure reading at the level of bilateral brachial, anterior tibial, and posterior tibial arteries, when vessel segments are accessible. Bilateral testing is considered an integral part of a complete examination. Photoelectric Plethysmograph (PPG) waveforms and toe systolic pressure readings are included as required and additional duplex testing as needed. Limited examinations for reoccurring indications may be performed as noted.  ABI Findings: +---------+------------------+-----+---------+---------------------------------+ Right    Rt Pressure (mmHg)IndexWaveform Comment                           +---------+------------------+-----+---------+---------------------------------+ Brachial 117                                                               +---------+------------------+-----+---------+---------------------------------+ ATA      150  1.28 biphasic                                   +---------+------------------+-----+---------+---------------------------------+ PTA      120               1.03 triphasic                                  +---------+------------------+-----+---------+---------------------------------+ Great Toe                       Normal   pressure not taken due to wound                                            location/bandaging                +---------+------------------+-----+---------+---------------------------------+ +---------+------------------+-----+---------+-------+ Left     Lt Pressure (mmHg)IndexWaveform Comment +---------+------------------+-----+---------+-------+ Brachial 106                                     +---------+------------------+-----+---------+-------+ ATA      152               1.30 triphasic        +---------+------------------+-----+---------+-------+ PTA      152               1.30 triphasic        +---------+------------------+-----+---------+-------+ Great Toe107               0.91 Dampened         +---------+------------------+-----+---------+-------+  Summary: Right: Resting right ankle-brachial index is within normal range. No evidence of significant right lower extremity arterial disease. Normal PPG waveform at the base of the right great toe. Left: Resting left ankle-brachial index is within normal range. No evidence of significant left lower extremity arterial disease. The left toe-brachial index is normal.  *See table(s) above for measurements and observations.  Electronically signed by Leotis Pain MD on 02/13/2020 at 12:20:20 PM.   Final     Labs Recent Results (from the past 2160 hour(s))  Potassium     Status: None   Collection Time: 11/27/19  1:45 PM  Result Value Ref Range   Potassium 3.9 3.5 - 5.1 mmol/L    Comment: Performed at [facility], [address] [place], [place] [address]  Drug Screen 10  W/Conf, Serum     Status: Abnormal   Collection Time: 01/06/20 10:41 AM  Result Value Ref Range   Amphetamines, IA Negative Cutoff:50 ng/mL   Barbiturates, IA Negative Cutoff:0.1 ug/mL   Benzodiazepines, IA Negative Cutoff:20 ng/mL   Cocaine & Metabolite, IA Negative Cutoff:25 ng/mL   Phencyclidine, IA Negative Cutoff:8 ng/mL   THC(Marijuana) Metabolite, IA ++POSITIVE++ (A) Cutoff:5 ng/mL   Opiates, IA Negative Cutoff:5 ng/mL   Oxycodones, IA Negative Cutoff:5 ng/mL   Methadone, IA Negative Cutoff:25 ng/mL   Propoxyphene, IA Negative Cutoff:50 ng/mL    Comment: (NOTE) This test was developed and its performance characteristics determined by [facility].  It has not been cleared or approved by the Food and Drug Administration. Performed At: [employer] [address]  [address] [place], [place] [id number] Madelaine Etienne PhrmD IO:[id number]   THC,MS,WB/Sp Rfx     Status: None   Collection Time: 01/06/20 10:41 AM  Result Value Ref Range   Cannabinoid Confirmation Positive    Tetrahydrocannabinol(THC) Negative ng/mL   Carboxy-THC 4.3 ng/mL   Hydroxy-THC Negative ng/mL   Cannabinol Negative ng/mL   Cannabidiol Negative ng/mL    Comment: (NOTE) Confirmation threshold: 1.0 ng/mL Performed At: [facility] [place], [place] [id number] Madelaine Etienne PhrmD IW:[id number]   Culture, blood (Routine x 2)     Status: None   Collection Time: 01/25/20 12:30 AM   Specimen: BLOOD RIGHT FOREARM  Result Value Ref Range   Specimen Description BLOOD RIGHT FOREARM    Special Requests      BOTTLES DRAWN AEROBIC AND ANAEROBIC Blood Culture adequate volume   Culture      NO GROWTH 5 DAYS Performed at [facility], [address] [place], [place] [address]    Report Status 01/30/2020 FINAL   Comprehensive metabolic panel     Status: Abnormal   Collection Time: 01/25/20 12:31 AM  Result Value Ref Range   Sodium 135 135 - 145 mmol/L   Potassium 5.4 (H) 3.5  - 5.1 mmol/L    Comment: HEMOLYSIS AT THIS LEVEL MAY AFFECT RESULT   Chloride 98 98 - 111 mmol/L   CO2 23 22 - 32 mmol/L   Glucose, Bld 110 (H) 70 - 99 mg/dL    Comment: Glucose reference range applies only to samples taken after fasting for at least 8 hours.   BUN 33 (H) 6 - 20 mg/dL   Creatinine, Ser 12.67 (H) 0.61 - 1.24 mg/dL   Calcium 9.2 8.9 - 10.3 mg/dL   Total Protein 7.8 6.5 - 8.1 g/dL   Albumin 4.1 3.5 - 5.0 g/dL   AST 29 15 - 41 U/L   ALT 22 0 - 44 U/L   Alkaline Phosphatase 41 38 - 126 U/L   Total Bilirubin 0.8 0.3 - 1.2 mg/dL   GFR calc non Af Amer 4 (L) >60 mL/min   GFR calc Af Amer 5 (L) >60 mL/min   Anion gap 14 5 - 15    Comment: Performed at [facility], [address] [place], [place] [address]  Lactic acid, plasma     Status: None   Collection Time: 01/25/20 12:31 AM  Result Value Ref Range   Lactic Acid, Venous 1.0 0.5 - 1.9 mmol/L    Comment: Performed at [facility], [address] [place], [place] [address]  CBC with Differential     Status: Abnormal   Collection Time: 01/25/20 12:31 AM  Result Value Ref Range   WBC 3.0 (L) 4.0 - 10.5 K/uL   RBC 3.91 (L) 4.22 - 5.81 MIL/uL   Hemoglobin 11.2 (L) 13.0 - 17.0 g/dL   HCT 34.3 (L) 39 - 52 %   MCV 87.7 80.0 - 100.0 fL   MCH 28.6 26.0 - 34.0 pg   MCHC 32.7 30.0 - 36.0 g/dL   RDW 15.0 11.5 - 15.5 %   Platelets 93 (L) 150 - 400 K/uL    Comment: REPEATED TO VERIFY Immature Platelet Fraction may be clinically indicated, consider ordering this additional test [id number]    nRBC 0.0 0.0 - 0.2 %   Neutrophils Relative % 62 %   Neutro Abs 1.9 1.7 - 7.7 K/uL   Lymphocytes Relative 23 %  Lymphs Abs 0.7 0.7 - 4.0 K/uL   Monocytes Relative 15 %   Monocytes Absolute 0.4 0.1 - 1.0 K/uL   Eosinophils Relative 0 %   Eosinophils Absolute 0.0 0.0 - 0.5 K/uL   Basophils Relative 0 %   Basophils Absolute 0.0 0.0 - 0.1 K/uL   Immature Granulocytes 0 %   Abs Immature Granulocytes 0.01 0.00 -  0.07 K/uL    Comment: Performed at [facility], [place], [place] [address]  Troponin I (High Sensitivity)     Status: Abnormal   Collection Time: 01/25/20 12:31 AM  Result Value Ref Range   Troponin I (High Sensitivity) 39 (H) <18 ng/L    Comment: (NOTE) Elevated high sensitivity troponin I (hsTnI) values and significant  changes across serial measurements may suggest ACS but many other  chronic and acute conditions are known to elevate hsTnI results.  Refer to the "Links" section for chest pain algorithms and additional  guidance. Performed at [facility], [address] [place], [place] [address]   Respiratory Panel by RT PCR (Flu A&B, Covid) - Nasopharyngeal Swab     Status: Abnormal   Collection Time: 01/25/20 12:45 AM   Specimen: Nasopharyngeal Swab  Result Value Ref Range   SARS Coronavirus 2 by RT PCR POSITIVE (A) NEGATIVE    Comment: RESULT CALLED TO, READ BACK BY AND VERIFIED WITH: ASHTON PETERS 01/25/20 AT 0227 HS (NOTE) SARS-CoV-2 target nucleic acids are DETECTED.  SARS-CoV-2 RNA is generally detectable in upper respiratory specimens  during the acute phase of infection. Positive results are indicative of the presence of the identified virus, but do not rule out bacterial infection or co-infection with other pathogens not detected by the test. Clinical correlation with patient history and other diagnostic information is necessary to determine patient infection status. The expected result is Negative.  Fact Sheet for Patients:  PinkCheek.be  Fact Sheet for Healthcare Providers: GravelBags.it  This test is not yet approved or cleared by the [place] FDA and  has been authorized for detection and/or diagnosis of SARS-CoV-2 by FDA under an Emergency Use Authorization (EUA).  This EUA will remain in effect (meaning this test can be [place] ed) for the duration of  the  COVID-19 declaration under Section 564(b)(1) of the Act, 21 U.S.C. section 360bbb-3(b)(1), unless the authorization is terminated or revoked sooner.      Influenza A by PCR NEGATIVE NEGATIVE   Influenza B by PCR NEGATIVE NEGATIVE    Comment: (NOTE) The Xpert Xpress SARS-CoV-2/FLU/RSV assay is intended as an aid in  the diagnosis of influenza from Nasopharyngeal swab specimens and  should not be used as a sole basis for treatment. Nasal washings and  aspirates are unacceptable for Xpert Xpress SARS-CoV-2/FLU/RSV  testing.  Fact Sheet for Patients: PinkCheek.be  Fact Sheet for Healthcare Providers: GravelBags.it  This test is not yet approved or cleared by the [place] FDA and  has been authorized for detection and/or diagnosis of SARS-CoV-2 by  FDA under an Emergency Use Authorization (EUA). This EUA will remain  in effect (meaning this test can be used) for the duration of the  Covid-19 declaration under Section 564(b)(1) of the Act, 21  U.S.C. section 360bbb-3(b)(1), unless the authorization is  terminated or revoked. Performed at [facility], [address] [place], [place] [address]   Culture, blood (Routine x 2)     Status: None   Collection Time: 01/25/20 12:49 AM   Specimen: BLOOD LEFT  HAND  Result Value Ref Range   Specimen Description BLOOD LEFT HAND    Special Requests      BOTTLES DRAWN AEROBIC AND ANAEROBIC Blood Culture adequate volume   Culture      NO GROWTH 5 DAYS Performed at [facility], [address] [place], [place], [place] [address]    Report Status 01/30/2020 FINAL   Protime-INR     Status: None   Collection Time: 01/25/20 12:50 AM  Result Value Ref Range   Prothrombin Time 13.5 11.4 - 15.2 seconds   INR 1.1 0.8 - 1.2    Comment: (NOTE) INR goal varies based on device and disease states. Performed at [facility], [place], [place] [address]    Troponin I (High Sensitivity)     Status: Abnormal   Collection Time: 01/25/20  6:05 AM  Result Value Ref Range   Troponin I (High Sensitivity) 37 (H) <18 ng/L    Comment: (NOTE) Elevated high sensitivity troponin I (hsTnI) values and significant  changes across serial measurements may suggest ACS but many other  chronic and acute conditions are known to elevate hsTnI results.  Refer to the "Links" section for chest pain algorithms and additional  guidance. Performed at [facility], [address] [place], [place] [address]   Phosphorus     Status: None   Collection Time: 01/25/20  9:44 AM  Result Value Ref Range   Phosphorus 4.0 2.5 - 4.6 mg/dL    Comment: Performed at [facility], [address] [place], [place] [address]  Parathyroid hormone, intact (no Ca)     Status: None   Collection Time: 01/25/20  9:44 AM  Result Value Ref Range   PTH 35 15 - 65 pg/mL    Comment: (NOTE) Performed At: [facility] [place], [place] [id number] Rush Farmer MD [id number]   Brain natriuretic peptide     Status: Abnormal   Collection Time: 01/25/20  9:44 AM  Result Value Ref Range   B Natriuretic Peptide 148.4 (H) 0.0 - 100.0 pg/mL    Comment: Performed at [facility], [address] [place], [place] [address]  C-reactive protein     Status: Abnormal   Collection Time: 01/25/20  9:44 AM  Result Value Ref Range   CRP 7.2 (H) <1.0 mg/dL    Comment: Performed at [facility], [place] [address] [place], [place] [address]  Fibrin derivatives D-[facility] only)     Status: Abnormal   Collection Time: 01/25/20  9:44 AM  Result Value Ref Range   Fibrin derivatives D-dimer ([facility]) 986.90 (H) 0.00 - 499.00 ng/mL (FEU)    Comment: (NOTE) <> Exclusion of Venous Thromboembolism (VTE) - OUTPATIENT ONLY   (Emergency Department or [place])    0-499 ng/ml (FEU): With a low to intermediate pretest probability                      for  VTE this test result excludes the diagnosis                      of VTE.   >499 ng/ml (FEU) : VTE not excluded; additional work up for VTE is                      required.  <> Testing on Inpatients and Evaluation of Disseminated Intravascular   Coagulation (DIC) Reference Range:   0-499  ng/ml (FEU) Performed at [facility], [address] [place], [place] [address]   Ferritin     Status: Abnormal   Collection Time: 01/25/20  9:44 AM  Result Value Ref Range   Ferritin 1,368 (H) 24 - 336 ng/mL    Comment: Performed at [facility], [address] [place], [place] [address]  Fibrinogen     Status: None   Collection Time: 01/25/20  9:44 AM  Result Value Ref Range   Fibrinogen 399 210 - 475 mg/dL    Comment: Performed at [facility], [address] [place], [place] [address]  Hepatitis B surface antigen     Status: None   Collection Time: 01/25/20  9:44 AM  Result Value Ref Range   Hepatitis B Surface Ag NON REACTIVE NON REACTIVE    Comment: Performed at [facility], [place] [address] [place], [place] [address]  Lactate dehydrogenase     Status: None   Collection Time: 01/25/20  9:44 AM  Result Value Ref Range   LDH 189 98 - 192 U/L    Comment: Performed at [facility], [address] [place], [place] [address]  Procalcitonin     Status: None   Collection Time: 01/25/20  9:44 AM  Result Value Ref Range   Procalcitonin 1.74 ng/mL    Comment:        Interpretation: PCT > 0.5 ng/mL and <= 2 ng/mL: Systemic infection (sepsis) is possible, but other conditions are known to elevate PCT as well. (NOTE)       Sepsis PCT Algorithm           Lower Respiratory Tract                                      Infection PCT Algorithm    ----------------------------     ----------------------------         PCT < 0.25 ng/mL                PCT < 0.10 ng/mL          Strongly encourage             Strongly discourage   discontinuation of  antibiotics    initiation of antibiotics    ----------------------------     -----------------------------       PCT 0.25 - 0.50 ng/mL            PCT 0.10 - 0.25 ng/mL               OR       >80% decrease in PCT            Discourage initiation of                                            antibiotics      Encourage discontinuation           of antibiotics    ----------------------------     -----------------------------         PCT >= 0.50 ng/mL              PCT 0.26 - 0.50 ng/mL                AND       <  80% decrease in PCT             Encourage initiation of                                             antibiotics       Encourage continuation           of antibiotics    ----------------------------     -----------------------------        PCT >= 0.50 ng/mL                  PCT > 0.50 ng/mL               AND         increase in PCT                  Strongly encourage                                      initiation of antibiotics    Strongly encourage escalation           of antibiotics                                     -----------------------------                                           PCT <= 0.25 ng/mL                                                 OR                                        > 80% decrease in PCT                                      Discontinue / Do not initiate                                             antibiotics  Performed at [facility], [address] [place], [place] [address]   Triglycerides     Status: Abnormal   Collection Time: 01/25/20  9:44 AM  Result Value Ref Range   Triglycerides 163 (H) <150 mg/dL    Comment: Performed at [facility], [address] [place], [place] [address]  CBC with Differential/Platelet     Status: Abnormal   Collection Time: 01/26/20  5:31 AM  Result Value Ref Range   WBC 4.3 4.0 - 10.5 K/uL   RBC 3.51 (L) 4.22 - 5.81 MIL/uL   Hemoglobin 10.0 (L) 13.0 - 17.0 g/dL  HCT 31.0 (L) 39 - 52  %   MCV 88.3 80.0 - 100.0 fL   MCH 28.5 26.0 - 34.0 pg   MCHC 32.3 30.0 - 36.0 g/dL   RDW 15.0 11.5 - 15.5 %   Platelets 102 (L) 150 - 400 K/uL    Comment: Immature Platelet Fraction may be clinically indicated, consider ordering this additional test [id number]    nRBC 0.0 0.0 - 0.2 %   Neutrophils Relative % 80 %   Neutro Abs 3.5 1.7 - 7.7 K/uL   Lymphocytes Relative 11 %   Lymphs Abs 0.5 (L) 0.7 - 4.0 K/uL   Monocytes Relative 8 %   Monocytes Absolute 0.3 0.1 - 1.0 K/uL   Eosinophils Relative 0 %   Eosinophils Absolute 0.0 0.0 - 0.5 K/uL   Basophils Relative 0 %   Basophils Absolute 0.0 0.0 - 0.1 K/uL   Immature Granulocytes 1 %   Abs Immature Granulocytes 0.02 0.00 - 0.07 K/uL    Comment: Performed at [facility], [address] [place], [place] [address]  C-reactive protein     Status: Abnormal   Collection Time: 01/26/20  5:31 AM  Result Value Ref Range   CRP 7.5 (H) <1.0 mg/dL    Comment: Performed at [facility] [address] [place], [place] [address]  Fibrin derivatives D-Dimer [facility] only)     Status: Abnormal   Collection Time: 01/26/20  5:31 AM  Result Value Ref Range   Fibrin derivatives D-dimer ([facility]) 1,034.41 (H) 0.00 - 499.00 ng/mL (FEU)    Comment: (NOTE) <> Exclusion of Venous Thromboembolism (VTE) - OUTPATIENT ONLY   (Emergency Department or [place])    0-499 ng/ml (FEU): With a low to intermediate pretest probability                      for VTE this test result excludes the diagnosis                      of VTE.   >499 ng/ml (FEU) : VTE not excluded; additional work up for VTE is                      required.  <> Testing on Inpatients and Evaluation of Disseminated Intravascular   Coagulation (DIC) Reference Range:   0-499 ng/ml (FEU) Performed at [facility], [address] [place], [place] [address]   Ferritin     Status: Abnormal   Collection Time: 01/26/20  5:31 AM  Result Value Ref Range   Ferritin 1,481 (H)  24 - 336 ng/mL    Comment: Performed at [facility], [address] [place],  [address]  Hemoglobin A1c     Status: Abnormal   Collection Time: 01/26/20  5:31 AM  Result Value Ref Range   Hgb A1c MFr Bld 5.7 (H) 4.8 - 5.6 %    Comment: (NOTE) Pre diabetes:          5.7%-6.4%  Diabetes:              >6.4%  Glycemic control for   <7.0% adults with diabetes    Mean Plasma Glucose 116.89 mg/dL    Comment: Performed at [facility], [place] [address], [place],  [address]  Lipid panel     Status: Abnormal   Collection Time: 01/26/20  5:31 AM  Result Value Ref Range   Cholesterol 162 0 -  200 mg/dL   Triglycerides 99 <150 mg/dL   HDL 31 (L) >40 mg/dL   Total CHOL/HDL Ratio 5.2 RATIO   VLDL 20 0 - 40 mg/dL   LDL Cholesterol 111 (H) 0 - 99 mg/dL    Comment:        Total Cholesterol/HDL:CHD Risk Coronary Heart Disease Risk Table                     Men   Women  1/2 Average Risk   3.4   3.3  Average Risk       5.0   4.4  2 X Average Risk   9.6   7.1  3 X Average Risk  23.4   11.0        Use the calculated Patient Ratio above and the CHD Risk Table to determine the patient's CHD Risk.        ATP III CLASSIFICATION (LDL):  <100     mg/dL   Optimal  100-129  mg/dL   Near or Above                    Optimal  130-159  mg/dL   Borderline  160-189  mg/dL   High  >190     mg/dL   Very High Performed at [facility], [address] [place], [place] [address]   Hepatitis panel, acute     Status: None   Collection Time: 01/26/20  5:31 AM  Result Value Ref Range   Hepatitis B Surface Ag NON REACTIVE NON REACTIVE   HCV Ab NON REACTIVE NON REACTIVE    Comment: (NOTE) Nonreactive HCV antibody screen is consistent with no HCV infections,  unless recent infection is suspected or other evidence exists to indicate HCV infection.     Hep A IgM NON REACTIVE NON REACTIVE   Hep B C IgM NON REACTIVE NON REACTIVE    Comment: Performed at [facility], [place] [address] [place], [place] [address]  CBC with Differential/Platelet     Status: Abnormal   Collection Time: 01/27/20  5:11 AM  Result Value Ref Range   WBC 11.3 (H) 4.0 - 10.5 K/uL   RBC 3.64 (L) 4.22 - 5.81 MIL/uL   Hemoglobin 10.9 (L) 13.0 - 17.0 g/dL   HCT 31.6 (L) 39 - 52 %   MCV 86.8 80.0 - 100.0 fL   MCH 29.9 26.0 - 34.0 pg   MCHC 34.5 30.0 - 36.0 g/dL   RDW 14.8 11.5 - 15.5 %   Platelets 145 (L) 150 - 400 K/uL   nRBC 0.0 0.0 - 0.2 %   Neutrophils Relative % 87 %   Neutro Abs 9.7 (H) 1.7 - 7.7 K/uL   Lymphocytes Relative 7 %   Lymphs Abs 0.8 0.7 - 4.0 K/uL   Monocytes Relative 5 %   Monocytes Absolute 0.6 0.1 - 1.0 K/uL   Eosinophils Relative 0 %   Eosinophils Absolute 0.0 0.0 - 0.5 K/uL   Basophils Relative 0 %   Basophils Absolute 0.0 0.0 - 0.1 K/uL   Immature Granulocytes 1 %   Abs Immature Granulocytes 0.15 (H) 0.00 - 0.07 K/uL    Comment: Performed at [facility], [address] [place], [place] [address]  C-reactive protein     Status: Abnormal   Collection Time: 01/27/20  5:11 AM  Result Value Ref Range   CRP 5.6 (H) <1.0 mg/dL    Comment: Performed  at [facility], [place] [address], [place], [place] [address]  Fibrin derivatives D-Dimer [facility] only)     Status: Abnormal   Collection Time: 01/27/20  5:11 AM  Result Value Ref Range   Fibrin derivatives D-dimer ([facility]) 1,089.56 (H) 0.00 - 499.00 ng/mL (FEU)    Comment: (NOTE) <> Exclusion of Venous Thromboembolism (VTE) - OUTPATIENT ONLY   (Emergency Department or [place])    0-499 ng/ml (FEU): With a low to intermediate pretest probability                      for VTE this test result excludes the diagnosis                      of VTE.   >499 ng/ml (FEU) : VTE not excluded; additional work up for VTE is                      required.  <> Testing on Inpatients and Evaluation of Disseminated Intravascular   Coagulation (DIC) Reference Range:   0-499 ng/ml (FEU) Performed at  [facility], [address] [place], [place] [address]   Ferritin     Status: Abnormal   Collection Time: 01/27/20  5:11 AM  Result Value Ref Range   Ferritin 1,771 (H) 24 - 336 ng/mL    Comment: Performed at [facility], [address] [place], [place] [address]  Comprehensive metabolic panel     Status: Abnormal   Collection Time: 01/27/20  5:11 AM  Result Value Ref Range   Sodium 139 135 - 145 mmol/L   Potassium 4.8 3.5 - 5.1 mmol/L   Chloride 94 (L) 98 - 111 mmol/L   CO2 26 22 - 32 mmol/L   Glucose, Bld 203 (H) 70 - 99 mg/dL    Comment: Glucose reference range applies only to samples taken after fasting for at least 8 hours.   BUN 76 (H) 6 - 20 mg/dL   Creatinine, Ser 13.75 (H) 0.61 - 1.24 mg/dL   Calcium 8.1 (L) 8.9 - 10.3 mg/dL   Total Protein 7.5 6.5 - 8.1 g/dL   Albumin 4.0 3.5 - 5.0 g/dL   AST 24 15 - 41 U/L   ALT 18 0 - 44 U/L   Alkaline Phosphatase 56 38 - 126 U/L   Total Bilirubin 0.7 0.3 - 1.2 mg/dL   GFR calc non Af Amer 4 (L) >60 mL/min   GFR calc Af Amer 4 (L) >60 mL/min   Anion gap 19 (H) 5 - 15    Comment: Performed at [facility], [address] [place], [place] [address]  Phosphorus     Status: Abnormal   Collection Time: 01/27/20  5:11 AM  Result Value Ref Range   Phosphorus 1.9 (L) 2.5 - 4.6 mg/dL    Comment: Performed at [facility], [address] [place],  [address]  CBC with Differential/Platelet     Status: Abnormal   Collection Time: 01/28/20  4:15 AM  Result Value Ref Range   WBC 11.5 (H) 4.0 - 10.5 K/uL   RBC 3.54 (L) 4.22 - 5.81 MIL/uL   Hemoglobin 10.4 (L) 13.0 - 17.0 g/dL   HCT 32.2 (L) 39 - 52 %   MCV 91.0 80.0 - 100.0 fL   MCH 29.4 26.0 - 34.0 pg   MCHC 32.3 30.0 - 36.0 g/dL   RDW 15.1 11.5 - 15.5 %  Platelets 143 (L) 150 - 400 K/uL   nRBC 0.0 0.0 - 0.2 %   Neutrophils Relative % 83 %   Neutro Abs 9.5 (H) 1.7 - 7.7 K/uL   Lymphocytes Relative 9 %   Lymphs Abs 1.0 0.7 - 4.0  K/uL   Monocytes Relative 6 %   Monocytes Absolute 0.7 0.1 - 1.0 K/uL   Eosinophils Relative 0 %   Eosinophils Absolute 0.0 0.0 - 0.5 K/uL   Basophils Relative 0 %   Basophils Absolute 0.0 0.0 - 0.1 K/uL   WBC Morphology MORPHOLOGY UNREMARKABLE    RBC Morphology MORPHOLOGY UNREMARKABLE    Smear Review Normal platelet morphology    Immature Granulocytes 2 %   Abs Immature Granulocytes 0.23 (H) 0.00 - 0.07 K/uL    Comment: Performed at [facility], [address] [place], [place] [address]  C-reactive protein     Status: Abnormal   Collection Time: 01/28/20  4:15 AM  Result Value Ref Range   CRP 3.6 (H) <1.0 mg/dL    Comment: Performed at [facility], [place] [address] [place], [place] [address]  Fibrin derivatives D-Dimer [facility] only)     Status: Abnormal   Collection Time: 01/28/20  4:15 AM  Result Value Ref Range   Fibrin derivatives D-dimer ([facility]) 1,323.36 (H) 0.00 - 499.00 ng/mL (FEU)    Comment: (NOTE) <> Exclusion of Venous Thromboembolism (VTE) - OUTPATIENT ONLY   (Emergency Department or [place])    0-499 ng/ml (FEU): With a low to intermediate pretest probability                      for VTE this test result excludes the diagnosis                      of VTE.   >499 ng/ml (FEU) : VTE not excluded; additional work up for VTE is                      required.  <> Testing on Inpatients and Evaluation of Disseminated Intravascular   Coagulation (DIC) Reference Range:   0-499 ng/ml (FEU) Performed at [facility], [address] [place], [place] [address]   Ferritin     Status: Abnormal   Collection Time: 01/28/20  4:15 AM  Result Value Ref Range   Ferritin 1,499 (H) 24 - 336 ng/mL    Comment: Performed at [facility], [address] [place],  [address]  Comprehensive metabolic panel     Status: Abnormal   Collection Time: 01/28/20  4:15 AM  Result Value Ref Range   Sodium 139 135 - 145 mmol/L   Potassium 4.7 3.5 - 5.1  mmol/L   Chloride 95 (L) 98 - 111 mmol/L   CO2 28 22 - 32 mmol/L   Glucose, Bld 213 (H) 70 - 99 mg/dL    Comment: Glucose reference range applies only to samples taken after fasting for at least 8 hours.   BUN 77 (H) 6 - 20 mg/dL   Creatinine, Ser 11.77 (H) 0.61 - 1.24 mg/dL   Calcium 7.9 (L) 8.9 - 10.3 mg/dL   Total Protein 7.4 6.5 - 8.1 g/dL   Albumin 3.9 3.5 - 5.0 g/dL   AST 20 15 - 41 U/L   ALT 19 0 - 44 U/L   Alkaline Phosphatase 56 38 - 126 U/L   Total Bilirubin 0.7 0.3 - 1.2 mg/dL  GFR calc non Af Amer 5 (L) >60 mL/min   Anion gap 16 (H) 5 - 15    Comment: Performed at [facility], [address] [place], [place] [address]    Assessment/Plan:  Ulcer of great toe, right, with fat layer exposed ([place]) To assess his perfusion for wound healing noninvasive studies were performed today.  These demonstrated normal brisk waveforms and normal digital waveforms bilaterally consistent with adequate blood flow for wound healing.  His right ABI was 1.28 and his left ABI was 1.30.  Does not appear as if he would need an angiogram or other vascular intervention at this time.  COVID-19 He developed the toe ulcerations after his COVID-19 infection last month.  Otherwise has done well from this.  HTN (hypertension) Likely an underlying cause of his renal failure and blood pressure control important in reducing the progression of atherosclerotic disease. On appropriate oral medications.   ESRD on dialysis [facility]) The patient has a difficult dialysis access situation.  We discussed options with him today.  I think a bilateral upper extremity venogram and central venogram would be of benefit to help delineate what her best options would be for access placement.  He has also been catheter dependent for roughly a year, so concern for central venous stenosis would be present as well.  He gets dialysis on Tuesdays, Thursdays, and Saturdays.  He is agreeable to proceed.      Leotis Pain 02/13/2020, 12:29 PM   This note was created with Dragon medical transcription system.  Any errors from dictation are unintentional.

## 2020-02-14 DIAGNOSIS — N186 End stage renal disease: Secondary | ICD-10-CM | POA: Diagnosis not present

## 2020-02-14 DIAGNOSIS — N2581 Secondary hyperparathyroidism of renal origin: Secondary | ICD-10-CM | POA: Diagnosis not present

## 2020-02-14 DIAGNOSIS — Z992 Dependence on renal dialysis: Secondary | ICD-10-CM | POA: Diagnosis not present

## 2020-02-16 ENCOUNTER — Telehealth (INDEPENDENT_AMBULATORY_CARE_PROVIDER_SITE_OTHER): Payer: Self-pay

## 2020-02-16 ENCOUNTER — Encounter (INDEPENDENT_AMBULATORY_CARE_PROVIDER_SITE_OTHER): Payer: Self-pay

## 2020-02-16 NOTE — Telephone Encounter (Signed)
Per Caryl Pina at Pre admit patient was informed that he will not need a covid test this Friday since he tested positive on 01/25/20 he will not be retested until 04/25/20

## 2020-02-17 DIAGNOSIS — N186 End stage renal disease: Secondary | ICD-10-CM | POA: Diagnosis not present

## 2020-02-17 DIAGNOSIS — Z992 Dependence on renal dialysis: Secondary | ICD-10-CM | POA: Diagnosis not present

## 2020-02-17 DIAGNOSIS — N2581 Secondary hyperparathyroidism of renal origin: Secondary | ICD-10-CM | POA: Diagnosis not present

## 2020-02-19 DIAGNOSIS — N186 End stage renal disease: Secondary | ICD-10-CM | POA: Diagnosis not present

## 2020-02-19 DIAGNOSIS — Z992 Dependence on renal dialysis: Secondary | ICD-10-CM | POA: Diagnosis not present

## 2020-02-19 DIAGNOSIS — N2581 Secondary hyperparathyroidism of renal origin: Secondary | ICD-10-CM | POA: Diagnosis not present

## 2020-02-21 DIAGNOSIS — Z992 Dependence on renal dialysis: Secondary | ICD-10-CM | POA: Diagnosis not present

## 2020-02-21 DIAGNOSIS — N186 End stage renal disease: Secondary | ICD-10-CM | POA: Diagnosis not present

## 2020-02-21 DIAGNOSIS — N2581 Secondary hyperparathyroidism of renal origin: Secondary | ICD-10-CM | POA: Diagnosis not present

## 2020-02-22 DIAGNOSIS — N051 Unspecified nephritic syndrome with focal and segmental glomerular lesions: Secondary | ICD-10-CM | POA: Diagnosis not present

## 2020-02-22 DIAGNOSIS — N186 End stage renal disease: Secondary | ICD-10-CM | POA: Diagnosis not present

## 2020-02-22 DIAGNOSIS — Z992 Dependence on renal dialysis: Secondary | ICD-10-CM | POA: Diagnosis not present

## 2020-02-23 ENCOUNTER — Ambulatory Visit
Admission: RE | Admit: 2020-02-23 | Discharge: 2020-02-23 | Disposition: A | Discharge status: Home / Self Care | Payer: Medicare HMO | Attending: Vascular Surgery | Admitting: Vascular Surgery

## 2020-02-23 ENCOUNTER — Other Ambulatory Visit: Payer: Self-pay

## 2020-02-23 ENCOUNTER — Encounter
Admission: RE | Disposition: A | Discharge status: Home / Self Care | Payer: Self-pay | Source: Home / Self Care | Attending: Vascular Surgery

## 2020-02-23 ENCOUNTER — Encounter: Payer: Self-pay | Admitting: Vascular Surgery

## 2020-02-23 ENCOUNTER — Other Ambulatory Visit (INDEPENDENT_AMBULATORY_CARE_PROVIDER_SITE_OTHER): Payer: Self-pay | Admitting: Nurse Practitioner

## 2020-02-23 DIAGNOSIS — T82898A Other specified complication of vascular prosthetic devices, implants and grafts, initial encounter: Secondary | ICD-10-CM | POA: Diagnosis not present

## 2020-02-23 DIAGNOSIS — Z885 Allergy status to narcotic agent status: Secondary | ICD-10-CM | POA: Diagnosis not present

## 2020-02-23 DIAGNOSIS — I120 Hypertensive chronic kidney disease with stage 5 chronic kidney disease or end stage renal disease: Secondary | ICD-10-CM | POA: Diagnosis not present

## 2020-02-23 DIAGNOSIS — Z888 Allergy status to other drugs, medicaments and biological substances status: Secondary | ICD-10-CM | POA: Diagnosis not present

## 2020-02-23 DIAGNOSIS — Z8616 Personal history of COVID-19: Secondary | ICD-10-CM | POA: Diagnosis not present

## 2020-02-23 DIAGNOSIS — L97512 Non-pressure chronic ulcer of other part of right foot with fat layer exposed: Secondary | ICD-10-CM | POA: Diagnosis not present

## 2020-02-23 DIAGNOSIS — Z992 Dependence on renal dialysis: Secondary | ICD-10-CM | POA: Insufficient documentation

## 2020-02-23 DIAGNOSIS — N186 End stage renal disease: Secondary | ICD-10-CM | POA: Insufficient documentation

## 2020-02-23 HISTORY — PX: UPPER EXTREMITY VENOGRAPHY: CATH118272

## 2020-02-23 SURGERY — UPPER EXTREMITY VENOGRAPHY
Anesthesia: Moderate Sedation | Laterality: Bilateral

## 2020-02-23 MED ORDER — DIPHENHYDRAMINE HCL 50 MG/ML IJ SOLN
INTRAMUSCULAR | Status: DC | PRN
  Administered 2020-02-23: 25 mg via INTRAVENOUS

## 2020-02-23 MED ORDER — FENTANYL CITRATE (PF) 100 MCG/2ML IJ SOLN
INTRAMUSCULAR | Status: DC | PRN
  Administered 2020-02-23 (×2): 50 ug via INTRAVENOUS

## 2020-02-23 MED ORDER — FENTANYL CITRATE (PF) 100 MCG/2ML IJ SOLN: 12.5000 ug | Freq: Once | INTRAMUSCULAR | Status: DC | PRN

## 2020-02-23 MED ORDER — FAMOTIDINE 20 MG PO TABS
40.0000 mg | ORAL_TABLET | Freq: Once | ORAL | Status: AC | PRN
  Administered 2020-02-23: 40 mg via ORAL

## 2020-02-23 MED ORDER — FENTANYL CITRATE (PF) 100 MCG/2ML IJ SOLN
INTRAMUSCULAR | Status: AC
  Filled 2020-02-23: qty 2

## 2020-02-23 MED ORDER — DIPHENHYDRAMINE HCL 50 MG/ML IJ SOLN
INTRAMUSCULAR | Status: AC
  Filled 2020-02-23: qty 1

## 2020-02-23 MED ORDER — CEFAZOLIN SODIUM-DEXTROSE 1-4 GM/50ML-% IV SOLN
INTRAVENOUS | Status: AC
  Administered 2020-02-23: 1 g via INTRAVENOUS
  Filled 2020-02-23: qty 50

## 2020-02-23 MED ORDER — MIDAZOLAM HCL 2 MG/ML PO SYRP: 8.0000 mg | ORAL_SOLUTION | Freq: Once | ORAL | Status: DC | PRN

## 2020-02-23 MED ORDER — CEFAZOLIN SODIUM-DEXTROSE 1-4 GM/50ML-% IV SOLN: 1.0000 g | Freq: Once | INTRAVENOUS | Status: AC

## 2020-02-23 MED ORDER — MIDAZOLAM HCL 2 MG/2ML IJ SOLN
INTRAMUSCULAR | Status: DC | PRN
  Administered 2020-02-23 (×2): 2 mg via INTRAVENOUS

## 2020-02-23 MED ORDER — HEPARIN SODIUM (PORCINE) 1000 UNIT/ML IJ SOLN
INTRAMUSCULAR | Status: AC
  Filled 2020-02-23: qty 1

## 2020-02-23 MED ORDER — IODIXANOL 320 MG/ML IV SOLN
INTRAVENOUS | Status: DC | PRN
  Administered 2020-02-23: 25 mL

## 2020-02-23 MED ORDER — DIPHENHYDRAMINE HCL 50 MG/ML IJ SOLN
50.0000 mg | Freq: Once | INTRAMUSCULAR | Status: AC | PRN
  Administered 2020-02-23: 50 mg via INTRAVENOUS

## 2020-02-23 MED ORDER — SODIUM CHLORIDE 0.9 % IV SOLN: INTRAVENOUS | Status: DC

## 2020-02-23 MED ORDER — METHYLPREDNISOLONE SODIUM SUCC 125 MG IJ SOLR
125.0000 mg | Freq: Once | INTRAMUSCULAR | Status: AC | PRN
  Administered 2020-02-23: 125 mg via INTRAVENOUS

## 2020-02-23 MED ORDER — MIDAZOLAM HCL 5 MG/5ML IJ SOLN
INTRAMUSCULAR | Status: AC
  Filled 2020-02-23: qty 5

## 2020-02-23 MED ORDER — PROMETHAZINE HCL 25 MG/ML IJ SOLN: 12.5000 mg | Freq: Four times a day (QID) | INTRAMUSCULAR | Status: DC | PRN

## 2020-02-23 MED ORDER — CEFAZOLIN SODIUM-DEXTROSE 2-4 GM/100ML-% IV SOLN: 2.0000 g | Freq: Once | INTRAVENOUS | Status: DC

## 2020-02-23 MED ORDER — FAMOTIDINE 20 MG PO TABS
ORAL_TABLET | ORAL | Status: AC
  Filled 2020-02-23: qty 1

## 2020-02-23 MED ORDER — METHYLPREDNISOLONE SODIUM SUCC 125 MG IJ SOLR
INTRAMUSCULAR | Status: AC
  Filled 2020-02-23: qty 2

## 2020-02-23 SURGICAL SUPPLY — 6 items
CANNULA 5F STIFF (CANNULA) ×6 IMPLANT
GLIDECATH 4FR STR (CATHETERS) ×3 IMPLANT
PACK ANGIOGRAPHY (CUSTOM PROCEDURE TRAY) ×3 IMPLANT
SHEATH BRITE TIP 6FRX5.5 (SHEATH) ×3 IMPLANT
SUT MNCRL AB 4-0 PS2 18 (SUTURE) ×3 IMPLANT
WIRE MAGIC TOR.035 180C (WIRE) ×3 IMPLANT

## 2020-02-23 NOTE — Discharge Instructions (Signed)
se Moderate Conscious Sedation, Adult Sedation is the use of medicines to promote relaxation and relieve discomfort and anxiety. Moderate conscious sedation is a type of sedation. Under moderate conscious sedation, you are less alert than normal, but you are still able to respond to instructions, touch, or both. Moderate conscious sedation is used during short medical and dental procedures. It is milder than deep sedation, which is a type of sedation under which you cannot be easily woken up. It is also milder than general anesthesia, which is the use of medicines to make you unconscious. Moderate conscious sedation allows you to return to your regular activities sooner. Tell a health care provider about:  Any allergies you have.  All medicines you are taking, including vitamins, herbs, eye drops, creams, and over-the-counter medicines.  Use of steroids (by mouth or creams).  Any problems you or family members have had with sedatives and anesthetic medicines.  Any blood disorders you have.  Any surgeries you have had.  Any medical conditions you have, such as sleep apnea.  Whether you are pregnant or may be pregnant.  Any use of cigarettes, alcohol, marijuana, or street drugs. What are the risks? Generally, this is a safe procedure. However, problems may occur, including:  Getting too much medicine (oversedation).  Nausea.  Allergic reaction to medicines.  Trouble breathing. If this happens, a breathing tube may be used to help with breathing. It will be removed when you are awake and breathing on your own.  Heart trouble.  Lung trouble. What happens before the procedure? Staying hydrated Follow instructions from your health care provider about hydration, which may include:  Up to 2 hours before the procedure - you may continue to drink clear liquids, such as water, clear fruit juice, black coffee, and plain tea. Eating and drinking restrictions Follow instructions from  your health care provider about eating and drinking, which may include:  8 hours before the procedure - stop eating heavy meals or foods such as meat, fried foods, or fatty foods.  6 hours before the procedure - stop eating light meals or foods, such as toast or cereal.  6 hours before the procedure - stop drinking milk or drinks that contain milk.  2 hours before the procedure - stop drinking clear liquids. Medicine Ask your health care provider about:  Changing or stopping your regular medicines. This is especially important if you are taking diabetes medicines or blood thinners.  Taking medicines such as aspirin and ibuprofen. These medicines can thin your blood. Do not take these medicines before your procedure if your health care provider instructs you not to.  Tests and exams  You will have a physical exam.  You may have blood tests done to show: ? How well your kidneys and liver are working. ? How well your blood can clot. General instructions  Plan to have someone take you home from the hospital or clinic.  If you will be going home right after the procedure, plan to have someone with you for 24 hours. What happens during the procedure?  An IV tube will be inserted into one of your veins.  Medicine to help you relax (sedative) will be given through the IV tube.  The medical or dental procedure will be performed. What happens after the procedure?  Your blood pressure, heart rate, breathing rate, and blood oxygen level will be monitored often until the medicines you were given have worn off.  Do not drive for 24 hours. This information is  not intended to replace advice given to you by your health care provider. Make sure you discuss any questions you have with your health care provider. Document Revised: 03/23/2017 Document Reviewed: 07/31/2015 Elsevier Patient Education  [place] A venogram, or venography, is a procedure that uses an X-ray and  dye (contrast) to examine how well the veins work and how blood flows through them. Contrast helps the veins show up on X-rays. A venogram may be done:  To evaluate abnormalities in the vein.  To identify clots within veins, such as deep vein thrombosis (DVT).  To map out the veins that might be needed for another procedure. Tell a health care provider about:  Any allergies you have, especially to medicines, shellfish, iodine, and contrast.  All medicines you are taking, including vitamins, herbs, eye drops, creams, and over-the-counter medicines.  Any problems you or family members have had with anesthetic medicines.  Any blood disorders you have.  Any surgeries you have had and any complications that occurred.  Any medical conditions you have.  Whether you are pregnant, may be pregnant, or are breastfeeding.  Any history of smoking or tobacco use. What are the risks? Generally, this is a safe procedure. However, problems may occur, including:  Infection.  Bleeding.  Blood clots.  Allergic reaction to medicines or contrast.  Damage to other structures or organs.  Kidney problems.  Increased risk of cancer. Being exposed to too much radiation over a lifetime can increase the risk of cancer. The risk is small. What happens before the procedure? Medicines Ask your health care provider about:  Changing or stopping your regular medicines. This is especially important if you are taking diabetes medicines or blood thinners.  Taking medicines such as aspirin and ibuprofen. These medicines can thin your blood. Do not take these medicines unless your health care provider tells you to take them.  Taking over-the-counter medicines, vitamins, herbs, and supplements. General instructions  Follow instructions from your health care provider about eating or drinking restrictions.  You may have blood tests to check how well your kidneys and liver are working and how well your  blood can clot.  Plan to have someone take you home from the hospital or clinic. What happens during the procedure?   An IV will be inserted into one of your veins.  You may be given a medicine to help you relax (sedative).  You will lie down on an X-ray table. The table may be tilted in different directions during the procedure to help the contrast move throughout your body. Safety straps will keep you secure if the table is tilted.  If veins in your arm or leg will be examined, a band may be wrapped around that arm or leg to keep the veins full of blood. This may cause your arm or leg to feel numb.  The contrast will be injected into your IV. You may have a hot, flushed feeling as it moves throughout your body. You may also have a metallic taste in your mouth. Both of these sensations will go away after the test is complete.  You may be asked to lie in different positions or place your legs or arms in different positions.  At the end of the procedure, you may be given IV fluids to help wash or flush the contrast out of your veins.  The IV will be removed, and pressure will be applied to the IV site to prevent bleeding. A bandage (dressing) may be  applied to the IV site. The exact procedure may vary among health care providers and hospitals. What can I expect after the procedure?  Your blood pressure, heart rate, breathing rate, and blood oxygen level will be monitored until you leave the hospital or clinic.  You may be given something to eat and drink.  You may have bruising or mild discomfort in the area where the IV was inserted. Follow these instructions at home: Eating and drinking   Follow instructions from your health care provider about eating or drinking restrictions.  Drink a lot of water for the first several days after the procedure, as directed by your health care provider. This helps to flush the contrast out of your body. Activity  Rest as told by your health  care provider.  Return to your normal activities as told by your health care provider. Ask your health care provider what activities are safe for you.  If you were given a sedative during your procedure, do not drive for 24 hours or until your health care provider approves. General instructions  Check your IV insertion area every day for signs of infection. Check for: ? Redness, swelling, or pain. ? Fluid or blood. ? Warmth. ? Pus or a bad smell.  Take over-the-counter and prescription medicines only as told by your health care provider.  Keep all follow-up visits as told by your health care provider. This is important. Contact a health care provider if:  Your skin becomes itchy or you develop a rash or hives.  You have a fever that does not get better with medicine.  You feel nauseous or you vomit.  You have redness, swelling, or pain around the insertion site.  You have fluid or blood coming from the insertion site.  Your insertion area feels warm to the touch.  You have pus or a bad smell coming from the insertion site. Get help right away if you:  Have shortness of breath or difficulty breathing.  Develop chest pain.  Faint.  Feel very dizzy. These symptoms may represent a serious problem that is an emergency. Do not wait to see if the symptoms will go away. Get medical help right away. Call your local emergency services (911 in the [place] Do not drive yourself to the hospital. Summary  A venogram, or venography, is a procedure that uses an X-ray and contrast dye to check how well the veins work and how blood flows through them.  An IV will be inserted into one of your veins in order to inject the contrast.  During the exam, you will lie on an X-ray table. The table may be tilted in different directions during the procedure to help the contrast move throughout your body. Safety straps will keep you secure.  After the procedure, you will need to drink a lot of  water to help wash or flush the contrast out of your body. This information is not intended to replace advice given to you by your health care provider. Make sure you discuss any questions you have with your health care provider. Document Revised: 11/16/2018 Document Reviewed: 11/16/2018 Elsevier Patient Education  [place].

## 2020-02-23 NOTE — Interval H&P Note (Signed)
History and Physical Interval Note:  02/23/2020 8:26 AM  Patrick Downs  has presented today for surgery, with the diagnosis of Bil Upper Extremity Venogram   ESRD   Pt to have Covid test on 02-20-20.  The various methods of treatment have been discussed with the patient and family. After consideration of risks, benefits and other options for treatment, the patient has consented to  Procedure(s): UPPER EXTREMITY VENOGRAPHY (Bilateral) as a surgical intervention.  The patient's history has been reviewed, patient examined, no change in status, stable for surgery.  I have reviewed the patient's chart and labs.  Questions were answered to the patient's satisfaction.     Leotis Pain

## 2020-02-23 NOTE — Op Note (Signed)
VEIN AND VASCULAR SURGERY   OPERATIVE NOTE  DATE: 02/23/2020  PRE-OPERATIVE DIAGNOSIS: 1. ESRD 2. Difficult dialysis access   POST-OPERATIVE DIAGNOSIS: same  PROCEDURE: 1.   Ultrasound guidance for vascular access to left basilic vein and right brachial vein 2.   Catheter placement into right subclavian vein from right brachial vein 3.   Left upper extremity venogram and superior venacavogram 4.   Right upper extremity venogram and superior venacavogram  SURGEON: Leotis Pain, MD  ASSISTANT(S): None  ANESTHESIA: Local with moderate conscious sedation for approximately 41 minutes using 4 mg of Versed and 100 mcg of Fentanyl  ESTIMATED BLOOD LOSS: 3 cc  FINDING(S): 1.  Left basilic vein was large but drained into the brachial vein in the mid upper arm making it not likely to be able to be transposed well.  The brachial and axillary veins were patent but there was occlusion of the left subclavian vein.  There was reconstitution of the left innominate vein and the superior vena cava appear to be patent around the catheter.  SPECIMEN(S):  None  INDICATIONS:   Patient is a 44 y.o.male who presents with a difficult dialysis access situation. The patient has a history of multiple access procedures, but now needs a new dialysis access. Upper extremity venograms are being performed to evaluate the adequacy of the upper extremities for an AV fistula or AV graft. Risks and benefits were discussed and informed consent was obtained.   DESCRIPTION: After obtaining full informed written consent, the patient was brought back to the vascular suite and placed in supine position with their arms extended bilaterally. Moderate conscious sedation was administered during a face to face encounter with the patient throughout the procedure with my supervision of the RN administering medicines and monitoring the patient's vital signs, pulse oximetry, telemetry and mental status throughout from the start  of the procedure until the patient was taken to the recovery room.  After obtaining adequate anesthesia, the patient was prepped and draped in the standard fashion in both upper extremities. I started on the left side. The left basilic vein was identified with ultrasound and found to be patent. It was then accessed under direct ultrasound guidance without difficulty with a micropuncture needle and a permanent image was recorded. A micropuncture wire and sheath were then placed. Imaging initially was performed through the micropuncture sheath which showed that the left basilic vein was large but drained into the brachial vein in the mid upper arm making it not likely to be able to be transposed well.  The brachial and axillary veins were patent but there was occlusion of the left subclavian vein.  There was reconstitution of the left innominate vein and the superior vena cava appear to be patent around the catheter.  I turned my attention to the right arm.  The right brachial vein was then identified with ultrasound and found to be patent at the antecubital fossa. This was accessed under direct ultrasound guidance with a micropuncture needle and a micropuncture wire and sheath were then placed.  Imaging was performed through the micropuncture sheath. This demonstrated the brachial vein to be fairly good size and patent to the right axillary and then subclavian vein.  The superior vena cava was not particularly well seen with the catheter in place nor was the right innominate vein so I placed a 6 French sheath and put a glide catheter into the right subclavian vein for selective imaging of the central veins.  There was only mild  narrowing around the catheter of less than 30% in superior vena cava and innominate vein and this did not appear flow-limiting.  It would appear as if we could do a loop forearm right arm AV graft or a right brachial artery to axillary vein AV graft.  At this point, I elected to terminate  the procedure. Both sheaths were removed and pressure were held at the access site in the upper extremities. The patient was awakened from anesthesia and taken to the recovery room in stable condition.  COMPLICATIONS: None  CONDITION: Stable

## 2020-02-24 DIAGNOSIS — L97514 Non-pressure chronic ulcer of other part of right foot with necrosis of bone: Secondary | ICD-10-CM | POA: Diagnosis not present

## 2020-02-24 DIAGNOSIS — Z992 Dependence on renal dialysis: Secondary | ICD-10-CM | POA: Diagnosis not present

## 2020-02-24 DIAGNOSIS — N2581 Secondary hyperparathyroidism of renal origin: Secondary | ICD-10-CM | POA: Diagnosis not present

## 2020-02-24 DIAGNOSIS — L97513 Non-pressure chronic ulcer of other part of right foot with necrosis of muscle: Secondary | ICD-10-CM | POA: Diagnosis not present

## 2020-02-24 DIAGNOSIS — N186 End stage renal disease: Secondary | ICD-10-CM | POA: Diagnosis not present

## 2020-02-25 ENCOUNTER — Other Ambulatory Visit: Payer: Self-pay | Admitting: Podiatry

## 2020-02-25 ENCOUNTER — Telehealth (INDEPENDENT_AMBULATORY_CARE_PROVIDER_SITE_OTHER): Payer: Self-pay

## 2020-02-25 NOTE — Telephone Encounter (Signed)
I attempted to contact the patient and a message was left for a return call. 

## 2020-02-26 DIAGNOSIS — Z992 Dependence on renal dialysis: Secondary | ICD-10-CM | POA: Diagnosis not present

## 2020-02-26 DIAGNOSIS — N2581 Secondary hyperparathyroidism of renal origin: Secondary | ICD-10-CM | POA: Diagnosis not present

## 2020-02-26 DIAGNOSIS — N186 End stage renal disease: Secondary | ICD-10-CM | POA: Diagnosis not present

## 2020-02-28 DIAGNOSIS — N186 End stage renal disease: Secondary | ICD-10-CM | POA: Diagnosis not present

## 2020-02-28 DIAGNOSIS — N2581 Secondary hyperparathyroidism of renal origin: Secondary | ICD-10-CM | POA: Diagnosis not present

## 2020-02-28 DIAGNOSIS — Z992 Dependence on renal dialysis: Secondary | ICD-10-CM | POA: Diagnosis not present

## 2020-03-01 ENCOUNTER — Inpatient Hospital Stay
Admission: RE | Admit: 2020-03-01 | Discharge: 2020-03-01 | Disposition: A | Discharge status: Home / Self Care | Payer: Medicare HMO | Source: Ambulatory Visit

## 2020-03-01 NOTE — Pre-Procedure Instructions (Addendum)
Dena from office notified unable to reach patient, several messages left , no return calls. New number from Dena answered denied knowing patient. No private info given out.

## 2020-03-01 NOTE — Pre-Procedure Instructions (Signed)
Several phone numbers available in chart not active. Left generic VM on ??cell. Able to reach emergency contact (mother), she stated she would try to reach Legrand Como and have him call PAT. Will attempt again later. If no answer will contact surgeon office.

## 2020-03-02 ENCOUNTER — Other Ambulatory Visit (INDEPENDENT_AMBULATORY_CARE_PROVIDER_SITE_OTHER): Payer: Self-pay | Admitting: Nurse Practitioner

## 2020-03-02 DIAGNOSIS — N2581 Secondary hyperparathyroidism of renal origin: Secondary | ICD-10-CM | POA: Diagnosis not present

## 2020-03-02 DIAGNOSIS — N186 End stage renal disease: Secondary | ICD-10-CM | POA: Diagnosis not present

## 2020-03-02 DIAGNOSIS — Z992 Dependence on renal dialysis: Secondary | ICD-10-CM | POA: Diagnosis not present

## 2020-03-02 NOTE — Telephone Encounter (Signed)
Spoke with someone at [facility] and explained that I have tried contacting the patient and have been unsuccessful. I was told that I could make the appt and send it to [facility] for the patient. Patient is scheduled with Dr. Lucky Cowboy for a right arm AV graft with Artegraft on 03/11/20 at the [facility]. Covid testing is not needed as he was positive on 01/25/20. Patient will have pre-op on 03/03/20. Pre-surgical instructions will be faxed to [facility].

## 2020-03-03 ENCOUNTER — Encounter
Admission: RE | Admit: 2020-03-03 | Discharge: 2020-03-03 | Disposition: A | Discharge status: Home / Self Care | Payer: Medicare HMO | Source: Ambulatory Visit | Attending: Podiatry | Admitting: Podiatry

## 2020-03-03 ENCOUNTER — Other Ambulatory Visit: Payer: Self-pay

## 2020-03-03 DIAGNOSIS — Z01812 Encounter for preprocedural laboratory examination: Secondary | ICD-10-CM | POA: Diagnosis not present

## 2020-03-03 HISTORY — DX: Polyneuropathy, unspecified: G62.9

## 2020-03-03 HISTORY — DX: Gastro-esophageal reflux disease without esophagitis: K21.9

## 2020-03-03 HISTORY — DX: Cardiac murmur, unspecified: R01.1

## 2020-03-03 HISTORY — DX: Carrier or suspected carrier of methicillin resistant Staphylococcus aureus: Z22.322

## 2020-03-03 HISTORY — DX: Heart failure, unspecified: I50.9

## 2020-03-03 HISTORY — DX: Hyperparathyroidism, unspecified: E21.3

## 2020-03-03 HISTORY — DX: Other intervertebral disc degeneration, lumbar region: M51.36

## 2020-03-03 HISTORY — DX: Other intervertebral disc degeneration, lumbar region without mention of lumbar back pain or lower extremity pain: M51.369

## 2020-03-03 LAB — CBC WITH DIFFERENTIAL/PLATELET
Abs Immature Granulocytes: 0.03 10*3/uL (ref 0.00–0.07)
Basophils Absolute: 0 10*3/uL (ref 0.0–0.1)
Basophils Relative: 1 %
Eosinophils Absolute: 0.1 10*3/uL (ref 0.0–0.5)
Eosinophils Relative: 2 %
HCT: 32.2 % — ABNORMAL LOW (ref 39.0–52.0)
Hemoglobin: 10.2 g/dL — ABNORMAL LOW (ref 13.0–17.0)
Immature Granulocytes: 1 %
Lymphocytes Relative: 28 %
Lymphs Abs: 1.5 10*3/uL (ref 0.7–4.0)
MCH: 28.7 pg (ref 26.0–34.0)
MCHC: 31.7 g/dL (ref 30.0–36.0)
MCV: 90.4 fL (ref 80.0–100.0)
Monocytes Absolute: 0.7 10*3/uL (ref 0.1–1.0)
Monocytes Relative: 13 %
Neutro Abs: 2.9 10*3/uL (ref 1.7–7.7)
Neutrophils Relative %: 55 %
Platelets: 153 10*3/uL (ref 150–400)
RBC: 3.56 MIL/uL — ABNORMAL LOW (ref 4.22–5.81)
RDW: 16.1 % — ABNORMAL HIGH (ref 11.5–15.5)
WBC: 5.2 10*3/uL (ref 4.0–10.5)
nRBC: 0 % (ref 0.0–0.2)

## 2020-03-03 LAB — TYPE AND SCREEN
ABO/RH(D): O POS
Antibody Screen: NEGATIVE

## 2020-03-03 LAB — BASIC METABOLIC PANEL
Anion gap: 16 — ABNORMAL HIGH (ref 5–15)
BUN: 57 mg/dL — ABNORMAL HIGH (ref 6–20)
CO2: 22 mmol/L (ref 22–32)
Calcium: 7.8 mg/dL — ABNORMAL LOW (ref 8.9–10.3)
Chloride: 102 mmol/L (ref 98–111)
Creatinine, Ser: 11.23 mg/dL — ABNORMAL HIGH (ref 0.61–1.24)
GFR, Estimated: 5 mL/min — ABNORMAL LOW (ref >60)
Glucose, Bld: 83 mg/dL (ref 70–99)
Potassium: 5.8 mmol/L — ABNORMAL HIGH (ref 3.5–5.1)
Sodium: 140 mmol/L (ref 135–145)

## 2020-03-03 LAB — APTT: aPTT: 42 seconds — ABNORMAL HIGH (ref 24–36)

## 2020-03-03 LAB — PROTIME-INR
INR: 1.1 (ref 0.8–1.2)
Prothrombin Time: 13.9 seconds (ref 11.4–15.2)

## 2020-03-03 NOTE — Patient Instructions (Addendum)
Your procedure is scheduled on: November 12 (Dr. Vickki Muff) and November 18 (Dr. Lucky Cowboy) Report to the Registration Desk on the 1st floor of the [facility]. To find out your arrival time, please call [phone] between 1PM - 3PM on: November 11 (Dr. Vickki Muff) and November 17 (Dr. Lucky Cowboy)  REMEMBER: Instructions that are not followed completely may result in serious medical risk, up to and including death; or upon the discretion of your surgeon and anesthesiologist your surgery may need to be rescheduled.  Do not eat food after midnight the night before surgery.  No gum chewing, lozengers or hard candies.  You may however, drink CLEAR liquids up to 2 hours before you are scheduled to arrive for your surgery. Do not drink anything within 2 hours of your scheduled arrival time.  Clear liquids include: - water  - apple juice without pulp - gatorade (not RED, PURPLE, OR BLUE) - black coffee or tea (Do NOT add milk or creamers to the coffee or tea) Do NOT drink anything that is not on this list.  In addition, Dr. Vickki Muff has ordered for you to drink the provided. For your surgery on November 12 with Dr. Vickki Muff only. Ensure Pre-Surgery Clear Carbohydrate Drink  Drinking this carbohydrate drink up to two hours before surgery helps to reduce insulin resistance and improve patient outcomes. Please complete drinking 2 hours prior to scheduled arrival time.  TAKE THESE MEDICATIONS THE MORNING OF SURGERY WITH A SIP OF WATER:  1.  Tylenol or Oxycodone if needed for pain 2.  Auryxia  One week prior to surgery: Stop Anti-inflammatories (NSAIDS) such as Advil, Aleve, Ibuprofen, Motrin, Naproxen, Naprosyn and Aspirin based products such as Excedrin, Goodys Powder, BC Powder. Stop ANY OVER THE COUNTER supplements until after surgery.  No Alcohol for 24 hours before or after surgery.  No Smoking including e-cigarettes for 24 hours prior to surgery.  No chewable tobacco products for at least 6 hours prior to  surgery.  No nicotine patches on the day of surgery.  Do not use any "recreational" drugs for at least a week prior to your surgery.  Please be advised that the combination of cocaine and anesthesia may have negative outcomes, up to and including death. If you test positive for cocaine, your surgery will be cancelled.  On the morning of surgery brush your teeth with toothpaste and water, you may rinse your mouth with mouthwash if you wish. Do not swallow any toothpaste or mouthwash.  Do not wear jewelry.  Do not wear lotions, powders, or perfumes.   Do not shave body from the neck down 48 hours prior to surgery just in case you cut yourself which could leave a site for infection.  Also, freshly shaved skin may become irritated if using the CHG soap.  Do not bring valuables to the hospital. [facility] is not responsible for any missing/lost belongings or valuables.   Use CHG Soap wipes as directed on instruction sheet.  Notify your doctor if there is any change in your medical condition (cold, fever, infection).  Wear comfortable clothing (specific to your surgery type) to the hospital.  Plan for stool softeners for home use; pain medications have a tendency to cause constipation. You can also help prevent constipation by eating foods high in fiber such as fruits and vegetables and drinking plenty of fluids as your diet allows.  After surgery, you can help prevent lung complications by doing breathing exercises.  Take deep breaths and cough every 1-2 hours.  Your doctor may order a device called an Incentive Spirometer to help you take deep breaths.  If you are being discharged the day of surgery, you will not be allowed to drive home. You will need a responsible adult (18 years or older) to drive you home and stay with you that night.   If you are taking public transportation, you will need to have a responsible adult (18 years or older) with you. Please confirm with your physician  that it is acceptable to use public transportation.   Please call the [facility] at [phone] if you have any questions about these instructions.  Visitation Policy:  Patients undergoing a surgery or procedure may have one family member or support person with them as long as that person is not COVID-19 positive or experiencing its symptoms.  That person may remain in the waiting area during the procedure.

## 2020-03-03 NOTE — Progress Notes (Signed)
  [facility] Perioperative Services: Pre-Admission/Anesthesia Testing  Abnormal Lab Notification   Date: 03/03/20  Name: AUGUSTIN BUN MRN:   [id number]  Re: Abnormal labs noted during PAT appointment   Provider(s) Notified: Samara Deist, DPM Notification mode: Routed and/or faxed via [facility]   ABNORMAL LAB VALUE(S): Lab Results  Component Value Date   K 5.8 (H) 03/03/2020    Notes:  Patient currently on hemodialysis. Estimated Creatinine Clearance: 11.6 mL/min (A) (by C-G formula based on SCr of 11.23 mg/dL (H)). Patient is scheduled for a AMPUTATION TOE IPJ X 2 RIGHT GREAT AND 3RD (Right Toe) on 03/05/2020. This is a Community education officer; no formal response is required.  Honor Loh, MSN, APRN, FNP-C, CEN [facility]  Peri-operative Services Nurse Practitioner Phone: [phone] 03/03/20 4:08 PM

## 2020-03-04 DIAGNOSIS — Z992 Dependence on renal dialysis: Secondary | ICD-10-CM | POA: Diagnosis not present

## 2020-03-04 DIAGNOSIS — N186 End stage renal disease: Secondary | ICD-10-CM | POA: Diagnosis not present

## 2020-03-04 DIAGNOSIS — N2581 Secondary hyperparathyroidism of renal origin: Secondary | ICD-10-CM | POA: Diagnosis not present

## 2020-03-05 ENCOUNTER — Ambulatory Visit: Payer: Medicare HMO | Admitting: Anesthesiology

## 2020-03-05 ENCOUNTER — Ambulatory Visit
Admission: RE | Admit: 2020-03-05 | Discharge: 2020-03-05 | Disposition: A | Discharge status: Home / Self Care | Payer: Medicare HMO | Attending: Podiatry | Admitting: Podiatry

## 2020-03-05 ENCOUNTER — Ambulatory Visit: Payer: Medicare HMO

## 2020-03-05 ENCOUNTER — Other Ambulatory Visit: Payer: Self-pay

## 2020-03-05 ENCOUNTER — Encounter: Payer: Self-pay | Admitting: Podiatry

## 2020-03-05 ENCOUNTER — Encounter
Admission: RE | Disposition: A | Discharge status: Home / Self Care | Payer: Self-pay | Source: Home / Self Care | Attending: Podiatry

## 2020-03-05 DIAGNOSIS — Z7951 Long term (current) use of inhaled steroids: Secondary | ICD-10-CM | POA: Diagnosis not present

## 2020-03-05 DIAGNOSIS — D631 Anemia in chronic kidney disease: Secondary | ICD-10-CM | POA: Diagnosis not present

## 2020-03-05 DIAGNOSIS — E892 Postprocedural hypoparathyroidism: Secondary | ICD-10-CM | POA: Diagnosis not present

## 2020-03-05 DIAGNOSIS — N186 End stage renal disease: Secondary | ICD-10-CM | POA: Diagnosis not present

## 2020-03-05 DIAGNOSIS — Z91041 Radiographic dye allergy status: Secondary | ICD-10-CM | POA: Insufficient documentation

## 2020-03-05 DIAGNOSIS — I96 Gangrene, not elsewhere classified: Secondary | ICD-10-CM | POA: Insufficient documentation

## 2020-03-05 DIAGNOSIS — Z833 Family history of diabetes mellitus: Secondary | ICD-10-CM | POA: Insufficient documentation

## 2020-03-05 DIAGNOSIS — E1152 Type 2 diabetes mellitus with diabetic peripheral angiopathy with gangrene: Secondary | ICD-10-CM | POA: Insufficient documentation

## 2020-03-05 DIAGNOSIS — E1142 Type 2 diabetes mellitus with diabetic polyneuropathy: Secondary | ICD-10-CM | POA: Diagnosis not present

## 2020-03-05 DIAGNOSIS — Z885 Allergy status to narcotic agent status: Secondary | ICD-10-CM | POA: Insufficient documentation

## 2020-03-05 DIAGNOSIS — I509 Heart failure, unspecified: Secondary | ICD-10-CM | POA: Insufficient documentation

## 2020-03-05 DIAGNOSIS — Z89421 Acquired absence of other right toe(s): Secondary | ICD-10-CM | POA: Diagnosis not present

## 2020-03-05 DIAGNOSIS — Z79899 Other long term (current) drug therapy: Secondary | ICD-10-CM | POA: Insufficient documentation

## 2020-03-05 DIAGNOSIS — L97519 Non-pressure chronic ulcer of other part of right foot with unspecified severity: Secondary | ICD-10-CM | POA: Diagnosis not present

## 2020-03-05 DIAGNOSIS — E1122 Type 2 diabetes mellitus with diabetic chronic kidney disease: Secondary | ICD-10-CM | POA: Insufficient documentation

## 2020-03-05 DIAGNOSIS — I132 Hypertensive heart and chronic kidney disease with heart failure and with stage 5 chronic kidney disease, or end stage renal disease: Secondary | ICD-10-CM | POA: Diagnosis not present

## 2020-03-05 DIAGNOSIS — L97514 Non-pressure chronic ulcer of other part of right foot with necrosis of bone: Secondary | ICD-10-CM | POA: Diagnosis not present

## 2020-03-05 DIAGNOSIS — Z888 Allergy status to other drugs, medicaments and biological substances status: Secondary | ICD-10-CM | POA: Insufficient documentation

## 2020-03-05 DIAGNOSIS — K219 Gastro-esophageal reflux disease without esophagitis: Secondary | ICD-10-CM | POA: Insufficient documentation

## 2020-03-05 DIAGNOSIS — L97512 Non-pressure chronic ulcer of other part of right foot with fat layer exposed: Secondary | ICD-10-CM | POA: Diagnosis not present

## 2020-03-05 DIAGNOSIS — Z992 Dependence on renal dialysis: Secondary | ICD-10-CM | POA: Insufficient documentation

## 2020-03-05 DIAGNOSIS — R011 Cardiac murmur, unspecified: Secondary | ICD-10-CM | POA: Insufficient documentation

## 2020-03-05 HISTORY — PX: AMPUTATION TOE: SHX6595

## 2020-03-05 LAB — POCT I-STAT, CHEM 8
BUN: 43 mg/dL — ABNORMAL HIGH (ref 6–20)
Calcium, Ion: 0.88 mmol/L — CL (ref 1.15–1.40)
Chloride: 106 mmol/L (ref 98–111)
Creatinine, Ser: 11.7 mg/dL — ABNORMAL HIGH (ref 0.61–1.24)
Glucose, Bld: 174 mg/dL — ABNORMAL HIGH (ref 70–99)
HCT: 34 % — ABNORMAL LOW (ref 39.0–52.0)
Hemoglobin: 11.6 g/dL — ABNORMAL LOW (ref 13.0–17.0)
Potassium: 5.2 mmol/L — ABNORMAL HIGH (ref 3.5–5.1)
Sodium: 139 mmol/L (ref 135–145)
TCO2: 21 mmol/L — ABNORMAL LOW (ref 22–32)

## 2020-03-05 SURGERY — AMPUTATION, TOE
Anesthesia: General | Site: Toe | Laterality: Right

## 2020-03-05 MED ORDER — FAMOTIDINE 20 MG PO TABS
ORAL_TABLET | ORAL | Status: AC
  Filled 2020-03-05: qty 1

## 2020-03-05 MED ORDER — PROPOFOL 10 MG/ML IV BOLUS
INTRAVENOUS | Status: AC
  Filled 2020-03-05: qty 20

## 2020-03-05 MED ORDER — SODIUM CHLORIDE 0.9 % IV SOLN: INTRAVENOUS | Status: DC

## 2020-03-05 MED ORDER — MIDAZOLAM HCL 2 MG/2ML IJ SOLN
INTRAMUSCULAR | Status: DC | PRN
  Administered 2020-03-05: 2 mg via INTRAVENOUS

## 2020-03-05 MED ORDER — BUPIVACAINE HCL (PF) 0.25 % IJ SOLN
INTRAMUSCULAR | Status: AC
  Filled 2020-03-05: qty 60

## 2020-03-05 MED ORDER — FAMOTIDINE 20 MG PO TABS
20.0000 mg | ORAL_TABLET | Freq: Once | ORAL | Status: AC
  Administered 2020-03-05: 20 mg via ORAL

## 2020-03-05 MED ORDER — ONDANSETRON HCL 4 MG PO TABS: 4.0000 mg | ORAL_TABLET | Freq: Four times a day (QID) | ORAL | Status: DC | PRN

## 2020-03-05 MED ORDER — MIDAZOLAM HCL 2 MG/2ML IJ SOLN
INTRAMUSCULAR | Status: AC
  Filled 2020-03-05: qty 2

## 2020-03-05 MED ORDER — ONDANSETRON HCL 4 MG/2ML IJ SOLN: 4.0000 mg | Freq: Four times a day (QID) | INTRAMUSCULAR | Status: DC | PRN

## 2020-03-05 MED ORDER — BUPIVACAINE LIPOSOME 1.3 % IJ SUSP
INTRAMUSCULAR | Status: AC
  Filled 2020-03-05: qty 40

## 2020-03-05 MED ORDER — POVIDONE-IODINE 10 % EX SWAB
2.0000 "application " | Freq: Once | CUTANEOUS | Status: AC
  Administered 2020-03-05: 2 via TOPICAL

## 2020-03-05 MED ORDER — PROPOFOL 500 MG/50ML IV EMUL
INTRAVENOUS | Status: DC | PRN
  Administered 2020-03-05: 50 ug/kg/min via INTRAVENOUS

## 2020-03-05 MED ORDER — CEFAZOLIN SODIUM-DEXTROSE 2-4 GM/100ML-% IV SOLN
INTRAVENOUS | Status: AC
  Filled 2020-03-05: qty 100

## 2020-03-05 MED ORDER — CEFAZOLIN SODIUM-DEXTROSE 2-4 GM/100ML-% IV SOLN
2.0000 g | INTRAVENOUS | Status: AC
  Administered 2020-03-05: 2 g via INTRAVENOUS

## 2020-03-05 MED ORDER — FENTANYL CITRATE (PF) 100 MCG/2ML IJ SOLN: 25.0000 ug | INTRAMUSCULAR | Status: DC | PRN

## 2020-03-05 MED ORDER — CHLORHEXIDINE GLUCONATE 0.12 % MT SOLN
OROMUCOSAL | Status: AC
  Administered 2020-03-05: 15 mL via OROMUCOSAL
  Filled 2020-03-05: qty 15

## 2020-03-05 MED ORDER — FENTANYL CITRATE (PF) 100 MCG/2ML IJ SOLN
INTRAMUSCULAR | Status: DC | PRN
  Administered 2020-03-05: 25 ug via INTRAVENOUS

## 2020-03-05 MED ORDER — FENTANYL CITRATE (PF) 100 MCG/2ML IJ SOLN
INTRAMUSCULAR | Status: AC
  Filled 2020-03-05: qty 2

## 2020-03-05 MED ORDER — CHLORHEXIDINE GLUCONATE 0.12 % MT SOLN: 15.0000 mL | Freq: Once | OROMUCOSAL | Status: AC

## 2020-03-05 MED ORDER — BUPIVACAINE HCL 0.5 % IJ SOLN
INTRAMUSCULAR | Status: DC | PRN
  Administered 2020-03-05: 10 mL

## 2020-03-05 MED ORDER — ORAL CARE MOUTH RINSE: 15.0000 mL | Freq: Once | OROMUCOSAL | Status: AC

## 2020-03-05 SURGICAL SUPPLY — 49 items
BLADE OSC/SAGITTAL MD 5.5X18 (BLADE) ×2 IMPLANT
BLADE OSC/SAGITTAL MD 9X18.5 (BLADE) ×2 IMPLANT
BLADE SURG MINI STRL (BLADE) ×2 IMPLANT
BNDG CMPR STD VLCR NS LF 5.8X4 (GAUZE/BANDAGES/DRESSINGS) ×1
BNDG COHESIVE 6X5 TAN STRL LF (GAUZE/BANDAGES/DRESSINGS) ×2 IMPLANT
BNDG CONFORM 2 STRL LF (GAUZE/BANDAGES/DRESSINGS) ×2 IMPLANT
BNDG CONFORM 3 STRL LF (GAUZE/BANDAGES/DRESSINGS) ×4 IMPLANT
BNDG ELASTIC 4X5.8 VLCR NS LF (GAUZE/BANDAGES/DRESSINGS) ×2 IMPLANT
BNDG ESMARK 4X12 TAN STRL LF (GAUZE/BANDAGES/DRESSINGS) ×2 IMPLANT
BNDG GAUZE 4.5X4.1 6PLY STRL (MISCELLANEOUS) ×2 IMPLANT
CANISTER SUCT 1200ML W/VALVE (MISCELLANEOUS) ×2 IMPLANT
COVER WAND RF STERILE (DRAPES) ×2 IMPLANT
CUFF TOURN SGL QUICK 12 (TOURNIQUET CUFF) IMPLANT
CUFF TOURN SGL QUICK 18X4 (TOURNIQUET CUFF) ×2 IMPLANT
DRAPE FLUOR MINI C-ARM 54X84 (DRAPES) ×2 IMPLANT
DRAPE XRAY CASSETTE 23X24 (DRAPES) ×2 IMPLANT
DURAPREP 26ML APPLICATOR (WOUND CARE) ×2 IMPLANT
ELECT REM PT RETURN 9FT ADLT (ELECTROSURGICAL) ×2
ELECTRODE REM PT RTRN 9FT ADLT (ELECTROSURGICAL) ×1 IMPLANT
GAUZE PACKING IODOFORM 1/2 (PACKING) ×2 IMPLANT
GAUZE SPONGE 4X4 12PLY STRL (GAUZE/BANDAGES/DRESSINGS) ×2 IMPLANT
GAUZE XEROFORM 1X8 LF (GAUZE/BANDAGES/DRESSINGS) ×2 IMPLANT
GLOVE BIO SURGEON STRL SZ7.5 (GLOVE) ×2 IMPLANT
GLOVE INDICATOR 8.0 STRL GRN (GLOVE) ×2 IMPLANT
GOWN STRL REUS W/ TWL XL LVL3 (GOWN DISPOSABLE) ×2 IMPLANT
GOWN STRL REUS W/TWL XL LVL3 (GOWN DISPOSABLE) ×4
KIT TURNOVER KIT A (KITS) ×2 IMPLANT
LABEL OR SOLS (LABEL) ×2 IMPLANT
MANIFOLD NEPTUNE II (INSTRUMENTS) ×2 IMPLANT
NEEDLE FILTER BLUNT 18X 1/2SAF (NEEDLE) ×1
NEEDLE FILTER BLUNT 18X1 1/2 (NEEDLE) ×1 IMPLANT
NEEDLE HYPO 25X1 1.5 SAFETY (NEEDLE) ×2 IMPLANT
NS IRRIG 500ML POUR BTL (IV SOLUTION) ×2 IMPLANT
PACK EXTREMITY (MISCELLANEOUS) ×2 IMPLANT
PAD ABD DERMACEA PRESS 5X9 (GAUZE/BANDAGES/DRESSINGS) ×4 IMPLANT
PULSAVAC PLUS IRRIG FAN TIP (DISPOSABLE) ×2
SHIELD FULL FACE ANTIFOG 7M (MISCELLANEOUS) ×2 IMPLANT
SOL .9 NS 3000ML IRR  AL (IV SOLUTION) ×1
SOL .9 NS 3000ML IRR AL (IV SOLUTION) ×1
SOL .9 NS 3000ML IRR UROMATIC (IV SOLUTION) ×1 IMPLANT
STOCKINETTE M/LG 89821 (MISCELLANEOUS) ×2 IMPLANT
STRAP SAFETY 5IN WIDE (MISCELLANEOUS) ×2 IMPLANT
SUT ETHILON 3-0 FS-10 30 BLK (SUTURE) ×2
SUT ETHILON 5-0 FS-2 18 BLK (SUTURE) ×2 IMPLANT
SUT VIC AB 4-0 FS2 27 (SUTURE) ×2 IMPLANT
SUTURE EHLN 3-0 FS-10 30 BLK (SUTURE) ×1 IMPLANT
SYR 10ML LL (SYRINGE) ×6 IMPLANT
SYR BULB IRRIG 60ML STRL (SYRINGE) ×2 IMPLANT
TIP FAN IRRIG PULSAVAC PLUS (DISPOSABLE) ×1 IMPLANT

## 2020-03-05 NOTE — Transfer of Care (Signed)
Immediate Anesthesia Transfer of Care Note  Patient: Patrick Downs  Procedure(s) Performed: AMPUTATION TOE IPJ X 2 RIGHT GREAT AND 3RD (Right Toe)  Patient Location: PACU  Anesthesia Type:General  Level of Consciousness: awake, alert  and oriented  Airway & Oxygen Therapy: Patient Spontanous Breathing and Patient connected to nasal cannula oxygen  Post-op Assessment: Report given to RN and Post -op Vital signs reviewed and stable  Post vital signs: Reviewed and stable  Last Vitals:  Vitals Value Taken Time  BP 127/85 03/05/20 1312  Temp 36.2 C 03/05/20 1312  Pulse 83 03/05/20 1312  Resp 12 03/05/20 1312  SpO2 100 % 03/05/20 1312    Last Pain:  Vitals:   03/05/20 1026  TempSrc: Oral  PainSc: 8          Complications: No complications documented.

## 2020-03-05 NOTE — Discharge Instructions (Addendum)
[facility]  POST OPERATIVE INSTRUCTIONS FOR DR. TROXLER, DR. FOWLER, AND DR. [facility] PODIATRY DEPARTMENT   1. Take your medication as prescribed.  Pain medication should be taken only as needed.  2. Keep the dressing clean, dry and intact.  3. Keep your foot elevated above the heart level for the first 48 hours.  4. Walking to the bathroom and brief periods of walking are acceptable, unless we have instructed you to be non-weight bearing.  5. Always wear your post-op shoe when walking.  Always use your crutches if you are to be non-weight bearing.  6. Do not take a shower. Baths are permissible as long as the foot is kept out of the water.   7. Every hour you are awake:  - Bend your knee 15 times. - Flex foot 15 times - Massage calf 15 times  8. Call [facility] ([phone]) if any of the following problems occur: - You develop a temperature or fever. - The bandage becomes saturated with blood. - Medication does not stop your pain. - Injury of the foot occurs. - Any symptoms of infection including redness, odor, or red streaks running from wound.  AMBULATORY SURGERY  DISCHARGE INSTRUCTIONS  1) The drugs that you were given will stay in your system until tomorrow so for the next 24 hours you should not: A) Drive an automobile B) Make any legal decisions C) Drink any alcoholic beverage  2) You may resume regular meals tomorrow.  Today it is better to start with liquids and gradually work up to solid foods. You may eat anything you prefer, but it is better to start with liquids, then soup and crackers, and gradually work up to solid foods.  3) Please notify your doctor immediately if you have any unusual bleeding, trouble breathing, redness and pain at the surgery site, drainage, fever, or pain not relieved by medication.  4) Additional Instructions:   Please contact your physician with any problems or Same  Day Surgery at [phone], Monday through Friday 6 am to 4 pm, or  at [facility] Main number at [phone]. 

## 2020-03-05 NOTE — Anesthesia Postprocedure Evaluation (Signed)
Anesthesia Post Note  Patient: Patrick Downs  Procedure(s) Performed: AMPUTATION TOE IPJ X 2 RIGHT GREAT AND 3RD (Right Toe)  Patient location during evaluation: PACU Anesthesia Type: General Level of consciousness: awake and alert Pain management: pain level controlled Vital Signs Assessment: post-procedure vital signs reviewed and stable Respiratory status: spontaneous breathing, nonlabored ventilation, respiratory function stable and patient connected to nasal cannula oxygen Cardiovascular status: blood pressure returned to baseline and stable Postop Assessment: no apparent nausea or vomiting Anesthetic complications: no   No complications documented.   Last Vitals:  Vitals:   03/05/20 1414 03/05/20 1434  BP: (!) 145/70 132/88  Pulse: 85 82  Resp: 16 18  Temp:    SpO2: 97% 98%    Last Pain:  Vitals:   03/05/20 1434  TempSrc:   PainSc: 0-No pain                 Martha Clan

## 2020-03-05 NOTE — Progress Notes (Signed)
Dr. Rosey Bath aware of pt K 5.2

## 2020-03-05 NOTE — Anesthesia Preprocedure Evaluation (Signed)
Anesthesia Evaluation  Patient identified by MRN, date of birth, ID band Patient awake    Reviewed: Allergy & Precautions, H&P , NPO status , Patient's Chart, lab work & pertinent test results  History of Anesthesia Complications Negative for: history of anesthetic complications  Airway Mallampati: III  TM Distance: >3 FB     Dental  (+) Chipped, Dental Advidsory Given   Pulmonary neg shortness of breath, neg COPD, Recent URI  (Covid last month), Resolved,           Cardiovascular hypertension, (-) angina(-) Past MI and (-) Cardiac Stents (-) dysrhythmias + Valvular Problems/Murmurs (as a child)      Neuro/Psych neg Seizures PSYCHIATRIC DISORDERS Anxiety Depression  Neuromuscular disease (h/o Guillan-Barre syndrome, polyneuropathy)    GI/Hepatic Neg liver ROS, GERD  Controlled,  Endo/Other  negative endocrine ROS  Renal/GU Dialysis and ESRFRenal disease  negative genitourinary   Musculoskeletal   Abdominal   Peds  Hematology  (+) Blood dyscrasia, anemia ,   Anesthesia Other Findings Past Medical History: No date: Depression No date: Dialysis patient ([place]) No date: ESRD (end stage renal disease) on dialysis ([place]) No date: Failure to thrive in adult No date: Gout No date: Guillain Barr syndrome ([place]) No date: HTN (hypertension) No date: Kidney failure No date: Pneumonia No date: Renal insufficiency No date: Respiratory failure [facility])  Past Surgical History: 01/07/2018: A/V FISTULAGRAM; Right     Comment:  Procedure: A/V FISTULAGRAM;  Surgeon: Algernon Huxley, MD;               Location: [facility];  Service: Cardiovascular;              Laterality: Right; 06/08/2017: AMPUTATION TOE; Right     Comment:  Procedure: AMPUTATION TOE RIGHT FIFTH TOE;  Surgeon:               Samara Deist, DPM;  Location: [facility] ORS;  Service:               Podiatry;  Laterality: Right; No date: AV FISTULA PLACEMENT      Comment:  x5      2 graphs No date: PARATHYROIDECTOMY No date: RENAL BIOPSY 02/07/2018: REVISON OF ARTERIOVENOUS FISTULA; Right     Comment:  Procedure: REVISON OF ARTERIOVENOUS FISTULA;  Surgeon:               Algernon Huxley, MD;  Location: [facility] ORS;  Service:               Vascular;  Laterality: Right; 01/22/2018: TEE WITHOUT CARDIOVERSION; N/A     Comment:  Procedure: TRANSESOPHAGEAL ECHOCARDIOGRAM (TEE);                Surgeon: Dionisio David, MD;  Location: [facility] ORS;                Service: Cardiovascular;  Laterality: N/A; No date: tonsiilectomy No date: tracheotomy  BMI    Body Mass Index:  32.30 kg/m      Reproductive/Obstetrics negative OB ROS                             Anesthesia Physical  Anesthesia Plan  ASA: III  Anesthesia Plan: General   Post-op Pain Management:    Induction: Intravenous  PONV Risk Score and Plan: 2 and Propofol infusion and TIVA  Airway Management Planned: Natural Airway and Simple Face Mask  Additional Equipment:  Intra-op Plan:   Post-operative Plan:   Informed Consent: I have reviewed the patients History and Physical, chart, labs and discussed the procedure including the risks, benefits and alternatives for the proposed anesthesia with the patient or authorized representative who has indicated his/her understanding and acceptance.     Dental Advisory Given  Plan Discussed with: Anesthesiologist and CRNA  Anesthesia Plan Comments:         Anesthesia Quick Evaluation

## 2020-03-05 NOTE — Anesthesia Procedure Notes (Signed)
Date/Time: 03/05/2020 12:30 PM Performed by: Allean Found, CRNA Pre-anesthesia Checklist: Patient identified, Emergency Drugs available, Suction available, Patient being monitored and Timeout performed Patient Re-evaluated:Patient Re-evaluated prior to induction Oxygen Delivery Method: Nasal cannula Preoxygenation: Pre-oxygenation with 100% oxygen Placement Confirmation: positive ETCO2

## 2020-03-05 NOTE — OR Nursing (Signed)
Per Dr. Vickki Muff, telephone, was ok to apply patient's own postop shoe prior to discharge.  Also, weight bearing as tolerated.  (wearing shoe when walking and weight bearing as tolerated discussed with patient during discharge teaching)

## 2020-03-05 NOTE — Progress Notes (Signed)
PT does not make urine due to renal failure. Dr. Rosey Bath made aware, no UDS needed.

## 2020-03-05 NOTE — Op Note (Signed)
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  Local consisted of a total of 10 cc of 0.25% bupivacaine    Hemostasis: None    Specimen: Amputation distal right great toe and third toe    Complications: None    Operative indications:Ponciano S Carter is an 44 y.o. that presents today for surgical intervention.  The risks/benefits/alternatives/complications have been discussed and consent has been given.    Procedure:  Patient was brought into the OR and placed on the operating table in thesupine position. After anesthesia was obtained theright lower extremity was prepped and draped in usual sterile fashion.  Attention was directed to the right great toe and third toe.  Dorsal and plantar skin flaps were created at the level of the interphalangeal joints.  Full-thickness flaps were created down to the level of the joint and the toe was then disarticulated.  This was at the interphalangeal joint of the left great toe and the proximal interphalangeal joint of the right third toe.  A small amount of residual distal phalanx was then removed for primary closure of the skin flap to the great toe.  The wounds were then flushed with copious amounts of irrigation.  Closure was then performed with combination of 3-0 Vicryl and 3-0 nylon.    Patient tolerated the procedure and anesthesia well.  Was transported from the OR to the PACU with all vital signs stable and vascular status intact. To be discharged per routine protocol.  Will follow up in approximately 1 week in the outpatient clinic.

## 2020-03-05 NOTE — H&P (Signed)
HISTORY AND PHYSICAL INTERVAL NOTE:  03/05/2020  11:56 AM  Patrick Downs  has presented today for surgery, with the diagnosis of M97.514 ULCER OF RIGHT FOOT.  The various methods of treatment have been discussed with the patient.  No guarantees were given.  After consideration of risks, benefits and other options for treatment, the patient has consented to surgery.  I have reviewed the patients' chart and labs.     A history and physical examination was performed in my office.  The patient was reexamined.  There have been no changes to this history and physical examination.  Samara Deist A

## 2020-03-06 ENCOUNTER — Encounter: Payer: Self-pay | Admitting: Podiatry

## 2020-03-06 DIAGNOSIS — N2581 Secondary hyperparathyroidism of renal origin: Secondary | ICD-10-CM | POA: Diagnosis not present

## 2020-03-06 DIAGNOSIS — Z992 Dependence on renal dialysis: Secondary | ICD-10-CM | POA: Diagnosis not present

## 2020-03-06 DIAGNOSIS — N186 End stage renal disease: Secondary | ICD-10-CM | POA: Diagnosis not present

## 2020-03-08 ENCOUNTER — Ambulatory Visit: Payer: Medicare HMO | Attending: Anesthesiology | Admitting: Anesthesiology

## 2020-03-08 ENCOUNTER — Other Ambulatory Visit: Payer: Self-pay

## 2020-03-08 ENCOUNTER — Encounter: Payer: Self-pay | Admitting: Anesthesiology

## 2020-03-08 DIAGNOSIS — M545 Low back pain, unspecified: Secondary | ICD-10-CM

## 2020-03-08 DIAGNOSIS — R29898 Other symptoms and signs involving the musculoskeletal system: Secondary | ICD-10-CM | POA: Diagnosis not present

## 2020-03-08 DIAGNOSIS — F1190 Opioid use, unspecified, uncomplicated: Secondary | ICD-10-CM | POA: Diagnosis not present

## 2020-03-08 DIAGNOSIS — G894 Chronic pain syndrome: Secondary | ICD-10-CM | POA: Diagnosis not present

## 2020-03-08 DIAGNOSIS — I770 Arteriovenous fistula, acquired: Secondary | ICD-10-CM | POA: Diagnosis not present

## 2020-03-08 DIAGNOSIS — M5136 Other intervertebral disc degeneration, lumbar region: Secondary | ICD-10-CM

## 2020-03-08 DIAGNOSIS — M47816 Spondylosis without myelopathy or radiculopathy, lumbar region: Secondary | ICD-10-CM | POA: Diagnosis not present

## 2020-03-08 DIAGNOSIS — G610 Guillain-Barre syndrome: Secondary | ICD-10-CM | POA: Diagnosis not present

## 2020-03-08 DIAGNOSIS — G8929 Other chronic pain: Secondary | ICD-10-CM

## 2020-03-08 DIAGNOSIS — N186 End stage renal disease: Secondary | ICD-10-CM

## 2020-03-08 DIAGNOSIS — Z992 Dependence on renal dialysis: Secondary | ICD-10-CM

## 2020-03-08 MED ORDER — OXYCODONE-ACETAMINOPHEN 10-325 MG PO TABS: 1.0000 | ORAL_TABLET | ORAL | 0 refills | Status: DC | PRN

## 2020-03-08 MED ORDER — OXYCODONE-ACETAMINOPHEN 10-325 MG PO TABS
1.0000 | ORAL_TABLET | ORAL | 0 refills | Status: DC | PRN
Start: 2020-04-10 — End: 2020-05-04

## 2020-03-09 ENCOUNTER — Other Ambulatory Visit: Payer: Medicare HMO

## 2020-03-09 DIAGNOSIS — N2581 Secondary hyperparathyroidism of renal origin: Secondary | ICD-10-CM | POA: Diagnosis not present

## 2020-03-09 DIAGNOSIS — Z992 Dependence on renal dialysis: Secondary | ICD-10-CM | POA: Diagnosis not present

## 2020-03-09 DIAGNOSIS — N186 End stage renal disease: Secondary | ICD-10-CM | POA: Diagnosis not present

## 2020-03-09 LAB — SURGICAL PATHOLOGY

## 2020-03-10 DIAGNOSIS — L97514 Non-pressure chronic ulcer of other part of right foot with necrosis of bone: Secondary | ICD-10-CM | POA: Diagnosis not present

## 2020-03-10 DIAGNOSIS — L97513 Non-pressure chronic ulcer of other part of right foot with necrosis of muscle: Secondary | ICD-10-CM | POA: Diagnosis not present

## 2020-03-10 MED ORDER — PROMETHAZINE HCL 25 MG/ML IJ SOLN: 12.5000 mg | Freq: Four times a day (QID) | INTRAMUSCULAR | Status: DC | PRN

## 2020-03-10 MED ORDER — SODIUM CHLORIDE 0.9 % IV SOLN: INTRAVENOUS | Status: DC

## 2020-03-10 MED ORDER — CHLORHEXIDINE GLUCONATE 0.12 % MT SOLN: 15.0000 mL | Freq: Once | OROMUCOSAL | Status: DC

## 2020-03-10 MED ORDER — CEFAZOLIN SODIUM-DEXTROSE 1-4 GM/50ML-% IV SOLN: 1.0000 g | INTRAVENOUS | Status: DC

## 2020-03-10 MED ORDER — FAMOTIDINE 20 MG PO TABS: 20.0000 mg | ORAL_TABLET | Freq: Once | ORAL | Status: DC

## 2020-03-10 MED ORDER — ORAL CARE MOUTH RINSE: 15.0000 mL | Freq: Once | OROMUCOSAL | Status: DC

## 2020-03-10 MED ORDER — FENTANYL CITRATE (PF) 100 MCG/2ML IJ SOLN: 12.5000 ug | Freq: Once | INTRAMUSCULAR | Status: DC | PRN

## 2020-03-10 NOTE — Progress Notes (Signed)
Virtual Visit via Telephone Note  I connected with Patrick Downs on 03/10/20 at  1:30 PM EST by telephone and verified that I am speaking with the correct person using two identifiers.  Location: Patient: Home Provider: Pain control center   I discussed the limitations, risks, security and privacy concerns of performing an evaluation and management service by telephone and the availability of in person appointments. I also discussed with the patient that there may be a patient responsible charge related to this service. The patient expressed understanding and agreed to proceed.   History of Present Illness: I spoke with Patrick Downs via telephone as he was unable to do the video portion of the virtual conference.  He reports that his low back pain and lower extremity pain is stable in nature with no significant changes recently reported.  He says medications as prescribed and these continue to give him good relief.  He denies any side effects with the medications.  Quality characteristic and distribution of his low back pain have been stable in nature without change in lower extremity strength or function.  He reports that he continues to get good relief with his medicines generally lasting about 4 to 6 hours before asked to redose his medicines.  Unfortunately he has failed to gain significant improvement with either more conservative measures.  He also reports that he has had some pain following her recent surgery.   Observations/Objective:  Current Outpatient Medications:  .  acetaminophen (TYLENOL) 500 MG tablet, Take 1,000-1,500 mg by mouth 2 (two) times daily as needed for moderate pain or headache., Disp: , Rfl:  .  AURYXIA 1 GM 210 MG(Fe) tablet, Take 420 mg by mouth 3 (three) times daily., Disp: , Rfl:  .  gabapentin (NEURONTIN) 300 MG capsule, Take 600 mg by mouth at bedtime. , Disp: , Rfl:  .  midodrine (PROAMATINE) 5 MG tablet, Take 5-10 mg by mouth See admin instructions. Take 10 mg by  mouth before dialysis on Tuesdays, Thursdays, & Saturday and take 5 mg in the evening on  Tuesdays, Thursdays, & Saturdays. On Monday, Wednesday, Friday and Sunday 5 mg by mouth every 8 hours., Disp: , Rfl:  .  [START ON 03/12/2020] oxyCODONE-acetaminophen (PERCOCET) 10-325 MG tablet, Take 1 tablet by mouth every 4 (four) hours as needed for pain., Disp: 120 tablet, Rfl: 0 .  [START ON 04/10/2020] oxyCODONE-acetaminophen (PERCOCET) 10-325 MG tablet, Take 1 tablet by mouth every 4 (four) hours as needed for pain., Disp: 120 tablet, Rfl: 0  Assessment and Plan:  1. DDD (degenerative disc disease), lumbar   2. Chronic bilateral low back pain without sciatica   3. Facet arthritis of lumbar region   4. ESRD on dialysis ([place])   5. Guillain-Barre syndrome ([place])   6. Chronic pain syndrome   7. Chronic, continuous use of opioids   8. Weakness of both lower extremities   9. A-V fistula ([place])   I have reviewed the [facility] practitioner database information is appropriate for refills.  These be dated for November and December.  We will schedule him for a 22-month return to clinic.  He has been compliant with his medications getting good relief no other changes will be made in his protocol today.  I have encouraged him to continue follow-up with his primary care physicians for his baseline medical care. Follow Up Instructions:    I discussed the assessment and treatment plan with the patient. The patient was provided an opportunity to ask questions and  all were answered. The patient agreed with the plan and demonstrated an understanding of the instructions.   The patient was advised to call back or seek an in-person evaluation if the symptoms worsen or if the condition fails to improve as anticipated.  I provided 30 minutes of non-face-to-face time during this encounter.   Molli Barrows, MD

## 2020-03-11 ENCOUNTER — Encounter
Admission: RE | Disposition: A | Discharge status: Home / Self Care | Payer: Self-pay | Source: Home / Self Care | Attending: Vascular Surgery

## 2020-03-11 ENCOUNTER — Ambulatory Visit
Admission: RE | Admit: 2020-03-11 | Discharge: 2020-03-11 | Disposition: A | Discharge status: Home / Self Care | Payer: Medicare HMO | Attending: Vascular Surgery | Admitting: Vascular Surgery

## 2020-03-11 ENCOUNTER — Encounter: Payer: Self-pay | Admitting: Anesthesiology

## 2020-03-11 ENCOUNTER — Encounter: Payer: Self-pay | Admitting: Vascular Surgery

## 2020-03-11 ENCOUNTER — Other Ambulatory Visit: Payer: Self-pay

## 2020-03-11 DIAGNOSIS — N186 End stage renal disease: Secondary | ICD-10-CM | POA: Diagnosis not present

## 2020-03-11 DIAGNOSIS — E875 Hyperkalemia: Secondary | ICD-10-CM | POA: Insufficient documentation

## 2020-03-11 DIAGNOSIS — Z538 Procedure and treatment not carried out for other reasons: Secondary | ICD-10-CM | POA: Diagnosis not present

## 2020-03-11 LAB — POCT I-STAT, CHEM 8
BUN: 42 mg/dL — ABNORMAL HIGH (ref 6–20)
Calcium, Ion: 0.92 mmol/L — ABNORMAL LOW (ref 1.15–1.40)
Chloride: 106 mmol/L (ref 98–111)
Creatinine, Ser: 14.3 mg/dL — ABNORMAL HIGH (ref 0.61–1.24)
Glucose, Bld: 89 mg/dL (ref 70–99)
HCT: 33 % — ABNORMAL LOW (ref 39.0–52.0)
Hemoglobin: 11.2 g/dL — ABNORMAL LOW (ref 13.0–17.0)
Potassium: 6 mmol/L — ABNORMAL HIGH (ref 3.5–5.1)
Sodium: 139 mmol/L (ref 135–145)
TCO2: 21 mmol/L — ABNORMAL LOW (ref 22–32)

## 2020-03-11 LAB — POTASSIUM: Potassium: 7 mmol/L (ref 3.5–5.1)

## 2020-03-11 SURGERY — INSERTION OF ARTERIOVENOUS (AV) GORE-TEX GRAFT ARM
Anesthesia: General | Laterality: Right

## 2020-03-11 MED ORDER — CHLORHEXIDINE GLUCONATE CLOTH 2 % EX PADS: 6.0000 | MEDICATED_PAD | Freq: Once | CUTANEOUS | Status: DC

## 2020-03-11 SURGICAL SUPPLY — 52 items
BAG DECANTER FOR FLEXI CONT (MISCELLANEOUS) ×3 IMPLANT
BLADE SURG SZ11 CARB STEEL (BLADE) ×3 IMPLANT
BOOT SUTURE AID YELLOW STND (SUTURE) ×3 IMPLANT
BRUSH SCRUB EZ  4% CHG (MISCELLANEOUS) ×2
BRUSH SCRUB EZ 4% CHG (MISCELLANEOUS) ×1 IMPLANT
CANISTER SUCT 1200ML W/VALVE (MISCELLANEOUS) ×3 IMPLANT
CHLORAPREP W/TINT 26 (MISCELLANEOUS) ×3 IMPLANT
CLIP SPRNG 6MM S-JAW DBL (CLIP) ×3
COVER WAND RF STERILE (DRAPES) ×3 IMPLANT
DECANTER SPIKE VIAL GLASS SM (MISCELLANEOUS) ×3 IMPLANT
DERMABOND ADVANCED (GAUZE/BANDAGES/DRESSINGS) ×2
DERMABOND ADVANCED .7 DNX12 (GAUZE/BANDAGES/DRESSINGS) ×1 IMPLANT
ELECT CAUTERY BLADE 6.4 (BLADE) ×3 IMPLANT
ELECT REM PT RETURN 9FT ADLT (ELECTROSURGICAL) ×3
ELECTRODE REM PT RTRN 9FT ADLT (ELECTROSURGICAL) ×1 IMPLANT
GLOVE BIO SURGEON STRL SZ7 (GLOVE) ×6 IMPLANT
GLOVE INDICATOR 7.5 STRL GRN (GLOVE) ×3 IMPLANT
GOWN STRL REUS W/ TWL LRG LVL3 (GOWN DISPOSABLE) ×1 IMPLANT
GOWN STRL REUS W/ TWL XL LVL3 (GOWN DISPOSABLE) ×2 IMPLANT
GOWN STRL REUS W/TWL LRG LVL3 (GOWN DISPOSABLE) ×2
GOWN STRL REUS W/TWL XL LVL3 (GOWN DISPOSABLE) ×4
HEMOSTAT SURGICEL 2X3 (HEMOSTASIS) ×3 IMPLANT
IV NS 500ML (IV SOLUTION) ×2
IV NS 500ML BAXH (IV SOLUTION) ×1 IMPLANT
KIT TURNOVER KIT A (KITS) ×3 IMPLANT
LABEL OR SOLS (LABEL) ×3 IMPLANT
LOOP RED MAXI  1X406MM (MISCELLANEOUS) ×2
LOOP VESSEL MAXI 1X406 RED (MISCELLANEOUS) ×1 IMPLANT
LOOP VESSEL MINI 0.8X406 BLUE (MISCELLANEOUS) ×1 IMPLANT
LOOPS BLUE MINI 0.8X406MM (MISCELLANEOUS) ×2
MANIFOLD NEPTUNE II (INSTRUMENTS) ×3 IMPLANT
NEEDLE FILTER BLUNT 18X 1/2SAF (NEEDLE) ×2
NEEDLE FILTER BLUNT 18X1 1/2 (NEEDLE) ×1 IMPLANT
NS IRRIG 500ML POUR BTL (IV SOLUTION) ×3 IMPLANT
PACK EXTREMITY (MISCELLANEOUS) ×3 IMPLANT
PAD PREP 24X41 OB/GYN DISP (PERSONAL CARE ITEMS) ×3 IMPLANT
SOLUTION CELL SAVER (CLIP) ×1 IMPLANT
STOCKINETTE STRL 4IN 9604848 (GAUZE/BANDAGES/DRESSINGS) ×3 IMPLANT
SUT GORETEX CV-6TTC-13 36IN (SUTURE) ×6 IMPLANT
SUT MNCRL AB 4-0 PS2 18 (SUTURE) ×3 IMPLANT
SUT PROLENE 6 0 BV (SUTURE) ×12 IMPLANT
SUT SILK 0 SH 30 (SUTURE) ×3 IMPLANT
SUT SILK 2 0 (SUTURE) ×2
SUT SILK 2-0 18XBRD TIE 12 (SUTURE) ×1 IMPLANT
SUT SILK 3 0 (SUTURE) ×2
SUT SILK 3-0 18XBRD TIE 12 (SUTURE) ×1 IMPLANT
SUT SILK 4 0 (SUTURE) ×2
SUT SILK 4-0 18XBRD TIE 12 (SUTURE) ×1 IMPLANT
SUT VIC AB 3-0 SH 27 (SUTURE) ×2
SUT VIC AB 3-0 SH 27X BRD (SUTURE) ×1 IMPLANT
SYR 20ML LL LF (SYRINGE) ×3 IMPLANT
SYR 3ML LL SCALE MARK (SYRINGE) ×3 IMPLANT

## 2020-03-11 NOTE — Progress Notes (Addendum)
PT does not make urine, unable to obtain UDS. Dr. Ronelle Nigh aware. PT I-stat K was 6. Per Dr. Ronelle Nigh and Dr. Lucky Cowboy lab to come draw K. See orders. Pt states he was supposed to be dialyzed today   Lab called to bedside to collect K

## 2020-03-11 NOTE — Progress Notes (Signed)
LAB called K of 7. Dr. Ronelle Nigh made aware

## 2020-03-11 NOTE — Progress Notes (Signed)
Dr. Lucky Cowboy and Dr. Ronelle Nigh aware that patient refused to stay any longer to discuss options for dialysis and treatment of potassium of 7.

## 2020-03-11 NOTE — Progress Notes (Addendum)
Pt in room demanding IV be taken out and he is leaving. Angry because his potassium is high and unable to have surgery. Lissa charge RN aware.  DR Ronelle Nigh just left room from speaking with pt.  Ambulatory out of same day surgery.  Alert and oriented.  Primary RN [place] aware.

## 2020-03-13 DIAGNOSIS — N2581 Secondary hyperparathyroidism of renal origin: Secondary | ICD-10-CM | POA: Diagnosis not present

## 2020-03-13 DIAGNOSIS — Z992 Dependence on renal dialysis: Secondary | ICD-10-CM | POA: Diagnosis not present

## 2020-03-13 DIAGNOSIS — N186 End stage renal disease: Secondary | ICD-10-CM | POA: Diagnosis not present

## 2020-03-17 DIAGNOSIS — Z992 Dependence on renal dialysis: Secondary | ICD-10-CM | POA: Diagnosis not present

## 2020-03-17 DIAGNOSIS — N186 End stage renal disease: Secondary | ICD-10-CM | POA: Diagnosis not present

## 2020-03-17 DIAGNOSIS — N2581 Secondary hyperparathyroidism of renal origin: Secondary | ICD-10-CM | POA: Diagnosis not present

## 2020-03-20 DIAGNOSIS — N2581 Secondary hyperparathyroidism of renal origin: Secondary | ICD-10-CM | POA: Diagnosis not present

## 2020-03-20 DIAGNOSIS — N186 End stage renal disease: Secondary | ICD-10-CM | POA: Diagnosis not present

## 2020-03-20 DIAGNOSIS — Z992 Dependence on renal dialysis: Secondary | ICD-10-CM | POA: Diagnosis not present

## 2020-03-23 DIAGNOSIS — N051 Unspecified nephritic syndrome with focal and segmental glomerular lesions: Secondary | ICD-10-CM | POA: Diagnosis not present

## 2020-03-23 DIAGNOSIS — Z992 Dependence on renal dialysis: Secondary | ICD-10-CM | POA: Diagnosis not present

## 2020-03-23 DIAGNOSIS — N2581 Secondary hyperparathyroidism of renal origin: Secondary | ICD-10-CM | POA: Diagnosis not present

## 2020-03-23 DIAGNOSIS — N186 End stage renal disease: Secondary | ICD-10-CM | POA: Diagnosis not present

## 2020-03-25 DIAGNOSIS — L97513 Non-pressure chronic ulcer of other part of right foot with necrosis of muscle: Secondary | ICD-10-CM | POA: Diagnosis not present

## 2020-03-25 DIAGNOSIS — L97514 Non-pressure chronic ulcer of other part of right foot with necrosis of bone: Secondary | ICD-10-CM | POA: Diagnosis not present

## 2020-03-25 DIAGNOSIS — N186 End stage renal disease: Secondary | ICD-10-CM | POA: Diagnosis not present

## 2020-03-25 DIAGNOSIS — N2581 Secondary hyperparathyroidism of renal origin: Secondary | ICD-10-CM | POA: Diagnosis not present

## 2020-03-25 DIAGNOSIS — Z992 Dependence on renal dialysis: Secondary | ICD-10-CM | POA: Diagnosis not present

## 2020-03-27 DIAGNOSIS — N186 End stage renal disease: Secondary | ICD-10-CM | POA: Diagnosis not present

## 2020-03-27 DIAGNOSIS — N2581 Secondary hyperparathyroidism of renal origin: Secondary | ICD-10-CM | POA: Diagnosis not present

## 2020-03-27 DIAGNOSIS — Z992 Dependence on renal dialysis: Secondary | ICD-10-CM | POA: Diagnosis not present

## 2020-03-29 ENCOUNTER — Telehealth (INDEPENDENT_AMBULATORY_CARE_PROVIDER_SITE_OTHER): Payer: Self-pay

## 2020-03-29 NOTE — Telephone Encounter (Signed)
Patient left a message over the weekend and I attempted to return the call. The message was regarding getting rescheduled for surgery. The patient returned my call and has  now been rescheduled for 04/07/20 for his surgery with Dr. Lucky Cowboy.  Patient is to call same day the day before at [phone]  Between 1-3 to receive his arrival time to the MM. This information will be mailed.

## 2020-03-30 DIAGNOSIS — Z992 Dependence on renal dialysis: Secondary | ICD-10-CM | POA: Diagnosis not present

## 2020-03-30 DIAGNOSIS — N186 End stage renal disease: Secondary | ICD-10-CM | POA: Diagnosis not present

## 2020-03-30 DIAGNOSIS — N2581 Secondary hyperparathyroidism of renal origin: Secondary | ICD-10-CM | POA: Diagnosis not present

## 2020-03-31 ENCOUNTER — Other Ambulatory Visit (INDEPENDENT_AMBULATORY_CARE_PROVIDER_SITE_OTHER): Payer: Self-pay | Admitting: Nurse Practitioner

## 2020-04-01 DIAGNOSIS — N2581 Secondary hyperparathyroidism of renal origin: Secondary | ICD-10-CM | POA: Diagnosis not present

## 2020-04-01 DIAGNOSIS — Z992 Dependence on renal dialysis: Secondary | ICD-10-CM | POA: Diagnosis not present

## 2020-04-01 DIAGNOSIS — N186 End stage renal disease: Secondary | ICD-10-CM | POA: Diagnosis not present

## 2020-04-03 DIAGNOSIS — N186 End stage renal disease: Secondary | ICD-10-CM | POA: Diagnosis not present

## 2020-04-03 DIAGNOSIS — N2581 Secondary hyperparathyroidism of renal origin: Secondary | ICD-10-CM | POA: Diagnosis not present

## 2020-04-03 DIAGNOSIS — Z992 Dependence on renal dialysis: Secondary | ICD-10-CM | POA: Diagnosis not present

## 2020-04-06 DIAGNOSIS — N2581 Secondary hyperparathyroidism of renal origin: Secondary | ICD-10-CM | POA: Diagnosis not present

## 2020-04-06 DIAGNOSIS — Z992 Dependence on renal dialysis: Secondary | ICD-10-CM | POA: Diagnosis not present

## 2020-04-06 DIAGNOSIS — N186 End stage renal disease: Secondary | ICD-10-CM | POA: Diagnosis not present

## 2020-04-08 ENCOUNTER — Encounter
Admission: RE | Disposition: A | Discharge status: Home / Self Care | Payer: Self-pay | Source: Home / Self Care | Attending: Vascular Surgery

## 2020-04-08 ENCOUNTER — Ambulatory Visit: Payer: Medicare HMO

## 2020-04-08 ENCOUNTER — Ambulatory Visit
Admission: RE | Admit: 2020-04-08 | Discharge: 2020-04-08 | Disposition: A | Discharge status: Home / Self Care | Payer: Medicare HMO | Attending: Vascular Surgery | Admitting: Vascular Surgery

## 2020-04-08 ENCOUNTER — Other Ambulatory Visit: Payer: Self-pay

## 2020-04-08 ENCOUNTER — Ambulatory Visit: Payer: Medicare HMO | Admitting: Certified Registered"

## 2020-04-08 ENCOUNTER — Encounter: Payer: Self-pay | Admitting: Vascular Surgery

## 2020-04-08 DIAGNOSIS — I509 Heart failure, unspecified: Secondary | ICD-10-CM | POA: Diagnosis not present

## 2020-04-08 DIAGNOSIS — I120 Hypertensive chronic kidney disease with stage 5 chronic kidney disease or end stage renal disease: Secondary | ICD-10-CM | POA: Insufficient documentation

## 2020-04-08 DIAGNOSIS — N185 Chronic kidney disease, stage 5: Secondary | ICD-10-CM | POA: Diagnosis not present

## 2020-04-08 DIAGNOSIS — G8918 Other acute postprocedural pain: Secondary | ICD-10-CM | POA: Diagnosis not present

## 2020-04-08 DIAGNOSIS — Z89431 Acquired absence of right foot: Secondary | ICD-10-CM | POA: Diagnosis not present

## 2020-04-08 DIAGNOSIS — Z8616 Personal history of COVID-19: Secondary | ICD-10-CM | POA: Diagnosis not present

## 2020-04-08 DIAGNOSIS — Z89421 Acquired absence of other right toe(s): Secondary | ICD-10-CM | POA: Insufficient documentation

## 2020-04-08 DIAGNOSIS — I132 Hypertensive heart and chronic kidney disease with heart failure and with stage 5 chronic kidney disease, or end stage renal disease: Secondary | ICD-10-CM | POA: Diagnosis not present

## 2020-04-08 DIAGNOSIS — M25512 Pain in left shoulder: Secondary | ICD-10-CM

## 2020-04-08 DIAGNOSIS — Z992 Dependence on renal dialysis: Secondary | ICD-10-CM | POA: Diagnosis not present

## 2020-04-08 DIAGNOSIS — Z89411 Acquired absence of right great toe: Secondary | ICD-10-CM | POA: Insufficient documentation

## 2020-04-08 DIAGNOSIS — E1122 Type 2 diabetes mellitus with diabetic chronic kidney disease: Secondary | ICD-10-CM | POA: Diagnosis not present

## 2020-04-08 DIAGNOSIS — M79601 Pain in right arm: Secondary | ICD-10-CM | POA: Diagnosis not present

## 2020-04-08 DIAGNOSIS — N186 End stage renal disease: Secondary | ICD-10-CM | POA: Diagnosis not present

## 2020-04-08 DIAGNOSIS — F418 Other specified anxiety disorders: Secondary | ICD-10-CM | POA: Diagnosis not present

## 2020-04-08 DIAGNOSIS — L97512 Non-pressure chronic ulcer of other part of right foot with fat layer exposed: Secondary | ICD-10-CM | POA: Insufficient documentation

## 2020-04-08 DIAGNOSIS — N2581 Secondary hyperparathyroidism of renal origin: Secondary | ICD-10-CM | POA: Diagnosis not present

## 2020-04-08 HISTORY — PX: AV FISTULA PLACEMENT: SHX1204

## 2020-04-08 LAB — CBC WITH DIFFERENTIAL/PLATELET
Abs Immature Granulocytes: 0.03 10*3/uL (ref 0.00–0.07)
Basophils Absolute: 0 10*3/uL (ref 0.0–0.1)
Basophils Relative: 1 %
Eosinophils Absolute: 0.1 10*3/uL (ref 0.0–0.5)
Eosinophils Relative: 2 %
HCT: 38.9 % — ABNORMAL LOW (ref 39.0–52.0)
Hemoglobin: 12.6 g/dL — ABNORMAL LOW (ref 13.0–17.0)
Immature Granulocytes: 1 %
Lymphocytes Relative: 23 %
Lymphs Abs: 1.5 10*3/uL (ref 0.7–4.0)
MCH: 28.1 pg (ref 26.0–34.0)
MCHC: 32.4 g/dL (ref 30.0–36.0)
MCV: 86.6 fL (ref 80.0–100.0)
Monocytes Absolute: 0.5 10*3/uL (ref 0.1–1.0)
Monocytes Relative: 8 %
Neutro Abs: 4.3 10*3/uL (ref 1.7–7.7)
Neutrophils Relative %: 65 %
Platelets: 131 10*3/uL — ABNORMAL LOW (ref 150–400)
RBC: 4.49 MIL/uL (ref 4.22–5.81)
RDW: 17.2 % — ABNORMAL HIGH (ref 11.5–15.5)
WBC: 6.5 10*3/uL (ref 4.0–10.5)
nRBC: 0 % (ref 0.0–0.2)

## 2020-04-08 LAB — PROTIME-INR
INR: 1 (ref 0.8–1.2)
Prothrombin Time: 12.8 seconds (ref 11.4–15.2)

## 2020-04-08 LAB — POCT I-STAT, CHEM 8
BUN: 28 mg/dL — ABNORMAL HIGH (ref 6–20)
Calcium, Ion: 1.14 mmol/L — ABNORMAL LOW (ref 1.15–1.40)
Chloride: 102 mmol/L (ref 98–111)
Creatinine, Ser: 11 mg/dL — ABNORMAL HIGH (ref 0.61–1.24)
Glucose, Bld: 83 mg/dL (ref 70–99)
HCT: 40 % (ref 39.0–52.0)
Hemoglobin: 13.6 g/dL (ref 13.0–17.0)
Potassium: 4.1 mmol/L (ref 3.5–5.1)
Sodium: 137 mmol/L (ref 135–145)
TCO2: 24 mmol/L (ref 22–32)

## 2020-04-08 LAB — APTT: aPTT: 38 seconds — ABNORMAL HIGH (ref 24–36)

## 2020-04-08 SURGERY — INSERTION OF ARTERIOVENOUS (AV) GORE-TEX GRAFT ARM
Anesthesia: General | Site: Arm Upper | Laterality: Right

## 2020-04-08 MED ORDER — FENTANYL CITRATE (PF) 100 MCG/2ML IJ SOLN
INTRAMUSCULAR | Status: DC | PRN
  Administered 2020-04-08: 12.5 ug via INTRAVENOUS
  Administered 2020-04-08: 25 ug via INTRAVENOUS
  Administered 2020-04-08: 12.5 ug via INTRAVENOUS

## 2020-04-08 MED ORDER — CEFAZOLIN SODIUM-DEXTROSE 1-4 GM/50ML-% IV SOLN
INTRAVENOUS | Status: AC
  Filled 2020-04-08: qty 50

## 2020-04-08 MED ORDER — PROPOFOL 10 MG/ML IV BOLUS: INTRAVENOUS | Status: DC | PRN

## 2020-04-08 MED ORDER — FIBRIN SEALANT 2 ML SINGLE DOSE KIT
PACK | CUTANEOUS | Status: DC | PRN
  Administered 2020-04-08: 2 mL via TOPICAL

## 2020-04-08 MED ORDER — FAMOTIDINE 20 MG PO TABS
ORAL_TABLET | ORAL | Status: AC
  Administered 2020-04-08: 12:00:00 20 mg via ORAL
  Filled 2020-04-08: qty 1

## 2020-04-08 MED ORDER — ACETAMINOPHEN 160 MG/5ML PO SOLN
325.0000 mg | ORAL | Status: DC | PRN
  Filled 2020-04-08: qty 20.3

## 2020-04-08 MED ORDER — DEXAMETHASONE SODIUM PHOSPHATE 10 MG/ML IJ SOLN
INTRAMUSCULAR | Status: AC
  Filled 2020-04-08: qty 1

## 2020-04-08 MED ORDER — PROPOFOL 500 MG/50ML IV EMUL
INTRAVENOUS | Status: AC
  Filled 2020-04-08: qty 50

## 2020-04-08 MED ORDER — ONDANSETRON HCL 4 MG/2ML IJ SOLN
INTRAMUSCULAR | Status: AC
  Filled 2020-04-08: qty 2

## 2020-04-08 MED ORDER — CHLORHEXIDINE GLUCONATE CLOTH 2 % EX PADS: 6.0000 | MEDICATED_PAD | Freq: Once | CUTANEOUS | Status: DC

## 2020-04-08 MED ORDER — PROMETHAZINE HCL 25 MG/ML IJ SOLN: 6.2500 mg | INTRAMUSCULAR | Status: DC | PRN

## 2020-04-08 MED ORDER — KETOROLAC TROMETHAMINE 30 MG/ML IJ SOLN
INTRAMUSCULAR | Status: AC
  Filled 2020-04-08: qty 3

## 2020-04-08 MED ORDER — PHENYLEPHRINE HCL (PRESSORS) 10 MG/ML IV SOLN
INTRAVENOUS | Status: AC
  Filled 2020-04-08: qty 1

## 2020-04-08 MED ORDER — FAMOTIDINE 20 MG PO TABS: 20.0000 mg | ORAL_TABLET | Freq: Once | ORAL | Status: AC

## 2020-04-08 MED ORDER — PROPOFOL 10 MG/ML IV BOLUS
INTRAVENOUS | Status: DC | PRN
  Administered 2020-04-08: 70 mg via INTRAVENOUS
  Administered 2020-04-08 (×2): 30 mg via INTRAVENOUS

## 2020-04-08 MED ORDER — ACETAMINOPHEN 325 MG PO TABS: 325.0000 mg | ORAL_TABLET | ORAL | Status: DC | PRN

## 2020-04-08 MED ORDER — MIDAZOLAM HCL 2 MG/2ML IJ SOLN
1.0000 mg | Freq: Once | INTRAMUSCULAR | Status: AC
  Administered 2020-04-08: 12:00:00 1 mg via INTRAVENOUS

## 2020-04-08 MED ORDER — DIPHENHYDRAMINE HCL 50 MG/ML IJ SOLN
INTRAMUSCULAR | Status: AC
  Administered 2020-04-08: 13:00:00 6.5 mg via INTRAVENOUS
  Filled 2020-04-08: qty 1

## 2020-04-08 MED ORDER — PROPOFOL 500 MG/50ML IV EMUL
INTRAVENOUS | Status: DC | PRN
  Administered 2020-04-08: 80 ug/kg/min via INTRAVENOUS

## 2020-04-08 MED ORDER — MIDAZOLAM HCL 2 MG/2ML IJ SOLN
INTRAMUSCULAR | Status: AC
  Administered 2020-04-08: 12:00:00 1 mg via INTRAVENOUS
  Filled 2020-04-08: qty 2

## 2020-04-08 MED ORDER — FENTANYL CITRATE (PF) 100 MCG/2ML IJ SOLN
INTRAMUSCULAR | Status: AC
  Administered 2020-04-08: 12:00:00 50 ug via INTRAVENOUS
  Filled 2020-04-08: qty 2

## 2020-04-08 MED ORDER — FENTANYL CITRATE (PF) 100 MCG/2ML IJ SOLN
INTRAMUSCULAR | Status: AC
  Filled 2020-04-08: qty 2

## 2020-04-08 MED ORDER — SODIUM CHLORIDE 0.9 % IV SOLN: INTRAVENOUS | Status: DC

## 2020-04-08 MED ORDER — SODIUM CHLORIDE 0.9 % IV SOLN
INTRAVENOUS | Status: DC | PRN
  Administered 2020-04-08: 15:00:00 20 mL via INTRAMUSCULAR

## 2020-04-08 MED ORDER — DIPHENHYDRAMINE HCL 50 MG/ML IJ SOLN: 6.2500 mg | Freq: Once | INTRAMUSCULAR | Status: AC

## 2020-04-08 MED ORDER — FENTANYL CITRATE (PF) 100 MCG/2ML IJ SOLN: 12.5000 ug | Freq: Once | INTRAMUSCULAR | Status: DC | PRN

## 2020-04-08 MED ORDER — ESMOLOL HCL 100 MG/10ML IV SOLN
INTRAVENOUS | Status: AC
  Filled 2020-04-08: qty 10

## 2020-04-08 MED ORDER — HEPARIN SODIUM (PORCINE) 1000 UNIT/ML IJ SOLN
INTRAMUSCULAR | Status: DC | PRN
  Administered 2020-04-08: 4000 [IU] via INTRAVENOUS

## 2020-04-08 MED ORDER — ROPIVACAINE HCL 5 MG/ML IJ SOLN
INTRAMUSCULAR | Status: DC | PRN
  Administered 2020-04-08: 25 mL via PERINEURAL

## 2020-04-08 MED ORDER — HEPARIN SODIUM (PORCINE) 1000 UNIT/ML IJ SOLN
INTRAMUSCULAR | Status: AC
  Filled 2020-04-08: qty 2

## 2020-04-08 MED ORDER — PHENYLEPHRINE HCL (PRESSORS) 10 MG/ML IV SOLN
INTRAVENOUS | Status: DC | PRN
  Administered 2020-04-08 (×2): 100 ug via INTRAVENOUS

## 2020-04-08 MED ORDER — PROMETHAZINE HCL 25 MG/ML IJ SOLN: 12.5000 mg | Freq: Four times a day (QID) | INTRAMUSCULAR | Status: DC | PRN

## 2020-04-08 MED ORDER — LIDOCAINE HCL (PF) 2 % IJ SOLN
INTRAMUSCULAR | Status: AC
  Filled 2020-04-08: qty 5

## 2020-04-08 MED ORDER — CEFAZOLIN SODIUM-DEXTROSE 1-4 GM/50ML-% IV SOLN
1.0000 g | INTRAVENOUS | Status: AC
  Administered 2020-04-08: 13:00:00 2 g via INTRAVENOUS

## 2020-04-08 MED ORDER — CHLORHEXIDINE GLUCONATE 0.12 % MT SOLN: 15.0000 mL | Freq: Once | OROMUCOSAL | Status: AC

## 2020-04-08 MED ORDER — FENTANYL CITRATE (PF) 100 MCG/2ML IJ SOLN
50.0000 ug | Freq: Once | INTRAMUSCULAR | Status: AC
  Administered 2020-04-08: 12:00:00 50 ug via INTRAVENOUS

## 2020-04-08 MED ORDER — OXYCODONE-ACETAMINOPHEN 10-325 MG PO TABS: 1.0000 | ORAL_TABLET | ORAL | 0 refills | Status: DC | PRN

## 2020-04-08 MED ORDER — EPINEPHRINE PF 1 MG/ML IJ SOLN
INTRAMUSCULAR | Status: AC
  Filled 2020-04-08: qty 1

## 2020-04-08 MED ORDER — ROPIVACAINE HCL 5 MG/ML IJ SOLN
INTRAMUSCULAR | Status: AC
  Filled 2020-04-08: qty 30

## 2020-04-08 MED ORDER — ORAL CARE MOUTH RINSE: 15.0000 mL | Freq: Once | OROMUCOSAL | Status: AC

## 2020-04-08 MED ORDER — SODIUM CHLORIDE 0.9 % IV SOLN
INTRAVENOUS | Status: DC | PRN
  Administered 2020-04-08: 14:00:00 30 ug/min via INTRAVENOUS

## 2020-04-08 MED ORDER — CHLORHEXIDINE GLUCONATE 0.12 % MT SOLN
OROMUCOSAL | Status: AC
  Administered 2020-04-08: 12:00:00 15 mL via OROMUCOSAL
  Filled 2020-04-08: qty 15

## 2020-04-08 SURGICAL SUPPLY — 57 items
ADH SKN CLS APL DERMABOND .7 (GAUZE/BANDAGES/DRESSINGS) ×1
APL PRP STRL LF DISP 70% ISPRP (MISCELLANEOUS) ×1
BAG DECANTER FOR FLEXI CONT (MISCELLANEOUS) ×3 IMPLANT
BLADE SURG SZ11 CARB STEEL (BLADE) ×3 IMPLANT
BOOT SUTURE AID YELLOW STND (SUTURE) ×3 IMPLANT
BRUSH SCRUB EZ  4% CHG (MISCELLANEOUS) ×2
BRUSH SCRUB EZ 4% CHG (MISCELLANEOUS) ×1 IMPLANT
CANISTER SUCT 1200ML W/VALVE (MISCELLANEOUS) ×3 IMPLANT
CHLORAPREP W/TINT 26 (MISCELLANEOUS) ×3 IMPLANT
CLIP SPRNG 6MM S-JAW DBL (CLIP) ×3
COVER WAND RF STERILE (DRAPES) ×3 IMPLANT
DECANTER SPIKE VIAL GLASS SM (MISCELLANEOUS) ×3 IMPLANT
DERMABOND ADVANCED (GAUZE/BANDAGES/DRESSINGS) ×2
DERMABOND ADVANCED .7 DNX12 (GAUZE/BANDAGES/DRESSINGS) ×1 IMPLANT
ELECT CAUTERY BLADE 6.4 (BLADE) ×3 IMPLANT
ELECT REM PT RETURN 9FT ADLT (ELECTROSURGICAL) ×3
ELECTRODE REM PT RTRN 9FT ADLT (ELECTROSURGICAL) ×1 IMPLANT
GLOVE BIO SURGEON STRL SZ7 (GLOVE) ×6 IMPLANT
GLOVE INDICATOR 7.5 STRL GRN (GLOVE) ×3 IMPLANT
GOWN STRL REUS W/ TWL LRG LVL3 (GOWN DISPOSABLE) ×2 IMPLANT
GOWN STRL REUS W/ TWL XL LVL3 (GOWN DISPOSABLE) ×1 IMPLANT
GOWN STRL REUS W/TWL LRG LVL3 (GOWN DISPOSABLE) ×6
GOWN STRL REUS W/TWL XL LVL3 (GOWN DISPOSABLE) ×3
GRAFT COLLAGEN VASCULAR 7X45 (Vascular Products) ×3 IMPLANT
HEMOSTAT SURGICEL 2X3 (HEMOSTASIS) ×3 IMPLANT
IV NS 500ML (IV SOLUTION) ×3
IV NS 500ML BAXH (IV SOLUTION) ×1 IMPLANT
KIT TURNOVER KIT A (KITS) ×3 IMPLANT
LABEL OR SOLS (LABEL) ×3 IMPLANT
LOOP RED MAXI  1X406MM (MISCELLANEOUS) ×2
LOOP VESSEL MAXI 1X406 RED (MISCELLANEOUS) ×1 IMPLANT
LOOP VESSEL MINI 0.8X406 BLUE (MISCELLANEOUS) ×1 IMPLANT
LOOPS BLUE MINI 0.8X406MM (MISCELLANEOUS) ×2
MANIFOLD NEPTUNE II (INSTRUMENTS) ×3 IMPLANT
NEEDLE FILTER BLUNT 18X 1/2SAF (NEEDLE) ×2
NEEDLE FILTER BLUNT 18X1 1/2 (NEEDLE) ×1 IMPLANT
NS IRRIG 500ML POUR BTL (IV SOLUTION) ×3 IMPLANT
PACK EXTREMITY ARMC (MISCELLANEOUS) ×3 IMPLANT
PAD PREP 24X41 OB/GYN DISP (PERSONAL CARE ITEMS) ×3 IMPLANT
SOLUTION CELL SAVER (CLIP) ×1 IMPLANT
STOCKINETTE STRL 4IN 9604848 (GAUZE/BANDAGES/DRESSINGS) ×3 IMPLANT
SUT GORETEX CV-6TTC-13 36IN (SUTURE) IMPLANT
SUT MNCRL AB 4-0 PS2 18 (SUTURE) ×3 IMPLANT
SUT PROLENE 6 0 BV (SUTURE) ×12 IMPLANT
SUT SILK 2 0 (SUTURE) ×3
SUT SILK 2 0 SH (SUTURE) ×3 IMPLANT
SUT SILK 2-0 18XBRD TIE 12 (SUTURE) ×1 IMPLANT
SUT SILK 3 0 (SUTURE) ×3
SUT SILK 3-0 18XBRD TIE 12 (SUTURE) ×1 IMPLANT
SUT SILK 4 0 (SUTURE) ×3
SUT SILK 4-0 18XBRD TIE 12 (SUTURE) ×1 IMPLANT
SUT VIC AB 3-0 SH 27 (SUTURE) ×3
SUT VIC AB 3-0 SH 27X BRD (SUTURE) ×1 IMPLANT
SYR 20ML LL LF (SYRINGE) ×3 IMPLANT
SYR 3ML LL SCALE MARK (SYRINGE) ×3 IMPLANT
SYR TOOMEY IRRIG 70ML (MISCELLANEOUS) ×3
SYRINGE TOOMEY IRRIG 70ML (MISCELLANEOUS) ×1 IMPLANT

## 2020-04-08 NOTE — Transfer of Care (Signed)
Immediate Anesthesia Transfer of Care Note  Patient: Patrick Downs  Procedure(s) Performed: INSERTION OF ARTERIOVENOUS (AV) GORE-TEX GRAFT ARM ( ARTEGRAFT) (Right Arm Upper)  Patient Location: PACU  Anesthesia Type:General  Level of Consciousness: sedated  Airway & Oxygen Therapy: Patient Spontanous Breathing and Patient connected to face mask oxygen  Post-op Assessment: Report given to RN and Post -op Vital signs reviewed and stable  Post vital signs: Reviewed and stable  Last Vitals:  Vitals Value Taken Time  BP 148/107 04/08/20 1500  Temp    Pulse 74 04/08/20 1500  Resp 19 04/08/20 1502  SpO2 100 % 04/08/20 1500  Vitals shown include unvalidated device data.  Last Pain:  Vitals:   04/08/20 1500  TempSrc:   PainSc: Asleep         Complications: No complications documented.

## 2020-04-08 NOTE — Anesthesia Procedure Notes (Addendum)
Anesthesia Regional Block: Supraclavicular block   Pre-Anesthetic Checklist: ,, timeout performed, Correct Patient, Correct Site, Correct Laterality, Correct Procedure, Correct Position, site marked, Risks and benefits discussed,  Surgical consent,  Pre-op evaluation,  At surgeon's request and post-op pain management  Laterality: Upper and Right  Prep: chloraprep       Needles:  Injection technique: Single-shot  Needle Type: Echogenic Stimulator Needle     Needle Length: 10cm  Needle Gauge: 21   Needle insertion depth: 8 cm   Additional Needles:   Procedures: Doppler guided,,,, ultrasound used (permanent image in chart),,,,  Motor weakness within 8 minutes.  Narrative:  Start time: 04/08/2020 12:08 PM End time: 04/08/2020 12:13 PM Injection made incrementally with aspirations every 5 mL.  Performed by: Personally  Anesthesiologist: Alphonsus Sias, MD  Additional Notes: Functioning IV was confirmed and O2 [place]/monitors were applied. Light sedation administered as required, patient responsive throughout. A 157mm 21ga EchoStim needle was used. Sterile prep and drape,hand hygiene and sterile gloves were used.  Negative aspiration and negative test dose prior to incremental administration of local anesthetic. 1% Lidocaine for skin wheal, 5 ml. Total LA: 60ml - 0.5% Ropivicaine/1:200 epi. U/S images stored in chart. The patient tolerated the procedure well.

## 2020-04-08 NOTE — H&P (Signed)
[facility] Admission History & Physical  MRN : [id number]  Patrick Downs is a 44 y.o. ([birth year]) male who presents with chief complaint of No chief complaint on file. Marland Kitchen  History of Present Illness: Patient presents today for his dialysis access placement.  Was on the schedule for last month but had to be rescheduled due to hyperkalemia.  No new complaints today.  Still using his PermCath which she has had in for a year or so now.  No fevers or chills.  No chest pain or shortness of breath  Current Facility-Administered Medications  Medication Dose Route Frequency Provider Last Rate Last Admin  . 0.9 %  sodium chloride infusion   Intravenous Continuous Arita Miss, MD      . ceFAZolin (ANCEF) IVPB 1 g/50 mL premix  1 g Intravenous On Call to OR Kris Hartmann, NP      . chlorhexidine (PERIDEX) 0.12 % solution 15 mL  15 mL Mouth/Throat Once Arita Miss, MD       Or  . MEDLINE mouth rinse  15 mL Mouth Rinse Once Arita Miss, MD      . Chlorhexidine Gluconate Cloth 2 % PADS 6 each  6 each Topical Once Kris Hartmann, NP       And  . Chlorhexidine Gluconate Cloth 2 % PADS 6 each  6 each Topical Once Kris Hartmann, NP      . famotidine (PEPCID) tablet 20 mg  20 mg Oral Once Karen Kitchens, NP      . fentaNYL (SUBLIMAZE) injection 12.5 mcg  12.5 mcg Intravenous Once PRN Kris Hartmann, NP      . promethazine (PHENERGAN) injection 12.5 mg  12.5 mg Intravenous Q6H PRN Kris Hartmann, NP        Past Medical History:  Diagnosis Date  . CHF (congestive heart failure) ([place])   . Degenerative disc disease, lumbar   . Depression   . Dialysis patient ([place])   . ESRD (end stage renal disease) on dialysis ([place])   . Failure to thrive in adult   . GERD (gastroesophageal reflux disease)   . Gout   . Guillain Barr syndrome ([place])   . Guillain Barr syndrome ([place])   . Heart murmur   . HTN (hypertension)   . Hyperparathyroidism ([place])   . Kidney failure   . MRSA  (methicillin resistant staph aureus) culture positive   . Peripheral neuropathy   . Pneumonia   . Renal insufficiency   . Respiratory failure [facility])     Past Surgical History:  Procedure Laterality Date  . A/V FISTULAGRAM Right 01/07/2018   Procedure: A/V FISTULAGRAM;  Surgeon: Algernon Huxley, MD;  Location: [facility];  Service: Cardiovascular;  Laterality: Right;  . AMPUTATION TOE Right 06/08/2017   Procedure: AMPUTATION TOE RIGHT FIFTH TOE;  Surgeon: Samara Deist, DPM;  Location: [facility];  Service: Podiatry;  Laterality: Right;  . AMPUTATION TOE Left 05/17/2018   Procedure: RAY LEFT 5TH;  Surgeon: Samara Deist, DPM;  Location: [facility];  Service: Podiatry;  Laterality: Left;  . AMPUTATION TOE Right 03/05/2020   Procedure: AMPUTATION TOE IPJ X 2 RIGHT GREAT AND 3RD;  Surgeon: Samara Deist, DPM;  Location: [facility];  Service: Podiatry;  Laterality: Right;  . AV FISTULA PLACEMENT     x5      2 graphs  . DIALYSIS/PERMA CATHETER INSERTION N/A 01/20/2019   Procedure: DIALYSIS/PERMA CATHETER INSERTION;  Surgeon: Leotis Pain  S, MD;  Location: [facility];  Service: Cardiovascular;  Laterality: N/A;  . DIALYSIS/PERMA CATHETER REMOVAL N/A 06/17/2018   Procedure: DIALYSIS/PERMA CATHETER REMOVAL;  Surgeon: Algernon Huxley, MD;  Location: [place] [facility];  Service: Cardiovascular;  Laterality: N/A;  . PARATHYROIDECTOMY    . PERIPHERAL VASCULAR THROMBECTOMY Right 01/22/2019   Procedure: PERIPHERAL VASCULAR THROMBECTOMY;  Surgeon: Algernon Huxley, MD;  Location: [place] [facility];  Service: Cardiovascular;  Laterality: Right;  . RENAL BIOPSY    . REVISON OF ARTERIOVENOUS FISTULA Right 02/07/2018   Procedure: REVISON OF ARTERIOVENOUS FISTULA;  Surgeon: Algernon Huxley, MD;  Location: [facility];  Service: Vascular;  Laterality: Right;  . TEE WITHOUT CARDIOVERSION N/A 01/22/2018   Procedure: TRANSESOPHAGEAL ECHOCARDIOGRAM (TEE);  Surgeon: Dionisio David, MD;  Location: [facility];   Service: Cardiovascular;  Laterality: N/A;  . tonsiilectomy    . TONSILLECTOMY    . tracheotomy    . UPPER EXTREMITY VENOGRAPHY Bilateral 02/23/2020   Procedure: UPPER EXTREMITY VENOGRAPHY;  Surgeon: Algernon Huxley, MD;  Location: [place] [facility];  Service: Cardiovascular;  Laterality: Bilateral;     Social History   Tobacco Use  . Smoking status: Never Smoker  . Smokeless tobacco: Never Used  Vaping Use  . Vaping Use: Never used  Substance Use Topics  . Alcohol use: No  . Drug use: Yes    Types: Marijuana     Family History  Problem Relation Age of Onset  . Diabetes Mellitus II Father   . Kidney disease Father   . Kidney failure Paternal Grandfather   . Prostate cancer Neg Hx   . Kidney cancer Neg Hx   . Bladder Cancer Neg Hx     Allergies  Allergen Reactions  . Hepatitis B Vaccine     Other reaction(s): Unknown  . Ondansetron Other (See Comments)    Stomach pain   . Minoxidil Other (See Comments)    "put fluid around my heart", PERICARDIAL EFFUSION  . Morphine And Related Other (See Comments)    Aggressive   . Omnipaque [Iohexol] Itching and Other (See Comments)    Rigors on one occasion, widespread itching on a separate occasion (resolved with Benadryl), tremors    REVIEW OF SYSTEMS (Negative unless checked)  Constitutional: [] ?Weight loss  [] ?Fever  [] ?Chills Cardiac: [] ?Chest pain   [] ?Chest pressure   [] ?Palpitations   [] ?Shortness of breath when laying flat   [] ?Shortness of breath at rest   [] ?Shortness of breath with exertion. Vascular:  [] ?Pain in legs with walking   [] ?Pain in legs at rest   [] ?Pain in legs when laying flat   [] ?Claudication   [] ?Pain in feet when walking  [] ?Pain in feet at rest  [] ?Pain in feet when laying flat   [] ?History of DVT   [] ?Phlebitis   [] ?Swelling in legs   [] ?Varicose veins   [x] ?Non-healing ulcers Pulmonary:   [] ?Uses home oxygen   [] ?Productive cough   [] ?Hemoptysis   [] ?Wheeze  [] ?COPD   [] ?Asthma Neurologic:   [] ?Dizziness  [] ?Blackouts   [] ?Seizures   [] ?History of stroke   [] ?History of TIA  [] ?Aphasia   [] ?Temporary blindness   [] ?Dysphagia   [x] ?Weakness or numbness in arms   [x] ?Weakness or numbness in legs Musculoskeletal:  [x] ?Arthritis   [] ?Joint swelling   [] ?Joint pain   [] ?Low back pain Hematologic:  [] ?Easy bruising  [] ?Easy bleeding   [] ?Hypercoagulable state   [x] ?Anemic  [] ?Hepatitis Gastrointestinal:  [] ?Blood in stool   [] ?  Vomiting blood  [] ?Gastroesophageal reflux/heartburn   [] ?Abdominal pain Genitourinary:  [x] ?Chronic kidney disease   [] ?Difficult urination  [] ?Frequent urination  [] ?Burning with urination   [] ?Hematuria Skin:  [] ?Rashes   [x] ?Ulcers   [x] ?Wounds Psychological:  [] ?History of anxiety   [] ? History of major depression.  Physical Examination  Vitals:   04/08/20 1129  BP: (!) 126/97  Pulse: 86  Resp: 16  Temp: 97.6 F (36.4 C)  TempSrc: Oral  SpO2: 96%  Weight: 115.7 kg  Height: 6\' 2"  (1.88 m)   Body mass index is 32.74 kg/m. Gen: WD/WN, NAD Head: [place]/AT, No temporalis wasting.  Ear/Nose/Throat: Hearing grossly intact, nares w/o erythema or drainage, oropharynx w/o Erythema/Exudate,  Eyes: Conjunctiva clear, sclera non-icteric Neck: Trachea midline.  No JVD.  Pulmonary:  Good air movement, respirations not labored, no use of accessory muscles.  Cardiac: RRR, normal S1, S2. Vascular:  Vessel Right Left  Radial Palpable Palpable                          PT Palpable Palpable  DP Palpable Palpable   Gastrointestinal: soft, non-tender/non-distended. No guarding/reflex.  Musculoskeletal: M/S 5/5 throughout.  Extremities without ischemic changes.  No deformity or atrophy.  Neurologic: Sensation grossly intact in extremities.  Symmetrical.  Speech is fluent. Motor exam as listed above. Psychiatric: Judgment intact, Mood & affect appropriate for pt's clinical situation. Dermatologic: No rashes or ulcers noted.  No cellulitis or open  wounds.      CBC Lab Results  Component Value Date   WBC 5.2 03/03/2020   HGB 13.6 04/08/2020   HCT 40.0 04/08/2020   MCV 90.4 03/03/2020   PLT 153 03/03/2020    BMET    Component Value Date/Time   NA 137 04/08/2020 1129   NA 136 08/22/2014 0653   K 4.1 04/08/2020 1129   K 5.1 08/22/2014 0653   CL 102 04/08/2020 1129   CL 95 (L) 08/22/2014 0653   CO2 22 03/03/2020 1245   CO2 27 08/22/2014 0653   GLUCOSE 83 04/08/2020 1129   GLUCOSE 176 (H) 08/22/2014 0653   BUN 28 (H) 04/08/2020 1129   BUN 71 (H) 08/22/2014 0653   CREATININE 11.00 (H) 04/08/2020 1129   CREATININE 8.87 (H) 08/22/2014 0653   CALCIUM 7.8 (L) 03/03/2020 1245   CALCIUM 7.1 (L) 08/22/2014 0653   GFRNONAA 5 (L) 03/03/2020 1245   GFRNONAA 7 (L) 08/22/2014 0653   GFRAA 4 (L) 01/27/2020 0511   GFRAA 8 (L) 08/22/2014 1610   Estimated Creatinine Clearance: 11.7 mL/min (A) (by C-G formula based on SCr of 11 mg/dL (H)).  COAG Lab Results  Component Value Date   INR 1.1 03/03/2020   INR 1.1 01/25/2020   INR 1.2 08/09/2019    Radiology No results found.   Assessment/Plan Ulcer of great toe, right, with fat layer exposed ([place]) To assess his perfusion for wound healing noninvasive studies were performed recently.  These demonstrated normal brisk waveforms and normal digital waveforms bilaterally consistent with adequate blood flow for wound healing.  His right ABI was 1.28 and his left ABI was 1.30.  Does not appear as if he would need an angiogram or other vascular intervention at this time.  COVID-19 He developed the toe ulcerations after his COVID-19 infection a couple of months ago.  Otherwise has done well from this.  HTN (hypertension) Likely an underlying cause of his renal failure and blood pressure control important in reducing the progression  of atherosclerotic disease. On appropriate oral medications.   ESRD on dialysis [facility]) The patient has a difficult dialysis access situation.     Multiple failed access placements in the past.  Would appear that he has anatomy available for a right arm AV graft.  Has had issues with graft infections and other problems so using an Artegraft may be a better option.  Risks and benefits are discussed.  Plan to proceed today.  Surgery was scheduled for last month but had to be canceled due to hyperkalemia.    Leotis Pain, MD  04/08/2020 11:36 AM

## 2020-04-08 NOTE — Anesthesia Procedure Notes (Signed)
Procedure Name: LMA Insertion Date/Time: 04/08/2020 1:37 PM Performed by: Lily Peer, Blanca Carreon, CRNA Pre-anesthesia Checklist: Patient identified, Emergency Drugs available, Suction available and Patient being monitored Patient Re-evaluated:Patient Re-evaluated prior to induction Oxygen Delivery Method: Circle system utilized Preoxygenation: Pre-oxygenation with 100% oxygen Induction Type: IV induction LMA: LMA inserted LMA Size: 4.5 Number of attempts: 1 Placement Confirmation: positive ETCO2 and breath sounds checked- equal and bilateral Tube secured with: Tape Dental Injury: Teeth and Oropharynx as per pre-operative assessment

## 2020-04-08 NOTE — Discharge Instructions (Signed)

## 2020-04-08 NOTE — Anesthesia Preprocedure Evaluation (Addendum)
Anesthesia Evaluation  Patient identified by MRN, date of birth, ID band Patient awake  General Assessment Comment:K=4.1  Reviewed: Allergy & Precautions, H&P , NPO status , reviewed documented beta blocker date and time   Airway Mallampati: II  TM Distance: >3 FB Neck ROM: full    Dental  (+) Teeth Intact   Pulmonary shortness of breath, pneumonia,    Pulmonary exam normal        Cardiovascular hypertension, +CHF  Normal cardiovascular exam+ Valvular Problems/Murmurs   2019 ECHO Study Conclusions   - Left ventricle: The cavity size was normal. There was moderate  concentric hypertrophy. Systolic function was normal. The  estimated ejection fraction was in the range of 60% to 65%. Wall  motion was normal; there were no regional wall motion  abnormalities. Doppler parameters are consistent with abnormal  left ventricular relaxation (grade 1 diastolic dysfunction).  - Aortic valve: Severe calcification on the non coronary cusp  without stenosis. Valve area (Vmax): 2.1 cm^2.  - Left atrium: The atrium was mildly dilated.  Mild MR   Neuro/Psych  Headaches, PSYCHIATRIC DISORDERS Anxiety Depression  Neuromuscular disease    GI/Hepatic PUD, GERD  Medicated and Controlled,  Endo/Other  diabetes  Renal/GU Dialysis and ESRFRenal disease     Musculoskeletal  (+) Arthritis ,   Abdominal   Peds  Hematology  (+) Blood dyscrasia, anemia ,   Anesthesia Other Findings Past Medical History: No date: CHF (congestive heart failure) ([facility]) No date: Degenerative disc disease, lumbar No date: Depression No date: Dialysis patient ([place]) No date: ESRD (end stage renal disease) on dialysis ([place]) No date: Failure to thrive in adult No date: GERD (gastroesophageal reflux disease) No date: Gout No date: Guillain Barr syndrome ([place]) No date: Guillain Barr syndrome ([place]) No date: Heart murmur No date: HTN  (hypertension) No date: Hyperparathyroidism ([place]) No date: Kidney failure No date: MRSA (methicillin resistant staph aureus) culture positive No date: Peripheral neuropathy No date: Pneumonia No date: Renal insufficiency No date: Respiratory failure [facility])  Past Surgical History: 01/07/2018: A/V FISTULAGRAM; Right     Comment:  Procedure: A/V FISTULAGRAM;  Surgeon: Algernon Huxley, MD;               Location: [place] [facility];  Service: Cardiovascular;              Laterality: Right; 06/08/2017: AMPUTATION TOE; Right     Comment:  Procedure: AMPUTATION TOE RIGHT FIFTH TOE;  Surgeon:               Samara Deist, DPM;  Location: [facility];  Service:               Podiatry;  Laterality: Right; 05/17/2018: AMPUTATION TOE; Left     Comment:  Procedure: RAY LEFT 5TH;  Surgeon: Samara Deist, DPM;               Location: [facility];  Service: Podiatry;  Laterality:               Left; 03/05/2020: AMPUTATION TOE; Right     Comment:  Procedure: AMPUTATION TOE IPJ X 2 RIGHT GREAT AND 3RD;                Surgeon: Samara Deist, DPM;  Location: [facility];                Service: Podiatry;  Laterality: Right; No date: AV FISTULA PLACEMENT     Comment:  x5  2 graphs 01/20/2019: DIALYSIS/PERMA CATHETER INSERTION; N/A     Comment:  Procedure: DIALYSIS/PERMA CATHETER INSERTION;  Surgeon:               Algernon Huxley, MD;  Location: [place] [facility];                Service: Cardiovascular;  Laterality: N/A; 06/17/2018: DIALYSIS/PERMA CATHETER REMOVAL; N/A     Comment:  Procedure: DIALYSIS/PERMA CATHETER REMOVAL;  Surgeon:               Algernon Huxley, MD;  Location: [place] [facility];                Service: Cardiovascular;  Laterality: N/A; No date: PARATHYROIDECTOMY 01/22/2019: PERIPHERAL VASCULAR THROMBECTOMY; Right     Comment:  Procedure: PERIPHERAL VASCULAR THROMBECTOMY;  Surgeon:               Algernon Huxley, MD;  Location: [place] [facility];                Service: Cardiovascular;   Laterality: Right; No date: RENAL BIOPSY 02/07/2018: REVISON OF ARTERIOVENOUS FISTULA; Right     Comment:  Procedure: REVISON OF ARTERIOVENOUS FISTULA;  Surgeon:               Algernon Huxley, MD;  Location: [facility];  Service:               Vascular;  Laterality: Right; 01/22/2018: TEE WITHOUT CARDIOVERSION; N/A     Comment:  Procedure: TRANSESOPHAGEAL ECHOCARDIOGRAM (TEE);                Surgeon: Dionisio David, MD;  Location: [facility];                Service: Cardiovascular;  Laterality: N/A; No date: tonsiilectomy No date: TONSILLECTOMY No date: tracheotomy 02/23/2020: [place]; Bilateral     Comment:  Procedure: UPPER EXTREMITY VENOGRAPHY;  Surgeon: Algernon Huxley, MD;  Location: [place] [facility];  Service:               Cardiovascular;  Laterality: Bilateral;     Reproductive/Obstetrics                            Anesthesia Physical Anesthesia Plan  ASA: III  Anesthesia Plan: General   Post-op Pain Management: GA combined w/ Regional for post-op pain   Induction: Intravenous  PONV Risk Score and Plan: Treatment may vary due to age or medical condition and TIVA  Airway Management Planned: Nasal Cannula and Natural Airway  Additional Equipment:   Intra-op Plan:   Post-operative Plan:   Informed Consent: I have reviewed the patients History and Physical, chart, labs and discussed the procedure including the risks, benefits and alternatives for the proposed anesthesia with the patient or authorized representative who has indicated his/her understanding and acceptance.     Dental Advisory Given  Plan Discussed with: CRNA  Anesthesia Plan Comments: (Pt requests block w TIVA)       Anesthesia Quick Evaluation

## 2020-04-08 NOTE — Op Note (Signed)
OPERATIVE NOTE   PROCEDURE:  Right forearm loop arteriovenous graft with 6 mm Artegraft  PRE-OPERATIVE DIAGNOSIS: 1. ESRD 2. Multiple failed access placements  POST-OPERATIVE DIAGNOSIS: same as above  SURGEON: Leotis Pain, MD  ASSISTANT(S): Hezzie Bump, PA-C  ANESTHESIA: block and general  ESTIMATED BLOOD LOSS: 25 cc  FINDING(S): 1. none  SPECIMEN(S):  none  INDICATIONS:   Patrick Downs is a 44 y.o. male who  presents with ESRD and need for permanent dialysis access.  Risk, benefits, and alternatives to access surgery were discussed.  The difference between AV fistulas and AV grafts were discussed.  The patient is aware the risks include but are not limited to: bleeding, infection, steal syndrome, nerve damage, ischemic monomelic neuropathy, failure to mature, and need for additional procedures.  The patient is aware of the risks and elects to proceed forward. An assistant was present during the procedure to help facilitate the exposure and expedite the procedure.  DESCRIPTION: After full informed written consent was obtained from the patient, the patient was brought back to the operating room and placed supine upon the operating table.  The patientwas given IV antibiotics prior to proceeding.  After obtaining adequate general anesthesia, the patient was prepped and draped in standard fashion.  The assistant provided retraction and mobilization to help facilitate exposure and expedite the procedure throughout the entire procedure.  This included following suture, using retractors, and optimizing lighting.  I made a transverse incision at the antecubital fossa and dissected down through the subcutaneous tissue and fascia. The dissection was somewhat tedious due to multiple previous surgeries in that arm. The brachial artery was dissected proximally and distally and vessel loops placed for control.  The artery was patent and adequate sized for AV graft creation.  One of the brachial  veins was in close proximity and was found to be patent and of adequate size for AV graft creation.  I dissected it out and placed a vessel loop around the vein, and would later control this with bulldog clamps.  I then obtained a 6 mm Artegraft graft, which I stretched to full length to help determine the apex of this looped forearm arteriovenous graft.  I made a transverse incision at the determined apex site and dissected down out a pocket.  I then took a curved metal tunneler and dissected from the antecubital incision up to the apical incision.  I delivered the graft through the metal tunnel, taking care to maintain the orientation of the graft.  I then tunneled from the apex to the antecubital incision, leaving the metal tunnel in place and delivered the remainder of the graft through the tunnel.   I then gave the patient 4000 units of heparin to gain some anticoagulation, and allowed this to circulate for several minutes. I placed the brachial artery under tension proximally and distally with vessel loops.  I made an arteriotomy with a 11 blade and extended it with a Potts scissor for.  The graft was then cut and beveled to match the arteriotomy.  The graft was sewn to the artery with a running 6-0 Prolene suture, in a end-to-side configuration.  Prior to completing the anastomosis, I allowed the artery to backbleed from both ends.  I then  released the vessel loops on the inflow and allowed the artery to decompress through the graft. There was good pulsatile bleeding through this graft.  I then clamped the graft near its arterial anastomosis.  I then suctioned out all the blood  in the graft and instilled heparinized saline into the graft.   At this point, I pulled the graft to appropriate tension to remove any redundancy.  I then used the bulldog clamps to control the vein.  An anterior wall venotomy was then made with an 11 blade and extended with Potts scissors.  The graft was then cut an beveled to match  the venotomy.  The venous anastomosis was created with a 6-0 Prolene suture. Prior to completing this anastomosis, I allowed the vein to back bleed and then I also allowed the artery to bleed in an antegrade fashion.  I completed this anastomosis in the usual fashion, and irrigated out the wound with sterile saline.  I released all clamps.  There was excellent flow in the graft, and a palpable pulse in the artery beyond the graft. At this point, I washed out the antecubital wound.  I placed Surgicel and Vistacel topical hemostatic agents and hemostasis was complete. The subcutaneous tissue was reapproximated in all incisions with a running stitch of 3-0 Vicryl.  The skin was then reapproximated in all incisions with a running subcuticular 4-0 Monocryl.  The skin was then cleaned and dried at all incisions, and Dermabond used to reinforce the skin closure.  The patient was taken to the recovery room in stable condition having tolerated the procedure well.  COMPLICATIONS: none  CONDITION: stable  Leotis Pain  04/08/2020, 2:48 PM                  This note was created with Dragon Medical transcription system. Any errors in dictation are purely unintentional.

## 2020-04-09 ENCOUNTER — Encounter: Payer: Self-pay | Admitting: Vascular Surgery

## 2020-04-09 NOTE — Progress Notes (Signed)
Notified by lab of critical lab value of Calcium 4.8.  Dr. Bunnie Domino office was notified.

## 2020-04-10 LAB — TYPE AND SCREEN
ABO/RH(D): O POS
Antibody Screen: NEGATIVE

## 2020-04-13 DIAGNOSIS — N186 End stage renal disease: Secondary | ICD-10-CM | POA: Diagnosis not present

## 2020-04-13 DIAGNOSIS — N2581 Secondary hyperparathyroidism of renal origin: Secondary | ICD-10-CM | POA: Diagnosis not present

## 2020-04-13 DIAGNOSIS — Z992 Dependence on renal dialysis: Secondary | ICD-10-CM | POA: Diagnosis not present

## 2020-04-14 LAB — BASIC METABOLIC PANEL
Anion gap: 14 (ref 5–15)
BUN: 29 mg/dL — ABNORMAL HIGH (ref 6–20)
CO2: 23 mmol/L (ref 22–32)
Calcium: 9.6 mg/dL (ref 8.9–10.3)
Chloride: 100 mmol/L (ref 98–111)
Creatinine, Ser: 10.26 mg/dL — ABNORMAL HIGH (ref 0.61–1.24)
GFR, Estimated: 6 mL/min — ABNORMAL LOW (ref >60)
Glucose, Bld: 84 mg/dL (ref 70–99)
Potassium: 4.1 mmol/L (ref 3.5–5.1)
Sodium: 137 mmol/L (ref 135–145)

## 2020-04-15 DIAGNOSIS — N186 End stage renal disease: Secondary | ICD-10-CM | POA: Diagnosis not present

## 2020-04-15 DIAGNOSIS — N2581 Secondary hyperparathyroidism of renal origin: Secondary | ICD-10-CM | POA: Diagnosis not present

## 2020-04-15 DIAGNOSIS — Z992 Dependence on renal dialysis: Secondary | ICD-10-CM | POA: Diagnosis not present

## 2020-04-15 NOTE — Anesthesia Postprocedure Evaluation (Signed)
Anesthesia Post Note  Patient: Patrick Downs  Procedure(s) Performed: INSERTION OF ARTERIOVENOUS (AV) GORE-TEX GRAFT ARM ( ARTEGRAFT) (Right Arm Upper)  Patient location during evaluation: PACU Anesthesia Type: General Level of consciousness: awake and alert Pain management: pain level controlled (Good block) Vital Signs Assessment: post-procedure vital signs reviewed and stable Respiratory status: spontaneous breathing, nonlabored ventilation and respiratory function stable Cardiovascular status: blood pressure returned to baseline and stable Postop Assessment: no apparent nausea or vomiting Anesthetic complications: no   No complications documented.   Last Vitals:  Vitals:   04/08/20 1539 04/08/20 1550  BP: 105/73 110/71  Pulse: 80 80  Resp: 12 18  Temp: (!) 36.1 C (!) 36.3 C  SpO2: 94% 100%    Last Pain:  Vitals:   04/09/20 0821  TempSrc:   PainSc: 8                  Wynetta Seith Harvie Heck

## 2020-04-18 DIAGNOSIS — Z992 Dependence on renal dialysis: Secondary | ICD-10-CM | POA: Diagnosis not present

## 2020-04-18 DIAGNOSIS — N186 End stage renal disease: Secondary | ICD-10-CM | POA: Diagnosis not present

## 2020-04-18 DIAGNOSIS — N2581 Secondary hyperparathyroidism of renal origin: Secondary | ICD-10-CM | POA: Diagnosis not present

## 2020-04-20 DIAGNOSIS — Z992 Dependence on renal dialysis: Secondary | ICD-10-CM | POA: Diagnosis not present

## 2020-04-20 DIAGNOSIS — N186 End stage renal disease: Secondary | ICD-10-CM | POA: Diagnosis not present

## 2020-04-20 DIAGNOSIS — N2581 Secondary hyperparathyroidism of renal origin: Secondary | ICD-10-CM | POA: Diagnosis not present

## 2020-04-22 DIAGNOSIS — N2581 Secondary hyperparathyroidism of renal origin: Secondary | ICD-10-CM | POA: Diagnosis not present

## 2020-04-22 DIAGNOSIS — Z992 Dependence on renal dialysis: Secondary | ICD-10-CM | POA: Diagnosis not present

## 2020-04-22 DIAGNOSIS — N186 End stage renal disease: Secondary | ICD-10-CM | POA: Diagnosis not present

## 2020-04-23 DIAGNOSIS — N186 End stage renal disease: Secondary | ICD-10-CM | POA: Diagnosis not present

## 2020-04-23 DIAGNOSIS — Z992 Dependence on renal dialysis: Secondary | ICD-10-CM | POA: Diagnosis not present

## 2020-04-23 DIAGNOSIS — N051 Unspecified nephritic syndrome with focal and segmental glomerular lesions: Secondary | ICD-10-CM | POA: Diagnosis not present

## 2020-04-25 DIAGNOSIS — N186 End stage renal disease: Secondary | ICD-10-CM | POA: Diagnosis not present

## 2020-04-25 DIAGNOSIS — N2581 Secondary hyperparathyroidism of renal origin: Secondary | ICD-10-CM | POA: Diagnosis not present

## 2020-04-25 DIAGNOSIS — Z992 Dependence on renal dialysis: Secondary | ICD-10-CM | POA: Diagnosis not present

## 2020-04-26 ENCOUNTER — Other Ambulatory Visit (INDEPENDENT_AMBULATORY_CARE_PROVIDER_SITE_OTHER): Payer: Self-pay | Admitting: Vascular Surgery

## 2020-04-26 DIAGNOSIS — Z95828 Presence of other vascular implants and grafts: Secondary | ICD-10-CM

## 2020-04-26 DIAGNOSIS — N186 End stage renal disease: Secondary | ICD-10-CM

## 2020-04-27 DIAGNOSIS — N186 End stage renal disease: Secondary | ICD-10-CM | POA: Diagnosis not present

## 2020-04-27 DIAGNOSIS — Z992 Dependence on renal dialysis: Secondary | ICD-10-CM | POA: Diagnosis not present

## 2020-04-27 DIAGNOSIS — N2581 Secondary hyperparathyroidism of renal origin: Secondary | ICD-10-CM | POA: Diagnosis not present

## 2020-04-29 DIAGNOSIS — Z992 Dependence on renal dialysis: Secondary | ICD-10-CM | POA: Diagnosis not present

## 2020-04-29 DIAGNOSIS — N2581 Secondary hyperparathyroidism of renal origin: Secondary | ICD-10-CM | POA: Diagnosis not present

## 2020-04-29 DIAGNOSIS — N186 End stage renal disease: Secondary | ICD-10-CM | POA: Diagnosis not present

## 2020-04-30 ENCOUNTER — Encounter (INDEPENDENT_AMBULATORY_CARE_PROVIDER_SITE_OTHER): Payer: Medicare HMO

## 2020-04-30 ENCOUNTER — Ambulatory Visit (INDEPENDENT_AMBULATORY_CARE_PROVIDER_SITE_OTHER): Payer: Medicare HMO | Admitting: Nurse Practitioner

## 2020-05-01 DIAGNOSIS — Z992 Dependence on renal dialysis: Secondary | ICD-10-CM | POA: Diagnosis not present

## 2020-05-01 DIAGNOSIS — N186 End stage renal disease: Secondary | ICD-10-CM | POA: Diagnosis not present

## 2020-05-01 DIAGNOSIS — N2581 Secondary hyperparathyroidism of renal origin: Secondary | ICD-10-CM | POA: Diagnosis not present

## 2020-05-04 ENCOUNTER — Ambulatory Visit: Payer: Medicare HMO | Attending: Anesthesiology | Admitting: Anesthesiology

## 2020-05-04 ENCOUNTER — Other Ambulatory Visit: Payer: Self-pay

## 2020-05-04 DIAGNOSIS — N186 End stage renal disease: Secondary | ICD-10-CM | POA: Diagnosis not present

## 2020-05-04 DIAGNOSIS — R29898 Other symptoms and signs involving the musculoskeletal system: Secondary | ICD-10-CM

## 2020-05-04 DIAGNOSIS — G8929 Other chronic pain: Secondary | ICD-10-CM

## 2020-05-04 DIAGNOSIS — M47816 Spondylosis without myelopathy or radiculopathy, lumbar region: Secondary | ICD-10-CM

## 2020-05-04 DIAGNOSIS — N2581 Secondary hyperparathyroidism of renal origin: Secondary | ICD-10-CM | POA: Diagnosis not present

## 2020-05-04 DIAGNOSIS — G894 Chronic pain syndrome: Secondary | ICD-10-CM | POA: Diagnosis not present

## 2020-05-04 DIAGNOSIS — M545 Low back pain, unspecified: Secondary | ICD-10-CM | POA: Diagnosis not present

## 2020-05-04 DIAGNOSIS — Z992 Dependence on renal dialysis: Secondary | ICD-10-CM

## 2020-05-04 DIAGNOSIS — M5136 Other intervertebral disc degeneration, lumbar region: Secondary | ICD-10-CM | POA: Diagnosis not present

## 2020-05-04 DIAGNOSIS — G610 Guillain-Barre syndrome: Secondary | ICD-10-CM | POA: Diagnosis not present

## 2020-05-04 DIAGNOSIS — F1190 Opioid use, unspecified, uncomplicated: Secondary | ICD-10-CM

## 2020-05-04 MED ORDER — OXYCODONE-ACETAMINOPHEN 10-325 MG PO TABS: 1.0000 | ORAL_TABLET | Freq: Four times a day (QID) | ORAL | 0 refills | Status: AC | PRN

## 2020-05-04 MED ORDER — OXYCODONE-ACETAMINOPHEN 10-325 MG PO TABS: 1.0000 | ORAL_TABLET | Freq: Four times a day (QID) | ORAL | 0 refills | Status: DC | PRN

## 2020-05-04 NOTE — Progress Notes (Signed)
Virtual Visit via Telephone Note  I connected with Patrick Downs on 05/04/20 at  1:45 PM EST by telephone and verified that I am speaking with the correct person using two identifiers.  Location: Patient: Home Provider: Pain control center   I discussed the limitations, risks, security and privacy concerns of performing an evaluation and management service by telephone and the availability of in person appointments. I also discussed with the patient that there may be a patient responsible charge related to this service. The patient expressed understanding and agreed to proceed.   History of Present Illness: I spoke with Patrick Downs today via telephone as he was unable to do the video portion of the virtual conference and reports that he has been status quo. The quality characteristic of distribution of his low back pain and leg pain have been stable in nature. They continue to respond well to the Percocet taken on a every 6 hours basis as prescribed. He denies any side effects with the medications and mentions that they continue to give him approximately 75 to 80% relief lasting 4 to 6 hours before he gets recurrence of the same quality pain. No side effects reported. The distribution of his pain is stable in nature. Lower extremity strength and function are at baseline. He has just finished dialysis today.   Observations/Objective:   Current Outpatient Medications:  .  acetaminophen (TYLENOL) 500 MG tablet, Take 1,000-1,500 mg by mouth 2 (two) times daily as needed for moderate pain or headache., Disp: , Rfl:  .  AURYXIA 1 GM 210 MG(Fe) tablet, Take 420 mg by mouth 3 (three) times daily., Disp: , Rfl:  .  gabapentin (NEURONTIN) 300 MG capsule, Take 600 mg by mouth at bedtime. , Disp: , Rfl:  .  midodrine (PROAMATINE) 5 MG tablet, Take 5-10 mg by mouth See admin instructions. Take 10 mg by mouth before dialysis on Tuesdays, Thursdays, & Saturday and take 5 mg in the evening on  Tuesdays,  Thursdays, & Saturdays. On Monday, Wednesday, Friday and Sunday 5 mg by mouth every 8 hours., Disp: , Rfl:  .  [START ON 05/11/2020] oxyCODONE-acetaminophen (PERCOCET) 10-325 MG tablet, Take 1 tablet by mouth every 6 (six) hours as needed for pain., Disp: 120 tablet, Rfl: 0 .  [START ON 06/10/2020] oxyCODONE-acetaminophen (PERCOCET) 10-325 MG tablet, Take 1 tablet by mouth every 6 (six) hours as needed for pain., Disp: 120 tablet, Rfl: 0 Assessment and Plan: 1. DDD (degenerative disc disease), lumbar   2. Chronic bilateral low back pain without sciatica   3. Facet arthritis of lumbar region   4. ESRD on dialysis ([place])   5. 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 Chronic pain syndrome   7. Chronic, continuous use of opioids   8. Weakness of both lower extremities   Based on our discussion today and upon review of the [place] practitioner database information going to refill his medicine for January 18 and February 17 with return to clinic in 2 months. I requested that he continue follow-up with his primary care physicians and nephrologist allergist as usual. If he should have any problems with his medication he has been instructed to call for further evaluation. No other changes are made in his pain management protocol. I encouraged him to continue efforts at weight loss stretching strengthening as reviewed.  Follow Up Instructions:    I discussed the assessment and treatment plan with the patient. The patient was provided an opportunity to ask questions and all were answered.  The patient agreed with the plan and demonstrated an understanding of the instructions.   The patient was advised to call back or seek an in-person evaluation if the symptoms worsen or if the condition fails to improve as anticipated.  I provided 30 minutes of non-face-to-face time during this encounter.   Molli Barrows, MD

## 2020-05-06 DIAGNOSIS — Z992 Dependence on renal dialysis: Secondary | ICD-10-CM | POA: Diagnosis not present

## 2020-05-06 DIAGNOSIS — N186 End stage renal disease: Secondary | ICD-10-CM | POA: Diagnosis not present

## 2020-05-06 DIAGNOSIS — N2581 Secondary hyperparathyroidism of renal origin: Secondary | ICD-10-CM | POA: Diagnosis not present

## 2020-05-07 ENCOUNTER — Ambulatory Visit (INDEPENDENT_AMBULATORY_CARE_PROVIDER_SITE_OTHER): Payer: Medicare HMO | Admitting: Nurse Practitioner

## 2020-05-07 ENCOUNTER — Encounter (INDEPENDENT_AMBULATORY_CARE_PROVIDER_SITE_OTHER): Payer: Medicare HMO

## 2020-05-08 DIAGNOSIS — N186 End stage renal disease: Secondary | ICD-10-CM | POA: Diagnosis not present

## 2020-05-08 DIAGNOSIS — Z992 Dependence on renal dialysis: Secondary | ICD-10-CM | POA: Diagnosis not present

## 2020-05-11 DIAGNOSIS — Z992 Dependence on renal dialysis: Secondary | ICD-10-CM | POA: Diagnosis not present

## 2020-05-11 DIAGNOSIS — N186 End stage renal disease: Secondary | ICD-10-CM | POA: Diagnosis not present

## 2020-05-11 DIAGNOSIS — N2581 Secondary hyperparathyroidism of renal origin: Secondary | ICD-10-CM | POA: Diagnosis not present

## 2020-05-14 ENCOUNTER — Telehealth (INDEPENDENT_AMBULATORY_CARE_PROVIDER_SITE_OTHER): Payer: Self-pay | Admitting: Vascular Surgery

## 2020-05-14 ENCOUNTER — Emergency Department: Payer: Medicare HMO

## 2020-05-14 ENCOUNTER — Inpatient Hospital Stay: Payer: Medicare HMO

## 2020-05-14 ENCOUNTER — Inpatient Hospital Stay
Admission: EM | Admit: 2020-05-14 | Discharge: 2020-05-20 | DRG: 252 | Disposition: A | Discharge status: Home / Self Care | Payer: Medicare HMO | Attending: Internal Medicine | Admitting: Internal Medicine

## 2020-05-14 ENCOUNTER — Other Ambulatory Visit: Payer: Self-pay

## 2020-05-14 DIAGNOSIS — Z841 Family history of disorders of kidney and ureter: Secondary | ICD-10-CM | POA: Diagnosis not present

## 2020-05-14 DIAGNOSIS — Z89422 Acquired absence of other left toe(s): Secondary | ICD-10-CM

## 2020-05-14 DIAGNOSIS — A4901 Methicillin susceptible Staphylococcus aureus infection, unspecified site: Secondary | ICD-10-CM | POA: Diagnosis not present

## 2020-05-14 DIAGNOSIS — G650 Sequelae of Guillain-Barre syndrome: Secondary | ICD-10-CM | POA: Diagnosis present

## 2020-05-14 DIAGNOSIS — Z833 Family history of diabetes mellitus: Secondary | ICD-10-CM | POA: Diagnosis not present

## 2020-05-14 DIAGNOSIS — D649 Anemia, unspecified: Secondary | ICD-10-CM | POA: Diagnosis not present

## 2020-05-14 DIAGNOSIS — Z8616 Personal history of COVID-19: Secondary | ICD-10-CM

## 2020-05-14 DIAGNOSIS — B9561 Methicillin susceptible Staphylococcus aureus infection as the cause of diseases classified elsewhere: Secondary | ICD-10-CM | POA: Diagnosis not present

## 2020-05-14 DIAGNOSIS — I132 Hypertensive heart and chronic kidney disease with heart failure and with stage 5 chronic kidney disease, or end stage renal disease: Secondary | ICD-10-CM | POA: Diagnosis not present

## 2020-05-14 DIAGNOSIS — I9589 Other hypotension: Secondary | ICD-10-CM | POA: Diagnosis present

## 2020-05-14 DIAGNOSIS — Z8614 Personal history of Methicillin resistant Staphylococcus aureus infection: Secondary | ICD-10-CM

## 2020-05-14 DIAGNOSIS — Y832 Surgical operation with anastomosis, bypass or graft as the cause of abnormal reaction of the patient, or of later complication, without mention of misadventure at the time of the procedure: Secondary | ICD-10-CM | POA: Diagnosis present

## 2020-05-14 DIAGNOSIS — Z992 Dependence on renal dialysis: Secondary | ICD-10-CM | POA: Diagnosis not present

## 2020-05-14 DIAGNOSIS — N2581 Secondary hyperparathyroidism of renal origin: Secondary | ICD-10-CM | POA: Diagnosis present

## 2020-05-14 DIAGNOSIS — G629 Polyneuropathy, unspecified: Secondary | ICD-10-CM | POA: Diagnosis present

## 2020-05-14 DIAGNOSIS — T827XXA Infection and inflammatory reaction due to other cardiac and vascular devices, implants and grafts, initial encounter: Secondary | ICD-10-CM | POA: Diagnosis not present

## 2020-05-14 DIAGNOSIS — K219 Gastro-esophageal reflux disease without esophagitis: Secondary | ICD-10-CM | POA: Diagnosis present

## 2020-05-14 DIAGNOSIS — A419 Sepsis, unspecified organism: Secondary | ICD-10-CM | POA: Diagnosis not present

## 2020-05-14 DIAGNOSIS — I5032 Chronic diastolic (congestive) heart failure: Secondary | ICD-10-CM | POA: Diagnosis present

## 2020-05-14 DIAGNOSIS — N186 End stage renal disease: Secondary | ICD-10-CM | POA: Diagnosis not present

## 2020-05-14 DIAGNOSIS — D62 Acute posthemorrhagic anemia: Secondary | ICD-10-CM | POA: Diagnosis not present

## 2020-05-14 DIAGNOSIS — R600 Localized edema: Secondary | ICD-10-CM | POA: Diagnosis not present

## 2020-05-14 DIAGNOSIS — I10 Essential (primary) hypertension: Secondary | ICD-10-CM | POA: Diagnosis not present

## 2020-05-14 DIAGNOSIS — M21371 Foot drop, right foot: Secondary | ICD-10-CM | POA: Diagnosis present

## 2020-05-14 DIAGNOSIS — G610 Guillain-Barre syndrome: Secondary | ICD-10-CM | POA: Diagnosis not present

## 2020-05-14 DIAGNOSIS — M21372 Foot drop, left foot: Secondary | ICD-10-CM | POA: Diagnosis present

## 2020-05-14 DIAGNOSIS — L02413 Cutaneous abscess of right upper limb: Secondary | ICD-10-CM | POA: Diagnosis not present

## 2020-05-14 DIAGNOSIS — L0390 Cellulitis, unspecified: Secondary | ICD-10-CM | POA: Diagnosis present

## 2020-05-14 DIAGNOSIS — G8929 Other chronic pain: Secondary | ICD-10-CM | POA: Diagnosis present

## 2020-05-14 DIAGNOSIS — I120 Hypertensive chronic kidney disease with stage 5 chronic kidney disease or end stage renal disease: Secondary | ICD-10-CM | POA: Diagnosis not present

## 2020-05-14 DIAGNOSIS — M109 Gout, unspecified: Secondary | ICD-10-CM | POA: Diagnosis present

## 2020-05-14 DIAGNOSIS — E875 Hyperkalemia: Secondary | ICD-10-CM | POA: Diagnosis not present

## 2020-05-14 DIAGNOSIS — Z89421 Acquired absence of other right toe(s): Secondary | ICD-10-CM

## 2020-05-14 DIAGNOSIS — I509 Heart failure, unspecified: Secondary | ICD-10-CM | POA: Diagnosis not present

## 2020-05-14 DIAGNOSIS — L03113 Cellulitis of right upper limb: Secondary | ICD-10-CM | POA: Diagnosis present

## 2020-05-14 DIAGNOSIS — N185 Chronic kidney disease, stage 5: Secondary | ICD-10-CM | POA: Diagnosis not present

## 2020-05-14 DIAGNOSIS — F32A Depression, unspecified: Secondary | ICD-10-CM | POA: Diagnosis present

## 2020-05-14 DIAGNOSIS — D631 Anemia in chronic kidney disease: Secondary | ICD-10-CM | POA: Diagnosis not present

## 2020-05-14 LAB — CBC
HCT: 31.5 % — ABNORMAL LOW (ref 39.0–52.0)
Hemoglobin: 10.1 g/dL — ABNORMAL LOW (ref 13.0–17.0)
MCH: 28.3 pg (ref 26.0–34.0)
MCHC: 32.1 g/dL (ref 30.0–36.0)
MCV: 88.2 fL (ref 80.0–100.0)
Platelets: 112 10*3/uL — ABNORMAL LOW (ref 150–400)
RBC: 3.57 MIL/uL — ABNORMAL LOW (ref 4.22–5.81)
RDW: 16.5 % — ABNORMAL HIGH (ref 11.5–15.5)
WBC: 9.7 10*3/uL (ref 4.0–10.5)
nRBC: 0 % (ref 0.0–0.2)

## 2020-05-14 LAB — CBC WITH DIFFERENTIAL/PLATELET
Abs Immature Granulocytes: 0.04 10*3/uL (ref 0.00–0.07)
Basophils Absolute: 0 10*3/uL (ref 0.0–0.1)
Basophils Relative: 0 %
Eosinophils Absolute: 0.4 10*3/uL (ref 0.0–0.5)
Eosinophils Relative: 4 %
HCT: 35.9 % — ABNORMAL LOW (ref 39.0–52.0)
Hemoglobin: 11.5 g/dL — ABNORMAL LOW (ref 13.0–17.0)
Immature Granulocytes: 0 %
Lymphocytes Relative: 12 %
Lymphs Abs: 1.3 10*3/uL (ref 0.7–4.0)
MCH: 28 pg (ref 26.0–34.0)
MCHC: 32 g/dL (ref 30.0–36.0)
MCV: 87.3 fL (ref 80.0–100.0)
Monocytes Absolute: 1.1 10*3/uL — ABNORMAL HIGH (ref 0.1–1.0)
Monocytes Relative: 10 %
Neutro Abs: 8.2 10*3/uL — ABNORMAL HIGH (ref 1.7–7.7)
Neutrophils Relative %: 74 %
Platelets: 134 10*3/uL — ABNORMAL LOW (ref 150–400)
RBC: 4.11 MIL/uL — ABNORMAL LOW (ref 4.22–5.81)
RDW: 16.7 % — ABNORMAL HIGH (ref 11.5–15.5)
WBC: 11.2 10*3/uL — ABNORMAL HIGH (ref 4.0–10.5)
nRBC: 0 % (ref 0.0–0.2)

## 2020-05-14 LAB — COMPREHENSIVE METABOLIC PANEL
ALT: 16 U/L (ref 0–44)
AST: 17 U/L (ref 15–41)
Albumin: 3.9 g/dL (ref 3.5–5.0)
Alkaline Phosphatase: 60 U/L (ref 38–126)
Anion gap: 18 — ABNORMAL HIGH (ref 5–15)
BUN: 67 mg/dL — ABNORMAL HIGH (ref 6–20)
CO2: 22 mmol/L (ref 22–32)
Calcium: 8 mg/dL — ABNORMAL LOW (ref 8.9–10.3)
Chloride: 96 mmol/L — ABNORMAL LOW (ref 98–111)
Creatinine, Ser: 19.21 mg/dL — ABNORMAL HIGH (ref 0.61–1.24)
GFR, Estimated: 3 mL/min — ABNORMAL LOW (ref >60)
Glucose, Bld: 118 mg/dL — ABNORMAL HIGH (ref 70–99)
Potassium: 5.3 mmol/L — ABNORMAL HIGH (ref 3.5–5.1)
Sodium: 136 mmol/L (ref 135–145)
Total Bilirubin: 0.8 mg/dL (ref 0.3–1.2)
Total Protein: 8.2 g/dL — ABNORMAL HIGH (ref 6.5–8.1)

## 2020-05-14 LAB — PROTIME-INR
INR: 1.2 (ref 0.8–1.2)
Prothrombin Time: 14.8 seconds (ref 11.4–15.2)

## 2020-05-14 LAB — CREATININE, SERUM
Creatinine, Ser: 16.73 mg/dL — ABNORMAL HIGH (ref 0.61–1.24)
GFR, Estimated: 3 mL/min — ABNORMAL LOW (ref >60)

## 2020-05-14 LAB — LACTIC ACID, PLASMA: Lactic Acid, Venous: 1.5 mmol/L (ref 0.5–1.9)

## 2020-05-14 LAB — TROPONIN I (HIGH SENSITIVITY): Troponin I (High Sensitivity): 16 ng/L (ref <18)

## 2020-05-14 LAB — MRSA PCR SCREENING: MRSA by PCR: NEGATIVE

## 2020-05-14 LAB — APTT: aPTT: 48 seconds — ABNORMAL HIGH (ref 24–36)

## 2020-05-14 MED ORDER — VANCOMYCIN HCL IN DEXTROSE 1-5 GM/200ML-% IV SOLN: 1000.0000 mg | Freq: Once | INTRAVENOUS | Status: DC

## 2020-05-14 MED ORDER — PROMETHAZINE HCL 25 MG PO TABS
12.5000 mg | ORAL_TABLET | Freq: Four times a day (QID) | ORAL | Status: DC | PRN
  Filled 2020-05-14: qty 1

## 2020-05-14 MED ORDER — KETOROLAC TROMETHAMINE 30 MG/ML IJ SOLN
30.0000 mg | Freq: Once | INTRAMUSCULAR | Status: AC
  Administered 2020-05-14: 30 mg via INTRAVENOUS
  Filled 2020-05-14: qty 1

## 2020-05-14 MED ORDER — ACETAMINOPHEN 650 MG RE SUPP: 650.0000 mg | Freq: Four times a day (QID) | RECTAL | Status: DC | PRN

## 2020-05-14 MED ORDER — LACTATED RINGERS IV BOLUS (SEPSIS)
1000.0000 mL | Freq: Once | INTRAVENOUS | Status: AC
  Administered 2020-05-14: 1000 mL via INTRAVENOUS

## 2020-05-14 MED ORDER — LACTATED RINGERS IV SOLN: INTRAVENOUS | Status: DC

## 2020-05-14 MED ORDER — ACETAMINOPHEN 325 MG PO TABS
650.0000 mg | ORAL_TABLET | Freq: Four times a day (QID) | ORAL | Status: DC | PRN
  Administered 2020-05-15: 650 mg via ORAL
  Filled 2020-05-14 (×2): qty 2

## 2020-05-14 MED ORDER — LACTATED RINGERS IV BOLUS (SEPSIS): 1000.0000 mL | Freq: Once | INTRAVENOUS | Status: DC

## 2020-05-14 MED ORDER — VANCOMYCIN HCL IN DEXTROSE 1-5 GM/200ML-% IV SOLN
1000.0000 mg | Freq: Once | INTRAVENOUS | Status: AC
  Administered 2020-05-14: 1000 mg via INTRAVENOUS
  Filled 2020-05-14: qty 200

## 2020-05-14 MED ORDER — SODIUM CHLORIDE 0.9 % IV SOLN
2.0000 g | Freq: Once | INTRAVENOUS | Status: AC
  Administered 2020-05-14: 2 g via INTRAVENOUS
  Filled 2020-05-14: qty 20

## 2020-05-14 MED ORDER — FENTANYL CITRATE (PF) 100 MCG/2ML IJ SOLN
50.0000 ug | Freq: Once | INTRAMUSCULAR | Status: AC
  Administered 2020-05-14: 50 ug via INTRAVENOUS
  Filled 2020-05-14: qty 2

## 2020-05-14 MED ORDER — ALBUTEROL SULFATE (2.5 MG/3ML) 0.083% IN NEBU: 2.5000 mg | INHALATION_SOLUTION | RESPIRATORY_TRACT | Status: DC | PRN

## 2020-05-14 MED ORDER — HEPARIN SODIUM (PORCINE) 5000 UNIT/ML IJ SOLN
5000.0000 [IU] | Freq: Three times a day (TID) | INTRAMUSCULAR | Status: DC
  Administered 2020-05-14 – 2020-05-20 (×14): 5000 [IU] via SUBCUTANEOUS
  Filled 2020-05-14 (×14): qty 1

## 2020-05-14 MED ORDER — VANCOMYCIN HCL 1500 MG/300ML IV SOLN
1500.0000 mg | Freq: Once | INTRAVENOUS | Status: AC
  Administered 2020-05-14: 1500 mg via INTRAVENOUS
  Filled 2020-05-14: qty 300

## 2020-05-14 MED ORDER — DIPHENHYDRAMINE HCL 50 MG/ML IJ SOLN: 25.0000 mg | Freq: Once | INTRAMUSCULAR | Status: AC

## 2020-05-14 MED ORDER — DIPHENHYDRAMINE HCL 50 MG/ML IJ SOLN
INTRAMUSCULAR | Status: AC
  Administered 2020-05-14: 25 mg via INTRAVENOUS
  Filled 2020-05-14: qty 1

## 2020-05-14 NOTE — Telephone Encounter (Signed)
Discussed with Dr. Lucky Cowboy..If the patient is really draining pus he should go to the ED.  We can try to see him next week

## 2020-05-14 NOTE — Consult Note (Signed)
CODE SEPSIS - PHARMACY COMMUNICATION  **Broad Spectrum Antibiotics should be administered within 1 hour of Sepsis diagnosis**  Time Code Sepsis Called/Page Received: 1330  Antibiotics Ordered: 1330  Time of 1st antibiotic administration: 1356  Additional action taken by pharmacy: n/a  If necessary, Name of Provider/Nurse Contacted: n/a  Lorna Dibble ,PharmD Clinical Pharmacist  05/14/2020  1:49 PM

## 2020-05-14 NOTE — ED Provider Notes (Signed)
[facility] Emergency Department Provider Note   ____________________________________________   Event Date/Time   First MD Initiated Contact with Patient 05/14/20 1327     (approximate)  I have reviewed the triage vital signs and the nursing notes.   HISTORY  Chief Complaint Wound Infection    HPI Patrick Downs is a 45 y.o. male with a stated past medical history of CHF, end-stage renal disease on dialysis Tuesday/Thursday/Saturday who presents for oozing pus from around his AV fistula site.  Patient states that the site was placed approximately 1 month prior to arrival and has had no abnormalities before today.  Patient states that he got dialysis on Tuesday but missed yesterday's appointment.  Patient currently denies any vision changes, tinnitus, difficulty speaking, facial droop, sore throat, chest pain, shortness of breath, abdominal pain, nausea/vomiting/diarrhea, dysuria, or weakness/numbness/paresthesias in any extremity         Past Medical History:  Diagnosis Date  . CHF (congestive heart failure) ([place])   . Degenerative disc disease, lumbar   . Depression   . Dialysis patient ([place])   . ESRD (end stage renal disease) on dialysis ([place])   . Failure to thrive in adult   . GERD (gastroesophageal reflux disease)   . Gout   . Guillain Barr syndrome ([place])   . Guillain Barr syndrome ([place])   . Heart murmur   . HTN (hypertension)   . Hyperparathyroidism ([place])   . Kidney failure   . MRSA (methicillin resistant staph aureus) culture positive   . Peripheral neuropathy   . Pneumonia   . Renal insufficiency   . Respiratory failure [facility])     Patient Active Problem List   Diagnosis Date Noted  . Ulcer of great toe, right, with fat layer exposed ([place]) 02/13/2020  . COVID-19 01/28/2020  . Acute hypoxemic respiratory failure due to COVID-19 ([place]) 01/25/2020  . Sepsis ([place]) 01/25/2020  . Thrombocytopenia ([place]) 01/25/2020  . Elevated troponin  01/25/2020  . Peripheral neuropathy 01/25/2020  . Other disorders of phosphorus metabolism 11/27/2019  . Sepsis without acute organ dysfunction ([place]) 08/10/2019  . Hypercalcemia 05/15/2019  . Murmur 02/19/2019  . GERD (gastroesophageal reflux disease) 03/01/2018  . Degenerative disc disease, lumbar 01/09/2018  . HTN (hypertension) 12/28/2017  . Hyperkalemia 08/16/2017  . Cellulitis of right toe 04/25/2017  . Erectile dysfunction due to diseases classified elsewhere 05/31/2016  . Awaiting organ transplant status 08/23/2015  . Polyneuropathy, unspecified 08/02/2015  . Anemia in ESRD (end-stage renal disease) ([place]) 08/02/2015  . Gastroenteritis 08/02/2015  . Heart failure, unspecified ([place]) 08/02/2015  . Secondary hyperparathyroidism of renal origin ([place]) 08/02/2015  . Bacterial infection, unspecified 06/29/2015  . Unspecified open wound of right upper arm, initial encounter 06/29/2015  . Encounter for fitting of gastrointestinal device 06/04/2015  . Other specified local infections of the skin and subcutaneous tissue 05/20/2015  . Idiopathic chronic hypotension 12/11/2014  . Rectal pain 11/27/2014  . Other specified diseases of anus and rectum 11/27/2014  . Unspecified protein-calorie malnutrition ([place]) 10/08/2014  . Encounter for imaging study to confirm nasogastric (NG) tube placement   . Encounter for nasogastric (NG) tube placement   . Respiratory failure, acute ([place])   . Guillain Barr syndrome, lower ext weakness 08/27/2014  . CAP (community acquired pneumonia) 08/23/2014  . ESRD on dialysis ([place]) 08/23/2014  . A-V fistula ([place]) 08/23/2014  . ARDS (adult respiratory distress syndrome) ([place]) 08/23/2014  . Septic shock ([place]) 08/23/2014  . Hypotension 08/23/2014  . Acute respiratory  failure ([place]) 08/23/2014  . Fever   . Type 2 diabetes mellitus ([place]) 07/01/2014  . Type 2 diabetes mellitus with diabetic polyneuropathy ([place]) 07/01/2014  . Headache, unspecified 05/23/2014  .  Pruritus, unspecified 05/23/2014  . Shortness of breath 05/23/2014  . Iron deficiency anemia, unspecified 03/16/2014  . Complication of vascular dialysis catheter 01/08/2014  . Diarrhea, unspecified 01/08/2014  . Pain, unspecified 01/08/2014  . 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, implants and grafts, sequela 01/08/2014  . Anxiety disorder, unspecified 12/28/2013  . Photophobia 12/11/2013  . Spells 10/30/2013  . Dizziness 10/30/2013  . Other disorders of electrolyte and fluid balance, not elsewhere classified 09/16/2013  . Other ulcerative colitis without complications ([place]) [id number]  . Coagulation defect, unspecified ([place]) 04/21/2013  . Other allergy, initial encounter 04/21/2013  . Other mechanical complication of surgically created arteriovenous fistula, initial encounter ([place]) 02/19/2013  . Foot drop 12/09/2012  . History of Guillain-Barre syndrome 12/09/2012  . Other specified disorders of parathyroid gland ([place]) 05/15/2012  . Dependence on renal dialysis ([place]) 11/29/2010  . 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 A/V FISTULAGRAM Right 01/07/2018   Procedure: A/V FISTULAGRAM;  Surgeon: Algernon Huxley, MD;  Location: [place];  Service: Cardiovascular;  Laterality: Right;  . AMPUTATION TOE Right 06/08/2017   Procedure: AMPUTATION TOE RIGHT FIFTH TOE;  Surgeon: Samara Deist, DPM;  Location: [facility];  Service: Podiatry;  Laterality: Right;  . AMPUTATION TOE Left 05/17/2018   Procedure: RAY LEFT 5TH;  Surgeon: Samara Deist, DPM;  Location: [facility];  Service: Podiatry;  Laterality: Left;  . AMPUTATION TOE Right 03/05/2020   Procedure: AMPUTATION TOE IPJ X 2 RIGHT GREAT AND 3RD;  Surgeon: Samara Deist, DPM;  Location: [facility];  Service: Podiatry;  Laterality: Right;  . AV FISTULA PLACEMENT     x5      2 graphs  . AV FISTULA PLACEMENT Right  04/08/2020   Procedure: INSERTION OF ARTERIOVENOUS (AV) GORE-TEX GRAFT ARM ( ARTEGRAFT);  Surgeon: Algernon Huxley, MD;  Location: [facility];  Service: Vascular;  Laterality: Right;  . DIALYSIS/PERMA CATHETER INSERTION N/A 01/20/2019   Procedure: DIALYSIS/PERMA CATHETER INSERTION;  Surgeon: Algernon Huxley, MD;  Location: [place] [facility];  Service: Cardiovascular;  Laterality: N/A;  . DIALYSIS/PERMA CATHETER REMOVAL N/A 06/17/2018   Procedure: DIALYSIS/PERMA CATHETER REMOVAL;  Surgeon: Algernon Huxley, MD;  Location: [facility];  Service: Cardiovascular;  Laterality: N/A;  . PARATHYROIDECTOMY    . PERIPHERAL VASCULAR THROMBECTOMY Right 01/22/2019   Procedure: PERIPHERAL VASCULAR THROMBECTOMY;  Surgeon: Algernon Huxley, MD;  Location: [facility];  Service: Cardiovascular;  Laterality: Right;  . RENAL BIOPSY    . REVISON OF ARTERIOVENOUS FISTULA Right 02/07/2018   Procedure: REVISON OF ARTERIOVENOUS FISTULA;  Surgeon: Algernon Huxley, MD;  Location: [facility];  Service: Vascular;  Laterality: Right;  . TEE WITHOUT CARDIOVERSION N/A 01/22/2018   Procedure: TRANSESOPHAGEAL ECHOCARDIOGRAM (TEE);  Surgeon: Dionisio David, MD;  Location: [facility];  Service: Cardiovascular;  Laterality: N/A;  . tonsiilectomy    . TONSILLECTOMY    . tracheotomy    . UPPER EXTREMITY VENOGRAPHY Bilateral 02/23/2020   Procedure: UPPER EXTREMITY VENOGRAPHY;  Surgeon: Algernon Huxley, MD;  Location: [place];  Service: Cardiovascular;  Laterality: Bilateral;    Prior to Admission medications   Medication Sig Start Date End Date Taking? Authorizing Provider  acetaminophen (TYLENOL) 500 MG tablet Take 1,000-1,500 mg by mouth 2 (two) times daily as needed for moderate pain or headache.    [provider]  AURYXIA 1 GM 210 MG(Fe) tablet Take 420 mg by mouth 3 (three) times daily. 11/28/19   [provider]  gabapentin (NEURONTIN) 300 MG capsule Take 600 mg by mouth at bedtime.     [provider]  midodrine (PROAMATINE) 5 MG tablet Take 5-10 mg by mouth See admin instructions. Take 10 mg by mouth before dialysis on Tuesdays, Thursdays, & Saturday and take 5 mg in the evening on  Tuesdays, Thursdays, & Saturdays. On Monday, Wednesday, Friday and Sunday 5 mg by mouth every 8 hours.    [provider]  oxyCODONE-acetaminophen (PERCOCET) 10-325 MG tablet Take 1 tablet by mouth every 6 (six) hours as needed for pain. 05/11/20 06/10/20  Molli Barrows, MD  oxyCODONE-acetaminophen (PERCOCET) 10-325 MG tablet Take 1 tablet by mouth every 6 (six) hours as needed for pain. 06/10/20 07/10/20  Molli Barrows, MD    Allergies Hepatitis b vaccine, Ondansetron, Minoxidil, Morphine and related, and Omnipaque [iohexol]  Family History  Problem Relation Age of Onset  . Diabetes Mellitus II Father   . Kidney disease Father   . Kidney failure Paternal Grandfather   . Prostate cancer Neg Hx   . Kidney cancer Neg Hx   . Bladder Cancer Neg Hx     Social History Social History   Tobacco Use  . Smoking status: Never Smoker  . Smokeless tobacco: Never Used  Vaping Use  . Vaping Use: Never used  Substance Use Topics  . Alcohol use: No  . Drug use: Yes    Types: Marijuana    Review of Systems Constitutional: No fever/chills Eyes: No visual changes. ENT: No sore throat. Cardiovascular: Denies chest pain. Respiratory: Denies shortness of breath. Gastrointestinal: No abdominal pain.  No nausea, no vomiting.  No diarrhea. Genitourinary: Negative for dysuria. Musculoskeletal: Negative for acute arthralgias Skin: Erythema, induration, tenderness palpation, and purulent drainage from right AV fistula site in right upper extremity Neurological: Negative for headaches, weakness/numbness/paresthesias in any extremity Psychiatric: Negative for suicidal ideation/homicidal ideation   ____________________________________________   PHYSICAL EXAM:  VITAL SIGNS: ED Triage Vitals   Enc Vitals Group     BP 05/14/20 1302 (!) 90/40     Pulse Rate 05/14/20 1302 97     Resp 05/14/20 1302 17     Temp 05/14/20 1302 99.3 F (37.4 C)     Temp Source 05/14/20 1302 Oral     SpO2 05/14/20 1302 95 %     Weight 05/14/20 1257 265 lb (120.2 kg)     Height 05/14/20 1257 '6\' 2"'$  (1.88 m)     Head Circumference --      Peak Flow --      Pain Score 05/14/20 1257 8     Pain Loc --      Pain Edu? --      Excl. in [place]? --    Constitutional: Alert and oriented. Well appearing and in no acute distress. Eyes: Conjunctivae are normal. PERRL. Head: Atraumatic. Nose: No congestion/rhinnorhea. Mouth/Throat: Mucous membranes are moist. Neck: No stridor Cardiovascular: Grossly normal heart sounds.  Good peripheral circulation. Respiratory: Normal respiratory effort.  No retractions. Gastrointestinal: Soft and nontender. No distention. Musculoskeletal: No obvious deformities Neurologic:  Normal speech and language. No gross focal neurologic deficits are appreciated. Skin:  Skin is warm and dry.  Circumferential erythema from  the upper arm to mid forearm with a small area of seropurulent drainage overlying the AV fistula Psychiatric: Mood and affect are normal. Speech and behavior are normal.  ____________________________________________   LABS (all labs ordered are listed, but only abnormal results are displayed)  Labs Reviewed  COMPREHENSIVE METABOLIC PANEL - Abnormal; Notable for the following components:      Result Value   Potassium 5.3 (*)    Chloride 96 (*)    Glucose, Bld 118 (*)    BUN 67 (*)    Creatinine, Ser 19.21 (*)    Calcium 8.0 (*)    Total Protein 8.2 (*)    GFR, Estimated 3 (*)    Anion gap 18 (*)    All other components within normal limits  CBC WITH DIFFERENTIAL/PLATELET - Abnormal; Notable for the following components:   WBC 11.2 (*)    RBC 4.11 (*)    Hemoglobin 11.5 (*)    HCT 35.9 (*)    RDW 16.7 (*)    Platelets 134 (*)    Neutro Abs 8.2 (*)     Monocytes Absolute 1.1 (*)    All other components within normal limits  CULTURE, BLOOD (ROUTINE X 2)  CULTURE, BLOOD (ROUTINE X 2)  URINE CULTURE  MRSA PCR SCREENING  LACTIC ACID, PLASMA  LACTIC ACID, PLASMA  PROTIME-INR  APTT  URINALYSIS, COMPLETE (UACMP) WITH MICROSCOPIC  TROPONIN I (HIGH SENSITIVITY)   ____________________________________________  EKG  ED ECG REPORT I, Naaman Plummer, the attending physician, personally viewed and interpreted this ECG.  Date: 05/14/2020 EKG Time: 1415 Rate: 79 Rhythm: normal sinus rhythm QRS Axis: normal Intervals: normal ST/T Wave abnormalities: normal Narrative Interpretation: no evidence of acute ischemia  ____________________________________________  RADIOLOGY  ED MD interpretation: Single view portable x-ray of the chest shows no evidence of acute abnormalities including no pneumonia, pneumothorax, or widened mediastinum  Official radiology report(s): DG Chest Port 1 View  Result Date: 05/14/2020 CLINICAL DATA:  Sepsis. EXAM: PORTABLE CHEST 1 VIEW COMPARISON:  January 25, 2020. FINDINGS: The heart size and mediastinal contours are within normal limits. Both lungs are clear. No pneumothorax or pleural effusion is noted. Right internal jugular dialysis catheter is unchanged in position. The visualized skeletal structures are unremarkable. IMPRESSION: No active disease. Electronically Signed   By: Marijo Conception M.D.   On: 05/14/2020 13:40    ____________________________________________   PROCEDURES  Procedure(s) performed (including Critical Care):  Procedures   ____________________________________________   INITIAL IMPRESSION / ASSESSMENT AND PLAN / ED COURSE  As part of my medical decision making, I reviewed the following data within the [facility] notes reviewed and incorporated, Labs reviewed, EKG interpreted, Old chart reviewed, Radiograph reviewed and Notes from prior ED visits reviewed and  incorporated        Patient is a 45 year old male who presents for erythema and purulent drainage from around his AV fistula site in the right upper extremity  Differential diagnosis includes but is not limited to: Sepsis, cellulitis, facial infection, or abscess  Laboratory evaluation and ultrasound pending at this time  Dispo: Care of this patient will be signed out to the oncoming physician at the end of my shift.  All pertinent patient information conveyed and all questions answered.  All further care and disposition decisions will be made by the oncoming physician.      ____________________________________________   FINAL CLINICAL IMPRESSION(S) / ED DIAGNOSES  Final diagnoses:  None     ED Discharge  Orders    None       Note:  This document was prepared using Dragon voice recognition software and may include unintentional dictation errors.   Naaman Plummer, MD 05/14/20 1537

## 2020-05-14 NOTE — Telephone Encounter (Signed)
Patient stated that he has been able to get his dialysis but was not able to go yesterday due to transportation
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
## 2020-05-14 NOTE — ED Notes (Signed)
Pt a&ox4  ambulatory from hallway bed to C POD holding -- ambulates independently with steady gait.  RR even and unlabored on RA with symmetrical rise and fall of chest.  R chest perm cath dressing dry and intact.  Cardiac and pulse ox monitoring resumed in holding.  Abdomen soft nontender.  R elbow wound otherwise skin warm dry and intact -- R elbow dressing noted and is dry and intact.  Pt denies making urine; HD patient (tu/thurs/sat days).  Pt denies any immediate needs, questions, concerns - will monitor for acute changes and maintain plan of care

## 2020-05-14 NOTE — Telephone Encounter (Signed)
I left a detailed message on patient voicemail with provider recommendations and to call the office to reschedule his missed appointment.

## 2020-05-14 NOTE — Sepsis Progress Note (Signed)
eLink is monitoring this Code Sepsis. °

## 2020-05-14 NOTE — Consult Note (Signed)
Pharmacy Antibiotic Note  Patrick Downs is a 45 y.o. male admitted on 05/14/2020 with cellulitis draining pus from dialysis graft.  Pharmacy has been consulted for Vancomycin dosing.  Plan: Will load with 2.5g total (1g in ED; followed by 1.5g).  **Pt is ESRD-HD dependent with schedule PTA TTS. Last session Tuesday (1/18), missed Thursday (1/20) d/t transportation issue. Current infection appears to be dialysis graft site.  Will follow up Nephrology HD plan while inpatient further vanc dosing post-HD.  Also received CTX 2g x1 in ED  Height: '6\' 2"'$  (188 cm) Weight: 120.2 kg (265 lb) IBW/kg (Calculated) : 82.2  Temp (24hrs), Avg:99.3 F (37.4 C), Min:99.3 F (37.4 C), Max:99.3 F (37.4 C)  No results for input(s): WBC, CREATININE, LATICACIDVEN, VANCOTROUGH, VANCOPEAK, VANCORANDOM, GENTTROUGH, GENTPEAK, GENTRANDOM, TOBRATROUGH, TOBRAPEAK, TOBRARND, AMIKACINPEAK, AMIKACINTROU, AMIKACIN in the last 168 hours.  CrCl cannot be calculated (Patient's most recent lab result is older than the maximum 21 days allowed.).    Allergies  Allergen Reactions   Hepatitis B Vaccine     Other reaction(s): Unknown   Ondansetron Other (See Comments)    Stomach pain    Minoxidil Other (See Comments)    "put fluid around my heart", PERICARDIAL EFFUSION   Morphine And Related Other (See Comments)    Aggressive    Omnipaque [Iohexol] Itching and Other (See Comments)    Rigors on one occasion, widespread itching on a separate occasion (resolved with Benadryl), tremors    Antimicrobials this admission: Vancomycin (1/21 >>  Ceftriaxone (1/21 >>   Dose adjustments this admission: ESRD-HD (TTS PTA) - no current adjustments. Will f/u IP HD schedule for further dosing.  Microbiology results: 1/21 BCx: pending 1/21 UCx: pending   Thank you for allowing pharmacy to be a part of this patients care.  Shanon Brow [facility] Merriott 05/14/2020 1:33 PM

## 2020-05-14 NOTE — ED Notes (Signed)
Patient transported to Ultrasound 

## 2020-05-14 NOTE — ED Triage Notes (Addendum)
Pt states he had a dialysis graft placed to the right FA/AC 04/08/2020, states it was healing well until he noticed swelling around the site yesterday and today there is active oozing of puss from the site.[place] Kitchendenies any fever or chills.  Dialysis tues, thurs, Saturday, last was Tuesday, missed yesterday

## 2020-05-14 NOTE — H&P (Addendum)
History and Physical    Patrick Downs [id number] DOB: 02-Dec-1975 DOA: 05/14/2020  PCP: Baxter Hire, MD  Patient coming from: home  I have personally briefly reviewed patient's old medical records in [place]  Chief Complaint: redness  Swelling and drainage of blood for fistula incision site  HPI: Patrick Downs is a 45 y.o. 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 He called vascular clinic and he was referred to ED.  He notes no fever/chills/ n/v/d/sob/chest pain, abdominal pain , cough or sore throat. He states he is at his baseline line health other than noted infection of his right arm AVF graft. On evaluation in ed U/s noted no clots, but noted inflammation consistent with cellulitis and could not rule out phlegmon. Patient is admitted for iv antibiotics and vascular follow up. ED Course:  Temp:99.3, BP 90/40-111/70, hr 97, rr17 , sat 95% on ra  Labs: wbc 11.2, hgb 11.5 down from 13.6, but noted baseline around 12. plt 134 NA 136, K5.3, cl 96,  Glu 118,bun 67/ cr19.21, gfr3 AG18  Lactic 1.5 Cxr:NAD  tx : ctx/vanc, LR 1L, fentanyl, vanc, ketoralac Review of Systems: As per HPI otherwise 10 point review of systems negative.   Past Medical History:  Diagnosis Date  . CHF (congestive heart failure) ([place])   . Degenerative disc disease, lumbar   . Depression   . Dialysis patient ([place])   . ESRD (end stage renal disease) on dialysis ([place])   . Failure to thrive in adult   . GERD (gastroesophageal reflux disease)   . Gout   . Guillain Barr syndrome ([place])   . Guillain Barr syndrome ([place])   . Heart murmur   . HTN  (hypertension)   . Hyperparathyroidism ([place])   . Kidney failure   . MRSA (methicillin resistant staph aureus) culture positive   . Peripheral neuropathy   . Pneumonia   . Renal insufficiency   . Respiratory failure [facility])     Past Surgical History:  Procedure Laterality Date  . A/V FISTULAGRAM Right 01/07/2018   Procedure: A/V FISTULAGRAM;  Surgeon: Algernon Huxley, MD;  Location: [place] [facility];  Service: Cardiovascular;  Laterality: Right;  . AMPUTATION TOE Right 06/08/2017   Procedure: AMPUTATION TOE RIGHT FIFTH TOE;  Surgeon: Samara Deist, DPM;  Location: [facility];  Service: Podiatry;  Laterality: Right;  . AMPUTATION TOE Left 05/17/2018   Procedure: RAY LEFT 5TH;  Surgeon: Samara Deist, DPM;  Location: [facility];  Service: Podiatry;  Laterality: Left;  . AMPUTATION TOE Right 03/05/2020   Procedure: AMPUTATION TOE IPJ X 2 RIGHT GREAT AND 3RD;  Surgeon: Samara Deist, DPM;  Location: [facility];  Service: Podiatry;  Laterality: Right;  . AV FISTULA PLACEMENT     x5      2 graphs  . AV FISTULA PLACEMENT Right 04/08/2020   Procedure: INSERTION OF ARTERIOVENOUS (AV) GORE-TEX GRAFT ARM ( ARTEGRAFT);  Surgeon: Algernon Huxley, MD;  Location: [facility];  Service: Vascular;  Laterality: Right;  . DIALYSIS/PERMA CATHETER INSERTION N/A 01/20/2019   Procedure: DIALYSIS/PERMA CATHETER INSERTION;  Surgeon: Algernon Huxley, MD;  Location: [place] [facility];  Service: Cardiovascular;  Laterality: N/A;  . DIALYSIS/PERMA CATHETER REMOVAL N/A 06/17/2018   Procedure: DIALYSIS/PERMA CATHETER REMOVAL;  Surgeon: Algernon Huxley, MD;  Location: [place] [facility];  Service: Cardiovascular;  Laterality: N/A;  . PARATHYROIDECTOMY    . PERIPHERAL VASCULAR THROMBECTOMY Right 01/22/2019   Procedure: PERIPHERAL VASCULAR THROMBECTOMY;  Surgeon: Algernon Huxley, MD;  Location: [place] [facility];  Service: Cardiovascular;  Laterality: Right;  . RENAL BIOPSY    . REVISON OF ARTERIOVENOUS FISTULA Right 02/07/2018    Procedure: REVISON OF ARTERIOVENOUS FISTULA;  Surgeon: Algernon Huxley, MD;  Location: [facility];  Service: Vascular;  Laterality: Right;  . TEE WITHOUT CARDIOVERSION N/A 01/22/2018   Procedure: TRANSESOPHAGEAL ECHOCARDIOGRAM (TEE);  Surgeon: Dionisio David, MD;  Location: [facility];  Service: Cardiovascular;  Laterality: N/A;  . tonsiilectomy    . TONSILLECTOMY    . tracheotomy    . UPPER EXTREMITY VENOGRAPHY Bilateral 02/23/2020   Procedure: UPPER EXTREMITY VENOGRAPHY;  Surgeon: Algernon Huxley, MD;  Location: [place] [facility];  Service: Cardiovascular;  Laterality: Bilateral;     reports that he has never smoked. He has never used smokeless tobacco. He reports current drug use. Drug: Marijuana. He reports that he does not drink alcohol.  Allergies  Allergen Reactions  . Hepatitis B Vaccine     Other reaction(s): Unknown  . Ondansetron Other (See Comments)    Stomach pain   . Minoxidil Other (See Comments)    "put fluid around my heart", PERICARDIAL EFFUSION  . Morphine And Related Other (See Comments)    Aggressive   . Omnipaque [Iohexol] Itching and Other (See Comments)    Rigors on one occasion, widespread itching on a separate occasion (resolved with Benadryl), tremors    Family History  Problem Relation Age of Onset  . Diabetes Mellitus II Father   . Kidney disease Father   . Kidney failure Paternal Grandfather   . Prostate cancer Neg Hx   . Kidney cancer Neg Hx   . Bladder Cancer Neg Hx     Prior to Admission medications   Medication Sig Start Date End Date Taking? Authorizing Provider  acetaminophen (TYLENOL) 500 MG tablet Take 1,000-1,500 mg by mouth 2 (two) times daily as needed for moderate pain or headache.    [provider]  AURYXIA 1 GM 210 MG(Fe) tablet Take 420 mg by mouth 3 (three) times daily. 11/28/19   [provider]  gabapentin (NEURONTIN) 300 MG capsule Take 600 mg by mouth at bedtime.     [provider]  midodrine (PROAMATINE) 5  MG tablet Take 5-10 mg by mouth See admin instructions. Take 10 mg by mouth before dialysis on Tuesdays, Thursdays, & Saturday and take 5 mg in the evening on  Tuesdays, Thursdays, & Saturdays. On Monday, Wednesday, Friday and Sunday 5 mg by mouth every 8 hours.    [provider]  oxyCODONE-acetaminophen (PERCOCET) 10-325 MG tablet Take 1 tablet by mouth every 6 (six) hours as needed for pain. 05/11/20 06/10/20  Molli Barrows, MD  oxyCODONE-acetaminophen (PERCOCET) 10-325 MG tablet Take 1 tablet by mouth every 6 (six) hours as needed for pain. 06/10/20 07/10/20  Molli Barrows, MD    Physical Exam: Vitals:   05/14/20 1433 05/14/20 1442 05/14/20 1747 05/14/20 2006  BP: 112/66 112/66 118/81 111/70  Pulse:  83 71 86  Resp:  '18 16 16  '$ Temp:      TempSrc:      SpO2:  95% 94% 98%  Weight:      Height:         Vitals:   05/14/20 1433 05/14/20 1442 05/14/20 1747 05/14/20 2006  BP: 112/66 112/66 118/81 111/70  Pulse:  83 71 86  Resp:  '18 16 16  '$ Temp:      TempSrc:      SpO2:  95% 94% 98%  Weight:      Height:      Constitutional: NAD, calm, comfortable Eyes: PERRL, lids and conjunctivae normal ENMT: Mucous membranes are moist. Posterior pharynx clear of any exudate or lesions.Normal dentition.  Neck: normal, supple, no masses, no thyromegaly Respiratory: clear to auscultation bilaterally, no wheezing, no crackles. Normal respiratory effort. No accessory muscle use.  Cardiovascular: Regular rate and rhythm, no murmurs / rubs / gallops. No extremity edema. Warm extremities Abdomen: no tenderness, no masses palpated. No hepatosplenomegaly. Bowel sounds positive.  Musculoskeletal: no clubbing / cyanosis. No joint deformity upper and lower extremities. Good ROM, no contractures. Normal muscle tone.  Skin: right upper extremity with increase swelling and redness, at avf site noted sanguinous drainage, no pus noted. Neurologic: CN 2-12 grossly intact.  Strength 5/5 in all 4. Other than  noted b/l foot drop  Psychiatric: Normal judgment and insight. Alert and oriented x 3. Normal mood.    Labs on Admission: I have personally reviewed following labs and imaging studies  CBC: Recent Labs  Lab 05/14/20 1317  WBC 11.2*  NEUTROABS 8.2*  HGB 11.5*  HCT 35.9*  MCV 87.3  PLT [id number]*   Basic Metabolic Panel: Recent Labs  Lab 05/14/20 1317  NA 136  K 5.3*  CL 96*  CO2 22  GLUCOSE 118*  BUN 67*  CREATININE 19.21*  CALCIUM 8.0*   GFR: Estimated Creatinine Clearance: 6.8 mL/min (A) (by C-G formula based on SCr of 19.21 mg/dL (H)). Liver Function Tests: Recent Labs  Lab 05/14/20 1317  AST 17  ALT 16  ALKPHOS 60  BILITOT 0.8  PROT 8.2*  ALBUMIN 3.9   No results for input(s): LIPASE, AMYLASE in the last 168 hours. No results for input(s): AMMONIA in the last 168 hours. Coagulation Profile: Recent Labs  Lab 05/14/20 1358  INR 1.2   Cardiac Enzymes: No results for input(s): CKTOTAL, CKMB, CKMBINDEX, TROPONINI in the last 168 hours. BNP (last 3 results) No results for input(s): PROBNP in the last 8760 hours. HbA1C: No results for input(s): HGBA1C in the last 72 hours. CBG: No results for input(s): GLUCAP in the last 168 hours. Lipid Profile: No results for input(s): CHOL, HDL, LDLCALC, TRIG, CHOLHDL, LDLDIRECT in the last 72 hours. Thyroid Function Tests: No results for input(s): TSH, T4TOTAL, FREET4, T3FREE, THYROIDAB in the last 72 hours. Anemia Panel: No results for input(s): VITAMINB12, FOLATE, FERRITIN, TIBC, IRON, RETICCTPCT in the last 72 hours. Urine analysis: No results found for: COLORURINE, APPEARANCEUR, LABSPEC, PHURINE, GLUCOSEU, HGBUR, BILIRUBINUR, KETONESUR, PROTEINUR, UROBILINOGEN, NITRITE, LEUKOCYTESUR  Radiological Exams on Admission: DG Chest Port 1 View  Result Date: 05/14/2020 CLINICAL DATA:  Sepsis. EXAM: PORTABLE CHEST 1 VIEW COMPARISON:  January 25, 2020. FINDINGS: The heart size and mediastinal contours are within normal  limits. Both lungs are clear. No pneumothorax or pleural effusion is noted. Right internal jugular dialysis catheter is unchanged in position. The visualized skeletal structures are unremarkable. IMPRESSION: No active disease. Electronically Signed   By: Marijo Conception M.D.   On: 05/14/2020 13:40   [place] RT UPPER EXTREM LTD SOFT TISSUE NON VASCULAR  Result Date: 05/14/2020 CLINICAL  DATA:  Right arm abscess status post AV fistula EXAM: ULTRASOUND RIGHT UPPER EXTREMITY LIMITED TECHNIQUE: Ultrasound examination of the upper extremity soft tissues was performed in the area of clinical concern. COMPARISON:  None. FINDINGS: Status post AV fistula creation. Severe soft tissue edema around the [facility] fistula site. Hypoechoic areas around the fistula which may reflect severe cellulitis or phlegmon. Flow is seen throughout the vessels of the fistula 0 without thrombosis. No other solid or cystic mass. IMPRESSION: Severe soft tissue edema around the [facility] fistula site. Hypoechoic areas around the fistula which may reflect severe cellulitis or phlegmon. If there is further clinical concern, recommend a CT of the arm with contrast. Electronically Signed   By: Kathreen Devoid   On: 05/14/2020 16:11    EKG: Independently reviewed.   Assessment/Plan Complicated soft tissue infection of new Right upper extremity AVF graft site  -purulent cellulitis / cannot r/o phlegmon  - consider ct scan with contrast of upper extremity in am if note noted improvement -broad spectrum anti-biotics pre protocol  -f/u blood cultures consider ID consult   Anemia -drop in h/h' -with hx of bleeding from AVF site -cycle h/h   ESRD  -HD T/T/S -missed HD session on thurz -renal consult for HD in am  -patient has associated hyperkalemia mild 5.3  -repeat labs , treat base on trend  -ekg nsr, no hyperacute t-waves     GERD -no active issues  Chronic pain  - Guillain Barre remote with sequella of b/l foot drop -continue home regimen     hx of COVID 19 infection - s/p recovery - patient remains unvaccinated  HTN -prior hx but now of medications  -pt notes intermittent rare hypotension with prn use of midodrine -note use of this med is rare  CHF  -diastolic no active issues   Depression No active issue currently not medications   DVT prophylaxis:  scd  Code Status:  Full Family Communication:  None at beside  Disposition Plan:  patient  expected to be admitted greater than 2 midnights Consults called:  [place] Admission status:  Inpatient / med surg  Clance Boll MD [facility]   If 7PM-7AM, please contact night-coverage [email or web address] Password [facility]  05/14/2020, 8:09 PM

## 2020-05-15 LAB — CBC
HCT: 33.8 % — ABNORMAL LOW (ref 39.0–52.0)
Hemoglobin: 11 g/dL — ABNORMAL LOW (ref 13.0–17.0)
MCH: 28.3 pg (ref 26.0–34.0)
MCHC: 32.5 g/dL (ref 30.0–36.0)
MCV: 86.9 fL (ref 80.0–100.0)
Platelets: 126 10*3/uL — ABNORMAL LOW (ref 150–400)
RBC: 3.89 MIL/uL — ABNORMAL LOW (ref 4.22–5.81)
RDW: 16.7 % — ABNORMAL HIGH (ref 11.5–15.5)
WBC: 8.4 10*3/uL (ref 4.0–10.5)
nRBC: 0 % (ref 0.0–0.2)

## 2020-05-15 LAB — HEMOGLOBIN A1C
Hgb A1c MFr Bld: 5.4 % (ref 4.8–5.6)
Mean Plasma Glucose: 108.28 mg/dL

## 2020-05-15 LAB — COMPREHENSIVE METABOLIC PANEL
ALT: 15 U/L (ref 0–44)
AST: 12 U/L — ABNORMAL LOW (ref 15–41)
Albumin: 3.4 g/dL — ABNORMAL LOW (ref 3.5–5.0)
Alkaline Phosphatase: 61 U/L (ref 38–126)
Anion gap: 19 — ABNORMAL HIGH (ref 5–15)
BUN: 77 mg/dL — ABNORMAL HIGH (ref 6–20)
CO2: 18 mmol/L — ABNORMAL LOW (ref 22–32)
Calcium: 7.6 mg/dL — ABNORMAL LOW (ref 8.9–10.3)
Chloride: 99 mmol/L (ref 98–111)
Creatinine, Ser: 20.45 mg/dL — ABNORMAL HIGH (ref 0.61–1.24)
GFR, Estimated: 3 mL/min — ABNORMAL LOW (ref >60)
Glucose, Bld: 95 mg/dL (ref 70–99)
Potassium: 7.1 mmol/L (ref 3.5–5.1)
Sodium: 136 mmol/L (ref 135–145)
Total Bilirubin: 0.8 mg/dL (ref 0.3–1.2)
Total Protein: 7.8 g/dL (ref 6.5–8.1)

## 2020-05-15 LAB — SARS CORONAVIRUS 2 BY RT PCR (HOSPITAL ORDER, PERFORMED IN ~~LOC~~ HOSPITAL LAB): SARS Coronavirus 2: NEGATIVE

## 2020-05-15 LAB — VITAMIN D 25 HYDROXY (VIT D DEFICIENCY, FRACTURES): Vit D, 25-Hydroxy: 14.14 ng/mL — ABNORMAL LOW (ref 30–100)

## 2020-05-15 MED ORDER — CHLORHEXIDINE GLUCONATE CLOTH 2 % EX PADS
6.0000 | MEDICATED_PAD | Freq: Every day | CUTANEOUS | Status: DC
  Administered 2020-05-18 – 2020-05-20 (×2): 6 via TOPICAL

## 2020-05-15 MED ORDER — SODIUM CHLORIDE 0.9 % IV SOLN
INTRAVENOUS | Status: DC | PRN
  Administered 2020-05-15: 500 mL via INTRAVENOUS

## 2020-05-15 MED ORDER — SODIUM ZIRCONIUM CYCLOSILICATE 10 G PO PACK
10.0000 g | PACK | Freq: Two times a day (BID) | ORAL | Status: DC
  Administered 2020-05-15 – 2020-05-18 (×4): 10 g via ORAL
  Filled 2020-05-15 (×9): qty 1

## 2020-05-15 MED ORDER — VANCOMYCIN HCL IN DEXTROSE 1-5 GM/200ML-% IV SOLN
1000.0000 mg | Freq: Once | INTRAVENOUS | Status: AC
  Administered 2020-05-15: 1000 mg via INTRAVENOUS
  Filled 2020-05-15 (×2): qty 200

## 2020-05-15 MED ORDER — PANTOPRAZOLE SODIUM 40 MG IV SOLR
40.0000 mg | Freq: Once | INTRAVENOUS | Status: AC
  Administered 2020-05-15: 40 mg via INTRAVENOUS
  Filled 2020-05-15: qty 40

## 2020-05-15 MED ORDER — DIPHENHYDRAMINE HCL 50 MG/ML IJ SOLN
25.0000 mg | Freq: Once | INTRAMUSCULAR | Status: AC
  Administered 2020-05-15: 25 mg via INTRAVENOUS
  Filled 2020-05-15: qty 1

## 2020-05-15 MED ORDER — ALUM & MAG HYDROXIDE-SIMETH 200-200-20 MG/5ML PO SUSP
30.0000 mL | Freq: Four times a day (QID) | ORAL | Status: DC | PRN
  Administered 2020-05-15: 30 mL via ORAL
  Filled 2020-05-15: qty 30

## 2020-05-15 MED ORDER — KETOROLAC TROMETHAMINE 15 MG/ML IJ SOLN
15.0000 mg | Freq: Three times a day (TID) | INTRAMUSCULAR | Status: AC | PRN
  Administered 2020-05-15 (×2): 15 mg via INTRAVENOUS
  Filled 2020-05-15 (×2): qty 1

## 2020-05-15 MED ORDER — PANTOPRAZOLE SODIUM 40 MG PO TBEC
40.0000 mg | DELAYED_RELEASE_TABLET | Freq: Every evening | ORAL | Status: DC
  Administered 2020-05-16 – 2020-05-19 (×4): 40 mg via ORAL
  Filled 2020-05-15 (×5): qty 1

## 2020-05-15 MED ORDER — VANCOMYCIN VARIABLE DOSE PER UNSTABLE RENAL FUNCTION (PHARMACIST DOSING): Status: DC

## 2020-05-15 NOTE — Progress Notes (Signed)
[facility] Progress Note  Patient: Patrick Downs    [id number]  DOA: 05/14/2020     Date of Service: the patient was seen and examined on 05/15/2020  Chief Complaint  Patient presents with  . Wound Infection   Brief hospital course:  Patrick Downs is a 45 y.o. 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 He called vascular clinic and he was referred to ED.  He notes no fever/chills/ n/v/d/sob/chest pain, abdominal pain , cough or sore throat. He states he is at his baseline line health other than noted infection of his right arm AVF graft. On evaluation in ed U/s noted no clots, but noted inflammation consistent with cellulitis and could not rule out phlegmon. Patient is admitted for iv antibiotics and vascular follow up. ED Course:  Temp:99.3, BP 90/40-111/70, hr 97, rr17 , sat 95% on ra  Labs: wbc 11.2, hgb 11.5 down from 13.6, but noted baseline around 12. plt 134 NA 136, K5.3, cl 96,  Glu 118,bun 67/ cr19.21, gfr3 AG18  Lactic 1.5 Cxr:NAD      Assessment and Plan:   Complicated soft tissue infection of new Right upper extremity AVF graft site  -purulent cellulitis / cannot r/o phlegmon  - consider ct scan with contrast of upper extremity in am if note noted improvement -broad spectrum anti-biotics pre protocol  -f/u blood cultures consider ID consult  Vascular surgery consulted, plan is to remove AVG but patient needs to hemodialysis prior to the procedure due to hyperkalemia Procedure will be done today later on or tomorrow   Anemia -drop in h/h' -with hx of bleeding from AVF site -cycle h/h   ESRD  -HD T/T/S -missed  HD session on thurz -renal consult for HD in am  -patient has associated hyperkalemia mild 5.3  -repeat labs , treat base on trend  -ekg nsr, no hyperacute t-waves  K 7.1 Lokelma was given by nephrology, patient needs hemodialysis, nephrology aware     GERD, started pantoprazole 40 mg twice daily  Chronic pain  - Guillain Barre remote with sequella of b/l foot drop -continue home regimen    hx of COVID 19 infection - s/p recovery - patient remains unvaccinated  HTN -prior hx but now of medications  -pt notes intermittent rare hypotension with prn use of midodrine -note use of this med is rare  CHF  -diastolic no active issues   Depression No active issue currently not medications  Body mass index is 34.82 kg/m.    Interventions:        Diet: NPO for possible surgical intervention DVT Prophylaxis: SCD, pharmacological prophylaxis contraindicated due to For possible surgical intervention   Advance goals of care discussion: Full code  Family Communication: family was not present at bedside, at the time of interview.  The pt provided permission to discuss medical plan with the family. Opportunity was given to ask question and all questions were answered satisfactorily.   Disposition:  Pt is from home, admitted with RUE AV graft infection and cellulitis, still has AVG of infection, vascular surgery on the board, AVG will be removed.  ID will be consulted for antibiotic treatment and duration, which precludes a safe  discharge. Discharge to home , when cleared by vascular surgery.  Subjective: Patient was admitted overnight due to right upper extremity AV graft infection.  In the morning time patient was complaining of epigastric discomfort and acid reflux.  Patient denied any chest pain or shortness of breath, no palpitations.  Physical Exam: General:  alert oriented to time, place, and person.  Appear in mild distress, affect appropriate Eyes: PERRLA ENT: Oral  Mucosa Clear, moist  Neck: no JVD,  Cardiovascular: S1 and S2 Present, no Murmur,  Respiratory: good respiratory effort, Bilateral Air entry equal and Decreased, no Crackles, no wheezes Abdomen: Bowel Sound present, Soft and no tenderness,  Skin: no rashes Extremities: no Pedal edema, no calf tenderness Neurologic: without any new focal findings Gait not checked due to patient safety concerns  Vitals:   05/15/20 0300 05/15/20 0404 05/15/20 0532 05/15/20 1220  BP: 121/84 109/82  100/61  Pulse: 80 81  77  Resp: '13 18  15  '$ Temp:  97.6 F (36.4 C)  (!) 97.5 F (36.4 C)  TempSrc:  Oral  Oral  SpO2: 92% 100%  100%  Weight:   123 kg   Height:   '6\' 2"'$  (1.88 m)     Intake/Output Summary (Last 24 hours) at 05/15/2020 1514 Last data filed at 05/15/2020 1011 Gross per 24 hour  Intake 376.93 ml  Output 0 ml  Net 376.93 ml   Filed Weights   05/14/20 1257 05/15/20 0532  Weight: 120.2 kg 123 kg    Data Reviewed: I have personally reviewed and interpreted daily labs, tele strips, imagings as discussed above. I reviewed all nursing notes, pharmacy notes, vitals, pertinent old records I have discussed plan of care as described above with RN and patient/family.  CBC: Recent Labs  Lab 05/14/20 1317 05/14/20 2024 05/15/20 1051  WBC 11.2* 9.7 8.4  NEUTROABS 8.2*  --   --   HGB 11.5* 10.1* 11.0*  HCT 35.9* 31.5* 33.8*  MCV 87.3 88.2 86.9  PLT 134* 112* [id number]*   Basic Metabolic Panel: Recent Labs  Lab 05/14/20 1317 05/14/20 2024 05/15/20 1051  NA 136  --  136  K 5.3*  --  7.1*  CL 96*  --  99  CO2 22  --  18*  GLUCOSE 118*  --  95  BUN 67*  --  77*  CREATININE 19.21* 16.73* 20.45*  CALCIUM 8.0*  --  7.6*    Studies: [place] RT UPPER EXTREM LTD SOFT TISSUE NON VASCULAR  Result Date: 05/14/2020 CLINICAL DATA:  Right arm abscess status post AV fistula EXAM: ULTRASOUND RIGHT UPPER EXTREMITY LIMITED TECHNIQUE: Ultrasound examination of the upper extremity soft tissues was performed  in the area of clinical concern. COMPARISON:  None. FINDINGS: Status post AV fistula creation. Severe soft tissue edema around the [facility] fistula site. Hypoechoic areas around the fistula which may reflect severe cellulitis or phlegmon. Flow is seen throughout the vessels of the fistula 0 without thrombosis. No other solid or cystic mass. IMPRESSION: Severe soft tissue edema around the [facility] fistula site. Hypoechoic areas around the fistula which may reflect severe cellulitis or phlegmon. If there is further clinical concern, recommend a CT of the arm with contrast. Electronically Signed   By: Kathreen Devoid   On: 05/14/2020 16:11    Scheduled Meds: . Chlorhexidine Gluconate Cloth  6 each Topical Daily  . heparin  5,000 Units Subcutaneous Q8H  . [START ON 05/16/2020] pantoprazole  40 mg Oral QPM  . sodium zirconium  cyclosilicate  10 g Oral BID  . vancomycin variable dose per unstable renal function (pharmacist dosing)   Does not apply See admin instructions   Continuous Infusions: . lactated ringers    . vancomycin     PRN Meds: acetaminophen **OR** acetaminophen, albuterol, alum & mag hydroxide-simeth, ketorolac, promethazine  Time spent: 35 minutes  Author: Val Riles. MD [facility] 05/15/2020 3:14 PM  To reach On-call, see care teams to locate the attending and reach out to them via [email or web address]. If 7PM-7AM, please contact night-coverage If you still have difficulty reaching the attending provider, please page the [facility] (Director on Call) for [facility] on amion for assistance.

## 2020-05-15 NOTE — Anesthesia Preprocedure Evaluation (Addendum)
Anesthesia Evaluation  Patient identified by MRN, date of birth, ID band  Airway Mallampati: II  TM Distance: >3 FB     Dental  (+) Chipped   Pulmonary    Pulmonary exam normal        Cardiovascular hypertension, +CHF  Normal cardiovascular exam  Echo 2019: - Left ventricle: The cavity size was normal. There was moderate  concentric hypertrophy. Systolic function was normal. The  estimated ejection fraction was in the range of 60% to 65%. Wall  motion was normal; there were no regional wall motion  abnormalities. Doppler parameters are consistent with abnormal  left ventricular relaxation (grade 1 diastolic dysfunction).  - Aortic valve: Severe calcification on the non coronary cusp  without stenosis. Valve area (Vmax): 2.1 cm^2.  - Left atrium: The atrium was mildly dilated.    Neuro/Psych  Headaches, PSYCHIATRIC DISORDERS Anxiety Depression Peripheral neuropathy H/o GBS    GI/Hepatic PUD, GERD  ,  Endo/Other  diabetes  Renal/GU ESRF and DialysisRenal disease     Musculoskeletal   Abdominal (+) + obese,   Peds  Hematology  (+) Blood dyscrasia, anemia ,   Anesthesia Other Findings   Reproductive/Obstetrics                            Anesthesia Physical Anesthesia Plan  ASA: III  Anesthesia Plan: General   Post-op Pain Management:    Induction: Intravenous  PONV Risk Score and Plan:   Airway Management Planned: Oral ETT  Additional Equipment:   Intra-op Plan:   Post-operative Plan: Extubation in OR  Informed Consent: I have reviewed the patients History and Physical, chart, labs and discussed the procedure including the risks, benefits and alternatives for the proposed anesthesia with the patient or authorized representative who has indicated his/her understanding and acceptance.     Dental advisory given  Plan Discussed with: CRNA and Surgeon  Anesthesia  Plan Comments:         Anesthesia Quick Evaluation                                  Anesthesia Evaluation  Patient identified by MRN, date of birth, ID band Patient awake  General Assessment Comment:K=4.1  Reviewed: Allergy & Precautions, H&P , NPO status , reviewed documented beta blocker date and time   Airway Mallampati: II  TM Distance: >3 FB Neck ROM: full    Dental  (+) Teeth Intact   Pulmonary shortness of breath, pneumonia,    Pulmonary exam normal        Cardiovascular hypertension, +CHF  Normal cardiovascular exam+ Valvular Problems/Murmurs   2019 ECHO Study Conclusions   - Left ventricle: The cavity size was normal. There was moderate  concentric hypertrophy. Systolic function was normal. The  estimated ejection fraction was in the range of 60% to 65%. Wall  motion was normal; there were no regional wall motion  abnormalities. Doppler parameters are consistent with abnormal  left ventricular relaxation (grade 1 diastolic dysfunction).  - Aortic valve: Severe calcification on the non coronary cusp  without stenosis. Valve area (Vmax): 2.1 cm^2.  - Left atrium: The atrium was mildly dilated.  Mild MR   Neuro/Psych  Headaches, PSYCHIATRIC DISORDERS Anxiety Depression  Neuromuscular disease    GI/Hepatic PUD, GERD  Medicated and Controlled,  Endo/Other  diabetes  Renal/GU Dialysis and ESRFRenal disease     Musculoskeletal  (+)  Arthritis ,   Abdominal   Peds  Hematology  (+) Blood dyscrasia, anemia ,   Anesthesia Other Findings Past Medical History: No date: CHF (congestive heart failure) (HCC) No date: Degenerative disc disease, lumbar No date: Depression No date: Dialysis patient ([place]) No date: ESRD (end stage renal disease) on dialysis ([place]) No date: Failure to thrive in adult No date: GERD (gastroesophageal reflux disease) No date: Gout No date: Guillain Barr syndrome ([place]) No date: Guillain Barr syndrome  ([place]) No date: Heart murmur No date: HTN (hypertension) No date: Hyperparathyroidism ([place]) No date: Kidney failure No date: MRSA (methicillin resistant staph aureus) culture positive No date: Peripheral neuropathy No date: Pneumonia No date: Renal insufficiency No date: Respiratory failure [facility])  Past Surgical History: 01/07/2018: A/V FISTULAGRAM; Right     Comment:  Procedure: A/V FISTULAGRAM;  Surgeon: Algernon Huxley, MD;               Location: [place] [facility];  Service: Cardiovascular;              Laterality: Right; 06/08/2017: AMPUTATION TOE; Right     Comment:  Procedure: AMPUTATION TOE RIGHT FIFTH TOE;  Surgeon:               Samara Deist, DPM;  Location: [facility];  Service:               Podiatry;  Laterality: Right; 05/17/2018: AMPUTATION TOE; Left     Comment:  Procedure: RAY LEFT 5TH;  Surgeon: Samara Deist, DPM;               Location: [facility];  Service: Podiatry;  Laterality:               Left; 03/05/2020: AMPUTATION TOE; Right     Comment:  Procedure: AMPUTATION TOE IPJ X 2 RIGHT GREAT AND 3RD;                Surgeon: Samara Deist, DPM;  Location: [facility];                Service: Podiatry;  Laterality: Right; No date: AV FISTULA PLACEMENT     Comment:  x5      2 graphs 01/20/2019: DIALYSIS/PERMA CATHETER INSERTION; N/A     Comment:  Procedure: DIALYSIS/PERMA CATHETER INSERTION;  Surgeon:               Algernon Huxley, MD;  Location: [place] [facility];                Service: Cardiovascular;  Laterality: N/A; 06/17/2018: DIALYSIS/PERMA CATHETER REMOVAL; N/A     Comment:  Procedure: DIALYSIS/PERMA CATHETER REMOVAL;  Surgeon:               Algernon Huxley, MD;  Location: [place] [facility];                Service: Cardiovascular;  Laterality: N/A; No date: PARATHYROIDECTOMY 01/22/2019: PERIPHERAL VASCULAR THROMBECTOMY; Right     Comment:  Procedure: PERIPHERAL VASCULAR THROMBECTOMY;  Surgeon:               Algernon Huxley, MD;  Location: [place] [facility];                 Service: Cardiovascular;  Laterality: Right; No date: RENAL BIOPSY 02/07/2018: REVISON OF ARTERIOVENOUS FISTULA; Right     Comment:  Procedure: REVISON OF ARTERIOVENOUS FISTULA;  Surgeon:  Algernon Huxley, MD;  Location: [facility];  Service:               Vascular;  Laterality: Right; 01/22/2018: TEE WITHOUT CARDIOVERSION; N/A     Comment:  Procedure: TRANSESOPHAGEAL ECHOCARDIOGRAM (TEE);                Surgeon: Dionisio David, MD;  Location: [facility];                Service: Cardiovascular;  Laterality: N/A; No date: tonsiilectomy No date: TONSILLECTOMY No date: tracheotomy 02/23/2020: [place]; Bilateral     Comment:  Procedure: UPPER EXTREMITY VENOGRAPHY;  Surgeon: Algernon Huxley, MD;  Location: [place] [facility];  Service:               Cardiovascular;  Laterality: Bilateral;     Reproductive/Obstetrics                            Anesthesia Physical Anesthesia Plan  ASA: III  Anesthesia Plan: General   Post-op Pain Management: GA combined w/ Regional for post-op pain   Induction: Intravenous  PONV Risk Score and Plan: Treatment may vary due to age or medical condition and TIVA  Airway Management Planned: Nasal Cannula and Natural Airway  Additional Equipment:   Intra-op Plan:   Post-operative Plan:   Informed Consent: I have reviewed the patients History and Physical, chart, labs and discussed the procedure including the risks, benefits and alternatives for the proposed anesthesia with the patient or authorized representative who has indicated his/her understanding and acceptance.     Dental Advisory Given  Plan Discussed with: CRNA  Anesthesia Plan Comments: (Pt requests block w TIVA)       Anesthesia Quick Evaluation

## 2020-05-15 NOTE — Progress Notes (Addendum)
[facility]  Daily Progress Note  As discussed with Hospitalist and nursing previously, hyperkalemia at K 7.1 is an absolute contraindication to taking this patient to the operating room.  Renal has seen pt and but HD has not been completed yet.  - Recheck K in AM - Anes usually want K < 5.5 before considering General Anesthesia - Due to nature of an AVG excision, general anesthesia is usually necessary  Adele Barthel, MD, FACS, FSVS Covering for [facility]: [phone]   Addendum, Pt getting HD at bedside currently.  Informed consent obtained from pt.  Adele Barthel, MD, FACS, FSVS Covering for [facility]: [phone]  05/15/2020, 5:33 PM

## 2020-05-15 NOTE — Progress Notes (Signed)
Pt c/o right hand coldness and slightly discolored (pale) compared to left hand. Paged Vascular doc on call. Awaiting a call back.

## 2020-05-15 NOTE — Progress Notes (Addendum)
[facility]  Daily Progress Note   Assessment/Planning:   s/p R forearm AVG w/ artegraft ([id number]) complicated by infection ERSD-HD Hyperkalemia   Pt present with frank pus drainage from prior incision  Needs excision of AVG but needs HD prior to such given Saturday is his normal HD time and K 5.3  Broad spectrum abx  Type and screen in case transfusion needed  Depending on time of HD.  Will try to schedule pt for later today or tomorrow   Subjective    C/o pain and drainage from R arm access   Objective   Vitals:   05/15/20 0200 05/15/20 0300 05/15/20 0404 05/15/20 0532  BP: 126/87 121/84 109/82   Pulse: 78 80 81   Resp: '14 13 18   '$ Temp:   97.6 F (36.4 C)   TempSrc:   Oral   SpO2: 97% 92% 100%   Weight:    123 kg  Height:    '6\' 2"'$  (1.88 m)     Intake/Output Summary (Last 24 hours) at 05/15/2020 0945 Last data filed at 05/15/2020 0540 Gross per 24 hour  Intake 1376.93 ml  Output 0 ml  Net 1376.93 ml    PULM  CTAB  CV  RRR  GI  soft, NTND  VASC Upper arm swollen, antecubitum swollen and TTP to palpation, frank pus draining from antecubital incision  NEURO R hand: sensation and motor intact    Laboratory   CBC CBC Latest Ref Rng & Units 05/14/2020 05/14/2020 04/08/2020  WBC 4.0 - 10.5 K/uL 9.7 11.2(H) -  Hemoglobin 13.0 - 17.0 g/dL 10.1(L) 11.5(L) 13.6  Hematocrit 39.0 - 52.0 % 31.5(L) 35.9(L) 40.0  Platelets 150 - 400 K/uL 112(L) 134(L) -    BMET    Component Value Date/Time   NA 136 05/14/2020 1317   NA 136 08/22/2014 0653   K 5.3 (H) 05/14/2020 1317   K 5.1 08/22/2014 0653   CL 96 (L) 05/14/2020 1317   CL 95 (L) 08/22/2014 0653   CO2 22 05/14/2020 1317   CO2 27 08/22/2014 0653   GLUCOSE 118 (H) 05/14/2020 1317   GLUCOSE 176 (H) 08/22/2014 0653   BUN 67 (H) 05/14/2020 1317   BUN 71 (H) 08/22/2014 0653   CREATININE 16.73 (H) 05/14/2020 2024   CREATININE 8.87 (H) 08/22/2014 0653   CALCIUM 8.0 (L) 05/14/2020 1317    CALCIUM 7.1 (L) 08/22/2014 0653   GFRNONAA 3 (L) 05/14/2020 2024   GFRNONAA 7 (L) 08/22/2014 0653   GFRAA 4 (L) 01/27/2020 0511   GFRAA 8 (L) 08/22/2014 [id number]     Adele Barthel, MD, FACS, FSVS Covering for [facility]: [phone]   Addendum  Pt's case posted per OR front desk tomorrow as 2nd case.  Ok to eat today.  NPO after midnight  Adele Barthel, MD, FACS, FSVS Covering for [facility]: [phone]  05/15/2020, 10:43 AM

## 2020-05-15 NOTE — Progress Notes (Signed)
[facility]  ROUNDING NOTE   Subjective:   Patient denies any shortness of breath, edema. Having tenderness, redness and drainage  around new AVG. Has a dialysis catheter in place. Has not had dialysis since Tuesday.  Patient is asking for food but is not sure when he will have his procedure for his graft removal.  AVG was placed on  12/16   Objective:  Vital signs in last 24 hours:  Temp:  [97.5 F (36.4 C)-98.7 F (37.1 C)] 98.7 F (37.1 C) (01/22 1522) Pulse Rate:  [71-86] 77 (01/22 1220) Resp:  [13-18] 14 (01/22 1522) BP: (100-126)/(61-87) 114/73 (01/22 1522) SpO2:  [92 %-100 %] 99 % (01/22 1530) Weight:  RO:[id number] kg] 123 kg (01/22 0532)  Weight change:  Filed Weights   05/14/20 1257 05/15/20 0532  Weight: 120.2 kg 123 kg    Intake/Output: I/O last 3 completed shifts: In: 1376.9 [I.V.:376.9; IV Piggyback:1000] Out: 0    Intake/Output this shift:  No intake/output data recorded.  Physical Exam: General: NAD  Head: Normocephalic, atraumatic. Moist oral mucosal membranes  Eyes: Anicteric, PERRL  Neck: Supple, trachea midline  Lungs:  Clear to auscultation  Heart: Regular rate and rhythm  Abdomen:  Soft, nontender,   Extremities:  No peripheral edema.  Neurologic: Nonfocal, moving all four extremities  Skin: Erythema around AVG, wrapped.   Access: Right IJ catheter, Right UE AVG ( not using )    Basic Metabolic Panel: Recent Labs  Lab 05/14/20 1317 05/14/20 2024 05/15/20 1051  NA 136  --  136  K 5.3*  --  7.1*  CL 96*  --  99  CO2 22  --  18*  GLUCOSE 118*  --  95  BUN 67*  --  77*  CREATININE 19.21* 16.73* 20.45*  CALCIUM 8.0*  --  7.6*    Liver Function Tests: Recent Labs  Lab 05/14/20 1317 05/15/20 1051  AST 17 12*  ALT 16 15  ALKPHOS 60 61  BILITOT 0.8 0.8  PROT 8.2* 7.8  ALBUMIN 3.9 3.4*   No results for input(s): LIPASE, AMYLASE in the last 168 hours. No results for input(s): AMMONIA in the last 168 hours.  CBC: Recent  Labs  Lab 05/14/20 1317 05/14/20 2024 05/15/20 1051  WBC 11.2* 9.7 8.4  NEUTROABS 8.2*  --   --   HGB 11.5* 10.1* 11.0*  HCT 35.9* 31.5* 33.8*  MCV 87.3 88.2 86.9  PLT 134* 112* 126*    Cardiac Enzymes: No results for input(s): CKTOTAL, CKMB, CKMBINDEX, TROPONINI in the last 168 hours.  BNP: Invalid input(s): POCBNP  CBG: No results for input(s): GLUCAP in the last 168 hours.  Microbiology: Results for orders placed or performed during the hospital encounter of 05/14/20  Blood culture (routine x 2)     Status: None (Preliminary result)   Collection Time: 05/14/20  1:17 PM   Specimen: BLOOD  Result Value Ref Range Status   Specimen Description BLOOD BLOOD RIGHT FOREARM  Final   Special Requests   Final    BOTTLES DRAWN AEROBIC AND ANAEROBIC Blood Culture adequate volume   Culture   Final    NO GROWTH < 24 HOURS Performed at [facility], [address] [place], [place] [address]    Report Status PENDING  Incomplete  Blood culture (routine x 2)     Status: None (Preliminary result)   Collection Time: 05/14/20  1:17 PM   Specimen: BLOOD  Result Value Ref Range Status  Specimen Description BLOOD BLOOD RIGHT HAND  Final   Special Requests   Final    BOTTLES DRAWN AEROBIC AND ANAEROBIC Blood Culture adequate volume   Culture   Final    NO GROWTH < 24 HOURS Performed at [facility], [address] [place], [place] [address]    Report Status PENDING  Incomplete  MRSA PCR Screening     Status: None   Collection Time: 05/14/20  8:08 PM   Specimen: Nasal Mucosa; Nasopharyngeal  Result Value Ref Range Status   MRSA by PCR NEGATIVE NEGATIVE Final    Comment:        The GeneXpert MRSA Assay (FDA approved for NASAL specimens only), is one component of a comprehensive MRSA colonization surveillance program. It is not intended to diagnose MRSA infection nor to guide or monitor treatment for MRSA infections. Performed at [facility], [address] [place], [place] [address]   SARS Coronavirus 2 by RT PCR (hospital order, performed in [facility] hospital lab) Nasopharyngeal Nasopharyngeal Swab     Status: None   Collection Time: 05/15/20  1:05 AM   Specimen: Nasopharyngeal Swab  Result Value Ref Range Status   SARS Coronavirus 2 NEGATIVE NEGATIVE Final    Comment: (NOTE) SARS-CoV-2 target nucleic acids are NOT DETECTED.  The SARS-CoV-2 RNA is generally detectable in upper and lower respiratory specimens during the acute phase of infection. The lowest concentration of SARS-CoV-2 viral copies this assay can detect is 250 copies / mL. A negative result does not preclude SARS-CoV-2 infection and should not be used as the sole basis for treatment or other patient management decisions.  A negative result may occur with improper specimen collection / handling, submission of specimen other than nasopharyngeal swab, presence of viral mutation(s) within the areas targeted by this assay, and inadequate number of viral copies (<250 copies / mL). A negative result must be combined with clinical observations, patient history, and epidemiological information.  Fact Sheet for Patients:   StrictlyIdeas.no  Fact Sheet for Healthcare Providers: BankingDealers.co.za  This test is not yet approved or  cleared by the [place] FDA and has been authorized for detection and/or diagnosis of SARS-CoV-2 by FDA under an Emergency Use Authorization (EUA).  This EUA will remain in effect (meaning this test can be used) for the duration of the COVID-19 declaration under Section 564(b)(1) of the Act, 21 U.S.C. section 360bbb-3(b)(1), unless the authorization is terminated or revoked sooner.  Performed at [facility], [address] [place], [place] [address]     Coagulation Studies: Recent Labs    05/14/20 1358  LABPROT 14.8  INR 1.2    Urinalysis: No results for  input(s): COLORURINE, LABSPEC, PHURINE, GLUCOSEU, HGBUR, BILIRUBINUR, KETONESUR, PROTEINUR, UROBILINOGEN, NITRITE, LEUKOCYTESUR in the last 72 hours.  Invalid input(s): APPERANCEUR    Imaging: DG Chest Port 1 View  Result Date: 05/14/2020 CLINICAL DATA:  Sepsis. EXAM: PORTABLE CHEST 1 VIEW COMPARISON:  January 25, 2020. FINDINGS: The heart size and mediastinal contours are within normal limits. Both lungs are clear. No pneumothorax or pleural effusion is noted. Right internal jugular dialysis catheter is unchanged in position. The visualized skeletal structures are unremarkable. IMPRESSION: No active disease. Electronically Signed   By: Marijo Conception M.D.   On: 05/14/2020 13:40   [place] RT UPPER EXTREM LTD SOFT TISSUE NON VASCULAR  Result Date: 05/14/2020 CLINICAL DATA:  Right arm abscess status post AV fistula EXAM: ULTRASOUND RIGHT UPPER EXTREMITY LIMITED TECHNIQUE: Ultrasound  examination of the upper extremity soft tissues was performed in the area of clinical concern. COMPARISON:  None. FINDINGS: Status post AV fistula creation. Severe soft tissue edema around the [facility] fistula site. Hypoechoic areas around the fistula which may reflect severe cellulitis or phlegmon. Flow is seen throughout the vessels of the fistula 0 without thrombosis. No other solid or cystic mass. IMPRESSION: Severe soft tissue edema around the [facility] fistula site. Hypoechoic areas around the fistula which may reflect severe cellulitis or phlegmon. If there is further clinical concern, recommend a CT of the arm with contrast. Electronically Signed   By: Kathreen Devoid   On: 05/14/2020 16:11     Medications:   . lactated ringers    . vancomycin     . Chlorhexidine Gluconate Cloth  6 each Topical Daily  . heparin  5,000 Units Subcutaneous Q8H  . [START ON 05/16/2020] pantoprazole  40 mg Oral QPM  . sodium zirconium cyclosilicate  10 g Oral BID  . vancomycin variable dose per unstable renal function (pharmacist dosing)   Does not  apply See admin instructions   acetaminophen **OR** acetaminophen, albuterol, alum & mag hydroxide-simeth, ketorolac, promethazine  Assessment/ Plan:  [name] is a 45 y.o.  male with ESRD on HD, GERD, guillain barre, previous CIVD infection, HTN, CHF, depression who presented to the ED for evaluation of wound infection/ abscess of right arm.    1. ESRD on HD. TTS.  - patients last treatment was Tuesday.  - will plan for HD today, if possible with a 1k bath.   2. Hyperkalemia - administer 10 mg of lokelma bid  - K 7.1 - ensure patient is on a low potassium diet/ renal diet   3. Anemia of CKD  - hgb 11.0  - no indication for ESA   4. Infected AVG  - removal is planned for tomorrow with vascular surgery  - continue abx treatment    LOS: [place] 1/22/20223:56 PM

## 2020-05-15 NOTE — ED Notes (Signed)
Pt awake and alert in bed watching tv -- no signs of acute distress.  Denies any needs at this time.  Will continue to monitor for acute changes and maintain plan of care.

## 2020-05-16 ENCOUNTER — Encounter: Payer: Self-pay | Admitting: Internal Medicine

## 2020-05-16 ENCOUNTER — Inpatient Hospital Stay: Payer: Medicare HMO | Admitting: Anesthesiology

## 2020-05-16 ENCOUNTER — Encounter
Admission: EM | Disposition: A | Discharge status: Home / Self Care | Payer: Self-pay | Source: Home / Self Care | Attending: Student

## 2020-05-16 DIAGNOSIS — T827XXA Infection and inflammatory reaction due to other cardiac and vascular devices, implants and grafts, initial encounter: Secondary | ICD-10-CM | POA: Diagnosis present

## 2020-05-16 HISTORY — PX: REMOVAL OF GRAFT: SHX6361

## 2020-05-16 LAB — CBC
HCT: 29.4 % — ABNORMAL LOW (ref 39.0–52.0)
Hemoglobin: 9.7 g/dL — ABNORMAL LOW (ref 13.0–17.0)
MCH: 28.4 pg (ref 26.0–34.0)
MCHC: 33 g/dL (ref 30.0–36.0)
MCV: 86 fL (ref 80.0–100.0)
Platelets: 123 10*3/uL — ABNORMAL LOW (ref 150–400)
RBC: 3.42 MIL/uL — ABNORMAL LOW (ref 4.22–5.81)
RDW: 16.5 % — ABNORMAL HIGH (ref 11.5–15.5)
WBC: 6.2 10*3/uL (ref 4.0–10.5)
nRBC: 0 % (ref 0.0–0.2)

## 2020-05-16 LAB — BASIC METABOLIC PANEL
Anion gap: 17 — ABNORMAL HIGH (ref 5–15)
BUN: 51 mg/dL — ABNORMAL HIGH (ref 6–20)
CO2: 26 mmol/L (ref 22–32)
Calcium: 7.5 mg/dL — ABNORMAL LOW (ref 8.9–10.3)
Chloride: 97 mmol/L — ABNORMAL LOW (ref 98–111)
Creatinine, Ser: 15.63 mg/dL — ABNORMAL HIGH (ref 0.61–1.24)
GFR, Estimated: 4 mL/min — ABNORMAL LOW (ref >60)
Glucose, Bld: 95 mg/dL (ref 70–99)
Potassium: 5.1 mmol/L (ref 3.5–5.1)
Sodium: 140 mmol/L (ref 135–145)

## 2020-05-16 LAB — PHOSPHORUS: Phosphorus: 5.7 mg/dL — ABNORMAL HIGH (ref 2.5–4.6)

## 2020-05-16 LAB — MAGNESIUM: Magnesium: 2.1 mg/dL (ref 1.7–2.4)

## 2020-05-16 SURGERY — REMOVAL, GRAFT
Anesthesia: General | Laterality: Right

## 2020-05-16 MED ORDER — PROTAMINE SULFATE 10 MG/ML IV SOLN
INTRAVENOUS | Status: DC | PRN
  Administered 2020-05-16: 35 mg via INTRAVENOUS
  Administered 2020-05-16: 5 mg via INTRAVENOUS

## 2020-05-16 MED ORDER — DIPHENHYDRAMINE HCL 50 MG/ML IJ SOLN: 25.0000 mg | Freq: Once | INTRAMUSCULAR | Status: AC

## 2020-05-16 MED ORDER — DEXAMETHASONE SODIUM PHOSPHATE 10 MG/ML IJ SOLN
INTRAMUSCULAR | Status: DC | PRN
  Administered 2020-05-16: 10 mg via INTRAVENOUS

## 2020-05-16 MED ORDER — LIDOCAINE HCL (CARDIAC) PF 100 MG/5ML IV SOSY
PREFILLED_SYRINGE | INTRAVENOUS | Status: DC | PRN
  Administered 2020-05-16: 100 mg via INTRAVENOUS

## 2020-05-16 MED ORDER — PROPOFOL 10 MG/ML IV BOLUS
INTRAVENOUS | Status: AC
  Filled 2020-05-16: qty 60

## 2020-05-16 MED ORDER — PROMETHAZINE HCL 25 MG/ML IJ SOLN: 25.0000 mg | Freq: Four times a day (QID) | INTRAMUSCULAR | Status: DC | PRN

## 2020-05-16 MED ORDER — HYDROMORPHONE HCL 1 MG/ML IJ SOLN
INTRAMUSCULAR | Status: AC
  Administered 2020-05-16: 0.5 mg via INTRAVENOUS
  Filled 2020-05-16: qty 1

## 2020-05-16 MED ORDER — NALOXONE HCL 0.4 MG/ML IJ SOLN
0.4000 mg | INTRAMUSCULAR | Status: DC | PRN
  Filled 2020-05-16: qty 1

## 2020-05-16 MED ORDER — KETAMINE HCL 50 MG/5ML IJ SOSY
PREFILLED_SYRINGE | INTRAMUSCULAR | Status: AC
  Filled 2020-05-16: qty 5

## 2020-05-16 MED ORDER — SUCCINYLCHOLINE CHLORIDE 20 MG/ML IJ SOLN
INTRAMUSCULAR | Status: DC | PRN
  Administered 2020-05-16: 120 mg via INTRAVENOUS

## 2020-05-16 MED ORDER — DEXMEDETOMIDINE HCL 200 MCG/2ML IV SOLN: INTRAVENOUS | Status: DC | PRN

## 2020-05-16 MED ORDER — HYDROMORPHONE HCL 1 MG/ML IJ SOLN
1.0000 mg | INTRAMUSCULAR | Status: AC
  Administered 2020-05-16: 1 mg via INTRAVENOUS
  Filled 2020-05-16: qty 1

## 2020-05-16 MED ORDER — HEPARIN SODIUM (PORCINE) 1000 UNIT/ML IJ SOLN
INTRAMUSCULAR | Status: AC
  Filled 2020-05-16: qty 1

## 2020-05-16 MED ORDER — HYDROMORPHONE HCL 1 MG/ML IJ SOLN
0.5000 mg | INTRAMUSCULAR | Status: AC | PRN
  Administered 2020-05-16 (×2): 0.5 mg via INTRAVENOUS

## 2020-05-16 MED ORDER — FENTANYL CITRATE (PF) 100 MCG/2ML IJ SOLN
INTRAMUSCULAR | Status: AC
  Filled 2020-05-16: qty 2

## 2020-05-16 MED ORDER — ROCURONIUM BROMIDE 100 MG/10ML IV SOLN
INTRAVENOUS | Status: DC | PRN
  Administered 2020-05-16: 50 mg via INTRAVENOUS

## 2020-05-16 MED ORDER — DIPHENHYDRAMINE HCL 50 MG/ML IJ SOLN
INTRAMUSCULAR | Status: AC
  Administered 2020-05-16: 25 mg via INTRAVENOUS
  Filled 2020-05-16: qty 1

## 2020-05-16 MED ORDER — DIPHENHYDRAMINE HCL 50 MG/ML IJ SOLN
12.5000 mg | Freq: Four times a day (QID) | INTRAMUSCULAR | Status: DC | PRN
  Administered 2020-05-16 – 2020-05-18 (×3): 12.5 mg via INTRAVENOUS
  Filled 2020-05-16 (×3): qty 1

## 2020-05-16 MED ORDER — MIDAZOLAM HCL 2 MG/2ML IJ SOLN
INTRAMUSCULAR | Status: AC
  Filled 2020-05-16: qty 2

## 2020-05-16 MED ORDER — SODIUM CHLORIDE 0.9 % IR SOLN
Status: DC | PRN
  Administered 2020-05-16: 1000 mL

## 2020-05-16 MED ORDER — DEXMEDETOMIDINE (PRECEDEX) IN NS 20 MCG/5ML (4 MCG/ML) IV SYRINGE
PREFILLED_SYRINGE | INTRAVENOUS | Status: DC | PRN
  Administered 2020-05-16: 8 ug via INTRAVENOUS
  Administered 2020-05-16: 12 ug via INTRAVENOUS

## 2020-05-16 MED ORDER — FENTANYL CITRATE (PF) 100 MCG/2ML IJ SOLN
INTRAMUSCULAR | Status: AC
  Administered 2020-05-16: 25 ug via INTRAVENOUS
  Filled 2020-05-16: qty 2

## 2020-05-16 MED ORDER — PROPOFOL 10 MG/ML IV BOLUS
INTRAVENOUS | Status: DC | PRN
  Administered 2020-05-16: 100 mg via INTRAVENOUS
  Administered 2020-05-16: 50 mg via INTRAVENOUS
  Administered 2020-05-16: 200 mg via INTRAVENOUS

## 2020-05-16 MED ORDER — SODIUM CHLORIDE 0.9% FLUSH: 9.0000 mL | INTRAVENOUS | Status: DC | PRN

## 2020-05-16 MED ORDER — HYDROMORPHONE 1 MG/ML IV SOLN
INTRAVENOUS | Status: DC
  Administered 2020-05-16: 2.7 mg via INTRAVENOUS
  Administered 2020-05-16: 25 mg via INTRAVENOUS
  Administered 2020-05-17: 2.4 mg via INTRAVENOUS
  Administered 2020-05-17: 3 mg via INTRAVENOUS
  Administered 2020-05-17: 25 mg via INTRAVENOUS
  Administered 2020-05-17 (×2): 0.6 mg via INTRAVENOUS
  Administered 2020-05-17 – 2020-05-18 (×2): 2.4 mg via INTRAVENOUS
  Filled 2020-05-16: qty 30
  Filled 2020-05-16 (×2): qty 25

## 2020-05-16 MED ORDER — FENTANYL CITRATE (PF) 100 MCG/2ML IJ SOLN
INTRAMUSCULAR | Status: DC | PRN
  Administered 2020-05-16 (×4): 50 ug via INTRAVENOUS

## 2020-05-16 MED ORDER — VANCOMYCIN HCL 1000 MG IV SOLR
INTRAVENOUS | Status: DC | PRN
  Administered 2020-05-16: 1000 mg via INTRAVENOUS

## 2020-05-16 MED ORDER — HEPARIN SODIUM (PORCINE) 5000 UNIT/ML IJ SOLN
INTRAMUSCULAR | Status: AC
  Filled 2020-05-16: qty 1

## 2020-05-16 MED ORDER — VITAMIN D (ERGOCALCIFEROL) 1.25 MG (50000 UNIT) PO CAPS
50000.0000 [IU] | ORAL_CAPSULE | ORAL | Status: DC
  Administered 2020-05-16: 50000 [IU] via ORAL
  Filled 2020-05-16: qty 1

## 2020-05-16 MED ORDER — PHENYLEPHRINE HCL (PRESSORS) 10 MG/ML IV SOLN
INTRAVENOUS | Status: DC | PRN
  Administered 2020-05-16 (×8): 200 ug via INTRAVENOUS

## 2020-05-16 MED ORDER — HEPARIN SODIUM (PORCINE) 1000 UNIT/ML IJ SOLN
INTRAMUSCULAR | Status: DC | PRN
  Administered 2020-05-16: 12000 [IU] via INTRAVENOUS

## 2020-05-16 MED ORDER — FENTANYL CITRATE (PF) 100 MCG/2ML IJ SOLN: 25.0000 ug | INTRAMUSCULAR | Status: DC | PRN

## 2020-05-16 MED ORDER — VANCOMYCIN HCL 1000 MG IV SOLR
INTRAVENOUS | Status: AC
  Filled 2020-05-16: qty 1000

## 2020-05-16 MED ORDER — KETAMINE HCL 10 MG/ML IJ SOLN
INTRAMUSCULAR | Status: DC | PRN
  Administered 2020-05-16: 40 mg via INTRAVENOUS
  Administered 2020-05-16: 10 mg via INTRAVENOUS

## 2020-05-16 MED ORDER — SUGAMMADEX SODIUM 200 MG/2ML IV SOLN
INTRAVENOUS | Status: DC | PRN
  Administered 2020-05-16: 200 mg via INTRAVENOUS

## 2020-05-16 MED ORDER — PROTAMINE SULFATE 10 MG/ML IV SOLN
INTRAVENOUS | Status: AC
  Filled 2020-05-16: qty 5

## 2020-05-16 MED ORDER — MIDAZOLAM HCL 2 MG/2ML IJ SOLN
INTRAMUSCULAR | Status: DC | PRN
  Administered 2020-05-16: 2 mg via INTRAVENOUS

## 2020-05-16 MED ORDER — DIPHENHYDRAMINE HCL 12.5 MG/5ML PO ELIX
12.5000 mg | ORAL_SOLUTION | Freq: Four times a day (QID) | ORAL | Status: DC | PRN
  Filled 2020-05-16: qty 5

## 2020-05-16 MED ORDER — OXYCODONE-ACETAMINOPHEN 5-325 MG PO TABS: 1.0000 | ORAL_TABLET | ORAL | Status: DC | PRN

## 2020-05-16 MED ORDER — SODIUM CHLORIDE 0.9 % IV SOLN
INTRAVENOUS | Status: DC | PRN
  Administered 2020-05-16: 100 mL via INTRAMUSCULAR

## 2020-05-16 SURGICAL SUPPLY — 70 items
BAG DECANTER FOR FLEXI CONT (MISCELLANEOUS) ×2 IMPLANT
BLADE SURG SZ11 CARB STEEL (BLADE) ×2 IMPLANT
BNDG COHESIVE 6X5 TAN STRL LF (GAUZE/BANDAGES/DRESSINGS) ×2 IMPLANT
BNDG ELASTIC 4X5.8 VLCR NS LF (GAUZE/BANDAGES/DRESSINGS) ×2 IMPLANT
BNDG ELASTIC 6X5.8 VLCR NS LF (GAUZE/BANDAGES/DRESSINGS) ×2 IMPLANT
BNDG ESMARK 4X12 TAN STRL LF (GAUZE/BANDAGES/DRESSINGS) ×2 IMPLANT
BNDG GAUZE 4.5X4.1 6PLY STRL (MISCELLANEOUS) ×2 IMPLANT
BOOT SUTURE AID YELLOW STND (SUTURE) ×2 IMPLANT
BRUSH SCRUB EZ  4% CHG (MISCELLANEOUS) ×1
BRUSH SCRUB EZ 4% CHG (MISCELLANEOUS) ×1 IMPLANT
CANISTER SUCT 1200ML W/VALVE (MISCELLANEOUS) ×2 IMPLANT
CHLORAPREP W/TINT 26 (MISCELLANEOUS) ×2 IMPLANT
CNTNR SPEC 2.5X3XGRAD LEK (MISCELLANEOUS) ×1
CONT SPEC 4OZ STER OR WHT (MISCELLANEOUS) ×1
CONTAINER SPEC 2.5X3XGRAD LEK (MISCELLANEOUS) ×1 IMPLANT
COVER WAND RF STERILE (DRAPES) ×2 IMPLANT
CUFF TOURN SGL QUICK 24 (TOURNIQUET CUFF) ×1
CUFF TRNQT CYL 24X4X16.5-23 (TOURNIQUET CUFF) ×1 IMPLANT
DECANTER SPIKE VIAL GLASS SM (MISCELLANEOUS) IMPLANT
DRAIN PENROSE 5/8X18 LTX STRL (DRAIN) ×2 IMPLANT
ELECT CAUTERY BLADE 6.4 (BLADE) ×2 IMPLANT
ELECT REM PT RETURN 9FT ADLT (ELECTROSURGICAL) ×2
ELECTRODE REM PT RTRN 9FT ADLT (ELECTROSURGICAL) ×1 IMPLANT
GAUZE 4X4 16PLY RFD (DISPOSABLE) ×2 IMPLANT
GAUZE SPONGE 4X4 12PLY STRL (GAUZE/BANDAGES/DRESSINGS) ×2 IMPLANT
GEL ULTRASOUND 20GR AQUASONIC (MISCELLANEOUS) IMPLANT
GLOVE INDICATOR 7.5 STRL GRN (GLOVE) ×2 IMPLANT
GLOVE SURG ENC MOIS LTX SZ7 (GLOVE) ×4 IMPLANT
GOWN STRL REUS W/ TWL LRG LVL3 (GOWN DISPOSABLE) ×2 IMPLANT
GOWN STRL REUS W/ TWL XL LVL3 (GOWN DISPOSABLE) ×1 IMPLANT
GOWN STRL REUS W/TWL LRG LVL3 (GOWN DISPOSABLE) ×2
GOWN STRL REUS W/TWL XL LVL3 (GOWN DISPOSABLE) ×1
HEMOSTAT SURGICEL 2X3 (HEMOSTASIS) ×4 IMPLANT
IV NS 500ML (IV SOLUTION) ×1
IV NS 500ML BAXH (IV SOLUTION) ×1 IMPLANT
KIT TURNOVER KIT A (KITS) ×2 IMPLANT
LABEL OR SOLS (LABEL) ×2 IMPLANT
LOOP RED MAXI  1X406MM (MISCELLANEOUS) ×1
LOOP VESSEL MAXI 1X406 RED (MISCELLANEOUS) ×1 IMPLANT
LOOP VESSEL MINI 0.8X406 BLUE (MISCELLANEOUS) ×1 IMPLANT
LOOPS BLUE MINI 0.8X406MM (MISCELLANEOUS) ×1
MANIFOLD NEPTUNE II (INSTRUMENTS) ×2 IMPLANT
NS IRRIG 500ML POUR BTL (IV SOLUTION) ×2 IMPLANT
PACK EXTREMITY ARMC (MISCELLANEOUS) ×2 IMPLANT
PAD ABD DERMACEA PRESS 5X9 (GAUZE/BANDAGES/DRESSINGS) ×8 IMPLANT
PAD PREP 24X41 OB/GYN DISP (PERSONAL CARE ITEMS) ×2 IMPLANT
PULSAVAC PLUS IRRIG FAN TIP (DISPOSABLE) ×2
STAPLER SKIN PROX 35W (STAPLE) ×2 IMPLANT
STOCKINETTE IMPERV 14X48 (MISCELLANEOUS) ×2 IMPLANT
SUT ETHILON 2 0 FS 18 (SUTURE) ×2 IMPLANT
SUT MNCRL AB 4-0 PS2 18 (SUTURE) IMPLANT
SUT PROLENE 5-0 (SUTURE) ×8
SUT PROLENE 5-0 BB 24X2 ARM (SUTURE) ×4
SUT PROLENE 6 0 BV (SUTURE) ×10 IMPLANT
SUT SILK 2 0 (SUTURE) ×2
SUT SILK 2-0 18XBRD TIE 12 (SUTURE) ×1 IMPLANT
SUT SILK 3 0 (SUTURE) ×1
SUT SILK 3-0 18XBRD TIE 12 (SUTURE) ×1 IMPLANT
SUT SILK 4 0 (SUTURE) ×1
SUT SILK 4-0 18XBRD TIE 12 (SUTURE) ×1 IMPLANT
SUT VIC AB 2-0 SH 27 (SUTURE) ×2
SUT VIC AB 2-0 SH 27XBRD (SUTURE) ×2 IMPLANT
SUT VIC AB 3-0 SH 27 (SUTURE) ×1
SUT VIC AB 3-0 SH 27X BRD (SUTURE) ×1 IMPLANT
SUTURE PROLEN 5-0 BB 24X2 ARM (SUTURE) ×4 IMPLANT
SWAB CULTURE AMIES ANAERIB BLU (MISCELLANEOUS) ×2 IMPLANT
SYR 20ML LL LF (SYRINGE) ×2 IMPLANT
SYR 3ML LL SCALE MARK (SYRINGE) ×2 IMPLANT
SYR TB 1ML 27GX1/2 LL (SYRINGE) IMPLANT
TIP FAN IRRIG PULSAVAC PLUS (DISPOSABLE) ×1 IMPLANT

## 2020-05-16 NOTE — Progress Notes (Signed)
[facility] Progress Note  Patient: Patrick Downs    [id number]  DOA: 05/14/2020     Date of Service: the patient was seen and examined on 05/16/2020  Chief Complaint  Patient presents with  . Wound Infection   Brief hospital course:  Patrick Downs is a 45 y.o. 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 He called vascular clinic and he was referred to ED.  He notes no fever/chills/ n/v/d/sob/chest pain, abdominal pain , cough or sore throat. He states he is at his baseline line health other than noted infection of his right arm AVF graft. On evaluation in ed U/s noted no clots, but noted inflammation consistent with cellulitis and could not rule out phlegmon. Patient is admitted for iv antibiotics and vascular follow up. ED Course:  Temp:99.3, BP 90/40-111/70, hr 97, rr17 , sat 95% on ra  Labs: wbc 11.2, hgb 11.5 down from 13.6, but noted baseline around 12. plt 134 NA 136, K5.3, cl 96,  Glu 118,bun 67/ cr19.21, gfr3 AG18  Lactic 1.5 Cxr:NAD      Assessment and Plan:  AV graft infection, RUE Patient presented with complicated soft tissue infection of new Right upper extremity AVF graft site, purulent cellulitis / cannot r/o phlegmon  -Continue broad spectrum anti-biotics pre protocol  -f/u blood cultures  ID consult will be called tomorrow a.m. on Monday   Vascular surgery consulted, s/p excision of AV graft and intraoperative cultures sent  1/23 F/u fluid cx send from OR ID consult in am for ABx and duration of treatment   Anemia -drop in h/h' -with hx of bleeding from AVF site -cycle h/h   ESRD  -HD T/T/S -missed HD  session on thurz -Nephrology consulted -patient has associated hyperkalemia mild 5.3  -repeat labs , treat base on trend  -ekg nsr, no hyperacute t-waves  1/22 K 7.1 Lokelma was given by nephrology, patient needs hemodialysis, nephrology aware 1/23 K 5.1 today     GERD, started pantoprazole 40 mg twice daily  Chronic pain  - Guillain Barre remote with sequella of b/l foot drop -continue home regimen    hx of COVID 19 infection - s/p recovery - patient remains unvaccinated  HTN -prior hx but now of medications  -pt notes intermittent rare hypotension with prn use of midodrine -note use of this med is rare  CHF  -diastolic no active issues   Depression No active issue currently not medications  Body mass index is 34.82 kg/m.    Interventions:        Diet: NPO for possible surgical intervention DVT Prophylaxis: SCD, pharmacological prophylaxis contraindicated due to For possible surgical intervention   Advance goals of care discussion: Full code  Family Communication: family was not present at bedside, at the time of interview.  The pt provided permission to discuss medical plan with the family. Opportunity was given to ask question and all questions were answered satisfactorily.   Disposition:  Pt is from home, admitted with RUE AV graft infection and cellulitis, still has AVG of infection, vascular surgery on the board, AVG will be removed.  ID will be consulted for antibiotic treatment and duration, which precludes a safe  discharge. Discharge to home , when cleared by vascular surgery.  Subjective: No significant overnight events, patient was seen after excision of AV graft, dressing CDI, patient was in significant pain, which could be expected after surgery.  Patient denied any other active issues.   Physical Exam: General:  alert oriented to time, place, and person.  Appear in mild distress, affect appropriate Eyes: PERRLA ENT: Oral Mucosa Clear, moist   Neck: no JVD,  Cardiovascular: S1 and S2 Present, no Murmur,  Respiratory: good respiratory effort, Bilateral Air entry equal and Decreased, no Crackles, no wheezes Abdomen: Bowel Sound present, Soft and no tenderness,  Skin: no rashes Extremities: no Pedal edema, no calf tenderness Neurologic: without any new focal findings Gait not checked due to patient safety concerns  Vitals:   05/16/20 1301 05/16/20 1306 05/16/20 1308 05/16/20 1350  BP:   112/89 110/79  Pulse: 76 77 70 71  Resp: 17 (!) 0 (!) 24 16  Temp:   97.6 F (36.4 C) 97.7 F (36.5 C)  TempSrc:    Oral  SpO2: 93% 94% 100% 94%  Weight:      Height:        Intake/Output Summary (Last 24 hours) at 05/16/2020 1402 Last data filed at 05/16/2020 1142 Gross per 24 hour  Intake 927.67 ml  Output 1340 ml  Net -412.33 ml   Filed Weights   05/14/20 1257 05/15/20 0532  Weight: 120.2 kg 123 kg    Data Reviewed: I have personally reviewed and interpreted daily labs, tele strips, imagings as discussed above. I reviewed all nursing notes, pharmacy notes, vitals, pertinent old records I have discussed plan of care as described above with RN and patient/family.  CBC: Recent Labs  Lab 05/14/20 1317 05/14/20 2024 05/15/20 1051 05/16/20 0708  WBC 11.2* 9.7 8.4 6.2  NEUTROABS 8.2*  --   --   --   HGB 11.5* 10.1* 11.0* 9.7*  HCT 35.9* 31.5* 33.8* 29.4*  MCV 87.3 88.2 86.9 86.0  PLT 134* 112* 126* [id number]*   Basic Metabolic Panel: Recent Labs  Lab 05/14/20 1317 05/14/20 2024 05/15/20 1051 05/16/20 0708  NA 136  --  136 140  K 5.3*  --  7.1* 5.1  CL 96*  --  99 97*  CO2 22  --  18* 26  GLUCOSE 118*  --  95 95  BUN 67*  --  77* 51*  CREATININE 19.21* 16.73* 20.45* 15.63*  CALCIUM 8.0*  --  7.6* 7.5*  MG  --   --   --  2.1  PHOS  --   --   --  5.7*    Studies: No results found.  Scheduled Meds: . Chlorhexidine Gluconate Cloth  6 each Topical Daily  . heparin  5,000 Units Subcutaneous Q8H  . HYDROmorphone    Intravenous Q4H  . pantoprazole  40 mg Oral QPM  . sodium zirconium cyclosilicate  10 g Oral BID  . vancomycin variable dose per unstable renal function (pharmacist dosing)   Does not apply See admin instructions  . Vitamin D (Ergocalciferol)  50,000 Units Oral Q7 days   Continuous Infusions: . sodium chloride 0 mL/hr at 05/16/20 0123   PRN Meds: sodium chloride, acetaminophen **OR** acetaminophen, albuterol, alum & mag hydroxide-simeth, diphenhydrAMINE **OR** diphenhydrAMINE, naloxone **AND** sodium chloride flush, promethazine, promethazine  Time spent: 35 minutes  Author: Val Riles. MD [facility] 05/16/2020 2:02 PM  To reach On-call, see care teams to locate the attending and reach out to  them via [email or web address]. If 7PM-7AM, please contact night-coverage If you still have difficulty reaching the attending provider, please page the [facility] (Director on Call) for [facility] on amion for assistance.

## 2020-05-16 NOTE — Transfer of Care (Signed)
Immediate Anesthesia Transfer of Care Note  Patient: Patrick Downs  Procedure(s) Performed: REMOVAL OF GRAFT (Right )  Patient Location: PACU  Anesthesia Type:General  Level of Consciousness: awake  Airway & Oxygen Therapy: Patient Spontanous Breathing  Post-op Assessment: Report given to RN  Post vital signs: stable  Last Vitals:  Vitals Value Taken Time  BP    Temp    Pulse 82 05/16/20 1219  Resp 12 05/16/20 1219  SpO2 100 % 05/16/20 1219  Vitals shown include unvalidated device data.  Last Pain:  Vitals:   05/16/20 0800  TempSrc:   PainSc: 8       Patients Stated Pain Goal: 0 [phone]  Complications: No complications documented.

## 2020-05-16 NOTE — Progress Notes (Signed)
Talked to Dr. Bridgett Larsson about an extra dose of pain medication while waiting on the PCA to be fixed, he gave me a one time dose of Dilaudid.  I also asked about dressing changes.  He said to wait until tomorrow to start back with dressing changes.  I acknowledged the orders.

## 2020-05-16 NOTE — Progress Notes (Signed)
[facility]  ROUNDING NOTE   Subjective:   The patient reports that he is having pain around infected graft. He is scheduled for removal today.  He was able to finally get to eat yesterday. Received dialysis yesterday, denies any difficulties. Patient also on lokelma for hyperkalemia.   Objective:  Vital signs in last 24 hours:  Temp:  [97.5 F (36.4 C)-98.7 F (37.1 C)] 98 F (36.7 C) (01/23 0819) Pulse Rate:  [77-96] 86 (01/23 0819) Resp:  [14-18] 18 (01/23 0819) BP: (93-121)/(58-79) 106/78 (01/23 0819) SpO2:  [97 %-100 %] 98 % (01/23 0819)  Weight change:  Filed Weights   05/14/20 1257 05/15/20 0532  Weight: 120.2 kg 123 kg    Intake/Output: I/O last 3 completed shifts: In: 704.6 [P.O.:120; I.V.:384.6; IV Piggyback:200] Out: 1140 [Other:1140]   Intake/Output this shift:  No intake/output data recorded.  Physical Exam: General: NAD, sitting up in bed  Head: Normocephalic, atraumatic. Moist oral mucosal membranes  Eyes: Anicteric, PERRL  Neck: Supple, trachea midline  Lungs:  Clear to auscultation  Heart: Regular rate and rhythm  Abdomen:  Soft, nontender,   Extremities:  trace peripheral edema.  Neurologic: Nonfocal, moving all four extremities  Skin: Erythema around AVG, tender   Access: Right IJ catheter, Righ UE AVG (infected-not using)     Basic Metabolic Panel: Recent Labs  Lab 05/14/20 1317 05/14/20 2024 05/15/20 1051 05/16/20 0708  NA 136  --  136 140  K 5.3*  --  7.1* 5.1  CL 96*  --  99 97*  CO2 22  --  18* 26  GLUCOSE 118*  --  95 95  BUN 67*  --  77* 51*  CREATININE 19.21* 16.73* 20.45* 15.63*  CALCIUM 8.0*  --  7.6* 7.5*  MG  --   --   --  2.1  PHOS  --   --   --  5.7*    Liver Function Tests: Recent Labs  Lab 05/14/20 1317 05/15/20 1051  AST 17 12*  ALT 16 15  ALKPHOS 60 61  BILITOT 0.8 0.8  PROT 8.2* 7.8  ALBUMIN 3.9 3.4*   No results for input(s): LIPASE, AMYLASE in the last 168 hours. No results for  input(s): AMMONIA in the last 168 hours.  CBC: Recent Labs  Lab 05/14/20 1317 05/14/20 2024 05/15/20 1051 05/16/20 0708  WBC 11.2* 9.7 8.4 6.2  NEUTROABS 8.2*  --   --   --   HGB 11.5* 10.1* 11.0* 9.7*  HCT 35.9* 31.5* 33.8* 29.4*  MCV 87.3 88.2 86.9 86.0  PLT 134* 112* 126* 123*    Cardiac Enzymes: No results for input(s): CKTOTAL, CKMB, CKMBINDEX, TROPONINI in the last 168 hours.  BNP: Invalid input(s): POCBNP  CBG: No results for input(s): GLUCAP in the last 168 hours.  Microbiology: Results for orders placed or performed during the hospital encounter of 05/14/20  Blood culture (routine x 2)     Status: None (Preliminary result)   Collection Time: 05/14/20  1:17 PM   Specimen: BLOOD  Result Value Ref Range Status   Specimen Description BLOOD BLOOD RIGHT FOREARM  Final   Special Requests   Final    BOTTLES DRAWN AEROBIC AND ANAEROBIC Blood Culture adequate volume   Culture   Final    NO GROWTH 2 DAYS Performed at [facility], [address] [place], [place] [address]    Report Status PENDING  Incomplete  Blood culture (routine x 2)     Status:  None (Preliminary result)   Collection Time: 05/14/20  1:17 PM   Specimen: BLOOD  Result Value Ref Range Status   Specimen Description BLOOD BLOOD RIGHT HAND  Final   Special Requests   Final    BOTTLES DRAWN AEROBIC AND ANAEROBIC Blood Culture adequate volume   Culture   Final    NO GROWTH 2 DAYS Performed at [facility], [address] [place], [place] [address]    Report Status PENDING  Incomplete  MRSA PCR Screening     Status: None   Collection Time: 05/14/20  8:08 PM   Specimen: Nasal Mucosa; Nasopharyngeal  Result Value Ref Range Status   MRSA by PCR NEGATIVE NEGATIVE Final    Comment:        The GeneXpert MRSA Assay (FDA approved for NASAL specimens only), is one component of a comprehensive MRSA colonization surveillance program. It is not intended to diagnose MRSA infection nor  to guide or monitor treatment for MRSA infections. Performed at [facility], [address] [place], [place] [address]   SARS Coronavirus 2 by RT PCR (hospital order, performed in [facility] hospital lab) Nasopharyngeal Nasopharyngeal Swab     Status: None   Collection Time: 05/15/20  1:05 AM   Specimen: Nasopharyngeal Swab  Result Value Ref Range Status   SARS Coronavirus 2 NEGATIVE NEGATIVE Final    Comment: (NOTE) SARS-CoV-2 target nucleic acids are NOT DETECTED.  The SARS-CoV-2 RNA is generally detectable in upper and lower respiratory specimens during the acute phase of infection. The lowest concentration of SARS-CoV-2 viral copies this assay can detect is 250 copies / mL. A negative result does not preclude SARS-CoV-2 infection and should not be used as the sole basis for treatment or other patient management decisions.  A negative result may occur with improper specimen collection / handling, submission of specimen other than nasopharyngeal swab, presence of viral mutation(s) within the areas targeted by this assay, and inadequate number of viral copies (<250 copies / mL). A negative result must be combined with clinical observations, patient history, and epidemiological information.  Fact Sheet for Patients:   StrictlyIdeas.no  Fact Sheet for Healthcare Providers: BankingDealers.co.za  This test is not yet approved or  cleared by the [place] FDA and has been authorized for detection and/or diagnosis of SARS-CoV-2 by FDA under an Emergency Use Authorization (EUA).  This EUA will remain in effect (meaning this test can be used) for the duration of the COVID-19 declaration under Section 564(b)(1) of the Act, 21 U.S.C. section 360bbb-3(b)(1), unless the authorization is terminated or revoked sooner.  Performed at [facility], [address] [place], [place] [address]     Coagulation  Studies: Recent Labs    05/14/20 1358  LABPROT 14.8  INR 1.2    Urinalysis: No results for input(s): COLORURINE, LABSPEC, PHURINE, GLUCOSEU, HGBUR, BILIRUBINUR, KETONESUR, PROTEINUR, UROBILINOGEN, NITRITE, LEUKOCYTESUR in the last 72 hours.  Invalid input(s): APPERANCEUR    Imaging: DG Chest Port 1 View  Result Date: 05/14/2020 CLINICAL DATA:  Sepsis. EXAM: PORTABLE CHEST 1 VIEW COMPARISON:  January 25, 2020. FINDINGS: The heart size and mediastinal contours are within normal limits. Both lungs are clear. No pneumothorax or pleural effusion is noted. Right internal jugular dialysis catheter is unchanged in position. The visualized skeletal structures are unremarkable. IMPRESSION: No active disease. Electronically Signed   By: Marijo Conception M.D.   On: 05/14/2020 13:40   [place] RT UPPER EXTREM LTD SOFT TISSUE NON VASCULAR  Result Date: 05/14/2020 CLINICAL DATA:  Right arm abscess status post AV fistula EXAM: ULTRASOUND RIGHT UPPER EXTREMITY LIMITED TECHNIQUE: Ultrasound examination of the upper extremity soft tissues was performed in the area of clinical concern. COMPARISON:  None. FINDINGS: Status post AV fistula creation. Severe soft tissue edema around the [facility] fistula site. Hypoechoic areas around the fistula which may reflect severe cellulitis or phlegmon. Flow is seen throughout the vessels of the fistula 0 without thrombosis. No other solid or cystic mass. IMPRESSION: Severe soft tissue edema around the [facility] fistula site. Hypoechoic areas around the fistula which may reflect severe cellulitis or phlegmon. If there is further clinical concern, recommend a CT of the arm with contrast. Electronically Signed   By: Kathreen Devoid   On: 05/14/2020 16:11     Medications:   . [MAR Hold] sodium chloride Stopped (05/16/20 0123)   . [MAR Hold] Chlorhexidine Gluconate Cloth  6 each Topical Daily  . [MAR Hold] heparin  5,000 Units Subcutaneous Q8H  . [MAR Hold] pantoprazole  40 mg Oral QPM  . [MAR  Hold] sodium zirconium cyclosilicate  10 g Oral BID  . [MAR Hold] vancomycin variable dose per unstable renal function (pharmacist dosing)   Does not apply See admin instructions  . [MAR Hold] Vitamin D (Ergocalciferol)  50,000 Units Oral Q7 days   [MAR Hold] sodium chloride, [MAR Hold] acetaminophen **OR** [MAR Hold] acetaminophen, [MAR Hold] albuterol, [MAR Hold] alum & mag hydroxide-simeth, [MAR Hold] promethazine  Assessment/ Plan:  Patrick Downs is a 45 y.o.  male with ESRD on HD, GERD, guillain barre, previous CIVD infection, HTN, CHF, depression who presented to the ED for evaluation of wound infection/ abscess of right arm.    1. ESRD on HD. TTS.  - patients had HD yesterday. UF of 1140 mL  - No indication for HD today.  - Plan to dialyze on his regular scheduled day.   2. Hyperkalemia - administered 10 mg of lokelma bid, HD with a 1K bath  - K 5.1 ( 1/23), decreased from 7.1 yesterday  - patient needs to be maintain on a low potassium/ renal diet.   3. Anemia of CKD  - hgb 9.7  4. Infected AVG  - removal is planned for today with vascular surgery  - continue abx treatment    LOS: [place] 1/23/20229:45 AM

## 2020-05-16 NOTE — Op Note (Addendum)
OPERATIVE NOTE   PROCEDURE: Excision of right forearm arteriovenous graft  PRE-OPERATIVE DIAGNOSIS: infected right forearm arteriovenous graft  POST-OPERATIVE DIAGNOSIS: same as above   SURGEON: Adele Barthel, MD  ANESTHESIA: general  ESTIMATED BLOOD LOSS: 200 cc  FINDING(S): 1.  Frank pus draining from antecubital incision 2.  No frank abscess cavity within surgical incision 3.  No healing around the Artegraft anastomoses 4.  Good incorporation of Artegraft within forearm loop 5.  No frank evidence of infection of artery but some tearing of artery raises some concerns of such 6.  Thickened wall of brachial vein without any frank evidence of infection  SPECIMEN(S):  Anaerobic and aerobic specimens of purulent draiange, portions of the artegraft on the vein and artery for culture  INDICATIONS:   Patrick Downs is a 45 y.o. male who presents with infected right forearm arteriovenous graft constructed with Artegraft.  The patient is scheduled for right forearm arteriovenous graft excision.  The patient is aware the risks include but are not limited to: bleeding, infection, steal syndrome, nerve damage, need for additional procedures including additional drainage or repair procedures, and anesthesia related complications including myocardial infarction, stroke, and death.  The patient is aware of the risks of the procedure and elects to proceed forward.   DESCRIPTION: After full informed written consent was obtained from the patient, the patient was brought back to the operating room and placed supine upon the operating table.  Prior to induction, the patient received IV antibiotics.   After obtaining adequate anesthesia, the patient was then prepped and draped in the standard fashion for a right arm access procedure.    I put a sterile tourniquet on the upper right arm and gave the patient 12000 units of Heparin, which was therapeutic bolus.  After three minutes, I exsanguinated the right  arm with an Esmark bandage and inflated the tourniquet at 250 mm Hg.    I then extended the incision over the prior antecubital incision.  There was already an open defect that tracked down to the anastomoses.  I took anaerobic and aerobic cultures of the purulent fluid.  There was some evidence of some chronicity but no frank developed abscess cavity.  I could easily bluntly dissect out the arterial and venous anastomosis.    I then made a transverse incision over the apex of the forearm loop.  I dissected out the Artegraft here with electrocautery.  I then clamped each limb of the graft distal to the anastomoses.  I transected each limb of the graft.I  Blunt dissected the tissue around each graft limb.  While I could free up each limb for 2/3 of its length of the forearm, the distal to mid portions appeared especially well incorporated.  I had to make an counter incision over each limb to completely free up the graft.  I removed each limb and passed them off the field.  I then turned my attention to what I thought was the venous limb of the graft.  I transected the residual Artegraft longitudinally to visualize the anastomotic line.  I use a 11-blade to fully free up the Artegraft.  This was passed off the field as specimen for cultures.  This vessel appeared to be pliable, more suggestive of a vein.  I repaired the presumed venotomy with a running stitch of 5-0 Prolene.    I then turned my attention to what I thought was the arterial limb of the graft.  I repeated the same process, splitting the  graft longitudinally to visualize the anastomotic line.  I took the graft off the vessel.  Examining the wall, it appeared thickened, and I thought this was likely the arterial anastomosis.  The geometry would require a patch angioplasty if arterial.  I dropped the tourniquet and it became evident that venous bleeding was present in from this vessel not arterial.  I repaired this vein with a running stitch of 5-0  Prolene.    At this point, I dropped the tourniquet and packed Surgicel in this incision over the brachial artery.  I turned my attention to fenestrating a 1/2" Penrose drain which I routed from the apical incision to the counter incisions first, then I routed each end of the drain into the antecubital incision.    At this point, I noticed that there continued to be significant bleeding from the antecubital wound.  I washed out the wound and active bleeding was developing from the proximal end of the brachial artery.  I retried to repair this with multiple 6-0 Prolene stitches.  I was evident that the artery was tearing with some portions of the wall weakened.  I was eventually able to control bleeding from the artery with multiple 6-0 and 5-0 Prolene stitches.  I washed out the antecubital incision with 1 L sterile normal saline with a Pulsavac.  There was no further active bleeding except from the skin edge which was controlled with electrocautery.  The patient was given 40 mg of Protamine and Surgicel was repacked in the wound.  After holding pressure for several minutes, all active bleeding was completed.    I repaired each of the counter incisions with a double layer of 3-0 Vicryl and then skin was stapled.   I turned my attention to the antecubital incision.  I made certain each of the two ends of the Penrose drain were adjacent to the vein and artery.  I placed several 2-0 Vicryl horizontal mattress sutures but the tissue was extremely friable.  This required multiple attempts to even placed 3 sutures.  I placed two 2-0 Nylon sutures in the skin and subcutaneous tissue to offload the tension on this incision.  I then reapproximated the skin with spaced staples to allow any underlying drainage.  Unfortunately, the artery is at the base of this wound, so I did not feel it could be managed with just wet-to-dry dressing.  I placed sterile 4 x 4 dressing around the Penrose at the apical incision which  was left open.  I placed sterile 4 x4 dressing around the antecubital incision.  ABD pad were placed around all incisions.  These were affixed with a Kerlix.  I then wrapped the arm from hand up to shoulder with two ACE bandages.   COMPLICATIONS: none  CONDITION: stable  Adele Barthel, MD, FACS, FSVS Covering for [facility]: [phone]  05/16/2020, 12:47 PM

## 2020-05-16 NOTE — Anesthesia Procedure Notes (Signed)
Procedure Name: Intubation Date/Time: 05/16/2020 9:57 AM Performed by: Leander Rams, CRNA Pre-anesthesia Checklist: Patient identified, Emergency Drugs available, Suction available, Patient being monitored and Timeout performed Patient Re-evaluated:Patient Re-evaluated prior to induction Oxygen Delivery Method: Circle system utilized Preoxygenation: Pre-oxygenation with 100% oxygen Induction Type: IV induction Ventilation: Mask ventilation with difficulty Laryngoscope Size: McGraph and 4 Grade View: Grade I Tube type: Oral Tube size: 8.0 mm Number of attempts: 1 Airway Equipment and Method: Stylet Placement Confirmation: ETT inserted through vocal cords under direct vision,  positive ETCO2,  CO2 detector and breath sounds checked- equal and bilateral Tube secured with: Tape Dental Injury: Teeth and Oropharynx as per pre-operative assessment

## 2020-05-17 ENCOUNTER — Encounter: Payer: Self-pay | Admitting: Vascular Surgery

## 2020-05-17 DIAGNOSIS — N186 End stage renal disease: Secondary | ICD-10-CM | POA: Diagnosis not present

## 2020-05-17 DIAGNOSIS — A4901 Methicillin susceptible Staphylococcus aureus infection, unspecified site: Secondary | ICD-10-CM

## 2020-05-17 DIAGNOSIS — T827XXA Infection and inflammatory reaction due to other cardiac and vascular devices, implants and grafts, initial encounter: Principal | ICD-10-CM

## 2020-05-17 DIAGNOSIS — D649 Anemia, unspecified: Secondary | ICD-10-CM | POA: Diagnosis not present

## 2020-05-17 DIAGNOSIS — L02413 Cutaneous abscess of right upper limb: Secondary | ICD-10-CM

## 2020-05-17 DIAGNOSIS — I10 Essential (primary) hypertension: Secondary | ICD-10-CM

## 2020-05-17 LAB — BASIC METABOLIC PANEL
Anion gap: 18 — ABNORMAL HIGH (ref 5–15)
BUN: 73 mg/dL — ABNORMAL HIGH (ref 6–20)
CO2: 21 mmol/L — ABNORMAL LOW (ref 22–32)
Calcium: 7.3 mg/dL — ABNORMAL LOW (ref 8.9–10.3)
Chloride: 100 mmol/L (ref 98–111)
Creatinine, Ser: 17.99 mg/dL — ABNORMAL HIGH (ref 0.61–1.24)
GFR, Estimated: 3 mL/min — ABNORMAL LOW (ref >60)
Glucose, Bld: 156 mg/dL — ABNORMAL HIGH (ref 70–99)
Potassium: 6.1 mmol/L — ABNORMAL HIGH (ref 3.5–5.1)
Sodium: 139 mmol/L (ref 135–145)

## 2020-05-17 LAB — CBC
HCT: 29.3 % — ABNORMAL LOW (ref 39.0–52.0)
Hemoglobin: 9.5 g/dL — ABNORMAL LOW (ref 13.0–17.0)
MCH: 28.4 pg (ref 26.0–34.0)
MCHC: 32.4 g/dL (ref 30.0–36.0)
MCV: 87.5 fL (ref 80.0–100.0)
Platelets: 147 10*3/uL — ABNORMAL LOW (ref 150–400)
RBC: 3.35 MIL/uL — ABNORMAL LOW (ref 4.22–5.81)
RDW: 16.6 % — ABNORMAL HIGH (ref 11.5–15.5)
WBC: 9.2 10*3/uL (ref 4.0–10.5)
nRBC: 0 % (ref 0.0–0.2)

## 2020-05-17 MED ORDER — FERRIC CITRATE 1 GM 210 MG(FE) PO TABS
420.0000 mg | ORAL_TABLET | Freq: Three times a day (TID) | ORAL | Status: DC
  Administered 2020-05-17 – 2020-05-18 (×4): 420 mg via ORAL
  Filled 2020-05-17 (×5): qty 2

## 2020-05-17 MED ORDER — EPOETIN ALFA 10000 UNIT/ML IJ SOLN
10000.0000 [IU] | INTRAMUSCULAR | Status: DC
  Administered 2020-05-17 – 2020-05-19 (×2): 10000 [IU] via INTRAVENOUS
  Filled 2020-05-17: qty 1

## 2020-05-17 MED ORDER — VANCOMYCIN HCL IN DEXTROSE 1-5 GM/200ML-% IV SOLN
1000.0000 mg | INTRAVENOUS | Status: DC
  Administered 2020-05-17: 1000 mg via INTRAVENOUS
  Filled 2020-05-17: qty 200

## 2020-05-17 NOTE — Progress Notes (Signed)
[facility]  ROUNDING NOTE   Subjective:   Seen and examined on hemodialysis treatment. Tolerating treatment well. UF goal of 2 liters.     HEMODIALYSIS FLOWSHEET:  Blood Flow Rate (mL/min): 400 mL/min Arterial Pressure (mmHg): -190 mmHg Venous Pressure (mmHg): 150 mmHg Transmembrane Pressure (mmHg): 80 mmHg Ultrafiltration Rate (mL/min): 1210 mL/min Dialysate Flow Rate (mL/min): 600 ml/min Conductivity: Machine : 13.9 Conductivity: Machine : 13.9 Dialysis Fluid Bolus: Normal Saline Bolus Amount (mL): 200 mL    Objective:  Vital signs in last 24 hours:  Temp:  [97.5 F (36.4 C)-97.9 F (36.6 C)] 97.9 F (36.6 C) (01/24 1100) Pulse Rate:  [64-93] 81 (01/24 1200) Resp:  [0-25] 16 (01/24 1200) BP: (108-134)/(55-102) 109/88 (01/24 1200) SpO2:  [91 %-100 %] 96 % (01/24 1200) FiO2 (%):  [0 %-98 %] 98 % (01/24 0506)  Weight change:  Filed Weights   05/14/20 1257 05/15/20 0532  Weight: 120.2 kg 123 kg    Intake/Output: I/O last 3 completed shifts: In: [id number] [P.O.:120; I.V.:1050; IV Piggyback:300] Out: 200 [Blood:200]   Intake/Output this shift:  No intake/output data recorded.  Physical Exam: General: NAD, sitting up in bed  Head: Normocephalic, atraumatic. Moist oral mucosal membranes  Eyes: Anicteric, PERRL  Neck: Supple, trachea midline  Lungs:  Clear to auscultation  Heart: Regular rate and rhythm  Abdomen:  Soft, nontender,   Extremities:  trace peripheral edema.  Neurologic: Nonfocal, moving all four extremities  Skin: Erythema around AVG, tender   Access: Right IJ catheter,      Basic Metabolic Panel: Recent Labs  Lab 05/14/20 1317 05/14/20 2024 05/15/20 1051 05/16/20 0708 05/17/20 0636  NA 136  --  136 140 139  K 5.3*  --  7.1* 5.1 6.1*  CL 96*  --  99 97* 100  CO2 22  --  18* 26 21*  GLUCOSE 118*  --  95 95 156*  BUN 67*  --  77* 51* 73*  CREATININE 19.21* 16.73* 20.45* 15.63* 17.99*  CALCIUM 8.0*  --  7.6* 7.5* 7.3*  MG  --    --   --  2.1  --   PHOS  --   --   --  5.7*  --     Liver Function Tests: Recent Labs  Lab 05/14/20 1317 05/15/20 1051  AST 17 12*  ALT 16 15  ALKPHOS 60 61  BILITOT 0.8 0.8  PROT 8.2* 7.8  ALBUMIN 3.9 3.4*   No results for input(s): LIPASE, AMYLASE in the last 168 hours. No results for input(s): AMMONIA in the last 168 hours.  CBC: Recent Labs  Lab 05/14/20 1317 05/14/20 2024 05/15/20 1051 05/16/20 0708 05/17/20 0636  WBC 11.2* 9.7 8.4 6.2 9.2  NEUTROABS 8.2*  --   --   --   --   HGB 11.5* 10.1* 11.0* 9.7* 9.5*  HCT 35.9* 31.5* 33.8* 29.4* 29.3*  MCV 87.3 88.2 86.9 86.0 87.5  PLT 134* 112* 126* 123* 147*    Cardiac Enzymes: No results for input(s): CKTOTAL, CKMB, CKMBINDEX, TROPONINI in the last 168 hours.  BNP: Invalid input(s): POCBNP  CBG: No results for input(s): GLUCAP in the last 168 hours.  Microbiology: Results for orders placed or performed during the hospital encounter of 05/14/20  Blood culture (routine x 2)     Status: None (Preliminary result)   Collection Time: 05/14/20  1:17 PM   Specimen: BLOOD  Result Value Ref Range Status   Specimen Description BLOOD BLOOD RIGHT FOREARM  Final   Special Requests   Final    BOTTLES DRAWN AEROBIC AND ANAEROBIC Blood Culture adequate volume   Culture   Final    NO GROWTH 3 DAYS Performed at [facility], [address] [place], [place] [address]    Report Status PENDING  Incomplete  Blood culture (routine x 2)     Status: None (Preliminary result)   Collection Time: 05/14/20  1:17 PM   Specimen: BLOOD  Result Value Ref Range Status   Specimen Description BLOOD BLOOD RIGHT HAND  Final   Special Requests   Final    BOTTLES DRAWN AEROBIC AND ANAEROBIC Blood Culture adequate volume   Culture   Final    NO GROWTH 3 DAYS Performed at [facility], [address] [place], [place] [address]    Report Status PENDING  Incomplete  MRSA PCR Screening     Status: None   Collection Time:  05/14/20  8:08 PM   Specimen: Nasal Mucosa; Nasopharyngeal  Result Value Ref Range Status   MRSA by PCR NEGATIVE NEGATIVE Final    Comment:        The GeneXpert MRSA Assay (FDA approved for NASAL specimens only), is one component of a comprehensive MRSA colonization surveillance program. It is not intended to diagnose MRSA infection nor to guide or monitor treatment for MRSA infections. Performed at [facility], [address] [place], [place] [address]   SARS Coronavirus 2 by RT PCR (hospital order, performed in [facility] hospital lab) Nasopharyngeal Nasopharyngeal Swab     Status: None   Collection Time: 05/15/20  1:05 AM   Specimen: Nasopharyngeal Swab  Result Value Ref Range Status   SARS Coronavirus 2 NEGATIVE NEGATIVE Final    Comment: (NOTE) SARS-CoV-2 target nucleic acids are NOT DETECTED.  The SARS-CoV-2 RNA is generally detectable in upper and lower respiratory specimens during the acute phase of infection. The lowest concentration of SARS-CoV-2 viral copies this assay can detect is 250 copies / mL. A negative result does not preclude SARS-CoV-2 infection and should not be used as the sole basis for treatment or other patient management decisions.  A negative result may occur with improper specimen collection / handling, submission of specimen other than nasopharyngeal swab, presence of viral mutation(s) within the areas targeted by this assay, and inadequate number of viral copies (<250 copies / mL). A negative result must be combined with clinical observations, patient history, and epidemiological information.  Fact Sheet for Patients:   StrictlyIdeas.no  Fact Sheet for Healthcare Providers: BankingDealers.co.za  This test is not yet approved or  cleared by the [place] FDA and has been authorized for detection and/or diagnosis of SARS-CoV-2 by FDA under an Emergency Use Authorization (EUA).  This  EUA will remain in effect (meaning this test can be used) for the duration of the COVID-19 declaration under Section 564(b)(1) of the Act, 21 U.S.C. section 360bbb-3(b)(1), unless the authorization is terminated or revoked sooner.  Performed at [facility], [address] [place], [place] [address]   Aerobic/Anaerobic Culture (surgical/deep wound)     Status: None (Preliminary result)   Collection Time: 05/16/20 10:20 AM   Specimen: PATH Other; Abscess  Result Value Ref Range Status   Specimen Description   Final    ABSCESS RIGHT ELBOW Performed at [facility], [address], [place], [place] [address]    Special Requests   Final    NONE Performed at [facility], [address] [place], [place] [address]    Gram Stain   Final    RARE WBC PRESENT, PREDOMINANTLY PMN RARE GRAM POSITIVE COCCI IN PAIRS    Culture   Final    FEW STAPHYLOCOCCUS AUREUS CULTURE REINCUBATED FOR BETTER GROWTH SUSCEPTIBILITIES TO FOLLOW Performed at [facility], [place] [address], [place], [place] [address]    Report Status PENDING  Incomplete  Aerobic/Anaerobic Culture (surgical/deep wound)     Status: None (Preliminary result)   Collection Time: 05/16/20 11:41 AM   Specimen: PATH Other  Result Value Ref Range Status   Specimen Description   Final    GRAFT RIGHT Performed at [facility], [address] [place], [place] [address]    Special Requests   Final    NONE Performed at [facility], [address] [place], [place] [address]    Gram Stain   Final    RARE WBC PRESENT, PREDOMINANTLY PMN RARE GRAM POSITIVE COCCI IN PAIRS    Culture   Final    MODERATE STAPHYLOCOCCUS AUREUS SUSCEPTIBILITIES TO FOLLOW Performed at [facility], [place] [address] [place], [place] [address]    Report Status PENDING  Incomplete    Coagulation Studies: Recent Labs    05/14/20 1358  LABPROT 14.8  INR 1.2    Urinalysis: No results for  input(s): COLORURINE, LABSPEC, PHURINE, GLUCOSEU, HGBUR, BILIRUBINUR, KETONESUR, PROTEINUR, UROBILINOGEN, NITRITE, LEUKOCYTESUR in the last 72 hours.  Invalid input(s): APPERANCEUR    Imaging: No results found.   Medications:   . sodium chloride 0 mL/hr at 05/16/20 0123   . Chlorhexidine Gluconate Cloth  6 each Topical Daily  . epoetin (EPOGEN/PROCRIT) injection  10,000 Units Intravenous Q M,W,F-HD  . heparin  5,000 Units Subcutaneous Q8H  . HYDROmorphone   Intravenous Q4H  . pantoprazole  40 mg Oral QPM  . sodium zirconium cyclosilicate  10 g Oral BID  . vancomycin variable dose per unstable renal function (pharmacist dosing)   Does not apply See admin instructions  . Vitamin D (Ergocalciferol)  50,000 Units Oral Q7 days   sodium chloride, acetaminophen **OR** acetaminophen, albuterol, alum & mag hydroxide-simeth, diphenhydrAMINE **OR** diphenhydrAMINE, naloxone **AND** sodium chloride flush, promethazine, promethazine  Assessment/ Plan:  [name] is a 45 y.o.  male with ESRD on HD, GERD, guillain barre, previous CIVD infection, HTN, CHF, depression who was admitted on 05/14/2020 for Cellulitis [L03.90] Abscess of arm, right [L02.413] Status post excision of his right forearm AVG by Dr. Bridgett Larsson on 05/17/2019  [facility] [place]   1. ESRD with hyperkalemia Seen and examined on hemodialysis treatment.  With complication of dialysis device. AVG resection. Now with RIJ temp HD catheter - MWF while inpatient.  - lokelma  2. Anemia of CKD  - EPO with HD treatment.   3. Secondary Hyperparathyroidism:  Not currently on binders - restart Auryxia with meals.   4. Hypertension: currently not on any blood pressure agents.   LOS: 3 Kemora Pinard 1/24/202212:23 PM

## 2020-05-17 NOTE — Consult Note (Signed)
NAME: Patrick Downs  DOB: Jul 06, 1975  MRN: [id number]  Date/Time: 05/17/2020 3:48 PM  REQUESTING PROVIDER: Dr. Dwyane Dee Subjective:  REASON FOR CONSULT: AV graft infection ? Patrick Downs is a 45 y.o. male with a history of end-stage renal disease, multiple  access placement failure hypertension, treated MRSA infection of the right foot, status post bilateral fifth toe amputation, chronic hypotension on midodrine, Guillain-Barr syndrome, history of Covid with Covid toes Patient underwent right forearm loop AV graft on 04/08/2020, presented to the ED on 05/14/2020 with pus oozing from the site of the infection. He did not have any fever or chills. He had call vascular office and they asked him to go to the ED. Patient says the site was not accessed. He has a catheter through which he is getting dialysis. In the ED BP 90/40, temperature 99.3, heart rate 97, sats 95%. Labs revealed WBC of 11.2, hemoglobin 11.5, platelet 134. Lactate was 1.5. Sodium 136, potassium 5.3, glucose 118, creatinine 19 and BUN 67. He had missed 1 day of dialysis. Blood cultures were sent. He was started on ceftriaxone and vancomycin. He was seen by vascular and as there was frank pus draining from the antecubital incision he was taken for excision of the right forearm AV graft on 05/16/2020. Culture of the infected graft was staph aureus. Susceptibility still pending. I am asked to see the patient for the same. Patient says he is feeling better. He has no other complaints.  Past medical history ESRD Peripheral neuropathy Secondary hyperparathyroidism Hypotension Guillain-Barr syndrome Gout  Past surgical history Right and left fifth toe amputation AV fistula Parathyroidectomy Renal biopsy Tonsillectomy Tracheotomy   Past Surgical History:  Procedure Laterality Date  . A/V FISTULAGRAM Right 01/07/2018   Procedure: A/V FISTULAGRAM;  Surgeon: Algernon Huxley, MD;  Location: [facility];  Service:  Cardiovascular;  Laterality: Right;  . AMPUTATION TOE Right 06/08/2017   Procedure: AMPUTATION TOE RIGHT FIFTH TOE;  Surgeon: Samara Deist, DPM;  Location: [facility];  Service: Podiatry;  Laterality: Right;  . AMPUTATION TOE Left 05/17/2018   Procedure: RAY LEFT 5TH;  Surgeon: Samara Deist, DPM;  Location: [facility];  Service: Podiatry;  Laterality: Left;  . AMPUTATION TOE Right 03/05/2020   Procedure: AMPUTATION TOE IPJ X 2 RIGHT GREAT AND 3RD;  Surgeon: Samara Deist, DPM;  Location: [facility];  Service: Podiatry;  Laterality: Right;  . AV FISTULA PLACEMENT     x5      2 graphs  . AV FISTULA PLACEMENT Right 04/08/2020   Procedure: INSERTION OF ARTERIOVENOUS (AV) GORE-TEX GRAFT ARM ( ARTEGRAFT);  Surgeon: Algernon Huxley, MD;  Location: [facility];  Service: Vascular;  Laterality: Right;  . DIALYSIS/PERMA CATHETER INSERTION N/A 01/20/2019   Procedure: DIALYSIS/PERMA CATHETER INSERTION;  Surgeon: Algernon Huxley, MD;  Location: [facility];  Service: Cardiovascular;  Laterality: N/A;  . DIALYSIS/PERMA CATHETER REMOVAL N/A 06/17/2018   Procedure: DIALYSIS/PERMA CATHETER REMOVAL;  Surgeon: Algernon Huxley, MD;  Location: [facility];  Service: Cardiovascular;  Laterality: N/A;  . PARATHYROIDECTOMY    . PERIPHERAL VASCULAR THROMBECTOMY Right 01/22/2019   Procedure: PERIPHERAL VASCULAR THROMBECTOMY;  Surgeon: Algernon Huxley, MD;  Location: [facility];  Service: Cardiovascular;  Laterality: Right;  . REMOVAL OF GRAFT Right 05/16/2020   Procedure: REMOVAL OF GRAFT;  Surgeon: Conrad [place] Beach, MD;  Location: [facility];  Service: Vascular;  Laterality: Right;  . RENAL BIOPSY    . REVISON OF ARTERIOVENOUS  FISTULA Right 02/07/2018   Procedure: REVISON OF ARTERIOVENOUS FISTULA;  Surgeon: Algernon Huxley, MD;  Location: [facility];  Service: Vascular;  Laterality: Right;  . TEE WITHOUT CARDIOVERSION N/A 01/22/2018   Procedure: TRANSESOPHAGEAL ECHOCARDIOGRAM (TEE);  Surgeon: Dionisio David, MD;   Location: [facility];  Service: Cardiovascular;  Laterality: N/A;  . tonsiilectomy    . TONSILLECTOMY    . tracheotomy    . UPPER EXTREMITY VENOGRAPHY Bilateral 02/23/2020   Procedure: UPPER EXTREMITY VENOGRAPHY;  Surgeon: Algernon Huxley, MD;  Location: [facility];  Service: Cardiovascular;  Laterality: Bilateral;    Social History   Socioeconomic History  . Marital status: Single    Spouse name: Not on file  . Number of children: Not on file  . Years of education: Not on file  . Highest education level: Not on file  Occupational History  . Not on file  Tobacco Use  . Smoking status: Never Smoker  . Smokeless tobacco: Never Used  Vaping Use  . Vaping Use: Never used  Substance and Sexual Activity  . Alcohol use: No  . Drug use: Yes    Types: Marijuana  . Sexual activity: Yes  Other Topics Concern  . Not on file  Social History Narrative  . Not on file   Social Determinants of Health   Financial Resource Strain: Not on file  Food Insecurity: Not on file  Transportation Needs: Not on file  Physical Activity: Not on file  Stress: Not on file  Social Connections: Not on file  Intimate Partner Violence: Not on file    Family History  Problem Relation Age of Onset  . Diabetes Mellitus II Father   . Kidney disease Father   . Kidney failure Paternal Grandfather   . Prostate cancer Neg Hx   . Kidney cancer Neg Hx   . Bladder Cancer Neg Hx    Allergies  Allergen Reactions  . Hepatitis B Vaccine     Other reaction(s): Unknown  . Ondansetron Other (See Comments)    Stomach pain   . Minoxidil Other (See Comments)    "put fluid around my heart", PERICARDIAL EFFUSION  . Morphine And Related Other (See Comments)    Aggressive   . Omnipaque [Iohexol] Itching and Other (See Comments)    Rigors on one occasion, widespread itching on a separate occasion (resolved with Benadryl), tremors    ? Current Facility-Administered Medications  Medication Dose Route Frequency  Provider Last Rate Last Admin  . 0.9 %  sodium chloride infusion   Intravenous PRN Val Riles, MD 0 mL/hr at 05/16/20 0123 Restarted at 05/16/20 1138  . acetaminophen (TYLENOL) tablet 650 mg  650 mg Oral Q6H PRN Clance Boll, MD   650 mg at 05/15/20 0251   Or  . acetaminophen (TYLENOL) suppository 650 mg  650 mg Rectal Q6H PRN Myles Rosenthal A, MD      . albuterol (PROVENTIL) (2.5 MG/3ML) 0.083% nebulizer solution 2.5 mg  2.5 mg Nebulization Q2H PRN Clance Boll, MD      . alum & mag hydroxide-simeth (MAALOX/MYLANTA) 200-200-20 MG/5ML suspension 30 mL  30 mL Oral Q6H PRN Val Riles, MD   30 mL at 05/15/20 1008  . Chlorhexidine Gluconate Cloth 2 % PADS 6 each  6 each Topical Daily Myles Rosenthal A, MD      . diphenhydrAMINE (BENADRYL) injection 12.5 mg  12.5 mg Intravenous Q6H PRN Conrad Imbler, MD   12.5 mg at 05/16/20 2240  Or  . diphenhydrAMINE (BENADRYL) 12.5 MG/5ML elixir 12.5 mg  12.5 mg Oral Q6H PRN Conrad Pajonal, MD      . epoetin alfa (EPOGEN) injection 10,000 Units  10,000 Units Intravenous Q M,W,F-HD Lavonia Dana, MD   10,000 Units at 05/17/20 1222  . ferric citrate (AURYXIA) tablet 420 mg  420 mg Oral TID WC Kolluru, Sarath, MD      . heparin injection 5,000 Units  5,000 Units Subcutaneous Q8H Clance Boll, MD   5,000 Units at 05/17/20 0504  . HYDROmorphone (DILAUDID) 1 mg/mL PCA injection   Intravenous Q4H Conrad Havelock, MD   25 mg at 05/16/20 1606  . naloxone [facility]) injection 0.4 mg  0.4 mg Intravenous PRN Conrad Val[place], MD       And  . sodium chloride flush (NS) 0.9 % injection 9 mL  9 mL Intravenous PRN Conrad Gross, MD      . pantoprazole (PROTONIX) EC tablet 40 mg  40 mg Oral QPM Val Riles, MD   40 mg at 05/16/20 1821  . promethazine (PHENERGAN) injection 25 mg  25 mg Intravenous Q6H PRN Conrad , MD      . promethazine (PHENERGAN) tablet 12.5 mg  12.5 mg Oral Q6H PRN Myles Rosenthal A, MD      . sodium zirconium cyclosilicate  (LOKELMA) packet 10 g  10 g Oral BID Tedrow, Madison P, PA-C   10 g at 05/15/20 2050  . vancomycin (VANCOCIN) IVPB 1000 mg/200 mL premix  1,000 mg Intravenous Q M,W,F-HD Val Riles, MD      . Vitamin D (Ergocalciferol) (DRISDOL) capsule 50,000 Units  50,000 Units Oral Q7 days Val Riles, MD   50,000 Units at 05/16/20 1515     Abtx:  Anti-infectives (From admission, onward)   Start     Dose/Rate Route Frequency Ordered Stop   05/17/20 1600  vancomycin (VANCOCIN) IVPB 1000 mg/200 mL premix        1,000 mg 200 mL/hr over 60 Minutes Intravenous Every M-W-F (Hemodialysis) 05/17/20 1508     05/15/20 2000  vancomycin (VANCOCIN) IVPB 1000 mg/200 mL premix        1,000 mg 200 mL/hr over 60 Minutes Intravenous  Once 05/15/20 1459 05/16/20 0038   05/15/20 1028  vancomycin variable dose per unstable renal function (pharmacist dosing)  Status:  Discontinued         Does not apply See admin instructions 05/15/20 1028 05/17/20 1508   05/14/20 1345  vancomycin (VANCOCIN) IVPB 1000 mg/200 mL premix       "Followed by" Linked Group Details   1,000 mg 200 mL/hr over 60 Minutes Intravenous  Once 05/14/20 1344 05/14/20 1745   05/14/20 1345  vancomycin (VANCOREADY) IVPB 1500 mg/300 mL       "Followed by" Linked Group Details   1,500 mg 150 mL/hr over 120 Minutes Intravenous  Once 05/14/20 1344 05/14/20 1641   05/14/20 1330  vancomycin (VANCOCIN) IVPB 1000 mg/200 mL premix  Status:  Discontinued        1,000 mg 200 mL/hr over 60 Minutes Intravenous  Once 05/14/20 1325 05/14/20 1344   05/14/20 1330  cefTRIAXone (ROCEPHIN) 2 g in sodium chloride 0.9 % 100 mL IVPB        2 g 200 mL/hr over 30 Minutes Intravenous  Once 05/14/20 1325 05/14/20 1441      REVIEW OF SYSTEMS:  Const: negative fever, negative chills, negative weight loss Eyes: negative diplopia or visual changes, negative  eye pain ENT: negative coryza, negative sore throat Resp: negative cough, hemoptysis, dyspnea Cards: negative for chest  pain, palpitations, lower extremity edema GU: negative for frequency, dysuria and hematuria GI: Negative for abdominal pain, diarrhea, bleeding, constipation Skin: negative for rash and pruritus Heme: negative for easy bruising and gum/nose bleeding MS: negative for myalgias, arthralgias, back pain and muscle weakness Neurolo:wasting and weakness of hand muscles Psych: negative for feelings of anxiety, depression  Endocrine: negative for thyroid, diabetes Allergy/Immunology- as above? : Objective:  VITALS:  BP 106/78 (BP Location: Left Arm)   Pulse 78   Temp 97.9 F (36.6 C) (Oral)   Resp 17   Ht '6\' 2"'$  (1.88 m)   Wt 123 kg   SpO2 96%   BMI 34.82 kg/m  PHYSICAL EXAM:  General: Alert, cooperative, no distress, appears stated age.  Head: Normocephalic, without obvious abnormality, atraumatic. Eyes: Conjunctivae clear, anicteric sclerae. Pupils are equal ENT Nares normal. No drainage or sinus tenderness. Lips, mucosa, and tongue normal. No Thrush Neck: Supple, symmetrical, no adenopathy, thyroid: non tender Rt subclavian catheter no carotid bruit and no JVD. Back: No CVA tenderness. Lungs: Clear to auscultation bilaterally. No Wheezing or Rhonchi. No rales. Heart: Regular rate and rhythm, no murmur, rub or gallop. Abdomen: Soft, non-tender,not distended. Bowel sounds normal. No masses Extremities: Bilateral fifth toe amputation Partial amputation of the right great toe rt arm dressing not removed Skin: No rashes or lesions. Or bruising Lymph: Cervical, supraclavicular normal. Neurologic:claw fingers, interosseous muscles wasting Pertinent Labs Lab Results CBC    Component Value Date/Time   WBC 9.2 05/17/2020 0636   RBC 3.35 (L) 05/17/2020 0636   HGB 9.5 (L) 05/17/2020 0636   HGB 7.7 (L) 08/22/2014 0653   HCT 29.3 (L) 05/17/2020 0636   HCT 23.9 (L) 08/22/2014 0653   PLT 147 (L) 05/17/2020 0636   PLT 195 08/22/2014 0653   MCV 87.5 05/17/2020 0636   MCV 92 08/22/2014  0653   MCH 28.4 05/17/2020 0636   MCHC 32.4 05/17/2020 0636   RDW 16.6 (H) 05/17/2020 0636   RDW 17.3 (H) 08/22/2014 0653   LYMPHSABS 1.3 05/14/2020 1317   LYMPHSABS 0.9 (L) 08/22/2014 0653   MONOABS 1.1 (H) 05/14/2020 1317   MONOABS 1.6 (H) 08/22/2014 0653   EOSABS 0.4 05/14/2020 1317   EOSABS 0.0 08/22/2014 0653   BASOSABS 0.0 05/14/2020 1317   BASOSABS 0.0 08/22/2014 0653    CMP Latest Ref Rng & Units 05/17/2020 05/16/2020 05/15/2020  Glucose 70 - 99 mg/dL 156(H) 95 95  BUN 6 - 20 mg/dL 73(H) 51(H) 77(H)  Creatinine 0.61 - 1.24 mg/dL 17.99(H) 15.63(H) 20.45(H)  Sodium 135 - 145 mmol/L 139 140 136  Potassium 3.5 - 5.1 mmol/L 6.1(H) 5.1 7.1(HH)  Chloride 98 - 111 mmol/L 100 97(L) 99  CO2 22 - 32 mmol/L 21(L) 26 18(L)  Calcium 8.9 - 10.3 mg/dL 7.3(L) 7.5(L) 7.6(L)  Total Protein 6.5 - 8.1 g/dL - - 7.8  Total Bilirubin 0.3 - 1.2 mg/dL - - 0.8  Alkaline Phos 38 - 126 U/L - - 61  AST 15 - 41 U/L - - 12(L)  ALT 0 - 44 U/L - - 15      Microbiology: Recent Results (from the past 240 hour(s))  Blood culture (routine x 2)     Status: None (Preliminary result)   Collection Time: 05/14/20  1:17 PM   Specimen: BLOOD  Result Value Ref Range Status   Specimen Description BLOOD BLOOD RIGHT FOREARM  Final  Special Requests   Final    BOTTLES DRAWN AEROBIC AND ANAEROBIC Blood Culture adequate volume   Culture   Final    NO GROWTH 3 DAYS Performed at [facility], [address] [place], [place] [address]    Report Status PENDING  Incomplete  Blood culture (routine x 2)     Status: None (Preliminary result)   Collection Time: 05/14/20  1:17 PM   Specimen: BLOOD  Result Value Ref Range Status   Specimen Description BLOOD BLOOD RIGHT HAND  Final   Special Requests   Final    BOTTLES DRAWN AEROBIC AND ANAEROBIC Blood Culture adequate volume   Culture   Final    NO GROWTH 3 DAYS Performed at [facility], [address] [place], [place] [address]    Report  Status PENDING  Incomplete  MRSA PCR Screening     Status: None   Collection Time: 05/14/20  8:08 PM   Specimen: Nasal Mucosa; Nasopharyngeal  Result Value Ref Range Status   MRSA by PCR NEGATIVE NEGATIVE Final    Comment:        The GeneXpert MRSA Assay (FDA approved for NASAL specimens only), is one component of a comprehensive MRSA colonization surveillance program. It is not intended to diagnose MRSA infection nor to guide or monitor treatment for MRSA infections. Performed at [facility], [address] [place], [place] [address]   SARS Coronavirus 2 by RT PCR (hospital order, performed in [facility] hospital lab) Nasopharyngeal Nasopharyngeal Swab     Status: None   Collection Time: 05/15/20  1:05 AM   Specimen: Nasopharyngeal Swab  Result Value Ref Range Status   SARS Coronavirus 2 NEGATIVE NEGATIVE Final    Comment: (NOTE) SARS-CoV-2 target nucleic acids are NOT DETECTED.  The SARS-CoV-2 RNA is generally detectable in upper and lower respiratory specimens during the acute phase of infection. The lowest concentration of SARS-CoV-2 viral copies this assay can detect is 250 copies / mL. A negative result does not preclude SARS-CoV-2 infection and should not be used as the sole basis for treatment or other patient management decisions.  A negative result may occur with improper specimen collection / handling, submission of specimen other than nasopharyngeal swab, presence of viral mutation(s) within the areas targeted by this assay, and inadequate number of viral copies (<250 copies / mL). A negative result must be combined with clinical observations, patient history, and epidemiological information.  Fact Sheet for Patients:   StrictlyIdeas.no  Fact Sheet for Healthcare Providers: BankingDealers.co.za  This test is not yet approved or  cleared by the [place] FDA and has been authorized for detection  and/or diagnosis of SARS-CoV-2 by FDA under an Emergency Use Authorization (EUA).  This EUA will remain in effect (meaning this test can be used) for the duration of the COVID-19 declaration under Section 564(b)(1) of the Act, 21 U.S.C. section 360bbb-3(b)(1), unless the authorization is terminated or revoked sooner.  Performed at [facility], [address] [place],  [address]   Aerobic/Anaerobic Culture (surgical/deep wound)     Status: None (Preliminary result)   Collection Time: 05/16/20 10:20 AM   Specimen: PATH Other; Abscess  Result Value Ref Range Status   Specimen Description   Final    ABSCESS RIGHT ELBOW Performed at [facility], [address] [place],  [address]    Special Requests   Final    NONE Performed at [facility], [place],  [place] [address]    Gram Stain   Final    RARE WBC PRESENT, PREDOMINANTLY PMN RARE GRAM POSITIVE COCCI IN PAIRS    Culture   Final    FEW STAPHYLOCOCCUS AUREUS CULTURE REINCUBATED FOR BETTER GROWTH SUSCEPTIBILITIES TO FOLLOW Performed at [facility], [place] [address], [place], [place] [address]    Report Status PENDING  Incomplete  Aerobic/Anaerobic Culture (surgical/deep wound)     Status: None (Preliminary result)   Collection Time: 05/16/20 11:41 AM   Specimen: PATH Other  Result Value Ref Range Status   Specimen Description   Final    GRAFT RIGHT Performed at [facility], [address], [place], [place] [address]    Special Requests   Final    NONE Performed at [facility], [address] [place], [place] [address]    Gram Stain   Final    RARE WBC PRESENT, PREDOMINANTLY PMN RARE GRAM POSITIVE COCCI IN PAIRS    Culture   Final    MODERATE STAPHYLOCOCCUS AUREUS SUSCEPTIBILITIES TO FOLLOW Performed at [facility], [place] [address] [place], [place] [address]    Report Status PENDING  Incomplete    IMAGING RESULTS: I  have personally reviewed the films ? Impression/Recommendation ? ?Infected AV graft of right extremity.  Status post removal. Staph aureus and culture Currently on vancomycin Once susceptibilities available if MSSA we can switch to cefazolin. Will likely need 4 weeks of IV antibiotics. Blood culture negative  End-stage renal disease.  Multiple failed access  History of Guillain-Barr syndrome  Anemia secondary to ESRD.  Hypertension.  Patient of late has hypotension with as needed use of midodrine. ? ___________________________________________________ Discussed with patient, requesting provider Note:  This document was prepared using Dragon voice recognition software and may include unintentional dictation errors.

## 2020-05-17 NOTE — Care Management Important Message (Signed)
Important Message  Patient Details  Name: SHLOME GARRIDO MRN: [id number] Date of Birth: [birth year]   Medicare Important Message Given:  Yes     Dannette Barbara 05/17/2020, 12:20 PM

## 2020-05-17 NOTE — Progress Notes (Signed)
[facility] Progress Note  Patient: Patrick Downs    [id number]  DOA: 05/14/2020     Date of Service: the patient was seen and examined on 05/17/2020  Chief Complaint  Patient presents with  . Wound Infection   Brief hospital course:  IVIS BERENDS is a 45 y.o. 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 He called vascular clinic and he was referred to ED.  He notes no fever/chills/ n/v/d/sob/chest pain, abdominal pain , cough or sore throat. He states he is at his baseline line health other than noted infection of his right arm AVF graft. On evaluation in ed U/s noted no clots, but noted inflammation consistent with cellulitis and could not rule out phlegmon. Patient is admitted for iv antibiotics and vascular follow up. ED Course:  Temp:99.3, BP 90/40-111/70, hr 97, rr17 , sat 95% on ra  Labs: wbc 11.2, hgb 11.5 down from 13.6, but noted baseline around 12. plt 134 NA 136, K5.3, cl 96,  Glu 118,bun 67/ cr19.21, gfr3 AG18  Lactic 1.5 Cxr:NAD      Assessment and Plan:  AV graft infection, RUE Patient presented with complicated soft tissue infection of new Right upper extremity AVF graft site, purulent cellulitis / cannot r/o phlegmon  -Continue broad spectrum anti-biotics pre protocol  1/21 blood cultures negative 1/23 Wound culture from the OR growing staph aureus, sensitivity pending Vascular surgery consulted, s/p excision of AV graft and intraoperative cultures sent  ID consulted for antibiotics and duration of treatment   Anemia -drop in h/h' -with hx of bleeding from AVF site -cycle h/h   ESRD  -HD T/T/S -missed HD  session on thurz -Nephrology consulted -patient has associated hyperkalemia mild 5.3  -repeat labs , treat base on trend  -ekg nsr, no hyperacute t-waves  1/22 K 7.1 Lokelma was given by nephrology, patient needs hemodialysis, nephrology aware 1/23 K 5.1 1/24 K 6.1, continue Lokelma, patient will get hemodialysis today    GERD, started pantoprazole 40 mg twice daily  Chronic pain  - Guillain Barre remote with sequella of b/l foot drop -continue home regimen    hx of COVID 19 infection - s/p recovery - patient remains unvaccinated  HTN -prior hx but now of medications  -pt notes intermittent rare hypotension with prn use of midodrine -note use of this med is rare  CHF  -diastolic no active issues   Depression No active issue currently not medications  Body mass index is 34.82 kg/m.    Interventions:        Diet: NPO for possible surgical intervention DVT Prophylaxis: SCD, pharmacological prophylaxis contraindicated due to For possible surgical intervention   Advance goals of care discussion: Full code  Family Communication: family was not present at bedside, at the time of interview.  The pt provided permission to discuss medical plan with the family. Opportunity was given to ask question and all questions were answered satisfactorily.   Disposition:  Pt is from home, admitted with RUE AV graft infection and cellulitis, s/p excision of AV graft done by vascular surgery, wound culture growing staph aureus sensitivity pending, ID consulted for antibiotics and duration of treatment.   Continue dressing change,  vascular surgery recommended to follow as an outpatient, most likely discharge tomorrow a.m.   Subjective: No significant overnight events, patient is still complaining of pain in the right upper extremity after the excision.  Dressing CDI. Patient denied any other active issues.   Physical Exam: General:  alert oriented to time, place, and person.   Appear in mild distress, affect appropriate Eyes: PERRLA ENT: Oral Mucosa Clear, moist  Neck: no JVD,  Cardiovascular: S1 and S2 Present, no Murmur,  Respiratory: good respiratory effort, Bilateral Air entry equal and Decreased, no Crackles, no wheezes Abdomen: Bowel Sound present, Soft and no tenderness,  Skin: no rashes Extremities: no Pedal edema, no calf tenderness. RUE s/p AVG excision, dressing CDI Neurologic: without any new focal findings Gait not checked due to patient safety concerns  Vitals:   05/17/20 1230 05/17/20 1245 05/17/20 1300 05/17/20 1344  BP: 120/83 108/88 106/78   Pulse: 83 79 78   Resp: 19 (!) '27 16 17  '$ Temp:      TempSrc:      SpO2: (!) 89% 94% 96% 96%  Weight:      Height:        Intake/Output Summary (Last 24 hours) at 05/17/2020 1532 Last data filed at 05/17/2020 1300 Gross per 24 hour  Intake 292.3 ml  Output 1800 ml  Net -1507.7 ml   Filed Weights   05/14/20 1257 05/15/20 0532  Weight: 120.2 kg 123 kg    Data Reviewed: I have personally reviewed and interpreted daily labs, tele strips, imagings as discussed above. I reviewed all nursing notes, pharmacy notes, vitals, pertinent old records I have discussed plan of care as described above with RN and patient/family.  CBC: Recent Labs  Lab 05/14/20 1317 05/14/20 2024 05/15/20 1051 05/16/20 0708 05/17/20 0636  WBC 11.2* 9.7 8.4 6.2 9.2  NEUTROABS 8.2*  --   --   --   --   HGB 11.5* 10.1* 11.0* 9.7* 9.5*  HCT 35.9* 31.5* 33.8* 29.4* 29.3*  MCV 87.3 88.2 86.9 86.0 87.5  PLT 134* 112* 126* 123* Q000111Q*   Basic Metabolic Panel: Recent Labs  Lab 05/14/20 1317 05/14/20 2024 05/15/20 1051 05/16/20 0708 05/17/20 0636  NA 136  --  136 140 139  K 5.3*  --  7.1* 5.1 6.1*  CL 96*  --  99 97* 100  CO2 22  --  18* 26 21*  GLUCOSE 118*  --  95 95 156*  BUN 67*  --  77* 51* 73*  CREATININE 19.21* 16.73* 20.45* 15.63* 17.99*  CALCIUM 8.0*  --  7.6* 7.5* 7.3*  MG  --   --   --  2.1  --    PHOS  --   --   --  5.7*  --     Studies: No results found.  Scheduled Meds: . Chlorhexidine Gluconate Cloth  6 each Topical Daily  . epoetin (EPOGEN/PROCRIT) injection  10,000 Units Intravenous Q M,W,F-HD  . ferric citrate  420 mg Oral TID WC  . heparin  5,000 Units Subcutaneous Q8H  . HYDROmorphone   Intravenous Q4H  . pantoprazole  40 mg Oral QPM  . sodium zirconium cyclosilicate  10 g Oral BID  . Vitamin D (Ergocalciferol)  50,000 Units Oral Q7 days   Continuous Infusions: . sodium chloride 0 mL/hr at 05/16/20 0123  . vancomycin     PRN Meds: sodium chloride, acetaminophen **OR** acetaminophen, albuterol, alum & mag hydroxide-simeth, diphenhydrAMINE **OR** diphenhydrAMINE, naloxone **AND** sodium chloride flush,  promethazine, promethazine  Time spent: 35 minutes  Author: Val Riles. MD [facility] 05/17/2020 3:32 PM  To reach On-call, see care teams to locate the attending and reach out to them via [email or web address]. If 7PM-7AM, please contact night-coverage If you still have difficulty reaching the attending provider, please page the [facility] (Director on Call) for [facility] on amion for assistance.

## 2020-05-17 NOTE — Progress Notes (Signed)
[facility] Daily Progress Note   Subjective: 05/16/20 Excision of right forearm arteriovenous graft  No issues overnight. Patient with continued incisional pain to the right upper extremity.  Objective: Vitals:   05/17/20 0506 05/17/20 0538 05/17/20 0729 05/17/20 0826  BP:  (!) 129/55  (!) 132/94  Pulse:  78  71  Resp: '14 18 16 18  '$ Temp:  97.6 F (36.4 C)  (!) 97.5 F (36.4 C)  TempSrc:  Oral  Oral  SpO2: 95% 100% 95% 91%  Weight:      Height:        Intake/Output Summary (Last 24 hours) at 05/17/2020 1112 Last data filed at 05/17/2020 [id number] Gross per 24 hour  Intake 1042.3 ml  Output 200 ml  Net 842.3 ml   Physical Exam: A&Ox3, NAD CV: RRR Pulmonary: CTA Bilaterally Abdomen: Soft, Nontender, Nondistended Vascular:  Right upper extremity: Our dressing removed. Penrose is intact. Minimal drainage. Staples are clean dry and intact. Extremity is soft. Extremities warm distally to fingers. Motor/sensory is intact.   Laboratory: CBC    Component Value Date/Time   WBC 9.2 05/17/2020 0636   HGB 9.5 (L) 05/17/2020 0636   HGB 7.7 (L) 08/22/2014 0653   HCT 29.3 (L) 05/17/2020 0636   HCT 23.9 (L) 08/22/2014 0653   PLT 147 (L) 05/17/2020 0636   PLT 195 08/22/2014 0653   BMET    Component Value Date/Time   NA 139 05/17/2020 0636   NA 136 08/22/2014 0653   K 6.1 (H) 05/17/2020 0636   K 5.1 08/22/2014 0653   CL 100 05/17/2020 0636   CL 95 (L) 08/22/2014 0653   CO2 21 (L) 05/17/2020 0636   CO2 27 08/22/2014 0653   GLUCOSE 156 (H) 05/17/2020 0636   GLUCOSE 176 (H) 08/22/2014 0653   BUN 73 (H) 05/17/2020 0636   BUN 71 (H) 08/22/2014 0653   CREATININE 17.99 (H) 05/17/2020 0636   CREATININE 8.87 (H) 08/22/2014 0653   CALCIUM 7.3 (L) 05/17/2020 0636   CALCIUM 7.1 (L) 08/22/2014 0653   GFRNONAA 3 (L) 05/17/2020 0636   GFRNONAA 7 (L) 08/22/2014 0653   GFRAA 4 (L) 01/27/2020 0511   GFRAA 8 (L) 08/22/2014 0653   Assessment/Planning: Patrick Downs is a 45 y.o. male who presents with infected right forearm arteriovenous graft constructed with Artegraft s/p excision - POD#1  1) WBC in normal range 2) Hemoglobin stable 3) No further plan for surgery during this inpatient stay. Depending on drainage, may pull Penrose drain tomorrow. We will plan on bringing the patient back to the operating room for revision in the near future possibly a week or 2 to allow the area to drain and heal. 4) Patient should continue to dialyze through his PermCath 5) we will consult social work in regard to visiting nurse services for wound care. Face-to-face complete. Daily dressing orders placed.  Discussed with Dr. Ellis Parents Issaiah Seabrooks PA-C 05/17/2020 11:12 AM

## 2020-05-17 NOTE — Consult Note (Signed)
Pharmacy Antibiotic Note  Patrick Downs is a 45 y.o. male admitted on 05/14/2020 with cellulitis draining pus from dialysis graft.  Pharmacy has been consulted for Vancomycin dosing.  Also received CTX 2g x1 in ED  Plan: Patient loaded with 2.5g Vancomycin total (1g in ED; followed by 1.5g).  **Pt is ESRD-HD dependent with schedule PTA TTS. Last session Tuesday (1/18), missed Thursday (1/20) d/t transportation issue. Current infection appears to be dialysis graft site.  1/24- patient getting dialysis today and per Nephrology note will continue MWF while inpatient.  Will order Vancomycin '1000mg'$  IV w/ Hemodialysis MWF.  (Wt > 80 kg). ID consult. F/u wound cx. Anticipate checking trough on Wed 1/26.  Goal trough 15-25 mcg/ml    Height: '6\' 2"'$  (188 cm) Weight: 123 kg (271 lb 2.7 oz) IBW/kg (Calculated) : 82.2  Temp (24hrs), Avg:97.7 F (36.5 C), Min:97.5 F (36.4 C), Max:97.9 F (36.6 C)  Recent Labs  Lab 05/14/20 1317 05/14/20 2024 05/15/20 1051 05/16/20 0708 05/17/20 0636  WBC 11.2* 9.7 8.4 6.2 9.2  CREATININE 19.21* 16.73* 20.45* 15.63* 17.99*  LATICACIDVEN 1.5  --   --   --   --     Estimated Creatinine Clearance: 7.4 mL/min (A) (by C-G formula based on SCr of 17.99 mg/dL (H)).    Allergies  Allergen Reactions  . Hepatitis B Vaccine     Other reaction(s): Unknown  . Ondansetron Other (See Comments)    Stomach pain   . Minoxidil Other (See Comments)    "put fluid around my heart", PERICARDIAL EFFUSION  . Morphine And Related Other (See Comments)    Aggressive   . Omnipaque [Iohexol] Itching and Other (See Comments)    Rigors on one occasion, widespread itching on a separate occasion (resolved with Benadryl), tremors    Antimicrobials this admission: Vancomycin (1/21 >>  Ceftriaxone 1/21 x1  Dose adjustments this admission: ESRD-HD (TTS PTA) - no current adjustments. Will f/u IP HD schedule for further dosing.  Microbiology results: 1/21 BCx: NG  x3d 1/23Wound right graft/abscess; Staph aureus   Thank you for allowing pharmacy to be a part of this patient's care.  Patrick Downs A 05/17/2020 3:09 PM

## 2020-05-18 LAB — BASIC METABOLIC PANEL
Anion gap: 20 — ABNORMAL HIGH (ref 5–15)
BUN: 75 mg/dL — ABNORMAL HIGH (ref 6–20)
CO2: 24 mmol/L (ref 22–32)
Calcium: 7.2 mg/dL — ABNORMAL LOW (ref 8.9–10.3)
Chloride: 96 mmol/L — ABNORMAL LOW (ref 98–111)
Creatinine, Ser: 15.11 mg/dL — ABNORMAL HIGH (ref 0.61–1.24)
GFR, Estimated: 4 mL/min — ABNORMAL LOW (ref >60)
Glucose, Bld: 121 mg/dL — ABNORMAL HIGH (ref 70–99)
Potassium: 4.8 mmol/L (ref 3.5–5.1)
Sodium: 140 mmol/L (ref 135–145)

## 2020-05-18 LAB — CBC
HCT: 28.8 % — ABNORMAL LOW (ref 39.0–52.0)
Hemoglobin: 9.1 g/dL — ABNORMAL LOW (ref 13.0–17.0)
MCH: 28.3 pg (ref 26.0–34.0)
MCHC: 31.6 g/dL (ref 30.0–36.0)
MCV: 89.4 fL (ref 80.0–100.0)
Platelets: 180 10*3/uL (ref 150–400)
RBC: 3.22 MIL/uL — ABNORMAL LOW (ref 4.22–5.81)
RDW: 16.4 % — ABNORMAL HIGH (ref 11.5–15.5)
WBC: 9.6 10*3/uL (ref 4.0–10.5)
nRBC: 0 % (ref 0.0–0.2)

## 2020-05-18 MED ORDER — DEXTROSE 5 % IV SOLN
3.0000 g | INTRAVENOUS | Status: DC
  Filled 2020-05-18: qty 3000

## 2020-05-18 MED ORDER — CEFAZOLIN SODIUM-DEXTROSE 2-4 GM/100ML-% IV SOLN: 2.0000 g | INTRAVENOUS | Status: DC

## 2020-05-18 MED ORDER — HYDROMORPHONE HCL 1 MG/ML IJ SOLN
0.5000 mg | INTRAMUSCULAR | Status: DC | PRN
  Administered 2020-05-18 – 2020-05-19 (×5): 0.5 mg via INTRAVENOUS
  Filled 2020-05-18 (×5): qty 0.5

## 2020-05-18 MED ORDER — HYDROMORPHONE HCL 1 MG/ML IJ SOLN: 0.5000 mg | INTRAMUSCULAR | Status: DC | PRN

## 2020-05-18 MED ORDER — FERRIC CITRATE 1 GM 210 MG(FE) PO TABS
210.0000 mg | ORAL_TABLET | Freq: Three times a day (TID) | ORAL | Status: DC
  Administered 2020-05-19 – 2020-05-20 (×5): 210 mg via ORAL
  Filled 2020-05-18 (×7): qty 1

## 2020-05-18 MED ORDER — CEFAZOLIN SODIUM-DEXTROSE 2-4 GM/100ML-% IV SOLN
2.0000 g | INTRAVENOUS | Status: DC
  Administered 2020-05-19: 2 g via INTRAVENOUS
  Filled 2020-05-18: qty 100

## 2020-05-18 NOTE — Progress Notes (Signed)
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 He called vascular clinic and he was referred to ED. He notes no fever/chills/ n/v/d/sob/chest pain, abdominal pain , cough or sore throat. He states he is at his baseline line health other than noted infection of his right arm AVF graft. On evaluation in ed U/s noted no clots, but noted inflammation consistent with cellulitis and could not rule out phlegmon. Patient is admitted for iv antibiotics and vascular follow up.  Vascular surgery was consulted during admission.  Patient underwent excision of right forearm arteriovenous graft on 05/16/2020.  No surgical complications.  Wound was wrapped in Penrose drain left in place.  Penrose drain continues with nonpurulent drainage.  Vascular surgery following for postop checks  Infectious disease followed up with patient.  Wound culture growing MSSA.  Antibiotics switched to cefazolin.  Patient will complete 4-week course.  To be administered with hemodialysis.   Assessment & Plan:   Principal Problem:   Infection of AV graft for dialysis [facility]) Active Problems:   Cellulitis  Infected AV graft Complicated soft tissue infection of RUE aVF Purulent cellulitis Status post AV graft excision 05/16/2020 Blood cultures with MSSA Plan: Antibiotic switched to Ancef, plan for 4-week course Infectious disease aware Outpatient antibiotic orders to be placed  Acute postoperative blood loss anemia Patient did have  drop in hemoglobin Significant bleeding noted from aVF site Currently stable with no overt bleeding noted Plan: Monitor hemoglobin Transfuse as needed goal hemoglobin 7  End-stage renal disease on hemodialysis Hyperkalemia Normal HD TTS Missed hemodialysis Thursday before admission Nephrology consulted from admission has been following Dialysis via chest Vas-Cath Plan: Inpatient HD per nephrology Daily renal function  GERD  started pantoprazole 40 mg twice daily  Chronic pain  - 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    DVT prophylaxis: SCDs Code Status: Full Family Communication: None today Disposition Plan: Status is: Inpatient  Remains inpatient appropriate because:Inpatient level of care appropriate due to severity of illness   Dispo: The patient is from: Home              Anticipated d/c is to: Home              Anticipated d/c date is: 1 day              Patient currently is not medically stable to d/c.   Difficult to place patient No   Vascular surgery follow-up for wound check tomorrow.  Home wound care requested.  If vascular able to pull Penrose drain anticipate discharge home on 05/19/2020      Consultants:   Nephrology  Infectious disease  Vascular surgery  Procedures:   AV graft excision 05/16/2020  Antimicrobials:  Cefazolin   Subjective: Patient seen and examined.  No pain complaints.  Endorses drainage from right upper extremity wound  Objective: Vitals:   05/18/20 0351 05/18/20 0352 05/18/20 0800 05/18/20 1209  BP: 121/83   123/85  Pulse: 78   76   Resp: '20 12 15 18  '$ Temp: 98 F (36.7 C)     TempSrc: Oral     SpO2: 100% 95% 96% 93%  Weight:      Height:        Intake/Output Summary (Last 24 hours) at 05/18/2020 1537 Last data filed at 05/18/2020 1300 Gross per 24 hour  Intake 240 ml  Output -  Net 240 ml   Filed Weights   05/14/20 1257 05/15/20 0532  Weight: 120.2 kg 123 kg    Examination:  General exam: Appears calm and comfortable  Respiratory system: Clear to auscultation. Respiratory effort normal. Cardiovascular system: S1 & S2 heard, RRR. No JVD, murmurs, rubs, gallops or clicks. No pedal edema.  Left chest Vas-Cath Gastrointestinal system: Abdomen is nondistended, soft and nontender. No organomegaly or masses felt. Normal bowel sounds heard. Central nervous system: Alert and oriented. No focal neurological deficits. Extremities: RUE in surgical wraps. Skin: No rashes, lesions or ulcers Psychiatry: Judgement and insight appear normal. Mood & affect appropriate.     Data Reviewed: I have personally reviewed following labs and imaging studies  CBC: Recent Labs  Lab 05/14/20 1317 05/14/20 2024 05/15/20 1051 05/16/20 0708 05/17/20 0636 05/18/20 0508  WBC 11.2* 9.7 8.4 6.2 9.2 9.6  NEUTROABS 8.2*  --   --   --   --   --   HGB 11.5* 10.1* 11.0* 9.7* 9.5* 9.1*  HCT 35.9* 31.5* 33.8* 29.4* 29.3* 28.8*  MCV 87.3 88.2 86.9 86.0 87.5 89.4  PLT 134* 112* 126* 123* 147* [id number]   Basic Metabolic Panel: Recent Labs  Lab 05/14/20 1317 05/14/20 2024 05/15/20 1051 05/16/20 0708 05/17/20 0636 05/18/20 0508  NA 136  --  136 140 139 140  K 5.3*  --  7.1* 5.1 6.1* 4.8  CL 96*  --  99 97* 100 96*  CO2 22  --  18* 26 21* 24  GLUCOSE 118*  --  95 95 156* 121*  BUN 67*  --  77* 51* 73* 75*  CREATININE 19.21* 16.73* 20.45* 15.63* 17.99* 15.11*  CALCIUM 8.0*  --  7.6* 7.5* 7.3* 7.2*  MG  --   --   --  2.1  --   --   PHOS  --   --   --  5.7*  --   --    GFR: Estimated Creatinine Clearance: 8.8 mL/min (A) (by C-G  formula based on SCr of 15.11 mg/dL (H)). Liver Function Tests: Recent Labs  Lab 05/14/20 1317 05/15/20 1051  AST 17 12*  ALT 16 15  ALKPHOS 60 61  BILITOT 0.8 0.8  PROT 8.2* 7.8  ALBUMIN 3.9 3.4*   No results for input(s): LIPASE, AMYLASE in the last 168 hours. No results for input(s): AMMONIA in the last 168 hours. Coagulation Profile: Recent Labs  Lab 05/14/20 1358  INR 1.2   Cardiac Enzymes: No results for input(s): CKTOTAL, CKMB, CKMBINDEX, TROPONINI in the last 168 hours. BNP (last 3 results) No results for input(s): PROBNP in the last 8760 hours. HbA1C: No results for input(s): HGBA1C in the last 72 hours. CBG: No results for input(s): GLUCAP in the  last 168 hours. Lipid Profile: No results for input(s): CHOL, HDL, LDLCALC, TRIG, CHOLHDL, LDLDIRECT in the last 72 hours. Thyroid Function Tests: No results for input(s): TSH, T4TOTAL, FREET4, T3FREE, THYROIDAB in the last 72 hours. Anemia Panel: No results for input(s): VITAMINB12, FOLATE, FERRITIN, TIBC, IRON, RETICCTPCT in the last 72 hours. Sepsis Labs: Recent Labs  Lab 05/14/20 1317  LATICACIDVEN 1.5    Recent Results (from the past 240 hour(s))  Blood culture (routine x 2)     Status: None (Preliminary result)   Collection Time: 05/14/20  1:17 PM   Specimen: BLOOD  Result Value Ref Range Status   Specimen Description BLOOD BLOOD RIGHT FOREARM  Final   Special Requests   Final    BOTTLES DRAWN AEROBIC AND ANAEROBIC Blood Culture adequate volume   Culture   Final    NO GROWTH 4 DAYS Performed at [facility], [address] [place], [place] [address]    Report Status PENDING  Incomplete  Blood culture (routine x 2)     Status: None (Preliminary result)   Collection Time: 05/14/20  1:17 PM   Specimen: BLOOD  Result Value Ref Range Status   Specimen Description BLOOD BLOOD RIGHT HAND  Final   Special Requests   Final    BOTTLES DRAWN AEROBIC AND ANAEROBIC Blood Culture adequate volume    Culture   Final    NO GROWTH 4 DAYS Performed at [facility], [address] [place], [place] [address]    Report Status PENDING  Incomplete  MRSA PCR Screening     Status: None   Collection Time: 05/14/20  8:08 PM   Specimen: Nasal Mucosa; Nasopharyngeal  Result Value Ref Range Status   MRSA by PCR NEGATIVE NEGATIVE Final    Comment:        The GeneXpert MRSA Assay (FDA approved for NASAL specimens only), is one component of a comprehensive MRSA colonization surveillance program. It is not intended to diagnose MRSA infection nor to guide or monitor treatment for MRSA infections. Performed at [facility] [address] [place], [place] [address]   SARS Coronavirus 2 by RT PCR (hospital order, performed in [facility] hospital lab) Nasopharyngeal Nasopharyngeal Swab     Status: None   Collection Time: 05/15/20  1:05 AM   Specimen: Nasopharyngeal Swab  Result Value Ref Range Status   SARS Coronavirus 2 NEGATIVE NEGATIVE Final    Comment: (NOTE) SARS-CoV-2 target nucleic acids are NOT DETECTED.  The SARS-CoV-2 RNA is generally detectable in upper and lower respiratory specimens during the acute phase of infection. The lowest concentration of SARS-CoV-2 viral copies this assay can detect is 250 copies / mL. A negative result does not preclude SARS-CoV-2 infection and should not be used as the sole basis for treatment or other patient management decisions.  A negative result may occur with improper specimen collection / handling, submission of specimen other than nasopharyngeal swab, presence of viral mutation(s) within the areas targeted by this assay, and inadequate number of viral copies (<250 copies / mL). A negative result must be combined with clinical observations, patient history, and epidemiological information.  Fact Sheet for Patients:   StrictlyIdeas.no  Fact Sheet for Healthcare  Providers: BankingDealers.co.za  This test is not yet approved or  cleared by the [place] FDA and has been authorized for detection and/or diagnosis of SARS-CoV-2 by FDA under an Emergency Use Authorization (EUA).  This EUA will remain in effect (meaning this test can be  used) for the duration of the COVID-19 declaration under Section 564(b)(1) of the Act, 21 U.S.C. section 360bbb-3(b)(1), unless the authorization is terminated or revoked sooner.  Performed at [facility], [address] [place], [place] [address]   Aerobic/Anaerobic Culture (surgical/deep wound)     Status: None (Preliminary result)   Collection Time: 05/16/20 10:20 AM   Specimen: PATH Other; Abscess  Result Value Ref Range Status   Specimen Description   Final    ABSCESS RIGHT ELBOW Performed at [facility], [address], [place], [place] [address]    Special Requests   Final    NONE Performed at [facility], [address] [place], [place] [address]    Gram Stain   Final    RARE WBC PRESENT, PREDOMINANTLY PMN RARE GRAM POSITIVE COCCI IN PAIRS Performed at [facility], [place] [address] [place], [place] [address]    Culture   Final    FEW STAPHYLOCOCCUS AUREUS NO ANAEROBES ISOLATED; CULTURE IN PROGRESS FOR 5 DAYS    Report Status PENDING  Incomplete   Organism ID, Bacteria STAPHYLOCOCCUS AUREUS  Final      Susceptibility   Staphylococcus aureus - MIC*    CIPROFLOXACIN <=0.5 SENSITIVE Sensitive     ERYTHROMYCIN >=8 RESISTANT Resistant     GENTAMICIN <=0.5 SENSITIVE Sensitive     OXACILLIN <=0.25 SENSITIVE Sensitive     TETRACYCLINE >=16 RESISTANT Resistant     VANCOMYCIN 1 SENSITIVE Sensitive     TRIMETH/SULFA <=10 SENSITIVE Sensitive     CLINDAMYCIN <=0.25 SENSITIVE Sensitive     RIFAMPIN <=0.5 SENSITIVE Sensitive     Inducible Clindamycin NEGATIVE Sensitive     * FEW STAPHYLOCOCCUS AUREUS  Aerobic/Anaerobic Culture (surgical/deep  wound)     Status: None (Preliminary result)   Collection Time: 05/16/20 11:41 AM   Specimen: PATH Other  Result Value Ref Range Status   Specimen Description   Final    GRAFT RIGHT Performed at [facility], [address], [place], [place] [address]    Special Requests   Final    NONE Performed at [facility], [address] [place], [place] [address]    Gram Stain   Final    RARE WBC PRESENT, PREDOMINANTLY PMN RARE GRAM POSITIVE COCCI IN PAIRS Performed at [facility], [place] [address], [place], [place] [address]    Culture   Final    MODERATE STAPHYLOCOCCUS AUREUS NO ANAEROBES ISOLATED; CULTURE IN PROGRESS FOR 5 DAYS    Report Status PENDING  Incomplete   Organism ID, Bacteria STAPHYLOCOCCUS AUREUS  Final      Susceptibility   Staphylococcus aureus - MIC*    CIPROFLOXACIN <=0.5 SENSITIVE Sensitive     ERYTHROMYCIN >=8 RESISTANT Resistant     GENTAMICIN <=0.5 SENSITIVE Sensitive     OXACILLIN 0.5 SENSITIVE Sensitive     TETRACYCLINE >=16 RESISTANT Resistant     VANCOMYCIN 1 SENSITIVE Sensitive     TRIMETH/SULFA <=10 SENSITIVE Sensitive     CLINDAMYCIN <=0.25 SENSITIVE Sensitive     RIFAMPIN <=0.5 SENSITIVE Sensitive     Inducible Clindamycin NEGATIVE Sensitive     * MODERATE STAPHYLOCOCCUS AUREUS         Radiology Studies: No results found.      Scheduled Meds: . Chlorhexidine Gluconate Cloth  6 each Topical Daily  . epoetin (EPOGEN/PROCRIT) injection  10,000 Units Intravenous Q M,W,F-HD  . ferric citrate  420 mg Oral TID WC  . heparin  5,000 Units Subcutaneous Q8H  . pantoprazole  40 mg Oral QPM  . sodium zirconium cyclosilicate  10 g Oral BID  . Vitamin D (Ergocalciferol)  50,000 Units Oral Q7 days   Continuous Infusions: . sodium chloride 0 mL/hr at 05/16/20 0123  . [START ON 05/21/2020]  ceFAZolin (ANCEF) IV    . [START ON 05/24/2020]  ceFAZolin (ANCEF) IV    . [START ON 05/19/2020]  ceFAZolin (ANCEF) IV       LOS: 4 days     Time spent: 25 minutes    Sidney Ace, MD [facility] Pager [phone]  If 7PM-7AM, please contact night-coverage 05/18/2020, 3:37 PM

## 2020-05-18 NOTE — Progress Notes (Addendum)
Mobility Specialist - Progress Note   05/18/20 1149  Mobility  Activity Ambulated in room;Ambulated in hall  Level of Assistance Modified independent, requires aide device or extra time (required extra tiem d/t having mulitple lines and cords)  Assistive Device Other (Comment) (pushed IV pole)  Distance Ambulated (ft) 520 ft  Mobility Response Tolerated well  Mobility performed by Mobility specialist  $Mobility charge 1 Mobility    Pt laying in bed upon arrival. Pt agreed to session. Pt mod. Independent d/t requiring extra time for safety. Pt ambulated 520' total in room and hallway. No LOB noted. CGA utilized for safety. Noted tele monitor alarmed d/t desat to 76%. This author checked O2 via fingertip pulse oximeter, O2 sat > 90%. Pt denies pain or SOB. States "I feel great", "I've been wanting to walk". Overall, pt tolerated session well. Pt pleasant and motivated. Pt left sitting on recliner w all needs placed in reach. Nurse was notified.     Kayce Chismar Mobility Specialist  05/18/20, 11:52 AM

## 2020-05-18 NOTE — Progress Notes (Addendum)
[facility]  ROUNDING NOTE   Subjective:   Hemodialysis treatment yesterday. Tolerated treatment well. UF of 1879m.   Objective:  Vital signs in last 24 hours:  Temp:  [97.5 F (36.4 C)-98 F (36.7 C)] 98 F (36.7 C) (01/25 0351) Pulse Rate:  [64-87] 78 (01/25 0351) Resp:  [12-27] 12 (01/25 0352) BP: (106-140)/(74-110) 121/83 (01/25 0351) SpO2:  [89 %-100 %] 95 % (01/25 0352) FiO2 (%):  [0 %] 0 % (01/25 0352)  Weight change:  Filed Weights   05/14/20 1257 05/15/20 0532  Weight: 120.2 kg 123 kg    Intake/Output: I/O last 3 completed shifts: In: 412.3 [P.O.:120; I.V.:292.3] Out: 1800 [Other:1800]   Intake/Output this shift:  No intake/output data recorded.  Physical Exam: General: NAD, sitting up in bed  Head: Normocephalic, atraumatic. Moist oral mucosal membranes  Eyes: Anicteric, PERRL  Neck: Supple, trachea midline  Lungs:  Clear to auscultation  Heart: Regular rate and rhythm  Abdomen:  Soft, nontender,   Extremities:  left hand in clean and dry dressings  Neurologic: Nonfocal, moving all four extremities  Skin: Erythema around AVG, tender   Access: Right IJ catheter,      Basic Metabolic Panel: Recent Labs  Lab 05/14/20 1317 05/14/20 2024 05/15/20 1051 05/16/20 0708 05/17/20 0636 05/18/20 0508  NA 136  --  136 140 139 140  K 5.3*  --  7.1* 5.1 6.1* 4.8  CL 96*  --  99 97* 100 96*  CO2 22  --  18* 26 21* 24  GLUCOSE 118*  --  95 95 156* 121*  BUN 67*  --  77* 51* 73* 75*  CREATININE 19.21* 16.73* 20.45* 15.63* 17.99* 15.11*  CALCIUM 8.0*  --  7.6* 7.5* 7.3* 7.2*  MG  --   --   --  2.1  --   --   PHOS  --   --   --  5.7*  --   --     Liver Function Tests: Recent Labs  Lab 05/14/20 1317 05/15/20 1051  AST 17 12*  ALT 16 15  ALKPHOS 60 61  BILITOT 0.8 0.8  PROT 8.2* 7.8  ALBUMIN 3.9 3.4*   No results for input(s): LIPASE, AMYLASE in the last 168 hours. No results for input(s): AMMONIA in the last 168  hours.  CBC: Recent Labs  Lab 05/14/20 1317 05/14/20 2024 05/15/20 1051 05/16/20 0708 05/17/20 0636 05/18/20 0508  WBC 11.2* 9.7 8.4 6.2 9.2 9.6  NEUTROABS 8.2*  --   --   --   --   --   HGB 11.5* 10.1* 11.0* 9.7* 9.5* 9.1*  HCT 35.9* 31.5* 33.8* 29.4* 29.3* 28.8*  MCV 87.3 88.2 86.9 86.0 87.5 89.4  PLT 134* 112* 126* 123* 147* 180    Cardiac Enzymes: No results for input(s): CKTOTAL, CKMB, CKMBINDEX, TROPONINI in the last 168 hours.  BNP: Invalid input(s): POCBNP  CBG: No results for input(s): GLUCAP in the last 168 hours.  Microbiology: Results for orders placed or performed during the hospital encounter of 05/14/20  Blood culture (routine x 2)     Status: None (Preliminary result)   Collection Time: 05/14/20  1:17 PM   Specimen: BLOOD  Result Value Ref Range Status   Specimen Description BLOOD BLOOD RIGHT FOREARM  Final   Special Requests   Final    BOTTLES DRAWN AEROBIC AND ANAEROBIC Blood Culture adequate volume   Culture   Final    NO GROWTH 4 DAYS Performed at  [facility], [address], [place], [place] [address]    Report Status PENDING  Incomplete  Blood culture (routine x 2)     Status: None (Preliminary result)   Collection Time: 05/14/20  1:17 PM   Specimen: BLOOD  Result Value Ref Range Status   Specimen Description BLOOD BLOOD RIGHT HAND  Final   Special Requests   Final    BOTTLES DRAWN AEROBIC AND ANAEROBIC Blood Culture adequate volume   Culture   Final    NO GROWTH 4 DAYS Performed at [facility], [address] [place], [place] [address]    Report Status PENDING  Incomplete  MRSA PCR Screening     Status: None   Collection Time: 05/14/20  8:08 PM   Specimen: Nasal Mucosa; Nasopharyngeal  Result Value Ref Range Status   MRSA by PCR NEGATIVE NEGATIVE Final    Comment:        The GeneXpert MRSA Assay (FDA approved for NASAL specimens only), is one component of a comprehensive MRSA colonization surveillance  program. It is not intended to diagnose MRSA infection nor to guide or monitor treatment for MRSA infections. Performed at [facility], [address] [place], [place] [address]   SARS Coronavirus 2 by RT PCR (hospital order, performed in [facility] hospital lab) Nasopharyngeal Nasopharyngeal Swab     Status: None   Collection Time: 05/15/20  1:05 AM   Specimen: Nasopharyngeal Swab  Result Value Ref Range Status   SARS Coronavirus 2 NEGATIVE NEGATIVE Final    Comment: (NOTE) SARS-CoV-2 target nucleic acids are NOT DETECTED.  The SARS-CoV-2 RNA is generally detectable in upper and lower respiratory specimens during the acute phase of infection. The lowest concentration of SARS-CoV-2 viral copies this assay can detect is 250 copies / mL. A negative result does not preclude SARS-CoV-2 infection and should not be used as the sole basis for treatment or other patient management decisions.  A negative result may occur with improper specimen collection / handling, submission of specimen other than nasopharyngeal swab, presence of viral mutation(s) within the areas targeted by this assay, and inadequate number of viral copies (<250 copies / mL). A negative result must be combined with clinical observations, patient history, and epidemiological information.  Fact Sheet for Patients:   StrictlyIdeas.no  Fact Sheet for Healthcare Providers: BankingDealers.co.za  This test is not yet approved or  cleared by the [place] FDA and has been authorized for detection and/or diagnosis of SARS-CoV-2 by FDA under an Emergency Use Authorization (EUA).  This EUA will remain in effect (meaning this test can be used) for the duration of the COVID-19 declaration under Section 564(b)(1) of the Act, 21 U.S.C. section 360bbb-3(b)(1), unless the authorization is terminated or revoked sooner.  Performed at [facility], [address] [place], [place] [address]   Aerobic/Anaerobic Culture (surgical/deep wound)     Status: None (Preliminary result)   Collection Time: 05/16/20 10:20 AM   Specimen: PATH Other; Abscess  Result Value Ref Range Status   Specimen Description   Final    ABSCESS RIGHT ELBOW Performed at [facility], [address] [place], [place] [address]    Special Requests   Final    NONE Performed at [facility], [address] [place], [place] [address]    Gram Stain   Final    RARE WBC PRESENT, PREDOMINANTLY PMN RARE GRAM POSITIVE COCCI IN PAIRS    Culture   Final  FEW STAPHYLOCOCCUS AUREUS CULTURE REINCUBATED FOR BETTER GROWTH SUSCEPTIBILITIES TO FOLLOW Performed at [facility], [place] [address] [place], [place] [address]    Report Status PENDING  Incomplete  Aerobic/Anaerobic Culture (surgical/deep wound)     Status: None (Preliminary result)   Collection Time: 05/16/20 11:41 AM   Specimen: PATH Other  Result Value Ref Range Status   Specimen Description   Final    GRAFT RIGHT Performed at [facility], [address] [place], [place] [address]    Special Requests   Final    NONE Performed at [facility], [address] [place], [place] [address]    Gram Stain   Final    RARE WBC PRESENT, PREDOMINANTLY PMN RARE GRAM POSITIVE COCCI IN PAIRS    Culture   Final    MODERATE STAPHYLOCOCCUS AUREUS SUSCEPTIBILITIES TO FOLLOW Performed at [facility], [place] [address] [place], [place] [address]    Report Status PENDING  Incomplete    Coagulation Studies: No results for input(s): LABPROT, INR in the last 72 hours.  Urinalysis: No results for input(s): COLORURINE, LABSPEC, PHURINE, GLUCOSEU, HGBUR, BILIRUBINUR, KETONESUR, PROTEINUR, UROBILINOGEN, NITRITE, LEUKOCYTESUR in the last 72 hours.  Invalid input(s): APPERANCEUR    Imaging: No results found.   Medications:   . sodium chloride 0 mL/hr at 05/16/20 0123  .  vancomycin 1,000 mg (05/17/20 2001)   . Chlorhexidine Gluconate Cloth  6 each Topical Daily  . epoetin (EPOGEN/PROCRIT) injection  10,000 Units Intravenous Q M,W,F-HD  . ferric citrate  420 mg Oral TID WC  . heparin  5,000 Units Subcutaneous Q8H  . HYDROmorphone   Intravenous Q4H  . pantoprazole  40 mg Oral QPM  . sodium zirconium cyclosilicate  10 g Oral BID  . Vitamin D (Ergocalciferol)  50,000 Units Oral Q7 days   sodium chloride, acetaminophen **OR** acetaminophen, albuterol, alum & mag hydroxide-simeth, diphenhydrAMINE **OR** diphenhydrAMINE, naloxone **AND** sodium chloride flush, promethazine, promethazine  Assessment/ Plan:  Patrick Downs is a 44 y.o.  male with ESRD on HD, GERD, guillain barre, previous CIVD infection, HTN, CHF, depression who was admitted on 05/14/2020 for Cellulitis [L03.90] Abscess of arm, right [L02.413] Status post excision of his right forearm AVG by Dr. Bridgett Larsson on 05/17/2019  [facility] [place]   1. ESRD with hyperkalemia With complication of dialysis device. AVG resection. Now with RIJ temp HD catheter - MWF while inpatient.  - lokelma  2. Anemia of CKD  - EPO with HD treatment.   3. Secondary Hyperparathyroidism:  Not currently on binders - Auryxia with meals.   4. Hypertension: currently not on any blood pressure agents.  5. AVG infection: MSSA - will need 4 weeks of antibiotics. Cefazolin 2g on Tuesdays, 2g on Thursdays and 3g on Saturdays. End date 06/09/20.    LOS: 4 Clotine Heiner 1/25/20229:32 AM

## 2020-05-18 NOTE — Progress Notes (Signed)
Hemodialysis patient known at [facility] TTS 9:00am, patient drives self to treatments and stated no complaints. Please contact me with any dialysis placement concerns.  Elvera Bicker Dialysis  Coordinator [phone]

## 2020-05-18 NOTE — Progress Notes (Signed)
[facility] Daily Progress Note   Subjective: 05/16/20 Excision of right forearmarteriovenous graft  No issues overnight. Patient with continued incisional pain to the right upper extremity.  Objective: Vitals:   05/18/20 0351 05/18/20 0352 05/18/20 0800 05/18/20 1209  BP: 121/83   123/85  Pulse: 78   76  Resp: '20 12 15 18  '$ Temp: 98 F (36.7 C)     TempSrc: Oral     SpO2: 100% 95% 96% 93%  Weight:      Height:        Intake/Output Summary (Last 24 hours) at 05/18/2020 1357 Last data filed at 05/18/2020 0600 Gross per 24 hour  Intake 120 ml  Output --  Net 120 ml   Physical Exam: A&Ox3, NAD CV: RRR Pulmonary: CTA Bilaterally Abdomen: Soft, Nontender, Nondistended Vascular:             Right upper extremity: Dressing removed. Penrose is intact. Serous drainage. Staples are clean dry and intact. Extremity is soft. Extremities warm distally to fingers. Motor/sensory is intact.   Laboratory: CBC    Component Value Date/Time   WBC 9.6 05/18/2020 0508   HGB 9.1 (L) 05/18/2020 0508   HGB 7.7 (L) 08/22/2014 0653   HCT 28.8 (L) 05/18/2020 0508   HCT 23.9 (L) 08/22/2014 0653   PLT 180 05/18/2020 0508   PLT 195 08/22/2014 0653   BMET    Component Value Date/Time   NA 140 05/18/2020 0508   NA 136 08/22/2014 0653   K 4.8 05/18/2020 0508   K 5.1 08/22/2014 0653   CL 96 (L) 05/18/2020 0508   CL 95 (L) 08/22/2014 0653   CO2 24 05/18/2020 0508   CO2 27 08/22/2014 0653   GLUCOSE 121 (H) 05/18/2020 0508   GLUCOSE 176 (H) 08/22/2014 0653   BUN 75 (H) 05/18/2020 0508   BUN 71 (H) 08/22/2014 0653   CREATININE 15.11 (H) 05/18/2020 0508   CREATININE 8.87 (H) 08/22/2014 0653   CALCIUM 7.2 (L) 05/18/2020 0508   CALCIUM 7.1 (L) 08/22/2014 0653   GFRNONAA 4 (L) 05/18/2020 0508   GFRNONAA 7 (L) 08/22/2014 0653   GFRAA 4 (L) 01/27/2020 0511   GFRAA 8 (L) 08/22/2014 0653   Assessment/Planning: Patrick Riffle [name] a 45 y.o.malewho presents with infected  right forearm arteriovenous graft constructed with Artegraft s/p excision - POD#2  1) WBC in normal range 2) Hemoglobin stable 3) No further plan for surgery during this inpatient stay.  4) Some nonpurulent serous drainage noted from Penrose. May pull Penrose drain tomorrow. We will plan on bringing the patient back to the operating room for revision in the near future possibly a week or 2 to allow the area to drain and heal. 4) Patient should continue to dialyze through his PermCath 5) we will consult social work in regard to visiting nurse services for wound care. Face-to-face complete. Daily dressing orders placed.  Discussed with Dr. Ellis Parents Jacee Enerson PA-C 05/18/2020 1:57 PM

## 2020-05-19 DIAGNOSIS — N185 Chronic kidney disease, stage 5: Secondary | ICD-10-CM | POA: Diagnosis not present

## 2020-05-19 DIAGNOSIS — G610 Guillain-Barre syndrome: Secondary | ICD-10-CM

## 2020-05-19 DIAGNOSIS — B9561 Methicillin susceptible Staphylococcus aureus infection as the cause of diseases classified elsewhere: Secondary | ICD-10-CM

## 2020-05-19 DIAGNOSIS — D631 Anemia in chronic kidney disease: Secondary | ICD-10-CM | POA: Diagnosis not present

## 2020-05-19 DIAGNOSIS — T827XXA Infection and inflammatory reaction due to other cardiac and vascular devices, implants and grafts, initial encounter: Secondary | ICD-10-CM | POA: Diagnosis not present

## 2020-05-19 LAB — BASIC METABOLIC PANEL
Anion gap: 19 — ABNORMAL HIGH (ref 5–15)
BUN: 93 mg/dL — ABNORMAL HIGH (ref 6–20)
CO2: 24 mmol/L (ref 22–32)
Calcium: 7 mg/dL — ABNORMAL LOW (ref 8.9–10.3)
Chloride: 97 mmol/L — ABNORMAL LOW (ref 98–111)
Creatinine, Ser: 16.92 mg/dL — ABNORMAL HIGH (ref 0.61–1.24)
GFR, Estimated: 3 mL/min — ABNORMAL LOW (ref >60)
Glucose, Bld: 88 mg/dL (ref 70–99)
Potassium: 4.5 mmol/L (ref 3.5–5.1)
Sodium: 140 mmol/L (ref 135–145)

## 2020-05-19 LAB — RENAL FUNCTION PANEL
Albumin: 3.2 g/dL — ABNORMAL LOW (ref 3.5–5.0)
Anion gap: 20 — ABNORMAL HIGH (ref 5–15)
BUN: 95 mg/dL — ABNORMAL HIGH (ref 6–20)
CO2: 23 mmol/L (ref 22–32)
Calcium: 7 mg/dL — ABNORMAL LOW (ref 8.9–10.3)
Chloride: 98 mmol/L (ref 98–111)
Creatinine, Ser: 17.36 mg/dL — ABNORMAL HIGH (ref 0.61–1.24)
GFR, Estimated: 3 mL/min — ABNORMAL LOW (ref >60)
Glucose, Bld: 85 mg/dL (ref 70–99)
Phosphorus: 4.4 mg/dL (ref 2.5–4.6)
Potassium: 4.9 mmol/L (ref 3.5–5.1)
Sodium: 141 mmol/L (ref 135–145)

## 2020-05-19 LAB — CBC
HCT: 28.5 % — ABNORMAL LOW (ref 39.0–52.0)
HCT: 26.9 % — ABNORMAL LOW (ref 39.0–52.0)
Hemoglobin: 9 g/dL — ABNORMAL LOW (ref 13.0–17.0)
Hemoglobin: 8.7 g/dL — ABNORMAL LOW (ref 13.0–17.0)
MCH: 27.7 pg (ref 26.0–34.0)
MCH: 28.2 pg (ref 26.0–34.0)
MCHC: 31.6 g/dL (ref 30.0–36.0)
MCHC: 32.3 g/dL (ref 30.0–36.0)
MCV: 87.7 fL (ref 80.0–100.0)
MCV: 87.3 fL (ref 80.0–100.0)
Platelets: 164 10*3/uL (ref 150–400)
Platelets: 151 10*3/uL (ref 150–400)
RBC: 3.25 MIL/uL — ABNORMAL LOW (ref 4.22–5.81)
RBC: 3.08 MIL/uL — ABNORMAL LOW (ref 4.22–5.81)
RDW: 16.2 % — ABNORMAL HIGH (ref 11.5–15.5)
RDW: 16.5 % — ABNORMAL HIGH (ref 11.5–15.5)
WBC: 8 10*3/uL (ref 4.0–10.5)
WBC: 8 10*3/uL (ref 4.0–10.5)
nRBC: 0 % (ref 0.0–0.2)
nRBC: 0 % (ref 0.0–0.2)

## 2020-05-19 LAB — CULTURE, BLOOD (ROUTINE X 2)
Culture: NO GROWTH
Culture: NO GROWTH
Special Requests: ADEQUATE
Special Requests: ADEQUATE

## 2020-05-19 MED ORDER — OXYCODONE HCL 5 MG PO TABS
10.0000 mg | ORAL_TABLET | ORAL | Status: DC | PRN
  Administered 2020-05-19 – 2020-05-20 (×4): 10 mg via ORAL
  Filled 2020-05-19 (×4): qty 2

## 2020-05-19 NOTE — Progress Notes (Signed)
[facility] Daily Progress Note  Subjective: 05/16/20 Excision of right forearmarteriovenous graft  No issues overnight. Patient with continued incisional pain to the right upper extremity. Seen after dialysis.   Objective: Vitals:   05/19/20 1245 05/19/20 1300 05/19/20 1315 05/19/20 1351  BP: 130/90 116/88 (!) 118/92 (!) 127/92  Pulse: 75 73 72 75  Resp: '13 13 12 16  '$ Temp:    97.8 F (36.6 C)  TempSrc:    Oral  SpO2: 99% 99% 100% 100%  Weight:      Height:        Intake/Output Summary (Last 24 hours) at 05/19/2020 1459 Last data filed at 05/19/2020 1315 Gross per 24 hour  Intake 300 ml  Output 2500 ml  Net -2200 ml   Physical Exam: A&Ox3, NAD CV: RRR Pulmonary: CTA Bilaterally Abdomen: Soft, Nontender, Nondistended Vascular: Right upper extremity:Dressing removed. Penrose removed. Serous drainage. Staples are clean dry and intact. Extremity is soft. Extremities warm distally to fingers. Motor/sensory is intact.   Laboratory: CBC    Component Value Date/Time   WBC 8.0 05/19/2020 0926   HGB 8.7 (L) 05/19/2020 0926   HGB 7.7 (L) 08/22/2014 0653   HCT 26.9 (L) 05/19/2020 0926   HCT 23.9 (L) 08/22/2014 0653   PLT 151 05/19/2020 0926   PLT 195 08/22/2014 0653   BMET    Component Value Date/Time   NA 141 05/19/2020 0926   NA 136 08/22/2014 0653   K 4.9 05/19/2020 0926   K 5.1 08/22/2014 0653   CL 98 05/19/2020 0926   CL 95 (L) 08/22/2014 0653   CO2 23 05/19/2020 0926   CO2 27 08/22/2014 0653   GLUCOSE 85 05/19/2020 0926   GLUCOSE 176 (H) 08/22/2014 0653   BUN 95 (H) 05/19/2020 0926   BUN 71 (H) 08/22/2014 0653   CREATININE 17.36 (H) 05/19/2020 0926   CREATININE 8.87 (H) 08/22/2014 0653   CALCIUM 7.0 (L) 05/19/2020 0926   CALCIUM 7.1 (L) 08/22/2014 0653   GFRNONAA 3 (L) 05/19/2020 0926   GFRNONAA 7 (L) 08/22/2014 0653   GFRAA 4 (L) 01/27/2020 0511   GFRAA 8 (L) 08/22/2014 0653   Assessment/Planning: Patrick Downs  [name] a 45 y.o.malewho presents with infected right forearm arteriovenous graft constructed with Artegrafts/p excision - POD#3  1) WBCin normal range 2) Hemoglobin stable 3) No further plan for surgery during this inpatient stay.  4) Drain removed. Will plan on bringing the patient back to the operating room for revision in the near future possibly a week or 2 to allow the area to drain and heal. 4) Patient should continue to dialyze through his PermCath 5)we will consult social work in regard to visiting nurse services for wound care.Face-to-face complete. Daily dressing orders placed.  Discussed with Dr. Ellis Parents Anali Cabanilla PA-C 05/19/2020 2:59 PM

## 2020-05-19 NOTE — Anesthesia Postprocedure Evaluation (Signed)
Anesthesia Post Note  Patient: Patrick Downs  Procedure(s) Performed: REMOVAL OF GRAFT (Right )  Patient location during evaluation: PACU Anesthesia Type: General Level of consciousness: awake and alert and oriented Pain management: pain level controlled Vital Signs Assessment: post-procedure vital signs reviewed and stable Respiratory status: spontaneous breathing Cardiovascular status: blood pressure returned to baseline Anesthetic complications: no   No complications documented.   Last Vitals:  Vitals:   05/19/20 0444 05/19/20 0834  BP: 133/90 (!) 132/98  Pulse: 81 79  Resp: 16 18  Temp: 36.6 C 36.4 C  SpO2: 99% 99%    Last Pain:  Vitals:   05/19/20 0834  TempSrc: Oral  PainSc:                  Annibelle Brazie

## 2020-05-19 NOTE — Progress Notes (Signed)
Mobility Specialist - Progress Note   05/19/20 1156  Mobility  Activity Contraindicated/medical hold  Mobility performed by Mobility specialist    Pt currently out of room for HD treatment. Will hold off and re-attempt when pt is available.      Briar Witherspoon Mobility Specialist  05/19/20, 11:57 AM

## 2020-05-19 NOTE — Progress Notes (Signed)
Patient returned from HD. Dressing to right arm changecd

## 2020-05-19 NOTE — Progress Notes (Signed)
[facility]  ROUNDING NOTE   Subjective:   Seen and examined on hemodialysis treatment. Tolerated treatment well.     HEMODIALYSIS FLOWSHEET:  Blood Flow Rate (mL/min): 200 mL/min Arterial Pressure (mmHg): -180 mmHg Venous Pressure (mmHg): 120 mmHg Transmembrane Pressure (mmHg): 80 mmHg Ultrafiltration Rate (mL/min): 1190 mL/min Dialysate Flow Rate (mL/min): 600 ml/min Conductivity: Machine : 13.8 Conductivity: Machine : 13.8 Dialysis Fluid Bolus: Normal Saline Bolus Amount (mL): 300 mL    Objective:  Vital signs in last 24 hours:  Temp:  [97.6 F (36.4 C)-99.2 F (37.3 C)] 99.2 F (37.3 C) (01/26 1605) Pulse Rate:  [68-81] 77 (01/26 1605) Resp:  [10-18] 16 (01/26 1605) BP: (100-158)/(78-128) 112/90 (01/26 1605) SpO2:  [94 %-100 %] 96 % (01/26 1605)  Weight change:  Filed Weights   05/14/20 1257 05/15/20 0532  Weight: 120.2 kg 123 kg    Intake/Output: I/O last 3 completed shifts: In: 540 [P.O.:240; I.V.:100; IV Piggyback:200] Out: 0    Intake/Output this shift:  Total I/O In: -  Out: 2500 [Other:2500]  Physical Exam: General: NAD, sitting up in bed  Head: Normocephalic, atraumatic. Moist oral mucosal membranes  Eyes: Anicteric, PERRL  Neck: Supple, trachea midline  Lungs:  Clear to auscultation  Heart: Regular rate and rhythm  Abdomen:  Soft, nontender,   Extremities:  left hand in clean and dry dressings  Neurologic: Nonfocal, moving all four extremities  Skin: Erythema around AVG, tender   Access: Right IJ catheter,      Basic Metabolic Panel: Recent Labs  Lab 05/16/20 0708 05/17/20 0636 05/18/20 0508 05/19/20 0536 05/19/20 0926  NA 140 139 140 140 141  K 5.1 6.1* 4.8 4.5 4.9  CL 97* 100 96* 97* 98  CO2 26 21* '24 24 23  '$ GLUCOSE 95 156* 121* 88 85  BUN 51* 73* 75* 93* 95*  CREATININE 15.63* 17.99* 15.11* 16.92* 17.36*  CALCIUM 7.5* 7.3* 7.2* 7.0* 7.0*  MG 2.1  --   --   --   --   PHOS 5.7*  --   --   --  4.4    Liver  Function Tests: Recent Labs  Lab 05/14/20 1317 05/15/20 1051 05/19/20 0926  AST 17 12*  --   ALT 16 15  --   ALKPHOS 60 61  --   BILITOT 0.8 0.8  --   PROT 8.2* 7.8  --   ALBUMIN 3.9 3.4* 3.2*   No results for input(s): LIPASE, AMYLASE in the last 168 hours. No results for input(s): AMMONIA in the last 168 hours.  CBC: Recent Labs  Lab 05/14/20 1317 05/14/20 2024 05/16/20 0708 05/17/20 0636 05/18/20 0508 05/19/20 0536 05/19/20 0926  WBC 11.2*   < > 6.2 9.2 9.6 8.0 8.0  NEUTROABS 8.2*  --   --   --   --   --   --   HGB 11.5*   < > 9.7* 9.5* 9.1* 9.0* 8.7*  HCT 35.9*   < > 29.4* 29.3* 28.8* 28.5* 26.9*  MCV 87.3   < > 86.0 87.5 89.4 87.7 87.3  PLT 134*   < > 123* 147* 180 164 151   < > = values in this interval not displayed.    Cardiac Enzymes: No results for input(s): CKTOTAL, CKMB, CKMBINDEX, TROPONINI in the last 168 hours.  BNP: Invalid input(s): POCBNP  CBG: No results for input(s): GLUCAP in the last 168 hours.  Microbiology: Results for orders placed or performed during the hospital encounter of  05/14/20  Blood culture (routine x 2)     Status: None   Collection Time: 05/14/20  1:17 PM   Specimen: BLOOD  Result Value Ref Range Status   Specimen Description BLOOD BLOOD RIGHT FOREARM  Final   Special Requests   Final    BOTTLES DRAWN AEROBIC AND ANAEROBIC Blood Culture adequate volume   Culture   Final    NO GROWTH 5 DAYS Performed at [facility], [address] [place], [place] [address]    Report Status 05/19/2020 FINAL  Final  Blood culture (routine x 2)     Status: None   Collection Time: 05/14/20  1:17 PM   Specimen: BLOOD  Result Value Ref Range Status   Specimen Description BLOOD BLOOD RIGHT HAND  Final   Special Requests   Final    BOTTLES DRAWN AEROBIC AND ANAEROBIC Blood Culture adequate volume   Culture   Final    NO GROWTH 5 DAYS Performed at [facility], [address], [place], [place] [address]    Report  Status 05/19/2020 FINAL  Final  MRSA PCR Screening     Status: None   Collection Time: 05/14/20  8:08 PM   Specimen: Nasal Mucosa; Nasopharyngeal  Result Value Ref Range Status   MRSA by PCR NEGATIVE NEGATIVE Final    Comment:        The GeneXpert MRSA Assay (FDA approved for NASAL specimens only), is one component of a comprehensive MRSA colonization surveillance program. It is not intended to diagnose MRSA infection nor to guide or monitor treatment for MRSA infections. Performed at [facility], [address] [place], [place] [address]   SARS Coronavirus 2 by RT PCR (hospital order, performed in [facility] hospital lab) Nasopharyngeal Nasopharyngeal Swab     Status: None   Collection Time: 05/15/20  1:05 AM   Specimen: Nasopharyngeal Swab  Result Value Ref Range Status   SARS Coronavirus 2 NEGATIVE NEGATIVE Final    Comment: (NOTE) SARS-CoV-2 target nucleic acids are NOT DETECTED.  The SARS-CoV-2 RNA is generally detectable in upper and lower respiratory specimens during the acute phase of infection. The lowest concentration of SARS-CoV-2 viral copies this assay can detect is 250 copies / mL. A negative result does not preclude SARS-CoV-2 infection and should not be used as the sole basis for treatment or other patient management decisions.  A negative result may occur with improper specimen collection / handling, submission of specimen other than nasopharyngeal swab, presence of viral mutation(s) within the areas targeted by this assay, and inadequate number of viral copies (<250 copies / mL). A negative result must be combined with clinical observations, patient history, and epidemiological information.  Fact Sheet for Patients:   StrictlyIdeas.no  Fact Sheet for Healthcare Providers: BankingDealers.co.za  This test is not yet approved or  cleared by the [place] FDA and has been authorized for detection  and/or diagnosis of SARS-CoV-2 by FDA under an Emergency Use Authorization (EUA).  This EUA will remain in effect (meaning this test can be used) for the duration of the COVID-19 declaration under Section 564(b)(1) of the Act, 21 U.S.C. section 360bbb-3(b)(1), unless the authorization is terminated or revoked sooner.  Performed at [facility], [address] [place], [place] [address]   Aerobic/Anaerobic Culture (surgical/deep wound)     Status: None (Preliminary result)   Collection Time: 05/16/20 10:20 AM   Specimen: PATH Other; Abscess  Result Value Ref Range Status   Specimen Description  Final    ABSCESS RIGHT ELBOW Performed at [facility], [address], [place], [place] [address]    Special Requests   Final    NONE Performed at [facility], [place], [place] [address]    Gram Stain   Final    RARE WBC PRESENT, PREDOMINANTLY PMN RARE GRAM POSITIVE COCCI IN PAIRS Performed at [facility], [place] [address], [place], [place] [address]    Culture   Final    FEW STAPHYLOCOCCUS AUREUS NO ANAEROBES ISOLATED; CULTURE IN PROGRESS FOR 5 DAYS    Report Status PENDING  Incomplete   Organism ID, Bacteria STAPHYLOCOCCUS AUREUS  Final      Susceptibility   Staphylococcus aureus - MIC*    CIPROFLOXACIN <=0.5 SENSITIVE Sensitive     ERYTHROMYCIN >=8 RESISTANT Resistant     GENTAMICIN <=0.5 SENSITIVE Sensitive     OXACILLIN <=0.25 SENSITIVE Sensitive     TETRACYCLINE >=16 RESISTANT Resistant     VANCOMYCIN 1 SENSITIVE Sensitive     TRIMETH/SULFA <=10 SENSITIVE Sensitive     CLINDAMYCIN <=0.25 SENSITIVE Sensitive     RIFAMPIN <=0.5 SENSITIVE Sensitive     Inducible Clindamycin NEGATIVE Sensitive     * FEW STAPHYLOCOCCUS AUREUS  Aerobic/Anaerobic Culture (surgical/deep wound)     Status: None (Preliminary result)   Collection Time: 05/16/20 11:41 AM   Specimen: PATH Other  Result Value Ref Range Status   Specimen Description    Final    GRAFT RIGHT Performed at [facility], [address], [place], [place] [address]    Special Requests   Final    NONE Performed at [facility], [address] [place], [place] [address]    Gram Stain   Final    RARE WBC PRESENT, PREDOMINANTLY PMN RARE GRAM POSITIVE COCCI IN PAIRS Performed at [facility], [place] [address], [place], [place] [address]    Culture   Final    MODERATE STAPHYLOCOCCUS AUREUS NO ANAEROBES ISOLATED; CULTURE IN PROGRESS FOR 5 DAYS    Report Status PENDING  Incomplete   Organism ID, Bacteria STAPHYLOCOCCUS AUREUS  Final      Susceptibility   Staphylococcus aureus - MIC*    CIPROFLOXACIN <=0.5 SENSITIVE Sensitive     ERYTHROMYCIN >=8 RESISTANT Resistant     GENTAMICIN <=0.5 SENSITIVE Sensitive     OXACILLIN 0.5 SENSITIVE Sensitive     TETRACYCLINE >=16 RESISTANT Resistant     VANCOMYCIN 1 SENSITIVE Sensitive     TRIMETH/SULFA <=10 SENSITIVE Sensitive     CLINDAMYCIN <=0.25 SENSITIVE Sensitive     RIFAMPIN <=0.5 SENSITIVE Sensitive     Inducible Clindamycin NEGATIVE Sensitive     * MODERATE STAPHYLOCOCCUS AUREUS    Coagulation Studies: No results for input(s): LABPROT, INR in the last 72 hours.  Urinalysis: No results for input(s): COLORURINE, LABSPEC, PHURINE, GLUCOSEU, HGBUR, BILIRUBINUR, KETONESUR, PROTEINUR, UROBILINOGEN, NITRITE, LEUKOCYTESUR in the last 72 hours.  Invalid input(s): APPERANCEUR    Imaging: No results found.   Medications:   . sodium chloride 0 mL/hr at 05/16/20 0123  . [START ON 05/21/2020]  ceFAZolin (ANCEF) IV    . [START ON 05/24/2020]  ceFAZolin (ANCEF) IV    .  ceFAZolin (ANCEF) IV Stopped (05/19/20 1347)   . Chlorhexidine Gluconate Cloth  6 each Topical Daily  . epoetin (EPOGEN/PROCRIT) injection  10,000 Units Intravenous Q M,W,F-HD  . ferric citrate  210 mg Oral TID WC  . heparin  5,000 Units  Subcutaneous Q8H  . pantoprazole  40 mg Oral QPM  . Vitamin D (Ergocalciferol)   50,000 Units Oral Q7 days   sodium chloride, acetaminophen **OR** acetaminophen, albuterol, alum & mag hydroxide-simeth, HYDROmorphone (DILAUDID) injection, oxyCODONE, promethazine, promethazine  Assessment/ Plan:  [name] is a 45 y.o.  male with ESRD on HD, GERD, guillain barre, previous CIVD infection, HTN, CHF, depression who was admitted on 05/14/2020 for Cellulitis [L03.90] Abscess of arm, right [L02.413] Status post excision of his right forearm AVG by Dr. Bridgett Larsson on 05/17/2019  [facility] TTS [place]   1. ESRD with hyperkalemia With complication of dialysis device. AVG resection. Now with RIJ temp HD catheter - MWF while inpatient. Seen and examined on hemodialysis treatment. Tolerated treatment well.   2. Anemia of CKD  - EPO with HD treatment.   3. Secondary Hyperparathyroidism:  Not currently on binders - Auryxia with meals.   4. Hypertension: currently not on any blood pressure agents.  5. AVG infection: MSSA - will need 4 weeks of antibiotics. Cefazolin 2g on Tuesdays, 2g on Thursdays and 3g on Saturdays. End date 06/09/20.    LOS: 5 Patrick Downs 1/26/20224:37 PM

## 2020-05-19 NOTE — Progress Notes (Signed)
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 He called vascular clinic and he was referred to ED. He notes no fever/chills/ n/v/d/sob/chest pain, abdominal pain , cough or sore throat. He states he is at his baseline line health other than noted infection of his right arm AVF graft. On evaluation in ed U/s noted no clots, but noted inflammation consistent with cellulitis and could not rule out phlegmon. Patient is admitted for iv antibiotics and vascular follow up.  Vascular surgery was consulted during admission.  Patient underwent excision of right forearm arteriovenous graft on 05/16/2020.  No surgical complications.  Wound was wrapped in Penrose drain left in place.  Penrose drain continues with nonpurulent drainage.  Vascular surgery following for postop checks  Infectious disease followed up with patient.  Wound culture growing MSSA.  Antibiotics switched to cefazolin.  Patient will complete 4-week course.  To be administered with hemodialysis.  Still endorsing pain in RUE   Assessment & Plan:   Principal Problem:   Infection of AV graft for dialysis [facility]) Active Problems:   Cellulitis  Infected AV graft Complicated soft tissue infection of RUE aVF Purulent cellulitis Status post AV graft excision 05/16/2020 Blood cultures with MSSA Plan: Antibiotic switched to Ancef, plan for 4-week course Infectious disease aware Antibiotics to be given with dialysis Pain control prn Appreciate  vascular followup  Acute postoperative blood loss anemia Patient did have drop in hemoglobin Significant bleeding noted from aVF site Currently stable with no overt bleeding noted Plan: Monitor hemoglobin Transfuse as needed goal hemoglobin 7  End-stage renal disease on hemodialysis Hyperkalemia Normal HD TTS Missed hemodialysis Thursday before admission Nephrology consulted from admission has been following Dialysis via chest Vas-Cath Plan: Inpatient HD per nephrology Daily renal function  GERD  started pantoprazole 40 mg twice daily  Chronic pain  - 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    DVT prophylaxis: SCDs Code Status: Full Family Communication: None today Disposition Plan: Status is: Inpatient  Remains inpatient appropriate because:Inpatient level of care appropriate due to severity of illness   Dispo: The patient is from: Home              Anticipated d/c is to: Home              Anticipated d/c date is: 1 day              Patient currently is not medically stable to d/c.   Difficult to place patient No   Patient still with pain/swelling of affected RUE.  Will ensure pain control and stability overnight.  Plan on discharge tomorrow 1/27.  Consultants:   Nephrology  Infectious disease  Vascular surgery  Procedures:   AV graft excision 05/16/2020  Antimicrobials:  Cefazolin   Subjective: Patient seen and examined.  Endorsing pain in RUE.    Objective: Vitals:   05/19/20 1300 05/19/20 1315 05/19/20 1351 05/19/20 1605  BP: 116/88 (!) 118/92 (!) 127/92  112/90  Pulse: 73 72 75 77  Resp: '13 12 16 16  '$ Temp:   97.8 F (36.6 C) 99.2 F (37.3 C)  TempSrc:   Oral Oral  SpO2: 99% 100% 100% 96%  Weight:      Height:        Intake/Output Summary (Last 24 hours) at 05/19/2020 1610 Last data filed at 05/19/2020 1315 Gross per 24 hour  Intake --  Output 2500 ml  Net -2500 ml   Filed Weights   05/14/20 1257 05/15/20 0532  Weight: 120.2 kg 123 kg    Examination:  General exam: Appears calm and comfortable  Respiratory system: Clear to auscultation. Respiratory effort normal. Cardiovascular system: S1 & S2 heard, RRR. No JVD, murmurs, rubs, gallops or clicks. No pedal edema.  Left chest Vas-Cath Gastrointestinal system: Abdomen is nondistended, soft and nontender. No organomegaly or masses felt. Normal bowel sounds heard. Central nervous system: Alert and oriented. No focal neurological deficits. Extremities: RUE in surgical wraps. Skin: No rashes, lesions or ulcers Psychiatry: Judgement and insight appear normal. Mood & affect appropriate.     Data Reviewed: I have personally reviewed following labs and imaging studies  CBC: Recent Labs  Lab 05/14/20 1317 05/14/20 2024 05/16/20 0708 05/17/20 0636 05/18/20 0508 05/19/20 0536 05/19/20 0926  WBC 11.2*   < > 6.2 9.2 9.6 8.0 8.0  NEUTROABS 8.2*  --   --   --   --   --   --   HGB 11.5*   < > 9.7* 9.5* 9.1* 9.0* 8.7*  HCT 35.9*   < > 29.4* 29.3* 28.8* 28.5* 26.9*  MCV 87.3   < > 86.0 87.5 89.4 87.7 87.3  PLT 134*   < > 123* 147* 180 164 151   < > = values in this interval not displayed.   Basic Metabolic Panel: Recent Labs  Lab 05/16/20 0708 05/17/20 0636 05/18/20 0508 05/19/20 0536 05/19/20 0926  NA 140 139 140 140 141  K 5.1 6.1* 4.8 4.5 4.9  CL 97* 100 96* 97* 98  CO2 26 21* '24 24 23  '$ GLUCOSE 95 156* 121* 88 85  BUN 51* 73* 75* 93* 95*  CREATININE 15.63* 17.99* 15.11* 16.92* 17.36*  CALCIUM 7.5* 7.3* 7.2* 7.0* 7.0*  MG 2.1  --   --   --   --   PHOS 5.7*  --   --    --  4.4   GFR: Estimated Creatinine Clearance: 7.6 mL/min (A) (by C-G formula based on SCr of 17.36 mg/dL (H)). Liver Function Tests: Recent Labs  Lab 05/14/20 1317 05/15/20 1051 05/19/20 0926  AST 17 12*  --   ALT 16 15  --   ALKPHOS 60 61  --   BILITOT 0.8 0.8  --   PROT 8.2* 7.8  --   ALBUMIN 3.9 3.4* 3.2*   No results for input(s): LIPASE, AMYLASE in the last 168 hours. No results for input(s): AMMONIA in the last 168 hours. Coagulation Profile: Recent Labs  Lab 05/14/20 1358  INR 1.2   Cardiac Enzymes: No results for input(s): CKTOTAL, CKMB, CKMBINDEX, TROPONINI in the last 168 hours. BNP (last 3  results) No results for input(s): PROBNP in the last 8760 hours. HbA1C: No results for input(s): HGBA1C in the last 72 hours. CBG: No results for input(s): GLUCAP in the last 168 hours. Lipid Profile: No results for input(s): CHOL, HDL, LDLCALC, TRIG, CHOLHDL, LDLDIRECT in the last 72 hours. Thyroid Function Tests: No results for input(s): TSH, T4TOTAL, FREET4, T3FREE, THYROIDAB in the last 72 hours. Anemia Panel: No results for input(s): VITAMINB12, FOLATE, FERRITIN, TIBC, IRON, RETICCTPCT in the last 72 hours. Sepsis Labs: Recent Labs  Lab 05/14/20 1317  LATICACIDVEN 1.5    Recent Results (from the past 240 hour(s))  Blood culture (routine x 2)     Status: None   Collection Time: 05/14/20  1:17 PM   Specimen: BLOOD  Result Value Ref Range Status   Specimen Description BLOOD BLOOD RIGHT FOREARM  Final   Special Requests   Final    BOTTLES DRAWN AEROBIC AND ANAEROBIC Blood Culture adequate volume   Culture   Final    NO GROWTH 5 DAYS Performed at [facility], [address] [place], [place] [address]    Report Status 05/19/2020 FINAL  Final  Blood culture (routine x 2)     Status: None   Collection Time: 05/14/20  1:17 PM   Specimen: BLOOD  Result Value Ref Range Status   Specimen Description BLOOD BLOOD RIGHT HAND  Final   Special Requests    Final    BOTTLES DRAWN AEROBIC AND ANAEROBIC Blood Culture adequate volume   Culture   Final    NO GROWTH 5 DAYS Performed at [facility], [address], [place], [place] [address]    Report Status 05/19/2020 FINAL  Final  MRSA PCR Screening     Status: None   Collection Time: 05/14/20  8:08 PM   Specimen: Nasal Mucosa; Nasopharyngeal  Result Value Ref Range Status   MRSA by PCR NEGATIVE NEGATIVE Final    Comment:        The GeneXpert MRSA Assay (FDA approved for NASAL specimens only), is one component of a comprehensive MRSA colonization surveillance program. It is not intended to diagnose MRSA infection nor to guide or monitor treatment for MRSA infections. Performed at [facility], [address] [place], [place] [address]   SARS Coronavirus 2 by RT PCR (hospital order, performed in [facility] hospital lab) Nasopharyngeal Nasopharyngeal Swab     Status: None   Collection Time: 05/15/20  1:05 AM   Specimen: Nasopharyngeal Swab  Result Value Ref Range Status   SARS Coronavirus 2 NEGATIVE NEGATIVE Final    Comment: (NOTE) SARS-CoV-2 target nucleic acids are NOT DETECTED.  The SARS-CoV-2 RNA is generally detectable in upper and lower respiratory specimens during the acute phase of infection. The lowest concentration of SARS-CoV-2 viral copies this assay can detect is 250 copies / mL. A negative result does not preclude SARS-CoV-2 infection and should not be used as the sole basis for treatment or other patient management decisions.  A negative result may occur with improper specimen collection / handling, submission of specimen other than nasopharyngeal swab, presence of viral mutation(s) within the areas targeted by this assay, and inadequate number of viral copies (<250 copies / mL). A negative result must be combined with clinical observations, patient history, and epidemiological information.  Fact Sheet for Patients:    StrictlyIdeas.no  Fact Sheet for Healthcare Providers: BankingDealers.co.za  This test is not yet approved or  cleared by the [place] FDA and has  been authorized for detection and/or diagnosis of SARS-CoV-2 by FDA under an Emergency Use Authorization (EUA).  This EUA will remain in effect (meaning this test can be used) for the duration of the COVID-19 declaration under Section 564(b)(1) of the Act, 21 U.S.C. section 360bbb-3(b)(1), unless the authorization is terminated or revoked sooner.  Performed at [facility], [address] [place], [place] [address]   Aerobic/Anaerobic Culture (surgical/deep wound)     Status: None (Preliminary result)   Collection Time: 05/16/20 10:20 AM   Specimen: PATH Other; Abscess  Result Value Ref Range Status   Specimen Description   Final    ABSCESS RIGHT ELBOW Performed at [facility], [address] [place], [place] [address]    Special Requests   Final    NONE Performed at [facility], [address] [place], [place] [address]    Gram Stain   Final    RARE WBC PRESENT, PREDOMINANTLY PMN RARE GRAM POSITIVE COCCI IN PAIRS Performed at [facility], [place] [address], [place], [place] [address]    Culture   Final    FEW STAPHYLOCOCCUS AUREUS NO ANAEROBES ISOLATED; CULTURE IN PROGRESS FOR 5 DAYS    Report Status PENDING  Incomplete   Organism ID, Bacteria STAPHYLOCOCCUS AUREUS  Final      Susceptibility   Staphylococcus aureus - MIC*    CIPROFLOXACIN <=0.5 SENSITIVE Sensitive     ERYTHROMYCIN >=8 RESISTANT Resistant     GENTAMICIN <=0.5 SENSITIVE Sensitive     OXACILLIN <=0.25 SENSITIVE Sensitive     TETRACYCLINE >=16 RESISTANT Resistant     VANCOMYCIN 1 SENSITIVE Sensitive     TRIMETH/SULFA <=10 SENSITIVE Sensitive     CLINDAMYCIN <=0.25 SENSITIVE Sensitive     RIFAMPIN <=0.5 SENSITIVE Sensitive     Inducible Clindamycin NEGATIVE Sensitive      * FEW STAPHYLOCOCCUS AUREUS  Aerobic/Anaerobic Culture (surgical/deep wound)     Status: None (Preliminary result)   Collection Time: 05/16/20 11:41 AM   Specimen: PATH Other  Result Value Ref Range Status   Specimen Description   Final    GRAFT RIGHT Performed at [facility], [address], [place], [place] [address]    Special Requests   Final    NONE Performed at [facility], [address] [place], [place] [address]    Gram Stain   Final    RARE WBC PRESENT, PREDOMINANTLY PMN RARE GRAM POSITIVE COCCI IN PAIRS Performed at [facility], [place] [address], [place], [place] [address]    Culture   Final    MODERATE STAPHYLOCOCCUS AUREUS NO ANAEROBES ISOLATED; CULTURE IN PROGRESS FOR 5 DAYS    Report Status PENDING  Incomplete   Organism ID, Bacteria STAPHYLOCOCCUS AUREUS  Final      Susceptibility   Staphylococcus aureus - MIC*    CIPROFLOXACIN <=0.5 SENSITIVE Sensitive     ERYTHROMYCIN >=8 RESISTANT Resistant     GENTAMICIN <=0.5 SENSITIVE Sensitive     OXACILLIN 0.5 SENSITIVE Sensitive     TETRACYCLINE >=16 RESISTANT Resistant     VANCOMYCIN 1 SENSITIVE Sensitive     TRIMETH/SULFA <=10 SENSITIVE Sensitive     CLINDAMYCIN <=0.25 SENSITIVE Sensitive     RIFAMPIN <=0.5 SENSITIVE Sensitive     Inducible Clindamycin NEGATIVE Sensitive     * MODERATE STAPHYLOCOCCUS AUREUS         Radiology Studies: No results found.      Scheduled Meds: . Chlorhexidine Gluconate Cloth  6 each Topical Daily  . epoetin (EPOGEN/PROCRIT) injection  10,000 Units Intravenous Q M,W,F-HD  . ferric citrate  210 mg Oral TID WC  . heparin  5,000 Units Subcutaneous Q8H  . pantoprazole  40 mg Oral QPM  . Vitamin D (Ergocalciferol)  50,000 Units Oral Q7 days   Continuous Infusions: . sodium chloride 0 mL/hr at 05/16/20 0123  . [START ON 05/21/2020]  ceFAZolin (ANCEF) IV    . [START ON 05/24/2020]  ceFAZolin (ANCEF) IV    .  ceFAZolin (ANCEF) IV Stopped  (05/19/20 1347)     LOS: 5 days    Time spent: 15 minutes    Sidney Ace, MD [facility] Pager [phone]  If 7PM-7AM, please contact night-coverage 05/19/2020, 4:10 PM

## 2020-05-19 NOTE — Progress Notes (Signed)
45 year old male with a history of end-stage renal disease, multiple access placement failure, hypertension, treated MRSA infection of the right foot, bilateral fifth toe amputation, Guillain-Barr syndrome presented with infected right forearm AV graft which was placed on 04/08/2020.  The graft was removed on 05/16/2020.  Culture of the infected right.  Aureus.  Patient is currently on cefazolin. Date of Admission:  05/14/2020   T    Subjective: Pt says he has pain rt forearm at the site of surgery Also c/o firm swelling arm Has knots on his abdomen at the site of heparin [place] No fever appetite good  Medications:  . Chlorhexidine Gluconate Cloth  6 each Topical Daily  . epoetin (EPOGEN/PROCRIT) injection  10,000 Units Intravenous Q M,W,F-HD  . ferric citrate  210 mg Oral TID WC  . heparin  5,000 Units Subcutaneous Q8H  . pantoprazole  40 mg Oral QPM  . Vitamin D (Ergocalciferol)  50,000 Units Oral Q7 days    Objective: Vital signs in last 24 hours: Temp:  [97.6 F (36.4 C)-98.6 F (37 C)] 98.6 F (37 C) (01/26 1020) Pulse Rate:  [68-81] 73 (01/26 1300) Resp:  [10-18] 13 (01/26 1300) BP: (100-158)/(78-128) 116/88 (01/26 1300) SpO2:  [94 %-100 %] 99 % (01/26 1300)  PHYSICAL EXAM:  General: Alert, cooperative, no distress, appears stated age.  Head: Normocephalic, without obvious abnormality, atraumatic. Eyes: Conjunctivae clear, anicteric sclerae. Pupils are equal ENT Nares normal. No drainage or sinus tenderness. Lips, mucosa, and tongue normal. No Thrush Neck: Supple, Lungs: b/la ir entry Heart: s1s2 Abdomen: Soft,  nodules at the site of heparin shots Extremities: rt arm some edema Rt forearm dressing not removed Skin: No rashes or lesions. Or bruising Lymph: Cervical, supraclavicular normal. Neurologic: Wasted interosseous muscles in the hands with claw fingers Lab Results Recent Labs    05/19/20 0536 05/19/20 0926  WBC 8.0 8.0  HGB 9.0* 8.7*  HCT 28.5* 26.9*   NA 140 141  K 4.5 4.9  CL 97* 98  CO2 24 23  BUN 93* 95*  CREATININE 16.92* 17.36*   Liver Panel Recent Labs    05/19/20 0926  ALBUMIN 3.2*   Sedimentation Rate No results for input(s): ESRSEDRATE in the last 72 hours. C-Reactive Protein No results for input(s): CRP in the last 72 hours.  Microbiology: AV graft culture- MSSA  Studies/Results: No results found.   Assessment/Plan: Infected AV graft of right extremity status post removal.  Staph aureus infection.  Blood culture negative for staph aureus. On cefazolin with need for 4 weeks.  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

## 2020-05-20 LAB — CBC
HCT: 28.1 % — ABNORMAL LOW (ref 39.0–52.0)
Hemoglobin: 8.9 g/dL — ABNORMAL LOW (ref 13.0–17.0)
MCH: 28.1 pg (ref 26.0–34.0)
MCHC: 31.7 g/dL (ref 30.0–36.0)
MCV: 88.6 fL (ref 80.0–100.0)
Platelets: 160 10*3/uL (ref 150–400)
RBC: 3.17 MIL/uL — ABNORMAL LOW (ref 4.22–5.81)
RDW: 16.4 % — ABNORMAL HIGH (ref 11.5–15.5)
WBC: 6.8 10*3/uL (ref 4.0–10.5)
nRBC: 0 % (ref 0.0–0.2)

## 2020-05-20 LAB — BASIC METABOLIC PANEL
Anion gap: 15 (ref 5–15)
BUN: 68 mg/dL — ABNORMAL HIGH (ref 6–20)
CO2: 25 mmol/L (ref 22–32)
Calcium: 7.4 mg/dL — ABNORMAL LOW (ref 8.9–10.3)
Chloride: 98 mmol/L (ref 98–111)
Creatinine, Ser: 13.61 mg/dL — ABNORMAL HIGH (ref 0.61–1.24)
GFR, Estimated: 4 mL/min — ABNORMAL LOW (ref >60)
Glucose, Bld: 91 mg/dL (ref 70–99)
Potassium: 4.8 mmol/L (ref 3.5–5.1)
Sodium: 138 mmol/L (ref 135–145)

## 2020-05-20 MED ORDER — OXYCODONE-ACETAMINOPHEN 5-325 MG PO TABS: 2.0000 | ORAL_TABLET | Freq: Four times a day (QID) | ORAL | Status: DC | PRN

## 2020-05-20 MED ORDER — OXYCODONE-ACETAMINOPHEN 5-325 MG PO TABS
2.0000 | ORAL_TABLET | ORAL | Status: DC | PRN
  Administered 2020-05-20: 2 via ORAL
  Filled 2020-05-20: qty 2

## 2020-05-20 MED ORDER — DIPHENHYDRAMINE HCL 25 MG PO CAPS
25.0000 mg | ORAL_CAPSULE | Freq: Four times a day (QID) | ORAL | Status: DC | PRN
  Administered 2020-05-20: 25 mg via ORAL
  Filled 2020-05-20: qty 1

## 2020-05-20 MED ORDER — VITAMIN D (ERGOCALCIFEROL) 1.25 MG (50000 UNIT) PO CAPS: 50000.0000 [IU] | ORAL_CAPSULE | ORAL | 0 refills | Status: AC

## 2020-05-20 NOTE — Discharge Instructions (Signed)
Vascular Surgery Discharge Instructions:  1) Please keep your right upper extremity clean and dry.  It is okay to wash the arm and the incisions with soap and water.  Gently pat dry. 2) ABD to old drain site.  Covered with Kerlix.  Covered with either Coban or an Ace.

## 2020-05-20 NOTE — Discharge Summary (Signed)
Physician Discharge Summary  [name] [id number] DOB: [birth year] DOA: 05/14/2020  PCP: Baxter Hire, MD  Admit date: 05/14/2020 Discharge date: 05/20/2020  Admitted From: Home Disposition:  Home with home health  Recommendations for Outpatient Follow-up:  1. Follow up with PCP in 1-2 weeks 2. Follow up with vascular in 1 week 3. 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 He called vascular clinic and he was referred to ED. He notes no fever/chills/ n/v/d/sob/chest pain, abdominal pain , cough or sore throat. He states he is at his baseline line health other than noted infection of his right arm AVF graft. On evaluation in ed U/s noted no clots, but noted inflammation consistent with cellulitis and could not rule out phlegmon. Patient is admitted for iv antibiotics and vascular follow up.  Vascular surgery was consulted during admission.  Patient underwent excision of right forearm arteriovenous graft on 05/16/2020.  No surgical complications.  Wound was wrapped in Penrose drain left in place.  Penrose drain continues with nonpurulent drainage.  Vascular surgery following for postop checks  Infectious disease followed up with patient.  Wound culture growing MSSA.  Antibiotics switched to cefazolin.  Patient will complete 4-week course.  To be administered with hemodialysis.  On day of discharge patient was seen and evaluated by  vascular surgery.  Penrose drain has been pulled.  Drainage from wound is decreased.  Wound care instructions provided.  Home health wound care also ordered.  Patient on chronic oxycodone will continue this at time of discharge.  Discharge Diagnoses:  Principal Problem:   Infection of AV graft for dialysis [facility]) Active Problems:   Cellulitis  Infected AV graft Complicated soft tissue infection of RUE aVF Purulent cellulitis Status post AV graft excision 05/16/2020 Blood cultures with MSSA Plan: Antibiotic switched to Ancef, plan for 4-week course Infectious disease aware Antibiotics to be given with dialysis Resume outpatient hemodialysis schedule HD via left chest Vas-Cath  Acute postoperative blood loss anemia Patient did have drop in hemoglobin Significant bleeding noted from aVF site Stable at time of discharge No further bleeding a wound check on day of discharge  End-stage renal disease on hemodialysis Hyperkalemia Normal HD TTS Missed hemodialysis Thursday before admission Nephrology consulted from admission has been following Dialysis via chest Vas-Cath Patient received IV antibiotics with dialysis  GERD  started pantoprazole 40 mg twice daily  Chronic pain  - Guillain Barre remote with sequella of b/l foot drop -continue home regimen   hx of COVID 19 infection - s/p recovery - patient remains unvaccinated  HTN -prior hx but now of medications  -pt notes intermittent rare hypotension with prn use of midodrine -note use of this med is rare  CHF  -diastolic no active issues   Depression No active issue currently not medications  Discharge Instructions  Discharge Instructions    Diet - low sodium heart healthy   Complete by: As directed    Increase activity slowly   Complete by: As directed    No wound care  Complete by: As directed      Allergies as of 05/20/2020      Reactions   Hepatitis B Vaccine    Other reaction(s): Unknown    Ondansetron Other (See Comments)   Stomach pain    Minoxidil Other (See Comments)   "put fluid around my heart", PERICARDIAL EFFUSION   Morphine And Related Other (See Comments)   Aggressive    Omnipaque [iohexol] Itching, Other (See Comments)   Rigors on one occasion, widespread itching on a separate occasion (resolved with Benadryl), tremors      Medication List    TAKE these medications   acetaminophen 500 MG tablet Commonly known as: TYLENOL Take 1,000-1,500 mg by mouth 2 (two) times daily as needed for moderate pain or headache.   Auryxia 1 GM 210 MG(Fe) tablet Generic drug: ferric citrate Take 420 mg by mouth 3 (three) times daily.   gabapentin 300 MG capsule Commonly known as: NEURONTIN Take 600 mg by mouth at bedtime.   midodrine 5 MG tablet Commonly known as: PROAMATINE Take 5-10 mg by mouth See admin instructions. Take 10 mg by mouth before dialysis on Tuesdays, Thursdays, & Saturday and take 5 mg in the evening on  Tuesdays, Thursdays, & Saturdays. On Monday, Wednesday, Friday and Sunday 5 mg by mouth every 8 hours.   oxyCODONE-acetaminophen 10-325 MG tablet Commonly known as: Percocet Take 1 tablet by mouth every 6 (six) hours as needed for pain.   oxyCODONE-acetaminophen 10-325 MG tablet Commonly known as: Percocet Take 1 tablet by mouth every 6 (six) hours as needed for pain. Start taking on: June 10, 2020   Vitamin D (Ergocalciferol) 1.25 MG (50000 UNIT) Caps capsule Commonly known as: DRISDOL Take 1 capsule (50,000 Units total) by mouth every 7 (seven) days for 7 doses. Start taking on: May 23, 2020       Follow-up Information    Kris Hartmann, NP On 05/27/2020.   Specialty: Vascular Surgery Why: First postoperative wound check. No studies.10:30am appointment Contact information: [place] [address] [phone]              Allergies  Allergen Reactions  . Hepatitis B Vaccine     Other reaction(s): Unknown  .  Ondansetron Other (See Comments)    Stomach pain   . Minoxidil Other (See Comments)    "put fluid around my heart", PERICARDIAL EFFUSION  . Morphine And Related Other (See Comments)    Aggressive   . Omnipaque [Iohexol] Itching and Other (See Comments)    Rigors on one occasion, widespread itching on a separate occasion (resolved with Benadryl), tremors    Consultations:  Nephrology  Vascular surgery   Procedures/Studies: DG Chest Port 1 View  Result Date: 05/14/2020 CLINICAL DATA:  Sepsis. EXAM: PORTABLE CHEST 1 VIEW COMPARISON:  January 25, 2020. FINDINGS: The heart size and mediastinal contours are within normal limits. Both lungs are clear. No pneumothorax or pleural effusion is noted. Right internal jugular dialysis catheter is unchanged in position. The visualized skeletal structures are unremarkable. IMPRESSION: No active disease. Electronically Signed   By: Marijo Conception M.D.   On: 05/14/2020 13:40   [place] RT UPPER EXTREM LTD SOFT TISSUE NON VASCULAR  Result Date: 05/14/2020 CLINICAL DATA:  Right arm abscess status post AV fistula EXAM: ULTRASOUND RIGHT UPPER EXTREMITY LIMITED TECHNIQUE: Ultrasound examination of the upper extremity soft tissues was performed in the area of clinical concern. COMPARISON:  None. FINDINGS: Status post AV fistula creation. Severe soft  tissue edema around the [facility] fistula site. Hypoechoic areas around the fistula which may reflect severe cellulitis or phlegmon. Flow is seen throughout the vessels of the fistula 0 without thrombosis. No other solid or cystic mass. IMPRESSION: Severe soft tissue edema around the [facility] fistula site. Hypoechoic areas around the fistula which may reflect severe cellulitis or phlegmon. If there is further clinical concern, recommend a CT of the arm with contrast. Electronically Signed   By: Kathreen Devoid   On: 05/14/2020 16:11    (Echo, Carotid, EGD, Colonoscopy, ERCP)    Subjective: Patient seen and examined on the day of  discharge.  No distress.  Answered all questions about wound care at home.  Seen by vascular surgery and nephrology on day of discharge.  Discharge Exam: Vitals:   05/20/20 0532 05/20/20 0813  BP: 113/82 116/83  Pulse: 75 70  Resp: 18 17  Temp: 98 F (36.7 C) 97.7 F (36.5 C)  SpO2: 99% 99%   Vitals:   05/20/20 0009 05/20/20 0500 05/20/20 0532 05/20/20 0813  BP: 117/86  113/82 116/83  Pulse: 81  75 70  Resp: '18  18 17  '$ Temp: 98.3 F (36.8 C)  98 F (36.7 C) 97.7 F (36.5 C)  TempSrc: Oral  Oral Oral  SpO2: 100%  99% 99%  Weight:  122.8 kg    Height:        General: Pt is alert, awake, not in acute distress Cardiovascular: RRR, S1/S2 +, no rubs, no gallops Respiratory: CTA bilaterally, no wheezing, no rhonchi Abdominal: Soft, NT, ND, bowel sounds + Extremities: Right upper extremity in cast, minimal pain, left chest Vas-Cath    The results of significant diagnostics from this hospitalization (including imaging, microbiology, ancillary and laboratory) are listed below for reference.     Microbiology: Recent Results (from the past 240 hour(s))  Blood culture (routine x 2)     Status: None   Collection Time: 05/14/20  1:17 PM   Specimen: BLOOD  Result Value Ref Range Status   Specimen Description BLOOD BLOOD RIGHT FOREARM  Final   Special Requests   Final    BOTTLES DRAWN AEROBIC AND ANAEROBIC Blood Culture adequate volume   Culture   Final    NO GROWTH 5 DAYS Performed at [facility], [address] [place], [place] [address]    Report Status 05/19/2020 FINAL  Final  Blood culture (routine x 2)     Status: None   Collection Time: 05/14/20  1:17 PM   Specimen: BLOOD  Result Value Ref Range Status   Specimen Description BLOOD BLOOD RIGHT HAND  Final   Special Requests   Final    BOTTLES DRAWN AEROBIC AND ANAEROBIC Blood Culture adequate volume   Culture   Final    NO GROWTH 5 DAYS Performed at [facility], [address]  [place], [place] [address]    Report Status 05/19/2020 FINAL  Final  MRSA PCR Screening     Status: None   Collection Time: 05/14/20  8:08 PM   Specimen: Nasal Mucosa; Nasopharyngeal  Result Value Ref Range Status   MRSA by PCR NEGATIVE NEGATIVE Final    Comment:        The GeneXpert MRSA Assay (FDA approved for NASAL specimens only), is one component of a comprehensive MRSA colonization surveillance program. It is not intended to diagnose MRSA infection nor to guide or monitor treatment for MRSA infections. Performed at [facility], [address] [place],  [place] [address]   SARS Coronavirus 2 by RT PCR (hospital order, performed in [facility] hospital lab) Nasopharyngeal Nasopharyngeal Swab     Status: None   Collection Time: 05/15/20  1:05 AM   Specimen: Nasopharyngeal Swab  Result Value Ref Range Status   SARS Coronavirus 2 NEGATIVE NEGATIVE Final    Comment: (NOTE) SARS-CoV-2 target nucleic acids are NOT DETECTED.  The SARS-CoV-2 RNA is generally detectable in upper and lower respiratory specimens during the acute phase of infection. The lowest concentration of SARS-CoV-2 viral copies this assay can detect is 250 copies / mL. A negative result does not preclude SARS-CoV-2 infection and should not be used as the sole basis for treatment or other patient management decisions.  A negative result may occur with improper specimen collection / handling, submission of specimen other than nasopharyngeal swab, presence of viral mutation(s) within the areas targeted by this assay, and inadequate number of viral copies (<250 copies / mL). A negative result must be combined with clinical observations, patient history, and epidemiological information.  Fact Sheet for Patients:   StrictlyIdeas.no  Fact Sheet for Healthcare Providers: BankingDealers.co.za  This test is not yet approved or  cleared by the [place] FDA  and has been authorized for detection and/or diagnosis of SARS-CoV-2 by FDA under an Emergency Use Authorization (EUA).  This EUA will remain in effect (meaning this test can be used) for the duration of the COVID-19 declaration under Section 564(b)(1) of the Act, 21 U.S.C. section 360bbb-3(b)(1), unless the authorization is terminated or revoked sooner.  Performed at [facility], [address] [place], [place] [address]   Aerobic/Anaerobic Culture (surgical/deep wound)     Status: None (Preliminary result)   Collection Time: 05/16/20 10:20 AM   Specimen: PATH Other; Abscess  Result Value Ref Range Status   Specimen Description   Final    ABSCESS RIGHT ELBOW Performed at [facility], [address], [place], [place] [address]    Special Requests   Final    NONE Performed at [facility], [address] [place], [place] [address]    Gram Stain   Final    RARE WBC PRESENT, PREDOMINANTLY PMN RARE GRAM POSITIVE COCCI IN PAIRS Performed at [facility], [place] [address] [place], [place] [address]    Culture   Final    FEW STAPHYLOCOCCUS AUREUS NO ANAEROBES ISOLATED; CULTURE IN PROGRESS FOR 5 DAYS    Report Status PENDING  Incomplete   Organism ID, Bacteria STAPHYLOCOCCUS AUREUS  Final      Susceptibility   Staphylococcus aureus - MIC*    CIPROFLOXACIN <=0.5 SENSITIVE Sensitive     ERYTHROMYCIN >=8 RESISTANT Resistant     GENTAMICIN <=0.5 SENSITIVE Sensitive     OXACILLIN <=0.25 SENSITIVE Sensitive     TETRACYCLINE >=16 RESISTANT Resistant     VANCOMYCIN 1 SENSITIVE Sensitive     TRIMETH/SULFA <=10 SENSITIVE Sensitive     CLINDAMYCIN <=0.25 SENSITIVE Sensitive     RIFAMPIN <=0.5 SENSITIVE Sensitive     Inducible Clindamycin NEGATIVE Sensitive     * FEW STAPHYLOCOCCUS AUREUS  Aerobic/Anaerobic Culture (surgical/deep wound)     Status: None (Preliminary result)   Collection Time: 05/16/20 11:41 AM   Specimen: PATH Other  Result Value  Ref Range Status   Specimen Description   Final    GRAFT RIGHT Performed at [facility], [address], [place], [place] [address]    Special Requests   Final  NONE Performed at [facility], [place], [place] [address]    Gram Stain   Final    RARE WBC PRESENT, PREDOMINANTLY PMN RARE GRAM POSITIVE COCCI IN PAIRS Performed at [facility], [place] [address], [place], [place] [address]    Culture   Final    MODERATE STAPHYLOCOCCUS AUREUS NO ANAEROBES ISOLATED; CULTURE IN PROGRESS FOR 5 DAYS    Report Status PENDING  Incomplete   Organism ID, Bacteria STAPHYLOCOCCUS AUREUS  Final      Susceptibility   Staphylococcus aureus - MIC*    CIPROFLOXACIN <=0.5 SENSITIVE Sensitive     ERYTHROMYCIN >=8 RESISTANT Resistant     GENTAMICIN <=0.5 SENSITIVE Sensitive     OXACILLIN 0.5 SENSITIVE Sensitive     TETRACYCLINE >=16 RESISTANT Resistant     VANCOMYCIN 1 SENSITIVE Sensitive     TRIMETH/SULFA <=10 SENSITIVE Sensitive     CLINDAMYCIN <=0.25 SENSITIVE Sensitive     RIFAMPIN <=0.5 SENSITIVE Sensitive     Inducible Clindamycin NEGATIVE Sensitive     * MODERATE STAPHYLOCOCCUS AUREUS     Labs: BNP (last 3 results) Recent Labs    01/25/20 0944  BNP [id number]*   Basic Metabolic Panel: Recent Labs  Lab 05/16/20 0708 05/17/20 0636 05/18/20 0508 05/19/20 0536 05/19/20 0926 05/20/20 0542  NA 140 139 140 140 141 138  K 5.1 6.1* 4.8 4.5 4.9 4.8  CL 97* 100 96* 97* 98 98  CO2 26 21* '24 24 23 25  '$ GLUCOSE 95 156* 121* 88 85 91  BUN 51* 73* 75* 93* 95* 68*  CREATININE 15.63* 17.99* 15.11* 16.92* 17.36* 13.61*  CALCIUM 7.5* 7.3* 7.2* 7.0* 7.0* 7.4*  MG 2.1  --   --   --   --   --   PHOS 5.7*  --   --   --  4.4  --    Liver Function Tests: Recent Labs  Lab 05/14/20 1317 05/15/20 1051 05/19/20 0926  AST 17 12*  --   ALT 16 15  --   ALKPHOS 60 61  --   BILITOT 0.8 0.8  --   PROT 8.2* 7.8  --   ALBUMIN 3.9 3.4* 3.2*   No results for  input(s): LIPASE, AMYLASE in the last 168 hours. No results for input(s): AMMONIA in the last 168 hours. CBC: Recent Labs  Lab 05/14/20 1317 05/14/20 2024 05/17/20 0636 05/18/20 0508 05/19/20 0536 05/19/20 0926 05/20/20 0542  WBC 11.2*   < > 9.2 9.6 8.0 8.0 6.8  NEUTROABS 8.2*  --   --   --   --   --   --   HGB 11.5*   < > 9.5* 9.1* 9.0* 8.7* 8.9*  HCT 35.9*   < > 29.3* 28.8* 28.5* 26.9* 28.1*  MCV 87.3   < > 87.5 89.4 87.7 87.3 88.6  PLT 134*   < > 147* 180 164 151 160   < > = values in this interval not displayed.   Cardiac Enzymes: No results for input(s): CKTOTAL, CKMB, CKMBINDEX, TROPONINI in the last 168 hours. BNP: Invalid input(s): POCBNP CBG: No results for input(s): GLUCAP in the last 168 hours. D-Dimer No results for input(s): DDIMER in the last 72 hours. Hgb A1c No results for input(s): HGBA1C in the last 72 hours. Lipid Profile No results for input(s): CHOL, HDL, LDLCALC, TRIG, CHOLHDL, LDLDIRECT in the last 72 hours. Thyroid function studies No results for input(s): TSH, T4TOTAL, T3FREE, THYROIDAB in the last  72 hours.  Invalid input(s): FREET3 Anemia work up No results for input(s): VITAMINB12, FOLATE, FERRITIN, TIBC, IRON, RETICCTPCT in the last 72 hours. Urinalysis No results found for: COLORURINE, APPEARANCEUR, [place], [place], GLUCOSEU, [place], [place], KETONESUR, [place], UROBILINOGEN, NITRITE, LEUKOCYTESUR Sepsis Labs Invalid input(s): PROCALCITONIN,  WBC,  LACTICIDVEN Microbiology Recent Results (from the past 240 hour(s))  Blood culture (routine x 2)     Status: None   Collection Time: 05/14/20  1:17 PM   Specimen: BLOOD  Result Value Ref Range Status   Specimen Description BLOOD BLOOD RIGHT FOREARM  Final   Special Requests   Final    BOTTLES DRAWN AEROBIC AND ANAEROBIC Blood Culture adequate volume   Culture   Final    NO GROWTH 5 DAYS Performed at [facility], [address] [place], [place] [address]    Report Status  05/19/2020 FINAL  Final  Blood culture (routine x 2)     Status: None   Collection Time: 05/14/20  1:17 PM   Specimen: BLOOD  Result Value Ref Range Status   Specimen Description BLOOD BLOOD RIGHT HAND  Final   Special Requests   Final    BOTTLES DRAWN AEROBIC AND ANAEROBIC Blood Culture adequate volume   Culture   Final    NO GROWTH 5 DAYS Performed at [facility], [address] [place], [place] [address]    Report Status 05/19/2020 FINAL  Final  MRSA PCR Screening     Status: None   Collection Time: 05/14/20  8:08 PM   Specimen: Nasal Mucosa; Nasopharyngeal  Result Value Ref Range Status   MRSA by PCR NEGATIVE NEGATIVE Final    Comment:        The GeneXpert MRSA Assay (FDA approved for NASAL specimens only), is one component of a comprehensive MRSA colonization surveillance program. It is not intended to diagnose MRSA infection nor to guide or monitor treatment for MRSA infections. Performed at [facility], [address] [place], [place] [address]   SARS Coronavirus 2 by RT PCR (hospital order, performed in [facility] hospital lab) Nasopharyngeal Nasopharyngeal Swab     Status: None   Collection Time: 05/15/20  1:05 AM   Specimen: Nasopharyngeal Swab  Result Value Ref Range Status   SARS Coronavirus 2 NEGATIVE NEGATIVE Final    Comment: (NOTE) SARS-CoV-2 target nucleic acids are NOT DETECTED.  The SARS-CoV-2 RNA is generally detectable in upper and lower respiratory specimens during the acute phase of infection. The lowest concentration of SARS-CoV-2 viral copies this assay can detect is 250 copies / mL. A negative result does not preclude SARS-CoV-2 infection and should not be used as the sole basis for treatment or other patient management decisions.  A negative result may occur with improper specimen collection / handling, submission of specimen other than nasopharyngeal swab, presence of viral mutation(s) within the areas targeted by this  assay, and inadequate number of viral copies (<250 copies / mL). A negative result must be combined with clinical observations, patient history, and epidemiological information.  Fact Sheet for Patients:   StrictlyIdeas.no  Fact Sheet for Healthcare Providers: BankingDealers.co.za  This test is not yet approved or  cleared by the [place] FDA and has been authorized for detection and/or diagnosis of SARS-CoV-2 by FDA under an Emergency Use Authorization (EUA).  This EUA will remain in effect (meaning this test can be used) for the duration of the COVID-19 declaration under Section 564(b)(1) of the Act, 21 U.S.C. section 360bbb-3(b)(1), unless  the authorization is terminated or revoked sooner.  Performed at [facility], [address] [place], [place] [address]   Aerobic/Anaerobic Culture (surgical/deep wound)     Status: None (Preliminary result)   Collection Time: 05/16/20 10:20 AM   Specimen: PATH Other; Abscess  Result Value Ref Range Status   Specimen Description   Final    ABSCESS RIGHT ELBOW Performed at [facility], [address] [place], [place] [address]    Special Requests   Final    NONE Performed at [facility], [address] [place], [place] [address]    Gram Stain   Final    RARE WBC PRESENT, PREDOMINANTLY PMN RARE GRAM POSITIVE COCCI IN PAIRS Performed at [facility], [place] [address], [place], [place] [address]    Culture   Final    FEW STAPHYLOCOCCUS AUREUS NO ANAEROBES ISOLATED; CULTURE IN PROGRESS FOR 5 DAYS    Report Status PENDING  Incomplete   Organism ID, Bacteria STAPHYLOCOCCUS AUREUS  Final      Susceptibility   Staphylococcus aureus - MIC*    CIPROFLOXACIN <=0.5 SENSITIVE Sensitive     ERYTHROMYCIN >=8 RESISTANT Resistant     GENTAMICIN <=0.5 SENSITIVE Sensitive     OXACILLIN <=0.25 SENSITIVE Sensitive     TETRACYCLINE >=16 RESISTANT Resistant      VANCOMYCIN 1 SENSITIVE Sensitive     TRIMETH/SULFA <=10 SENSITIVE Sensitive     CLINDAMYCIN <=0.25 SENSITIVE Sensitive     RIFAMPIN <=0.5 SENSITIVE Sensitive     Inducible Clindamycin NEGATIVE Sensitive     * FEW STAPHYLOCOCCUS AUREUS  Aerobic/Anaerobic Culture (surgical/deep wound)     Status: None (Preliminary result)   Collection Time: 05/16/20 11:41 AM   Specimen: PATH Other  Result Value Ref Range Status   Specimen Description   Final    GRAFT RIGHT Performed at [facility], [address] [place], [place] [address]    Special Requests   Final    NONE Performed at [facility], [address] [place], [place] [address]    Gram Stain   Final    RARE WBC PRESENT, PREDOMINANTLY PMN RARE GRAM POSITIVE COCCI IN PAIRS Performed at [facility], [place] [address], [place], [place] [address]    Culture   Final    MODERATE STAPHYLOCOCCUS AUREUS NO ANAEROBES ISOLATED; CULTURE IN PROGRESS FOR 5 DAYS    Report Status PENDING  Incomplete   Organism ID, Bacteria STAPHYLOCOCCUS AUREUS  Final      Susceptibility   Staphylococcus aureus - MIC*    CIPROFLOXACIN <=0.5 SENSITIVE Sensitive     ERYTHROMYCIN >=8 RESISTANT Resistant     GENTAMICIN <=0.5 SENSITIVE Sensitive     OXACILLIN 0.5 SENSITIVE Sensitive     TETRACYCLINE >=16 RESISTANT Resistant     VANCOMYCIN 1 SENSITIVE Sensitive     TRIMETH/SULFA <=10 SENSITIVE Sensitive     CLINDAMYCIN <=0.25 SENSITIVE Sensitive     RIFAMPIN <=0.5 SENSITIVE Sensitive     Inducible Clindamycin NEGATIVE Sensitive     * MODERATE STAPHYLOCOCCUS AUREUS     Time coordinating discharge: Over 30 minutes  SIGNED:   Sidney Ace, MD  [facility] 05/20/2020, 1:53 PM Pager   If 7PM-7AM, please contact night-coverage

## 2020-05-20 NOTE — Care Management Important Message (Signed)
Important Message  Patient Details  Name: Patrick Downs MRN: [id number] Date of Birth: 09-11-1975   Medicare Important Message Given:  Yes     Dannette Barbara 05/20/2020, 1:49 PM

## 2020-05-20 NOTE — Progress Notes (Signed)
[facility]  ROUNDING NOTE   Subjective:   Hemodialysis treatment yesterday. Tolerated treatment well.  UF of 2.5 liters.   Objective:  Vital signs in last 24 hours:  Temp:  [97.5 F (36.4 C)-99.2 F (37.3 C)] 97.7 F (36.5 C) (01/27 0813) Pulse Rate:  [70-81] 70 (01/27 0813) Resp:  [12-18] 17 (01/27 0813) BP: (112-133)/(78-103) 116/83 (01/27 0813) SpO2:  [94 %-100 %] 99 % (01/27 0813) Weight:  [122.8 kg] 122.8 kg (01/27 0500)  Weight change:  Filed Weights   05/14/20 1257 05/15/20 0532 05/20/20 0500  Weight: 120.2 kg 123 kg 122.8 kg    Intake/Output: I/O last 3 completed shifts: In: 0  Out: 2500 [Other:2500]   Intake/Output this shift:  No intake/output data recorded.  Physical Exam: General: NAD, sitting up in bed  Head: Normocephalic, atraumatic. Moist oral mucosal membranes  Eyes: Anicteric, PERRL  Neck: Supple, trachea midline  Lungs:  Clear to auscultation  Heart: Regular rate and rhythm  Abdomen:  Soft, nontender,   Extremities:  no edema, right arm in clean and dry dresssings  Neurologic: Nonfocal, moving all four extremities  Skin: Dressings clear and dry of right upper extremity  Access: Right IJ catheter    Basic Metabolic Panel: Recent Labs  Lab 05/16/20 0708 05/17/20 0636 05/18/20 0508 05/19/20 0536 05/19/20 0926 05/20/20 0542  NA 140 139 140 140 141 138  K 5.1 6.1* 4.8 4.5 4.9 4.8  CL 97* 100 96* 97* 98 98  CO2 26 21* '24 24 23 25  '$ GLUCOSE 95 156* 121* 88 85 91  BUN 51* 73* 75* 93* 95* 68*  CREATININE 15.63* 17.99* 15.11* 16.92* 17.36* 13.61*  CALCIUM 7.5* 7.3* 7.2* 7.0* 7.0* 7.4*  MG 2.1  --   --   --   --   --   PHOS 5.7*  --   --   --  4.4  --     Liver Function Tests: Recent Labs  Lab 05/14/20 1317 05/15/20 1051 05/19/20 0926  AST 17 12*  --   ALT 16 15  --   ALKPHOS 60 61  --   BILITOT 0.8 0.8  --   PROT 8.2* 7.8  --   ALBUMIN 3.9 3.4* 3.2*   No results for input(s): LIPASE, AMYLASE in the last 168  hours. No results for input(s): AMMONIA in the last 168 hours.  CBC: Recent Labs  Lab 05/14/20 1317 05/14/20 2024 05/17/20 0636 05/18/20 0508 05/19/20 0536 05/19/20 0926 05/20/20 0542  WBC 11.2*   < > 9.2 9.6 8.0 8.0 6.8  NEUTROABS 8.2*  --   --   --   --   --   --   HGB 11.5*   < > 9.5* 9.1* 9.0* 8.7* 8.9*  HCT 35.9*   < > 29.3* 28.8* 28.5* 26.9* 28.1*  MCV 87.3   < > 87.5 89.4 87.7 87.3 88.6  PLT 134*   < > 147* 180 164 151 160   < > = values in this interval not displayed.    Cardiac Enzymes: No results for input(s): CKTOTAL, CKMB, CKMBINDEX, TROPONINI in the last 168 hours.  BNP: Invalid input(s): POCBNP  CBG: No results for input(s): GLUCAP in the last 168 hours.  Microbiology: Results for orders placed or performed during the hospital encounter of 05/14/20  Blood culture (routine x 2)     Status: None   Collection Time: 05/14/20  1:17 PM   Specimen: BLOOD  Result Value Ref Range Status  Specimen Description BLOOD BLOOD RIGHT FOREARM  Final   Special Requests   Final    BOTTLES DRAWN AEROBIC AND ANAEROBIC Blood Culture adequate volume   Culture   Final    NO GROWTH 5 DAYS Performed at [facility], [address] [place], [place] [address]    Report Status 05/19/2020 FINAL  Final  Blood culture (routine x 2)     Status: None   Collection Time: 05/14/20  1:17 PM   Specimen: BLOOD  Result Value Ref Range Status   Specimen Description BLOOD BLOOD RIGHT HAND  Final   Special Requests   Final    BOTTLES DRAWN AEROBIC AND ANAEROBIC Blood Culture adequate volume   Culture   Final    NO GROWTH 5 DAYS Performed at [facility], [address], [place], [place] [address]    Report Status 05/19/2020 FINAL  Final  MRSA PCR Screening     Status: None   Collection Time: 05/14/20  8:08 PM   Specimen: Nasal Mucosa; Nasopharyngeal  Result Value Ref Range Status   MRSA by PCR NEGATIVE NEGATIVE Final    Comment:        The GeneXpert MRSA Assay  (FDA approved for NASAL specimens only), is one component of a comprehensive MRSA colonization surveillance program. It is not intended to diagnose MRSA infection nor to guide or monitor treatment for MRSA infections. Performed at [facility], [address] [place], [place] [address]   SARS Coronavirus 2 by RT PCR (hospital order, performed in [facility] hospital lab) Nasopharyngeal Nasopharyngeal Swab     Status: None   Collection Time: 05/15/20  1:05 AM   Specimen: Nasopharyngeal Swab  Result Value Ref Range Status   SARS Coronavirus 2 NEGATIVE NEGATIVE Final    Comment: (NOTE) SARS-CoV-2 target nucleic acids are NOT DETECTED.  The SARS-CoV-2 RNA is generally detectable in upper and lower respiratory specimens during the acute phase of infection. The lowest concentration of SARS-CoV-2 viral copies this assay can detect is 250 copies / mL. A negative result does not preclude SARS-CoV-2 infection and should not be used as the sole basis for treatment or other patient management decisions.  A negative result may occur with improper specimen collection / handling, submission of specimen other than nasopharyngeal swab, presence of viral mutation(s) within the areas targeted by this assay, and inadequate number of viral copies (<250 copies / mL). A negative result must be combined with clinical observations, patient history, and epidemiological information.  Fact Sheet for Patients:   StrictlyIdeas.no  Fact Sheet for Healthcare Providers: BankingDealers.co.za  This test is not yet approved or  cleared by the [place] FDA and has been authorized for detection and/or diagnosis of SARS-CoV-2 by FDA under an Emergency Use Authorization (EUA).  This EUA will remain in effect (meaning this test can be used) for the duration of the COVID-19 declaration under Section 564(b)(1) of the Act, 21 U.S.C. section 360bbb-3(b)(1),  unless the authorization is terminated or revoked sooner.  Performed at [facility], [address] [place], [place] [address]   Aerobic/Anaerobic Culture (surgical/deep wound)     Status: None (Preliminary result)   Collection Time: 05/16/20 10:20 AM   Specimen: PATH Other; Abscess  Result Value Ref Range Status   Specimen Description   Final    ABSCESS RIGHT ELBOW Performed at [facility], [address], [place], [place] [address]    Special Requests   Final    NONE  Performed at [facility], [address] [place], [place] [address]    Gram Stain   Final    RARE WBC PRESENT, PREDOMINANTLY PMN RARE GRAM POSITIVE COCCI IN PAIRS Performed at [facility], [place] [address], [place], [place] [address]    Culture   Final    FEW STAPHYLOCOCCUS AUREUS NO ANAEROBES ISOLATED; CULTURE IN PROGRESS FOR 5 DAYS    Report Status PENDING  Incomplete   Organism ID, Bacteria STAPHYLOCOCCUS AUREUS  Final      Susceptibility   Staphylococcus aureus - MIC*    CIPROFLOXACIN <=0.5 SENSITIVE Sensitive     ERYTHROMYCIN >=8 RESISTANT Resistant     GENTAMICIN <=0.5 SENSITIVE Sensitive     OXACILLIN <=0.25 SENSITIVE Sensitive     TETRACYCLINE >=16 RESISTANT Resistant     VANCOMYCIN 1 SENSITIVE Sensitive     TRIMETH/SULFA <=10 SENSITIVE Sensitive     CLINDAMYCIN <=0.25 SENSITIVE Sensitive     RIFAMPIN <=0.5 SENSITIVE Sensitive     Inducible Clindamycin NEGATIVE Sensitive     * FEW STAPHYLOCOCCUS AUREUS  Aerobic/Anaerobic Culture (surgical/deep wound)     Status: None (Preliminary result)   Collection Time: 05/16/20 11:41 AM   Specimen: PATH Other  Result Value Ref Range Status   Specimen Description   Final    GRAFT RIGHT Performed at [facility] [address], [place], [place] [address]    Special Requests   Final    NONE Performed at [facility], [address] [place], [place] [address]    Gram Stain   Final    RARE WBC  PRESENT, PREDOMINANTLY PMN RARE GRAM POSITIVE COCCI IN PAIRS Performed at [facility], [place] [address], [place], [place] [address]    Culture   Final    MODERATE STAPHYLOCOCCUS AUREUS NO ANAEROBES ISOLATED; CULTURE IN PROGRESS FOR 5 DAYS    Report Status PENDING  Incomplete   Organism ID, Bacteria STAPHYLOCOCCUS AUREUS  Final      Susceptibility   Staphylococcus aureus - MIC*    CIPROFLOXACIN <=0.5 SENSITIVE Sensitive     ERYTHROMYCIN >=8 RESISTANT Resistant     GENTAMICIN <=0.5 SENSITIVE Sensitive     OXACILLIN 0.5 SENSITIVE Sensitive     TETRACYCLINE >=16 RESISTANT Resistant     VANCOMYCIN 1 SENSITIVE Sensitive     TRIMETH/SULFA <=10 SENSITIVE Sensitive     CLINDAMYCIN <=0.25 SENSITIVE Sensitive     RIFAMPIN <=0.5 SENSITIVE Sensitive     Inducible Clindamycin NEGATIVE Sensitive     * MODERATE STAPHYLOCOCCUS AUREUS    Coagulation Studies: No results for input(s): LABPROT, INR in the last 72 hours.  Urinalysis: No results for input(s): COLORURINE, LABSPEC, PHURINE, GLUCOSEU, HGBUR, BILIRUBINUR, KETONESUR, PROTEINUR, UROBILINOGEN, NITRITE, LEUKOCYTESUR in the last 72 hours.  Invalid input(s): APPERANCEUR    Imaging: No results found.   Medications:   . sodium chloride 0 mL/hr at 05/16/20 0123  . [START ON 05/21/2020]  ceFAZolin (ANCEF) IV    . [START ON 05/24/2020]  ceFAZolin (ANCEF) IV    .  ceFAZolin (ANCEF) IV Stopped (05/19/20 1347)   . Chlorhexidine Gluconate Cloth  6 each Topical Daily  . epoetin (EPOGEN/PROCRIT) injection  10,000 Units Intravenous Q M,W,F-HD  . ferric citrate  210 mg Oral TID WC  . heparin  5,000 Units Subcutaneous Q8H  . pantoprazole  40 mg Oral QPM  . Vitamin D (Ergocalciferol)  50,000 Units Oral Q7 days   sodium chloride, acetaminophen **OR** acetaminophen, albuterol, alum &  mag hydroxide-simeth, diphenhydrAMINE, HYDROmorphone (DILAUDID) injection, oxyCODONE-acetaminophen, promethazine, promethazine  Assessment/ Plan:  [name] is a 45 y.o.  male with ESRD on HD, GERD, guillain barre, previous CIVD infection, HTN, CHF, depression who was admitted on 05/14/2020 for Cellulitis [L03.90] Abscess of arm, right [L02.413] Status post excision of his right forearm AVG by Dr. Bridgett Larsson on 05/17/2019  [facility] [place]   1. ESRD with hyperkalemia With complication of dialysis device. AVG resection. Now with RIJ tunneled HD catheter - MWF while inpatient.  Transition to TTS on outpatient.    2. Anemia of CKD  - EPO with HD treatment.   3. Secondary Hyperparathyroidism:  Not currently on binders - Auryxia with meals.   4. Hypertension: currently not on any blood pressure agents.  5. AVG infection: MSSA - will need 4 weeks of antibiotics. Cefazolin 2g on Tuesdays, 2g on Thursdays and 3g on Saturdays. End date 06/09/20.    LOS: 6 Gracianna Vink 1/27/202211:33 AM

## 2020-05-20 NOTE — Progress Notes (Signed)
Discharge instructions reviewed with the patient. Patient sent out via wheelchair to mothers waiting car

## 2020-05-20 NOTE — Progress Notes (Signed)
Patient requesting benadryl for his itching. He uses benadryl at home. Dr Priscella Mann notified and has ordered benadryl

## 2020-05-20 NOTE — Progress Notes (Signed)
Mobility Specialist - Progress Note   05/20/20 1137  Mobility  Activity Refused mobility  Mobility performed by Mobility specialist    Pt refused session at this time. States he wants "to sleep". Will re-attempt at a later date/time.    Patrick Downs Mobility Specialist  05/20/20, 11:38 AM

## 2020-05-20 NOTE — Progress Notes (Signed)
[facility] Daily Progress Note  Subjective: 05/16/20 Excision of right forearmarteriovenous graft  No issues overnight. Patient with continued incisional pain to the right upper extremity.   Objective: Vitals:   05/20/20 0009 05/20/20 0500 05/20/20 0532 05/20/20 0813  BP: 117/86  113/82 116/83  Pulse: 81  75 70  Resp: '18  18 17  '$ Temp: 98.3 F (36.8 C)  98 F (36.7 C) 97.7 F (36.5 C)  TempSrc: Oral  Oral Oral  SpO2: 100%  99% 99%  Weight:  122.8 kg    Height:        Intake/Output Summary (Last 24 hours) at 05/20/2020 1044 Last data filed at 05/20/2020 0400 Gross per 24 hour  Intake 0 ml  Output 2500 ml  Net -2500 ml   Physical Exam: A&Ox3, NAD CV: RRR Pulmonary: CTA Bilaterally Abdomen: Soft, Nontender, Nondistended Vascular: Right upper extremity:Dressing removed. Penrose removed.Serousdrainage. Staples are clean dry and intact. Extremity is soft. Extremities warm distally to fingers. Motor/sensory is intact.   Laboratory: CBC    Component Value Date/Time   WBC 6.8 05/20/2020 0542   HGB 8.9 (L) 05/20/2020 0542   HGB 7.7 (L) 08/22/2014 0653   HCT 28.1 (L) 05/20/2020 0542   HCT 23.9 (L) 08/22/2014 0653   PLT 160 05/20/2020 0542   PLT 195 08/22/2014 0653   BMET    Component Value Date/Time   NA 138 05/20/2020 0542   NA 136 08/22/2014 0653   K 4.8 05/20/2020 0542   K 5.1 08/22/2014 0653   CL 98 05/20/2020 0542   CL 95 (L) 08/22/2014 0653   CO2 25 05/20/2020 0542   CO2 27 08/22/2014 0653   GLUCOSE 91 05/20/2020 0542   GLUCOSE 176 (H) 08/22/2014 0653   BUN 68 (H) 05/20/2020 0542   BUN 71 (H) 08/22/2014 0653   CREATININE 13.61 (H) 05/20/2020 0542   CREATININE 8.87 (H) 08/22/2014 0653   CALCIUM 7.4 (L) 05/20/2020 0542   CALCIUM 7.1 (L) 08/22/2014 0653   GFRNONAA 4 (L) 05/20/2020 0542   GFRNONAA 7 (L) 08/22/2014 0653   GFRAA 4 (L) 01/27/2020 0511   GFRAA 8 (L) 08/22/2014 0653   Assessment/Planning: Patrick Riffle  [name] a 45 y.o.malewho presents with infected right forearm arteriovenous graft constructed with Artegrafts/p excision - POD#4  1) WBCin normal range 2) Hemoglobin stable 3) No further plan for surgery during this inpatient stay. 4) Drain removed. Will plan on bringing the patient back to the operatingroomfor revision in the near future possibly a week or 2 to allow the area to drain and heal. 4) Patient should continue to dialyze through his PermCath 5)Patient to follow-up in 1 week for wound check in our clinic.  Patient expresses understanding. 6) OK for discharge from vascular standpoint  Discussed with Dr. Ellis [facility] PA-C 05/20/2020 10:44 AM

## 2020-05-21 LAB — AEROBIC/ANAEROBIC CULTURE W GRAM STAIN (SURGICAL/DEEP WOUND)

## 2020-05-22 DIAGNOSIS — B9561 Methicillin susceptible Staphylococcus aureus infection as the cause of diseases classified elsewhere: Secondary | ICD-10-CM | POA: Diagnosis not present

## 2020-05-22 DIAGNOSIS — M21371 Foot drop, right foot: Secondary | ICD-10-CM | POA: Diagnosis not present

## 2020-05-22 DIAGNOSIS — I132 Hypertensive heart and chronic kidney disease with heart failure and with stage 5 chronic kidney disease, or end stage renal disease: Secondary | ICD-10-CM | POA: Diagnosis not present

## 2020-05-22 DIAGNOSIS — K219 Gastro-esophageal reflux disease without esophagitis: Secondary | ICD-10-CM | POA: Diagnosis not present

## 2020-05-22 DIAGNOSIS — N186 End stage renal disease: Secondary | ICD-10-CM | POA: Diagnosis not present

## 2020-05-22 DIAGNOSIS — M21372 Foot drop, left foot: Secondary | ICD-10-CM | POA: Diagnosis not present

## 2020-05-22 DIAGNOSIS — T827XXA Infection and inflammatory reaction due to other cardiac and vascular devices, implants and grafts, initial encounter: Secondary | ICD-10-CM | POA: Diagnosis not present

## 2020-05-22 DIAGNOSIS — L03113 Cellulitis of right upper limb: Secondary | ICD-10-CM | POA: Diagnosis not present

## 2020-05-22 DIAGNOSIS — I5030 Unspecified diastolic (congestive) heart failure: Secondary | ICD-10-CM | POA: Diagnosis not present

## 2020-05-24 DIAGNOSIS — N186 End stage renal disease: Secondary | ICD-10-CM | POA: Diagnosis not present

## 2020-05-24 DIAGNOSIS — N051 Unspecified nephritic syndrome with focal and segmental glomerular lesions: Secondary | ICD-10-CM | POA: Diagnosis not present

## 2020-05-24 DIAGNOSIS — Z992 Dependence on renal dialysis: Secondary | ICD-10-CM | POA: Diagnosis not present

## 2020-05-25 ENCOUNTER — Telehealth (INDEPENDENT_AMBULATORY_CARE_PROVIDER_SITE_OTHER): Payer: Self-pay

## 2020-05-25 DIAGNOSIS — Z992 Dependence on renal dialysis: Secondary | ICD-10-CM | POA: Diagnosis not present

## 2020-05-25 DIAGNOSIS — N186 End stage renal disease: Secondary | ICD-10-CM | POA: Diagnosis not present

## 2020-05-25 DIAGNOSIS — N2581 Secondary hyperparathyroidism of renal origin: Secondary | ICD-10-CM | POA: Diagnosis not present

## 2020-05-25 NOTE — Telephone Encounter (Signed)
Tonya with [facility] called requesting verbal orders for nursing visits to be once a week for 9 weeks and 1 time as needed. I spoke with Eulogio Ditch NP and she fine with requesting orders. Tonya with [facility] has been made aware the requesting orders are fine to start.

## 2020-05-27 ENCOUNTER — Ambulatory Visit (INDEPENDENT_AMBULATORY_CARE_PROVIDER_SITE_OTHER): Payer: Medicare HMO | Admitting: Nurse Practitioner

## 2020-05-27 DIAGNOSIS — N2581 Secondary hyperparathyroidism of renal origin: Secondary | ICD-10-CM | POA: Diagnosis not present

## 2020-05-27 DIAGNOSIS — Z992 Dependence on renal dialysis: Secondary | ICD-10-CM | POA: Diagnosis not present

## 2020-05-27 DIAGNOSIS — N186 End stage renal disease: Secondary | ICD-10-CM | POA: Diagnosis not present

## 2020-05-28 DIAGNOSIS — M21371 Foot drop, right foot: Secondary | ICD-10-CM | POA: Diagnosis not present

## 2020-05-28 DIAGNOSIS — I5030 Unspecified diastolic (congestive) heart failure: Secondary | ICD-10-CM | POA: Diagnosis not present

## 2020-05-28 DIAGNOSIS — L03113 Cellulitis of right upper limb: Secondary | ICD-10-CM | POA: Diagnosis not present

## 2020-05-28 DIAGNOSIS — B9561 Methicillin susceptible Staphylococcus aureus infection as the cause of diseases classified elsewhere: Secondary | ICD-10-CM | POA: Diagnosis not present

## 2020-05-28 DIAGNOSIS — K219 Gastro-esophageal reflux disease without esophagitis: Secondary | ICD-10-CM | POA: Diagnosis not present

## 2020-05-28 DIAGNOSIS — N186 End stage renal disease: Secondary | ICD-10-CM | POA: Diagnosis not present

## 2020-05-28 DIAGNOSIS — I132 Hypertensive heart and chronic kidney disease with heart failure and with stage 5 chronic kidney disease, or end stage renal disease: Secondary | ICD-10-CM | POA: Diagnosis not present

## 2020-05-28 DIAGNOSIS — M21372 Foot drop, left foot: Secondary | ICD-10-CM | POA: Diagnosis not present

## 2020-05-28 DIAGNOSIS — T827XXA Infection and inflammatory reaction due to other cardiac and vascular devices, implants and grafts, initial encounter: Secondary | ICD-10-CM | POA: Diagnosis not present

## 2020-05-29 DIAGNOSIS — N2581 Secondary hyperparathyroidism of renal origin: Secondary | ICD-10-CM | POA: Diagnosis not present

## 2020-05-29 DIAGNOSIS — N186 End stage renal disease: Secondary | ICD-10-CM | POA: Diagnosis not present

## 2020-05-29 DIAGNOSIS — Z992 Dependence on renal dialysis: Secondary | ICD-10-CM | POA: Diagnosis not present

## 2020-06-01 DIAGNOSIS — Z992 Dependence on renal dialysis: Secondary | ICD-10-CM | POA: Diagnosis not present

## 2020-06-01 DIAGNOSIS — N186 End stage renal disease: Secondary | ICD-10-CM | POA: Diagnosis not present

## 2020-06-01 DIAGNOSIS — N2581 Secondary hyperparathyroidism of renal origin: Secondary | ICD-10-CM | POA: Diagnosis not present

## 2020-06-02 DIAGNOSIS — B9561 Methicillin susceptible Staphylococcus aureus infection as the cause of diseases classified elsewhere: Secondary | ICD-10-CM | POA: Diagnosis not present

## 2020-06-02 DIAGNOSIS — N186 End stage renal disease: Secondary | ICD-10-CM | POA: Diagnosis not present

## 2020-06-02 DIAGNOSIS — M21371 Foot drop, right foot: Secondary | ICD-10-CM | POA: Diagnosis not present

## 2020-06-02 DIAGNOSIS — I5030 Unspecified diastolic (congestive) heart failure: Secondary | ICD-10-CM | POA: Diagnosis not present

## 2020-06-02 DIAGNOSIS — K219 Gastro-esophageal reflux disease without esophagitis: Secondary | ICD-10-CM | POA: Diagnosis not present

## 2020-06-02 DIAGNOSIS — I132 Hypertensive heart and chronic kidney disease with heart failure and with stage 5 chronic kidney disease, or end stage renal disease: Secondary | ICD-10-CM | POA: Diagnosis not present

## 2020-06-02 DIAGNOSIS — M21372 Foot drop, left foot: Secondary | ICD-10-CM | POA: Diagnosis not present

## 2020-06-02 DIAGNOSIS — L03113 Cellulitis of right upper limb: Secondary | ICD-10-CM | POA: Diagnosis not present

## 2020-06-02 DIAGNOSIS — T827XXA Infection and inflammatory reaction due to other cardiac and vascular devices, implants and grafts, initial encounter: Secondary | ICD-10-CM | POA: Diagnosis not present

## 2020-06-03 DIAGNOSIS — N2581 Secondary hyperparathyroidism of renal origin: Secondary | ICD-10-CM | POA: Diagnosis not present

## 2020-06-03 DIAGNOSIS — N186 End stage renal disease: Secondary | ICD-10-CM | POA: Diagnosis not present

## 2020-06-03 DIAGNOSIS — Z992 Dependence on renal dialysis: Secondary | ICD-10-CM | POA: Diagnosis not present

## 2020-06-04 ENCOUNTER — Ambulatory Visit (INDEPENDENT_AMBULATORY_CARE_PROVIDER_SITE_OTHER): Payer: Medicare HMO | Admitting: Nurse Practitioner

## 2020-06-05 DIAGNOSIS — N186 End stage renal disease: Secondary | ICD-10-CM | POA: Diagnosis not present

## 2020-06-05 DIAGNOSIS — N2581 Secondary hyperparathyroidism of renal origin: Secondary | ICD-10-CM | POA: Diagnosis not present

## 2020-06-05 DIAGNOSIS — Z992 Dependence on renal dialysis: Secondary | ICD-10-CM | POA: Diagnosis not present

## 2020-06-08 DIAGNOSIS — N2581 Secondary hyperparathyroidism of renal origin: Secondary | ICD-10-CM | POA: Diagnosis not present

## 2020-06-08 DIAGNOSIS — Z992 Dependence on renal dialysis: Secondary | ICD-10-CM | POA: Diagnosis not present

## 2020-06-08 DIAGNOSIS — N186 End stage renal disease: Secondary | ICD-10-CM | POA: Diagnosis not present

## 2020-06-09 DIAGNOSIS — I5030 Unspecified diastolic (congestive) heart failure: Secondary | ICD-10-CM | POA: Diagnosis not present

## 2020-06-09 DIAGNOSIS — L03113 Cellulitis of right upper limb: Secondary | ICD-10-CM | POA: Diagnosis not present

## 2020-06-09 DIAGNOSIS — T827XXA Infection and inflammatory reaction due to other cardiac and vascular devices, implants and grafts, initial encounter: Secondary | ICD-10-CM | POA: Diagnosis not present

## 2020-06-09 DIAGNOSIS — M21372 Foot drop, left foot: Secondary | ICD-10-CM | POA: Diagnosis not present

## 2020-06-09 DIAGNOSIS — B9561 Methicillin susceptible Staphylococcus aureus infection as the cause of diseases classified elsewhere: Secondary | ICD-10-CM | POA: Diagnosis not present

## 2020-06-09 DIAGNOSIS — N186 End stage renal disease: Secondary | ICD-10-CM | POA: Diagnosis not present

## 2020-06-09 DIAGNOSIS — K219 Gastro-esophageal reflux disease without esophagitis: Secondary | ICD-10-CM | POA: Diagnosis not present

## 2020-06-09 DIAGNOSIS — M21371 Foot drop, right foot: Secondary | ICD-10-CM | POA: Diagnosis not present

## 2020-06-09 DIAGNOSIS — I132 Hypertensive heart and chronic kidney disease with heart failure and with stage 5 chronic kidney disease, or end stage renal disease: Secondary | ICD-10-CM | POA: Diagnosis not present

## 2020-06-10 DIAGNOSIS — Z992 Dependence on renal dialysis: Secondary | ICD-10-CM | POA: Diagnosis not present

## 2020-06-10 DIAGNOSIS — N2581 Secondary hyperparathyroidism of renal origin: Secondary | ICD-10-CM | POA: Diagnosis not present

## 2020-06-10 DIAGNOSIS — N186 End stage renal disease: Secondary | ICD-10-CM | POA: Diagnosis not present

## 2020-06-12 DIAGNOSIS — N186 End stage renal disease: Secondary | ICD-10-CM | POA: Diagnosis not present

## 2020-06-12 DIAGNOSIS — Z992 Dependence on renal dialysis: Secondary | ICD-10-CM | POA: Diagnosis not present

## 2020-06-12 DIAGNOSIS — N2581 Secondary hyperparathyroidism of renal origin: Secondary | ICD-10-CM | POA: Diagnosis not present

## 2020-06-15 DIAGNOSIS — N186 End stage renal disease: Secondary | ICD-10-CM | POA: Diagnosis not present

## 2020-06-15 DIAGNOSIS — Z992 Dependence on renal dialysis: Secondary | ICD-10-CM | POA: Diagnosis not present

## 2020-06-15 DIAGNOSIS — N2581 Secondary hyperparathyroidism of renal origin: Secondary | ICD-10-CM | POA: Diagnosis not present

## 2020-06-16 ENCOUNTER — Telehealth (INDEPENDENT_AMBULATORY_CARE_PROVIDER_SITE_OTHER): Payer: Self-pay

## 2020-06-16 DIAGNOSIS — N186 End stage renal disease: Secondary | ICD-10-CM | POA: Diagnosis not present

## 2020-06-16 DIAGNOSIS — L03113 Cellulitis of right upper limb: Secondary | ICD-10-CM | POA: Diagnosis not present

## 2020-06-16 DIAGNOSIS — M21372 Foot drop, left foot: Secondary | ICD-10-CM | POA: Diagnosis not present

## 2020-06-16 DIAGNOSIS — T827XXA Infection and inflammatory reaction due to other cardiac and vascular devices, implants and grafts, initial encounter: Secondary | ICD-10-CM | POA: Diagnosis not present

## 2020-06-16 DIAGNOSIS — K219 Gastro-esophageal reflux disease without esophagitis: Secondary | ICD-10-CM | POA: Diagnosis not present

## 2020-06-16 DIAGNOSIS — I132 Hypertensive heart and chronic kidney disease with heart failure and with stage 5 chronic kidney disease, or end stage renal disease: Secondary | ICD-10-CM | POA: Diagnosis not present

## 2020-06-16 DIAGNOSIS — I5030 Unspecified diastolic (congestive) heart failure: Secondary | ICD-10-CM | POA: Diagnosis not present

## 2020-06-16 DIAGNOSIS — M21371 Foot drop, right foot: Secondary | ICD-10-CM | POA: Diagnosis not present

## 2020-06-16 DIAGNOSIS — B9561 Methicillin susceptible Staphylococcus aureus infection as the cause of diseases classified elsewhere: Secondary | ICD-10-CM | POA: Diagnosis not present

## 2020-06-16 NOTE — Telephone Encounter (Signed)
Merry Proud from [facility] called to make the provider aware that the patient dialysis has removed his staples from the right arm.Merry Proud stated that the incision is healed and looks great.

## 2020-06-17 DIAGNOSIS — N186 End stage renal disease: Secondary | ICD-10-CM | POA: Diagnosis not present

## 2020-06-17 DIAGNOSIS — N2581 Secondary hyperparathyroidism of renal origin: Secondary | ICD-10-CM | POA: Diagnosis not present

## 2020-06-17 DIAGNOSIS — Z992 Dependence on renal dialysis: Secondary | ICD-10-CM | POA: Diagnosis not present

## 2020-06-19 DIAGNOSIS — N186 End stage renal disease: Secondary | ICD-10-CM | POA: Diagnosis not present

## 2020-06-19 DIAGNOSIS — Z992 Dependence on renal dialysis: Secondary | ICD-10-CM | POA: Diagnosis not present

## 2020-06-19 DIAGNOSIS — N2581 Secondary hyperparathyroidism of renal origin: Secondary | ICD-10-CM | POA: Diagnosis not present

## 2020-06-21 DIAGNOSIS — T827XXA Infection and inflammatory reaction due to other cardiac and vascular devices, implants and grafts, initial encounter: Secondary | ICD-10-CM | POA: Diagnosis not present

## 2020-06-21 DIAGNOSIS — L03113 Cellulitis of right upper limb: Secondary | ICD-10-CM | POA: Diagnosis not present

## 2020-06-21 DIAGNOSIS — N186 End stage renal disease: Secondary | ICD-10-CM | POA: Diagnosis not present

## 2020-06-21 DIAGNOSIS — B9561 Methicillin susceptible Staphylococcus aureus infection as the cause of diseases classified elsewhere: Secondary | ICD-10-CM | POA: Diagnosis not present

## 2020-06-21 DIAGNOSIS — K219 Gastro-esophageal reflux disease without esophagitis: Secondary | ICD-10-CM | POA: Diagnosis not present

## 2020-06-21 DIAGNOSIS — I132 Hypertensive heart and chronic kidney disease with heart failure and with stage 5 chronic kidney disease, or end stage renal disease: Secondary | ICD-10-CM | POA: Diagnosis not present

## 2020-06-21 DIAGNOSIS — M21371 Foot drop, right foot: Secondary | ICD-10-CM | POA: Diagnosis not present

## 2020-06-21 DIAGNOSIS — M21372 Foot drop, left foot: Secondary | ICD-10-CM | POA: Diagnosis not present

## 2020-06-21 DIAGNOSIS — Z992 Dependence on renal dialysis: Secondary | ICD-10-CM | POA: Diagnosis not present

## 2020-06-21 DIAGNOSIS — N051 Unspecified nephritic syndrome with focal and segmental glomerular lesions: Secondary | ICD-10-CM | POA: Diagnosis not present

## 2020-06-21 DIAGNOSIS — I5030 Unspecified diastolic (congestive) heart failure: Secondary | ICD-10-CM | POA: Diagnosis not present

## 2020-06-23 DIAGNOSIS — I5030 Unspecified diastolic (congestive) heart failure: Secondary | ICD-10-CM | POA: Diagnosis not present

## 2020-06-23 DIAGNOSIS — M21372 Foot drop, left foot: Secondary | ICD-10-CM | POA: Diagnosis not present

## 2020-06-23 DIAGNOSIS — K219 Gastro-esophageal reflux disease without esophagitis: Secondary | ICD-10-CM | POA: Diagnosis not present

## 2020-06-23 DIAGNOSIS — L03113 Cellulitis of right upper limb: Secondary | ICD-10-CM | POA: Diagnosis not present

## 2020-06-23 DIAGNOSIS — I132 Hypertensive heart and chronic kidney disease with heart failure and with stage 5 chronic kidney disease, or end stage renal disease: Secondary | ICD-10-CM | POA: Diagnosis not present

## 2020-06-23 DIAGNOSIS — N186 End stage renal disease: Secondary | ICD-10-CM | POA: Diagnosis not present

## 2020-06-23 DIAGNOSIS — B9561 Methicillin susceptible Staphylococcus aureus infection as the cause of diseases classified elsewhere: Secondary | ICD-10-CM | POA: Diagnosis not present

## 2020-06-23 DIAGNOSIS — T827XXA Infection and inflammatory reaction due to other cardiac and vascular devices, implants and grafts, initial encounter: Secondary | ICD-10-CM | POA: Diagnosis not present

## 2020-06-23 DIAGNOSIS — M21371 Foot drop, right foot: Secondary | ICD-10-CM | POA: Diagnosis not present

## 2020-06-30 DIAGNOSIS — I5030 Unspecified diastolic (congestive) heart failure: Secondary | ICD-10-CM | POA: Diagnosis not present

## 2020-06-30 DIAGNOSIS — T827XXA Infection and inflammatory reaction due to other cardiac and vascular devices, implants and grafts, initial encounter: Secondary | ICD-10-CM | POA: Diagnosis not present

## 2020-06-30 DIAGNOSIS — N186 End stage renal disease: Secondary | ICD-10-CM | POA: Diagnosis not present

## 2020-06-30 DIAGNOSIS — M21371 Foot drop, right foot: Secondary | ICD-10-CM | POA: Diagnosis not present

## 2020-06-30 DIAGNOSIS — M21372 Foot drop, left foot: Secondary | ICD-10-CM | POA: Diagnosis not present

## 2020-06-30 DIAGNOSIS — K219 Gastro-esophageal reflux disease without esophagitis: Secondary | ICD-10-CM | POA: Diagnosis not present

## 2020-06-30 DIAGNOSIS — L03113 Cellulitis of right upper limb: Secondary | ICD-10-CM | POA: Diagnosis not present

## 2020-06-30 DIAGNOSIS — I132 Hypertensive heart and chronic kidney disease with heart failure and with stage 5 chronic kidney disease, or end stage renal disease: Secondary | ICD-10-CM | POA: Diagnosis not present

## 2020-06-30 DIAGNOSIS — B9561 Methicillin susceptible Staphylococcus aureus infection as the cause of diseases classified elsewhere: Secondary | ICD-10-CM | POA: Diagnosis not present

## 2020-07-07 ENCOUNTER — Telehealth: Payer: Self-pay | Admitting: *Deleted

## 2020-07-07 ENCOUNTER — Ambulatory Visit: Payer: Medicare Other | Attending: Anesthesiology | Admitting: Anesthesiology

## 2020-07-07 ENCOUNTER — Other Ambulatory Visit: Payer: Self-pay

## 2020-07-07 ENCOUNTER — Encounter: Payer: Self-pay | Admitting: Anesthesiology

## 2020-07-07 DIAGNOSIS — N186 End stage renal disease: Secondary | ICD-10-CM

## 2020-07-07 DIAGNOSIS — M545 Low back pain, unspecified: Secondary | ICD-10-CM

## 2020-07-07 DIAGNOSIS — M5136 Other intervertebral disc degeneration, lumbar region: Secondary | ICD-10-CM | POA: Diagnosis not present

## 2020-07-07 DIAGNOSIS — Z992 Dependence on renal dialysis: Secondary | ICD-10-CM

## 2020-07-07 DIAGNOSIS — F1190 Opioid use, unspecified, uncomplicated: Secondary | ICD-10-CM

## 2020-07-07 DIAGNOSIS — R29898 Other symptoms and signs involving the musculoskeletal system: Secondary | ICD-10-CM

## 2020-07-07 DIAGNOSIS — G610 Guillain-Barre syndrome: Secondary | ICD-10-CM

## 2020-07-07 DIAGNOSIS — M47816 Spondylosis without myelopathy or radiculopathy, lumbar region: Secondary | ICD-10-CM

## 2020-07-07 DIAGNOSIS — G8929 Other chronic pain: Secondary | ICD-10-CM

## 2020-07-07 DIAGNOSIS — G894 Chronic pain syndrome: Secondary | ICD-10-CM

## 2020-07-07 MED ORDER — OXYCODONE-ACETAMINOPHEN 10-325 MG PO TABS: 1.0000 | ORAL_TABLET | Freq: Four times a day (QID) | ORAL | 0 refills | Status: DC | PRN

## 2020-07-07 MED ORDER — OXYCODONE-ACETAMINOPHEN 10-325 MG PO TABS: 1.0000 | ORAL_TABLET | Freq: Four times a day (QID) | ORAL | 0 refills | Status: AC | PRN

## 2020-07-07 NOTE — Progress Notes (Signed)
Virtual Visit via Telephone Note  I connected with Patrick Downs on 07/07/20 at  2:10 PM EDT by telephone and verified that I am speaking with the correct person using two identifiers.  Location: Patient: Home Provider: Pain control center   I discussed the limitations, risks, security and privacy concerns of performing an evaluation and management service by telephone and the availability of in person appointments. I also discussed with the patient that there may be a patient responsible charge related to this service. The patient expressed understanding and agreed to proceed.   History of Present Illness: I spoke with Patrick Downs today via telephone as we were unable to link for the video portion of virtual conference he reports that he is doing well with his current pain management regimen.  He is taking his medications 4 times a day at the oxycodone 10 mg strength and this continues to work well for his low back pain and diffuse pain.  The quality characteristic and distribution of pain is stable he reports.  No changes are noted.  He is otherwise doing well.  He continues to get 75% relief with each oxycodone tablet lasting about 4 to 6 hours before he has recurrence of his baseline pain.  He reports that more conservative therapy was ineffective.  He states that the oxycodone enables him to stay functional and active and continue to work.  He has been working helping take care of his mother's estate and this is caused some additional low back pain but he attributes this to the increased work load and lifting.  Otherwise no changes are reported.  He denies any diverting or illicit use with the medicines and states that they work well for him.  Observations/Objective:  Current Outpatient Medications:  .  [START ON 08/09/2020] oxyCODONE-acetaminophen (PERCOCET) 10-325 MG tablet, Take 1 tablet by mouth every 6 (six) hours as needed for pain., Disp: 120 tablet, Rfl: 0 .  acetaminophen (TYLENOL) 500 MG  tablet, Take 1,000-1,500 mg by mouth 2 (two) times daily as needed for moderate pain or headache., Disp: , Rfl:  .  AURYXIA 1 GM 210 MG(Fe) tablet, Take 420 mg by mouth 3 (three) times daily., Disp: , Rfl:  .  gabapentin (NEURONTIN) 300 MG capsule, Take 600 mg by mouth at bedtime. , Disp: , Rfl:  .  midodrine (PROAMATINE) 5 MG tablet, Take 5-10 mg by mouth See admin instructions. Take 10 mg by mouth before dialysis on Tuesdays, Thursdays, & Saturday and take 5 mg in the evening on  Tuesdays, Thursdays, & Saturdays. On Monday, Wednesday, Friday and Sunday 5 mg by mouth every 8 hours., Disp: , Rfl:  .  [START ON 07/10/2020] oxyCODONE-acetaminophen (PERCOCET) 10-325 MG tablet, Take 1 tablet by mouth every 6 (six) hours as needed for pain., Disp: 120 tablet, Rfl: 0  Assessment and Plan: 1. DDD (degenerative disc disease), lumbar   2. Chronic bilateral low back pain without sciatica   3. Facet arthritis of lumbar region   4. ESRD on dialysis ([place])   5. Guillain-Barre syndrome ([place])   6. Chronic pain syndrome   7. Chronic, continuous use of opioids   8. Weakness of both lower extremities   Based on our discussion today upon review of the [place] practitioner database information going to refill his medications from March 19 and April 18.  No other changes will be initiated in his pain management protocol.  He was scheduled for a a serum screen back in December but this was  never completed based on our records and I requested that he present for repeat screen sometime in the near future.  I want him to continue follow-up with his primary care physicians for his baseline medical care and his nephrologist as well.  Return to clinic in 2 months. Follow Up Instructions:    I discussed the assessment and treatment plan with the patient. The patient was provided an opportunity to ask questions and all were answered. The patient agreed with the plan and demonstrated an understanding of the instructions.    The patient was advised to call back or seek an in-person evaluation if the symptoms worsen or if the condition fails to improve as anticipated.  I provided 30 minutes of non-face-to-face time during this encounter.   Molli Barrows, MD

## 2020-07-07 NOTE — Telephone Encounter (Signed)
Called patient to ask him to come in for serum drug screen. Will come tomorrow, 07-08-20.

## 2020-07-09 DIAGNOSIS — B9561 Methicillin susceptible Staphylococcus aureus infection as the cause of diseases classified elsewhere: Secondary | ICD-10-CM | POA: Diagnosis not present

## 2020-07-09 DIAGNOSIS — N186 End stage renal disease: Secondary | ICD-10-CM | POA: Diagnosis not present

## 2020-07-09 DIAGNOSIS — T827XXA Infection and inflammatory reaction due to other cardiac and vascular devices, implants and grafts, initial encounter: Secondary | ICD-10-CM | POA: Diagnosis not present

## 2020-07-09 DIAGNOSIS — M21371 Foot drop, right foot: Secondary | ICD-10-CM | POA: Diagnosis not present

## 2020-07-09 DIAGNOSIS — M21372 Foot drop, left foot: Secondary | ICD-10-CM | POA: Diagnosis not present

## 2020-07-09 DIAGNOSIS — K219 Gastro-esophageal reflux disease without esophagitis: Secondary | ICD-10-CM | POA: Diagnosis not present

## 2020-07-09 DIAGNOSIS — L03113 Cellulitis of right upper limb: Secondary | ICD-10-CM | POA: Diagnosis not present

## 2020-07-09 DIAGNOSIS — I5030 Unspecified diastolic (congestive) heart failure: Secondary | ICD-10-CM | POA: Diagnosis not present

## 2020-07-09 DIAGNOSIS — I132 Hypertensive heart and chronic kidney disease with heart failure and with stage 5 chronic kidney disease, or end stage renal disease: Secondary | ICD-10-CM | POA: Diagnosis not present

## 2020-07-12 ENCOUNTER — Telehealth: Payer: Self-pay

## 2020-07-12 ENCOUNTER — Telehealth: Payer: Self-pay | Admitting: Anesthesiology

## 2020-07-12 NOTE — Telephone Encounter (Signed)
error 

## 2020-07-12 NOTE — Telephone Encounter (Signed)
Patient was only able to pick up 7 days of his script. He will need PA and new script for 23 days sent to pharmacy.

## 2020-07-12 NOTE — Telephone Encounter (Signed)
Patient will be calling back to give [place] information RX[employer], pcn, group number

## 2020-07-12 NOTE — Telephone Encounter (Signed)
Will send PA now.

## 2020-07-16 DIAGNOSIS — T827XXA Infection and inflammatory reaction due to other cardiac and vascular devices, implants and grafts, initial encounter: Secondary | ICD-10-CM | POA: Diagnosis not present

## 2020-07-16 DIAGNOSIS — I5030 Unspecified diastolic (congestive) heart failure: Secondary | ICD-10-CM | POA: Diagnosis not present

## 2020-07-16 DIAGNOSIS — M21371 Foot drop, right foot: Secondary | ICD-10-CM | POA: Diagnosis not present

## 2020-07-16 DIAGNOSIS — B9561 Methicillin susceptible Staphylococcus aureus infection as the cause of diseases classified elsewhere: Secondary | ICD-10-CM | POA: Diagnosis not present

## 2020-07-16 DIAGNOSIS — I132 Hypertensive heart and chronic kidney disease with heart failure and with stage 5 chronic kidney disease, or end stage renal disease: Secondary | ICD-10-CM | POA: Diagnosis not present

## 2020-07-16 DIAGNOSIS — M21372 Foot drop, left foot: Secondary | ICD-10-CM | POA: Diagnosis not present

## 2020-07-16 DIAGNOSIS — L03113 Cellulitis of right upper limb: Secondary | ICD-10-CM | POA: Diagnosis not present

## 2020-07-16 DIAGNOSIS — K219 Gastro-esophageal reflux disease without esophagitis: Secondary | ICD-10-CM | POA: Diagnosis not present

## 2020-07-16 DIAGNOSIS — N186 End stage renal disease: Secondary | ICD-10-CM | POA: Diagnosis not present

## 2020-07-19 ENCOUNTER — Telehealth: Payer: Self-pay | Admitting: *Deleted

## 2020-07-19 NOTE — Telephone Encounter (Signed)
Patient called and updated about his PA for oxycodone. It is requiring additional documentation for PA to be approved. Lori faxed the PA again with the denial letter to [facility] with the additional documentation. Instructed patient to call tomorrow and check.

## 2020-07-20 ENCOUNTER — Telehealth: Payer: Self-pay

## 2020-07-20 NOTE — Telephone Encounter (Signed)
0

## 2020-07-20 NOTE — Telephone Encounter (Signed)
1415  Patient called and stated that he was only able to get a 7 day supply of Oxycodone. He was waiting for a PA and now needs the remaining amount prescribed.[id number] Called Dr Andree Elk and told him about needs for remaining prescription. He asked about if patient ever came in for UA and there were no results . Dr Andree Elk states he will not write prescription until he gets UA done and resulted.

## 2020-07-20 NOTE — Telephone Encounter (Signed)
Called patient and told him That he needed to have lab work drawn before Dr Andree Elk would write additional prescription for oxycodone. Patient stated that it wouldn't do him any good to come in now. I told him that Dr Andree Elk told him and ordered it on 07/07/20. Last serum drug screen done on 01/06/20. Patient with understanding,

## 2020-08-23 ENCOUNTER — Other Ambulatory Visit: Payer: Self-pay

## 2020-08-23 ENCOUNTER — Encounter: Payer: Self-pay | Admitting: Anesthesiology

## 2020-08-23 ENCOUNTER — Ambulatory Visit: Payer: Medicare Other | Attending: Anesthesiology | Admitting: Anesthesiology

## 2020-08-23 DIAGNOSIS — N186 End stage renal disease: Secondary | ICD-10-CM | POA: Diagnosis not present

## 2020-08-23 DIAGNOSIS — M545 Low back pain, unspecified: Secondary | ICD-10-CM | POA: Diagnosis not present

## 2020-08-23 DIAGNOSIS — G610 Guillain-Barre syndrome: Secondary | ICD-10-CM

## 2020-08-23 DIAGNOSIS — F1190 Opioid use, unspecified, uncomplicated: Secondary | ICD-10-CM

## 2020-08-23 DIAGNOSIS — M5136 Other intervertebral disc degeneration, lumbar region: Secondary | ICD-10-CM

## 2020-08-23 DIAGNOSIS — G8929 Other chronic pain: Secondary | ICD-10-CM

## 2020-08-23 DIAGNOSIS — Z992 Dependence on renal dialysis: Secondary | ICD-10-CM

## 2020-08-23 DIAGNOSIS — M47816 Spondylosis without myelopathy or radiculopathy, lumbar region: Secondary | ICD-10-CM

## 2020-08-23 DIAGNOSIS — G894 Chronic pain syndrome: Secondary | ICD-10-CM

## 2020-08-23 MED ORDER — OXYCODONE-ACETAMINOPHEN 10-325 MG PO TABS: 1.0000 | ORAL_TABLET | ORAL | 0 refills | Status: AC | PRN

## 2020-08-23 MED ORDER — OXYCODONE-ACETAMINOPHEN 10-325 MG PO TABS: 1.0000 | ORAL_TABLET | ORAL | 0 refills | Status: DC | PRN

## 2020-08-23 NOTE — Progress Notes (Signed)
Virtual Visit via Telephone Note  I connected with Patrick Downs on 08/23/20 at  1:00 PM EDT by telephone and verified that I am speaking with the correct person using two identifiers.  Location: Patient: Home Provider: Pain control center   I discussed the limitations, risks, security and privacy concerns of performing an evaluation and management service by telephone and the availability of in person appointments. I also discussed with the patient that there may be a patient responsible charge related to this service. The patient expressed understanding and agreed to proceed.   History of Present Illness:  I spoke with Patrick Downs today via telephone.  We were unable to link for the video portion of the virtual conference and reports that his low back pain has been stable in nature with no significant changes reported recently.  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  He does have more frequent breakthrough pain with his low back and legs during the day.  He takes his Percocet 10/325 4 times a day presently.  He generally gets 4 hours of relief before he gets breakthrough pain.  No side effects with the medication are reported nor is there any sedation issue.  He has recently reported for a serum drug screen as well.  This was for routine purpose.  The quality characteristic and distribution of his low back pain and leg pain are stable in nature without recent change.  His lower extremity strength and function are also are stable.  He is trying to do his exercises for core stretching strengthening as well as lose weight however this has been with limited success.  He denies any diverting or illicit use with his medications. Observations/Objective:  Current Outpatient Medications:  .  [START ON 10/08/2020] oxyCODONE-acetaminophen (PERCOCET) 10-325 MG tablet, Take 1 tablet by mouth every 4 (four) hours as needed for pain., Disp: 135 tablet, Rfl: 0 .  acetaminophen  (TYLENOL) 500 MG tablet, Take 1,000-1,500 mg by mouth 2 (two) times daily as needed for moderate pain or headache., Disp: , Rfl:  .  AURYXIA 1 GM 210 MG(Fe) tablet, Take 420 mg by mouth 3 (three) times daily., Disp: , Rfl:  .  gabapentin (NEURONTIN) 300 MG capsule, Take 600 mg by mouth at bedtime. , Disp: , Rfl:  .  midodrine (PROAMATINE) 5 MG tablet, Take 5-10 mg by mouth See admin instructions. Take 10 mg by mouth before dialysis on Tuesdays, Thursdays, & Saturday and take 5 mg in the evening on  Tuesdays, Thursdays, & Saturdays. On Monday, Wednesday, Friday and Sunday 5 mg by mouth every 8 hours., Disp: , Rfl:  .  [START ON 09/08/2020] oxyCODONE-acetaminophen (PERCOCET) 10-325 MG tablet, Take 1 tablet by mouth every 4 (four) hours as needed for pain., Disp: 135 tablet, Rfl: 0  Assessment and Plan: 1. DDD (degenerative disc disease), lumbar   2. Chronic bilateral low back pain without sciatica   3. Facet arthritis of lumbar region   4. ESRD on dialysis ([place])   5. Guillain-Barre syndrome ([place])   6. Chronic pain syndrome   7. Chronic, continuous use of opioids   Based on our discussion today I feel it is appropriate to refill his medications.  This will be dated from May 18 and June 17.  He has recently reported for a routine serum drug screen and this result is pending.  Based on our discussion today and his current circumstances as well as in consideration of his body habitus  and increased to every 4 hour dosing 15 days of the month during his heavier work cycles would be appropriate.  As such we will going to increase his dosing to 135 tablets to be taken on a every 4 hour dosing primarily on the days where he is working.  We have also had a long discussion about the avoidance of acetaminophen containing products to keep him below the 3000 mg daily dosing.  I want him to continue efforts at stretching strengthening as reviewed and continue follow-up with his primary care physicians for his baseline  medical care with a rescheduled appointment with [place] in 2 months.  Follow Up Instructions:    I discussed the assessment and treatment plan with the patient. The patient was provided an opportunity to ask questions and all were answered. The patient agreed with the plan and demonstrated an understanding of the instructions.   The patient was advised to call back or seek an in-person evaluation if the symptoms worsen or if the condition fails to improve as anticipated.  I provided 30 minutes of non-face-to-face time during this encounter.   Molli Barrows, MD

## 2020-09-06 LAB — DRUG SCREEN 10 W/CONF, SERUM
Amphetamines, IA: NEGATIVE ng/mL
Barbiturates, IA: NEGATIVE ug/mL
Benzodiazepines, IA: NEGATIVE ng/mL
Cocaine & Metabolite, IA: NEGATIVE ng/mL
Methadone, IA: NEGATIVE ng/mL
Opiates, IA: NEGATIVE ng/mL
Oxycodones, IA: POSITIVE ng/mL — AB
Phencyclidine, IA: NEGATIVE ng/mL
Propoxyphene, IA: NEGATIVE ng/mL
THC(Marijuana) Metabolite, IA: NEGATIVE ng/mL

## 2020-09-06 LAB — OXYCODONES,MS,WB/SP RFX
Oxycocone: 22.1 ng/mL
Oxycodones Confirmation: POSITIVE
Oxymorphone: NEGATIVE ng/mL

## 2020-10-26 ENCOUNTER — Ambulatory Visit: Payer: Medicare Other | Attending: Anesthesiology | Admitting: Anesthesiology

## 2020-10-26 ENCOUNTER — Encounter: Payer: Self-pay | Admitting: Anesthesiology

## 2020-10-26 ENCOUNTER — Other Ambulatory Visit: Payer: Self-pay

## 2020-10-26 DIAGNOSIS — I770 Arteriovenous fistula, acquired: Secondary | ICD-10-CM

## 2020-10-26 DIAGNOSIS — M47816 Spondylosis without myelopathy or radiculopathy, lumbar region: Secondary | ICD-10-CM

## 2020-10-26 DIAGNOSIS — G610 Guillain-Barre syndrome: Secondary | ICD-10-CM

## 2020-10-26 DIAGNOSIS — M545 Low back pain, unspecified: Secondary | ICD-10-CM | POA: Diagnosis not present

## 2020-10-26 DIAGNOSIS — Z992 Dependence on renal dialysis: Secondary | ICD-10-CM

## 2020-10-26 DIAGNOSIS — M5136 Other intervertebral disc degeneration, lumbar region: Secondary | ICD-10-CM

## 2020-10-26 DIAGNOSIS — N186 End stage renal disease: Secondary | ICD-10-CM

## 2020-10-26 DIAGNOSIS — G8929 Other chronic pain: Secondary | ICD-10-CM

## 2020-10-26 DIAGNOSIS — G894 Chronic pain syndrome: Secondary | ICD-10-CM

## 2020-10-26 DIAGNOSIS — R29898 Other symptoms and signs involving the musculoskeletal system: Secondary | ICD-10-CM

## 2020-10-26 DIAGNOSIS — F1190 Opioid use, unspecified, uncomplicated: Secondary | ICD-10-CM

## 2020-10-26 MED ORDER — OXYCODONE-ACETAMINOPHEN 10-325 MG PO TABS: 1.0000 | ORAL_TABLET | ORAL | 0 refills | Status: DC | PRN

## 2020-10-26 MED ORDER — HYDROCODONE-ACETAMINOPHEN 10-325 MG PO TABS: 1.0000 | ORAL_TABLET | ORAL | 0 refills | Status: AC | PRN

## 2020-10-26 NOTE — Progress Notes (Signed)
Virtual Visit via Telephone Note  I connected with [name] on 10/26/20 at  2:30 PM EDT by telephone and verified that I am speaking with the correct person using two identifiers.  Location: Patient: Home Provider: Pain control center   I discussed the limitations, risks, security and privacy concerns of performing an evaluation and management service by telephone and the availability of in person appointments. I also discussed with the patient that there may be a patient responsible charge related to this service. The patient expressed understanding and agreed to proceed.   History of Present Illness: I spoke with Patrick Downs today via telephone as he was unable to link for the video portion of the virtual conference.  He reports that he has been doing well in regards to his low back pain and the recent change in medication frequency has helped his pain management.  He is having no side effects with the medication and getting better relief throughout the day.  He takes his oxycodone approximately every 4-5 hours.  No side effects reported.  He gets about 75 to 80% relief whereas no other therapy has been effective for him.  Otherwise he is in his usual state of health and no new change in the style characteristic quality or distribution of his low back pain.  His strength is been at baseline.  No other changes are reported.  His bowel and bladder function have been stable though he is dialysis dependent and receiving dialysis at the time of our conversation.  Review of systems: General: No nausea or vomiting or fevers or chills Cardiac: No angina or palpitation Pulmonary: No dyspnea or shortness of breath GI: No constipation or abdominal pain Psych: No depression   Observations/Objective:  Current Outpatient Medications:    [START ON 12/07/2020] HYDROcodone-acetaminophen (NORCO) 10-325 MG tablet, Take 1 tablet by mouth every 4 (four) hours as needed for moderate pain or severe pain., Disp:  135 tablet, Rfl: 0   acetaminophen (TYLENOL) 500 MG tablet, Take 1,000-1,500 mg by mouth 2 (two) times daily as needed for moderate pain or headache., Disp: , Rfl:    AURYXIA 1 GM 210 MG(Fe) tablet, Take 420 mg by mouth 3 (three) times daily., Disp: , Rfl:    gabapentin (NEURONTIN) 300 MG capsule, Take 600 mg by mouth at bedtime. , Disp: , Rfl:    midodrine (PROAMATINE) 5 MG tablet, Take 5-10 mg by mouth See admin instructions. Take 10 mg by mouth before dialysis on Tuesdays, Thursdays, & Saturday and take 5 mg in the evening on  Tuesdays, Thursdays, & Saturdays. On Monday, Wednesday, Friday and Sunday 5 mg by mouth every 8 hours., Disp: , Rfl:    [START ON 11/06/2020] oxyCODONE-acetaminophen (PERCOCET) 10-325 MG tablet, Take 1 tablet by mouth every 4 (four) hours as needed for pain., Disp: 135 tablet, Rfl: 0   Assessment and Plan: 1. DDD (degenerative disc disease), lumbar   2. Chronic bilateral low back pain without sciatica   3. Facet arthritis of lumbar region   4. ESRD on dialysis (Patrick Downs)   5. Guillain-Barre syndrome (Patrick Downs)   6. Chronic pain syndrome   7. Chronic, continuous use of opioids   8. Weakness of both lower extremities   9. A-V fistula (Patrick Downs)   Based on her discussion I think it is appropriate to refill his medications for the next 2 months and I have reviewed the [facility] practitioner database information and it is appropriate.  He shows no evidence of any diverting or  illicit use and has failed more conservative therapy and her current regimen is working well for him.  He is also had a recent serum drug screen which was appropriate.  Refills will be scheduled for July 17 and August 16 with return to clinic in 2 months.  I encouraged him to continue follow-up with his primary care physicians for his baseline medical care.  Follow Up Instructions:    I discussed the assessment and treatment plan with the patient. The patient was provided an opportunity to ask questions and all  were answered. The patient agreed with the plan and demonstrated an understanding of the instructions.   The patient was advised to call back or seek an in-person evaluation if the symptoms worsen or if the condition fails to improve as anticipated.  I provided 30 minutes of non-face-to-face time during this encounter.   Molli Barrows, MD

## 2020-12-06 ENCOUNTER — Telehealth: Payer: Self-pay

## 2020-12-06 MED ORDER — OXYCODONE-ACETAMINOPHEN 10-325 MG PO TABS: 1.0000 | ORAL_TABLET | ORAL | 0 refills | Status: DC | PRN

## 2020-12-06 NOTE — Telephone Encounter (Signed)
Patient called in to say that his August Rx was not sent in.  Per the chart there is a hydrocodone - apap 10-325 mg to be filled tomorrow, 12/07/20.  Patient states that this Rx should have been sent for 12/06/20 and he is currently out of medication.  I told him that I would let Dr Andree Elk know.

## 2020-12-06 NOTE — Telephone Encounter (Signed)
Rx not sent in for Aug

## 2020-12-06 NOTE — Telephone Encounter (Signed)
Notified patient that he could pick up his prescriptions tomorrow.

## 2020-12-06 NOTE — Addendum Note (Signed)
Addended by: Molli Barrows on: 12/06/2020 04:41 PM   Modules accepted: Orders

## 2020-12-24 ENCOUNTER — Other Ambulatory Visit: Payer: Self-pay

## 2020-12-24 ENCOUNTER — Inpatient Hospital Stay: Payer: Medicare Other | Attending: Anesthesiology | Admitting: Anesthesiology

## 2020-12-24 ENCOUNTER — Encounter: Payer: Self-pay | Admitting: Anesthesiology

## 2020-12-24 DIAGNOSIS — G894 Chronic pain syndrome: Secondary | ICD-10-CM

## 2020-12-24 DIAGNOSIS — M545 Low back pain, unspecified: Secondary | ICD-10-CM | POA: Diagnosis not present

## 2020-12-24 DIAGNOSIS — M47816 Spondylosis without myelopathy or radiculopathy, lumbar region: Secondary | ICD-10-CM | POA: Diagnosis not present

## 2020-12-24 DIAGNOSIS — M5136 Other intervertebral disc degeneration, lumbar region: Secondary | ICD-10-CM

## 2020-12-24 DIAGNOSIS — G8929 Other chronic pain: Secondary | ICD-10-CM

## 2020-12-24 DIAGNOSIS — M25552 Pain in left hip: Secondary | ICD-10-CM

## 2020-12-24 DIAGNOSIS — Z992 Dependence on renal dialysis: Secondary | ICD-10-CM

## 2020-12-24 DIAGNOSIS — N186 End stage renal disease: Secondary | ICD-10-CM | POA: Diagnosis not present

## 2020-12-24 DIAGNOSIS — M51369 Other intervertebral disc degeneration, lumbar region without mention of lumbar back pain or lower extremity pain: Secondary | ICD-10-CM

## 2020-12-24 DIAGNOSIS — G610 Guillain-Barre syndrome: Secondary | ICD-10-CM

## 2020-12-24 DIAGNOSIS — F1190 Opioid use, unspecified, uncomplicated: Secondary | ICD-10-CM

## 2020-12-24 HISTORY — DX: Other chronic pain: G89.29

## 2020-12-24 MED ORDER — OXYCODONE-ACETAMINOPHEN 10-325 MG PO TABS: 1.0000 | ORAL_TABLET | ORAL | 0 refills | Status: AC | PRN

## 2020-12-24 MED ORDER — OXYCODONE-ACETAMINOPHEN 10-325 MG PO TABS: 1.0000 | ORAL_TABLET | ORAL | 0 refills | Status: DC | PRN

## 2020-12-24 NOTE — Progress Notes (Signed)
Virtual Visit via Telephone Note  I connected with [name] on 12/24/20 at  1:40 PM EDT by telephone and verified that I am speaking with the correct person using two identifiers.  Location: Patient: Home Provider: Pain control center   I discussed the limitations, risks, security and privacy concerns of performing an evaluation and management service by telephone and the availability of in person appointments. I also discussed with the patient that there may be a patient responsible charge related to this service. The patient expressed understanding and agreed to proceed.   History of Present Illness: I spoke with Patrick Downs via telephone today as we are unable to link for the video portion of the virtual conference he reports that he is doing well in regards to his low back pain.  He did sustain a fall back in May and has had some intermittent left hip pain that has been bothersome.  He is due for an evaluation here soon.  He is able to weight-bear and ambulate without difficulty.  The pain is worse with certain positional changes about the left hip with some associated groin pain as well.  He has not had x-ray but is planning for evaluation this coming week.  The low back pain, otherwise, has been stable.  He takes his Percocet approximately 4-5 times a day every 4-5 hours and this continues to give him good relief.  The medications continue to enable him to derive good functional benefit with no side effects reported.  Otherwise she is in his usual state of health.  Review of systems: General: No fevers or chills Pulmonary: No shortness of breath or dyspnea Cardiac: No angina or palpitations or lightheadedness GI: No abdominal pain or constipation Psych: No depression    Observations/Objective:  Current Outpatient Medications:    [START ON 02/04/2021] oxyCODONE-acetaminophen (PERCOCET) 10-325 MG tablet, Take 1 tablet by mouth every 4 (four) hours as needed for pain., Disp: 135 tablet,  Rfl: 0   acetaminophen (TYLENOL) 500 MG tablet, Take 1,000-1,500 mg by mouth 2 (two) times daily as needed for moderate pain or headache., Disp: , Rfl:    AURYXIA 1 GM 210 MG(Fe) tablet, Take 420 mg by mouth 3 (three) times daily., Disp: , Rfl:    gabapentin (NEURONTIN) 300 MG capsule, Take 600 mg by mouth at bedtime. , Disp: , Rfl:    HYDROcodone-acetaminophen (NORCO) 10-325 MG tablet, Take 1 tablet by mouth every 4 (four) hours as needed for moderate pain or severe pain., Disp: 135 tablet, Rfl: 0   midodrine (PROAMATINE) 5 MG tablet, Take 5-10 mg by mouth See admin instructions. Take 10 mg by mouth before dialysis on Tuesdays, Thursdays, & Saturday and take 5 mg in the evening on  Tuesdays, Thursdays, & Saturdays. On Monday, Wednesday, Friday and Sunday 5 mg by mouth every 8 hours., Disp: , Rfl:    [START ON 01/05/2021] oxyCODONE-acetaminophen (PERCOCET) 10-325 MG tablet, Take 1 tablet by mouth every 4 (four) hours as needed for pain., Disp: 135 tablet, Rfl: 0   Assessment and Plan: 1. DDD (degenerative disc disease), lumbar   2. Chronic bilateral low back pain without sciatica   3. Facet arthritis of lumbar region   4. ESRD on dialysis ([place])   5. Guillain-Barre syndrome ([place])   6. Chronic pain syndrome   7. Chronic, continuous use of opioids   8. Hip pain, chronic, left   Based on our discussion today and after review of the [facility] practitioner database information I think  is appropriate for refills dated for September 14 and October 14.  No change in quantity or pain management protocol will be initiated.  We have spoken in regards to the hip pain and I feel that this does need evaluation and it is currently scheduled.  I want him to continue efforts at weight loss stretching strengthening as reviewed and we will schedule him for 67-monthreturn.  Follow Up Instructions:    I discussed the assessment and treatment plan with the patient. The patient was provided an opportunity to ask  questions and all were answered. The patient agreed with the plan and demonstrated an understanding of the instructions.   The patient was advised to call back or seek an in-person evaluation if the symptoms worsen or if the condition fails to improve as anticipated.  I provided 30 minutes of non-face-to-face time during this encounter.   JMolli Barrows MD

## 2021-02-03 ENCOUNTER — Ambulatory Visit: Payer: Medicare Other | Admitting: Urology

## 2021-02-04 ENCOUNTER — Encounter: Payer: Self-pay | Admitting: Urology

## 2021-03-01 ENCOUNTER — Encounter: Payer: Self-pay | Admitting: Anesthesiology

## 2021-03-01 ENCOUNTER — Other Ambulatory Visit: Payer: Self-pay

## 2021-03-01 ENCOUNTER — Ambulatory Visit: Payer: Medicare Other | Attending: Anesthesiology | Admitting: Anesthesiology

## 2021-03-01 DIAGNOSIS — M51369 Other intervertebral disc degeneration, lumbar region without mention of lumbar back pain or lower extremity pain: Secondary | ICD-10-CM

## 2021-03-01 DIAGNOSIS — G894 Chronic pain syndrome: Secondary | ICD-10-CM

## 2021-03-01 DIAGNOSIS — M545 Low back pain, unspecified: Secondary | ICD-10-CM

## 2021-03-01 DIAGNOSIS — M5136 Other intervertebral disc degeneration, lumbar region: Secondary | ICD-10-CM | POA: Diagnosis not present

## 2021-03-01 DIAGNOSIS — M47816 Spondylosis without myelopathy or radiculopathy, lumbar region: Secondary | ICD-10-CM | POA: Diagnosis not present

## 2021-03-01 DIAGNOSIS — Z992 Dependence on renal dialysis: Secondary | ICD-10-CM

## 2021-03-01 DIAGNOSIS — G8929 Other chronic pain: Secondary | ICD-10-CM

## 2021-03-01 DIAGNOSIS — N186 End stage renal disease: Secondary | ICD-10-CM

## 2021-03-01 DIAGNOSIS — G610 Guillain-Barre syndrome: Secondary | ICD-10-CM

## 2021-03-01 DIAGNOSIS — F1190 Opioid use, unspecified, uncomplicated: Secondary | ICD-10-CM

## 2021-03-01 MED ORDER — OXYCODONE-ACETAMINOPHEN 10-325 MG PO TABS
1.0000 | ORAL_TABLET | ORAL | 0 refills | Status: DC | PRN
Start: 2021-04-05 — End: 2021-05-04

## 2021-03-01 MED ORDER — OXYCODONE-ACETAMINOPHEN 10-325 MG PO TABS: 1.0000 | ORAL_TABLET | ORAL | 0 refills | Status: AC | PRN

## 2021-03-01 NOTE — Progress Notes (Signed)
Virtual Visit via Telephone Note  I connected with Patrick Downs on 03/01/21 at  1:00 PM EST by telephone and verified that I am speaking with the correct person using two identifiers.  Location: Patient: Home Provider: Pain control center   I discussed the limitations, risks, security and privacy concerns of performing an evaluation and management service by telephone and the availability of in person appointments. I also discussed with the patient that there may be a patient responsible charge related to this service. The patient expressed understanding and agreed to proceed.   History of Present Illness: I spoke with Patrick Downs via telephone as we were unable to link at the video portion of the conference.  He states that he is doing well in regards to his low back pain hip pain and lower extremity pain.  This is all been stable in nature with no recent changes in the quality characteristic or distribution.  He is tolerating any breakthrough pain well and taking his Percocet approximately 5 tablets/day without difficulty and this combination is keeping his pain under good control.  He is unable to take anti-inflammatory secondary to his end-stage renal disease and is currently still undergoing dialysis.  Medications not got about 50 to 75% of his low back pain and has been on this regimen for an extended period of time.  No side effects reported and this combination seems to keep his pain under control and allow him to stay functional and active.  Otherwise he is in his usual state of health.  Review of systems: General: No fevers or chills Pulmonary: No shortness of breath or dyspnea Cardiac: No angina or palpitations or lightheadedness GI: No abdominal pain or constipation Psych: No depression    Observations/Objective:  Current Outpatient Medications:    [START ON 04/05/2021] oxyCODONE-acetaminophen (PERCOCET) 10-325 MG tablet, Take 1 tablet by mouth every 4 (four) hours as needed  for pain., Disp: 135 tablet, Rfl: 0   acetaminophen (TYLENOL) 500 MG tablet, Take 1,000-1,500 mg by mouth 2 (two) times daily as needed for moderate pain or headache., Disp: , Rfl:    AURYXIA 1 GM 210 MG(Fe) tablet, Take 420 mg by mouth 3 (three) times daily., Disp: , Rfl:    gabapentin (NEURONTIN) 300 MG capsule, Take 600 mg by mouth at bedtime. , Disp: , Rfl:    midodrine (PROAMATINE) 5 MG tablet, Take 5-10 mg by mouth See admin instructions. Take 10 mg by mouth before dialysis on Tuesdays, Thursdays, & Saturday and take 5 mg in the evening on  Tuesdays, Thursdays, & Saturdays. On Monday, Wednesday, Friday and Sunday 5 mg by mouth every 8 hours., Disp: , Rfl:    [START ON 03/06/2021] oxyCODONE-acetaminophen (PERCOCET) 10-325 MG tablet, Take 1 tablet by mouth every 4 (four) hours as needed for pain., Disp: 135 tablet, Rfl: 0   Assessment and Plan: 1. DDD (degenerative disc disease), lumbar   2. Chronic bilateral low back pain without sciatica   3. Facet arthritis of lumbar region   4. ESRD on dialysis ([place])   5. Guillain-Barre syndrome ([place])   6. Chronic, continuous use of opioids   7. Chronic pain syndrome   Based on our discussion today and after review of the [facility] practitioner database information I think is appropriate to refill his medicines for the next 2 months dated for November 13 in December 13.  No changes in his regimen will be initiated today.  Per our conversation I have encouraged him to continue efforts  at weight loss stretching strengthening.  I want him to continue follow-up with his primary care physicians for baseline medical care.  He is not a candidate for interventional therapy but appears to be doing well with his opioid management.  He will be scheduled for return to clinic in 2 months.  Follow Up Instructions:    I discussed the assessment and treatment plan with the patient. The patient was provided an opportunity to ask questions and all were answered. The  patient agreed with the plan and demonstrated an understanding of the instructions.   The patient was advised to call back or seek an in-person evaluation if the symptoms worsen or if the condition fails to improve as anticipated.  I provided 30 minutes of non-face-to-face time during this encounter.   Molli Barrows, MD

## 2021-05-04 ENCOUNTER — Ambulatory Visit: Payer: Medicare Other | Attending: Anesthesiology | Admitting: Anesthesiology

## 2021-05-04 ENCOUNTER — Other Ambulatory Visit: Payer: Self-pay

## 2021-05-04 DIAGNOSIS — F1190 Opioid use, unspecified, uncomplicated: Secondary | ICD-10-CM | POA: Diagnosis not present

## 2021-05-04 DIAGNOSIS — M545 Low back pain, unspecified: Secondary | ICD-10-CM

## 2021-05-04 DIAGNOSIS — Z992 Dependence on renal dialysis: Secondary | ICD-10-CM

## 2021-05-04 DIAGNOSIS — G894 Chronic pain syndrome: Secondary | ICD-10-CM | POA: Diagnosis not present

## 2021-05-04 DIAGNOSIS — R29898 Other symptoms and signs involving the musculoskeletal system: Secondary | ICD-10-CM

## 2021-05-04 DIAGNOSIS — M5136 Other intervertebral disc degeneration, lumbar region: Secondary | ICD-10-CM | POA: Diagnosis not present

## 2021-05-04 DIAGNOSIS — G610 Guillain-Barre syndrome: Secondary | ICD-10-CM

## 2021-05-04 DIAGNOSIS — M47816 Spondylosis without myelopathy or radiculopathy, lumbar region: Secondary | ICD-10-CM

## 2021-05-04 DIAGNOSIS — N186 End stage renal disease: Secondary | ICD-10-CM

## 2021-05-04 DIAGNOSIS — G8929 Other chronic pain: Secondary | ICD-10-CM

## 2021-05-04 DIAGNOSIS — M25552 Pain in left hip: Secondary | ICD-10-CM

## 2021-05-04 MED ORDER — OXYCODONE-ACETAMINOPHEN 10-325 MG PO TABS: 1.0000 | ORAL_TABLET | ORAL | 0 refills | Status: DC | PRN

## 2021-05-10 NOTE — Progress Notes (Signed)
Virtual Visit via Telephone Note  I connected with Patrick Downs on 05/10/21 at  1:30 PM EST by telephone and verified that I am speaking with the correct person using two identifiers.  Location: Patient: Home Provider: Pain control center   I discussed the limitations, risks, security and privacy concerns of performing an evaluation and management service by telephone and the availability of in person appointments. I also discussed with the patient that there may be a patient responsible charge related to this service. The patient expressed understanding and agreed to proceed.   History of Present Illness: I spoke with Patrick Downs today via telephone as he was unable due to link for the video portion of the conference he states that he is doing reasonably well in regards to his low back pain.  Quality characteristic and distribution of this remained stable in nature.  No real changes are noted.  He still gets some leg pain as well but takes his medications for pain relief.  He has been compliant with the regimen and tends to get 75% improvement with each opioid dosing lasting for about 4 to 6 hours.  More conservative therapy has been ineffective he reports.  He is staying active and denies any change in bowel or bladder function.  He is currently on renal dialysis.  Lower extremity strength or function is at baseline as well.  Review of systems: General: No fevers or chills Pulmonary: No shortness of breath or dyspnea Cardiac: No angina or palpitations or lightheadedness GI: No abdominal pain or constipation Psych: No depression    Observations/Objective:  Current Outpatient Medications:    acetaminophen (TYLENOL) 500 MG tablet, Take 1,000-1,500 mg by mouth 2 (two) times daily as needed for moderate pain or headache., Disp: , Rfl:    AURYXIA 1 GM 210 MG(Fe) tablet, Take 420 mg by mouth 3 (three) times daily., Disp: , Rfl:    gabapentin (NEURONTIN) 300 MG capsule, Take 600 mg by mouth at  bedtime. , Disp: , Rfl:    midodrine (PROAMATINE) 5 MG tablet, Take 5-10 mg by mouth See admin instructions. Take 10 mg by mouth before dialysis on Tuesdays, Thursdays, & Saturday and take 5 mg in the evening on  Tuesdays, Thursdays, & Saturdays. On Monday, Wednesday, Friday and Sunday 5 mg by mouth every 8 hours., Disp: , Rfl:    oxyCODONE-acetaminophen (PERCOCET) 10-325 MG tablet, Take 1 tablet by mouth every 4 (four) hours as needed for pain., Disp: 135 tablet, Rfl: 0  Past Medical History:  Diagnosis Date   CHF (congestive heart failure) (HCC)    Degenerative disc disease, lumbar    Depression    Dialysis patient ([place])    ESRD (end stage renal disease) on dialysis ([place])    Failure to thrive in adult    GERD (gastroesophageal reflux disease)    Gout    Guillain Barr syndrome ([place])    Guillain Barr syndrome ([place])    Heart murmur    Hip pain, chronic, left 12/24/2020   HTN (hypertension)    Hyperparathyroidism (HCC)    Kidney failure    MRSA (methicillin resistant staph aureus) culture positive    Peripheral neuropathy    Pneumonia    Renal insufficiency    Respiratory failure (HCC)      Assessment and Plan: 1. Chronic, continuous use of opioids   2. Chronic pain syndrome   3. DDD (degenerative disc disease), lumbar   4. Chronic bilateral low back pain without sciatica   5.  Facet arthritis of lumbar region   6. ESRD on dialysis ([place])   7. Guillain-Barre syndrome ([place])   8. Hip pain, chronic, left   9. Weakness of both lower extremities   Based on our discussion today and after review of the [facility] practitioner database information it is appropriate for refills for January 12 and February 11.  No other changes in his pharmacologic regimen will be initiated.  I want him to continue follow-up with his nephrologist primary care physicians and orthopedist for baseline medical care.  We will schedule him for 57-month return to clinic.  Follow Up Instructions:    I  discussed the assessment and treatment plan with the patient. The patient was provided an opportunity to ask questions and all were answered. The patient agreed with the plan and demonstrated an understanding of the instructions.   The patient was advised to call back or seek an in-person evaluation if the symptoms worsen or if the condition fails to improve as anticipated.  I provided 30 minutes of non-face-to-face time during this encounter.   Molli Barrows, MD

## 2021-05-26 ENCOUNTER — Telehealth: Payer: Self-pay | Admitting: Anesthesiology

## 2021-05-26 MED ORDER — OXYCODONE-ACETAMINOPHEN 10-325 MG PO TABS: 1.0000 | ORAL_TABLET | ORAL | 0 refills | Status: DC | PRN

## 2021-05-26 NOTE — Addendum Note (Signed)
Addended by: Molli Barrows on: 05/26/2021 03:45 PM   Modules accepted: Orders

## 2021-05-26 NOTE — Telephone Encounter (Signed)
Note placed on Dr Adams desk.  

## 2021-05-26 NOTE — Telephone Encounter (Signed)
Patient notified that another script of Oxy/Acet has been sent to pharmacy.

## 2021-05-26 NOTE — Telephone Encounter (Signed)
Patient called to schedule his next appt. Per Dr. Andree Elk orders he is scheduled for March. He does not have a script to fill for Feb. Please see if Dr Andree Elk will send in script for Feb. And advise patient.thank you

## 2021-06-27 ENCOUNTER — Ambulatory Visit: Payer: Medicare Other | Attending: Anesthesiology | Admitting: Anesthesiology

## 2021-06-27 ENCOUNTER — Encounter: Payer: Self-pay | Admitting: Anesthesiology

## 2021-06-27 ENCOUNTER — Other Ambulatory Visit: Payer: Self-pay

## 2021-06-27 VITALS — BP 100/74 | HR 86 | Temp 98.4°F | Resp 18 | Ht 74.0 in | Wt 250.0 lb

## 2021-06-27 DIAGNOSIS — G894 Chronic pain syndrome: Secondary | ICD-10-CM | POA: Diagnosis present

## 2021-06-27 DIAGNOSIS — M25552 Pain in left hip: Secondary | ICD-10-CM | POA: Diagnosis present

## 2021-06-27 DIAGNOSIS — G610 Guillain-Barre syndrome: Secondary | ICD-10-CM | POA: Insufficient documentation

## 2021-06-27 DIAGNOSIS — M47816 Spondylosis without myelopathy or radiculopathy, lumbar region: Secondary | ICD-10-CM | POA: Insufficient documentation

## 2021-06-27 DIAGNOSIS — M545 Low back pain, unspecified: Secondary | ICD-10-CM | POA: Diagnosis present

## 2021-06-27 DIAGNOSIS — Z992 Dependence on renal dialysis: Secondary | ICD-10-CM

## 2021-06-27 DIAGNOSIS — N186 End stage renal disease: Secondary | ICD-10-CM | POA: Insufficient documentation

## 2021-06-27 DIAGNOSIS — G8929 Other chronic pain: Secondary | ICD-10-CM | POA: Diagnosis present

## 2021-06-27 DIAGNOSIS — F1190 Opioid use, unspecified, uncomplicated: Secondary | ICD-10-CM | POA: Insufficient documentation

## 2021-06-27 DIAGNOSIS — R29898 Other symptoms and signs involving the musculoskeletal system: Secondary | ICD-10-CM | POA: Diagnosis present

## 2021-06-27 DIAGNOSIS — M5136 Other intervertebral disc degeneration, lumbar region: Secondary | ICD-10-CM | POA: Diagnosis present

## 2021-06-27 MED ORDER — OXYCODONE-ACETAMINOPHEN 10-325 MG PO TABS
1.0000 | ORAL_TABLET | Freq: Four times a day (QID) | ORAL | 0 refills | Status: DC | PRN
Start: 2021-07-04 — End: 2021-08-03

## 2021-06-27 MED ORDER — OXYCODONE-ACETAMINOPHEN 10-325 MG PO TABS: 1.0000 | ORAL_TABLET | Freq: Four times a day (QID) | ORAL | 0 refills | Status: DC | PRN

## 2021-06-27 NOTE — Progress Notes (Unsigned)
Nursing Pain Medication Assessment:  Safety precautions to be maintained throughout the outpatient stay will include: orient to surroundings, keep bed in low position, maintain call bell within reach at all times, provide assistance with transfer out of bed and ambulation.  Medication Inspection Compliance: Patrick Downs did not comply with our request to bring his pills to be counted. He was reminded that bringing the medication bottles, even when empty, is a requirement.  Medication: None brought in. Pill/Patch Count: None available to be counted. Bottle Appearance: No container available. Did not bring bottle(s) to appointment. Filled Date: N/A Last Medication intake:  {Blank single:19197::"Ran out of medicine more than 48 hours ago","Day before yesterday","Yesterday","Today"}

## 2021-07-06 LAB — OXYCODONES,MS,WB/SP RFX
Oxycocone: 15.6 ng/mL
Oxycodones Confirmation: POSITIVE
Oxymorphone: NEGATIVE ng/mL

## 2021-07-06 LAB — DRUG SCREEN 10 W/CONF, SERUM
Amphetamines, IA: NEGATIVE ng/mL
Barbiturates, IA: NEGATIVE ug/mL
Benzodiazepines, IA: NEGATIVE ng/mL
Cocaine & Metabolite, IA: NEGATIVE ng/mL
Methadone, IA: NEGATIVE ng/mL
Opiates, IA: NEGATIVE ng/mL
Oxycodones, IA: POSITIVE ng/mL — AB
Phencyclidine, IA: NEGATIVE ng/mL
Propoxyphene, IA: NEGATIVE ng/mL
THC(Marijuana) Metabolite, IA: NEGATIVE ng/mL

## 2021-07-07 ENCOUNTER — Encounter: Payer: Self-pay | Admitting: Anesthesiology

## 2021-07-07 NOTE — Progress Notes (Signed)
? ?Subjective:  ?Patient ID: Patrick Downs, male    DOB: 1975/06/13  Age: 46 y.o. MRN: [id number] ? ?CC: Back Pain (low) ? ? ?Procedure: None ? ?HPI ?Patrick Downs presents for reevaluation.  He is seen in clinic today and reports that he still having considerable amount of low back pain and leg pain comparable to what he had in the past.  No recent changes in the quality characteristic or distribution of the pain are noted.  He takes his medications and these do work well for him but frequently has breakthrough pain approximately 4 hours after his most recent dose.  He then has considerable amount of low back pain and lower extremity pain for about 2 hours prior to his next dose.  He has occasionally taken these up to 5 times a day to help with his breakthrough pain.  He states that he is compliant with the medications otherwise and he denies any diverting or illicit use.  Otherwise lower extremity strength and function and bowel bladder function have been stable.  He does require dialysis.  No side effects of the medication are noted. ? ?Outpatient Medications Prior to Visit  ?Medication Sig Dispense Refill  ? acetaminophen (TYLENOL) 500 MG tablet Take 1,000-1,500 mg by mouth 2 (two) times daily as needed for moderate pain or headache.    ? diphenhydrAMINE (BENADRYL) 25 mg capsule Take 2 tablets by mouth 1 hour before procedure    ? gabapentin (NEURONTIN) 300 MG capsule Take 600 mg by mouth at bedtime.     ? hydrOXYzine (ATARAX) 25 MG tablet Take 1 tablet by mouth 2 (two) times daily.    ? oxyCODONE-acetaminophen (PERCOCET) 10-325 MG tablet Take 1 tablet by mouth every 4 (four) hours as needed for pain. 135 tablet 0  ? AURYXIA 1 GM 210 MG(Fe) tablet Take 420 mg by mouth 3 (three) times daily. (Patient not taking: Reported on 06/27/2021)    ? midodrine (PROAMATINE) 5 MG tablet Take 5-10 mg by mouth See admin instructions. Take 10 mg by mouth before dialysis on Tuesdays, Thursdays, & Saturday and take 5 mg in the  evening on  Tuesdays, Thursdays, & Saturdays. ?On Monday, Wednesday, Friday and Sunday 5 mg by mouth every 8 hours. (Patient not taking: Reported on 06/27/2021)    ? ?No facility-administered medications prior to visit.  ? ? ?Review of Systems ?CNS: No confusion or sedation ?Cardiac: No angina or palpitations ?GI: No abdominal pain or constipation ?Constitutional: No nausea vomiting fevers or chills ? ?Objective:  ?BP 100/74   Pulse 86   Temp 98.4 ?F (36.9 ?C)   Resp 18   Ht 6\' 2"  (1.88 m)   Wt 250 lb (113.4 kg)   SpO2 99%   BMI 32.10 kg/m?  ? ? ?BP Readings from Last 3 Encounters:  ?06/27/21 100/74  ?05/20/20 (!) 134/91  ?04/08/20 110/71  ? ? ? ?Wt Readings from Last 3 Encounters:  ?06/27/21 250 lb (113.4 kg)  ?05/20/20 270 lb 11.6 oz (122.8 kg)  ?04/08/20 255 lb (115.7 kg)  ? ? ? ?Physical Exam ?Pt is alert and oriented ?PERRL EOMI ?HEART IS RRR no murmur or rub ?LCTA no wheezing or rales ?MUSCULOSKELETAL reveals some paraspinous muscle tenderness in the low back but no overt trigger points.  He ambulates with an antalgic gait lower extremity strength is at baseline muscle tone and bulk is at baseline. ? ?Labs ? ?Lab Results  ?Component Value Date  ? HGBA1C 5.4 05/14/2020  ? HGBA1C  5.7 (H) 01/26/2020  ? HGBA1C 5.5 08/16/2017  ? ?Lab Results  ?Component Value Date  ? LDLCALC 111 (H) 01/26/2020  ? CREATININE 13.61 (H) 05/20/2020  ? ? ?-------------------------------------------------------------------------------------------------------------------- ?Lab Results  ?Component Value Date  ? WBC 6.8 05/20/2020  ? HGB 8.9 (L) 05/20/2020  ? HCT 28.1 (L) 05/20/2020  ? PLT 160 05/20/2020  ? GLUCOSE 91 05/20/2020  ? CHOL 162 01/26/2020  ? TRIG 99 01/26/2020  ? HDL 31 (L) 01/26/2020  ? LDLCALC 111 (H) 01/26/2020  ? ALT 15 05/15/2020  ? AST 12 (L) 05/15/2020  ? NA 138 05/20/2020  ? K 4.8 05/20/2020  ? CL 98 05/20/2020  ? CREATININE 13.61 (H) 05/20/2020  ? BUN 68 (H) 05/20/2020  ? CO2 25 05/20/2020  ? TSH 2.932 08/08/2019   ? INR 1.2 05/14/2020  ? HGBA1C 5.4 05/14/2020  ? ? ?--------------------------------------------------------------------------------------------------------------------- ?DG Chest Port 1 View ? ?Result Date: 05/14/2020 ?CLINICAL DATA:  Sepsis. EXAM: PORTABLE CHEST 1 VIEW COMPARISON:  January 25, 2020. FINDINGS: The heart size and mediastinal contours are within normal limits. Both lungs are clear. No pneumothorax or pleural effusion is noted. Right internal jugular dialysis catheter is unchanged in position. The visualized skeletal structures are unremarkable. IMPRESSION: No active disease. Electronically Signed   By: Marijo Conception M.D.   On: 05/14/2020 13:40  ? ?[place] RT UPPER EXTREM LTD SOFT TISSUE NON VASCULAR ? ?Result Date: 05/14/2020 ?CLINICAL DATA:  Right arm abscess status post AV fistula EXAM: ULTRASOUND RIGHT UPPER EXTREMITY LIMITED TECHNIQUE: Ultrasound examination of the upper extremity soft tissues was performed in the area of clinical concern. COMPARISON:  None. FINDINGS: Status post AV fistula creation. Severe soft tissue edema around the [facility] fistula site. Hypoechoic areas around the fistula which may reflect severe cellulitis or phlegmon. Flow is seen throughout the vessels of the fistula 0 without thrombosis. No other solid or cystic mass. IMPRESSION: Severe soft tissue edema around the [facility] fistula site. Hypoechoic areas around the fistula which may reflect severe cellulitis or phlegmon. If there is further clinical concern, recommend a CT of the arm with contrast. Electronically Signed   By: Kathreen Devoid   On: 05/14/2020 16:11  ? ? ? ?Assessment & Plan:  ? ?Patrick Downs was seen today for back pain. ? ?Diagnoses and all orders for this visit: ? ?Chronic, continuous use of opioids ?-     Drug Screen 10 W/Conf, Serum ? ?Chronic pain syndrome ?-     Drug Screen 10 W/Conf, Serum ? ?DDD (degenerative disc disease), lumbar ? ?Chronic bilateral low back pain without sciatica ? ?Facet arthritis of lumbar  region ? ?ESRD on dialysis [facility]) ? 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 ?Hip pain, chronic, left ? ?Weakness of both lower extremities ? ?Other orders ?-     oxyCODONE-acetaminophen (PERCOCET) 10-325 MG tablet; Take 1 tablet by mouth every 6 (six) hours as needed for pain. ?-     oxyCODONE-acetaminophen (PERCOCET) 10-325 MG tablet; Take 1 tablet by mouth every 6 (six) hours as needed for pain. ?-     Oxycodones,MS,WB/Sp Rfx ? ? ?   ? ? ?---------------------------------------------------------------------------------------------------------------------- ? ?Problem List Items Addressed This Visit   ? ?  ? Unprioritized  ? ESRD on dialysis [facility])  ? Hip pain, chronic, left  ? Relevant Medications  ? oxyCODONE-acetaminophen (PERCOCET) 10-325 MG tablet  ? oxyCODONE-acetaminophen (PERCOCET) 10-325 MG tablet (Start on 08/03/2021)  ? Peripheral neuropathy  ? Relevant Medications  ? hydrOXYzine (ATARAX) 25 MG tablet  ? ?Other Visit Diagnoses   ? ?  Chronic, continuous use of opioids    -  Primary  ? Relevant Orders  ? Drug Screen 10 W/Conf, Serum (Completed)  ? Chronic pain syndrome      ? Relevant Medications  ? oxyCODONE-acetaminophen (PERCOCET) 10-325 MG tablet  ? oxyCODONE-acetaminophen (PERCOCET) 10-325 MG tablet (Start on 08/03/2021)  ? Other Relevant Orders  ? Drug Screen 10 W/Conf, Serum (Completed)  ? DDD (degenerative disc disease), lumbar      ? Relevant Medications  ? oxyCODONE-acetaminophen (PERCOCET) 10-325 MG tablet  ? oxyCODONE-acetaminophen (PERCOCET) 10-325 MG tablet (Start on 08/03/2021)  ? Chronic bilateral low back pain without sciatica      ? Relevant Medications  ? oxyCODONE-acetaminophen (PERCOCET) 10-325 MG tablet  ? oxyCODONE-acetaminophen (PERCOCET) 10-325 MG tablet (Start on 08/03/2021)  ? Facet arthritis of lumbar region      ? Relevant Medications  ? oxyCODONE-acetaminophen (PERCOCET) 10-325 MG tablet  ? oxyCODONE-acetaminophen (PERCOCET) 10-325 MG tablet (Start on 08/03/2021)  ? Weakness of both lower  extremities      ? ?  ? ? ? ? ?---------------------------------------------------------------------------------------------------------------------- ? ?1. Chronic, continuous use of opioids ?I have reviewed the [employer]

## 2021-07-12 ENCOUNTER — Emergency Department
Admission: EM | Admit: 2021-07-12 | Discharge: 2021-07-12 | Disposition: A | Discharge status: Home / Self Care | Payer: Medicare Other | Attending: Emergency Medicine | Admitting: Emergency Medicine

## 2021-07-12 ENCOUNTER — Encounter: Payer: Self-pay | Admitting: Emergency Medicine

## 2021-07-12 ENCOUNTER — Other Ambulatory Visit: Payer: Self-pay

## 2021-07-12 ENCOUNTER — Emergency Department: Payer: Medicare Other

## 2021-07-12 DIAGNOSIS — J069 Acute upper respiratory infection, unspecified: Secondary | ICD-10-CM | POA: Diagnosis not present

## 2021-07-12 DIAGNOSIS — I509 Heart failure, unspecified: Secondary | ICD-10-CM | POA: Diagnosis not present

## 2021-07-12 DIAGNOSIS — N186 End stage renal disease: Secondary | ICD-10-CM | POA: Diagnosis not present

## 2021-07-12 DIAGNOSIS — Z28310 Unvaccinated for covid-19: Secondary | ICD-10-CM | POA: Diagnosis not present

## 2021-07-12 DIAGNOSIS — I132 Hypertensive heart and chronic kidney disease with heart failure and with stage 5 chronic kidney disease, or end stage renal disease: Secondary | ICD-10-CM | POA: Insufficient documentation

## 2021-07-12 DIAGNOSIS — Z20822 Contact with and (suspected) exposure to covid-19: Secondary | ICD-10-CM | POA: Insufficient documentation

## 2021-07-12 DIAGNOSIS — R059 Cough, unspecified: Secondary | ICD-10-CM | POA: Diagnosis present

## 2021-07-12 LAB — RESP PANEL BY RT-PCR (FLU A&B, COVID) ARPGX2
Influenza A by PCR: NEGATIVE
Influenza B by PCR: NEGATIVE
SARS Coronavirus 2 by RT PCR: NEGATIVE

## 2021-07-12 LAB — GROUP A STREP BY PCR: Group A Strep by PCR: NOT DETECTED

## 2021-07-12 MED ORDER — PSEUDOEPH-BROMPHEN-DM 30-2-10 MG/5ML PO SYRP: 5.0000 mL | ORAL_SOLUTION | Freq: Four times a day (QID) | ORAL | 0 refills | Status: DC | PRN

## 2021-07-12 NOTE — ED Provider Notes (Signed)
? ?[facility] ?Provider Note ? ? ? Event Date/Time  ? First MD Initiated Contact with Patient 07/12/21 [phone]   ?  (approximate) ? ? ?History  ? ?Fever, Cough, Sore Throat, and Nasal Congestion ? ? ?HPI ? ?Patrick Downs is a 46 y.o. male   presents to the ED with complaint of upper respiratory symptoms.  Patient states that he has had nasal congestion, runny nose, cough and fever for approximately 1 week.  Patient is a dialysis patient and due to his temperature was told to come to the emergency department.  Patient denies any nausea, vomiting or diarrhea.  He reports that he took Tylenol this morning at 7 AM for his fever.  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  Patient did not get the COVID-vaccine.  He rates his pain as 7 out of 10 currently. ? ?  ? ? ?Physical Exam  ? ?Triage Vital Signs: ?ED Triage Vitals  ?Enc Vitals Group  ?   BP 07/12/21 0754 128/90  ?   Pulse Rate 07/12/21 0752 (!) 104  ?   Resp 07/12/21 0752 (!) 22  ?   Temp 07/12/21 0752 (!) 100.6 ?F (38.1 ?C)  ?   Temp Source 07/12/21 0752 Oral  ?   SpO2 07/12/21 0752 91 %  ?   Weight 07/12/21 0754 255 lb (115.7 kg)  ?   Height 07/12/21 0754 6\' 2"  (1.88 m)  ?   Head Circumference --   ?   Peak Flow --   ?   Pain Score 07/12/21 0754 7  ?   Pain Loc --   ?   Pain Edu? --   ?   Excl. in [place]? --   ? ? ?Most recent vital signs: ?Vitals:  ? 07/12/21 0754 07/12/21 0858  ?BP: 128/90 130/88  ?Pulse:  99  ?Resp:  20  ?Temp:  99.2 ?F (37.3 ?C)  ?SpO2:  93%  ? ? ? ?General: Awake, no distress.  Able to talk in short sentences. ?CV:  Good peripheral perfusion.  Heart regular rate and rhythm. ?Resp:  Normal effort.  Lungs no wheezes are noted.  Patient does have a very congested cough but no rales or rhonchi are noted. ?Abd:  No distention.  ?Other:  Mild nasal congestion present.  Posterior pharynx without erythema or  exudate. ? ? ?ED Results / Procedures / Treatments  ? ?Labs ?(all labs ordered are listed, but only abnormal results are displayed) ?Labs Reviewed  ?RESP PANEL BY RT-PCR (FLU A&B, COVID) [id number]  ?GROUP A STREP BY PCR  ? ? ? ?RADIOLOGY ?Chest x-ray 2 views images were reviewed by myself and radiology report reviewed.  There were no signs of pulmonary edema or pulmonary consolidation. ? ? ? ?PROCEDURES: ? ?Critical Care performed:  ? ?Procedures ? ? ?MEDICATIONS ORDERED IN ED: ?Medications - No data to display ? ? ?IMPRESSION / MDM / ASSESSMENT AND PLAN / ED COURSE  ?I reviewed the triage vital signs and the nursing notes. ? ? ?Differential diagnosis includes, but is not limited to, viral URI with congestion, COVID, influenza, strep pharyngitis, CHF, pneumonia. ? ? ?46 year old male presents to the ED after being seen at the dialysis center and told that he had to come to the emergency room to be evaluated as he has a temperature.  Patient states he started feeling bad approximately 1 week ago with rhinorrhea and  cough.  Patient states that he felt well for his dialysis treatment on Saturday.  Physical exam as above.  Patient was reassured when his respiratory panel came back with negative COVID and influenza.  His strep test was also negative.  Chest x-ray did not show pneumonia or pulmonary edema.  Patient was told to continue with saline nose spray for nasal congestion and a prescription for Bromfed-DM was sent to the pharmacy as needed for nasal congestion and cough.  A call was made by Halford Decamp, RN to the dialysis unit who stated that they will see him Thursday for his regular scheduled dialysis as they do not have an opening for him today. ? ? ?  ? ? ?FINAL CLINICAL IMPRESSION(S) / ED DIAGNOSES  ? ?Final diagnoses:  ?Viral URI with cough  ? ? ? ?Rx / DC Orders  ? ?ED Discharge Orders   ? ?      Ordered  ?  brompheniramine-pseudoephedrine-DM 30-2-10 MG/5ML syrup  4 times daily PRN       ? 07/12/21 0935  ? ?   ?  ? ?  ? ? ? ?Note:  This document was prepared using Dragon voice recognition software and may include unintentional dictation errors. ?  ?Johnn Hai, PA-C ?07/12/21 1720 ? ?  ?Arta Silence, MD ?07/24/21 2302 ? ?

## 2021-07-12 NOTE — ED Notes (Signed)
Spoke with Debbie at dialysis center  states they can take him on Thursday at him normal time  Pt is informed of same ?

## 2021-07-12 NOTE — ED Triage Notes (Signed)
Pt to  ED via POV with c/o fever cough, sore throat and sinus congestion that started a week ago ?

## 2021-07-12 NOTE — Discharge Instructions (Signed)
A prescription for Bromfed-DM was sent to the pharmacy as needed for nasal congestion and cough.  Drink lots of fluids to stay hydrated and Tylenol if needed for fever.  The dialysis center [address] advised for you to return to the center on Thursday for your scheduled dialysis.  Your COVID and influenza test was negative.  Also your strep test is negative.  This is felt to be a viral illness and should run its course. ?

## 2021-07-12 NOTE — ED Notes (Signed)
See triage note  presents with low grade fever,body aches,and h/a's  states sx's started about 1 week ago  was scheduled for dialysis treatment today  SON on exertion ?

## 2021-08-01 ENCOUNTER — Telehealth: Payer: Self-pay | Admitting: Anesthesiology

## 2021-08-01 NOTE — Telephone Encounter (Signed)
Please check on script that is due to fill on Wed 08-03-21. He says it should be for 135 not 120. Please advise patient. ?

## 2021-08-02 NOTE — Telephone Encounter (Signed)
Will ask Dr. Andree Elk this afternoon  ?

## 2021-08-02 NOTE — Telephone Encounter (Signed)
Dr. Andree Elk will send new script tomorrow. Attempted to notify patient, mailbox is full. Current Rx cancelled at pharmacy. ?

## 2021-08-03 MED ORDER — OXYCODONE-ACETAMINOPHEN 10-325 MG PO TABS: 1.0000 | ORAL_TABLET | ORAL | 0 refills | Status: AC | PRN

## 2021-08-03 MED ORDER — OXYCODONE-ACETAMINOPHEN 10-325 MG PO TABS: 1.0000 | ORAL_TABLET | ORAL | 0 refills | Status: DC | PRN

## 2021-08-03 NOTE — Addendum Note (Signed)
Addended by: Molli Barrows on: 08/03/2021 09:18 AM ? ? Modules accepted: Orders ? ?

## 2021-08-03 NOTE — Telephone Encounter (Signed)
Done per Dr. Andree Elk 08/03/2021 ?

## 2021-08-18 ENCOUNTER — Ambulatory Visit: Payer: Medicare Other | Attending: Anesthesiology | Admitting: Anesthesiology

## 2021-08-18 ENCOUNTER — Encounter: Payer: Self-pay | Admitting: Anesthesiology

## 2021-08-18 DIAGNOSIS — M545 Low back pain, unspecified: Secondary | ICD-10-CM | POA: Diagnosis not present

## 2021-08-18 DIAGNOSIS — M25552 Pain in left hip: Secondary | ICD-10-CM

## 2021-08-18 DIAGNOSIS — G894 Chronic pain syndrome: Secondary | ICD-10-CM | POA: Diagnosis not present

## 2021-08-18 DIAGNOSIS — F1190 Opioid use, unspecified, uncomplicated: Secondary | ICD-10-CM

## 2021-08-18 DIAGNOSIS — M5136 Other intervertebral disc degeneration, lumbar region: Secondary | ICD-10-CM

## 2021-08-18 DIAGNOSIS — N186 End stage renal disease: Secondary | ICD-10-CM

## 2021-08-18 DIAGNOSIS — G8929 Other chronic pain: Secondary | ICD-10-CM

## 2021-08-18 DIAGNOSIS — Z992 Dependence on renal dialysis: Secondary | ICD-10-CM

## 2021-08-18 DIAGNOSIS — M47816 Spondylosis without myelopathy or radiculopathy, lumbar region: Secondary | ICD-10-CM

## 2021-08-18 DIAGNOSIS — R29898 Other symptoms and signs involving the musculoskeletal system: Secondary | ICD-10-CM

## 2021-08-18 MED ORDER — OXYCODONE-ACETAMINOPHEN 10-325 MG PO TABS: 1.0000 | ORAL_TABLET | ORAL | 0 refills | Status: DC | PRN

## 2021-08-18 NOTE — Progress Notes (Signed)
Virtual Visit via Telephone Note ? ?I connected with Patrick Downs on 08/18/21 at 11:20 AM EDT by telephone and verified that I am speaking with the correct person using two identifiers. ? ?Location: ?Patient: Home ?Provider: Pain control center ?  ?I discussed the limitations, risks, security and privacy concerns of performing an evaluation and management service by telephone and the availability of in person appointments. I also discussed with the patient that there may be a patient responsible charge related to this service. The patient expressed understanding and agreed to proceed. ? ? ?History of Present Illness: ?I spoke with Patrick Downs today via telephone.  We were unable link for the video portion of this conference and he states that his low back pain is stable in nature with no recent changes.  The quality characteristic and distribution of been stable.  He still gets some bilateral lower extremity pain and some occasional hip pain with a recent work-up revealing osteoarthritis.  He still has chronic weakness in his legs and continues follow-up with his nephrologist secondary to the end-stage renal disease.  He is taking his medications approximately 4 hours and these continue to work well for him.  He reports about 70 to 75% relief with the medication lasting about 4 hours before he has recurrence of his chronic baseline pain.  With the medication he states he is more active and is trying to do some physical therapy with efforts at weight loss.  No other changes are noted today.  No side effects with the medications are reported. ? ?Review of systems: ?General: No fevers or chills ?Pulmonary: No shortness of breath or dyspnea ?Cardiac: No angina or palpitations or lightheadedness ?GI: No abdominal pain or constipation ?Psych: No depression  ?  ?Observations/Objective: ? ?Current Outpatient Medications:  ?  [START ON 10/01/2021] oxyCODONE-acetaminophen (PERCOCET) 10-325 MG tablet, Take 1 tablet by mouth  every 4 (four) hours as needed for pain., Disp: 135 tablet, Rfl: 0 ?  acetaminophen (TYLENOL) 500 MG tablet, Take 1,000-1,500 mg by mouth 2 (two) times daily as needed for moderate pain or headache., Disp: , Rfl:  ?  AURYXIA 1 GM 210 MG(Fe) tablet, Take 420 mg by mouth 3 (three) times daily. (Patient not taking: Reported on 06/27/2021), Disp: , Rfl:  ?  brompheniramine-pseudoephedrine-DM 30-2-10 MG/5ML syrup, Take 5 mLs by mouth 4 (four) times daily as needed., Disp: 120 mL, Rfl: 0 ?  diphenhydrAMINE (BENADRYL) 25 mg capsule, Take 2 tablets by mouth 1 hour before procedure, Disp: , Rfl:  ?  gabapentin (NEURONTIN) 300 MG capsule, Take 600 mg by mouth at bedtime. , Disp: , Rfl:  ?  hydrOXYzine (ATARAX) 25 MG tablet, Take 1 tablet by mouth 2 (two) times daily., Disp: , Rfl:  ?  midodrine (PROAMATINE) 5 MG tablet, Take 5-10 mg by mouth See admin instructions. Take 10 mg by mouth before dialysis on Tuesdays, Thursdays, & Saturday and take 5 mg in the evening on  Tuesdays, Thursdays, & Saturdays. On Monday, Wednesday, Friday and Sunday 5 mg by mouth every 8 hours. (Patient not taking: Reported on 06/27/2021), Disp: , Rfl:  ?  [START ON 09/02/2021] oxyCODONE-acetaminophen (PERCOCET) 10-325 MG tablet, Take 1 tablet by mouth every 4 (four) hours as needed for pain., Disp: 135 tablet, Rfl: 0 ?  [START ON 09/02/2021] oxyCODONE-acetaminophen (PERCOCET) 10-325 MG tablet, Take 1 tablet by mouth every 4 (four) hours as needed for pain., Disp: 135 tablet, Rfl: 0  ? ?Past Medical History:  ?Diagnosis Date  ? CHF (  congestive heart failure) ([place])   ? Degenerative disc disease, lumbar   ? Depression   ? Dialysis patient [facility])   ? ESRD (end stage renal disease) on dialysis [facility])   ? Failure to thrive in adult   ? GERD (gastroesophageal reflux disease)   ? Gout   ? Guillain Barr? syndrome ([place])   ? Guillain Barr? syndrome ([place])   ? Heart murmur   ? Hip pain, chronic, left 12/24/2020  ? HTN (hypertension)   ? Hyperparathyroidism ([place])   ? Kidney  failure   ? MRSA (methicillin resistant staph aureus) culture positive   ? Peripheral neuropathy   ? Pneumonia   ? Renal insufficiency   ? Respiratory failure ([place])   ?  ? ?Assessment and Plan: ?1. Chronic, continuous use of opioids   ?2. Chronic pain syndrome   ?3. DDD (degenerative disc disease), lumbar   ?4. Chronic bilateral low back pain without sciatica   ?5. Facet arthritis of lumbar region   ?6. ESRD on dialysis [facility])   ?7. Hip pain, chronic, left   ?8. Weakness of both lower extremities   ?Based on our discussion today and after review of the [facility] practitioner database information it is appropriate to refill his medicines dated for May 12 and June 10.  No other changes in his pharmacologic regimen will be initiated at this time.  He seems to be doing very well with the chronic opioid therapy with no side effects reported and continued pain relief for his intractable low back pain and lower extremity pain.  I want her to continue efforts at weight loss physical therapy as reviewed and continue follow-up with his primary care physicians in addition to his nephrologist.  We will schedule him for return to clinic in 2 months ? ?Follow Up Instructions: ? ?  ?I discussed the assessment and treatment plan with the patient. The patient was provided an opportunity to ask questions and all were answered. The patient agreed with the plan and demonstrated an understanding of the instructions. ?  ?The patient was advised to call back or seek an in-person evaluation if the symptoms worsen or if the condition fails to improve as anticipated. ? ?I provided 30 minutes of non-face-to-face time during this encounter. ? ? ?Molli Barrows, MD  ?

## 2021-09-16 ENCOUNTER — Emergency Department: Payer: Medicare Other

## 2021-09-16 ENCOUNTER — Inpatient Hospital Stay
Admission: EM | Admit: 2021-09-16 | Discharge: 2021-09-19 | DRG: 640 | Disposition: A | Discharge status: Home / Self Care | Payer: Medicare Other | Attending: Student in an Organized Health Care Education/Training Program | Admitting: Student in an Organized Health Care Education/Training Program

## 2021-09-16 ENCOUNTER — Other Ambulatory Visit: Payer: Self-pay

## 2021-09-16 ENCOUNTER — Encounter: Payer: Self-pay | Admitting: Radiology

## 2021-09-16 DIAGNOSIS — F32A Depression, unspecified: Secondary | ICD-10-CM | POA: Diagnosis present

## 2021-09-16 DIAGNOSIS — I509 Heart failure, unspecified: Secondary | ICD-10-CM | POA: Diagnosis present

## 2021-09-16 DIAGNOSIS — G610 Guillain-Barre syndrome: Secondary | ICD-10-CM | POA: Diagnosis present

## 2021-09-16 DIAGNOSIS — R9431 Abnormal electrocardiogram [ECG] [EKG]: Secondary | ICD-10-CM | POA: Diagnosis present

## 2021-09-16 DIAGNOSIS — K219 Gastro-esophageal reflux disease without esophagitis: Secondary | ICD-10-CM | POA: Diagnosis present

## 2021-09-16 DIAGNOSIS — Z91041 Radiographic dye allergy status: Secondary | ICD-10-CM

## 2021-09-16 DIAGNOSIS — M545 Low back pain, unspecified: Secondary | ICD-10-CM

## 2021-09-16 DIAGNOSIS — E1142 Type 2 diabetes mellitus with diabetic polyneuropathy: Secondary | ICD-10-CM | POA: Diagnosis present

## 2021-09-16 DIAGNOSIS — Z885 Allergy status to narcotic agent status: Secondary | ICD-10-CM

## 2021-09-16 DIAGNOSIS — N186 End stage renal disease: Secondary | ICD-10-CM

## 2021-09-16 DIAGNOSIS — M109 Gout, unspecified: Secondary | ICD-10-CM | POA: Diagnosis present

## 2021-09-16 DIAGNOSIS — Z992 Dependence on renal dialysis: Secondary | ICD-10-CM | POA: Diagnosis not present

## 2021-09-16 DIAGNOSIS — G8929 Other chronic pain: Secondary | ICD-10-CM | POA: Diagnosis present

## 2021-09-16 DIAGNOSIS — Z887 Allergy status to serum and vaccine status: Secondary | ICD-10-CM | POA: Diagnosis not present

## 2021-09-16 DIAGNOSIS — Z888 Allergy status to other drugs, medicaments and biological substances status: Secondary | ICD-10-CM

## 2021-09-16 DIAGNOSIS — E213 Hyperparathyroidism, unspecified: Secondary | ICD-10-CM | POA: Diagnosis present

## 2021-09-16 DIAGNOSIS — N529 Male erectile dysfunction, unspecified: Secondary | ICD-10-CM | POA: Diagnosis present

## 2021-09-16 DIAGNOSIS — Z79899 Other long term (current) drug therapy: Secondary | ICD-10-CM | POA: Diagnosis not present

## 2021-09-16 DIAGNOSIS — E875 Hyperkalemia: Secondary | ICD-10-CM | POA: Diagnosis present

## 2021-09-16 DIAGNOSIS — E1122 Type 2 diabetes mellitus with diabetic chronic kidney disease: Secondary | ICD-10-CM | POA: Diagnosis present

## 2021-09-16 DIAGNOSIS — E8770 Fluid overload, unspecified: Principal | ICD-10-CM

## 2021-09-16 DIAGNOSIS — I440 Atrioventricular block, first degree: Secondary | ICD-10-CM | POA: Diagnosis present

## 2021-09-16 DIAGNOSIS — I10 Essential (primary) hypertension: Secondary | ICD-10-CM | POA: Diagnosis not present

## 2021-09-16 DIAGNOSIS — I120 Hypertensive chronic kidney disease with stage 5 chronic kidney disease or end stage renal disease: Secondary | ICD-10-CM | POA: Diagnosis present

## 2021-09-16 DIAGNOSIS — E1140 Type 2 diabetes mellitus with diabetic neuropathy, unspecified: Secondary | ICD-10-CM

## 2021-09-16 LAB — CBC WITH DIFFERENTIAL/PLATELET
Abs Immature Granulocytes: 0.01 10*3/uL (ref 0.00–0.07)
Basophils Absolute: 0 10*3/uL (ref 0.0–0.1)
Basophils Relative: 1 %
Eosinophils Absolute: 0.2 10*3/uL (ref 0.0–0.5)
Eosinophils Relative: 3 %
HCT: 35.9 % — ABNORMAL LOW (ref 39.0–52.0)
Hemoglobin: 10.9 g/dL — ABNORMAL LOW (ref 13.0–17.0)
Immature Granulocytes: 0 %
Lymphocytes Relative: 20 %
Lymphs Abs: 1.1 10*3/uL (ref 0.7–4.0)
MCH: 27.3 pg (ref 26.0–34.0)
MCHC: 30.4 g/dL (ref 30.0–36.0)
MCV: 89.8 fL (ref 80.0–100.0)
Monocytes Absolute: 0.5 10*3/uL (ref 0.1–1.0)
Monocytes Relative: 10 %
Neutro Abs: 3.7 10*3/uL (ref 1.7–7.7)
Neutrophils Relative %: 66 %
Platelets: 81 10*3/uL — ABNORMAL LOW (ref 150–400)
RBC: 4 MIL/uL — ABNORMAL LOW (ref 4.22–5.81)
RDW: 19.7 % — ABNORMAL HIGH (ref 11.5–15.5)
WBC: 5.6 10*3/uL (ref 4.0–10.5)
nRBC: 0 % (ref 0.0–0.2)

## 2021-09-16 LAB — COMPREHENSIVE METABOLIC PANEL
ALT: 41 U/L (ref 0–44)
AST: 37 U/L (ref 15–41)
Albumin: 4.5 g/dL (ref 3.5–5.0)
Alkaline Phosphatase: 67 U/L (ref 38–126)
Anion gap: 16 — ABNORMAL HIGH (ref 5–15)
BUN: 79 mg/dL — ABNORMAL HIGH (ref 6–20)
CO2: 21 mmol/L — ABNORMAL LOW (ref 22–32)
Calcium: 6.7 mg/dL — ABNORMAL LOW (ref 8.9–10.3)
Chloride: 104 mmol/L (ref 98–111)
Creatinine, Ser: 17.39 mg/dL — ABNORMAL HIGH (ref 0.61–1.24)
GFR, Estimated: 3 mL/min — ABNORMAL LOW (ref >60)
Glucose, Bld: 94 mg/dL (ref 70–99)
Potassium: >7.5 mmol/L (ref 3.5–5.1)
Sodium: 141 mmol/L (ref 135–145)
Total Bilirubin: 1 mg/dL (ref 0.3–1.2)
Total Protein: 7.8 g/dL (ref 6.5–8.1)

## 2021-09-16 LAB — CBG MONITORING, ED: Glucose-Capillary: 152 mg/dL — ABNORMAL HIGH (ref 70–99)

## 2021-09-16 LAB — PHOSPHORUS: Phosphorus: 5.4 mg/dL — ABNORMAL HIGH (ref 2.5–4.6)

## 2021-09-16 LAB — PROCALCITONIN: Procalcitonin: 0.59 ng/mL

## 2021-09-16 LAB — LIPASE, BLOOD: Lipase: 37 U/L (ref 11–51)

## 2021-09-16 MED ORDER — TRAZODONE HCL 50 MG PO TABS: 25.0000 mg | ORAL_TABLET | Freq: Every evening | ORAL | Status: DC | PRN

## 2021-09-16 MED ORDER — OXYCODONE HCL 5 MG PO TABS
5.0000 mg | ORAL_TABLET | ORAL | Status: DC | PRN
  Administered 2021-09-17 – 2021-09-19 (×3): 5 mg via ORAL
  Filled 2021-09-16 (×3): qty 1

## 2021-09-16 MED ORDER — FENTANYL CITRATE PF 50 MCG/ML IJ SOSY: 25.0000 ug | PREFILLED_SYRINGE | INTRAMUSCULAR | Status: DC | PRN

## 2021-09-16 MED ORDER — HEPARIN SODIUM (PORCINE) 1000 UNIT/ML IJ SOLN
INTRAMUSCULAR | Status: AC
  Filled 2021-09-16: qty 10

## 2021-09-16 MED ORDER — GABAPENTIN 300 MG PO CAPS
600.0000 mg | ORAL_CAPSULE | Freq: Every day | ORAL | Status: DC
  Administered 2021-09-17 – 2021-09-18 (×3): 600 mg via ORAL
  Filled 2021-09-16 (×3): qty 2

## 2021-09-16 MED ORDER — HEPARIN SODIUM (PORCINE) 5000 UNIT/ML IJ SOLN
5000.0000 [IU] | Freq: Three times a day (TID) | INTRAMUSCULAR | Status: DC
  Administered 2021-09-17 – 2021-09-18 (×3): 5000 [IU] via SUBCUTANEOUS
  Filled 2021-09-16 (×4): qty 1

## 2021-09-16 MED ORDER — HYDROXYZINE HCL 25 MG PO TABS
25.0000 mg | ORAL_TABLET | Freq: Two times a day (BID) | ORAL | Status: DC
  Administered 2021-09-17 – 2021-09-19 (×6): 25 mg via ORAL
  Filled 2021-09-16 (×6): qty 1

## 2021-09-16 MED ORDER — OXYCODONE-ACETAMINOPHEN 5-325 MG PO TABS
1.0000 | ORAL_TABLET | ORAL | Status: DC | PRN
Start: 2021-09-16 — End: 2021-09-19
  Administered 2021-09-17 – 2021-09-19 (×3): 1 via ORAL
  Filled 2021-09-16 (×3): qty 1

## 2021-09-16 MED ORDER — ALBUTEROL SULFATE (2.5 MG/3ML) 0.083% IN NEBU
10.0000 mg | INHALATION_SOLUTION | Freq: Once | RESPIRATORY_TRACT | Status: AC
  Administered 2021-09-16: 10 mg via RESPIRATORY_TRACT
  Filled 2021-09-16: qty 12

## 2021-09-16 MED ORDER — CALCITRIOL 0.25 MCG PO CAPS
0.2500 ug | ORAL_CAPSULE | Freq: Every day | ORAL | Status: DC
  Administered 2021-09-16 – 2021-09-19 (×3): 0.25 ug via ORAL
  Filled 2021-09-16 (×4): qty 1

## 2021-09-16 MED ORDER — CHLORHEXIDINE GLUCONATE CLOTH 2 % EX PADS
6.0000 | MEDICATED_PAD | Freq: Every day | CUTANEOUS | Status: DC
  Administered 2021-09-19: 6 via TOPICAL
  Filled 2021-09-16: qty 6

## 2021-09-16 MED ORDER — OXYCODONE-ACETAMINOPHEN 10-325 MG PO TABS: 1.0000 | ORAL_TABLET | ORAL | Status: DC | PRN

## 2021-09-16 MED ORDER — FUROSEMIDE 10 MG/ML IJ SOLN
60.0000 mg | Freq: Two times a day (BID) | INTRAMUSCULAR | Status: DC
  Filled 2021-09-16: qty 8
  Filled 2021-09-16 (×2): qty 6
  Filled 2021-09-16: qty 8

## 2021-09-16 MED ORDER — MAGNESIUM HYDROXIDE 400 MG/5ML PO SUSP: 30.0000 mL | Freq: Every day | ORAL | Status: DC | PRN

## 2021-09-16 MED ORDER — HEPARIN SODIUM (PORCINE) 1000 UNIT/ML DIALYSIS: 20.0000 [IU]/kg | INTRAMUSCULAR | Status: DC | PRN

## 2021-09-16 MED ORDER — DEXTROSE 50 % IV SOLN
1.0000 | Freq: Once | INTRAVENOUS | Status: AC
  Administered 2021-09-16: 50 mL via INTRAVENOUS
  Filled 2021-09-16: qty 50

## 2021-09-16 MED ORDER — HEPARIN SODIUM (PORCINE) 1000 UNIT/ML DIALYSIS
20.0000 [IU]/kg | INTRAMUSCULAR | Status: DC | PRN
  Filled 2021-09-16: qty 3

## 2021-09-16 MED ORDER — ACETAMINOPHEN 325 MG PO TABS: 650.0000 mg | ORAL_TABLET | Freq: Four times a day (QID) | ORAL | Status: DC | PRN

## 2021-09-16 MED ORDER — ACETAMINOPHEN 650 MG RE SUPP: 650.0000 mg | Freq: Four times a day (QID) | RECTAL | Status: DC | PRN

## 2021-09-16 MED ORDER — CALCIUM GLUCONATE 10 % IV SOLN
1.0000 g | Freq: Once | INTRAVENOUS | Status: AC
  Administered 2021-09-16: 1 g via INTRAVENOUS
  Filled 2021-09-16: qty 10

## 2021-09-16 MED ORDER — SODIUM ZIRCONIUM CYCLOSILICATE 10 G PO PACK
10.0000 g | PACK | Freq: Once | ORAL | Status: AC
  Administered 2021-09-16: 10 g via ORAL
  Filled 2021-09-16: qty 1

## 2021-09-16 MED ORDER — CALCIUM CARBONATE 1250 (500 CA) MG PO TABS
1250.0000 mg | ORAL_TABLET | Freq: Two times a day (BID) | ORAL | Status: DC
  Administered 2021-09-17 (×2): 1250 mg via ORAL
  Filled 2021-09-16 (×6): qty 1

## 2021-09-16 MED ORDER — INSULIN ASPART 100 UNIT/ML IV SOLN
10.0000 [IU] | Freq: Once | INTRAVENOUS | Status: AC
  Administered 2021-09-16: 10 [IU] via INTRAVENOUS
  Filled 2021-09-16: qty 0.1

## 2021-09-16 MED ORDER — SODIUM CHLORIDE 0.9 % IV SOLN
12.5000 mg | Freq: Four times a day (QID) | INTRAVENOUS | Status: DC | PRN
Start: 2021-09-16 — End: 2021-09-19

## 2021-09-16 NOTE — Assessment & Plan Note (Addendum)
-   This is clearly secondary to missing hemodialysis on Tuesday. - The patient admitted to a progressive unit bed. - Dr. Candiss Norse was notified and is aware about the patient. - The patient is getting hemodialysis this evening.

## 2021-09-16 NOTE — Progress Notes (Signed)
[facility], [place] 09/16/21  Subjective:   LOS: 0 Patient known to our practice from previous admissions.  Currently dialyzes at [facility] and is followed by [facility].  Missed his treatment on Thursday because he woke up late.  Reports eating a large plate of watermelon, pineapple.  Did not call to reschedule his treatment.  Girlfriend at bedside.  Currently short of breath, [place] O2.  Being escalated to facemask.  BMP shows potassium greater than 7.5.  EKG with peaked T waves.  Patient restless.  He has right IJ PermCath. Nephrology consult has been requested for emergent hemodialysis.  Objective:  Vital signs in last 24 hours:  Temp:  [98.3 F (36.8 C)] 98.3 F (36.8 C) (05/26 1648) Pulse Rate:  [68-73] 73 (05/26 2030) Resp:  [17-18] 17 (05/26 2030) BP: (128-146)/(97-111) 128/97 (05/26 2030) SpO2:  [95 %-99 %] 99 % (05/26 2030)  Weight change:  There were no vitals filed for this visit.  Intake/Output:   No intake or output data in the 24 hours ending 09/16/21 2051  Physical Exam: General:  No acute distress, laying in the bed  HEENT  anicteric, moist oral mucous membrane  Pulm/lungs Face mask, coarse breath sounds bilaterally  CVS/Heart  regular rhythm, no rub or gallop  Abdomen:   Soft, nontender  Extremities:  No peripheral edema  Neurologic:  Alert, oriented, able to follow commands  Skin:  No acute rashes  Right IJ PermCath  Physical  Basic Metabolic Panel:  Recent Labs  Lab 09/16/21 1923  NA 141  K >7.5*  CL 104  CO2 21*  GLUCOSE 94  BUN 79*  CREATININE 17.39*  CALCIUM 6.7*     CBC: Recent Labs  Lab 09/16/21 1923  WBC 5.6  NEUTROABS 3.7  HGB 10.9*  HCT 35.9*  MCV 89.8  PLT 81*      Lab Results  Component Value Date   HEPBSAG NON REACTIVE 01/26/2020   HEPBSAB Reactive (A) 08/09/2019   HEPBIGM NON REACTIVE 01/26/2020      Microbiology:  No results found for this or any previous visit (from the past 240  hour(s)).  Coagulation Studies: No results for input(s): LABPROT, INR in the last 72 hours.  Urinalysis: No results for input(s): COLORURINE, LABSPEC, PHURINE, GLUCOSEU, HGBUR, BILIRUBINUR, KETONESUR, PROTEINUR, UROBILINOGEN, NITRITE, LEUKOCYTESUR in the last 72 hours.  Invalid input(s): APPERANCEUR    Imaging: DG Lumbar Spine Complete  Result Date: 09/16/2021 CLINICAL DATA:  Acute on chronic lower back pain EXAM: LUMBAR SPINE - COMPLETE 4+ VIEW; SACRUM AND COCCYX - 2+ VIEW COMPARISON:  08/09/2014 FINDINGS: Lumbar spine: Frontal, bilateral oblique, lateral views of the lumbar spine are obtained. There are 5 non-rib-bearing lumbar type vertebral bodies in grossly normal anatomic alignment. There is increased sclerosis and subtle endplate irregularity involving the inferior endplate of L5 and superior endplate of S1. Mild progressive disc space narrowing at this level. While this could reflect underlying progressive spondylosis, other etiologies including dialysis related spondyloarthropathy or discitis/osteomyelitis could be considered. Remaining disc spaces are well preserved. No acute fractures. Sacrum/coccyx: Pelvic inlet, pelvic outlet, and lateral views are obtained. Please see above discussion for endplate irregularities at the L5-S1 disc space. The remainder of the sacrum and coccyx are unremarkable. Visualized portions of the bony pelvis are normal. Sacroiliac joints are symmetrical with no erosive change. IMPRESSION: 1. Increasing sclerosis and subtle endplate irregularity surrounding the L5-S1 disc space as above. This likely reflects progressive spondylosis, though dialysis related spondyloarthropathy or discitis/osteomyelitis  could be considered depending on clinical presentation. If further imaging is required, MRI of the lumbar spine may be useful. 2. No acute fracture. Electronically Signed   By: Randa Ngo M.D.   On: 09/16/2021 17:33   DG Sacrum/Coccyx  Result Date:  09/16/2021 CLINICAL DATA:  Acute on chronic lower back pain EXAM: LUMBAR SPINE - COMPLETE 4+ VIEW; SACRUM AND COCCYX - 2+ VIEW COMPARISON:  08/09/2014 FINDINGS: Lumbar spine: Frontal, bilateral oblique, lateral views of the lumbar spine are obtained. There are 5 non-rib-bearing lumbar type vertebral bodies in grossly normal anatomic alignment. There is increased sclerosis and subtle endplate irregularity involving the inferior endplate of L5 and superior endplate of S1. Mild progressive disc space narrowing at this level. While this could reflect underlying progressive spondylosis, other etiologies including dialysis related spondyloarthropathy or discitis/osteomyelitis could be considered. Remaining disc spaces are well preserved. No acute fractures. Sacrum/coccyx: Pelvic inlet, pelvic outlet, and lateral views are obtained. Please see above discussion for endplate irregularities at the L5-S1 disc space. The remainder of the sacrum and coccyx are unremarkable. Visualized portions of the bony pelvis are normal. Sacroiliac joints are symmetrical with no erosive change. IMPRESSION: 1. Increasing sclerosis and subtle endplate irregularity surrounding the L5-S1 disc space as above. This likely reflects progressive spondylosis, though dialysis related spondyloarthropathy or discitis/osteomyelitis could be considered depending on clinical presentation. If further imaging is required, MRI of the lumbar spine may be useful. 2. No acute fracture. Electronically Signed   By: Randa Ngo M.D.   On: 09/16/2021 17:33   DG Chest Portable 1 View  Result Date: 09/16/2021 CLINICAL DATA:  Lower back pain, shortness of breath EXAM: PORTABLE CHEST 1 VIEW COMPARISON:  07/12/2021 FINDINGS: Single frontal view of the chest demonstrates right internal jugular central venous catheter tip overlying the superior vena cava. Cardiac silhouette is stable. Stable chronic central vascular congestion without airspace disease, effusion, or  pneumothorax. No acute bony abnormality. IMPRESSION: 1. Chronic central vascular congestion without overt edema. Electronically Signed   By: Randa Ngo M.D.   On: 09/16/2021 19:45     Medications:     [START ON 09/17/2021] Chlorhexidine Gluconate Cloth  6 each Topical Q0600   insulin aspart  10 Units Intravenous Once   And   dextrose  1 ampule Intravenous Once   sodium zirconium cyclosilicate  10 g Oral Once     Assessment/ Plan:  46 y.o. male with end-stage renal disease, chronic dialysis dependent, history of infected AV graft, history of hyperkalemia, GERD, chronic pain, history of bilateral foot drop from remote Guillain[place], history of COVID-19 infection in the past, hypertension, congestive heart failure, depression was admitted on 09/16/2021 for  Active Problems:   * No active hospital problems. *  Back Pain  #.  Severe hyperkalemia in setting of ESRD Severe hyperkalemia due to missing dialysis on Thursday and dietary noncompliance.  Ate a big portion of watermelon and pineapple per patient report. -Emergent hemodialysis indicated.  Orders have been placed.  Nursing staff has been notified. -1K bath initially.  Monitor K intradialytically. -Probably will need hemodialysis tomorrow depending on volume status and potassium levels. - agree with shifting measures and lokelma  #Shortness of breath and volume overload -Requiring oxygen supplementation -Volume removal with dialysis as tolerated -Reassess tomorrow.  #. Anemia of CKD  Lab Results  Component Value Date   HGB 10.9 (L) 09/16/2021   Low dose EPO with HD if hemoglobin  #. Secondary hyperparathyroidism of renal origin N 25.81  Component Value Date/Time   PTH 35 01/25/2020 0944   Lab Results  Component Value Date   PHOS 4.4 05/19/2020   Monitor calcium and phos level during this admission  #Hypocalcemia Received IV calcium gluconate as a part of treatment for hyperkalemia. -We will start oral  calcitriol -We will also start oral calcium+ vitamin D supplements.      LOS: [place] 5/26/20238:51 PM  [facility] [place], [place]

## 2021-09-16 NOTE — H&P (Signed)
[place]   PATIENT NAME: Patrick Downs    MR#:  [id number]  DATE OF BIRTH:  [birth year]  DATE OF ADMISSION:  09/16/2021  PRIMARY CARE PHYSICIAN: Leonel Ramsay, MD   Patient is coming from: Home  REQUESTING/REFERRING PHYSICIAN: Ane Payment, MD  CHIEF COMPLAINT:   Chief Complaint  Patient presents with   Back Pain    HISTORY OF PRESENT ILLNESS:  Patrick Downs is a 46 y.o. African-American male with medical history significant for end-stage renal disease on hemodialysis on TTS, CHF, depression, GERD, gout, hypertension and peripheral neuropathy, who presented to the emergency room with acute onset of acute on chronic low back pain.  He has been having generalized weakness in his upper and lower extremities with acute midline back pain and sacral pain. He admitted to having a mechanical fall on Thursday.  He missed dialysis on Thursday and his last session was on Tuesday.  He has dyspnea with associated chest tightness with palpitations.  No cough or wheezing.  No fever or chills.  No nausea or vomiting or diarrhea or abdominal pain.  ED Course: Upon presentation to the emergency room, BP was 146/111 with otherwise normal vital signs.  Labs revealed hyperkalemia more than 7.5 antibiotics 79 with a creatinine of 17.39 with calcium 6.7 anion gap 16.  CBC showed hemoglobin of 10.9 and hematocrit 35.9 with platelets of 81 compared to 160 on 05/20/2020. EKG as reviewed by me : EKG showed normal sinus rhythm with a rate of 76 with first-degree AV block with T wave inversion laterally and prolonged QT interval with QTc of 508 MS. Imaging: Portable chest x-ray showed chronic central vascular congestion without overt edema.  The patient was given D50 insulin, an amp of calcium gluconate, nebulized albuterol and 10 g of Lokelma for his hyperkalemia.  Dr. Candiss Norse was notified about the patient and will dialyze the patient tonight.  The patient will be admitted to a PCU bed for  further evaluation and management. PAST MEDICAL HISTORY:   Past Medical History:  Diagnosis Date   CHF (congestive heart failure) (HCC)    Degenerative disc disease, lumbar    Depression    Dialysis patient ([place])    ESRD (end stage renal disease) on dialysis ([place])    Failure to thrive in adult    GERD (gastroesophageal reflux disease)    Gout    Guillain Barr syndrome (HCC)    Guillain Barr syndrome ([place])    Heart murmur    Hip pain, chronic, left 12/24/2020   HTN (hypertension)    Hyperparathyroidism (HCC)    Kidney failure    MRSA (methicillin resistant staph aureus) culture positive    Peripheral neuropathy    Pneumonia    Renal insufficiency    Respiratory failure ([place])     PAST SURGICAL HISTORY:   Past Surgical History:  Procedure Laterality Date   A/V FISTULAGRAM Right 01/07/2018   Procedure: A/V FISTULAGRAM;  Surgeon: Algernon Huxley, MD;  Location: [place] [facility];  Service: Cardiovascular;  Laterality: Right;   AMPUTATION TOE Right 06/08/2017   Procedure: AMPUTATION TOE RIGHT FIFTH TOE;  Surgeon: Samara Deist, DPM;  Location: [facility];  Service: Podiatry;  Laterality: Right;   AMPUTATION TOE Left 05/17/2018   Procedure: RAY LEFT 5TH;  Surgeon: Samara Deist, DPM;  Location: [facility];  Service: Podiatry;  Laterality: Left;   AMPUTATION TOE Right 03/05/2020   Procedure: AMPUTATION TOE IPJ X 2 RIGHT GREAT AND 3RD;  Surgeon: Samara Deist, DPM;  Location: [facility];  Service: Podiatry;  Laterality: Right;   AV FISTULA PLACEMENT     x5      2 graphs   AV FISTULA PLACEMENT Right 04/08/2020   Procedure: INSERTION OF ARTERIOVENOUS (AV) GORE-TEX GRAFT ARM ( ARTEGRAFT);  Surgeon: Algernon Huxley, MD;  Location: [facility];  Service: Vascular;  Laterality: Right;   DIALYSIS/PERMA CATHETER INSERTION N/A 01/20/2019   Procedure: DIALYSIS/PERMA CATHETER INSERTION;  Surgeon: Algernon Huxley, MD;  Location: [place] [facility];  Service: Cardiovascular;  Laterality: N/A;    DIALYSIS/PERMA CATHETER REMOVAL N/A 06/17/2018   Procedure: DIALYSIS/PERMA CATHETER REMOVAL;  Surgeon: Algernon Huxley, MD;  Location: [place] [facility];  Service: Cardiovascular;  Laterality: N/A;   PARATHYROIDECTOMY     PERIPHERAL VASCULAR THROMBECTOMY Right 01/22/2019   Procedure: PERIPHERAL VASCULAR THROMBECTOMY;  Surgeon: Algernon Huxley, MD;  Location: [place] [facility];  Service: Cardiovascular;  Laterality: Right;   REMOVAL OF GRAFT Right 05/16/2020   Procedure: REMOVAL OF GRAFT;  Surgeon: Conrad Deaf Smith, MD;  Location: [facility];  Service: Vascular;  Laterality: Right;   RENAL BIOPSY     REVISON OF ARTERIOVENOUS FISTULA Right 02/07/2018   Procedure: REVISON OF ARTERIOVENOUS FISTULA;  Surgeon: Algernon Huxley, MD;  Location: [facility];  Service: Vascular;  Laterality: Right;   TEE WITHOUT CARDIOVERSION N/A 01/22/2018   Procedure: TRANSESOPHAGEAL ECHOCARDIOGRAM (TEE);  Surgeon: Dionisio David, MD;  Location: [facility];  Service: Cardiovascular;  Laterality: N/A;   tonsiilectomy     TONSILLECTOMY     tracheotomy     UPPER EXTREMITY VENOGRAPHY Bilateral 02/23/2020   Procedure: UPPER EXTREMITY VENOGRAPHY;  Surgeon: Algernon Huxley, MD;  Location: [place] [facility];  Service: Cardiovascular;  Laterality: Bilateral;    SOCIAL HISTORY:   Social History   Tobacco Use   Smoking status: Never   Smokeless tobacco: Never  Substance Use Topics   Alcohol use: No    FAMILY HISTORY:   Family History  Problem Relation Age of Onset   Diabetes Mellitus II Father    Kidney disease Father    Kidney failure Paternal Grandfather    Prostate cancer Neg Hx    Kidney cancer Neg Hx    Bladder Cancer Neg Hx     DRUG ALLERGIES:   Allergies  Allergen Reactions   Hepatitis B Vaccine     Other reaction(s): Unknown   Ondansetron Other (See Comments)    Stomach pain    Minoxidil Other (See Comments)    "put fluid around my heart", PERICARDIAL EFFUSION   Morphine And Related Other (See Comments)     Aggressive    Omnipaque [Iohexol] Itching and Other (See Comments)    Rigors on one occasion, widespread itching on a separate occasion (resolved with Benadryl), tremors    REVIEW OF SYSTEMS:   Review of Systems  Neurological:  Tremors: .MYCHESTPAIN.  As per history of present illness. All pertinent systems were reviewed above. Constitutional, HEENT, cardiovascular, respiratory, GI, GU, musculoskeletal, neuro, psychiatric, endocrine, integumentary and hematologic systems were reviewed and are otherwise negative/unremarkable except for positive findings mentioned above in the HPI.   MEDICATIONS AT HOME:   Prior to Admission medications   Medication Sig Start Date End Date Taking? Authorizing Provider  acetaminophen (TYLENOL) 500 MG tablet Take 1,000-1,500 mg by mouth 2 (two) times daily as needed for moderate pain or headache.    [provider]  AURYXIA 1 GM 210 MG(Fe) tablet  Take 420 mg by mouth 3 (three) times daily. Patient not taking: Reported on 06/27/2021 11/28/19   [provider]  brompheniramine-pseudoephedrine-DM 30-2-10 MG/5ML syrup Take 5 mLs by mouth 4 (four) times daily as needed. 07/12/21   Johnn Hai, PA-C  cloNIDine (CATAPRES - DOSED IN MG/24 HR) 0.1 mg/24hr patch Place 0.1 mg onto the skin once a week. 09/14/21   [provider]  diphenhydrAMINE (BENADRYL) 25 mg capsule Take 2 tablets by mouth 1 hour before procedure 07/20/20   [provider]  gabapentin (NEURONTIN) 300 MG capsule Take 600 mg by mouth at bedtime.     [provider]  hydrOXYzine (ATARAX) 25 MG tablet Take 1 tablet by mouth 2 (two) times daily. 12/02/20   [provider]  midodrine (PROAMATINE) 5 MG tablet Take 5-10 mg by mouth See admin instructions. Take 10 mg by mouth before dialysis on Tuesdays, Thursdays, & Saturday and take 5 mg in the evening on  Tuesdays, Thursdays, & Saturdays. On Monday, Wednesday, Friday and Sunday 5 mg by mouth every 8  hours. Patient not taking: Reported on 06/27/2021    [provider]  oxyCODONE-acetaminophen (PERCOCET) 10-325 MG tablet Take 1 tablet by mouth every 4 (four) hours as needed for pain. 09/02/21 10/02/21  Molli Barrows, MD  oxyCODONE-acetaminophen (PERCOCET) 10-325 MG tablet Take 1 tablet by mouth every 4 (four) hours as needed for pain. 09/02/21 10/02/21  Molli Barrows, MD  oxyCODONE-acetaminophen (PERCOCET) 10-325 MG tablet Take 1 tablet by mouth every 4 (four) hours as needed for pain. 10/01/21 10/31/21  Molli Barrows, MD  sildenafil (VIAGRA) 100 MG tablet Take 100 mg by mouth daily as needed for erectile dysfunction. 08/20/21   [provider]      VITAL SIGNS:  Blood pressure (!) 131/116, pulse 73, temperature (!) 97.4 F (36.3 C), temperature source Oral, resp. rate 14, SpO2 100 %.  PHYSICAL EXAMINATION:  Physical Exam  GENERAL:  46 y.o.-year-old African-American male  patient lying in the bed with mild respiratory distress with conversational dyspnea. EYES: Pupils equal, round, reactive to light and accommodation. No scleral icterus. Extraocular muscles intact.  HEENT: Head atraumatic, normocephalic. Oropharynx and nasopharynx clear.  NECK:  Supple, no jugular venous distention. No thyroid enlargement, no tenderness.  LUNGS: Diminished bibasilar breath sounds with mild bibasilar Rales.  No use of accessory muscles of respiration.  CARDIOVASCULAR: Regular rate and rhythm, S1, S2 normal. No murmurs, rubs, or gallops.  ABDOMEN: Soft, nondistended, nontender. Bowel sounds present. No organomegaly or mass.  EXTREMITIES: No pedal edema, cyanosis, or clubbing.  NEUROLOGIC: Cranial nerves II through XII are intact. Muscle strength 5/5 in all extremities. Sensation intact. Gait not checked.  PSYCHIATRIC: The patient is alert and oriented x 3.  Normal affect and good eye contact. SKIN: No obvious rash, lesion, or ulcer.   LABORATORY PANEL:   CBC Recent Labs  Lab  09/16/21 1923  WBC 5.6  HGB 10.9*  HCT 35.9*  PLT 81*   ------------------------------------------------------------------------------------------------------------------  Chemistries  Recent Labs  Lab 09/16/21 1923  NA 141  K >7.5*  CL 104  CO2 21*  GLUCOSE 94  BUN 79*  CREATININE 17.39*  CALCIUM 6.7*  AST 37  ALT 41  ALKPHOS 67  BILITOT 1.0   ------------------------------------------------------------------------------------------------------------------  Cardiac Enzymes No results for input(s): TROPONINI in the last 168 hours. ------------------------------------------------------------------------------------------------------------------  RADIOLOGY:  DG Lumbar Spine Complete  Result Date: 09/16/2021 CLINICAL DATA:  Acute on chronic lower back pain EXAM: LUMBAR SPINE -  COMPLETE 4+ VIEW; SACRUM AND COCCYX - 2+ VIEW COMPARISON:  08/09/2014 FINDINGS: Lumbar spine: Frontal, bilateral oblique, lateral views of the lumbar spine are obtained. There are 5 non-rib-bearing lumbar type vertebral bodies in grossly normal anatomic alignment. There is increased sclerosis and subtle endplate irregularity involving the inferior endplate of L5 and superior endplate of S1. Mild progressive disc space narrowing at this level. While this could reflect underlying progressive spondylosis, other etiologies including dialysis related spondyloarthropathy or discitis/osteomyelitis could be considered. Remaining disc spaces are well preserved. No acute fractures. Sacrum/coccyx: Pelvic inlet, pelvic outlet, and lateral views are obtained. Please see above discussion for endplate irregularities at the L5-S1 disc space. The remainder of the sacrum and coccyx are unremarkable. Visualized portions of the bony pelvis are normal. Sacroiliac joints are symmetrical with no erosive change. IMPRESSION: 1. Increasing sclerosis and subtle endplate irregularity surrounding the L5-S1 disc space as above. This likely  reflects progressive spondylosis, though dialysis related spondyloarthropathy or discitis/osteomyelitis could be considered depending on clinical presentation. If further imaging is required, MRI of the lumbar spine may be useful. 2. No acute fracture. Electronically Signed   By: Randa Ngo M.D.   On: 09/16/2021 17:33   DG Sacrum/Coccyx  Result Date: 09/16/2021 CLINICAL DATA:  Acute on chronic lower back pain EXAM: LUMBAR SPINE - COMPLETE 4+ VIEW; SACRUM AND COCCYX - 2+ VIEW COMPARISON:  08/09/2014 FINDINGS: Lumbar spine: Frontal, bilateral oblique, lateral views of the lumbar spine are obtained. There are 5 non-rib-bearing lumbar type vertebral bodies in grossly normal anatomic alignment. There is increased sclerosis and subtle endplate irregularity involving the inferior endplate of L5 and superior endplate of S1. Mild progressive disc space narrowing at this level. While this could reflect underlying progressive spondylosis, other etiologies including dialysis related spondyloarthropathy or discitis/osteomyelitis could be considered. Remaining disc spaces are well preserved. No acute fractures. Sacrum/coccyx: Pelvic inlet, pelvic outlet, and lateral views are obtained. Please see above discussion for endplate irregularities at the L5-S1 disc space. The remainder of the sacrum and coccyx are unremarkable. Visualized portions of the bony pelvis are normal. Sacroiliac joints are symmetrical with no erosive change. IMPRESSION: 1. Increasing sclerosis and subtle endplate irregularity surrounding the L5-S1 disc space as above. This likely reflects progressive spondylosis, though dialysis related spondyloarthropathy or discitis/osteomyelitis could be considered depending on clinical presentation. If further imaging is required, MRI of the lumbar spine may be useful. 2. No acute fracture. Electronically Signed   By: Randa Ngo M.D.   On: 09/16/2021 17:33   DG Chest Portable 1 View  Result Date:  09/16/2021 CLINICAL DATA:  Lower back pain, shortness of breath EXAM: PORTABLE CHEST 1 VIEW COMPARISON:  07/12/2021 FINDINGS: Single frontal view of the chest demonstrates right internal jugular central venous catheter tip overlying the superior vena cava. Cardiac silhouette is stable. Stable chronic central vascular congestion without airspace disease, effusion, or pneumothorax. No acute bony abnormality. IMPRESSION: 1. Chronic central vascular congestion without overt edema. Electronically Signed   By: Randa Ngo M.D.   On: 09/16/2021 19:45      IMPRESSION AND PLAN:  Assessment and Plan: * Fluid overload - This is clearly secondary to missing hemodialysis on Tuesday. - The patient admitted to a progressive unit bed. - Dr. Candiss Norse was notified and is aware about the patient. - The patient is getting hemodialysis this evening.  Hyperkalemia - The patient was aggressively managed for hyperkalemia in the ER. - He is getting hemodialysis tonight. - We will follow potassium level.  Acute midline low back pain without sciatica - Pain management to be provided. - Physical therapy will be obtained for subjective upper and lower extremity weakness.  Type 2 diabetes mellitus with peripheral neuropathy (HCC) - The patient be placed on supplement coverage with NovoLog. - We will continue Neurontin.  Essential hypertension - I will continue Catapres.  ESRD on dialysis [facility]) - The patient is gets hemodialysis on Tuesday Thursday and Saturday. - Is getting hemodialysis tonight to replace his last session yesterday and associated significant hyperkalemia. - We will resume calcitriol and Os-Cal.    DVT prophylaxis: Subcutaneous heparin. Advanced Care Planning:  Code Status: full code.  Family Communication:  The plan of care was discussed in details with the patient (and family). I answered all questions. The patient agreed to proceed with the above mentioned plan. Further management will  depend upon hospital course. Disposition Plan: Back to previous home environment Consults called: Nephrology.  All the records are reviewed and case discussed with ED provider.  Status is: Inpatient  At the time of the admission, it appears that the appropriate admission status for this patient is inpatient.  This is judged to be reasonable and necessary in order to provide the required intensity of service to ensure the patient's safety given the presenting symptoms, physical exam findings and initial radiographic and laboratory data in the context of comorbid conditions.  The patient requires inpatient status due to high intensity of service, high risk of further deterioration and high frequency of surveillance required.  I certify that at the time of admission, it is my clinical judgment that the patient will require inpatient hospital care extending more than 2 midnights.                            Dispo: The patient is from: Home              Anticipated d/c is to: Home              Patient currently is not medically stable to d/c.              Difficult to place patient: No  Christel Mormon M.D on 09/16/2021 at 11:20 PM  [facility]   From 7 PM-7 AM, contact night-coverage [email or web address]  CC: Primary care physician; Leonel Ramsay, MD

## 2021-09-16 NOTE — ED Provider Triage Note (Signed)
Emergency Medicine Provider Triage Evaluation Note  Patrick Downs , a 46 y.o. male  was evaluated in triage.  Pt complains of acute on chronic low back pain.  Patient with a history of ESRD on dialysis ([facility]) presents to the ED with complaints of generalized weakness to his arms and legs as well as some acute midline low back pain and tailbone pain.  Patient does endorse a mechanical fall on Thursday in his home.  He denies any head injury but reports slipping off of his rollator, added on his buttocks and back.  He gives a remote history of being diagnosed with Guillain-Barr syndrome back in 2000.  Patient reports some resolution of his symptoms, but uses AFOs bilaterally as well as a wheeled walker to ambulate.  Review of Systems  Positive: Back pain, tailbone pain, MSK weakness Negative: FCS  Physical Exam  BP (!) 146/111 (BP Location: Right Arm)   Pulse 68   Temp 98.3 F (36.8 C) (Oral)   Resp 18   SpO2 95%  Gen:   Awake, no distress  NAD Resp:  Normal effort CTA MSK:   Moves extremities without difficulty AFOs bilaterally. Hand contractures noted Other:    Medical Decision Making  Medically screening exam initiated at 4:56 PM.  Appropriate orders placed.  Patrick Downs was informed that the remainder of the evaluation will be completed by another provider, this initial triage assessment does not replace that evaluation, and the importance of remaining in the ED until their evaluation is complete.  Patient with history of Guillain-Barr, presents to the ED with acute on chronic midline low back pain after mechanical fall.  Patient also reports generalized muscle weakness to his arms and legs.   Melvenia Needles, PA-C 09/16/21 1658

## 2021-09-16 NOTE — Assessment & Plan Note (Signed)
-   Pain management to be provided. - Physical therapy will be obtained for subjective upper and lower extremity weakness.

## 2021-09-16 NOTE — Assessment & Plan Note (Signed)
-   I will continue Catapres.

## 2021-09-16 NOTE — ED Notes (Signed)
PT put on 2 L [place] for comfort

## 2021-09-16 NOTE — ED Notes (Signed)
See triage note. Pt states he has trouble getting enough breath to talk- is speaking gin broken sentences. Respirations are even, labored and dyspneic with exertion. Pt has jerking motions in upper extremities and c/o weakness. Pt is a hard stick- MD notified.

## 2021-09-16 NOTE — ED Notes (Signed)
Obtained one set of blood culures, gray on ice, and light green tubes for processing via butterfly stick to medial left antecubital space.

## 2021-09-16 NOTE — ED Notes (Signed)
Pt called out in the lobby reporting that he feels SOB, sats 94% on RA, pt placed on 2L [place] for comfort.

## 2021-09-16 NOTE — Assessment & Plan Note (Signed)
-   The patient be placed on supplement coverage with NovoLog. - We will continue Neurontin.

## 2021-09-16 NOTE — ED Provider Notes (Signed)
-----------------------------------------   8:38 PM on 09/16/2021 -----------------------------------------   I received signout from [place] and took over care on this patient.  He was moved from the flex area to the main ED.  On my exam, the patient is somnolent but arousable.  He demonstrates increased work of breathing but no acute respiratory distress.  O2 saturation is 99% on 4 L by nasal cannula.  Lab work-up reveals significant hyperkalemia.  Chest x-ray shows vascular congestion without edema.  I have ordered calcium gluconate, insulin and D50, albuterol, and Lokelma to treat hyperkalemia.  Dr. Candiss Norse from nephrology was consulted by APP Sherral Hammers and is at the bedside evaluating the patient for urgent dialysis.  I then consulted APP Dewaine Conger from the ICU to determine appropriate disposition for this patient, who also come to evaluate the patient.  The patient has spinal x-ray findings concerning for possible osteomyelitis and will need an MRI, however he would not be able to tolerate the MRI at this point.  This can be performed once the hyperkalemia is treated and he is more clinically stable.   ED ECG REPORT I, Arta Silence, the attending physician, personally viewed and interpreted this ECG.  Date: 09/16/2021 EKG Time: 2015 Rate: 76 Rhythm: normal sinus rhythm QRS Axis: normal Intervals: Prolonged QT, normal QRS ST/T Wave abnormalities: Slightly peaked T waves Narrative Interpretation: Peak T waves consistent with hyperkalemia   ----------------------------------------- 10:15 PM on 09/16/2021 -----------------------------------------   The patient is down to 2 L by nasal cannula after the albuterol and appears more comfortable.  He was evaluated by the ICU APP [place] who advises that the patient can be reassessed after dialysis but likely will be stable for the floor.  Dr. Candiss Norse confirms that the patient will be taken for emergent dialysis tonight.  I consulted  Dr. Sidney Ace from the hospitalist service; based on our discussion he agrees to admit the patient.  I advised him about the x-ray findings and the need for MRI once the patient is stabilized.  .Critical Care Performed by: Arta Silence, MD Authorized by: Arta Silence, MD   Critical care provider statement:    Critical care time (minutes):  30   Critical care was necessary to treat or prevent imminent or life-threatening deterioration of the following conditions:  Cardiac failure and respiratory failure   Critical care was time spent personally by me on the following activities:  Development of treatment plan with patient or surrogate, discussions with consultants, evaluation of patient's response to treatment, examination of patient, ordering and review of laboratory studies, ordering and review of radiographic studies, ordering and performing treatments and interventions, pulse oximetry, re-evaluation of patient's condition, review of old charts and obtaining history from patient or surrogate   Care discussed with: admitting provider       Arta Silence, MD 09/16/21 2236

## 2021-09-16 NOTE — ED Triage Notes (Signed)
Pt presents to ED with c/o of lower back pain, HX of same. Pt states he has DGD. Pt is a dialysis pt who missed treatment this past Thursday due to oversleeping. Pt is A&OX4.

## 2021-09-16 NOTE — Assessment & Plan Note (Signed)
-   The patient was aggressively managed for hyperkalemia in the ER. - He is getting hemodialysis tonight. - We will follow potassium level.

## 2021-09-16 NOTE — ED Provider Notes (Signed)
[facility] Provider Note  Patient Contact: 8:01 PM (approximate)   History   Back Pain   HPI  Patrick Downs is a 46 y.o. male presents to the emergency department with acute on chronic low back pain.  Patient endorses generalized weakness in the upper and lower extremities and acute midline low back pain and tailbone pain.  Patient states that he did have a mechanical fall in his home on Thursday.  He has a remote history of Guillain-Barr syndrome.  No current numbness or tingling in the lower extremities.  Patient missed dialysis on Thursday and is having shortness of breath and chest tightness and states that he feels hot.      Physical Exam   Triage Vital Signs: ED Triage Vitals [09/16/21 1648]  Enc Vitals Group     BP (!) 146/111     Pulse Rate 68     Resp 18     Temp 98.3 F (36.8 C)     Temp Source Oral     SpO2 95 %     Weight      Height      Head Circumference      Peak Flow      Pain Score 10     Pain Loc      Pain Edu?      Excl. in [place]?     Most recent vital signs: Vitals:   09/16/21 2200 09/16/21 2215  BP: (!) 139/102 (!) 152/88  Pulse: 75 65  Resp: 11 11  Temp:    SpO2: 100% 100%     General: Alert and in no acute distress. Eyes:  PERRL. EOMI. Head: No acute traumatic findings ENT:      Nose: No congestion/rhinnorhea.      Mouth/Throat: Mucous membranes are moist. Neck: No stridor. No cervical spine tenderness to palpation. Hematological/Lymphatic/Immunilogical: No cervical lymphadenopathy. Cardiovascular:  Good peripheral perfusion Respiratory: Patient tachypneic with crackles auscultated bilaterally.  Good air entry to the bases with no decreased or absent breath sounds. Gastrointestinal: Bowel sounds 4 quadrants. Soft and nontender to palpation. No guarding or rigidity. No palpable masses. No distention. No CVA tenderness. Musculoskeletal: Full range of motion to all extremities.  Neurologic:  No gross focal  neurologic deficits are appreciated.  Skin:   No rash noted Other:   ED Results / Procedures / Treatments   Labs (all labs ordered are listed, but only abnormal results are displayed) Labs Reviewed  CBC WITH DIFFERENTIAL/PLATELET - Abnormal; Notable for the following components:      Result Value   RBC 4.00 (*)    Hemoglobin 10.9 (*)    HCT 35.9 (*)    RDW 19.7 (*)    Platelets 81 (*)    All other components within normal limits  COMPREHENSIVE METABOLIC PANEL - Abnormal; Notable for the following components:   Potassium >7.5 (*)    CO2 21 (*)    BUN 79 (*)    Creatinine, Ser 17.39 (*)    Calcium 6.7 (*)    GFR, Estimated 3 (*)    Anion gap 16 (*)    All other components within normal limits  CBG MONITORING, ED - Abnormal; Notable for the following components:   Glucose-Capillary 152 (*)    All other components within normal limits  CULTURE, BLOOD (ROUTINE X 2)  CULTURE, BLOOD (ROUTINE X 2) W REFLEX TO ID PANEL  LIPASE, BLOOD  PROCALCITONIN  PROCALCITONIN  HEPATITIS B SURFACE ANTIGEN  HEPATITIS B SURFACE ANTIBODY,QUALITATIVE  HIV ANTIBODY (ROUTINE TESTING W REFLEX)  BASIC METABOLIC PANEL  CBC  PHOSPHORUS  HEPATITIS B SURFACE ANTIBODY, QUANTITATIVE     EKG  Normal sinus rhythm with prolonged QT and slightly peaked T waves.   RADIOLOGY    PROCEDURES:  Critical Care performed: No  Procedures   MEDICATIONS ORDERED IN ED: Medications  Chlorhexidine Gluconate Cloth 2 % PADS 6 each (has no administration in time range)  heparin injection 20 Units/kg (has no administration in time range)  heparin injection 20 Units/kg (has no administration in time range)  calcitRIOL (ROCALTROL) capsule 0.25 mcg (0.25 mcg Oral Given 09/16/21 2227)  calcium carbonate (OS-CAL - dosed in mg of elemental calcium) tablet 1,250 mg (has no administration in time range)  heparin sodium (porcine) 1000 UNIT/ML injection (has no administration in time range)  hydrOXYzine (ATARAX)  tablet 25 mg (has no administration in time range)  gabapentin (NEURONTIN) capsule 600 mg (has no administration in time range)  heparin injection 5,000 Units (has no administration in time range)  acetaminophen (TYLENOL) tablet 650 mg (has no administration in time range)    Or  acetaminophen (TYLENOL) suppository 650 mg (has no administration in time range)  traZODone (DESYREL) tablet 25 mg (has no administration in time range)  magnesium hydroxide (MILK OF MAGNESIA) suspension 30 mL (has no administration in time range)  furosemide (LASIX) injection 60 mg (has no administration in time range)  promethazine (PHENERGAN) 12.5 mg in sodium chloride 0.9 % 50 mL IVPB (has no administration in time range)  oxyCODONE-acetaminophen (PERCOCET/ROXICET) 5-325 MG per tablet 1 tablet (has no administration in time range)    And  oxyCODONE (Oxy IR/ROXICODONE) immediate release tablet 5 mg (has no administration in time range)  sodium zirconium cyclosilicate (LOKELMA) packet 10 g (10 g Oral Given 09/16/21 2105)  calcium gluconate inj 10% (1 g) URGENT USE ONLY! (1 g Intravenous Given 09/16/21 2049)  albuterol (PROVENTIL) (2.5 MG/3ML) 0.083% nebulizer solution 10 mg (10 mg Nebulization Given 09/16/21 2034)  insulin aspart (novoLOG) injection 10 Units (10 Units Intravenous Given 09/16/21 2112)    And  dextrose 50 % solution 50 mL (50 mLs Intravenous Given 09/16/21 2055)     IMPRESSION / MDM / ASSESSMENT AND PLAN / ED COURSE  I reviewed the triage vital signs and the nursing notes.                             Assessment and plan: Need for dialysis Low back pain 46 year old male presents to the emergency department with increased work of breathing and chest tightness.  Patient was hypertensive at triage but vital signs were otherwise reassuring.  O2 saturation is 99% on 4 L by nasal cannula.  Given missed dialysis treatment on Thursday, I reached out to nephrologist on-call, Dr. Candiss Norse.  Very much  appreciate time and consult.  Given hyperkalemia, Dr. Candiss Norse recommended emergent dialysis.  Patient had hyperkalemia greater than 7.5 and elevated creatinine at 17.39.  Procalcitonin in range.  X-ray of the lumbar spine suggested spondylosis versus osteomyelitis and MRI of the lumbar spine was recommended.  Patient was transition to main side of the emergency department for further care and management under the care of Dr. Cherylann Banas.        FINAL CLINICAL IMPRESSION(S) / ED DIAGNOSES   Final diagnoses:  Hyperkalemia  Acute bilateral low back pain without sciatica     Rx / DC Orders  ED Discharge Orders     None        Note:  This document was prepared using Dragon voice recognition software and may include unintentional dictation errors.   Vallarie Mare [place], Hershal Coria 09/16/21 2230    Harvest Dark, MD 09/17/21 1901

## 2021-09-16 NOTE — ED Notes (Signed)
Dialysis remains at the bedside.

## 2021-09-16 NOTE — ED Notes (Signed)
Pt writhing in pain and increased wob. Pt stating that these are new symptoms. Pt appears to be in extreme discomfort at this time. Pt placed on 2 L [place] for comfort

## 2021-09-16 NOTE — Assessment & Plan Note (Signed)
-   The patient is gets hemodialysis on Tuesday Thursday and Saturday. - Is getting hemodialysis tonight to replace his last session yesterday and associated significant hyperkalemia. - We will resume calcitriol and Os-Cal.

## 2021-09-17 ENCOUNTER — Inpatient Hospital Stay: Payer: Medicare Other

## 2021-09-17 DIAGNOSIS — M545 Low back pain, unspecified: Secondary | ICD-10-CM | POA: Insufficient documentation

## 2021-09-17 LAB — BASIC METABOLIC PANEL
Anion gap: 15 (ref 5–15)
BUN: 41 mg/dL — ABNORMAL HIGH (ref 6–20)
CO2: 29 mmol/L (ref 22–32)
Calcium: 7.4 mg/dL — ABNORMAL LOW (ref 8.9–10.3)
Chloride: 96 mmol/L — ABNORMAL LOW (ref 98–111)
Creatinine, Ser: 11.2 mg/dL — ABNORMAL HIGH (ref 0.61–1.24)
GFR, Estimated: 5 mL/min — ABNORMAL LOW (ref >60)
Glucose, Bld: 139 mg/dL — ABNORMAL HIGH (ref 70–99)
Potassium: 4.5 mmol/L (ref 3.5–5.1)
Sodium: 140 mmol/L (ref 135–145)

## 2021-09-17 LAB — HIV ANTIBODY (ROUTINE TESTING W REFLEX): HIV Screen 4th Generation wRfx: NONREACTIVE

## 2021-09-17 LAB — CBC
HCT: 31.7 % — ABNORMAL LOW (ref 39.0–52.0)
Hemoglobin: 9.7 g/dL — ABNORMAL LOW (ref 13.0–17.0)
MCH: 27.5 pg (ref 26.0–34.0)
MCHC: 30.6 g/dL (ref 30.0–36.0)
MCV: 89.8 fL (ref 80.0–100.0)
Platelets: 90 10*3/uL — ABNORMAL LOW (ref 150–400)
RBC: 3.53 MIL/uL — ABNORMAL LOW (ref 4.22–5.81)
RDW: 19.5 % — ABNORMAL HIGH (ref 11.5–15.5)
WBC: 4.4 10*3/uL (ref 4.0–10.5)
nRBC: 0 % (ref 0.0–0.2)

## 2021-09-17 LAB — HEPATITIS B SURFACE ANTIBODY,QUALITATIVE: Hep B S Ab: REACTIVE — AB

## 2021-09-17 LAB — POTASSIUM: Potassium: 4.9 mmol/L (ref 3.5–5.1)

## 2021-09-17 LAB — HEPATITIS B SURFACE ANTIGEN: Hepatitis B Surface Ag: NONREACTIVE

## 2021-09-17 LAB — PROCALCITONIN: Procalcitonin: 0.81 ng/mL

## 2021-09-17 MED ORDER — HEPARIN SODIUM (PORCINE) 1000 UNIT/ML IJ SOLN
INTRAMUSCULAR | Status: AC
  Filled 2021-09-17: qty 10

## 2021-09-17 NOTE — TOC Initial Note (Signed)
Transition of Care [facility]) - Initial/Assessment Note    Patient Details  Name: Patrick Downs MRN: [id number] Date of Birth: 1976/01/29  Transition of Care [facility]) CM/SW Contact:    Janyth Contes, [place] Phone Number: 09/17/2021, 11:09 AM  Clinical Narrative:                  [facility] consult for potential SNF placment. Per PT evaluation, patient does not need any further PT services or DME.   No TOC needs at this time.        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[place]) 05/16/2020   Cellulitis 05/14/2020   Ulcer of great toe, right, with fat layer exposed ([place]) 02/13/2020   COVID-19 01/28/2020   Acute hypoxemic respiratory failure due to COVID-19 ([place]) 01/25/2020   Sepsis ([place]) 01/25/2020   Thrombocytopenia ([place]) 01/25/2020   Elevated troponin 01/25/2020   Peripheral neuropathy 01/25/2020   Other disorders of phosphorus metabolism 11/27/2019   Sepsis without acute organ dysfunction ([place]) 08/10/2019   Hypercalcemia 05/15/2019   Murmur 02/19/2019   GERD (gastroesophageal reflux disease) 03/01/2018   Degenerative disc disease, lumbar 01/09/2018   HTN (hypertension) 12/28/2017   Hyperkalemia 08/16/2017   Cellulitis of right toe 04/25/2017   Erectile dysfunction due to diseases classified elsewhere 05/31/2016   Awaiting organ transplant status 08/23/2015   Polyneuropathy, unspecified 08/02/2015    Anemia in ESRD (end-stage renal disease) ([place]) 08/02/2015   Gastroenteritis 08/02/2015   Heart failure, unspecified ([place]) 08/02/2015   Secondary hyperparathyroidism of renal origin ([place]) 08/02/2015   Bacterial infection, unspecified 06/29/2015   Unspecified open wound of right upper arm, initial encounter 06/29/2015   Encounter for fitting of gastrointestinal device 06/04/2015   Other specified local infections of the skin and subcutaneous tissue 05/20/2015   Idiopathic chronic hypotension 12/11/2014   Rectal pain 11/27/2014   Other specified diseases of anus and rectum 11/27/2014   Unspecified protein-calorie malnutrition ([place]) 10/08/2014   Encounter for imaging study to confirm nasogastric (NG) tube placement    Encounter for nasogastric (NG) tube placement    Respiratory failure, acute ([place])    Guillain Barr syndrome, lower ext weakness 08/27/2014   CAP (community acquired pneumonia) 08/23/2014   ESRD on dialysis ([place]) 08/23/2014   A-V fistula ([place]) 08/23/2014   ARDS (adult respiratory distress syndrome) ([place]) 08/23/2014   Septic shock ([place]) 08/23/2014   Hypotension 08/23/2014   Acute respiratory failure ([place]) 08/23/2014   Fever    Type 2 diabetes  mellitus ([place]) 07/01/2014   Type 2 diabetes mellitus with peripheral neuropathy ([place]) 07/01/2014   Headache, unspecified 05/23/2014   Pruritus, unspecified 05/23/2014   Shortness of breath 05/23/2014   Iron deficiency anemia, unspecified 02/54/2706   Complication of vascular dialysis catheter 01/08/2014   Diarrhea, unspecified 01/08/2014   Pain, unspecified 01/08/2014   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, implants and grafts, sequela 01/08/2014   Anxiety disorder, unspecified 12/28/2013   Photophobia 12/11/2013   Spells 10/30/2013   Dizziness 10/30/2013   Other disorders of electrolyte and fluid balance, not elsewhere classified 09/16/2013   Other ulcerative colitis without complications ([place]) 23/76/2831   Coagulation  defect, unspecified ([place]) 04/21/2013   Other allergy, initial encounter 04/21/2013   Other mechanical complication of surgically created arteriovenous fistula, initial encounter ([place]) 02/19/2013   Foot drop 12/09/2012   History of Guillain-Barre syndrome 12/09/2012   Other specified disorders of parathyroid gland ([place]) 05/15/2012   Dependence on renal dialysis ([place]) 11/29/2010   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   PCP:  Leonel Ramsay, MD Pharmacy:   [facility] [place], [place] AT [facility] OF [address] [place] [address] Phone: [phone] Fax: [phone]     Social Determinants of Health (SDOH) Interventions    Readmission Risk Interventions    01/26/2020    2:18 PM  Readmission Risk Prevention Plan  Transportation Screening Complete  PCP or Specialist Appt within 3-5 Days Complete  HRI or Home Care Consult Patient refused  Social Work Consult for [place] Planning/Counseling Complete  Palliative Care Screening Not Applicable  Medication Review Press photographer) Complete

## 2021-09-17 NOTE — Progress Notes (Signed)
Pt admitted to unit from HD.  Pt noted to have small medication patch on left upper arm.  Pt stated the patch is a clonidine patch.  RN asked pt if the patch was applied in the ED and the pt said the patch was from home.  BP checked and WDL.

## 2021-09-17 NOTE — Evaluation (Signed)
Physical Therapy Evaluation Patient Details Name: Patrick Downs MRN: [id number] DOB: November 19, 1975 Today's Date: 09/17/2021  History of Present Illness  Pt is a 46 y/o M admitted on 09/16/21 after presenting with c/o acute on chronic low back pain & generalized weakness in BUE & BLE. Pt endorses a fall & dyspnea with associated chest tightness with palpitations. Pt is being treated for fluid overload & hyperkalemia. PMH: ESRD on HD TTS, CHF, depression, GERD, gout, HTN, peripheral neuropathy, Guillan Barre syndrome  Clinical Impression  Pt seen for PT evaluation with pt received asleep but eventually awakened & agreeable to tx. Pt reports prior to admission he was independent without AD except BLE AFOs, cooking, cleaning, & driving. On this date, pt is able to don AFOs & shoes independently sitting EOB & ambulate in hallway without AD with mod I. Pt reports he is feeling better & more like himself. At this time pt appears to be close to baseline level of function & does not have any acute PT needs. PT to sign off at this time.    Pt received on 3L/min via nasal cannula, weaned to room air & lowest SPO2 88% after gait but recovers well.     Recommendations for follow up therapy are one component of a multi-disciplinary discharge planning process, led by the attending physician.  Recommendations may be updated based on patient status, additional functional criteria and insurance authorization.  Follow Up Recommendations No PT follow up    Assistance Recommended at Discharge PRN  Patient can return home with the following  Assistance with cooking/housework    Equipment Recommendations None recommended by PT  Recommendations for Other Services       Functional Status Assessment Patient has had a recent decline in their functional status and demonstrates the ability to make significant improvements in function in a reasonable and predictable amount of time.     Precautions / Restrictions  Precautions Precautions: None Precaution Comments: pt wears personal custom BLE AFOs Restrictions Weight Bearing Restrictions: No      Mobility  Bed Mobility Overal bed mobility: Modified Independent             General bed mobility comments: supine>sit with HOB elevated    Transfers Overall transfer level: Independent Equipment used: None               General transfer comment: STS without AD    Ambulation/Gait Ambulation/Gait assistance: Modified independent (Device/Increase time) Gait Distance (Feet): 150 Feet Assistive device: None (personal BLE AFOs) Gait Pattern/deviations: Decreased dorsiflexion - right, Decreased dorsiflexion - left Gait velocity: Slightly decreased     General Gait Details: Pt with personal BLE AFOs with decreased dorsiflexion & some genu recurvatum BLE in stance phase  Stairs            Wheelchair Mobility    Modified Rankin (Stroke Patients Only)       Balance Overall balance assessment: Modified Independent   Sitting balance-Leahy Scale: Normal Sitting balance - Comments: dons AFOs & shoes sitting EOB without LOB   Standing balance support: During functional activity, No upper extremity supported Standing balance-Leahy Scale: Good                               Pertinent Vitals/Pain Pain Assessment Pain Assessment: Faces Faces Pain Scale: Hurts a little bit Pain Location: chronic back pain Pain Descriptors / Indicators: Discomfort Pain Intervention(s): Monitored during session  Home Living Family/patient expects to be discharged to:: Private residence Living Arrangements: Alone Available Help at Discharge: Family;Friend(s);Available PRN/intermittently Type of Home: House Home Access: Stairs to enter Entrance Stairs-Rails: Left Entrance Stairs-Number of Steps: 4   Home Layout: One level Home Equipment: Rollator (4 wheels)      Prior Function Prior Level of Function : Independent/Modified  Independent             Mobility Comments: Ambulatory with BLE AFOs, driving, independent       Hand Dominance        Extremity/Trunk Assessment   Upper Extremity Assessment Upper Extremity Assessment: Overall WFL for tasks assessed    Lower Extremity Assessment Lower Extremity Assessment:  (pt uses personal BLE AFOs (has had them since 2000 following GBS))       Communication   Communication: No difficulties  Cognition Arousal/Alertness: Lethargic Behavior During Therapy: WFL for tasks assessed/performed Overall Cognitive Status: Within Functional Limits for tasks assessed                                 General Comments: Pt requires extra time to awaken but does after PT provides cold wet cloth to wipe face.        General Comments General comments (skin integrity, edema, etc.): Pt received on 3L/min via nasal cannula, weaned to room air & lowest SPO2 88% after gait but recovers well    Exercises     Assessment/Plan    PT Assessment Patient does not need any further PT services  PT Problem List         PT Treatment Interventions      PT Goals (Current goals can be found in the Care Plan section)  Acute Rehab PT Goals Patient Stated Goal: get better PT Goal Formulation: With patient Time For Goal Achievement: 10/01/21 Potential to Achieve Goals: Good    Frequency       Co-evaluation               AM-PAC PT "6 Clicks" Mobility  Outcome Measure Help needed turning from your back to your side while in a flat bed without using bedrails?: None Help needed moving from lying on your back to sitting on the side of a flat bed without using bedrails?: None Help needed moving to and from a bed to a chair (including a wheelchair)?: None Help needed standing up from a chair using your arms (e.g., wheelchair or bedside chair)?: None Help needed to walk in hospital room?: None Help needed climbing 3-5 steps with a railing? : None 6 Click  Score: 24    End of Session Equipment Utilized During Treatment:  (personal BLE AFOs) Activity Tolerance: Patient tolerated treatment well Patient left: in bed;with call bell/phone within reach;with family/visitor present        Time: 6389-3734 PT Time Calculation (min) (ACUTE ONLY): 16 min   Charges:   PT Evaluation $PT Eval Moderate Complexity: 1 Mod          Patrick Downs, PT, DPT 09/17/21, 10:13 AM   Patrick Downs 09/17/2021, 10:12 AM

## 2021-09-17 NOTE — ED Notes (Signed)
Dialysis remains at the bedside.

## 2021-09-17 NOTE — Progress Notes (Signed)
Hemodialysis Post Treatment Note:  Tx date:09/16/2021 Tx time:3 hours and 30 minutes Access: right CVC UF Removed: 3543 ml  Note:  22:00 HD started.  00:19 STAT potassium result came back at 4.9. dialysate bath switched to 2K 01:15 pt complained of cramping on his left arm. UF off. 01:33 HD treatment completed. No complaints. Patient asymptomatic

## 2021-09-17 NOTE — Progress Notes (Signed)
PROGRESS NOTE  Patrick Downs    DOB: 12-30-1975, 46 y.o.  XAJ:[id number]    Code Status: Full Code   DOA: 09/16/2021   LOS: 1   Brief hospital course  Patrick Downs is a 46 y.o. male with a PMH significant for end-stage renal disease on hemodialysis on TTS, CHF, depression, GERD, gout, hypertension and peripheral neuropathy.  They presented from home to the ED on 09/16/2021 with acute worsening of chronic low back pain with generalized weakness x a few days.  He missed his most recent dialysis session.  In the ED, it was found that they had stable vital signs with mild hypertension.  Significant findings included potassium greater than 7.5, calcium 6.7, anion gap 16.  Hgb 10.9, platelets 81.  He had no active bleeding. EKG showed NSR with first-degree AV block. Chest x-ray positive for chronic central vascular congestion without overt edema.  They were initially treated with D50 in order to provide insulin, calcium gluconate, albuterol, Lokelma.  Nephrology was consulted for urgent dialysis.   Patient was admitted to medicine service for further workup and management of hyperkalemia as outlined in detail below.  09/17/21 -stable, improved  Assessment & Plan  Principal Problem:   Fluid overload Active Problems:   Hyperkalemia   Acute midline low back pain without sciatica   Type 2 diabetes mellitus with peripheral neuropathy (HCC)   ESRD on dialysis [facility])   Essential hypertension  ESRD on HD TThS-with missed dialysis sessions.  S/p hemodialysis urgently overnight. - Nephrology managing, appreciate your care   Hyperkalemia-K+ initially greater than 7.5 and has reduced back down to normal range s/p HD and other interventions listed above - RFP a.m.   Acute midline low back pain without sciatica - Analgesia as needed - PT evaluation   Type 2 diabetes mellitus with peripheral neuropathy (HCC) - sSSI - continue home Neurontin.   Essential hypertension - continue home  Catapres.  Body mass index is 31.46 kg/m.  VTE ppx: heparin injection 5,000 Units Start: 09/16/21 2200   Diet:     Diet   Diet Heart Room service appropriate? Yes; Fluid consistency: Thin   Consultants: Nephrology  Subjective 09/17/21    Pt reports no complaints today. Resting comfortably and awakens easily.    Objective   Vitals:   09/17/21 0521 09/17/21 0605 09/17/21 0700 09/17/21 0754  BP: (!) 150/99 99/83 108/78 127/74  Pulse: 87 78 73 84  Resp: 17 16 (!) 9 14  Temp:    97.7 F (36.5 C)  TempSrc:    Oral  SpO2: 97% 100% 98% 99%  Weight:        Intake/Output Summary (Last 24 hours) at 09/17/2021 0819 Last data filed at 09/17/2021 0133 Gross per 24 hour  Intake --  Output 3543 ml  Net -3543 ml   Filed Weights   09/17/21 0445  Weight: 111.1 kg     Physical Exam:  General: awake, alert, NAD HEENT: atraumatic, clear conjunctiva, anicteric sclera, MMM, hearing grossly normal Respiratory: normal respiratory effort. Snoring significant while sleeping Cardiovascular: quick capillary refill, normal S1/S2, RRR, no JVD, murmurs Gastrointestinal: soft, NT, ND Nervous: A&O x3. no gross focal neurologic deficits, normal speech Extremities: moves all equally, no edema, normal tone Skin: dry, intact, normal temperature, normal color. No rashes, lesions or ulcers on exposed skin Psychiatry: normal mood, congruent affect  Labs   I have personally reviewed the following labs and imaging studies CBC    Component Value Date/Time  WBC 4.4 09/17/2021 0600   RBC 3.53 (L) 09/17/2021 0600   HGB 9.7 (L) 09/17/2021 0600   HGB 7.7 (L) 08/22/2014 0653   HCT 31.7 (L) 09/17/2021 0600   HCT 23.9 (L) 08/22/2014 0653   PLT 90 (L) 09/17/2021 0600   PLT 195 08/22/2014 0653   MCV 89.8 09/17/2021 0600   MCV 92 08/22/2014 0653   MCH 27.5 09/17/2021 0600   MCHC 30.6 09/17/2021 0600   RDW 19.5 (H) 09/17/2021 0600   RDW 17.3 (H) 08/22/2014 0653   LYMPHSABS 1.1 09/16/2021 1923    LYMPHSABS 0.9 (L) 08/22/2014 0653   MONOABS 0.5 09/16/2021 1923   MONOABS 1.6 (H) 08/22/2014 0653   EOSABS 0.2 09/16/2021 1923   EOSABS 0.0 08/22/2014 0653   BASOSABS 0.0 09/16/2021 1923   BASOSABS 0.0 08/22/2014 0653      Latest Ref Rng & Units 09/17/2021    6:00 AM 09/16/2021   11:52 PM 09/16/2021    7:23 PM  BMP  Glucose 70 - 99 mg/dL 139    94    BUN 6 - 20 mg/dL 41    79    Creatinine 0.61 - 1.24 mg/dL 11.20    17.39    Sodium 135 - 145 mmol/L 140    141    Potassium 3.5 - 5.1 mmol/L 4.5   4.9   >7.5    Chloride 98 - 111 mmol/L 96    104    CO2 22 - 32 mmol/L 29    21    Calcium 8.9 - 10.3 mg/dL 7.4    6.7      DG Lumbar Spine Complete  Result Date: 09/16/2021 CLINICAL DATA:  Acute on chronic lower back pain EXAM: LUMBAR SPINE - COMPLETE 4+ VIEW; SACRUM AND COCCYX - 2+ VIEW COMPARISON:  08/09/2014 FINDINGS: Lumbar spine: Frontal, bilateral oblique, lateral views of the lumbar spine are obtained. There are 5 non-rib-bearing lumbar type vertebral bodies in grossly normal anatomic alignment. There is increased sclerosis and subtle endplate irregularity involving the inferior endplate of L5 and superior endplate of S1. Mild progressive disc space narrowing at this level. While this could reflect underlying progressive spondylosis, other etiologies including dialysis related spondyloarthropathy or discitis/osteomyelitis could be considered. Remaining disc spaces are well preserved. No acute fractures. Sacrum/coccyx: Pelvic inlet, pelvic outlet, and lateral views are obtained. Please see above discussion for endplate irregularities at the L5-S1 disc space. The remainder of the sacrum and coccyx are unremarkable. Visualized portions of the bony pelvis are normal. Sacroiliac joints are symmetrical with no erosive change. IMPRESSION: 1. Increasing sclerosis and subtle endplate irregularity surrounding the L5-S1 disc space as above. This likely reflects progressive spondylosis, though dialysis  related spondyloarthropathy or discitis/osteomyelitis could be considered depending on clinical presentation. If further imaging is required, MRI of the lumbar spine may be useful. 2. No acute fracture. Electronically Signed   By: Randa Ngo M.D.   On: 09/16/2021 17:33   DG Sacrum/Coccyx  Result Date: 09/16/2021 CLINICAL DATA:  Acute on chronic lower back pain EXAM: LUMBAR SPINE - COMPLETE 4+ VIEW; SACRUM AND COCCYX - 2+ VIEW COMPARISON:  08/09/2014 FINDINGS: Lumbar spine: Frontal, bilateral oblique, lateral views of the lumbar spine are obtained. There are 5 non-rib-bearing lumbar type vertebral bodies in grossly normal anatomic alignment. There is increased sclerosis and subtle endplate irregularity involving the inferior endplate of L5 and superior endplate of S1. Mild progressive disc space narrowing at this level. While this could reflect underlying progressive spondylosis, other  etiologies including dialysis related spondyloarthropathy or discitis/osteomyelitis could be considered. Remaining disc spaces are well preserved. No acute fractures. Sacrum/coccyx: Pelvic inlet, pelvic outlet, and lateral views are obtained. Please see above discussion for endplate irregularities at the L5-S1 disc space. The remainder of the sacrum and coccyx are unremarkable. Visualized portions of the bony pelvis are normal. Sacroiliac joints are symmetrical with no erosive change. IMPRESSION: 1. Increasing sclerosis and subtle endplate irregularity surrounding the L5-S1 disc space as above. This likely reflects progressive spondylosis, though dialysis related spondyloarthropathy or discitis/osteomyelitis could be considered depending on clinical presentation. If further imaging is required, MRI of the lumbar spine may be useful. 2. No acute fracture. Electronically Signed   By: Randa Ngo M.D.   On: 09/16/2021 17:33   MR LUMBAR SPINE WO CONTRAST  Result Date: 09/17/2021 CLINICAL DATA:  Low back pain EXAM: MRI LUMBAR  SPINE WITHOUT CONTRAST TECHNIQUE: Multiplanar, multisequence MR imaging of the lumbar spine was performed. No intravenous contrast was administered. COMPARISON:  10/30/2014 FINDINGS: Segmentation: 5 lumbar type vertebral bodies. Partial sacralization of L5 with bilateral L5-S1 pseudoarticulation of broadened L5 transverse processes with the sacral ala. Alignment:  Mild levocurvature.  3 mm retrolisthesis of L5 on S1. Vertebrae: Redemonstrated mottled appearance of the spine, consistent with chronic renal failure. No acute fracture or suspicious osseous lesion. Congenitally short pedicles, which narrow the AP diameter of the spinal canal. Conus medullaris and cauda equina: Conus extends to the L2 level. Conus and cauda equina appear normal. 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 Disc levels: T12-L1: No significant disc bulge. No spinal canal stenosis or neural foraminal narrowing. L1-L2: No significant disc bulge. Mild facet arthropathy. No spinal canal stenosis or neural foraminal narrowing. L2-L3: No significant disc bulge. No spinal canal stenosis or neural foraminal narrowing. L3-L4: No significant disc bulge. Mild facet arthropathy. No spinal canal stenosis or neural foraminal narrowing. L4-L5: Mild disc bulge. Mild facet arthropathy. No spinal canal stenosis. Mild right neural foraminal narrowing, which is new from the prior exam. L5-S1: Broad-based disc bulge, asymmetric to the right, which has increased in size compared to the prior exam and which contacts the descending right S1 nerves. Narrowing of the left lateral recess could also affect the descending left S1 nerve roots. No significant spinal canal stenosis. Mild facet arthropathy. Moderate bilateral neural foraminal narrowing, which has progressed from the prior exam. IMPRESSION: 1. L5-S1 moderate bilateral neural foraminal narrowing. A right eccentric disc bulge contacts the descending right S1 nerve roots. Narrowing of the left  lateral recess could also affect descending left S1 nerve roots. 2. L4-L5 mild right neural foraminal narrowing. 3. Mottled osseous marrow signal, consistent with chronic renal failure. Electronically Signed   By: Merilyn Baba M.D.   On: 09/17/2021 03:10   DG Chest Portable 1 View  Result Date: 09/16/2021 CLINICAL DATA:  Lower back pain, shortness of breath EXAM: PORTABLE CHEST 1 VIEW COMPARISON:  07/12/2021 FINDINGS: Single frontal view of the chest demonstrates right internal jugular central venous catheter tip overlying the superior vena cava. Cardiac silhouette is stable. Stable chronic central vascular congestion without airspace disease, effusion, or pneumothorax. No acute bony abnormality. IMPRESSION: 1. Chronic central vascular congestion without overt edema. Electronically Signed   By: Randa Ngo M.D.   On: 09/16/2021 19:45    Disposition Plan & Communication  Patient status: Inpatient  Admitted From: Home Planned disposition location: Home Anticipated discharge date: 5/28 pending clinical improvement and electrolyte stability  Family Communication: none  Author: Richarda Osmond, DO [facility] 09/17/2021, 8:19 AM   Available by Epic secure chat 7AM-7PM. If 7PM-7AM, please contact night-coverage.  [facility] contact information found on [email or web address].

## 2021-09-17 NOTE — Progress Notes (Signed)
[facility], [place] 09/17/21  Subjective:   LOS: 1 Patient known to our practice from previous admissions.  Currently dialyzes at [place] and is followed by [facility].   Patient seen sitting up on stretcher, drowsy Family at bedside, Breakfast tray at bedside, partially eaten. PT at bedside preparing to work with patient. Patient states he feels better, breathing has improved. Seen on nasal cannula, but weaning trial planned. Trace lower extremity edema.  Objective:  Vital signs in last 24 hours:  Temp:  [97.4 F (36.3 C)-98.6 F (37 C)] 97.7 F (36.5 C) (05/27 0754) Pulse Rate:  [65-93] 84 (05/27 0754) Resp:  [9-20] 14 (05/27 0754) BP: (99-155)/(74-117) 127/74 (05/27 0754) SpO2:  [92 %-100 %] 99 % (05/27 0754) Weight:  [111.1 kg] 111.1 kg (05/27 0445)  Weight change:  Filed Weights   09/17/21 0445  Weight: 111.1 kg    Intake/Output:    Intake/Output Summary (Last 24 hours) at 09/17/2021 1203 Last data filed at 09/17/2021 0133 Gross per 24 hour  Intake --  Output 3543 ml  Net -3543 ml    Physical Exam: General:  No acute distress, laying in the bed  HEENT  anicteric, moist oral mucous membrane  Pulm/lungs Left basilar rales, normal effort  CVS/Heart  regular rhythm, no rub or gallop  Abdomen:   Soft, nontender  Extremities:  trace peripheral edema  Neurologic:  Somnolent, arousable able to follow commands  Skin:  No acute rashes  Right IJ PermCath  Physical  Basic Metabolic Panel:  Recent Labs  Lab 09/16/21 1923 09/16/21 2015 09/16/21 2352 09/17/21 0600  NA 141  --   --  140  K >7.5*  --  4.9 4.5  CL 104  --   --  96*  CO2 21*  --   --  29  GLUCOSE 94  --   --  139*  BUN 79*  --   --  41*  CREATININE 17.39*  --   --  11.20*  CALCIUM 6.7*  --   --  7.4*  PHOS  --  5.4*  --   --       CBC: Recent Labs  Lab 09/16/21 1923 09/17/21 0600  WBC 5.6 4.4  NEUTROABS 3.7  --   HGB 10.9* 9.7*  HCT 35.9* 31.7*  MCV  89.8 89.8  PLT 81* 90*       Lab Results  Component Value Date   HEPBSAG NON REACTIVE 09/16/2021   HEPBSAB Reactive (A) 09/16/2021   HEPBIGM NON REACTIVE 01/26/2020      Microbiology:  Recent Results (from the past 240 hour(s))  Blood culture (routine x 2)     Status: None (Preliminary result)   Collection Time: 09/16/21  8:15 PM   Specimen: BLOOD  Result Value Ref Range Status   Specimen Description BLOOD BLOOD LEFT FOREARM  Final   Special Requests   Final    BOTTLES DRAWN AEROBIC AND ANAEROBIC Blood Culture results may not be optimal due to an inadequate volume of blood received in culture bottles   Culture   Final    NO GROWTH < 12 HOURS Performed at [facility], [address] [place],  [address]    Report Status PENDING  Incomplete    Coagulation Studies: No results for input(s): LABPROT, INR in the last 72 hours.  Urinalysis: No results for input(s): COLORURINE, LABSPEC, PHURINE, GLUCOSEU, HGBUR, BILIRUBINUR, KETONESUR, PROTEINUR, UROBILINOGEN, NITRITE, LEUKOCYTESUR in the last 72 hours.  Invalid input(s): APPERANCEUR    Imaging: DG Lumbar Spine Complete  Result Date: 09/16/2021 CLINICAL DATA:  Acute on chronic lower back pain EXAM: LUMBAR SPINE - COMPLETE 4+ VIEW; SACRUM AND COCCYX - 2+ VIEW COMPARISON:  08/09/2014 FINDINGS: Lumbar spine: Frontal, bilateral oblique, lateral views of the lumbar spine are obtained. There are 5 non-rib-bearing lumbar type vertebral bodies in grossly normal anatomic alignment. There is increased sclerosis and subtle endplate irregularity involving the inferior endplate of L5 and superior endplate of S1. Mild progressive disc space narrowing at this level. While this could reflect underlying progressive spondylosis, other etiologies including dialysis related spondyloarthropathy or discitis/osteomyelitis could be considered. Remaining disc spaces are well preserved. No acute fractures. Sacrum/coccyx: Pelvic inlet,  pelvic outlet, and lateral views are obtained. Please see above discussion for endplate irregularities at the L5-S1 disc space. The remainder of the sacrum and coccyx are unremarkable. Visualized portions of the bony pelvis are normal. Sacroiliac joints are symmetrical with no erosive change. IMPRESSION: 1. Increasing sclerosis and subtle endplate irregularity surrounding the L5-S1 disc space as above. This likely reflects progressive spondylosis, though dialysis related spondyloarthropathy or discitis/osteomyelitis could be considered depending on clinical presentation. If further imaging is required, MRI of the lumbar spine may be useful. 2. No acute fracture. Electronically Signed   By: Randa Ngo M.D.   On: 09/16/2021 17:33   DG Sacrum/Coccyx  Result Date: 09/16/2021 CLINICAL DATA:  Acute on chronic lower back pain EXAM: LUMBAR SPINE - COMPLETE 4+ VIEW; SACRUM AND COCCYX - 2+ VIEW COMPARISON:  08/09/2014 FINDINGS: Lumbar spine: Frontal, bilateral oblique, lateral views of the lumbar spine are obtained. There are 5 non-rib-bearing lumbar type vertebral bodies in grossly normal anatomic alignment. There is increased sclerosis and subtle endplate irregularity involving the inferior endplate of L5 and superior endplate of S1. Mild progressive disc space narrowing at this level. While this could reflect underlying progressive spondylosis, other etiologies including dialysis related spondyloarthropathy or discitis/osteomyelitis could be considered. Remaining disc spaces are well preserved. No acute fractures. Sacrum/coccyx: Pelvic inlet, pelvic outlet, and lateral views are obtained. Please see above discussion for endplate irregularities at the L5-S1 disc space. The remainder of the sacrum and coccyx are unremarkable. Visualized portions of the bony pelvis are normal. Sacroiliac joints are symmetrical with no erosive change. IMPRESSION: 1. Increasing sclerosis and subtle endplate irregularity surrounding the  L5-S1 disc space as above. This likely reflects progressive spondylosis, though dialysis related spondyloarthropathy or discitis/osteomyelitis could be considered depending on clinical presentation. If further imaging is required, MRI of the lumbar spine may be useful. 2. No acute fracture. Electronically Signed   By: Randa Ngo M.D.   On: 09/16/2021 17:33   MR LUMBAR SPINE WO CONTRAST  Result Date: 09/17/2021 CLINICAL DATA:  Low back pain EXAM: MRI LUMBAR SPINE WITHOUT CONTRAST TECHNIQUE: Multiplanar, multisequence MR imaging of the lumbar spine was performed. No intravenous contrast was administered. COMPARISON:  10/30/2014 FINDINGS: Segmentation: 5 lumbar type vertebral bodies. Partial sacralization of L5 with bilateral L5-S1 pseudoarticulation of broadened L5 transverse processes with the sacral ala. Alignment:  Mild levocurvature.  3 mm retrolisthesis of L5 on S1. Vertebrae: Redemonstrated mottled appearance of the spine, consistent with chronic renal failure. No acute fracture or suspicious osseous lesion. Congenitally short pedicles, which narrow the AP diameter of the spinal canal. Conus medullaris and cauda equina: Conus extends to the L2 level. Conus and cauda equina appear normal. 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 Disc levels: T12-L1: No significant disc bulge. No  spinal canal stenosis or neural foraminal narrowing. L1-L2: No significant disc bulge. Mild facet arthropathy. No spinal canal stenosis or neural foraminal narrowing. L2-L3: No significant disc bulge. No spinal canal stenosis or neural foraminal narrowing. L3-L4: No significant disc bulge. Mild facet arthropathy. No spinal canal stenosis or neural foraminal narrowing. L4-L5: Mild disc bulge. Mild facet arthropathy. No spinal canal stenosis. Mild right neural foraminal narrowing, which is new from the prior exam. L5-S1: Broad-based disc bulge, asymmetric to the right, which has increased in size compared to the prior  exam and which contacts the descending right S1 nerves. Narrowing of the left lateral recess could also affect the descending left S1 nerve roots. No significant spinal canal stenosis. Mild facet arthropathy. Moderate bilateral neural foraminal narrowing, which has progressed from the prior exam. IMPRESSION: 1. L5-S1 moderate bilateral neural foraminal narrowing. A right eccentric disc bulge contacts the descending right S1 nerve roots. Narrowing of the left lateral recess could also affect descending left S1 nerve roots. 2. L4-L5 mild right neural foraminal narrowing. 3. Mottled osseous marrow signal, consistent with chronic renal failure. Electronically Signed   By: Merilyn Baba M.D.   On: 09/17/2021 03:10   DG Chest Portable 1 View  Result Date: 09/16/2021 CLINICAL DATA:  Lower back pain, shortness of breath EXAM: PORTABLE CHEST 1 VIEW COMPARISON:  07/12/2021 FINDINGS: Single frontal view of the chest demonstrates right internal jugular central venous catheter tip overlying the superior vena cava. Cardiac silhouette is stable. Stable chronic central vascular congestion without airspace disease, effusion, or pneumothorax. No acute bony abnormality. IMPRESSION: 1. Chronic central vascular congestion without overt edema. Electronically Signed   By: Randa Ngo M.D.   On: 09/16/2021 19:45     Medications:    promethazine (PHENERGAN) injection (IM or IVPB)      calcitRIOL  0.25 mcg Oral Daily   calcium carbonate  1,250 mg Oral BID WC   Chlorhexidine Gluconate Cloth  6 each Topical Q0600   furosemide  60 mg Intravenous Q12H   gabapentin  600 mg Oral QHS   heparin  5,000 Units Subcutaneous Q8H   hydrOXYzine  25 mg Oral BID     Assessment/ Plan:  46 y.o. 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  Severe hyperkalemia in setting of ESRD Severe hyperkalemia due to missing dialysis on Thursday and dietary noncompliance.  Ate a big portion of watermelon and pineapple per patient report. -Emergent dialysis received overnight to correct potassium. Potassium 4.5 this morning. UF 3.5L achieved to manage shortness of breath -Will schedule dialysis later today to maintain outpatient schedule. UF goal 2L as tolerated.    #Shortness of breath and volume overload -Weaning trial planned in ED. At time of this note, patient on room air with sats 99%. Will perform scheduled dialysis with additional fluid removal.  #. Anemia of CKD  Lab Results  Component Value Date   HGB 9.7 (L) 09/17/2021  Hgb within acceptable range.  Low dose EPO with HD if hemoglobin  #. Secondary  hyperparathyroidism of renal origin N 25.81      Component Value Date/Time   PTH 35 01/25/2020 0944   Lab Results  Component Value Date   PHOS 5.4 (H) 09/16/2021   Phosphorus within desired range, monitoring closely  #Hypocalcemia Received IV calcium gluconate as a part of treatment for hyperkalemia. -Receiving calcitriol and oral calcium+ vitamin D supplements. - Calcium improved to 7.4 today, will continue to monitor     LOS: 1   5/27/202312:03 PM  [facility] [place], [place]

## 2021-09-17 NOTE — Progress Notes (Signed)
Hemodialysis Post Treatment Note  Sep 17, 2021  Access: RIJ CVC   Treatment Time Scheduled: 3 hrs.   UF Removed: 2-liters   BFR: 400  Next Scheduled Treatment: TBD  Note:  Patient presents for an additional treatment, have being seen in ED for volume overload and elevated K+. Patient is without signs or symptoms of hyperkalemia, on presentation. Vital signs remained stable throughout course of treatment, afebrile, no complaints of chest pain, or SOB. Patient states that had eaten several high K+ foods.   CVC functioned well, maintaining the prescribed blood flow rate, targeted UF reached, with 2-liter fluid removal. CVC shows no signs of infection. Patient transferred from dialysis suite to room 246, offers up no concerns. Report given to receiving nurse.

## 2021-09-18 LAB — RENAL FUNCTION PANEL
Albumin: 4.2 g/dL (ref 3.5–5.0)
Anion gap: 12 (ref 5–15)
BUN: 34 mg/dL — ABNORMAL HIGH (ref 6–20)
CO2: 26 mmol/L (ref 22–32)
Calcium: 7.3 mg/dL — ABNORMAL LOW (ref 8.9–10.3)
Chloride: 102 mmol/L (ref 98–111)
Creatinine, Ser: 9.3 mg/dL — ABNORMAL HIGH (ref 0.61–1.24)
GFR, Estimated: 7 mL/min — ABNORMAL LOW (ref >60)
Glucose, Bld: 92 mg/dL (ref 70–99)
Phosphorus: 4.7 mg/dL — ABNORMAL HIGH (ref 2.5–4.6)
Potassium: 5.7 mmol/L — ABNORMAL HIGH (ref 3.5–5.1)
Sodium: 140 mmol/L (ref 135–145)

## 2021-09-18 LAB — PROCALCITONIN: Procalcitonin: 1.17 ng/mL

## 2021-09-18 MED ORDER — ALBUTEROL SULFATE (2.5 MG/3ML) 0.083% IN NEBU
2.5000 mg | INHALATION_SOLUTION | Freq: Once | RESPIRATORY_TRACT | Status: AC
  Administered 2021-09-18: 2.5 mg via RESPIRATORY_TRACT
  Filled 2021-09-18: qty 3

## 2021-09-18 MED ORDER — CALCIUM ACETATE (PHOS BINDER) 667 MG PO CAPS
2001.0000 mg | ORAL_CAPSULE | Freq: Three times a day (TID) | ORAL | Status: DC
  Administered 2021-09-18 – 2021-09-19 (×5): 2001 mg via ORAL
  Filled 2021-09-18 (×5): qty 3

## 2021-09-18 MED ORDER — SODIUM ZIRCONIUM CYCLOSILICATE 10 G PO PACK
10.0000 g | PACK | Freq: Two times a day (BID) | ORAL | Status: AC
  Administered 2021-09-18 (×2): 10 g via ORAL
  Filled 2021-09-18 (×2): qty 1

## 2021-09-18 MED ORDER — CLONIDINE HCL 0.1 MG/24HR TD PTWK: 0.1000 mg | MEDICATED_PATCH | TRANSDERMAL | Status: DC

## 2021-09-18 MED ORDER — FERRIC CITRATE 1 GM 210 MG(FE) PO TABS
420.0000 mg | ORAL_TABLET | Freq: Three times a day (TID) | ORAL | Status: DC
  Administered 2021-09-18 – 2021-09-19 (×4): 420 mg via ORAL
  Filled 2021-09-18 (×6): qty 2

## 2021-09-18 NOTE — Progress Notes (Addendum)
[facility], [place] 09/18/21  Subjective:   LOS: 2 Patient known to our practice from previous admissions.  Currently dialyzes at [place] and is followed by [facility].   Patient seen laying in bed, more alert than yesterday States he hasn't eaten  Denies shortness of breath, weaned to room air Minimal lower extremity edema  Potassium 5.7  Objective:  Vital signs in last 24 hours:  Temp:  [97.4 F (36.3 C)-98.2 F (36.8 C)] 97.5 F (36.4 C) (05/28 0750) Pulse Rate:  [63-87] 66 (05/28 0750) Resp:  [7-21] 18 (05/28 0750) BP: (99-135)/(72-118) 119/92 (05/28 0750) SpO2:  [95 %-100 %] 96 % (05/28 0840) Weight:  [118.1 kg-120.7 kg] 119.1 kg (05/28 0305)  Weight change: 9.569 kg Filed Weights   09/17/21 1723 09/18/21 0054 09/18/21 0305  Weight: 118.1 kg 119.1 kg 119.1 kg    Intake/Output:    Intake/Output Summary (Last 24 hours) at 09/18/2021 1128 Last data filed at 09/18/2021 1004 Gross per 24 hour  Intake 480 ml  Output 2000 ml  Net -1520 ml     Physical Exam: General:  No acute distress, laying in the bed  HEENT  anicteric, moist oral mucous membrane  Pulm/lungs Diminished bases, normal effort  CVS/Heart  regular rhythm, no rub or gallop  Abdomen:   Soft, nontender  Extremities:  trace peripheral edema  Neurologic:  Alert,  able to follow commands  Skin:  No acute rashes  Right IJ PermCath  Physical  Basic Metabolic Panel:  Recent Labs  Lab 09/16/21 1923 09/16/21 2015 09/16/21 2352 09/17/21 0600 09/18/21 0311  NA 141  --   --  140 140  K >7.5*  --  4.9 4.5 5.7*  CL 104  --   --  96* 102  CO2 21*  --   --  29 26  GLUCOSE 94  --   --  139* 92  BUN 79*  --   --  41* 34*  CREATININE 17.39*  --   --  11.20* 9.30*  CALCIUM 6.7*  --   --  7.4* 7.3*  PHOS  --  5.4*  --   --  4.7*      CBC: Recent Labs  Lab 09/16/21 1923 09/17/21 0600  WBC 5.6 4.4  NEUTROABS 3.7  --   HGB 10.9* 9.7*  HCT 35.9* 31.7*  MCV 89.8  89.8  PLT 81* 90*       Lab Results  Component Value Date   HEPBSAG NON REACTIVE 09/16/2021   HEPBSAB Reactive (A) 09/16/2021   HEPBIGM NON REACTIVE 01/26/2020      Microbiology:  Recent Results (from the past 240 hour(s))  Blood culture (routine x 2)     Status: None (Preliminary result)   Collection Time: 09/16/21  8:15 PM   Specimen: BLOOD  Result Value Ref Range Status   Specimen Description BLOOD BLOOD LEFT FOREARM  Final   Special Requests   Final    BOTTLES DRAWN AEROBIC AND ANAEROBIC Blood Culture results may not be optimal due to an inadequate volume of blood received in culture bottles   Culture   Final    NO GROWTH 2 DAYS Performed at [facility], [address] [place], [place] [id number]    Report Status PENDING  Incomplete    Coagulation Studies: No results for input(s): LABPROT, INR in the last 72 hours.  Urinalysis: No results for input(s): COLORURINE, LABSPEC, [place], [place], [place], [place], [place], [place], [place], NITRITE, LEUKOCYTESUR  in the last 72 hours.  Invalid input(s): APPERANCEUR    Imaging: DG Lumbar Spine Complete  Result Date: 09/16/2021 CLINICAL DATA:  Acute on chronic lower back pain EXAM: LUMBAR SPINE - COMPLETE 4+ VIEW; SACRUM AND COCCYX - 2+ VIEW COMPARISON:  08/09/2014 FINDINGS: Lumbar spine: Frontal, bilateral oblique, lateral views of the lumbar spine are obtained. There are 5 non-rib-bearing lumbar type vertebral bodies in grossly normal anatomic alignment. There is increased sclerosis and subtle endplate irregularity involving the inferior endplate of L5 and superior endplate of S1. Mild progressive disc space narrowing at this level. While this could reflect underlying progressive spondylosis, other etiologies including dialysis related spondyloarthropathy or discitis/osteomyelitis could be considered. Remaining disc spaces are well preserved. No acute fractures. Sacrum/coccyx: Pelvic inlet, pelvic  outlet, and lateral views are obtained. Please see above discussion for endplate irregularities at the L5-S1 disc space. The remainder of the sacrum and coccyx are unremarkable. Visualized portions of the bony pelvis are normal. Sacroiliac joints are symmetrical with no erosive change. IMPRESSION: 1. Increasing sclerosis and subtle endplate irregularity surrounding the L5-S1 disc space as above. This likely reflects progressive spondylosis, though dialysis related spondyloarthropathy or discitis/osteomyelitis could be considered depending on clinical presentation. If further imaging is required, MRI of the lumbar spine may be useful. 2. No acute fracture. Electronically Signed   By: Randa Ngo M.D.   On: 09/16/2021 17:33   DG Sacrum/Coccyx  Result Date: 09/16/2021 CLINICAL DATA:  Acute on chronic lower back pain EXAM: LUMBAR SPINE - COMPLETE 4+ VIEW; SACRUM AND COCCYX - 2+ VIEW COMPARISON:  08/09/2014 FINDINGS: Lumbar spine: Frontal, bilateral oblique, lateral views of the lumbar spine are obtained. There are 5 non-rib-bearing lumbar type vertebral bodies in grossly normal anatomic alignment. There is increased sclerosis and subtle endplate irregularity involving the inferior endplate of L5 and superior endplate of S1. Mild progressive disc space narrowing at this level. While this could reflect underlying progressive spondylosis, other etiologies including dialysis related spondyloarthropathy or discitis/osteomyelitis could be considered. Remaining disc spaces are well preserved. No acute fractures. Sacrum/coccyx: Pelvic inlet, pelvic outlet, and lateral views are obtained. Please see above discussion for endplate irregularities at the L5-S1 disc space. The remainder of the sacrum and coccyx are unremarkable. Visualized portions of the bony pelvis are normal. Sacroiliac joints are symmetrical with no erosive change. IMPRESSION: 1. Increasing sclerosis and subtle endplate irregularity surrounding the L5-S1  disc space as above. This likely reflects progressive spondylosis, though dialysis related spondyloarthropathy or discitis/osteomyelitis could be considered depending on clinical presentation. If further imaging is required, MRI of the lumbar spine may be useful. 2. No acute fracture. Electronically Signed   By: Randa Ngo M.D.   On: 09/16/2021 17:33   MR LUMBAR SPINE WO CONTRAST  Result Date: 09/17/2021 CLINICAL DATA:  Low back pain EXAM: MRI LUMBAR SPINE WITHOUT CONTRAST TECHNIQUE: Multiplanar, multisequence MR imaging of the lumbar spine was performed. No intravenous contrast was administered. COMPARISON:  10/30/2014 FINDINGS: Segmentation: 5 lumbar type vertebral bodies. Partial sacralization of L5 with bilateral L5-S1 pseudoarticulation of broadened L5 transverse processes with the sacral ala. Alignment:  Mild levocurvature.  3 mm retrolisthesis of L5 on S1. Vertebrae: Redemonstrated mottled appearance of the spine, consistent with chronic renal failure. No acute fracture or suspicious osseous lesion. Congenitally short pedicles, which narrow the AP diameter of the spinal canal. Conus medullaris and cauda equina: Conus extends to the L2 level. Conus and cauda equina appear normal. 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 Disc levels:  T12-L1: No significant disc bulge. No spinal canal stenosis or neural foraminal narrowing. L1-L2: No significant disc bulge. Mild facet arthropathy. No spinal canal stenosis or neural foraminal narrowing. L2-L3: No significant disc bulge. No spinal canal stenosis or neural foraminal narrowing. L3-L4: No significant disc bulge. Mild facet arthropathy. No spinal canal stenosis or neural foraminal narrowing. L4-L5: Mild disc bulge. Mild facet arthropathy. No spinal canal stenosis. Mild right neural foraminal narrowing, which is new from the prior exam. L5-S1: Broad-based disc bulge, asymmetric to the right, which has increased in size compared to the prior exam  and which contacts the descending right S1 nerves. Narrowing of the left lateral recess could also affect the descending left S1 nerve roots. No significant spinal canal stenosis. Mild facet arthropathy. Moderate bilateral neural foraminal narrowing, which has progressed from the prior exam. IMPRESSION: 1. L5-S1 moderate bilateral neural foraminal narrowing. A right eccentric disc bulge contacts the descending right S1 nerve roots. Narrowing of the left lateral recess could also affect descending left S1 nerve roots. 2. L4-L5 mild right neural foraminal narrowing. 3. Mottled osseous marrow signal, consistent with chronic renal failure. Electronically Signed   By: Merilyn Baba M.D.   On: 09/17/2021 03:10   DG Chest Portable 1 View  Result Date: 09/16/2021 CLINICAL DATA:  Lower back pain, shortness of breath EXAM: PORTABLE CHEST 1 VIEW COMPARISON:  07/12/2021 FINDINGS: Single frontal view of the chest demonstrates right internal jugular central venous catheter tip overlying the superior vena cava. Cardiac silhouette is stable. Stable chronic central vascular congestion without airspace disease, effusion, or pneumothorax. No acute bony abnormality. IMPRESSION: 1. Chronic central vascular congestion without overt edema. Electronically Signed   By: Randa Ngo M.D.   On: 09/16/2021 19:45     Medications:    promethazine (PHENERGAN) injection (IM or IVPB)      calcitRIOL  0.25 mcg Oral Daily   calcium acetate  2,001 mg Oral TID with meals   calcium carbonate  1,250 mg Oral BID WC   Chlorhexidine Gluconate Cloth  6 each Topical Q0600   [START ON 09/21/2021] cloNIDine  0.1 mg Transdermal Weekly   ferric citrate  420 mg Oral TID   furosemide  60 mg Intravenous Q12H   gabapentin  600 mg Oral QHS   hydrOXYzine  25 mg Oral BID   sodium zirconium cyclosilicate  10 g Oral BID     Assessment/ Plan:  46 y.o. 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  Severe hyperkalemia in setting of ESRD Severe hyperkalemia due to missing dialysis on Thursday and dietary noncompliance.  Ate a big portion of watermelon and pineapple per patient report. Emergent dialysis received on admission..  -Received dialysis yesterday to maintain outpatient schedule. UF 2L achieved. Total 5.5L UF removed during dialysis this admission. Improved respiratory status, weaned to room air.  -Potassium 5.7 this am. Lokelma prescribed by primary team.   #Shortness of breath and volume overload -Successfully weaned to room air.  Additional 2L removed with dialysis  #. Anemia of CKD  Lab Results  Component Value Date   HGB 9.7 (L) 09/17/2021  Hgb at goal Continue low dose EPO with HD if hemoglobin  #. Secondary hyperparathyroidism of renal origin N 25.81      Component Value Date/Time   PTH 35 01/25/2020 0944   Lab Results  Component Value Date   PHOS 4.7 (H) 09/18/2021   Phosphorus at goal and calcium decreased likely due to poor oral nutrition. Continue calcium acetate and continue calcium carbonate.   #Hypocalcemia Received IV calcium gluconate as a part of treatment for hyperkalemia. -Receiving calcitriol and oral calcium+ vitamin D supplements.    LOS: [place] 5/28/202311:28 [place], [place]

## 2021-09-18 NOTE — Progress Notes (Signed)
PROGRESS NOTE  Patrick Downs    DOB: Jan 09, 1976, 46 y.o.  Patrick Downs    Code Status: Full Code   DOA: 09/16/2021   LOS: 2   Brief hospital course  Patrick Downs is a 46 y.o. male with a PMH significant for end-stage renal disease on hemodialysis on TTS, CHF, depression, GERD, gout, hypertension and peripheral neuropathy.  They presented from home to the ED on 09/16/2021 with acute worsening of chronic low back pain with generalized weakness x a few days.  He missed his most recent dialysis session.  In the ED, it was found that they had stable vital signs with mild hypertension.  Significant findings included potassium greater than 7.5, calcium 6.7, anion gap 16.  Hgb 10.9, platelets 81.  He had no active bleeding. EKG showed NSR with first-degree AV block. Chest x-ray positive for chronic central vascular congestion without overt edema.  They were initially treated with D50 in order to provide insulin, calcium gluconate, albuterol, Lokelma.  Nephrology was consulted for urgent dialysis.   Patient was admitted to medicine service for further workup and management of hyperkalemia as outlined in detail below.  09/18/21 -stable, improved  Assessment & Plan  Principal Problem:   Fluid overload Active Problems:   Hyperkalemia   Acute midline low back pain without sciatica   Type 2 diabetes mellitus with peripheral neuropathy (HCC)   ESRD on dialysis [facility])   Essential hypertension  ESRD on HD TThS-with missed dialysis sessions.  S/p hemodialysis urgently on admission.  - Nephrology managing, appreciate your care   Hyperkalemia-K+ initially greater than 7.5 and has reduced back down to normal range s/p HD and other interventions listed above but has elevated again today to 5.7. Patient is asymptomatic.  - loklema BID today - RFP a.m. - no urgent HD today, per nephro.  - discontinued heparin ppx   Acute midline low back pain without sciatica - Analgesia as needed - PT  evaluation   Type 2 diabetes mellitus with peripheral neuropathy (HCC) - sSSI - continue home Neurontin.   Essential hypertension - continue home Catapres.  Body mass index is 33.71 kg/m.  VTE ppx: ambulatory  Diet:     Diet   Diet Heart Room service appropriate? Yes; Fluid consistency: Thin   Consultants: Nephrology  Subjective 09/18/21    Pt reports no complaints today. He is surprised about his potassium elevation again and states this is unusual for him. Denies any symptoms.   Objective   Vitals:   09/18/21 0054 09/18/21 0305 09/18/21 0600 09/18/21 0750  BP:   (!) 133/95 (!) 119/92  Pulse:    66  Resp:    18  Temp:    (!) 97.5 F (36.4 C)  TempSrc:    Oral  SpO2:    97%  Weight: 119.1 kg 119.1 kg    Height:        Intake/Output Summary (Last 24 hours) at 09/18/2021 0807 Last data filed at 09/17/2021 1916 Gross per 24 hour  Intake 120 ml  Output 2000 ml  Net -1880 ml    Filed Weights   09/17/21 1723 09/18/21 0054 09/18/21 0305  Weight: 118.1 kg 119.1 kg 119.1 kg     Physical Exam:  General: awake, alert, NAD HEENT: atraumatic, clear conjunctiva, anicteric sclera, MMM, hearing grossly normal Respiratory: normal respiratory effort. Snoring significant while sleeping Cardiovascular: quick capillary refill, normal S1/S2, RRR, no JVD, murmurs Gastrointestinal: soft, NT, ND Nervous: A&O x3. no gross focal neurologic deficits,  normal speech Extremities: moves all equally, no edema, normal tone Skin: dry, intact, normal temperature, normal color. No rashes, lesions or ulcers on exposed skin Psychiatry: normal mood, congruent affect  Labs   I have personally reviewed the following labs and imaging studies CBC    Component Value Date/Time   WBC 4.4 09/17/2021 0600   RBC 3.53 (L) 09/17/2021 0600   HGB 9.7 (L) 09/17/2021 0600   HGB 7.7 (L) 08/22/2014 0653   HCT 31.7 (L) 09/17/2021 0600   HCT 23.9 (L) 08/22/2014 0653   PLT 90 (L) 09/17/2021 0600   PLT  195 08/22/2014 0653   MCV 89.8 09/17/2021 0600   MCV 92 08/22/2014 0653   MCH 27.5 09/17/2021 0600   MCHC 30.6 09/17/2021 0600   RDW 19.5 (H) 09/17/2021 0600   RDW 17.3 (H) 08/22/2014 0653   LYMPHSABS 1.1 09/16/2021 1923   LYMPHSABS 0.9 (L) 08/22/2014 0653   MONOABS 0.5 09/16/2021 1923   MONOABS 1.6 (H) 08/22/2014 0653   EOSABS 0.2 09/16/2021 1923   EOSABS 0.0 08/22/2014 0653   BASOSABS 0.0 09/16/2021 1923   BASOSABS 0.0 08/22/2014 0653      Latest Ref Rng & Units 09/18/2021    3:11 AM 09/17/2021    6:00 AM 09/16/2021   11:52 PM  BMP  Glucose 70 - 99 mg/dL 92   139     BUN 6 - 20 mg/dL 34   41     Creatinine 0.61 - 1.24 mg/dL 9.30   11.20     Sodium 135 - 145 mmol/L 140   140     Potassium 3.5 - 5.1 mmol/L 5.7   4.5   4.9    Chloride 98 - 111 mmol/L 102   96     CO2 22 - 32 mmol/L 26   29     Calcium 8.9 - 10.3 mg/dL 7.3   7.4       DG Lumbar Spine Complete  Result Date: 09/16/2021 CLINICAL DATA:  Acute on chronic lower back pain EXAM: LUMBAR SPINE - COMPLETE 4+ VIEW; SACRUM AND COCCYX - 2+ VIEW COMPARISON:  08/09/2014 FINDINGS: Lumbar spine: Frontal, bilateral oblique, lateral views of the lumbar spine are obtained. There are 5 non-rib-bearing lumbar type vertebral bodies in grossly normal anatomic alignment. There is increased sclerosis and subtle endplate irregularity involving the inferior endplate of L5 and superior endplate of S1. Mild progressive disc space narrowing at this level. While this could reflect underlying progressive spondylosis, other etiologies including dialysis related spondyloarthropathy or discitis/osteomyelitis could be considered. Remaining disc spaces are well preserved. No acute fractures. Sacrum/coccyx: Pelvic inlet, pelvic outlet, and lateral views are obtained. Please see above discussion for endplate irregularities at the L5-S1 disc space. The remainder of the sacrum and coccyx are unremarkable. Visualized portions of the bony pelvis are normal.  Sacroiliac joints are symmetrical with no erosive change. IMPRESSION: 1. Increasing sclerosis and subtle endplate irregularity surrounding the L5-S1 disc space as above. This likely reflects progressive spondylosis, though dialysis related spondyloarthropathy or discitis/osteomyelitis could be considered depending on clinical presentation. If further imaging is required, MRI of the lumbar spine may be useful. 2. No acute fracture. Electronically Signed   By: Randa Ngo M.D.   On: 09/16/2021 17:33   DG Sacrum/Coccyx  Result Date: 09/16/2021 CLINICAL DATA:  Acute on chronic lower back pain EXAM: LUMBAR SPINE - COMPLETE 4+ VIEW; SACRUM AND COCCYX - 2+ VIEW COMPARISON:  08/09/2014 FINDINGS: Lumbar spine: Frontal, bilateral oblique, lateral views of  the lumbar spine are obtained. There are 5 non-rib-bearing lumbar type vertebral bodies in grossly normal anatomic alignment. There is increased sclerosis and subtle endplate irregularity involving the inferior endplate of L5 and superior endplate of S1. Mild progressive disc space narrowing at this level. While this could reflect underlying progressive spondylosis, other etiologies including dialysis related spondyloarthropathy or discitis/osteomyelitis could be considered. Remaining disc spaces are well preserved. No acute fractures. Sacrum/coccyx: Pelvic inlet, pelvic outlet, and lateral views are obtained. Please see above discussion for endplate irregularities at the L5-S1 disc space. The remainder of the sacrum and coccyx are unremarkable. Visualized portions of the bony pelvis are normal. Sacroiliac joints are symmetrical with no erosive change. IMPRESSION: 1. Increasing sclerosis and subtle endplate irregularity surrounding the L5-S1 disc space as above. This likely reflects progressive spondylosis, though dialysis related spondyloarthropathy or discitis/osteomyelitis could be considered depending on clinical presentation. If further imaging is required, MRI  of the lumbar spine may be useful. 2. No acute fracture. Electronically Signed   By: Randa Ngo M.D.   On: 09/16/2021 17:33   MR LUMBAR SPINE WO CONTRAST  Result Date: 09/17/2021 CLINICAL DATA:  Low back pain EXAM: MRI LUMBAR SPINE WITHOUT CONTRAST TECHNIQUE: Multiplanar, multisequence MR imaging of the lumbar spine was performed. No intravenous contrast was administered. COMPARISON:  10/30/2014 FINDINGS: Segmentation: 5 lumbar type vertebral bodies. Partial sacralization of L5 with bilateral L5-S1 pseudoarticulation of broadened L5 transverse processes with the sacral ala. Alignment:  Mild levocurvature.  3 mm retrolisthesis of L5 on S1. Vertebrae: Redemonstrated mottled appearance of the spine, consistent with chronic renal failure. No acute fracture or suspicious osseous lesion. Congenitally short pedicles, which narrow the AP diameter of the spinal canal. Conus medullaris and cauda equina: Conus extends to the L2 level. Conus and cauda equina appear normal. 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 Disc levels: T12-L1: No significant disc bulge. No spinal canal stenosis or neural foraminal narrowing. L1-L2: No significant disc bulge. Mild facet arthropathy. No spinal canal stenosis or neural foraminal narrowing. L2-L3: No significant disc bulge. No spinal canal stenosis or neural foraminal narrowing. L3-L4: No significant disc bulge. Mild facet arthropathy. No spinal canal stenosis or neural foraminal narrowing. L4-L5: Mild disc bulge. Mild facet arthropathy. No spinal canal stenosis. Mild right neural foraminal narrowing, which is new from the prior exam. L5-S1: Broad-based disc bulge, asymmetric to the right, which has increased in size compared to the prior exam and which contacts the descending right S1 nerves. Narrowing of the left lateral recess could also affect the descending left S1 nerve roots. No significant spinal canal stenosis. Mild facet arthropathy. Moderate bilateral neural  foraminal narrowing, which has progressed from the prior exam. IMPRESSION: 1. L5-S1 moderate bilateral neural foraminal narrowing. A right eccentric disc bulge contacts the descending right S1 nerve roots. Narrowing of the left lateral recess could also affect descending left S1 nerve roots. 2. L4-L5 mild right neural foraminal narrowing. 3. Mottled osseous marrow signal, consistent with chronic renal failure. Electronically Signed   By: Merilyn Baba M.D.   On: 09/17/2021 03:10   DG Chest Portable 1 View  Result Date: 09/16/2021 CLINICAL DATA:  Lower back pain, shortness of breath EXAM: PORTABLE CHEST 1 VIEW COMPARISON:  07/12/2021 FINDINGS: Single frontal view of the chest demonstrates right internal jugular central venous catheter tip overlying the superior vena cava. Cardiac silhouette is stable. Stable chronic central vascular congestion without airspace disease, effusion, or pneumothorax. No acute bony abnormality. IMPRESSION: 1. Chronic central vascular congestion  without overt edema. Electronically Signed   By: Randa Ngo M.D.   On: 09/16/2021 19:45    Disposition Plan & Communication  Patient status: Inpatient  Admitted From: Home Planned disposition location: Home Anticipated discharge date: 5/29 pending clinical improvement and electrolyte stability  Family Communication: none    Author: Richarda Osmond, DO [facility] 09/18/2021, 8:07 AM   Available by Epic secure chat 7AM-7PM. If 7PM-7AM, please contact night-coverage.  [facility] contact information found on [email or web address].

## 2021-09-19 LAB — BASIC METABOLIC PANEL
Anion gap: 17 — ABNORMAL HIGH (ref 5–15)
BUN: 54 mg/dL — ABNORMAL HIGH (ref 6–20)
CO2: 26 mmol/L (ref 22–32)
Calcium: 7.4 mg/dL — ABNORMAL LOW (ref 8.9–10.3)
Chloride: 97 mmol/L — ABNORMAL LOW (ref 98–111)
Creatinine, Ser: 12.04 mg/dL — ABNORMAL HIGH (ref 0.61–1.24)
GFR, Estimated: 5 mL/min — ABNORMAL LOW (ref >60)
Glucose, Bld: 113 mg/dL — ABNORMAL HIGH (ref 70–99)
Potassium: 5.1 mmol/L (ref 3.5–5.1)
Sodium: 140 mmol/L (ref 135–145)

## 2021-09-19 LAB — RENAL FUNCTION PANEL
Albumin: 4 g/dL (ref 3.5–5.0)
Anion gap: 15 (ref 5–15)
BUN: 52 mg/dL — ABNORMAL HIGH (ref 6–20)
CO2: 25 mmol/L (ref 22–32)
Calcium: 7.3 mg/dL — ABNORMAL LOW (ref 8.9–10.3)
Chloride: 100 mmol/L (ref 98–111)
Creatinine, Ser: 11.18 mg/dL — ABNORMAL HIGH (ref 0.61–1.24)
GFR, Estimated: 5 mL/min — ABNORMAL LOW (ref >60)
Glucose, Bld: 119 mg/dL — ABNORMAL HIGH (ref 70–99)
Phosphorus: 5.4 mg/dL — ABNORMAL HIGH (ref 2.5–4.6)
Potassium: 5.2 mmol/L — ABNORMAL HIGH (ref 3.5–5.1)
Sodium: 140 mmol/L (ref 135–145)

## 2021-09-19 MED ORDER — SODIUM ZIRCONIUM CYCLOSILICATE 10 G PO PACK
10.0000 g | PACK | Freq: Two times a day (BID) | ORAL | Status: DC
  Administered 2021-09-19: 10 g via ORAL
  Filled 2021-09-19: qty 1

## 2021-09-19 NOTE — Progress Notes (Signed)
Patrick Downs to be D/C'd Home per MD order.  Discussed prescriptions and follow up appointments with the patient. No Prescriptions given to patient, medication list explained in detail. Pt verbalized understanding.  Allergies as of 09/19/2021       Reactions   Hepatitis B Vaccine    Other reaction(s): Unknown   Ondansetron Other (See Comments)   Stomach pain    Minoxidil Other (See Comments)   "put fluid around my heart", PERICARDIAL EFFUSION   Morphine And Related Other (See Comments)   Aggressive    Omnipaque [iohexol] Itching, Other (See Comments)   Rigors on one occasion, widespread itching on a separate occasion (resolved with Benadryl), tremors        Medication List     STOP taking these medications    brompheniramine-pseudoephedrine-DM 30-2-10 MG/5ML syrup   diphenhydrAMINE 25 mg capsule Commonly known as: BENADRYL   midodrine 5 MG tablet Commonly known as: PROAMATINE       TAKE these medications    acetaminophen 500 MG tablet Commonly known as: TYLENOL Take 1,000-1,500 mg by mouth 2 (two) times daily as needed for moderate pain or headache.   Auryxia 1 GM 210 MG(Fe) tablet Generic drug: ferric citrate Take 420 mg by mouth 3 (three) times daily.   calcium acetate 667 MG capsule Commonly known as: PHOSLO Take 2,001 mg by mouth 3 (three) times daily.   cloNIDine 0.1 mg/24hr patch Commonly known as: CATAPRES - Dosed in mg/24 hr Place 0.1 mg onto the skin once a week.   gabapentin 300 MG capsule Commonly known as: NEURONTIN Take 600 mg by mouth at bedtime.   hydrOXYzine 25 MG tablet Commonly known as: ATARAX Take 1 tablet by mouth 2 (two) times daily.   oxyCODONE-acetaminophen 10-325 MG tablet Commonly known as: Percocet Take 1 tablet by mouth every 4 (four) hours as needed for pain. What changed: Another medication with the same name was removed. Continue taking this medication, and follow the directions you see here.   sildenafil 100 MG  tablet Commonly known as: VIAGRA Take 100 mg by mouth daily as needed for erectile dysfunction.        Vitals:   09/19/21 0817 09/19/21 1213  BP: (!) 127/96 121/71  Pulse: 69 85  Resp: 18 18  Temp: 97.6 F (36.4 C) 98.5 F (36.9 C)  SpO2: 96% 95%    Skin clean, dry and intact without evidence of skin break down, no evidence of skin tears noted. IV catheter discontinued intact. Site without signs and symptoms of complications. Dressing and pressure applied. Pt denies pain at this time. No complaints noted.  An After Visit Summary was printed and given to the patient. Patient waiting on ride to be discharged home [place]

## 2021-09-19 NOTE — Discharge Instructions (Signed)
Please follow up with your regular doctor within the next 1-2 weeks for hospital follow up appointment. Continue with your regularly scheduled dialysis.  Please modify your diet to avoid foods containing high amounts of potassium

## 2021-09-19 NOTE — Progress Notes (Signed)
[facility], [place] 09/19/21  Subjective:   LOS: 3 Patient known to our practice from previous admissions.  Currently dialyzes at [place] and is followed by [facility].   Patient seen sitting up in chair, alert and oriented Tolerating meals without nausea and vomiting Patient states he has been eating potatoes with his meals.  States he is not on a designated diet outpatient.  But knows that potatoes are high in potassium, states his diet is limited here so he has no choice.  He opted for rice with dinner last night.  Patient requesting liberalize diet  Potassium 5.2  Objective:  Vital signs in last 24 hours:  Temp:  [97.5 F (36.4 C)-98.5 F (36.9 C)] 98.5 F (36.9 C) (05/29 1213) Pulse Rate:  [69-85] 85 (05/29 1213) Resp:  [18-20] 18 (05/29 1213) BP: (93-131)/(54-96) 121/71 (05/29 1213) SpO2:  [94 %-97 %] 95 % (05/29 1213) Weight:  [118.8 kg] 118.8 kg (05/29 0433)  Weight change: -1.9 kg Filed Weights   09/18/21 0054 09/18/21 0305 09/19/21 0433  Weight: 119.1 kg 119.1 kg 118.8 kg    Intake/Output:   No intake or output data in the 24 hours ending 09/19/21 1216   Physical Exam: General:  No acute distress, laying in the bed  HEENT  anicteric, moist oral mucous membrane  Pulm/lungs Diminished bases, normal effort  CVS/Heart  regular rhythm, no rub or gallop  Abdomen:   Soft, nontender  Extremities:  trace peripheral edema  Neurologic:  Alert,  able to follow commands  Skin:  No acute rashes  Right IJ PermCath  Physical  Basic Metabolic Panel:  Recent Labs  Lab 09/16/21 1923 09/16/21 2015 09/16/21 2352 09/17/21 0600 09/18/21 0311 09/19/21 0502  NA 141  --   --  140 140 140  K >7.5*  --  4.9 4.5 5.7* 5.2*  CL 104  --   --  96* 102 100  CO2 21*  --   --  29 26 25   GLUCOSE 94  --   --  139* 92 119*  BUN 79*  --   --  41* 34* 52*  CREATININE 17.39*  --   --  11.20* 9.30* 11.18*  CALCIUM 6.7*  --   --  7.4* 7.3* 7.3*  PHOS   --  5.4*  --   --  4.7* 5.4*      CBC: Recent Labs  Lab 09/16/21 1923 09/17/21 0600  WBC 5.6 4.4  NEUTROABS 3.7  --   HGB 10.9* 9.7*  HCT 35.9* 31.7*  MCV 89.8 89.8  PLT 81* 90*       Lab Results  Component Value Date   HEPBSAG NON REACTIVE 09/16/2021   HEPBSAB Reactive (A) 09/16/2021   HEPBIGM NON REACTIVE 01/26/2020      Microbiology:  Recent Results (from the past 240 hour(s))  Blood culture (routine x 2)     Status: None (Preliminary result)   Collection Time: 09/16/21  8:15 PM   Specimen: BLOOD  Result Value Ref Range Status   Specimen Description BLOOD BLOOD LEFT FOREARM  Final   Special Requests   Final    BOTTLES DRAWN AEROBIC AND ANAEROBIC Blood Culture results may not be optimal due to an inadequate volume of blood received in culture bottles   Culture   Final    NO GROWTH 3 DAYS Performed at [facility], [address], [place], [place] [address]    Report Status PENDING  Incomplete  Culture, blood (Routine X 2) w Reflex to ID Panel     Status: None (Preliminary result)   Collection Time: 09/19/21  5:02 AM   Specimen: BLOOD RIGHT HAND  Result Value Ref Range Status   Specimen Description BLOOD RIGHT HAND  Final   Special Requests   Final    BOTTLES DRAWN AEROBIC AND ANAEROBIC Blood Culture results may not be optimal due to an excessive volume of blood received in culture bottles   Culture   Final    NO GROWTH < 12 HOURS Performed at [facility], [address] [place], [place] [address]    Report Status PENDING  Incomplete    Coagulation Studies: No results for input(s): LABPROT, INR in the last 72 hours.  Urinalysis: No results for input(s): COLORURINE, LABSPEC, PHURINE, GLUCOSEU, HGBUR, BILIRUBINUR, KETONESUR, PROTEINUR, UROBILINOGEN, NITRITE, LEUKOCYTESUR in the last 72 hours.  Invalid input(s): APPERANCEUR    Imaging: No results found.   Medications:    promethazine (PHENERGAN) injection (IM or IVPB)       calcitRIOL  0.25 mcg Oral Daily   calcium acetate  2,001 mg Oral TID with meals   calcium carbonate  1,250 mg Oral BID WC   Chlorhexidine Gluconate Cloth  6 each Topical Q0600   [START ON 09/21/2021] cloNIDine  0.1 mg Transdermal Weekly   ferric citrate  420 mg Oral TID   furosemide  60 mg Intravenous Q12H   gabapentin  600 mg Oral QHS   hydrOXYzine  25 mg Oral BID   sodium zirconium cyclosilicate  10 g Oral BID     Assessment/ Plan:  46 y.o. male with end-stage renal disease, chronic dialysis dependent, history of infected AV graft, history of hyperkalemia, GERD, chronic pain, history of bilateral foot drop from remote Guillain-[place], history of COVID-19 infection in the past, hypertension, congestive heart failure, depression was admitted on 09/16/2021 for  Principal Problem:   Fluid overload Active Problems:   ESRD on dialysis ([facility])   Hyperkalemia   Type 2 diabetes mellitus with peripheral neuropathy ([facility])   Essential hypertension   Acute midline low back pain without sciatica  Hyperkalemia [E87.5] Acute bilateral low back pain without sciatica [M54.50]  #.  Severe hyperkalemia in setting of ESRD Severe hyperkalemia due to missing dialysis on Thursday and dietary noncompliance.  Ate a big portion of watermelon and pineapple per patient report. Emergent dialysis received on admission..  -Next dialysis treatment scheduled for Tuesday. - Potassium elevated this morning 5.2.  Appears patient has been consuming high potassium foods.  Primary team plans to recheck potassium at lunch, if acceptable patient will be discharged.  Lokelma was prescribed this morning by primary team.  We will liberalize patient's diet, and assess his food choices.  #Shortness of breath and volume overload -Remains on room air.    #. Anemia of CKD  Lab Results  Component Value Date   HGB 9.7 (L) 09/17/2021  Hgb at goal Continue low dose EPO with HD if hemoglobin  #. Secondary hyperparathyroidism of  renal origin N 25.81      Component Value Date/Time   PTH 35 01/25/2020 0944   Lab Results  Component Value Date   PHOS 5.4 (H) 09/19/2021   Phosphorus at goal and calcium decreased likely due to poor oral nutrition. Continue calcium acetate and continue calcium carbonate.   #Hypocalcemia Received IV calcium gluconate as a part of treatment for hyperkalemia. -Receiving calcitriol and oral calcium+ vitamin D supplements.    LOS: 3  Colon Flattery 5/29/202312:16 PM  [place], [place]

## 2021-09-19 NOTE — Care Management Important Message (Signed)
Important Message  Patient Details  Name: Patrick Downs MRN: [id number] Date of Birth: 06-15-1975   Medicare Important Message Given:  N/A - LOS <3 / Initial given by admissions     Dannette Barbara 09/19/2021, 9:11 AM

## 2021-09-20 LAB — HEPATITIS B SURFACE ANTIBODY, QUANTITATIVE: Hep B S AB Quant (Post): 120.7 m[IU]/mL (ref >9.9)

## 2021-09-21 LAB — CULTURE, BLOOD (ROUTINE X 2): Culture: NO GROWTH

## 2021-09-24 LAB — CULTURE, BLOOD (ROUTINE X 2): Culture: NO GROWTH

## 2021-09-26 NOTE — Discharge Summary (Signed)
Physician Discharge Summary  Patient: Patrick Downs QMG:[id number] DOB: Oct 15, 1975   Code Status: Prior Admit date: 09/16/2021 Discharge date: 09/18/2021 Disposition: Home, No home health services recommended PCP: Leonel Ramsay, MD  Recommendations for Outpatient Follow-up:  Follow up with PCP within 1-2 weeks Discuss chronic back pain Diabetes management Follow up with nephrology for routine HD  Discharge Diagnoses:  Principal Problem:   Fluid overload Active Problems:   Hyperkalemia   Acute midline low back pain without sciatica   Type 2 diabetes mellitus with peripheral neuropathy ([place])   ESRD on dialysis [facility])   Essential hypertension  Brief Hospital Course Summary: Patrick Downs is a 46 y.o. male with a PMH significant for end-stage renal disease on hemodialysis on TTS, CHF, depression, GERD, gout, hypertension and peripheral neuropathy.   They presented from home to the ED on 09/16/2021 with acute worsening of chronic low back pain with generalized weakness x a few days.  He missed his most recent dialysis session.   In the ED, it was found that they had stable vital signs with mild hypertension.  Significant findings included potassium greater than 7.5, calcium 6.7, anion gap 16.  Hgb 10.9, platelets 81.  He had no active bleeding. EKG showed NSR with first-degree AV block. Chest x-ray positive for chronic central vascular congestion without overt edema.   They were initially treated with D50 in order to provide insulin, calcium gluconate, albuterol, Lokelma.  Nephrology was consulted for urgent dialysis. After urgent HD, his potassium level continued to return back to mildly elevated which was treated with lokelma on non-dialysis days.  He attributed the hyperkalemia to his diet which was high in potassium containing foods. On day of discharge, he was asymptomatic and K+ returned to 5.1  The reason he came to ED was his back pain for which he received an MRI  lumbar spine. This revealed foraminal narrowing in lumbar spine and bulging disc at S1.   Discharge Condition: Good, improved Recommended discharge diet: Renal diet  Consultations: nephrology  Procedures/Studies: HD Lumbar MRI   Allergies as of 09/19/2021       Reactions   Hepatitis B Vaccine    Other reaction(s): Unknown   Ondansetron Other (See Comments)   Stomach pain    Minoxidil Other (See Comments)   "put fluid around my heart", PERICARDIAL EFFUSION   Morphine And Related Other (See Comments)   Aggressive    Omnipaque [iohexol] Itching, Other (See Comments)   Rigors on one occasion, widespread itching on a separate occasion (resolved with Benadryl), tremors        Medication List     STOP taking these medications    brompheniramine-pseudoephedrine-DM 30-2-10 MG/5ML syrup   diphenhydrAMINE 25 mg capsule Commonly known as: BENADRYL   midodrine 5 MG tablet Commonly known as: PROAMATINE       TAKE these medications    acetaminophen 500 MG tablet Commonly known as: TYLENOL Take 1,000-1,500 mg by mouth 2 (two) times daily as needed for moderate pain or headache.   Auryxia 1 GM 210 MG(Fe) tablet Generic drug: ferric citrate Take 420 mg by mouth 3 (three) times daily.   calcium acetate 667 MG capsule Commonly known as: PHOSLO Take 2,001 mg by mouth 3 (three) times daily.   cloNIDine 0.1 mg/24hr patch Commonly known as: CATAPRES - Dosed in mg/24 hr Place 0.1 mg onto the skin once a week.   gabapentin 300 MG capsule Commonly known as: NEURONTIN Take 600 mg by  mouth at bedtime.   hydrOXYzine 25 MG tablet Commonly known as: ATARAX Take 1 tablet by mouth 2 (two) times daily.   oxyCODONE-acetaminophen 10-325 MG tablet Commonly known as: Percocet Take 1 tablet by mouth every 4 (four) hours as needed for pain. What changed: Another medication with the same name was removed. Continue taking this medication, and follow the directions you see here.    sildenafil 100 MG tablet Commonly known as: VIAGRA Take 100 mg by mouth daily as needed for erectile dysfunction.         Subjective   Pt reports feeling improved overall. States that his back pain is not being addressed and has not improved.  He endorses understanding of diet changes to decrease his potassium intake.  He will follow up with HD on regular schedule and his PCP for back pain. Objective  Blood pressure 121/71, pulse 85, temperature 98.5 F (36.9 C), temperature source Oral, resp. rate 18, height 6\' 2"  (1.88 m), weight 118.8 kg, SpO2 95 %.   General: Pt is alert, awake, not in acute distress Cardiovascular: RRR, S1/S2 +, no rubs, no gallops Respiratory: CTA bilaterally, no wheezing, no rhonchi Abdominal: Soft, NT, ND, bowel sounds + Extremities: no edema, no cyanosis   The results of significant diagnostics from this hospitalization (including imaging, microbiology, ancillary and laboratory) are listed below for reference.   Imaging studies: DG Lumbar Spine Complete  Result Date: 09/16/2021 CLINICAL DATA:  Acute on chronic lower back pain EXAM: LUMBAR SPINE - COMPLETE 4+ VIEW; SACRUM AND COCCYX - 2+ VIEW COMPARISON:  08/09/2014 FINDINGS: Lumbar spine: Frontal, bilateral oblique, lateral views of the lumbar spine are obtained. There are 5 non-rib-bearing lumbar type vertebral bodies in grossly normal anatomic alignment. There is increased sclerosis and subtle endplate irregularity involving the inferior endplate of L5 and superior endplate of S1. Mild progressive disc space narrowing at this level. While this could reflect underlying progressive spondylosis, other etiologies including dialysis related spondyloarthropathy or discitis/osteomyelitis could be considered. Remaining disc spaces are well preserved. No acute fractures. Sacrum/coccyx: Pelvic inlet, pelvic outlet, and lateral views are obtained. Please see above discussion for endplate irregularities at the L5-S1  disc space. The remainder of the sacrum and coccyx are unremarkable. Visualized portions of the bony pelvis are normal. Sacroiliac joints are symmetrical with no erosive change. IMPRESSION: 1. Increasing sclerosis and subtle endplate irregularity surrounding the L5-S1 disc space as above. This likely reflects progressive spondylosis, though dialysis related spondyloarthropathy or discitis/osteomyelitis could be considered depending on clinical presentation. If further imaging is required, MRI of the lumbar spine may be useful. 2. No acute fracture. Electronically Signed   By: Randa Ngo M.D.   On: 09/16/2021 17:33   DG Sacrum/Coccyx  Result Date: 09/16/2021 CLINICAL DATA:  Acute on chronic lower back pain EXAM: LUMBAR SPINE - COMPLETE 4+ VIEW; SACRUM AND COCCYX - 2+ VIEW COMPARISON:  08/09/2014 FINDINGS: Lumbar spine: Frontal, bilateral oblique, lateral views of the lumbar spine are obtained. There are 5 non-rib-bearing lumbar type vertebral bodies in grossly normal anatomic alignment. There is increased sclerosis and subtle endplate irregularity involving the inferior endplate of L5 and superior endplate of S1. Mild progressive disc space narrowing at this level. While this could reflect underlying progressive spondylosis, other etiologies including dialysis related spondyloarthropathy or discitis/osteomyelitis could be considered. Remaining disc spaces are well preserved. No acute fractures. Sacrum/coccyx: Pelvic inlet, pelvic outlet, and lateral views are obtained. Please see above discussion for endplate irregularities at the L5-S1 disc space. The  remainder of the sacrum and coccyx are unremarkable. Visualized portions of the bony pelvis are normal. Sacroiliac joints are symmetrical with no erosive change. IMPRESSION: 1. Increasing sclerosis and subtle endplate irregularity surrounding the L5-S1 disc space as above. This likely reflects progressive spondylosis, though dialysis related spondyloarthropathy  or discitis/osteomyelitis could be considered depending on clinical presentation. If further imaging is required, MRI of the lumbar spine may be useful. 2. No acute fracture. Electronically Signed   By: Randa Ngo M.D.   On: 09/16/2021 17:33   MR LUMBAR SPINE WO CONTRAST  Result Date: 09/17/2021 CLINICAL DATA:  Low back pain EXAM: MRI LUMBAR SPINE WITHOUT CONTRAST TECHNIQUE: Multiplanar, multisequence MR imaging of the lumbar spine was performed. No intravenous contrast was administered. COMPARISON:  10/30/2014 FINDINGS: Segmentation: 5 lumbar type vertebral bodies. Partial sacralization of L5 with bilateral L5-S1 pseudoarticulation of broadened L5 transverse processes with the sacral ala. Alignment:  Mild levocurvature.  3 mm retrolisthesis of L5 on S1. Vertebrae: Redemonstrated mottled appearance of the spine, consistent with chronic renal failure. No acute fracture or suspicious osseous lesion. Congenitally short pedicles, which narrow the AP diameter of the spinal canal. Conus medullaris and cauda equina: Conus extends to the L2 level. Conus and cauda equina appear normal. 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 Disc levels: T12-L1: No significant disc bulge. No spinal canal stenosis or neural foraminal narrowing. L1-L2: No significant disc bulge. Mild facet arthropathy. No spinal canal stenosis or neural foraminal narrowing. L2-L3: No significant disc bulge. No spinal canal stenosis or neural foraminal narrowing. L3-L4: No significant disc bulge. Mild facet arthropathy. No spinal canal stenosis or neural foraminal narrowing. L4-L5: Mild disc bulge. Mild facet arthropathy. No spinal canal stenosis. Mild right neural foraminal narrowing, which is new from the prior exam. L5-S1: Broad-based disc bulge, asymmetric to the right, which has increased in size compared to the prior exam and which contacts the descending right S1 nerves. Narrowing of the left lateral recess could also affect the  descending left S1 nerve roots. No significant spinal canal stenosis. Mild facet arthropathy. Moderate bilateral neural foraminal narrowing, which has progressed from the prior exam. IMPRESSION: 1. L5-S1 moderate bilateral neural foraminal narrowing. A right eccentric disc bulge contacts the descending right S1 nerve roots. Narrowing of the left lateral recess could also affect descending left S1 nerve roots. 2. L4-L5 mild right neural foraminal narrowing. 3. Mottled osseous marrow signal, consistent with chronic renal failure. Electronically Signed   By: Merilyn Baba M.D.   On: 09/17/2021 03:10   DG Chest Portable 1 View  Result Date: 09/16/2021 CLINICAL DATA:  Lower back pain, shortness of breath EXAM: PORTABLE CHEST 1 VIEW COMPARISON:  07/12/2021 FINDINGS: Single frontal view of the chest demonstrates right internal jugular central venous catheter tip overlying the superior vena cava. Cardiac silhouette is stable. Stable chronic central vascular congestion without airspace disease, effusion, or pneumothorax. No acute bony abnormality. IMPRESSION: 1. Chronic central vascular congestion without overt edema. Electronically Signed   By: Randa Ngo M.D.   On: 09/16/2021 19:45    Labs: Basic Metabolic Panel: Recent Labs  Lab 09/19/21 1208  NA 140  K 5.1  CL 97*  CO2 26  GLUCOSE 113*  BUN 54*  CREATININE 12.04*  CALCIUM 7.4*    Microbiology: None   Time coordinating discharge: Over 30 minutes  Richarda Osmond, MD  [facility] 09/26/2021, 7:33 AM

## 2021-09-29 ENCOUNTER — Other Ambulatory Visit
Admission: RE | Admit: 2021-09-29 | Discharge: 2021-09-29 | Disposition: A | Discharge status: Home / Self Care | Payer: Medicare Other | Source: Ambulatory Visit | Attending: Nephrology | Admitting: Nephrology

## 2021-09-29 DIAGNOSIS — E875 Hyperkalemia: Secondary | ICD-10-CM | POA: Diagnosis present

## 2021-09-29 LAB — POTASSIUM: Potassium: 4.3 mmol/L (ref 3.5–5.1)

## 2021-10-11 ENCOUNTER — Ambulatory Visit: Payer: Medicare Other | Attending: Anesthesiology | Admitting: Anesthesiology

## 2021-10-11 DIAGNOSIS — F1190 Opioid use, unspecified, uncomplicated: Secondary | ICD-10-CM | POA: Diagnosis not present

## 2021-10-11 DIAGNOSIS — M5136 Other intervertebral disc degeneration, lumbar region: Secondary | ICD-10-CM

## 2021-10-11 DIAGNOSIS — R29898 Other symptoms and signs involving the musculoskeletal system: Secondary | ICD-10-CM

## 2021-10-11 DIAGNOSIS — M47816 Spondylosis without myelopathy or radiculopathy, lumbar region: Secondary | ICD-10-CM

## 2021-10-11 DIAGNOSIS — M25552 Pain in left hip: Secondary | ICD-10-CM

## 2021-10-11 DIAGNOSIS — G894 Chronic pain syndrome: Secondary | ICD-10-CM | POA: Diagnosis not present

## 2021-10-11 DIAGNOSIS — M545 Low back pain, unspecified: Secondary | ICD-10-CM

## 2021-10-11 DIAGNOSIS — Z992 Dependence on renal dialysis: Secondary | ICD-10-CM

## 2021-10-11 DIAGNOSIS — G8929 Other chronic pain: Secondary | ICD-10-CM

## 2021-10-11 DIAGNOSIS — N186 End stage renal disease: Secondary | ICD-10-CM

## 2021-10-13 ENCOUNTER — Encounter: Payer: Self-pay | Admitting: Anesthesiology

## 2021-10-13 MED ORDER — OXYCODONE-ACETAMINOPHEN 10-325 MG PO TABS: 1.0000 | ORAL_TABLET | ORAL | 0 refills | Status: AC | PRN

## 2021-10-13 MED ORDER — OXYCODONE-ACETAMINOPHEN 10-325 MG PO TABS: 1.0000 | ORAL_TABLET | ORAL | 0 refills | Status: DC | PRN

## 2021-12-22 ENCOUNTER — Encounter: Payer: Self-pay | Admitting: Anesthesiology

## 2021-12-22 ENCOUNTER — Ambulatory Visit: Payer: Medicare Other | Attending: Anesthesiology | Admitting: Anesthesiology

## 2021-12-22 DIAGNOSIS — M5136 Other intervertebral disc degeneration, lumbar region: Secondary | ICD-10-CM | POA: Diagnosis not present

## 2021-12-22 DIAGNOSIS — M47816 Spondylosis without myelopathy or radiculopathy, lumbar region: Secondary | ICD-10-CM

## 2021-12-22 DIAGNOSIS — M25552 Pain in left hip: Secondary | ICD-10-CM

## 2021-12-22 DIAGNOSIS — N186 End stage renal disease: Secondary | ICD-10-CM

## 2021-12-22 DIAGNOSIS — F1190 Opioid use, unspecified, uncomplicated: Secondary | ICD-10-CM | POA: Diagnosis not present

## 2021-12-22 DIAGNOSIS — G894 Chronic pain syndrome: Secondary | ICD-10-CM | POA: Diagnosis not present

## 2021-12-22 DIAGNOSIS — M545 Low back pain, unspecified: Secondary | ICD-10-CM

## 2021-12-22 DIAGNOSIS — G8929 Other chronic pain: Secondary | ICD-10-CM

## 2021-12-22 DIAGNOSIS — Z992 Dependence on renal dialysis: Secondary | ICD-10-CM

## 2021-12-22 MED ORDER — OXYCODONE-ACETAMINOPHEN 10-325 MG PO TABS: 1.0000 | ORAL_TABLET | ORAL | 0 refills | Status: DC | PRN

## 2021-12-22 MED ORDER — OXYCODONE-ACETAMINOPHEN 10-325 MG PO TABS: 1.0000 | ORAL_TABLET | ORAL | 0 refills | Status: AC | PRN

## 2021-12-22 NOTE — Progress Notes (Signed)
Virtual Visit via Telephone Note  I connected with [name] on 12/22/21 at  1:45 PM EDT by telephone and verified that I am speaking with the correct person using two identifiers.  Location: Patient: Home Provider: Pain control center   I discussed the limitations, risks, security and privacy concerns of performing an evaluation and management service by telephone and the availability of in person appointments. I also discussed with the patient that there may be a patient responsible charge related to this service. The patient expressed understanding and agreed to proceed.   History of Present Illness: I spoke with Baird Lyons today via telephone as we were unable like for the video portion of the conference.  He reports that he has been doing well with his current regimen.  The quality characteristic and distribution of his low back pain are unchanged otherwise he is in his usual state of health.  He is to every 4 hours on average to work well for him.  They enable him to stay active complete his daily functional schedule and sleep well at night.  He has failed more conservative therapy but his current regimen seems to be working well with no side effects reported.  Review of systems: General: No fevers or chills Pulmonary: No shortness of breath or dyspnea Cardiac: No angina or palpitations or lightheadedness GI: No abdominal pain or constipation Psych: No depression     Observations/Objective:  Current Outpatient Medications:    [START ON 02/02/2022] oxyCODONE-acetaminophen (PERCOCET) 10-325 MG tablet, Take 1 tablet by mouth every 4 (four) hours as needed for pain., Disp: 135 tablet, Rfl: 0   acetaminophen (TYLENOL) 500 MG tablet, Take 1,000-1,500 mg by mouth 2 (two) times daily as needed for moderate pain or headache., Disp: , Rfl:    AURYXIA 1 GM 210 MG(Fe) tablet, Take 420 mg by mouth 3 (three) times daily., Disp: , Rfl:    calcium acetate (PHOSLO) 667 MG capsule, Take  2,001 mg by mouth 3 (three) times daily., Disp: , Rfl:    cloNIDine (CATAPRES - DOSED IN MG/24 HR) 0.1 mg/24hr patch, Place 0.1 mg onto the skin once a week., Disp: , Rfl:    gabapentin (NEURONTIN) 300 MG capsule, Take 600 mg by mouth at bedtime. , Disp: , Rfl:    hydrOXYzine (ATARAX) 25 MG tablet, Take 1 tablet by mouth 2 (two) times daily., Disp: , Rfl:    [START ON 01/03/2022] oxyCODONE-acetaminophen (PERCOCET) 10-325 MG tablet, Take 1 tablet by mouth every 4 (four) hours as needed for pain., Disp: 135 tablet, Rfl: 0   sildenafil (VIAGRA) 100 MG tablet, Take 100 mg by mouth daily as needed for erectile dysfunction., Disp: , Rfl:    Past Medical History:  Diagnosis Date   CHF (congestive heart failure) (HCC)    Degenerative disc disease, lumbar    Depression    Dialysis patient ([place])    ESRD (end stage renal disease) on dialysis ([place])    Failure to thrive in adult    GERD (gastroesophageal reflux disease)    Gout    Guillain Barr syndrome (HCC)    Guillain Barr syndrome ([place])    Heart murmur    Hip pain, chronic, left 12/24/2020   HTN (hypertension)    Hyperparathyroidism (HCC)    Kidney failure    MRSA (methicillin resistant staph aureus) culture positive    Peripheral neuropathy    Pneumonia    Renal insufficiency    Respiratory failure ([place])  Assessment and Plan: 1. Chronic pain syndrome   2. Chronic, continuous use of opioids   3. DDD (degenerative disc disease), lumbar   4. Facet arthritis of lumbar region   5. Chronic bilateral low back pain without sciatica   6. Hip pain, chronic, left   7. ESRD on dialysis [facility])   Based on our conversation today I think is appropriate to refill his medicines.  I have reviewed the [facility] practitioner database information and is appropriate to refill his medications for September 12 and October 12.  No other changes in his pharmacologic regimen will be initiated today.  I encouraged him to continue with exercises for  stretching strengthening for his low back.  Continue follow-up with his primary care physicians for baseline medical care with return to clinic scheduled in 2 months.  Follow Up Instructions:    I discussed the assessment and treatment plan with the patient. The patient was provided an opportunity to ask questions and all were answered. The patient agreed with the plan and demonstrated an understanding of the instructions.   The patient was advised to call back or seek an in-person evaluation if the symptoms worsen or if the condition fails to improve as anticipated.  I provided 30 minutes of non-face-to-face time during this encounter.   Molli Barrows, MD

## 2022-02-16 ENCOUNTER — Ambulatory Visit: Payer: Medicare Other | Attending: Anesthesiology | Admitting: Anesthesiology

## 2022-02-16 DIAGNOSIS — N186 End stage renal disease: Secondary | ICD-10-CM

## 2022-02-16 DIAGNOSIS — G894 Chronic pain syndrome: Secondary | ICD-10-CM | POA: Diagnosis not present

## 2022-02-16 DIAGNOSIS — Z79891 Long term (current) use of opiate analgesic: Secondary | ICD-10-CM

## 2022-02-16 DIAGNOSIS — M47816 Spondylosis without myelopathy or radiculopathy, lumbar region: Secondary | ICD-10-CM

## 2022-02-16 DIAGNOSIS — M5136 Other intervertebral disc degeneration, lumbar region: Secondary | ICD-10-CM

## 2022-02-16 DIAGNOSIS — G8929 Other chronic pain: Secondary | ICD-10-CM

## 2022-02-16 DIAGNOSIS — M25552 Pain in left hip: Secondary | ICD-10-CM

## 2022-02-16 DIAGNOSIS — Z992 Dependence on renal dialysis: Secondary | ICD-10-CM

## 2022-02-16 DIAGNOSIS — M545 Low back pain, unspecified: Secondary | ICD-10-CM

## 2022-02-16 DIAGNOSIS — R29898 Other symptoms and signs involving the musculoskeletal system: Secondary | ICD-10-CM

## 2022-02-16 DIAGNOSIS — F1190 Opioid use, unspecified, uncomplicated: Secondary | ICD-10-CM

## 2022-02-16 DIAGNOSIS — G610 Guillain-Barre syndrome: Secondary | ICD-10-CM

## 2022-02-16 MED ORDER — OXYCODONE-ACETAMINOPHEN 10-325 MG PO TABS: 1.0000 | ORAL_TABLET | ORAL | 0 refills | Status: AC | PRN

## 2022-02-16 MED ORDER — OXYCODONE-ACETAMINOPHEN 10-325 MG PO TABS: 1.0000 | ORAL_TABLET | ORAL | 0 refills | Status: DC | PRN

## 2022-02-17 ENCOUNTER — Encounter: Payer: Self-pay | Admitting: Anesthesiology

## 2022-02-17 NOTE — Progress Notes (Signed)
Virtual Visit via Telephone Note  I connected with Patrick Downs on 02/17/22 at 11:15 AM EDT by telephone and verified that I am speaking with the correct person using two identifiers.  Location: Patient: Home Provider: Pain control center   I discussed the limitations, risks, security and privacy concerns of performing an evaluation and management service by telephone and the availability of in person appointments. I also discussed with the patient that there may be a patient responsible charge related to this service. The patient expressed understanding and agreed to proceed.   History of Present Illness: I spoke with Patrick Downs via telephone as he was unable to do the video portion of the conference.  He reports that his low back pain and lower extremity pain are stable in nature.  No other changes in the quality characteristic or distribution of his pain are noted at this time.  He is taking his medications 4 times a day and reports about 75 to 80% improvement with the medicines lasting 4 to 6 hours.  No side effects with the medications are noted.  He continues to do his physical therapy exercises and stretching strengthening as tolerated.  Review of systems: General: No fevers or chills Pulmonary: No shortness of breath or dyspnea Cardiac: No angina or palpitations or lightheadedness GI: No abdominal pain or constipation Psych: No depression    Observations/Objective:  Current Outpatient Medications:    [START ON 04/03/2022] oxyCODONE-acetaminophen (PERCOCET) 10-325 MG tablet, Take 1 tablet by mouth every 4 (four) hours as needed for pain., Disp: 135 tablet, Rfl: 0   acetaminophen (TYLENOL) 500 MG tablet, Take 1,000-1,500 mg by mouth 2 (two) times daily as needed for moderate pain or headache., Disp: , Rfl:    AURYXIA 1 GM 210 MG(Fe) tablet, Take 420 mg by mouth 3 (three) times daily., Disp: , Rfl:    calcium acetate (PHOSLO) 667 MG capsule, Take 2,001 mg by mouth 3 (three)  times daily., Disp: , Rfl:    cloNIDine (CATAPRES - DOSED IN MG/24 HR) 0.1 mg/24hr patch, Place 0.1 mg onto the skin once a week., Disp: , Rfl:    gabapentin (NEURONTIN) 300 MG capsule, Take 600 mg by mouth at bedtime. , Disp: , Rfl:    hydrOXYzine (ATARAX) 25 MG tablet, Take 1 tablet by mouth 2 (two) times daily., Disp: , Rfl:    [START ON 03/04/2022] oxyCODONE-acetaminophen (PERCOCET) 10-325 MG tablet, Take 1 tablet by mouth every 4 (four) hours as needed for pain., Disp: 135 tablet, Rfl: 0   sildenafil (VIAGRA) 100 MG tablet, Take 100 mg by mouth daily as needed for erectile dysfunction., Disp: , Rfl:    Past Medical History:  Diagnosis Date   CHF (congestive heart failure) (HCC)    Degenerative disc disease, lumbar    Depression    Dialysis patient ([place])    ESRD (end stage renal disease) on dialysis ([place])    Failure to thrive in adult    GERD (gastroesophageal reflux disease)    Gout    Guillain Barr syndrome (HCC)    Guillain Barr syndrome ([place])    Heart murmur    Hip pain, chronic, left 12/24/2020   HTN (hypertension)    Hyperparathyroidism (HCC)    Kidney failure    MRSA (methicillin resistant staph aureus) culture positive    Peripheral neuropathy    Pneumonia    Renal insufficiency    Respiratory failure (HCC)     Assessment and Plan:  1. Chronic pain syndrome  2. Chronic, continuous use of opioids   3. DDD (degenerative disc disease), lumbar   4. Facet arthritis of lumbar region   5. Chronic bilateral low back pain without sciatica   6. Hip pain, chronic, left   7. ESRD on dialysis ([place])   8. Weakness of both lower extremities   9. Guillain-Barre syndrome ([place])   Based on our discussion today I think it is appropriate to continue with his current medication management.  I have reviewed the [facility] practitioner database information and it is appropriate.  Refills will be given for his medicines for the next 2 months dated for November 11 and December 11.  We  will schedule him for return to clinic in 2 months.  Continue stretching strengthening exercises as reviewed today and continue follow-up with his primary care physicians and nephrologist for routine medical care Follow Up Instructions:    I discussed the assessment and treatment plan with the patient. The patient was provided an opportunity to ask questions and all were answered. The patient agreed with the plan and demonstrated an understanding of the instructions.   The patient was advised to call back or seek an in-person evaluation if the symptoms worsen or if the condition fails to improve as anticipated.  I provided 30 minutes of non-face-to-face time during this encounter.   Molli Barrows, MD

## 2022-04-20 ENCOUNTER — Ambulatory Visit: Payer: Medicare Other | Attending: Anesthesiology | Admitting: Anesthesiology

## 2022-04-20 ENCOUNTER — Encounter: Payer: Self-pay | Admitting: Anesthesiology

## 2022-04-20 DIAGNOSIS — M5136 Other intervertebral disc degeneration, lumbar region: Secondary | ICD-10-CM | POA: Diagnosis not present

## 2022-04-20 DIAGNOSIS — G610 Guillain-Barre syndrome: Secondary | ICD-10-CM

## 2022-04-20 DIAGNOSIS — Z992 Dependence on renal dialysis: Secondary | ICD-10-CM

## 2022-04-20 DIAGNOSIS — M545 Low back pain, unspecified: Secondary | ICD-10-CM

## 2022-04-20 DIAGNOSIS — M47816 Spondylosis without myelopathy or radiculopathy, lumbar region: Secondary | ICD-10-CM

## 2022-04-20 DIAGNOSIS — N186 End stage renal disease: Secondary | ICD-10-CM

## 2022-04-20 DIAGNOSIS — F1190 Opioid use, unspecified, uncomplicated: Secondary | ICD-10-CM | POA: Diagnosis not present

## 2022-04-20 DIAGNOSIS — R29898 Other symptoms and signs involving the musculoskeletal system: Secondary | ICD-10-CM

## 2022-04-20 DIAGNOSIS — M25552 Pain in left hip: Secondary | ICD-10-CM

## 2022-04-20 DIAGNOSIS — G894 Chronic pain syndrome: Secondary | ICD-10-CM

## 2022-04-20 DIAGNOSIS — G8929 Other chronic pain: Secondary | ICD-10-CM

## 2022-04-20 MED ORDER — OXYCODONE-ACETAMINOPHEN 10-325 MG PO TABS: 1.0000 | ORAL_TABLET | ORAL | 0 refills | Status: DC | PRN

## 2022-04-20 MED ORDER — OXYCODONE-ACETAMINOPHEN 10-325 MG PO TABS: 1.0000 | ORAL_TABLET | ORAL | 0 refills | Status: AC | PRN

## 2022-04-21 NOTE — Progress Notes (Signed)
Virtual Visit via Telephone Note  I connected with Patrick Downs on 04/21/22 at  1:20 PM EST by telephone and verified that I am speaking with the correct person using two identifiers.  Location: Patient: Home Provider: Pain control center   I discussed the limitations, risks, security and privacy concerns of performing an evaluation and management service by telephone and the availability of in person appointments. I also discussed with the patient that there may be a patient responsible charge related to this service. The patient expressed understanding and agreed to proceed.   History of Present Illness:  I spoke with Patrick Downs today via telephone.  We were unable link for the video portion of the conference but he reports that he is doing well.  He still taking his medications as prescribed these continue to work well for his diffuse pain primarily affecting the low back hips buttocks and knees.  Has been on chronic opioid maintenance therapy for a long period of time but continues to derive good functional benefit from this with improved overall lifestyle and function.  They enable him to continue to work the active and sleep better at night.  No other side effects with the medications are noted.  Otherwise he reports that he is doing well.  No change in lower extremity strength function or bowel bladder function is noted. Observations/Objective:  Current Outpatient Medications:    [START ON 05/27/2022] oxyCODONE-acetaminophen (PERCOCET) 10-325 MG tablet, Take 1 tablet by mouth every 4 (four) hours as needed for pain., Disp: 135 tablet, Rfl: 0   acetaminophen (TYLENOL) 500 MG tablet, Take 1,000-1,500 mg by mouth 2 (two) times daily as needed for moderate pain or headache., Disp: , Rfl:    AURYXIA 1 GM 210 MG(Fe) tablet, Take 420 mg by mouth 3 (three) times daily., Disp: , Rfl:    calcium acetate (PHOSLO) 667 MG capsule, Take 2,001 mg by mouth 3 (three) times daily., Disp: , Rfl:    cloNIDine  (CATAPRES - DOSED IN MG/24 HR) 0.1 mg/24hr patch, Place 0.1 mg onto the skin once a week., Disp: , Rfl:    gabapentin (NEURONTIN) 300 MG capsule, Take 600 mg by mouth at bedtime. , Disp: , Rfl:    hydrOXYzine (ATARAX) 25 MG tablet, Take 1 tablet by mouth 2 (two) times daily., Disp: , Rfl:    [START ON 04/27/2022] oxyCODONE-acetaminophen (PERCOCET) 10-325 MG tablet, Take 1 tablet by mouth every 4 (four) hours as needed for pain., Disp: 135 tablet, Rfl: 0   sildenafil (VIAGRA) 100 MG tablet, Take 100 mg by mouth daily as needed for erectile dysfunction., Disp: , Rfl:    Past Medical History:  Diagnosis Date   CHF (congestive heart failure) (HCC)    Degenerative disc disease, lumbar    Depression    Dialysis patient ([place])    ESRD (end stage renal disease) on dialysis ([place])    Failure to thrive in adult    GERD (gastroesophageal reflux disease)    Gout    Guillain Barr syndrome (HCC)    Guillain Barr syndrome ([place])    Heart murmur    Hip pain, chronic, left 12/24/2020   HTN (hypertension)    Hyperparathyroidism (HCC)    Kidney failure    MRSA (methicillin resistant staph aureus) culture positive    Peripheral neuropathy    Pneumonia    Renal insufficiency    Respiratory failure (HCC)      Assessment and Plan:  1. Chronic pain syndrome   2. Chronic,  continuous use of opioids   3. DDD (degenerative disc disease), lumbar   4. Facet arthritis of lumbar region   5. Chronic bilateral low back pain without sciatica   6. Hip pain, chronic, left   7. ESRD on dialysis ([place])   8. Weakness of both lower extremities   9. Guillain-Barre syndrome [facility])   Based on our conversation and upon review of the [facility] practitioner database information I think it is appropriate to refill his medicines for January 5 February 4.  No other changes in his regimen will be initiated at this time.  Continue core stretching strengthening exercises with ambulation as tolerated with a schedule return to  clinic in 2 months.  Continue follow-up with his primary care physicians for baseline medical care. Follow Up Instructions:    I discussed the assessment and treatment plan with the patient. The patient was provided an opportunity to ask questions and all were answered. The patient agreed with the plan and demonstrated an understanding of the instructions.   The patient was advised to call back or seek an in-person evaluation if the symptoms worsen or if the condition fails to improve as anticipated.  I provided 30 minutes of non-face-to-face time during this encounter.   Molli Barrows, MD

## 2022-06-12 ENCOUNTER — Other Ambulatory Visit
Admission: RE | Admit: 2022-06-12 | Discharge: 2022-06-12 | Disposition: A | Discharge status: Home / Self Care | Payer: 59 | Attending: Anesthesiology | Admitting: Anesthesiology

## 2022-06-12 DIAGNOSIS — F1190 Opioid use, unspecified, uncomplicated: Secondary | ICD-10-CM | POA: Insufficient documentation

## 2022-06-12 DIAGNOSIS — G894 Chronic pain syndrome: Secondary | ICD-10-CM | POA: Insufficient documentation

## 2022-06-19 ENCOUNTER — Encounter: Payer: Medicare Other | Admitting: Anesthesiology

## 2022-06-19 ENCOUNTER — Ambulatory Visit: Payer: 59

## 2022-06-20 LAB — DRUG SCREEN 10 W/CONF, SERUM
Amphetamines, IA: NEGATIVE ng/mL
Barbiturates, IA: NEGATIVE ug/mL
Benzodiazepines, IA: NEGATIVE ng/mL
Cocaine & Metabolite, IA: NEGATIVE ng/mL
Methadone, IA: NEGATIVE ng/mL
Opiates, IA: NEGATIVE ng/mL
Oxycodones, IA: POSITIVE ng/mL — AB
Phencyclidine, IA: NEGATIVE ng/mL
Propoxyphene, IA: NEGATIVE ng/mL
THC(Marijuana) Metabolite, IA: NEGATIVE ng/mL

## 2022-06-20 LAB — OXYCODONES,MS,WB/SP RFX
Oxycocone: 10.4 ng/mL
Oxycodones Confirmation: POSITIVE
Oxymorphone: NEGATIVE ng/mL

## 2022-06-22 ENCOUNTER — Ambulatory Visit: Payer: 59 | Attending: Anesthesiology | Admitting: Anesthesiology

## 2022-06-22 ENCOUNTER — Encounter: Payer: Self-pay | Admitting: Anesthesiology

## 2022-06-22 VITALS — BP 91/79 | HR 110 | Temp 97.2°F | Ht 74.0 in | Wt 261.0 lb

## 2022-06-22 DIAGNOSIS — M47816 Spondylosis without myelopathy or radiculopathy, lumbar region: Secondary | ICD-10-CM

## 2022-06-22 DIAGNOSIS — R29898 Other symptoms and signs involving the musculoskeletal system: Secondary | ICD-10-CM | POA: Insufficient documentation

## 2022-06-22 DIAGNOSIS — F1190 Opioid use, unspecified, uncomplicated: Secondary | ICD-10-CM

## 2022-06-22 DIAGNOSIS — M545 Low back pain, unspecified: Secondary | ICD-10-CM | POA: Diagnosis present

## 2022-06-22 DIAGNOSIS — G894 Chronic pain syndrome: Secondary | ICD-10-CM

## 2022-06-22 DIAGNOSIS — G8929 Other chronic pain: Secondary | ICD-10-CM | POA: Diagnosis present

## 2022-06-22 DIAGNOSIS — M5136 Other intervertebral disc degeneration, lumbar region: Secondary | ICD-10-CM | POA: Diagnosis present

## 2022-06-22 DIAGNOSIS — Z992 Dependence on renal dialysis: Secondary | ICD-10-CM | POA: Diagnosis present

## 2022-06-22 DIAGNOSIS — M25552 Pain in left hip: Secondary | ICD-10-CM | POA: Insufficient documentation

## 2022-06-22 DIAGNOSIS — N186 End stage renal disease: Secondary | ICD-10-CM | POA: Insufficient documentation

## 2022-06-22 MED ORDER — OXYCODONE-ACETAMINOPHEN 10-325 MG PO TABS: 1.0000 | ORAL_TABLET | ORAL | 0 refills | Status: DC | PRN

## 2022-06-22 MED ORDER — OXYCODONE-ACETAMINOPHEN 10-325 MG PO TABS: 1.0000 | ORAL_TABLET | ORAL | 0 refills | Status: AC | PRN

## 2022-06-22 NOTE — Progress Notes (Signed)
Safety precautions to be maintained throughout the outpatient stay will include: orient to surroundings, keep bed in low position, maintain call bell within reach at all times, provide assistance with transfer out of bed and ambulation.  

## 2022-06-23 NOTE — Progress Notes (Signed)
Subjective:  Patient ID: Patrick Downs, male    DOB: [birth year]  Age: 47 y.o. MRN: [id number]  CC: Back Pain (lower)   Procedure: None  HPI Patrick Downs presents for reevaluation.  He was last seen via virtual 2 months ago and is doing well.  No significant change in the quality characteristic or distribution of his low back pain or leg pain is noted.  He occasionally has an exacerbation of his sciatica symptoms which are generally reasonably well-controlled with physical therapy.  He continues to take chronic opioids for this and these worked well for him.  He describes about a 75% reduction lasting about 4 to 6 hours when he takes his medications with recurrence of the same quality pain there after.  This current regimen is working well for him.  He currently averages about 135 tablets/month at the 10/325 strength.  No side effects reported.  No change in lower extremity strength function or bowel function is noted.  He is on chronic hemodialysis therefore kidney function is not monitored.  Outpatient Medications Prior to Visit  Medication Sig Dispense Refill   acetaminophen (TYLENOL) 500 MG tablet Take 1,000-1,500 mg by mouth 2 (two) times daily as needed for moderate pain or headache.     AURYXIA 1 GM 210 MG(Fe) tablet Take 420 mg by mouth 3 (three) times daily.     calcium acetate (PHOSLO) 667 MG capsule Take 2,001 mg by mouth 3 (three) times daily.     cloNIDine (CATAPRES - DOSED IN MG/24 HR) 0.1 mg/24hr patch Place 0.1 mg onto the skin once a week.     gabapentin (NEURONTIN) 300 MG capsule Take 600 mg by mouth at bedtime.      hydrOXYzine (ATARAX) 25 MG tablet Take 1 tablet by mouth 2 (two) times daily.     sildenafil (VIAGRA) 100 MG tablet Take 100 mg by mouth daily as needed for erectile dysfunction.     oxyCODONE-acetaminophen (PERCOCET) 10-325 MG tablet Take 1 tablet by mouth every 4 (four) hours as needed for pain. 135 tablet 0   No facility-administered medications prior to  visit.    Review of Systems CNS: No confusion or sedation Cardiac: No angina or palpitations GI: No abdominal pain or constipation Constitutional: No nausea vomiting fevers or chills  Objective:  BP 91/79   Pulse (!) 110   Temp (!) 97.2 F (36.2 C)   Ht '6\' 2"'$  (1.88 m)   Wt 261 lb (118.4 kg)   SpO2 99%   BMI 33.51 kg/m    BP Readings from Last 3 Encounters:  06/22/22 91/79  09/19/21 121/71  07/12/21 130/88     Wt Readings from Last 3 Encounters:  06/22/22 261 lb (118.4 kg)  09/19/21 261 lb 14.5 oz (118.8 kg)  07/12/21 255 lb (115.7 kg)     Physical Exam Pt is alert and oriented PERRL EOMI HEART IS RRR no murmur or rub LCTA no wheezing or rales MUSCULOSKELETAL reveals some mild paraspinous muscle tenderness but no overt trigger points.  He gets from seated to standing without difficulty but does have a mildly antalgic gait muscle tone and bulk in lower extremities remains within reason.  Labs  Lab Results  Component Value Date   HGBA1C 5.4 05/14/2020   HGBA1C 5.7 (H) 01/26/2020   HGBA1C 5.5 08/16/2017   Lab Results  Component Value Date   LDLCALC 111 (H) 01/26/2020   CREATININE 12.04 (H) 09/19/2021    -------------------------------------------------------------------------------------------------------------------- Lab Results  Component Value  Date   WBC 4.4 09/17/2021   HGB 9.7 (L) 09/17/2021   HCT 31.7 (L) 09/17/2021   PLT 90 (L) 09/17/2021   GLUCOSE 113 (H) 09/19/2021   CHOL 162 01/26/2020   TRIG 99 01/26/2020   HDL 31 (L) 01/26/2020   LDLCALC 111 (H) 01/26/2020   ALT 41 09/16/2021   AST 37 09/16/2021   NA 140 09/19/2021   K 4.3 09/29/2021   CL 97 (L) 09/19/2021   CREATININE 12.04 (H) 09/19/2021   BUN 54 (H) 09/19/2021   CO2 26 09/19/2021   TSH 2.932 08/08/2019   INR 1.2 05/14/2020   HGBA1C 5.4 05/14/2020    --------------------------------------------------------------------------------------------------------------------- No  results found.   Assessment & Plan:   Patrick Downs was seen today for back pain.  Diagnoses and all orders for this visit:  Chronic pain syndrome  Chronic, continuous use of opioids  DDD (degenerative disc disease), lumbar -     Ambulatory referral to Physical Therapy  Facet arthritis of lumbar region -     Ambulatory referral to Physical Therapy  Chronic bilateral low back pain without sciatica  Hip pain, chronic, left  ESRD on dialysis ([place])  Weakness of both lower extremities  Other orders -     oxyCODONE-acetaminophen (PERCOCET) 10-325 MG tablet; Take 1 tablet by mouth every 4 (four) hours as needed for pain. -     oxyCODONE-acetaminophen (PERCOCET) 10-325 MG tablet; Take 1 tablet by mouth every 4 (four) hours as needed for pain.        ----------------------------------------------------------------------------------------------------------------------  Problem List Items Addressed This Visit       Unprioritized   ESRD on dialysis [facility])   Hip pain, chronic, left   Relevant Medications   oxyCODONE-acetaminophen (PERCOCET) 10-325 MG tablet (Start on 06/26/2022)   oxyCODONE-acetaminophen (PERCOCET) 10-325 MG tablet (Start on 07/26/2022)   Other Visit Diagnoses     Chronic pain syndrome    -  Primary   Relevant Medications   oxyCODONE-acetaminophen (PERCOCET) 10-325 MG tablet (Start on 06/26/2022)   oxyCODONE-acetaminophen (PERCOCET) 10-325 MG tablet (Start on 07/26/2022)   Chronic, continuous use of opioids       DDD (degenerative disc disease), lumbar       Relevant Medications   oxyCODONE-acetaminophen (PERCOCET) 10-325 MG tablet (Start on 06/26/2022)   oxyCODONE-acetaminophen (PERCOCET) 10-325 MG tablet (Start on 07/26/2022)   Other Relevant Orders   Ambulatory referral to Physical Therapy   Facet arthritis of lumbar region       Relevant Medications   oxyCODONE-acetaminophen (PERCOCET) 10-325 MG tablet (Start on 06/26/2022)   oxyCODONE-acetaminophen (PERCOCET) 10-325  MG tablet (Start on 07/26/2022)   Other Relevant Orders   Ambulatory referral to Physical Therapy   Chronic bilateral low back pain without sciatica       Relevant Medications   oxyCODONE-acetaminophen (PERCOCET) 10-325 MG tablet (Start on 06/26/2022)   oxyCODONE-acetaminophen (PERCOCET) 10-325 MG tablet (Start on 07/26/2022)   Weakness of both lower extremities             ----------------------------------------------------------------------------------------------------------------------  1. Chronic pain syndrome Continue current medication management.  No other changes in his pharmacologic regimen will be initiated.  I have reviewed the [facility] practitioner database information is appropriate for refill.  We have also reviewed his most recent serum screen and it is appropriate for refill.  2. Chronic, continuous use of opioids As above  3. DDD (degenerative disc disease), lumbar Continue core stretching strengthening exercises.  We will have him continue with physical therapy and it  has been a while since he has had any formalized modality training and we will see if we can accommodate this today. - Ambulatory referral to Physical Therapy  4. Facet arthritis of lumbar region As above - Ambulatory referral to Physical Therapy  5. Chronic bilateral low back pain without sciatica Have spoken to him about possibility for epidural steroid injection however he he feels that some of his back pain was precipitated originally by a epidural injection with a previous provider unspecified.  He desires not to proceed with this.  6. Hip pain, chronic, left   7. ESRD on dialysis [facility]) Continue follow-up with his primary care physicians and nephrologist for ongoing management of primary care problems and chronic renal failure  8. Weakness of both lower  extremities     ----------------------------------------------------------------------------------------------------------------------  I am having Patrick Downs start on oxyCODONE-acetaminophen. I am also having him maintain his gabapentin, acetaminophen, Auryxia, hydrOXYzine, cloNIDine, sildenafil, calcium acetate, and oxyCODONE-acetaminophen.   Meds ordered this encounter  Medications   oxyCODONE-acetaminophen (PERCOCET) 10-325 MG tablet    Sig: Take 1 tablet by mouth every 4 (four) hours as needed for pain.    Dispense:  135 tablet    Refill:  0   oxyCODONE-acetaminophen (PERCOCET) 10-325 MG tablet    Sig: Take 1 tablet by mouth every 4 (four) hours as needed for pain.    Dispense:  135 tablet    Refill:  0   Patient's Medications  New Prescriptions   OXYCODONE-ACETAMINOPHEN (PERCOCET) 10-325 MG TABLET    Take 1 tablet by mouth every 4 (four) hours as needed for pain.  Previous Medications   ACETAMINOPHEN (TYLENOL) 500 MG TABLET    Take 1,000-1,500 mg by mouth 2 (two) times daily as needed for moderate pain or headache.   AURYXIA 1 GM 210 MG(FE) TABLET    Take 420 mg by mouth 3 (three) times daily.   CALCIUM ACETATE (PHOSLO) 667 MG CAPSULE    Take 2,001 mg by mouth 3 (three) times daily.   CLONIDINE (CATAPRES - DOSED IN MG/24 HR) 0.1 MG/24HR PATCH    Place 0.1 mg onto the skin once a week.   GABAPENTIN (NEURONTIN) 300 MG CAPSULE    Take 600 mg by mouth at bedtime.    HYDROXYZINE (ATARAX) 25 MG TABLET    Take 1 tablet by mouth 2 (two) times daily.   SILDENAFIL (VIAGRA) 100 MG TABLET    Take 100 mg by mouth daily as needed for erectile dysfunction.  Modified Medications   Modified Medication Previous Medication   OXYCODONE-ACETAMINOPHEN (PERCOCET) 10-325 MG TABLET oxyCODONE-acetaminophen (PERCOCET) 10-325 MG tablet      Take 1 tablet by mouth every 4 (four) hours as needed for pain.    Take 1 tablet by mouth every 4 (four) hours as needed for pain.  Discontinued Medications    No medications on file   ----------------------------------------------------------------------------------------------------------------------  Follow-up: Return in about 2 months (around 08/21/2022) for evaluation, med refill.    Molli Barrows, MD

## 2022-07-17 ENCOUNTER — Ambulatory Visit (INDEPENDENT_AMBULATORY_CARE_PROVIDER_SITE_OTHER): Payer: 59 | Admitting: Internal Medicine

## 2022-07-17 VITALS — BP 126/74 | HR 81 | Resp 16 | Ht 74.0 in | Wt 279.5 lb

## 2022-07-17 DIAGNOSIS — I10 Essential (primary) hypertension: Secondary | ICD-10-CM

## 2022-07-17 DIAGNOSIS — G4730 Sleep apnea, unspecified: Secondary | ICD-10-CM

## 2022-07-17 NOTE — Progress Notes (Signed)
Sleep Medicine   Office Visit  Patient Name: Patrick Downs DOB: [birth year] MRN [id number]    Chief Complaint: sleep evaluation  Brief History:  Lemond presents with a 10+ year  history of snoring and daytime fatigue.   Patient reports his sleep quality is poor due to waking tired no matter how long he sleeps. This is noted most day/nights. The patient relates the following symptoms:  gasping, snoring, excessive sleepiness during day,some brain fog, lack of focus as well as a decreased energy level are also present. Patient goes to dialysis in the morning and upon return home he goes to sleep after lunch, sleeping  until 8:00pm at night. He then doesn't go back to bed until 9:00am the next morning. He states his sleep schedule is varied and "off".  Sleep quality is better when outside home environment.  Patient has noted restlessness of his legs at night.  The patient  relates talking behavior during the night.  The patient denies a history of psychiatric problems. The Epworth Sleepiness Score is 13 out of 24 . STOP BANG score is 6. The patient relates  Cardiovascular risk factors include: hypertension, heart failure.     ROS  General: (-) fever, (-) chills, (-) night sweat Nose and Sinuses: (-) nasal stuffiness or itchiness, (-) postnasal drip, (-) nosebleeds, (-) sinus trouble. Mouth and Throat: (-) sore throat, (-) hoarseness. Neck: (-) swollen glands, (-) enlarged thyroid, (-) neck pain. Respiratory: - cough, - shortness of breath, - wheezing. Neurologic: + numbness, + tingling. Psychiatric: - anxiety, - depression Sleep behavior: -sleep paralysis -hypnogogic hallucinations -dream enactment      -vivid dreams -cataplexy -night terrors -sleep walking   Current Medication: Outpatient Encounter Medications as of 07/17/2022  Medication Sig   acetaminophen (TYLENOL) 500 MG tablet Take 1,000-1,500 mg by mouth 2 (two) times daily as needed for moderate pain or headache.   AURYXIA 1 GM  210 MG(Fe) tablet Take 420 mg by mouth 3 (three) times daily.   calcium acetate (PHOSLO) 667 MG capsule Take 2,001 mg by mouth 3 (three) times daily.   hydrOXYzine (ATARAX) 25 MG tablet Take 1 tablet by mouth 2 (two) times daily.   oxyCODONE-acetaminophen (PERCOCET) 10-325 MG tablet Take 1 tablet by mouth every 4 (four) hours as needed for pain.   [START ON 07/26/2022] oxyCODONE-acetaminophen (PERCOCET) 10-325 MG tablet Take 1 tablet by mouth every 4 (four) hours as needed for pain.   pregabalin (LYRICA) 25 MG capsule Take by mouth.   sildenafil (VIAGRA) 100 MG tablet Take 100 mg by mouth daily as needed for erectile dysfunction.   [DISCONTINUED] cloNIDine (CATAPRES - DOSED IN MG/24 HR) 0.1 mg/24hr patch Place 0.1 mg onto the skin once a week.   [DISCONTINUED] gabapentin (NEURONTIN) 300 MG capsule Take 600 mg by mouth at bedtime.    No facility-administered encounter medications on file as of 07/17/2022.    Surgical History: Past Surgical History:  Procedure Laterality Date   A/V FISTULAGRAM Right 01/07/2018   Procedure: A/V FISTULAGRAM;  Surgeon: Algernon Huxley, MD;  Location: [facility];  Service: Cardiovascular;  Laterality: Right;   AMPUTATION TOE Right 06/08/2017   Procedure: AMPUTATION TOE RIGHT FIFTH TOE;  Surgeon: Samara Deist, DPM;  Location: [facility];  Service: Podiatry;  Laterality: Right;   AMPUTATION TOE Left 05/17/2018   Procedure: RAY LEFT 5TH;  Surgeon: Samara Deist, DPM;  Location: [facility];  Service: Podiatry;  Laterality: Left;   AMPUTATION TOE Right 03/05/2020   Procedure:  AMPUTATION TOE IPJ X 2 RIGHT GREAT AND 3RD;  Surgeon: Samara Deist, DPM;  Location: [facility];  Service: Podiatry;  Laterality: Right;   AV FISTULA PLACEMENT     x5      2 graphs   AV FISTULA PLACEMENT Right 04/08/2020   Procedure: INSERTION OF ARTERIOVENOUS (AV) GORE-TEX GRAFT ARM ( ARTEGRAFT);  Surgeon: Algernon Huxley, MD;  Location: [facility];  Service: Vascular;  Laterality: Right;    DIALYSIS/PERMA CATHETER INSERTION N/A 01/20/2019   Procedure: DIALYSIS/PERMA CATHETER INSERTION;  Surgeon: Algernon Huxley, MD;  Location: [facility];  Service: Cardiovascular;  Laterality: N/A;   DIALYSIS/PERMA CATHETER REMOVAL N/A 06/17/2018   Procedure: DIALYSIS/PERMA CATHETER REMOVAL;  Surgeon: Algernon Huxley, MD;  Location: [facility];  Service: Cardiovascular;  Laterality: N/A;   PARATHYROIDECTOMY     PERIPHERAL VASCULAR THROMBECTOMY Right 01/22/2019   Procedure: PERIPHERAL VASCULAR THROMBECTOMY;  Surgeon: Algernon Huxley, MD;  Location: [facility];  Service: Cardiovascular;  Laterality: Right;   REMOVAL OF GRAFT Right 05/16/2020   Procedure: REMOVAL OF GRAFT;  Surgeon: Conrad , MD;  Location: [facility];  Service: Vascular;  Laterality: Right;   RENAL BIOPSY     REVISON OF ARTERIOVENOUS FISTULA Right 02/07/2018   Procedure: REVISON OF ARTERIOVENOUS FISTULA;  Surgeon: Algernon Huxley, MD;  Location: [facility];  Service: Vascular;  Laterality: Right;   TEE WITHOUT CARDIOVERSION N/A 01/22/2018   Procedure: TRANSESOPHAGEAL ECHOCARDIOGRAM (TEE);  Surgeon: Dionisio David, MD;  Location: [facility];  Service: Cardiovascular;  Laterality: N/A;   tonsiilectomy     TONSILLECTOMY     tracheotomy     UPPER EXTREMITY VENOGRAPHY Bilateral 02/23/2020   Procedure: UPPER EXTREMITY VENOGRAPHY;  Surgeon: Algernon Huxley, MD;  Location: [facility];  Service: Cardiovascular;  Laterality: Bilateral;    Medical History: Past Medical History:  Diagnosis Date   CHF (congestive heart failure) ([place])    Degenerative disc disease, lumbar    Depression    Dialysis patient ([place])    ESRD (end stage renal disease) on dialysis ([place])    Failure to thrive in adult    GERD (gastroesophageal reflux disease)    Gout    Guillain Barr syndrome (HCC)    Guillain Barr syndrome (HCC)    Heart murmur    Hip pain, chronic, left 12/24/2020   HTN (hypertension)    Hyperparathyroidism (HCC)     Kidney failure    MRSA (methicillin resistant staph aureus) culture positive    Peripheral neuropathy    Pneumonia    Renal insufficiency    Respiratory failure (HCC)     Family History: Non contributory to the present illness  Social History: Social History   Socioeconomic History   Marital status: Single    Spouse name: Not on file   Number of children: Not on file   Years of education: Not on file   Highest education level: Not on file  Occupational History   Not on file  Tobacco Use   Smoking status: Never   Smokeless tobacco: Never  Vaping Use   Vaping Use: Never used  Substance and Sexual Activity   Alcohol use: No   Drug use: Yes    Types: Marijuana   Sexual activity: Yes  Other Topics Concern   Not on file  Social History Narrative   Not on file   Social Determinants of Health   Financial Resource Strain: Not on file  Food  Insecurity: Not on file  Transportation Needs: Not on file  Physical Activity: Not on file  Stress: Not on file  Social Connections: Not on file  Intimate Partner Violence: Not on file    Vital Signs: Blood pressure 126/74, pulse 81, resp. rate 16, height 6\' 2"  (1.88 m), weight 279 lb 8 oz (126.8 kg), SpO2 97 %. Body mass index is 35.89 kg/m.   Examination: General Appearance: The patient is well-developed, well-nourished, and in no distress. Neck Circumference: 47cm Skin: Gross inspection of skin unremarkable. Head: normocephalic, no gross deformities. Eyes: no gross deformities noted. ENT: ears appear grossly normal Neurologic: Alert and oriented. No involuntary movements.    STOP BANG RISK ASSESSMENT S (snore) Have you been told that you snore?     YES   T (tired) Are you often tired, fatigued, or sleepy during the day?   YES  O (obstruction) Do you stop breathing, choke, or gasp during sleep? YES   P (pressure) Do you have or are you being treated for high blood pressure? NO   B (BMI) Is your body index greater  than 35 kg/m? YES   A (age) Are you 73 years old or older? NO   N (neck) Do you have a neck circumference greater than 16 inches?   YES   G (gender) Are you a male? YES   TOTAL STOP/BANG "YES" ANSWERS 6                                                               A STOP-Bang score of 2 or less is considered low risk, and a score of 5 or more is high risk for having either moderate or severe OSA. For people who score 3 or 4, doctors may need to perform further assessment to determine how likely they are to have OSA.         EPWORTH SLEEPINESS SCALE:  Scale:  (0)= no chance of dozing; (1)= slight chance of dozing; (2)= moderate chance of dozing; (3)= high chance of dozing  Chance  Situtation    Sitting and reading: 3    Watching TV: 2    Sitting Inactive in public: 0    As a passenger in car: 2      Lying down to rest: 3    Sitting and talking: 0    Sitting quielty after lunch: 2    In a car, stopped in traffic: 1   TOTAL SCORE:   13 out of 24    SLEEP STUDIES:  None available   LABS: Recent Results (from the past 2160 hour(s))  Drug Screen 10 W/Conf, Serum     Status: Abnormal   Collection Time: 06/12/22 12:45 PM  Result Value Ref Range   Amphetamines, IA Negative Cutoff:50 ng/mL   Barbiturates, IA Negative Cutoff:0.1 ug/mL   Benzodiazepines, IA Negative Cutoff:20 ng/mL   Cocaine & Metabolite, IA Negative Cutoff:25 ng/mL   Phencyclidine, IA Negative Cutoff:8 ng/mL   THC(Marijuana) Metabolite, IA Negative Cutoff:5 ng/mL   Opiates, IA Negative Cutoff:5 ng/mL   Oxycodones, IA ++POSITIVE++ (A) Cutoff:5 ng/mL   Methadone, IA Negative Cutoff:25 ng/mL   Propoxyphene, IA Negative Cutoff:50 ng/mL    Comment: (NOTE) This test was developed and its performance characteristics determined by [facility].  It  has not been cleared or approved by the Food and Drug Administration. Performed At: [facility] [place], [place]  [id number] Madelaine Etienne PhrmD [id number]   Oxycodones,MS,WB/Sp Rfx     Status: None   Collection Time: 06/12/22 12:45 PM  Result Value Ref Range   Oxycodones Confirmation Positive    Oxycocone 10.4 ng/mL   Oxymorphone Negative ng/mL    Comment: (NOTE) Expected metabolism of oxycodone class drugs: Parent Drug       Detected Metabolites -----------       -------------------- Oxycodone:        Oxymorphone Oxymorphone:      None Confirmation threshold: 1.0 ng/mL Performed At: [facility] [place], [place] [id number] Madelaine Etienne PhrmD [id number]     Radiology: No results found.  No results found.  No results found.    Assessment and Plan: Patient Active Problem List   Diagnosis Date Noted   Observed sleep apnea 07/17/2022   Acute bilateral low back pain without sciatica    Fluid overload 09/16/2021   Essential hypertension 09/16/2021   Acute midline low back pain without sciatica 09/16/2021   Hip pain, chronic, left 12/24/2020   Infection of AV graft for dialysis ([place]) 05/16/2020   Cellulitis 05/14/2020   Ulcer of great toe, right, with fat layer exposed ([place]) 02/13/2020   COVID-19 01/28/2020   Acute hypoxemic respiratory failure due to COVID-19 ([place]) 01/25/2020   Sepsis ([place]) 01/25/2020   Thrombocytopenia ([place]) 01/25/2020   Elevated troponin 01/25/2020   Peripheral neuropathy 01/25/2020   Other disorders of phosphorus metabolism 11/27/2019   Sepsis without acute organ dysfunction ([place]) 08/10/2019   Hypercalcemia 05/15/2019   Murmur 02/19/2019   GERD (gastroesophageal reflux disease) 03/01/2018   Degenerative disc disease, lumbar 01/09/2018   HTN (hypertension) 12/28/2017   Hyperkalemia 08/16/2017   Cellulitis of right toe 04/25/2017   Erectile dysfunction due to diseases classified elsewhere 05/31/2016   Awaiting organ transplant status 08/23/2015   Polyneuropathy, unspecified 08/02/2015   Anemia in ESRD (end-stage renal  disease) ([place]) 08/02/2015   Gastroenteritis 08/02/2015   Heart failure, unspecified ([place]) 08/02/2015   Secondary hyperparathyroidism of renal origin ([place]) 08/02/2015   Bacterial infection, unspecified 06/29/2015   Unspecified open wound of right upper arm, initial encounter 06/29/2015   Encounter for fitting of gastrointestinal device 06/04/2015   Other specified local infections of the skin and subcutaneous tissue 05/20/2015   Idiopathic chronic hypotension 12/11/2014   Rectal pain 11/27/2014   Other specified diseases of anus and rectum 11/27/2014   Unspecified protein-calorie malnutrition ([place]) 10/08/2014   Encounter for imaging study to confirm nasogastric (NG) tube placement    Encounter for nasogastric (NG) tube placement    Respiratory failure, acute ([place])    Guillain Barr syndrome, lower ext weakness 08/27/2014   CAP (community acquired pneumonia) 08/23/2014   ESRD on dialysis ([place]) 08/23/2014   A-V fistula ([place]) 08/23/2014   ARDS (adult respiratory distress syndrome) ([place]) 08/23/2014   Septic shock ([place]) 08/23/2014   Hypotension 08/23/2014   Acute respiratory failure ([place]) 08/23/2014   Fever    Type 2 diabetes mellitus ([place]) 07/01/2014   Type 2 diabetes mellitus with peripheral neuropathy ([place]) 07/01/2014   Headache, unspecified 05/23/2014   Pruritus, unspecified 05/23/2014   Shortness of breath 05/23/2014   Iron deficiency anemia, unspecified [id number]   Complication of vascular dialysis catheter 01/08/2014   Diarrhea, unspecified 01/08/2014   Pain, unspecified 01/08/2014  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, implants and grafts, sequela 01/08/2014   Anxiety disorder, unspecified 12/28/2013   Photophobia 12/11/2013   Spells 10/30/2013   Dizziness 10/30/2013   Other disorders of electrolyte and fluid balance, not elsewhere classified 09/16/2013   Other ulcerative colitis without complications ([place]) [id number]   Coagulation defect, unspecified ([place])  04/21/2013   Other allergy, initial encounter 04/21/2013   Other mechanical complication of surgically created arteriovenous fistula, initial encounter ([place]) 02/19/2013   Foot drop 12/09/2012   History of Guillain-Barre syndrome 12/09/2012   Other specified disorders of parathyroid gland ([place]) 05/15/2012   Dependence on renal dialysis ([place]) 11/29/2010   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 Observed sleep apnea PLAN OSA:   Patient evaluation suggests high risk of sleep disordered breathing due to observed apnea, snoring, daytime somnolence, frequent awakenings, gasping.  Patient has comorbid cardiovascular risk factors including: hypertension and heart failure which could be exacerbated by pathologic sleep-disordered breathing.  Suggest: PSG to assess/treat the patient's sleep disordered breathing. The patient was also counselled on weight loss to optimize sleep health.   2. Essential hypertension Hypertension Counseling:   The following hypertensive lifestyle modification were recommended and discussed:  1. Limiting alcohol intake to less than 1 oz/day of ethanol:(24 oz of beer or 8 oz of wine or 2 oz of 100-proof whiskey). 2. Take baby ASA 81 mg daily. 3. Importance of regular aerobic exercise and losing weight. 4. Reduce dietary saturated fat and cholesterol intake for overall cardiovascular health. 5. Maintaining adequate dietary potassium, calcium, and magnesium intake. 6. Regular monitoring of the blood pressure. 7. Reduce sodium intake to less than 100 mmol/day (less than 2.3 gm of sodium or less than 6 gm of sodium choride)       General Counseling: I have discussed the findings of the evaluation and examination with Legrand Como.  I have also discussed any further diagnostic evaluation thatmay be needed or ordered today. Lorain verbalizes understanding of the findings of todays visit. We also reviewed his medications  today and discussed drug interactions and side effects including but not limited excessive drowsiness and altered mental states. We also discussed that there is always a risk not just to him but also people around him. he has been encouraged to call the office with any questions or concerns that should arise related to todays visit.  No orders of the defined types were placed in this encounter.       I have personally obtained a history, evaluated the patient, evaluated pertinent data, formulated the assessment and plan and placed orders.   This patient was seen today by Tressie Ellis, PA-C in collaboration with Dr. Devona Konig.   Allyne Gee, MD [facility] Diplomate ABMS Pulmonary and Critical Care Medicine Sleep medicine

## 2022-07-22 ENCOUNTER — Emergency Department: Payer: 59

## 2022-07-22 ENCOUNTER — Emergency Department
Admission: EM | Admit: 2022-07-22 | Discharge: 2022-07-23 | Disposition: A | Discharge status: Home / Self Care | Payer: 59 | Attending: Emergency Medicine | Admitting: Emergency Medicine

## 2022-07-22 ENCOUNTER — Other Ambulatory Visit: Payer: Self-pay

## 2022-07-22 DIAGNOSIS — X58XXXA Exposure to other specified factors, initial encounter: Secondary | ICD-10-CM | POA: Diagnosis not present

## 2022-07-22 DIAGNOSIS — M79651 Pain in right thigh: Secondary | ICD-10-CM | POA: Diagnosis present

## 2022-07-22 DIAGNOSIS — S76301A Unspecified injury of muscle, fascia and tendon of the posterior muscle group at thigh level, right thigh, initial encounter: Secondary | ICD-10-CM | POA: Insufficient documentation

## 2022-07-22 MED ORDER — OXYCODONE-ACETAMINOPHEN 5-325 MG PO TABS
1.0000 | ORAL_TABLET | Freq: Once | ORAL | Status: AC
  Administered 2022-07-22: 1 via ORAL
  Filled 2022-07-22: qty 1

## 2022-07-22 NOTE — ED Notes (Signed)
Report given to Tom RN.

## 2022-07-22 NOTE — ED Notes (Signed)
Pt to xray

## 2022-07-22 NOTE — ED Triage Notes (Signed)
Pt to ED from home via POV for a possible pulled muscle in the back of his right thigh. Pt was playing kickball when he felt a pop in the back of his right thigh. Pt has no burning sensation. Pt unable to ambulate and unable to fully extend but is comfortable in 90* angle. Pt is CAOx4 and in no acute distress, wheelchair in triage.

## 2022-07-22 NOTE — ED Provider Notes (Signed)
[facility] Provider Note  Patient Contact: 8:35 PM (approximate)   History   Leg Pain (RIGHT - thigh region)   HPI  Patrick Downs is a 47 y.o. male who presents the emergency department complaining of sudden sharp pain to the posterior thigh over his hamstring.  Patient was playing kickball, was attempting to make it to first base when he felt a sudden sharp pain in the back of his thigh.  Since then he has been unable to straighten his leg and cannot bear weight.  Patient has a history of DM Aris Lot, has to wear bilateral braces to both legs to be able to ambulate.  Currently patient is unable to stand or ambulate.  No other injury or complaint.  Patient states that when he had others move his legs he feels like the "muscle is disconnected" in the back of his thigh.  No other complaints at this time.     Physical Exam   Triage Vital Signs: ED Triage Vitals  Enc Vitals Group     BP 07/22/22 1915 (!) 101/52     Pulse Rate 07/22/22 1915 98     Resp 07/22/22 1915 18     Temp 07/22/22 1915 98.4 F (36.9 C)     Temp Source 07/22/22 1915 Oral     SpO2 07/22/22 1915 98 %     Weight 07/22/22 1915 267 lb 10.2 oz (121.4 kg)     Height 07/22/22 1915 6\' 2"  (1.88 m)     Head Circumference --      Peak Flow --      Pain Score 07/22/22 1921 10     Pain Loc --      Pain Edu? --      Excl. in [place]? --     Most recent vital signs: Vitals:   07/22/22 1915  BP: (!) 101/52  Pulse: 98  Resp: 18  Temp: 98.4 F (36.9 C)  SpO2: 98%     General: Alert and in no acute distress.  Cardiovascular:  Good peripheral perfusion Respiratory: Normal respiratory effort without tachypnea or retractions. Lungs CTAB.  Musculoskeletal: Full range of motion to all extremities.  Visualization of the thigh reveals no gross edema or significant ecchymosis currently.  Tender significantly in the mid femur region.  Does feel that this area is more lax when compared with the left side.   Also palpation distally appears that tendons are less taut than on the left side in the same position for the leg.  Pulses sensation intact distally. Neurologic:  No gross focal neurologic deficits are appreciated.  Skin:   No rash noted Other:   ED Results / Procedures / Treatments   Labs (all labs ordered are listed, but only abnormal results are displayed) Labs Reviewed - No data to display   EKG     RADIOLOGY  I personally viewed, evaluated, and interpreted these images as part of my medical decision making, as well as reviewing the written report by the radiologist.  ED Provider Interpretation: X-ray reveals no acute osseous abnormality.  Patient has MRI performed.  There is no musculoskeletal radiologist at this time and I visualized the images myself.  It does appear that there is a component of tear within the semimembranous/semitendinous region.  This matches the mid thigh pain that patient is experiencing.    DG Femur Min 2 Views Right  Result Date: 07/22/2022 CLINICAL DATA:  Sudden sharp pain in the right thigh EXAM: RIGHT  FEMUR 2 VIEWS COMPARISON:  Or abdominal radiography 01/21/2018 FINDINGS: No evidence of acute fracture. Chronic benign sclerotic focus in the femoral neck. Newly seen lucency probably within the acetabulum, suggests degenerative change of the hip joint. Incidental note is made of arterial and vas deferens calcification. IMPRESSION: No acute fracture. Newly seen lucency probably within the acetabulum suggesting degenerative change of the hip joint. Chronic benign sclerotic focus in the femoral neck. Electronically Signed   By: Nelson Chimes M.D.   On: 07/22/2022 21:16    PROCEDURES:  Critical Care performed: No  Procedures   MEDICATIONS ORDERED IN ED: Medications  oxyCODONE-acetaminophen (PERCOCET/ROXICET) 5-325 MG per tablet 1 tablet (1 tablet Oral Given 07/22/22 2223)     IMPRESSION / MDM / ASSESSMENT AND PLAN / ED COURSE  I reviewed the triage  vital signs and the nursing notes.                                 Differential diagnosis includes, but is not limited to, hamstring tear, hamstring ligament rupture, hip dislocation, femur fracture   Patient's presentation is most consistent with acute presentation with potential threat to life or bodily function.   Patient's diagnosis is consistent with hamstring injury.  Patient presents emergency department sudden sharp pain in his mid thigh region.  Patient has findings concerning for hamstring injury on physical exam.  X-ray revealed no acute osseous abnormality.  MRI was ordered, there is no musculoskeletal radiologist to read the MRI tonight but I looked at the images.  It appears that patient has a component of strain/tear in the semimembranous/somatic tendinous region.  It does appear that this is mid muscle.  Given the patient's history of Guillain-Barr with bracing I reached out to on-call orthopedic surgeon to ensure that there was no changes in typical management for this.  Orthopedics advises to lightly compress with Ace bandage over the thigh, using partial nonweightbearing technique and follow-up as an outpatient.  Patient is already on current pain management contract and I recommend reaching out to them to see if he can increase his dosing over the next several days.  Concerning signs and symptoms and return precautions discussed with the patient.  Otherwise follow-up with orthopedics..  Patient is given ED precautions to return to the ED for any worsening or new symptoms.     FINAL CLINICAL IMPRESSION(S) / ED DIAGNOSES   Final diagnoses:  Right hamstring injury, initial encounter     Rx / DC Orders   ED Discharge Orders     None        Note:  This document was prepared using Dragon voice recognition software and may include unintentional dictation errors.   Brynda Peon 07/22/22 2310    Blake Divine, MD 07/23/22 1126

## 2022-07-23 DIAGNOSIS — S76301A Unspecified injury of muscle, fascia and tendon of the posterior muscle group at thigh level, right thigh, initial encounter: Secondary | ICD-10-CM | POA: Diagnosis not present

## 2022-07-23 MED ORDER — LIDOCAINE 5 % EX PTCH
1.0000 | MEDICATED_PATCH | Freq: Once | CUTANEOUS | Status: DC
  Administered 2022-07-23: 1 via TRANSDERMAL
  Filled 2022-07-23: qty 1

## 2022-07-31 ENCOUNTER — Other Ambulatory Visit: Payer: Self-pay

## 2022-07-31 ENCOUNTER — Observation Stay
Admission: EM | Admit: 2022-07-31 | Discharge: 2022-08-01 | Disposition: A | Discharge status: Still In Hospital | Payer: 59 | Attending: Internal Medicine | Admitting: Internal Medicine

## 2022-07-31 ENCOUNTER — Emergency Department: Payer: 59

## 2022-07-31 DIAGNOSIS — E8770 Fluid overload, unspecified: Secondary | ICD-10-CM | POA: Diagnosis present

## 2022-07-31 DIAGNOSIS — R531 Weakness: Secondary | ICD-10-CM | POA: Diagnosis not present

## 2022-07-31 DIAGNOSIS — Z992 Dependence on renal dialysis: Secondary | ICD-10-CM | POA: Diagnosis not present

## 2022-07-31 DIAGNOSIS — D759 Disease of blood and blood-forming organs, unspecified: Secondary | ICD-10-CM

## 2022-07-31 DIAGNOSIS — J810 Acute pulmonary edema: Secondary | ICD-10-CM | POA: Diagnosis not present

## 2022-07-31 DIAGNOSIS — N186 End stage renal disease: Secondary | ICD-10-CM | POA: Diagnosis not present

## 2022-07-31 DIAGNOSIS — R0602 Shortness of breath: Secondary | ICD-10-CM | POA: Diagnosis present

## 2022-07-31 DIAGNOSIS — R791 Abnormal coagulation profile: Secondary | ICD-10-CM | POA: Diagnosis not present

## 2022-07-31 DIAGNOSIS — R7989 Other specified abnormal findings of blood chemistry: Secondary | ICD-10-CM

## 2022-07-31 DIAGNOSIS — E875 Hyperkalemia: Secondary | ICD-10-CM | POA: Diagnosis not present

## 2022-07-31 DIAGNOSIS — I472 Ventricular tachycardia, unspecified: Secondary | ICD-10-CM | POA: Insufficient documentation

## 2022-07-31 LAB — BASIC METABOLIC PANEL
Anion gap: 14 (ref 5–15)
BUN: 67 mg/dL — ABNORMAL HIGH (ref 6–20)
CO2: 14 mmol/L — ABNORMAL LOW (ref 22–32)
Calcium: 4.3 mg/dL — CL (ref 8.9–10.3)
Chloride: 115 mmol/L — ABNORMAL HIGH (ref 98–111)
Creatinine, Ser: 14.35 mg/dL — ABNORMAL HIGH (ref 0.61–1.24)
Glucose, Bld: 62 mg/dL — ABNORMAL LOW (ref 70–99)
Potassium: 6.7 mmol/L (ref 3.5–5.1)
Sodium: 143 mmol/L (ref 135–145)

## 2022-07-31 LAB — CBG MONITORING, ED
Glucose-Capillary: 107 mg/dL — ABNORMAL HIGH (ref 70–99)
Glucose-Capillary: 123 mg/dL — ABNORMAL HIGH (ref 70–99)
Glucose-Capillary: 143 mg/dL — ABNORMAL HIGH (ref 70–99)
Glucose-Capillary: 75 mg/dL (ref 70–99)

## 2022-07-31 LAB — CBC
HCT: 37.3 % — ABNORMAL LOW (ref 39.0–52.0)
Hemoglobin: 11.4 g/dL — ABNORMAL LOW (ref 13.0–17.0)
MCH: 29.5 pg (ref 26.0–34.0)
MCHC: 30.6 g/dL (ref 30.0–36.0)
MCV: 96.4 fL (ref 80.0–100.0)
Platelets: 130 10*3/uL — ABNORMAL LOW (ref 150–400)
RBC: 3.87 MIL/uL — ABNORMAL LOW (ref 4.22–5.81)
RDW: 14.8 % (ref 11.5–15.5)
WBC: 7.1 10*3/uL (ref 4.0–10.5)
nRBC: 0 % (ref 0.0–0.2)

## 2022-07-31 LAB — BLOOD GAS, VENOUS
Acid-Base Excess: 1.2 mmol/L (ref 0.0–2.0)
Acid-base deficit: 7.6 mmol/L — ABNORMAL HIGH (ref 0.0–2.0)
Bicarbonate: 19.7 mmol/L — ABNORMAL LOW (ref 20.0–28.0)
Bicarbonate: 29 mmol/L — ABNORMAL HIGH (ref 20.0–28.0)
O2 Saturation: 68.1 %
O2 Saturation: 59.7 %
Patient temperature: 37
Patient temperature: 37
pCO2, Ven: 46 mmHg (ref 44–60)
pCO2, Ven: 59 mmHg (ref 44–60)
pH, Ven: 7.24 — ABNORMAL LOW (ref 7.25–7.43)
pH, Ven: 7.3 (ref 7.25–7.43)
pO2, Ven: 48 mmHg — ABNORMAL HIGH (ref 32–45)
pO2, Ven: 41 mmHg (ref 32–45)

## 2022-07-31 LAB — TROPONIN I (HIGH SENSITIVITY)
Troponin I (High Sensitivity): 7 ng/L (ref <18)
Troponin I (High Sensitivity): 19 ng/L — ABNORMAL HIGH (ref <18)

## 2022-07-31 LAB — POTASSIUM
Potassium: 6.3 mmol/L (ref 3.5–5.1)
Potassium: 6.1 mmol/L — ABNORMAL HIGH (ref 3.5–5.1)

## 2022-07-31 MED ORDER — CALCIUM GLUCONATE 10 % IV SOLN
INTRAVENOUS | Status: AC
  Filled 2022-07-31: qty 10

## 2022-07-31 MED ORDER — INSULIN ASPART 100 UNIT/ML IJ SOLN
5.0000 [IU] | Freq: Once | INTRAMUSCULAR | Status: AC
  Administered 2022-07-31: 5 [IU] via INTRAVENOUS
  Filled 2022-07-31: qty 1

## 2022-07-31 MED ORDER — SODIUM BICARBONATE 8.4 % IV SOLN
INTRAVENOUS | Status: AC | PRN
  Administered 2022-07-31: 50 meq via INTRAVENOUS

## 2022-07-31 MED ORDER — CALCIUM GLUCONATE-NACL 1-0.675 GM/50ML-% IV SOLN
1.0000 g | Freq: Once | INTRAVENOUS | Status: AC
  Administered 2022-07-31: 1000 mg via INTRAVENOUS
  Filled 2022-07-31: qty 50

## 2022-07-31 MED ORDER — ACETAMINOPHEN 325 MG PO TABS: 650.0000 mg | ORAL_TABLET | ORAL | Status: DC | PRN

## 2022-07-31 MED ORDER — SODIUM BICARBONATE 8.4 % IV SOLN
INTRAVENOUS | Status: AC
  Administered 2022-07-31: 50 meq via INTRAVENOUS
  Filled 2022-07-31: qty 50

## 2022-07-31 MED ORDER — LIDOCAINE HCL (PF) 1 % IJ SOLN: 5.0000 mL | INTRAMUSCULAR | Status: DC | PRN

## 2022-07-31 MED ORDER — DEXTROSE 50 % IV SOLN
INTRAVENOUS | Status: AC
  Administered 2022-07-31: 50 mL via INTRAVENOUS
  Filled 2022-07-31: qty 50

## 2022-07-31 MED ORDER — CALCIUM GLUCONATE 10 % IV SOLN
1.0000 g | Freq: Once | INTRAVENOUS | Status: AC
  Administered 2022-07-31: 1 g via INTRAVENOUS

## 2022-07-31 MED ORDER — OXYCODONE-ACETAMINOPHEN 5-325 MG PO TABS
1.0000 | ORAL_TABLET | ORAL | Status: DC | PRN
  Administered 2022-08-01: 1 via ORAL
  Filled 2022-07-31: qty 1

## 2022-07-31 MED ORDER — CALCIUM GLUCONATE 10 % IV SOLN
1.0000 g | Freq: Once | INTRAVENOUS | Status: AC
  Administered 2022-07-31: 1 g via INTRAVENOUS
  Filled 2022-07-31: qty 10

## 2022-07-31 MED ORDER — CALCIUM ACETATE (PHOS BINDER) 667 MG PO CAPS
2001.0000 mg | ORAL_CAPSULE | Freq: Three times a day (TID) | ORAL | Status: DC
  Filled 2022-07-31 (×3): qty 3

## 2022-07-31 MED ORDER — SODIUM ZIRCONIUM CYCLOSILICATE 10 G PO PACK
10.0000 g | PACK | ORAL | Status: AC
  Administered 2022-08-01: 10 g via ORAL
  Filled 2022-07-31: qty 1

## 2022-07-31 MED ORDER — CHLORHEXIDINE GLUCONATE CLOTH 2 % EX PADS
6.0000 | MEDICATED_PAD | Freq: Every day | CUTANEOUS | Status: DC
  Filled 2022-07-31: qty 6

## 2022-07-31 MED ORDER — LIDOCAINE-PRILOCAINE 2.5-2.5 % EX CREA: 1.0000 | TOPICAL_CREAM | CUTANEOUS | Status: DC | PRN

## 2022-07-31 MED ORDER — DEXTROSE 50 % IV SOLN: 1.0000 | Freq: Once | INTRAVENOUS | Status: AC

## 2022-07-31 MED ORDER — INSULIN ASPART 100 UNIT/ML IV SOLN
5.0000 [IU] | Freq: Once | INTRAVENOUS | Status: AC
  Administered 2022-07-31: 5 [IU] via INTRAVENOUS
  Filled 2022-07-31: qty 0.05

## 2022-07-31 MED ORDER — OXYCODONE-ACETAMINOPHEN 10-325 MG PO TABS: 1.0000 | ORAL_TABLET | ORAL | Status: DC | PRN

## 2022-07-31 MED ORDER — SODIUM BICARBONATE 8.4 % IV SOLN: 50.0000 meq | Freq: Once | INTRAVENOUS | Status: AC

## 2022-07-31 MED ORDER — DEXTROSE 50 % IV SOLN
INTRAVENOUS | Status: AC
  Filled 2022-07-31: qty 50

## 2022-07-31 MED ORDER — HEPARIN SODIUM (PORCINE) 5000 UNIT/ML IJ SOLN
5000.0000 [IU] | Freq: Three times a day (TID) | INTRAMUSCULAR | Status: DC
  Administered 2022-08-01: 5000 [IU] via SUBCUTANEOUS
  Filled 2022-07-31: qty 1

## 2022-07-31 MED ORDER — SODIUM BICARBONATE 8.4 % IV SOLN
INTRAVENOUS | Status: AC
  Filled 2022-07-31: qty 50

## 2022-07-31 MED ORDER — DEXTROSE 50 % IV SOLN
50.0000 mL | Freq: Once | INTRAVENOUS | Status: AC
  Administered 2022-07-31: 50 mL via INTRAVENOUS

## 2022-07-31 MED ORDER — ANTICOAGULANT SODIUM CITRATE 4% (200MG/5ML) IV SOLN: 5.0000 mL | Status: DC | PRN

## 2022-07-31 MED ORDER — SODIUM CHLORIDE 0.9% FLUSH
3.0000 mL | Freq: Two times a day (BID) | INTRAVENOUS | Status: DC
  Administered 2022-08-01: 3 mL via INTRAVENOUS

## 2022-07-31 MED ORDER — PENTAFLUOROPROP-TETRAFLUOROETH EX AERO: 1.0000 | INHALATION_SPRAY | CUTANEOUS | Status: DC | PRN

## 2022-07-31 MED ORDER — CALCIUM CHLORIDE 10 % IV SOLN
INTRAVENOUS | Status: AC
  Filled 2022-07-31: qty 10

## 2022-07-31 MED ORDER — HEPARIN SODIUM (PORCINE) 1000 UNIT/ML DIALYSIS
1000.0000 [IU] | INTRAMUSCULAR | Status: DC | PRN
  Administered 2022-08-01: 1000 [IU]
  Filled 2022-07-31: qty 1

## 2022-07-31 MED ORDER — OXYCODONE HCL 5 MG PO TABS
5.0000 mg | ORAL_TABLET | ORAL | Status: DC | PRN
  Administered 2022-08-01: 5 mg via ORAL
  Filled 2022-07-31: qty 1

## 2022-07-31 MED ORDER — ALTEPLASE 2 MG IJ SOLR: 2.0000 mg | Freq: Once | INTRAMUSCULAR | Status: DC | PRN

## 2022-07-31 NOTE — ED Notes (Signed)
CBG: 107 Dr. Fuller Plan at bedside and acknowledged result.

## 2022-07-31 NOTE — ED Notes (Signed)
This RN to bedside d/t patient calling out, "Help!" Pt noted to have BiPAP mask off and gasping. ED cardiac monitor shows patient in persistent Vtach. MD and additional staff called to room, code cart to bedside. RT called. Pt became unresponsive, synchronized cardioversion performed, and pt lost pulses. 3 compressions performed and patient rapidly awoke, asking immediately what happened.

## 2022-07-31 NOTE — Assessment & Plan Note (Addendum)
Complicated by cardiac arrhythmia.  At this time persistent.  At this time we do not have a posttreatment potassium level therefore I have requested a full basic metabolic panel.  We will treat the patient accordingly in the meantime I will give the patient empiric Kayexalate.  Low potassium diet, continue with telemetry

## 2022-07-31 NOTE — ED Notes (Signed)
Pt C/O feeling heaviness in BUE and is beginning to get more lethargic as he did when he first came in. Dr. Fuller Plan notified. Verbal order for additional 1g calcium gluconate IVP.

## 2022-07-31 NOTE — Assessment & Plan Note (Signed)
This diagnosis is based on ER note, see Dr. Joycelyn Rua at ER assessment and plan.  At this time V. tach is felt to be due to complications of patient's significant hyperkalemia.  I will trend the troponin regardless.  Patient has narrow QRS in the last EKG and peaked T waves have resolved.  Echo ordered for a.m.

## 2022-07-31 NOTE — Assessment & Plan Note (Signed)
This is likely due to patient's significant hyperkalemia and has resolved.

## 2022-07-31 NOTE — Assessment & Plan Note (Signed)
Check phosphate, check PTH.  Patient is s/p dialysis, repeat level.  Replete as needed.

## 2022-07-31 NOTE — H&P (Signed)
History and Physical    Patient: Patrick Downs TDV:[id number] DOB: Jan 04, 1976 DOA: 07/31/2022 DOS: the patient was seen and examined on 07/31/2022 PCP: Mick Sell, MD  Patient coming from: Home  Chief Complaint:  Chief Complaint  Patient presents with   Shortness of Breath   HPI: Patrick Downs is a 47 y.o. male with medical history significant of end-stage renal disease on dialysis.  Patient reports that he has been sleeping late last several days and due to that has missed at least 2 of his dialysis sessions.  Meanwhile patient has had increasing trouble breathing as well as generalized weakness.  Generalized weakness was first noticed yesterday and was so bad today that the patient was unable to walk independently.  Patient does not report any nausea vomiting diarrhea fever coughing leg swelling or cramping.  Patient came to the ER primarily because of his generalized weakness.  Although he also reports significant contribution of shortness of breath.  Patient has been in the ER approximately 7 hours already.  Patient was found to be hyperkalemic, hypocalcemic on initial evaluation.  Patient had a run of ventricular tachycardia for which she was cardioverted and received to chest compressions before waking up.  Patient corroborates losing consciousness in the ER.  Patient has since then received full dialysis session that was extended because of persistent hyperkalemia.  At this time patient reports all resolution of his sensation of shortness of breath as well as weakness.  He feels "great ".  Medical evaluation is sought Review of Systems: As mentioned in the history of present illness. All other systems reviewed and are negative. Past Medical History:  Diagnosis Date   CHF (congestive heart failure)    Degenerative disc disease, lumbar    Depression    Dialysis patient    ESRD (end stage renal disease) on dialysis    Failure to thrive in adult    GERD (gastroesophageal  reflux disease)    Gout    Guillain Barr syndrome    Guillain Barr syndrome    Heart murmur    Hip pain, chronic, left 12/24/2020   HTN (hypertension)    Hyperparathyroidism    Kidney failure    MRSA (methicillin resistant staph aureus) culture positive    Peripheral neuropathy    Pneumonia    Renal insufficiency    Respiratory failure    Past Surgical History:  Procedure Laterality Date   A/V FISTULAGRAM Right 01/07/2018   Procedure: A/V FISTULAGRAM;  Surgeon: Annice Needy, MD;  Location: [facility];  Service: Cardiovascular;  Laterality: Right;   AMPUTATION TOE Right 06/08/2017   Procedure: AMPUTATION TOE RIGHT FIFTH TOE;  Surgeon: Gwyneth Revels, DPM;  Location: [facility];  Service: Podiatry;  Laterality: Right;   AMPUTATION TOE Left 05/17/2018   Procedure: RAY LEFT 5TH;  Surgeon: Gwyneth Revels, DPM;  Location: [facility];  Service: Podiatry;  Laterality: Left;   AMPUTATION TOE Right 03/05/2020   Procedure: AMPUTATION TOE IPJ X 2 RIGHT GREAT AND 3RD;  Surgeon: Gwyneth Revels, DPM;  Location: [facility];  Service: Podiatry;  Laterality: Right;   AV FISTULA PLACEMENT     x5      2 graphs   AV FISTULA PLACEMENT Right 04/08/2020   Procedure: INSERTION OF ARTERIOVENOUS (AV) GORE-TEX GRAFT ARM ( ARTEGRAFT);  Surgeon: Annice Needy, MD;  Location: [facility];  Service: Vascular;  Laterality: Right;   DIALYSIS/PERMA CATHETER INSERTION N/A 01/20/2019   Procedure: DIALYSIS/PERMA CATHETER INSERTION;  Surgeon:  Annice Needyew, Jason S, MD;  Location: [facility];  Service: Cardiovascular;  Laterality: N/A;   DIALYSIS/PERMA CATHETER REMOVAL N/A 06/17/2018   Procedure: DIALYSIS/PERMA CATHETER REMOVAL;  Surgeon: Annice Needyew, Jason S, MD;  Location: [facility];  Service: Cardiovascular;  Laterality: N/A;   PARATHYROIDECTOMY     PERIPHERAL VASCULAR THROMBECTOMY Right 01/22/2019   Procedure: PERIPHERAL VASCULAR THROMBECTOMY;  Surgeon: Annice Needyew, Jason S, MD;  Location: [facility];  Service:  Cardiovascular;  Laterality: Right;   REMOVAL OF GRAFT Right 05/16/2020   Procedure: REMOVAL OF GRAFT;  Surgeon: Fransisco Hertzhen, Brian L, MD;  Location: [facility];  Service: Vascular;  Laterality: Right;   RENAL BIOPSY     REVISON OF ARTERIOVENOUS FISTULA Right 02/07/2018   Procedure: REVISON OF ARTERIOVENOUS FISTULA;  Surgeon: Annice Needyew, Jason S, MD;  Location: [facility];  Service: Vascular;  Laterality: Right;   TEE WITHOUT CARDIOVERSION N/A 01/22/2018   Procedure: TRANSESOPHAGEAL ECHOCARDIOGRAM (TEE);  Surgeon: Laurier NancyKhan, Shaukat A, MD;  Location: [facility];  Service: Cardiovascular;  Laterality: N/A;   tonsiilectomy     TONSILLECTOMY     tracheotomy     UPPER EXTREMITY VENOGRAPHY Bilateral 02/23/2020   Procedure: UPPER EXTREMITY VENOGRAPHY;  Surgeon: Annice Needyew, Jason S, MD;  Location: [facility];  Service: Cardiovascular;  Laterality: Bilateral;   Social History:  reports that he has never smoked. He has never used smokeless tobacco. He reports current drug use. Drug: Marijuana. He reports that he does not drink alcohol.  Allergies  Allergen Reactions   Hepatitis B Vaccine     Other reaction(s): Unknown   Ondansetron Other (See Comments)    Stomach pain    Minoxidil Other (See Comments)    "put fluid around my heart", PERICARDIAL EFFUSION   Morphine And Related Other (See Comments)    Aggressive    Omnipaque [Iohexol] Itching and Other (See Comments)    Rigors on one occasion, widespread itching on a separate occasion (resolved with Benadryl), tremors    Family History  Problem Relation Age of Onset   Diabetes Mellitus II Father    Kidney disease Father    Kidney failure Paternal Grandfather    Prostate cancer Neg Hx    Kidney cancer Neg Hx    Bladder Cancer Neg Hx     Prior to Admission medications   Medication Sig Start Date End Date Taking? Authorizing Provider  acetaminophen (TYLENOL) 500 MG tablet Take 1,000-1,500 mg by mouth 2 (two) times daily as needed for moderate pain or headache.     [provider]  AURYXIA 1 GM 210 MG(Fe) tablet Take 420 mg by mouth 3 (three) times daily. 11/28/19   [provider]  calcium acetate (PHOSLO) 667 MG capsule Take 2,001 mg by mouth 3 (three) times daily. 08/20/21   [provider]  hydrOXYzine (ATARAX) 25 MG tablet Take 1 tablet by mouth 2 (two) times daily. 12/02/20   [provider]  oxyCODONE-acetaminophen (PERCOCET) 10-325 MG tablet Take 1 tablet by mouth every 4 (four) hours as needed for pain. 07/26/22 08/25/22  Yevette EdwardsAdams, James G, MD  pregabalin (LYRICA) 25 MG capsule Take by mouth.    [provider]  sildenafil (VIAGRA) 100 MG tablet Take 100 mg by mouth daily as needed for erectile dysfunction. 08/20/21   [provider]    Physical Exam: Vitals:   07/31/22 2115 07/31/22 2130 07/31/22 2145 07/31/22 2200  BP: 100/81 114/81 134/79 135/82  Pulse: 82 83 82 91  Resp:  Temp:   98.6 F (37 C)   TempSrc:   Oral   SpO2: 97% 98% 98% 95%  Height:       General: Reasonably well built man, no distress, having crackers, mom at bedside Respiratory exam: Bilateral air entry vesicular Cardiovascular exam S1-S2 normal Abdomen soft nontender Extremities warm without edema Neurologically alert awake oriented x 3 no focal motor deficit antigravity strength in all 4 extremities Data Reviewed:  Labs on Admission:  Results for orders placed or performed during the hospital encounter of 07/31/22 (from the past 24 hour(s))  CBC     Status: Abnormal   Collection Time: 07/31/22  2:23 PM  Result Value Ref Range   WBC 7.1 4.0 - 10.5 K/uL   RBC 3.87 (L) 4.22 - 5.81 MIL/uL   Hemoglobin 11.4 (L) 13.0 - 17.0 g/dL   HCT 09.8 (L) 11.9 - 14.7 %   MCV 96.4 80.0 - 100.0 fL   MCH 29.5 26.0 - 34.0 pg   MCHC 30.6 30.0 - 36.0 g/dL   RDW 82.9 56.2 - 13.0 %   Platelets 130 (L) 150 - 400 K/uL   nRBC 0.0 0.0 - 0.2 %  Blood gas, venous     Status: Abnormal   Collection Time: 07/31/22  3:30 PM  Result Value Ref  Range   pH, Ven 7.24 (L) 7.25 - 7.43   pCO2, Ven 46 44 - 60 mmHg   pO2, Ven 48 (H) 32 - 45 mmHg   Bicarbonate 19.7 (L) 20.0 - 28.0 mmol/L   Acid-base deficit 7.6 (H) 0.0 - 2.0 mmol/L   O2 Saturation 68.1 %   Patient temperature 37.0    Collection site VEIN   Troponin I (High Sensitivity)     Status: None   Collection Time: 07/31/22  3:30 PM  Result Value Ref Range   Troponin I (High Sensitivity) 7 <18 ng/L  Basic metabolic panel     Status: Abnormal   Collection Time: 07/31/22  3:30 PM  Result Value Ref Range   Sodium 143 135 - 145 mmol/L   Potassium 6.7 (HH) 3.5 - 5.1 mmol/L   Chloride 115 (H) 98 - 111 mmol/L   CO2 14 (L) 22 - 32 mmol/L   Glucose, Bld 62 (L) 70 - 99 mg/dL   BUN 67 (H) 6 - 20 mg/dL   Creatinine, Ser 86.57 (H) 0.61 - 1.24 mg/dL   Calcium 4.3 (LL) 8.9 - 10.3 mg/dL   GFR, Estimated NOT CALCULATED >60 mL/min   GFR calc Af Amer NOT CALCULATED >60 mL/min   Anion gap 14 5 - 15  CBG monitoring, ED     Status: Abnormal   Collection Time: 07/31/22  3:51 PM  Result Value Ref Range   Glucose-Capillary 107 (H) 70 - 99 mg/dL  CBG monitoring, ED     Status: Abnormal   Collection Time: 07/31/22  5:13 PM  Result Value Ref Range   Glucose-Capillary 123 (H) 70 - 99 mg/dL  CBG monitoring, ED     Status: Abnormal   Collection Time: 07/31/22  5:33 PM  Result Value Ref Range   Glucose-Capillary 143 (H) 70 - 99 mg/dL  Potassium     Status: Abnormal   Collection Time: 07/31/22  6:55 PM  Result Value Ref Range   Potassium 6.3 (HH) 3.5 - 5.1 mmol/L  CBG monitoring, ED     Status: None   Collection Time: 07/31/22  7:58 PM  Result Value Ref Range   Glucose-Capillary 75 70 -  99 mg/dL  Blood gas, venous     Status: Abnormal   Collection Time: 07/31/22  8:00 PM  Result Value Ref Range   pH, Ven 7.3 7.25 - 7.43   pCO2, Ven 59 44 - 60 mmHg   pO2, Ven 41 32 - 45 mmHg   Bicarbonate 29.0 (H) 20.0 - 28.0 mmol/L   Acid-Base Excess 1.2 0.0 - 2.0 mmol/L   O2 Saturation 59.7 %    Patient temperature 37.0    Collection site VENOUS   Troponin I (High Sensitivity)     Status: Abnormal   Collection Time: 07/31/22  8:23 PM  Result Value Ref Range   Troponin I (High Sensitivity) 19 (H) <18 ng/L  Potassium     Status: Abnormal   Collection Time: 07/31/22  8:42 PM  Result Value Ref Range   Potassium 6.1 (H) 3.5 - 5.1 mmol/L   DG Chest 2 View  Result Date: 07/31/2022 CLINICAL DATA:  Chest pain.  Shortness of breath. EXAM: CHEST - 2 VIEW COMPARISON:  Chest radiograph 09/16/2021 FINDINGS: A right jugular catheter remains in place, terminating over the high right atrium. A vascular stent/graft is noted in the left axillary region. The cardiac silhouette is borderline enlarged. The lungs are hypoinflated with chronic elevation of the right hemidiaphragm. Mild central pulmonary vascular congestion is present. No overt pulmonary edema, airspace consolidation, sizeable pleural effusion, or pneumothorax is identified. No acute osseous abnormality is seen. IMPRESSION: Low lung volumes with mild central pulmonary vascular congestion. Electronically Signed   By: Sebastian Ache M.D.   On: 07/31/2022 15:03    EKG: Independently reviewed. 1738. NSR Peaked T waves in EKG on presentation.   Assessment and Plan: * Hyperkalemia Complicated by cardiac arrhythmia.  At this time persistent.  At this time we do not have a posttreatment potassium level therefore I have requested a full basic metabolic panel.  We will treat the patient accordingly in the meantime I will give the patient empiric Kayexalate.  Low potassium diet, continue with telemetry  Fluid overload This diagnosis is based on signout from the ER attending, at this time I feel that the fluid overload has largely resolved due to dialysis treatment.  ESRD on dialysis [facility]) Patient has dialysis access in the right internal jugular via her tunneled hemodialysis catheter.  Patient actually needs to go back to outpatient vascular access  clinic to have sutures removed.  Regardless per my discussion with ER attending and dialysis nurse at the bedside patient will get repeat dialysis in the morning.  Troponin I above reference range In the setting of recent cardioversion as well as ventricular tachycardia from hyperkalemia.  Will trend troponin, check echo in the morning.  Patient's blood pressure is too low to start beta-blocker right now.  Acute coronary syndrome is not suspected as patient is not having any chest pain or EKG changes post dialysis  Cytopenia Check B12 folate reticulocyte iron haptoglobin  Weakness This is likely due to patient's significant hyperkalemia and has resolved.  V-tach This diagnosis is based on ER note, see Dr. Joycelyn Rua at ER assessment and plan.  At this time V. tach is felt to be due to complications of patient's significant hyperkalemia.  I will trend the troponin regardless.  Patient has narrow QRS in the last EKG and peaked T waves have resolved.  Echo ordered for a.m.  Hypocalcemia Check phosphate, check PTH.  Patient is s/p dialysis, repeat level.  Replete as needed.      Advance  Care Planning:   Code Status: Full Code full code  Consults: Nephrology on board  Family Communication: Mother at bedside  Severity of Illness: The appropriate patient status for this patient is INPATIENT. Inpatient status is judged to be reasonable and necessary in order to provide the required intensity of service to ensure the patient's safety. The patient's presenting symptoms, physical exam findings, and initial radiographic and laboratory data in the context of their chronic comorbidities is felt to place them at high risk for further clinical deterioration. Furthermore, it is not anticipated that the patient will be medically stable for discharge from the hospital within 2 midnights of admission.   * I certify that at the point of admission it is my clinical judgment that the patient will require inpatient  hospital care spanning beyond 2 midnights from the point of admission due to high intensity of service, high risk for further deterioration and high frequency of surveillance required.*  Author: Nolberto Hanlon, MD 07/31/2022 10:47 PM  For on call review [email or web address].

## 2022-07-31 NOTE — Assessment & Plan Note (Signed)
Patient has dialysis access in the right internal jugular via her tunneled hemodialysis catheter.  Patient actually needs to go back to outpatient vascular access clinic to have sutures removed.  Regardless per my discussion with ER attending and dialysis nurse at the bedside patient will get repeat dialysis in the morning.

## 2022-07-31 NOTE — ED Notes (Signed)
During report for this patient, he was noted to be extremely somnolent, and difficult to arouse.  Once aroused patient found it difficult to orient to his current situation, but remembered in a short while.  Patient is currently undergoing dialysis, with dialysis nurse at bedside.  After speaking briefly with the patient, he closed his eyes and was in a deeps sleep within seconds.  Patient is on a non-rebreather mask, and all vital signs on the monitor at the time of report appeared stable.

## 2022-07-31 NOTE — ED Notes (Signed)
Pt now c/o L chest pain. Repeat EKG obtained. MD notified.

## 2022-07-31 NOTE — ED Triage Notes (Addendum)
Pt to ED via [facility] from home. Pt called EMS due to SOB. Pt reports missed Tu/Sa of dialysis tx. Pt reports increased SOB and generalized weakness. Pt labored in triage and stating he can't hold his head up. Pt RA sats 89%. Pt placed on 2L [place] Pt reports he thinks his K+ is high.   EMS VS:  96% RA HR 70 CBG 101 BP 177/90

## 2022-07-31 NOTE — ED Provider Notes (Signed)
[facility] Provider Note    Event Date/Time   First MD Initiated Contact with Patient 07/31/22 1502     (approximate)   History   Shortness of Breath   HPI  Patrick Downs is a 47 y.o. male  who comes in from EMS with SOB. Pt missed dialysis on Tuesday and Saturday. Increasing SOB and generalized weakness. Pt sats 89% on RA and placed on 2L.  Patient reports that he did get dialysis on Thursday.  He states that he missed dialysis because he slept in.  He denies any new falls other than when he was here on 3/30 but states he did not hit his head he is not on any blood thinners.  He denies any chest pain just reports feeling short of breath being concerned that his potassium is high.  He reports having this previously.  He denies any abdominal pain.  He reports some generalized weakness related to missing dialysis.  He reports having this previously.  He denies taking any extra of his medications.  He is on chronic opioids for pain.      Physical Exam   Triage Vital Signs: ED Triage Vitals  Enc Vitals Group     BP 07/31/22 1421 (!) 173/124     Pulse Rate 07/31/22 1421 75     Resp 07/31/22 1421 (!) 26     Temp 07/31/22 1421 98 F (36.7 C)     Temp src --      SpO2 07/31/22 1417 (!) 89 %     Weight --      Height 07/31/22 1417 6\' 2"  (1.88 m)     Head Circumference --      Peak Flow --      Pain Score 07/31/22 1417 0     Pain Loc --      Pain Edu? --      Excl. in GC? --     Most recent vital signs: Vitals:   07/31/22 1417 07/31/22 1421  BP:  (!) 173/124  Pulse:  75  Resp:  (!) 26  Temp:  98 F (36.7 C)  SpO2: (!) 89% 98%     General: Awake, no distress.  CV:  Good peripheral perfusion.  Resp:  Normal effort.  Abd:  No distention.  Other:  Patient has dialysis catheter noted in right chest wall.  He has increased work of breathing bilaterally with tight air exchange. Legs equal in size.  No calf tenderness Prior trach mark noted on  the neck AO x3- moving both arms and legs well. Hematoma or trauma noted to the head ED Results / Procedures / Treatments   Labs (all labs ordered are listed, but only abnormal results are displayed) Labs Reviewed  CBC - Abnormal; Notable for the following components:      Result Value   RBC 3.87 (*)    Hemoglobin 11.4 (*)    HCT 37.3 (*)    Platelets 130 (*)    All other components within normal limits  BASIC METABOLIC PANEL  TROPONIN I (HIGH SENSITIVITY)     EKG  My interpretation of EKG:  Normal sinus rate of 79 without any ST elevation, no T wave versions other than aVL but peaked T waves noted throughout, widened QRS  RADIOLOGY I have reviewed the xray personally and interpretted and no PNA  PROCEDURES:  Critical Care performed: Yes, see critical care procedure note(s)  .1-3 Lead EKG Interpretation  Performed by: Fuller Plan,  Alben SpittleMary E, MD Authorized by: Concha SeFunke, Ebrima Ranta E, MD     Interpretation: normal     ECG rate:  75   ECG rate assessment: normal     Rhythm: sinus rhythm     Ectopy: none     Conduction: normal   .Critical Care  Performed by: Concha SeFunke, Deondrick Searls E, MD Authorized by: Concha SeFunke, Zamari Vea E, MD   Critical care provider statement:    Critical care time (minutes):  80   Critical care was necessary to treat or prevent imminent or life-threatening deterioration of the following conditions:  Renal failure   Critical care was time spent personally by me on the following activities:  Development of treatment plan with patient or surrogate, discussions with consultants, evaluation of patient's response to treatment, examination of patient, ordering and review of laboratory studies, ordering and review of radiographic studies, ordering and performing treatments and interventions, pulse oximetry, re-evaluation of patient's condition and review of old charts    MEDICATIONS ORDERED IN ED: Medications  calcium gluconate inj 10% (1 g) URGENT USE ONLY! (1 g Intravenous Given 07/31/22  1518)     IMPRESSION / MDM / ASSESSMENT AND PLAN / ED COURSE  I reviewed the triage vital signs and the nursing notes.   Patient's presentation is most consistent with acute presentation with potential threat to life or bodily function.   Patient comes in with difficulty breathing in the setting of missed dialysis.  Patient initially on 2 L but with transferring over patient became significantly more dyspneic on 6 L with increased work of breathing therefore patient was placed on BiPAP.  This is concerning for acute respiratory failure secondary to fluid overload.  Denies any chest pain to suggest ACS.  Low suspicion for PE.  No unilateral leg swelling.  I am concerned about the possibility of hyperkalemia causing peaked T waves.  We got a call from lab saying that they want to repeat the potassium given the concern that it was falsely elevated which I suspect is actually a true reading I will give 1 g of calcium to help stabilize membrane given my concern for some peaked T waves..  I did alert the nephrology team the patient will need dialysis.  We are working on trying to figure out where patient can get dialysis at while we work on trying to get labs back for potassium.  CBC shows stable hemoglobin VBG low PH Glucose 107  Pt was in V- tach with a pulse.  Patient required synchronized cardioversion 200 J we initially not able to feel a pulse so compressions were started but patient immediately woke up a nonrebreather was placed after some brief bagging.  Patient was given additional dose of calcium, bicarb, insulin and dextrose.  Patient's repeat EKG shows significantly less peaked T waves and decreased QRS.  Discussed the case with Dr. Tim LairKassa but fortunately no ICU beds.  Recommend dialysis in the room.  I read discussed with nephrology Dr. Thedore MinsSingh.  We have additional calcium at bedside in case patient's QRS starts to widen out again.  Recommend try to reassess patient after dialysis to see if  patient can be admitted to stepdown rather than ICU  4:26 PM re-evaluated pt doing better no issues at the time- pt mom is at bedside. QRS remains narrow.   4:50 PM reports not feeling well again.  Repeat EKG appears sinus with a rate of 85 without any ST elevation, T wave version lead I but T waves do appear more  peaked QRS still seems narrow may be slightly elongating.  I am concerned that patient may be leading back into another V. tach.  Will give another dose of calcium gluconate and bicarb.  I have called lab as well as the nurse about the issues getting his labs back and they are working on trying to get them back.  I have called nephrology and let them know about the need for him having urgent dialysis in the room  5:47 PM repeat EKG is sinus rate of 79 without any ST elevation or T wave inversions, intervals are reassuring at this time.  Nephrology was at bedside and they have given an additional dose of calcium gluconate. he cardiac monitor to evaluate for evidence of arrhythmia and/or significant heart rate changes.  Repeat labs show a calcium of 4.3 BUN of 67 and bicarb of 14 and a potassium of 6.7 which I suspect is driving all of this.  6:05 PM evaluated patient.  He easily wakes to voice alert and oriented x 3 moving all extremities well but when he is sleeping he definitely has some snoring noted.  He reports having a history of potential sleep apnea and is getting worked up further for it suspect that is causing his breathing to look little bit more labored when he is asleep.  Otherwise he is tolerating dialysis well at this time continue to closely monitor him.  QRS remains narrow and calcium infusion is going in   7:56 PM has an hour left of dialysis.  He is still easily woken alert and oriented and moving all extremities.  9:22 PM patient has completed dialysis he is awake and alert and oriented x 4 we discussed CT imaging he reports feeling at his baseline self denies any falls  hitting his head.  He states he typically gets very sleepy during dialysis and he goes into a deep sleep when he gets dialysis.  He denies any headaches or confusion he remains alert and oriented he is requesting something to eat is been taken off nonrebreather and placed on 3 L looking much more comfortable.  Given he is now appearing much more stable and has not had any additional V. tach will discuss with the hospital team for admission to stepdown  FINAL CLINICAL IMPRESSION(S) / ED DIAGNOSES   Final diagnoses:  ESRD (end stage renal disease)  Hyperkalemia  Acute pulmonary edema  V tach     Rx / DC Orders   ED Discharge Orders     None        Note:  This document was prepared using Dragon voice recognition software and may include unintentional dictation errors.   Concha Se, MD 07/31/22 2123

## 2022-07-31 NOTE — ED Notes (Signed)
Pt remains on dialysis treatment with dialysis RN at bedside. Tolerating well. Resting with eyes closed and with NRB on.

## 2022-07-31 NOTE — Progress Notes (Addendum)
Hemodialysis Note  Received patient in bed to unit. Alert and oriented. Informed consent signed and in chart.   Treatment initiated:1747 Treatment completed:2146  Patient tolerated treatment well. Transported back to the room alert, without acute distress. Report given to patient's RN.  Access used: RIJ Catheter  Access issues:None   Total UF removed:3000 ml/ 3 liters Medications given:None   Post HD VS: Stable Post HD weight:Unable to obtain on ED stretcher   Patient tolerating treatment well, off non rebreather prior to end of treatment, on 3 liters of O2 via Alpha. Patient engaged in conversation, requesting food, voices no concerns, K+ remains elevated, plan to have dialysis on first shift in morning.   Bartolo Darter [facility] Nephrologist notified of K+ level, continued on 1K acid bath, patient tolerating treatment well, vital signs stable.

## 2022-07-31 NOTE — ED Notes (Signed)
Called lab to inquire about status of BMP sent down earlier. Will result shortly.

## 2022-07-31 NOTE — Assessment & Plan Note (Signed)
Check B12 folate reticulocyte iron haptoglobin

## 2022-07-31 NOTE — ED Notes (Signed)
Patient in vtach.  200j synchronized cardioversion done.  Converted to SR.  Initially paitnet with no pulse, three compressions done and patient woke up.  AAOx3.

## 2022-07-31 NOTE — ED Notes (Signed)
Attempted for 22g at L ac.  

## 2022-07-31 NOTE — Progress Notes (Signed)
[facility] [place], [place] 07/31/22  Subjective:   LOS: 0  Patient presented to the emergency room via EMS from home.  He called EMS because of shortness of breath.  He missed dialysis on Tuesday and Saturday due to oversleeping.  Did not reschedule his treatments.  He was noted to be hypoxic and required oxygen supplementation. In the ER, he was evaluated and was found to have severely elevated potassium of 6.7 and low calcium of 4.3.  Creatinine high at 14.3. Last HD was on Thursday. Nephrology consult requested by ER for emergent hemodialysis. In the ER, patient had an episode of V. tach.  Required 200 J synchronized cardioversion and converted to sinus rhythm.  Per ER notes, he required 3 compressions and he did lose pulse.  During later evaluation, he was alert, oriented and able to follow commands.  He is not able to move his legs very well and requires prosthesis. He has received several rounds of IV calcium gluconate, insulin, D50 and sodium bicarbonate.    Objective:  Vital signs in last 24 hours:  Temp:  [96.9 F (36.1 C)-98 F (36.7 C)] 96.9 F (36.1 C) (04/08 1825) Pulse Rate:  [71-96] 87 (04/08 1815) Resp:  [8-26] 8 (04/08 1815) BP: (125-173)/(37-124) 143/95 (04/08 1815) SpO2:  [89 %-100 %] 99 % (04/08 1815) FiO2 (%):  [40 %] 40 % (04/08 1530)  Weight change:  Filed Weights    Intake/Output:    Intake/Output Summary (Last 24 hours) at 07/31/2022 1831 Last data filed at 07/31/2022 1826 Gross per 24 hour  Intake 100 ml  Output --  Net 100 ml     Physical Exam: General: Chronically ill-appearing, laying in the bed  HEENT Anicteric, moist oral mucous membranes  Pulm/lungs Mild basilar crackles, requiring nonrebreather oxygen  CVS/Heart Regular, tachycardic  Abdomen:  Soft, nontender, nondistended  Extremities: Trace peripheral edema  Neurologic: Alert, able to follow commands  Skin: Warm, dry  Access: Right IJ PermCath       Basic  Metabolic Panel:  Recent Labs  Lab 07/31/22 1530  NA 143  K 6.7*  CL 115*  CO2 14*  GLUCOSE 62*  BUN 67*  CREATININE 14.35*  CALCIUM 4.3*     CBC: Recent Labs  Lab 07/31/22 1423  WBC 7.1  HGB 11.4*  HCT 37.3*  MCV 96.4  PLT 130*      Lab Results  Component Value Date   HEPBSAG NON REACTIVE 09/16/2021   HEPBSAB Reactive (A) 09/16/2021   HEPBIGM NON REACTIVE 01/26/2020      Microbiology:  No results found for this or any previous visit (from the past 240 hour(s)).  Coagulation Studies: No results for input(s): "LABPROT", "INR" in the last 72 hours.  Urinalysis: No results for input(s): "COLORURINE", "LABSPEC", "PHURINE", "GLUCOSEU", "HGBUR", "BILIRUBINUR", "KETONESUR", "PROTEINUR", "UROBILINOGEN", "NITRITE", "LEUKOCYTESUR" in the last 72 hours.  Invalid input(s): "APPERANCEUR"    Imaging: DG Chest 2 View  Result Date: 07/31/2022 CLINICAL DATA:  Chest pain.  Shortness of breath. EXAM: CHEST - 2 VIEW COMPARISON:  Chest radiograph 09/16/2021 FINDINGS: A right jugular catheter remains in place, terminating over the high right atrium. A vascular stent/graft is noted in the left axillary region. The cardiac silhouette is borderline enlarged. The lungs are hypoinflated with chronic elevation of the right hemidiaphragm. Mild central pulmonary vascular congestion is present. No overt pulmonary edema, airspace consolidation, sizeable pleural effusion, or pneumothorax is identified. No acute osseous abnormality is seen. IMPRESSION: Low lung volumes with mild  central pulmonary vascular congestion. Electronically Signed   By: Sebastian Ache M.D.   On: 07/31/2022 15:03     Medications:    anticoagulant sodium citrate      [START ON 08/01/2022] Chlorhexidine Gluconate Cloth  6 each Topical Q0600   alteplase, anticoagulant sodium citrate, heparin, lidocaine (PF), lidocaine-prilocaine, pentafluoroprop-tetrafluoroeth  Assessment/ Plan:  47 y.o. male with end-stage renal  disease on hemodialysis TTS, CHF, depression, GERD, gout, hypertension, severe peripheral neuropathy was admitted on 07/31/2022 for  Active Problems:   * No active hospital problems. *  SOB  #.  Severe hyperkalemia and shortness of breath in the setting of ESRD -Emergent hemodialysis ordered.  Dialysis treatment coordinated with staff. -1K bath.  Check potassium every hour. -Initial ultrafiltration goal 3 L.  Can be adjusted based on how the treatment is going. -Reevaluate renal panel in a.m.  #. Anemia of CKD  Lab Results  Component Value Date   HGB 11.4 (L) 07/31/2022   Low dose EPO with HD  #. Secondary hyperparathyroidism of renal origin N 25.81      Component Value Date/Time   PTH 35 01/25/2020 0944   Lab Results  Component Value Date   PHOS 5.4 (H) 09/19/2021   Monitor calcium and phos level during this admission   #.  Hypertension Likely due to volume overload. Reassess after volume removal.   LOS: 0 Patrick Downs 4/8/20246:31 PM  [facility] [place], [place] [phone]   Critical care about 50 minutes.  Care coordinated with ER, dialysis staff.

## 2022-07-31 NOTE — Assessment & Plan Note (Signed)
In the setting of recent cardioversion as well as ventricular tachycardia from hyperkalemia.  Will trend troponin, check echo in the morning.  Patient's blood pressure is too low to start beta-blocker right now.  Acute coronary syndrome is not suspected as patient is not having any chest pain or EKG changes post dialysis

## 2022-07-31 NOTE — ED Notes (Signed)
Dialysis RN at bedside.

## 2022-07-31 NOTE — Assessment & Plan Note (Signed)
This diagnosis is based on signout from the ER attending, at this time I feel that the fluid overload has largely resolved due to dialysis treatment.

## 2022-08-01 DIAGNOSIS — E875 Hyperkalemia: Secondary | ICD-10-CM | POA: Diagnosis not present

## 2022-08-01 DIAGNOSIS — J810 Acute pulmonary edema: Secondary | ICD-10-CM | POA: Diagnosis not present

## 2022-08-01 LAB — RENAL FUNCTION PANEL
Albumin: 4.1 g/dL (ref 3.5–5.0)
Anion gap: 14 (ref 5–15)
BUN: 60 mg/dL — ABNORMAL HIGH (ref 6–20)
CO2: 25 mmol/L (ref 22–32)
Calcium: 6.8 mg/dL — ABNORMAL LOW (ref 8.9–10.3)
Chloride: 99 mmol/L (ref 98–111)
Creatinine, Ser: 14.45 mg/dL — ABNORMAL HIGH (ref 0.61–1.24)
GFR, Estimated: 4 mL/min — ABNORMAL LOW (ref >60)
Glucose, Bld: 98 mg/dL (ref 70–99)
Phosphorus: 7.3 mg/dL — ABNORMAL HIGH (ref 2.5–4.6)
Potassium: 5.4 mmol/L — ABNORMAL HIGH (ref 3.5–5.1)
Sodium: 138 mmol/L (ref 135–145)

## 2022-08-01 LAB — FERRITIN: Ferritin: 1096 ng/mL — ABNORMAL HIGH (ref 24–336)

## 2022-08-01 LAB — BASIC METABOLIC PANEL
Anion gap: 15 (ref 5–15)
BUN: 55 mg/dL — ABNORMAL HIGH (ref 6–20)
CO2: 25 mmol/L (ref 22–32)
Calcium: 7.4 mg/dL — ABNORMAL LOW (ref 8.9–10.3)
Chloride: 96 mmol/L — ABNORMAL LOW (ref 98–111)
Creatinine, Ser: 14.44 mg/dL — ABNORMAL HIGH (ref 0.61–1.24)
GFR, Estimated: 4 mL/min — ABNORMAL LOW (ref >60)
Glucose, Bld: 109 mg/dL — ABNORMAL HIGH (ref 70–99)
Potassium: 4.9 mmol/L (ref 3.5–5.1)
Sodium: 136 mmol/L (ref 135–145)

## 2022-08-01 LAB — CBC
HCT: 33.4 % — ABNORMAL LOW (ref 39.0–52.0)
HCT: 34.1 % — ABNORMAL LOW (ref 39.0–52.0)
Hemoglobin: 10.4 g/dL — ABNORMAL LOW (ref 13.0–17.0)
Hemoglobin: 10.6 g/dL — ABNORMAL LOW (ref 13.0–17.0)
MCH: 29.3 pg (ref 26.0–34.0)
MCH: 29.5 pg (ref 26.0–34.0)
MCHC: 31.1 g/dL (ref 30.0–36.0)
MCHC: 31.1 g/dL (ref 30.0–36.0)
MCV: 94.2 fL (ref 80.0–100.0)
MCV: 94.6 fL (ref 80.0–100.0)
Platelets: 120 10*3/uL — ABNORMAL LOW (ref 150–400)
Platelets: 130 10*3/uL — ABNORMAL LOW (ref 150–400)
RBC: 3.53 MIL/uL — ABNORMAL LOW (ref 4.22–5.81)
RBC: 3.62 MIL/uL — ABNORMAL LOW (ref 4.22–5.81)
RDW: 14.6 % (ref 11.5–15.5)
RDW: 15 % (ref 11.5–15.5)
WBC: 5.3 10*3/uL (ref 4.0–10.5)
WBC: 6 10*3/uL (ref 4.0–10.5)
nRBC: 0 % (ref 0.0–0.2)
nRBC: 0 % (ref 0.0–0.2)

## 2022-08-01 LAB — PHOSPHORUS: Phosphorus: 6.8 mg/dL — ABNORMAL HIGH (ref 2.5–4.6)

## 2022-08-01 LAB — RETICULOCYTES
Immature Retic Fract: 8.7 % (ref 2.3–15.9)
RBC.: 3.61 MIL/uL — ABNORMAL LOW (ref 4.22–5.81)
Retic Count, Absolute: 47.3 10*3/uL (ref 19.0–186.0)
Retic Ct Pct: 1.3 % (ref 0.4–3.1)

## 2022-08-01 LAB — APTT: aPTT: 45 seconds — ABNORMAL HIGH (ref 24–36)

## 2022-08-01 LAB — HEPATITIS B SURFACE ANTIGEN: Hepatitis B Surface Ag: NONREACTIVE

## 2022-08-01 LAB — PROTIME-INR
INR: 1.1 (ref 0.8–1.2)
Prothrombin Time: 14.5 seconds (ref 11.4–15.2)

## 2022-08-01 LAB — IRON AND TIBC
Iron: 78 ug/dL (ref 45–182)
Saturation Ratios: 33 % (ref 17.9–39.5)
TIBC: 234 ug/dL — ABNORMAL LOW (ref 250–450)
UIBC: 156 ug/dL

## 2022-08-01 LAB — FOLATE: Folate: 6.7 ng/mL (ref >5.9)

## 2022-08-01 LAB — TROPONIN I (HIGH SENSITIVITY): Troponin I (High Sensitivity): 25 ng/L — ABNORMAL HIGH (ref <18)

## 2022-08-01 LAB — VITAMIN B12: Vitamin B-12: 148 pg/mL — ABNORMAL LOW (ref 180–914)

## 2022-08-01 MED ORDER — HEPARIN SODIUM (PORCINE) 1000 UNIT/ML IJ SOLN
INTRAMUSCULAR | Status: AC
  Filled 2022-08-01: qty 10

## 2022-08-01 NOTE — Discharge Instructions (Signed)
Patient advised to not miss any dialysis and resume his hemodialysis Tuesday Thursday Saturday

## 2022-08-01 NOTE — Progress Notes (Signed)
Pt tolerated 3.5 hours Hd with 3l uf without complication, post HD report given to ED nurse  08/01/22 1200  Vitals  Temp 98.2 F (36.8 C)  Temp Source Oral  BP 95/73  MAP (mmHg) 81  BP Location Right Arm  BP Method Automatic  Patient Position (if appropriate) Lying  Pulse Rate 81  Pulse Rate Source Monitor  ECG Heart Rate 86  Resp 20  Oxygen Therapy  SpO2 97 %  O2 Device Nasal Cannula  O2 Flow Rate (L/min) 3 L/min  Patient Activity (if Appropriate) In bed  Pulse Oximetry Type Continuous  Oximetry Probe Site Changed No  During Treatment Monitoring  Blood Flow Rate (mL/min) 400 mL/min  HD Safety Checks Performed Yes  Intra-Hemodialysis Comments Tx completed  Post Treatment  Dialyzer Clearance Lightly streaked  Duration of HD Treatment -hour(s) 3.5 hour(s)  Liters Processed 84  Fluid Removed (mL) 3000 mL  Tolerated HD Treatment Yes  Hemodialysis Catheter Right Internal jugular Double lumen Permanent (Tunneled)  Placement Date/Time: 01/20/19 1453   Time Out: Correct patient;Correct site;Correct procedure  Maximum sterile barrier precautions: Hand hygiene;Cap;Mask;Sterile gown;Large sterile sheet;Sterile gloves  Site Prep: Chlorhexidine (preferred)  Local Anes...  Site Condition No complications  Blue Lumen Status Heparin locked;Dead end cap in place  Red Lumen Status Heparin locked;Dead end cap in place  Purple Lumen Status N/A  Catheter fill solution Heparin 1000 units/ml  Catheter fill volume (Arterial) 1.8 cc  Catheter fill volume (Venous) 1.8  Dressing Type Transparent  Dressing Status Antimicrobial disc in place;Clean, Dry, Intact  Drainage Description None  Dressing Change Due 08/07/22  Post treatment catheter status Capped and Clamped

## 2022-08-01 NOTE — Progress Notes (Addendum)
[facility] [place], [place] 08/01/22  Subjective:   LOS: 1  Patient presented to the emergency room via EMS from home.  He called EMS because of shortness of breath.  He missed dialysis on Tuesday and Saturday due to oversleeping.  Did not reschedule his treatments.  He was noted to be hypoxic and required oxygen supplementation. In the ER, he was evaluated and was found to have severely elevated potassium of 6.7 and low calcium of 4.3.  Creatinine high at 14.3.  Patient seen and evaluated during dialysis   HEMODIALYSIS FLOWSHEET:  Blood Flow Rate (mL/min): 400 mL/min Arterial Pressure (mmHg): -160 mmHg Venous Pressure (mmHg): 200 mmHg TMP (mmHg): 26 mmHg Ultrafiltration Rate (mL/min): 1126 mL/min Dialysate Flow Rate (mL/min): 300 ml/min Dialysis Fluid Bolus: Normal Saline  Patient states he feels better today Remains on oxygen, 2 L, denies shortness of breath Lower extremity edema improved No complaints to offer at this time   Objective:  Vital signs in last 24 hours:  Temp:  [96.9 F (36.1 C)-98.6 F (37 C)] 98 F (36.7 C) (04/09 1332) Pulse Rate:  [65-96] 87 (04/09 1332) Resp:  [7-26] 19 (04/09 1332) BP: (61-173)/(37-124) 106/85 (04/09 1332) SpO2:  [89 %-100 %] 95 % (04/09 1332) FiO2 (%):  [40 %] 40 % (04/08 1530) Weight:  [126.5 kg-129.6 kg] 126.5 kg (04/09 1209)  Weight change:  Filed Weights   08/01/22 0816 08/01/22 1209  Weight: 129.6 kg 126.5 kg    Intake/Output:    Intake/Output Summary (Last 24 hours) at 08/01/2022 1410 Last data filed at 08/01/2022 1200 Gross per 24 hour  Intake 100 ml  Output 6000 ml  Net -5900 ml      Physical Exam: General: NAD, laying in the bed  HEENT Anicteric, moist oral mucous membranes  Pulm/lungs basilar crackles,[place] O2  CVS/Heart Regular, tachycardic  Abdomen:  Soft, nontender, nondistended  Extremities: No peripheral edema  Neurologic: Alert, able to follow commands  Skin: Warm, dry  Access:  Right IJ PermCath       Basic Metabolic Panel:  Recent Labs  Lab 07/31/22 1530 07/31/22 1855 07/31/22 2042 08/01/22 0016 08/01/22 0533  NA 143  --   --  136 138  K 6.7* 6.3* 6.1* 4.9 5.4*  CL 115*  --   --  96* 99  CO2 14*  --   --  25 25  GLUCOSE 62*  --   --  109* 98  BUN 67*  --   --  55* 60*  CREATININE 14.35*  --   --  14.44* 14.45*  CALCIUM 4.3*  --   --  7.4* 6.8*  PHOS  --   --   --  6.8* 7.3*      CBC: Recent Labs  Lab 07/31/22 1423 08/01/22 0016 08/01/22 0533  WBC 7.1 6.0 5.3  HGB 11.4* 10.4* 10.6*  HCT 37.3* 33.4* 34.1*  MCV 96.4 94.6 94.2  PLT 130* 130* 120*       Lab Results  Component Value Date   HEPBSAG NON REACTIVE 07/31/2022   HEPBSAB Reactive (A) 09/16/2021   HEPBIGM NON REACTIVE 01/26/2020      Microbiology:  No results found for this or any previous visit (from the past 240 hour(s)).  Coagulation Studies: Recent Labs    08/01/22 0533  LABPROT 14.5  INR 1.1    Urinalysis: No results for input(s): "COLORURINE", "LABSPEC", "PHURINE", "GLUCOSEU", "HGBUR", "BILIRUBINUR", "KETONESUR", "PROTEINUR", "UROBILINOGEN", "NITRITE", "LEUKOCYTESUR" in the last 72 hours.  Invalid  input(s): "APPERANCEUR"    Imaging: DG Chest 2 View  Result Date: 07/31/2022 CLINICAL DATA:  Chest pain.  Shortness of breath. EXAM: CHEST - 2 VIEW COMPARISON:  Chest radiograph 09/16/2021 FINDINGS: A right jugular catheter remains in place, terminating over the high right atrium. A vascular stent/graft is noted in the left axillary region. The cardiac silhouette is borderline enlarged. The lungs are hypoinflated with chronic elevation of the right hemidiaphragm. Mild central pulmonary vascular congestion is present. No overt pulmonary edema, airspace consolidation, sizeable pleural effusion, or pneumothorax is identified. No acute osseous abnormality is seen. IMPRESSION: Low lung volumes with mild central pulmonary vascular congestion. Electronically Signed   By:  Sebastian Ache M.D.   On: 07/31/2022 15:03     Medications:    anticoagulant sodium citrate      calcium acetate  2,001 mg Oral TID WC   Chlorhexidine Gluconate Cloth  6 each Topical Q0600   heparin  5,000 Units Subcutaneous Q8H   sodium chloride flush  3 mL Intravenous Q12H   acetaminophen, alteplase, anticoagulant sodium citrate, heparin, lidocaine (PF), lidocaine-prilocaine, oxyCODONE-acetaminophen **AND** oxyCODONE, pentafluoroprop-tetrafluoroeth  Assessment/ Plan:  47 y.o. male with end-stage renal disease on hemodialysis TTS, CHF, depression, GERD, gout, hypertension, severe peripheral neuropathy was admitted on 07/31/2022 for  Principal Problem:   Hyperkalemia Active Problems:   ESRD on dialysis (HCC)   Fluid overload   Hypocalcemia   V-tach   Weakness   Cytopenia   Troponin I above reference range  Hyperkalemia [E87.5] Acute pulmonary edema [J81.0] V tach [I47.20] ESRD (end stage renal disease) [N18.6]  [facility]/TTS/Lt AVF  #.  Severe hyperkalemia and shortness of breath in the setting of ESRD -Patient received emergent hemodialysis in emergency department for potassium 6.3 with EKG changes.  Potassium remains slightly elevated at 5.4, will provide additional dialysis treatment today. If patient is able to be weaned from oxygen, he is cleared to discharge from renal stance.   #. Anemia of CKD  Lab Results  Component Value Date   HGB 10.6 (L) 08/01/2022  Hgb at goal No need for ESA's at this time  #. Secondary hyperparathyroidism of renal origin N 25.81      Component Value Date/Time   PTH 35 01/25/2020 0944   Lab Results  Component Value Date   PHOS 7.3 (H) 08/01/2022   Will continue to monitor bone minerals. Calcium decreased to 6.8. Continue with calcium acetate with meals.    #.  Hypertension Likely due to volume overload. Blood pressure greatly improved with dialysis and fluid removal.    LOS: 1 Wendee Beavers 4/9/20242:10  PM  [facility] [address] [place], [place] [phone]

## 2022-08-01 NOTE — Care Management Obs Status (Signed)
MEDICARE OBSERVATION STATUS NOTIFICATION   Patient Details  Name: Patrick Downs MRN: [id number] Date of Birth: 30-Oct-1975   Medicare Observation Status Notification Given:  Yes    Darleene Cleaver, LCSW 08/01/2022, 2:24 PM

## 2022-08-01 NOTE — Progress Notes (Signed)
[facility]  - [place] [facility]   PATIENT NAME: Patrick Downs    MR#:  [id number]  DATE OF BIRTH:  04/14/1976  SUBJECTIVE:  patient came in after he missed dialysis on Thursday and Saturday due to oversleeping at home. Came in with shortness of breath. Patient also had elevated potassium. He got dialyze yesterday and today.    VITALS:  Blood pressure 95/73, pulse 81, temperature 98.2 F (36.8 C), temperature source Oral, resp. rate 20, height 6\' 2"  (1.88 m), weight 126.5 kg, SpO2 97 %.  PHYSICAL EXAMINATION:   GENERAL:  47 y.o.-year-old patient with no acute distress.  LUNGS: Normal breath sounds bilaterally, no wheezing CARDIOVASCULAR: S1, S2 normal. No murmur   ABDOMEN: Soft, nontender, nondistended. Bowel sounds present. Chronic foot drop EXTREMITIES: No  edema b/l.    NEUROLOGIC: nonfocal  patient is alert and awake SKIN: No obvious rash, lesion, or ulcer.   LABORATORY PANEL:  CBC Recent Labs  Lab 08/01/22 0533  WBC 5.3  HGB 10.6*  HCT 34.1*  PLT 120*    Chemistries  Recent Labs  Lab 08/01/22 0533  NA 138  K 5.4*  CL 99  CO2 25  GLUCOSE 98  BUN 60*  CREATININE 14.45*  CALCIUM 6.8*   Cardiac Enzymes No results for input(s): "TROPONINI" in the last 168 hours. RADIOLOGY:  DG Chest 2 View  Result Date: 07/31/2022 CLINICAL DATA:  Chest pain.  Shortness of breath. EXAM: CHEST - 2 VIEW COMPARISON:  Chest radiograph 09/16/2021 FINDINGS: A right jugular catheter remains in place, terminating over the high right atrium. A vascular stent/graft is noted in the left axillary region. The cardiac silhouette is borderline enlarged. The lungs are hypoinflated with chronic elevation of the right hemidiaphragm. Mild central pulmonary vascular congestion is present. No overt pulmonary edema, airspace consolidation, sizeable pleural effusion, or pneumothorax is identified. No acute osseous abnormality is seen. IMPRESSION: Low lung volumes with mild central  pulmonary vascular congestion. Electronically Signed   By: Sebastian Ache M.D.   On: 07/31/2022 15:03    Assessment and Plan Patrick Downs is a 47 y.o. male with medical history significant of end-stage renal disease on dialysis.  Patient reports that he has been sleeping late last several days and due to that has missed at least 2 of his dialysis sessions.  Meanwhile patient has had increasing trouble breathing as well as generalized weakness. He was found to be volume overloaded with elevated potassium more than six. He was dialyze yesterday emergently the emergency room and had repeat dialysis today.  Hyperkalemia secondary to missing hemodialysis times two sessions. K 6.7 fluid overload/pulmonary edema end-stage disease on hemodialysis -- patient got dialyze yesterday and today. -- Seen by Dr. Thedore Mins. -- Had ultrafiltration of more than 6 L. --Wean oxygen to RA  Ventricular tachycardia -- patient had an episode of V tach. Required 200 due to synchronous cardioversion and converted to sinus rhythm. But ER notes also required three compressions. He did not lose his pulse.  Anemia of chronic disease -- patient gets EPO with HD  elevated blood pressure without diagnosis of hypertension -- suspected due to volume overload. --Patient is not take BP meds at home  --blood pressure is stable  I will try to wean patient off oxygen. If able to consider discharge later today or tomorrow  Procedures: Family communication :none today Consults :nephrology CODE STATUS: Full DVT Prophylaxis :heparin Level of care: Progressive Status is: Inpatient Remains inpatient appropriate because: V tach,  hyperkalemia, volume overload    TOTAL TIME TAKING CARE OF THIS PATIENT: 35 minutes.  >50% time spent on counselling and coordination of care  Note: This dictation was prepared with Dragon dictation along with smaller phrase technology. Any transcriptional errors that result from this process are  unintentional.  Enedina Finner M.D    [facility]   CC: Primary care physician; Mick Sell, MD

## 2022-08-01 NOTE — ED Notes (Addendum)
Report given to dialysis. Pt updated that dialysis will come get him in about an hour. Denies any needs at this time

## 2022-08-01 NOTE — Care Management CC44 (Signed)
Condition Code 44 Documentation Completed  Patient Details  Name: Patrick Downs MRN: [id number] Date of Birth: 1975/05/18   Condition Code 44 given:  Yes Patient signature on Condition Code 44 notice:  Yes Documentation of 2 MD's agreement:  Yes Code 44 added to claim:  Yes    Darleene Cleaver, LCSW 08/01/2022, 2:25 PM

## 2022-08-01 NOTE — Discharge Summary (Addendum)
Physician Discharge Summary   Patient: Patrick Downs MRN: [id number] DOB: 11/23/1975  Admit date:     07/31/2022  Discharge date: 08/01/22  Discharge Physician: Enedina FinnerSona Makaia Rappa   PCP: Mick SellFitzgerald, David P, MD   Recommendations at discharge:   follow-up PCP in 1 to 2 weeks. Resume you hemodialysis Tuesday Thursday Saturday. Patient advised not to miss any dialysis.  Discharge Diagnoses: Principal Problem:   Hyperkalemia Active Problems:   Fluid overload   ESRD on dialysis (HCC)   Hypocalcemia   V-tach   Weakness   Cytopenia   Troponin I above reference range   Patrick Downs is a 47 y.o. male with medical history significant of end-stage renal disease on dialysis.  Patient reports that he has been sleeping late last several days and due to that has missed at least 2 of his dialysis sessions.  Meanwhile patient has had increasing trouble breathing as well as generalized weakness. He was found to be volume overloaded with elevated potassium more than six. He was dialyze yesterday emergently the emergency room and had repeat dialysis today.   Hyperkalemia secondary to missing hemodialysis times two sessions. K 6.7 fluid overload/pulmonary edema end-stage disease on hemodialysis -- patient got dialyze yesterday and today. -- Seen by Dr. Thedore MinsSingh. -- Had ultrafiltration of ~ 6 L. --Weaned oxygen to RA--sats 95%   Ventricular tachycardia in the setting of Hyperkalemia -- patient had an episode of V tach. Required 200 due to synchronous cardioversion and converted to sinus rhythm. But ER notes also required three compressions. He did not lose his pulse. -- Patient currently in sinus rhythm.   Anemia of chronic disease -- patient gets EPO with HD   hypertension -- suspected due to volume overload. --Patient is not taking BP meds at home  --blood pressure is stable   patient was being of oxygen. Discussed discharge plan with patient an family at bedside.  Okay to discharge from  nephrology Dr. Thedore MinsSingh  Procedures: cardioversion, in-house dialysis Family communication : family at bedside Consults :nephrology CODE STATUS: Full DVT Prophylaxis :heparin      Diet recommendation: Pain control - [place] Controlled Substance Reporting System database was reviewed. and patient was instructed, not to drive, operate heavy machinery, perform activities at heights, swimming or participation in water activities or provide baby-sitting services while on Pain, Sleep and Anxiety Medications; until their outpatient Physician has advised to do so again. Also recommended to not to take more than prescribed Pain, Sleep and Anxiety Medications.  Discharge Diet Orders (From admission, onward)     Start     Ordered   08/01/22 0000  Diet - low sodium heart healthy        08/01/22 1336           Renal diet DISCHARGE MEDICATION: Allergies as of 08/01/2022       Reactions   Hepatitis B Vaccine    Other reaction(s): Unknown   Ondansetron Other (See Comments)   Stomach pain    Minoxidil Other (See Comments)   "put fluid around my heart", PERICARDIAL EFFUSION   Morphine And Related Other (See Comments)   Aggressive    Omnipaque [iohexol] Itching, Other (See Comments)   Rigors on one occasion, widespread itching on a separate occasion (resolved with Benadryl), tremors        Medication List     TAKE these medications    acetaminophen 500 MG tablet Commonly known as: TYLENOL Take 1,000-1,500 mg by mouth 2 (two) times daily  as needed for moderate pain or headache.   Auryxia 1 GM 210 MG(Fe) tablet Generic drug: ferric citrate Take 420 mg by mouth 3 (three) times daily.   calcium acetate 667 MG capsule Commonly known as: PHOSLO Take 2,001 mg by mouth 3 (three) times daily.   hydrOXYzine 25 MG tablet Commonly known as: ATARAX Take 1 tablet by mouth 2 (two) times daily.   oxyCODONE-acetaminophen 10-325 MG tablet Commonly known as: Percocet Take 1 tablet by  mouth every 4 (four) hours as needed for pain.   pregabalin 25 MG capsule Commonly known as: LYRICA Take by mouth.   sildenafil 100 MG tablet Commonly known as: VIAGRA Take 100 mg by mouth daily as needed for erectile dysfunction.        Follow-up Information     Mick Sell, MD. Schedule an appointment as soon as possible for a visit in 1 week(s).   Specialty: Infectious Diseases Why: hospital f/u Contact information: [address] [place] [address] [phone]                Filed Weights   08/01/22 0816 08/01/22 1209  Weight: 129.6 kg 126.5 kg     Condition at discharge: fair  The results of significant diagnostics from this hospitalization (including imaging, microbiology, ancillary and laboratory) are listed below for reference.   Imaging Studies: DG Chest 2 View  Result Date: 07/31/2022 CLINICAL DATA:  Chest pain.  Shortness of breath. EXAM: CHEST - 2 VIEW COMPARISON:  Chest radiograph 09/16/2021 FINDINGS: A right jugular catheter remains in place, terminating over the high right atrium. A vascular stent/graft is noted in the left axillary region. The cardiac silhouette is borderline enlarged. The lungs are hypoinflated with chronic elevation of the right hemidiaphragm. Mild central pulmonary vascular congestion is present. No overt pulmonary edema, airspace consolidation, sizeable pleural effusion, or pneumothorax is identified. No acute osseous abnormality is seen. IMPRESSION: Low lung volumes with mild central pulmonary vascular congestion. Electronically Signed   By: Sebastian Ache M.D.   On: 07/31/2022 15:03   MR TWSFK RIGHT WO CONTRAST  Result Date: 07/23/2022 CLINICAL DATA:  Posterior thigh pain with inability to straighten leg and bear weight. Injury playing kickball. EXAM: MRI OF THE RIGHT FEMUR WITHOUT CONTRAST TECHNIQUE: Multiplanar, multisequence MR imaging of the right femur was performed. No intravenous contrast was administered.  COMPARISON:  Radiographs same date. FINDINGS: Bones/Joint/Cartilage No evidence of acute fracture or dislocation. Both thighs are included on the coronal images, and there is chronic femoral head osteonecrosis bilaterally. No significant surrounding bone marrow edema, subchondral collapse or secondary degenerative change at either hip. There is no significant hip or knee joint effusion. Ligaments No significant ligamentous abnormalities are identified. Muscles and Tendons There is edema throughout the hamstring musculature in the proximal right thigh, primarily involving the biceps and semitendinosus muscles. The long head of the biceps tendon appears ruptured and distally retracted approximately 9 cm into the proximal thigh. The additional proximal hamstring tendons appear intact. There is no significant edema surrounding the common hamstring origin from the ischial tuberosity. There is no reactive edema or avulsion fracture of the ischium. The quadriceps and adductor musculature appears normal. The distal thigh musculature appears normal. Soft tissues There is soft tissue edema surrounding proximal hamstring musculature without focal fluid collection or foreign body. IMPRESSION: 1. Complete proximal tear of the long head of the biceps tendon with distal retraction into the thigh. Associated muscular edema throughout the hamstring musculature. 2. The additional proximal  hamstring tendons appear intact. 3. No acute osseous findings. Chronic bilateral femoral head osteonecrosis. Electronically Signed   By: Carey Bullocks M.D.   On: 07/23/2022 10:07   DG Femur Min 2 Views Right  Result Date: 07/22/2022 CLINICAL DATA:  Sudden sharp pain in the right thigh EXAM: RIGHT FEMUR 2 VIEWS COMPARISON:  Or abdominal radiography 01/21/2018 FINDINGS: No evidence of acute fracture. Chronic benign sclerotic focus in the femoral neck. Newly seen lucency probably within the acetabulum, suggests degenerative change of the hip  joint. Incidental note is made of arterial and vas deferens calcification. IMPRESSION: No acute fracture. Newly seen lucency probably within the acetabulum suggesting degenerative change of the hip joint. Chronic benign sclerotic focus in the femoral neck. Electronically Signed   By: Paulina Fusi M.D.   On: 07/22/2022 21:16    Microbiology: Results for orders placed or performed during the hospital encounter of 09/16/21  Blood culture (routine x 2)     Status: None   Collection Time: 09/16/21  8:15 PM   Specimen: BLOOD  Result Value Ref Range Status   Specimen Description BLOOD BLOOD LEFT FOREARM  Final   Special Requests   Final    BOTTLES DRAWN AEROBIC AND ANAEROBIC Blood Culture results may not be optimal due to an inadequate volume of blood received in culture bottles   Culture   Final    NO GROWTH 5 DAYS Performed at [facility], [address] [place], [place] [address]    Report Status 09/21/2021 FINAL  Final  Culture, blood (Routine X 2) w Reflex to ID Panel     Status: None   Collection Time: 09/19/21  5:02 AM   Specimen: BLOOD RIGHT HAND  Result Value Ref Range Status   Specimen Description BLOOD RIGHT HAND  Final   Special Requests   Final    BOTTLES DRAWN AEROBIC AND ANAEROBIC Blood Culture results may not be optimal due to an excessive volume of blood received in culture bottles   Culture   Final    NO GROWTH 5 DAYS Performed at [facility], [address] [place], [place] [address]    Report Status 09/24/2021 FINAL  Final    Labs: CBC: Recent Labs  Lab 07/31/22 1423 08/01/22 0016 08/01/22 0533  WBC 7.1 6.0 5.3  HGB 11.4* 10.4* 10.6*  HCT 37.3* 33.4* 34.1*  MCV 96.4 94.6 94.2  PLT 130* 130* 120*   Basic Metabolic Panel: Recent Labs  Lab 07/31/22 1530 07/31/22 1855 07/31/22 2042 08/01/22 0016 08/01/22 0533  NA 143  --   --  136 138  K 6.7* 6.3* 6.1* 4.9 5.4*  CL 115*  --   --  96* 99  CO2 14*  --   --  25 25  GLUCOSE 62*  --    --  109* 98  BUN 67*  --   --  55* 60*  CREATININE 14.35*  --   --  14.44* 14.45*  CALCIUM 4.3*  --   --  7.4* 6.8*  PHOS  --   --   --  6.8* 7.3*   Liver Function Tests: Recent Labs  Lab 08/01/22 0533  ALBUMIN 4.1   CBG: Recent Labs  Lab 07/31/22 1551 07/31/22 1713 07/31/22 1733 07/31/22 1958  GLUCAP 107* 123* 143* 75    Discharge time spent: greater than 30 minutes.  Signed: Enedina Finner, MD [facility] 08/01/2022

## 2022-08-02 LAB — HEPATITIS B SURFACE ANTIBODY, QUANTITATIVE: Hep B S AB Quant (Post): 400 m[IU]/mL (ref >9.9)

## 2022-08-02 LAB — CALCIUM, IONIZED: Calcium, Ionized, Serum: 3.7 mg/dL — ABNORMAL LOW (ref 4.5–5.6)

## 2022-08-02 LAB — PARATHYROID HORMONE, INTACT (NO CA): PTH: 115 pg/mL — ABNORMAL HIGH (ref 15–65)

## 2022-08-03 LAB — HAPTOGLOBIN: Haptoglobin: 133 mg/dL (ref 23–355)

## 2022-08-03 MED FILL — Medication: Qty: 1 | Status: AC

## 2022-08-22 ENCOUNTER — Ambulatory Visit: Payer: 59 | Attending: Anesthesiology | Admitting: Anesthesiology

## 2022-08-22 DIAGNOSIS — G894 Chronic pain syndrome: Secondary | ICD-10-CM

## 2022-08-22 DIAGNOSIS — G8929 Other chronic pain: Secondary | ICD-10-CM

## 2022-08-22 DIAGNOSIS — G610 Guillain-Barre syndrome: Secondary | ICD-10-CM

## 2022-08-22 DIAGNOSIS — F1190 Opioid use, unspecified, uncomplicated: Secondary | ICD-10-CM

## 2022-08-22 DIAGNOSIS — M545 Low back pain, unspecified: Secondary | ICD-10-CM

## 2022-08-22 DIAGNOSIS — M5136 Other intervertebral disc degeneration, lumbar region: Secondary | ICD-10-CM

## 2022-08-22 DIAGNOSIS — N186 End stage renal disease: Secondary | ICD-10-CM

## 2022-08-22 DIAGNOSIS — R29898 Other symptoms and signs involving the musculoskeletal system: Secondary | ICD-10-CM

## 2022-08-22 DIAGNOSIS — M47816 Spondylosis without myelopathy or radiculopathy, lumbar region: Secondary | ICD-10-CM

## 2022-08-23 ENCOUNTER — Telehealth: Payer: Self-pay | Admitting: Anesthesiology

## 2022-08-23 NOTE — Telephone Encounter (Signed)
reschedule

## 2022-08-23 NOTE — Telephone Encounter (Signed)
PT state that he missed Dr. Pernell Dupre call on yesterday for his VV,MM appt. When doctor called he was in a meeting and couldn't answer. PT wanted to see if his meds had been send in if not can doctor give him a call today,PT stated that he will be out of meds on tomorrow. Please give patient a call. TY

## 2022-08-24 ENCOUNTER — Encounter: Payer: Self-pay | Admitting: Anesthesiology

## 2022-08-24 ENCOUNTER — Ambulatory Visit: Payer: 59 | Attending: Anesthesiology | Admitting: Anesthesiology

## 2022-08-24 DIAGNOSIS — M25552 Pain in left hip: Secondary | ICD-10-CM

## 2022-08-24 DIAGNOSIS — G894 Chronic pain syndrome: Secondary | ICD-10-CM | POA: Diagnosis not present

## 2022-08-24 DIAGNOSIS — M47816 Spondylosis without myelopathy or radiculopathy, lumbar region: Secondary | ICD-10-CM

## 2022-08-24 DIAGNOSIS — M5136 Other intervertebral disc degeneration, lumbar region: Secondary | ICD-10-CM | POA: Diagnosis not present

## 2022-08-24 DIAGNOSIS — Z992 Dependence on renal dialysis: Secondary | ICD-10-CM

## 2022-08-24 DIAGNOSIS — R29898 Other symptoms and signs involving the musculoskeletal system: Secondary | ICD-10-CM

## 2022-08-24 DIAGNOSIS — F1190 Opioid use, unspecified, uncomplicated: Secondary | ICD-10-CM | POA: Diagnosis not present

## 2022-08-24 DIAGNOSIS — G8929 Other chronic pain: Secondary | ICD-10-CM

## 2022-08-24 DIAGNOSIS — G610 Guillain-Barre syndrome: Secondary | ICD-10-CM

## 2022-08-24 DIAGNOSIS — N186 End stage renal disease: Secondary | ICD-10-CM

## 2022-08-24 MED ORDER — OXYCODONE-ACETAMINOPHEN 10-325 MG PO TABS: 1.0000 | ORAL_TABLET | ORAL | 0 refills | Status: DC | PRN

## 2022-08-24 MED ORDER — OXYCODONE-ACETAMINOPHEN 10-325 MG PO TABS: 1.0000 | ORAL_TABLET | ORAL | 0 refills | Status: AC | PRN

## 2022-08-24 NOTE — Progress Notes (Signed)
Virtual Visit via Telephone Note  I connected with Patrick Downs on 08/24/22 at 11:20 AM EDT by telephone and verified that I am speaking with the correct person using two identifiers.  Location: Patient: Home Provider: Pain control center   I discussed the limitations, risks, security and privacy concerns of performing an evaluation and management service by telephone and the availability of in person appointments. I also discussed with the patient that there may be a patient responsible charge related to this service. The patient expressed understanding and agreed to proceed.   History of Present Illness: I was able to reach Patrick Downs via telephone.  I was unsuccessful 2 days ago.  He had a meeting at that time and was unable to answer.  Is able to catch up with him today via telephone we were not able to do the video portion the conference.  He reports that he has been doing well.  He still has baseline low back hip knee and occasional leg pain.  He takes his medications as prescribed and is averaging opioid therapy about every 4 hours.  He describes 75 to 80% relief lasting about 4 hours before he gets recurrence of the same pain.  No side effects with his medication are reported.  He maintains that without the medications he is unable to get relief and is in exquisite pain throughout the day.  It is namely an aching gnawing severe pain starting in the low back radiating into the hips buttocks and legs with any type of increased activity.  He maintains the medications enable him to function normally throughout the day and maintain his daily activity level and sleep better at night.  He denies any diverting or illicit use and no change in lower extremity bowel or bladder function is noted.  Review of systems: General: No fevers or chills Pulmonary: No shortness of breath or dyspnea Cardiac: No angina or palpitations or lightheadedness GI: No abdominal pain or constipation Psych: No  depression    Observations/Objective:  Current Outpatient Medications:    acetaminophen (TYLENOL) 500 MG tablet, Take 1,000-1,500 mg by mouth 2 (two) times daily as needed for moderate pain or headache., Disp: , Rfl:    AURYXIA 1 GM 210 MG(Fe) tablet, Take 420 mg by mouth 3 (three) times daily., Disp: , Rfl:    calcium acetate (PHOSLO) 667 MG capsule, Take 2,001 mg by mouth 3 (three) times daily., Disp: , Rfl:    hydrOXYzine (ATARAX) 25 MG tablet, Take 1 tablet by mouth 2 (two) times daily., Disp: , Rfl:    [START ON 08/25/2022] oxyCODONE-acetaminophen (PERCOCET) 10-325 MG tablet, Take 1 tablet by mouth every 4 (four) hours as needed for pain., Disp: 135 tablet, Rfl: 0   [START ON 09/24/2022] oxyCODONE-acetaminophen (PERCOCET) 10-325 MG tablet, Take 1 tablet by mouth every 4 (four) hours as needed for pain., Disp: 135 tablet, Rfl: 0   pregabalin (LYRICA) 25 MG capsule, Take by mouth., Disp: , Rfl:    sildenafil (VIAGRA) 100 MG tablet, Take 100 mg by mouth daily as needed for erectile dysfunction., Disp: , Rfl:    Past Medical History:  Diagnosis Date   CHF (congestive heart failure) (HCC)    Degenerative disc disease, lumbar    Depression    Dialysis patient (HCC)    ESRD (end stage renal disease) on dialysis (HCC)    Failure to thrive in adult    GERD (gastroesophageal reflux disease)    Gout    Guillain Barr syndrome (  HCC)    Guillain Barr syndrome (HCC)    Heart murmur    Hip pain, chronic, left 12/24/2020   HTN (hypertension)    Hyperparathyroidism (HCC)    Kidney failure    MRSA (methicillin resistant staph aureus) culture positive    Peripheral neuropathy    Pneumonia    Renal insufficiency    Respiratory failure (HCC)      Assessment and Plan:  1. Chronic pain syndrome   2. Chronic, continuous use of opioids   3. DDD (degenerative disc disease), lumbar   4. Facet arthritis of lumbar region   5. Hip pain, chronic, left   6. Guillain-Barre syndrome (HCC)   7. Weakness  of both lower extremities   8. ESRD on dialysis [facility])   Based on our conversation I think it is appropriate to refill his medicines for the next 2 months these will be dated for May 3 and June 2.  I have reviewed the [facility] practitioner database information and it is appropriate for refill.  No other changes in his pharmacologic regimen will be initiated.  Continue follow-up with his nephrologist and primary care physicians for baseline medical care with scheduled return to clinic in 2 months. Follow Up Instructions:    I discussed the assessment and treatment plan with the patient. The patient was provided an opportunity to ask questions and all were answered. The patient agreed with the plan and demonstrated an understanding of the instructions.   The patient was advised to call back or seek an in-person evaluation if the symptoms worsen or if the condition fails to improve as anticipated.  I provided 30 minutes of non-face-to-face time during this encounter.   Yevette Edwards, MD

## 2022-08-24 NOTE — Progress Notes (Signed)
Virtual Visit via Telephone Note  I connected with Patrick Downs on 08/24/22 at  1:40 PM EDT by telephone and verified that I am speaking with the correct person using two identifiers.  Location: Patient: Home Provider: Pain control center   I discussed the limitations, risks, security and privacy concerns of performing an evaluation and management service by telephone and the availability of in person appointments. I also discussed with the patient that there may be a patient responsible charge related to this service. The patient expressed understanding and agreed to proceed.   History of Present Illness: I was unable to reach Patrick Needle via telephone.  Multiple attempts were made.  This note is dictated on May 2 at which point I was able to reach him.  Medications were initiated.   Observations/Objective:   Assessment and Plan:   Follow Up Instructions:    I discussed the assessment and treatment plan with the patient. The patient was provided an opportunity to ask questions and all were answered. The patient agreed with the plan and demonstrated an understanding of the instructions.   The patient was advised to call back or seek an in-person evaluation if the symptoms worsen or if the condition fails to improve as anticipated.  I provided 0 minutes of non-face-to-face time during this encounter.   Patrick Edwards, MD

## 2022-10-23 ENCOUNTER — Encounter: Payer: Self-pay | Admitting: Anesthesiology

## 2022-10-23 ENCOUNTER — Ambulatory Visit: Payer: 59 | Attending: Anesthesiology | Admitting: Anesthesiology

## 2022-10-23 DIAGNOSIS — R29898 Other symptoms and signs involving the musculoskeletal system: Secondary | ICD-10-CM

## 2022-10-23 DIAGNOSIS — M25552 Pain in left hip: Secondary | ICD-10-CM

## 2022-10-23 DIAGNOSIS — M5136 Other intervertebral disc degeneration, lumbar region: Secondary | ICD-10-CM | POA: Diagnosis not present

## 2022-10-23 DIAGNOSIS — F1190 Opioid use, unspecified, uncomplicated: Secondary | ICD-10-CM

## 2022-10-23 DIAGNOSIS — G894 Chronic pain syndrome: Secondary | ICD-10-CM | POA: Diagnosis not present

## 2022-10-23 DIAGNOSIS — G8929 Other chronic pain: Secondary | ICD-10-CM

## 2022-10-23 DIAGNOSIS — M47816 Spondylosis without myelopathy or radiculopathy, lumbar region: Secondary | ICD-10-CM | POA: Diagnosis not present

## 2022-10-23 DIAGNOSIS — N186 End stage renal disease: Secondary | ICD-10-CM

## 2022-10-23 DIAGNOSIS — G610 Guillain-Barre syndrome: Secondary | ICD-10-CM

## 2022-10-23 DIAGNOSIS — Z992 Dependence on renal dialysis: Secondary | ICD-10-CM

## 2022-10-23 MED ORDER — OXYCODONE-ACETAMINOPHEN 10-325 MG PO TABS: 1.0000 | ORAL_TABLET | ORAL | 0 refills | Status: DC | PRN

## 2022-10-23 MED ORDER — OXYCODONE-ACETAMINOPHEN 10-325 MG PO TABS: 1.0000 | ORAL_TABLET | ORAL | 0 refills | Status: AC | PRN

## 2022-10-23 NOTE — Progress Notes (Signed)
Virtual Visit via Telephone Note  I connected with Patrick Downs on 10/23/22 at  4:35 PM EDT by telephone and verified that I am speaking with the correct person using two identifiers.  Location: Patient: Home Provider: Pain control center   I discussed the limitations, risks, security and privacy concerns of performing an evaluation and management service by telephone and the availability of in person appointments. I also discussed with the patient that there may be a patient responsible charge related to this service. The patient expressed understanding and agreed to proceed.   History of Present Illness: I spoke with Patrick Downs via telephone for his virtual appointment.  We were unable to do this via video.  He reports that he is doing well.  No significant changes in lower back are noted.  He still has a fair amount of back pain throughout the day but it it is responsive to chronic opioid therapy and that works well for him.  He gets about 60 to 75% relief on average.  He takes his medications approximately every 4-6 hours.  On average month he uses 135 tablets without side effect and this is continue to work well for him.  Otherwise he is in his usual state of health.  No changes in lower extremity strength function bowel function are noted.  He does have chronic renal insufficiency.  This has been stable without change.  He continues to follow-up with his primary care physicians and nephrologist.  Otherwise he is doing well with chronic opioid therapy .   The quality characteristic and distribution of the low back pain he does experience has been stable in nature.  Review of systems: General: No fevers or chills Pulmonary: No shortness of breath or dyspnea Cardiac: No angina or palpitations or lightheadedness GI: No abdominal pain or constipation Psych: No depression  observations/Objective:  Current Outpatient Medications:    [START ON 11/23/2022] oxyCODONE-acetaminophen (PERCOCET)  10-325 MG tablet, Take 1 tablet by mouth every 4 (four) hours as needed for pain., Disp: 135 tablet, Rfl: 0   acetaminophen (TYLENOL) 500 MG tablet, Take 1,000-1,500 mg by mouth 2 (two) times daily as needed for moderate pain or headache., Disp: , Rfl:    AURYXIA 1 GM 210 MG(Fe) tablet, Take 420 mg by mouth 3 (three) times daily., Disp: , Rfl:    calcium acetate (PHOSLO) 667 MG capsule, Take 2,001 mg by mouth 3 (three) times daily., Disp: , Rfl:    hydrOXYzine (ATARAX) 25 MG tablet, Take 1 tablet by mouth 2 (two) times daily., Disp: , Rfl:    [START ON 10/24/2022] oxyCODONE-acetaminophen (PERCOCET) 10-325 MG tablet, Take 1 tablet by mouth every 4 (four) hours as needed for pain., Disp: 135 tablet, Rfl: 0   pregabalin (LYRICA) 25 MG capsule, Take by mouth., Disp: , Rfl:    sildenafil (VIAGRA) 100 MG tablet, Take 100 mg by mouth daily as needed for erectile dysfunction., Disp: , Rfl:    Past Medical History:  Diagnosis Date   CHF (congestive heart failure) (HCC)    Degenerative disc disease, lumbar    Depression    Dialysis patient (HCC)    ESRD (end stage renal disease) on dialysis (HCC)    Failure to thrive in adult    GERD (gastroesophageal reflux disease)    Gout    Guillain Barr syndrome (HCC)    Guillain Barr syndrome (HCC)    Heart murmur    Hip pain, chronic, left 12/24/2020   HTN (hypertension)  Hyperparathyroidism (HCC)    Kidney failure    MRSA (methicillin resistant staph aureus) culture positive    Peripheral neuropathy    Pneumonia    Renal insufficiency    Respiratory failure (HCC)     Assessment and Plan:  1. Chronic pain syndrome   2. Chronic, continuous use of opioids   3. DDD (degenerative disc disease), lumbar   4. Facet arthritis of lumbar region   5. Hip pain, chronic, left   6. Guillain-Barre syndrome (HCC)   7. Weakness of both lower extremities   8. ESRD on dialysis (HCC)   9. Chronic bilateral low back pain without sciatica     Based on our  conversation today I think it is appropriate to refill his medicines.  I have reviewed the [facility] practitioner database information and it is appropriate.  This to be dated for July 2 and August 1.  He continues to respond favorably to chronic opioid therapy and the medications enable him to continue to fulfill daily activities and sleep better at night and retain an active functional status.  They are without side effect.  Continue core stretching strengthening as he is doing with return to clinic scheduled in 2 months continue follow-up with primary care physicians for baseline medical care as well. Follow Up Instructions:    I discussed the assessment and treatment plan with the patient. The patient was provided an opportunity to ask questions and all were answered. The patient agreed with the plan and demonstrated an understanding of the instructions.   The patient was advised to call back or seek an in-person evaluation if the symptoms worsen or if the condition fails to improve as anticipated.  I provided 30 minutes of non-face-to-face time during this encounter.   Yevette Edwards, MD

## 2022-11-22 ENCOUNTER — Encounter: Payer: 59 | Admitting: Anesthesiology

## 2022-12-22 ENCOUNTER — Ambulatory Visit: Payer: 59 | Admitting: Anesthesiology

## 2022-12-22 ENCOUNTER — Encounter: Payer: Self-pay | Admitting: Anesthesiology

## 2022-12-22 DIAGNOSIS — Z992 Dependence on renal dialysis: Secondary | ICD-10-CM

## 2022-12-22 DIAGNOSIS — M5136 Other intervertebral disc degeneration, lumbar region: Secondary | ICD-10-CM

## 2022-12-22 DIAGNOSIS — R29898 Other symptoms and signs involving the musculoskeletal system: Secondary | ICD-10-CM

## 2022-12-22 DIAGNOSIS — M25552 Pain in left hip: Secondary | ICD-10-CM

## 2022-12-22 DIAGNOSIS — Z79891 Long term (current) use of opiate analgesic: Secondary | ICD-10-CM

## 2022-12-22 DIAGNOSIS — G894 Chronic pain syndrome: Secondary | ICD-10-CM

## 2022-12-22 DIAGNOSIS — N186 End stage renal disease: Secondary | ICD-10-CM

## 2022-12-22 DIAGNOSIS — M545 Low back pain, unspecified: Secondary | ICD-10-CM

## 2022-12-22 DIAGNOSIS — G610 Guillain-Barre syndrome: Secondary | ICD-10-CM

## 2022-12-22 DIAGNOSIS — F1190 Opioid use, unspecified, uncomplicated: Secondary | ICD-10-CM

## 2022-12-22 DIAGNOSIS — M47816 Spondylosis without myelopathy or radiculopathy, lumbar region: Secondary | ICD-10-CM

## 2022-12-22 DIAGNOSIS — G8929 Other chronic pain: Secondary | ICD-10-CM

## 2022-12-22 DIAGNOSIS — M51369 Other intervertebral disc degeneration, lumbar region without mention of lumbar back pain or lower extremity pain: Secondary | ICD-10-CM

## 2022-12-22 MED ORDER — OXYCODONE-ACETAMINOPHEN 10-325 MG PO TABS
1.0000 | ORAL_TABLET | ORAL | 0 refills | Status: DC | PRN
Start: 2023-01-22 — End: 2023-02-19

## 2022-12-22 MED ORDER — OXYCODONE-ACETAMINOPHEN 10-325 MG PO TABS: 1.0000 | ORAL_TABLET | ORAL | 0 refills | Status: DC | PRN

## 2022-12-22 NOTE — Progress Notes (Signed)
Virtual Visit via Telephone Note  I connected with Patrick Downs on 12/22/22 at 12:00 PM EDT by telephone and verified that I am speaking with the correct person using two identifiers.  Location: Patient: Home Provider: Pain control center   I discussed the limitations, risks, security and privacy concerns of performing an evaluation and management service by telephone and the availability of in person appointments. I also discussed with the patient that there may be a patient responsible charge related to this service. The patient expressed understanding and agreed to proceed.   History of Present Illness: I was able to reach out to [employer] via telephone as we were unable to link for the video portion of this conference but he reports that he is doing well.  He still has back pain lower leg pain comparable to what he has experienced long-term which is of a chronic nature.  He takes Percocet about every 4-6 hours averaging 135/month and this is working well for him.  Unfortunately he has failed more conservative therapy but chronic opioid management has enabled him to stay functional active and sleep better.  No side effects of the medications are noted.  The quality characteristic and distribution of the pain that he experiences has been stable with no change in lower extremity strength or function or bowel or bladder function.  He still does dialysis for routine without any change on renal function status noted.  Otherwise he is in his usual state of health.  Review of systems: General: No fevers or chills Pulmonary: No shortness of breath or dyspnea Cardiac: No angina or palpitations or lightheadedness GI: No abdominal pain or constipation Psych: No depression    Observations/Objective:   Current Outpatient Medications:    [START ON 01/22/2023] oxyCODONE-acetaminophen (PERCOCET) 10-325 MG tablet, Take 1 tablet by mouth every 4 (four) hours as needed for pain., Disp: 135 tablet,  Rfl: 0   acetaminophen (TYLENOL) 500 MG tablet, Take 1,000-1,500 mg by mouth 2 (two) times daily as needed for moderate pain or headache., Disp: , Rfl:    AURYXIA 1 GM 210 MG(Fe) tablet, Take 420 mg by mouth 3 (three) times daily., Disp: , Rfl:    calcium acetate (PHOSLO) 667 MG capsule, Take 2,001 mg by mouth 3 (three) times daily., Disp: , Rfl:    hydrOXYzine (ATARAX) 25 MG tablet, Take 1 tablet by mouth 2 (two) times daily., Disp: , Rfl:    [START ON 12/23/2022] oxyCODONE-acetaminophen (PERCOCET) 10-325 MG tablet, Take 1 tablet by mouth every 4 (four) hours as needed for pain., Disp: 135 tablet, Rfl: 0   pregabalin (LYRICA) 25 MG capsule, Take by mouth., Disp: , Rfl:    sildenafil (VIAGRA) 100 MG tablet, Take 100 mg by mouth daily as needed for erectile dysfunction., Disp: , Rfl:    Past Medical History:  Diagnosis Date   CHF (congestive heart failure) (HCC)    Degenerative disc disease, lumbar    Depression    Dialysis patient (HCC)    ESRD (end stage renal disease) on dialysis (HCC)    Failure to thrive in adult    GERD (gastroesophageal reflux disease)    Gout    Guillain Barr syndrome (HCC)    Guillain Barr syndrome (HCC)    Heart murmur    Hip pain, chronic, left 12/24/2020   HTN (hypertension)    Hyperparathyroidism (HCC)    Kidney failure    MRSA (methicillin resistant staph aureus) culture positive    Peripheral neuropathy  Pneumonia    Renal insufficiency    Respiratory failure (HCC)     Assessment and Plan:  1. Chronic pain syndrome   2. Chronic, continuous use of opioids   3. DDD (degenerative disc disease), lumbar   4. Facet arthritis of lumbar region   5. Hip pain, chronic, left   6. Guillain-Barre syndrome (HCC)   7. Weakness of both lower extremities   8. ESRD on dialysis (HCC)   9. Chronic bilateral low back pain without sciatica    Based on our conversation he gets appropriate refill his medicines.  I have reviewed the [facility] practitioner  database information and it is appropriate.  No other pharmacologic changes will be initiated.  Continue stretching strengthening exercises as reviewed with return to clinic scheduled in 2 months.  Continue follow-up with nephrology and primary care medicine doctors for routine medical care. Follow Up Instructions:    I discussed the assessment and treatment plan with the patient. The patient was provided an opportunity to ask questions and all were answered. The patient agreed with the plan and demonstrated an understanding of the instructions.   The patient was advised to call back or seek an in-person evaluation if the symptoms worsen or if the condition fails to improve as anticipated.  I provided 30 minutes of non-face-to-face time during this encounter.   Yevette Edwards, MD

## 2023-01-03 ENCOUNTER — Telehealth: Payer: Self-pay | Admitting: Anesthesiology

## 2023-01-03 ENCOUNTER — Encounter: Payer: Self-pay | Admitting: *Deleted

## 2023-01-03 NOTE — Telephone Encounter (Signed)
Patient needs to have a fax sent to his probation officer Officer Bascom Levels, fax [phone]  Stating that Dr Pernell Dupre is prescribing his Oxycodone and the reason he is taking this medication. Please notify patient when this is done.  Thank you

## 2023-01-03 NOTE — Telephone Encounter (Signed)
Letter ready for pick up and patient is aware. 

## 2023-01-21 ENCOUNTER — Inpatient Hospital Stay
Admission: EM | Admit: 2023-01-21 | Discharge: 2023-01-30 | DRG: 252 | Disposition: A | Discharge status: Home Health | Payer: 59 | Attending: Internal Medicine | Admitting: Internal Medicine

## 2023-01-21 ENCOUNTER — Other Ambulatory Visit: Payer: Self-pay

## 2023-01-21 ENCOUNTER — Inpatient Hospital Stay: Payer: 59

## 2023-01-21 ENCOUNTER — Emergency Department: Payer: 59

## 2023-01-21 DIAGNOSIS — I739 Peripheral vascular disease, unspecified: Secondary | ICD-10-CM | POA: Diagnosis present

## 2023-01-21 DIAGNOSIS — E875 Hyperkalemia: Secondary | ICD-10-CM | POA: Diagnosis not present

## 2023-01-21 DIAGNOSIS — E1142 Type 2 diabetes mellitus with diabetic polyneuropathy: Secondary | ICD-10-CM | POA: Diagnosis present

## 2023-01-21 DIAGNOSIS — Z992 Dependence on renal dialysis: Secondary | ICD-10-CM | POA: Diagnosis not present

## 2023-01-21 DIAGNOSIS — I70235 Atherosclerosis of native arteries of right leg with ulceration of other part of foot: Secondary | ICD-10-CM | POA: Diagnosis not present

## 2023-01-21 DIAGNOSIS — N2581 Secondary hyperparathyroidism of renal origin: Secondary | ICD-10-CM | POA: Diagnosis present

## 2023-01-21 DIAGNOSIS — Z89422 Acquired absence of other left toe(s): Secondary | ICD-10-CM

## 2023-01-21 DIAGNOSIS — D631 Anemia in chronic kidney disease: Secondary | ICD-10-CM | POA: Diagnosis present

## 2023-01-21 DIAGNOSIS — L03031 Cellulitis of right toe: Secondary | ICD-10-CM | POA: Diagnosis present

## 2023-01-21 DIAGNOSIS — M869 Osteomyelitis, unspecified: Secondary | ICD-10-CM | POA: Diagnosis not present

## 2023-01-21 DIAGNOSIS — N529 Male erectile dysfunction, unspecified: Secondary | ICD-10-CM | POA: Diagnosis present

## 2023-01-21 DIAGNOSIS — Z888 Allergy status to other drugs, medicaments and biological substances status: Secondary | ICD-10-CM

## 2023-01-21 DIAGNOSIS — E11621 Type 2 diabetes mellitus with foot ulcer: Secondary | ICD-10-CM | POA: Diagnosis present

## 2023-01-21 DIAGNOSIS — R627 Adult failure to thrive: Secondary | ICD-10-CM | POA: Diagnosis present

## 2023-01-21 DIAGNOSIS — Z887 Allergy status to serum and vaccine status: Secondary | ICD-10-CM

## 2023-01-21 DIAGNOSIS — Z8614 Personal history of Methicillin resistant Staphylococcus aureus infection: Secondary | ICD-10-CM

## 2023-01-21 DIAGNOSIS — L97519 Non-pressure chronic ulcer of other part of right foot with unspecified severity: Principal | ICD-10-CM | POA: Diagnosis present

## 2023-01-21 DIAGNOSIS — E892 Postprocedural hypoparathyroidism: Secondary | ICD-10-CM | POA: Diagnosis present

## 2023-01-21 DIAGNOSIS — E1169 Type 2 diabetes mellitus with other specified complication: Secondary | ICD-10-CM | POA: Diagnosis present

## 2023-01-21 DIAGNOSIS — F32A Depression, unspecified: Secondary | ICD-10-CM | POA: Diagnosis present

## 2023-01-21 DIAGNOSIS — N186 End stage renal disease: Secondary | ICD-10-CM | POA: Diagnosis present

## 2023-01-21 DIAGNOSIS — E8809 Other disorders of plasma-protein metabolism, not elsewhere classified: Secondary | ICD-10-CM | POA: Diagnosis present

## 2023-01-21 DIAGNOSIS — K219 Gastro-esophageal reflux disease without esophagitis: Secondary | ICD-10-CM | POA: Diagnosis present

## 2023-01-21 DIAGNOSIS — Z89421 Acquired absence of other right toe(s): Secondary | ICD-10-CM

## 2023-01-21 DIAGNOSIS — Z8616 Personal history of COVID-19: Secondary | ICD-10-CM

## 2023-01-21 DIAGNOSIS — G629 Polyneuropathy, unspecified: Secondary | ICD-10-CM | POA: Diagnosis not present

## 2023-01-21 DIAGNOSIS — I120 Hypertensive chronic kidney disease with stage 5 chronic kidney disease or end stage renal disease: Secondary | ICD-10-CM | POA: Diagnosis present

## 2023-01-21 DIAGNOSIS — L03115 Cellulitis of right lower limb: Secondary | ICD-10-CM

## 2023-01-21 DIAGNOSIS — Z885 Allergy status to narcotic agent status: Secondary | ICD-10-CM

## 2023-01-21 DIAGNOSIS — Z91041 Radiographic dye allergy status: Secondary | ICD-10-CM

## 2023-01-21 DIAGNOSIS — Z833 Family history of diabetes mellitus: Secondary | ICD-10-CM

## 2023-01-21 DIAGNOSIS — E669 Obesity, unspecified: Secondary | ICD-10-CM | POA: Diagnosis present

## 2023-01-21 DIAGNOSIS — I9589 Other hypotension: Secondary | ICD-10-CM | POA: Diagnosis present

## 2023-01-21 DIAGNOSIS — M109 Gout, unspecified: Secondary | ICD-10-CM | POA: Diagnosis present

## 2023-01-21 DIAGNOSIS — M86671 Other chronic osteomyelitis, right ankle and foot: Secondary | ICD-10-CM | POA: Diagnosis present

## 2023-01-21 DIAGNOSIS — E1152 Type 2 diabetes mellitus with diabetic peripheral angiopathy with gangrene: Secondary | ICD-10-CM | POA: Diagnosis present

## 2023-01-21 DIAGNOSIS — L089 Local infection of the skin and subcutaneous tissue, unspecified: Secondary | ICD-10-CM | POA: Diagnosis not present

## 2023-01-21 DIAGNOSIS — Z79899 Other long term (current) drug therapy: Secondary | ICD-10-CM

## 2023-01-21 DIAGNOSIS — E1140 Type 2 diabetes mellitus with diabetic neuropathy, unspecified: Secondary | ICD-10-CM | POA: Diagnosis not present

## 2023-01-21 DIAGNOSIS — L0390 Cellulitis, unspecified: Secondary | ICD-10-CM | POA: Diagnosis present

## 2023-01-21 DIAGNOSIS — I10 Essential (primary) hypertension: Secondary | ICD-10-CM

## 2023-01-21 DIAGNOSIS — E11628 Type 2 diabetes mellitus with other skin complications: Secondary | ICD-10-CM | POA: Diagnosis not present

## 2023-01-21 DIAGNOSIS — E1122 Type 2 diabetes mellitus with diabetic chronic kidney disease: Secondary | ICD-10-CM | POA: Diagnosis present

## 2023-01-21 DIAGNOSIS — Z6834 Body mass index (BMI) 34.0-34.9, adult: Secondary | ICD-10-CM

## 2023-01-21 DIAGNOSIS — Z841 Family history of disorders of kidney and ureter: Secondary | ICD-10-CM

## 2023-01-21 LAB — CBC WITH DIFFERENTIAL/PLATELET
Abs Immature Granulocytes: 0.02 10*3/uL (ref 0.00–0.07)
Basophils Absolute: 0 10*3/uL (ref 0.0–0.1)
Basophils Relative: 0 %
Eosinophils Absolute: 0 10*3/uL (ref 0.0–0.5)
Eosinophils Relative: 0 %
HCT: 32.4 % — ABNORMAL LOW (ref 39.0–52.0)
Hemoglobin: 10.2 g/dL — ABNORMAL LOW (ref 13.0–17.0)
Immature Granulocytes: 0 %
Lymphocytes Relative: 13 %
Lymphs Abs: 1 10*3/uL (ref 0.7–4.0)
MCH: 29.4 pg (ref 26.0–34.0)
MCHC: 31.5 g/dL (ref 30.0–36.0)
MCV: 93.4 fL (ref 80.0–100.0)
Monocytes Absolute: 0.9 10*3/uL (ref 0.1–1.0)
Monocytes Relative: 11 %
Neutro Abs: 6.2 10*3/uL (ref 1.7–7.7)
Neutrophils Relative %: 76 %
Platelets: 150 10*3/uL (ref 150–400)
RBC: 3.47 MIL/uL — ABNORMAL LOW (ref 4.22–5.81)
RDW: 14.7 % (ref 11.5–15.5)
WBC: 8.1 10*3/uL (ref 4.0–10.5)
nRBC: 0 % (ref 0.0–0.2)

## 2023-01-21 LAB — COMPREHENSIVE METABOLIC PANEL
ALT: 10 U/L (ref 0–44)
AST: 26 U/L (ref 15–41)
Albumin: 2.7 g/dL — ABNORMAL LOW (ref 3.5–5.0)
Alkaline Phosphatase: 34 U/L — ABNORMAL LOW (ref 38–126)
Anion gap: 10 (ref 5–15)
BUN: 31 mg/dL — ABNORMAL HIGH (ref 6–20)
CO2: 21 mmol/L — ABNORMAL LOW (ref 22–32)
Calcium: 5.2 mg/dL — CL (ref 8.9–10.3)
Chloride: 109 mmol/L (ref 98–111)
Creatinine, Ser: 7.66 mg/dL — ABNORMAL HIGH (ref 0.61–1.24)
GFR, Estimated: 8 mL/min — ABNORMAL LOW (ref >60)
Glucose, Bld: 91 mg/dL (ref 70–99)
Potassium: 3.8 mmol/L (ref 3.5–5.1)
Sodium: 140 mmol/L (ref 135–145)
Total Bilirubin: 1.5 mg/dL — ABNORMAL HIGH (ref 0.3–1.2)
Total Protein: 5.6 g/dL — ABNORMAL LOW (ref 6.5–8.1)

## 2023-01-21 LAB — CBG MONITORING, ED: Glucose-Capillary: 120 mg/dL — ABNORMAL HIGH (ref 70–99)

## 2023-01-21 LAB — PROTIME-INR
INR: 1.2 (ref 0.8–1.2)
Prothrombin Time: 15.5 s — ABNORMAL HIGH (ref 11.4–15.2)

## 2023-01-21 LAB — MAGNESIUM: Magnesium: 1.6 mg/dL — ABNORMAL LOW (ref 1.7–2.4)

## 2023-01-21 LAB — PHOSPHORUS: Phosphorus: 2.6 mg/dL (ref 2.5–4.6)

## 2023-01-21 LAB — GLUCOSE, CAPILLARY
Glucose-Capillary: 96 mg/dL (ref 70–99)
Glucose-Capillary: 122 mg/dL — ABNORMAL HIGH (ref 70–99)

## 2023-01-21 LAB — PROCALCITONIN: Procalcitonin: 0.73 ng/mL

## 2023-01-21 LAB — APTT: aPTT: 47 s — ABNORMAL HIGH (ref 24–36)

## 2023-01-21 LAB — PREALBUMIN: Prealbumin: 24 mg/dL (ref 18–38)

## 2023-01-21 LAB — HEMOGLOBIN A1C
Hgb A1c MFr Bld: 6 % — ABNORMAL HIGH (ref 4.8–5.6)
Mean Plasma Glucose: 125.5 mg/dL

## 2023-01-21 LAB — C-REACTIVE PROTEIN: CRP: 10.6 mg/dL — ABNORMAL HIGH (ref <1.0)

## 2023-01-21 LAB — LACTIC ACID, PLASMA: Lactic Acid, Venous: 1.5 mmol/L (ref 0.5–1.9)

## 2023-01-21 LAB — SEDIMENTATION RATE: Sed Rate: 68 mm/h — ABNORMAL HIGH (ref 0–15)

## 2023-01-21 MED ORDER — HYDROMORPHONE HCL 1 MG/ML IJ SOLN: 0.5000 mg | INTRAMUSCULAR | Status: AC | PRN

## 2023-01-21 MED ORDER — INSULIN ASPART 100 UNIT/ML IJ SOLN
0.0000 [IU] | Freq: Three times a day (TID) | INTRAMUSCULAR | Status: DC
  Filled 2023-01-21: qty 1

## 2023-01-21 MED ORDER — ACETAMINOPHEN 325 MG PO TABS
650.0000 mg | ORAL_TABLET | Freq: Two times a day (BID) | ORAL | Status: DC | PRN
  Administered 2023-01-22: 650 mg via ORAL
  Filled 2023-01-21 (×2): qty 2

## 2023-01-21 MED ORDER — HEPARIN SODIUM (PORCINE) 5000 UNIT/ML IJ SOLN
5000.0000 [IU] | Freq: Three times a day (TID) | INTRAMUSCULAR | Status: DC
  Administered 2023-01-21 – 2023-01-30 (×25): 5000 [IU] via SUBCUTANEOUS
  Filled 2023-01-21 (×25): qty 1

## 2023-01-21 MED ORDER — CALCIUM ACETATE (PHOS BINDER) 667 MG PO CAPS: 2001.0000 mg | ORAL_CAPSULE | Freq: Three times a day (TID) | ORAL | Status: DC

## 2023-01-21 MED ORDER — CHLORHEXIDINE GLUCONATE CLOTH 2 % EX PADS
6.0000 | MEDICATED_PAD | Freq: Every day | CUTANEOUS | Status: DC
  Administered 2023-01-22 – 2023-01-30 (×8): 6 via TOPICAL

## 2023-01-21 MED ORDER — OXYCODONE HCL 5 MG PO TABS
5.0000 mg | ORAL_TABLET | ORAL | Status: DC | PRN
  Administered 2023-01-21 – 2023-01-30 (×18): 5 mg via ORAL
  Filled 2023-01-21 (×17): qty 1

## 2023-01-21 MED ORDER — FENTANYL CITRATE PF 50 MCG/ML IJ SOSY
50.0000 ug | PREFILLED_SYRINGE | Freq: Once | INTRAMUSCULAR | Status: AC
  Administered 2023-01-21: 50 ug via INTRAVENOUS
  Filled 2023-01-21: qty 1

## 2023-01-21 MED ORDER — OXYCODONE-ACETAMINOPHEN 5-325 MG PO TABS
1.0000 | ORAL_TABLET | ORAL | Status: DC | PRN
  Administered 2023-01-21 – 2023-01-30 (×17): 1 via ORAL
  Filled 2023-01-21 (×17): qty 1

## 2023-01-21 MED ORDER — METRONIDAZOLE 500 MG/100ML IV SOLN
500.0000 mg | Freq: Two times a day (BID) | INTRAVENOUS | Status: DC
  Administered 2023-01-21 – 2023-01-22 (×3): 500 mg via INTRAVENOUS
  Filled 2023-01-21 (×4): qty 100

## 2023-01-21 MED ORDER — VANCOMYCIN HCL IN DEXTROSE 1-5 GM/200ML-% IV SOLN
1000.0000 mg | Freq: Once | INTRAVENOUS | Status: AC
  Administered 2023-01-21: 1000 mg via INTRAVENOUS
  Filled 2023-01-21: qty 200

## 2023-01-21 MED ORDER — SODIUM CHLORIDE 0.9 % IV SOLN
2.0000 g | Freq: Once | INTRAVENOUS | Status: AC
  Administered 2023-01-21: 2 g via INTRAVENOUS
  Filled 2023-01-21: qty 12.5

## 2023-01-21 MED ORDER — VANCOMYCIN HCL IN DEXTROSE 1-5 GM/200ML-% IV SOLN
1000.0000 mg | INTRAVENOUS | Status: DC
  Filled 2023-01-21 (×3): qty 200

## 2023-01-21 MED ORDER — SODIUM CHLORIDE 0.9 % IV SOLN
1.0000 g | INTRAVENOUS | Status: DC
  Administered 2023-01-22 – 2023-01-25 (×4): 1 g via INTRAVENOUS
  Filled 2023-01-21 (×5): qty 10

## 2023-01-21 MED ORDER — ONDANSETRON HCL 4 MG/2ML IJ SOLN: 4.0000 mg | Freq: Three times a day (TID) | INTRAMUSCULAR | Status: DC | PRN

## 2023-01-21 MED ORDER — VANCOMYCIN HCL 1500 MG/300ML IV SOLN
1500.0000 mg | Freq: Once | INTRAVENOUS | Status: AC
  Administered 2023-01-21: 1500 mg via INTRAVENOUS
  Filled 2023-01-21: qty 300

## 2023-01-21 MED ORDER — HYDROXYZINE HCL 50 MG PO TABS
25.0000 mg | ORAL_TABLET | Freq: Three times a day (TID) | ORAL | Status: DC | PRN
  Administered 2023-01-21 – 2023-01-26 (×6): 25 mg via ORAL
  Filled 2023-01-21 (×7): qty 1

## 2023-01-21 MED ORDER — CALCIUM GLUCONATE-NACL 2-0.675 GM/100ML-% IV SOLN
2.0000 g | Freq: Once | INTRAVENOUS | Status: AC
  Administered 2023-01-21: 2000 mg via INTRAVENOUS
  Filled 2023-01-21: qty 100

## 2023-01-21 MED ORDER — SODIUM CHLORIDE 0.9 % IV SOLN: 250.0000 mL | INTRAVENOUS | Status: DC | PRN

## 2023-01-21 MED ORDER — METRONIDAZOLE 500 MG/100ML IV SOLN
500.0000 mg | Freq: Once | INTRAVENOUS | Status: AC
  Administered 2023-01-21: 500 mg via INTRAVENOUS
  Filled 2023-01-21: qty 100

## 2023-01-21 MED ORDER — SODIUM CHLORIDE 0.9% FLUSH
3.0000 mL | Freq: Two times a day (BID) | INTRAVENOUS | Status: DC
  Administered 2023-01-22 – 2023-01-30 (×14): 3 mL via INTRAVENOUS

## 2023-01-21 MED ORDER — LACTATED RINGERS IV BOLUS (SEPSIS)
500.0000 mL | Freq: Once | INTRAVENOUS | Status: AC
  Administered 2023-01-21: 500 mL via INTRAVENOUS

## 2023-01-21 MED ORDER — VANCOMYCIN HCL IN DEXTROSE 1-5 GM/200ML-% IV SOLN
1000.0000 mg | Freq: Once | INTRAVENOUS | Status: DC
  Filled 2023-01-21: qty 200

## 2023-01-21 MED ORDER — PREGABALIN 25 MG PO CAPS: 25.0000 mg | ORAL_CAPSULE | Freq: Every day | ORAL | Status: DC

## 2023-01-21 MED ORDER — SODIUM CHLORIDE 0.9 % IV BOLUS (SEPSIS)
500.0000 mL | Freq: Once | INTRAVENOUS | Status: AC
  Administered 2023-01-21: 500 mL via INTRAVENOUS

## 2023-01-21 MED ORDER — SODIUM CHLORIDE 0.9% FLUSH: 3.0000 mL | INTRAVENOUS | Status: DC | PRN

## 2023-01-21 MED ORDER — OXYCODONE-ACETAMINOPHEN 10-325 MG PO TABS: 1.0000 | ORAL_TABLET | ORAL | Status: DC | PRN

## 2023-01-21 MED ORDER — FERRIC CITRATE 1 GM 210 MG(FE) PO TABS: 420.0000 mg | ORAL_TABLET | Freq: Three times a day (TID) | ORAL | Status: DC

## 2023-01-21 NOTE — H&P (Signed)
History and Physical    Patient: Patrick Downs ZOX:[id number] DOB: [birth year] DOA: 01/21/2023 DOS: the patient was seen and examined on 01/21/2023 PCP: Mick Sell, MD  Patient coming from: Home  Chief Complaint:  Chief Complaint  Patient presents with   Foot Wound   HPI: Patrick Downs is a 47 y.o. male with medical history significant of ESRD on hemodialysis Tuesday Thursday Saturday, GERD, hypertension, gout, type 2 diabetes, peripheral neuropathy, prior toe amputation presenting with right lower extremity cellulitis.  Patient reports ulcer formation on the right lateral foot.  Has had worsening drainage over multiple days.  Mom malaise.  Patient does wear a brace on the right foot.  Has been unable to wear brace secondary to swelling.  Noted malodorous drainage from wound.  No fevers or chills.  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  Non-smoker.  Patient denies any alcohol use.  Has been compliant with hemodialysis regimen. Presented to the ER Tmax 99.4, blood pressures 90s to 110s over 50s to 70s.  Satting well on room air.  White count 8.1, hemoglobin 10.2, platelets 150.  Creatinine 7.7, T. bili 1.5.  Right foot plain films with superficial soft tissue wound with subcutaneous gas adjacent to the base of the fifth metatarsal bone. Review of Systems: As mentioned in the history of present illness. All other systems reviewed and are negative. Past Medical History:  Diagnosis Date   CHF (congestive heart failure) (HCC)    Degenerative disc disease, lumbar    Depression    Dialysis patient (HCC)    ESRD (end stage renal disease) on dialysis (HCC)    Failure to thrive in adult    GERD (gastroesophageal reflux disease)    Gout    Guillain Barr syndrome (HCC)    Guillain Barr syndrome (HCC)    Heart murmur    Hip pain, chronic, left 12/24/2020   HTN (hypertension)    Hyperparathyroidism (HCC)    Kidney failure    MRSA (methicillin resistant staph aureus)  culture positive    Peripheral neuropathy    Pneumonia    Renal insufficiency    Respiratory failure [facility])    Past Surgical History:  Procedure Laterality Date   A/V FISTULAGRAM Right 01/07/2018   Procedure: A/V FISTULAGRAM;  Surgeon: Annice Needy, MD;  Location: [facility] INVASIVE CV [facility];  Service: Cardiovascular;  Laterality: Right;   AMPUTATION TOE Right 06/08/2017   Procedure: AMPUTATION TOE RIGHT FIFTH TOE;  Surgeon: Gwyneth Revels, DPM;  Location: [facility] ORS;  Service: Podiatry;  Laterality: Right;   AMPUTATION TOE Left 05/17/2018   Procedure: RAY LEFT 5TH;  Surgeon: Gwyneth Revels, DPM;  Location: [facility] OR[facility];  Service: Podiatry;  Laterality: Left;   AMPUTATION TOE Right 03/05/2020   Procedure: AMPUTATION TOE IPJ X 2 RIGHT GREAT AND 3RD;  Surgeon: Gwyneth Revels, DPM;  Location: [facility] OR[facility];  Service: Podiatry;  Laterality: Right;   AV FISTULA PLACEMENT     x5      2 graphs   AV FISTULA PLACEMENT Right 04/08/2020   Procedure: INSERTION OF ARTERIOVENOUS (AV) GORE-TEX GRAFT ARM ( ARTEGRAFT);  Surgeon: Annice Needy, MD;  Location: [facility] ORS;  Service: Vascular;  Laterality: Right;   DIALYSIS/PERMA CATHETER INSERTION N/A 01/20/2019   Procedure: DIALYSIS/PERMA CATHETER INSERTION;  Surgeon: Annice Needy, MD;  Location: [facility];  Service: Cardiovascular;  Laterality: N/A;   DIALYSIS/PERMA CATHETER REMOVAL N/A 06/17/2018   Procedure: DIALYSIS/PERMA CATHETER REMOVAL;  Surgeon: [name]  S, MD;  Location: [facility];  Service: Cardiovascular;  Laterality: N/A;   PARATHYROIDECTOMY     PERIPHERAL VASCULAR THROMBECTOMY Right 01/22/2019   Procedure: PERIPHERAL VASCULAR THROMBECTOMY;  Surgeon: Annice Needy, MD;  Location: [facility];  Service: Cardiovascular;  Laterality: Right;   REMOVAL OF GRAFT Right 05/16/2020   Procedure: REMOVAL OF GRAFT;  Surgeon: Fransisco Hertz, MD;  Location: [facility];  Service: Vascular;  Laterality: Right;   RENAL BIOPSY     REVISON OF ARTERIOVENOUS  FISTULA Right 02/07/2018   Procedure: REVISON OF ARTERIOVENOUS FISTULA;  Surgeon: Annice Needy, MD;  Location: [facility];  Service: Vascular;  Laterality: Right;   TEE WITHOUT CARDIOVERSION N/A 01/22/2018   Procedure: TRANSESOPHAGEAL ECHOCARDIOGRAM (TEE);  Surgeon: Laurier Nancy, MD;  Location: [facility];  Service: Cardiovascular;  Laterality: N/A;   tonsiilectomy     TONSILLECTOMY     tracheotomy     UPPER EXTREMITY VENOGRAPHY Bilateral 02/23/2020   Procedure: UPPER EXTREMITY VENOGRAPHY;  Surgeon: Annice Needy, MD;  Location: [facility];  Service: Cardiovascular;  Laterality: Bilateral;   Social History:  reports that he has never smoked. He has never used smokeless tobacco. He reports current drug use. Drug: Marijuana. He reports that he does not drink alcohol.  Allergies  Allergen Reactions   Hepatitis B Vaccine     Other reaction(s): Unknown   Ondansetron Other (See Comments)    Stomach pain    Minoxidil Other (See Comments)    "put fluid around my heart", PERICARDIAL EFFUSION   Morphine And Codeine Other (See Comments)    Aggressive    Omnipaque [Iohexol] Itching and Other (See Comments)    Rigors on one occasion, widespread itching on a separate occasion (resolved with Benadryl), tremors    Family History  Problem Relation Age of Onset   Diabetes Mellitus II Father    Kidney disease Father    Kidney failure Paternal Grandfather    Prostate cancer Neg Hx    Kidney cancer Neg Hx    Bladder Cancer Neg Hx     Prior to Admission medications   Medication Sig Start Date End Date Taking? Authorizing Provider  acetaminophen (TYLENOL) 500 MG tablet Take 1,000-1,500 mg by mouth 2 (two) times daily as needed for moderate pain or headache.    [provider]  AURYXIA 1 GM 210 MG(Fe) tablet Take 420 mg by mouth 3 (three) times daily. 11/28/19   [provider]  calcium acetate (PHOSLO) 667 MG capsule Take 2,001 mg by mouth 3 (three) times daily. 08/20/21    [provider]  hydrOXYzine (ATARAX) 25 MG tablet Take 1 tablet by mouth 2 (two) times daily. 12/02/20   [provider]  oxyCODONE-acetaminophen (PERCOCET) 10-325 MG tablet Take 1 tablet by mouth every 4 (four) hours as needed for pain. 12/23/22 01/22/23  Yevette Edwards, MD  oxyCODONE-acetaminophen (PERCOCET) 10-325 MG tablet Take 1 tablet by mouth every 4 (four) hours as needed for pain. 01/22/23 02/21/23  Yevette Edwards, MD  pregabalin (LYRICA) 25 MG capsule Take by mouth.    [provider]  sildenafil (VIAGRA) 100 MG tablet Take 100 mg by mouth daily as needed for erectile dysfunction. 08/20/21   [provider]    Physical Exam: Vitals:   01/21/23 0553 01/21/23 0615 01/21/23 0630 01/21/23 0800  BP: (!) 101/55 100/63 98/62 114/73  Pulse: 83 79 75   Resp: 12 18 11 12   Temp:  TempSrc:      SpO2: 97% 96% 90%   Weight:      Height:       Physical Exam Constitutional:      Appearance: He is normal weight.  HENT:     Head: Normocephalic and atraumatic.     Nose: Nose normal.     Mouth/Throat:     Mouth: Mucous membranes are moist.  Cardiovascular:     Rate and Rhythm: Normal rate and regular rhythm.  Pulmonary:     Effort: Pulmonary effort is normal.  Abdominal:     General: Bowel sounds are normal.  Musculoskeletal:     Comments: Noted contractures of bilateral upper extremities  R foot wound on lateral mid foot     Neurological:     General: No focal deficit present.  Psychiatric:        Mood and Affect: Mood normal.     Data Reviewed:  There are no new results to review at this time. DG Foot 2 Views Right CLINICAL DATA:  Evaluate for osteomyelitis. Complains of right foot wound located on outside region of foot.  EXAM: RIGHT FOOT - 2 VIEW  COMPARISON:  None Available.  FINDINGS: Status post first ray amputation at the head of the first proximal phalanx, third ray amputation at the PIP joint, and fifth ray amputation at  the metatarsal phalangeal joint. Mild diffuse soft tissue edema. Superficial soft tissue wound with subcutaneous gas is noted adjacent to the base of the fifth metatarsal bone. There is no underlying fracture, dislocation or bony erosion.  IMPRESSION: 1. Superficial soft tissue wound with subcutaneous gas adjacent to the base of the fifth metatarsal bone. 2. No radiographic evidence for osteomyelitis. 3. Status post first, third and fifth ray amputations.  Electronically Signed   By: Signa Kell M.D.   On: 01/21/2023 05:57  Lab Results  Component Value Date   WBC 8.1 01/21/2023   HGB 10.2 (L) 01/21/2023   HCT 32.4 (L) 01/21/2023   MCV 93.4 01/21/2023   PLT 150 01/21/2023   Last metabolic panel Lab Results  Component Value Date   GLUCOSE 91 01/21/2023   NA 140 01/21/2023   K 3.8 01/21/2023   CL 109 01/21/2023   CO2 21 (L) 01/21/2023   BUN 31 (H) 01/21/2023   CREATININE 7.66 (H) 01/21/2023   GFRNONAA 8 (L) 01/21/2023   CALCIUM 5.2 (LL) 01/21/2023   PHOS 2.6 01/21/2023   PROT 5.6 (L) 01/21/2023   ALBUMIN 2.7 (L) 01/21/2023   BILITOT 1.5 (H) 01/21/2023   ALKPHOS 34 (L) 01/21/2023   AST 26 01/21/2023   ALT 10 01/21/2023   ANIONGAP 10 01/21/2023    Assessment and Plan: * Cellulitis Right lateral foot cellulitis in setting of end-stage renal disease with prior amputations in the past Right foot plain films with noted superficial wound and concern for gas around base of metatarsal joint-will check MRI of the foot to correlate IV vancomycin, cefepime, Flagyl for infectious coverage Blood cultures Follow up imaging    Type 2 diabetes mellitus with peripheral neuropathy (HCC) SSI  A1C   ESRD on dialysis (HCC) Baseline ESRD on HD TTS  No reported missed episodes of HD  Nephrology consult while in house    Polyneuropathy, unspecified Cont lyrica   GERD (gastroesophageal reflux disease) PPI       Advance Care Planning:   Code Status: Full Code    Consults: nephrology   Family Communication: Family at the bedside-sleeping  Severity of  Illness: The appropriate patient status for this patient is INPATIENT. Inpatient status is judged to be reasonable and necessary in order to provide the required intensity of service to ensure the patient's safety. The patient's presenting symptoms, physical exam findings, and initial radiographic and laboratory data in the context of their chronic comorbidities is felt to place them at high risk for further clinical deterioration. Furthermore, it is not anticipated that the patient will be medically stable for discharge from the hospital within 2 midnights of admission.   * I certify that at the point of admission it is my clinical judgment that the patient will require inpatient hospital care spanning beyond 2 midnights from the point of admission due to high intensity of service, high risk for further deterioration and high frequency of surveillance required.*  Author: Floydene Flock, MD 01/21/2023 9:03 AM  For on call review [email or web address].

## 2023-01-21 NOTE — ED Notes (Addendum)
Crtitical CA of 5.2 from lab.  EDP Ward notified.

## 2023-01-21 NOTE — ED Notes (Signed)
The pt was lying supine in the bed with his head slightly elevated. The pt was warm, pink, and dry. The pt was alert and oriented x 4. The pt denied needing any assistance.

## 2023-01-21 NOTE — Progress Notes (Signed)
[facility]  ROUNDING NOTE   Subjective:   Patient seen and evaluated at bedside. Developed ulceration on his right foot. Has had increasing pain and drainage. Probable osteomyelitis in the foot.  Objective:  Vital signs in last 24 hours:  Temp:  [97.9 F (36.6 C)-99.4 F (37.4 C)] 98 F (36.7 C) (09/29 1351) Pulse Rate:  [70-90] 78 (09/29 1351) Resp:  [11-18] 18 (09/29 1351) BP: (80-114)/(55-73) 99/67 (09/29 1351) SpO2:  [90 %-98 %] 96 % (09/29 1351) Weight:  [433 kg] 119 kg (09/29 0423)  Weight change:  Filed Weights   01/21/23 0423  Weight: 119 kg    Intake/Output: No intake/output data recorded.   Intake/Output this shift:  No intake/output data recorded.  Physical Exam: General: No acute distress  Head: Normocephalic, atraumatic. Moist oral mucosal membranes  Neck: Supple  Lungs:  Clear to auscultation, normal effort  Heart: S1S2 no rubs  Abdomen:  Soft, nontender, bowel sounds present  Extremities: Trace peripheral edema.  Neurologic: Awake, alert, following commands  Skin: No acute rash  Access: Right IJ PermCath    Basic Metabolic Panel: Recent Labs  Lab 01/21/23 0451  NA 140  K 3.8  CL 109  CO2 21*  GLUCOSE 91  BUN 31*  CREATININE 7.66*  CALCIUM 5.2*  MG 1.6*  PHOS 2.6    Liver Function Tests: Recent Labs  Lab 01/21/23 0451  AST 26  ALT 10  ALKPHOS 34*  BILITOT 1.5*  PROT 5.6*  ALBUMIN 2.7*   No results for input(s): "LIPASE", "AMYLASE" in the last 168 hours. No results for input(s): "AMMONIA" in the last 168 hours.  CBC: Recent Labs  Lab 01/21/23 0451  WBC 8.1  NEUTROABS 6.2  HGB 10.2*  HCT 32.4*  MCV 93.4  PLT 150    Cardiac Enzymes: No results for input(s): "CKTOTAL", "CKMB", "CKMBINDEX", "TROPONINI" in the last 168 hours.  BNP: Invalid input(s): "POCBNP"  CBG: Recent Labs  Lab 01/21/23 1126  GLUCAP 120*    Microbiology: Results for orders placed or performed during the hospital encounter  of 01/21/23  Blood Culture (routine x 2)     Status: None (Preliminary result)   Collection Time: 01/21/23  5:08 AM   Specimen: BLOOD  Result Value Ref Range Status   Specimen Description BLOOD BLOOD RIGHT ARM  Final   Special Requests   Final    BOTTLES DRAWN AEROBIC AND ANAEROBIC Blood Culture results may not be optimal due to an inadequate volume of blood received in culture bottles   Culture   Final    NO GROWTH < 12 HOURS Performed at [facility], [address], [place], [place] [address]    Report Status PENDING  Incomplete  Blood Culture (routine x 2)     Status: None (Preliminary result)   Collection Time: 01/21/23  5:31 AM   Specimen: BLOOD  Result Value Ref Range Status   Specimen Description BLOOD BLOOD LEFT ARM  Final   Special Requests   Final    BOTTLES DRAWN AEROBIC AND ANAEROBIC Blood Culture results may not be optimal due to an inadequate volume of blood received in culture bottles   Culture   Final    NO GROWTH < 12 HOURS Performed at [facility], [address] [place], [place] [address]    Report Status PENDING  Incomplete    Coagulation Studies: Recent Labs    01/21/23 0451  LABPROT 15.5*  INR 1.2    Urinalysis: No results  for input(s): "COLORURINE", "LABSPEC", "PHURINE", "GLUCOSEU", "HGBUR", "BILIRUBINUR", "KETONESUR", "PROTEINUR", "UROBILINOGEN", "NITRITE", "LEUKOCYTESUR" in the last 72 hours.  Invalid input(s): "APPERANCEUR"    Imaging: MR FOOT RIGHT WO CONTRAST  Result Date: 01/21/2023 CLINICAL DATA:  Lateral foot wound. Soft tissue infection suspected. History of end-stage renal disease with foot pain, erythema and low level fever for 1 month. EXAM: MRI OF THE RIGHT FOREFOOT WITHOUT CONTRAST TECHNIQUE: Multiplanar, multisequence MR imaging of the right forefoot was performed. No intravenous contrast was administered. COMPARISON:  Radiographs 01/21/2023.  No other comparison studies. FINDINGS: Bones/Joint/Cartilage As seen  on the earlier radiographs, there is soft tissue ulceration lateral to the base of the 5th metatarsal. There is underlying marrow T2 hyperintensity and T1 hypointensity laterally in the base of the 5th metatarsal, suspicious for osteomyelitis. This area is included on the coronal and sagittal images, but not fully covered on the transverse axial images. No other evidence of osteomyelitis within the forefoot. There are postsurgical changes from previous amputations through the head of the 1st proximal phalanx, at the 3rd proximal interphalangeal joint and at the 5th metatarsophalangeal joint. No significant joint effusions. Ligaments Intact Lisfranc ligament. The collateral ligaments of the remaining metatarsophalangeal joints appear intact. Muscles and Tendons Generalized forefoot muscular atrophy without focal fluid collection or significant tenosynovitis. Soft tissues As above, soft tissue ulceration lateral to the base of the 5th metatarsal with surrounding skin thickening and subcutaneous edema. No focal fluid collection is demonstrated. There is mild generalized subcutaneous edema throughout the dorsal and lateral aspect of the forefoot. IMPRESSION: 1. Soft tissue ulceration lateral to the base of the 5th metatarsal with underlying marrow changes in the base of the 5th metatarsal suspicious for osteomyelitis. 2. No evidence of soft tissue abscess. 3. Postsurgical changes as described. Electronically Signed   By: Carey Bullocks M.D.   On: 01/21/2023 11:28   DG Foot 2 Views Right  Result Date: 01/21/2023 CLINICAL DATA:  Evaluate for osteomyelitis. Complains of right foot wound located on outside region of foot. EXAM: RIGHT FOOT - 2 VIEW COMPARISON:  None Available. FINDINGS: Status post first ray amputation at the head of the first proximal phalanx, third ray amputation at the PIP joint, and fifth ray amputation at the metatarsal phalangeal joint. Mild diffuse soft tissue edema. Superficial soft tissue wound  with subcutaneous gas is noted adjacent to the base of the fifth metatarsal bone. There is no underlying fracture, dislocation or bony erosion. IMPRESSION: 1. Superficial soft tissue wound with subcutaneous gas adjacent to the base of the fifth metatarsal bone. 2. No radiographic evidence for osteomyelitis. 3. Status post first, third and fifth ray amputations. Electronically Signed   By: Signa Kell M.D.   On: 01/21/2023 05:57     Medications:    sodium chloride     [START ON 01/22/2023] ceFEPime (MAXIPIME) IV     metronidazole     [START ON 01/23/2023] vancomycin      heparin  5,000 Units Subcutaneous Q8H   insulin aspart  0-6 Units Subcutaneous TID WC   sodium chloride flush  3 mL Intravenous Q12H   sodium chloride, acetaminophen, HYDROmorphone (DILAUDID) injection, oxyCODONE-acetaminophen **AND** oxyCODONE, sodium chloride flush  Assessment/ Plan:  47 y.o. male ESRD on HD TTS, CHF, history of Guillain-Barr syndrome, hypertension, anemia of chronic kidney disease, secondary hyperparathyroidism, history of right foot toe amputation who presents with ulceration on lateral aspect of right foot.  [facility] [address]  1.  ESRD on HD TTS.  Patient states that  he underwent hemodialysis treatment yesterday.  Tolerated well.  No acute indication for dialysis today.  Next planned dialysis is for Tuesday.  2.  Anemia of chronic kidney disease.   Lab Results  Component Value Date   HGB 10.2 (L) 01/21/2023   Patient receives Mircera as outpatient.  Hold off on ESA's for now.  3.  Secondary hyperparathyroidism.  Monitor bone metabolism parameters over the course of the hospitalization.  4.  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  Currently on vancomycin and metronidazole.  Consider podiatry referral as well.     LOS: 0 Patrick Downs 9/29/20243:07 PM

## 2023-01-21 NOTE — Assessment & Plan Note (Signed)
Cont lyrica

## 2023-01-21 NOTE — Progress Notes (Signed)
CODE SEPSIS - PHARMACY COMMUNICATION  **Broad Spectrum Antibiotics should be administered within 1 hour of Sepsis diagnosis**  Time Code Sepsis Called/Page Received: 9/29 @ 0439   Antibiotics Ordered: Vanc, cefepime , metronidazole   Time of 1st antibiotic administration: cefepime 2 gm IV X 1 on 9/29 @ 0533  Additional action taken by pharmacy:   If necessary, Name of Provider/Nurse Contacted:     Mackenzee Becvar D ,PharmD Clinical Pharmacist  01/21/2023  6:51 AM

## 2023-01-21 NOTE — ED Provider Notes (Signed)
[facility] Provider Note    Event Date/Time   First MD Initiated Contact with Patient 01/21/23 [phone]     (approximate)   History   Foot Wound   HPI  Patrick Downs is a 47 y.o. male with history of end-stage renal disease on hemodialysis, CHF, hypertension who presents to the emergency department with a wound to the right lateral foot that has been present for about a month.  States he is concerned it is infected because he is now having pain going up his leg as well as redness and warmth.  No known fevers but does have a temperature here of 99.4.  No missed dialysis.  No history of diabetes.  States started having a foul-smelling odor about a week ago.  He has not noticed any purulent drainage.   History provided by patient, family.    Past Medical History:  Diagnosis Date   CHF (congestive heart failure) (HCC)    Degenerative disc disease, lumbar    Depression    Dialysis patient (HCC)    ESRD (end stage renal disease) on dialysis (HCC)    Failure to thrive in adult    GERD (gastroesophageal reflux disease)    Gout    Guillain Barr syndrome (HCC)    Guillain Barr syndrome (HCC)    Heart murmur    Hip pain, chronic, left 12/24/2020   HTN (hypertension)    Hyperparathyroidism (HCC)    Kidney failure    MRSA (methicillin resistant staph aureus) culture positive    Peripheral neuropathy    Pneumonia    Renal insufficiency    Respiratory failure [facility])     Past Surgical History:  Procedure Laterality Date   A/V FISTULAGRAM Right 01/07/2018   Procedure: A/V FISTULAGRAM;  Surgeon: Annice Needy, MD;  Location: [facility];  Service: Cardiovascular;  Laterality: Right;   AMPUTATION TOE Right 06/08/2017   Procedure: AMPUTATION TOE RIGHT FIFTH TOE;  Surgeon: Gwyneth Revels, DPM;  Location: [facility];  Service: Podiatry;  Laterality: Right;   AMPUTATION TOE Left 05/17/2018   Procedure: RAY LEFT 5TH;  Surgeon: Gwyneth Revels, DPM;  Location:  [facility];  Service: Podiatry;  Laterality: Left;   AMPUTATION TOE Right 03/05/2020   Procedure: AMPUTATION TOE IPJ X 2 RIGHT GREAT AND 3RD;  Surgeon: Gwyneth Revels, DPM;  Location: [facility];  Service: Podiatry;  Laterality: Right;   AV FISTULA PLACEMENT     x5      2 graphs   AV FISTULA PLACEMENT Right 04/08/2020   Procedure: INSERTION OF ARTERIOVENOUS (AV) GORE-TEX GRAFT ARM ( ARTEGRAFT);  Surgeon: Annice Needy, MD;  Location: [facility];  Service: Vascular;  Laterality: Right;   DIALYSIS/PERMA CATHETER INSERTION N/A 01/20/2019   Procedure: DIALYSIS/PERMA CATHETER INSERTION;  Surgeon: Annice Needy, MD;  Location: [facility];  Service: Cardiovascular;  Laterality: N/A;   DIALYSIS/PERMA CATHETER REMOVAL N/A 06/17/2018   Procedure: DIALYSIS/PERMA CATHETER REMOVAL;  Surgeon: Annice Needy, MD;  Location: [facility];  Service: Cardiovascular;  Laterality: N/A;   PARATHYROIDECTOMY     PERIPHERAL VASCULAR THROMBECTOMY Right 01/22/2019   Procedure: PERIPHERAL VASCULAR THROMBECTOMY;  Surgeon: Annice Needy, MD;  Location: [facility];  Service: Cardiovascular;  Laterality: Right;   REMOVAL OF GRAFT Right 05/16/2020   Procedure: REMOVAL OF GRAFT;  Surgeon: Fransisco Hertz, MD;  Location: [facility];  Service: Vascular;  Laterality: Right;   RENAL BIOPSY  REVISON OF ARTERIOVENOUS FISTULA Right 02/07/2018   Procedure: REVISON OF ARTERIOVENOUS FISTULA;  Surgeon: Annice Needy, MD;  Location: [facility];  Service: Vascular;  Laterality: Right;   TEE WITHOUT CARDIOVERSION N/A 01/22/2018   Procedure: TRANSESOPHAGEAL ECHOCARDIOGRAM (TEE);  Surgeon: Laurier Nancy, MD;  Location: [facility];  Service: Cardiovascular;  Laterality: N/A;   tonsiilectomy     TONSILLECTOMY     tracheotomy     UPPER EXTREMITY VENOGRAPHY Bilateral 02/23/2020   Procedure: UPPER EXTREMITY VENOGRAPHY;  Surgeon: Annice Needy, MD;  Location: [facility];  Service: Cardiovascular;  Laterality: Bilateral;     MEDICATIONS:  Prior to Admission medications   Medication Sig Start Date End Date Taking? Authorizing Provider  acetaminophen (TYLENOL) 500 MG tablet Take 1,000-1,500 mg by mouth 2 (two) times daily as needed for moderate pain or headache.    [provider]  AURYXIA 1 GM 210 MG(Fe) tablet Take 420 mg by mouth 3 (three) times daily. 11/28/19   [provider]  calcium acetate (PHOSLO) 667 MG capsule Take 2,001 mg by mouth 3 (three) times daily. 08/20/21   [provider]  hydrOXYzine (ATARAX) 25 MG tablet Take 1 tablet by mouth 2 (two) times daily. 12/02/20   [provider]  oxyCODONE-acetaminophen (PERCOCET) 10-325 MG tablet Take 1 tablet by mouth every 4 (four) hours as needed for pain. 12/23/22 01/22/23  Yevette Edwards, MD  oxyCODONE-acetaminophen (PERCOCET) 10-325 MG tablet Take 1 tablet by mouth every 4 (four) hours as needed for pain. 01/22/23 02/21/23  Yevette Edwards, MD  pregabalin (LYRICA) 25 MG capsule Take by mouth.    [provider]  sildenafil (VIAGRA) 100 MG tablet Take 100 mg by mouth daily as needed for erectile dysfunction. 08/20/21   [provider]    Physical Exam   Triage Vital Signs: ED Triage Vitals  Encounter Vitals Group     BP 01/21/23 0422 95/64     Systolic BP Percentile --      Diastolic BP Percentile --      Pulse Rate 01/21/23 0422 90     Resp 01/21/23 0422 18     Temp 01/21/23 0422 99.4 F (37.4 C)     Temp Source 01/21/23 0422 Oral     SpO2 01/21/23 0422 98 %     Weight 01/21/23 0423 262 lb 5.6 oz (119 kg)     Height 01/21/23 0423 6\' 2"  (1.88 m)     Head Circumference --      Peak Flow --      Pain Score 01/21/23 0421 10     Pain Loc --      Pain Education --      Exclude from Growth Chart --     Most recent vital signs: Vitals:   01/21/23 0440 01/21/23 0553  BP: (!) 80/56 (!) 101/55  Pulse: 88 83  Resp: 13 12  Temp:    SpO2: 94% 97%    CONSTITUTIONAL: Alert, responds appropriately to  questions.  Chronically ill-appearing HEAD: Normocephalic, atraumatic EYES: Conjunctivae clear, pupils appear equal, sclera nonicteric ENT: normal nose; moist mucous membranes NECK: Supple, normal ROM CARD: RRR; S1 and S2 appreciated RESP: Normal chest excursion without splinting or tachypnea; breath sounds clear and equal bilaterally; no wheezes, no rhonchi, no rales, no hypoxia or respiratory distress, speaking full sentences ABD/GI: Non-distended; soft, non-tender, no rebound, no guarding, no peritoneal signs BACK: The back appears normal EXT: Patient has an ulcerated lesion to the  lateral right foot with foul odor but no active drainage or bleeding.  He has redness and warmth that goes up the dorsal foot into the shin of most of the knee.  No calf tenderness or calf swelling.  He has a 2+ DP pulse.  He has had previous amputations of the first, third and fifth toes on the right foot. SKIN: Normal color for age and race; warm; no rash on exposed skin NEURO: Moves all extremities equally, normal speech PSYCH: The patient's mood and manner are appropriate.      RIGHT foot   Patient gave verbal permission to utilize photo for medical documentation only. The image was not stored on any personal device.  ED Results / Procedures / Treatments   LABS: (all labs ordered are listed, but only abnormal results are displayed) Labs Reviewed  COMPREHENSIVE METABOLIC PANEL - Abnormal; Notable for the following components:      Result Value   CO2 21 (*)    BUN 31 (*)    Creatinine, Ser 7.66 (*)    Calcium 5.2 (*)    Total Protein 5.6 (*)    Albumin 2.7 (*)    Alkaline Phosphatase 34 (*)    Total Bilirubin 1.5 (*)    GFR, Estimated 8 (*)    All other components within normal limits  CBC WITH DIFFERENTIAL/PLATELET - Abnormal; Notable for the following components:   RBC 3.47 (*)    Hemoglobin 10.2 (*)    HCT 32.4 (*)    All other components within normal limits  PROTIME-INR - Abnormal;  Notable for the following components:   Prothrombin Time 15.5 (*)    All other components within normal limits  APTT - Abnormal; Notable for the following components:   aPTT 47 (*)    All other components within normal limits  CULTURE, BLOOD (ROUTINE X 2)  CULTURE, BLOOD (ROUTINE X 2)  LACTIC ACID, PLASMA  PROCALCITONIN  MAGNESIUM  PHOSPHORUS     EKG:  EKG Interpretation Date/Time:  Sunday January 21 2023 04:43:32 EDT Ventricular Rate:  88 PR Interval:  176 QRS Duration:  97 QT Interval:  368 QTC Calculation: 446 R Axis:   53  Text Interpretation: Sinus rhythm Inferior infarct, age indeterminate Confirmed by Rochele Raring [phone]) on 01/21/2023 4:45:29 AM         RADIOLOGY: My personal review and interpretation of imaging: X-ray shows no osteomyelitis.  I have personally reviewed all radiology reports.   DG Foot 2 Views Right  Result Date: 01/21/2023 CLINICAL DATA:  Evaluate for osteomyelitis. Complains of right foot wound located on outside region of foot. EXAM: RIGHT FOOT - 2 VIEW COMPARISON:  None Available. FINDINGS: Status post first ray amputation at the head of the first proximal phalanx, third ray amputation at the PIP joint, and fifth ray amputation at the metatarsal phalangeal joint. Mild diffuse soft tissue edema. Superficial soft tissue wound with subcutaneous gas is noted adjacent to the base of the fifth metatarsal bone. There is no underlying fracture, dislocation or bony erosion. IMPRESSION: 1. Superficial soft tissue wound with subcutaneous gas adjacent to the base of the fifth metatarsal bone. 2. No radiographic evidence for osteomyelitis. 3. Status post first, third and fifth ray amputations. Electronically Signed   By: Signa Kell M.D.   On: 01/21/2023 05:57     PROCEDURES:  Critical Care performed: Yes, see critical care procedure note(s)   CRITICAL CARE Performed by: Baxter Hire Toniann Dickerson   Total critical care time: 45 minutes  Critical care time  was exclusive of separately billable procedures and treating other patients.  Critical care was necessary to treat or prevent imminent or life-threatening deterioration.  Critical care was time spent personally by me on the following activities: development of treatment plan with patient and/or surrogate as well as nursing, discussions with consultants, evaluation of patient's response to treatment, examination of patient, obtaining history from patient or surrogate, ordering and performing treatments and interventions, ordering and review of laboratory studies, ordering and review of radiographic studies, pulse oximetry and re-evaluation of patient's condition.   Marland Kitchen1-3 Lead EKG Interpretation  Performed by: Corie Allis, Layla Maw, DO Authorized by: Tabby Beaston, Layla Maw, DO     Interpretation: normal     ECG rate:  90   ECG rate assessment: normal     Rhythm: sinus rhythm     Ectopy: none     Conduction: normal       IMPRESSION / MDM / ASSESSMENT AND PLAN / ED COURSE  I reviewed the triage vital signs and the nursing notes.    Patient has right foot ulcer and now cellulitis going up the right lower extremity.  The patient is on the cardiac monitor to evaluate for evidence of arrhythmia and/or significant heart rate changes.   DIFFERENTIAL DIAGNOSIS (includes but not limited to):   Foot ulcer, osteomyelitis, cellulitis, sepsis   Patient's presentation is most consistent with acute presentation with potential threat to life or bodily function.   PLAN: Will obtain labs, cultures, x-ray of the right foot.  Will start antibiotics and IV hydration.  Anticipate admission.  Will give pain medication.   MEDICATIONS GIVEN IN ED: Medications  lactated ringers bolus 500 mL (0 mLs Intravenous Paused 01/21/23 0533)  ceFEPIme (MAXIPIME) 2 g in sodium chloride 0.9 % 100 mL IVPB (2 g Intravenous New Bag/Given 01/21/23 0533)  metroNIDAZOLE (FLAGYL) IVPB 500 mg (500 mg Intravenous New Bag/Given 01/21/23  0546)  vancomycin (VANCOCIN) IVPB 1000 mg/200 mL premix (1,000 mg Intravenous New Bag/Given 01/21/23 0549)    Followed by  vancomycin (VANCOREADY) IVPB 1500 mg/300 mL (has no administration in time range)  sodium chloride 0.9 % bolus 500 mL (has no administration in time range)  calcium gluconate 2 g/ 100 mL sodium chloride IVPB (has no administration in time range)  fentaNYL (SUBLIMAZE) injection 50 mcg (50 mcg Intravenous Given 01/21/23 0509)     ED COURSE: Patient has had some blood pressures documented in the 90s systolic.  He states this is not uncommon for him and on review of records in April 2024 he had pressures in the 90s.  He is getting gentle hydration.  Lactic is normal.  There was 1 blood pressure documented in the 80s systolic however I think this was erroneous as immediately afterwards it was rechecked it was 101/55.  Will continue to closely monitor for further episodes of hypotension as he may need additional IV fluids.  Patient has no leukocytosis or leukopenia.  Calcium level is low at 5.2.  Corrected calcium based on hypoalbuminemia is 6.2.  This looks like a chronic issue for patient.  He is on the cardiac monitor.  No EKG changes.  Will give IV calcium replacement.  X-ray reviewed and interpreted by myself and the radiologist and shows no bony destruction.   CONSULTS:  Consulted and discussed patient's case with hospitalist, Dr. Para March.  I have recommended admission and consulting physician agrees and will place admission orders.  Patient (and family if present) agree with this plan.  I reviewed all nursing notes, vitals, pertinent previous records.  All labs, EKGs, imaging ordered have been independently reviewed and interpreted by myself.    OUTSIDE RECORDS REVIEWED: Reviewed last office visit on 09/11/2022 with Dr. Sampson Goon.       FINAL CLINICAL IMPRESSION(S) / ED DIAGNOSES   Final diagnoses:  Ulcer of right foot, unspecified ulcer stage (HCC)   Cellulitis of right lower extremity  Hypocalcemia     Rx / DC Orders   ED Discharge Orders     None        Note:  This document was prepared using Dragon voice recognition software and may include unintentional dictation errors.   Fumio Vandam, Layla Maw, DO 01/21/23 [phone]

## 2023-01-21 NOTE — Assessment & Plan Note (Signed)
SSI  A1C  

## 2023-01-21 NOTE — ED Triage Notes (Signed)
Patient arrives from home  due a right foot wound located on the outside area of the foot. Patient states that he wears braces on both legs/feet.Patient states that he noticed that his right leg was swelling more than normal and can no longer fit in his brace. In addition, patient c/o odor, pain, and drainage from the right foot. Patient placed in a wheelchair prior to triage due to unsteady gait. Patient is aox4. Nad noted. Resp even and unlabored. Skin pwd.   Patient has a small open wound noted to the lateral right foot. Malodorous. Drainage with brown tint. Redness noted to the peri wound. Swelling noted to the right foot and noted to the leg.

## 2023-01-21 NOTE — Assessment & Plan Note (Signed)
Baseline ESRD on HD TTS  No reported missed episodes of HD  Nephrology consult while in house

## 2023-01-21 NOTE — Plan of Care (Signed)

## 2023-01-21 NOTE — Assessment & Plan Note (Signed)
PPI ?

## 2023-01-21 NOTE — Assessment & Plan Note (Addendum)
Infected diabetic foot ulcer Osteomyelitis Right lateral foot cellulitis in setting of end-stage renal disease with prior amputations in the past Right foot plain films with noted superficial wound and concern for gas around base of metatarsal joint-will check MRI of the foot to correlate IV vancomycin, cefepime, Flagyl for infectious coverage Blood cultures Follow up imaging

## 2023-01-21 NOTE — Consult Note (Signed)
Pharmacy Antibiotic Note  Patrick Downs is a 47 y.o. male admitted on 01/21/2023 with  Wound infection .  Pharmacy has been consulted for cefepime and vancomycin dosing. Afeb, WBC 8.1. PMH with ESRD, HTN, gout, DM, prior toe amputation. Patient reports ulcer formation on the right lateral foot. Hx of MSSA infection in 2022.   Plan: Pt received cefepime 2 g IV x 1. Will order cefepime 1 mg daily  Pt received loading dose of vancomycin 2500 mg x 1. Will order vancomycin 1000 mg on HD days. Plan to obtain vancomycin level prior to the 3rd HD session.   Height: 6\' 2"  (188 cm) Weight: 119 kg (262 lb 5.6 oz) IBW/kg (Calculated) : 82.2  Temp (24hrs), Avg:98.7 F (37.1 C), Min:97.9 F (36.6 C), Max:99.4 F (37.4 C)  Recent Labs  Lab 01/21/23 0451  WBC 8.1  CREATININE 7.66*  LATICACIDVEN 1.5    Estimated Creatinine Clearance: 16.5 mL/min (A) (by C-G formula based on SCr of 7.66 mg/dL (H)).    Allergies  Allergen Reactions   Hepatitis B Vaccine     Other reaction(s): Unknown   Ondansetron Other (See Comments)    Stomach pain    Minoxidil Other (See Comments)    "put fluid around my heart", PERICARDIAL EFFUSION   Morphine And Codeine Other (See Comments)    Aggressive    Omnipaque [Iohexol] Itching and Other (See Comments)    Rigors on one occasion, widespread itching on a separate occasion (resolved with Benadryl), tremors    Antimicrobials this admission: 9/29 cefepime >>  9/29 vancomycin >>   Dose adjustments this admission: None  Microbiology results: 9/29 BCx: pending   Thank you for allowing pharmacy to be a part of this patient's care.  Ronnald Ramp, PharmD, BCPS 01/21/2023 10:29 AM

## 2023-01-21 NOTE — Progress Notes (Signed)
PHARMACY -  BRIEF ANTIBIOTIC NOTE   Pharmacy has received consult(s) for Vanc, Cefepime from an ED provider.  The patient's profile has been reviewed for ht/wt/allergies/indication/available labs.    One time order(s) placed for Vancomycin 2500 mg IV X 1 and Cefepime 2 gm IV X 1   Further antibiotics/pharmacy consults should be ordered by admitting physician if indicated.                       Thank you, Steffani Dionisio D 01/21/2023  4:53 AM

## 2023-01-21 NOTE — Progress Notes (Signed)
Pt being followed by [facility] for Sepsis protocol. 

## 2023-01-22 DIAGNOSIS — L97519 Non-pressure chronic ulcer of other part of right foot with unspecified severity: Secondary | ICD-10-CM

## 2023-01-22 DIAGNOSIS — L03115 Cellulitis of right lower limb: Secondary | ICD-10-CM | POA: Diagnosis not present

## 2023-01-22 LAB — GLUCOSE, CAPILLARY
Glucose-Capillary: 99 mg/dL (ref 70–99)
Glucose-Capillary: 106 mg/dL — ABNORMAL HIGH (ref 70–99)
Glucose-Capillary: 85 mg/dL (ref 70–99)
Glucose-Capillary: 113 mg/dL — ABNORMAL HIGH (ref 70–99)

## 2023-01-22 LAB — COMPREHENSIVE METABOLIC PANEL
ALT: 14 U/L (ref 0–44)
AST: 12 U/L — ABNORMAL LOW (ref 15–41)
Albumin: 3.1 g/dL — ABNORMAL LOW (ref 3.5–5.0)
Alkaline Phosphatase: 47 U/L (ref 38–126)
Anion gap: 15 (ref 5–15)
BUN: 56 mg/dL — ABNORMAL HIGH (ref 6–20)
CO2: 23 mmol/L (ref 22–32)
Calcium: 6.7 mg/dL — ABNORMAL LOW (ref 8.9–10.3)
Chloride: 97 mmol/L — ABNORMAL LOW (ref 98–111)
Creatinine, Ser: 13.12 mg/dL — ABNORMAL HIGH (ref 0.61–1.24)
GFR, Estimated: 4 mL/min — ABNORMAL LOW (ref >60)
Glucose, Bld: 111 mg/dL — ABNORMAL HIGH (ref 70–99)
Potassium: 4.7 mmol/L (ref 3.5–5.1)
Sodium: 135 mmol/L (ref 135–145)
Total Bilirubin: 0.9 mg/dL (ref 0.3–1.2)
Total Protein: 6.7 g/dL (ref 6.5–8.1)

## 2023-01-22 LAB — CBC
HCT: 30.7 % — ABNORMAL LOW (ref 39.0–52.0)
Hemoglobin: 10 g/dL — ABNORMAL LOW (ref 13.0–17.0)
MCH: 29.5 pg (ref 26.0–34.0)
MCHC: 32.6 g/dL (ref 30.0–36.0)
MCV: 90.6 fL (ref 80.0–100.0)
Platelets: 137 10*3/uL — ABNORMAL LOW (ref 150–400)
RBC: 3.39 MIL/uL — ABNORMAL LOW (ref 4.22–5.81)
RDW: 14.6 % (ref 11.5–15.5)
WBC: 5.5 10*3/uL (ref 4.0–10.5)
nRBC: 0 % (ref 0.0–0.2)

## 2023-01-22 LAB — HIV ANTIBODY (ROUTINE TESTING W REFLEX): HIV Screen 4th Generation wRfx: NONREACTIVE

## 2023-01-22 MED ORDER — ZINC SULFATE 220 (50 ZN) MG PO CAPS
220.0000 mg | ORAL_CAPSULE | Freq: Every day | ORAL | Status: DC
  Administered 2023-01-22 – 2023-01-30 (×8): 220 mg via ORAL
  Filled 2023-01-22 (×9): qty 1

## 2023-01-22 MED ORDER — VITAMIN C 500 MG PO TABS
500.0000 mg | ORAL_TABLET | Freq: Two times a day (BID) | ORAL | Status: DC
  Administered 2023-01-22 – 2023-01-30 (×15): 500 mg via ORAL
  Filled 2023-01-22 (×15): qty 1

## 2023-01-22 MED ORDER — RENA-VITE PO TABS
1.0000 | ORAL_TABLET | Freq: Every day | ORAL | Status: DC
  Administered 2023-01-22 – 2023-01-29 (×8): 1 via ORAL
  Filled 2023-01-22 (×9): qty 1

## 2023-01-22 MED ORDER — PROSOURCE PLUS PO LIQD
30.0000 mL | Freq: Three times a day (TID) | ORAL | Status: DC
  Administered 2023-01-22 – 2023-01-30 (×13): 30 mL via ORAL
  Filled 2023-01-22 (×23): qty 30

## 2023-01-22 MED ORDER — JUVEN PO PACK
1.0000 | PACK | Freq: Two times a day (BID) | ORAL | Status: DC
  Administered 2023-01-26: 1 via ORAL

## 2023-01-22 NOTE — Progress Notes (Signed)
[facility]  ROUNDING NOTE   Subjective:   Patient seen sitting in bed, family at bedside Alert and oriented Denies pain from right foot  Dialysis scheduled for tomorrow  Objective:  Vital signs in last 24 hours:  Temp:  [97.6 F (36.4 C)-98.5 F (36.9 C)] 97.6 F (36.4 C) (09/30 0939) Pulse Rate:  [71-80] 71 (09/30 0939) Resp:  [18] 18 (09/30 0939) BP: (89-108)/(60-69) 93/61 (09/30 0939) SpO2:  [96 %-99 %] 99 % (09/30 0939)  Weight change:  Filed Weights   01/21/23 0423  Weight: 119 kg    Intake/Output: I/O last 3 completed shifts: In: 100 [IV Piggyback:100] Out: -    Intake/Output this shift:  Total I/O In: 120 [P.O.:120] Out: -   Physical Exam: General: No acute distress  Head: Normocephalic, atraumatic. Moist oral mucosal membranes  Neck: Supple  Lungs:  Clear to auscultation, normal effort  Heart: S1S2 no rubs  Abdomen:  Soft, nontender, bowel sounds present  Extremities: Trace peripheral edema.  Neurologic: Awake, alert, following commands  Skin: No acute rash, right foot wound  Access: Right IJ PermCath    Basic Metabolic Panel: Recent Labs  Lab 01/21/23 0451 01/22/23 0501  NA 140 135  K 3.8 4.7  CL 109 97*  CO2 21* 23  GLUCOSE 91 111*  BUN 31* 56*  CREATININE 7.66* 13.12*  CALCIUM 5.2* 6.7*  MG 1.6*  --   PHOS 2.6  --     Liver Function Tests: Recent Labs  Lab 01/21/23 0451 01/22/23 0501  AST 26 12*  ALT 10 14  ALKPHOS 34* 47  BILITOT 1.5* 0.9  PROT 5.6* 6.7  ALBUMIN 2.7* 3.1*   No results for input(s): "LIPASE", "AMYLASE" in the last 168 hours. No results for input(s): "AMMONIA" in the last 168 hours.  CBC: Recent Labs  Lab 01/21/23 0451 01/22/23 0501  WBC 8.1 5.5  NEUTROABS 6.2  --   HGB 10.2* 10.0*  HCT 32.4* 30.7*  MCV 93.4 90.6  PLT 150 137*    Cardiac Enzymes: No results for input(s): "CKTOTAL", "CKMB", "CKMBINDEX", "TROPONINI" in the last 168 hours.  BNP: Invalid input(s):  "POCBNP"  CBG: Recent Labs  Lab 01/21/23 1126 01/21/23 1647 01/21/23 2053 01/22/23 0821 01/22/23 1139  GLUCAP 120* 96 122* 99 106*    Microbiology: Results for orders placed or performed during the hospital encounter of 01/21/23  Blood Culture (routine x 2)     Status: None (Preliminary result)   Collection Time: 01/21/23  5:08 AM   Specimen: BLOOD  Result Value Ref Range Status   Specimen Description BLOOD BLOOD RIGHT ARM  Final   Special Requests   Final    BOTTLES DRAWN AEROBIC AND ANAEROBIC Blood Culture results may not be optimal due to an inadequate volume of blood received in culture bottles   Culture   Final    NO GROWTH 1 DAY Performed at [facility], [address], [place], [place] [address]    Report Status PENDING  Incomplete  Blood Culture (routine x 2)     Status: None (Preliminary result)   Collection Time: 01/21/23  5:31 AM   Specimen: BLOOD  Result Value Ref Range Status   Specimen Description BLOOD BLOOD LEFT ARM  Final   Special Requests   Final    BOTTLES DRAWN AEROBIC AND ANAEROBIC Blood Culture results may not be optimal due to an inadequate volume of blood received in culture bottles   Culture   Final  NO GROWTH 1 DAY Performed at [facility], [address] [place], [place] [address]    Report Status PENDING  Incomplete    Coagulation Studies: Recent Labs    01/21/23 0451  LABPROT 15.5*  INR 1.2    Urinalysis: No results for input(s): "COLORURINE", "LABSPEC", "PHURINE", "GLUCOSEU", "HGBUR", "BILIRUBINUR", "KETONESUR", "PROTEINUR", "UROBILINOGEN", "NITRITE", "LEUKOCYTESUR" in the last 72 hours.  Invalid input(s): "APPERANCEUR"    Imaging: MR FOOT RIGHT WO CONTRAST  Result Date: 01/21/2023 CLINICAL DATA:  Lateral foot wound. Soft tissue infection suspected. History of end-stage renal disease with foot pain, erythema and low level fever for 1 month. EXAM: MRI OF THE RIGHT FOREFOOT WITHOUT CONTRAST TECHNIQUE:  Multiplanar, multisequence MR imaging of the right forefoot was performed. No intravenous contrast was administered. COMPARISON:  Radiographs 01/21/2023.  No other comparison studies. FINDINGS: Bones/Joint/Cartilage As seen on the earlier radiographs, there is soft tissue ulceration lateral to the base of the 5th metatarsal. There is underlying marrow T2 hyperintensity and T1 hypointensity laterally in the base of the 5th metatarsal, suspicious for osteomyelitis. This area is included on the coronal and sagittal images, but not fully covered on the transverse axial images. No other evidence of osteomyelitis within the forefoot. There are postsurgical changes from previous amputations through the head of the 1st proximal phalanx, at the 3rd proximal interphalangeal joint and at the 5th metatarsophalangeal joint. No significant joint effusions. Ligaments Intact Lisfranc ligament. The collateral ligaments of the remaining metatarsophalangeal joints appear intact. Muscles and Tendons Generalized forefoot muscular atrophy without focal fluid collection or significant tenosynovitis. Soft tissues As above, soft tissue ulceration lateral to the base of the 5th metatarsal with surrounding skin thickening and subcutaneous edema. No focal fluid collection is demonstrated. There is mild generalized subcutaneous edema throughout the dorsal and lateral aspect of the forefoot. IMPRESSION: 1. Soft tissue ulceration lateral to the base of the 5th metatarsal with underlying marrow changes in the base of the 5th metatarsal suspicious for osteomyelitis. 2. No evidence of soft tissue abscess. 3. Postsurgical changes as described. Electronically Signed   By: Carey Bullocks M.D.   On: 01/21/2023 11:28   DG Foot 2 Views Right  Result Date: 01/21/2023 CLINICAL DATA:  Evaluate for osteomyelitis. Complains of right foot wound located on outside region of foot. EXAM: RIGHT FOOT - 2 VIEW COMPARISON:  None Available. FINDINGS: Status post  first ray amputation at the head of the first proximal phalanx, third ray amputation at the PIP joint, and fifth ray amputation at the metatarsal phalangeal joint. Mild diffuse soft tissue edema. Superficial soft tissue wound with subcutaneous gas is noted adjacent to the base of the fifth metatarsal bone. There is no underlying fracture, dislocation or bony erosion. IMPRESSION: 1. Superficial soft tissue wound with subcutaneous gas adjacent to the base of the fifth metatarsal bone. 2. No radiographic evidence for osteomyelitis. 3. Status post first, third and fifth ray amputations. Electronically Signed   By: Signa Kell M.D.   On: 01/21/2023 05:57     Medications:    sodium chloride     ceFEPime (MAXIPIME) IV 1 g (01/22/23 0911)   metronidazole 500 mg (01/22/23 1033)   [START ON 01/23/2023] vancomycin      Chlorhexidine Gluconate Cloth  6 each Topical Daily   heparin  5,000 Units Subcutaneous Q8H   insulin aspart  0-6 Units Subcutaneous TID WC   sodium chloride flush  3 mL Intravenous Q12H   sodium chloride, acetaminophen, hydrOXYzine, oxyCODONE-acetaminophen **AND** oxyCODONE, sodium chloride  flush  Assessment/ Plan:  47 y.o. male ESRD on HD TTS, CHF, history of Guillain-Barr syndrome, hypertension, anemia of chronic kidney disease, secondary hyperparathyroidism, history of right foot toe amputation who presents with ulceration on lateral aspect of right foot.  [facility]  1.  ESRD on HD TTS.  Next planned dialysis is for Tuesday.  2.  Anemia of chronic kidney disease.   Lab Results  Component Value Date   HGB 10.0 (L) 01/22/2023   Patient receives Mircera as outpatient.  Hemoglobin within desired range.  No need for ESA's at this time.  3.  Secondary hyperparathyroidism.  Monitor bone metabolism parameters over the course of the hospitalization.  4.  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  Currently on vancomycin and metronidazole.  Imaging suspicious for  osteomyelitis.  Consider podiatry referral as well.     LOS: 1   9/30/20242:04 PM

## 2023-01-22 NOTE — TOC Initial Note (Addendum)
Transition of Care [facility]) - Initial/Assessment Note    Patient Details  Name: Patrick Downs MRN: [id number] Date of Birth: May 21, 1975  Transition of Care [facility]) CM/SW Contact:    Allena Katz, LCSW Phone Number: 01/22/2023, 10:18 AM  Clinical Narrative:   Pt admitted from home with cellulitis. Pt reports he is active with Dr. Sampson Goon for primary care. Pt lives alone at home with his dog. Pt is active with [facility] [address] for his dialysis on Tuesday Thursday Saturday. Pt reports he drives himself to these appts. Pt reports no issues with his ADLS and does not use any assistive devices. Pt reports that he is agreeable to [facility] for his wound care and does not have a preference. No additional TOC needs at this time.                  12:29pm [facility] can take for [facility] RN.   Expected Discharge Plan: Home w Home Health Services Barriers to Discharge: Continued Medical Work up   Patient Goals and CMS Choice Patient states their goals for this hospitalization and ongoing recovery are:: Return home CMS Medicare.gov Compare Post Acute Care list provided to:: Patient Choice offered to / list presented to : Patient      Expected Discharge Plan and Services                                   HH Arranged: RN          Prior Living Arrangements/Services   Lives with:: Self   Do you feel safe going back to the place where you live?: Yes               Activities of Daily Living      Permission Sought/Granted      Share Information with NAME: Home health  Permission granted to share info w AGENCY: home health        Emotional Assessment       Orientation: : Oriented to Self, Oriented to Place, Oriented to  Time, Oriented to Situation      Admission diagnosis:  Hypocalcemia [E83.51] Cellulitis [L03.90] Cellulitis of right lower extremity [L03.115] Ulcer of right foot, unspecified ulcer stage (HCC) [L97.519] Patient Active Problem List   Diagnosis  Date Noted   Hypocalcemia 07/31/2022   V-tach (HCC) 07/31/2022   Weakness 07/31/2022   Cytopenia 07/31/2022   Troponin I above reference range 07/31/2022   Observed sleep apnea 07/17/2022   Acute bilateral low back pain without sciatica    Fluid overload 09/16/2021   Essential hypertension 09/16/2021   Acute midline low back pain without sciatica 09/16/2021   Hip pain, chronic, left 12/24/2020   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) 05/16/2020   Cellulitis 05/14/2020   Ulcer of great toe, right, with fat layer exposed (HCC) 02/13/2020   COVID-19 01/28/2020   Acute hypoxemic respiratory failure due to COVID-19 (HCC) 01/25/2020   Sepsis (HCC) 01/25/2020   Thrombocytopenia (HCC) 01/25/2020   Elevated troponin 01/25/2020   Peripheral neuropathy 01/25/2020   Other disorders of phosphorus metabolism 11/27/2019   Sepsis without acute organ dysfunction (HCC) 08/10/2019   Hypercalcemia 05/15/2019   Murmur 02/19/2019   GERD (gastroesophageal reflux disease) 03/01/2018   Degenerative disc disease, lumbar 01/09/2018   HTN (hypertension) 12/28/2017   Hyperkalemia 08/16/2017   Cellulitis of right toe 04/25/2017   Erectile dysfunction due to diseases classified elsewhere 05/31/2016  Awaiting organ transplant status 08/23/2015   Polyneuropathy, unspecified 08/02/2015   Anemia in ESRD (end-stage renal disease) (HCC) 08/02/2015   Gastroenteritis 08/02/2015   Heart failure, unspecified (HCC) 08/02/2015   Secondary hyperparathyroidism of renal origin (HCC) 08/02/2015   Bacterial infection, unspecified 06/29/2015   Unspecified open wound of right upper arm, initial encounter 06/29/2015   Encounter for fitting of gastrointestinal device 06/04/2015   Other specified local infections of the skin and subcutaneous tissue 05/20/2015   Idiopathic chronic hypotension 12/11/2014   Rectal pain 11/27/2014   Other specified diseases of anus and rectum 11/27/2014   Unspecified protein-calorie  malnutrition (HCC) 10/08/2014   Encounter for imaging study to confirm nasogastric (NG) tube placement    Encounter for nasogastric (NG) tube placement    Respiratory failure, acute (HCC)    Guillain Barr syndrome, lower ext weakness 08/27/2014   CAP (community acquired pneumonia) 08/23/2014   ESRD on dialysis (HCC) 08/23/2014   A-V fistula (HCC) 08/23/2014   ARDS (adult respiratory distress syndrome) (HCC) 08/23/2014   Septic shock (HCC) 08/23/2014   Hypotension 08/23/2014   Acute respiratory failure (HCC) 08/23/2014   Fever    Type 2 diabetes mellitus (HCC) 07/01/2014   Type 2 diabetes mellitus with peripheral neuropathy (HCC) 07/01/2014   Headache, unspecified 05/23/2014   Pruritus, unspecified 05/23/2014   Shortness of breath 05/23/2014   Iron deficiency anemia, unspecified 03/16/2014   Complication of vascular dialysis catheter 01/08/2014   Diarrhea, unspecified 01/08/2014   Pain, unspecified 01/08/2014   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, implants and grafts, sequela 01/08/2014   Anxiety disorder, unspecified 12/28/2013   Photophobia 12/11/2013   Spells 10/30/2013   Dizziness 10/30/2013   Other disorders of electrolyte and fluid balance, not elsewhere classified 09/16/2013   Other ulcerative colitis without complications (HCC) 06/11/2013   Coagulation defect, unspecified (HCC) 04/21/2013   Other allergy, initial encounter 04/21/2013   Other mechanical complication of surgically created arteriovenous fistula, initial encounter (HCC) 02/19/2013   Foot drop 12/09/2012   History of Guillain-Barre syndrome 12/09/2012   Other specified disorders of parathyroid gland (HCC) 05/15/2012   Dependence on renal dialysis (HCC) 11/29/2010   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   PCP:  Mick Sell, MD Pharmacy:   [facility] [phone] [place], [place] - [address] AT [facility] [address]  [place] [address] [place] [address] Phone: [phone] Fax: [phone]     Social Determinants of Health (SDOH) Social History: SDOH Screenings   Food Insecurity: No Food Insecurity (09/11/2022)   Received from [facility], [facility]  Transportation Needs: No Transportation Needs (09/11/2022)   Received from [facility], [facility]  Depression [phone]): Low Risk  (06/27/2021)  Financial Resource Strain: Low Risk  (09/11/2022)   Received from [facility], [facility]  Social Connections: Unknown (09/11/2022)   Received from [facility], [facility]  Stress: No Stress Concern Present (09/11/2022)   Received from [facility], [facility]  Tobacco Use: Low Risk  (12/22/2022)   SDOH Interventions:     Readmission Risk Interventions    01/22/2023   10:18 AM  Readmission Risk Prevention Plan  Transportation Screening Complete  PCP or Specialist Appt within 3-5 Days Complete  HRI or Home Care Consult Complete  Social Work Consult for Recovery Care Planning/Counseling Patient refused  Palliative Care Screening Not Applicable  Medication Review (RN Care Manager) Complete

## 2023-01-22 NOTE — Progress Notes (Signed)
Progress Note   Patient: Patrick Downs ZOX:[id number] DOB: [birth year] DOA: 01/21/2023     1 DOS: the patient was seen and examined on 01/22/2023    Subjective:  Patient seen and examined at bedside this morning Denies nausea vomiting abdominal pain or chest pain His right fourth have been dressed I have discussed the case with podiatrist who has been consulted for further management   Brief hospital course: From HPI "Patrick Downs is a 47 y.o. male with medical history significant of ESRD on hemodialysis Tuesday Thursday Saturday, GERD, hypertension, gout, type 2 diabetes, peripheral neuropathy, prior toe amputation presenting with right lower extremity cellulitis.  Patient reports ulcer formation on the right lateral foot.  Has had worsening drainage over multiple days.  Mom malaise.  Patient does wear a brace on the right foot.  Has been unable to wear brace secondary to swelling.  Noted malodorous drainage from wound.  No fevers or chills.  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  Non-smoker.  Patient denies any alcohol use.  Has been compliant with hemodialysis regimen. Presented to the ER Tmax 99.4, blood pressures 90s to 110s over 50s to 70s.  Satting well on room air.  White count 8.1, hemoglobin 10.2, platelets 150.  Creatinine 7.7, T. bili 1.5.  Right foot plain films with superficial soft tissue wound with subcutaneous gas adjacent to the base of the fifth metatarsal bone.  "  Assessment and Plan:  Cellulitis of the right fourth toe with underlying osteomyelitis Right lateral foot cellulitis in setting of end-stage renal disease with prior amputations in the past MRI of the right foot showing soft tissue ulceration lateral to the base of the fifth metatarsal with underlying marrow changes at the base of the fifth metatarsal suspicious for osteomyelitis.  No abscess detected Podiatry is consulted as well as vascular surgery and case discussed Wound care on board we  appreciate input     Type 2 diabetes mellitus with peripheral neuropathy (HCC) Continue insulin management Continue monitoring glucose closely   ESRD on dialysis (HCC) Baseline ESRD on HD TTS  No reported missed episodes of HD  Nephrologist on board we appreciate input     Polyneuropathy, unspecified Continue Lyrica   GERD (gastroesophageal reflux disease) PPI           Advance Care Planning:   Code Status: Full Code    Consults: nephrology    Family Communication: Family at the bedside-sleeping       Physical Exam:    Appearance: He is normal weight.  HENT:     Head: Normocephalic and atraumatic.     Nose: Nose normal.     Mouth/Throat:     Mouth: Mucous membranes are moist.  Cardiovascular:     Rate and Rhythm: Normal rate and regular rhythm.  Pulmonary:     Effort: Pulmonary effort is normal.  Abdominal:     General: Bowel sounds are normal.  Musculoskeletal:     Comments: Noted contractures of bilateral upper extremities  R foot wound on lateral mid foot       Vitals:   01/21/23 2052 01/22/23 0514 01/22/23 0750 01/22/23 0939  BP: 108/69 105/66 (!) 89/64 93/61  Pulse: 80 71 73 71  Resp: 18 18 18 18   Temp: 98.5 F (36.9 C) 97.7 F (36.5 C) 98 F (36.7 C) 97.6 F (36.4 C)  TempSrc:   Oral Oral  SpO2: 98% 99% 96% 99%  Weight:  Height:        Data Reviewed: Reviewed patient's MRI of the fourth as shown above as well as x-ray report, I have also reviewed patient's labs and discussed the plan of care with podiatry  Author: Loyce Dys, MD 01/22/2023 3:25 PM  For on call review [email or web address].

## 2023-01-22 NOTE — Consult Note (Addendum)
WOC Nurse Consult Note: patient has history of amputations performed by Dr. Ether Griffins, states he had an appointment with Dr Ether Griffins for this wound on R lateral foot 01/23/2023 Reason for Consult:R lateral foot wound  Wound type: full thickness ? Traumatic d/t rubbing of brace or diabetic foot ulcer  Pressure Injury POA: NA  Measurement:total area 6 cm x 4 cm soft necrotic tissue with 3 cm x 2 cm area of soft black eschar  Wound bed: 100% soft black eschar  Drainage (amount, consistency, odor) no drainage noted but foul odor noted to wound  Periwound: edema, healed prior amputations to toes   Dressing procedure/placement/frequency: Cleanse R lateral foot wound with Betadine, allow Betadine to dry then apply Xeroform gauze [place] [phone]) to wound bed daily.  Cover with dry gauze and secure with Kerlix roll gauze.   Patients MRI showed suspicion for osteomyelitis R 5th metatarsal head.  This patient requires consultation with orthopedics or podiatry.  Any wound care orders placed by ortho/podiatry will supercede WOC wound care orders.    POC discussed with bedside nurse and primary MD. WOC team will not follow. Re-consult if further needs arise.   Thank you,    Priscella Mann MSN, RN-BC, [employer] [phone]

## 2023-01-22 NOTE — Plan of Care (Signed)
  Problem: Coping: Goal: Ability to adjust to condition or change in health will improve Outcome: Progressing   Problem: Clinical Measurements: Goal: Ability to maintain clinical measurements within normal limits will improve Outcome: Progressing   Problem: Activity: Goal: Risk for activity intolerance will decrease Outcome: Progressing   Problem: Coping: Goal: Level of anxiety will decrease Outcome: Progressing

## 2023-01-22 NOTE — Consult Note (Signed)
Hospital Consult    Reason for Consult:  Right Foot Ulcer Requesting Physician:  Dr Doree Albee MD MRN #:  [id number]  History of Present Illness: This is a 47 y.o. male with medical history significant of ESRD on hemodialysis Tuesday Thursday Saturday, GERD, hypertension, gout, type 2 diabetes, peripheral neuropathy, prior toe amputation presenting with right lower extremity cellulitis.  Patient reports ulcer formation on the right lateral foot.  Has had worsening drainage over multiple days.  Patient seen by podiatry.  Patient noted to have wet gangrene on the right fifth metatarsal base.  He will need a digit amputated with antibiotic beads and wound VAC most likely in order to get it to heal.  Vascular surgery was consulted to evaluate blood flow to the right lower extremity.  Past Medical History:  Diagnosis Date   CHF (congestive heart failure) (HCC)    Degenerative disc disease, lumbar    Depression    Dialysis patient (HCC)    ESRD (end stage renal disease) on dialysis (HCC)    Failure to thrive in adult    GERD (gastroesophageal reflux disease)    Gout    Guillain Barr syndrome (HCC)    Guillain Barr syndrome (HCC)    Heart murmur    Hip pain, chronic, left 12/24/2020   HTN (hypertension)    Hyperparathyroidism (HCC)    Kidney failure    MRSA (methicillin resistant staph aureus) culture positive    Peripheral neuropathy    Pneumonia    Renal insufficiency    Respiratory failure [facility])     Past Surgical History:  Procedure Laterality Date   A/V FISTULAGRAM Right 01/07/2018   Procedure: A/V FISTULAGRAM;  Surgeon: Annice Needy, MD;  Location: [facility];  Service: Cardiovascular;  Laterality: Right;   AMPUTATION TOE Right 06/08/2017   Procedure: AMPUTATION TOE RIGHT FIFTH TOE;  Surgeon: Gwyneth Revels, DPM;  Location: [facility];  Service: Podiatry;  Laterality: Right;   AMPUTATION TOE Left 05/17/2018   Procedure: RAY LEFT 5TH;  Surgeon: Gwyneth Revels, DPM;   Location: [facility];  Service: Podiatry;  Laterality: Left;   AMPUTATION TOE Right 03/05/2020   Procedure: AMPUTATION TOE IPJ X 2 RIGHT GREAT AND 3RD;  Surgeon: Gwyneth Revels, DPM;  Location: [facility];  Service: Podiatry;  Laterality: Right;   AV FISTULA PLACEMENT     x5      2 graphs   AV FISTULA PLACEMENT Right 04/08/2020   Procedure: INSERTION OF ARTERIOVENOUS (AV) GORE-TEX GRAFT ARM ( ARTEGRAFT);  Surgeon: Annice Needy, MD;  Location: [facility];  Service: Vascular;  Laterality: Right;   DIALYSIS/PERMA CATHETER INSERTION N/A 01/20/2019   Procedure: DIALYSIS/PERMA CATHETER INSERTION;  Surgeon: Annice Needy, MD;  Location: [facility];  Service: Cardiovascular;  Laterality: N/A;   DIALYSIS/PERMA CATHETER REMOVAL N/A 06/17/2018   Procedure: DIALYSIS/PERMA CATHETER REMOVAL;  Surgeon: Annice Needy, MD;  Location: [facility];  Service: Cardiovascular;  Laterality: N/A;   PARATHYROIDECTOMY     PERIPHERAL VASCULAR THROMBECTOMY Right 01/22/2019   Procedure: PERIPHERAL VASCULAR THROMBECTOMY;  Surgeon: Annice Needy, MD;  Location: [facility];  Service: Cardiovascular;  Laterality: Right;   REMOVAL OF GRAFT Right 05/16/2020   Procedure: REMOVAL OF GRAFT;  Surgeon: Fransisco Hertz, MD;  Location: [facility];  Service: Vascular;  Laterality: Right;   RENAL BIOPSY     REVISON OF ARTERIOVENOUS FISTULA Right 02/07/2018   Procedure: REVISON OF ARTERIOVENOUS FISTULA;  Surgeon: [name]  S, MD;  Location: [facility];  Service: Vascular;  Laterality: Right;   TEE WITHOUT CARDIOVERSION N/A 01/22/2018   Procedure: TRANSESOPHAGEAL ECHOCARDIOGRAM (TEE);  Surgeon: Laurier Nancy, MD;  Location: [facility];  Service: Cardiovascular;  Laterality: N/A;   tonsiilectomy     TONSILLECTOMY     tracheotomy     UPPER EXTREMITY VENOGRAPHY Bilateral 02/23/2020   Procedure: UPPER EXTREMITY VENOGRAPHY;  Surgeon: Annice Needy, MD;  Location: [facility];  Service: Cardiovascular;  Laterality: Bilateral;     Allergies  Allergen Reactions   Hepatitis B Vaccine     Other reaction(s): Unknown   Ondansetron Other (See Comments)    Stomach pain    Minoxidil Other (See Comments)    "put fluid around my heart", PERICARDIAL EFFUSION   Morphine And Codeine Other (See Comments)    Aggressive    Omnipaque [Iohexol] Itching and Other (See Comments)    Rigors on one occasion, widespread itching on a separate occasion (resolved with Benadryl), tremors    Prior to Admission medications   Medication Sig Start Date End Date Taking? Authorizing Provider  acetaminophen (TYLENOL) 500 MG tablet Take 1,000-1,500 mg by mouth 2 (two) times daily as needed for moderate pain or headache.   Yes [provider]  Alpha-Lipoic Acid 600 MG TABS Take 1 tablet by mouth daily. 02/07/22 02/07/23 Yes [provider]  hydrOXYzine (ATARAX) 25 MG tablet Take 1 tablet by mouth 2 (two) times daily. 12/02/20  Yes [provider]  oxyCODONE-acetaminophen (PERCOCET) 10-325 MG tablet Take 1 tablet by mouth every 4 (four) hours as needed for pain. 12/23/22 01/22/23 Yes Yevette Edwards, MD  pregabalin (LYRICA) 25 MG capsule Take by mouth.   Yes [provider]  sildenafil (VIAGRA) 100 MG tablet Take 100 mg by mouth daily as needed for erectile dysfunction. 08/20/21  Yes [provider]  AURYXIA 1 GM 210 MG(Fe) tablet Take 420 mg by mouth 3 (three) times daily. Patient not taking: Reported on 01/21/2023 11/28/19   [provider]  calcium acetate (PHOSLO) 667 MG capsule Take 2,001 mg by mouth 3 (three) times daily. 08/20/21   [provider]  oxyCODONE-acetaminophen (PERCOCET) 10-325 MG tablet Take 1 tablet by mouth every 4 (four) hours as needed for pain. 01/22/23 02/21/23  Yevette Edwards, MD    Social History   Socioeconomic History   Marital status: Single    Spouse name: Not on file   Number of children: Not on file   Years of education: Not on file   Highest education  level: Not on file  Occupational History   Not on file  Tobacco Use   Smoking status: Never   Smokeless tobacco: Never  Vaping Use   Vaping status: Never Used  Substance and Sexual Activity   Alcohol use: No   Drug use: Yes    Types: Marijuana   Sexual activity: Yes  Other Topics Concern   Not on file  Social History Narrative   Not on file   Social Determinants of Health   Financial Resource Strain: Low Risk  (09/11/2022)   Received from [facility], [facility]   Overall Financial Resource Strain (CARDIA)    Difficulty of Paying Living Expenses: Not very hard  Food Insecurity: No Food Insecurity (09/11/2022)   Received from [facility], [facility]   Hunger Vital Sign    Worried About Running Out of Food in the Last  Year: Never true    Ran Out of Food in the Last Year: Never true  Transportation Needs: No Transportation Needs (09/11/2022)   Received from [facility], [facility]   [facility] - Transportation    In the past 12 months, has lack of transportation kept you from medical appointments or from getting medications?: No    Lack of Transportation (Non-Medical): No  Physical Activity: Not on file  Stress: No Stress Concern Present (09/11/2022)   Received from [facility], [facility]   [facility] - Occupational Stress Questionnaire    Feeling of Stress : Not at all  Social Connections: Unknown (09/11/2022)   Received from [facility], [facility]   Social Connection and Isolation Panel [NHANES]    Frequency of Communication with Friends and Family: Never    Frequency of Social Gatherings with Friends and Family: Never    Attends Religious Services: Not on Marketing executive or Organizations: Not on file    Attends Banker Meetings: Not on  file    Marital Status: Not on file  Intimate Partner Violence: Not on file     Family History  Problem Relation Age of Onset   Diabetes Mellitus II Father    Kidney disease Father    Kidney failure Paternal Grandfather    Prostate cancer Neg Hx    Kidney cancer Neg Hx    Bladder Cancer Neg Hx     ROS: Otherwise negative unless mentioned in HPI  Physical Examination  Vitals:   01/22/23 0750 01/22/23 0939  BP: (!) 89/64 93/61  Pulse: 73 71  Resp: 18 18  Temp: 98 F (36.7 C) 97.6 F (36.4 C)  SpO2: 96% 99%   Body mass index is 33.68 kg/m.  General:  WDWN in NAD Gait: Not observed HENT: WNL, normocephalic Pulmonary: normal non-labored breathing, without Rales, rhonchi,  wheezing Cardiac: regular, without  with Murmur, without rubs or gallops; without carotid bruits Abdomen: Positive bowel Sounds throughout, soft, NT/ND, no masses Skin: without rashes Vascular Exam/Pulses: Unable to palpate bilateral lower extremity pulses.  Extremities: with ischemic changes, with Gangrene , with cellulitis; with open wounds;  Musculoskeletal: no muscle wasting or atrophy  Neurologic: A&O X 3;  No focal weakness or paresthesias are detected; speech is fluent/normal Psychiatric:  The pt has Normal affect. Lymph:  Unremarkable  CBC    Component Value Date/Time   WBC 5.5 01/22/2023 0501   RBC 3.39 (L) 01/22/2023 0501   HGB 10.0 (L) 01/22/2023 0501   HGB 7.7 (L) 08/22/2014 0653   HCT 30.7 (L) 01/22/2023 0501   HCT 23.9 (L) 08/22/2014 0653   PLT 137 (L) 01/22/2023 0501   PLT 195 08/22/2014 0653   MCV 90.6 01/22/2023 0501   MCV 92 08/22/2014 0653   MCH 29.5 01/22/2023 0501   MCHC 32.6 01/22/2023 0501   RDW 14.6 01/22/2023 0501   RDW 17.3 (H) 08/22/2014 0653   LYMPHSABS 1.0 01/21/2023 0451   LYMPHSABS 0.9 (L) 08/22/2014 0653   MONOABS 0.9 01/21/2023 0451   MONOABS 1.6 (H) 08/22/2014 0653   EOSABS 0.0 01/21/2023 0451   EOSABS 0.0 08/22/2014 0653   BASOSABS 0.0 01/21/2023  0451   BASOSABS 0.0 08/22/2014 0653    BMET    Component Value Date/Time   NA 135 01/22/2023 0501   NA 136 08/22/2014 0653   K 4.7 01/22/2023 0501  K 5.1 08/22/2014 0653   CL 97 (L) 01/22/2023 0501   CL 95 (L) 08/22/2014 0653   CO2 23 01/22/2023 0501   CO2 27 08/22/2014 0653   GLUCOSE 111 (H) 01/22/2023 0501   GLUCOSE 176 (H) 08/22/2014 0653   BUN 56 (H) 01/22/2023 0501   BUN 71 (H) 08/22/2014 0653   CREATININE 13.12 (H) 01/22/2023 0501   CREATININE 8.87 (H) 08/22/2014 0653   CALCIUM 6.7 (L) 01/22/2023 0501   CALCIUM 7.1 (L) 08/22/2014 0653   GFRNONAA 4 (L) 01/22/2023 0501   GFRNONAA 7 (L) 08/22/2014 0653   GFRAA NOT CALCULATED 07/31/2022 1530   GFRAA 8 (L) 08/22/2014 0653    COAGS: Lab Results  Component Value Date   INR 1.2 01/21/2023   INR 1.1 08/01/2022   INR 1.2 05/14/2020     Non-Invasive Vascular Imaging:   EXAM:01/21/23 MRI OF THE RIGHT FOREFOOT WITHOUT CONTRAST   TECHNIQUE: Multiplanar, multisequence MR imaging of the right forefoot was performed. No intravenous contrast was administered.   COMPARISON:  Radiographs 01/21/2023.  No other comparison studies.   FINDINGS: Bones/Joint/Cartilage   As seen on the earlier radiographs, there is soft tissue ulceration lateral to the base of the 5th metatarsal. There is underlying marrow T2 hyperintensity and T1 hypointensity laterally in the base of the 5th metatarsal, suspicious for osteomyelitis. This area is included on the coronal and sagittal images, but not fully covered on the transverse axial images. No other evidence of osteomyelitis within the forefoot. There are postsurgical changes from previous amputations through the head of the 1st proximal phalanx, at the 3rd proximal interphalangeal joint and at the 5th metatarsophalangeal joint. No significant joint effusions.   Ligaments   Intact Lisfranc ligament. The collateral ligaments of the remaining metatarsophalangeal joints appear intact.    Muscles and Tendons   Generalized forefoot muscular atrophy without focal fluid collection or significant tenosynovitis.   Soft tissues   As above, soft tissue ulceration lateral to the base of the 5th metatarsal with surrounding skin thickening and subcutaneous edema. No focal fluid collection is demonstrated. There is mild generalized subcutaneous edema throughout the dorsal and lateral aspect of the forefoot.   IMPRESSION: 1. Soft tissue ulceration lateral to the base of the 5th metatarsal with underlying marrow changes in the base of the 5th metatarsal suspicious for osteomyelitis. 2. No evidence of soft tissue abscess. 3. Postsurgical changes as described.   EXAM:01/21/23 RIGHT FOOT - 2 VIEW   COMPARISON:  None Available.   FINDINGS: Status post first ray amputation at the head of the first proximal phalanx, third ray amputation at the PIP joint, and fifth ray amputation at the metatarsal phalangeal joint. Mild diffuse soft tissue edema. Superficial soft tissue wound with subcutaneous gas is noted adjacent to the base of the fifth metatarsal bone. There is no underlying fracture, dislocation or bony erosion.   IMPRESSION: 1. Superficial soft tissue wound with subcutaneous gas adjacent to the base of the fifth metatarsal bone. 2. No radiographic evidence for osteomyelitis. 3. Status post first, third and fifth ray amputations. Statin:  No. Beta Blocker:  No. Aspirin:  No. ACEI:  No. ARB:  No. CCB use:  No Other antiplatelets/anticoagulants:  No.    ASSESSMENT/PLAN: This is a 47 y.o. male who presents to [facility] emergency department for worsening right foot infection.  Patient was seen by podiatry for a right metatarsal lateral ulceration that started a few days ago and is progressively gotten worse.  Vascular surgery to evaluate blood  flow to right lower extremity.  PLAN: Vascular surgery plans on taking the patient to the vascular lab on Wednesday, 01/24/2023  for right lower extremity angiogram with possible intervention.  I discussed in detail this afternoon with the patient the procedure, benefits, risks, complications.  He verbalizes understanding and wishes to proceed as soon as possible.  I answered all the patient's questions this afternoon.  Patient endorses he has has upper arm fistulas in which she has had fistulogram so he understands the procedure well.  Patient will be made n.p.o. after midnight on Tuesday for procedure on Wednesday.   -I discussed the case in detail with Dr. [name] MD and he is in agreement with the plan.   Marcie Bal [facility] 01/22/2023 3:01 PM

## 2023-01-22 NOTE — Consult Note (Addendum)
PODIATRY / FOOT AND ANKLE SURGERY CONSULTATION NOTE  Requesting Physician: Dr. Meriam Sprague  Reason for consult: R 5th met base ulcer   HPI: Patrick Downs is a 47 y.o. male who presents with a nonhealing ulceration to the right fifth metatarsal base.  Patient does see Dr. Ether Griffins regularly for footcare but has not been seen in clinic now for about 3 years.  He has a history of dropfoot and does wear an AFO to the right side.  He subsequently developed an ulceration to the fifth metatarsal base and has been applying dressings himself.  He has noticed that it started to worsen over the past week with discoloration and odor from the area and did have some pain so he ended up going to the emergency room yesterday.  Patient had x-ray imaging which showed an ulcerative area with possible gas as well and has had an MRI which showed osteomyelitis to the fifth metatarsal base.  Podiatry was consulted today for further evaluation.  Patient is resting in bed fairly comfortably and does not complain of much pain or discomfort to the right foot.  He notes that he has a lot of numbness in the right foot overall.  He has had previous digital amputations to both sides.  PMHx:  Past Medical History:  Diagnosis Date   CHF (congestive heart failure) (HCC)    Degenerative disc disease, lumbar    Depression    Dialysis patient (HCC)    ESRD (end stage renal disease) on dialysis (HCC)    Failure to thrive in adult    GERD (gastroesophageal reflux disease)    Gout    Guillain Barr syndrome (HCC)    Guillain Barr syndrome (HCC)    Heart murmur    Hip pain, chronic, left 12/24/2020   HTN (hypertension)    Hyperparathyroidism (HCC)    Kidney failure    MRSA (methicillin resistant staph aureus) culture positive    Peripheral neuropathy    Pneumonia    Renal insufficiency    Respiratory failure (HCC)     Surgical Hx:  Past Surgical History:  Procedure Laterality Date   A/V FISTULAGRAM Right 01/07/2018   Procedure:  A/V FISTULAGRAM;  Surgeon: Annice Needy, MD;  Location: [facility];  Service: Cardiovascular;  Laterality: Right;   AMPUTATION TOE Right 06/08/2017   Procedure: AMPUTATION TOE RIGHT FIFTH TOE;  Surgeon: Gwyneth Revels, DPM;  Location: [facility];  Service: Podiatry;  Laterality: Right;   AMPUTATION TOE Left 05/17/2018   Procedure: RAY LEFT 5TH;  Surgeon: Gwyneth Revels, DPM;  Location: [facility];  Service: Podiatry;  Laterality: Left;   AMPUTATION TOE Right 03/05/2020   Procedure: AMPUTATION TOE IPJ X 2 RIGHT GREAT AND 3RD;  Surgeon: Gwyneth Revels, DPM;  Location: [facility];  Service: Podiatry;  Laterality: Right;   AV FISTULA PLACEMENT     x5      2 graphs   AV FISTULA PLACEMENT Right 04/08/2020   Procedure: INSERTION OF ARTERIOVENOUS (AV) GORE-TEX GRAFT ARM ( ARTEGRAFT);  Surgeon: Annice Needy, MD;  Location: [facility];  Service: Vascular;  Laterality: Right;   DIALYSIS/PERMA CATHETER INSERTION N/A 01/20/2019   Procedure: DIALYSIS/PERMA CATHETER INSERTION;  Surgeon: Annice Needy, MD;  Location: [facility];  Service: Cardiovascular;  Laterality: N/A;   DIALYSIS/PERMA CATHETER REMOVAL N/A 06/17/2018   Procedure: DIALYSIS/PERMA CATHETER REMOVAL;  Surgeon: Annice Needy, MD;  Location: [facility];  Service: Cardiovascular;  Laterality: N/A;   PARATHYROIDECTOMY  PERIPHERAL VASCULAR THROMBECTOMY Right 01/22/2019   Procedure: PERIPHERAL VASCULAR THROMBECTOMY;  Surgeon: Annice Needy, MD;  Location: [facility];  Service: Cardiovascular;  Laterality: Right;   REMOVAL OF GRAFT Right 05/16/2020   Procedure: REMOVAL OF GRAFT;  Surgeon: Fransisco Hertz, MD;  Location: [facility];  Service: Vascular;  Laterality: Right;   RENAL BIOPSY     REVISON OF ARTERIOVENOUS FISTULA Right 02/07/2018   Procedure: REVISON OF ARTERIOVENOUS FISTULA;  Surgeon: Annice Needy, MD;  Location: [facility];  Service: Vascular;  Laterality: Right;   TEE WITHOUT CARDIOVERSION N/A 01/22/2018   Procedure:  TRANSESOPHAGEAL ECHOCARDIOGRAM (TEE);  Surgeon: Laurier Nancy, MD;  Location: [facility];  Service: Cardiovascular;  Laterality: N/A;   tonsiilectomy     TONSILLECTOMY     tracheotomy     UPPER EXTREMITY VENOGRAPHY Bilateral 02/23/2020   Procedure: UPPER EXTREMITY VENOGRAPHY;  Surgeon: Annice Needy, MD;  Location: [facility];  Service: Cardiovascular;  Laterality: Bilateral;    FHx:  Family History  Problem Relation Age of Onset   Diabetes Mellitus II Father    Kidney disease Father    Kidney failure Paternal Grandfather    Prostate cancer Neg Hx    Kidney cancer Neg Hx    Bladder Cancer Neg Hx     Social History:  reports that he has never smoked. He has never used smokeless tobacco. He reports current drug use. Drug: Marijuana. He reports that he does not drink alcohol.  Allergies:  Allergies  Allergen Reactions   Hepatitis B Vaccine     Other reaction(s): Unknown   Ondansetron Other (See Comments)    Stomach pain    Minoxidil Other (See Comments)    "put fluid around my heart", PERICARDIAL EFFUSION   Morphine And Codeine Other (See Comments)    Aggressive    Omnipaque [Iohexol] Itching and Other (See Comments)    Rigors on one occasion, widespread itching on a separate occasion (resolved with Benadryl), tremors    Medications Prior to Admission  Medication Sig Dispense Refill   acetaminophen (TYLENOL) 500 MG tablet Take 1,000-1,500 mg by mouth 2 (two) times daily as needed for moderate pain or headache.     Alpha-Lipoic Acid 600 MG TABS Take 1 tablet by mouth daily.     hydrOXYzine (ATARAX) 25 MG tablet Take 1 tablet by mouth 2 (two) times daily.     oxyCODONE-acetaminophen (PERCOCET) 10-325 MG tablet Take 1 tablet by mouth every 4 (four) hours as needed for pain. 135 tablet 0   pregabalin (LYRICA) 25 MG capsule Take by mouth.     sildenafil (VIAGRA) 100 MG tablet Take 100 mg by mouth daily as needed for erectile dysfunction.     AURYXIA 1 GM 210 MG(Fe) tablet  Take 420 mg by mouth 3 (three) times daily. (Patient not taking: Reported on 01/21/2023)     calcium acetate (PHOSLO) 667 MG capsule Take 2,001 mg by mouth 3 (three) times daily.     oxyCODONE-acetaminophen (PERCOCET) 10-325 MG tablet Take 1 tablet by mouth every 4 (four) hours as needed for pain. 135 tablet 0    Physical Exam: General: Alert and oriented.  No apparent distress.  Vascular: DP/PT pulses difficult to palpate, capillary fill time appears to be intact to digits bilaterally, both feet warm to touch.  No hair growth noted to bilateral lower extremities.  Mild to moderate nonpitting edema present to bilateral lower extremities.  Neuro: Light touch sensation reduced to nearly  absent to bilateral lower extremities.  Derm: Ulceration present to the right fifth metatarsal base, appears to have a wet gangrene type of appearance, appears to be fairly substantial and probes to the fifth metatarsal base, measures approximately 3 cm x 2 cm, do not see any viable tissue in this area down to the level of bone.  No erythema, moderate odor noted, no purulent drainage, appears to have some serous drainage present.    MSK: Selective digital amputations to both feet.  Mild pain on palpation of the right fifth metatarsal base.  Results for orders placed or performed during the hospital encounter of 01/21/23 (from the past 48 hour(s))  Lactic acid, plasma     Status: None   Collection Time: 01/21/23  4:51 AM  Result Value Ref Range   Lactic Acid, Venous 1.5 0.5 - 1.9 mmol/L    Comment: Performed at [facility], [address] [place], [place] [address]  Comprehensive metabolic panel     Status: Abnormal   Collection Time: 01/21/23  4:51 AM  Result Value Ref Range   Sodium 140 135 - 145 mmol/L   Potassium 3.8 3.5 - 5.1 mmol/L    Comment: HEMOLYSIS AT THIS LEVEL MAY AFFECT RESULT   Chloride 109 98 - 111 mmol/L   CO2 21 (L) 22 - 32 mmol/L   Glucose, Bld 91 70 - 99 mg/dL    Comment:  Glucose reference range applies only to samples taken after fasting for at least 8 hours.   BUN 31 (H) 6 - 20 mg/dL   Creatinine, Ser 6.21 (H) 0.61 - 1.24 mg/dL   Calcium 5.2 (LL) 8.9 - 10.3 mg/dL    Comment: CRITICAL RESULT CALLED TO, READ BACK BY AND VERIFIED WITH HEATHER LEE RN ED @ [phone] 01/21/23 LFD/PMF    Total Protein 5.6 (L) 6.5 - 8.1 g/dL   Albumin 2.7 (L) 3.5 - 5.0 g/dL   AST 26 15 - 41 U/L    Comment: HEMOLYSIS AT THIS LEVEL MAY AFFECT RESULT   ALT 10 0 - 44 U/L    Comment: HEMOLYSIS AT THIS LEVEL MAY AFFECT RESULT   Alkaline Phosphatase 34 (L) 38 - 126 U/L   Total Bilirubin 1.5 (H) 0.3 - 1.2 mg/dL    Comment: HEMOLYSIS AT THIS LEVEL MAY AFFECT RESULT   GFR, Estimated 8 (L) >60 mL/min    Comment: (NOTE) Calculated using the CKD-EPI Creatinine Equation (2021)    Anion gap 10 5 - 15    Comment: Performed at [facility], [address] [place], [place] [address]  CBC with Differential     Status: Abnormal   Collection Time: 01/21/23  4:51 AM  Result Value Ref Range   WBC 8.1 4.0 - 10.5 K/uL   RBC 3.47 (L) 4.22 - 5.81 MIL/uL   Hemoglobin 10.2 (L) 13.0 - 17.0 g/dL   HCT 96.2 (L) 95.2 - 84.1 %   MCV 93.4 80.0 - 100.0 fL   MCH 29.4 26.0 - 34.0 pg   MCHC 31.5 30.0 - 36.0 g/dL   RDW 32.4 40.1 - 02.7 %   Platelets 150 150 - 400 K/uL   nRBC 0.0 0.0 - 0.2 %   Neutrophils Relative % 76 %   Neutro Abs 6.2 1.7 - 7.7 K/uL   Lymphocytes Relative 13 %   Lymphs Abs 1.0 0.7 - 4.0 K/uL   Monocytes Relative 11 %   Monocytes Absolute 0.9 0.1 - 1.0 K/uL   Eosinophils Relative 0 %  Eosinophils Absolute 0.0 0.0 - 0.5 K/uL   Basophils Relative 0 %   Basophils Absolute 0.0 0.0 - 0.1 K/uL   Immature Granulocytes 0 %   Abs Immature Granulocytes 0.02 0.00 - 0.07 K/uL    Comment: Performed at [facility], [address] [place], [place] [address]  Protime-INR     Status: Abnormal   Collection Time: 01/21/23  4:51 AM  Result Value Ref Range   Prothrombin Time 15.5  (H) 11.4 - 15.2 seconds   INR 1.2 0.8 - 1.2    Comment: (NOTE) INR goal varies based on device and disease states. Performed at [facility], [address] [place], [place] [address]   APTT     Status: Abnormal   Collection Time: 01/21/23  4:51 AM  Result Value Ref Range   aPTT 47 (H) 24 - 36 seconds    Comment:        IF BASELINE aPTT IS ELEVATED, SUGGEST PATIENT RISK ASSESSMENT BE USED TO DETERMINE APPROPRIATE ANTICOAGULANT THERAPY. Performed at [facility], [address] [place], [place] [address]   Procalcitonin     Status: None   Collection Time: 01/21/23  4:51 AM  Result Value Ref Range   Procalcitonin 0.73 ng/mL    Comment:        Interpretation: PCT > 0.5 ng/mL and <= 2 ng/mL: Systemic infection (sepsis) is possible, but other conditions are known to elevate PCT as well. (NOTE)       Sepsis PCT Algorithm           Lower Respiratory Tract                                      Infection PCT Algorithm    ----------------------------     ----------------------------         PCT < 0.25 ng/mL                PCT < 0.10 ng/mL          Strongly encourage             Strongly discourage   discontinuation of antibiotics    initiation of antibiotics    ----------------------------     -----------------------------       PCT 0.25 - 0.50 ng/mL            PCT 0.10 - 0.25 ng/mL               OR       >80% decrease in PCT            Discourage initiation of                                            antibiotics      Encourage discontinuation           of antibiotics    ----------------------------     -----------------------------         PCT >= 0.50 ng/mL              PCT 0.26 - 0.50 ng/mL                AND       <80% decrease in PCT  Encourage initiation of                                             antibiotics       Encourage continuation           of antibiotics    ----------------------------     -----------------------------         PCT >= 0.50 ng/mL                  PCT > 0.50 ng/mL               AND         increase in PCT                  Strongly encourage                                      initiation of antibiotics    Strongly encourage escalation           of antibiotics                                     -----------------------------                                           PCT <= 0.25 ng/mL                                                 OR                                        > 80% decrease in PCT                                      Discontinue / Do not initiate                                             antibiotics  Performed at [facility], [address] [place], [place] [address]   Magnesium     Status: Abnormal   Collection Time: 01/21/23  4:51 AM  Result Value Ref Range   Magnesium 1.6 (L) 1.7 - 2.4 mg/dL    Comment: Performed at [facility], [address] [place], [place] [address]  Phosphorus     Status: None   Collection Time: 01/21/23  4:51 AM  Result Value Ref Range   Phosphorus 2.6 2.5 - 4.6 mg/dL    Comment: HEMOLYSIS AT THIS LEVEL MAY AFFECT RESULT Performed at [facility], [address], [place], [place] [address]   Blood Culture (routine x 2)     Status:  None (Preliminary result)   Collection Time: 01/21/23  5:08 AM   Specimen: BLOOD  Result Value Ref Range   Specimen Description BLOOD BLOOD RIGHT ARM    Special Requests      BOTTLES DRAWN AEROBIC AND ANAEROBIC Blood Culture results may not be optimal due to an inadequate volume of blood received in culture bottles   Culture      NO GROWTH 1 DAY Performed at [facility], [address] [place], [place] [address]    Report Status PENDING   Blood Culture (routine x 2)     Status: None (Preliminary result)   Collection Time: 01/21/23  5:31 AM   Specimen: BLOOD  Result Value Ref Range   Specimen Description BLOOD BLOOD LEFT ARM    Special Requests      BOTTLES DRAWN  AEROBIC AND ANAEROBIC Blood Culture results may not be optimal due to an inadequate volume of blood received in culture bottles   Culture      NO GROWTH 1 DAY Performed at [facility], [address] [place], [place] [address]    Report Status PENDING   CBG monitoring, ED     Status: Abnormal   Collection Time: 01/21/23 11:26 AM  Result Value Ref Range   Glucose-Capillary 120 (H) 70 - 99 mg/dL    Comment: Glucose reference range applies only to samples taken after fasting for at least 8 hours.  HIV Antibody (routine testing w rflx)     Status: None   Collection Time: 01/21/23  2:35 PM  Result Value Ref Range   HIV Screen 4th Generation wRfx Non Reactive Non Reactive    Comment: Performed at [facility], [address] [place], [place] [address]  Hemoglobin A1c     Status: Abnormal   Collection Time: 01/21/23  2:35 PM  Result Value Ref Range   Hgb A1c MFr Bld 6.0 (H) 4.8 - 5.6 %    Comment: (NOTE) Pre diabetes:          5.7%-6.4%  Diabetes:              >6.4%  Glycemic control for   <7.0% adults with diabetes    Mean Plasma Glucose 125.5 mg/dL    Comment: Performed at [facility], [address], [place], [place] [address]  Sedimentation rate     Status: Abnormal   Collection Time: 01/21/23  2:35 PM  Result Value Ref Range   Sed Rate 68 (H) 0 - 15 mm/hr    Comment: Performed at [facility], [address] [place], [place] [address]  C-reactive protein     Status: Abnormal   Collection Time: 01/21/23  2:35 PM  Result Value Ref Range   CRP 10.6 (H) <1.0 mg/dL    Comment: Performed at [facility], [address], [place], [place] [address]  Prealbumin     Status: None   Collection Time: 01/21/23  2:35 PM  Result Value Ref Range   Prealbumin 24 18 - 38 mg/dL    Comment: Performed at [facility], [address] [place], [place] [address]  Glucose, capillary     Status: None   Collection Time: 01/21/23  4:47 PM  Result  Value Ref Range   Glucose-Capillary 96 70 - 99 mg/dL    Comment: Glucose reference range applies only to samples taken after fasting for at least 8 hours.  Glucose, capillary     Status:  Abnormal   Collection Time: 01/21/23  8:53 PM  Result Value Ref Range   Glucose-Capillary 122 (H) 70 - 99 mg/dL    Comment: Glucose reference range applies only to samples taken after fasting for at least 8 hours.  CBC     Status: Abnormal   Collection Time: 01/22/23  5:01 AM  Result Value Ref Range   WBC 5.5 4.0 - 10.5 K/uL   RBC 3.39 (L) 4.22 - 5.81 MIL/uL   Hemoglobin 10.0 (L) 13.0 - 17.0 g/dL   HCT 96.2 (L) 95.2 - 84.1 %   MCV 90.6 80.0 - 100.0 fL   MCH 29.5 26.0 - 34.0 pg   MCHC 32.6 30.0 - 36.0 g/dL   RDW 32.4 40.1 - 02.7 %   Platelets 137 (L) 150 - 400 K/uL   nRBC 0.0 0.0 - 0.2 %    Comment: Performed at [facility], [address] [place], [place] [address]  Comprehensive metabolic panel     Status: Abnormal   Collection Time: 01/22/23  5:01 AM  Result Value Ref Range   Sodium 135 135 - 145 mmol/L   Potassium 4.7 3.5 - 5.1 mmol/L   Chloride 97 (L) 98 - 111 mmol/L   CO2 23 22 - 32 mmol/L   Glucose, Bld 111 (H) 70 - 99 mg/dL    Comment: Glucose reference range applies only to samples taken after fasting for at least 8 hours.   BUN 56 (H) 6 - 20 mg/dL   Creatinine, Ser 44.03 (H) 0.61 - 1.24 mg/dL   Calcium 6.7 (L) 8.9 - 10.3 mg/dL   Total Protein 6.7 6.5 - 8.1 g/dL   Albumin 3.1 (L) 3.5 - 5.0 g/dL   AST 12 (L) 15 - 41 U/L   ALT 14 0 - 44 U/L   Alkaline Phosphatase 47 38 - 126 U/L   Total Bilirubin 0.9 0.3 - 1.2 mg/dL   GFR, Estimated 4 (L) >60 mL/min    Comment: (NOTE) Calculated using the CKD-EPI Creatinine Equation (2021)    Anion gap 15 5 - 15    Comment: Performed at [facility], [address] [place], [place] [address]  Glucose, capillary     Status: None   Collection Time: 01/22/23  8:21 AM  Result Value Ref Range   Glucose-Capillary 99 70 - 99  mg/dL    Comment: Glucose reference range applies only to samples taken after fasting for at least 8 hours.  Glucose, capillary     Status: Abnormal   Collection Time: 01/22/23 11:39 AM  Result Value Ref Range   Glucose-Capillary 106 (H) 70 - 99 mg/dL    Comment: Glucose reference range applies only to samples taken after fasting for at least 8 hours.   MR FOOT RIGHT WO CONTRAST  Result Date: 01/21/2023 CLINICAL DATA:  Lateral foot wound. Soft tissue infection suspected. History of end-stage renal disease with foot pain, erythema and low level fever for 1 month. EXAM: MRI OF THE RIGHT FOREFOOT WITHOUT CONTRAST TECHNIQUE: Multiplanar, multisequence MR imaging of the right forefoot was performed. No intravenous contrast was administered. COMPARISON:  Radiographs 01/21/2023.  No other comparison studies. FINDINGS: Bones/Joint/Cartilage As seen on the earlier radiographs, there is soft tissue ulceration lateral to the base of the 5th metatarsal. There is underlying marrow T2 hyperintensity and T1 hypointensity laterally in the base of the 5th metatarsal, suspicious for osteomyelitis. This area is included on the coronal and sagittal images, but not fully covered on the  transverse axial images. No other evidence of osteomyelitis within the forefoot. There are postsurgical changes from previous amputations through the head of the 1st proximal phalanx, at the 3rd proximal interphalangeal joint and at the 5th metatarsophalangeal joint. No significant joint effusions. Ligaments Intact Lisfranc ligament. The collateral ligaments of the remaining metatarsophalangeal joints appear intact. Muscles and Tendons Generalized forefoot muscular atrophy without focal fluid collection or significant tenosynovitis. Soft tissues As above, soft tissue ulceration lateral to the base of the 5th metatarsal with surrounding skin thickening and subcutaneous edema. No focal fluid collection is demonstrated. There is mild generalized  subcutaneous edema throughout the dorsal and lateral aspect of the forefoot. IMPRESSION: 1. Soft tissue ulceration lateral to the base of the 5th metatarsal with underlying marrow changes in the base of the 5th metatarsal suspicious for osteomyelitis. 2. No evidence of soft tissue abscess. 3. Postsurgical changes as described. Electronically Signed   By: Carey Bullocks M.D.   On: 01/21/2023 11:28   DG Foot 2 Views Right  Result Date: 01/21/2023 CLINICAL DATA:  Evaluate for osteomyelitis. Complains of right foot wound located on outside region of foot. EXAM: RIGHT FOOT - 2 VIEW COMPARISON:  None Available. FINDINGS: Status post first ray amputation at the head of the first proximal phalanx, third ray amputation at the PIP joint, and fifth ray amputation at the metatarsal phalangeal joint. Mild diffuse soft tissue edema. Superficial soft tissue wound with subcutaneous gas is noted adjacent to the base of the fifth metatarsal bone. There is no underlying fracture, dislocation or bony erosion. IMPRESSION: 1. Superficial soft tissue wound with subcutaneous gas adjacent to the base of the fifth metatarsal bone. 2. No radiographic evidence for osteomyelitis. 3. Status post first, third and fifth ray amputations. Electronically Signed   By: Signa Kell M.D.   On: 01/21/2023 05:57    Blood pressure 93/61, pulse 71, temperature 97.6 F (36.4 C), temperature source Oral, resp. rate 18, height 6\' 2"  (1.88 m), weight 119 kg, SpO2 99%.  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 -X-ray imaging and MRI imaging reviewed and discussed with patient detail showing osteomyelitis to the right fifth metatarsal base. -Wound debridement performed as described below, patient tolerated procedure well. -Wound appears to have wet gangrene present with bone exposed, appears to have mostly  nonviable tissue at the area of the ulcerative site but the periphery appears to be healthy and bleeding. -Applied Betadine wet-to-dry dressing.  Order placed for dressing changes daily. -Wound culture ordered for right foot. -Appreciate medicine recommendations for antibiotic therapy. -Consult placed with vascular surgery, Dr. Wyn Quaker.  Will plan for angiogram at some point this week. -All treatment options were discussed with the patient of both conservative and surgical attempts at correction including potential risks and complications.  Patient has elected for procedure consisting of right fifth ray amputation with removal of all nonviable necrotic tissues, application of antibiotic beads, possible wound VAC application.  No guarantees given.  Consent obtained. -Patient to be n.p.o. at midnight for surgery tomorrow with Dr. Ether Griffins around 12 PM. -Patient is at high risk for limb loss.  Discussed that removing the fifth metatarsal in its entirety may cause an equinovarus rotation of the foot.  Patient does wear an AFO so potentially the AFO will work to hold the position to avoid further contracture.  If does have further contracture the may have to consider tendon transfers in  the future but currently the foot send no state for tendon transfer.  Debridment of ulcer: Location: Right fifth metatarsal base lateral Pre-debridement measurement: 3 x 2 x 0.5 cm Post-debridement measurement: Same Tissue removed:   Fibrous tissue, biofilm, necrotic tissue. Ulcer was debrided sharply (100% excisional) with combination of tissue nippers and scalpel blade into the muscle/tendon   Rosetta Posner, DPM 01/22/2023, 2:30 PM

## 2023-01-22 NOTE — Progress Notes (Addendum)
Initial Nutrition Assessment  DOCUMENTATION CODES:   Obesity unspecified  INTERVENTION:   -Renal MVI daily -500 mg vitamin C BID -220 mg zinc sulfate daily x 14 days -30 ml Prosource Plus TID, each supplement provides 100 kcals and 15 grams protein -1 packet Juven BID, each packet provides 95 calories, 2.5 grams of protein (collagen), and 9.8 grams of carbohydrate (3 grams sugar); also contains 7 grams of L-arginine and L-glutamine, 300 mg vitamin C, 15 mg vitamin E, 1.2 mcg vitamin B-12, 9.5 mg zinc, 200 mg calcium, and 1.5 g  Calcium Beta-hydroxy-Beta-methylbutyrate to support wound healing   NUTRITION DIAGNOSIS:   Increased nutrient needs related to wound healing as evidenced by estimated needs.  GOAL:   Patient will meet greater than or equal to 90% of their needs  MONITOR:   PO intake, Supplement acceptance  REASON FOR ASSESSMENT:   Consult Wound healing  ASSESSMENT:   Pt with medical history significant of ESRD on hemodialysis Tuesday Thursday Saturday, GERD, hypertension, gout, type 2 diabetes, peripheral neuropathy, prior toe amputation presenting with right lower extremity cellulitis.  Patient reports ulcer formation on the right lateral foot.  Pt admitted with rt lateral foot cellulitis.   Reviewed I/O's: +100 ml x 24 hours  Pt sleeping soundly at time of visit. He did not respond to voice or touch. No family present to provide additional history.   Pt is currently on a renal diet with 1.2 L fluid restriction. Noted meal completions documented at 100%. Observed meal tray in front of pt, which was untouched.   Per [facility] note, pt with full thickness wound to rt lateral foot; MRI shows concern for osteomyelitis.   Unsure of EDW. Reviewed wt hx; pt has experienced a 2% wt loss over the past 6 months, which is not significant for time frame. Pt with moderate edema, which may be masking true weight loss as well as fat and muscle depletions.   Pt with increased  nutritional needs for wound healing and would benefit from addition of oral nutrition supplements.   Lab Results  Component Value Date   HGBA1C 6.0 (H) 01/21/2023   PTA DM medications are none.   Labs reviewed: CBGS: 96-122 (inpatient orders for glycemic control are 0-6 units insulin aspart TID with meals).    NUTRITION - FOCUSED PHYSICAL EXAM:  Flowsheet Row Most Recent Value  Orbital Region No depletion  Upper Arm Region No depletion  Thoracic and Lumbar Region No depletion  Buccal Region No depletion  Temple Region No depletion  Clavicle Bone Region No depletion  Clavicle and Acromion Bone Region No depletion  Scapular Bone Region No depletion  Dorsal Hand No depletion  Patellar Region No depletion  Anterior Thigh Region No depletion  Posterior Calf Region No depletion  Edema (RD Assessment) Moderate  Hair Reviewed  Eyes Reviewed  Mouth Reviewed  Skin Reviewed  Nails Reviewed       Diet Order:   Diet Order             Diet renal with fluid restriction Fluid restriction: 1200 mL Fluid; Room service appropriate? Yes; Fluid consistency: Thin  Diet effective now                   EDUCATION NEEDS:   Education needs have been addressed  Skin:  Skin Assessment: Skin Integrity Issues: Skin Integrity Issues:: Other (Comment) Other: rt lateral foot full thickness wound with cellulitis  Last BM:  01/21/23  Height:   Ht Readings from  Last 1 Encounters:  01/21/23 6\' 2"  (1.88 m)    Weight:   Wt Readings from Last 1 Encounters:  01/21/23 119 kg    Ideal Body Weight:  86.4 kg  BMI:  Body mass index is 33.68 kg/m.  Estimated Nutritional Needs:   Kcal:  2400-2600  Protein:  115-130 grams  Fluid:  1000 ml + UOP    Levada Schilling, RD, LDN, CDCES Registered Dietitian III Certified Diabetes Care and Education Specialist Please refer to [facility] for RD and/or RD on-call/weekend/after hours pager

## 2023-01-22 NOTE — Progress Notes (Signed)
Introduced patient to role of Statistician. Patient drowsy, but able to converse. Patient lives alone, with his dog. He drives to/from his appointments, including his three times weekly dialysis appointments. His PCP is Dr. Clydie Braun.   He is not currently employed, but denies any SDOH needs at present. He does endorse a history of incarceration. Supplied with information for [employer].

## 2023-01-23 ENCOUNTER — Inpatient Hospital Stay: Payer: 59 | Admitting: Anesthesiology

## 2023-01-23 ENCOUNTER — Other Ambulatory Visit: Payer: Self-pay

## 2023-01-23 ENCOUNTER — Encounter
Admission: EM | Disposition: A | Discharge status: Home Health | Payer: Self-pay | Source: Home / Self Care | Attending: Internal Medicine

## 2023-01-23 DIAGNOSIS — L97519 Non-pressure chronic ulcer of other part of right foot with unspecified severity: Secondary | ICD-10-CM | POA: Diagnosis not present

## 2023-01-23 DIAGNOSIS — L03115 Cellulitis of right lower limb: Secondary | ICD-10-CM | POA: Diagnosis not present

## 2023-01-23 HISTORY — PX: IRRIGATION AND DEBRIDEMENT FOOT: SHX6602

## 2023-01-23 LAB — CBC WITH DIFFERENTIAL/PLATELET
Abs Immature Granulocytes: 0.02 K/uL (ref 0.00–0.07)
Basophils Absolute: 0 K/uL (ref 0.0–0.1)
Basophils Relative: 0 %
Eosinophils Absolute: 0.1 K/uL (ref 0.0–0.5)
Eosinophils Relative: 2 %
HCT: 27.9 % — ABNORMAL LOW (ref 39.0–52.0)
Hemoglobin: 9.3 g/dL — ABNORMAL LOW (ref 13.0–17.0)
Immature Granulocytes: 0 %
Lymphocytes Relative: 18 %
Lymphs Abs: 1.1 K/uL (ref 0.7–4.0)
MCH: 29.8 pg (ref 26.0–34.0)
MCHC: 33.3 g/dL (ref 30.0–36.0)
MCV: 89.4 fL (ref 80.0–100.0)
Monocytes Absolute: 0.7 K/uL (ref 0.1–1.0)
Monocytes Relative: 11 %
Neutro Abs: 4 K/uL (ref 1.7–7.7)
Neutrophils Relative %: 69 %
Platelets: 134 K/uL — ABNORMAL LOW (ref 150–400)
RBC: 3.12 MIL/uL — ABNORMAL LOW (ref 4.22–5.81)
RDW: 14.7 % (ref 11.5–15.5)
WBC: 5.9 K/uL (ref 4.0–10.5)
nRBC: 0 % (ref 0.0–0.2)

## 2023-01-23 LAB — BASIC METABOLIC PANEL WITH GFR
Anion gap: 16 — ABNORMAL HIGH (ref 5–15)
BUN: 77 mg/dL — ABNORMAL HIGH (ref 6–20)
CO2: 22 mmol/L (ref 22–32)
Calcium: 6.5 mg/dL — ABNORMAL LOW (ref 8.9–10.3)
Chloride: 97 mmol/L — ABNORMAL LOW (ref 98–111)
Creatinine, Ser: 15.15 mg/dL — ABNORMAL HIGH (ref 0.61–1.24)
GFR, Estimated: 4 mL/min — ABNORMAL LOW
Glucose, Bld: 90 mg/dL (ref 70–99)
Potassium: 4.6 mmol/L (ref 3.5–5.1)
Sodium: 135 mmol/L (ref 135–145)

## 2023-01-23 LAB — HEPATITIS B SURFACE ANTIGEN: Hepatitis B Surface Ag: NONREACTIVE

## 2023-01-23 LAB — GLUCOSE, CAPILLARY
Glucose-Capillary: 185 mg/dL — ABNORMAL HIGH (ref 70–99)
Glucose-Capillary: 105 mg/dL — ABNORMAL HIGH (ref 70–99)

## 2023-01-23 SURGERY — IRRIGATION AND DEBRIDEMENT FOOT
Anesthesia: General | Laterality: Right

## 2023-01-23 MED ORDER — PHENYLEPHRINE 80 MCG/ML (10ML) SYRINGE FOR IV PUSH (FOR BLOOD PRESSURE SUPPORT)
PREFILLED_SYRINGE | INTRAVENOUS | Status: AC
  Filled 2023-01-23: qty 10

## 2023-01-23 MED ORDER — PROPOFOL 10 MG/ML IV BOLUS
INTRAVENOUS | Status: AC
  Filled 2023-01-23: qty 20

## 2023-01-23 MED ORDER — LIDOCAINE HCL (PF) 1 % IJ SOLN
INTRAMUSCULAR | Status: AC
  Filled 2023-01-23: qty 30

## 2023-01-23 MED ORDER — BUPIVACAINE HCL (PF) 0.25 % IJ SOLN
INTRAMUSCULAR | Status: AC
  Filled 2023-01-23: qty 30

## 2023-01-23 MED ORDER — PROPOFOL 500 MG/50ML IV EMUL
INTRAVENOUS | Status: DC | PRN
Start: 2023-01-23 — End: 2023-01-23
  Administered 2023-01-23: 75 ug/kg/min via INTRAVENOUS

## 2023-01-23 MED ORDER — VANCOMYCIN HCL 1000 MG IV SOLR
INTRAVENOUS | Status: AC
  Filled 2023-01-23: qty 20

## 2023-01-23 MED ORDER — BUPIVACAINE HCL (PF) 0.5 % IJ SOLN
INTRAMUSCULAR | Status: AC
  Filled 2023-01-23: qty 30

## 2023-01-23 MED ORDER — LIDOCAINE-EPINEPHRINE 1 %-1:100000 IJ SOLN
INTRAMUSCULAR | Status: AC
  Filled 2023-01-23: qty 1

## 2023-01-23 MED ORDER — METRONIDAZOLE 500 MG/100ML IV SOLN
500.0000 mg | Freq: Two times a day (BID) | INTRAVENOUS | Status: AC
  Administered 2023-01-23 – 2023-01-28 (×10): 500 mg via INTRAVENOUS
  Filled 2023-01-23 (×10): qty 100

## 2023-01-23 MED ORDER — FENTANYL CITRATE (PF) 100 MCG/2ML IJ SOLN
INTRAMUSCULAR | Status: AC
  Filled 2023-01-23: qty 2

## 2023-01-23 MED ORDER — VANCOMYCIN HCL IN DEXTROSE 1-5 GM/200ML-% IV SOLN
1000.0000 mg | INTRAVENOUS | Status: DC
  Administered 2023-01-25: 1000 mg via INTRAVENOUS
  Filled 2023-01-23 (×3): qty 200

## 2023-01-23 MED ORDER — SODIUM CHLORIDE 0.9 % IV SOLN
INTRAVENOUS | Status: DC | PRN
Start: 2023-01-23 — End: 2023-01-23

## 2023-01-23 MED ORDER — FENTANYL CITRATE (PF) 100 MCG/2ML IJ SOLN
INTRAMUSCULAR | Status: DC | PRN
  Administered 2023-01-23 (×3): 25 ug via INTRAVENOUS

## 2023-01-23 MED ORDER — SODIUM CHLORIDE 0.9 % IR SOLN
Status: DC | PRN
Start: 2023-01-23 — End: 2023-01-23
  Administered 2023-01-23: 1000 mL

## 2023-01-23 MED ORDER — PROPOFOL 10 MG/ML IV BOLUS
INTRAVENOUS | Status: DC | PRN
  Administered 2023-01-23 (×2): 40 mg via INTRAVENOUS

## 2023-01-23 MED ORDER — PHENYLEPHRINE 80 MCG/ML (10ML) SYRINGE FOR IV PUSH (FOR BLOOD PRESSURE SUPPORT)
PREFILLED_SYRINGE | INTRAVENOUS | Status: DC | PRN
  Administered 2023-01-23 (×2): 80 ug via INTRAVENOUS

## 2023-01-23 MED ORDER — LIDOCAINE-EPINEPHRINE 1 %-1:100000 IJ SOLN
INTRAMUSCULAR | Status: DC | PRN
  Administered 2023-01-23: 20 mL

## 2023-01-23 SURGICAL SUPPLY — 74 items
BLADE OSC/SAGITTAL MD 5.5X18 (BLADE) IMPLANT
BLADE OSCILLATING/SAGITTAL (BLADE)
BLADE SURG 15 STRL LF DISP TIS (BLADE) ×1 IMPLANT
BLADE SURG 15 STRL SS (BLADE) ×1
BLADE SW THK.38XMED LNG THN (BLADE) IMPLANT
BNDG CMPR 5X4 CHSV STRCH STRL (GAUZE/BANDAGES/DRESSINGS) ×1
BNDG CMPR 5X4 KNIT ELC UNQ LF (GAUZE/BANDAGES/DRESSINGS)
BNDG CMPR 5X6 CHSV STRCH STRL (GAUZE/BANDAGES/DRESSINGS)
BNDG CMPR 75X21 PLY HI ABS (MISCELLANEOUS)
BNDG COHESIVE 4X5 TAN STRL LF (GAUZE/BANDAGES/DRESSINGS) ×1 IMPLANT
BNDG COHESIVE 6X5 TAN ST LF (GAUZE/BANDAGES/DRESSINGS) ×1 IMPLANT
BNDG ELASTIC 4INX 5YD STR LF (GAUZE/BANDAGES/DRESSINGS) ×1 IMPLANT
BNDG ESMARCH 4 X 12 STRL LF (GAUZE/BANDAGES/DRESSINGS)
BNDG ESMARCH 4X12 STRL LF (GAUZE/BANDAGES/DRESSINGS) ×1 IMPLANT
BNDG GAUZE DERMACEA FLUFF 4 (GAUZE/BANDAGES/DRESSINGS) ×1 IMPLANT
BNDG GZE 12X3 1 PLY HI ABS (GAUZE/BANDAGES/DRESSINGS)
BNDG GZE DERMACEA 4 6PLY (GAUZE/BANDAGES/DRESSINGS) ×1
BNDG STRETCH GAUZE 3IN X12FT (GAUZE/BANDAGES/DRESSINGS) ×1 IMPLANT
CANISTER WOUND CARE 500ML ATS (WOUND CARE) ×1 IMPLANT
CUFF TOURN SGL QUICK 12 (TOURNIQUET CUFF) IMPLANT
CUFF TOURN SGL QUICK 18X4 (TOURNIQUET CUFF) IMPLANT
DRAPE FLUOR MINI C-ARM 54X84 (DRAPES) IMPLANT
DRAPE XRAY CASSETTE 23X24 (DRAPES) IMPLANT
DRSG MEPILEX FLEX 3X3 (GAUZE/BANDAGES/DRESSINGS) IMPLANT
DURAPREP 26ML APPLICATOR (WOUND CARE) ×1 IMPLANT
ELECT REM PT RETURN 9FT ADLT (ELECTROSURGICAL) ×1
ELECTRODE REM PT RTRN 9FT ADLT (ELECTROSURGICAL) ×1 IMPLANT
GAUZE PACKING 0.25INX5YD STRL (GAUZE/BANDAGES/DRESSINGS) ×1 IMPLANT
GAUZE SPONGE 4X4 12PLY STRL (GAUZE/BANDAGES/DRESSINGS) ×1 IMPLANT
GAUZE STRETCH 2X75IN STRL (MISCELLANEOUS) ×1 IMPLANT
GAUZE XEROFORM 1X8 LF (GAUZE/BANDAGES/DRESSINGS) ×1 IMPLANT
GLOVE BIO SURGEON STRL SZ7.5 (GLOVE) ×1 IMPLANT
GLOVE INDICATOR 8.0 STRL GRN (GLOVE) ×1 IMPLANT
GOWN STRL REUS W/ TWL LRG LVL3 (GOWN DISPOSABLE) ×1 IMPLANT
GOWN STRL REUS W/TWL LRG LVL3 (GOWN DISPOSABLE) ×1
GOWN STRL REUS W/TWL MED LVL3 (GOWN DISPOSABLE) ×1 IMPLANT
GOWN STRL REUS W/TWL XL LVL4 (GOWN DISPOSABLE) ×1 IMPLANT
HANDPIECE VERSAJET 45 DEG 14 (MISCELLANEOUS) IMPLANT
HANDPIECE VERSAJET DEBRIDEMENT (MISCELLANEOUS) IMPLANT
IV NS 1000ML (IV SOLUTION) ×1
IV NS 1000ML BAXH (IV SOLUTION) ×1 IMPLANT
IV NS IRRIG 3000ML ARTHROMATIC (IV SOLUTION) ×1 IMPLANT
KIT DRSG VAC SLVR GRANUFM (MISCELLANEOUS) ×1 IMPLANT
KIT STIMULAN RAPID CURE 5CC (Orthopedic Implant) IMPLANT
KIT TURNOVER KIT A (KITS) ×1 IMPLANT
LABEL OR SOLS (LABEL) ×1 IMPLANT
MANIFOLD NEPTUNE II (INSTRUMENTS) ×1 IMPLANT
NDL BIOPSY JAMSHIDI 11X6 (NEEDLE) IMPLANT
NDL FILTER BLUNT 18X1 1/2 (NEEDLE) ×1 IMPLANT
NDL HYPO 25X1 1.5 SAFETY (NEEDLE) ×1 IMPLANT
NEEDLE BIOPSY JAMSHIDI 11X6 (NEEDLE) ×1 IMPLANT
NEEDLE FILTER BLUNT 18X1 1/2 (NEEDLE) ×1 IMPLANT
NEEDLE HYPO 25X1 1.5 SAFETY (NEEDLE) ×1 IMPLANT
NS IRRIG 500ML POUR BTL (IV SOLUTION) ×1 IMPLANT
PACK EXTREMITY ARMC (MISCELLANEOUS) ×1 IMPLANT
PACKING GAUZE IODOFORM 1INX5YD (GAUZE/BANDAGES/DRESSINGS) ×1 IMPLANT
PAD ABD DERMACEA PRESS 5X9 (GAUZE/BANDAGES/DRESSINGS) ×1 IMPLANT
PULSAVAC PLUS IRRIG FAN TIP (DISPOSABLE)
RASP SM TEAR CROSS CUT (RASP) IMPLANT
SHIELD FULL FACE ANTIFOG 7M (MISCELLANEOUS) ×1 IMPLANT
STOCKINETTE IMPERVIOUS 9X36 MD (GAUZE/BANDAGES/DRESSINGS) ×1 IMPLANT
SUT ETHILON 2 0 FS 18 (SUTURE) ×2 IMPLANT
SUT ETHILON 4-0 (SUTURE)
SUT ETHILON 4-0 FS2 18XMFL BLK (SUTURE)
SUT VIC AB 3-0 SH 27 (SUTURE)
SUT VIC AB 3-0 SH 27X BRD (SUTURE) ×1 IMPLANT
SUT VIC AB 4-0 FS2 27 (SUTURE) ×1 IMPLANT
SUTURE ETHLN 4-0 FS2 18XMF BLK (SUTURE) ×1 IMPLANT
SWAB CULTURE AMIES ANAERIB BLU (MISCELLANEOUS) IMPLANT
SYR 10ML LL (SYRINGE) ×1 IMPLANT
SYR 3ML LL SCALE MARK (SYRINGE) ×1 IMPLANT
TIP FAN IRRIG PULSAVAC PLUS (DISPOSABLE) ×1 IMPLANT
TRAP FLUID SMOKE EVACUATOR (MISCELLANEOUS) ×1 IMPLANT
WATER STERILE IRR 500ML POUR (IV SOLUTION) ×1 IMPLANT

## 2023-01-23 NOTE — H&P (View-Only) (Signed)
  Progress Note    01/23/2023 9:58 AM Day of Surgery  Subjective:  Patrick Downs is a 47 yo male who who was admitted for right lower extremity ulcer formation of his right lateral foot. He endorses it has worsened over the past week. No complaints overnight and vitals all remain stable.    Vitals:   01/23/23 0830 01/23/23 0900  BP:    Pulse:    Resp: 19   Temp:    SpO2: 93% 90%   Physical Exam: Cardiac:  RRR, S1, S2. No murmurs appreciated.  Lungs:  Clear on auscultation, No rales, rhonchi or wheezing Incisions:  None Extremities:  Unable to palpate Right and Left lower extremity pulses.  Abdomen:  Positive bowel sounds throughout, Soft non tender and non distended Neurologic: AAOX4, Follows commands and answers questions.   CBC    Component Value Date/Time   WBC 5.9 01/23/2023 0749   RBC 3.12 (L) 01/23/2023 0749   HGB 9.3 (L) 01/23/2023 0749   HGB 7.7 (L) 08/22/2014 0653   HCT 27.9 (L) 01/23/2023 0749   HCT 23.9 (L) 08/22/2014 0653   PLT 134 (L) 01/23/2023 0749   PLT 195 08/22/2014 0653   MCV 89.4 01/23/2023 0749   MCV 92 08/22/2014 0653   MCH 29.8 01/23/2023 0749   MCHC 33.3 01/23/2023 0749   RDW 14.7 01/23/2023 0749   RDW 17.3 (H) 08/22/2014 0653   LYMPHSABS 1.1 01/23/2023 0749   LYMPHSABS 0.9 (L) 08/22/2014 0653   MONOABS 0.7 01/23/2023 0749   MONOABS 1.6 (H) 08/22/2014 0653   EOSABS 0.1 01/23/2023 0749   EOSABS 0.0 08/22/2014 0653   BASOSABS 0.0 01/23/2023 0749   BASOSABS 0.0 08/22/2014 0653    BMET    Component Value Date/Time   NA 135 01/23/2023 0749   NA 136 08/22/2014 0653   K 4.6 01/23/2023 0749   K 5.1 08/22/2014 0653   CL 97 (L) 01/23/2023 0749   CL 95 (L) 08/22/2014 0653   CO2 22 01/23/2023 0749   CO2 27 08/22/2014 0653   GLUCOSE 90 01/23/2023 0749   GLUCOSE 176 (H) 08/22/2014 0653   BUN 77 (H) 01/23/2023 0749   BUN 71 (H) 08/22/2014 0653   CREATININE 15.15 (H) 01/23/2023 0749   CREATININE 8.87 (H) 08/22/2014 0653   CALCIUM 6.5 (L)  01/23/2023 0749   CALCIUM 7.1 (L) 08/22/2014 0653   GFRNONAA 4 (L) 01/23/2023 0749   GFRNONAA 7 (L) 08/22/2014 0653   GFRAA NOT CALCULATED 07/31/2022 1530   GFRAA 8 (L) 08/22/2014 0653    INR    Component Value Date/Time   INR 1.2 01/21/2023 0451   INR 1.0 08/12/2014 0645     Intake/Output Summary (Last 24 hours) at 01/23/2023 0958 Last data filed at 01/22/2023 1500 Gross per 24 hour  Intake 560 ml  Output --  Net 560 ml     Assessment/Plan:  47 y.o. male is admitted for right lower extremity ulceration.  Day of Surgery   PLAN: Vascular surgery plans on taking the patient to the vascular lab on Wednesday, 01/24/2023 for right lower extremity angiogram with possible intervention.   DVT prophylaxis:  Heparin with Dialysis   Marcie Bal [facility] 01/23/2023 9:58 AM

## 2023-01-23 NOTE — Anesthesia Procedure Notes (Signed)
Date/Time: 01/23/2023 12:40 PM  Performed by: Ginger Carne, CRNAPre-anesthesia Checklist: Patient identified, Emergency Drugs available, Suction available, Patient being monitored and Timeout performed Patient Re-evaluated:Patient Re-evaluated prior to induction Oxygen Delivery Method: Simple face mask Preoxygenation: Pre-oxygenation with 100% oxygen Induction Type: IV induction

## 2023-01-23 NOTE — Progress Notes (Signed)
[facility]  ROUNDING NOTE   Subjective:   Patient seen and evaluated during dialysis   HEMODIALYSIS FLOWSHEET:  Blood Flow Rate (mL/min): 399 mL/min Arterial Pressure (mmHg): -176.36 mmHg Venous Pressure (mmHg): 161.61 mmHg TMP (mmHg): 11.31 mmHg Ultrafiltration Rate (mL/min): 828 mL/min Dialysate Flow Rate (mL/min): 299 ml/min Dialysis Fluid Bolus: Normal Saline  Tolerating treatment well  Objective:  Vital signs in last 24 hours:  Temp:  [98 F (36.7 C)-99.4 F (37.4 C)] 98.6 F (37 C) (10/01 0735) Pulse Rate:  [71-83] 71 (10/01 1000) Resp:  [0-20] 0 (10/01 1000) BP: (99-113)/(53-74) 99/57 (10/01 1000) SpO2:  [90 %-100 %] 95 % (10/01 1000) Weight:  [124.6 kg] 124.6 kg (10/01 0746)  Weight change:  Filed Weights   01/21/23 0423 01/23/23 0746  Weight: 119 kg 124.6 kg    Intake/Output: I/O last 3 completed shifts: In: 660 [P.O.:360; IV Piggyback:300] Out: -    Intake/Output this shift:  No intake/output data recorded.  Physical Exam: General: No acute distress  Head: Normocephalic, atraumatic. Moist oral mucosal membranes  Neck: Supple  Lungs:  Clear to auscultation, normal effort  Heart: S1S2 no rubs  Abdomen:  Soft, nontender, bowel sounds present  Extremities: Trace peripheral edema.  Neurologic: Awake, alert, following commands  Skin: No acute rash, right foot wound  Access: Right IJ PermCath    Basic Metabolic Panel: Recent Labs  Lab 01/21/23 0451 01/22/23 0501 01/23/23 0749  NA 140 135 135  K 3.8 4.7 4.6  CL 109 97* 97*  CO2 21* 23 22  GLUCOSE 91 111* 90  BUN 31* 56* 77*  CREATININE 7.66* 13.12* 15.15*  CALCIUM 5.2* 6.7* 6.5*  MG 1.6*  --   --   PHOS 2.6  --   --     Liver Function Tests: Recent Labs  Lab 01/21/23 0451 01/22/23 0501  AST 26 12*  ALT 10 14  ALKPHOS 34* 47  BILITOT 1.5* 0.9  PROT 5.6* 6.7  ALBUMIN 2.7* 3.1*   No results for input(s): "LIPASE", "AMYLASE" in the last 168 hours. No results for  input(s): "AMMONIA" in the last 168 hours.  CBC: Recent Labs  Lab 01/21/23 0451 01/22/23 0501 01/23/23 0749  WBC 8.1 5.5 5.9  NEUTROABS 6.2  --  4.0  HGB 10.2* 10.0* 9.3*  HCT 32.4* 30.7* 27.9*  MCV 93.4 90.6 89.4  PLT 150 137* 134*    Cardiac Enzymes: No results for input(s): "CKTOTAL", "CKMB", "CKMBINDEX", "TROPONINI" in the last 168 hours.  BNP: Invalid input(s): "POCBNP"  CBG: Recent Labs  Lab 01/21/23 2053 01/22/23 0821 01/22/23 1139 01/22/23 1536 01/22/23 2045  GLUCAP 122* 99 106* 85 113*    Microbiology: Results for orders placed or performed during the hospital encounter of 01/21/23  Blood Culture (routine x 2)     Status: None (Preliminary result)   Collection Time: 01/21/23  5:08 AM   Specimen: BLOOD  Result Value Ref Range Status   Specimen Description BLOOD BLOOD RIGHT ARM  Final   Special Requests   Final    BOTTLES DRAWN AEROBIC AND ANAEROBIC Blood Culture results may not be optimal due to an inadequate volume of blood received in culture bottles   Culture   Final    NO GROWTH 2 DAYS Performed at [facility], [address], [place], [place] [address]    Report Status PENDING  Incomplete  Blood Culture (routine x 2)     Status: None (Preliminary result)   Collection Time: 01/21/23  5:31 AM   Specimen: BLOOD  Result Value Ref Range Status   Specimen Description BLOOD BLOOD LEFT ARM  Final   Special Requests   Final    BOTTLES DRAWN AEROBIC AND ANAEROBIC Blood Culture results may not be optimal due to an inadequate volume of blood received in culture bottles   Culture   Final    NO GROWTH 2 DAYS Performed at [facility], [address] [place], [place] [address]    Report Status PENDING  Incomplete    Coagulation Studies: Recent Labs    01/21/23 0451  LABPROT 15.5*  INR 1.2    Urinalysis: No results for input(s): "COLORURINE", "LABSPEC", "PHURINE", "GLUCOSEU", "HGBUR", "BILIRUBINUR", "KETONESUR", "PROTEINUR",  "UROBILINOGEN", "NITRITE", "LEUKOCYTESUR" in the last 72 hours.  Invalid input(s): "APPERANCEUR"    Imaging: MR FOOT RIGHT WO CONTRAST  Result Date: 01/21/2023 CLINICAL DATA:  Lateral foot wound. Soft tissue infection suspected. History of end-stage renal disease with foot pain, erythema and low level fever for 1 month. EXAM: MRI OF THE RIGHT FOREFOOT WITHOUT CONTRAST TECHNIQUE: Multiplanar, multisequence MR imaging of the right forefoot was performed. No intravenous contrast was administered. COMPARISON:  Radiographs 01/21/2023.  No other comparison studies. FINDINGS: Bones/Joint/Cartilage As seen on the earlier radiographs, there is soft tissue ulceration lateral to the base of the 5th metatarsal. There is underlying marrow T2 hyperintensity and T1 hypointensity laterally in the base of the 5th metatarsal, suspicious for osteomyelitis. This area is included on the coronal and sagittal images, but not fully covered on the transverse axial images. No other evidence of osteomyelitis within the forefoot. There are postsurgical changes from previous amputations through the head of the 1st proximal phalanx, at the 3rd proximal interphalangeal joint and at the 5th metatarsophalangeal joint. No significant joint effusions. Ligaments Intact Lisfranc ligament. The collateral ligaments of the remaining metatarsophalangeal joints appear intact. Muscles and Tendons Generalized forefoot muscular atrophy without focal fluid collection or significant tenosynovitis. Soft tissues As above, soft tissue ulceration lateral to the base of the 5th metatarsal with surrounding skin thickening and subcutaneous edema. No focal fluid collection is demonstrated. There is mild generalized subcutaneous edema throughout the dorsal and lateral aspect of the forefoot. IMPRESSION: 1. Soft tissue ulceration lateral to the base of the 5th metatarsal with underlying marrow changes in the base of the 5th metatarsal suspicious for osteomyelitis.  2. No evidence of soft tissue abscess. 3. Postsurgical changes as described. Electronically Signed   By: Carey Bullocks M.D.   On: 01/21/2023 11:28     Medications:    sodium chloride     ceFEPime (MAXIPIME) IV 1 g (01/22/23 0911)   metronidazole 500 mg (01/22/23 2139)   vancomycin      (feeding supplement) PROSource Plus  30 mL Oral TID BM   vitamin C  500 mg Oral BID   Chlorhexidine Gluconate Cloth  6 each Topical Daily   heparin  5,000 Units Subcutaneous Q8H   insulin aspart  0-6 Units Subcutaneous TID WC   multivitamin  1 tablet Oral QHS   nutrition supplement (JUVEN)  1 packet Oral BID BM   sodium chloride flush  3 mL Intravenous Q12H   zinc sulfate  220 mg Oral Daily   sodium chloride, acetaminophen, hydrOXYzine, oxyCODONE-acetaminophen **AND** oxyCODONE, sodium chloride flush  Assessment/ Plan:  47 y.o. male ESRD on HD TTS, CHF, history of Guillain-Barr syndrome, hypertension, anemia of chronic kidney disease, secondary hyperparathyroidism, history of right foot toe amputation who presents with ulceration on lateral  aspect of right foot.  [facility]  1.  ESRD on HD TTS.  Receiving dialysis today, UF 2L as tolerated. Next treatment scheduled for Thursday.   2.  Anemia of chronic kidney disease.   Lab Results  Component Value Date   HGB 9.3 (L) 01/23/2023   Patient receives Mircera as outpatient.  Hemoglobin 9.3, at goal  3.  Secondary hyperparathyroidism.  Calcium remains decreased. Will correct slowly with dialysis. Patient prescribed auryxia with meals outpatient   4.  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  Currently on vancomycin and metronidazole.  Imaging suspicious for osteomyelitis.  Podiatry will perform debridement later today.      LOS: 2   10/1/202410:45 AM

## 2023-01-23 NOTE — H&P (Signed)
HISTORY AND PHYSICAL INTERVAL NOTE:  01/23/2023  12:12 PM  Patrick Downs  has presented today for surgery, with the diagnosis of RIGHT FOOT GANGRENE.  The various methods of treatment have been discussed with the patient.  No guarantees were given.  After consideration of risks, benefits and other options for treatment, the patient has consented to surgery.  I have reviewed the patients' chart and labs.     The patient was reexamined.  There have been no changes to this history and physical examination.  Patrick Downs

## 2023-01-23 NOTE — Progress Notes (Signed)
Progress Note   Patient: Patrick Downs NWG:[id number] DOB: 1976/03/31 DOA: 01/21/2023     2 DOS: the patient was seen and examined on 01/23/2023      Subjective:  Patient seen and examined at bedside this morning Denies nausea vomiting abdominal pain or chest pain Patient underwent debridement of the right foot with bone biopsy today     Brief hospital course: From HPI "Patrick Downs is a 47 y.o. male with medical history significant of ESRD on hemodialysis Tuesday Thursday Saturday, GERD, hypertension, gout, type 2 diabetes, peripheral neuropathy, prior toe amputation presenting with right lower extremity cellulitis.  Patient reports ulcer formation on the right lateral foot.  Has had worsening drainage over multiple days.  Mom malaise.  Patient does wear a brace on the right foot.  Has been unable to wear brace secondary to swelling.  Noted malodorous drainage from wound.  No fevers or chills.  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  Non-smoker.  Patient denies any alcohol use.  Has been compliant with hemodialysis regimen. Presented to the ER Tmax 99.4, blood pressures 90s to 110s over 50s to 70s.  Satting well on room air.  White count 8.1, hemoglobin 10.2, platelets 150.  Creatinine 7.7, T. bili 1.5.  Right foot plain films with superficial soft tissue wound with subcutaneous gas adjacent to the base of the fifth metatarsal bone.  "   Assessment and Plan:   Cellulitis of the right fourth toe with underlying osteomyelitis Right lateral foot cellulitis in setting of end-stage renal disease with prior amputations in the past MRI of the right foot showing soft tissue ulceration lateral to the base of the fifth metatarsal with underlying marrow changes at the base of the fifth metatarsal suspicious for osteomyelitis.  No abscess detected Podiatry on board and patient was taken for bone biopsy and debridement today Continue to follow-up on culture results Continue current  antibiotic therapy Wound care on board we appreciate input     Type 2 diabetes mellitus with peripheral neuropathy (HCC) Continue insulin management Continue monitoring glucose closely   ESRD on dialysis (HCC) Baseline ESRD on HD TTS  No reported missed episodes of HD  Nephrologist on board we appreciate input     Polyneuropathy, unspecified Continue Lyrica   GERD (gastroesophageal reflux disease) PPI      Advance Care Planning:   Code Status: Full Code    Consults: nephrology    Family Communication: Family at the bedside-sleeping        Physical Exam:    Appearance: He is normal weight.  HENT:     Head: Normocephalic and atraumatic.     Nose: Nose normal.     Mouth/Throat:     Mouth: Mucous membranes are moist.  Cardiovascular:     Rate and Rhythm: Normal rate and regular rhythm.  Pulmonary:     Effort: Pulmonary effort is normal.  Abdominal:     General: Bowel sounds are normal.  Musculoskeletal:     Comments: Right foot wound noted with dressing in place      Data Reviewed: I have reviewed patient's lab results as shown below as well as vascular surgeon and podiatrist documentation    Latest Ref Rng & Units 01/23/2023    7:49 AM 01/22/2023    5:01 AM 01/21/2023    4:51 AM  CBC  WBC 4.0 - 10.5 K/uL 5.9  5.5  8.1   Hemoglobin 13.0 - 17.0 g/dL 9.3  57.8  10.2   Hematocrit 39.0 - 52.0 % 27.9  30.7  32.4   Platelets 150 - 400 K/uL 134  137  150        Latest Ref Rng & Units 01/23/2023    7:49 AM 01/22/2023    5:01 AM 01/21/2023    4:51 AM  BMP  Glucose 70 - 99 mg/dL 90  109  91   BUN 6 - 20 mg/dL 77  56  31   Creatinine 0.61 - 1.24 mg/dL 32.35  57.32  2.02   Sodium 135 - 145 mmol/L 135  135  140   Potassium 3.5 - 5.1 mmol/L 4.6  4.7  3.8   Chloride 98 - 111 mmol/L 97  97  109   CO2 22 - 32 mmol/L 22  23  21    Calcium 8.9 - 10.3 mg/dL 6.5  6.7  5.2      Vitals:   01/23/23 1400 01/23/23 1415 01/23/23 1430 01/23/23 1511  BP: 96/64 108/64 (!) 90/56  (!) 92/59  Pulse: 73 82 84 83  Resp: 14 20 19 18   Temp:    97.9 F (36.6 C)  TempSrc:      SpO2: 96% 94% 93% 95%  Weight:      Height:        Author: Loyce Dys, MD 01/23/2023 6:23 PM  For on call review [email or web address].

## 2023-01-23 NOTE — Progress Notes (Signed)
  Progress Note    01/23/2023 9:58 AM Day of Surgery  Subjective:  Patrick Downs is a 48 yo male who who was admitted for right lower extremity ulcer formation of his right lateral foot. He endorses it has worsened over the past week. No complaints overnight and vitals all remain stable.    Vitals:   01/23/23 0830 01/23/23 0900  BP:    Pulse:    Resp: 19   Temp:    SpO2: 93% 90%   Physical Exam: Cardiac:  RRR, S1, S2. No murmurs appreciated.  Lungs:  Clear on auscultation, No rales, rhonchi or wheezing Incisions:  None Extremities:  Unable to palpate Right and Left lower extremity pulses.  Abdomen:  Positive bowel sounds throughout, Soft non tender and non distended Neurologic: AAOX4, Follows commands and answers questions.   CBC    Component Value Date/Time   WBC 5.9 01/23/2023 0749   RBC 3.12 (L) 01/23/2023 0749   HGB 9.3 (L) 01/23/2023 0749   HGB 7.7 (L) 08/22/2014 0653   HCT 27.9 (L) 01/23/2023 0749   HCT 23.9 (L) 08/22/2014 0653   PLT 134 (L) 01/23/2023 0749   PLT 195 08/22/2014 0653   MCV 89.4 01/23/2023 0749   MCV 92 08/22/2014 0653   MCH 29.8 01/23/2023 0749   MCHC 33.3 01/23/2023 0749   RDW 14.7 01/23/2023 0749   RDW 17.3 (H) 08/22/2014 0653   LYMPHSABS 1.1 01/23/2023 0749   LYMPHSABS 0.9 (L) 08/22/2014 0653   MONOABS 0.7 01/23/2023 0749   MONOABS 1.6 (H) 08/22/2014 0653   EOSABS 0.1 01/23/2023 0749   EOSABS 0.0 08/22/2014 0653   BASOSABS 0.0 01/23/2023 0749   BASOSABS 0.0 08/22/2014 0653    BMET    Component Value Date/Time   NA 135 01/23/2023 0749   NA 136 08/22/2014 0653   K 4.6 01/23/2023 0749   K 5.1 08/22/2014 0653   CL 97 (L) 01/23/2023 0749   CL 95 (L) 08/22/2014 0653   CO2 22 01/23/2023 0749   CO2 27 08/22/2014 0653   GLUCOSE 90 01/23/2023 0749   GLUCOSE 176 (H) 08/22/2014 0653   BUN 77 (H) 01/23/2023 0749   BUN 71 (H) 08/22/2014 0653   CREATININE 15.15 (H) 01/23/2023 0749   CREATININE 8.87 (H) 08/22/2014 0653   CALCIUM 6.5 (L)  01/23/2023 0749   CALCIUM 7.1 (L) 08/22/2014 0653   GFRNONAA 4 (L) 01/23/2023 0749   GFRNONAA 7 (L) 08/22/2014 0653   GFRAA NOT CALCULATED 07/31/2022 1530   GFRAA 8 (L) 08/22/2014 0653    INR    Component Value Date/Time   INR 1.2 01/21/2023 0451   INR 1.0 08/12/2014 0645     Intake/Output Summary (Last 24 hours) at 01/23/2023 0958 Last data filed at 01/22/2023 1500 Gross per 24 hour  Intake 560 ml  Output --  Net 560 ml     Assessment/Plan:  47 y.o. male is admitted for right lower extremity ulceration.  Day of Surgery   PLAN: Vascular surgery plans on taking the patient to the vascular lab on Wednesday, 01/24/2023 for right lower extremity angiogram with possible intervention.   DVT prophylaxis:  Heparin with Dialysis   Marcie Bal [facility] 01/23/2023 9:58 AM

## 2023-01-23 NOTE — Transfer of Care (Signed)
Immediate Anesthesia Transfer of Care Note  Patient: Patrick Downs  Procedure(s) Performed: IRRIGATION AND DEBRIDEMENT FOOT (Right)  Patient Location: PACU  Anesthesia Type:General  Level of Consciousness: awake, alert , and oriented  Airway & Oxygen Therapy: Patient Spontanous Breathing  Post-op Assessment: Report given to RN and Post -op Vital signs reviewed and stable  Post vital signs: Reviewed and stable  Last Vitals:  Vitals Value Taken Time  BP 94/53 01/23/23 1342  Temp    Pulse 73 01/23/23 1342  Resp 8 01/23/23 1342  SpO2 98 % 01/23/23 1342  Vitals shown include unfiled device data.  Last Pain:  Vitals:   01/23/23 1221  TempSrc: Temporal  PainSc: 9          Complications: No notable events documented.

## 2023-01-23 NOTE — Anesthesia Preprocedure Evaluation (Addendum)
Anesthesia Evaluation  Patient identified by MRN, date of birth, ID band Patient awake    Reviewed: Allergy & Precautions, H&P , NPO status , Patient's Chart, lab work & pertinent test results  Airway Mallampati: III  TM Distance: >3 FB Neck ROM: full    Dental no notable dental hx.    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 Pt with atrophy of muscles noted in hands and forearms. Pt report foot drop  Neuromuscular disease (Peripheral Neuropathy)  negative psych ROS   GI/Hepatic negative GI ROS, Neg liver ROS,,,  Endo/Other  diabetes, Type 2    Renal/GU Dialysis and ESRFRenal disease  negative genitourinary   Musculoskeletal   Abdominal  (+) + obese  Peds  Hematology  (+) Blood dyscrasia, anemia   Anesthesia Other Findings Receiving dialysis today, UF 2L as tolerated. 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[facility]) No date: Heart murmur 12/24/2020: Hip pain, chronic, left No date: HTN (hypertension) No date: Hyperparathyroidism (HCC) No date: Kidney failure No date: MRSA (methicillin resistant staph aureus) culture positive No date: Peripheral neuropathy No date: Pneumonia No date: Renal insufficiency No date: Respiratory failure [facility])  Past Surgical History: 01/07/2018: A/V FISTULAGRAM; Right     Comment:  Procedure: A/V FISTULAGRAM;  Surgeon: Annice Needy, MD;               Location: [facility];   Service: Cardiovascular;              Laterality: Right; 06/08/2017: AMPUTATION TOE; Right     Comment:  Procedure: AMPUTATION TOE RIGHT FIFTH TOE;  Surgeon:               Gwyneth Revels, DPM;  Location: [facility] OR[facility];  Service:               Podiatry;  Laterality: Right; 05/17/2018: AMPUTATION TOE; Left     Comment:  Procedure: RAY LEFT 5TH;  Surgeon: Gwyneth Revels, DPM;               Location: [facility];  Service: Podiatry;  Laterality:               Left; 03/05/2020: AMPUTATION TOE; Right     Comment:  Procedure: AMPUTATION TOE IPJ X 2 RIGHT GREAT AND 3RD;                Surgeon: Gwyneth Revels, DPM;  Location: [facility];                Service: Podiatry;  Laterality: Right; No date: AV FISTULA PLACEMENT     Comment:  x5      2 graphs 04/08/2020: AV FISTULA PLACEMENT; Right     Comment:  Procedure: INSERTION OF ARTERIOVENOUS (AV) GORE-TEX               GRAFT ARM ( ARTEGRAFT);  Surgeon: Annice Needy, MD;                Location: [facility];  Service:  Vascular;  Laterality:               Right; 01/20/2019: DIALYSIS/PERMA CATHETER INSERTION; N/A     Comment:  Procedure: DIALYSIS/PERMA CATHETER INSERTION;  Surgeon:               Annice Needy, MD;  Location: [facility];                Service: Cardiovascular;  Laterality: N/A; 06/17/2018: DIALYSIS/PERMA CATHETER REMOVAL; N/A     Comment:  Procedure: DIALYSIS/PERMA CATHETER REMOVAL;  Surgeon:               Annice Needy, MD;  Location: [facility];                Service: Cardiovascular;  Laterality: N/A; No date: PARATHYROIDECTOMY 01/22/2019: PERIPHERAL VASCULAR THROMBECTOMY; Right     Comment:  Procedure: PERIPHERAL VASCULAR THROMBECTOMY;  Surgeon:               Annice Needy, MD;  Location: [facility];                Service: Cardiovascular;  Laterality: Right; 05/16/2020: REMOVAL OF GRAFT; Right     Comment:  Procedure: REMOVAL OF GRAFT;  Surgeon: Fransisco Hertz,               MD;  Location: [facility];  Service:  Vascular;  Laterality:              Right; No date: RENAL BIOPSY 02/07/2018: REVISON OF ARTERIOVENOUS FISTULA; Right     Comment:  Procedure: REVISON OF ARTERIOVENOUS FISTULA;  Surgeon:               Annice Needy, MD;  Location: [facility];  Service:               Vascular;  Laterality: Right; 01/22/2018: TEE WITHOUT CARDIOVERSION; N/A     Comment:  Procedure: TRANSESOPHAGEAL ECHOCARDIOGRAM (TEE);                Surgeon: Laurier Nancy, MD;  Location: [facility];                Service: Cardiovascular;  Laterality: N/A; No date: tonsiilectomy No date: TONSILLECTOMY No date: tracheotomy 02/23/2020: UPPER EXTREMITY VENOGRAPHY; Bilateral     Comment:  Procedure: UPPER EXTREMITY VENOGRAPHY;  Surgeon: Annice Needy, MD;  Location: [facility];  Service:               Cardiovascular;  Laterality: Bilateral;  BMI    Body Mass Index: 34.67 kg/m      Reproductive/Obstetrics negative OB ROS                             Anesthesia Physical Anesthesia Plan  ASA: 3  Anesthesia Plan: General   Post-op Pain Management:    Induction: Intravenous  PONV Risk Score and Plan: Propofol infusion and TIVA  Airway Management Planned: Natural Airway  Additional Equipment:   Intra-op Plan:   Post-operative Plan:   Informed Consent: I have reviewed the patients History and Physical, chart, labs and discussed the procedure including the risks, benefits and alternatives for the proposed anesthesia with the patient or authorized representative who has indicated his/her understanding and acceptance.     Dental Advisory Given  Plan Discussed with: CRNA and Surgeon  Anesthesia Plan Comments:         Anesthesia Quick Evaluation

## 2023-01-23 NOTE — Progress Notes (Signed)
  Received patient in bed to unit.   Informed consent signed and in chart.    TX duration: 3:18.  Pt taken off 12 min early d/t surgery debridement of R foot      Transported to same day surgery Hand-off given to patient's nurse and same day surgery nurse.  Pt states pain is 7/10 of R foot    Access used: R HD Catheter  Access issues: none   Total UF removed: 1.9L Medication(s) given: none Post HD VS: 126/660 Post HD weight: 122.5kg     Lynann Beaver  [facility]  `

## 2023-01-23 NOTE — Op Note (Addendum)
Operative note   Surgeon:Khalis Hittle [employer] forces logistics/support/administrative officer: None    Preop diagnosis: Gangrene ulcer right 2.  Bone biopsy right fifth metatarsal 3.  Placement of DME type wound VAC right foot with osteomyelitis    Postop diagnosis: Same    Procedure: 1.  Excisional debridement ulcer to bone right foot fifth metatarsal 2.  Bone biopsy right fifth metatarsal 3.  Placement of wound VAC right foot 4. Placement of antibiotic (vancomycin) beads right foot.    EBL: Minimal    Anesthesia:local and IV sedation.  Local consists of a total of 20 cc of 1% lidocaine with epinephrine infiltrated around the wound site    Hemostasis: Lidocaine with epinephrine    Specimen: Deep bone biopsy for culture, deep bone biopsy for pathology, swab for wound culture    Complications: None    Operative indications:Patrick Downs is an 47 y.o. that presents today for surgical intervention.  The risks/benefits/alternatives/complications have been discussed and consent has been given.    Procedure:  Patient was brought into the OR and placed on the operating table in thesupine position. After anesthesia was obtained theright lower extremity was prepped and draped in usual sterile fashion.  Attention was directed to the lateral aspect of the right foot where a large necrotic ulceration was noted.  Full-thickness incision and dissection was carried around circumferential around the ulcerative site.  Dissection and debridement was carried down to the level of bone.  A wound culture was initially taken with a sterile swab.  Next with a Versajet full-thickness excisional debridement down to and including the lateral cortex of the bone was excised.  The wound measured 4 cm x 3 cm in diameter with a depth of 1 cm.  At this time there was not noted to be any large areas of purulent drainage.  Multiple passes with a Jamshidi needle were used to remove areas of the deep bone and lateral cortex at the base of the fifth metatarsal.   Tissue was sent for both pathological examination wound culture..  At this time the multiple passes with a Jamshidi needle were filled with Stimulan impregnated calcium sulfate with vancomycin beads.  The lateral cortex was also covered with vancomycin impregnated stimulan bone filler.  After all bleeding was cauterized a silver impregnated foam for the DME type wound VAC was applied to the lateral foot.  This was held at 125 mm of continuous compression.  A bulky sterile dressing was applied.    Patient tolerated the procedure and anesthesia well.  Was transported from the OR to the PACU with all vital signs stable and vascular status intact. To be discharged per routine protocol.  Will follow up in approximately 1 week in the outpatient clinic.  I will place consult for ID for possible IV abx based on wound/bone cultures and pathology.

## 2023-01-24 ENCOUNTER — Encounter: Payer: Self-pay | Admitting: Podiatry

## 2023-01-24 ENCOUNTER — Encounter
Admission: EM | Disposition: A | Discharge status: Home Health | Payer: Self-pay | Source: Home / Self Care | Attending: Internal Medicine

## 2023-01-24 DIAGNOSIS — N186 End stage renal disease: Secondary | ICD-10-CM | POA: Diagnosis not present

## 2023-01-24 DIAGNOSIS — L03115 Cellulitis of right lower limb: Secondary | ICD-10-CM | POA: Diagnosis not present

## 2023-01-24 DIAGNOSIS — I70235 Atherosclerosis of native arteries of right leg with ulceration of other part of foot: Secondary | ICD-10-CM | POA: Diagnosis not present

## 2023-01-24 DIAGNOSIS — E1142 Type 2 diabetes mellitus with diabetic polyneuropathy: Secondary | ICD-10-CM | POA: Diagnosis not present

## 2023-01-24 DIAGNOSIS — M86671 Other chronic osteomyelitis, right ankle and foot: Secondary | ICD-10-CM

## 2023-01-24 DIAGNOSIS — L97519 Non-pressure chronic ulcer of other part of right foot with unspecified severity: Secondary | ICD-10-CM | POA: Diagnosis not present

## 2023-01-24 DIAGNOSIS — G629 Polyneuropathy, unspecified: Secondary | ICD-10-CM | POA: Diagnosis not present

## 2023-01-24 DIAGNOSIS — L089 Local infection of the skin and subcutaneous tissue, unspecified: Secondary | ICD-10-CM | POA: Diagnosis not present

## 2023-01-24 DIAGNOSIS — E11628 Type 2 diabetes mellitus with other skin complications: Secondary | ICD-10-CM | POA: Diagnosis not present

## 2023-01-24 HISTORY — PX: LOWER EXTREMITY ANGIOGRAPHY: CATH118251

## 2023-01-24 LAB — CBC WITH DIFFERENTIAL/PLATELET
Abs Immature Granulocytes: 0.01 10*3/uL (ref 0.00–0.07)
Basophils Absolute: 0 10*3/uL (ref 0.0–0.1)
Basophils Relative: 1 %
Eosinophils Absolute: 0.1 10*3/uL (ref 0.0–0.5)
Eosinophils Relative: 3 %
HCT: 28 % — ABNORMAL LOW (ref 39.0–52.0)
Hemoglobin: 9.2 g/dL — ABNORMAL LOW (ref 13.0–17.0)
Immature Granulocytes: 0 %
Lymphocytes Relative: 20 %
Lymphs Abs: 0.9 10*3/uL (ref 0.7–4.0)
MCH: 29.3 pg (ref 26.0–34.0)
MCHC: 32.9 g/dL (ref 30.0–36.0)
MCV: 89.2 fL (ref 80.0–100.0)
Monocytes Absolute: 0.6 10*3/uL (ref 0.1–1.0)
Monocytes Relative: 13 %
Neutro Abs: 2.8 10*3/uL (ref 1.7–7.7)
Neutrophils Relative %: 63 %
Platelets: 130 10*3/uL — ABNORMAL LOW (ref 150–400)
RBC: 3.14 MIL/uL — ABNORMAL LOW (ref 4.22–5.81)
RDW: 14.4 % (ref 11.5–15.5)
WBC: 4.4 10*3/uL (ref 4.0–10.5)
nRBC: 0 % (ref 0.0–0.2)

## 2023-01-24 LAB — BASIC METABOLIC PANEL WITH GFR
Anion gap: 12 (ref 5–15)
BUN: 60 mg/dL — ABNORMAL HIGH (ref 6–20)
CO2: 25 mmol/L (ref 22–32)
Calcium: 6.6 mg/dL — ABNORMAL LOW (ref 8.9–10.3)
Chloride: 99 mmol/L (ref 98–111)
Creatinine, Ser: 12.31 mg/dL — ABNORMAL HIGH (ref 0.61–1.24)
GFR, Estimated: 5 mL/min — ABNORMAL LOW
Glucose, Bld: 102 mg/dL — ABNORMAL HIGH (ref 70–99)
Potassium: 4.6 mmol/L (ref 3.5–5.1)
Sodium: 136 mmol/L (ref 135–145)

## 2023-01-24 LAB — HEPATITIS B SURFACE ANTIBODY, QUANTITATIVE: Hep B S AB Quant (Post): 216 m[IU]/mL

## 2023-01-24 LAB — GLUCOSE, CAPILLARY
Glucose-Capillary: 85 mg/dL (ref 70–99)
Glucose-Capillary: 93 mg/dL (ref 70–99)
Glucose-Capillary: 216 mg/dL — ABNORMAL HIGH (ref 70–99)

## 2023-01-24 SURGERY — LOWER EXTREMITY ANGIOGRAPHY
Anesthesia: Moderate Sedation | Laterality: Right

## 2023-01-24 MED ORDER — DIPHENHYDRAMINE HCL 50 MG/ML IJ SOLN
50.0000 mg | Freq: Once | INTRAMUSCULAR | Status: AC | PRN
  Administered 2023-01-24: 50 mg via INTRAVENOUS

## 2023-01-24 MED ORDER — CEFAZOLIN SODIUM-DEXTROSE 2-4 GM/100ML-% IV SOLN: 2.0000 g | INTRAVENOUS | Status: DC

## 2023-01-24 MED ORDER — HYDROMORPHONE HCL 1 MG/ML IJ SOLN: 1.0000 mg | Freq: Once | INTRAMUSCULAR | Status: DC | PRN

## 2023-01-24 MED ORDER — MIDAZOLAM HCL 2 MG/2ML IJ SOLN
INTRAMUSCULAR | Status: DC | PRN
  Administered 2023-01-24 (×2): 1 mg via INTRAVENOUS

## 2023-01-24 MED ORDER — FENTANYL CITRATE (PF) 100 MCG/2ML IJ SOLN
INTRAMUSCULAR | Status: DC | PRN
  Administered 2023-01-24 (×3): 25 ug via INTRAVENOUS

## 2023-01-24 MED ORDER — FAMOTIDINE 20 MG PO TABS
40.0000 mg | ORAL_TABLET | Freq: Once | ORAL | Status: AC | PRN
  Administered 2023-01-24: 40 mg via ORAL

## 2023-01-24 MED ORDER — DIPHENHYDRAMINE HCL 50 MG/ML IJ SOLN
INTRAMUSCULAR | Status: AC
  Filled 2023-01-24: qty 1

## 2023-01-24 MED ORDER — SODIUM CHLORIDE 0.9 % IV SOLN: INTRAVENOUS | Status: DC

## 2023-01-24 MED ORDER — METHYLPREDNISOLONE SODIUM SUCC 125 MG IJ SOLR
INTRAMUSCULAR | Status: AC
  Filled 2023-01-24: qty 2

## 2023-01-24 MED ORDER — MIDAZOLAM HCL 2 MG/ML PO SYRP
8.0000 mg | ORAL_SOLUTION | Freq: Once | ORAL | Status: DC | PRN
  Filled 2023-01-24: qty 5

## 2023-01-24 MED ORDER — SODIUM CHLORIDE 0.9 % IV BOLUS: 250.0000 mL | Freq: Once | INTRAVENOUS | Status: DC

## 2023-01-24 MED ORDER — METHYLPREDNISOLONE SODIUM SUCC 125 MG IJ SOLR
125.0000 mg | Freq: Once | INTRAMUSCULAR | Status: AC | PRN
  Administered 2023-01-24: 125 mg via INTRAVENOUS

## 2023-01-24 MED ORDER — FENTANYL CITRATE (PF) 100 MCG/2ML IJ SOLN
INTRAMUSCULAR | Status: AC
  Filled 2023-01-24: qty 2

## 2023-01-24 MED ORDER — HEPARIN SODIUM (PORCINE) 1000 UNIT/ML IJ SOLN
INTRAMUSCULAR | Status: AC
  Filled 2023-01-24: qty 10

## 2023-01-24 MED ORDER — LIDOCAINE-EPINEPHRINE (PF) 1 %-1:200000 IJ SOLN
INTRAMUSCULAR | Status: DC | PRN
  Administered 2023-01-24: 10 mL via INTRADERMAL

## 2023-01-24 MED ORDER — ONDANSETRON HCL 4 MG/2ML IJ SOLN: 4.0000 mg | Freq: Four times a day (QID) | INTRAMUSCULAR | Status: DC | PRN

## 2023-01-24 MED ORDER — HEPARIN SODIUM (PORCINE) 1000 UNIT/ML IJ SOLN
INTRAMUSCULAR | Status: DC | PRN
  Administered 2023-01-24: 5000 [IU] via INTRAVENOUS

## 2023-01-24 MED ORDER — FAMOTIDINE 20 MG PO TABS
ORAL_TABLET | ORAL | Status: AC
  Filled 2023-01-24: qty 2

## 2023-01-24 MED ORDER — FENTANYL CITRATE PF 50 MCG/ML IJ SOSY: 12.5000 ug | PREFILLED_SYRINGE | Freq: Once | INTRAMUSCULAR | Status: DC | PRN

## 2023-01-24 MED ORDER — MIDAZOLAM HCL 2 MG/2ML IJ SOLN
INTRAMUSCULAR | Status: AC
  Filled 2023-01-24: qty 4

## 2023-01-24 SURGICAL SUPPLY — 16 items
BALLN LUTONIX 6X220X130 (BALLOONS) ×2
BALLN LUTONIX DCB 4X100X130 (BALLOONS) ×1
BALLOON LUTONIX 6X220X130 (BALLOONS) IMPLANT
BALLOON LUTONIX DCB 4X100X130 (BALLOONS) IMPLANT
CATH ANGIO 5F PIGTAIL 65CM (CATHETERS) IMPLANT
CATH NAVICROSS ANGLED 135CM (MICROCATHETER) IMPLANT
DEVICE STARCLOSE SE CLOSURE (Vascular Products) IMPLANT
GLIDEWIRE ADV .035X260CM (WIRE) IMPLANT
KIT ENCORE 26 ADVANTAGE (KITS) IMPLANT
LIFESTENT SOLO 7X200X135 (Permanent Stent) IMPLANT
PACK ANGIOGRAPHY (CUSTOM PROCEDURE TRAY) ×1 IMPLANT
SHEATH BRITE TIP 5FRX11 (SHEATH) IMPLANT
SHEATH HIGHFLEX ANSEL 6FRX55 (SHEATH) IMPLANT
SYR MEDRAD MARK 7 150ML (SYRINGE) IMPLANT
TUBING CONTRAST HIGH PRESS 72 (TUBING) IMPLANT
WIRE GUIDERIGHT .035X150 (WIRE) IMPLANT

## 2023-01-24 NOTE — Consult Note (Addendum)
Pharmacy Antibiotic Note  Patrick Downs is a 47 y.o. male admitted on 01/21/2023 with  Wound infection .  Pharmacy has been consulted for cefepime and vancomycin dosing. Afeb, WBC 8.1. PMH with ESRD, HTN, gout, DM, prior toe amputation. Patient reports ulcer formation on the right lateral foot. Hx of MSSA infection in 2022.   Plan: Day#4 of Abx Continue cefepime 1 mg IV daily Vancomycin 2500mg  LD dose given 9/29. Will resume Vancomycin 1000 mg on HD days. Will order Vancomycin random level tomorrow with AM labs. (Dose for 10/1 not given). Metronidazole also ordered by MD  Height: 6\' 2"  (188 cm) Weight: 122.5 kg (270 lb 1 oz) IBW/kg (Calculated) : 82.2  Temp (24hrs), Avg:98.1 F (36.7 C), Min:97.1 F (36.2 C), Max:98.9 F (37.2 C)  Recent Labs  Lab 01/21/23 0451 01/22/23 0501 01/23/23 0749 01/24/23 0458  WBC 8.1 5.5 5.9 4.4  CREATININE 7.66* 13.12* 15.15* 12.31*  LATICACIDVEN 1.5  --   --   --     Estimated Creatinine Clearance: 10.4 mL/min (A) (by C-G formula based on SCr of 12.31 mg/dL (H)).    Allergies  Allergen Reactions   Hepatitis B Vaccine     Other reaction(s): Unknown   Ondansetron Other (See Comments)    Stomach pain    Minoxidil Other (See Comments)    "put fluid around my heart", PERICARDIAL EFFUSION   Morphine And Codeine Other (See Comments)    Aggressive    Omnipaque [Iohexol] Itching and Other (See Comments)    Rigors on one occasion, widespread itching on a separate occasion (resolved with Benadryl), tremors    Antimicrobials this admission: 9/29 cefepime >>  9/29 vancomycin >>  9/29 metronidazole >>  Dose adjustments this admission: all meds adjusted for ESRD  Microbiology results: 9/29 BCx: NG x 2 days 10/1 Wound: pending   Thank you for allowing pharmacy to be a part of this patient's care.  Omega Durante Rodriguez-Guzman PharmD, BCPS 01/24/2023 8:59 AM

## 2023-01-24 NOTE — Progress Notes (Signed)
Nutrition Follow-up  DOCUMENTATION CODES:   Obesity unspecified  INTERVENTION:   -Continue Renal MVI daily -Continue 500 mg vitamin C BID -Continue 220 mg zinc sulfate daily x 14 days -Continue 30 ml Prosource Plus TID, each supplement provides 100 kcals and 15 grams protein -Continue 1 packet Juven BID, each packet provides 95 calories, 2.5 grams of protein (collagen), and 9.8 grams of carbohydrate (3 grams sugar); also contains 7 grams of L-arginine and L-glutamine, 300 mg vitamin C, 15 mg vitamin E, 1.2 mcg vitamin B-12, 9.5 mg zinc, 200 mg calcium, and 1.5 g  Calcium Beta-hydroxy-Beta-methylbutyrate to support wound healing   NUTRITION DIAGNOSIS:   Increased nutrient needs related to wound healing as evidenced by estimated needs.  Ongoing  GOAL:   Patient will meet greater than or equal to 90% of their needs  Progressing   MONITOR:   PO intake, Supplement acceptance  REASON FOR ASSESSMENT:   Consult Wound healing  ASSESSMENT:   Pt with medical history significant of ESRD on hemodialysis Tuesday Thursday Saturday, GERD, hypertension, gout, type 2 diabetes, peripheral neuropathy, prior toe amputation presenting with right lower extremity cellulitis.  Patient reports ulcer formation on the right lateral foot.  10/1- s/p 1.  Excisional debridement ulcer to bone right foot fifth metatarsal 2.  Bone biopsy right fifth metatarsal 3.  Placement of wound VAC right foot 4. Placement of antibiotic (vancomycin) beads right foot.  10/2- s/p Rt Lower Extremity Angiography   Reviewed I/O's: -1.8 L x 24 hours and -1.2 L since admission   Pt down in cath labs at time of visit.   Pt on a renal diet with 1.2 L fluid restriction. Pt has been consuming supplements; supplements remains appropriate to promote wound and post-operative healing.   Per podiatry notes, plan to re-examine wound tomorrow  No wt loss since admission.   Medications reviewed and include vitamin C and zinc  sulfate.   Labs reviewed: CBGS: 85-185 (inpatient orders for glycemic control are 0-6 units insulin aspart TID with meals).    Diet Order:   Diet Order             Diet renal with fluid restriction Fluid restriction: 1200 mL Fluid; Room service appropriate? Yes; Fluid consistency: Thin  Diet effective now                   EDUCATION NEEDS:   Education needs have been addressed  Skin:  Skin Assessment: Skin Integrity Issues: Skin Integrity Issues:: Wound VAC Wound Vac: rt foot Other: rt lateral foot full thickness wound with cellulitis  Last BM:  01/21/23  Height:   Ht Readings from Last 1 Encounters:  01/21/23 6\' 2"  (1.88 m)    Weight:   Wt Readings from Last 1 Encounters:  01/23/23 122.5 kg    Ideal Body Weight:  86.4 kg  BMI:  Body mass index is 34.67 kg/m.  Estimated Nutritional Needs:   Kcal:  2400-2600  Protein:  115-130 grams  Fluid:  1000 ml + UOP    Levada Schilling, RD, LDN, CDCES Registered Dietitian III Certified Diabetes Care and Education Specialist Please refer to [facility] for RD and/or RD on-call/weekend/after hours pager

## 2023-01-24 NOTE — Progress Notes (Signed)
[facility]  ROUNDING NOTE   Subjective:   Patient seen laying in bed  Wound vac in place on right foot Pain well managed   Dialysis tomorrow  Objective:  Vital signs in last 24 hours:  Temp:  [97.1 F (36.2 C)-98.9 F (37.2 C)] 98.2 F (36.8 C) (10/02 0834) Pulse Rate:  [70-90] 70 (10/02 0834) Resp:  [11-29] 20 (10/02 0834) BP: (84-127)/(49-74) 94/54 (10/02 0834) SpO2:  [93 %-99 %] 95 % (10/02 0834) Weight:  [122.5 kg] 122.5 kg (10/01 1126)  Weight change:  Filed Weights   01/21/23 0423 01/23/23 0746 01/23/23 1126  Weight: 119 kg 124.6 kg 122.5 kg    Intake/Output: I/O last 3 completed shifts: In: 9 [I.V.:75] Out: 1915 [Other:1900; Blood:15]   Intake/Output this shift:  No intake/output data recorded.  Physical Exam: General: No acute distress  Head: Normocephalic, atraumatic. Moist oral mucosal membranes  Neck: Supple  Lungs:  Clear to auscultation, normal effort  Heart: S1S2 no rubs  Abdomen:  Soft, nontender, bowel sounds present  Extremities: No peripheral edema.RLE NPWV  Neurologic: Awake, alert, following commands  Skin: No acute rash, right foot surgical dressing  Access: Right IJ PermCath    Basic Metabolic Panel: Recent Labs  Lab 01/21/23 0451 01/22/23 0501 01/23/23 0749 01/24/23 0458  NA 140 135 135 136  K 3.8 4.7 4.6 4.6  CL 109 97* 97* 99  CO2 21* 23 22 25   GLUCOSE 91 111* 90 102*  BUN 31* 56* 77* 60*  CREATININE 7.66* 13.12* 15.15* 12.31*  CALCIUM 5.2* 6.7* 6.5* 6.6*  MG 1.6*  --   --   --   PHOS 2.6  --   --   --     Liver Function Tests: Recent Labs  Lab 01/21/23 0451 01/22/23 0501  AST 26 12*  ALT 10 14  ALKPHOS 34* 47  BILITOT 1.5* 0.9  PROT 5.6* 6.7  ALBUMIN 2.7* 3.1*   No results for input(s): "LIPASE", "AMYLASE" in the last 168 hours. No results for input(s): "AMMONIA" in the last 168 hours.  CBC: Recent Labs  Lab 01/21/23 0451 01/22/23 0501 01/23/23 0749 01/24/23 0458  WBC 8.1 5.5 5.9 4.4   NEUTROABS 6.2  --  4.0 2.8  HGB 10.2* 10.0* 9.3* 9.2*  HCT 32.4* 30.7* 27.9* 28.0*  MCV 93.4 90.6 89.4 89.2  PLT 150 137* 134* 130*    Cardiac Enzymes: No results for input(s): "CKTOTAL", "CKMB", "CKMBINDEX", "TROPONINI" in the last 168 hours.  BNP: Invalid input(s): "POCBNP"  CBG: Recent Labs  Lab 01/22/23 1536 01/22/23 2045 01/23/23 1612 01/23/23 2247 01/24/23 0830  GLUCAP 85 113* 185* 105* 85    Microbiology: Results for orders placed or performed during the hospital encounter of 01/21/23  Blood Culture (routine x 2)     Status: None (Preliminary result)   Collection Time: 01/21/23  5:08 AM   Specimen: BLOOD  Result Value Ref Range Status   Specimen Description BLOOD BLOOD RIGHT ARM  Final   Special Requests   Final    BOTTLES DRAWN AEROBIC AND ANAEROBIC Blood Culture results may not be optimal due to an inadequate volume of blood received in culture bottles   Culture   Final    NO GROWTH 3 DAYS Performed at [facility], [address], [place], [place] [address]    Report Status PENDING  Incomplete  Blood Culture (routine x 2)     Status: None (Preliminary result)   Collection Time: 01/21/23  5:31 AM  Specimen: BLOOD  Result Value Ref Range Status   Specimen Description BLOOD BLOOD LEFT ARM  Final   Special Requests   Final    BOTTLES DRAWN AEROBIC AND ANAEROBIC Blood Culture results may not be optimal due to an inadequate volume of blood received in culture bottles   Culture   Final    NO GROWTH 3 DAYS Performed at [facility], [address], [place], [place] [address]    Report Status PENDING  Incomplete  Aerobic/Anaerobic Culture w Gram Stain (surgical/deep wound)     Status: None (Preliminary result)   Collection Time: 01/23/23  1:13 PM   Specimen: Wound; Body Fluid  Result Value Ref Range Status   Specimen Description   Final    WOUND Performed at [facility], [address] [place], [place] [address]    Special  Requests   Final    RIGHT 5TH METATARSEL ULCER Performed at [facility], [address] [place], [place] [address]    Gram Stain NO WBC SEEN NO ORGANISMS SEEN   Final   Culture   Final    NO GROWTH < 12 HOURS Performed at [facility], [address], [place], [place] [address]    Report Status PENDING  Incomplete  Aerobic/Anaerobic Culture w Gram Stain (surgical/deep wound)     Status: None (Preliminary result)   Collection Time: 01/23/23  1:16 PM   Specimen: Wound  Result Value Ref Range Status   Specimen Description   Final    WOUND Performed at [facility], [address], [place], [place] [address]    Special Requests   Final    RIGHT 5TH MATATARSEL BONE Performed at [facility], [address] [place], [place] [address]    Gram Stain   Final    NO WBC SEEN NO ORGANISMS SEEN Performed at [facility], [address], [place], [place] [address]    Culture PENDING  Incomplete   Report Status PENDING  Incomplete    Coagulation Studies: No results for input(s): "LABPROT", "INR" in the last 72 hours.   Urinalysis: No results for input(s): "COLORURINE", "LABSPEC", "PHURINE", "GLUCOSEU", "HGBUR", "BILIRUBINUR", "KETONESUR", "PROTEINUR", "UROBILINOGEN", "NITRITE", "LEUKOCYTESUR" in the last 72 hours.  Invalid input(s): "APPERANCEUR"    Imaging: No results found.   Medications:    sodium chloride     sodium chloride     ceFEPime (MAXIPIME) IV 1 g (01/23/23 1805)   metronidazole 500 mg (01/24/23 0933)   vancomycin      (feeding supplement) PROSource Plus  30 mL Oral TID BM   vitamin C  500 mg Oral BID   Chlorhexidine Gluconate Cloth  6 each Topical Daily   heparin  5,000 Units Subcutaneous Q8H   insulin aspart  0-6 Units Subcutaneous TID WC   multivitamin  1 tablet Oral QHS   nutrition supplement (JUVEN)  1 packet Oral BID BM   sodium chloride flush  3 mL Intravenous Q12H   zinc sulfate  220 mg Oral Daily    sodium chloride, acetaminophen, diphenhydrAMINE, famotidine, fentaNYL (SUBLIMAZE) injection, HYDROmorphone (DILAUDID) injection, hydrOXYzine, methylPREDNISolone (SOLU-MEDROL) injection, midazolam, ondansetron (ZOFRAN) IV, oxyCODONE-acetaminophen **AND** oxyCODONE, sodium chloride flush  Assessment/ Plan:  47 y.o. male ESRD on HD TTS, CHF, history of Guillain-Barr syndrome, hypertension, anemia of chronic kidney disease, secondary hyperparathyroidism, history of right foot toe amputation who presents with ulceration on lateral aspect of right foot.  [facility]  1.  ESRD  on HD TTS.  Patient received dialysis treatment yesterday, treatment terminated 12 minutes prior to end due to surgical procedure.  Next treatment scheduled for Thursday.   2.  Anemia of chronic kidney disease.   Lab Results  Component Value Date   HGB 9.2 (L) 01/24/2023   Patient receives Mircera as outpatient.  Hemoglobin remains acceptable at 9.2.  3.  Secondary hyperparathyroidism.  Calcium 6.6. Will correct slowly with dialysis. Patient prescribed auryxia with meals outpatient   4.  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  Currently on vancomycin and metronidazole.  Imaging suspicious for osteomyelitis.  Podiatry performed debridement on 01/23/23 with placement on wound vac. Vascular to perform angiogram later today.    LOS: 3   10/2/202410:35 AM

## 2023-01-24 NOTE — Consult Note (Signed)
NAME: Patrick Downs  DOB: Sep 11, 1975  MRN: [id number]  Date/Time: 01/24/2023 5:02 PM  REQUESTING PROVIDER: Dr.Fowler Subjective:  REASON FOR CONSULT: Rt foot infection ? Patrick Downs is a 47 y.o. 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 It was caused by th brace rubbing on the skin  01/21/23  BP 108/69  Temp 98.5 F (36.9 C)  Pulse Rate 80  Resp 18  SpO2 98 %    Latest Reference Range & Units 01/21/23  WBC 4.0 - 10.5 K/uL 8.1  Hemoglobin 13.0 - 17.0 g/dL 13.2 (L)  HCT 44.0 - 10.2 % 32.4 (L)  Platelets 150 - 400 K/uL 150  Creatinine 0.61 - 1.24 mg/dL 7.25 (H)   Seen by podiatrist and underwent surgery on 10/1 and underwent excisional debridement to bone and biopsy of the 5th met Today he had angioplasty to Distal SFA rt, above knee Popliteal artery and stent placement Angioplasty of RT tibioperoneal trunk   I am asked to see patient for antibiotic management  Pt is currently on triple antibiotic vance, cefepime and flagyl  Past medical history ESRD Peripheral neuropathy Secondary hyperparathyroidism Hypotension Guillain-Barr syndrome Gout CHF MSSA bacteremia  MRSA infection history of Covid with Covid toes    Past surgical history Right and left fifth toe amputation AV fistula  Parathyroidectomy Renal biopsy Tonsillectomy Tracheotomy   Social History   Socioeconomic History   Marital status: Single    Spouse name: Not on file   Number of children: Not on file   Years of education: Not on file   Highest education level: Not on file  Occupational History   Not on file  Tobacco Use   Smoking status: Never   Smokeless tobacco: Never  Vaping Use    Vaping status: Never Used  Substance and Sexual Activity   Alcohol use: No   Drug use: Yes    Types: Marijuana   Sexual activity: Yes  Other Topics Concern   Not on file  Social History Narrative   Not on file   Social Determinants of Health   Financial Resource Strain: Low Risk  (09/11/2022)   Received from [facility], [facility]   Overall Financial Resource Strain (CARDIA)    Difficulty of Paying Living Expenses: Not very hard  Food Insecurity: No Food Insecurity (09/11/2022)   Received from [facility], [facility]   Hunger Vital Sign    Worried About Running Out of Food in the Last Year: Never true    Ran Out of Food in the Last Year: Never true  Transportation Needs: No Transportation Needs (09/11/2022)   Received from [facility], [facility]   [facility] - Transportation    In the past 12 months, has lack of transportation kept you from medical appointments or from getting medications?: No    Lack of Transportation (Non-Medical): No  Physical Activity: Not on file  Stress: No Stress Concern Present (09/11/2022)   Received from [facility], [facility]   [employer] of Occupational Health - Occupational Stress Questionnaire    Feeling of Stress : Not at all  Social Connections: Unknown (09/11/2022)   Received from [facility],  [facility]   Social Connection and Isolation Panel [NHANES]    Frequency of Communication with Friends and Family: Never    Frequency of Social Gatherings with Friends and Family: Never    Attends Religious Services: Not on Marketing executive or Organizations: Not on file    Attends Banker Meetings: Not on file    Marital Status: Not on file  Intimate Partner Violence: Not on file    Family History  Problem Relation Age of Onset   Diabetes  Mellitus II Father    Kidney disease Father    Kidney failure Paternal Grandfather    Prostate cancer Neg Hx    Kidney cancer Neg Hx    Bladder Cancer Neg Hx    Allergies  Allergen Reactions   Hepatitis B Vaccine     Other reaction(s): Unknown   Ondansetron Other (See Comments)    Stomach pain    Minoxidil Other (See Comments)    "put fluid around my heart", PERICARDIAL EFFUSION   Morphine And Codeine Other (See Comments)    Aggressive    Omnipaque [Iohexol] Itching and Other (See Comments)    Rigors on one occasion, widespread itching on a separate occasion (resolved with Benadryl), tremors   I? Current Facility-Administered Medications  Medication Dose Route Frequency Provider Last Rate Last Admin   (feeding supplement) PROSource Plus liquid 30 mL  30 mL Oral TID BM Rosezetta Schlatter T, MD   30 mL at 01/23/23 1806   0.9 %  sodium chloride infusion  250 mL Intravenous PRN Floydene Flock, MD       acetaminophen (TYLENOL) tablet 650 mg  650 mg Oral BID PRN Floydene Flock, MD   650 mg at 01/22/23 0214   ascorbic acid (VITAMIN C) tablet 500 mg  500 mg Oral BID Rosezetta Schlatter T, MD   500 mg at 01/24/23 0926   ceFEPIme (MAXIPIME) 1 g in sodium chloride 0.9 % 100 mL IVPB  1 g Intravenous Q24H Floydene Flock, MD 200 mL/hr at 01/23/23 1805 1 g at 01/23/23 1805   Chlorhexidine Gluconate Cloth 2 % PADS 6 each  6 each Topical Daily Floydene Flock, MD   6 each at 01/24/23 0928   heparin injection 5,000 Units  5,000 Units Subcutaneous Q8H Floydene Flock, MD   5,000 Units at 01/24/23 0536   hydrOXYzine (ATARAX) tablet 25 mg  25 mg Oral TID PRN Andris Baumann, MD   25 mg at 01/24/23 1540   insulin aspart (novoLOG) injection 0-6 Units  0-6 Units Subcutaneous TID WC Floydene Flock, MD       metroNIDAZOLE (FLAGYL) IVPB 500 mg  500 mg Intravenous BID Barrie Folk, RPH 100 mL/hr at 01/24/23 0933 500 mg at 01/24/23 0933   multivitamin (RENA-VIT) tablet 1 tablet  1 tablet Oral QHS Loyce Dys, MD   1 tablet at 01/23/23 2134   nutrition supplement (JUVEN) (JUVEN) powder packet 1 packet  1 packet Oral BID BM Loyce Dys, MD       oxyCODONE-acetaminophen (PERCOCET/ROXICET) 5-325 MG per tablet 1 tablet  1 tablet Oral Q4H PRN Floydene Flock, MD   1 tablet at 01/24/23 4098   And   oxyCODONE (Oxy IR/ROXICODONE) immediate release tablet 5 mg  5 mg Oral Q4H PRN Floydene Flock, MD   5 mg at 01/24/23 0927   sodium chloride 0.9 % bolus 250 mL  250 mL Intravenous Once Rolla Plate R, NP 250 mL/hr at 01/24/23 1453 Rate Change at 01/24/23 1453   sodium chloride flush (NS) 0.9 % injection 3 mL  3 mL Intravenous Q12H Floydene Flock, MD   3 mL at 01/23/23 2143   sodium chloride flush (NS) 0.9 % injection 3 mL  3 mL Intravenous PRN Floydene Flock, MD       vancomycin (VANCOCIN) IVPB 1000 mg/200 mL premix  1,000 mg Intravenous Q T,Th,Sa-HD Barrie Folk, RPH       zinc sulfate capsule 220 mg  220 mg Oral Daily Loyce Dys, MD   220 mg at 01/24/23 4098     Abtx:  Anti-infectives (From admission, onward)    Start     Dose/Rate Route Frequency Ordered Stop   01/24/23 0914  ceFAZolin (ANCEF) IVPB 2g/100 mL premix  Status:  Discontinued        2 g 200 mL/hr over 30 Minutes Intravenous 30 min pre-op 01/24/23 0914 01/24/23 0947   01/23/23 1700  metroNIDAZOLE (FLAGYL) IVPB 500 mg        500 mg 100 mL/hr over 60 Minutes Intravenous 2 times daily 01/23/23 1646 01/28/23 2159   01/23/23 1700  vancomycin (VANCOCIN) IVPB 1000 mg/200 mL premix        1,000 mg 200 mL/hr over 60 Minutes Intravenous Every T-Th-Sa (Hemodialysis) 01/23/23 1653 02/01/23 1659   01/23/23 1200  vancomycin (VANCOCIN) IVPB 1000 mg/200 mL premix  Status:  Discontinued        1,000 mg 200 mL/hr over 60 Minutes Intravenous Every T-Th-Sa (Hemodialysis) 01/21/23 1035 01/23/23 1653   01/22/23 0800  ceFEPIme (MAXIPIME) 1 g in sodium chloride 0.9 % 100 mL IVPB        1 g 200 mL/hr over 30 Minutes Intravenous Every 24  hours 01/21/23 1035 01/28/23 1759   01/21/23 1800  metroNIDAZOLE (FLAGYL) IVPB 500 mg  Status:  Discontinued        500 mg 100 mL/hr over 60 Minutes Intravenous 2 times daily 01/21/23 0851 01/23/23 1646   01/21/23 0500  vancomycin (VANCOCIN) IVPB 1000 mg/200 mL premix       Placed in "Followed by" Linked Group   1,000 mg 200 mL/hr over 60 Minutes Intravenous  Once 01/21/23 0452 01/21/23 0652   01/21/23 0500  vancomycin (VANCOREADY) IVPB 1500 mg/300 mL       Placed in "Followed by" Linked Group   1,500 mg 150 mL/hr over 120 Minutes Intravenous  Once 01/21/23 0452 01/21/23 1105   01/21/23 0445  ceFEPIme (MAXIPIME) 2 g in sodium chloride 0.9 % 100 mL IVPB        2 g 200 mL/hr over 30 Minutes Intravenous  Once 01/21/23 0439 01/21/23 0604   01/21/23 0445  metroNIDAZOLE (FLAGYL) IVPB 500 mg        500 mg 100 mL/hr over 60 Minutes Intravenous  Once 01/21/23 0439 01/21/23 0652   01/21/23 0445  vancomycin (VANCOCIN) IVPB 1000 mg/200 mL premix  Status:  Discontinued        1,000 mg 200 mL/hr over 60 Minutes Intravenous  Once 01/21/23 0439 01/21/23 0452       REVIEW OF SYSTEMS:  Const: negative fever, negative chills, negative weight loss Eyes: negative diplopia or visual changes, negative eye pain ENT: negative coryza, negative sore throat Resp: negative cough, hemoptysis, dyspnea Cards: negative for chest pain, palpitations, lower extremity edema GU: negative for frequency, dysuria and hematuria GI: Negative for abdominal pain, diarrhea,  bleeding, constipation Skin: negative for rash and pruritus Heme: negative for easy bruising and gum/nose bleeding MS: negative for myalgias, arthralgias, back pain and muscle weakness Neurolo:contractures fingers Foot drop Psych: negative for feelings of anxiety, depression  Endocrine:  diabetes Allergy/Immunology- negative for any medication or food allergies ?  Objective:  VITALS:  BP 115/72 (BP Location: Right Arm)   Pulse 80   Temp (!) 97.5  F (36.4 C)   Resp 19   Ht 6\' 2"  (1.88 m)   Wt 122.5 kg   SpO2 96%   BMI 34.67 kg/m   PHYSICAL EXAM:  General: Alert, cooperative, no distress, appears stated age.  Head: Normocephalic, without obvious abnormality, atraumatic. Eyes: Conjunctivae clear, anicteric sclerae. Pupils are equal ENT Nares normal. No drainage or sinus tenderness. Lips, mucosa, and tongue normal. No Thrush Neck: Supple, symmetrical, no adenopathy, thyroid: non tender no carotid bruit and no JVD. Lungs: Clear to auscultation bilaterally. No Wheezing or Rhonchi. No rales. Heart: Regular rate and rhythm, no murmur, rub or gallop. Abdomen: Soft, non-tender,not distended. Bowel sounds normal. No masses Extremities: rt foot wound vac Skin: multiple scar tissue RT internal jugular dialysis cath Lymph: Cervical, supraclavicular normal. Neurologic: wasting of extremities Pertinent Labs Lab Results CBC    Component Value Date/Time   WBC 4.4 01/24/2023 0458   RBC 3.14 (L) 01/24/2023 0458   HGB 9.2 (L) 01/24/2023 0458   HGB 7.7 (L) 08/22/2014 0653   HCT 28.0 (L) 01/24/2023 0458   HCT 23.9 (L) 08/22/2014 0653   PLT 130 (L) 01/24/2023 0458   PLT 195 08/22/2014 0653   MCV 89.2 01/24/2023 0458   MCV 92 08/22/2014 0653   MCH 29.3 01/24/2023 0458   MCHC 32.9 01/24/2023 0458   RDW 14.4 01/24/2023 0458   RDW 17.3 (H) 08/22/2014 0653   LYMPHSABS 0.9 01/24/2023 0458   LYMPHSABS 0.9 (L) 08/22/2014 0653   MONOABS 0.6 01/24/2023 0458   MONOABS 1.6 (H) 08/22/2014 0653   EOSABS 0.1 01/24/2023 0458   EOSABS 0.0 08/22/2014 0653   BASOSABS 0.0 01/24/2023 0458   BASOSABS 0.0 08/22/2014 0653       Latest Ref Rng & Units 01/24/2023    4:58 AM 01/23/2023    7:49 AM 01/22/2023    5:01 AM  CMP  Glucose 70 - 99 mg/dL 782  90  956   BUN 6 - 20 mg/dL 60  77  56   Creatinine 0.61 - 1.24 mg/dL 21.30  86.57  84.69   Sodium 135 - 145 mmol/L 136  135  135   Potassium 3.5 - 5.1 mmol/L 4.6  4.6  4.7   Chloride 98 - 111 mmol/L  99  97  97   CO2 22 - 32 mmol/L 25  22  23    Calcium 8.9 - 10.3 mg/dL 6.6  6.5  6.7   Total Protein 6.5 - 8.1 g/dL   6.7   Total Bilirubin 0.3 - 1.2 mg/dL   0.9   Alkaline Phos 38 - 126 U/L   47   AST 15 - 41 U/L   12   ALT 0 - 44 U/L   14       Microbiology: Recent Results (from the past 240 hour(s))  Blood Culture (routine x 2)     Status: None (Preliminary result)   Collection Time: 01/21/23  5:08 AM   Specimen: BLOOD  Result Value Ref Range Status   Specimen Description BLOOD BLOOD RIGHT ARM  Final   Special Requests  Final    BOTTLES DRAWN AEROBIC AND ANAEROBIC Blood Culture results may not be optimal due to an inadequate volume of blood received in culture bottles   Culture   Final    NO GROWTH 3 DAYS Performed at [facility], [address] [place], [place] [address]    Report Status PENDING  Incomplete  Blood Culture (routine x 2)     Status: None (Preliminary result)   Collection Time: 01/21/23  5:31 AM   Specimen: BLOOD  Result Value Ref Range Status   Specimen Description BLOOD BLOOD LEFT ARM  Final   Special Requests   Final    BOTTLES DRAWN AEROBIC AND ANAEROBIC Blood Culture results may not be optimal due to an inadequate volume of blood received in culture bottles   Culture   Final    NO GROWTH 3 DAYS Performed at [facility], [address] [place], [place] [address]    Report Status PENDING  Incomplete  Aerobic/Anaerobic Culture w Gram Stain (surgical/deep wound)     Status: None (Preliminary result)   Collection Time: 01/23/23  1:13 PM   Specimen: Wound; Body Fluid  Result Value Ref Range Status   Specimen Description   Final    WOUND Performed at [facility], [address], [place], [place] [address]    Special Requests   Final    RIGHT 5TH METATARSEL ULCER Performed at [facility], [address] [place], [place] [address]    Gram Stain NO WBC SEEN NO ORGANISMS SEEN   Final   Culture   Final     NO GROWTH < 12 HOURS Performed at [facility], [address], [place], [place] [address]    Report Status PENDING  Incomplete  Aerobic/Anaerobic Culture w Gram Stain (surgical/deep wound)     Status: None (Preliminary result)   Collection Time: 01/23/23  1:16 PM   Specimen: Wound  Result Value Ref Range Status   Specimen Description   Final    WOUND Performed at [facility], [address], [place], [place] [address]    Special Requests   Final    RIGHT 5TH MATATARSEL BONE Performed at [facility], [address] [place], [place] [address]    Gram Stain   Final    NO WBC SEEN NO ORGANISMS SEEN Performed at [facility], [address], [place], [place] [address]    Culture PENDING  Incomplete   Report Status PENDING  Incomplete    IMAGING RESULTS: MRI Rt foot Soft tissue ulceration lateral to the base of the 5th metatarsal with underlying marrow changes in the base of the 5th metatarsal suspicious for osteomyelitis. I have personally reviewed the films ? Impression/Recommendation ?DM with DFI Rt gangrenous ulcer s/p debridement and  bone culture sent Pt currently on triple antibiotic- will de-escalate soon. Depending on the pathology result would decide on route , and duration of antibiotic  PAD s/p stent to Rt popliteal  ESRD on dialysis Multiple failed AVF Now has permacath  H/o GBS with neurological deficit ? ? ___________________________________________________ Discussed with patient Note:  This document was prepared using Dragon voice recognition software and may include unintentional dictation errors.

## 2023-01-24 NOTE — Anesthesia Postprocedure Evaluation (Signed)
Anesthesia Post Note  Patient: Patrick Downs  Procedure(s) Performed: IRRIGATION AND DEBRIDEMENT FOOT (Right)  Patient location during evaluation: Endoscopy Anesthesia Type: General Level of consciousness: awake and alert Pain management: pain level controlled Vital Signs Assessment: post-procedure vital signs reviewed and stable Respiratory status: spontaneous breathing, nonlabored ventilation and respiratory function stable Cardiovascular status: blood pressure returned to baseline and stable Postop Assessment: no apparent nausea or vomiting Anesthetic complications: no   No notable events documented.   Last Vitals:  Vitals:   01/24/23 0432 01/24/23 0834  BP: 103/66 (!) 94/54  Pulse: 79 70  Resp:  20  Temp: 37.2 C 36.8 C  SpO2: 98% 95%    Last Pain:  Vitals:   01/24/23 0432  TempSrc: Oral  PainSc:                  Foye Deer

## 2023-01-24 NOTE — Progress Notes (Signed)
PODIATRY / FOOT AND ANKLE SURGERY PROGRESS NOTE   HPI: Patrick Downs is a 47 y.o. male who presents with resting in bed comfortably status post 1 day after going I&D with debridement of wound, bone biopsies, antibiotic bead application, and wound VAC.  Patient notes no pain to the right foot at this time and has kept off his foot since the procedure.  Patient denies nausea, vomiting, fevers, chills.  PMHx:  Past Medical History:  Diagnosis Date   CHF (congestive heart failure) (HCC)    Degenerative disc disease, lumbar    Depression    Dialysis patient (HCC)    ESRD (end stage renal disease) on dialysis (HCC)    Failure to thrive in adult    GERD (gastroesophageal reflux disease)    Gout    Guillain Barr syndrome (HCC)    Guillain Barr syndrome (HCC)    Heart murmur    Hip pain, chronic, left 12/24/2020   HTN (hypertension)    Hyperparathyroidism (HCC)    Kidney failure    MRSA (methicillin resistant staph aureus) culture positive    Peripheral neuropathy    Pneumonia    Renal insufficiency    Respiratory failure (HCC)     Surgical Hx:  Past Surgical History:  Procedure Laterality Date   A/V FISTULAGRAM Right 01/07/2018   Procedure: A/V FISTULAGRAM;  Surgeon: Annice Needy, MD;  Location: [facility];  Service: Cardiovascular;  Laterality: Right;   AMPUTATION TOE Right 06/08/2017   Procedure: AMPUTATION TOE RIGHT FIFTH TOE;  Surgeon: Gwyneth Revels, DPM;  Location: [facility];  Service: Podiatry;  Laterality: Right;   AMPUTATION TOE Left 05/17/2018   Procedure: RAY LEFT 5TH;  Surgeon: Gwyneth Revels, DPM;  Location: [facility];  Service: Podiatry;  Laterality: Left;   AMPUTATION TOE Right 03/05/2020   Procedure: AMPUTATION TOE IPJ X 2 RIGHT GREAT AND 3RD;  Surgeon: Gwyneth Revels, DPM;  Location: [facility];  Service: Podiatry;  Laterality: Right;   AV FISTULA PLACEMENT     x5      2 graphs   AV FISTULA PLACEMENT Right 04/08/2020   Procedure: INSERTION OF ARTERIOVENOUS  (AV) GORE-TEX GRAFT ARM ( ARTEGRAFT);  Surgeon: Annice Needy, MD;  Location: [facility];  Service: Vascular;  Laterality: Right;   DIALYSIS/PERMA CATHETER INSERTION N/A 01/20/2019   Procedure: DIALYSIS/PERMA CATHETER INSERTION;  Surgeon: Annice Needy, MD;  Location: [facility];  Service: Cardiovascular;  Laterality: N/A;   DIALYSIS/PERMA CATHETER REMOVAL N/A 06/17/2018   Procedure: DIALYSIS/PERMA CATHETER REMOVAL;  Surgeon: Annice Needy, MD;  Location: [facility];  Service: Cardiovascular;  Laterality: N/A;   IRRIGATION AND DEBRIDEMENT FOOT Right 01/23/2023   Procedure: IRRIGATION AND DEBRIDEMENT FOOT;  Surgeon: Gwyneth Revels, DPM;  Location: [facility];  Service: Orthopedics/Podiatry;  Laterality: Right;   PARATHYROIDECTOMY     PERIPHERAL VASCULAR THROMBECTOMY Right 01/22/2019   Procedure: PERIPHERAL VASCULAR THROMBECTOMY;  Surgeon: Annice Needy, MD;  Location: [facility];  Service: Cardiovascular;  Laterality: Right;   REMOVAL OF GRAFT Right 05/16/2020   Procedure: REMOVAL OF GRAFT;  Surgeon: Fransisco Hertz, MD;  Location: [facility];  Service: Vascular;  Laterality: Right;   RENAL BIOPSY     REVISON OF ARTERIOVENOUS FISTULA Right 02/07/2018   Procedure: REVISON OF ARTERIOVENOUS FISTULA;  Surgeon: Annice Needy, MD;  Location: [facility];  Service: Vascular;  Laterality: Right;   TEE WITHOUT CARDIOVERSION N/A 01/22/2018   Procedure: TRANSESOPHAGEAL ECHOCARDIOGRAM (TEE);  Surgeon: Laurier Nancy, MD;  Location: [facility];  Service: Cardiovascular;  Laterality: N/A;   tonsiilectomy     TONSILLECTOMY     tracheotomy     UPPER EXTREMITY VENOGRAPHY Bilateral 02/23/2020   Procedure: UPPER EXTREMITY VENOGRAPHY;  Surgeon: Annice Needy, MD;  Location: [facility];  Service: Cardiovascular;  Laterality: Bilateral;    FHx:  Family History  Problem Relation Age of Onset   Diabetes Mellitus II Father    Kidney disease Father    Kidney failure Paternal Grandfather    Prostate  cancer Neg Hx    Kidney cancer Neg Hx    Bladder Cancer Neg Hx     Social History:  reports that he has never smoked. He has never used smokeless tobacco. He reports current drug use. Drug: Marijuana. He reports that he does not drink alcohol.  Allergies:  Allergies  Allergen Reactions   Hepatitis B Vaccine     Other reaction(s): Unknown   Ondansetron Other (See Comments)    Stomach pain    Minoxidil Other (See Comments)    "put fluid around my heart", PERICARDIAL EFFUSION   Morphine And Codeine Other (See Comments)    Aggressive    Omnipaque [Iohexol] Itching and Other (See Comments)    Rigors on one occasion, widespread itching on a separate occasion (resolved with Benadryl), tremors   Medications Prior to Admission  Medication Sig Dispense Refill   acetaminophen (TYLENOL) 500 MG tablet Take 1,000-1,500 mg by mouth 2 (two) times daily as needed for moderate pain or headache.     Alpha-Lipoic Acid 600 MG TABS Take 1 tablet by mouth daily.     hydrOXYzine (ATARAX) 25 MG tablet Take 1 tablet by mouth 2 (two) times daily.     [EXPIRED] oxyCODONE-acetaminophen (PERCOCET) 10-325 MG tablet Take 1 tablet by mouth every 4 (four) hours as needed for pain. 135 tablet 0   pregabalin (LYRICA) 25 MG capsule Take by mouth.     sildenafil (VIAGRA) 100 MG tablet Take 100 mg by mouth daily as needed for erectile dysfunction.     AURYXIA 1 GM 210 MG(Fe) tablet Take 420 mg by mouth 3 (three) times daily. (Patient not taking: Reported on 01/21/2023)     calcium acetate (PHOSLO) 667 MG capsule Take 2,001 mg by mouth 3 (three) times daily.     oxyCODONE-acetaminophen (PERCOCET) 10-325 MG tablet Take 1 tablet by mouth every 4 (four) hours as needed for pain. 135 tablet 0    Physical Exam: General: Alert and oriented.  No apparent distress.  Dressing intact to the right lower extremity, no leakage or strikethrough present, wound VAC seal appears to be intact.  Some mild clear and bloody discharge  present in the wound VAC machine consistent with postoperative course.   Results for orders placed or performed during the hospital encounter of 01/21/23 (from the past 48 hour(s))  Glucose, capillary     Status: None   Collection Time: 01/22/23  3:36 PM  Result Value Ref Range   Glucose-Capillary 85 70 - 99 mg/dL    Comment: Glucose reference range applies only to samples taken after fasting for at least 8 hours.  Glucose, capillary     Status: Abnormal   Collection Time: 01/22/23  8:45 PM  Result Value Ref Range   Glucose-Capillary 113 (H) 70 - 99 mg/dL    Comment: Glucose reference range applies only to samples taken after fasting for at least 8 hours.  Hepatitis B surface  antigen     Status: None   Collection Time: 01/23/23  7:49 AM  Result Value Ref Range   Hepatitis B Surface Ag NON REACTIVE NON REACTIVE    Comment: Performed at [facility], [address], [place], [place] [address]  Hepatitis B surface antibody,quantitative     Status: None   Collection Time: 01/23/23  7:49 AM  Result Value Ref Range   Hep B S AB Quant (Post) 216.0 Immunity>10 mIU/mL    Comment: (NOTE)  Status of Immunity                     Anti-HBs Level  ------------------                     -------------- Inconsistent with Immunity                  0.0 - 10.0 Consistent with Immunity                         >10.0 Performed At: [facility] [address] [place], [place] [id number] Jolene Schimke MD VO:[phone]   CBC with Differential/Platelet     Status: Abnormal   Collection Time: 01/23/23  7:49 AM  Result Value Ref Range   WBC 5.9 4.0 - 10.5 K/uL   RBC 3.12 (L) 4.22 - 5.81 MIL/uL   Hemoglobin 9.3 (L) 13.0 - 17.0 g/dL   HCT 42.5 (L) 95.6 - 38.7 %   MCV 89.4 80.0 - 100.0 fL   MCH 29.8 26.0 - 34.0 pg   MCHC 33.3 30.0 - 36.0 g/dL   RDW 56.4 33.2 - 95.1 %   Platelets 134 (L) 150 - 400 K/uL   nRBC 0.0 0.0 - 0.2 %   Neutrophils Relative % 69 %   Neutro Abs 4.0 1.7 - 7.7 K/uL    Lymphocytes Relative 18 %   Lymphs Abs 1.1 0.7 - 4.0 K/uL   Monocytes Relative 11 %   Monocytes Absolute 0.7 0.1 - 1.0 K/uL   Eosinophils Relative 2 %   Eosinophils Absolute 0.1 0.0 - 0.5 K/uL   Basophils Relative 0 %   Basophils Absolute 0.0 0.0 - 0.1 K/uL   Immature Granulocytes 0 %   Abs Immature Granulocytes 0.02 0.00 - 0.07 K/uL    Comment: Performed at [facility], [address] [place], [place] [address]  Basic metabolic panel     Status: Abnormal   Collection Time: 01/23/23  7:49 AM  Result Value Ref Range   Sodium 135 135 - 145 mmol/L   Potassium 4.6 3.5 - 5.1 mmol/L   Chloride 97 (L) 98 - 111 mmol/L   CO2 22 22 - 32 mmol/L   Glucose, Bld 90 70 - 99 mg/dL    Comment: Glucose reference range applies only to samples taken after fasting for at least 8 hours.   BUN 77 (H) 6 - 20 mg/dL   Creatinine, Ser 60.63 (H) 0.61 - 1.24 mg/dL   Calcium 6.5 (L) 8.9 - 10.3 mg/dL   GFR, Estimated 4 (L) >60 mL/min    Comment: (NOTE) Calculated using the CKD-EPI Creatinine Equation (2021)    Anion gap 16 (H) 5 - 15    Comment: Performed at [facility], [address] [place], [place] [address]  Aerobic/Anaerobic Culture w Gram Stain (surgical/deep wound)     Status: None (Preliminary result)   Collection Time: 01/23/23  1:13  PM   Specimen: Wound; Body Fluid  Result Value Ref Range   Specimen Description      WOUND Performed at [facility], [address] [place], [place] [address]    Special Requests      RIGHT 5TH METATARSEL ULCER Performed at [facility], [address] [place], [place] [address]    Gram Stain NO WBC SEEN NO ORGANISMS SEEN     Culture      NO GROWTH < 12 HOURS Performed at [facility], [address] [place], [place] [address]    Report Status PENDING   Aerobic/Anaerobic Culture w Gram Stain (surgical/deep wound)     Status: None (Preliminary result)   Collection Time: 01/23/23  1:16 PM   Specimen: Wound   Result Value Ref Range   Specimen Description      WOUND Performed at [facility], [address], [place], [place] [address]    Special Requests      RIGHT 5TH MATATARSEL BONE Performed at [facility], [address] [place], [place] [address]    Gram Stain      NO WBC SEEN NO ORGANISMS SEEN Performed at [facility], [address], [place], [place] [address]    Culture PENDING    Report Status PENDING   Glucose, capillary     Status: Abnormal   Collection Time: 01/23/23  4:12 PM  Result Value Ref Range   Glucose-Capillary 185 (H) 70 - 99 mg/dL    Comment: Glucose reference range applies only to samples taken after fasting for at least 8 hours.  Glucose, capillary     Status: Abnormal   Collection Time: 01/23/23 10:47 PM  Result Value Ref Range   Glucose-Capillary 105 (H) 70 - 99 mg/dL    Comment: Glucose reference range applies only to samples taken after fasting for at least 8 hours.  CBC with Differential/Platelet     Status: Abnormal   Collection Time: 01/24/23  4:58 AM  Result Value Ref Range   WBC 4.4 4.0 - 10.5 K/uL   RBC 3.14 (L) 4.22 - 5.81 MIL/uL   Hemoglobin 9.2 (L) 13.0 - 17.0 g/dL   HCT 57.8 (L) 46.9 - 62.9 %   MCV 89.2 80.0 - 100.0 fL   MCH 29.3 26.0 - 34.0 pg   MCHC 32.9 30.0 - 36.0 g/dL   RDW 52.8 41.3 - 24.4 %   Platelets 130 (L) 150 - 400 K/uL   nRBC 0.0 0.0 - 0.2 %   Neutrophils Relative % 63 %   Neutro Abs 2.8 1.7 - 7.7 K/uL   Lymphocytes Relative 20 %   Lymphs Abs 0.9 0.7 - 4.0 K/uL   Monocytes Relative 13 %   Monocytes Absolute 0.6 0.1 - 1.0 K/uL   Eosinophils Relative 3 %   Eosinophils Absolute 0.1 0.0 - 0.5 K/uL   Basophils Relative 1 %   Basophils Absolute 0.0 0.0 - 0.1 K/uL   Immature Granulocytes 0 %   Abs Immature Granulocytes 0.01 0.00 - 0.07 K/uL    Comment: Performed at [facility], [address] [place], [place] [address]  Basic metabolic panel     Status: Abnormal   Collection Time:  01/24/23  4:58 AM  Result Value Ref Range   Sodium 136 135 - 145 mmol/L   Potassium 4.6 3.5 - 5.1 mmol/L   Chloride 99 98 - 111 mmol/L   CO2 25 22 - 32 mmol/L  Glucose, Bld 102 (H) 70 - 99 mg/dL    Comment: Glucose reference range applies only to samples taken after fasting for at least 8 hours.   BUN 60 (H) 6 - 20 mg/dL   Creatinine, Ser 27.06 (H) 0.61 - 1.24 mg/dL   Calcium 6.6 (L) 8.9 - 10.3 mg/dL   GFR, Estimated 5 (L) >60 mL/min    Comment: (NOTE) Calculated using the CKD-EPI Creatinine Equation (2021)    Anion gap 12 5 - 15    Comment: Performed at [facility], [address] [place], [place] [address]  Glucose, capillary     Status: None   Collection Time: 01/24/23  8:30 AM  Result Value Ref Range   Glucose-Capillary 85 70 - 99 mg/dL    Comment: Glucose reference range applies only to samples taken after fasting for at least 8 hours.  Glucose, capillary     Status: None   Collection Time: 01/24/23 11:18 AM  Result Value Ref Range   Glucose-Capillary 93 70 - 99 mg/dL    Comment: Glucose reference range applies only to samples taken after fasting for at least 8 hours.   No results found.  Blood pressure (!) 94/54, pulse 70, temperature 98.2 F (36.8 C), resp. rate 20, height 6\' 2"  (1.88 m), weight 122.5 kg, SpO2 95%.  Assessment Osteomyelitis right fifth metatarsal base with associated diabetic foot ulceration with wet gangrene status post debridement, antibiotic bead application, bone biopsies, and wound VAC application Diabetes type 2 polyneuropathy PVD  Plan -Patient seen and examined. -Cultures so far with no growth to date of metatarsal bone or wound.  Appreciate medicine recommendations for antibiotics.  If cultures do come back positive within the bone would likely recommend IV antibiotics for prolonged period of time. -Wound VAC appears to be intact today with good seal with dressing intact.  Will consider removing VAC tomorrow to evaluate wound but  will have to have wound care reapply. -Would recommend patient remain nonweightbearing to the right lower extremity due to the wound present. -Appreciate vascular recommendations, plan for CT angiogram today.  Rosetta Posner, DPM 01/24/2023, 12:52 PM

## 2023-01-24 NOTE — Plan of Care (Signed)
  Problem: Fluid Volume: Goal: Ability to maintain a balanced intake and output will improve Outcome: Progressing   Problem: Health Behavior/Discharge Planning: Goal: Ability to identify and utilize available resources and services will improve Outcome: Progressing   Problem: Metabolic: Goal: Ability to maintain appropriate glucose levels will improve Outcome: Progressing   Problem: Nutritional: Goal: Maintenance of adequate nutrition will improve Outcome: Progressing   Problem: Skin Integrity: Goal: Risk for impaired skin integrity will decrease Outcome: Progressing   Problem: Tissue Perfusion: Goal: Adequacy of tissue perfusion will improve Outcome: Progressing   Problem: Education: Goal: Knowledge of General Education information will improve Description: Including pain rating scale, medication(s)/side effects and non-pharmacologic comfort measures Outcome: Progressing   Problem: Clinical Measurements: Goal: Will remain free from infection Outcome: Progressing Goal: Diagnostic test results will improve Outcome: Progressing   Problem: Activity: Goal: Risk for activity intolerance will decrease Outcome: Progressing   Problem: Coping: Goal: Level of anxiety will decrease Outcome: Progressing   Problem: Elimination: Goal: Will not experience complications related to bowel motility Outcome: Progressing   Problem: Pain Managment: Goal: General experience of comfort will improve Outcome: Progressing

## 2023-01-24 NOTE — TOC Progression Note (Addendum)
Transition of Care [facility]) - Progression Note    Patient Details  Name: Patrick Downs MRN: [id number] Date of Birth: 10-12-1975  Transition of Care [facility]) CM/SW Contact  Allena Katz, LCSW Phone Number: 01/24/2023, 10:22 AM  Clinical Narrative:    MD reports pt will need wound vac at discharge message sent to [facility] with [employer] to get process started.   CSW informed [facility] that patient would need a wound vac and they report they can still accept referral. CSW gave tracy with [facility] email address to send to Dr. Ether Griffins.    Expected Discharge Plan: Home w Home Health Services Barriers to Discharge: Continued Medical Work up  Expected Discharge Plan and Services                                   HH Arranged: RN           Social Determinants of Health (SDOH) Interventions SDOH Screenings   Food Insecurity: No Food Insecurity (09/11/2022)   Received from [facility], [facility]  Transportation Needs: No Transportation Needs (09/11/2022)   Received from [facility], [facility]  Depression [phone]): Low Risk  (06/27/2021)  Financial Resource Strain: Low Risk  (09/11/2022)   Received from [facility], [facility]  Social Connections: Unknown (09/11/2022)   Received from [facility], [facility]  Stress: No Stress Concern Present (09/11/2022)   Received from [facility], [facility]  Tobacco Use: Low Risk  (12/22/2022)    Readmission Risk Interventions    01/22/2023   10:18 AM  Readmission Risk Prevention Plan  Transportation Screening Complete  PCP or Specialist Appt within 3-5 Days Complete  HRI or Home Care Consult Complete  Social Work Consult for Recovery Care Planning/Counseling Patient refused  Palliative Care Screening Not Applicable  Medication Review [employer]) Complete

## 2023-01-24 NOTE — Progress Notes (Signed)
Progress Note   Patient: Patrick Downs:[id number] DOB: Feb 07, 1976 DOA: 01/21/2023     3 DOS: the patient was seen and examined on 01/24/2023      Subjective:  Patient seen awake resting in bed this AM. Reports being hungry as he is NPO for vascular procedure today. No other acute complaints.      Brief hospital course: From HPI "MAURIZIO BRANDENBERGER is a 47 y.o. male with medical history significant of ESRD on hemodialysis Tuesday Thursday Saturday, GERD, hypertension, gout, type 2 diabetes, peripheral neuropathy, prior toe amputation presenting with right lower extremity cellulitis.  Patient reports ulcer formation on the right lateral foot.  Has had worsening drainage over multiple days.  Mom malaise.  Patient does wear a brace on the right foot.  Has been unable to wear brace secondary to swelling.  Noted malodorous drainage from wound.  No fevers or chills.  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  Non-smoker.  Patient denies any alcohol use.  Has been compliant with hemodialysis regimen. Presented to the ER Tmax 99.4, blood pressures 90s to 110s over 50s to 70s.  Satting well on room air.  White count 8.1, hemoglobin 10.2, platelets 150.  Creatinine 7.7, T. bili 1.5.  Right foot plain films with superficial soft tissue wound with subcutaneous gas adjacent to the base of the fifth metatarsal bone.  "   Assessment and Plan:   Cellulitis of the right fourth toe with underlying osteomyelitis Right lateral foot cellulitis in setting of end-stage renal disease with prior amputations in the past MRI of the right foot showing soft tissue ulceration lateral to the base of the fifth metatarsal with underlying marrow changes at the base of the fifth metatarsal suspicious for osteomyelitis.  No abscess detected Podiatry on board and patient was taken for bone biopsy and debridement today --Continue broad spectrum abx (Vanc/Cefepime/Flagyl) --Follow cultures & tailor antibiotics  accordingly --Wound care on board we appreciate input     Type 2 diabetes mellitus with peripheral neuropathy (HCC) --Continue insulin & adjust regimen for inpatient goal 140-180 --Continue monitoring glucose closely   ESRD on dialysis (HCC) Baseline ESRD on HD TTS  No reported missed episodes of HD  --Nephrology following for dialysis --Monitor renal function panels     Polyneuropathy, unspecified Continue Lyrica   GERD (gastroesophageal reflux disease) PPI      Advance Care Planning:   Code Status: Full Code    Consults: nephrology, podiatry, vascular surgery   Family Communication: None at bedside on rounds        Physical Exam: General exam: awake, alert, no acute distress HEENT: moist mucus membranes, hearing grossly normal  Respiratory system: CTAB, no wheezes, rales or rhonchi, normal respiratory effort. Cardiovascular system: normal S1/S2, RRR Gastrointestinal system: soft, NT, ND, no HSM felt, +bowel sounds. Central nervous system: A&O x 3. no gross focal neurologic deficits, normal speech Extremities: right foot wound vac in place, no edema, normal tone Skin: dry, intact, normal temperature Psychiatry: normal mood, congruent affect, judgement and insight appear normal       Data Reviewed: I have reviewed patient's lab results as shown below as well as vascular surgeon and podiatrist documentation    Latest Ref Rng & Units 01/24/2023    4:58 AM 01/23/2023    7:49 AM 01/22/2023    5:01 AM  CBC  WBC 4.0 - 10.5 K/uL 4.4  5.9  5.5   Hemoglobin 13.0 - 17.0 g/dL 9.2  9.3  10.0   Hematocrit 39.0 - 52.0 % 28.0  27.9  30.7   Platelets 150 - 400 K/uL 130  134  137        Latest Ref Rng & Units 01/24/2023    4:58 AM 01/23/2023    7:49 AM 01/22/2023    5:01 AM  BMP  Glucose 70 - 99 mg/dL 409  90  811   BUN 6 - 20 mg/dL 60  77  56   Creatinine 0.61 - 1.24 mg/dL 91.47  82.95  62.13   Sodium 135 - 145 mmol/L 136  135  135   Potassium 3.5 - 5.1 mmol/L 4.6  4.6   4.7   Chloride 98 - 111 mmol/L 99  97  97   CO2 22 - 32 mmol/L 25  22  23    Calcium 8.9 - 10.3 mg/dL 6.6  6.5  6.7      Vitals:   01/24/23 1406 01/24/23 1407 01/24/23 1411 01/24/23 1416  BP: (!) 113/57  (!) 104/39 114/72  Pulse: 71  76 (!) 0  Resp: 10  10   Temp:      TempSrc:      SpO2: 98% 100% 100% 100%  Weight:      Height:        Author: Pennie Banter, DO 01/24/2023 2:27 PM  For on call review [email or web address].

## 2023-01-24 NOTE — Care Management Important Message (Signed)
Important Message  Patient Details  Name: Patrick Downs MRN: [id number] Date of Birth: Dec 30, 1975   Important Message Given:  Yes - Medicare IM     Johnell Comings 01/24/2023, 10:22 AM

## 2023-01-24 NOTE — TOC Progression Note (Signed)
Transition of Care [facility]) - Progression Note    Patient Details  Name: CRISTINA MATTERN MRN: [id number] Date of Birth: Aug 04, 1975  Transition of [facility]) CM/SW Contact  Allena Katz, LCSW Phone Number: 01/24/2023, 10:14 AM  Clinical Narrative:   Pt had an I&D of R foot yesterday with placement of a wound vac. Bone biopsy completed pending cultures. TOC following.   Expected Discharge Plan: Home w Home Health Services Barriers to Discharge: Continued Medical Work up  Expected Discharge Plan and Services                                   HH Arranged: RN           Social Determinants of Health (SDOH) Interventions SDOH Screenings   Food Insecurity: No Food Insecurity (09/11/2022)   Received from [facility], [facility]  Transportation Needs: No Transportation Needs (09/11/2022)   Received from [facility], [facility]  Depression [phone]): Low Risk  (06/27/2021)  Financial Resource Strain: Low Risk  (09/11/2022)   Received from [facility], [facility]  Social Connections: Unknown (09/11/2022)   Received from [facility], [facility]  Stress: No Stress Concern Present (09/11/2022)   Received from [facility], [facility]  Tobacco Use: Low Risk  (12/22/2022)    Readmission Risk Interventions    01/22/2023   10:18 AM  Readmission Risk Prevention Plan  Transportation Screening Complete  PCP or Specialist Appt within 3-5 Days Complete  HRI or Home Care Consult Complete  Social Work Consult for Recovery Care Planning/Counseling Patient refused  Palliative Care Screening Not Applicable  Medication Review Oceanographer) Complete

## 2023-01-24 NOTE — Op Note (Signed)
[facility]  Percutaneous Study/Intervention Procedural Note   Date of Surgery: 01/24/2023  Surgeon(s):Elice Crigger    Assistants:none  Pre-operative Diagnosis: PAD with ulceration RLE  Post-operative diagnosis:  Same  Procedure(s) Performed:             1.  Ultrasound guidance for vascular access left femoral artery             2.  Catheter placement into right femoral artery from left femoral approach             3.  Aortogram and selective right lower extremity angiogram             4.  Percutaneous transluminal angioplasty of right tibioperoneal trunk and proximal posterior tibial artery with 4 mm diameter by 10 cm length Lutonix drug-coated angioplasty balloon             5.  Percutaneous transluminal angioplasty of the distal SFA and above-knee popliteal artery with 6 mm diameter by 22 cm length Lutonix drug-coated angioplasty balloon  6.  Placement of the right distal SFA and above-knee popliteal artery with 7 mm diameter by 20 cm length life stent             7.  StarClose closure device left femoral artery  EBL: 10 cc  Contrast: 65 cc  Fluoro Time: 4.5 minutes  Moderate Conscious Sedation Time: approximately 47 minutes using 2 mg of Versed and 75 mcg of Fentanyl              Indications:  Patient is a 47 y.o.male with nonhealing ulceration on the right foot and clinical signs of peripheral arterial disease. The patient is brought in for angiography for further evaluation and potential treatment.  Due to the limb threatening nature of the situation, angiogram was performed for attempted limb salvage. The patient is aware that if the procedure fails, amputation would be expected.  The patient also understands that even with successful revascularization, amputation may still be required due to the severity of the situation.  Risks and benefits are discussed and informed consent is obtained.   Procedure:  The patient was identified and appropriate procedural  time out was performed.  The patient was then placed supine on the table and prepped and draped in the usual sterile fashion. Moderate conscious sedation was administered during a face to face encounter with the patient throughout the procedure with my supervision of the RN administering medicines and monitoring the patient's vital signs, pulse oximetry, telemetry and mental status throughout from the start of the procedure until the patient was taken to the recovery room. Ultrasound was used to evaluate the left common femoral artery.  It was patent .  A digital ultrasound image was acquired.  A Seldinger needle was used to access the left common femoral artery under direct ultrasound guidance and a permanent image was performed.  A 0.035 J wire was advanced without resistance and a 5Fr sheath was placed.  Pigtail catheter was placed into the aorta and an AP aortogram was performed. This demonstrated minimal flow in the renal arteries and normal aorta and iliac segments without significant stenosis. I then crossed the aortic bifurcation and advanced to the right femoral head. Selective right lower extremity angiogram was then performed. This demonstrated mild disease in the proximal and mid right SFA in the 30 to 40% range.  In the distal SFA was a 60 to 70% stenosis tracking down at Hunter's canal and in the popliteal  artery just above the knee had another area of about 60% stenosis.  There was a normal tibial trifurcation with three-vessel runoff distally, but there was about a 60 to 70% stenosis at the the proximal tibioperoneal trunk and then another stenosis in the 70 to 80% range in the proximal posterior tibial artery.  His vessels were very large throughout. It was felt that it was in the patient's best interest to proceed with intervention after these images to avoid a second procedure and a larger amount of contrast and fluoroscopy based off of the findings from the initial angiogram. The patient was  systemically heparinized and a 6 [place] Ansell sheath was then placed over the [employer] wire. I then used a Kumpe catheter and the advantage wire to navigate through the SFA and popliteal lesions and then down into the tibioperoneal trunk.  A Nava cross catheter and the advantage wire were used to cross the stenosis in the tibioperoneal trunk and proximal posterior tibial artery and parked the wire in the distal posterior tibial artery.  A 4 mm diameter by 10 cm length Lutonix drug-coated angioplasty balloon was used to treat the tibioperoneal trunk and proximal posterior tibial artery.  The balloon was inflated to 8 atm for 1 minute.  Completion imaging showed marked improvement with only about a 25 to 30% residual stenosis in the tibioperoneal trunk and about a 20% residual stenosis in the posterior tibial artery.  There remained flow in the peroneal artery and the anterior tibial arteries as well.  I then turned my attention to the SFA and popliteal lesions.  A 6 mm diameter by 22 cm length Lutonix drug-coated angioplasty balloon was inflated to 6 atm for 1 minute.  This encompassed both lesions.  Completion imaging showed suboptimal result in both locations with greater than 50% residual stenosis and I elected to stent the arteries.  A 7 mm diameter by 20 cm length life stent was deployed from the distal SFA on the right down to the mid popliteal artery and postdilated with a 6 mm diameter Lutonix drug-coated balloon with excellent angiographic completion result and less than 10% residual stenosis. I elected to terminate the procedure. The sheath was removed and StarClose closure device was deployed in the left femoral artery with excellent hemostatic result. The patient was taken to the recovery room in stable condition having tolerated the procedure well.  Findings:               Aortogram:  This demonstrated minimal flow in the renal arteries and normal aorta and iliac segments without significant  stenosis.             Right Lower Extremity:  This demonstrated mild disease in the proximal and mid right SFA in the 30 to 40% range.  In the distal SFA was a 60 to 70% stenosis tracking down at Hunter's canal and in the popliteal artery just above the knee had another area of about 60% stenosis.  There was a normal tibial trifurcation with three-vessel runoff distally, but there was about a 60 to 70% stenosis at the the proximal tibioperoneal trunk and then another stenosis in the 70 to 80% range in the proximal posterior tibial artery.  His vessels were very large throughout.   Disposition: Patient was taken to the recovery room in stable condition having tolerated the procedure well.  Complications: None  [name] 01/24/2023 2:17 PM   This note was created with Dragon Medical transcription system. Any errors in  dictation are purely unintentional.

## 2023-01-24 NOTE — Interval H&P Note (Signed)
History and Physical Interval Note:  01/24/2023 2:12 PM  Patrick Downs  has presented today for surgery, with the diagnosis of PAD.  The various methods of treatment have been discussed with the patient and family. After consideration of risks, benefits and other options for treatment, the patient has consented to  Procedure(s): Lower Extremity Angiography (Right) as a surgical intervention.  The patient's history has been reviewed, patient examined, no change in status, stable for surgery.  I have reviewed the patient's chart and labs.  Questions were answered to the patient's satisfaction.     [name]

## 2023-01-25 ENCOUNTER — Encounter: Payer: Self-pay | Admitting: Vascular Surgery

## 2023-01-25 DIAGNOSIS — Z992 Dependence on renal dialysis: Secondary | ICD-10-CM | POA: Diagnosis not present

## 2023-01-25 DIAGNOSIS — G629 Polyneuropathy, unspecified: Secondary | ICD-10-CM | POA: Diagnosis not present

## 2023-01-25 DIAGNOSIS — E11628 Type 2 diabetes mellitus with other skin complications: Secondary | ICD-10-CM | POA: Diagnosis not present

## 2023-01-25 DIAGNOSIS — L03115 Cellulitis of right lower limb: Secondary | ICD-10-CM | POA: Diagnosis not present

## 2023-01-25 DIAGNOSIS — E1142 Type 2 diabetes mellitus with diabetic polyneuropathy: Secondary | ICD-10-CM | POA: Diagnosis not present

## 2023-01-25 DIAGNOSIS — L089 Local infection of the skin and subcutaneous tissue, unspecified: Secondary | ICD-10-CM | POA: Diagnosis not present

## 2023-01-25 DIAGNOSIS — L97519 Non-pressure chronic ulcer of other part of right foot with unspecified severity: Secondary | ICD-10-CM | POA: Diagnosis not present

## 2023-01-25 DIAGNOSIS — N186 End stage renal disease: Secondary | ICD-10-CM | POA: Diagnosis not present

## 2023-01-25 LAB — BASIC METABOLIC PANEL
Anion gap: 16 — ABNORMAL HIGH (ref 5–15)
BUN: 84 mg/dL — ABNORMAL HIGH (ref 6–20)
CO2: 20 mmol/L — ABNORMAL LOW (ref 22–32)
Calcium: 6.9 mg/dL — ABNORMAL LOW (ref 8.9–10.3)
Chloride: 101 mmol/L (ref 98–111)
Creatinine, Ser: 14.63 mg/dL — ABNORMAL HIGH (ref 0.61–1.24)
GFR, Estimated: 4 mL/min — ABNORMAL LOW (ref >60)
Glucose, Bld: 209 mg/dL — ABNORMAL HIGH (ref 70–99)
Potassium: 5.7 mmol/L — ABNORMAL HIGH (ref 3.5–5.1)
Sodium: 137 mmol/L (ref 135–145)

## 2023-01-25 LAB — CBC WITH DIFFERENTIAL/PLATELET
Abs Immature Granulocytes: 0.03 10*3/uL (ref 0.00–0.07)
Basophils Absolute: 0 10*3/uL (ref 0.0–0.1)
Basophils Relative: 0 %
Eosinophils Absolute: 0 10*3/uL (ref 0.0–0.5)
Eosinophils Relative: 0 %
HCT: 32.5 % — ABNORMAL LOW (ref 39.0–52.0)
Hemoglobin: 10.7 g/dL — ABNORMAL LOW (ref 13.0–17.0)
Immature Granulocytes: 1 %
Lymphocytes Relative: 6 %
Lymphs Abs: 0.3 10*3/uL — ABNORMAL LOW (ref 0.7–4.0)
MCH: 29.3 pg (ref 26.0–34.0)
MCHC: 32.9 g/dL (ref 30.0–36.0)
MCV: 89 fL (ref 80.0–100.0)
Monocytes Absolute: 0.1 10*3/uL (ref 0.1–1.0)
Monocytes Relative: 2 %
Neutro Abs: 4.4 10*3/uL (ref 1.7–7.7)
Neutrophils Relative %: 91 %
Platelets: 151 10*3/uL (ref 150–400)
RBC: 3.65 MIL/uL — ABNORMAL LOW (ref 4.22–5.81)
RDW: 14.5 % (ref 11.5–15.5)
WBC: 4.8 10*3/uL (ref 4.0–10.5)
nRBC: 0 % (ref 0.0–0.2)

## 2023-01-25 LAB — SURGICAL PATHOLOGY

## 2023-01-25 LAB — VANCOMYCIN, RANDOM: Vancomycin Rm: 13 ug/mL

## 2023-01-25 MED ORDER — IODIXANOL 320 MG/ML IV SOLN
INTRAVENOUS | Status: DC | PRN
  Administered 2023-01-24: 65 mL

## 2023-01-25 MED ORDER — HEPARIN SODIUM (PORCINE) 1000 UNIT/ML DIALYSIS: 1000.0000 [IU] | INTRAMUSCULAR | Status: DC | PRN

## 2023-01-25 MED ORDER — HEPARIN SODIUM (PORCINE) 1000 UNIT/ML IJ SOLN
INTRAMUSCULAR | Status: AC
  Filled 2023-01-25: qty 10

## 2023-01-25 MED ORDER — OXYCODONE HCL 5 MG PO TABS
ORAL_TABLET | ORAL | Status: AC
  Filled 2023-01-25: qty 1

## 2023-01-25 MED ORDER — ALTEPLASE 2 MG IJ SOLR: 2.0000 mg | Freq: Once | INTRAMUSCULAR | Status: DC | PRN

## 2023-01-25 NOTE — Plan of Care (Signed)
  Problem: Fluid Volume: Goal: Ability to maintain a balanced intake and output will improve Outcome: Progressing   Problem: Health Behavior/Discharge Planning: Goal: Ability to identify and utilize available resources and services will improve Outcome: Progressing Goal: Ability to manage health-related needs will improve Outcome: Progressing   Problem: Metabolic: Goal: Ability to maintain appropriate glucose levels will improve Outcome: Progressing   Problem: Nutritional: Goal: Maintenance of adequate nutrition will improve Outcome: Progressing   Problem: Education: Goal: Knowledge of General Education information will improve Description: Including pain rating scale, medication(s)/side effects and non-pharmacologic comfort measures Outcome: Progressing   Problem: Clinical Measurements: Goal: Ability to maintain clinical measurements within normal limits will improve Outcome: Progressing   Problem: Activity: Goal: Risk for activity intolerance will decrease Outcome: Progressing   Problem: Nutrition: Goal: Adequate nutrition will be maintained Outcome: Progressing   Problem: Coping: Goal: Level of anxiety will decrease Outcome: Progressing

## 2023-01-25 NOTE — Consult Note (Signed)
WOC Nurse wound follow up; patient postop R foot debridement w/bone biopsy and NPWT placement 01/23/2023  Wound type: full thickness R lateral foot post debridement  Measurement: 4 cm x 4 cm x 0.8 cm  Wound bed:  100% red moist  Drainage (amount, consistency, odor) minimal serosanguinous  Periwound: intact  Dressing procedure/placement/frequency: Removed old NPWT dressing Cleansed wound with normal saline Periwound skin protected with skin barrier wipe  Skin protected to the anterior foot with VAC drape for foam bridge; I did cut a barrier ring in half and place at 12 o'clock and 6 o'clock to enhance seal  Filled wound with  1 piece of black foam; bridged to anterior foot as above   Sealed NPWT dressing at HG; I placed an ABD pad in between patients leg and Vac tubing then wrapped in Kerlix roll gauze and Ace bandage to secure.   Patient received PO pain medication per bedside nurse prior to dressing change Patient tolerated procedure well  WOC nurse will continue to provide NPWT dressing changes due to the complexity of the dressing change Mon/Thurs while inpatient ok per Dr. Ether Griffins.  Plans for home will be 3 times weekly. Home supplies in room at time of this visit.     Thank you,    Priscella Mann MSN, RN-BC, [employer] [phone]

## 2023-01-25 NOTE — Plan of Care (Signed)
  Problem: Education: Goal: Ability to describe self-care measures that may prevent or decrease complications (Diabetes Survival Skills Education) will improve Outcome: Progressing Goal: Individualized Educational Video(s) Outcome: Progressing   Problem: Coping: Goal: Ability to adjust to condition or change in health will improve Outcome: Progressing   Problem: Fluid Volume: Goal: Ability to maintain a balanced intake and output will improve Outcome: Progressing   Problem: Health Behavior/Discharge Planning: Goal: Ability to identify and utilize available resources and services will improve Outcome: Progressing Goal: Ability to manage health-related needs will improve Outcome: Progressing   Problem: Metabolic: Goal: Ability to maintain appropriate glucose levels will improve Outcome: Progressing   Problem: Nutritional: Goal: Maintenance of adequate nutrition will improve Outcome: Progressing Goal: Progress toward achieving an optimal weight will improve Outcome: Progressing   Problem: Skin Integrity: Goal: Risk for impaired skin integrity will decrease Outcome: Progressing   Problem: Tissue Perfusion: Goal: Adequacy of tissue perfusion will improve Outcome: Progressing   Problem: Education: Goal: Knowledge of General Education information will improve Description: Including pain rating scale, medication(s)/side effects and non-pharmacologic comfort measures Outcome: Progressing   Problem: Health Behavior/Discharge Planning: Goal: Ability to manage health-related needs will improve Outcome: Progressing   Problem: Clinical Measurements: Goal: Ability to maintain clinical measurements within normal limits will improve Outcome: Progressing Goal: Will remain free from infection Outcome: Progressing Goal: Diagnostic test results will improve Outcome: Progressing   Problem: Activity: Goal: Risk for activity intolerance will decrease Outcome: Progressing   Problem:  Nutrition: Goal: Adequate nutrition will be maintained Outcome: Progressing   Problem: Coping: Goal: Level of anxiety will decrease Outcome: Progressing   Problem: Elimination: Goal: Will not experience complications related to bowel motility Outcome: Progressing Goal: Will not experience complications related to urinary retention Outcome: Progressing   Problem: Pain Managment: Goal: General experience of comfort will improve Outcome: Progressing   Problem: Safety: Goal: Ability to remain free from injury will improve Outcome: Progressing   Problem: Skin Integrity: Goal: Risk for impaired skin integrity will decrease Outcome: Progressing

## 2023-01-25 NOTE — Progress Notes (Signed)
Pt received from HD via bed in stable condition.

## 2023-01-25 NOTE — Progress Notes (Signed)
Date of Admission:  01/21/2023      ID: Patrick Downs is a 47 y.o. male Principal Problem:   Cellulitis Active Problems:   ESRD on dialysis (HCC)   GERD (gastroesophageal reflux disease)   Polyneuropathy, unspecified   Type 2 diabetes mellitus with peripheral neuropathy (HCC)   Ulcer of right foot (HCC)    Subjective: Says he is doing okay  Medications:   (feeding supplement) PROSource Plus  30 mL Oral TID BM   vitamin C  500 mg Oral BID   Chlorhexidine Gluconate Cloth  6 each Topical Daily   heparin  5,000 Units Subcutaneous Q8H   multivitamin  1 tablet Oral QHS   nutrition supplement (JUVEN)  1 packet Oral BID BM   sodium chloride flush  3 mL Intravenous Q12H   zinc sulfate  220 mg Oral Daily    Objective: Vital signs in last 24 hours: Patient Vitals for the past 24 hrs:  BP Temp Temp src Pulse Resp SpO2 Weight  01/25/23 1631 94/67 97.8 F (36.6 C) Oral 66 16 -- --  01/25/23 1228 93/66 98.3 F (36.8 C) -- 85 17 98 % --  01/25/23 1206 -- -- -- -- -- -- 121.3 kg  01/25/23 1155 109/73 98.4 F (36.9 C) Oral 89 12 94 % --  01/25/23 1130 100/70 -- -- 87 16 96 % --  01/25/23 1100 93/66 -- -- 90 17 94 % --  01/25/23 1030 119/75 -- -- 92 16 98 % --  01/25/23 1000 98/63 -- -- 93 17 95 % --  01/25/23 0930 107/71 -- -- 95 (!) 22 96 % --  01/25/23 0900 96/65 -- -- 97 (!) 24 100 % --  01/25/23 0830 104/73 -- -- 85 17 97 % --  01/25/23 0814 111/63 -- -- 80 13 98 % --  01/25/23 0810 -- -- -- -- -- -- 123.2 kg  01/25/23 0802 (!) 105/59 97.7 F (36.5 C) Oral 83 13 97 % --  01/25/23 0433 90/65 97.9 F (36.6 C) Oral 90 19 95 % --  01/24/23 2103 106/67 97.7 F (36.5 C) -- 87 18 98 % --    Dialysis cath rt neck area  PHYSICAL EXAM:  General: Alert, cooperative, no distress, appears stated age.  Head: Normocephalic, without obvious abnormality, atraumatic. Eyes: Conjunctivae clear, anicteric sclerae. Pupils are equal ENT Nares normal. No drainage or sinus  tenderness. Lips, mucosa, and tongue normal. No Thrush Neck: Supple, symmetrical, no adenopathy, thyroid: non tender no carotid bruit and no JVD. Back: No CVA tenderness. Lungs: Clear to auscultation bilaterally. No Wheezing or Rhonchi. No rales. Heart: Regular rate and rhythm, no murmur, rub or gallop. Abdomen: Soft, non-tender,not distended. Bowel sounds normal. No masses Extremities:  01/25/23  01/22/23     Skin: No rashes or lesions. Or bruising Lymph: Cervical, supraclavicular normal. Neurologic: contractures hands  Lab Results    Latest Ref Rng & Units 01/25/2023    4:50 AM 01/24/2023    4:58 AM 01/23/2023    7:49 AM  CBC  WBC 4.0 - 10.5 K/uL 4.8  4.4  5.9   Hemoglobin 13.0 - 17.0 g/dL 32.3  9.2  9.3   Hematocrit 39.0 - 52.0 % 32.5  28.0  27.9   Platelets 150 - 400 K/uL 151  130  134        Latest Ref Rng & Units 01/25/2023    4:50 AM 01/24/2023    4:58 AM 01/23/2023    7:49 AM  CMP  Glucose 70 - 99 mg/dL 161  096  90   BUN 6 - 20 mg/dL 84  60  77   Creatinine 0.61 - 1.24 mg/dL 04.54  09.81  19.14   Sodium 135 - 145 mmol/L 137  136  135   Potassium 3.5 - 5.1 mmol/L 5.7  4.6  4.6   Chloride 98 - 111 mmol/L 101  99  97   CO2 22 - 32 mmol/L 20  25  22    Calcium 8.9 - 10.3 mg/dL 6.9  6.6  6.5       Microbiology: Foot culture pending   Assessment/Plan: DM with DFI Rt gangrenous ulcer s/p debridement and  bone culture sent Pt currently on triple antibiotic- ( vanco/cefepime and flagyl).culture pending Pathology showed focal osteomyelitis Discussed with Dr.Fowler- will send him on IV antibiotic for 4 weeks Once we know culture we can decide of final antibiotics- if culture neg then would do vanco and cefepime during dialysis   PAD s/p stent to Rt popliteal   ESRD on dialysis Multiple failed AVF Now has permacath   H/o GBS with neurological deficit  Discussed the management with the patient

## 2023-01-25 NOTE — Progress Notes (Signed)
PT Cancellation Note  Patient Details Name: NAT LOWENTHAL MRN: [id number] DOB: November 30, 1975   Cancelled Treatment:    Reason Eval/Treat Not Completed: Other (comment).  Pt currently off floor at dialysis.  Will re-attempt PT evaluation at a later date/time.  Hendricks Limes, PT 01/25/23, 10:37 AM

## 2023-01-25 NOTE — Progress Notes (Signed)
Pt sent for HD via bed in stable condition.

## 2023-01-25 NOTE — Progress Notes (Signed)
[facility]  ROUNDING NOTE   Subjective:   Patient seen and evaluated during dialysis   HEMODIALYSIS FLOWSHEET:  Blood Flow Rate (mL/min): 399 mL/min Arterial Pressure (mmHg): -280.39 mmHg Venous Pressure (mmHg): 161.41 mmHg TMP (mmHg): 17.17 mmHg Ultrafiltration Rate (mL/min): 829 mL/min Dialysate Flow Rate (mL/min): 299 ml/min Dialysis Fluid Bolus: Normal Saline  Tolerating treatment well Patient reports pain well-controlled  Objective:  Vital signs in last 24 hours:  Temp:  [97.5 F (36.4 C)-98 F (36.7 C)] 97.7 F (36.5 C) (10/03 0802) Pulse Rate:  [0-97] 95 (10/03 0930) Resp:  [6-24] 22 (10/03 0930) BP: (79-123)/(32-73) 107/71 (10/03 0930) SpO2:  [91 %-100 %] 96 % (10/03 0930) Weight:  [123.2 kg] 123.2 kg (10/03 0810)  Weight change:  Filed Weights   01/23/23 0746 01/23/23 1126 01/25/23 0810  Weight: 124.6 kg 122.5 kg 123.2 kg    Intake/Output: I/O last 3 completed shifts: In: 478.7 [I.V.:28.7; IV Piggyback:450] Out: -    Intake/Output this shift:  No intake/output data recorded.  Physical Exam: General: No acute distress  Head: Normocephalic, atraumatic. Moist oral mucosal membranes  Neck: Supple  Lungs:  Clear to auscultation, normal effort  Heart: S1S2 no rubs  Abdomen:  Soft, nontender, bowel sounds present  Extremities: No peripheral edema.RLE NPWV  Neurologic: Awake, alert, following commands  Skin: No acute rash, right foot surgical dressing  Access: Right IJ PermCath    Basic Metabolic Panel: Recent Labs  Lab 01/21/23 0451 01/22/23 0501 01/23/23 0749 01/24/23 0458 01/25/23 0450  NA 140 135 135 136 137  K 3.8 4.7 4.6 4.6 5.7*  CL 109 97* 97* 99 101  CO2 21* 23 22 25  20*  GLUCOSE 91 111* 90 102* 209*  BUN 31* 56* 77* 60* 84*  CREATININE 7.66* 13.12* 15.15* 12.31* 14.63*  CALCIUM 5.2* 6.7* 6.5* 6.6* 6.9*  MG 1.6*  --   --   --   --   PHOS 2.6  --   --   --   --     Liver Function Tests: Recent Labs  Lab  01/21/23 0451 01/22/23 0501  AST 26 12*  ALT 10 14  ALKPHOS 34* 47  BILITOT 1.5* 0.9  PROT 5.6* 6.7  ALBUMIN 2.7* 3.1*   No results for input(s): "LIPASE", "AMYLASE" in the last 168 hours. No results for input(s): "AMMONIA" in the last 168 hours.  CBC: Recent Labs  Lab 01/21/23 0451 01/22/23 0501 01/23/23 0749 01/24/23 0458 01/25/23 0450  WBC 8.1 5.5 5.9 4.4 4.8  NEUTROABS 6.2  --  4.0 2.8 4.4  HGB 10.2* 10.0* 9.3* 9.2* 10.7*  HCT 32.4* 30.7* 27.9* 28.0* 32.5*  MCV 93.4 90.6 89.4 89.2 89.0  PLT 150 137* 134* 130* 151    Cardiac Enzymes: No results for input(s): "CKTOTAL", "CKMB", "CKMBINDEX", "TROPONINI" in the last 168 hours.  BNP: Invalid input(s): "POCBNP"  CBG: Recent Labs  Lab 01/23/23 1612 01/23/23 2247 01/24/23 0830 01/24/23 1118 01/24/23 2103  GLUCAP 185* 105* 85 93 216*    Microbiology: Results for orders placed or performed during the hospital encounter of 01/21/23  Blood Culture (routine x 2)     Status: None (Preliminary result)   Collection Time: 01/21/23  5:08 AM   Specimen: BLOOD  Result Value Ref Range Status   Specimen Description BLOOD BLOOD RIGHT ARM  Final   Special Requests   Final    BOTTLES DRAWN AEROBIC AND ANAEROBIC Blood Culture results may not be optimal due to an inadequate volume of  blood received in culture bottles   Culture   Final    NO GROWTH 3 DAYS Performed at [facility], [address] [place], [place] [address]    Report Status PENDING  Incomplete  Blood Culture (routine x 2)     Status: None (Preliminary result)   Collection Time: 01/21/23  5:31 AM   Specimen: BLOOD  Result Value Ref Range Status   Specimen Description BLOOD BLOOD LEFT ARM  Final   Special Requests   Final    BOTTLES DRAWN AEROBIC AND ANAEROBIC Blood Culture results may not be optimal due to an inadequate volume of blood received in culture bottles   Culture   Final    NO GROWTH 3 DAYS Performed at [facility], [address] [place], [place] [address]    Report Status PENDING  Incomplete  Aerobic/Anaerobic Culture w Gram Stain (surgical/deep wound)     Status: None (Preliminary result)   Collection Time: 01/23/23  1:13 PM   Specimen: Wound; Body Fluid  Result Value Ref Range Status   Specimen Description   Final    WOUND Performed at [facility], [address], [place], [place] [address]    Special Requests   Final    RIGHT 5TH METATARSEL ULCER Performed at [facility], [address] [place], [place] [address]    Gram Stain NO WBC SEEN NO ORGANISMS SEEN   Final   Culture   Final    CULTURE REINCUBATED FOR BETTER GROWTH HOLDING FOR POSSIBLE ANAEROBE Performed at [facility], [address], [place], [place] [address]    Report Status PENDING  Incomplete  Aerobic/Anaerobic Culture w Gram Stain (surgical/deep wound)     Status: None (Preliminary result)   Collection Time: 01/23/23  1:16 PM   Specimen: Wound  Result Value Ref Range Status   Specimen Description   Final    WOUND Performed at [facility], [address], [place], [place] [address]    Special Requests   Final    RIGHT 5TH MATATARSEL BONE Performed at [facility], [address] [place], [place] [address]    Gram Stain NO WBC SEEN NO ORGANISMS SEEN   Final   Culture   Final    NO GROWTH 2 DAYS Performed at [facility], [address] [place], [place] [address]    Report Status PENDING  Incomplete    Coagulation Studies: No results for input(s): "LABPROT", "INR" in the last 72 hours.   Urinalysis: No results for input(s): "COLORURINE", "LABSPEC", "PHURINE", "GLUCOSEU", "HGBUR", "BILIRUBINUR", "KETONESUR", "PROTEINUR", "UROBILINOGEN", "NITRITE", "LEUKOCYTESUR" in the last 72 hours.  Invalid input(s): "APPERANCEUR"    Imaging: PERIPHERAL VASCULAR CATHETERIZATION  Result Date: 01/24/2023 See surgical note for result.    Medications:    sodium  chloride     ceFEPime (MAXIPIME) IV 1 g (01/24/23 1800)   metronidazole 500 mg (01/24/23 2139)   sodium chloride 250 mL/hr at 01/24/23 1453   vancomycin      (feeding supplement) PROSource Plus  30 mL Oral TID BM   vitamin C  500 mg Oral BID   Chlorhexidine Gluconate Cloth  6 each Topical Daily   heparin  5,000 Units Subcutaneous Q8H   insulin aspart  0-6 Units Subcutaneous TID WC   multivitamin  1 tablet Oral QHS   nutrition supplement (JUVEN)  1 packet Oral BID BM   sodium chloride flush  3 mL Intravenous Q12H  zinc sulfate  220 mg Oral Daily   sodium chloride, acetaminophen, alteplase, alteplase, heparin, hydrOXYzine, iodixanol, oxyCODONE-acetaminophen **AND** oxyCODONE, sodium chloride flush  Assessment/ Plan:  47 y.o. male ESRD on HD TTS, CHF, history of Guillain-Barr syndrome, hypertension, anemia of chronic kidney disease, secondary hyperparathyroidism, history of right foot toe amputation who presents with ulceration on lateral aspect of right foot.  [facility] [address]  1.  ESRD on HD TTS.  Receiving dialysis today, UF goal 2 L as tolerated.  Next treatment scheduled for Saturday.  2.  Anemia of chronic kidney disease.   Lab Results  Component Value Date   HGB 10.7 (L) 01/25/2023   Patient receives Mircera as outpatient.  Hemoglobin  10.7.   3.  Secondary hyperparathyroidism.  Calcium 6.9. Correctling slowly with dialysis. Patient prescribed auryxia with meals outpatient   4.  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  Currently on vancomycin and metronidazole.  Imaging suspicious for osteomyelitis.  Podiatry performed debridement on 01/23/23 with placement on wound vac. Vascular performed angiogram on 01/24/23 with stent placement.     LOS: 4   10/3/202410:19 AM

## 2023-01-25 NOTE — Progress Notes (Signed)
  Received patient in bed to unit.   Informed consent signed and in chart.    TX duration: 3.5hrs     Transported back to floor  Hand-off given to patient's nurse. No c/o and no distress noted    Access used: R HD catheter  Access issues: none   Total UF removed:  2.0L Medication(s) given: 1gm vancomycin, 5mg  oxycodone  Post HD VS: 109/73 Post HD weight: 121.3kg     Lynann Beaver  [facility]

## 2023-01-25 NOTE — Progress Notes (Addendum)
Daily Progress Note   Subjective  - 1 Day Post-Op  Follow-up right foot debridement with bone biopsy and wound VAC placement.  No complaints today.  Just returned from dialysis.  Objective Vitals:   01/25/23 1130 01/25/23 1155 01/25/23 1206 01/25/23 1228  BP: 100/70 109/73  93/66  Pulse: 87 89  85  Resp: 16 12  17   Temp:  98.4 F (36.9 C)  98.3 F (36.8 C)  TempSrc:  Oral    SpO2: 96% 94%  98%  Weight:   121.3 kg   Height:        Physical Exam: Dressing change.  Minimal bleeding.  Good healthy granular tissue.  Wound VAC was intact.    Laboratory CBC    Component Value Date/Time   WBC 4.8 01/25/2023 0450   HGB 10.7 (L) 01/25/2023 0450   HGB 7.7 (L) 08/22/2014 0653   HCT 32.5 (L) 01/25/2023 0450   HCT 23.9 (L) 08/22/2014 0653   PLT 151 01/25/2023 0450   PLT 195 08/22/2014 0653    BMET    Component Value Date/Time   NA 137 01/25/2023 0450   NA 136 08/22/2014 0653   K 5.7 (H) 01/25/2023 0450   K 5.1 08/22/2014 0653   CL 101 01/25/2023 0450   CL 95 (L) 08/22/2014 0653   CO2 20 (L) 01/25/2023 0450   CO2 27 08/22/2014 0653   GLUCOSE 209 (H) 01/25/2023 0450   GLUCOSE 176 (H) 08/22/2014 0653   BUN 84 (H) 01/25/2023 0450   BUN 71 (H) 08/22/2014 0653   CREATININE 14.63 (H) 01/25/2023 0450   CREATININE 8.87 (H) 08/22/2014 0653   CALCIUM 6.9 (L) 01/25/2023 0450   CALCIUM 7.1 (L) 08/22/2014 0653   GFRNONAA 4 (L) 01/25/2023 0450   GFRNONAA 7 (L) 08/22/2014 0653   GFRAA NOT CALCULATED 07/31/2022 1530   GFRAA 8 (L) 08/22/2014 0653    Assessment/Planning: Osteomyelitis right fifth metatarsal Gangrene with necrosis ulcer right fifth metatarsal  Wound VAC removed and replaced with assistance of wound care nurse today.  Okay for wound VAC to be changed Monday and Thursday by wound care nurse while patient is still in house. Upon discharge recommend 3 times a week wound VAC changes to right foot. Physical therapy has been ordered for nonweightbearing. Appreciate  infectious disease consult recommendations for antibiotics going forward. Patient should follow-up in 2 weeks with podiatry. From podiatry standpoint patient is stable for discharge. Current IntraOp  cultures are inconclusive.  No growth on cultures.  Pathology was resulted with findings consistent with chronic osteomyelitis. Discussed with patient long-term wound care will be needed for this.  If this fails wound care or infection progresses or erosive changes progress patient will need further debridement of bone and excision of further bone intraoperatively  For now I would prefer to perform local wound care with antibiotics and follow in the outpatient clinic. Podiatry will follow at a distance.  Please reconsult if needed. [name]  01/25/2023, 12:41 PM

## 2023-01-25 NOTE — Progress Notes (Signed)
Progress Note   Patient: Patrick Downs ZOX:[id number] DOB: 1975/07/12 DOA: 01/21/2023     4 DOS: the patient was seen and examined on 01/25/2023      Subjective:  Patient seen in dialysis.  No acute complaints. He's been refusing CBG's and insulin per nursing.  I will d/c these since his A1c is 6% and CBG's at goal. Will follow fasting sugars on labs for now.     Brief hospital course: From HPI "NIKUNJ LIPSETT is a 47 y.o. male with medical history significant of ESRD on hemodialysis Tuesday Thursday Saturday, GERD, hypertension, gout, type 2 diabetes, peripheral neuropathy, prior toe amputation presenting with right lower extremity cellulitis.  Patient reports ulcer formation on the right lateral foot.  Has had worsening drainage over multiple days.  Mom malaise.  Patient does wear a brace on the right foot.  Has been unable to wear brace secondary to swelling.  Noted malodorous drainage from wound.  No fevers or chills.  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  Non-smoker.  Patient denies any alcohol use.  Has been compliant with hemodialysis regimen. Presented to the ER Tmax 99.4, blood pressures 90s to 110s over 50s to 70s.  Satting well on room air.  White count 8.1, hemoglobin 10.2, platelets 150.  Creatinine 7.7, T. bili 1.5.  Right foot plain films with superficial soft tissue wound with subcutaneous gas adjacent to the base of the fifth metatarsal bone.  "   Assessment and Plan:   Cellulitis of the right fourth toe with underlying osteomyelitis Right lateral foot cellulitis in setting of end-stage renal disease with prior amputations in the past MRI of the right foot showing soft tissue ulceration lateral to the base of the fifth metatarsal with underlying marrow changes at the base of the fifth metatarsal suspicious for osteomyelitis.  No abscess detected. 10/1 - podiatry did excisional debridement and bone biopsy, wound vac placed - See Op Note 10/2 - vascular did  RLE angio & re-vascularization - see Op Note --Continue broad spectrum abx (Vanc/Cefepime/Flagyl) --Consults -- Infectious disease, Vascular surgery, Podiatry --Follow cultures & tailor antibiotics accordingly --Wound care RN following --Wound vac changes Monday/Thursday while admitted.   --At d/c, wound vac changes 3 times / week --Non-weight bearing on RIGHT foot --PT evaluation --Follow up with Podiatry in 2 weeks     Type 2 diabetes mellitus with peripheral neuropathy (HCC) Well controlled - A1c 6.0% Pt refusing CBG's and insulin per nursing --D/C CBG's and sliding scale insulin for now --Resume if fasting glucose on BMP's is > 180   ESRD on dialysis (HCC) Baseline ESRD on HD TTS  No reported missed episodes of HD  --Nephrology following for dialysis --Monitor renal function panels     Polyneuropathy, unspecified Continue Lyrica   GERD (gastroesophageal reflux disease) PPI      Advance Care Planning:   Code Status: Full Code    Consults: nephrology, podiatry, vascular surgery   Family Communication: None at bedside         Physical Exam: General exam: awake, alert, no acute distress HEENT: moist mucus membranes, hearing grossly normal  Respiratory system: on room air, normal respiratory effort. Cardiovascular system: RRR no murmur rub gallp Gastrointestinal system: soft, non-tender, non-distended Central nervous system: A&O x 3. no gross focal neurologic deficits, normal speech Extremities: right foot wound vac in place Skin: dry, intact, normal temperature Psychiatry: normal mood, congruent affect, judgement and insight appear normal  Data Reviewed: I have reviewed patient's lab results as shown below as well as vascular surgeon and podiatrist documentation    Latest Ref Rng & Units 01/25/2023    4:50 AM 01/24/2023    4:58 AM 01/23/2023    7:49 AM  CBC  WBC 4.0 - 10.5 K/uL 4.8  4.4  5.9   Hemoglobin 13.0 - 17.0 g/dL 59.5  9.2  9.3   Hematocrit  39.0 - 52.0 % 32.5  28.0  27.9   Platelets 150 - 400 K/uL 151  130  134        Latest Ref Rng & Units 01/25/2023    4:50 AM 01/24/2023    4:58 AM 01/23/2023    7:49 AM  BMP  Glucose 70 - 99 mg/dL 638  756  90   BUN 6 - 20 mg/dL 84  60  77   Creatinine 0.61 - 1.24 mg/dL 43.32  95.18  84.16   Sodium 135 - 145 mmol/L 137  136  135   Potassium 3.5 - 5.1 mmol/L 5.7  4.6  4.6   Chloride 98 - 111 mmol/L 101  99  97   CO2 22 - 32 mmol/L 20  25  22    Calcium 8.9 - 10.3 mg/dL 6.9  6.6  6.5      Vitals:   01/25/23 1130 01/25/23 1155 01/25/23 1206 01/25/23 1228  BP: 100/70 109/73  93/66  Pulse: 87 89  85  Resp: 16 12  17   Temp:  98.4 F (36.9 C)  98.3 F (36.8 C)  TempSrc:  Oral    SpO2: 96% 94%  98%  Weight:   121.3 kg   Height:        Author: Pennie Banter, DO 01/25/2023 2:42 PM  For on call review [email or web address].

## 2023-01-26 DIAGNOSIS — N186 End stage renal disease: Secondary | ICD-10-CM | POA: Diagnosis not present

## 2023-01-26 DIAGNOSIS — E11628 Type 2 diabetes mellitus with other skin complications: Secondary | ICD-10-CM | POA: Diagnosis not present

## 2023-01-26 DIAGNOSIS — L97519 Non-pressure chronic ulcer of other part of right foot with unspecified severity: Secondary | ICD-10-CM | POA: Diagnosis not present

## 2023-01-26 DIAGNOSIS — L089 Local infection of the skin and subcutaneous tissue, unspecified: Secondary | ICD-10-CM | POA: Diagnosis not present

## 2023-01-26 DIAGNOSIS — Z992 Dependence on renal dialysis: Secondary | ICD-10-CM | POA: Diagnosis not present

## 2023-01-26 DIAGNOSIS — E1142 Type 2 diabetes mellitus with diabetic polyneuropathy: Secondary | ICD-10-CM | POA: Diagnosis not present

## 2023-01-26 DIAGNOSIS — G629 Polyneuropathy, unspecified: Secondary | ICD-10-CM | POA: Diagnosis not present

## 2023-01-26 LAB — CULTURE, BLOOD (ROUTINE X 2)
Culture: NO GROWTH
Culture: NO GROWTH

## 2023-01-26 LAB — RENAL FUNCTION PANEL
Albumin: 3.1 g/dL — ABNORMAL LOW (ref 3.5–5.0)
Anion gap: 15 (ref 5–15)
BUN: 71 mg/dL — ABNORMAL HIGH (ref 6–20)
CO2: 24 mmol/L (ref 22–32)
Calcium: 6.8 mg/dL — ABNORMAL LOW (ref 8.9–10.3)
Chloride: 98 mmol/L (ref 98–111)
Creatinine, Ser: 10.57 mg/dL — ABNORMAL HIGH (ref 0.61–1.24)
GFR, Estimated: 6 mL/min — ABNORMAL LOW (ref >60)
Glucose, Bld: 162 mg/dL — ABNORMAL HIGH (ref 70–99)
Phosphorus: 4.4 mg/dL (ref 2.5–4.6)
Potassium: 5.1 mmol/L (ref 3.5–5.1)
Sodium: 137 mmol/L (ref 135–145)

## 2023-01-26 MED ORDER — CEFAZOLIN SODIUM-DEXTROSE 1-4 GM/50ML-% IV SOLN
1.0000 g | Freq: Every day | INTRAVENOUS | Status: DC
  Administered 2023-01-26 – 2023-01-29 (×4): 1 g via INTRAVENOUS
  Filled 2023-01-26 (×5): qty 50

## 2023-01-26 NOTE — Evaluation (Signed)
Physical Therapy Evaluation Patient Details Name: Patrick Downs MRN: [id number] DOB: [birth year] Today's Date: 01/26/2023  History of Present Illness  Pt is a 47 y.o. male presenting to hospital 01/21/23 with c/o wound to R lateral foot (present for about a month).  Pt admitted with R lateral foot cellulitis.  S/p 01/23/23 " I&D with debridement of wound, bone biopsies, antibiotic bead application, and wound VAC" R LE.  10/2 s/p angio and re-vascularization.  PMH includes ESRD on HD, CHF, htn, GBS, peripheral neuropathy, B LE toe amputations.  Clinical Impression  Prior to hospital admission, pt was modified independent with functional mobility (uses B LE AFO's d/t B foot drop); lives alone in 1 level home with 4 STE L railing.  2/10 R foot pain beginning/end of session at rest.  Pt reports walking to bathroom earlier today (no AD use and putting weight through R LE).  Pt educated on NWB'ing R LE order (PT wrote precautions on white board and discussed with pt's nurse).  L LE AFO donned for sessions activities.  Currently pt is modified independent with bed mobility; min assist to stand from bed up to RW; and CGA to ambulate 20 feet in room with RW use.  Pt initially mildly unsteady with ambulation but improved quickly to steady, controlled, hop to gait pattern.  Pt did well maintaining NWB'ing precautions during session.  Pt would currently benefit from skilled PT to address noted impairments and functional limitations (see below for any additional details).  Upon hospital discharge, pt would benefit from ongoing therapy.     If plan is discharge home, recommend the following: A little help with walking and/or transfers;A little help with bathing/dressing/bathroom;Assistance with cooking/housework;Assist for transportation;Help with stairs or ramp for entrance   Can travel by private vehicle        Equipment Recommendations Rolling walker (2 wheels) (pt agreeable to RW but declining BSC)   Recommendations for Other Services  OT consult    Functional Status Assessment Patient has had a recent decline in their functional status and demonstrates the ability to make significant improvements in function in a reasonable and predictable amount of time.     Precautions / Restrictions Precautions Precautions: Fall Precaution Comments: B foot drop (AFO's); R IJ permcath Restrictions Weight Bearing Restrictions: Yes RLE Weight Bearing: Non weight bearing Other Position/Activity Restrictions: NWB R foot      Mobility  Bed Mobility Overal bed mobility: Modified Independent Bed Mobility: Supine to Sit, Sit to Supine     Supine to sit: Modified independent (Device/Increase time), HOB elevated Sit to supine: Modified independent (Device/Increase time), HOB elevated   General bed mobility comments: No difficulties noted    Transfers Overall transfer level: Needs assistance Equipment used: Rolling walker (2 wheels) Transfers: Sit to/from Stand Sit to Stand: Min assist           General transfer comment: min assist to stand up to RW from bed; vc's for NWB'ing status    Ambulation/Gait Ambulation/Gait assistance: Contact guard assist Gait Distance (Feet): 20 Feet Assistive device: Rolling walker (2 wheels)   Gait velocity: decreased     General Gait Details: NWB R LE; initially mildly unsteady but improved quickly to steady controlled hop to gait pattern  Stairs            Wheelchair Mobility     Tilt Bed    Modified Rankin (Stroke Patients Only)       Balance Overall balance assessment: Needs assistance Sitting-balance support: No  upper extremity supported (L foot supported) Sitting balance-Leahy Scale: Normal Sitting balance - Comments: steady reaching outside BOS   Standing balance support: Bilateral upper extremity supported, During functional activity, Reliant on assistive device for balance Standing balance-Leahy Scale: Good Standing  balance comment: steady standing with B UE support on RW                             Pertinent Vitals/Pain Pain Assessment Pain Assessment: 0-10 Pain Score: 2  Pain Location: R foot pain Pain Descriptors / Indicators: Sore, Tender Pain Intervention(s): Limited activity within patient's tolerance, Monitored during session, Repositioned Vitals (HR and SpO2 on room air) stable and WFL throughout treatment session.    Home Living Family/patient expects to be discharged to:: Private residence Living Arrangements: Alone Available Help at Discharge: Friend(s);Available PRN/intermittently Type of Home: House Home Access: Stairs to enter Entrance Stairs-Rails: Left Entrance Stairs-Number of Steps: 4   Home Layout: One level Home Equipment: Grab bars - tub/shower;Other (comment) (B AFO's; has manual w/c but does not fit through doorways)      Prior Function Prior Level of Function : Independent/Modified Independent             Mobility Comments: Ambulatory with B LE AFO's (doesn't always use AFO's in home); (+) driving; independent       Extremity/Trunk Assessment   Upper Extremity Assessment Upper Extremity Assessment: Defer to OT evaluation;Overall WFL for tasks assessed (B hand contractures noted)    Lower Extremity Assessment Lower Extremity Assessment: Generalized weakness (B foot drop)    Cervical / Trunk Assessment Cervical / Trunk Assessment: Normal  Communication   Communication Communication: No apparent difficulties Cueing Techniques: Verbal cues  Cognition Arousal: Alert Behavior During Therapy: WFL for tasks assessed/performed Overall Cognitive Status: Within Functional Limits for tasks assessed                                          General Comments General comments (skin integrity, edema, etc.): R LE wound vac and dressings in place beginning/end of session.  Pt agreeable to PT session.  Pt's friend present during  session.    Exercises  Gait training   Assessment/Plan    PT Assessment Patient needs continued PT services  PT Problem List Decreased strength;Decreased activity tolerance;Decreased balance;Decreased mobility;Decreased knowledge of use of DME;Decreased knowledge of precautions;Decreased skin integrity;Pain       PT Treatment Interventions DME instruction;Gait training;Stair training;Functional mobility training;Therapeutic activities;Therapeutic exercise;Balance training;Patient/family education    PT Goals (Current goals can be found in the Care Plan section)  Acute Rehab PT Goals Patient Stated Goal: to improve mobility PT Goal Formulation: With patient Time For Goal Achievement: 02/09/23 Potential to Achieve Goals: Good    Frequency Min 1X/week     Co-evaluation               AM-PAC PT "6 Clicks" Mobility  Outcome Measure Help needed turning from your back to your side while in a flat bed without using bedrails?: None Help needed moving from lying on your back to sitting on the side of a flat bed without using bedrails?: None Help needed moving to and from a bed to a chair (including a wheelchair)?: A Little Help needed standing up from a chair using your arms (e.g., wheelchair or bedside chair)?: A Little Help needed to walk  in hospital room?: A Little Help needed climbing 3-5 steps with a railing? : A Lot 6 Click Score: 19    End of Session Equipment Utilized During Treatment: Gait belt Activity Tolerance: Patient tolerated treatment well Patient left: in bed;with call bell/phone within reach;with bed alarm set;with family/visitor present;Other (comment) (R LE elevated) Nurse Communication: Mobility status;Precautions PT Visit Diagnosis: Unsteadiness on feet (R26.81);Other abnormalities of gait and mobility (R26.89);Muscle weakness (generalized) (M62.81);Pain Pain - Right/Left: Right Pain - part of body: Ankle and joints of foot    Time: 1110-1138 PT Time  Calculation (min) (ACUTE ONLY): 28 min   Charges:   PT Evaluation $PT Eval Low Complexity: 1 Low PT Treatments $Gait Training: 8-22 mins PT General Charges $$ ACUTE PT VISIT: 1 Visit        Tirso Laws, PT 01/26/23, 1:32 PM

## 2023-01-26 NOTE — Discharge Planning (Signed)
[facility]EMODIAL[facility] Outpatient Facility  [facility]  [address]  [place] [address] [phone]  Scheduled days: Tuesday Thursday and Saturday  Treatment time: 5:00am  Dimas Chyle Dialysis Coordinator II  [facility] Cell: [phone] eFax: [phone] [email or web address]

## 2023-01-26 NOTE — Plan of Care (Signed)
  Problem: Coping: Goal: Ability to adjust to condition or change in health will improve Outcome: Progressing   Problem: Metabolic: Goal: Ability to maintain appropriate glucose levels will improve Outcome: Progressing   Problem: Nutritional: Goal: Maintenance of adequate nutrition will improve Outcome: Progressing   Problem: Clinical Measurements: Goal: Diagnostic test results will improve Outcome: Progressing   Problem: Activity: Goal: Risk for activity intolerance will decrease Outcome: Progressing   Problem: Nutrition: Goal: Adequate nutrition will be maintained Outcome: Progressing   Problem: Coping: Goal: Level of anxiety will decrease Outcome: Progressing   Problem: Elimination: Goal: Will not experience complications related to bowel motility Outcome: Progressing   Problem: Pain Managment: Goal: General experience of comfort will improve Outcome: Progressing

## 2023-01-26 NOTE — Consult Note (Signed)
Pharmacy Antibiotic Note  Patrick Downs is a 47 y.o. male admitted on 01/21/2023 with  Wound infection .  Pharmacy has been consulted for cefepime and vancomycin dosing. Afeb, WBC 8.1. PMH with ESRD, HTN, gout, DM, prior toe amputation. Patient reports ulcer formation on the right lateral foot. Hx of MSSA infection in 2022.   Today, 01/26/2023 Day 6 of antibiotics WBC WNL Afebrile  Vancomycin 2500 mg load given 9/29, next dose due 10/1 was not given  Vanco random level drawn 10/3 0450 = 13  Plan Continue vancomycin 1000 mg on HD days  Consider checking next vanco random level in 3-5 days if continuing vancomycin  Continue cefepime 1 gm IV daily Metronidazole 500 mg IV BID also ordered by MD F/u culture results to determine final antibiotic plan - will need IV antibiotics outpatient   Height: 6\' 2"  (188 cm) Weight: 121.3 kg (267 lb 6.7 oz) IBW/kg (Calculated) : 82.2  Temp (24hrs), Avg:98 F (36.7 C), Min:97.6 F (36.4 C), Max:98.4 F (36.9 C)  Recent Labs  Lab 01/21/23 0451 01/22/23 0501 01/23/23 0749 01/24/23 0458 01/25/23 0450 01/26/23 0503  WBC 8.1 5.5 5.9 4.4 4.8  --   CREATININE 7.66* 13.12* 15.15* 12.31* 14.63* 10.57*  LATICACIDVEN 1.5  --   --   --   --   --   VANCORANDOM  --   --   --   --  13  --     Estimated Creatinine Clearance: 12.1 mL/min (A) (by C-G formula based on SCr of 10.57 mg/dL (H)).    Antimicrobials this admission: 9/29 cefepime >>  9/29 vancomycin >>  9/29 metronidazole >>  Dose adjustments this admission: all meds adjusted for ESRD  Microbiology results: 9/29 BCx: NG final  10/1 Ulcer cx: holding for possible anaerobe  10/1 Bone cx: NG x 3d   Thank you for allowing pharmacy to be a part of this patient's care.  Littie Deeds, PharmD PGY1 Pharmacy Resident 01/26/2023 9:20 AM

## 2023-01-26 NOTE — Progress Notes (Signed)
Progress Note   Patient: Patrick Downs HQI:[id number] DOB: 29-Apr-1975 DOA: 01/21/2023     5 DOS: the patient was seen and examined on 01/26/2023      Subjective:  Patient seen with friend at bedside today.  He worked with PT on non-weightbearing, says it is difficult.  Otherwise denies acute complaints today. Next dialysis tomorrow     Brief hospital course: From HPI "Patrick Downs is a 47 y.o. male with medical history significant of ESRD on hemodialysis Tuesday Thursday Saturday, GERD, hypertension, gout, type 2 diabetes, peripheral neuropathy, prior toe amputation presenting with right lower extremity cellulitis.  Patient reports ulcer formation on the right lateral foot.  Has had worsening drainage over multiple days.  Mom malaise.  Patient does wear a brace on the right foot.  Has been unable to wear brace secondary to swelling.  Noted malodorous drainage from wound.  No fevers or chills.  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  Non-smoker.  Patient denies any alcohol use.  Has been compliant with hemodialysis regimen. Presented to the ER Tmax 99.4, blood pressures 90s to 110s over 50s to 70s.  Satting well on room air.  White count 8.1, hemoglobin 10.2, platelets 150.  Creatinine 7.7, T. bili 1.5.  Right foot plain films with superficial soft tissue wound with subcutaneous gas adjacent to the base of the fifth metatarsal bone.  "   Assessment and Plan:   Cellulitis of the right fourth toe with underlying osteomyelitis Right lateral foot cellulitis in setting of end-stage renal disease with prior amputations in the past MRI of the right foot showing soft tissue ulceration lateral to the base of the fifth metatarsal with underlying marrow changes at the base of the fifth metatarsal suspicious for osteomyelitis.  No abscess detected. 10/1 - podiatry did excisional debridement and bone biopsy, wound vac placed - See Op Note 10/2 - vascular did RLE angio &  re-vascularization - see Op Note --Initially on broad spectrum - Vanc/Cefepime/Flagyl --Consults -- Infectious disease, Vascular surgery, Podiatry --Now on Ancef - to be given at dialysis TTS --Wound care RN following --Wound vac changes Monday/Thursday while admitted.   --At d/c, wound vac changes 3 times / week --Non-weight bearing on RIGHT foot --PT evaluation - HH recommended --Follow up with Podiatry in 2 weeks     Type 2 diabetes mellitus with peripheral neuropathy (HCC) Well controlled - A1c 6.0% Pt refusing CBG's and insulin per nursing --D/C CBG's and sliding scale insulin for now --Resume if fasting glucose on BMP's is > 180   ESRD on dialysis (HCC) Baseline ESRD on HD TTS  No reported missed episodes of HD  --Nephrology following for dialysis --Monitor renal function panels     Polyneuropathy, unspecified Continue Lyrica   GERD (gastroesophageal reflux disease) PPI      Advance Care Planning:   Code Status: Full Code    Consults: nephrology, podiatry, vascular surgery   Family Communication: None at bedside         Physical Exam: General exam: awake, alert, no acute distress Respiratory system: on room air, normal respiratory effort. Cardiovascular system: normal S1/S2, RRR, no pedal edema.   Gastrointestinal system: soft, NT, ND Central nervous system: A&O x3. no gross focal neurologic deficits, normal speech Extremities: right foot wound vac present, no edema Skin: dry, intact, normal temperature Psychiatry: normal mood, congruent affect, judgement and insight appear normal        Data Reviewed: I have reviewed patient's  lab results as shown below as well as vascular surgeon and podiatrist documentation    Latest Ref Rng & Units 01/25/2023    4:50 AM 01/24/2023    4:58 AM 01/23/2023    7:49 AM  CBC  WBC 4.0 - 10.5 K/uL 4.8  4.4  5.9   Hemoglobin 13.0 - 17.0 g/dL 86.5  9.2  9.3   Hematocrit 39.0 - 52.0 % 32.5  28.0  27.9   Platelets 150 - 400  K/uL 151  130  134        Latest Ref Rng & Units 01/26/2023    5:03 AM 01/25/2023    4:50 AM 01/24/2023    4:58 AM  BMP  Glucose 70 - 99 mg/dL 784  696  295   BUN 6 - 20 mg/dL 71  84  60   Creatinine 0.61 - 1.24 mg/dL 28.41  32.44  01.02   Sodium 135 - 145 mmol/L 137  137  136   Potassium 3.5 - 5.1 mmol/L 5.1  5.7  4.6   Chloride 98 - 111 mmol/L 98  101  99   CO2 22 - 32 mmol/L 24  20  25    Calcium 8.9 - 10.3 mg/dL 6.8  6.9  6.6      Vitals:   01/25/23 2129 01/26/23 0506 01/26/23 0753 01/26/23 1604  BP: 129/69 117/83 113/73 106/72  Pulse: 88 66 74 71  Resp: 18  19 16   Temp: 98.1 F (36.7 C) 97.6 F (36.4 C) 97.7 F (36.5 C) (!) 97.5 F (36.4 C)  TempSrc:  Oral    SpO2: 94% 99% 92% 100%  Weight:      Height:        Author: Pennie Banter, DO 01/26/2023 7:43 PM  For on call review [email or web address].

## 2023-01-26 NOTE — Progress Notes (Signed)
Date of Admission:  01/21/2023      ID: Patrick Downs is a 47 y.o. male Principal Problem:   Cellulitis Active Problems:   ESRD on dialysis (HCC)   GERD (gastroesophageal reflux disease)   Polyneuropathy, unspecified   Type 2 diabetes mellitus with peripheral neuropathy (HCC)   Ulcer of right foot (HCC)    Subjective: Says he is doing okay  Medications:   (feeding supplement) PROSource Plus  30 mL Oral TID BM   vitamin C  500 mg Oral BID   Chlorhexidine Gluconate Cloth  6 each Topical Daily   heparin  5,000 Units Subcutaneous Q8H   multivitamin  1 tablet Oral QHS   nutrition supplement (JUVEN)  1 packet Oral BID BM   sodium chloride flush  3 mL Intravenous Q12H   zinc sulfate  220 mg Oral Daily    Objective: Vital signs in last 24 hours: Patient Vitals for the past 24 hrs:  BP Temp Temp src Pulse Resp SpO2 Weight  01/26/23 0753 113/73 97.7 F (36.5 C) -- 74 19 92 % --  01/26/23 0506 117/83 97.6 F (36.4 C) Oral 66 -- 99 % --  01/25/23 2129 129/69 98.1 F (36.7 C) -- 88 18 94 % --  01/25/23 1631 94/67 97.8 F (36.6 C) Oral 66 16 -- --  01/25/23 1228 93/66 98.3 F (36.8 C) -- 85 17 98 % --  01/25/23 1206 -- -- -- -- -- -- 121.3 kg  01/25/23 1155 109/73 98.4 F (36.9 C) Oral 89 12 94 % --    Dialysis cath rt neck area  PHYSICAL EXAM:  General: Alert, cooperative, no distress, appears stated age.  Head: Normocephalic, without obvious abnormality, atraumatic. Eyes: Conjunctivae clear, anicteric sclerae. Pupils are equal ENT Nares normal. No drainage or sinus tenderness. Lips, mucosa, and tongue normal. No Thrush Neck: Supple, symmetrical, no adenopathy, thyroid: non tender no carotid bruit and no JVD. Back: No CVA tenderness. Lungs: Clear to auscultation bilaterally. No Wheezing or Rhonchi. No rales. Heart: Regular rate and rhythm, no murmur, rub or gallop. Abdomen: Soft, non-tender,not distended. Bowel sounds normal. No masses Extremities:   01/25/23  01/22/23     Skin: No rashes or lesions. Or bruising Lymph: Cervical, supraclavicular normal. Neurologic: contractures hands  Lab Results    Latest Ref Rng & Units 01/25/2023    4:50 AM 01/24/2023    4:58 AM 01/23/2023    7:49 AM  CBC  WBC 4.0 - 10.5 K/uL 4.8  4.4  5.9   Hemoglobin 13.0 - 17.0 g/dL 16.1  9.2  9.3   Hematocrit 39.0 - 52.0 % 32.5  28.0  27.9   Platelets 150 - 400 K/uL 151  130  134        Latest Ref Rng & Units 01/26/2023    5:03 AM 01/25/2023    4:50 AM 01/24/2023    4:58 AM  CMP  Glucose 70 - 99 mg/dL 096  045  409   BUN 6 - 20 mg/dL 71  84  60   Creatinine 0.61 - 1.24 mg/dL 81.19  14.78  29.56   Sodium 135 - 145 mmol/L 137  137  136   Potassium 3.5 - 5.1 mmol/L 5.1  5.7  4.6   Chloride 98 - 111 mmol/L 98  101  99   CO2 22 - 32 mmol/L 24  20  25    Calcium 8.9 - 10.3 mg/dL 6.8  6.9  6.6  Microbiology: Foot culture pending   Assessment/Plan: DM with DFI Rt gangrenous ulcer s/p debridement and  bone culture sent Pt currently on triple antibiotic- ( vanco/cefepime and flagyl).culture pending Pathology showed focal osteomyelitis Discussed with Dr.Fowler- will send him on IV antibiotic for 4 weeks As culture only anerobes so far- antibiotics changed to cefazolin 2 grams IV during dialysis and flagyl 500mg  PO BID    PAD s/p stent to Rt popliteal   ESRD on dialysis Multiple failed AVF Now has permacath   H/o GBS with neurological deficit  Discussed the management with the patient Will follow him as OP   OPAT Diagnosis: Foot infection Baseline Creatinine ESRD  Culture Result: anerobes  Allergies  Allergen Reactions   Hepatitis B Vaccine     Other reaction(s): Unknown   Ondansetron Other (See Comments)    Stomach pain    Minoxidil Other (See Comments)    "put fluid around my heart", PERICARDIAL EFFUSION   Morphine And Codeine Other (See Comments)    Aggressive    Omnipaque [Iohexol] Itching and Other (See Comments)     Rigors on one occasion, widespread itching on a separate occasion (resolved with Benadryl), tremors    OPAT Orders Discharge antibiotics: Cefazolin 2 grams on Tuesday 2grams on Thursday  3 grams on Saturday   AND PO FLAGYL 500MG  BID  Duration: 4 weeks End Date: 02/19/23   Labs weekly while on IV antibiotics: XX CBC with diff _XX_ CMP _XX_ CRP _XX_ ESR    Fax weekly lab results  promptly to [phone]  Clinic Follow Up Appt:02/15/23 at 10.45 AM   Call [phone] with any questions or critical lab value

## 2023-01-26 NOTE — Evaluation (Signed)
Occupational Therapy Evaluation Patient Details Name: Patrick Downs MRN: [id number] DOB: Jan 20, 1976 Today's Date: 01/26/2023   History of Present Illness Pt is a 47 y.o. male presenting to hospital 01/21/23 with c/o wound to R lateral foot (present for about a month).  Pt admitted with R lateral foot cellulitis.  S/p 01/23/23 " I&D with debridement of wound, bone biopsies, antibiotic bead application, and wound VAC" R LE.  10/2 s/p angio and re-vascularization.  PMH includes ESRD on HD, CHF, htn, GBS, peripheral neuropathy, B LE toe amputations.   Clinical Impression   OT evaluation session focused on problem-solving for ADL challenges and DME needs at home; discussed DME with pt (see below for details) and assisted in clarifying activity limitations (podiatry MD stating pt is to avoid driving if he has hope of healing his wound); pt acknowledging all education provided.        If plan is discharge home, recommend the following: A little help with walking and/or transfers;A little help with bathing/dressing/bathroom;Assistance with cooking/housework;Assist for transportation;Help with stairs or ramp for entrance    Functional Status Assessment  Patient has had a recent decline in their functional status and demonstrates the ability to make significant improvements in function in a reasonable and predictable amount of time.  Equipment Recommendations  BSC/3in1;Tub/shower bench    Recommendations for Other Services       Precautions / Restrictions Precautions Precautions: Fall Precaution Comments: B foot drop (AFO's); R IJ permcath Restrictions Weight Bearing Restrictions: Yes RLE Weight Bearing: Non weight bearing Other Position/Activity Restrictions: NWB R foot; post-op I&D R foot wound      Mobility Bed Mobility               General bed mobility comments: Not tested today; pt doing well with bed mobility with PT earlier today    Transfers                    General transfer comment: Not tested today; worked with PT earlier.      Balance                                           ADL either performed or assessed with clinical judgement   ADL Overall ADL's : Needs assistance/impaired Eating/Feeding: Independent;Bed level                                     General ADL Comments: Pt declining OOB mobility today; OT session focused on ADL and IADL education and DME recommendations. OT provided information about tub transfer bench and recommended covering of RLE for showering (plastic and secured with paper tape- OT provided pt with paper tape), LB dressing technique/clothing recommendations due to NWB status and wound vac on RLE; BSC recommendation and toilet transfers (pt initially declining BSC, but upon further description of it pt expressed that is what he thinks would be useful for toilet transfers). OT assisted pt in clarifying with podiatry about driving status (pt is to not drive and not put any weight/pressure on R foot); pt expressing understanding. Pt can have assist from friends/family with IADLs such as driving, groceries, laundry, garbage disposal.     Vision Patient Visual Report: No change from baseline       Perception  Praxis         Pertinent Vitals/Pain Pain Assessment Pain Assessment: 0-10 Pain Score: 1  Pain Location: R foot pain Pain Descriptors / Indicators: Sore, Tender Pain Intervention(s): Limited activity within patient's tolerance, Monitored during session     Extremity/Trunk Assessment Upper Extremity Assessment Upper Extremity Assessment: Overall WFL for tasks assessed (Pt stating that he knows he will need good UE strength for mobilizing NWB with RW; OT discussed doing exercises (pt has bands and weights at home), but also cautioned pt to conserve energy for daily activities/mobility with new NWB status.)   Lower Extremity Assessment Lower Extremity Assessment:  RLE deficits/detail RLE Deficits / Details: NWB RLE, wound vac, R foot wrapping. Pt states he is unable to feel either foot.   Cervical / Trunk Assessment Cervical / Trunk Assessment: Normal   Communication Communication Communication: No apparent difficulties Cueing Techniques: Verbal cues   Cognition Arousal: Alert Behavior During Therapy: WFL for tasks assessed/performed Overall Cognitive Status: Within Functional Limits for tasks assessed                                 General Comments: Pleasant.     General Comments  Pt on room air throughout session. Pt stating that he is familiar  with some of the OT teaching today since in 2000 he developed GBS and was w/c bound for approx 1 year. Able to use arms functionally throughout session. Pt states he has weights/theraband from prior therapy episodes; discussed using them, but also likelihood of getting adequate exercise through functional use of BIL UE during mobility with RW keeping RLE NWB status.    Exercises     Shoulder Instructions      Home Living Family/patient expects to be discharged to:: Private residence Living Arrangements: Alone Available Help at Discharge: Friend(s);Available PRN/intermittently Type of Home: House Home Access: Stairs to enter [employer] of Steps: 4 Entrance Stairs-Rails: Left Home Layout: One level     Bathroom Shower/Tub: Chief Strategy Officer: Standard     Home Equipment: Grab bars - tub/shower;Other (comment)   Additional Comments: Has used a tub transfer bench before and knows how to use it, but does not currently have one.      Prior Functioning/Environment Prior Level of Function : Independent/Modified Independent;Driving;Other (comment) (goes to HD 3x/week.)             Mobility Comments: Ambulatory with B LE AFO's (doesn't always use AFO's in home); (+) driving; independent ADLs Comments: (I) ADLs. Drives.        OT Problem List:  Decreased knowledge of use of DME or AE      OT Treatment/Interventions: Self-care/ADL training;Therapeutic exercise;DME and/or AE instruction;Therapeutic activities;Patient/family education    OT Goals(Current goals can be found in the care plan section) Acute Rehab OT Goals Patient Stated Goal: Go home and be independent OT Goal Formulation: With patient Time For Goal Achievement: 02/09/23 Potential to Achieve Goals: Good ADL Goals Additional ADL Goal #1: Patient will demonstrate full morning ADL routine while maintaining NWB RLE status using DME as needed by d/c.  OT Frequency: Min 1X/week    Co-evaluation              AM-PAC OT "6 Clicks" Daily Activity     Outcome Measure Help from another person eating meals?: None Help from another person taking care of personal grooming?: None Help from another person toileting, which  includes using toliet, bedpan, or urinal?: A Little Help from another person bathing (including washing, rinsing, drying)?: A Little Help from another person to put on and taking off regular upper body clothing?: None Help from another person to put on and taking off regular lower body clothing?: None 6 Click Score: 22   End of Session Nurse Communication: Mobility status  Activity Tolerance: Patient tolerated treatment well Patient left: in bed;with call bell/phone within reach  OT Visit Diagnosis: Other abnormalities of gait and mobility (R26.89)                Time: 1353-1435 OT Time Calculation (min): 42 min Charges:  OT General Charges $OT Visit: 1 Visit OT Evaluation $OT Eval Low Complexity: 1 Low OT Treatments $Self Care/Home Management : 23-37 mins  Linward Foster, MS, OTR/L  Alvester Morin 01/26/2023, 3:50 PM

## 2023-01-26 NOTE — Progress Notes (Signed)
[facility]  ROUNDING NOTE   Subjective:   Patient laying in bed Completed breakfast tray bedside Pain well managed Awaiting PT   Objective:  Vital signs in last 24 hours:  Temp:  [97.6 F (36.4 C)-98.1 F (36.7 C)] 97.7 F (36.5 C) (10/04 0753) Pulse Rate:  [66-88] 74 (10/04 0753) Resp:  [16-19] 19 (10/04 0753) BP: (94-129)/(67-83) 113/73 (10/04 0753) SpO2:  [92 %-99 %] 92 % (10/04 0753)  Weight change:  Filed Weights   01/23/23 1126 01/25/23 0810 01/25/23 1206  Weight: 122.5 kg 123.2 kg 121.3 kg    Intake/Output: I/O last 3 completed shifts: In: 240 [P.O.:240] Out: -    Intake/Output this shift:  Total I/O In: 360 [P.O.:360] Out: -   Physical Exam: General: No acute distress  Head: Normocephalic, atraumatic. Moist oral mucosal membranes  Lungs:  Clear to auscultation, normal effort  Heart: S1S2 no rubs  Abdomen:  Soft, nontender, bowel sounds present  Extremities: No peripheral edema.RLE NPWV  Neurologic: Awake, alert, following commands  Skin: No acute rash, right foot surgical dressing  Access: Right IJ PermCath    Basic Metabolic Panel: Recent Labs  Lab 01/21/23 0451 01/22/23 0501 01/23/23 0749 01/24/23 0458 01/25/23 0450 01/26/23 0503  NA 140 135 135 136 137 137  K 3.8 4.7 4.6 4.6 5.7* 5.1  CL 109 97* 97* 99 101 98  CO2 21* 23 22 25  20* 24  GLUCOSE 91 111* 90 102* 209* 162*  BUN 31* 56* 77* 60* 84* 71*  CREATININE 7.66* 13.12* 15.15* 12.31* 14.63* 10.57*  CALCIUM 5.2* 6.7* 6.5* 6.6* 6.9* 6.8*  MG 1.6*  --   --   --   --   --   PHOS 2.6  --   --   --   --  4.4    Liver Function Tests: Recent Labs  Lab 01/21/23 0451 01/22/23 0501 01/26/23 0503  AST 26 12*  --   ALT 10 14  --   ALKPHOS 34* 47  --   BILITOT 1.5* 0.9  --   PROT 5.6* 6.7  --   ALBUMIN 2.7* 3.1* 3.1*   No results for input(s): "LIPASE", "AMYLASE" in the last 168 hours. No results for input(s): "AMMONIA" in the last 168 hours.  CBC: Recent Labs  Lab  01/21/23 0451 01/22/23 0501 01/23/23 0749 01/24/23 0458 01/25/23 0450  WBC 8.1 5.5 5.9 4.4 4.8  NEUTROABS 6.2  --  4.0 2.8 4.4  HGB 10.2* 10.0* 9.3* 9.2* 10.7*  HCT 32.4* 30.7* 27.9* 28.0* 32.5*  MCV 93.4 90.6 89.4 89.2 89.0  PLT 150 137* 134* 130* 151    Cardiac Enzymes: No results for input(s): "CKTOTAL", "CKMB", "CKMBINDEX", "TROPONINI" in the last 168 hours.  BNP: Invalid input(s): "POCBNP"  CBG: Recent Labs  Lab 01/23/23 1612 01/23/23 2247 01/24/23 0830 01/24/23 1118 01/24/23 2103  GLUCAP 185* 105* 85 93 216*    Microbiology: Results for orders placed or performed during the hospital encounter of 01/21/23  Blood Culture (routine x 2)     Status: None   Collection Time: 01/21/23  5:08 AM   Specimen: BLOOD  Result Value Ref Range Status   Specimen Description BLOOD BLOOD RIGHT ARM  Final   Special Requests   Final    BOTTLES DRAWN AEROBIC AND ANAEROBIC Blood Culture results may not be optimal due to an inadequate volume of blood received in culture bottles   Culture   Final    NO GROWTH 5 DAYS Performed at  [facility], [address] [place], [place] [address]    Report Status 01/26/2023 FINAL  Final  Blood Culture (routine x 2)     Status: None   Collection Time: 01/21/23  5:31 AM   Specimen: BLOOD  Result Value Ref Range Status   Specimen Description BLOOD BLOOD LEFT ARM  Final   Special Requests   Final    BOTTLES DRAWN AEROBIC AND ANAEROBIC Blood Culture results may not be optimal due to an inadequate volume of blood received in culture bottles   Culture   Final    NO GROWTH 5 DAYS Performed at [facility], [address] [place], [place] [address]    Report Status 01/26/2023 FINAL  Final  Aerobic/Anaerobic Culture w Gram Stain (surgical/deep wound)     Status: None (Preliminary result)   Collection Time: 01/23/23  1:13 PM   Specimen: Wound; Body Fluid  Result Value Ref Range Status   Specimen Description   Final     WOUND Performed at [facility], [address] [place], [place] [address]    Special Requests   Final    RIGHT 5TH METATARSEL ULCER Performed at [facility], [address] [place], [place] [address]    Gram Stain   Final    NO WBC SEEN NO ORGANISMS SEEN Performed at [facility], [address], [place], [place] [address]    Culture   Final    CULTURE REINCUBATED FOR BETTER GROWTH NO ANAEROBES ISOLATED; CULTURE IN PROGRESS FOR 5 DAYS    Report Status PENDING  Incomplete  Aerobic/Anaerobic Culture w Gram Stain (surgical/deep wound)     Status: None (Preliminary result)   Collection Time: 01/23/23  1:16 PM   Specimen: Wound  Result Value Ref Range Status   Specimen Description   Final    WOUND Performed at [facility], [address] [place], [place] [address]    Special Requests   Final    RIGHT 5TH MATATARSEL BONE Performed at [facility], [address], [place], [place] [address]    Gram Stain NO WBC SEEN NO ORGANISMS SEEN   Final   Culture   Final    NO GROWTH 3 DAYS NO ANAEROBES ISOLATED; CULTURE IN PROGRESS FOR 5 DAYS Performed at [facility], [address], [place], [place] [address]    Report Status PENDING  Incomplete    Coagulation Studies: No results for input(s): "LABPROT", "INR" in the last 72 hours.   Urinalysis: No results for input(s): "COLORURINE", "LABSPEC", "PHURINE", "GLUCOSEU", "HGBUR", "BILIRUBINUR", "KETONESUR", "PROTEINUR", "UROBILINOGEN", "NITRITE", "LEUKOCYTESUR" in the last 72 hours.  Invalid input(s): "APPERANCEUR"    Imaging: No results found.   Medications:    sodium chloride     ceFEPime (MAXIPIME) IV 1 g (01/25/23 1733)   metronidazole 500 mg (01/26/23 0950)   sodium chloride 250 mL/hr at 01/24/23 1453   vancomycin Stopped (01/25/23 1236)    (feeding supplement) PROSource Plus  30 mL Oral TID BM   vitamin C  500 mg Oral BID   Chlorhexidine Gluconate Cloth  6 each  Topical Daily   heparin  5,000 Units Subcutaneous Q8H   multivitamin  1 tablet Oral QHS   nutrition supplement (JUVEN)  1 packet Oral BID BM   sodium chloride flush  3 mL Intravenous Q12H   zinc sulfate  220 mg Oral Daily   sodium chloride, acetaminophen, hydrOXYzine, iodixanol, oxyCODONE-acetaminophen **AND** oxyCODONE, sodium chloride flush  Assessment/ Plan:  47 y.o. male ESRD on HD TTS, CHF, history of Guillain-Barr syndrome, hypertension, anemia of chronic kidney disease, secondary hyperparathyroidism, history of right foot toe amputation who presents with ulceration on lateral aspect of right foot.  [facility]  1.  ESRD on HD TTS.  Dialysis received yesterday, UF 2L achieved. Next treatment scheduled for Saturday.   2.  Anemia of chronic kidney disease.   Lab Results  Component Value Date   HGB 10.7 (L) 01/25/2023   Patient receives Mircera as outpatient.  Hemoglobin  stable  3.  Secondary hyperparathyroidism.  Calcium 6.8. Correctling slowly with dialysis. Patient prescribed auryxia with meals outpatient   Will place on 3 calcium bath for dialysis  4.  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  Currently on vancomycin and metronidazole.  Imaging suspicious for osteomyelitis.  Podiatry performed debridement on 01/23/23 with bone biopsy and placement on wound vac. Vascular performed angiogram on 01/24/23 with stent placement. Awaiting culture for treatment plan    LOS: 5   10/4/20242:05 PM

## 2023-01-26 NOTE — Care Management Important Message (Signed)
Important Message  Patient Details  Name: Patrick Downs MRN: [id number] Date of Birth: 12-07-1975   Important Message Given:  Yes - Medicare IM     Johnell Comings 01/26/2023, 10:47 AM

## 2023-01-26 NOTE — TOC Progression Note (Signed)
Transition of Care [facility]) - Progression Note    Patient Details  Name: Patrick Downs MRN: [id number] Date of Birth: [birth year]  Transition of Care [facility]) CM/SW Contact  Allena Katz, LCSW Phone Number: 01/26/2023, 11:08 AM  Clinical Narrative:   ID consulting with patient. Pt going to need IV antibiotics. ID waiting on final cultures.     Expected Discharge Plan: Home w Home Health Services Barriers to Discharge: Continued Medical Work up  Expected Discharge Plan and Services                                   HH Arranged: RN           Social Determinants of Health (SDOH) Interventions SDOH Screenings   Food Insecurity: No Food Insecurity (09/11/2022)   Received from [facility], [facility]  Transportation Needs: No Transportation Needs (09/11/2022)   Received from [facility], [facility]  Depression [phone]): Low Risk  (06/27/2021)  Financial Resource Strain: Low Risk  (09/11/2022)   Received from [facility], [facility]  Social Connections: Unknown (09/11/2022)   Received from [facility], [facility]  Stress: No Stress Concern Present (09/11/2022)   Received from [facility], [facility]  Tobacco Use: Low Risk  (12/22/2022)    Readmission Risk Interventions    01/22/2023   10:18 AM  Readmission Risk Prevention Plan  Transportation Screening Complete  PCP or Specialist Appt within 3-5 Days Complete  HRI or Home Care Consult Complete  Social Work Consult for Recovery Care Planning/Counseling Patient refused  Palliative Care Screening Not Applicable  Medication Review Oceanographer) Complete

## 2023-01-27 DIAGNOSIS — Z992 Dependence on renal dialysis: Secondary | ICD-10-CM | POA: Diagnosis not present

## 2023-01-27 DIAGNOSIS — E1142 Type 2 diabetes mellitus with diabetic polyneuropathy: Secondary | ICD-10-CM | POA: Diagnosis not present

## 2023-01-27 DIAGNOSIS — N186 End stage renal disease: Secondary | ICD-10-CM | POA: Diagnosis not present

## 2023-01-27 DIAGNOSIS — G629 Polyneuropathy, unspecified: Secondary | ICD-10-CM | POA: Diagnosis not present

## 2023-01-27 DIAGNOSIS — M869 Osteomyelitis, unspecified: Secondary | ICD-10-CM

## 2023-01-27 LAB — RENAL FUNCTION PANEL
Albumin: 3.3 g/dL — ABNORMAL LOW (ref 3.5–5.0)
Anion gap: 19 — ABNORMAL HIGH (ref 5–15)
BUN: 94 mg/dL — ABNORMAL HIGH (ref 6–20)
CO2: 18 mmol/L — ABNORMAL LOW (ref 22–32)
Calcium: 6.2 mg/dL — CL (ref 8.9–10.3)
Chloride: 100 mmol/L (ref 98–111)
Creatinine, Ser: 12.92 mg/dL — ABNORMAL HIGH (ref 0.61–1.24)
GFR, Estimated: 4 mL/min — ABNORMAL LOW (ref >60)
Glucose, Bld: 87 mg/dL (ref 70–99)
Phosphorus: 7.9 mg/dL — ABNORMAL HIGH (ref 2.5–4.6)
Potassium: 5.3 mmol/L — ABNORMAL HIGH (ref 3.5–5.1)
Sodium: 137 mmol/L (ref 135–145)

## 2023-01-27 MED ORDER — SODIUM ZIRCONIUM CYCLOSILICATE 5 G PO PACK
5.0000 g | PACK | Freq: Once | ORAL | Status: DC
  Filled 2023-01-27: qty 1

## 2023-01-27 MED ORDER — HEPARIN SODIUM (PORCINE) 1000 UNIT/ML DIALYSIS: 1000.0000 [IU] | INTRAMUSCULAR | Status: DC | PRN

## 2023-01-27 MED ORDER — ALTEPLASE 2 MG IJ SOLR: 2.0000 mg | Freq: Once | INTRAMUSCULAR | Status: DC | PRN

## 2023-01-27 NOTE — Progress Notes (Signed)
Pt transferred from Room 120 to the Hemodialysis Unit via hospital bed by the hospital orderly

## 2023-01-27 NOTE — Progress Notes (Signed)
Pt transferred via hospital bed from hemodialysis to Room 120 by the hospital orderly

## 2023-01-27 NOTE — Plan of Care (Signed)
  Problem: Education: Goal: Ability to describe self-care measures that may prevent or decrease complications (Diabetes Survival Skills Education) will improve Outcome: Progressing Goal: Individualized Educational Video(s) Outcome: Progressing   Problem: Coping: Goal: Ability to adjust to condition or change in health will improve Outcome: Progressing   Problem: Fluid Volume: Goal: Ability to maintain a balanced intake and output will improve Outcome: Progressing   Problem: Health Behavior/Discharge Planning: Goal: Ability to identify and utilize available resources and services will improve Outcome: Progressing Goal: Ability to manage health-related needs will improve Outcome: Progressing   Problem: Metabolic: Goal: Ability to maintain appropriate glucose levels will improve Outcome: Progressing   Problem: Nutritional: Goal: Maintenance of adequate nutrition will improve Outcome: Progressing Goal: Progress toward achieving an optimal weight will improve Outcome: Progressing   Problem: Skin Integrity: Goal: Risk for impaired skin integrity will decrease Outcome: Progressing   Problem: Tissue Perfusion: Goal: Adequacy of tissue perfusion will improve Outcome: Progressing   Problem: Education: Goal: Knowledge of General Education information will improve Description: Including pain rating scale, medication(s)/side effects and non-pharmacologic comfort measures Outcome: Progressing   Problem: Health Behavior/Discharge Planning: Goal: Ability to manage health-related needs will improve Outcome: Progressing   Problem: Clinical Measurements: Goal: Ability to maintain clinical measurements within normal limits will improve Outcome: Progressing Goal: Will remain free from infection Outcome: Progressing Goal: Diagnostic test results will improve Outcome: Progressing   Problem: Activity: Goal: Risk for activity intolerance will decrease Outcome: Progressing   Problem:  Nutrition: Goal: Adequate nutrition will be maintained Outcome: Progressing   Problem: Coping: Goal: Level of anxiety will decrease Outcome: Progressing   Problem: Elimination: Goal: Will not experience complications related to bowel motility Outcome: Progressing Goal: Will not experience complications related to urinary retention Outcome: Progressing   Problem: Pain Managment: Goal: General experience of comfort will improve Outcome: Progressing   Problem: Safety: Goal: Ability to remain free from injury will improve Outcome: Progressing   Problem: Skin Integrity: Goal: Risk for impaired skin integrity will decrease Outcome: Progressing

## 2023-01-27 NOTE — Progress Notes (Signed)
Manuela Schwartz NP made aware of ca of 6.2 by secure chat

## 2023-01-27 NOTE — Progress Notes (Signed)
  Received patient in bed to unit.    Informed consent signed and in chart.    TX duration: 3.5 hours      Transported back to floor  Hand-off given to patient's nurse.    Access used: R jugular  Access issues: none    Total UF removed: 2L Medication(s) given: none Post HD VS: 107/64 Post HD weight: 122.3kg      Olin Pia, LPN Kidney Dialysis Unit

## 2023-01-27 NOTE — Progress Notes (Signed)
[facility]  ROUNDING NOTE   Subjective:   Patient seen and evaluated during dialysis   HEMODIALYSIS FLOWSHEET:  Blood Flow Rate (mL/min): 399 mL/min Arterial Pressure (mmHg): -190.29 mmHg Venous Pressure (mmHg): 174.13 mmHg TMP (mmHg): 9.49 mmHg Ultrafiltration Rate (mL/min): 767 mL/min Dialysate Flow Rate (mL/min): 299 ml/min Dialysis Fluid Bolus: Normal Saline  Tolerating treatment well Denies pain  NPWV in place  Objective:  Vital signs in last 24 hours:  Temp:  [97.5 F (36.4 C)-97.6 F (36.4 C)] 97.6 F (36.4 C) (10/05 1200) Pulse Rate:  [61-76] 71 (10/05 1200) Resp:  [9-21] 21 (10/05 1200) BP: (96-135)/(50-79) 107/64 (10/05 1200) SpO2:  [67 %-100 %] 99 % (10/05 1200) Weight:  [124.8 kg] 124.8 kg (10/05 0811)  Weight change:  Filed Weights   01/25/23 0810 01/25/23 1206 01/27/23 0811  Weight: 123.2 kg 121.3 kg 124.8 kg    Intake/Output: I/O last 3 completed shifts: In: 1183.1 [P.O.:384; IV Piggyback:799.1] Out: -    Intake/Output this shift:  No intake/output data recorded.  Physical Exam: General: No acute distress  Head: Normocephalic, atraumatic. Moist oral mucosal membranes  Lungs:  Clear to auscultation, normal effort  Heart: S1S2 no rubs  Abdomen:  Soft, nontender, bowel sounds present  Extremities: No peripheral edema.RLE NPWV  Neurologic: Awake, alert, following commands  Skin: No acute rash, right foot surgical dressing  Access: Right IJ PermCath    Basic Metabolic Panel: Recent Labs  Lab 01/21/23 0451 01/22/23 0501 01/23/23 0749 01/24/23 0458 01/25/23 0450 01/26/23 0503 01/27/23 0548  NA 140   < > 135 136 137 137 137  K 3.8   < > 4.6 4.6 5.7* 5.1 5.3*  CL 109   < > 97* 99 101 98 100  CO2 21*   < > 22 25 20* 24 18*  GLUCOSE 91   < > 90 102* 209* 162* 87  BUN 31*   < > 77* 60* 84* 71* 94*  CREATININE 7.66*   < > 15.15* 12.31* 14.63* 10.57* 12.92*  CALCIUM 5.2*   < > 6.5* 6.6* 6.9* 6.8* 6.2*  MG 1.6*  --   --   --    --   --   --   PHOS 2.6  --   --   --   --  4.4 7.9*   < > = values in this interval not displayed.    Liver Function Tests: Recent Labs  Lab 01/21/23 0451 01/22/23 0501 01/26/23 0503 01/27/23 0548  AST 26 12*  --   --   ALT 10 14  --   --   ALKPHOS 34* 47  --   --   BILITOT 1.5* 0.9  --   --   PROT 5.6* 6.7  --   --   ALBUMIN 2.7* 3.1* 3.1* 3.3*   No results for input(s): "LIPASE", "AMYLASE" in the last 168 hours. No results for input(s): "AMMONIA" in the last 168 hours.  CBC: Recent Labs  Lab 01/21/23 0451 01/22/23 0501 01/23/23 0749 01/24/23 0458 01/25/23 0450  WBC 8.1 5.5 5.9 4.4 4.8  NEUTROABS 6.2  --  4.0 2.8 4.4  HGB 10.2* 10.0* 9.3* 9.2* 10.7*  HCT 32.4* 30.7* 27.9* 28.0* 32.5*  MCV 93.4 90.6 89.4 89.2 89.0  PLT 150 137* 134* 130* 151    Cardiac Enzymes: No results for input(s): "CKTOTAL", "CKMB", "CKMBINDEX", "TROPONINI" in the last 168 hours.  BNP: Invalid input(s): "POCBNP"  CBG: Recent Labs  Lab 01/23/23 1612 01/23/23 2247 01/24/23  0830 01/24/23 1118 01/24/23 2103  GLUCAP 185* 105* 85 93 216*    Microbiology: Results for orders placed or performed during the hospital encounter of 01/21/23  Blood Culture (routine x 2)     Status: None   Collection Time: 01/21/23  5:08 AM   Specimen: BLOOD  Result Value Ref Range Status   Specimen Description BLOOD BLOOD RIGHT ARM  Final   Special Requests   Final    BOTTLES DRAWN AEROBIC AND ANAEROBIC Blood Culture results may not be optimal due to an inadequate volume of blood received in culture bottles   Culture   Final    NO GROWTH 5 DAYS Performed at [facility], [address] [place], [place] [address]    Report Status 01/26/2023 FINAL  Final  Blood Culture (routine x 2)     Status: None   Collection Time: 01/21/23  5:31 AM   Specimen: BLOOD  Result Value Ref Range Status   Specimen Description BLOOD BLOOD LEFT ARM  Final   Special Requests   Final    BOTTLES DRAWN AEROBIC AND  ANAEROBIC Blood Culture results may not be optimal due to an inadequate volume of blood received in culture bottles   Culture   Final    NO GROWTH 5 DAYS Performed at [facility], [address], [place], [place] [address]    Report Status 01/26/2023 FINAL  Final  Aerobic/Anaerobic Culture w Gram Stain (surgical/deep wound)     Status: None (Preliminary result)   Collection Time: 01/23/23  1:13 PM   Specimen: Wound; Body Fluid  Result Value Ref Range Status   Specimen Description   Final    WOUND Performed at [facility], [address] [place], [place] [address]    Special Requests   Final    RIGHT 5TH METATARSEL ULCER Performed at [facility], [address] [place], [place] [address]    Gram Stain   Final    NO WBC SEEN NO ORGANISMS SEEN Performed at [facility], [address] [place], [place] [address]    Culture   Final    RARE ERYSIPELOTHRIX RHUSIOPATHIAE Standardized susceptibility testing for this organism is not available. NO ANAEROBES ISOLATED; CULTURE IN PROGRESS FOR 5 DAYS    Report Status PENDING  Incomplete  Aerobic/Anaerobic Culture w Gram Stain (surgical/deep wound)     Status: None (Preliminary result)   Collection Time: 01/23/23  1:16 PM   Specimen: Wound  Result Value Ref Range Status   Specimen Description   Final    WOUND Performed at [facility], [address] [place], [place] [address]    Special Requests   Final    RIGHT 5TH MATATARSEL BONE Performed at [facility], [address] [place], [place] [address]    Gram Stain NO WBC SEEN NO ORGANISMS SEEN   Final   Culture   Final    NO GROWTH 4 DAYS NO GROWTH 3 DAYS Performed at [facility], [address] [place], [place] [address]    Report Status PENDING  Incomplete    Coagulation Studies: No results for input(s): "LABPROT", "INR" in the last 72 hours.   Urinalysis: No results for input(s): "COLORURINE",  "LABSPEC", "PHURINE", "GLUCOSEU", "HGBUR", "BILIRUBINUR", "KETONESUR", "PROTEINUR", "UROBILINOGEN", "NITRITE", "LEUKOCYTESUR" in the last 72 hours.  Invalid input(s): "APPERANCEUR"    Imaging: No results found.   Medications:    sodium chloride      ceFAZolin (ANCEF)  IV Stopped (01/26/23 1641)   metronidazole Stopped (01/26/23 2233)   sodium chloride 250 mL/hr at 01/27/23 0556    (feeding supplement) PROSource Plus  30 mL Oral TID BM   vitamin C  500 mg Oral BID   Chlorhexidine Gluconate Cloth  6 each Topical Daily   heparin  5,000 Units Subcutaneous Q8H   multivitamin  1 tablet Oral QHS   nutrition supplement (JUVEN)  1 packet Oral BID BM   sodium chloride flush  3 mL Intravenous Q12H   sodium zirconium cyclosilicate  5 g Oral Once   zinc sulfate  220 mg Oral Daily   sodium chloride, acetaminophen, hydrOXYzine, iodixanol, oxyCODONE-acetaminophen **AND** oxyCODONE, sodium chloride flush  Assessment/ Plan:  47 y.o. male ESRD on HD TTS, CHF, history of Guillain-Barr syndrome, hypertension, anemia of chronic kidney disease, secondary hyperparathyroidism, history of right foot toe amputation who presents with ulceration on lateral aspect of right foot.  [facility]  1.  ESRD on HD TTS.  Receiving dialysis, UF goal 2L as tolerated. Next treatment scheduled for Tuesday  2.  Anemia of chronic kidney disease.   Lab Results  Component Value Date   HGB 10.7 (L) 01/25/2023   Patient receives Mircera as outpatient.  Hemoglobin within desired range.  3.  Secondary hyperparathyroidism.  Calcium 6.2.  Patient prescribed auryxia with meals outpatient   Will correct with dialysis today. 3 calcium bath used to correct.   4.  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  Currently on cefazolin and metronidazole.  Imaging suspicious for osteomyelitis.  Podiatry performed debridement on 01/23/23 with bone biopsy and placement on wound vac. Vascular performed angiogram on  01/24/23 with stent placement. Awaiting full culture report for treatment plan    LOS: 6   10/5/202412:04 PM

## 2023-01-27 NOTE — Progress Notes (Addendum)
Progress Note   Patient: Patrick Downs DOB: [birth year] DOA: 01/21/2023     6 DOS: the patient was seen and examined on 01/27/2023      Subjective:  Patient seen after dialysis today.  He reports overall feeling okay.  States he is working on arrangements for help when he goes home, and someone to drive him to dialysis as he usually drives himself and now unable given NWB status on right foot.  He denies acute complaints.      Brief hospital course: From HPI "Patrick Downs is a 47 y.o. male with medical history significant of ESRD on hemodialysis Tuesday Thursday Saturday, GERD, hypertension, gout, type 2 diabetes, peripheral neuropathy, prior toe amputation presenting with right lower extremity cellulitis.  Patient reports ulcer formation on the right lateral foot.  Has had worsening drainage over multiple days.  Mom malaise.  Patient does wear a brace on the right foot.  Has been unable to wear brace secondary to swelling.  Noted malodorous drainage from wound.  No fevers or chills.  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  Non-smoker.  Patient denies any alcohol use.  Has been compliant with hemodialysis regimen. Presented to the ER Tmax 99.4, blood pressures 90s to 110s over 50s to 70s.  Satting well on room air.  White count 8.1, hemoglobin 10.2, platelets 150.  Creatinine 7.7, T. bili 1.5.  Right foot plain films with superficial soft tissue wound with subcutaneous gas adjacent to the base of the fifth metatarsal bone.  "   Assessment and Plan:   Cellulitis of the right fourth toe with underlying osteomyelitis Right lateral foot cellulitis in setting of end-stage renal disease with prior amputations in the past MRI of the right foot showing soft tissue ulceration lateral to the base of the fifth metatarsal with underlying marrow changes at the base of the fifth metatarsal suspicious for osteomyelitis.  No abscess detected. 10/1 - podiatry did excisional  debridement and bone biopsy, wound vac placed - See Op Note 10/2 - vascular did RLE angio & re-vascularization - see Op Note --Initially on broad spectrum - Vanc/Cefepime/Flagyl --Consults -- Infectious disease, Vascular surgery, Podiatry --Now on Ancef - to be given at dialysis TTS - end date 02/19/23 --Wound care RN following --Wound vac changes Monday/Thursday while admitted.   --At d/c, wound vac changes 3 times / week --Non-weight bearing on RIGHT foot --PT evaluation - HH recommended --Follow up with Podiatry in 2 weeks     Type 2 diabetes mellitus with peripheral neuropathy (HCC) Well controlled - A1c 6.0% Pt refusing CBG's and insulin per nursing --D/C CBG's and sliding scale insulin for now --Resume if fasting glucose on BMP's is > 180   ESRD on dialysis (HCC) Baseline ESRD on HD TTS  No reported missed episodes of HD  --Nephrology following for dialysis --Monitor renal function panels     Polyneuropathy, unspecified Continue Lyrica   GERD (gastroesophageal reflux disease) PPI      Advance Care Planning:   Code Status: Full Code    Consults: nephrology, podiatry, vascular surgery   Family Communication: None at bedside      Disposition -- home with [facility] likely Monday 10/7.   Pt arranging in home assistance and help with transportation to his outpatient dialysis given R foot non-weight-bearing he is unable to drive.    Physical Exam: General exam: awake, alert, no acute distress Respiratory system: on room air, normal respiratory effort. Cardiovascular system: RRR,  no pedal edema.   Central nervous system: A&O x3. no gross focal neurologic deficits, normal speech Extremities: right foot wound vac present, no edema Skin: dry, intact, normal temperature Psychiatry: normal mood, congruent affect, judgement and insight appear normal        Data Reviewed: I have reviewed patient's lab results as shown below as well as vascular surgeon and podiatrist  documentation    Latest Ref Rng & Units 01/25/2023    4:50 AM 01/24/2023    4:58 AM 01/23/2023    7:49 AM  CBC  WBC 4.0 - 10.5 K/uL 4.8  4.4  5.9   Hemoglobin 13.0 - 17.0 g/dL 44.0  9.2  9.3   Hematocrit 39.0 - 52.0 % 32.5  28.0  27.9   Platelets 150 - 400 K/uL 151  130  134        Latest Ref Rng & Units 01/27/2023    5:48 AM 01/26/2023    5:03 AM 01/25/2023    4:50 AM  BMP  Glucose 70 - 99 mg/dL 87  102  725   BUN 6 - 20 mg/dL 94  71  84   Creatinine 0.61 - 1.24 mg/dL 36.64  40.34  74.25   Sodium 135 - 145 mmol/L 137  137  137   Potassium 3.5 - 5.1 mmol/L 5.3  5.1  5.7   Chloride 98 - 111 mmol/L 100  98  101   CO2 22 - 32 mmol/L 18  24  20    Calcium 8.9 - 10.3 mg/dL 6.2  6.8  6.9      Vitals:   01/27/23 1130 01/27/23 1200 01/27/23 1210 01/27/23 1302  BP: 96/79 107/64  110/86  Pulse: 69 71  73  Resp: 10 (!) 21  18  Temp:  97.6 F (36.4 C)  97.9 F (36.6 C)  TempSrc:  Oral    SpO2: 100% 99%  100%  Weight:   122.3 kg   Height:        Author: Pennie Banter, DO 01/27/2023 2:50 PM  For on call review [email or web address].

## 2023-01-27 NOTE — Plan of Care (Signed)
  Problem: Education: Goal: Ability to describe self-care measures that may prevent or decrease complications (Diabetes Survival Skills Education) will improve Outcome: Progressing   Problem: Coping: Goal: Ability to adjust to condition or change in health will improve Outcome: Progressing   Problem: Tissue Perfusion: Goal: Adequacy of tissue perfusion will improve Outcome: Progressing

## 2023-01-28 DIAGNOSIS — M869 Osteomyelitis, unspecified: Secondary | ICD-10-CM | POA: Diagnosis not present

## 2023-01-28 DIAGNOSIS — Z992 Dependence on renal dialysis: Secondary | ICD-10-CM | POA: Diagnosis not present

## 2023-01-28 DIAGNOSIS — E1142 Type 2 diabetes mellitus with diabetic polyneuropathy: Secondary | ICD-10-CM | POA: Diagnosis not present

## 2023-01-28 DIAGNOSIS — N186 End stage renal disease: Secondary | ICD-10-CM | POA: Diagnosis not present

## 2023-01-28 LAB — RENAL FUNCTION PANEL
Albumin: 3.3 g/dL — ABNORMAL LOW (ref 3.5–5.0)
Anion gap: 20 — ABNORMAL HIGH (ref 5–15)
BUN: 72 mg/dL — ABNORMAL HIGH (ref 6–20)
CO2: 17 mmol/L — ABNORMAL LOW (ref 22–32)
Calcium: 6.8 mg/dL — ABNORMAL LOW (ref 8.9–10.3)
Chloride: 100 mmol/L (ref 98–111)
Creatinine, Ser: 10.26 mg/dL — ABNORMAL HIGH (ref 0.61–1.24)
GFR, Estimated: 6 mL/min — ABNORMAL LOW (ref >60)
Glucose, Bld: 92 mg/dL (ref 70–99)
Phosphorus: 6.5 mg/dL — ABNORMAL HIGH (ref 2.5–4.6)
Potassium: 4.6 mmol/L (ref 3.5–5.1)
Sodium: 137 mmol/L (ref 135–145)

## 2023-01-28 NOTE — Progress Notes (Signed)
Progress Note   Patient: Patrick Downs GMW:[id number] DOB: 13-Jun-1975 DOA: 01/21/2023     8 DOS: the patient was seen and examined on 01/29/2023      Subjective:  Patient sleeping but woke up to voice. Denies complaints this AM.  Working on help at home and should be ready for d/c tomorrow.     Brief hospital course: From HPI "Patrick Downs is a 47 y.o. male with medical history significant of ESRD on hemodialysis Tuesday Thursday Saturday, GERD, hypertension, gout, type 2 diabetes, peripheral neuropathy, prior toe amputation presenting with right lower extremity cellulitis.  Patient reports ulcer formation on the right lateral foot.  Has had worsening drainage over multiple days.  Mom malaise.  Patient does wear a brace on the right foot.  Has been unable to wear brace secondary to swelling.  Noted malodorous drainage from wound.  No fevers or chills.  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  Non-smoker.  Patient denies any alcohol use.  Has been compliant with hemodialysis regimen. Presented to the ER Tmax 99.4, blood pressures 90s to 110s over 50s to 70s.  Satting well on room air.  White count 8.1, hemoglobin 10.2, platelets 150.  Creatinine 7.7, T. bili 1.5.  Right foot plain films with superficial soft tissue wound with subcutaneous gas adjacent to the base of the fifth metatarsal bone.  "   Assessment and Plan:   Cellulitis of the right fourth toe with underlying osteomyelitis Right lateral foot cellulitis in setting of end-stage renal disease with prior amputations in the past MRI of the right foot showing soft tissue ulceration lateral to the base of the fifth metatarsal with underlying marrow changes at the base of the fifth metatarsal suspicious for osteomyelitis.  No abscess detected. 10/1 - podiatry did excisional debridement and bone biopsy, wound vac placed - See Op Note 10/2 - vascular did RLE angio & re-vascularization - see Op Note --Initially on broad  spectrum - Vanc/Cefepime/Flagyl --Consults -- Infectious disease, Vascular surgery, Podiatry --Now on Ancef - to be given at dialysis TTS - end date 02/19/23 --Wound care RN following --Wound vac changes Monday/Thursday while admitted.   --At d/c, wound vac changes 3 times / week --Non-weight bearing on RIGHT foot --PT evaluation - HH recommended --Follow up with Podiatry in 2 weeks     Type 2 diabetes mellitus with peripheral neuropathy (HCC) Well controlled - A1c 6.0% Pt refusing CBG's and insulin per nursing --D/C CBG's and sliding scale insulin for now --Resume if fasting glucose on BMP's is > 180   ESRD on dialysis (HCC) Baseline ESRD on HD TTS  No reported missed episodes of HD  --Nephrology following for dialysis --Monitor renal function panels     Polyneuropathy, unspecified Continue Lyrica   GERD (gastroesophageal reflux disease) PPI    Hyperkalemia - due to ESRD Mgmt per nephrology with dialysis    Advance Care Planning:   Code Status: Full Code    Consults: nephrology, podiatry, vascular surgery   Family Communication: None at bedside. Pt able to update.      Disposition -- home with [facility] likely Monday 10/7.   Pt arranging in home assistance and help with transportation to his outpatient dialysis given R foot non-weight-bearing he is unable to drive.    Physical Exam: General exam: sleeping wakes to voice, no acute distress Respiratory system: on room air, normal respiratory effort. Lungs clear no wheezes or rhonchi Cardiovascular system: RRR, no pedal edema.  +  S1/S2 Central nervous system: A&O x3. no gross focal neurologic deficits, normal speech Extremities: right foot wound vac present, no edema Skin: dry, intact, normal temperature Psychiatry: normal mood, congruent affect, judgement and insight appear normal        Data Reviewed:  Notable labs --- bicarb 17, BUN 72, Cr 10.26, cva 6.8, albumin 3.3, gap 20, phos 6.5   Vitals:   01/28/23 1638  01/28/23 2005 01/29/23 0451 01/29/23 0751  BP: 104/79 119/82 132/83 116/68  Pulse: 66 78 71 66  Resp: 15  18 19   Temp: 98.7 F (37.1 C) (!) 97.5 F (36.4 C) 97.7 F (36.5 C) (!) 97.4 F (36.3 C)  TempSrc:  Oral Oral   SpO2: 100% 95% 99% 94%  Weight:      Height:        Author: Pennie Banter, DO 01/29/2023 4:42 PM  For on call review [email or web address].

## 2023-01-28 NOTE — Plan of Care (Signed)
  Problem: Education: Goal: Ability to describe self-care measures that may prevent or decrease complications (Diabetes Survival Skills Education) will improve Outcome: Progressing Goal: Individualized Educational Video(s) Outcome: Progressing   Problem: Coping: Goal: Ability to adjust to condition or change in health will improve Outcome: Progressing   Problem: Fluid Volume: Goal: Ability to maintain a balanced intake and output will improve Outcome: Progressing   Problem: Health Behavior/Discharge Planning: Goal: Ability to manage health-related needs will improve Outcome: Progressing   Problem: Metabolic: Goal: Ability to maintain appropriate glucose levels will improve Outcome: Progressing   Problem: Nutritional: Goal: Maintenance of adequate nutrition will improve Outcome: Progressing Goal: Progress toward achieving an optimal weight will improve Outcome: Progressing

## 2023-01-28 NOTE — Plan of Care (Signed)
  Problem: Education: Goal: Ability to describe self-care measures that may prevent or decrease complications (Diabetes Survival Skills Education) will improve Outcome: Progressing Goal: Individualized Educational Video(s) Outcome: Progressing   Problem: Coping: Goal: Ability to adjust to condition or change in health will improve Outcome: Progressing   Problem: Fluid Volume: Goal: Ability to maintain a balanced intake and output will improve Outcome: Progressing   Problem: Health Behavior/Discharge Planning: Goal: Ability to identify and utilize available resources and services will improve Outcome: Progressing Goal: Ability to manage health-related needs will improve Outcome: Progressing   Problem: Metabolic: Goal: Ability to maintain appropriate glucose levels will improve Outcome: Progressing   Problem: Nutritional: Goal: Maintenance of adequate nutrition will improve Outcome: Progressing Goal: Progress toward achieving an optimal weight will improve Outcome: Progressing   Problem: Skin Integrity: Goal: Risk for impaired skin integrity will decrease Outcome: Progressing   Problem: Tissue Perfusion: Goal: Adequacy of tissue perfusion will improve Outcome: Progressing   Problem: Education: Goal: Knowledge of General Education information will improve Description: Including pain rating scale, medication(s)/side effects and non-pharmacologic comfort measures Outcome: Progressing   Problem: Health Behavior/Discharge Planning: Goal: Ability to manage health-related needs will improve Outcome: Progressing   Problem: Clinical Measurements: Goal: Ability to maintain clinical measurements within normal limits will improve Outcome: Progressing Goal: Will remain free from infection Outcome: Progressing Goal: Diagnostic test results will improve Outcome: Progressing   Problem: Activity: Goal: Risk for activity intolerance will decrease Outcome: Progressing   Problem:  Nutrition: Goal: Adequate nutrition will be maintained Outcome: Progressing   Problem: Coping: Goal: Level of anxiety will decrease Outcome: Progressing   Problem: Elimination: Goal: Will not experience complications related to bowel motility Outcome: Progressing Goal: Will not experience complications related to urinary retention Outcome: Progressing   Problem: Pain Managment: Goal: General experience of comfort will improve Outcome: Progressing   Problem: Safety: Goal: Ability to remain free from injury will improve Outcome: Progressing   Problem: Skin Integrity: Goal: Risk for impaired skin integrity will decrease Outcome: Progressing

## 2023-01-28 NOTE — Progress Notes (Signed)
[facility]  ROUNDING NOTE   Subjective:   Patient seen laying in bed Alert and oriented Pain well managed   NPWV in place   Objective:  Vital signs in last 24 hours:  Temp:  [97.6 F (36.4 C)-98.1 F (36.7 C)] 98.1 F (36.7 C) (10/06 0830) Pulse Rate:  [68-86] 70 (10/06 0830) Resp:  [16-24] 18 (10/06 0830) BP: (98-110)/(55-86) 110/68 (10/06 0830) SpO2:  [95 %-100 %] 99 % (10/06 0830) Weight:  [122.3 kg] 122.3 kg (10/05 1210)  Weight change:  Filed Weights   01/25/23 1206 01/27/23 0811 01/27/23 1210  Weight: 121.3 kg 124.8 kg 122.3 kg    Intake/Output: I/O last 3 completed shifts: In: 1089.1 [P.O.:240; IV Piggyback:849.1] Out: 2000 [Other:2000]   Intake/Output this shift:  Total I/O In: 50 [IV Piggyback:50] Out: -   Physical Exam: General: No acute distress  Head: Normocephalic, atraumatic. Moist oral mucosal membranes  Lungs:  Clear to auscultation, normal effort  Heart: S1S2 no rubs  Abdomen:  Soft, nontender, bowel sounds present  Extremities: No peripheral edema.RLE NPWV  Neurologic: Awake, alert, following commands  Skin: No acute rash, right foot surgical dressing  Access: Right IJ PermCath    Basic Metabolic Panel: Recent Labs  Lab 01/24/23 0458 01/25/23 0450 01/26/23 0503 01/27/23 0548 01/28/23 0538  NA 136 137 137 137 137  K 4.6 5.7* 5.1 5.3* 4.6  CL 99 101 98 100 100  CO2 25 20* 24 18* 17*  GLUCOSE 102* 209* 162* 87 92  BUN 60* 84* 71* 94* 72*  CREATININE 12.31* 14.63* 10.57* 12.92* 10.26*  CALCIUM 6.6* 6.9* 6.8* 6.2* 6.8*  PHOS  --   --  4.4 7.9* 6.5*    Liver Function Tests: Recent Labs  Lab 01/22/23 0501 01/26/23 0503 01/27/23 0548 01/28/23 0538  AST 12*  --   --   --   ALT 14  --   --   --   ALKPHOS 47  --   --   --   BILITOT 0.9  --   --   --   PROT 6.7  --   --   --   ALBUMIN 3.1* 3.1* 3.3* 3.3*   No results for input(s): "LIPASE", "AMYLASE" in the last 168 hours. No results for input(s): "AMMONIA" in  the last 168 hours.  CBC: Recent Labs  Lab 01/22/23 0501 01/23/23 0749 01/24/23 0458 01/25/23 0450  WBC 5.5 5.9 4.4 4.8  NEUTROABS  --  4.0 2.8 4.4  HGB 10.0* 9.3* 9.2* 10.7*  HCT 30.7* 27.9* 28.0* 32.5*  MCV 90.6 89.4 89.2 89.0  PLT 137* 134* 130* 151    Cardiac Enzymes: No results for input(s): "CKTOTAL", "CKMB", "CKMBINDEX", "TROPONINI" in the last 168 hours.  BNP: Invalid input(s): "POCBNP"  CBG: Recent Labs  Lab 01/23/23 1612 01/23/23 2247 01/24/23 0830 01/24/23 1118 01/24/23 2103  GLUCAP 185* 105* 85 93 216*    Microbiology: Results for orders placed or performed during the hospital encounter of 01/21/23  Blood Culture (routine x 2)     Status: None   Collection Time: 01/21/23  5:08 AM   Specimen: BLOOD  Result Value Ref Range Status   Specimen Description BLOOD BLOOD RIGHT ARM  Final   Special Requests   Final    BOTTLES DRAWN AEROBIC AND ANAEROBIC Blood Culture results may not be optimal due to an inadequate volume of blood received in culture bottles   Culture   Final    NO GROWTH 5 DAYS Performed  at [facility], [address] [place], [place] [address]    Report Status 01/26/2023 FINAL  Final  Blood Culture (routine x 2)     Status: None   Collection Time: 01/21/23  5:31 AM   Specimen: BLOOD  Result Value Ref Range Status   Specimen Description BLOOD BLOOD LEFT ARM  Final   Special Requests   Final    BOTTLES DRAWN AEROBIC AND ANAEROBIC Blood Culture results may not be optimal due to an inadequate volume of blood received in culture bottles   Culture   Final    NO GROWTH 5 DAYS Performed at [facility], [address], [place], [place] [address]    Report Status 01/26/2023 FINAL  Final  Aerobic/Anaerobic Culture w Gram Stain (surgical/deep wound)     Status: None (Preliminary result)   Collection Time: 01/23/23  1:13 PM   Specimen: Wound; Body Fluid  Result Value Ref Range Status   Specimen Description   Final     WOUND Performed at [facility], [address] [place], [place] [address]    Special Requests   Final    RIGHT 5TH METATARSEL ULCER Performed at [facility], [address] [place], [place] [address]    Gram Stain   Final    NO WBC SEEN NO ORGANISMS SEEN Performed at [facility], [address], [place], [place] [address]    Culture   Final    RARE ERYSIPELOTHRIX RHUSIOPATHIAE Standardized susceptibility testing for this organism is not available. NO ANAEROBES ISOLATED; CULTURE IN PROGRESS FOR 5 DAYS    Report Status PENDING  Incomplete  Aerobic/Anaerobic Culture w Gram Stain (surgical/deep wound)     Status: None (Preliminary result)   Collection Time: 01/23/23  1:16 PM   Specimen: Wound  Result Value Ref Range Status   Specimen Description   Final    WOUND Performed at [facility], [address] [place], [place] [address]    Special Requests   Final    RIGHT 5TH MATATARSEL BONE Performed at [facility], [address] [place], [place] [address]    Gram Stain NO WBC SEEN NO ORGANISMS SEEN   Final   Culture   Final    NO GROWTH 4 DAYS NO GROWTH 3 DAYS Performed at [facility], [address], [place], [place] [address]    Report Status PENDING  Incomplete    Coagulation Studies: No results for input(s): "LABPROT", "INR" in the last 72 hours.   Urinalysis: No results for input(s): "COLORURINE", "LABSPEC", "PHURINE", "GLUCOSEU", "HGBUR", "BILIRUBINUR", "KETONESUR", "PROTEINUR", "UROBILINOGEN", "NITRITE", "LEUKOCYTESUR" in the last 72 hours.  Invalid input(s): "APPERANCEUR"    Imaging: No results found.   Medications:    sodium chloride      ceFAZolin (ANCEF) IV Stopped (01/28/23 1000)   sodium chloride 250 mL/hr at 01/27/23 0556    (feeding supplement) PROSource Plus  30 mL Oral TID BM   vitamin C  500 mg Oral BID   Chlorhexidine Gluconate Cloth  6 each Topical Daily   heparin  5,000 Units  Subcutaneous Q8H   multivitamin  1 tablet Oral QHS   nutrition supplement (JUVEN)  1 packet Oral BID BM   sodium chloride flush  3 mL Intravenous Q12H   sodium zirconium cyclosilicate  5 g Oral Once   zinc sulfate  220 mg Oral Daily   sodium chloride, acetaminophen, alteplase, heparin, hydrOXYzine, iodixanol, oxyCODONE-acetaminophen **AND** oxyCODONE, sodium chloride  flush  Assessment/ Plan:  47 y.o. male ESRD on HD TTS, CHF, history of Guillain-Barr syndrome, hypertension, anemia of chronic kidney disease, secondary hyperparathyroidism, history of right foot toe amputation who presents with ulceration on lateral aspect of right foot.  [facility]  1.  ESRD on HD TTS.  Dialysis received yesterday, UF 2L as tolerated. Next treatment scheduled for Tuesday.   2.  Anemia of chronic kidney disease.   Lab Results  Component Value Date   HGB 10.7 (L) 01/25/2023   Patient receives Mircera as outpatient.  Hemoglobin stable  3.  Secondary hyperparathyroidism.   Patient prescribed auryxia with meals outpatient   Calcium improved with 3 calcium bath during dialysis. Will continue this with dialysis.   4.  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  Currently on IV cefazolin and metronidazole.  Imaging suspicious for osteomyelitis.  Podiatry performed debridement on 01/23/23 with bone biopsy and placement on wound vac. Vascular performed angiogram on 01/24/23 with stent placement. Will continue cefazolin 1g with each dialysis treatment at discharge until 02/19/23.    LOS: 7   10/6/202411:34 AM

## 2023-01-29 ENCOUNTER — Encounter: Payer: Self-pay | Admitting: Podiatry

## 2023-01-29 DIAGNOSIS — E11628 Type 2 diabetes mellitus with other skin complications: Secondary | ICD-10-CM | POA: Diagnosis not present

## 2023-01-29 DIAGNOSIS — G629 Polyneuropathy, unspecified: Secondary | ICD-10-CM | POA: Diagnosis not present

## 2023-01-29 DIAGNOSIS — L089 Local infection of the skin and subcutaneous tissue, unspecified: Secondary | ICD-10-CM | POA: Diagnosis not present

## 2023-01-29 DIAGNOSIS — I739 Peripheral vascular disease, unspecified: Secondary | ICD-10-CM | POA: Diagnosis present

## 2023-01-29 DIAGNOSIS — L97519 Non-pressure chronic ulcer of other part of right foot with unspecified severity: Secondary | ICD-10-CM | POA: Diagnosis not present

## 2023-01-29 LAB — AEROBIC/ANAEROBIC CULTURE W GRAM STAIN (SURGICAL/DEEP WOUND)
Culture: NO GROWTH
Gram Stain: NONE SEEN

## 2023-01-29 MED ORDER — ZINC SULFATE 220 (50 ZN) MG PO CAPS: 220.0000 mg | ORAL_CAPSULE | Freq: Every day | ORAL | Status: DC

## 2023-01-29 MED ORDER — ASPIRIN 81 MG PO TBEC
81.0000 mg | DELAYED_RELEASE_TABLET | Freq: Every day | ORAL | Status: DC
Start: 2023-01-29 — End: 2024-01-25

## 2023-01-29 MED ORDER — METRONIDAZOLE 500 MG PO TABS
500.0000 mg | ORAL_TABLET | Freq: Two times a day (BID) | ORAL | 0 refills | Status: DC
Start: 2023-01-29 — End: 2023-02-22

## 2023-01-29 MED ORDER — ASCORBIC ACID 500 MG PO TABS: 500.0000 mg | ORAL_TABLET | Freq: Two times a day (BID) | ORAL | Status: DC

## 2023-01-29 MED ORDER — JUVEN PO PACK: 1.0000 | PACK | Freq: Two times a day (BID) | ORAL | 1 refills | Status: DC

## 2023-01-29 MED ORDER — PROSOURCE PLUS PO LIQD: 30.0000 mL | Freq: Three times a day (TID) | ORAL | 1 refills | Status: DC

## 2023-01-29 MED ORDER — ATORVASTATIN CALCIUM 40 MG PO TABS
40.0000 mg | ORAL_TABLET | Freq: Every day | ORAL | 2 refills | Status: DC
Start: 2023-01-29 — End: 2024-01-25

## 2023-01-29 MED ORDER — METRONIDAZOLE 500 MG/100ML IV SOLN
500.0000 mg | Freq: Two times a day (BID) | INTRAVENOUS | Status: DC
  Filled 2023-01-29: qty 100

## 2023-01-29 MED ORDER — RENA-VITE PO TABS: 1.0000 | ORAL_TABLET | Freq: Every day | ORAL | 2 refills | Status: DC

## 2023-01-29 MED ORDER — CLOPIDOGREL BISULFATE 75 MG PO TABS: 75.0000 mg | ORAL_TABLET | Freq: Every day | ORAL | 2 refills | Status: DC

## 2023-01-29 MED ORDER — SODIUM CHLORIDE 0.9 % IV SOLN
1.0000 g | Freq: Once | INTRAVENOUS | Status: AC
  Administered 2023-01-29: 1 g via INTRAVENOUS
  Filled 2023-01-29: qty 1

## 2023-01-29 MED ORDER — CEFTAZIDIME IV (FOR PTA / DISCHARGE USE ONLY): INTRAVENOUS | 0 refills | Status: DC

## 2023-01-29 MED ORDER — METRONIDAZOLE 500 MG PO TABS
500.0000 mg | ORAL_TABLET | Freq: Two times a day (BID) | ORAL | Status: DC
  Administered 2023-01-29 – 2023-01-30 (×2): 500 mg via ORAL
  Filled 2023-01-29 (×2): qty 1

## 2023-01-29 NOTE — Consult Note (Signed)
WOC Nurse wound follow up Wound type: full thickness, post debridement R lateral foot  Measurement: 3 cm x 3 cm x 0.8 cm  Wound bed:100% red moist  Drainage (amount, consistency, odor) minimal serosanguinous  Periwound: some maceration noted at 6 o'clock and 3 o'clock  Dressing procedure/placement/frequency: Removed old NPWT dressing Cleansed wound with normal saline Periwound skin protected with skin barrier wipe Skin protected to the anterior foot with VAC drape for foam bridge  Filled wound with  1 piece black foam  Sealed NPWT dressing at HG; I did place a ABD pad between patients leg and Vac trac pad and wrapped foot and leg with Kerlix then secured entire dressing with Ace bandage.   Patient received PO pain medication per bedside nurse prior to dressing change Patient tolerated procedure well  WOC nurse will continue to provide NPWT dressing changes due to the complexity of the dressing change. Next change Thursday 02/01/2023 if remains inpatient.     Thank you,    Priscella Mann MSN, RN-BC, [employer] [phone]

## 2023-01-29 NOTE — Progress Notes (Signed)
PHARMACY CONSULT NOTE FOR:  OUTPATIENT  PARENTERAL ANTIBIOTIC THERAPY (OPAT)  Indication: Osteomyelitis  Regimen: Ceftazidime 1 g IV on Tuesday, ceftazidime 1 g IV on Thursday, and ceftazidime 2 g IV on Saturday End date: 02/19/23  IV antibiotic discharge orders are pended. To discharging provider:  please sign these orders via discharge navigator,  Select New Orders & click on the button choice - Manage This Unsigned Work.     Thank you for allowing pharmacy to be a part of this patient's care.  Merryl Hacker, PharmD Clinical Pharmacist 01/29/2023, 11:26 AM

## 2023-01-29 NOTE — Progress Notes (Signed)
Progress Note    01/29/2023 2:24 PM 5 Days Post-Op  Subjective:  Patrick Downs is a 47 yo male who is now POD #5 from aortogram with selective right lower extremity angiogram with angioplasty and stent placement to the right distal SFA above-the-knee L popliteal artery stent.  Patient is also postoperative from excisional debridement of an ulcer to the bone of the right fifth metatarsal with placement of wound VAC and vancomycin antibiotic beads.  Patient is resting comfortably this afternoon in bed eating lunch with a family member at his side.  Patient voices no complaints at this time.  Bilateral lower extremities are warm to touch.  Patient's right foot is bandaged and covered with an Ace bandage.  I did not remove the dressing as the nurse had just changed it.  Patient is recovering as expected.  Vitals are remained stable.   Vitals:   01/29/23 0451 01/29/23 0751  BP: 132/83 116/68  Pulse: 71 66  Resp: 18 19  Temp: 97.7 F (36.5 C) (!) 97.4 F (36.3 C)  SpO2: 99% 94%   Physical Exam: Cardiac:  RRR, normal S1 and S2.  No murmurs appreciated Lungs: Normal respiratory effort, clear on auscultation throughout.  No rales rhonchi or wheezing noted. Incisions: Right groin incision without bandage.  No hematoma seroma to note.  Patient noted to have right lower extremity bandage to his foot post foot ulcer debridement.  Clean dry and intact.  No drainage noted Extremities: Bilateral lower extremities warm to touch. Abdomen: Positive bowel sounds throughout, soft, nondistended and nontender. Neurologic: Alert and oriented x 4, answers all questions and follows commands appropriately.  CBC    Component Value Date/Time   WBC 4.8 01/25/2023 0450   RBC 3.65 (L) 01/25/2023 0450   HGB 10.7 (L) 01/25/2023 0450   HGB 7.7 (L) 08/22/2014 0653   HCT 32.5 (L) 01/25/2023 0450   HCT 23.9 (L) 08/22/2014 0653   PLT 151 01/25/2023 0450   PLT 195 08/22/2014 0653   MCV 89.0 01/25/2023 0450   MCV  92 08/22/2014 0653   MCH 29.3 01/25/2023 0450   MCHC 32.9 01/25/2023 0450   RDW 14.5 01/25/2023 0450   RDW 17.3 (H) 08/22/2014 0653   LYMPHSABS 0.3 (L) 01/25/2023 0450   LYMPHSABS 0.9 (L) 08/22/2014 0653   MONOABS 0.1 01/25/2023 0450   MONOABS 1.6 (H) 08/22/2014 0653   EOSABS 0.0 01/25/2023 0450   EOSABS 0.0 08/22/2014 0653   BASOSABS 0.0 01/25/2023 0450   BASOSABS 0.0 08/22/2014 0653    BMET    Component Value Date/Time   NA 137 01/28/2023 0538   NA 136 08/22/2014 0653   K 4.6 01/28/2023 0538   K 5.1 08/22/2014 0653   CL 100 01/28/2023 0538   CL 95 (L) 08/22/2014 0653   CO2 17 (L) 01/28/2023 0538   CO2 27 08/22/2014 0653   GLUCOSE 92 01/28/2023 0538   GLUCOSE 176 (H) 08/22/2014 0653   BUN 72 (H) 01/28/2023 0538   BUN 71 (H) 08/22/2014 0653   CREATININE 10.26 (H) 01/28/2023 0538   CREATININE 8.87 (H) 08/22/2014 0653   CALCIUM 6.8 (L) 01/28/2023 0538   CALCIUM 7.1 (L) 08/22/2014 0653   GFRNONAA 6 (L) 01/28/2023 0538   GFRNONAA 7 (L) 08/22/2014 0653   GFRAA NOT CALCULATED 07/31/2022 1530   GFRAA 8 (L) 08/22/2014 0653    INR    Component Value Date/Time   INR 1.2 01/21/2023 0451   INR 1.0 08/12/2014 0645     Intake/Output  Summary (Last 24 hours) at 01/29/2023 1424 Last data filed at 01/28/2023 2110 Gross per 24 hour  Intake 240 ml  Output --  Net 240 ml     Assessment/Plan:  47 y.o. male is s/p left lower extremity angiogram with angioplasty and stent placement as well as excisional debridement of the patient's right fifth metatarsal bone.  5 Days Post-Op   PLAN: Per vascular surgery okay to discharge patient.  Patient will need to be discharged on aspirin and Plavix due to stent placement.  Aspirin is 81 mg daily and Plavix will be 75 mg daily.  Patient will also need to continue his home statin.  It appears patient will be going home on IV antibiotics as well for the next 4 weeks.  Therefore vascular surgery will follow-up with the patient after he has seen  podiatry and follow-up and infectious disease.  DVT prophylaxis: Heparin 5000 units subcu every 8 hours.   Marcie Bal [facility] 01/29/2023 2:24 PM

## 2023-01-29 NOTE — TOC Transition Note (Signed)
Transition of Care [facility]) - CM/SW Discharge Note   Patient Details  Name: Patrick Downs MRN: [id number] Date of Birth: Mar 30, 1976  Transition [facility]) CM/SW Contact:  Allena Katz, LCSW Phone Number: 01/29/2023, 9:37 AM   Clinical Narrative:   Pt discharging home with home health. Cheryl with [facility] notified and will be able to staff [facility] nursing for MWF wound vac changes. BSC and tub transfer bench ordered through [employer] and will be delivered to patients room. Pt to get antibiotics at dialysis and does not need them at home.   Final next level of care: Home w Home Health Services Barriers to Discharge: Continued Medical Work up   Patient Goals and CMS Choice CMS Medicare.gov Compare Post Acute Care list provided to:: Patient Choice offered to / list presented to : Patient  Discharge Placement                         Discharge Plan and Services Additional resources added to the After Visit Summary for                    DME Agency: [employer]     Representative spoke with at DME Agency: tracy HH Arranged: RN   Date [facility] Agency Contacted: 01/24/23   Representative spoke with at [facility] Agency: cheryl  Social Determinants of Health (SDOH) Interventions SDOH Screenings   Food Insecurity: No Food Insecurity (09/11/2022)   Received from [facility], [facility]  Transportation Needs: No Transportation Needs (09/11/2022)   Received from [facility], [facility]  Depression [phone]): Low Risk  (06/27/2021)  Financial Resource Strain: Low Risk  (09/11/2022)   Received from [facility], [facility]  Social Connections: Unknown (09/11/2022)   Received from [facility], [facility]  Stress: No Stress Concern Present (09/11/2022)   Received from [facility], [facility]  Tobacco Use: Low Risk   (12/22/2022)     Readmission Risk Interventions    01/22/2023   10:18 AM  Readmission Risk Prevention Plan  Transportation Screening Complete  PCP or Specialist Appt within 3-5 Days Complete  HRI or Home Care Consult Complete  Social Work Consult for Recovery Care Planning/Counseling Patient refused  Palliative Care Screening Not Applicable  Medication Review Oceanographer) Complete

## 2023-01-29 NOTE — Progress Notes (Signed)
[facility]  ROUNDING NOTE   Subjective:   Patient laying in bed Denies pain from procedure site Alert Tolerating meals  NPWV in place   Objective:  Vital signs in last 24 hours:  Temp:  [97.4 F (36.3 C)-98.7 F (37.1 C)] 97.4 F (36.3 C) (10/07 0751) Pulse Rate:  [66-78] 66 (10/07 0751) Resp:  [15-19] 19 (10/07 0751) BP: (104-132)/(68-83) 116/68 (10/07 0751) SpO2:  [94 %-100 %] 94 % (10/07 0751)  Weight change:  Filed Weights   01/25/23 1206 01/27/23 0811 01/27/23 1210  Weight: 121.3 kg 124.8 kg 122.3 kg    Intake/Output: I/O last 3 completed shifts: In: 630 [P.O.:480; IV Piggyback:150] Out: -    Intake/Output this shift:  No intake/output data recorded.  Physical Exam: General: No acute distress  Head: Normocephalic, atraumatic. Moist oral mucosal membranes  Lungs:  Clear to auscultation, normal effort  Heart: S1S2 no rubs  Abdomen:  Soft, nontender, bowel sounds present  Extremities: No peripheral edema.RLE NPWV  Neurologic: Awake, alert, following commands  Skin: No acute rash, right foot surgical dressing  Access: Right IJ PermCath    Basic Metabolic Panel: Recent Labs  Lab 01/24/23 0458 01/25/23 0450 01/26/23 0503 01/27/23 0548 01/28/23 0538  NA 136 137 137 137 137  K 4.6 5.7* 5.1 5.3* 4.6  CL 99 101 98 100 100  CO2 25 20* 24 18* 17*  GLUCOSE 102* 209* 162* 87 92  BUN 60* 84* 71* 94* 72*  CREATININE 12.31* 14.63* 10.57* 12.92* 10.26*  CALCIUM 6.6* 6.9* 6.8* 6.2* 6.8*  PHOS  --   --  4.4 7.9* 6.5*    Liver Function Tests: Recent Labs  Lab 01/26/23 0503 01/27/23 0548 01/28/23 0538  ALBUMIN 3.1* 3.3* 3.3*   No results for input(s): "LIPASE", "AMYLASE" in the last 168 hours. No results for input(s): "AMMONIA" in the last 168 hours.  CBC: Recent Labs  Lab 01/23/23 0749 01/24/23 0458 01/25/23 0450  WBC 5.9 4.4 4.8  NEUTROABS 4.0 2.8 4.4  HGB 9.3* 9.2* 10.7*  HCT 27.9* 28.0* 32.5*  MCV 89.4 89.2 89.0  PLT 134*  130* 151    Cardiac Enzymes: No results for input(s): "CKTOTAL", "CKMB", "CKMBINDEX", "TROPONINI" in the last 168 hours.  BNP: Invalid input(s): "POCBNP"  CBG: Recent Labs  Lab 01/23/23 1612 01/23/23 2247 01/24/23 0830 01/24/23 1118 01/24/23 2103  GLUCAP 185* 105* 85 93 216*    Microbiology: Results for orders placed or performed during the hospital encounter of 01/21/23  Blood Culture (routine x 2)     Status: None   Collection Time: 01/21/23  5:08 AM   Specimen: BLOOD  Result Value Ref Range Status   Specimen Description BLOOD BLOOD RIGHT ARM  Final   Special Requests   Final    BOTTLES DRAWN AEROBIC AND ANAEROBIC Blood Culture results may not be optimal due to an inadequate volume of blood received in culture bottles   Culture   Final    NO GROWTH 5 DAYS Performed at [facility], [address], [place], [place] [address]    Report Status 01/26/2023 FINAL  Final  Blood Culture (routine x 2)     Status: None   Collection Time: 01/21/23  5:31 AM   Specimen: BLOOD  Result Value Ref Range Status   Specimen Description BLOOD BLOOD LEFT ARM  Final   Special Requests   Final    BOTTLES DRAWN AEROBIC AND ANAEROBIC Blood Culture results may not be optimal due to an inadequate  volume of blood received in culture bottles   Culture   Final    NO GROWTH 5 DAYS Performed at [facility], [address] [place], [place] [address]    Report Status 01/26/2023 FINAL  Final  Aerobic/Anaerobic Culture w Gram Stain (surgical/deep wound)     Status: None (Preliminary result)   Collection Time: 01/23/23  1:13 PM   Specimen: Wound; Body Fluid  Result Value Ref Range Status   Specimen Description   Final    WOUND Performed at [facility], [address] [place], [place] [address]    Special Requests   Final    RIGHT 5TH METATARSEL ULCER Performed at [facility], [address] [place], [place] [address]    Gram Stain NO WBC SEEN NO  ORGANISMS SEEN   Final   Culture   Final    RARE ERYSIPELOTHRIX RHUSIOPATHIAE Standardized susceptibility testing for this organism is not available. NO ANAEROBES ISOLATED Performed at [facility], [address], [place], [place] [address]    Report Status PENDING  Incomplete  Aerobic/Anaerobic Culture w Gram Stain (surgical/deep wound)     Status: None   Collection Time: 01/23/23  1:16 PM   Specimen: Wound  Result Value Ref Range Status   Specimen Description   Final    WOUND Performed at [facility], [address] [place], [place] [address]    Special Requests   Final    RIGHT 5TH MATATARSEL BONE Performed at [facility], [address] [place], [place] [address]    Gram Stain NO WBC SEEN NO ORGANISMS SEEN   Final   Culture   Final    No growth aerobically or anaerobically. Performed at [facility], [address], [place], [place] [address]    Report Status 01/29/2023 FINAL  Final    Coagulation Studies: No results for input(s): "LABPROT", "INR" in the last 72 hours.   Urinalysis: No results for input(s): "COLORURINE", "LABSPEC", "PHURINE", "GLUCOSEU", "HGBUR", "BILIRUBINUR", "KETONESUR", "PROTEINUR", "UROBILINOGEN", "NITRITE", "LEUKOCYTESUR" in the last 72 hours.  Invalid input(s): "APPERANCEUR"    Imaging: No results found.   Medications:    sodium chloride     cefTAZidime (FORTAZ)  IV     sodium chloride 250 mL/hr at 01/27/23 0556    (feeding supplement) PROSource Plus  30 mL Oral TID BM   vitamin C  500 mg Oral BID   Chlorhexidine Gluconate Cloth  6 each Topical Daily   heparin  5,000 Units Subcutaneous Q8H   metroNIDAZOLE  500 mg Oral Q12H   multivitamin  1 tablet Oral QHS   nutrition supplement (JUVEN)  1 packet Oral BID BM   sodium chloride flush  3 mL Intravenous Q12H   sodium zirconium cyclosilicate  5 g Oral Once   zinc sulfate  220 mg Oral Daily   sodium chloride, acetaminophen, alteplase, heparin,  hydrOXYzine, iodixanol, oxyCODONE-acetaminophen **AND** oxyCODONE, sodium chloride flush  Assessment/ Plan:  47 y.o. male ESRD on HD TTS, CHF, history of Guillain-Barr syndrome, hypertension, anemia of chronic kidney disease, secondary hyperparathyroidism, history of right foot toe amputation who presents with ulceration on lateral aspect of right foot.  [facility]  1.  ESRD on HD TTS.  Next treatment scheduled for Tuesday.   2.  Anemia of chronic kidney disease.   Lab Results  Component Value Date   HGB 10.7 (L) 01/25/2023   Patient receives Mircera as outpatient.  Hemoglobin stable  3.  Secondary hyperparathyroidism.   Patient prescribed auryxia with meals outpatient   Will continue [place] of 3 calcium bath with dialysis.   4.  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  Currently on IV cefazolin and metronidazole.  Imaging suspicious for osteomyelitis.  Podiatry performed debridement on 01/23/23 with bone biopsy and placement on wound vac. Vascular performed angiogram on 01/24/23 with stent placement.   ID following and recommend Ceftazdime 1g with each dialysis treatment until 02/19/23. Outpatient clinic notified.     LOS: 8   10/7/202412:57 PM

## 2023-01-29 NOTE — TOC CM/SW Note (Addendum)
Patient is not able to walk the distance required to go the bathroom, or he/she is unable to safely negotiate stairs required to access the bathroom.  A 3in1 BSC will alleviate this problem  

## 2023-01-29 NOTE — Treatment Plan (Addendum)
Diagnosis: Foot infection Chronic osteomyelitis Baseline Creatinine ESRD  Culture Result:  pending  prelim said erysipelothrix- lab repeating the test  Allergies  Allergen Reactions   Hepatitis B Vaccine     Other reaction(s): Unknown   Ondansetron Other (See Comments)    Stomach pain    Minoxidil Other (See Comments)    "put fluid around my heart", PERICARDIAL EFFUSION   Morphine And Codeine Other (See Comments)    Aggressive    Omnipaque [Iohexol] Itching and Other (See Comments)    Rigors on one occasion, widespread itching on a separate occasion (resolved with Benadryl), tremors    OPAT Orders Discharge antibiotics: Ceftazidime 1 gram on Tuesday ! Gram on thursday 2 grams on Saturday Duration: 4 weeks End Date: 02/19/23  PLUS Flagyl 500mg  PO BID until 02/19/23  P.S. lab culture result being confirmed , so there is a possibility of change in antibiotic once the culture is finalized   Labs weekly while on IV antibiotics: _X_ CBC with differential  _X_ CMP _X_ CRP _X_ ESR    Fax weekly lab results  promptly to [phone]  Clinic Follow Up Appt: with Dr.Lige Lakeman 02/15/23 at 10.45 AM   Call [phone] with nay questions or critical values

## 2023-01-29 NOTE — Care Management Important Message (Signed)
Important Message  Patient Details  Name: Patrick Downs MRN: [id number] Date of Birth: 1975-11-26   Important Message Given:  Yes - Medicare IM     Jamesen Stahnke, Stephan Minister 01/29/2023, 3:21 PM

## 2023-01-29 NOTE — Assessment & Plan Note (Signed)
Aspirin, Plavix, Lipitor Follow up with Vascular surgery

## 2023-01-29 NOTE — Assessment & Plan Note (Addendum)
Due to ESRD. Mgmt per nephro with dialysis

## 2023-01-29 NOTE — Discharge Summary (Addendum)
 Physician Discharge Summary   Patient: Patrick Downs MRN: [id number] DOB: 12/17/1975  Admit date:     01/21/2023  Discharge date: 01/30/23  Discharge Physician: Pennie Banter   PCP: Mick Sell, MD   Recommendations at discharge:   Follow up with Podiatry in 2 weeks Follow Up with Infectious Disease - Dr.Ravishankar 02/15/23 at 10.45 AM IV antibiotics to be given at Dialysis through 02/19/2023 Follow up with Primary Care in 1-2 weeks Repeat renal function panel, CBC at follow up Wound vac changes recommend 3 times weekly, per podiatry Home health PT for Right foot non-weight-bearing status Resume routine Dialysis sessions as scheduled & follow up with Nephrology    OPAT Orders Discharge antibiotics: Ceftazidime  1 gram on Tuesday 1 Gram on thursday 2 grams on Saturday Duration:  4 weeks / End Date: 02/19/23   PLUS Flagyl 500mg  PO BID until 02/19/23   P.S. lab culture result being confirmed , so there is a possibility of change in antibiotic once the culture is finalized   Labs weekly while on IV antibiotics: _X_ CBC with differential _X_ CMP _X_ CRP _X_ ESR   Fax weekly lab results  promptly to [phone] Call [phone] with nay questions or critical values   Discharge Diagnoses: Principal Problem:   Cellulitis Active Problems:   Type 2 diabetes mellitus with peripheral neuropathy (HCC)   ESRD on dialysis (HCC)   GERD (gastroesophageal reflux disease)   Polyneuropathy, unspecified   Ulcer of right foot (HCC)   Osteomyelitis of right foot (HCC)   PAD (peripheral artery disease) (HCC)  Resolved Problems:   * No resolved hospital problems. *   ADDENDUM 10/8 -- discharge yesterday had to be cancelled as walker was not delivered, pt non-weight-bearing on right foot.  He remains medically stable for d/c.     Hospital Course:  From HPI "Patrick Downs is a 47 y.o. male with medical history significant of ESRD on hemodialysis Tuesday  Thursday Saturday, GERD, hypertension, gout, type 2 diabetes, peripheral neuropathy, prior toe amputation presenting with right lower extremity cellulitis.  Patient reports ulcer formation on the right lateral foot.  Has had worsening drainage over multiple days.  Mom malaise.  Patient does wear a brace on the right foot.  Has been unable to wear brace secondary to swelling.  Noted malodorous drainage from wound.  No fevers or chills.  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  Non-smoker.  Patient denies any alcohol use.  Has been compliant with hemodialysis regimen. Presented to the ER Tmax 99.4, blood pressures 90s to 110s over 50s to 70s.  Satting well on room air.  White count 8.1, hemoglobin 10.2, platelets 150.  Creatinine 7.7, T. bili 1.5.  Right foot plain films with superficial soft tissue wound with subcutaneous gas adjacent to the base of the fifth metatarsal bone."    Further hospital course and management as outlined below.   10/7 -- pt underwent dialysis today.  He denies acute complaints.  Discharge cancelled yesterday due to walker not being delivered with other DME.  Pt remains medically stable for discharge home.    Assessment and Plan:  Cellulitis of the right fourth toe with underlying osteomyelitis Wet gangrene without evidence of gas gangrene  Right lateral foot cellulitis in setting of end-stage renal disease with prior amputations in the past MRI of the right foot showing soft tissue ulceration lateral to the base of the fifth metatarsal with underlying marrow changes at the  base of the fifth metatarsal suspicious for osteomyelitis.  No abscess detected. 10/1 - podiatry did excisional debridement and bone biopsy, wound vac placed - See Op Note 10/2 - vascular did RLE angio & re-vascularization - see Op Note --Initially on broad spectrum - Vanc/Cefepime/Flagyl --Consults -- Infectious disease, Vascular surgery, Podiatry --Now on ceftazidine- to be given at  dialysis TTS - end date 02/19/23 per ID - see OPAT orders --Wound care RN following --Wound vac changes Monday/Thursday while admitted --At d/c, wound vac changes 3 times / week --[facility] RN arranged to assist --Non-weight bearing on RIGHT foot --PT evaluation - HH recommended --Follow up with Podiatry in 2 weeks     Type 2 diabetes mellitus with peripheral neuropathy (HCC) Well controlled - A1c 6.0% Pt refusing CBG's and insulin per nursing --D/C CBG's and sliding scale insulin for now --Resume if fasting glucose on BMP's is > 180   ESRD on dialysis (HCC) Baseline ESRD on HD TTS  No reported missed episodes of HD  --Nephrology following for dialysis --Monitor renal function panels     Polyneuropathy, unspecified Continue Lyrica   GERD (gastroesophageal reflux disease) PPI    Hyperkalemia - due to ESRD Mgmt per nephrology with dialysis        Consultants: Podiatry, Infectious disease, Vascular  surgery  Procedures performed: as above   Disposition: Home health  Diet recommendation:  Renal diet, 1200 cc fluid restrcition  DISCHARGE MEDICATION: Allergies as of 01/29/2023       Reactions   Hepatitis B Vaccine    Other reaction(s): Unknown   Ondansetron Other (See Comments)   Stomach pain    Minoxidil Other (See Comments)   "put fluid around my heart", PERICARDIAL EFFUSION   Morphine And Codeine Other (See Comments)   Aggressive    Omnipaque [iohexol] Itching, Other (See Comments)   Rigors on one occasion, widespread itching on a separate occasion (resolved with Benadryl), tremors        Medication List     TAKE these medications    (feeding supplement) PROSource Plus liquid Take 30 mLs by mouth 3 (three) times daily between meals.   nutrition supplement (JUVEN) Pack Take 1 packet by mouth 2 (two) times daily between meals.   acetaminophen 500 MG tablet Commonly known as: TYLENOL Take 1,000-1,500 mg by mouth 2 (two) times daily as needed for  moderate pain or headache.   Alpha-Lipoic Acid 600 MG Tabs Take 1 tablet by mouth daily.   ascorbic acid 500 MG tablet Commonly known as: VITAMIN C Take 1 tablet (500 mg total) by mouth 2 (two) times daily.   aspirin EC 81 MG tablet Take 1 tablet (81 mg total) by mouth daily. Swallow whole.   atorvastatin 40 MG tablet Commonly known as: Lipitor Take 1 tablet (40 mg total) by mouth daily.   Auryxia 1 GM 210 MG(Fe) tablet Generic drug: ferric citrate Take 420 mg by mouth 3 (three) times daily.   calcium acetate 667 MG capsule Commonly known as: PHOSLO Take 2,001 mg by mouth 3 (three) times daily.   cefTAZidime IVPB Commonly known as: FORTAZ Inject 1 g into the vein every Tuesday AND 1 g every Thursday AND 2 g every Saturday. Do all this for 20 days. Indication:  Osteomyelitis First Dose: Yes Last Day of Therapy:  02/19/23 Labs - Once weekly:  CBC/D and CMP, Labs - Once weekly: ESR and CRP Method of administration: IV Push Method of administration may be changed at the discretion of home  infusion pharmacist based upon assessment of the patient and/or caregiver's ability to self-administer the medication ordered.. Start taking on: January 30, 2023   clopidogrel 75 MG tablet Commonly known as: Plavix Take 1 tablet (75 mg total) by mouth daily.   hydrOXYzine 25 MG tablet Commonly known as: ATARAX Take 1 tablet by mouth 2 (two) times daily.   metroNIDAZOLE 500 MG tablet Commonly known as: Flagyl Take 1 tablet (500 mg total) by mouth 2 (two) times daily for 21 days.   multivitamin Tabs tablet Take 1 tablet by mouth at bedtime.   oxyCODONE-acetaminophen 10-325 MG tablet Commonly known as: Percocet Take 1 tablet by mouth every 4 (four) hours as needed for pain. What changed: Another medication with the same name was removed. Continue taking this medication, and follow the directions you see here.   pregabalin 25 MG capsule Commonly known as: LYRICA Take by mouth.    sildenafil 100 MG tablet Commonly known as: VIAGRA Take 100 mg by mouth daily as needed for erectile dysfunction.   zinc sulfate 220 (50 Zn) MG capsule Take 1 capsule (220 mg total) by mouth daily.               Durable Medical Equipment  (From admission, onward)           Start     Ordered   01/29/23 1145  For home use only DME 3 n 1  Once        01/29/23 1144   01/29/23 1145  For home use only DME Tub bench  Once        01/29/23 1144              Discharge Care Instructions  (From admission, onward)           Start     Ordered   01/29/23 0000  Discharge wound care:       Comments: Wound vac on right foot should be changed 3 times weekly.   01/29/23 0842   01/29/23 0000  Change dressing on IV access line weekly and PRN  (Home infusion instructions - [facility] )        01/29/23 1141            Follow-up Information     Mick Sell, MD. Go on 02/02/2023.   Specialty: Infectious Diseases Why: @8 :15am Contact information: [address] [place] [address] [phone]         Gwyneth Revels, DPM. Go on 02/12/2023.   Specialty: Podiatry Why: @10 :45am Contact information: [address] [facility] Belva Crome [place] [address] [phone]                Discharge Exam: Filed Weights   01/25/23 1206 01/27/23 0811 01/27/23 1210  Weight: 121.3 kg 124.8 kg 122.3 kg   General exam: awake, alert, no acute distress, obese Respiratory system: on room air, normal respiratory effort. Cardiovascular system: RRR, no peripheral edema.   ] Central nervous system: A&O x3. no gross focal neurologic deficits, normal speech Extremities: wound vac to rigth foot, no edema, normal tone Skin: dry, intact, normal temperature Psychiatry: normal mood, congruent affect, judgement and insight appear normal   Condition at discharge: stable  The results of significant diagnostics from this  hospitalization (including imaging, microbiology, ancillary and laboratory) are listed below for reference.   Imaging Studies: PERIPHERAL VASCULAR CATHETERIZATION  Result Date: 01/24/2023 See surgical note for result.  MR FOOT RIGHT WO CONTRAST  Result Date: 01/21/2023 CLINICAL DATA:  Lateral foot wound. Soft tissue infection suspected. History of end-stage renal disease with foot pain, erythema and low level fever for 1 month. EXAM: MRI OF THE RIGHT FOREFOOT WITHOUT CONTRAST TECHNIQUE: Multiplanar, multisequence MR imaging of the right forefoot was performed. No intravenous contrast was administered. COMPARISON:  Radiographs 01/21/2023.  No other comparison studies. FINDINGS: Bones/Joint/Cartilage As seen on the earlier radiographs, there is soft tissue ulceration lateral to the base of the 5th metatarsal. There is underlying marrow T2 hyperintensity and T1 hypointensity laterally in the base of the 5th metatarsal, suspicious for osteomyelitis. This area is included on the coronal and sagittal images, but not fully covered on the transverse axial images. No other evidence of osteomyelitis within the forefoot. There are postsurgical changes from previous amputations through the head of the 1st proximal phalanx, at the 3rd proximal interphalangeal joint and at the 5th metatarsophalangeal joint. No significant joint effusions. Ligaments Intact Lisfranc ligament. The collateral ligaments of the remaining metatarsophalangeal joints appear intact. Muscles and Tendons Generalized forefoot muscular atrophy without focal fluid collection or significant tenosynovitis. Soft tissues As above, soft tissue ulceration lateral to the base of the 5th metatarsal with surrounding skin thickening and subcutaneous edema. No focal fluid collection is demonstrated. There is mild generalized subcutaneous edema throughout the dorsal and lateral aspect of the forefoot. IMPRESSION: 1. Soft tissue ulceration lateral to the base of  the 5th metatarsal with underlying marrow changes in the base of the 5th metatarsal suspicious for osteomyelitis. 2. No evidence of soft tissue abscess. 3. Postsurgical changes as described. Electronically Signed   By: Carey Bullocks M.D.   On: 01/21/2023 11:28   DG Foot 2 Views Right  Result Date: 01/21/2023 CLINICAL DATA:  Evaluate for osteomyelitis. Complains of right foot wound located on outside region of foot. EXAM: RIGHT FOOT - 2 VIEW COMPARISON:  None Available. FINDINGS: Status post first ray amputation at the head of the first proximal phalanx, third ray amputation at the PIP joint, and fifth ray amputation at the metatarsal phalangeal joint. Mild diffuse soft tissue edema. Superficial soft tissue wound with subcutaneous gas is noted adjacent to the base of the fifth metatarsal bone. There is no underlying fracture, dislocation or bony erosion. IMPRESSION: 1. Superficial soft tissue wound with subcutaneous gas adjacent to the base of the fifth metatarsal bone. 2. No radiographic evidence for osteomyelitis. 3. Status post first, third and fifth ray amputations. Electronically Signed   By: Signa Kell M.D.   On: 01/21/2023 05:57    Microbiology: Results for orders placed or performed during the hospital encounter of 01/21/23  Blood Culture (routine x 2)     Status: None   Collection Time: 01/21/23  5:08 AM   Specimen: BLOOD  Result Value Ref Range Status   Specimen Description BLOOD BLOOD RIGHT ARM  Final   Special Requests   Final    BOTTLES DRAWN AEROBIC AND ANAEROBIC Blood Culture results may not be optimal due to an inadequate volume of blood received in culture bottles   Culture   Final    NO GROWTH 5 DAYS Performed at [facility], [address] [place], [place] [address]    Report Status 01/26/2023 FINAL  Final  Blood Culture (routine x 2)     Status: None   Collection Time: 01/21/23  5:31 AM   Specimen: BLOOD  Result Value Ref Range Status   Specimen  Description BLOOD BLOOD LEFT ARM  Final   Special  Requests   Final    BOTTLES DRAWN AEROBIC AND ANAEROBIC Blood Culture results may not be optimal due to an inadequate volume of blood received in culture bottles   Culture   Final    NO GROWTH 5 DAYS Performed at [facility], [address] [place], [place] [address]    Report Status 01/26/2023 FINAL  Final  Aerobic/Anaerobic Culture w Gram Stain (surgical/deep wound)     Status: None (Preliminary result)   Collection Time: 01/23/23  1:13 PM   Specimen: Wound; Body Fluid  Result Value Ref Range Status   Specimen Description   Final    WOUND Performed at [facility], [address], [place], [place] [address]    Special Requests   Final    RIGHT 5TH METATARSEL ULCER Performed at [facility], [address] [place], [place] [address]    Gram Stain NO WBC SEEN NO ORGANISMS SEEN   Final   Culture   Final    RARE ERYSIPELOTHRIX RHUSIOPATHIAE Standardized susceptibility testing for this organism is not available. NO ANAEROBES ISOLATED Performed at [facility], [address] [place], [place] [address]    Report Status PENDING  Incomplete  Aerobic/Anaerobic Culture w Gram Stain (surgical/deep wound)     Status: None   Collection Time: 01/23/23  1:16 PM   Specimen: Wound  Result Value Ref Range Status   Specimen Description   Final    WOUND Performed at [facility], [address], [place], [place] [address]    Special Requests   Final    RIGHT 5TH MATATARSEL BONE Performed at [facility], [address] [place], [place] [address]    Gram Stain NO WBC SEEN NO ORGANISMS SEEN   Final   Culture   Final    No growth aerobically or anaerobically. Performed at [facility], [address] [place], [place] [address]    Report Status 01/29/2023 FINAL  Final    Labs: CBC: Recent Labs  Lab 01/23/23 0749 01/24/23 0458 01/25/23 0450  WBC 5.9 4.4 4.8   NEUTROABS 4.0 2.8 4.4  HGB 9.3* 9.2* 10.7*  HCT 27.9* 28.0* 32.5*  MCV 89.4 89.2 89.0  PLT 134* 130* 151   Basic Metabolic Panel: Recent Labs  Lab 01/24/23 0458 01/25/23 0450 01/26/23 0503 01/27/23 0548 01/28/23 0538  NA 136 137 137 137 137  K 4.6 5.7* 5.1 5.3* 4.6  CL 99 101 98 100 100  CO2 25 20* 24 18* 17*  GLUCOSE 102* 209* 162* 87 92  BUN 60* 84* 71* 94* 72*  CREATININE 12.31* 14.63* 10.57* 12.92* 10.26*  CALCIUM 6.6* 6.9* 6.8* 6.2* 6.8*  PHOS  --   --  4.4 7.9* 6.5*   Liver Function Tests: Recent Labs  Lab 01/26/23 0503 01/27/23 0548 01/28/23 0538  ALBUMIN 3.1* 3.3* 3.3*   CBG: Recent Labs  Lab 01/23/23 1612 01/23/23 2247 01/24/23 0830 01/24/23 1118 01/24/23 2103  GLUCAP 185* 105* 85 93 216*    Discharge time spent: less than 30 minutes.  Signed: Pennie Banter, DO [facility] 01/29/2023

## 2023-01-29 NOTE — Plan of Care (Signed)
AxO. RA. Medication compliant. Wound vac intact. Complaints of R ankle and leg pain. See MAR for intervention. CHG bath given. Falls precaution maintained. Bed in low locked position. Call bell within reach. Covid screen negative. Sepsis screen negative.     Problem: Education: Goal: Ability to describe self-care measures that may prevent or decrease complications (Diabetes Survival Skills Education) will improve Outcome: Adequate for Discharge Goal: Individualized Educational Video(s) Outcome: Adequate for Discharge   Problem: Coping: Goal: Ability to adjust to condition or change in health will improve Outcome: Adequate for Discharge   Problem: Fluid Volume: Goal: Ability to maintain a balanced intake and output will improve Outcome: Adequate for Discharge   Problem: Health Behavior/Discharge Planning: Goal: Ability to identify and utilize available resources and services will improve Outcome: Adequate for Discharge Goal: Ability to manage health-related needs will improve Outcome: Adequate for Discharge   Problem: Metabolic: Goal: Ability to maintain appropriate glucose levels will improve Outcome: Adequate for Discharge   Problem: Nutritional: Goal: Maintenance of adequate nutrition will improve Outcome: Adequate for Discharge Goal: Progress toward achieving an optimal weight will improve Outcome: Adequate for Discharge   Problem: Skin Integrity: Goal: Risk for impaired skin integrity will decrease Outcome: Adequate for Discharge   Problem: Tissue Perfusion: Goal: Adequacy of tissue perfusion will improve Outcome: Adequate for Discharge   Problem: Education: Goal: Knowledge of General Education information will improve Description: Including pain rating scale, medication(s)/side effects and non-pharmacologic comfort measures Outcome: Adequate for Discharge   Problem: Health Behavior/Discharge Planning: Goal: Ability to manage health-related needs will  improve Outcome: Adequate for Discharge   Problem: Clinical Measurements: Goal: Ability to maintain clinical measurements within normal limits will improve Outcome: Adequate for Discharge Goal: Will remain free from infection Outcome: Adequate for Discharge Goal: Diagnostic test results will improve Outcome: Adequate for Discharge   Problem: Activity: Goal: Risk for activity intolerance will decrease Outcome: Adequate for Discharge   Problem: Nutrition: Goal: Adequate nutrition will be maintained Outcome: Adequate for Discharge   Problem: Coping: Goal: Level of anxiety will decrease Outcome: Adequate for Discharge   Problem: Elimination: Goal: Will not experience complications related to bowel motility Outcome: Adequate for Discharge Goal: Will not experience complications related to urinary retention Outcome: Adequate for Discharge   Problem: Pain Managment: Goal: General experience of comfort will improve Outcome: Adequate for Discharge   Problem: Safety: Goal: Ability to remain free from injury will improve Outcome: Adequate for Discharge   Problem: Skin Integrity: Goal: Risk for impaired skin integrity will decrease Outcome: Adequate for Discharge   Problem: Health Behavior/Discharge Planning: Goal: Ability to manage health-related needs will improve Outcome: Adequate for Discharge   Problem: Metabolic: Goal: Ability to maintain appropriate glucose levels will improve Outcome: Adequate for Discharge   Problem: Tissue Perfusion: Goal: Adequacy of tissue perfusion will improve Outcome: Adequate for Discharge   Problem: Activity: Goal: Risk for activity intolerance will decrease Outcome: Adequate for Discharge

## 2023-01-29 NOTE — Progress Notes (Signed)
Date of Admission:  01/21/2023      ID: Patrick Downs is a 47 y.o. male Principal Problem:   Cellulitis Active Problems:   ESRD on dialysis (HCC)   GERD (gastroesophageal reflux disease)   Polyneuropathy, unspecified   Type 2 diabetes mellitus with peripheral neuropathy (HCC)   Ulcer of right foot (HCC)   Osteomyelitis of right foot (HCC)   PAD (peripheral artery disease) (HCC)    Subjective: No complaints  Medications:   (feeding supplement) PROSource Plus  30 mL Oral TID BM   vitamin C  500 mg Oral BID   Chlorhexidine Gluconate Cloth  6 each Topical Daily   heparin  5,000 Units Subcutaneous Q8H   metroNIDAZOLE  500 mg Oral Q12H   multivitamin  1 tablet Oral QHS   nutrition supplement (JUVEN)  1 packet Oral BID BM   sodium chloride flush  3 mL Intravenous Q12H   zinc sulfate  220 mg Oral Daily    Objective: Vital signs in last 24 hours: Patient Vitals for the past 24 hrs:  BP Temp Temp src Pulse Resp SpO2  01/29/23 0751 116/68 (!) 97.4 F (36.3 C) -- 66 19 94 %  01/29/23 0451 132/83 97.7 F (36.5 C) Oral 71 18 99 %  01/28/23 2005 119/82 (!) 97.5 F (36.4 C) Oral 78 -- 95 %  01/28/23 1638 104/79 98.7 F (37.1 C) -- 66 15 100 %    Dialysis cath rt neck area  PHYSICAL EXAM:  General: Alert, cooperative, no distress, appears stated age.  Lungs: Clear to auscultation bilaterally. No Wheezing or Rhonchi. No rales. Heart: Regular rate and rhythm, no murmur, rub or gallop. Abdomen: Soft, non-tender,not distended. Bowel sounds normal. No masses Extremities:  01/25/23  01/22/23     Skin: No rashes or lesions. Or bruising Lymph: Cervical, supraclavicular normal. Neurologic: contractures hands  Lab Results    Latest Ref Rng & Units 01/25/2023    4:50 AM 01/24/2023    4:58 AM 01/23/2023    7:49 AM  CBC  WBC 4.0 - 10.5 K/uL 4.8  4.4  5.9   Hemoglobin 13.0 - 17.0 g/dL 19.1  9.2  9.3   Hematocrit 39.0 - 52.0 % 32.5  28.0  27.9   Platelets 150 - 400 K/uL 151   130  134        Latest Ref Rng & Units 01/28/2023    5:38 AM 01/27/2023    5:48 AM 01/26/2023    5:03 AM  CMP  Glucose 70 - 99 mg/dL 92  87  478   BUN 6 - 20 mg/dL 72  94  71   Creatinine 0.61 - 1.24 mg/dL 29.56  21.30  86.57   Sodium 135 - 145 mmol/L 137  137  137   Potassium 3.5 - 5.1 mmol/L 4.6  5.3  5.1   Chloride 98 - 111 mmol/L 100  100  98   CO2 22 - 32 mmol/L 17  18  24    Calcium 8.9 - 10.3 mg/dL 6.8  6.2  6.8       Microbiology: Foot culture erysipelothrix   Assessment/Plan: DM with DFI Rt gangrenous ulcer s/p debridement and  bone culture sent Pt currently on cefazolin + flagyl Now lab reporting erysipelothrix- no specific risks he does fish but not in months Asked lab to reconfirm the bacteria  Pathology showed focal chronic osteomyelitis  3rd generation cephalosporin done during dialysis- like IV ceftadime until 10/28 + PO flagyl  PAD s/p stent to Rt popliteal   ESRD on dialysis Multiple failed AVF Now has permacath   H/o GBS with neurological deficit  Discussed the management with the patient and his partner and care team Will follow him as OP [employer] [phone]  Gavin Pound - will discuss with them regarding  ABX

## 2023-01-29 NOTE — Progress Notes (Signed)
OT Cancellation Note  Patient Details Name: NATHANUEL CABREJA MRN: [id number] DOB: 12/22/1975   Cancelled Treatment:    Reason Eval/Treat Not Completed: Other (comment) OT attempted tx session this date with pt and significant other present. Pt declined need for treatment, questions were answered by OT and appropriate DME delivered for pt to DC home. Re-educated on NWB status for healing.  Constance Goltz 01/29/2023, 4:51 PM

## 2023-01-30 DIAGNOSIS — E1140 Type 2 diabetes mellitus with diabetic neuropathy, unspecified: Secondary | ICD-10-CM | POA: Diagnosis not present

## 2023-01-30 DIAGNOSIS — N186 End stage renal disease: Secondary | ICD-10-CM | POA: Diagnosis not present

## 2023-01-30 DIAGNOSIS — M869 Osteomyelitis, unspecified: Secondary | ICD-10-CM | POA: Diagnosis not present

## 2023-01-30 DIAGNOSIS — Z992 Dependence on renal dialysis: Secondary | ICD-10-CM | POA: Diagnosis not present

## 2023-01-30 LAB — CBC
HCT: 32.3 % — ABNORMAL LOW (ref 39.0–52.0)
Hemoglobin: 10.3 g/dL — ABNORMAL LOW (ref 13.0–17.0)
MCH: 28.8 pg (ref 26.0–34.0)
MCHC: 31.9 g/dL (ref 30.0–36.0)
MCV: 90.2 fL (ref 80.0–100.0)
Platelets: 179 10*3/uL (ref 150–400)
RBC: 3.58 MIL/uL — ABNORMAL LOW (ref 4.22–5.81)
RDW: 14.6 % (ref 11.5–15.5)
WBC: 6.3 10*3/uL (ref 4.0–10.5)
nRBC: 0 % (ref 0.0–0.2)

## 2023-01-30 LAB — RENAL FUNCTION PANEL
Albumin: 3.4 g/dL — ABNORMAL LOW (ref 3.5–5.0)
Anion gap: 21 — ABNORMAL HIGH (ref 5–15)
BUN: 138 mg/dL — ABNORMAL HIGH (ref 6–20)
CO2: 21 mmol/L — ABNORMAL LOW (ref 22–32)
Calcium: 6.2 mg/dL — CL (ref 8.9–10.3)
Chloride: 99 mmol/L (ref 98–111)
Creatinine, Ser: 15.63 mg/dL — ABNORMAL HIGH (ref 0.61–1.24)
GFR, Estimated: 3 mL/min — ABNORMAL LOW (ref >60)
Glucose, Bld: 102 mg/dL — ABNORMAL HIGH (ref 70–99)
Phosphorus: 9.5 mg/dL — ABNORMAL HIGH (ref 2.5–4.6)
Potassium: 5.1 mmol/L (ref 3.5–5.1)
Sodium: 141 mmol/L (ref 135–145)

## 2023-01-30 MED ORDER — SODIUM CHLORIDE 0.9 % IV SOLN
1.0000 g | INTRAVENOUS | Status: AC
  Administered 2023-01-30: 1 g via INTRAVENOUS
  Filled 2023-01-30: qty 1

## 2023-01-30 MED ORDER — CLOPIDOGREL BISULFATE 75 MG PO TABS
75.0000 mg | ORAL_TABLET | Freq: Every day | ORAL | Status: DC
  Administered 2023-01-30: 75 mg via ORAL
  Filled 2023-01-30: qty 1

## 2023-01-30 MED ORDER — ASPIRIN 81 MG PO TBEC
81.0000 mg | DELAYED_RELEASE_TABLET | Freq: Every day | ORAL | Status: DC
  Administered 2023-01-30: 81 mg via ORAL
  Filled 2023-01-30: qty 1

## 2023-01-30 MED ORDER — HEPARIN SODIUM (PORCINE) 1000 UNIT/ML IJ SOLN
INTRAMUSCULAR | Status: AC
  Filled 2023-01-30: qty 10

## 2023-01-30 NOTE — Progress Notes (Signed)
[facility]  ROUNDING NOTE   Subjective:   Patient seen and evaluated during dialysis   HEMODIALYSIS FLOWSHEET:  Blood Flow Rate (mL/min): 400 mL/min Arterial Pressure (mmHg): -187.46 mmHg Venous Pressure (mmHg): 199.38 mmHg TMP (mmHg): 12.32 mmHg Ultrafiltration Rate (mL/min): 971 mL/min Dialysate Flow Rate (mL/min): 299 ml/min Dialysis Fluid Bolus: Normal Saline  Tolerating treatment well Denies pain  NPWV in place   Objective:  Vital signs in last 24 hours:  Temp:  [97.6 F (36.4 C)-98.5 F (36.9 C)] 97.6 F (36.4 C) (10/08 0840) Pulse Rate:  [65-79] 71 (10/08 0930) Resp:  [11-18] 11 (10/08 0930) BP: (115-145)/(81-107) 145/107 (10/08 0930) SpO2:  [94 %-99 %] 95 % (10/08 0930) Weight:  [126.6 kg] 126.6 kg (10/08 0840)  Weight change:  Filed Weights   01/27/23 0811 01/27/23 1210 01/30/23 0840  Weight: 124.8 kg 122.3 kg 126.6 kg    Intake/Output: I/O last 3 completed shifts: In: 340 [P.O.:240; IV Piggyback:100] Out: -    Intake/Output this shift:  No intake/output data recorded.  Physical Exam: General: No acute distress  Head: Normocephalic, atraumatic. Moist oral mucosal membranes  Lungs:  Clear to auscultation, normal effort  Heart: S1S2 no rubs  Abdomen:  Soft, nontender, bowel sounds present  Extremities: No peripheral edema.RLE NPWV  Neurologic: Awake, alert, following commands  Skin: No acute rash, right foot surgical dressing  Access: Right IJ PermCath    Basic Metabolic Panel: Recent Labs  Lab 01/24/23 0458 01/25/23 0450 01/26/23 0503 01/27/23 0548 01/28/23 0538  NA 136 137 137 137 137  K 4.6 5.7* 5.1 5.3* 4.6  CL 99 101 98 100 100  CO2 25 20* 24 18* 17*  GLUCOSE 102* 209* 162* 87 92  BUN 60* 84* 71* 94* 72*  CREATININE 12.31* 14.63* 10.57* 12.92* 10.26*  CALCIUM 6.6* 6.9* 6.8* 6.2* 6.8*  PHOS  --   --  4.4 7.9* 6.5*    Liver Function Tests: Recent Labs  Lab 01/26/23 0503 01/27/23 0548 01/28/23 0538  ALBUMIN  3.1* 3.3* 3.3*   No results for input(s): "LIPASE", "AMYLASE" in the last 168 hours. No results for input(s): "AMMONIA" in the last 168 hours.  CBC: Recent Labs  Lab 01/24/23 0458 01/25/23 0450 01/30/23 0916  WBC 4.4 4.8 6.3  NEUTROABS 2.8 4.4  --   HGB 9.2* 10.7* 10.3*  HCT 28.0* 32.5* 32.3*  MCV 89.2 89.0 90.2  PLT 130* 151 179    Cardiac Enzymes: No results for input(s): "CKTOTAL", "CKMB", "CKMBINDEX", "TROPONINI" in the last 168 hours.  BNP: Invalid input(s): "POCBNP"  CBG: Recent Labs  Lab 01/23/23 1612 01/23/23 2247 01/24/23 0830 01/24/23 1118 01/24/23 2103  GLUCAP 185* 105* 85 93 216*    Microbiology: Results for orders placed or performed during the hospital encounter of 01/21/23  Blood Culture (routine x 2)     Status: None   Collection Time: 01/21/23  5:08 AM   Specimen: BLOOD  Result Value Ref Range Status   Specimen Description BLOOD BLOOD RIGHT ARM  Final   Special Requests   Final    BOTTLES DRAWN AEROBIC AND ANAEROBIC Blood Culture results may not be optimal due to an inadequate volume of blood received in culture bottles   Culture   Final    NO GROWTH 5 DAYS Performed at [facility], [address] [place], [place] [address]    Report Status 01/26/2023 FINAL  Final  Blood Culture (routine x 2)     Status: None   Collection  Time: 01/21/23  5:31 AM   Specimen: BLOOD  Result Value Ref Range Status   Specimen Description BLOOD BLOOD LEFT ARM  Final   Special Requests   Final    BOTTLES DRAWN AEROBIC AND ANAEROBIC Blood Culture results may not be optimal due to an inadequate volume of blood received in culture bottles   Culture   Final    NO GROWTH 5 DAYS Performed at [facility], [address], [place], [place] [address]    Report Status 01/26/2023 FINAL  Final  Aerobic/Anaerobic Culture w Gram Stain (surgical/deep wound)     Status: None (Preliminary result)   Collection Time: 01/23/23  1:13 PM   Specimen: Wound;  Body Fluid  Result Value Ref Range Status   Specimen Description   Final    WOUND Performed at [facility], [address], [place], [place] [address]    Special Requests   Final    RIGHT 5TH METATARSEL ULCER Performed at [facility], [address] [place], [place] [address]    Gram Stain NO WBC SEEN NO ORGANISMS SEEN   Final   Culture   Final    RARE ERYSIPELOTHRIX RHUSIOPATHIAE Standardized susceptibility testing for this organism is not available. NO ANAEROBES ISOLATED Performed at [facility], [address], [place], [place] [address]    Report Status PENDING  Incomplete  Aerobic/Anaerobic Culture w Gram Stain (surgical/deep wound)     Status: None   Collection Time: 01/23/23  1:16 PM   Specimen: Wound  Result Value Ref Range Status   Specimen Description   Final    WOUND Performed at [facility], [address] [place], [place] [address]    Special Requests   Final    RIGHT 5TH MATATARSEL BONE Performed at [facility], [address] [place], [place] [address]    Gram Stain NO WBC SEEN NO ORGANISMS SEEN   Final   Culture   Final    No growth aerobically or anaerobically. Performed at [facility], [address] [place], [place] [address]    Report Status 01/29/2023 FINAL  Final    Coagulation Studies: No results for input(s): "LABPROT", "INR" in the last 72 hours.   Urinalysis: No results for input(s): "COLORURINE", "LABSPEC", "PHURINE", "GLUCOSEU", "HGBUR", "BILIRUBINUR", "KETONESUR", "PROTEINUR", "UROBILINOGEN", "NITRITE", "LEUKOCYTESUR" in the last 72 hours.  Invalid input(s): "APPERANCEUR"    Imaging: No results found.   Medications:    sodium chloride     sodium chloride 250 mL/hr at 01/27/23 0556    (feeding supplement) PROSource Plus  30 mL Oral TID BM   vitamin C  500 mg Oral BID   aspirin EC  81 mg Oral Daily   Chlorhexidine Gluconate Cloth  6 each Topical Daily   clopidogrel   75 mg Oral Daily   heparin  5,000 Units Subcutaneous Q8H   metroNIDAZOLE  500 mg Oral Q12H   multivitamin  1 tablet Oral QHS   nutrition supplement (JUVEN)  1 packet Oral BID BM   sodium chloride flush  3 mL Intravenous Q12H   zinc sulfate  220 mg Oral Daily   sodium chloride, acetaminophen, alteplase, heparin, hydrOXYzine, iodixanol, oxyCODONE-acetaminophen **AND** oxyCODONE, sodium chloride flush  Assessment/ Plan:  47 y.o. male ESRD on HD TTS, CHF, history of Guillain-Barr syndrome, hypertension, anemia of chronic kidney disease, secondary hyperparathyroidism, history of right foot toe amputation who presents with ulceration on lateral aspect of right foot.  [facility]  1.  ESRD on HD TTS.  Receiving treatment today, UF goal 2L as tolerated. Next treatment scheduled for Thursday.   2.  Anemia of chronic kidney disease.   Lab Results  Component Value Date   HGB 10.3 (L) 01/30/2023   Patient receives Mircera as outpatient.  Hemoglobin within optimal range. ESA held  3.  Secondary hyperparathyroidism.   Patient prescribed auryxia with meals outpatient   3 Calcium bath ordered with dialysis  4.  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  Currently on IV cefazolin and metronidazole.  Imaging suspicious for osteomyelitis.  Podiatry performed debridement on 01/23/23 with bone biopsy and placement on wound vac. Vascular performed angiogram on 01/24/23 with stent placement.   ID following and recommend Ceftazdime 1g with each dialysis treatment until 02/19/23. Outpatient clinic notified.     LOS: 9   10/8/202410:15 AM

## 2023-01-30 NOTE — Progress Notes (Signed)
PT Cancellation Note  Patient Details Name: Patrick Downs MRN: [id number] DOB: 03-Sep-1975   Cancelled Treatment:    Reason Eval/Treat Not Completed: Other (comment).  Pt currently off floor at dialysis (not available for PT session).  Will re-attempt PT session at a later date/time.  Hendricks Limes, PT 01/30/23, 11:08 AM

## 2023-01-30 NOTE — Progress Notes (Signed)
  Received patient in bed to unit.    Informed consent signed and in chart.    TX duration:  3.5 hours      Transported back to floor  Hand-off given to patient's nurse.    Access used: R jugular  Access issues: none   Total UF removed: 2.3L Medication(s) given: none Post HD VS: 124/75 Post HD weight: 1222.7kg      Olin Pia, LPN Kidney Dialysis Unit

## 2023-01-30 NOTE — Plan of Care (Signed)
Pt alert and oriented x4. Up with 1 assist to bathroom. Pain medication given x2 for 8/10 pain with positive effects. Pt down to dialysis this morning. IV removed intact. VSS. Discharge education completed by RN Earnie Larsson. Bed locked and in lowest position. Call bell in reach.   Pt awaiting ride for discharge at this time. All DME in room.     Problem: Increased Nutrient Needs (NI-5.1) Goal: Food and/or nutrient delivery Description: Individualized approach for food/nutrient provision. Outcome: Adequate for Discharge

## 2023-01-30 NOTE — TOC Transition Note (Signed)
Transition of Care [facility]) - CM/SW Discharge Note   Patient Details  Name: Patrick Downs MRN: [id number] Date of Birth: 1975/06/29  Transition of Care [facility]) CM/SW Contact:  Allena Katz, LCSW Phone Number: 01/30/2023, 3:04 PM   Clinical Narrative:   Pt discharging home with home health. Cheryl with [facility] notified and will be able to staff [facility] nursing for MWF wound vac changes. BSC and tub transfer bench ordered through [employer] and will be delivered to patients room as well as RW, Pt to get antibiotics at dialysis and does not need them at home.      Final next level of care: Home w Home Health Services Barriers to Discharge: Continued Medical Work up   Patient Goals and CMS Choice CMS Medicare.gov Compare Post Acute Care list provided to:: Patient Choice offered to / list presented to : Patient  Discharge Placement                         Discharge Plan and Services Additional resources added to the After Visit Summary for                    DME Agency: [employer]     Representative spoke with at DME Agency: tracy HH Arranged: RN   Date [facility] Agency Contacted: 01/24/23   Representative spoke with at [facility] Agency: cheryl  Social Determinants of Health (SDOH) Interventions SDOH Screenings   Food Insecurity: No Food Insecurity (09/11/2022)   Received from [facility], [facility]  Transportation Needs: No Transportation Needs (09/11/2022)   Received from [facility], [facility]  Depression [phone]): Low Risk  (06/27/2021)  Financial Resource Strain: Low Risk  (09/11/2022)   Received from [facility], [facility]  Social Connections: Unknown (09/11/2022)   Received from [facility], [facility]  Stress: No Stress Concern Present (09/11/2022)   Received from [facility], [facility]  Tobacco Use:  Low Risk  (12/22/2022)     Readmission Risk Interventions    01/22/2023   10:18 AM  Readmission Risk Prevention Plan  Transportation Screening Complete  PCP or Specialist Appt within 3-5 Days Complete  HRI or Home Care Consult Complete  Social Work Consult for Recovery Care Planning/Counseling Patient refused  Palliative Care Screening Not Applicable  Medication Review Oceanographer) Complete

## 2023-01-30 NOTE — Progress Notes (Signed)
OT Cancellation Note  Patient Details Name: Patrick Downs MRN: [id number] DOB: Apr 25, 1975   Cancelled Treatment:    Reason Eval/Treat Not Completed: Patient at procedure or test/ unavailable. Pt noted to be off the floor for hemodialysis, unavailable at this time. Will continue to follow POC at later date/time as pt available.    Constance Goltz 01/30/2023, 9:53 AM

## 2023-01-31 ENCOUNTER — Emergency Department
Admission: EM | Admit: 2023-01-31 | Discharge: 2023-02-02 | Disposition: A | Discharge status: Skilled Nursing Facility | Payer: 59 | Attending: Emergency Medicine | Admitting: Emergency Medicine

## 2023-01-31 DIAGNOSIS — Z992 Dependence on renal dialysis: Secondary | ICD-10-CM | POA: Diagnosis not present

## 2023-01-31 DIAGNOSIS — I739 Peripheral vascular disease, unspecified: Secondary | ICD-10-CM | POA: Diagnosis present

## 2023-01-31 DIAGNOSIS — M6289 Other specified disorders of muscle: Secondary | ICD-10-CM | POA: Diagnosis present

## 2023-01-31 DIAGNOSIS — E11621 Type 2 diabetes mellitus with foot ulcer: Secondary | ICD-10-CM | POA: Diagnosis not present

## 2023-01-31 DIAGNOSIS — E1122 Type 2 diabetes mellitus with diabetic chronic kidney disease: Secondary | ICD-10-CM | POA: Insufficient documentation

## 2023-01-31 DIAGNOSIS — E1140 Type 2 diabetes mellitus with diabetic neuropathy, unspecified: Secondary | ICD-10-CM | POA: Diagnosis not present

## 2023-01-31 DIAGNOSIS — Z7409 Other reduced mobility: Secondary | ICD-10-CM

## 2023-01-31 DIAGNOSIS — R29898 Other symptoms and signs involving the musculoskeletal system: Secondary | ICD-10-CM

## 2023-01-31 DIAGNOSIS — L97419 Non-pressure chronic ulcer of right heel and midfoot with unspecified severity: Secondary | ICD-10-CM

## 2023-01-31 DIAGNOSIS — N186 End stage renal disease: Secondary | ICD-10-CM

## 2023-01-31 DIAGNOSIS — M869 Osteomyelitis, unspecified: Secondary | ICD-10-CM | POA: Diagnosis present

## 2023-01-31 DIAGNOSIS — D638 Anemia in other chronic diseases classified elsewhere: Secondary | ICD-10-CM | POA: Diagnosis present

## 2023-01-31 DIAGNOSIS — M86171 Other acute osteomyelitis, right ankle and foot: Secondary | ICD-10-CM | POA: Diagnosis not present

## 2023-01-31 MED ORDER — OXYCODONE-ACETAMINOPHEN 5-325 MG PO TABS
2.0000 | ORAL_TABLET | Freq: Four times a day (QID) | ORAL | Status: DC | PRN
  Administered 2023-01-31 – 2023-02-02 (×5): 2 via ORAL
  Filled 2023-01-31 (×5): qty 2

## 2023-01-31 MED ORDER — METRONIDAZOLE 500 MG PO TABS
500.0000 mg | ORAL_TABLET | Freq: Two times a day (BID) | ORAL | Status: DC
  Administered 2023-01-31 – 2023-02-02 (×5): 500 mg via ORAL
  Filled 2023-01-31 (×5): qty 1

## 2023-01-31 MED ORDER — VITAMIN C 500 MG PO TABS
500.0000 mg | ORAL_TABLET | Freq: Two times a day (BID) | ORAL | Status: DC
  Administered 2023-01-31 – 2023-02-02 (×5): 500 mg via ORAL
  Filled 2023-01-31 (×5): qty 1

## 2023-01-31 MED ORDER — ALPHA-LIPOIC ACID 600 MG PO TABS: 1.0000 | ORAL_TABLET | Freq: Every day | ORAL | Status: DC

## 2023-01-31 MED ORDER — CLOPIDOGREL BISULFATE 75 MG PO TABS
75.0000 mg | ORAL_TABLET | Freq: Every day | ORAL | Status: DC
  Administered 2023-01-31 – 2023-02-02 (×3): 75 mg via ORAL
  Filled 2023-01-31 (×3): qty 1

## 2023-01-31 MED ORDER — SODIUM CHLORIDE 0.9 % IV SOLN
1.0000 g | INTRAVENOUS | Status: DC
  Administered 2023-02-01: 1 g via INTRAVENOUS
  Filled 2023-01-31: qty 1

## 2023-01-31 MED ORDER — PROSOURCE PLUS PO LIQD
30.0000 mL | Freq: Three times a day (TID) | ORAL | Status: DC
  Administered 2023-01-31 – 2023-02-01 (×3): 30 mL via ORAL
  Filled 2023-01-31 (×6): qty 30

## 2023-01-31 MED ORDER — SODIUM CHLORIDE 0.9 % IV SOLN: 1.0000 g | INTRAVENOUS | Status: DC

## 2023-01-31 MED ORDER — PREGABALIN 25 MG PO CAPS
25.0000 mg | ORAL_CAPSULE | Freq: Every day | ORAL | Status: DC
  Administered 2023-01-31 – 2023-02-02 (×3): 25 mg via ORAL
  Filled 2023-01-31 (×3): qty 1

## 2023-01-31 MED ORDER — JUVEN PO PACK: 1.0000 | PACK | Freq: Two times a day (BID) | ORAL | Status: DC

## 2023-01-31 MED ORDER — SODIUM CHLORIDE 0.9 % IV SOLN: 2.0000 g | INTRAVENOUS | Status: DC

## 2023-01-31 MED ORDER — ASPIRIN 81 MG PO TBEC
81.0000 mg | DELAYED_RELEASE_TABLET | Freq: Every day | ORAL | Status: DC
  Administered 2023-01-31 – 2023-02-02 (×3): 81 mg via ORAL
  Filled 2023-01-31 (×3): qty 1

## 2023-01-31 MED ORDER — INSULIN ASPART 100 UNIT/ML IJ SOLN: 0.0000 [IU] | Freq: Every day | INTRAMUSCULAR | Status: DC

## 2023-01-31 MED ORDER — HYDROXYZINE HCL 25 MG PO TABS
25.0000 mg | ORAL_TABLET | Freq: Two times a day (BID) | ORAL | Status: DC
  Administered 2023-01-31 – 2023-02-02 (×5): 25 mg via ORAL
  Filled 2023-01-31 (×5): qty 1

## 2023-01-31 MED ORDER — ATORVASTATIN CALCIUM 20 MG PO TABS
40.0000 mg | ORAL_TABLET | Freq: Every day | ORAL | Status: DC
  Administered 2023-01-31 – 2023-02-02 (×3): 40 mg via ORAL
  Filled 2023-01-31 (×3): qty 2

## 2023-01-31 MED ORDER — INSULIN ASPART 100 UNIT/ML IJ SOLN: 0.0000 [IU] | Freq: Three times a day (TID) | INTRAMUSCULAR | Status: DC

## 2023-01-31 MED ORDER — SODIUM CHLORIDE 0.9 % IV SOLN: 1.0000 g | Freq: Once | INTRAVENOUS | Status: DC

## 2023-01-31 MED ORDER — HEPARIN SODIUM (PORCINE) 5000 UNIT/ML IJ SOLN
5000.0000 [IU] | Freq: Three times a day (TID) | INTRAMUSCULAR | Status: DC
  Administered 2023-02-01 – 2023-02-02 (×3): 5000 [IU] via SUBCUTANEOUS
  Filled 2023-01-31 (×5): qty 1

## 2023-01-31 MED ORDER — RENA-VITE PO TABS
1.0000 | ORAL_TABLET | Freq: Every day | ORAL | Status: DC
  Administered 2023-01-31 – 2023-02-01 (×2): 1 via ORAL
  Filled 2023-01-31 (×2): qty 1

## 2023-01-31 NOTE — TOC Initial Note (Signed)
Transition of Care [facility]) - Initial/Assessment Note    Patient Details  Name: Patrick Downs MRN: [id number] Date of Birth: 1975/12/25  Transition of Care [facility]) CM/SW Contact:    Marquita Palms, LCSW Phone Number: 01/31/2023, 3:58 PM  Clinical Narrative:                  CSW met with patient at bedside. Patietn report he felt unsafe at home and needed was encouraged to come back to the hospital by his mother and a nurse. CSW called home health [facility] to confirm if they were able to start service. By recommendation of the MD and patient feeling unsafe to go home due to safety concern. CSW completed workup for SNF authorization.       Patient Goals and CMS Choice            Expected Discharge Plan and Services                                              Prior Living Arrangements/Services                       Activities of Daily Living      Permission Sought/Granted                  Emotional Assessment              Admission diagnosis:  Wound Check Patient Active Problem List   Diagnosis Date Noted   PAD (peripheral artery disease) (HCC) 01/29/2023   Osteomyelitis of right foot (HCC) 01/27/2023   Ulcer of right foot (HCC) 01/22/2023   Hypocalcemia 07/31/2022   V-tach (HCC) 07/31/2022   Weakness 07/31/2022   Cytopenia 07/31/2022   Troponin I above reference range 07/31/2022   Observed sleep apnea 07/17/2022   Acute bilateral low back pain without sciatica    Fluid overload 09/16/2021   Essential hypertension 09/16/2021   Acute midline low back pain without sciatica 09/16/2021   Hip pain, chronic, left 12/24/2020   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) 05/16/2020   Cellulitis 05/14/2020   Ulcer of great toe, right, with fat layer exposed (HCC) 02/13/2020   COVID-19 01/28/2020   Acute hypoxemic respiratory failure due to COVID-19 (HCC) 01/25/2020   Sepsis (HCC) 01/25/2020   Thrombocytopenia (HCC) 01/25/2020    Elevated troponin 01/25/2020   Peripheral neuropathy 01/25/2020   Other disorders of phosphorus metabolism 11/27/2019   Sepsis without acute organ dysfunction (HCC) 08/10/2019   Hypercalcemia 05/15/2019   Murmur 02/19/2019   GERD (gastroesophageal reflux disease) 03/01/2018   Degenerative disc disease, lumbar 01/09/2018   HTN (hypertension) 12/28/2017   Hyperkalemia 08/16/2017   Cellulitis of right toe 04/25/2017   Erectile dysfunction due to diseases classified elsewhere 05/31/2016   Awaiting organ transplant status 08/23/2015   Polyneuropathy, unspecified 08/02/2015   Anemia in ESRD (end-stage renal disease) (HCC) 08/02/2015   Gastroenteritis 08/02/2015   Heart failure, unspecified (HCC) 08/02/2015   Secondary hyperparathyroidism of renal origin (HCC) 08/02/2015   Bacterial infection, unspecified 06/29/2015   Unspecified open wound of right upper arm, initial encounter 06/29/2015   Encounter for fitting of gastrointestinal device 06/04/2015   Other specified local infections of the skin and subcutaneous tissue 05/20/2015   Idiopathic chronic hypotension 12/11/2014   Rectal pain 11/27/2014   Other specified diseases of  anus and rectum 11/27/2014   Unspecified protein-calorie malnutrition (HCC) 10/08/2014   Encounter for imaging study to confirm nasogastric (NG) tube placement    Encounter for nasogastric (NG) tube placement    Respiratory failure, acute (HCC)    Guillain Barr syndrome, lower ext weakness 08/27/2014   CAP (community acquired pneumonia) 08/23/2014   ESRD on dialysis (HCC) 08/23/2014   A-V fistula (HCC) 08/23/2014   ARDS (adult respiratory distress syndrome) (HCC) 08/23/2014   Septic shock (HCC) 08/23/2014   Hypotension 08/23/2014   Acute respiratory failure (HCC) 08/23/2014   Fever    Type 2 diabetes mellitus (HCC) 07/01/2014   Type 2 diabetes mellitus with peripheral neuropathy (HCC) 07/01/2014   Headache, unspecified 05/23/2014   Pruritus, unspecified  05/23/2014   Shortness of breath 05/23/2014   Iron deficiency anemia, unspecified 03/16/2014   Complication of vascular dialysis catheter 01/08/2014   Diarrhea, unspecified 01/08/2014   Pain, unspecified 01/08/2014   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, implants and grafts, sequela 01/08/2014   Anxiety disorder, unspecified 12/28/2013   Photophobia 12/11/2013   Spells 10/30/2013   Dizziness 10/30/2013   Other disorders of electrolyte and fluid balance, not elsewhere classified 09/16/2013   Other ulcerative colitis without complications (HCC) 06/11/2013   Coagulation defect, unspecified (HCC) 04/21/2013   Other allergy, initial encounter 04/21/2013   Other mechanical complication of surgically created arteriovenous fistula, initial encounter (HCC) 02/19/2013   Foot drop 12/09/2012   History of Guillain-Barre syndrome 12/09/2012   Other specified disorders of parathyroid gland (HCC) 05/15/2012   Dependence on renal dialysis (HCC) 11/29/2010   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   PCP:  Mick Sell, MD Pharmacy:   [facility] [id number] Cheree Ditto, [place] - [address] AT [facility] OF [address] [place] [address] Phone: [phone] Fax: [phone]     Social Determinants of Health (SDOH) Social History: SDOH Screenings   Food Insecurity: No Food Insecurity (09/11/2022)   Received from [facility], [facility]  Transportation Needs: No Transportation Needs (09/11/2022)   Received from [facility], [facility]  Depression [phone]): Low Risk  (06/27/2021)  Financial Resource Strain: Low Risk  (09/11/2022)   Received from [facility], [facility]  Social Connections: Unknown (09/11/2022)   Received from [facility], [facility]  Stress:  No Stress Concern Present (09/11/2022)   Received from [facility], [facility]  Tobacco Use: Low Risk  (01/31/2023)   SDOH Interventions:     Readmission Risk Interventions    01/22/2023   10:18 AM  Readmission Risk Prevention Plan  Transportation Screening Complete  PCP or Specialist Appt within 3-5 Days Complete  HRI or Home Care Consult Complete  Social Work Consult for Recovery Care Planning/Counseling Patient refused  Palliative Care Screening Not Applicable  Medication Review Oceanographer) Complete

## 2023-01-31 NOTE — ED Triage Notes (Signed)
Pt to ED from home via [facility], pt has wound vac to rt foot x 1 week. Pt has to be non weight bearing and has been using walker but walker is too large to use in the home. Pt and family to ED to get patient facility placement for rehab. Pt in NAD.

## 2023-01-31 NOTE — Progress Notes (Signed)
[facility]  ROUNDING NOTE   Subjective:   Patrick Downs was admitted to [facility] on 01/31/2023 for Wound Check  Last hemodialysis treatment was yesterday during his hospitalization.   Patient was discharged yesterday from [facility] for a hospitalization from 9/29 to 10/8 for right foot wound that required debridement of ulcer to the bone by Dr. Ether Griffins on 10/1 and angiogram with balloon angioplasty and stent placement by Dr. Wyn Quaker on 10/2. Patient was   Discharged with ceftazidine 1g with HD treatments until 10/28. Also with PO metronidazole.   Objective:  Vital signs in last 24 hours:  Temp:  [98.3 F (36.8 C)] 98.3 F (36.8 C) (10/09 1217) Pulse Rate:  [78] 78 (10/09 1217) Resp:  [16] 16 (10/09 1217) BP: (119)/(78) 119/78 (10/09 1217) SpO2:  [96 %] 96 % (10/09 1217) Weight:  [127 kg] 127 kg (10/09 1217)  Weight change:  Filed Weights   01/31/23 1217  Weight: 127 kg    Intake/Output: No intake/output data recorded.   Intake/Output this shift:  No intake/output data recorded.  Physical Exam: General: No acute distress  Head: Normocephalic, atraumatic. Moist oral mucosal membranes  Lungs:  Clear to auscultation, normal effort  Heart: S1S2 no rubs  Abdomen:  Soft, nontender, bowel sounds present  Extremities: No peripheral edema.RLE NPWV  Neurologic: Awake, alert, following commands  Skin: No acute rash, right foot surgical dressing  Access: Right IJ PermCath    Basic Metabolic Panel: Recent Labs  Lab 01/25/23 0450 01/26/23 0503 01/27/23 0548 01/28/23 0538 01/30/23 0916  NA 137 137 137 137 141  K 5.7* 5.1 5.3* 4.6 5.1  CL 101 98 100 100 99  CO2 20* 24 18* 17* 21*  GLUCOSE 209* 162* 87 92 102*  BUN 84* 71* 94* 72* 138*  CREATININE 14.63* 10.57* 12.92* 10.26* 15.63*  CALCIUM 6.9* 6.8* 6.2* 6.8* 6.2*  PHOS  --  4.4 7.9* 6.5* 9.5*    Liver Function Tests: Recent Labs  Lab 01/26/23 0503 01/27/23 0548 01/28/23 0538 01/30/23 0916  ALBUMIN 3.1*  3.3* 3.3* 3.4*   No results for input(s): "LIPASE", "AMYLASE" in the last 168 hours. No results for input(s): "AMMONIA" in the last 168 hours.  CBC: Recent Labs  Lab 01/25/23 0450 01/30/23 0916  WBC 4.8 6.3  NEUTROABS 4.4  --   HGB 10.7* 10.3*  HCT 32.5* 32.3*  MCV 89.0 90.2  PLT 151 179    Cardiac Enzymes: No results for input(s): "CKTOTAL", "CKMB", "CKMBINDEX", "TROPONINI" in the last 168 hours.  BNP: Invalid input(s): "POCBNP"  CBG: Recent Labs  Lab 01/24/23 2103  GLUCAP 216*    Microbiology: Results for orders placed or performed during the hospital encounter of 01/21/23  Blood Culture (routine x 2)     Status: None   Collection Time: 01/21/23  5:08 AM   Specimen: BLOOD  Result Value Ref Range Status   Specimen Description BLOOD BLOOD RIGHT ARM  Final   Special Requests   Final    BOTTLES DRAWN AEROBIC AND ANAEROBIC Blood Culture results may not be optimal due to an inadequate volume of blood received in culture bottles   Culture   Final    NO GROWTH 5 DAYS Performed at [facility], [address] [place], [place] [address]    Report Status 01/26/2023 FINAL  Final  Blood Culture (routine x 2)     Status: None   Collection Time: 01/21/23  5:31 AM   Specimen: BLOOD  Result Value Ref  Range Status   Specimen Description BLOOD BLOOD LEFT ARM  Final   Special Requests   Final    BOTTLES DRAWN AEROBIC AND ANAEROBIC Blood Culture results may not be optimal due to an inadequate volume of blood received in culture bottles   Culture   Final    NO GROWTH 5 DAYS Performed at [facility], [address], [place], [place] [address]    Report Status 01/26/2023 FINAL  Final  Aerobic/Anaerobic Culture w Gram Stain (surgical/deep wound)     Status: None (Preliminary result)   Collection Time: 01/23/23  1:13 PM   Specimen: Wound; Body Fluid  Result Value Ref Range Status   Specimen Description   Final    WOUND Performed at [facility],  [address], [place], [place] [address]    Special Requests   Final    RIGHT 5TH METATARSEL ULCER Performed at [facility], [address] [place], [place] [address]    Gram Stain NO WBC SEEN NO ORGANISMS SEEN   Final   Culture   Final    RARE ERYSIPELOTHRIX RHUSIOPATHIAE Sent to [facility] for further susceptibility testing. NO ANAEROBES ISOLATED Performed at [facility], [address], [place], [place] [address]    Report Status PENDING  Incomplete  Aerobic/Anaerobic Culture w Gram Stain (surgical/deep wound)     Status: None   Collection Time: 01/23/23  1:16 PM   Specimen: Wound  Result Value Ref Range Status   Specimen Description   Final    WOUND Performed at [facility], [address] [place], [place] [address]    Special Requests   Final    RIGHT 5TH MATATARSEL BONE Performed at [facility], [address] [place], [place] [address]    Gram Stain NO WBC SEEN NO ORGANISMS SEEN   Final   Culture   Final    No growth aerobically or anaerobically. Performed at [facility], [address] [place], [place] [address]    Report Status 01/29/2023 FINAL  Final    Coagulation Studies: No results for input(s): "LABPROT", "INR" in the last 72 hours.   Urinalysis: No results for input(s): "COLORURINE", "LABSPEC", "PHURINE", "GLUCOSEU", "HGBUR", "BILIRUBINUR", "KETONESUR", "PROTEINUR", "UROBILINOGEN", "NITRITE", "LEUKOCYTESUR" in the last 72 hours.  Invalid input(s): "APPERANCEUR"    Imaging: No results found.   Medications:    [START ON 02/01/2023] cefTAZidime (FORTAZ)  IV      (feeding supplement) PROSource Plus  30 mL Oral TID BM   ascorbic acid  500 mg Oral BID   aspirin EC  81 mg Oral Daily   atorvastatin  40 mg Oral Daily   clopidogrel  75 mg Oral Daily   hydrOXYzine  25 mg Oral BID   metroNIDAZOLE  500 mg Oral BID   multivitamin  1 tablet Oral QHS   nutrition supplement (JUVEN)  1 packet Oral BID BM    oxyCODONE-acetaminophen  Assessment/ Plan:  47 y.o. male ESRD on HD TTS, CHF, history of Guillain-Barr syndrome, hypertension, anemia of chronic kidney disease, secondary hyperparathyroidism, history of right foot toe amputation who presents with ulceration on lateral aspect of right foot.  [facility]  1.  ESRD on HD TTS.  No acute indication for dialysis today. Continue TTS schedule with dialysis for tomorrow.   2.  Anemia of chronic kidney disease.   Lab Results  Component Value Date   HGB 10.3 (L) 01/30/2023   Patient receives Mircera  as outpatient.     3.  Secondary hyperparathyroidism with hypocalcemia - auryxia with meals.   4.  Diabetes mellitus type II with chronic kidney disease and complication of right foot osteomyelitis: appreciate podiatry, vascular and ID input.  - continue ceftazidime 1g with HD treatments - metronidazole PO    LOS: 0 Banks Chaikin 10/9/20243:52 PM

## 2023-01-31 NOTE — Consult Note (Signed)
[facility] Consult note  ALMOND FRIES JYN:[id number] DOB: May 05, 1975 DOA: 01/31/2023 PCP: Mick Sell, MD  Presented from: Home Chief Complaint: Unable to take care of self  History of Present Illness: Patrick Downs is a 47 y.o. male with PMH significant for ESRD-HD-TTS, chronic hypotension, HLD, CHF, peripheral neuropathy, depression, diabetic foot ulcers Patient was discharged yesterday only from the hospital after foot hospitalization for surgery. 9/29, patient presented with right lower extremity cellulitis, ulcer, drainage.  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  He was admitted to [facility].  Seen by vascular surgery, podiatry and ID. 10/1, underwent excisional debridement to bone and biopsy of the fifth metatarsal by podiatry Dr. Ether Griffins.  Wound VAC was applied. 10/2, underwent revascularization attempt with angioplasty of right tibioperoneal trunk, posterior tibial artery, SFA by Dr. Wyn Quaker Per ID recommendation, patient was kept on broad-spectrum antibiotics.  PT eval was obtained.   10/8, patient was ultimately discharged home on oral Flagyl and IV ceftazidime 1 g with HD treatments until 10/28.  Patient lives at home alone.  Since the surgery, he is in nonweightbearing status and has wound VAC in place.  He has been trying to ambulate with a walker but the walker is too large for use in his home.  He could not take care of himself and hence came to the ED today seeking for rehab placement.  In the ED, patient was afebrile, hemodynamically stable Last set of labs from yesterday with unremarkable WBC, stable hemoglobin Social work in the ED started the process for SNF placement. EDP discussed with nephrologist Dr. Wynelle Link for maintenance hemodialysis Hospitalist service was consulted.  On my evaluation, I did not see any acute indication of inpatient admission and hence agreed to consultation while patient is waiting for SNF  placement from the ED.  At the time of my evaluation, patient was sitting up in recliner.  Not in distress.  No new symptoms.  Pain controlled. No family at side.  Wound VAC in place. History reviewed and detailed as above.  Review of Systems:  All systems were reviewed and were negative unless otherwise mentioned in the HPI   Past medical history: Past Medical History:  Diagnosis Date   CHF (congestive heart failure) (HCC)    Degenerative disc disease, lumbar    Depression    Dialysis patient (HCC)    ESRD (end stage renal disease) on dialysis (HCC)    Failure to thrive in adult    GERD (gastroesophageal reflux disease)    Gout    Guillain Barr syndrome (HCC)    Guillain Barr syndrome (HCC)    Heart murmur    Hip pain, chronic, left 12/24/2020   HTN (hypertension)    Hyperparathyroidism (HCC)    Kidney failure    MRSA (methicillin resistant staph aureus) culture positive    Peripheral neuropathy    Pneumonia    Renal insufficiency    Respiratory failure (HCC)     Past surgical history: Past Surgical History:  Procedure Laterality Date   A/V FISTULAGRAM Right 01/07/2018   Procedure: A/V FISTULAGRAM;  Surgeon: Annice Needy, MD;  Location: [facility] INVASIVE CV [facility];  Service: Cardiovascular;  Laterality: Right;   AMPUTATION TOE Right 06/08/2017   Procedure: AMPUTATION TOE RIGHT FIFTH TOE;  Surgeon: Gwyneth Revels, DPM;  Location: [facility] ORS;  Service: Podiatry;  Laterality: Right;   AMPUTATION TOE Left 05/17/2018   Procedure: RAY LEFT 5TH;  Surgeon: Gwyneth Revels, DPM;  Location: [facility] OR[facility];  Service: Podiatry;  Laterality: Left;   AMPUTATION TOE Right 03/05/2020   Procedure: AMPUTATION TOE IPJ X 2 RIGHT GREAT AND 3RD;  Surgeon: Gwyneth Revels, DPM;  Location: [facility] OR[facility];  Service: Podiatry;  Laterality: Right;   AV FISTULA PLACEMENT     x5      2 graphs   AV FISTULA PLACEMENT Right 04/08/2020   Procedure: INSERTION OF ARTERIOVENOUS (AV) GORE-TEX GRAFT ARM ( ARTEGRAFT);  Surgeon:  Annice Needy, MD;  Location: [facility] ORS;  Service: Vascular;  Laterality: Right;   DIALYSIS/PERMA CATHETER INSERTION N/A 01/20/2019   Procedure: DIALYSIS/PERMA CATHETER INSERTION;  Surgeon: Annice Needy, MD;  Location: [facility];  Service: Cardiovascular;  Laterality: N/A;   DIALYSIS/PERMA CATHETER REMOVAL N/A 06/17/2018   Procedure: DIALYSIS/PERMA CATHETER REMOVAL;  Surgeon: Annice Needy, MD;  Location: [facility];  Service: Cardiovascular;  Laterality: N/A;   IRRIGATION AND DEBRIDEMENT FOOT Right 01/23/2023   Procedure: IRRIGATION AND DEBRIDEMENT FOOT;  Surgeon: Gwyneth Revels, DPM;  Location: [facility] OR[facility];  Service: Orthopedics/Podiatry;  Laterality: Right;   LOWER EXTREMITY ANGIOGRAPHY Right 01/24/2023   Procedure: Lower Extremity Angiography;  Surgeon: Annice Needy, MD;  Location: [facility];  Service: Cardiovascular;  Laterality: Right;   PARATHYROIDECTOMY     PERIPHERAL VASCULAR THROMBECTOMY Right 01/22/2019   Procedure: PERIPHERAL VASCULAR THROMBECTOMY;  Surgeon: Annice Needy, MD;  Location: [facility];  Service: Cardiovascular;  Laterality: Right;   REMOVAL OF GRAFT Right 05/16/2020   Procedure: REMOVAL OF GRAFT;  Surgeon: Fransisco Hertz, MD;  Location: [facility];  Service: Vascular;  Laterality: Right;   RENAL BIOPSY     REVISON OF ARTERIOVENOUS FISTULA Right 02/07/2018   Procedure: REVISON OF ARTERIOVENOUS FISTULA;  Surgeon: Annice Needy, MD;  Location: [facility];  Service: Vascular;  Laterality: Right;   TEE WITHOUT CARDIOVERSION N/A 01/22/2018   Procedure: TRANSESOPHAGEAL ECHOCARDIOGRAM (TEE);  Surgeon: Laurier Nancy, MD;  Location: [facility];  Service: Cardiovascular;  Laterality: N/A;   tonsiilectomy     TONSILLECTOMY     tracheotomy     UPPER EXTREMITY VENOGRAPHY Bilateral 02/23/2020   Procedure: UPPER EXTREMITY VENOGRAPHY;  Surgeon: Annice Needy, MD;  Location: [facility];  Service: Cardiovascular;  Laterality: Bilateral;    Social  History:  reports that he has never smoked. He has never used smokeless tobacco. He reports current drug use. Drug: Marijuana. He reports that he does not drink alcohol.  Allergies:  Allergies  Allergen Reactions   Hepatitis B Vaccine     Other reaction(s): Unknown   Ondansetron Other (See Comments)    Stomach pain    Minoxidil Other (See Comments)    "put fluid around my heart", PERICARDIAL EFFUSION   Morphine And Codeine Other (See Comments)    Aggressive    Omnipaque [Iohexol] Itching and Other (See Comments)    Rigors on one occasion, widespread itching on a separate occasion (resolved with Benadryl), tremors   Hepatitis b vaccine, Ondansetron, Minoxidil, Morphine and codeine, and Omnipaque [iohexol]   Family history:  Family History  Problem Relation Age of Onset   Diabetes Mellitus II Father    Kidney disease Father    Kidney failure Paternal Grandfather    Prostate cancer Neg Hx    Kidney cancer Neg Hx    Bladder Cancer Neg Hx      Physical Exam: Vitals:   01/31/23 1217  BP: 119/78  Pulse: 78  Resp: 16  Temp: 98.3 F (36.8 C)  TempSrc: Oral  SpO2: 96%  Weight: 127 kg  Height: 6\' 2"  (1.88 m)   Wt Readings from Last 3 Encounters:  01/31/23 127 kg  01/30/23 122.7 kg  08/01/22 126.5 kg   Body mass index is 35.95 kg/m.  General exam: Pleasant young African-American male.  Not in pain Skin: No rashes, lesions or ulcers. HEENT: Atraumatic, normocephalic, no obvious bleeding Lungs: Clear to auscultation bilaterally CVS: Regular rate and rhythm, no murmur GI/Abd soft, nontender, nondistended, bowel sound present CNS: Alert, awake, oriented x 3 Psychiatry: Mood appropriate Extremities: No pedal edema, no calf tenderness.  Right foot remains bandaged.  Wound VAC in place.   ------------------------------------------------------------------------------------------------------ Assessment/Plan: Unable to take care of self Impaired mobility Patient lives at  home alone.  Since the surgery on 10/1, he is in nonweightbearing status and has wound VAC in place.  He has been trying to ambulate with a walker but the walker is too large for use in his home.  He could not take care of himself and hence came to the ED today seeking for rehab placement. Social work from ED started the process of SNF placement.  ESRD-HD-TTS H/o CHF chronic hypotension  Volume status, electrolytes and blood pressure currently stable. Nephrology consulted.  Recent right foot diabetic ulcer s/p debridement - 10/1 Wound VAC in place. Wound care to follow up Per ID recommendation at discharge yesterday, to continue oral Flagyl and cefazolin 1 g with HD treatment until 10/28.  Pharmacy consult requested  PAD S/p angioplasty of right tibioperoneal trunk, posterior tibial artery, SFA by Dr. Wyn Quaker PTA meds-aspirin, Plavix, statin Resume the same.  Type 2 diabetes mellitus A1c 6 on 01/21/2023 PTA meds-none??? Start SSI/Accu-Cheks Recent Labs  Lab 01/24/23 2103  GLUCAP 216*   Peripheral neuropathy Continue Lyrica  Chronic anemia GERD Hemoglobin slightly low but stable.  Continue PPI  Goals of care   Code Status: Full Code.  Confirmed with patient   DVT prophylaxis:  heparin injection 5,000 Units Start: 01/31/23 1645   Antimicrobials: IV ceftazidime, oral Flagyl Fluid: None Family Communication: None at bedside  Dispo: The patient is from: Home              Anticipated d/c is to: SNF  Diet: Diet Order             Diet renal with fluid restriction Fluid restriction: 1500 mL Fluid; Room service appropriate? Yes; Fluid consistency: Thin  Diet effective now                   Home Meds: Prior to Admission medications   Medication Sig Start Date End Date Taking? Authorizing Provider  acetaminophen (TYLENOL) 500 MG tablet Take 1,000-1,500 mg by mouth 2 (two) times daily as needed for moderate pain or headache.    [provider]  Alpha-Lipoic  Acid 600 MG TABS Take 1 tablet by mouth daily. 02/07/22 02/07/23  [provider]  ascorbic acid (VITAMIN C) 500 MG tablet Take 1 tablet (500 mg total) by mouth 2 (two) times daily. 01/29/23   Pennie Banter, DO  aspirin EC 81 MG tablet Take 1 tablet (81 mg total) by mouth daily. Swallow whole. 01/29/23 01/29/24  Esaw Grandchild A, DO  atorvastatin (LIPITOR) 40 MG tablet Take 1 tablet (40 mg total) by mouth daily. 01/29/23 01/29/24  Pennie Banter, DO  AURYXIA 1 GM 210 MG(Fe) tablet Take 420 mg by mouth 3 (three) times daily. Patient  not taking: Reported on 01/21/2023 11/28/19   [provider]  calcium acetate (PHOSLO) 667 MG capsule Take 2,001 mg by mouth 3 (three) times daily. 08/20/21   [provider]  cefTAZidime (FORTAZ) IVPB Inject 1 g into the vein every Tuesday AND 1 g every Thursday AND 2 g every Saturday. Do all this for 20 days. Indication:  Osteomyelitis First Dose: Yes Last Day of Therapy:  02/19/23 Labs - Once weekly:  CBC/D and CMP, Labs - Once weekly: ESR and CRP Method of administration: IV Push Method of administration may be changed at the discretion of home infusion pharmacist based upon assessment of the patient and/or caregiver's ability to self-administer the medication ordered.. 01/30/23 02/19/23  Esaw Grandchild A, DO  clopidogrel (PLAVIX) 75 MG tablet Take 1 tablet (75 mg total) by mouth daily. 01/29/23 01/29/24  Pennie Banter, DO  hydrOXYzine (ATARAX) 25 MG tablet Take 1 tablet by mouth 2 (two) times daily. 12/02/20   [provider]  metroNIDAZOLE (FLAGYL) 500 MG tablet Take 1 tablet (500 mg total) by mouth 2 (two) times daily for 21 days. 01/29/23 02/19/23  Esaw Grandchild A, DO  multivitamin (RENA-VIT) TABS tablet Take 1 tablet by mouth at bedtime. 01/29/23   Pennie Banter, DO  nutrition supplement, JUVEN, (JUVEN) PACK Take 1 packet by mouth 2 (two) times daily between meals. 01/29/23   Pennie Banter, DO  Nutritional Supplements  (,FEEDING SUPPLEMENT, PROSOURCE PLUS) liquid Take 30 mLs by mouth 3 (three) times daily between meals. 01/29/23   Pennie Banter, DO  oxyCODONE-acetaminophen (PERCOCET) 10-325 MG tablet Take 1 tablet by mouth every 4 (four) hours as needed for pain. 01/22/23 02/21/23  Yevette Edwards, MD  pregabalin (LYRICA) 25 MG capsule Take by mouth.    [provider]  sildenafil (VIAGRA) 100 MG tablet Take 100 mg by mouth daily as needed for erectile dysfunction. 08/20/21   [provider]  zinc sulfate 220 (50 Zn) MG capsule Take 1 capsule (220 mg total) by mouth daily. 01/29/23   Pennie Banter, DO    Labs on Admission:   CBC: Recent Labs  Lab 01/25/23 0450 01/30/23 0916  WBC 4.8 6.3  NEUTROABS 4.4  --   HGB 10.7* 10.3*  HCT 32.5* 32.3*  MCV 89.0 90.2  PLT 151 179    Basic Metabolic Panel: Recent Labs  Lab 01/25/23 0450 01/26/23 0503 01/27/23 0548 01/28/23 0538 01/30/23 0916  NA 137 137 137 137 141  K 5.7* 5.1 5.3* 4.6 5.1  CL 101 98 100 100 99  CO2 20* 24 18* 17* 21*  GLUCOSE 209* 162* 87 92 102*  BUN 84* 71* 94* 72* 138*  CREATININE 14.63* 10.57* 12.92* 10.26* 15.63*  CALCIUM 6.9* 6.8* 6.2* 6.8* 6.2*  PHOS  --  4.4 7.9* 6.5* 9.5*    Liver Function Tests: Recent Labs  Lab 01/26/23 0503 01/27/23 0548 01/28/23 0538 01/30/23 0916  ALBUMIN 3.1* 3.3* 3.3* 3.4*   No results for input(s): "LIPASE", "AMYLASE" in the last 168 hours. No results for input(s): "AMMONIA" in the last 168 hours.  Cardiac Enzymes: No results for input(s): "CKTOTAL", "CKMB", "CKMBINDEX", "TROPONINI" in the last 168 hours.  BNP (last 3 results) No results for input(s): "BNP" in the last 8760 hours.  ProBNP (last 3 results) No results for input(s): "PROBNP" in the last 8760 hours.  CBG: Recent Labs  Lab 01/24/23 2103  GLUCAP 216*    Lipase     Component Value Date/Time  LIPASE 37 09/16/2021 1923   LIPASE 104 (H) 08/11/2014 0413     Urinalysis No results found for:  "COLORURINE", "APPEARANCEUR", "LABSPEC", "PHURINE", "GLUCOSEU", "HGBUR", "BILIRUBINUR", "KETONESUR", "PROTEINUR", "UROBILINOGEN", "NITRITE", "LEUKOCYTESUR"   Drugs of Abuse  No results found for: "LABOPIA", "COCAINSCRNUR", "LABBENZ", "AMPHETMU", "THCU", "LABBARB"    Radiological Exams on Admission: No results found.   Signed, Lorin Glass, MD [facility] 01/31/2023

## 2023-01-31 NOTE — ED Notes (Addendum)
Patients mother at bedside, pt and mother requesting to speak with social work about pt's placement options for rehab. Pt given the social workers number to speak them and leave a message for call back.

## 2023-01-31 NOTE — ED Notes (Signed)
Patient refused blood sugar check

## 2023-01-31 NOTE — Progress Notes (Signed)
PHARMACIST - PHYSICIAN ORDER COMMUNICATION  CONCERNING: P&T Medication Policy on Herbal Medications  DESCRIPTION:  This patient's order for: alpha lipoic acid   has been noted.  This product(s) is classified as an "herbal" or natural product. Due to a lack of definitive safety studies or FDA approval, nonstandard manufacturing practices, plus the potential risk of unknown drug-drug interactions while on inpatient medications, the Pharmacy and Therapeutics Committee does not permit the use of "herbal" or natural products of this type within [facility].   ACTION TAKEN: The pharmacy department is unable to verify this order at this time and your patient has been informed of this safety policy. Please reevaluate patient's clinical condition at discharge and address if the herbal or natural product(s) should be resumed at that time.

## 2023-01-31 NOTE — ED Notes (Signed)
Patient's mother wanting to speak with pt's daughter. MD Melina Schools notified via secure chat.

## 2023-01-31 NOTE — Consult Note (Signed)
PHARMACY - BRIEF ANTIBIOTIC NOTE   Pharmacy has received consult(s) for ceftazidime from an ED provider. The patient's profile has been reviewed for ht/wt/allergies/indication/available labs.  One time order(s) placed for: order placed for ceftazidime 1 g for tomorrow, 10/10, in accordance with HD schedule and OPAT order from 10/7.  From 10/7 OPAT order: Ceftazidime 1 g IV on Tuesday, ceftazidime 1 g IV on Thursday, and ceftazidime 2 g IV on Saturday To end 02/19/2023   Further antibiotics/pharmacy consults should be ordered by admitting physician if indicated.                       Thank you,  Will M. Dareen Piano, PharmD Clinical Pharmacist 01/31/2023 2:24 PM

## 2023-01-31 NOTE — Consult Note (Incomplete)
Pharmacy Antibiotic Note  ASSESSMENT: 47 y.o. male with PMH ESRD-HD is presenting with cellulitis. Pharmacy has been consulted to manage *** dosing.  Patient measurements: Height: 6\' 2"  (188 cm) Weight: 127 kg (280 lb) IBW/kg (Calculated) : 82.2  Vital signs: Temp: 98.3 F (36.8 C) (10/09 1217) Temp Source: Oral (10/09 1217) BP: 119/78 (10/09 1217) Pulse Rate: 78 (10/09 1217) Recent Labs  Lab 01/25/23 0450 01/26/23 0503 01/27/23 0548 01/28/23 0538 01/30/23 0916  WBC 4.8  --   --   --  6.3  CREATININE 14.63*   < > 12.92* 10.26* 15.63*   < > = values in this interval not displayed.   Estimated Creatinine Clearance: 8.4 mL/min (A) (by C-G formula based on SCr of 15.63 mg/dL (H)).  Allergies: Allergies  Allergen Reactions   Hepatitis B Vaccine     Other reaction(s): Unknown   Ondansetron Other (See Comments)    Stomach pain    Minoxidil Other (See Comments)    "put fluid around my heart", PERICARDIAL EFFUSION   Morphine And Codeine Other (See Comments)    Aggressive    Omnipaque [Iohexol] Itching and Other (See Comments)    Rigors on one occasion, widespread itching on a separate occasion (resolved with Benadryl), tremors    Antimicrobials this admission: Ceftazidime 10/9 >>  Metronidazole 10/9 >>  Dose adjustments this admission: *** *** >> ***  Microbiology results: {CULTURE RESULTS:29681}  PLAN: Continue ceftazidime 1 g IV on Tuesdays and Thursdays and 2 g IV on Saturdays with dialysis through 02/19/2023 Continue metronidazole 500 mg PO twice daily through 02/19/2023 Follow up culture results to assess for antibiotic optimization. Monitor renal function to assess for any necessary antibiotic dosing changes.   Thank you for allowing pharmacy to be a part of this patient's care.  Will M. Dareen Piano, PharmD Clinical Pharmacist 01/31/2023 5:12 PM

## 2023-01-31 NOTE — Consult Note (Signed)
Pharmacy Antibiotic Note  ASSESSMENT: 47 y.o. male with PMH of ESRD on HD is presenting with cellulitis. Pharmacy has been consulted to manage ceftazidime and metronidazole dosing. Patient was recently hospitalized and discharged with OPAT regimen of ceftazidime 1 gram on Tuesdays and Thursdays and ceftazidime 2 gm on Saturdays along with metronidazole 500 mg PO BID to end 02/19/2023.   Patient measurements: Height: 6\' 2"  (188 cm) Weight: 127 kg (280 lb) IBW/kg (Calculated) : 82.2  Vital signs: Temp: 98.3 F (36.8 C) (10/09 1217) Temp Source: Oral (10/09 1217) BP: 119/78 (10/09 1217) Pulse Rate: 78 (10/09 1217)  Recent Labs  Lab 01/25/23 0450 01/26/23 0503 01/27/23 0548 01/28/23 0538 01/30/23 0916  WBC 4.8  --   --   --  6.3  CREATININE 14.63*   < > 12.92* 10.26* 15.63*   < > = values in this interval not displayed.   Estimated Creatinine Clearance: 8.4 mL/min (A) (by C-G formula based on SCr of 15.63 mg/dL (H)).  Allergies: Allergies  Allergen Reactions   Hepatitis B Vaccine     Other reaction(s): Unknown   Ondansetron Other (See Comments)    Stomach pain    Minoxidil Other (See Comments)    "put fluid around my heart", PERICARDIAL EFFUSION   Morphine And Codeine Other (See Comments)    Aggressive    Omnipaque [Iohexol] Itching and Other (See Comments)    Rigors on one occasion, widespread itching on a separate occasion (resolved with Benadryl), tremors   Antimicrobials this admission: Ceftazidime 10/9 >>  Metronidazole 10/9 >>  Dose adjustments this admission: N/A  PLAN: Continue ceftazidime 1 g IV on Tuesdays and Thursdays and 2 g IV on Saturdays with dialysis through 02/19/2023 Continue metronidazole 500 mg PO twice daily through 02/19/2023 Follow up culture results to assess for antibiotic optimization. Monitor renal function to assess for any necessary antibiotic dosing changes  Thank you for allowing pharmacy to be a part of this patient's care.  Littie Deeds, PharmD PGY1 Pharmacy Resident  01/31/2023 6:42 PM

## 2023-01-31 NOTE — ED Triage Notes (Signed)
Pt arrived via EMS from home d/t needing placement for rehab after being discharged from [facility] for debridement surgery and angiogram. Pt has wound vac now on the outside of his right foot. Pt unable to get around independently at home and still needs dialysis 3x a week.

## 2023-01-31 NOTE — ED Provider Notes (Signed)
[facility] Provider Note    Event Date/Time   First MD Initiated Contact with Patient 01/31/23 1355     (approximate)   History   Wound Check   HPI {Remember to add pertinent medical, surgical, social, and/or OB history to HPI:1} Patrick Downs is a 47 y.o. male  ***       Physical Exam   Triage Vital Signs: ED Triage Vitals [01/31/23 1217]  Encounter Vitals Group     BP 119/78     Systolic BP Percentile      Diastolic BP Percentile      Pulse Rate 78     Resp 16     Temp 98.3 F (36.8 C)     Temp Source Oral     SpO2 96 %     Weight 280 lb (127 kg)     Height 6\' 2"  (1.88 m)     Head Circumference      Peak Flow      Pain Score 0     Pain Loc      Pain Education      Exclude from Growth Chart     Most recent vital signs: Vitals:   01/31/23 1217  BP: 119/78  Pulse: 78  Resp: 16  Temp: 98.3 F (36.8 C)  SpO2: 96%    {Only need to document appropriate and relevant physical exam:1} General: Awake, no distress. *** CV:  Good peripheral perfusion. *** Resp:  Normal effort. *** Abd:  No distention. *** Other:  ***   ED Results / Procedures / Treatments   Labs (all labs ordered are listed, but only abnormal results are displayed) Labs Reviewed - No data to display   EKG  ***   RADIOLOGY *** {USE THE WORD "INTERPRETED"!! You MUST document your own interpretation of imaging, as well as the fact that you reviewed the radiologist's report!:1}   PROCEDURES:  Critical Care performed: {CriticalCareYesNo:19197::"Yes, see critical care procedure note(s)","No"}  Procedures   MEDICATIONS ORDERED IN ED: Medications  ascorbic acid (VITAMIN C) tablet 500 mg (has no administration in time range)  aspirin EC tablet 81 mg (has no administration in time range)  atorvastatin (LIPITOR) tablet 40 mg (has no administration in time range)  clopidogrel (PLAVIX) tablet 75 mg (has no administration in time range)  hydrOXYzine  (ATARAX) tablet 25 mg (has no administration in time range)  metroNIDAZOLE (FLAGYL) tablet 500 mg (has no administration in time range)  multivitamin (RENA-VIT) tablet 1 tablet (has no administration in time range)  nutrition supplement (JUVEN) (JUVEN) powder packet 1 packet (has no administration in time range)  (feeding supplement) PROSource Plus liquid 30 mL (has no administration in time range)  oxyCODONE-acetaminophen (PERCOCET/ROXICET) 5-325 MG per tablet 2 tablet (has no administration in time range)  cefTAZidime (FORTAZ) 1 g in sodium chloride 0.9 % 100 mL IVPB (has no administration in time range)     IMPRESSION / MDM / ASSESSMENT AND PLAN / ED COURSE  I reviewed the triage vital signs and the nursing notes.                              Differential diagnosis includes, but is not limited to, ***  Patient's presentation is most consistent with {EM COPA:27473}  *** {If the patient is on the monitor, remove the brackets and asterisks on the sentence below and remember to document it as a Procedure as well.  Otherwise delete the sentence below:1} {**The patient is on the cardiac monitor to evaluate for evidence of arrhythmia and/or significant heart rate changes.**} {Remember to include, when applicable, any/all of the following data: independent review of imaging independent review of labs (comment specifically on pertinent positives and negatives) review of specific prior hospitalizations, PCP/specialist notes, etc. discuss meds given and prescribed document any discussion with consultants (including hospitalists) any clinical decision tools you used and why (PECARN, NEXUS, etc.) did you consider admitting the patient? document social determinants of health affecting patient's care (homelessness, inability to follow up in a timely fashion, etc) document any pre-existing conditions increasing risk on current visit (e.g. diabetes and HTN increasing danger of high-risk chest  pain/ACS) describes what meds you gave (especially parenteral) and why any other interventions?:1} Clinical Course as of 01/31/23 1426  Wed Jan 31, 2023  1410 Dr. Ronn Melena notifed for nephrology consult and treat re: ESRD, IV antibiotics, etc. Also discussed with PharmD for dosing of antibiotics.  [MQ]    Clinical Course User Index [MQ] Sharyn Creamer, MD     FINAL CLINICAL IMPRESSION(S) / ED DIAGNOSES   Final diagnoses:  None     Rx / DC Orders   ED Discharge Orders     None        Note:  This document was prepared using Dragon voice recognition software and may include unintentional dictation errors.

## 2023-02-01 DIAGNOSIS — Z992 Dependence on renal dialysis: Secondary | ICD-10-CM

## 2023-02-01 DIAGNOSIS — D638 Anemia in other chronic diseases classified elsewhere: Secondary | ICD-10-CM

## 2023-02-01 DIAGNOSIS — E1140 Type 2 diabetes mellitus with diabetic neuropathy, unspecified: Secondary | ICD-10-CM

## 2023-02-01 DIAGNOSIS — I739 Peripheral vascular disease, unspecified: Secondary | ICD-10-CM

## 2023-02-01 DIAGNOSIS — M86171 Other acute osteomyelitis, right ankle and foot: Secondary | ICD-10-CM

## 2023-02-01 DIAGNOSIS — Z7409 Other reduced mobility: Secondary | ICD-10-CM

## 2023-02-01 DIAGNOSIS — N186 End stage renal disease: Secondary | ICD-10-CM

## 2023-02-01 DIAGNOSIS — M6289 Other specified disorders of muscle: Secondary | ICD-10-CM | POA: Diagnosis not present

## 2023-02-01 LAB — RENAL FUNCTION PANEL
Albumin: 3.5 g/dL (ref 3.5–5.0)
Anion gap: 19 — ABNORMAL HIGH (ref 5–15)
BUN: 99 mg/dL — ABNORMAL HIGH (ref 6–20)
CO2: 22 mmol/L (ref 22–32)
Calcium: 6.5 mg/dL — ABNORMAL LOW (ref 8.9–10.3)
Chloride: 96 mmol/L — ABNORMAL LOW (ref 98–111)
Creatinine, Ser: 14.6 mg/dL — ABNORMAL HIGH (ref 0.61–1.24)
GFR, Estimated: 4 mL/min — ABNORMAL LOW (ref >60)
Glucose, Bld: 106 mg/dL — ABNORMAL HIGH (ref 70–99)
Phosphorus: 9.8 mg/dL — ABNORMAL HIGH (ref 2.5–4.6)
Potassium: 4.8 mmol/L (ref 3.5–5.1)
Sodium: 137 mmol/L (ref 135–145)

## 2023-02-01 LAB — CBC
HCT: 31.7 % — ABNORMAL LOW (ref 39.0–52.0)
Hemoglobin: 10.2 g/dL — ABNORMAL LOW (ref 13.0–17.0)
MCH: 29.3 pg (ref 26.0–34.0)
MCHC: 32.2 g/dL (ref 30.0–36.0)
MCV: 91.1 fL (ref 80.0–100.0)
Platelets: 155 10*3/uL (ref 150–400)
RBC: 3.48 MIL/uL — ABNORMAL LOW (ref 4.22–5.81)
RDW: 14.6 % (ref 11.5–15.5)
WBC: 5.6 10*3/uL (ref 4.0–10.5)
nRBC: 0 % (ref 0.0–0.2)

## 2023-02-01 LAB — CBG MONITORING, ED: Glucose-Capillary: 113 mg/dL — ABNORMAL HIGH (ref 70–99)

## 2023-02-01 MED ORDER — PENTAFLUOROPROP-TETRAFLUOROETH EX AERO: 1.0000 | INHALATION_SPRAY | CUTANEOUS | Status: DC | PRN

## 2023-02-01 MED ORDER — CHLORHEXIDINE GLUCONATE CLOTH 2 % EX PADS
6.0000 | MEDICATED_PAD | Freq: Every day | CUTANEOUS | Status: DC
  Filled 2023-02-01 (×2): qty 6

## 2023-02-01 MED ORDER — LIDOCAINE-PRILOCAINE 2.5-2.5 % EX CREA: 1.0000 | TOPICAL_CREAM | CUTANEOUS | Status: DC | PRN

## 2023-02-01 MED ORDER — HEPARIN SODIUM (PORCINE) 1000 UNIT/ML DIALYSIS: 1000.0000 [IU] | INTRAMUSCULAR | Status: DC | PRN

## 2023-02-01 NOTE — Progress Notes (Signed)
Progress Note   Patient: Patrick Downs ATF:[id number] DOB: Oct 09, 1975 DOA: 01/31/2023     0 DOS: the patient was seen and examined on 02/01/2023   Brief hospital course: 47 year old man past medical history of end-stage renal disease on hemodialysis Tuesday Thursday Saturday, chronic hypotension, hyperlipidemia, congestive heart failure, peripheral neuropathy, depression, diabetic foot ulcers.  Patient was recently in the hospital for right lower extremity cellulitis found to have osteomyelitis of the fifth metatarsal and a gangrenous ulcer.  Patient had bone biopsy and wound VAC was applied.  Underwent revascularization on 10/2 by Dr. Wyn Quaker.  Patient on aspirin and Plavix.  Patient was discharged home on oral Flagyl and IV ceftazidime 1 g IV with hemodialysis treatments until 10/28.  Patient lives home alone and he is nonweightbearing of his right lower extremity and had a wound VAC.  He came back to the ER for placement.  Assessment and Plan: * Impaired mobility Patient unable to take care of himself at home being nonweightbearing of the right lower extremity and having a wound VAC in place.  Obtain PT and OT consultation.  TOC to try to set up placement and insurance authorization.  Osteomyelitis of right foot [facility]) Patient has osteomyelitis fifth metatarsal and got gangrenous ulcer.  Infectious disease recommended IV ceftazidime with dialysis until 10/28 and p.o. Flagyl.  Controlled type 2 diabetes mellitus with neuropathy (HCC) Hemoglobin A1c 6.0.  Patient on Lyrica  ESRD on dialysis [facility]) Had dialysis today with ceftazidime given.  Diabetic foot ulcer (HCC) Gangrenous foot ulcer.  Continue treatment of ceftazidime with dialysis and Flagyl.  PVD (peripheral vascular disease) (HCC) Continue aspirin and Plavix and statin  Anemia of chronic disease Hemoglobin 10.2.        Subjective: Patient feels okay.  Stated he could not take care of himself at home.  Came back to the hospital  for placement.  Physical Exam: Vitals:   02/01/23 1300 02/01/23 1320 02/01/23 1330 02/01/23 1340  BP: 104/65 124/79 120/79   Pulse: 78 80 86   Resp: 11 12 12    Temp:  98 F (36.7 C) 98 F (36.7 C)   TempSrc:  Oral    SpO2:  100% 100%   Weight:    125 kg  Height:       Physical Exam HENT:     Head: Normocephalic.     Mouth/Throat:     Pharynx: No oropharyngeal exudate.  Eyes:     General: Lids are normal.     Conjunctiva/sclera: Conjunctivae normal.  Cardiovascular:     Rate and Rhythm: Normal rate and regular rhythm.     Heart sounds: Normal heart sounds, S1 normal and S2 normal.  Pulmonary:     Breath sounds: No decreased breath sounds, wheezing, rhonchi or rales.  Abdominal:     Palpations: Abdomen is soft.     Tenderness: There is no abdominal tenderness.  Musculoskeletal:     Right lower leg: Swelling present.     Left lower leg: Swelling present.  Skin:    Comments: Right leg covered.  Neurological:     Mental Status: He is alert and oriented to person, place, and time.     Data Reviewed: White blood cell count 5.6, hemoglobin 10.2, platelet count 155, creatinine 14.6    Disposition: Case discussed with transitional care team.  They are working on placement.  Needed PT and OT notes and insurance authorization.  Case discussed with ED provider Dr. Scotty Court.  Currently the patient does not  have a medical reason for admission.  Just needs placement.  The patient can be placed from the ED once insurance authorization happens.  Hopefully tomorrow.  Patient already on a treatment protocol with IV ceftazidime with dialysis through 10/28 and oral Flagyl.  If there is a change in patient's status can re-consult the medicine team.  Will sign off at this time.  Case discussed with Dr. Scotty Court ED provider.    Planned Discharge Destination: Rehab    Time spent: 27 minutes  Author: Alford Highland, MD 02/01/2023 5:03 PM  For on call review [email or web address].

## 2023-02-01 NOTE — ED Notes (Addendum)
Talked with patient's family. They were asking if the patient would have to be in the hall overnight again. I informed them that it all depends on the department's needs. Right now we have 14 patients in the waiting room so as of now he would need to remain in the hall. Family states understanding. Pt sleeping in no acute distress.

## 2023-02-01 NOTE — Assessment & Plan Note (Signed)
Continue aspirin and Plavix and statin.

## 2023-02-01 NOTE — Assessment & Plan Note (Signed)
Patient has osteomyelitis fifth metatarsal and got gangrenous ulcer.  Infectious disease recommended IV ceftazidime with dialysis until 10/28 and p.o. Flagyl.

## 2023-02-01 NOTE — Assessment & Plan Note (Signed)
Had dialysis today with ceftazidime given.

## 2023-02-01 NOTE — Evaluation (Signed)
Physical Therapy Evaluation Patient Details Name: Patrick Downs MRN: [id number] DOB: [birth year] Today's Date: 02/01/2023  History of Present Illness  Pt is a 47 year old male presenting to the ED with difficulties caring for himself at home after discharge on 01/30/23    PMH signficiant for ESRD on HD, CHF, HTN, GBS, peripheral neuropathy, diabetes, recent osteomyelitis aortogram with selective right lower extremity angiogram with angioplasty and stent placement to the right distal SFA above-the-knee L popliteal artery stent.  Patient is also postoperative from excisional debridement of an ulcer to the bone of the right fifth metatarsal with placement of wound VAC and vancomycin antibiotic beads.    Clinical Impression  Pt sitting upright at EOB upon arrival as part of OT hand-off.  Pt tested for strength and found to have weakness in bilateral LE's, with increase weakness noted on the R LE.  Pt is able to perform STS and then ambulate forward 4' with hop to gait pattern x2, but fatigues quickly.  Pt noted that his living situation is not idea in that he is not able to navigate a walker through the doorways as easily and he also has carpet which makes it more difficult to manage a walker as well.  Due to living alone and inability to drive himself to appointments, pt would best be served with assistance at a SNF at this time.          If plan is discharge home, recommend the following: A little help with walking and/or transfers;A little help with bathing/dressing/bathroom;Assistance with cooking/housework;Assist for transportation;Help with stairs or ramp for entrance   Can travel by private vehicle        Equipment Recommendations Other (comment) (defer to next venue of care.)  Recommendations for Other Services       Functional Status Assessment Patient has had a recent decline in their functional status and demonstrates the ability to make significant improvements in function in a  reasonable and predictable amount of time.     Precautions / Restrictions Precautions Precautions: Fall Precaution Comments: B foot drop (AFO's); Restrictions Weight Bearing Restrictions: Yes RLE Weight Bearing: Non weight bearing Other Position/Activity Restrictions: NWB R foot; post-op I&D R foot wound      Mobility  Bed Mobility               General bed mobility comments: pt upright at EOB upon arrival with OT handoff.  Pt also returned to sitting EOB upon leaving the hallway.    Transfers Overall transfer level: Needs assistance Equipment used: Rolling walker (2 wheels) Transfers: Sit to/from Stand Sit to Stand: Min assist                Ambulation/Gait Ambulation/Gait assistance: Contact guard assist Gait Distance (Feet): 4 Feet Assistive device: Rolling walker (2 wheels)   Gait velocity: decreased     General Gait Details: NWB R LE; pt is steady controlled hop to gait pattern  Stairs            Wheelchair Mobility     Tilt Bed    Modified Rankin (Stroke Patients Only)       Balance Overall balance assessment: Needs assistance Sitting-balance support: Single extremity supported Sitting balance-Leahy Scale: Good     Standing balance support: Bilateral upper extremity supported, During functional activity Standing balance-Leahy Scale: Fair Standing balance comment: steady standing with B UE support on RW  Pertinent Vitals/Pain Pain Assessment Pain Assessment: No/denies pain    Home Living Family/patient expects to be discharged to:: Private residence Living Arrangements: Alone Available Help at Discharge: Friend(s);Available PRN/intermittently Type of Home: House Home Access: Stairs to enter Entrance Stairs-Rails: Left Entrance Stairs-Number of Steps: 5   Home Layout: One level Home Equipment: Grab bars - tub/shower;Other (comment);Rolling Walker (2 wheels);BSC/3in1 Additional  Comments: Has used a tub transfer bench before and knows how to use it, but does not currently have one.    Prior Function               Mobility Comments: amb with AFOs and no AD, no recent falls ADLs Comments: prior to previous admission was MOD I in ADL/IADL, recent difficulties with new NWBing status and RW use at home, no reported falls     Extremity/Trunk Assessment   Upper Extremity Assessment Upper Extremity Assessment: Overall WFL for tasks assessed (wasting noted throughout B hands, pt notes this is baseline from GBS)    Lower Extremity Assessment Lower Extremity Assessment: Generalized weakness;RLE deficits/detail RLE Deficits / Details: NWBing RLE with wound vac, R foot wrapped    Cervical / Trunk Assessment Cervical / Trunk Assessment: Normal  Communication   Communication Communication: No apparent difficulties Cueing Techniques: Verbal cues;Visual cues  Cognition Arousal: Alert Behavior During Therapy: WFL for tasks assessed/performed Overall Cognitive Status: Within Functional Limits for tasks assessed                                          General Comments General comments (skin integrity, edema, etc.): R foot wrapped, wound vac appears functioning pre/potst session    Exercises     Assessment/Plan    PT Assessment Patient needs continued PT services  PT Problem List Decreased strength;Decreased activity tolerance;Decreased balance;Decreased mobility;Decreased knowledge of use of DME;Decreased knowledge of precautions;Decreased skin integrity;Pain       PT Treatment Interventions DME instruction;Gait training;Stair training;Functional mobility training;Therapeutic activities;Therapeutic exercise;Balance training;Patient/family education    PT Goals (Current goals can be found in the Care Plan section)  Acute Rehab PT Goals Patient Stated Goal: to improve mobility at rehab facility PT Goal Formulation: With patient Time For  Goal Achievement: 02/15/23 Potential to Achieve Goals: Good    Frequency Min 1X/week     Co-evaluation               AM-PAC PT "6 Clicks" Mobility  Outcome Measure Help needed turning from your back to your side while in a flat bed without using bedrails?: A Little Help needed moving from lying on your back to sitting on the side of a flat bed without using bedrails?: A Little Help needed moving to and from a bed to a chair (including a wheelchair)?: A Lot Help needed standing up from a chair using your arms (e.g., wheelchair or bedside chair)?: A Little Help needed to walk in hospital room?: A Lot Help needed climbing 3-5 steps with a railing? : Total 6 Click Score: 14    End of Session Equipment Utilized During Treatment: Gait belt Activity Tolerance: Patient tolerated treatment well Patient left: in bed;with call bell/phone within reach;with bed alarm set (seated on EOB.) Nurse Communication: Mobility status;Precautions PT Visit Diagnosis: Unsteadiness on feet (R26.81);Other abnormalities of gait and mobility (R26.89);Muscle weakness (generalized) (M62.81);Pain Pain - Right/Left: Right Pain - part of body: Ankle and joints of foot  Time: 1308-6578 PT Time Calculation (min) (ACUTE ONLY): 14 min   Charges:   PT Evaluation $PT Eval Low Complexity: 1 Low   PT General Charges $$ ACUTE PT VISIT: 1 Visit         Nolon Bussing, PT, DPT Physical Therapist - [facility]  02/01/23, 5:05 PM

## 2023-02-01 NOTE — [place] FL2 (Signed)
[place] MEDICAID FL2 LEVEL OF CARE FORM     IDENTIFICATION  Patient Name: Patrick Downs Birthdate: 21-Jan-1976 Sex: male Admission Date (Current Location): 01/31/2023  [facility] and [place] Number:  Chiropodist and Address:  [facility], [address] [place], [place] [address]      Provider Number: [id number]  Attending Physician Name and Address:  No att. providers found  Relative Name and Phone Number:  Orvan Seen (Mother)  [phone] [facility])    Current Level of Care: Hospital Recommended Level of Care: Skilled Nursing Facility Prior Approval Number:    Date Approved/Denied:   PASRR Number: [id number]  Discharge Plan: SNF    Current Diagnoses: Patient Active Problem List   Diagnosis Date Noted   Diabetic foot ulcer (HCC) 01/31/2023   PAD (peripheral artery disease) (HCC) 01/29/2023   Osteomyelitis of right foot (HCC) 01/27/2023   Ulcer of right foot (HCC) 01/22/2023   Hypocalcemia 07/31/2022   V-tach (HCC) 07/31/2022   Weakness 07/31/2022   Cytopenia 07/31/2022   Troponin I above reference range 07/31/2022   Observed sleep apnea 07/17/2022   Acute bilateral low back pain without sciatica    Fluid overload 09/16/2021   Essential hypertension 09/16/2021   Acute midline low back pain without sciatica 09/16/2021   Hip pain, chronic, left 12/24/2020   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) 05/16/2020   Cellulitis 05/14/2020   Ulcer of great toe, right, with fat layer exposed (HCC) 02/13/2020   COVID-19 01/28/2020   Acute hypoxemic respiratory failure due to COVID-19 (HCC) 01/25/2020   Sepsis (HCC) 01/25/2020   Thrombocytopenia (HCC) 01/25/2020   Elevated troponin 01/25/2020   Peripheral neuropathy 01/25/2020   Other disorders of phosphorus metabolism 11/27/2019   Sepsis without acute organ dysfunction (HCC) 08/10/2019   Hypercalcemia 05/15/2019   Murmur 02/19/2019   GERD (gastroesophageal reflux disease) 03/01/2018    Degenerative disc disease, lumbar 01/09/2018   HTN (hypertension) 12/28/2017   Hyperkalemia 08/16/2017   Cellulitis of right toe 04/25/2017   Erectile dysfunction due to diseases classified elsewhere 05/31/2016   Awaiting organ transplant status 08/23/2015   Polyneuropathy, unspecified 08/02/2015   Anemia in ESRD (end-stage renal disease) (HCC) 08/02/2015   Gastroenteritis 08/02/2015   Heart failure, unspecified (HCC) 08/02/2015   Secondary hyperparathyroidism of renal origin (HCC) 08/02/2015   Bacterial infection, unspecified 06/29/2015   Unspecified open wound of right upper arm, initial encounter 06/29/2015   Encounter for fitting of gastrointestinal device 06/04/2015   Other specified local infections of the skin and subcutaneous tissue 05/20/2015   Idiopathic chronic hypotension 12/11/2014   Rectal pain 11/27/2014   Other specified diseases of anus and rectum 11/27/2014   Unspecified protein-calorie malnutrition (HCC) 10/08/2014   Encounter for imaging study to confirm nasogastric (NG) tube placement    Encounter for nasogastric (NG) tube placement    Respiratory failure, acute (HCC)    Guillain Barr syndrome, lower ext weakness 08/27/2014   CAP (community acquired pneumonia) 08/23/2014   ESRD on dialysis (HCC) 08/23/2014   A-V fistula (HCC) 08/23/2014   ARDS (adult respiratory distress syndrome) (HCC) 08/23/2014   Septic shock (HCC) 08/23/2014   Hypotension 08/23/2014   Acute respiratory failure (HCC) 08/23/2014   Fever    Type 2 diabetes mellitus (HCC) 07/01/2014   Type 2 diabetes mellitus with peripheral neuropathy (HCC) 07/01/2014   Headache, unspecified 05/23/2014   Pruritus, unspecified 05/23/2014   Shortness of breath 05/23/2014   Iron deficiency anemia, unspecified 03/16/2014   Complication of  vascular dialysis catheter 01/08/2014   Diarrhea, unspecified 01/08/2014   Pain, unspecified 01/08/2014   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, implants and  grafts, sequela 01/08/2014   Anxiety disorder, unspecified 12/28/2013   Photophobia 12/11/2013   Spells 10/30/2013   Dizziness 10/30/2013   Other disorders of electrolyte and fluid balance, not elsewhere classified 09/16/2013   Other ulcerative colitis without complications (HCC) 06/11/2013   Coagulation defect, unspecified (HCC) 04/21/2013   Other allergy, initial encounter 04/21/2013   Other mechanical complication of surgically created arteriovenous fistula, initial encounter (HCC) 02/19/2013   Foot drop 12/09/2012   History of Guillain-Barre syndrome 12/09/2012   Other specified disorders of parathyroid gland (HCC) 05/15/2012   Dependence on renal dialysis (HCC) 11/29/2010   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    Orientation RESPIRATION BLADDER Height & Weight     Self, Time, Situation, Place  Normal Continent Weight: 280 lb (127 kg) Height:  6\' 2"  (188 cm)  BEHAVIORAL SYMPTOMS/MOOD NEUROLOGICAL BOWEL NUTRITION STATUS      Continent    AMBULATORY STATUS COMMUNICATION OF NEEDS Skin   Extensive Assist Verbally Wound Vac (Rt ankle)                       Personal Care Assistance Level of Assistance  Bathing, Feeding, Dressing Bathing Assistance: Limited assistance Feeding assistance: Limited assistance Dressing Assistance: Limited assistance     Functional Limitations Info  Sight, Hearing, Speech Sight Info: Adequate Hearing Info: Adequate Speech Info: Adequate    SPECIAL CARE FACTORS FREQUENCY                       Contractures      Additional Factors Info  Code Status, Allergies Code Status Info: FULL Allergies Info: Hepatitis B Vaccine; Ondansetron Minoxidil  Morphine And Codeine;    Omnipaque (Iohexol)           Current Medications (02/01/2023):  This is the current hospital active medication list Current Facility-Administered Medications  Medication Dose Route Frequency Provider Last  Rate Last Admin   (feeding supplement) PROSource Plus liquid 30 mL  30 mL Oral TID BM Sharyn Creamer, MD   30 mL at 01/31/23 2351   ascorbic acid (VITAMIN C) tablet 500 mg  500 mg Oral BID Sharyn Creamer, MD   500 mg at 01/31/23 2351   aspirin EC tablet 81 mg  81 mg Oral Daily Sharyn Creamer, MD   81 mg at 01/31/23 1456   atorvastatin (LIPITOR) tablet 40 mg  40 mg Oral Daily Sharyn Creamer, MD   40 mg at 01/31/23 1456   [START ON 02/06/2023] cefTAZidime (FORTAZ) 1 g in sodium chloride 0.9 % 100 mL IVPB  1 g Intravenous Q Tue-HD Meegan, Eryn, RPH       cefTAZidime (FORTAZ) 1 g in sodium chloride 0.9 % 100 mL IVPB  1 g Intravenous Q Thu-HD Meegan, Eryn, RPH       [START ON 02/03/2023] cefTAZidime (FORTAZ) 2 g in sodium chloride 0.9 % 100 mL IVPB  2 g Intravenous Q Sat-HD Meegan, Eryn, RPH       clopidogrel (PLAVIX) tablet 75 mg  75 mg Oral Daily Sharyn Creamer, MD   75 mg at 01/31/23 1456   heparin injection 5,000 Units  5,000 Units Subcutaneous Q8H Dahal, Binaya, MD       hydrOXYzine (ATARAX) tablet 25 mg  25 mg Oral BID Sharyn Creamer, MD   25 mg  at 01/31/23 2351   insulin aspart (novoLOG) injection 0-5 Units  0-5 Units Subcutaneous QHS Dahal, Binaya, MD       insulin aspart (novoLOG) injection 0-9 Units  0-9 Units Subcutaneous TID WC Dahal, Binaya, MD       metroNIDAZOLE (FLAGYL) tablet 500 mg  500 mg Oral BID Dahal, Melina Schools, MD   500 mg at 01/31/23 2351   multivitamin (RENA-VIT) tablet 1 tablet  1 tablet Oral QHS Sharyn Creamer, MD   1 tablet at 01/31/23 2355   nutrition supplement (JUVEN) (JUVEN) powder packet 1 packet  1 packet Oral BID BM Sharyn Creamer, MD       oxyCODONE-acetaminophen (PERCOCET/ROXICET) 5-325 MG per tablet 2 tablet  2 tablet Oral Q6H PRN Sharyn Creamer, MD   2 tablet at 01/31/23 2354   pregabalin (LYRICA) capsule 25 mg  25 mg Oral Daily Dahal, Melina Schools, MD   25 mg at 01/31/23 1659   Current Outpatient Medications  Medication Sig Dispense Refill   acetaminophen (TYLENOL) 500 MG tablet Take 1,000-1,500  mg by mouth 2 (two) times daily as needed for moderate pain or headache.     Alpha-Lipoic Acid 600 MG TABS Take 1 tablet by mouth daily.     ascorbic acid (VITAMIN C) 500 MG tablet Take 1 tablet (500 mg total) by mouth 2 (two) times daily.     aspirin EC 81 MG tablet Take 1 tablet (81 mg total) by mouth daily. Swallow whole.     atorvastatin (LIPITOR) 40 MG tablet Take 1 tablet (40 mg total) by mouth daily. 30 tablet 2   clopidogrel (PLAVIX) 75 MG tablet Take 1 tablet (75 mg total) by mouth daily. 30 tablet 2   hydrOXYzine (ATARAX) 25 MG tablet Take 1 tablet by mouth 2 (two) times daily.     metroNIDAZOLE (FLAGYL) 500 MG tablet Take 1 tablet (500 mg total) by mouth 2 (two) times daily for 21 days. 42 tablet 0   multivitamin (RENA-VIT) TABS tablet Take 1 tablet by mouth at bedtime. 30 tablet 2   oxyCODONE-acetaminophen (PERCOCET) 10-325 MG tablet Take 1 tablet by mouth every 4 (four) hours as needed for pain. 135 tablet 0   pregabalin (LYRICA) 25 MG capsule Take by mouth.     sildenafil (VIAGRA) 100 MG tablet Take 100 mg by mouth daily as needed for erectile dysfunction.     zinc sulfate 220 (50 Zn) MG capsule Take 1 capsule (220 mg total) by mouth daily.     AURYXIA 1 GM 210 MG(Fe) tablet Take 420 mg by mouth 3 (three) times daily. (Patient not taking: Reported on 01/21/2023)     calcium acetate (PHOSLO) 667 MG capsule Take 2,001 mg by mouth 3 (three) times daily. (Patient not taking: Reported on 01/31/2023)     cefTAZidime (FORTAZ) IVPB Inject 1 g into the vein every Tuesday AND 1 g every Thursday AND 2 g every Saturday. Do all this for 20 days. Indication:  Osteomyelitis First Dose: Yes Last Day of Therapy:  02/19/23 Labs - Once weekly:  CBC/D and CMP, Labs - Once weekly: ESR and CRP Method of administration: IV Push Method of administration may be changed at the discretion of home infusion pharmacist based upon assessment of the patient and/or caregiver's ability to self-administer the  medication ordered.. 9 Units 0   nutrition supplement, JUVEN, (JUVEN) PACK Take 1 packet by mouth 2 (two) times daily between meals. 30 each 1   Nutritional Supplements (,FEEDING SUPPLEMENT, PROSOURCE PLUS) liquid Take 30 mLs  by mouth 3 (three) times daily between meals. 887 mL 1     Discharge Medications: Please see discharge summary for a list of discharge medications.  Relevant Imaging Results:  Relevant Lab Results:   Additional Information SSN # [id number]  Marquita Palms, LCSW

## 2023-02-01 NOTE — Assessment & Plan Note (Signed)
Gangrenous foot ulcer.  Continue treatment of ceftazidime with dialysis and Flagyl.

## 2023-02-01 NOTE — ED Notes (Signed)
Dr. Modesto Charon was notified of pt refused FSBS and heparin SQ injections. No orders received.

## 2023-02-01 NOTE — ED Provider Notes (Signed)
Emergency Medicine Observation Re-evaluation Note  Physical Exam   BP 119/78 (BP Location: Right Arm)   Pulse 78   Temp 98.3 F (36.8 C) (Oral)   Resp 16   Ht 6\' 2"  (1.88 m)   Wt 127 kg   SpO2 96%   BMI 35.95 kg/m   Patient appears in no acute distress.  ED Course / MDM   Patient refusing blood sugar checks and subcu heparin injections, has capacity make decisions, will reattempt  Pt is awaiting dispo from consultants   Pilar Jarvis MD    Pilar Jarvis, MD 02/01/23 0225

## 2023-02-01 NOTE — Progress Notes (Signed)
OT Cancellation Note  Patient Details Name: Patrick Downs MRN: [id number] DOB: 1975/12/14   Cancelled Treatment:    Reason Eval/Treat Not Completed: Other (comment) (attempted to see pt, pt is OTF per chart, per ED staff at dialysis. OT will re attempt as able.Oleta Mouse, OTD OTR/L  02/01/23, 1:36 PM

## 2023-02-01 NOTE — Assessment & Plan Note (Signed)
Patient unable to take care of himself at home being nonweightbearing of the right lower extremity and having a wound VAC in place.  Obtain PT and OT consultation.  TOC to try to set up placement and insurance authorization.

## 2023-02-01 NOTE — Progress Notes (Signed)
[facility]  ROUNDING NOTE   Subjective:   [name] was admitted to [facility] on 01/31/2023 for Wound Check  Patient seen and evaluated during dialysis   HEMODIALYSIS FLOWSHEET:  Blood Flow Rate (mL/min): 350 mL/min Arterial Pressure (mmHg): -200 mmHg Venous Pressure (mmHg): 180 mmHg TMP (mmHg): 10 mmHg Ultrafiltration Rate (mL/min): 914 mL/min Dialysate Flow Rate (mL/min): 300 ml/min  Resting comfortably on dialysis  Continue ceftazidine 1g with HD treatments until 10/28. Also with PO metronidazole.   Objective:  Vital signs in last 24 hours:  Temp:  [97.6 F (36.4 C)-98.3 F (36.8 C)] 97.6 F (36.4 C) (10/10 0945) Pulse Rate:  [70-78] 70 (10/10 1130) Resp:  [9-16] 10 (10/10 1130) BP: (98-119)/(49-88) 106/49 (10/10 1130) SpO2:  [96 %-100 %] 100 % (10/10 0945) Weight:  [045 kg] 127 kg (10/10 0945)  Weight change:  Filed Weights   01/31/23 1217 02/01/23 0945  Weight: 127 kg 127 kg    Intake/Output: No intake/output data recorded.   Intake/Output this shift:  No intake/output data recorded.  Physical Exam: General: No acute distress  Head: Normocephalic, atraumatic. Moist oral mucosal membranes  Lungs:  Clear to auscultation, normal effort  Heart: S1S2 no rubs  Abdomen:  Soft, nontender, bowel sounds present  Extremities: No peripheral edema.RLE NPWV  Neurologic: Awake, alert, following commands  Skin: No acute rash, right foot surgical dressing  Access: Right IJ PermCath    Basic Metabolic Panel: Recent Labs  Lab 01/26/23 0503 01/27/23 0548 01/28/23 0538 01/30/23 0916 02/01/23 0947  NA 137 137 137 141 137  K 5.1 5.3* 4.6 5.1 4.8  CL 98 100 100 99 96*  CO2 24 18* 17* 21* 22  GLUCOSE 162* 87 92 102* 106*  BUN 71* 94* 72* 138* 99*  CREATININE 10.57* 12.92* 10.26* 15.63* 14.60*  CALCIUM 6.8* 6.2* 6.8* 6.2* 6.5*  PHOS 4.4 7.9* 6.5* 9.5* 9.8*    Liver Function Tests: Recent Labs  Lab 01/26/23 0503 01/27/23 0548  01/28/23 0538 01/30/23 0916 02/01/23 0947  ALBUMIN 3.1* 3.3* 3.3* 3.4* 3.5   No results for input(s): "LIPASE", "AMYLASE" in the last 168 hours. No results for input(s): "AMMONIA" in the last 168 hours.  CBC: Recent Labs  Lab 01/30/23 0916 02/01/23 0947  WBC 6.3 5.6  HGB 10.3* 10.2*  HCT 32.3* 31.7*  MCV 90.2 91.1  PLT 179 155    Cardiac Enzymes: No results for input(s): "CKTOTAL", "CKMB", "CKMBINDEX", "TROPONINI" in the last 168 hours.  BNP: Invalid input(s): "POCBNP"  CBG: No results for input(s): "GLUCAP" in the last 168 hours.   Microbiology: Results for orders placed or performed during the hospital encounter of 01/21/23  Blood Culture (routine x 2)     Status: None   Collection Time: 01/21/23  5:08 AM   Specimen: BLOOD  Result Value Ref Range Status   Specimen Description BLOOD BLOOD RIGHT ARM  Final   Special Requests   Final    BOTTLES DRAWN AEROBIC AND ANAEROBIC Blood Culture results may not be optimal due to an inadequate volume of blood received in culture bottles   Culture   Final    NO GROWTH 5 DAYS Performed at [facility], [address], [place], [place] [address]    Report Status 01/26/2023 FINAL  Final  Blood Culture (routine x 2)     Status: None   Collection Time: 01/21/23  5:31 AM   Specimen: BLOOD  Result Value Ref Range Status   Specimen Description BLOOD  BLOOD LEFT ARM  Final   Special Requests   Final    BOTTLES DRAWN AEROBIC AND ANAEROBIC Blood Culture results may not be optimal due to an inadequate volume of blood received in culture bottles   Culture   Final    NO GROWTH 5 DAYS Performed at [facility], [address], [place], [place] [address]    Report Status 01/26/2023 FINAL  Final  Aerobic/Anaerobic Culture w Gram Stain (surgical/deep wound)     Status: None (Preliminary result)   Collection Time: 01/23/23  1:13 PM   Specimen: Wound; Body Fluid  Result Value Ref Range Status   Specimen Description    Final    WOUND Performed at [facility], [address] [place], [place] [address]    Special Requests   Final    RIGHT 5TH METATARSEL ULCER Performed at [facility], [address] [place], [place] [address]    Gram Stain NO WBC SEEN NO ORGANISMS SEEN   Final   Culture   Final    RARE ERYSIPELOTHRIX RHUSIOPATHIAE Sent to [facility] for further susceptibility testing. NO ANAEROBES ISOLATED Performed at [facility], [address], [place], [place] [address]    Report Status PENDING  Incomplete  Aerobic/Anaerobic Culture w Gram Stain (surgical/deep wound)     Status: None   Collection Time: 01/23/23  1:16 PM   Specimen: Wound  Result Value Ref Range Status   Specimen Description   Final    WOUND Performed at [facility], [address] [place], [place] [address]    Special Requests   Final    RIGHT 5TH MATATARSEL BONE Performed at [facility], [address] [place], [place] [address]    Gram Stain NO WBC SEEN NO ORGANISMS SEEN   Final   Culture   Final    No growth aerobically or anaerobically. Performed at [facility], [address], [place], [place] [address]    Report Status 01/29/2023 FINAL  Final    Coagulation Studies: No results for input(s): "LABPROT", "INR" in the last 72 hours.   Urinalysis: No results for input(s): "COLORURINE", "LABSPEC", "PHURINE", "GLUCOSEU", "HGBUR", "BILIRUBINUR", "KETONESUR", "PROTEINUR", "UROBILINOGEN", "NITRITE", "LEUKOCYTESUR" in the last 72 hours.  Invalid input(s): "APPERANCEUR"    Imaging: No results found.   Medications:    [START ON 02/06/2023] cefTAZidime (FORTAZ)  IV     cefTAZidime (FORTAZ)  IV     [START ON 02/03/2023] cefTAZidime (FORTAZ)  IV      (feeding supplement) PROSource Plus  30 mL Oral TID BM   ascorbic acid  500 mg Oral BID   aspirin EC  81 mg Oral Daily   atorvastatin  40 mg Oral Daily   Chlorhexidine Gluconate Cloth  6 each Topical  Q0600   clopidogrel  75 mg Oral Daily   heparin injection (subcutaneous)  5,000 Units Subcutaneous Q8H   hydrOXYzine  25 mg Oral BID   insulin aspart  0-5 Units Subcutaneous QHS   insulin aspart  0-9 Units Subcutaneous TID WC   metroNIDAZOLE  500 mg Oral BID   multivitamin  1 tablet Oral QHS   nutrition supplement (JUVEN)  1 packet Oral BID BM   pregabalin  25 mg Oral Daily   heparin, lidocaine-prilocaine, oxyCODONE-acetaminophen, pentafluoroprop-tetrafluoroeth  Assessment/ Plan:  47 y.o. male ESRD on HD TTS, CHF, history of Guillain-Barr syndrome, hypertension, anemia of chronic kidney disease, secondary hyperparathyroidism, history of right foot toe amputation who presents  with ulceration on lateral aspect of right foot.  [facility]  1.  ESRD on HD TTS.  Receiving dialysis today, UF goal 2L as tolerated. Next treatment scheduled for Saturday. Will monitor discharge plan, which includes rehab placement.   2.  Anemia of chronic kidney disease.   Lab Results  Component Value Date   HGB 10.2 (L) 02/01/2023   Patient receives Mircera as outpatient. Hgb within optimal range. No need for ESAs  3.  Secondary hyperparathyroidism with hypocalcemia  - Hyperphosphatemia noted - Continue auryxia with meals.   4.  Diabetes mellitus type II with chronic kidney disease and complication of right foot osteomyelitis: appreciate podiatry, vascular and ID input.  - continue ceftazidime 1g with HD treatments - metronidazole PO    LOS: 0   10/10/202411:49 AM

## 2023-02-01 NOTE — ED Notes (Signed)
Called dietary and ordered a tray for the patient.

## 2023-02-01 NOTE — Consult Note (Signed)
WOC Nurse Consult Note: Reason for Consult: Right lateral foot NPWT (VAC) dressing in place.  Patient was discharged 01/29/23 and has returned due to inability to care for self at home and requesting placement for rehab.  Wound type:Neuropathic/infectious Pressure Injury POA: NA Measurement:not assessed today Wound bed:not assessed today Drainage (amount, consistency, odor) serosanguinous effluent in canister Periwound:covered with VAC drape.  Dressing procedure/placement/frequency:Next dressing change will be 02/02/23.  Request sent to bedside staff to order facility [facility] and canister (LAWSON # [phone]) and medium black foam (LAWSON # [id number]) with barrier ring (LAWSON # [id number]).  THis is requested via secure chat.  Patient has been in dialysis much of today.  Will follow.  Mike Gip MSN, RN, FNP-BC CWON Wound, Ostomy, Continence Nurse Outpatient [facility] [phone] Pager [phone]

## 2023-02-01 NOTE — Assessment & Plan Note (Signed)
Hemoglobin 9.2.  Check again tomorrow.

## 2023-02-01 NOTE — ED Notes (Signed)
Pt up to the restroom with a wheelchair. Recliner given to significant other.

## 2023-02-01 NOTE — ED Notes (Signed)
Pt refused glucose checks any diabetic medical care

## 2023-02-01 NOTE — Hospital Course (Addendum)
47 year old man past medical history of end-stage renal disease on hemodialysis Tuesday Thursday Saturday, chronic hypotension, hyperlipidemia, congestive heart failure, peripheral neuropathy, depression, diabetic foot ulcers.  Patient was recently in the hospital for right lower extremity cellulitis found to have osteomyelitis of the fifth metatarsal and a gangrenous ulcer.  Patient had bone biopsy and wound VAC was applied.  Underwent revascularization on 10/2 by Dr. Wyn Quaker.  Patient on aspirin and Plavix.  Patient was discharged home on oral Flagyl and IV ceftazidime 1 g IV with hemodialysis treatments until 10/28.  Patient lives home alone and he is nonweightbearing of his right lower extremity and had a wound VAC.  He came back to the ER for placement.

## 2023-02-01 NOTE — Assessment & Plan Note (Signed)
Hemoglobin A1c 6.0.  Patient on Lyrica

## 2023-02-01 NOTE — Evaluation (Signed)
Occupational Therapy Evaluation Patient Details Name: Patrick Downs MRN: [id number] DOB: 07-21-1975 Today's Date: 02/01/2023   History of Present Illness Pt is a 47 year old male presenting to the ED with difficulties caring for himself at home after discharge on 01/30/23    PMH signficiant for ESRD on HD, CHF, HTN, GBS, peripheral neuropathy, diabetes, recent osteomyelitis aortogram with selective right lower extremity angiogram with angioplasty and stent placement to the right distal SFA above-the-knee L popliteal artery stent.  Patient is also postoperative from excisional debridement of an ulcer to the bone of the right fifth metatarsal with placement of wound VAC and vancomycin antibiotic beads.   Clinical Impression   Chart reviewed, pt greeted in ED hallway, alert and oriented x4 ,agreeable to OT evaluation. Pt reports since discharge, he has difficulties with use of wound vac, new NWBing status with RW around carpeted surface in house in addition to his 5 STE. Prior to previous admission pt was MOD I-I in ADL/IADL, driving, now requires assist for ADL/IADL that he reports he feels he is having trouble managing. Pt presents with deficits in activity tolerance, balance, strength affecting safe and optimal Adl completion. Bed mobility completed with supervision, STS with MIN A, amb 4 feet forward and back with RW with MIN-MOD A, good adherence to NWBing precautions. Pt will benefit from post acute OT to address deficits and to facilitate optimal ADL performance. OT will follow acutely.       If plan is discharge home, recommend the following: Two people to help with walking and/or transfers;A little help with walking and/or transfers;Assist for transportation;Help with stairs or ramp for entrance    Functional Status Assessment  Patient has had a recent decline in their functional status and demonstrates the ability to make significant improvements in function in a reasonable and predictable  amount of time.  Equipment Recommendations  Other (comment) (per next venue of care)    Recommendations for Other Services       Precautions / Restrictions Precautions Precautions: Fall Precaution Comments: B foot drop (AFO's); Restrictions Weight Bearing Restrictions: Yes RLE Weight Bearing: Non weight bearing      Mobility Bed Mobility Overal bed mobility: Needs Assistance Bed Mobility: Supine to Sit     Supine to sit: Supervision          Transfers Overall transfer level: Needs assistance Equipment used: Rolling walker (2 wheels) Transfers: Sit to/from Stand Sit to Stand: Min assist                  Balance Overall balance assessment: Needs assistance Sitting-balance support: Single extremity supported Sitting balance-Leahy Scale: Good     Standing balance support: Bilateral upper extremity supported, During functional activity Standing balance-Leahy Scale: Fair                             ADL either performed or assessed with clinical judgement   ADL Overall ADL's : Needs assistance/impaired Eating/Feeding: Set up;Sitting                   Lower Body Dressing: Moderate assistance Lower Body Dressing Details (indicate cue type and reason): socks Toilet Transfer: Minimal assistance;Ambulation;Rolling walker (2 wheels) Toilet Transfer Details (indicate cue type and reason): simulated Toileting- Clothing Manipulation and Hygiene: Moderate assistance       Functional mobility during ADLs: Minimal assistance;Moderate assistance (approx 4' forward and back 2 attempts) General ADL Comments: pt does not  have AFO with him, reports he will walk at home without AFO at times     Vision Patient Visual Report: No change from baseline       Perception         Praxis         Pertinent Vitals/Pain Pain Assessment Pain Assessment: No/denies pain     Extremity/Trunk Assessment Upper Extremity Assessment Upper Extremity  Assessment: Overall WFL for tasks assessed (wasting noted throughout B hands, pt notes this is baseline from GBS)   Lower Extremity Assessment Lower Extremity Assessment: Generalized weakness;RLE deficits/detail RLE Deficits / Details: NWBing RLE with wound vac, R foot wrapped   Cervical / Trunk Assessment Cervical / Trunk Assessment: Normal   Communication Communication Communication: No apparent difficulties Cueing Techniques: Verbal cues;Visual cues   Cognition Arousal: Alert Behavior During Therapy: WFL for tasks assessed/performed Overall Cognitive Status: Within Functional Limits for tasks assessed                                       General Comments  R foot wrapped, wound vac appears functioning pre/potst session    Exercises Other Exercises Other Exercises: edu re: role of OT, role of rehab, discharge recommendations, home safety, falls prevention   Shoulder Instructions      Home Living Family/patient expects to be discharged to:: Private residence Living Arrangements: Alone Available Help at Discharge: Friend(s);Available PRN/intermittently Type of Home: House Home Access: Stairs to enter [employer] of Steps: 5 Entrance Stairs-Rails: Left Home Layout: One level     Bathroom Shower/Tub: Chief Strategy Officer: Standard     Home Equipment: Grab bars - tub/shower;Other (comment);Rolling Walker (2 wheels);BSC/3in1   Additional Comments: Has used a tub transfer bench before and knows how to use it, but does not currently have one.      Prior Functioning/Environment               Mobility Comments: amb with AFOs and no AD, no recent falls ADLs Comments: prior to previous admission was MOD I in ADL/IADL, recent difficulties with new NWBing status and RW use at home, no reported falls        OT Problem List: Decreased knowledge of use of DME or AE;Decreased activity tolerance;Decreased safety awareness;Decreased  strength;Impaired balance (sitting and/or standing)      OT Treatment/Interventions: Self-care/ADL training;Therapeutic exercise;DME and/or AE instruction;Therapeutic activities;Patient/family education    OT Goals(Current goals can be found in the care plan section) Acute Rehab OT Goals Patient Stated Goal: rehab OT Goal Formulation: With patient Time For Goal Achievement: 02/15/23 Potential to Achieve Goals: Good ADL Goals Pt Will Perform Grooming: with modified independence;standing;sitting Pt Will Perform Lower Body Dressing: with modified independence;sit to/from stand Pt Will Transfer to Toilet: with modified independence;ambulating Pt Will Perform Toileting - Clothing Manipulation and hygiene: with modified independence;sit to/from stand  OT Frequency: Min 1X/week    Co-evaluation              AM-PAC OT "6 Clicks" Daily Activity     Outcome Measure Help from another person eating meals?: None Help from another person taking care of personal grooming?: None Help from another person toileting, which includes using toliet, bedpan, or urinal?: A Little Help from another person bathing (including washing, rinsing, drying)?: A Little Help from another person to put on and taking off regular upper body clothing?: None Help from another  person to put on and taking off regular lower body clothing?: A Little 6 Click Score: 21   End of Session Equipment Utilized During Treatment: Rolling walker (2 wheels) Nurse Communication: Mobility status  Activity Tolerance: Patient tolerated treatment well Patient left: in bed;Other (comment) (in care of PT)  OT Visit Diagnosis: Other abnormalities of gait and mobility (R26.89)                Time: 1610-9604 OT Time Calculation (min): 14 min Charges:  OT General Charges $OT Visit: 1 Visit OT Evaluation $OT Eval Low Complexity: 1 Low Oleta Mouse, OTD OTR/L  02/01/23, 4:17 PM

## 2023-02-01 NOTE — TOC Progression Note (Signed)
Transition of Care [facility]) - Progression Note    Patient Details  Name: Patrick Downs MRN: [id number] Date of Birth: [birth year]  [facility]) CM/SW Contact  Marquita Palms, LCSW Phone Number: 02/01/2023, 4:46 PM  Clinical Narrative:     CSW accepted placement for the patient with [facility] for [facility]. CSW cna not complete the authorization due to the PT note not being in the system. PT OT eval and treat has been in the system since earlier in the day but patient was in dialysis. OT was able to put the note in before 4:30pm but still no PT note. Requested note from PT with no response. CSW message Hospitalist for update on status for patient and he reported he would keep same status until tomorrow.       Expected Discharge Plan and Services                                               Social Determinants of Health (SDOH) Interventions SDOH Screenings   Food Insecurity: No Food Insecurity (09/11/2022)   Received from [facility], [facility]  Transportation Needs: No Transportation Needs (09/11/2022)   Received from [facility], [facility]  Depression [phone]): Low Risk  (06/27/2021)  Financial Resource Strain: Low Risk  (09/11/2022)   Received from [facility], [facility]  Social Connections: Unknown (09/11/2022)   Received from [facility], [facility]  Stress: No Stress Concern Present (09/11/2022)   Received from [facility], [facility]  Tobacco Use: Low Risk  (01/31/2023)    Readmission Risk Interventions    01/22/2023   10:18 AM  Readmission Risk Prevention Plan  Transportation Screening Complete  PCP or Specialist Appt within 3-5 Days Complete  HRI or Home Care Consult Complete  Social Work Consult for Recovery Care Planning/Counseling Patient refused  Palliative  Care Screening Not Applicable  Medication Review Oceanographer) Complete

## 2023-02-02 DIAGNOSIS — M6289 Other specified disorders of muscle: Secondary | ICD-10-CM | POA: Diagnosis not present

## 2023-02-02 NOTE — ED Notes (Signed)
RN offered supplies and assistance for pt to complete hygiene care. Pt declined, but requested a snack. Snack provided. Pt repositioned and extra pillow support provided for comfort. Family at bedside.

## 2023-02-02 NOTE — ED Notes (Signed)
Pt just woke up and stated his pain was back. Pt medicated. No other needs at this time.

## 2023-02-02 NOTE — ED Provider Notes (Signed)
I re-evaluated patient resting comfortable in bed.  Alerted by social work that patient was going to a rehab facility today.  He does have a wound VAC in place and he denies any concerns and felt comfortable with this plan   Concha Se, MD 02/02/23 [phone]

## 2023-02-02 NOTE — TOC Transition Note (Signed)
Transition of Care [facility]) - CM/SW Discharge Note   Patient Details  Name: Patrick Downs MRN: [id number] Date of Birth: 14-Nov-1975  Transition of Care [facility]) CM/SW Contact:  Marquita Palms, LCSW Phone Number: 02/02/2023, 8:17 AM   Clinical Narrative:     Patient discharging to [facility] today, for physical therapy. Authorization approved [id number]. Awaiting discharge summary to the facility. Nothing follows.         Patient Goals and CMS Choice      Discharge Placement                         Discharge Plan and Services Additional resources added to the After Visit Summary for                                       Social Determinants of Health (SDOH) Interventions SDOH Screenings   Food Insecurity: No Food Insecurity (09/11/2022)   Received from [facility], [facility]  Transportation Needs: No Transportation Needs (09/11/2022)   Received from [facility], [facility]  Depression [phone]): Low Risk  (06/27/2021)  Financial Resource Strain: Low Risk  (09/11/2022)   Received from [facility], [facility]  Social Connections: Unknown (09/11/2022)   Received from [facility], [facility]  Stress: No Stress Concern Present (09/11/2022)   Received from [facility], [facility]  Tobacco Use: Low Risk  (01/31/2023)     Readmission Risk Interventions    01/22/2023   10:18 AM  Readmission Risk Prevention Plan  Transportation Screening Complete  PCP or Specialist Appt within 3-5 Days Complete  HRI or Home Care Consult Complete  Social Work Consult for Recovery Care Planning/Counseling Patient refused  Palliative Care Screening Not Applicable  Medication Review Oceanographer) Complete

## 2023-02-02 NOTE — ED Notes (Signed)
Pt refused CBG.

## 2023-02-02 NOTE — Consult Note (Signed)
Pharmacy Antibiotic Note  ASSESSMENT: 47 y.o. male with PMH of ESRD on HD is presenting with cellulitis. Pharmacy has been consulted to manage ceftazidime and metronidazole dosing. Patient was recently hospitalized and discharged with OPAT regimen of ceftazidime 1 gram on Tuesdays and Thursdays and ceftazidime 2 gm on Saturdays along with metronidazole 500 mg PO BID to end 02/19/2023.   *Patient remains in ED waiting for placement. Abx given as ordered. Will follow and adjust therapy as/if needed*  Patient measurements: Height: 6\' 2"  (188 cm) Weight: 125 kg (275 lb 9.2 oz) IBW/kg (Calculated) : 82.2  Vital signs: Temp: 98.3 F (36.8 C) (10/10 2330) Temp Source: Oral (10/10 2330) BP: 113/69 (10/10 2330) Pulse Rate: 77 (10/10 2330)  Recent Labs  Lab 01/28/23 0538 01/30/23 0916 02/01/23 0947  WBC  --  6.3 5.6  CREATININE 10.26* 15.63* 14.60*   Estimated Creatinine Clearance: 8.9 mL/min (A) (by C-G formula based on SCr of 14.6 mg/dL (H)).  Allergies: Allergies  Allergen Reactions   Hepatitis B Vaccine     Other reaction(s): Unknown   Ondansetron Other (See Comments)    Stomach pain    Minoxidil Other (See Comments)    "put fluid around my heart", PERICARDIAL EFFUSION   Morphine And Codeine Other (See Comments)    Aggressive    Omnipaque [Iohexol] Itching and Other (See Comments)    Rigors on one occasion, widespread itching on a separate occasion (resolved with Benadryl), tremors   Antimicrobials this admission: Ceftazidime 10/9 >>  Metronidazole 10/9 >>  Dose adjustments this admission: N/A  PLAN: Continue ceftazidime 1 g IV on Tuesdays and Thursdays and 2 g IV on Saturdays with dialysis through 02/19/2023 Continue metronidazole 500 mg PO twice daily through 02/19/2023 Follow up culture results to assess for antibiotic optimization. Monitor renal function to assess for any necessary antibiotic dosing changes  Thank you for allowing pharmacy to be a part of this  patient's care.  Bobbie Virden Rodriguez-Guzman PharmD, BCPS 02/02/2023 10:48 AM

## 2023-02-02 NOTE — ED Notes (Signed)
Pt given cranberry juice and ice per pt's request.

## 2023-02-02 NOTE — Consult Note (Signed)
WOC Nurse Consult Note:patient familiar to [facility] team from previous admission, please see note from Monday 01/29/2023  Reason for Consult: change NPWT dressing  Wound type: full thickness postop debridement and bone biopsy Dr. Ether Griffins  Pressure Injury POA: NA  Measurement: 3 cm x 4 cm x 0.8 cm  Wound bed: 90% pink moist 10% tan fibrin noted at center of wound, ? Small fragment of bone as well  Drainage (amount, consistency, odor) minimal serosanguinous  Periwound: peeling skin  Dressing procedure/placement/frequency:  Removed old NPWT dressing Cleansed wound with normal saline Periwound skin protected with skin barrier wipe; skin protected with Vac drape to anterior foot where trac pad is bridged to  Filled wound with  1 piece of Adapatic (over ? Bone fragment), 1 piece of black foam Sealed NPWT dressing at HG Patient received PO pain medication per bedside nurse prior to dressing change Patient tolerated procedure well  WOC nurse will continue to provide NPWT dressing changes Mon/Thurs if admitted due to the complexity of the dressing change; patient plans now to be transferred to SNF for rehab later today.  Patient hooked up to home vac machine in light of this.   Thank you,    Priscella Mann MSN, RN-BC, [employer] [phone]

## 2023-02-02 NOTE — ED Notes (Signed)
[facility]  CALLED FOR   TRANSPORT  TO  [facility] ?

## 2023-02-02 NOTE — ED Notes (Signed)
Pt given supplies to complete a bed bath by EDT [place].

## 2023-02-02 NOTE — Discharge Instructions (Signed)
D/c to rehab

## 2023-02-13 ENCOUNTER — Inpatient Hospital Stay
Admission: EM | Admit: 2023-02-13 | Discharge: 2023-02-22 | DRG: 871 | Disposition: A | Discharge status: Home Health | Payer: 59 | Attending: Internal Medicine | Admitting: Internal Medicine

## 2023-02-13 DIAGNOSIS — R579 Shock, unspecified: Secondary | ICD-10-CM | POA: Diagnosis present

## 2023-02-13 DIAGNOSIS — R571 Hypovolemic shock: Principal | ICD-10-CM | POA: Diagnosis present

## 2023-02-13 DIAGNOSIS — N2581 Secondary hyperparathyroidism of renal origin: Secondary | ICD-10-CM | POA: Diagnosis present

## 2023-02-13 DIAGNOSIS — Z833 Family history of diabetes mellitus: Secondary | ICD-10-CM

## 2023-02-13 DIAGNOSIS — D696 Thrombocytopenia, unspecified: Secondary | ICD-10-CM | POA: Diagnosis present

## 2023-02-13 DIAGNOSIS — E1122 Type 2 diabetes mellitus with diabetic chronic kidney disease: Secondary | ICD-10-CM | POA: Diagnosis present

## 2023-02-13 DIAGNOSIS — E1169 Type 2 diabetes mellitus with other specified complication: Secondary | ICD-10-CM | POA: Diagnosis present

## 2023-02-13 DIAGNOSIS — Z841 Family history of disorders of kidney and ureter: Secondary | ICD-10-CM

## 2023-02-13 DIAGNOSIS — L089 Local infection of the skin and subcutaneous tissue, unspecified: Secondary | ICD-10-CM

## 2023-02-13 DIAGNOSIS — N529 Male erectile dysfunction, unspecified: Secondary | ICD-10-CM | POA: Diagnosis present

## 2023-02-13 DIAGNOSIS — Z89422 Acquired absence of other left toe(s): Secondary | ICD-10-CM

## 2023-02-13 DIAGNOSIS — E892 Postprocedural hypoparathyroidism: Secondary | ICD-10-CM | POA: Diagnosis present

## 2023-02-13 DIAGNOSIS — I5032 Chronic diastolic (congestive) heart failure: Secondary | ICD-10-CM | POA: Diagnosis present

## 2023-02-13 DIAGNOSIS — M86671 Other chronic osteomyelitis, right ankle and foot: Secondary | ICD-10-CM | POA: Diagnosis present

## 2023-02-13 DIAGNOSIS — Z7982 Long term (current) use of aspirin: Secondary | ICD-10-CM

## 2023-02-13 DIAGNOSIS — Z7902 Long term (current) use of antithrombotics/antiplatelets: Secondary | ICD-10-CM

## 2023-02-13 DIAGNOSIS — Z992 Dependence on renal dialysis: Secondary | ICD-10-CM

## 2023-02-13 DIAGNOSIS — M869 Osteomyelitis, unspecified: Secondary | ICD-10-CM | POA: Diagnosis present

## 2023-02-13 DIAGNOSIS — I739 Peripheral vascular disease, unspecified: Secondary | ICD-10-CM | POA: Diagnosis present

## 2023-02-13 DIAGNOSIS — E11621 Type 2 diabetes mellitus with foot ulcer: Secondary | ICD-10-CM | POA: Diagnosis present

## 2023-02-13 DIAGNOSIS — D631 Anemia in chronic kidney disease: Secondary | ICD-10-CM | POA: Diagnosis present

## 2023-02-13 DIAGNOSIS — Z89421 Acquired absence of other right toe(s): Secondary | ICD-10-CM

## 2023-02-13 DIAGNOSIS — Z888 Allergy status to other drugs, medicaments and biological substances status: Secondary | ICD-10-CM

## 2023-02-13 DIAGNOSIS — E785 Hyperlipidemia, unspecified: Secondary | ICD-10-CM | POA: Diagnosis present

## 2023-02-13 DIAGNOSIS — Z79899 Other long term (current) drug therapy: Secondary | ICD-10-CM

## 2023-02-13 DIAGNOSIS — Z7409 Other reduced mobility: Secondary | ICD-10-CM | POA: Diagnosis present

## 2023-02-13 DIAGNOSIS — G629 Polyneuropathy, unspecified: Secondary | ICD-10-CM

## 2023-02-13 DIAGNOSIS — E1151 Type 2 diabetes mellitus with diabetic peripheral angiopathy without gangrene: Secondary | ICD-10-CM | POA: Diagnosis present

## 2023-02-13 DIAGNOSIS — Z91041 Radiographic dye allergy status: Secondary | ICD-10-CM

## 2023-02-13 DIAGNOSIS — E861 Hypovolemia: Secondary | ICD-10-CM | POA: Diagnosis present

## 2023-02-13 DIAGNOSIS — I132 Hypertensive heart and chronic kidney disease with heart failure and with stage 5 chronic kidney disease, or end stage renal disease: Secondary | ICD-10-CM | POA: Diagnosis present

## 2023-02-13 DIAGNOSIS — E2740 Unspecified adrenocortical insufficiency: Secondary | ICD-10-CM | POA: Diagnosis present

## 2023-02-13 DIAGNOSIS — L97519 Non-pressure chronic ulcer of other part of right foot with unspecified severity: Secondary | ICD-10-CM | POA: Diagnosis present

## 2023-02-13 DIAGNOSIS — N186 End stage renal disease: Secondary | ICD-10-CM | POA: Diagnosis present

## 2023-02-13 DIAGNOSIS — E663 Overweight: Secondary | ICD-10-CM | POA: Diagnosis present

## 2023-02-13 DIAGNOSIS — G894 Chronic pain syndrome: Secondary | ICD-10-CM | POA: Diagnosis present

## 2023-02-13 DIAGNOSIS — Z6834 Body mass index (BMI) 34.0-34.9, adult: Secondary | ICD-10-CM

## 2023-02-13 DIAGNOSIS — I959 Hypotension, unspecified: Principal | ICD-10-CM | POA: Diagnosis present

## 2023-02-13 DIAGNOSIS — Z885 Allergy status to narcotic agent status: Secondary | ICD-10-CM

## 2023-02-13 DIAGNOSIS — Z887 Allergy status to serum and vaccine status: Secondary | ICD-10-CM

## 2023-02-13 LAB — CBC WITH DIFFERENTIAL/PLATELET
Abs Immature Granulocytes: 0.02 10*3/uL (ref 0.00–0.07)
Basophils Absolute: 0.1 10*3/uL (ref 0.0–0.1)
Basophils Relative: 1 %
Eosinophils Absolute: 0.5 10*3/uL (ref 0.0–0.5)
Eosinophils Relative: 5 %
HCT: 38.7 % — ABNORMAL LOW (ref 39.0–52.0)
Hemoglobin: 12.2 g/dL — ABNORMAL LOW (ref 13.0–17.0)
Immature Granulocytes: 0 %
Lymphocytes Relative: 13 %
Lymphs Abs: 1.1 10*3/uL (ref 0.7–4.0)
MCH: 29 pg (ref 26.0–34.0)
MCHC: 31.5 g/dL (ref 30.0–36.0)
MCV: 91.9 fL (ref 80.0–100.0)
Monocytes Absolute: 0.7 10*3/uL (ref 0.1–1.0)
Monocytes Relative: 8 %
Neutro Abs: 6.7 10*3/uL (ref 1.7–7.7)
Neutrophils Relative %: 73 %
Platelets: 118 10*3/uL — ABNORMAL LOW (ref 150–400)
RBC: 4.21 MIL/uL — ABNORMAL LOW (ref 4.22–5.81)
RDW: 14.4 % (ref 11.5–15.5)
WBC: 9.1 10*3/uL (ref 4.0–10.5)
nRBC: 0 % (ref 0.0–0.2)

## 2023-02-13 LAB — COMPREHENSIVE METABOLIC PANEL
ALT: 30 U/L (ref 0–44)
AST: 27 U/L (ref 15–41)
Albumin: 3.9 g/dL (ref 3.5–5.0)
Alkaline Phosphatase: 82 U/L (ref 38–126)
Anion gap: 16 — ABNORMAL HIGH (ref 5–15)
BUN: 32 mg/dL — ABNORMAL HIGH (ref 6–20)
CO2: 24 mmol/L (ref 22–32)
Calcium: 9.1 mg/dL (ref 8.9–10.3)
Chloride: 94 mmol/L — ABNORMAL LOW (ref 98–111)
Creatinine, Ser: 7.99 mg/dL — ABNORMAL HIGH (ref 0.61–1.24)
GFR, Estimated: 8 mL/min — ABNORMAL LOW (ref >60)
Glucose, Bld: 121 mg/dL — ABNORMAL HIGH (ref 70–99)
Potassium: 4.5 mmol/L (ref 3.5–5.1)
Sodium: 134 mmol/L — ABNORMAL LOW (ref 135–145)
Total Bilirubin: 0.7 mg/dL (ref 0.3–1.2)
Total Protein: 8.7 g/dL — ABNORMAL HIGH (ref 6.5–8.1)

## 2023-02-13 LAB — AEROBIC/ANAEROBIC CULTURE W GRAM STAIN (SURGICAL/DEEP WOUND): Gram Stain: NONE SEEN

## 2023-02-13 LAB — TROPONIN I (HIGH SENSITIVITY)
Troponin I (High Sensitivity): 10 ng/L (ref <18)
Troponin I (High Sensitivity): 12 ng/L (ref <18)

## 2023-02-13 LAB — LACTIC ACID, PLASMA: Lactic Acid, Venous: 1.9 mmol/L (ref 0.5–1.9)

## 2023-02-13 MED ORDER — NOREPINEPHRINE 4 MG/250ML-% IV SOLN
0.0000 ug/min | INTRAVENOUS | Status: DC
  Administered 2023-02-13: 2 ug/min via INTRAVENOUS
  Administered 2023-02-13: 10 ug/min via INTRAVENOUS
  Administered 2023-02-14: 9 ug/min via INTRAVENOUS
  Administered 2023-02-14: 11 ug/min via INTRAVENOUS
  Administered 2023-02-14: 3 ug/min via INTRAVENOUS
  Administered 2023-02-15: 2 ug/min via INTRAVENOUS
  Filled 2023-02-13 (×5): qty 250

## 2023-02-13 MED ORDER — NALOXONE HCL 2 MG/2ML IJ SOSY
0.4000 mg | PREFILLED_SYRINGE | Freq: Once | INTRAMUSCULAR | Status: AC
  Administered 2023-02-13: 0.4 mg via INTRAVENOUS
  Filled 2023-02-13: qty 2

## 2023-02-13 MED ORDER — SODIUM CHLORIDE 0.9 % IV BOLUS
500.0000 mL | Freq: Once | INTRAVENOUS | Status: AC
  Administered 2023-02-13: 500 mL via INTRAVENOUS

## 2023-02-13 MED ORDER — SODIUM CHLORIDE 0.9 % IV BOLUS
1000.0000 mL | Freq: Once | INTRAVENOUS | Status: AC
  Administered 2023-02-13: 1000 mL via INTRAVENOUS

## 2023-02-13 MED ORDER — NALOXONE HCL 4 MG/0.1ML NA LIQD
1.0000 | Freq: Once | NASAL | Status: AC
  Administered 2023-02-13: 1 via NASAL

## 2023-02-13 MED ORDER — NOREPINEPHRINE 4 MG/250ML-% IV SOLN
INTRAVENOUS | Status: AC
  Filled 2023-02-13: qty 250

## 2023-02-13 NOTE — ED Notes (Signed)
Family at bedside. 

## 2023-02-13 NOTE — ED Triage Notes (Signed)
Pt to Ed via [facility] from [facility]. Ems reports pt receieved dialysis today and was unable to finish last due to low BP. Pt was sent back to [facility]. EMS states facility was not made away of low BP at dialysis and gave pt percocet for chronic pain. EMS called due to BP in the 50s systolic. Pt reports dizziness. Pt access in right chest. 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 BP taken on leg with EMS.   BP on arrival 56/37. Verbal order for intranasal narcan.

## 2023-02-13 NOTE — ED Notes (Signed)
Pt had large BM on bedpan. Pt cleaned. Linens changed and brief placed on pt.

## 2023-02-13 NOTE — ED Notes (Signed)
Lee NP moved BP cuff from right arm to left leg

## 2023-02-13 NOTE — ED Notes (Signed)
Patrick Hai, NP at bedside with pt doing assessment and discussing POC with fmaily

## 2023-02-13 NOTE — ED Provider Notes (Signed)
[facility] Provider Note   Event Date/Time   First MD Initiated Contact with Patient 02/13/23 [phone]     (approximate) History  Hypotension (Dizziness/)  HPI Patrick Downs is a 47 y.o. male with a stated past medical history of end-stage renal disease on dialysis Tuesday/Thursday/Saturday who presents after dialysis with complaints of hypotension per [facility].  Patient arrives via EMS after they found his blood pressure to be in the 50s systolic.  Patient does reports lightheadedness.  Patient states that he received a Percocet that is scheduled for him for his chronic pain however his facility did not know about his low blood pressure after dialysis. ROS: Patient currently denies any vision changes, tinnitus, difficulty speaking, facial droop, sore throat, chest pain, shortness of breath, abdominal pain, nausea/vomiting/diarrhea, dysuria, or weakness/numbness/paresthesias in any extremity   Physical Exam  Triage Vital Signs: ED Triage Vitals  Encounter Vitals Group     BP      Systolic BP Percentile      Diastolic BP Percentile      Pulse      Resp      Temp      Temp src      SpO2      Weight      Height      Head Circumference      [facility] Flow      Pain Score      Pain Loc      Pain Education      Exclude from Growth Chart    Most recent vital signs: Vitals:   02/14/23 0000 02/14/23 0004  BP: (!) 80/45 90/61  Pulse: 86 82  Resp:  12  Temp:    SpO2: 97% 97%   General: Awake, oriented x4. CV:  Good peripheral perfusion.  Resp:  Normal effort.  Abd:  No distention.  Other:  Middle-aged overweight African-American male resting comfortably in no acute distress.  Tunneled catheter to right chest wall ED Results / Procedures / Treatments  Labs (all labs ordered are listed, but only abnormal results are displayed) Labs Reviewed  COMPREHENSIVE METABOLIC PANEL - Abnormal; Notable for the following components:      Result Value   Sodium 134  (*)    Chloride 94 (*)    Glucose, Bld 121 (*)    BUN 32 (*)    Creatinine, Ser 7.99 (*)    Total Protein 8.7 (*)    GFR, Estimated 8 (*)    Anion gap 16 (*)    All other components within normal limits  CBC WITH DIFFERENTIAL/PLATELET - Abnormal; Notable for the following components:   RBC 4.21 (*)    Hemoglobin 12.2 (*)    HCT 38.7 (*)    Platelets 118 (*)    All other components within normal limits  LACTIC ACID, PLASMA  LACTIC ACID, PLASMA  TROPONIN I (HIGH SENSITIVITY)  TROPONIN I (HIGH SENSITIVITY)   PROCEDURES: Critical Care performed: No .1-3 Lead EKG Interpretation  Performed by: Merwyn Katos, MD Authorized by: Merwyn Katos, MD     Interpretation: normal     ECG rate:  71   ECG rate assessment: normal     Rhythm: sinus rhythm     Ectopy: none     Conduction: normal    MEDICATIONS ORDERED IN ED: Medications  norepinephrine (LEVOPHED) 4mg  in (0.016 mg/mL) premix infusion (6 mcg/min Intravenous Rate/Dose Change 02/13/23 2357)  naloxone (NARCAN) nasal spray 4 mg/0.1  mL (1 spray Nasal Provided for home use 02/13/23 1617)  naloxone [facility]) injection 0.4 mg (0.4 mg Intravenous Given 02/13/23 1630)  sodium chloride 0.9 % bolus 500 mL (0 mLs Intravenous Stopped 02/13/23 1704)  sodium chloride 0.9 % bolus 500 mL (0 mLs Intravenous Stopped 02/13/23 1734)  sodium chloride 0.9 % bolus 1,000 mL (0 mLs Intravenous Stopped 02/13/23 1917)   IMPRESSION / MDM / ASSESSMENT AND PLAN / ED COURSE  I reviewed the triage vital signs and the nursing notes.                             The patient is on the cardiac monitor to evaluate for evidence of arrhythmia and/or significant heart rate changes. Patient's presentation is most consistent with acute presentation with potential threat to life or bodily function. Patient is a 47 year old male that presents from dialysis and long-term care facility secondary to hypotension.  Patient states that he was feeling increasing back  pain and also received oxycodone upon arrival to his long-term care facility.  Patient arrives with maps in the 40s I did not respond to 2 L of IV fluids or 2 doses of Narcan.  Patient was then placed on norepinephrine with titration to MAP of above 60.  Patient is needed this norepinephrine dose since arrival and consultation was placed to critical care.  Critical care has seen and evaluated the patient and plans to titrate down on patient's Levophed with anticipation of patient's MAP staying above 60.  If this does happen, patient can be discharged home safely and if not will be admitted to the ICU.  Care of this patient will be signed out to the oncoming physician at the end of my shift.  All pertinent patient information conveyed and all questions answered.  All further care and disposition decisions will be made by the oncoming physician.   FINAL CLINICAL IMPRESSION(S) / ED DIAGNOSES   Final diagnoses:  Hypotension, unspecified hypotension type   Rx / DC Orders   ED Discharge Orders     None      Note:  This document was prepared using Dragon voice recognition software and may include unintentional dictation errors.   Merwyn Katos, MD 02/14/23 [phone]

## 2023-02-13 NOTE — ED Notes (Addendum)
BP continues to drop despite fluid resuscitation. EDP made aware. Order for levo at this time

## 2023-02-13 NOTE — ED Notes (Signed)
Levo titrated by NP Nedra Hai

## 2023-02-14 ENCOUNTER — Inpatient Hospital Stay: Admit: 2023-02-14 | Payer: 59

## 2023-02-14 ENCOUNTER — Encounter: Payer: Self-pay | Admitting: Student in an Organized Health Care Education/Training Program

## 2023-02-14 ENCOUNTER — Other Ambulatory Visit: Payer: Self-pay

## 2023-02-14 ENCOUNTER — Inpatient Hospital Stay (HOSPITAL_COMMUNITY)
Admit: 2023-02-14 | Discharge: 2023-02-14 | Disposition: A | Discharge status: Home / Self Care | Payer: 59 | Attending: Student in an Organized Health Care Education/Training Program | Admitting: Student in an Organized Health Care Education/Training Program

## 2023-02-14 DIAGNOSIS — N2581 Secondary hyperparathyroidism of renal origin: Secondary | ICD-10-CM | POA: Diagnosis present

## 2023-02-14 DIAGNOSIS — Z992 Dependence on renal dialysis: Secondary | ICD-10-CM | POA: Diagnosis not present

## 2023-02-14 DIAGNOSIS — N186 End stage renal disease: Secondary | ICD-10-CM | POA: Diagnosis present

## 2023-02-14 DIAGNOSIS — G894 Chronic pain syndrome: Secondary | ICD-10-CM | POA: Diagnosis present

## 2023-02-14 DIAGNOSIS — E1151 Type 2 diabetes mellitus with diabetic peripheral angiopathy without gangrene: Secondary | ICD-10-CM | POA: Diagnosis present

## 2023-02-14 DIAGNOSIS — G6289 Other specified polyneuropathies: Secondary | ICD-10-CM | POA: Diagnosis not present

## 2023-02-14 DIAGNOSIS — E1169 Type 2 diabetes mellitus with other specified complication: Secondary | ICD-10-CM | POA: Diagnosis present

## 2023-02-14 DIAGNOSIS — E1122 Type 2 diabetes mellitus with diabetic chronic kidney disease: Secondary | ICD-10-CM | POA: Diagnosis present

## 2023-02-14 DIAGNOSIS — R579 Shock, unspecified: Secondary | ICD-10-CM | POA: Diagnosis present

## 2023-02-14 DIAGNOSIS — R571 Hypovolemic shock: Secondary | ICD-10-CM | POA: Diagnosis present

## 2023-02-14 DIAGNOSIS — R578 Other shock: Secondary | ICD-10-CM | POA: Diagnosis not present

## 2023-02-14 DIAGNOSIS — M47816 Spondylosis without myelopathy or radiculopathy, lumbar region: Secondary | ICD-10-CM | POA: Diagnosis not present

## 2023-02-14 DIAGNOSIS — G610 Guillain-Barre syndrome: Secondary | ICD-10-CM | POA: Diagnosis not present

## 2023-02-14 DIAGNOSIS — I132 Hypertensive heart and chronic kidney disease with heart failure and with stage 5 chronic kidney disease, or end stage renal disease: Secondary | ICD-10-CM | POA: Diagnosis present

## 2023-02-14 DIAGNOSIS — E663 Overweight: Secondary | ICD-10-CM | POA: Diagnosis present

## 2023-02-14 DIAGNOSIS — N529 Male erectile dysfunction, unspecified: Secondary | ICD-10-CM | POA: Diagnosis present

## 2023-02-14 DIAGNOSIS — E892 Postprocedural hypoparathyroidism: Secondary | ICD-10-CM | POA: Diagnosis present

## 2023-02-14 DIAGNOSIS — M86671 Other chronic osteomyelitis, right ankle and foot: Secondary | ICD-10-CM | POA: Diagnosis present

## 2023-02-14 DIAGNOSIS — E785 Hyperlipidemia, unspecified: Secondary | ICD-10-CM | POA: Diagnosis present

## 2023-02-14 DIAGNOSIS — I739 Peripheral vascular disease, unspecified: Secondary | ICD-10-CM | POA: Diagnosis not present

## 2023-02-14 DIAGNOSIS — E861 Hypovolemia: Secondary | ICD-10-CM | POA: Diagnosis present

## 2023-02-14 DIAGNOSIS — M8660 Other chronic osteomyelitis, unspecified site: Secondary | ICD-10-CM | POA: Diagnosis not present

## 2023-02-14 DIAGNOSIS — D631 Anemia in chronic kidney disease: Secondary | ICD-10-CM | POA: Diagnosis present

## 2023-02-14 DIAGNOSIS — M25552 Pain in left hip: Secondary | ICD-10-CM | POA: Diagnosis not present

## 2023-02-14 DIAGNOSIS — Z79899 Other long term (current) drug therapy: Secondary | ICD-10-CM | POA: Diagnosis not present

## 2023-02-14 DIAGNOSIS — I5032 Chronic diastolic (congestive) heart failure: Secondary | ICD-10-CM | POA: Diagnosis present

## 2023-02-14 DIAGNOSIS — Z6834 Body mass index (BMI) 34.0-34.9, adult: Secondary | ICD-10-CM | POA: Diagnosis not present

## 2023-02-14 DIAGNOSIS — L97519 Non-pressure chronic ulcer of other part of right foot with unspecified severity: Secondary | ICD-10-CM | POA: Diagnosis present

## 2023-02-14 DIAGNOSIS — D696 Thrombocytopenia, unspecified: Secondary | ICD-10-CM | POA: Diagnosis present

## 2023-02-14 DIAGNOSIS — M869 Osteomyelitis, unspecified: Secondary | ICD-10-CM | POA: Diagnosis not present

## 2023-02-14 DIAGNOSIS — E11621 Type 2 diabetes mellitus with foot ulcer: Secondary | ICD-10-CM | POA: Diagnosis present

## 2023-02-14 DIAGNOSIS — E2740 Unspecified adrenocortical insufficiency: Secondary | ICD-10-CM | POA: Diagnosis present

## 2023-02-14 LAB — ECHOCARDIOGRAM COMPLETE
AR max vel: 2.21 cm2
AV Area VTI: 2.57 cm2
AV Area mean vel: 2.4 cm2
AV Mean grad: 5 mm[Hg]
AV Peak grad: 8 mm[Hg]
Ao pk vel: 1.41 m/s
Area-P 1/2: 2.22 cm2
Height: 74 in
MV VTI: 2.69 cm2
S' Lateral: 3.2 cm
Weight: 4204.61 [oz_av]

## 2023-02-14 LAB — GLUCOSE, CAPILLARY: Glucose-Capillary: 93 mg/dL (ref 70–99)

## 2023-02-14 LAB — CBC
HCT: 34.1 % — ABNORMAL LOW (ref 39.0–52.0)
Hemoglobin: 11.2 g/dL — ABNORMAL LOW (ref 13.0–17.0)
MCH: 29.1 pg (ref 26.0–34.0)
MCHC: 32.8 g/dL (ref 30.0–36.0)
MCV: 88.6 fL (ref 80.0–100.0)
Platelets: 157 10*3/uL (ref 150–400)
RBC: 3.85 MIL/uL — ABNORMAL LOW (ref 4.22–5.81)
RDW: 14.5 % (ref 11.5–15.5)
WBC: 10.2 10*3/uL (ref 4.0–10.5)
nRBC: 0 % (ref 0.0–0.2)

## 2023-02-14 LAB — BASIC METABOLIC PANEL
Anion gap: 15 (ref 5–15)
BUN: 43 mg/dL — ABNORMAL HIGH (ref 6–20)
CO2: 21 mmol/L — ABNORMAL LOW (ref 22–32)
Calcium: 8 mg/dL — ABNORMAL LOW (ref 8.9–10.3)
Chloride: 100 mmol/L (ref 98–111)
Creatinine, Ser: 9.19 mg/dL — ABNORMAL HIGH (ref 0.61–1.24)
GFR, Estimated: 7 mL/min — ABNORMAL LOW (ref >60)
Glucose, Bld: 146 mg/dL — ABNORMAL HIGH (ref 70–99)
Potassium: 3.8 mmol/L (ref 3.5–5.1)
Sodium: 136 mmol/L (ref 135–145)

## 2023-02-14 LAB — LACTIC ACID, PLASMA
Lactic Acid, Venous: 1.4 mmol/L (ref 0.5–1.9)
Lactic Acid, Venous: 1.7 mmol/L (ref 0.5–1.9)
Lactic Acid, Venous: 1.8 mmol/L (ref 0.5–1.9)

## 2023-02-14 LAB — PROCALCITONIN: Procalcitonin: 3.38 ng/mL

## 2023-02-14 LAB — MAGNESIUM: Magnesium: 2 mg/dL (ref 1.7–2.4)

## 2023-02-14 LAB — MRSA NEXT GEN BY PCR, NASAL: MRSA by PCR Next Gen: NOT DETECTED

## 2023-02-14 LAB — PHOSPHORUS: Phosphorus: 3.3 mg/dL (ref 2.5–4.6)

## 2023-02-14 MED ORDER — ORAL CARE MOUTH RINSE: 15.0000 mL | OROMUCOSAL | Status: DC | PRN

## 2023-02-14 MED ORDER — PREGABALIN 25 MG PO CAPS
25.0000 mg | ORAL_CAPSULE | Freq: Every day | ORAL | Status: DC
  Administered 2023-02-14 – 2023-02-22 (×9): 25 mg via ORAL
  Filled 2023-02-14 (×9): qty 1

## 2023-02-14 MED ORDER — CEFTAZIDIME IV (FOR PTA / DISCHARGE USE ONLY): 2.0000 g | Freq: Once | INTRAVENOUS | Status: DC

## 2023-02-14 MED ORDER — SODIUM CHLORIDE 0.9 % IV SOLN: 250.0000 mL | INTRAVENOUS | Status: AC

## 2023-02-14 MED ORDER — DOCUSATE SODIUM 100 MG PO CAPS: 100.0000 mg | ORAL_CAPSULE | Freq: Two times a day (BID) | ORAL | Status: DC | PRN

## 2023-02-14 MED ORDER — METRONIDAZOLE 500 MG PO TABS
500.0000 mg | ORAL_TABLET | Freq: Two times a day (BID) | ORAL | Status: DC
  Administered 2023-02-14 – 2023-02-19 (×11): 500 mg via ORAL
  Filled 2023-02-14 (×13): qty 1

## 2023-02-14 MED ORDER — MIDODRINE HCL 5 MG PO TABS
10.0000 mg | ORAL_TABLET | Freq: Three times a day (TID) | ORAL | Status: DC
  Administered 2023-02-14 – 2023-02-15 (×7): 10 mg via ORAL
  Filled 2023-02-14 (×7): qty 2

## 2023-02-14 MED ORDER — ATORVASTATIN CALCIUM 20 MG PO TABS
40.0000 mg | ORAL_TABLET | Freq: Every day | ORAL | Status: DC
  Administered 2023-02-14 – 2023-02-22 (×9): 40 mg via ORAL
  Filled 2023-02-14 (×9): qty 2

## 2023-02-14 MED ORDER — CEFTAZIDIME IV (FOR PTA / DISCHARGE USE ONLY): 1.0000 g | Freq: Once | INTRAVENOUS | Status: DC

## 2023-02-14 MED ORDER — CALCIUM CARBONATE 1250 (500 CA) MG PO TABS
4.0000 | ORAL_TABLET | Freq: Once | ORAL | Status: AC
  Administered 2023-02-14: 5000 mg via ORAL
  Filled 2023-02-14: qty 4

## 2023-02-14 MED ORDER — CLOPIDOGREL BISULFATE 75 MG PO TABS
75.0000 mg | ORAL_TABLET | Freq: Every day | ORAL | Status: DC
  Administered 2023-02-14 – 2023-02-22 (×9): 75 mg via ORAL
  Filled 2023-02-14 (×9): qty 1

## 2023-02-14 MED ORDER — CHLORHEXIDINE GLUCONATE CLOTH 2 % EX PADS
6.0000 | MEDICATED_PAD | Freq: Every day | CUTANEOUS | Status: DC
Start: 2023-02-14 — End: 2023-02-14

## 2023-02-14 MED ORDER — ZINC SULFATE 220 (50 ZN) MG PO CAPS
220.0000 mg | ORAL_CAPSULE | Freq: Every day | ORAL | Status: DC
  Administered 2023-02-14 – 2023-02-22 (×9): 220 mg via ORAL
  Filled 2023-02-14 (×9): qty 1

## 2023-02-14 MED ORDER — HEPARIN SODIUM (PORCINE) 5000 UNIT/ML IJ SOLN
5000.0000 [IU] | Freq: Three times a day (TID) | INTRAMUSCULAR | Status: DC
  Administered 2023-02-14 – 2023-02-20 (×18): 5000 [IU] via SUBCUTANEOUS
  Filled 2023-02-14 (×21): qty 1

## 2023-02-14 MED ORDER — OXYCODONE-ACETAMINOPHEN 5-325 MG PO TABS
1.0000 | ORAL_TABLET | ORAL | Status: DC | PRN
  Administered 2023-02-15 – 2023-02-16 (×2): 1 via ORAL
  Administered 2023-02-17 – 2023-02-22 (×11): 2 via ORAL
  Filled 2023-02-14: qty 2
  Filled 2023-02-14: qty 1
  Filled 2023-02-14: qty 2
  Filled 2023-02-14: qty 1
  Filled 2023-02-14 (×9): qty 2

## 2023-02-14 MED ORDER — VITAMIN C 500 MG PO TABS
500.0000 mg | ORAL_TABLET | Freq: Two times a day (BID) | ORAL | Status: DC
  Administered 2023-02-14 – 2023-02-22 (×17): 500 mg via ORAL
  Filled 2023-02-14 (×17): qty 1

## 2023-02-14 MED ORDER — CHLORHEXIDINE GLUCONATE CLOTH 2 % EX PADS
6.0000 | MEDICATED_PAD | Freq: Every day | CUTANEOUS | Status: DC
  Administered 2023-02-14 – 2023-02-21 (×8): 6 via TOPICAL

## 2023-02-14 MED ORDER — ASPIRIN 81 MG PO TBEC
81.0000 mg | DELAYED_RELEASE_TABLET | Freq: Every day | ORAL | Status: DC
  Administered 2023-02-14 – 2023-02-22 (×9): 81 mg via ORAL
  Filled 2023-02-14 (×9): qty 1

## 2023-02-14 MED ORDER — POLYETHYLENE GLYCOL 3350 17 G PO PACK: 17.0000 g | PACK | Freq: Every day | ORAL | Status: DC | PRN

## 2023-02-14 MED ORDER — SODIUM CHLORIDE 0.9 % IV SOLN
1.0000 g | INTRAVENOUS | Status: DC
  Administered 2023-02-15 – 2023-02-20 (×3): 1 g via INTRAVENOUS
  Filled 2023-02-14 (×3): qty 1

## 2023-02-14 NOTE — Consult Note (Addendum)
WOC consult received to place NPWT back on R foot. Patient is well known to [facility] team from previous admission when patient had excisional debridement ulcer to R foot 5th metatarsal 01/23/2023 by Dr. Ether Griffins.  WOC team was following him while admitted for NPWT M-Thursday.    No WOC nurse on campus at this time. Requested that bedside RN remove NPWT dressing if it is in place and place a normal saline wet to dry dressing twice daily until [facility] team can come and replace NPWT Thursday 02/15/2023.    Discussed with bedside nurse and primary MD via secure chat.  Also discussed with bedside nurse that family member SHOULD NOT bring in home vac.  Per policy we do not place home vacs on patients, must use inpatient [employer] vac.    WOC team will plan to resume NPWT dressing changes Mon-Thurs while inpatient.   Thank you,    Priscella Mann MSN, RN-BC, [employer] [phone]

## 2023-02-14 NOTE — Discharge Planning (Signed)
[facility]EMODIAL[facility] Outpatient Facility  [facility]  [address]  [place] [address] [phone]  Scheduled days: Tuesday Thursday and Saturday  Treatment time: 5:00am  Dimas Chyle Dialysis Coordinator II  [facility] Cell: [phone] eFax: [phone] [email or web address]

## 2023-02-14 NOTE — ED Provider Notes (Signed)
-----------------------------------------   12:45 AM on 02/14/2023 -----------------------------------------   Patient remains hypotensive, remains on Levophed.  I have personally reviewed patient's records and note blood pressure readings from earlier this month with MAPs above 75.  Discussed again with CCU intensivist who will evaluate patient for admission.   Irean Hong, MD 02/14/23 [phone]

## 2023-02-14 NOTE — ED Notes (Signed)
Report called to ICU

## 2023-02-14 NOTE — H&P (Signed)
NAME:  Patrick Downs, MRN:  [id number], DOB:  Nov 27, 1975, LOS: 0 ADMISSION DATE:  02/13/2023, CONSULTATION DATE:  02/17/23 REFERRING MD:  D. Sung, CHIEF COMPLAINT:  Hypotension  History of Present Illness:  47 yo M presenting to [facility] ED from [facility] for evaluation of hypotension after dialysis session.  History provided per chart review and patient bedside report. Patient was in his normal state of health attending his regular scheduled hemodialysis session.  Patient reports that during dialysis his blood pressure was reading on the lower side, however it has the patient was asymptomatic nursing continued with dialysis pulling 3.5 L outpatient.  He was then transported to [facility], where he has been since his most recent discharge on 01/31/2023.  Upon arrival to [facility] he was administered Percocet per his chronic pain outpatient regimen.  EMS was then called due to SBP in the 50's. Upon arrival to ED patient reported complaints of dizziness, however on bedside interview patient denied any recent complaints.  He denied fever/chills, nausea/vomiting/diarrhea/abdominal pain, denied chest pain/dyspnea, denied increased redness or swelling in extremities, denied blurred vision/headache/dizziness/lightheadedness/falls, weakness/numbness/paresthesias.  The patient reports his only complaint is chronic intermittent back pain that has been worsening progressively since 2016. 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  Outpatient visits with PCP describe somewhat labile hemodynamics where the patient has required clonidine and midodrine from time to time, however hemodynamics which were stable with maps in the 70s outpatient. Of note patient was at rehab for assistance due to osteomyelitis and right foot requiring debridement, wound VAC and no weightbearing.  ED course: Upon arrival alert and oriented, hypotensive with maps in the 40s.  He received 2 L IV fluid  resuscitation due to concerns for hypovolemia postdialysis.  He also received Narcan due to concerns that this could be because of Percocet administration.  Ultimately patient was started on Levophed drip.  Due to asymptomatic nature of hypotension PCCM consulted for collaboration, lactic acid checked which was WNL.  Other labs unremarkable: No leukocytosis, mild thrombocytopenia with renal function at baseline and no electrolyte derangements.  Attempts to wean down Levophed drip unsuccessful, PCCM consulted for admission. Medications given: Narcan, 2 L IV fluid bolus Initial Vitals: 97.8, 14, 82, 61/34 with a MAP of 43 and 96% on room air Significant labs: (Labs/ Imaging personally reviewed) I, Cheryll Cockayne Rust-Chester, AGACNP-BC, personally viewed and interpreted this ECG. EKG Interpretation: Date: 02/13/2023, EKG Time: 16:11, Rate: 87, Rhythm: NSR, QRS Axis: Normal, Intervals: Normal, ST/T Wave abnormalities: None, Narrative Interpretation: NSR Chemistry: Na+: 134, K+: 4.5, BUN/Cr.:  32/7.99, Serum CO2/ AG: 24/16, Cl: 94 Hematology: WBC: 9.1, Hgb: 12.2, plt: 118 Troponin: 10 > 12, Lactic: 1.9> 1.7  PCCM consulted for admission due to circulatory shock due to unknown etiology in the setting of recent hemodialysis and end-stage renal disease.  Pertinent  Medical History  HFpEF ESRD (TThSa) Katheran Awe syndrome Hypertension DDD Hyperparathyroidism Significant Hospital Events: Including procedures, antibiotic start and stop dates in addition to other pertinent events   02/14/23: Admit to ICU with circulatory shock requiring peripheral vasopressor support due to unknown etiology in the setting of recent hemodialysis and ESRD.  Interim History / Subjective:  Patient alert and oriented on room air, with soft BP on peripheral vasopressor support Plan of care discussed, all questions and concerns answered at this time.  Objective   Blood pressure (!) 83/51, pulse 81, temperature 98.2 F (36.8  C), temperature source Oral, resp. rate 14, SpO2 96%.  Intake/Output Summary (Last 24 hours) at 02/14/2023 0051 Last data filed at 02/13/2023 1917 Gross per 24 hour  Intake 1000 ml  Output --  Net 1000 ml   There were no vitals filed for this visit.  Examination: General: Adult male, acutely ill, lying in bed, NAD HEENT: MM pink/moist, anicteric, atraumatic, neck supple Neuro: A&O x 4, able to follow commands, PERRL +3, MAE CV: s1s2 RRR, NSR on monitor, no r/m/g Pulm: Regular, non labored on room air, breath sounds clear-BUL & clear/ diminished-BLL GI: soft, rounded, non tender, bs x 4 GU: Anuric Skin: Wrapped RLE where wound VAC was in place, removed prior to leaving rehab earlier today Extremities: warm/dry, pulses + 2 R/P, no edema noted  Resolved Hospital Problem list     Assessment & Plan:  Circulatory Shock secondary to possible hypovolemia in the setting recent hemodialysis Patient with documented history of chronic hypotension alternating with hypertension requiring midodrine outpatient, however not currently on midodrine and recent outpatient blood pressures have been stable. 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 -Continue outpatient Percocet as hemodynamics allow -Continue Lyrica daily  Best Practice (right click and "Reselect all SmartList Selections" daily)  Diet/type: Regular consistency (see orders) DVT prophylaxis: prophylactic heparin  GI prophylaxis: N/A Lines: N/A Foley:  N/A Code Status:  full code Last date of multidisciplinary goals of care discussion [02/14/23]  Labs   CBC: Recent Labs  Lab 02/13/23 1624  WBC 9.1  NEUTROABS 6.7  HGB 12.2*  HCT 38.7*  MCV 91.9  PLT 118*    Basic Metabolic Panel: Recent Labs  Lab 02/13/23 1624  NA 134*  K 4.5  CL 94*  CO2 24  GLUCOSE 121*  BUN 32*  CREATININE 7.99*  CALCIUM 9.1   GFR: Estimated Creatinine Clearance: 16.2 mL/min (A) (by C-G formula based on SCr of 7.99 mg/dL (H)). Recent Labs  Lab 02/13/23 1624 02/13/23 2232 02/13/23 2347  WBC 9.1  --   --   LATICACIDVEN  --  1.9 1.7    Liver Function Tests: Recent Labs  Lab 02/13/23 1624  AST 27  ALT 30  ALKPHOS 82  BILITOT 0.7  PROT 8.7*  ALBUMIN 3.9   No results for input(s): "LIPASE", "AMYLASE" in the last 168 hours. No results for input(s): "AMMONIA" in the last 168 hours.  ABG    Component Value Date/Time   PHART 7.408 09/12/2014 1305   PCO2ART 48.0 (H) 09/12/2014 1305   PO2ART 41.2 (L) 09/12/2014 1305   HCO3 29.0 (H) 07/31/2022 2000   TCO2 24 04/08/2020 1129   ACIDBASEDEF 7.6 (H) 07/31/2022 1530   O2SAT  59.7 07/31/2022 2000     Coagulation Profile: No results for input(s): "INR", "PROTIME" in the last 168 hours.  Cardiac Enzymes: No results for input(s): "CKTOTAL", "CKMB", "CKMBINDEX", "TROPONINI" in the last 168 hours.  HbA1C: Hgb A1c MFr Bld  Date/Time Value Ref Range Status  01/21/2023 02:35 PM  6.0 (H) 4.8 - 5.6 % Final    Comment:    (NOTE) Pre diabetes:          5.7%-6.4%  Diabetes:              >6.4%  Glycemic control for   <7.0% adults with diabetes   05/14/2020 08:24 PM 5.4 4.8 - 5.6 % Final    Comment:    (NOTE) Pre diabetes:          5.7%-6.4%  Diabetes:              >6.4%  Glycemic control for   <7.0% adults with diabetes     CBG: No results for input(s): "GLUCAP" in the last 168 hours.  Review of Systems: Positives in BOLD  Gen: Denies fever, chills, weight change, fatigue, night sweats, intermittent back pain HEENT: Denies blurred vision, double vision, hearing loss, tinnitus, sinus congestion, rhinorrhea, sore throat, neck stiffness, dysphagia PULM: Denies shortness of breath, cough, sputum production, hemoptysis, wheezing CV: Denies chest pain, edema, orthopnea, paroxysmal nocturnal dyspnea, palpitations GI: Denies abdominal pain, nausea, vomiting, diarrhea, hematochezia, melena, constipation, change in bowel habits GU: Denies dysuria, hematuria, polyuria, oliguria, urethral discharge Endocrine: Denies hot or cold intolerance, polyuria, polyphagia or appetite change Derm: Denies rash, dry skin, scaling or peeling skin change Heme: Denies easy bruising, bleeding, bleeding gums Neuro: Denies headache, numbness, weakness, slurred speech, loss of memory or consciousness  Past Medical History:  He,  has a past medical history of CHF (congestive heart failure) (HCC), Degenerative disc disease, lumbar, Depression, Dialysis patient (HCC), ESRD (end stage renal disease) on dialysis (HCC), Failure to thrive in adult, GERD (gastroesophageal reflux disease), Gout, Guillain Barr syndrome (HCC), Guillain Barr syndrome (HCC), Heart murmur, Hip pain, chronic, left (12/24/2020), HTN (hypertension), Hyperparathyroidism (HCC), Kidney failure, MRSA (methicillin resistant staph aureus) culture positive, Peripheral neuropathy, Pneumonia, Renal insufficiency, and Respiratory  failure (HCC).   Surgical History:   Past Surgical History:  Procedure Laterality Date   A/V FISTULAGRAM Right 01/07/2018   Procedure: A/V FISTULAGRAM;  Surgeon: Annice Needy, MD;  Location: [facility];  Service: Cardiovascular;  Laterality: Right;   AMPUTATION TOE Right 06/08/2017   Procedure: AMPUTATION TOE RIGHT FIFTH TOE;  Surgeon: Gwyneth Revels, DPM;  Location: [facility];  Service: Podiatry;  Laterality: Right;   AMPUTATION TOE Left 05/17/2018   Procedure: RAY LEFT 5TH;  Surgeon: Gwyneth Revels, DPM;  Location: [facility];  Service: Podiatry;  Laterality: Left;   AMPUTATION TOE Right 03/05/2020   Procedure: AMPUTATION TOE IPJ X 2 RIGHT GREAT AND 3RD;  Surgeon: Gwyneth Revels, DPM;  Location: [facility];  Service: Podiatry;  Laterality: Right;   AV FISTULA PLACEMENT     x5      2 graphs   AV FISTULA PLACEMENT Right 04/08/2020   Procedure: INSERTION OF ARTERIOVENOUS (AV) GORE-TEX GRAFT ARM ( ARTEGRAFT);  Surgeon: Annice Needy, MD;  Location: [facility];  Service: Vascular;  Laterality: Right;   DIALYSIS/PERMA CATHETER INSERTION N/A 01/20/2019   Procedure: DIALYSIS/PERMA CATHETER INSERTION;  Surgeon: Annice Needy, MD;  Location: [facility];  Service: Cardiovascular;  Laterality: N/A;   DIALYSIS/PERMA CATHETER REMOVAL N/A 06/17/2018  Procedure: DIALYSIS/PERMA CATHETER REMOVAL;  Surgeon: Annice Needy, MD;  Location: [facility];  Service: Cardiovascular;  Laterality: N/A;   IRRIGATION AND DEBRIDEMENT FOOT Right 01/23/2023   Procedure: IRRIGATION AND DEBRIDEMENT FOOT;  Surgeon: Gwyneth Revels, DPM;  Location: [facility];  Service: Orthopedics/Podiatry;  Laterality: Right;   LOWER EXTREMITY ANGIOGRAPHY Right 01/24/2023   Procedure: Lower Extremity Angiography;  Surgeon: Annice Needy, MD;  Location: [facility];  Service: Cardiovascular;  Laterality: Right;   PARATHYROIDECTOMY     PERIPHERAL VASCULAR THROMBECTOMY Right 01/22/2019   Procedure: PERIPHERAL VASCULAR  THROMBECTOMY;  Surgeon: Annice Needy, MD;  Location: [facility];  Service: Cardiovascular;  Laterality: Right;   REMOVAL OF GRAFT Right 05/16/2020   Procedure: REMOVAL OF GRAFT;  Surgeon: Fransisco Hertz, MD;  Location: [facility];  Service: Vascular;  Laterality: Right;   RENAL BIOPSY     REVISON OF ARTERIOVENOUS FISTULA Right 02/07/2018   Procedure: REVISON OF ARTERIOVENOUS FISTULA;  Surgeon: Annice Needy, MD;  Location: [facility];  Service: Vascular;  Laterality: Right;   TEE WITHOUT CARDIOVERSION N/A 01/22/2018   Procedure: TRANSESOPHAGEAL ECHOCARDIOGRAM (TEE);  Surgeon: Laurier Nancy, MD;  Location: [facility];  Service: Cardiovascular;  Laterality: N/A;   tonsiilectomy     TONSILLECTOMY     tracheotomy     UPPER EXTREMITY VENOGRAPHY Bilateral 02/23/2020   Procedure: UPPER EXTREMITY VENOGRAPHY;  Surgeon: Annice Needy, MD;  Location: [facility];  Service: Cardiovascular;  Laterality: Bilateral;     Social History:   reports that he has never smoked. He has never used smokeless tobacco. He reports current drug use. Drug: Marijuana. He reports that he does not drink alcohol.   Family History:  His family history includes Diabetes Mellitus II in his father; Kidney disease in his father; Kidney failure in his paternal grandfather. There is no history of Prostate cancer, Kidney cancer, or Bladder Cancer.   Allergies Allergies  Allergen Reactions   Hepatitis B Vaccine     Other reaction(s): Unknown   Ondansetron Other (See Comments)    Stomach pain    Minoxidil Other (See Comments)    "put fluid around my heart", PERICARDIAL EFFUSION   Morphine And Codeine Other (See Comments)    Aggressive    Omnipaque [Iohexol] Itching and Other (See Comments)    Rigors on one occasion, widespread itching on a separate occasion (resolved with Benadryl), tremors     Home Medications  Prior to Admission medications   Medication Sig Start Date End Date Taking? Authorizing Provider   acetaminophen (TYLENOL) 500 MG tablet Take 1,000-1,500 mg by mouth 2 (two) times daily as needed for moderate pain or headache.   Yes [provider]  ascorbic acid (VITAMIN C) 500 MG tablet Take 1 tablet (500 mg total) by mouth 2 (two) times daily. 01/29/23  Yes Pennie Banter, DO  aspirin EC 81 MG tablet Take 1 tablet (81 mg total) by mouth daily. Swallow whole. 01/29/23 01/29/24 Yes Esaw Grandchild A, DO  atorvastatin (LIPITOR) 40 MG tablet Take 1 tablet (40 mg total) by mouth daily. 01/29/23 01/29/24 Yes Pennie Banter, DO  cefTAZidime (FORTAZ) IVPB Inject 1 g into the vein every Tuesday AND 1 g every Thursday AND 2 g every Saturday. Do all this for 20 days. Indication:  Osteomyelitis First Dose: Yes Last Day of Therapy:  02/19/23 Labs - Once weekly:  CBC/D and CMP, Labs - Once weekly: ESR and CRP Method  of administration: IV Push Method of administration may be changed at the discretion of home infusion pharmacist based upon assessment of the patient and/or caregiver's ability to self-administer the medication ordered.. 01/30/23 02/19/23 Yes Esaw Grandchild A, DO  clopidogrel (PLAVIX) 75 MG tablet Take 1 tablet (75 mg total) by mouth daily. 01/29/23 01/29/24 Yes Esaw Grandchild A, DO  hydrOXYzine (ATARAX) 25 MG tablet Take 1 tablet by mouth 2 (two) times daily. 12/02/20  Yes [provider]  metroNIDAZOLE (FLAGYL) 500 MG tablet Take 1 tablet (500 mg total) by mouth 2 (two) times daily for 21 days. 01/29/23 02/19/23 Yes Esaw Grandchild A, DO  multivitamin (RENA-VIT) TABS tablet Take 1 tablet by mouth at bedtime. 01/29/23  Yes Pennie Banter, DO  oxyCODONE-acetaminophen (PERCOCET) 10-325 MG tablet Take 1 tablet by mouth every 4 (four) hours as needed for pain. 01/22/23 02/21/23 Yes Yevette Edwards, MD  pregabalin (LYRICA) 25 MG capsule Take by mouth.   Yes [provider]  sildenafil (VIAGRA) 100 MG tablet Take 100 mg by mouth daily as needed for erectile dysfunction.  08/20/21  Yes [provider]  zinc sulfate 220 (50 Zn) MG capsule Take 1 capsule (220 mg total) by mouth daily. 01/29/23  Yes Pennie Banter, DO  nutrition supplement, JUVEN, (JUVEN) PACK Take 1 packet by mouth 2 (two) times daily between meals. 01/29/23   Pennie Banter, DO  Nutritional Supplements (,FEEDING SUPPLEMENT, PROSOURCE PLUS) liquid Take 30 mLs by mouth 3 (three) times daily between meals. 01/29/23   Pennie Banter, DO     Critical care time: 68 minutes       Betsey Holiday, AGACNP-BC Acute Care Nurse Practitioner [facility]   [phone] / [phone] Please see Amion for details.

## 2023-02-14 NOTE — Progress Notes (Addendum)
[facility]  ROUNDING NOTE   Subjective:   [name] was admitted to [facility] on 02/13/2023 for Shock circulatory [facility]) [R57.9] Hypotension, unspecified hypotension type [I95.9]  Patient is known to our practice and receives outpatient dialysis at [facility] on a TTS schedule, supervised by [facility] physicians. He received dialysis yesterday, though terminated 20 min early due to hypotension. Patient sates he felt fine at dialysis, but began sweating, trembling, and dizziness at rehab. Chart review states he returned to rehab and was given pain medication, unaware of hypotension.   Labs on ED arrival significant for sodium 134, glucose 121, creatinine 7.99 with GFR 8, Hgb 12.2. Hypotensive on ED arrival, 56/37.   We have been consulted to manage dialysis needs during this admission.   Objective:  Vital signs in last 24 hours:  Temp:  [97.8 F (36.6 C)-98.5 F (36.9 C)] 98.5 F (36.9 C) (10/23 0800) Pulse Rate:  [53-110] 69 (10/23 0930) Resp:  [10-22] 13 (10/23 0930) BP: (56-165)/(26-107) 120/48 (10/23 0930) SpO2:  [81 %-100 %] 97 % (10/23 0930) Weight:  [119.2 kg] 119.2 kg (10/23 0230)  Weight change:  Filed Weights   02/14/23 0230  Weight: 119.2 kg    Intake/Output: I/O last 3 completed shifts: In: 1537.2 [I.V.:537.2; IV Piggyback:1000] Out: 0    Intake/Output this shift:  Total I/O In: 76.5 [I.V.:76.5] Out: -   Physical Exam: General: No acute distress  Head: Normocephalic, atraumatic. Moist oral mucosal membranes  Lungs:  Clear to auscultation, normal effort  Heart: S1S2 no rubs  Abdomen:  Soft, nontender, bowel sounds present  Extremities: No peripheral edema.  Neurologic: Awake, alert, following commands  Skin: No acute rash, RLE wound  Access: Right IJ PermCath    Basic Metabolic Panel: Recent Labs  Lab 02/13/23 1624 02/14/23 0558  NA 134* 136  K 4.5 3.8  CL 94* 100  CO2 24 21*  GLUCOSE 121* 146*  BUN 32* 43*  CREATININE  7.99* 9.19*  CALCIUM 9.1 8.0*  MG  --  2.0  PHOS  --  3.3    Liver Function Tests: Recent Labs  Lab 02/13/23 1624  AST 27  ALT 30  ALKPHOS 82  BILITOT 0.7  PROT 8.7*  ALBUMIN 3.9   No results for input(s): "LIPASE", "AMYLASE" in the last 168 hours. No results for input(s): "AMMONIA" in the last 168 hours.  CBC: Recent Labs  Lab 02/13/23 1624 02/14/23 0558  WBC 9.1 10.2  NEUTROABS 6.7  --   HGB 12.2* 11.2*  HCT 38.7* 34.1*  MCV 91.9 88.6  PLT 118* 157    Cardiac Enzymes: No results for input(s): "CKTOTAL", "CKMB", "CKMBINDEX", "TROPONINI" in the last 168 hours.  BNP: Invalid input(s): "POCBNP"  CBG: Recent Labs  Lab 02/14/23 0235  GLUCAP 93     Microbiology: Results for orders placed or performed during the hospital encounter of 02/13/23  MRSA Next Gen by PCR, Nasal     Status: None   Collection Time: 02/14/23  2:15 AM   Specimen: Nasal Mucosa; Nasal Swab  Result Value Ref Range Status   MRSA by PCR Next Gen NOT DETECTED NOT DETECTED Final    Comment: (NOTE) The GeneXpert MRSA Assay (FDA approved for NASAL specimens only), is one component of a comprehensive MRSA colonization surveillance program. It is not intended to diagnose MRSA infection nor to guide or monitor treatment for MRSA infections. Test performance is not FDA approved in patients less than 28 years old. Performed at [facility], [address] [place], [place] [address]     Coagulation Studies: No results for input(s): "LABPROT", "INR" in the last 72 hours.   Urinalysis: No results for input(s): "COLORURINE", "LABSPEC", "PHURINE", "GLUCOSEU", "HGBUR", "BILIRUBINUR", "KETONESUR", "PROTEINUR", "UROBILINOGEN", "NITRITE", "LEUKOCYTESUR" in the last 72 hours.  Invalid input(s): "APPERANCEUR"    Imaging: No results found.   Medications:    sodium chloride     [START ON 02/15/2023] cefTAZidime (FORTAZ)  IV     norepinephrine (LEVOPHED) Adult infusion 11 mcg/min  (02/14/23 0900)    ascorbic acid  500 mg Oral BID   aspirin EC  81 mg Oral Daily   atorvastatin  40 mg Oral Daily   Chlorhexidine Gluconate Cloth  6 each Topical QHS   clopidogrel  75 mg Oral Daily   heparin  5,000 Units Subcutaneous Q8H   metroNIDAZOLE  500 mg Oral BID   midodrine  10 mg Oral TID WC   pregabalin  25 mg Oral Daily   zinc sulfate  220 mg Oral Daily   docusate sodium, mouth rinse, oxyCODONE-acetaminophen, polyethylene glycol  Assessment/ Plan:  47 y.o. male ESRD on HD TTS, CHF, history of Guillain-Barr syndrome, hypertension, anemia of chronic kidney disease, secondary hyperparathyroidism, history of right foot toe amputation who presents with ulceration on lateral aspect of right foot.  [facility] [address]  1.  ESRD on HD TTS.  Adequate dialysis received yesterday prior to ED arrival. Next treatment scheduled for Thursday.   2.  Anemia of chronic kidney disease.   Lab Results  Component Value Date   HGB 11.2 (L) 02/14/2023   Patient receives Mircera as outpatient. Hgb within optimal range. No need for ESAs  3.  Secondary hyperparathyroidism with outpatient labs: PTH 204, calcium 6.9, and phos 7.6 on 02/06/23 - Prescribed auryxia with meals outpatient  4.  Diabetes mellitus type II with chronic kidney disease and complication of right foot osteomyelitis:  - Continue ceftazidime 1g with HD treatments  5. Hypotension, believed due to hypovolemia and sedation. Placed in ICU with Levophed drip and midodrine.    LOS: 0 Danae Oland 10/23/202410:11 AM

## 2023-02-14 NOTE — Consult Note (Signed)
PHARMACY CONSULT NOTE - ELECTROLYTES  Pharmacy Consult for Electrolyte Monitoring and Replacement   Recent Labs: Height: 6\' 2"  (188 cm) Weight: 119.2 kg (262 lb 12.6 oz) IBW/kg (Calculated) : 82.2 Estimated Creatinine Clearance: 13.8 mL/min (A) (by C-G formula based on SCr of 9.19 mg/dL (H)).  Potassium (mmol/L)  Date Value  02/14/2023 3.8  08/22/2014 5.1   Magnesium (mg/dL)  Date Value  40/98/1191 2.0  08/22/2014 2.3   Calcium (mg/dL)  Date Value  47/82/9562 8.0 (L)   Calcium, Total (mg/dL)  Date Value  13/11/6576 7.1 (L)   Albumin (g/dL)  Date Value  46/96/2952 3.9  08/19/2014 2.1 (L)   Phosphorus (mg/dL)  Date Value  84/13/2440 3.3  08/22/2014 6.0 (H)   Sodium (mmol/L)  Date Value  02/14/2023 136  08/22/2014 136   Corrected Ca: 8.08 mg/dL  Assessment  AWAD HOGLUND is a 47 y.o. male presenting with hypotension after dialysis session. PMH significant for HFpEF, ESRD, HTN, hyperparathyroidism, and Guillain-Barr syndrome. Pharmacy has been consulted to monitor and replace electrolytes.  Goal of Therapy: Electrolytes WNL  Plan:  Corrected calcium 8.08; will order calcium carbonate 1250 mg x 4 tablets  No other electrolyte replacement warranted at this time Check BMP, Mg, Phos with AM labs  Thank you for allowing pharmacy to be a part of this patient's care.  Littie Deeds, PharmD Pharmacy Resident  02/14/2023 7:18 AM

## 2023-02-14 NOTE — Progress Notes (Signed)
eLink Physician-Brief Progress Note Patient Name: Patrick Downs DOB: [birth year] MRN: [id number]   Date of Service  02/14/2023  HPI/Events of Note  Patient with ESRD on chronic hemodialysis admitted with post-dialysis hypotension, likely secondary to a combination of relative hypovolemia and vasoplegia.  eICU Interventions  New Patient Evaluation.        Thomasene Lot Anaka Beazer 02/14/2023, 3:11 AM

## 2023-02-14 NOTE — Plan of Care (Signed)
  Problem: Clinical Measurements: Goal: Will remain free from infection Outcome: Progressing   Problem: Clinical Measurements: Goal: Respiratory complications will improve Outcome: Progressing   Problem: Activity: Goal: Risk for activity intolerance will decrease Outcome: Progressing   Problem: Coping: Goal: Level of anxiety will decrease Outcome: Progressing   Problem: Elimination: Goal: Will not experience complications related to urinary retention Outcome: Progressing

## 2023-02-15 ENCOUNTER — Inpatient Hospital Stay: Payer: 59 | Admitting: Infectious Diseases

## 2023-02-15 DIAGNOSIS — E1122 Type 2 diabetes mellitus with diabetic chronic kidney disease: Secondary | ICD-10-CM | POA: Diagnosis not present

## 2023-02-15 DIAGNOSIS — N186 End stage renal disease: Secondary | ICD-10-CM | POA: Diagnosis not present

## 2023-02-15 DIAGNOSIS — M8660 Other chronic osteomyelitis, unspecified site: Secondary | ICD-10-CM | POA: Diagnosis not present

## 2023-02-15 DIAGNOSIS — E1169 Type 2 diabetes mellitus with other specified complication: Secondary | ICD-10-CM | POA: Diagnosis not present

## 2023-02-15 DIAGNOSIS — Z992 Dependence on renal dialysis: Secondary | ICD-10-CM

## 2023-02-15 DIAGNOSIS — R579 Shock, unspecified: Secondary | ICD-10-CM | POA: Diagnosis not present

## 2023-02-15 LAB — RENAL FUNCTION PANEL
Albumin: 3.4 g/dL — ABNORMAL LOW (ref 3.5–5.0)
Anion gap: 16 — ABNORMAL HIGH (ref 5–15)
BUN: 57 mg/dL — ABNORMAL HIGH (ref 6–20)
CO2: 20 mmol/L — ABNORMAL LOW (ref 22–32)
Calcium: 8.5 mg/dL — ABNORMAL LOW (ref 8.9–10.3)
Chloride: 103 mmol/L (ref 98–111)
Creatinine, Ser: 11.37 mg/dL — ABNORMAL HIGH (ref 0.61–1.24)
GFR, Estimated: 5 mL/min — ABNORMAL LOW (ref >60)
Glucose, Bld: 105 mg/dL — ABNORMAL HIGH (ref 70–99)
Phosphorus: 4 mg/dL (ref 2.5–4.6)
Potassium: 4.6 mmol/L (ref 3.5–5.1)
Sodium: 139 mmol/L (ref 135–145)

## 2023-02-15 LAB — MAGNESIUM: Magnesium: 2.3 mg/dL (ref 1.7–2.4)

## 2023-02-15 LAB — CBC
HCT: 36 % — ABNORMAL LOW (ref 39.0–52.0)
Hemoglobin: 11.5 g/dL — ABNORMAL LOW (ref 13.0–17.0)
MCH: 28.8 pg (ref 26.0–34.0)
MCHC: 31.9 g/dL (ref 30.0–36.0)
MCV: 90.2 fL (ref 80.0–100.0)
Platelets: 148 10*3/uL — ABNORMAL LOW (ref 150–400)
RBC: 3.99 MIL/uL — ABNORMAL LOW (ref 4.22–5.81)
RDW: 14.6 % (ref 11.5–15.5)
WBC: 7.8 10*3/uL (ref 4.0–10.5)
nRBC: 0 % (ref 0.0–0.2)

## 2023-02-15 MED ORDER — HEPARIN SODIUM (PORCINE) 1000 UNIT/ML IJ SOLN
1000.0000 [IU] | INTRAMUSCULAR | Status: DC | PRN
  Administered 2023-02-15 (×2): 1000 [IU] via INTRAVENOUS

## 2023-02-15 NOTE — Progress Notes (Signed)
Transition of Care [facility]) - Inpatient Brief Assessment   Patient Details  Name: Patrick Downs MRN: [id number] Date of Birth: 1975-06-29  Transition of Care [facility]) CM/SW Contact:    Truddie Hidden, RN Phone Number: 02/15/2023, 3:47 PM   Clinical Narrative: Admitted YQM:[id number] R lateral foot  Admitted from: [facility]  Pharmacy: [facility]  Current home health/prior home health/DME:shower chair, walker, [facility]  Patient is agreeable to return to [facility].     Transition of Care Asessment:

## 2023-02-15 NOTE — Procedures (Signed)
Received patient in his room  Alert and oriented.  Informed consent signed and in chart.   TX duration:3.5hrs  Patient is  hypotensive. Alert, without acute distress.  Hand-off given to patient's nurse.   Access used: Right chest HD catheter.  Access issues: NONE  Total UF removed: 0 Medication(s) given: Patient was on Levophed.     [facility]

## 2023-02-15 NOTE — Plan of Care (Signed)
  Problem: Clinical Measurements: Goal: Will remain free from infection Outcome: Progressing   Problem: Clinical Measurements: Goal: Respiratory complications will improve Outcome: Progressing   Problem: Clinical Measurements: Goal: Cardiovascular complication will be avoided Outcome: Progressing   Problem: Nutrition: Goal: Adequate nutrition will be maintained Outcome: Progressing   Problem: Coping: Goal: Level of anxiety will decrease Outcome: Progressing   Problem: Elimination: Goal: Will not experience complications related to urinary retention Outcome: Progressing   Problem: Safety: Goal: Ability to remain free from injury will improve Outcome: Progressing   Problem: Skin Integrity: Goal: Risk for impaired skin integrity will decrease Outcome: Progressing

## 2023-02-15 NOTE — Progress Notes (Signed)
[facility]  ROUNDING NOTE   Subjective:   [name] was admitted to [facility] on 02/13/2023 for Shock circulatory [facility]) [R57.9] Hypotension, unspecified hypotension type [I95.9]  Patient is known to our practice and receives outpatient dialysis at [facility] on a TTS schedule, supervised by [facility] physicians.   Update: Patient seen sitting up in bed States he feels better  Denies dizziness and lightheadedness  Remains on Levo  Objective:  Vital signs in last 24 hours:  Temp:  [97.6 F (36.4 C)-98.7 F (37.1 C)] 98.7 F (37.1 C) (10/24 0827) Pulse Rate:  [26-196] 81 (10/24 0930) Resp:  [4-27] 18 (10/24 1000) BP: (77-125)/(38-95) 112/58 (10/24 1000) SpO2:  [88 %-100 %] 96 % (10/24 0914) Weight:  [121 kg-121.1 kg] 121.1 kg (10/24 0900)  Weight change: 1.8 kg Filed Weights   02/14/23 0230 02/15/23 0451 02/15/23 0900  Weight: 119.2 kg 121 kg 121.1 kg    Intake/Output: I/O last 3 completed shifts: In: 2402.7 [P.O.:360; I.V.:1042.7; IV Piggyback:1000] Out: 0    Intake/Output this shift:  No intake/output data recorded.  Physical Exam: General: No acute distress  Head: Normocephalic, atraumatic. Moist oral mucosal membranes  Lungs:  Clear to auscultation, normal effort  Heart: S1S2 no rubs  Abdomen:  Soft, nontender, bowel sounds present  Extremities: No peripheral edema.  Neurologic: Awake, alert, following commands  Skin: No acute rash, RLE wound  Access: Right IJ PermCath    Basic Metabolic Panel: Recent Labs  Lab 02/13/23 1624 02/14/23 0558 02/15/23 0328  NA 134* 136 139  K 4.5 3.8 4.6  CL 94* 100 103  CO2 24 21* 20*  GLUCOSE 121* 146* 105*  BUN 32* 43* 57*  CREATININE 7.99* 9.19* 11.37*  CALCIUM 9.1 8.0* 8.5*  MG  --  2.0 2.3  PHOS  --  3.3 4.0    Liver Function Tests: Recent Labs  Lab 02/13/23 1624 02/15/23 0328  AST 27  --   ALT 30  --   ALKPHOS 82  --   BILITOT 0.7  --   PROT 8.7*  --   ALBUMIN 3.9 3.4*    No results for input(s): "LIPASE", "AMYLASE" in the last 168 hours. No results for input(s): "AMMONIA" in the last 168 hours.  CBC: Recent Labs  Lab 02/13/23 1624 02/14/23 0558 02/15/23 0328  WBC 9.1 10.2 7.8  NEUTROABS 6.7  --   --   HGB 12.2* 11.2* 11.5*  HCT 38.7* 34.1* 36.0*  MCV 91.9 88.6 90.2  PLT 118* 157 148*    Cardiac Enzymes: No results for input(s): "CKTOTAL", "CKMB", "CKMBINDEX", "TROPONINI" in the last 168 hours.  BNP: Invalid input(s): "POCBNP"  CBG: Recent Labs  Lab 02/14/23 0235  GLUCAP 93     Microbiology: Results for orders placed or performed during the hospital encounter of 02/13/23  MRSA Next Gen by PCR, Nasal     Status: None   Collection Time: 02/14/23  2:15 AM   Specimen: Nasal Mucosa; Nasal Swab  Result Value Ref Range Status   MRSA by PCR Next Gen NOT DETECTED NOT DETECTED Final    Comment: (NOTE) The GeneXpert MRSA Assay (FDA approved for NASAL specimens only), is one component of a comprehensive MRSA colonization surveillance program. It is not intended to diagnose MRSA infection nor to guide or monitor treatment for MRSA infections. Test performance is not FDA approved in patients less than 33 years old. Performed at [facility], [address] [place], [place] [address]  Coagulation Studies: No results for input(s): "LABPROT", "INR" in the last 72 hours.   Urinalysis: No results for input(s): "COLORURINE", "LABSPEC", "PHURINE", "GLUCOSEU", "HGBUR", "BILIRUBINUR", "KETONESUR", "PROTEINUR", "UROBILINOGEN", "NITRITE", "LEUKOCYTESUR" in the last 72 hours.  Invalid input(s): "APPERANCEUR"    Imaging: ECHOCARDIOGRAM COMPLETE  Result Date: 02/14/2023    ECHOCARDIOGRAM REPORT   Patient Name:   Patrick Downs Date of Exam: 02/14/2023 Medical Rec #:  [id number]       Height:       74.0 in Accession #:    [id number]      Weight:       262.8 lb Date of Birth:  1975/10/03       BSA:          2.441 m Patient Age:    47  years        BP:           83/49 mmHg Patient Gender: M               HR:           74 bpm. Exam Location:  [facility] Procedure: 2D Echo, Cardiac Doppler and Color Doppler STAT ECHO Indications:     Shock R57.9  History:         Patient has prior history of Echocardiogram examinations, most                  recent 01/22/2018. CHF, Signs/Symptoms:Murmur; Risk                  Factors:Hypertension. ESRD.  Sonographer:     Cristela Blue Referring Phys:  [id number] KHABIB DGAYLI Diagnosing Phys: Julien Nordmann MD IMPRESSIONS  1. Left ventricular ejection fraction, by estimation, is 60 to 65%. The left ventricle has normal function. The left ventricle has no regional wall motion abnormalities. Left ventricular diastolic parameters are consistent with Grade I diastolic dysfunction (impaired relaxation).  2. Right ventricular systolic function is normal. The right ventricular size is normal. There is normal pulmonary artery systolic pressure. The estimated right ventricular systolic pressure is 15.9 mmHg.  3. The mitral valve is normal in structure. No evidence of mitral valve regurgitation. No evidence of mitral stenosis.  4. The aortic valve is tricuspid. Aortic valve regurgitation is not visualized. No aortic stenosis is present. FINDINGS  Left Ventricle: Left ventricular ejection fraction, by estimation, is 60 to 65%. The left ventricle has normal function. The left ventricle has no regional wall motion abnormalities. The left ventricular internal cavity size was normal in size. There is  no left ventricular hypertrophy. Left ventricular diastolic parameters are consistent with Grade I diastolic dysfunction (impaired relaxation). Right Ventricle: The right ventricular size is normal. No increase in right ventricular wall thickness. Right ventricular systolic function is normal. There is normal pulmonary artery systolic pressure. The tricuspid regurgitant velocity is 1.65 m/s, and  with an assumed right atrial pressure of 5 mmHg,  the estimated right ventricular systolic pressure is 15.9 mmHg. Left Atrium: Left atrial size was normal in size. Right Atrium: Right atrial size was normal in size. Pericardium: There is no evidence of pericardial effusion. Mitral Valve: The mitral valve is normal in structure. No evidence of mitral valve regurgitation. No evidence of mitral valve stenosis. MV peak gradient, 4.4 mmHg. The mean mitral valve gradient is 2.0 mmHg. Tricuspid Valve: The tricuspid valve is normal in structure. Tricuspid valve regurgitation is not demonstrated. No evidence of tricuspid stenosis. Aortic Valve: The aortic valve is tricuspid. Aortic  valve regurgitation is not visualized. No aortic stenosis is present. Aortic valve mean gradient measures 5.0 mmHg. Aortic valve peak gradient measures 8.0 mmHg. Aortic valve area, by VTI measures 2.57 cm. Pulmonic Valve: The pulmonic valve was normal in structure. Pulmonic valve regurgitation is not visualized. No evidence of pulmonic stenosis. Aorta: The aortic root is normal in size and structure. Venous: The inferior vena cava was not well visualized. The inferior vena cava is normal in size with greater than 50% respiratory variability, suggesting right atrial pressure of 3 mmHg. IAS/Shunts: No atrial level shunt detected by color flow Doppler.  LEFT VENTRICLE PLAX 2D LVIDd:         4.40 cm   Diastology LVIDs:         3.20 cm   LV e' medial:    9.14 cm/s LV PW:         1.10 cm   LV E/e' medial:  6.7 LV IVS:        1.50 cm   LV e' lateral:   15.20 cm/s LVOT diam:     2.00 cm   LV E/e' lateral: 4.0 LV SV:         80 LV SV Index:   33 LVOT Area:     3.14 cm  RIGHT VENTRICLE RV Basal diam:  3.70 cm RV Mid diam:    3.30 cm LEFT ATRIUM             Index        RIGHT ATRIUM           Index LA diam:        4.00 cm 1.64 cm/m   RA Area:     13.20 cm LA Vol (A2C):   36.3 ml 14.87 ml/m  RA Volume:   31.70 ml  12.99 ml/m LA Vol (A4C):   31.8 ml 13.03 ml/m LA Biplane Vol: 33.8 ml 13.85 ml/m   AORTIC VALVE AV Area (Vmax):    2.21 cm AV Area (Vmean):   2.40 cm AV Area (VTI):     2.57 cm AV Vmax:           141.00 cm/s AV Vmean:          98.300 cm/s AV VTI:            0.311 m AV Peak Grad:      8.0 mmHg AV Mean Grad:      5.0 mmHg LVOT Vmax:         99.00 cm/s LVOT Vmean:        75.200 cm/s LVOT VTI:          0.254 m LVOT/AV VTI ratio: 0.82  AORTA Ao Root diam: 3.20 cm MITRAL VALVE               TRICUSPID VALVE MV Area (PHT): 2.22 cm    TR Peak grad:   10.9 mmHg MV Area VTI:   2.69 cm    TR Vmax:        165.00 cm/s MV Peak grad:  4.4 mmHg MV Mean grad:  2.0 mmHg    SHUNTS MV Vmax:       1.05 m/s    Systemic VTI:  0.25 m MV Vmean:      61.0 cm/s   Systemic Diam: 2.00 cm MV Decel Time: 341 msec MV E velocity: 60.80 cm/s MV A velocity: 86.50 cm/s MV E/A ratio:  0.70 Julien Nordmann MD Electronically signed by Julien Nordmann MD Signature  Date/Time: 02/14/2023/2:39:30 PM    Final      Medications:    cefTAZidime (FORTAZ)  IV     norepinephrine (LEVOPHED) Adult infusion 4 mcg/min (02/15/23 0432)    ascorbic acid  500 mg Oral BID   aspirin EC  81 mg Oral Daily   atorvastatin  40 mg Oral Daily   Chlorhexidine Gluconate Cloth  6 each Topical QHS   clopidogrel  75 mg Oral Daily   heparin  5,000 Units Subcutaneous Q8H   metroNIDAZOLE  500 mg Oral BID   midodrine  10 mg Oral TID WC   pregabalin  25 mg Oral Daily   zinc sulfate  220 mg Oral Daily   docusate sodium, mouth rinse, oxyCODONE-acetaminophen, polyethylene glycol  Assessment/ Plan:  47 y.o. male ESRD on HD TTS, CHF, history of Guillain-Barr syndrome, hypertension, anemia of chronic kidney disease, secondary hyperparathyroidism, history of right foot toe amputation who presents with ulceration on lateral aspect of right foot.  [facility] [address]  1.  ESRD on HD TTS.  Dialysis scheduled for today, UF goal 1-1.5L as tolerated. Next treatment scheduled for Saturday.  2.  Anemia of chronic kidney disease.   Lab  Results  Component Value Date   HGB 11.5 (L) 02/15/2023   Patient receives Mircera as outpatient. Hgb acceptable  3.  Secondary hyperparathyroidism with outpatient labs: PTH 204, calcium 6.9, and phos 7.6 on 02/06/23 - Prescribed auryxia with meals outpatient  4.  Diabetes mellitus type II with chronic kidney disease and complication of right foot osteomyelitis:  - Continue ceftazidime 1g with HD treatments  5. Hypotension, believed due to hypovolemia and sedation. Placed in ICU with Levophed drip and midodrine. Goal is to wean pressors and maintain Midodrine.    LOS: 1 Kellyn Mccary 10/24/202410:08 AM

## 2023-02-15 NOTE — Consult Note (Signed)
PHARMACY CONSULT NOTE - ELECTROLYTES  Pharmacy Consult for Electrolyte Monitoring and Replacement   Recent Labs: Height: 6\' 2"  (188 cm) Weight: 121 kg (266 lb 12.1 oz) IBW/kg (Calculated) : 82.2 Estimated Creatinine Clearance: 11.2 mL/min (A) (by C-G formula based on SCr of 11.37 mg/dL (H)).  Potassium (mmol/L)  Date Value  02/15/2023 4.6  08/22/2014 5.1   Magnesium (mg/dL)  Date Value  28/41/3244 2.3  08/22/2014 2.3   Calcium (mg/dL)  Date Value  04/26/7251 8.5 (L)   Calcium, Total (mg/dL)  Date Value  66/44/0347 7.1 (L)   Albumin (g/dL)  Date Value  42/59/5638 3.4 (L)  08/19/2014 2.1 (L)   Phosphorus (mg/dL)  Date Value  75/64/3329 4.0  08/22/2014 6.0 (H)   Sodium (mmol/L)  Date Value  02/15/2023 139  08/22/2014 136   Corrected Ca: 8.98 mg/dL  Assessment  Patrick Downs is a 47 y.o. male presenting with hypotension after dialysis session. PMH significant for HFpEF, ESRD, HTN, hyperparathyroidism, and Guillain-Barr syndrome. Pharmacy has been consulted to monitor and replace electrolytes.  Goal of Therapy: Electrolytes WNL  Plan:  Electrolyte replacement is not warranted at this time Check BMP, Mg, Phos with AM labs  Thank you for allowing pharmacy to be a part of this patient's care.  Littie Deeds, PharmD Pharmacy Resident  02/15/2023 7:15 AM

## 2023-02-15 NOTE — Plan of Care (Signed)
[facility] Tonsillectomy/Adenoidectomy, Postoperative PEDS care plan entered in error.

## 2023-02-15 NOTE — Consult Note (Signed)
NAME: Patrick Downs  DOB: 02-23-1976  MRN: [id number]  Date/Time: 02/15/2023 3:23 PM  REQUESTING PROVIDER: Harlon Ditty Subjective:  REASON FOR CONSULT: rt foot infection  ? Patrick Downs is a 47 y.o. 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 HE underwent on 10/1 the following  Excisional debridement ulcer to bone right foot fifth metatarsal 2.  Bone biopsy right fifth metatarsal 3.  Placement of wound VAC right foot 4. Placement of antibiotic beads right foot. On 01/24/23 he had angio and stent placement rt distal SFA and above knee popliteal artery. 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 HE came to the ED the same day as he could not go up the steps and was sent to [facility]. HE went for dialysis on Tuesday and had hypotension and it was stopped 20 min early- [facility] and BP dropped further and he was brought to the ED and admitted to ICU and on pressor-   Vitals   02/13/23 16:22  BP 56/37 (L)  Temp 97.8 F (36.6 C)    02/13/23 16:27  Pulse Rate 90  Resp 15  SpO2 94 %    Latest Reference Range & Units 02/13/23 16:24  WBC 4.0 - 10.5 K/uL 9.1  Hemoglobin 13.0 - 17.0 g/dL 40.9 (L)  HCT 81.1 - 91.4 % 38.7 (L)  Platelets 150 - 400 K/uL 118 (L)  Creatinine 0.61 - 1.24 mg/dL 7.82 (H)    pt has no fever , but feels cold, feeling okay now. No cough or SOB- says his BP is always low I am asked to see him for his foot infection as he was  supposed to come to my clinic as OP today but was instead admitted HE has a wound vac that was changed today    Past Medical History:  Diagnosis Date   CHF (congestive heart failure) (HCC)    Degenerative disc disease, lumbar    Depression    Dialysis patient (HCC)    ESRD (end stage renal disease) on dialysis (HCC)    Failure to thrive in adult    GERD (gastroesophageal reflux disease)    Gout    Guillain Barr syndrome (HCC)    Guillain Barr syndrome (HCC)    Heart murmur    Hip pain, chronic, left 12/24/2020   HTN (hypertension)    Hyperparathyroidism (HCC)    Kidney failure    MRSA (methicillin resistant staph aureus) culture positive    Peripheral neuropathy    Pneumonia    Renal insufficiency    Respiratory failure [facility])     Past Surgical History:  Procedure Laterality Date   A/V FISTULAGRAM Right 01/07/2018   Procedure: A/V FISTULAGRAM;  Surgeon: Annice Needy, MD;  Location: [facility];  Service: Cardiovascular;  Laterality: Right;   AMPUTATION TOE Right 06/08/2017   Procedure: AMPUTATION TOE RIGHT FIFTH TOE;  Surgeon: Gwyneth Revels, DPM;  Location: [facility];  Service: Podiatry;  Laterality: Right;   AMPUTATION TOE Left 05/17/2018  Procedure: RAY LEFT 5TH;  Surgeon: Gwyneth Revels, DPM;  Location: [facility];  Service: Podiatry;  Laterality: Left;   AMPUTATION TOE Right 03/05/2020   Procedure: AMPUTATION TOE IPJ X 2 RIGHT GREAT AND 3RD;  Surgeon: Gwyneth Revels, DPM;  Location: [facility];  Service: Podiatry;  Laterality: Right;   AV FISTULA PLACEMENT     x5      2 graphs   AV FISTULA PLACEMENT Right 04/08/2020   Procedure: INSERTION OF ARTERIOVENOUS (AV) GORE-TEX GRAFT ARM ( ARTEGRAFT);  Surgeon: Annice Needy, MD;  Location: [facility];  Service: Vascular;  Laterality: Right;   DIALYSIS/PERMA CATHETER INSERTION N/A 01/20/2019   Procedure: DIALYSIS/PERMA CATHETER INSERTION;  Surgeon: Annice Needy, MD;  Location: [facility];  Service: Cardiovascular;   Laterality: N/A;   DIALYSIS/PERMA CATHETER REMOVAL N/A 06/17/2018   Procedure: DIALYSIS/PERMA CATHETER REMOVAL;  Surgeon: Annice Needy, MD;  Location: [facility];  Service: Cardiovascular;  Laterality: N/A;   IRRIGATION AND DEBRIDEMENT FOOT Right 01/23/2023   Procedure: IRRIGATION AND DEBRIDEMENT FOOT;  Surgeon: Gwyneth Revels, DPM;  Location: [facility];  Service: Orthopedics/Podiatry;  Laterality: Right;   LOWER EXTREMITY ANGIOGRAPHY Right 01/24/2023   Procedure: Lower Extremity Angiography;  Surgeon: Annice Needy, MD;  Location: [facility];  Service: Cardiovascular;  Laterality: Right;   PARATHYROIDECTOMY     PERIPHERAL VASCULAR THROMBECTOMY Right 01/22/2019   Procedure: PERIPHERAL VASCULAR THROMBECTOMY;  Surgeon: Annice Needy, MD;  Location: [facility];  Service: Cardiovascular;  Laterality: Right;   REMOVAL OF GRAFT Right 05/16/2020   Procedure: REMOVAL OF GRAFT;  Surgeon: Fransisco Hertz, MD;  Location: [facility];  Service: Vascular;  Laterality: Right;   RENAL BIOPSY     REVISON OF ARTERIOVENOUS FISTULA Right 02/07/2018   Procedure: REVISON OF ARTERIOVENOUS FISTULA;  Surgeon: Annice Needy, MD;  Location: [facility];  Service: Vascular;  Laterality: Right;   TEE WITHOUT CARDIOVERSION N/A 01/22/2018   Procedure: TRANSESOPHAGEAL ECHOCARDIOGRAM (TEE);  Surgeon: Laurier Nancy, MD;  Location: [facility];  Service: Cardiovascular;  Laterality: N/A;   tonsiilectomy     TONSILLECTOMY     tracheotomy     UPPER EXTREMITY VENOGRAPHY Bilateral 02/23/2020   Procedure: UPPER EXTREMITY VENOGRAPHY;  Surgeon: Annice Needy, MD;  Location: [facility];  Service: Cardiovascular;  Laterality: Bilateral;    Social History   Socioeconomic History   Marital status: Single    Spouse name: Not on file   Number of children: Not on file   Years of education: Not on file   Highest education level: Not on file  Occupational History   Not on file  Tobacco Use   Smoking status: Never    Smokeless tobacco: Never  Vaping Use   Vaping status: Never Used  Substance and Sexual Activity   Alcohol use: No   Drug use: Yes    Types: Marijuana   Sexual activity: Yes  Other Topics Concern   Not on file  Social History Narrative   Not on file   Social Determinants of Health   Financial Resource Strain: Low Risk  (09/11/2022)   Received from [facility], [facility]   Overall Financial Resource Strain (CARDIA)    Difficulty of Paying Living Expenses: Not very hard  Food Insecurity: No Food Insecurity (02/14/2023)   Hunger Vital Sign    Worried About Running Out of Food in the Last Year: Never true  Ran Out of Food in the Last Year: Never true  Transportation Needs: No Transportation Needs (02/14/2023)   PRAPARE - Administrator, Civil Service (Medical): No    Lack of Transportation (Non-Medical): No  Physical Activity: Not on file  Stress: No Stress Concern Present (09/11/2022)   Received from [facility], [facility]   [employer] of Occupational Health - Occupational Stress Questionnaire    Feeling of Stress : Not at all  Social Connections: Unknown (09/11/2022)   Received from [facility], [facility]   Social Connection and Isolation Panel [NHANES]    Frequency of Communication with Friends and Family: Never    Frequency of Social Gatherings with Friends and Family: Never    Attends Religious Services: Not on file    Active Member of Clubs or Organizations: Not on file    Attends Banker Meetings: Not on file    Marital Status: Not on file  Intimate Partner Violence: Not At Risk (02/14/2023)   Humiliation, Afraid, Rape, and Kick questionnaire    Fear of Current or Ex-Partner: No    Emotionally Abused: No    Physically Abused: No    Sexually Abused: No    Family History  Problem Relation Age of Onset   Diabetes Mellitus  II Father    Kidney disease Father    Kidney failure Paternal Grandfather    Prostate cancer Neg Hx    Kidney cancer Neg Hx    Bladder Cancer Neg Hx    Allergies  Allergen Reactions   Hepatitis B Vaccine     Other reaction(s): Unknown   Ondansetron Other (See Comments)    Stomach pain    Minoxidil Other (See Comments)    "put fluid around my heart", PERICARDIAL EFFUSION   Morphine And Codeine Other (See Comments)    Aggressive    Omnipaque [Iohexol] Itching and Other (See Comments)    Rigors on one occasion, widespread itching on a separate occasion (resolved with Benadryl), tremors   I? Current Facility-Administered Medications  Medication Dose Route Frequency Provider Last Rate Last Admin   ascorbic acid (VITAMIN C) tablet 500 mg  500 mg Oral BID Rust-Chester, Micheline Rough L, NP   500 mg at 02/15/23 1318   aspirin EC tablet 81 mg  81 mg Oral Daily Rust-Chester, Britton L, NP   81 mg at 02/15/23 1319   atorvastatin (LIPITOR) tablet 40 mg  40 mg Oral Daily Rust-Chester, Britton L, NP   40 mg at 02/15/23 1318   cefTAZidime (FORTAZ) 1 g in sodium chloride 0.9 % 100 mL IVPB  1 g Intravenous Q T,Th,Sa-HD Rust-Chester, Britton L, NP 200 mL/hr at 02/15/23 1332 1 g at 02/15/23 1332   Chlorhexidine Gluconate Cloth 2 % PADS 6 each  6 each Topical QHS Rust-Chester, Cecelia Byars, NP   6 each at 02/14/23 0302   clopidogrel (PLAVIX) tablet 75 mg  75 mg Oral Daily Rust-Chester, Britton L, NP   75 mg at 02/15/23 1319   docusate sodium (COLACE) capsule 100 mg  100 mg Oral BID PRN Rust-Chester, Micheline Rough L, NP       heparin injection 5,000 Units  5,000 Units Subcutaneous Q8H Rust-Chester, Britton L, NP   5,000 Units at 02/15/23 1330   heparin sodium (porcine) injection 1,000 Units  1,000 Units Intravenous PRN Wendee Beavers, NP   1,000 Units at 02/15/23 1301   metroNIDAZOLE (FLAGYL) tablet 500 mg  500 mg  Oral BID Rust-Chester, Cecelia Byars, NP   500 mg at 02/15/23 1319   midodrine (PROAMATINE) tablet 10 mg  10  mg Oral TID WC Rust-Chester, Cecelia Byars, NP   10 mg at 02/15/23 1318   norepinephrine (LEVOPHED) 4mg  in (0.016 mg/mL) premix infusion  0-10 mcg/min Intravenous Continuous Judithe Modest, NP 15 mL/hr at 02/15/23 1100 4 mcg/min at 02/15/23 1100   Oral care mouth rinse  15 mL Mouth Rinse PRN Rust-Chester, Cecelia Byars, NP       oxyCODONE-acetaminophen (PERCOCET/ROXICET) 5-325 MG per tablet 1-2 tablet  1-2 tablet Oral Q4H PRN Rust-Chester, Cecelia Byars, NP       polyethylene glycol (MIRALAX / GLYCOLAX) packet 17 g  17 g Oral Daily PRN Rust-Chester, Cecelia Byars, NP       pregabalin (LYRICA) capsule 25 mg  25 mg Oral Daily Rust-Chester, Britton L, NP   25 mg at 02/15/23 1318   zinc sulfate capsule 220 mg  220 mg Oral Daily Rust-Chester, Cecelia Byars, NP   220 mg at 02/15/23 1318     Abtx:  Anti-infectives (From admission, onward)    Start     Dose/Rate Route Frequency Ordered Stop   02/17/23 0000  cefTAZidime (FORTAZ) IVPB  Status:  Discontinued       Placed in "And" Linked Group   2 g Intravenous Once in dialysis 02/14/23 0250 02/14/23 0258   02/15/23 1200  cefTAZidime (FORTAZ) 1 g in sodium chloride 0.9 % 100 mL IVPB        1 g 200 mL/hr over 30 Minutes Intravenous Every T-Th-Sa (Hemodialysis) 02/14/23 0259     02/14/23 1000  metroNIDAZOLE (FLAGYL) tablet 500 mg        500 mg Oral 2 times daily 02/14/23 0235     02/14/23 0345  cefTAZidime (FORTAZ) IVPB  Status:  Discontinued       Placed in "And" Linked Group   1 g Intravenous Once in dialysis 02/14/23 0250 02/14/23 0258       REVIEW OF SYSTEMS:  Const: negative fever,  negative weight loss Eyes: negative diplopia or visual changes, negative eye pain ENT: negative coryza, negative sore throat Resp: negative cough, hemoptysis, dyspnea Cards: negative for chest pain, palpitations, lower extremity edema GU: negative for frequency, dysuria and hematuria GI: Negative for abdominal pain, diarrhea, bleeding, constipation Skin: negative for rash  and pruritus Heme: negative for easy bruising and gum/nose bleeding MS: negative for myalgias, arthralgias, back pain and muscle weakness Says he was getting PT and was doing better at [facility] Neurolo:negative for headaches, dizziness, vertigo, memory problems  Psych: negative for feelings of anxiety, depression  Endocrine: negative for thyroid, diabetes Allergy/Immunology- as above Objective:  VITALS:  BP (!) 129/47   Pulse 71   Temp 98.5 F (36.9 C) (Oral)   Resp 15   Ht 6\' 2"  (1.88 m)   Wt 121.1 kg   SpO2 97%   BMI 34.28 kg/m  LDA Rt internal jugular HD cath PHYSICAL EXAM:  General: Alert, cooperative, no distress, appears stated age.  Head: Normocephalic, without obvious abnormality, atraumatic. Eyes: Conjunctivae clear, anicteric sclerae. Pupils are equal ENT Nares normal. No drainage or sinus tenderness. Lips, mucosa, and tongue normal. No Thrush Neck: Supple, symmetrical, no adenopathy, thyroid: non tender no carotid bruit and no JVD. Back: No CVA tenderness. Lungs: Clear to auscultation bilaterally. No Wheezing or Rhonchi. No rales. Heart: Regular rate and rhythm, no murmur, rub or gallop. Abdomen: Soft, non-tender,not distended. Bowel sounds normal.  No masses Extremities: rt foot wound vac Skin: No rashes or lesions. Or bruising Lymph: Cervical, supraclavicular normal. Neurologic:contractures of fingers with wasting of hand muscles Pertinent Labs Lab Results CBC    Component Value Date/Time   WBC 7.8 02/15/2023 0328   RBC 3.99 (L) 02/15/2023 0328   HGB 11.5 (L) 02/15/2023 0328   HGB 7.7 (L) 08/22/2014 0653   HCT 36.0 (L) 02/15/2023 0328   HCT 23.9 (L) 08/22/2014 0653   PLT 148 (L) 02/15/2023 0328   PLT 195 08/22/2014 0653   MCV 90.2 02/15/2023 0328   MCV 92 08/22/2014 0653   MCH 28.8 02/15/2023 0328   MCHC 31.9 02/15/2023 0328   RDW 14.6 02/15/2023 0328   RDW 17.3 (H) 08/22/2014 0653   LYMPHSABS 1.1 02/13/2023 1624   LYMPHSABS 0.9 (L) 08/22/2014 0653    MONOABS 0.7 02/13/2023 1624   MONOABS 1.6 (H) 08/22/2014 0653   EOSABS 0.5 02/13/2023 1624   EOSABS 0.0 08/22/2014 0653   BASOSABS 0.1 02/13/2023 1624   BASOSABS 0.0 08/22/2014 0653       Latest Ref Rng & Units 02/15/2023    3:28 AM 02/14/2023    5:58 AM 02/13/2023    4:24 PM  CMP  Glucose 70 - 99 mg/dL 962  952  841   BUN 6 - 20 mg/dL 57  43  32   Creatinine 0.61 - 1.24 mg/dL 32.44  0.10  2.72   Sodium 135 - 145 mmol/L 139  136  134   Potassium 3.5 - 5.1 mmol/L 4.6  3.8  4.5   Chloride 98 - 111 mmol/L 103  100  94   CO2 22 - 32 mmol/L 20  21  24    Calcium 8.9 - 10.3 mg/dL 8.5  8.0  9.1   Total Protein 6.5 - 8.1 g/dL   8.7   Total Bilirubin 0.3 - 1.2 mg/dL   0.7   Alkaline Phos 38 - 126 U/L   82   AST 15 - 41 U/L   27   ALT 0 - 44 U/L   30       Microbiology: Recent Results (from the past 240 hour(s))  MRSA Next Gen by PCR, Nasal     Status: None   Collection Time: 02/14/23  2:15 AM   Specimen: Nasal Mucosa; Nasal Swab  Result Value Ref Range Status   MRSA by PCR Next Gen NOT DETECTED NOT DETECTED Final    Comment: (NOTE) The GeneXpert MRSA Assay (FDA approved for NASAL specimens only), is one component of a comprehensive MRSA colonization surveillance program. It is not intended to diagnose MRSA infection nor to guide or monitor treatment for MRSA infections. Test performance is not FDA approved in patients less than 45 years old. Performed at [facility], [address], [place], [place] [address]     IMAGING RESULTS: None  ? 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  DM  H/o Guillian barre syndrome with neurological deficit Discussed  the management with the patient.    ________________________________________________

## 2023-02-15 NOTE — Progress Notes (Signed)
NAME:  Patrick Downs, MRN:  [id number], DOB:  Jun 11, 1975, LOS: 1 ADMISSION DATE:  02/13/2023, CONSULTATION DATE:  02/17/23 REFERRING MD:  D. Dolores Frame, CHIEF COMPLAINT:  Hypotension  Brief Pt Description / Synopsis:  47 year old male with history of ESRD on hemodialysis presenting to the hospital with hypotension in the setting of hemodialysis requiring peripheral vasopressors.  Etiology of shock concerning for hypovolemia superimposed on Sildenafil.  History of Present Illness:  47 yo M presenting to [facility] ED from [facility] for evaluation of hypotension after dialysis session.  History provided per chart review and patient bedside report. Patient was in his normal state of health attending his regular scheduled hemodialysis session.  Patient reports that during dialysis his blood pressure was reading on the lower side, however it has the patient was asymptomatic nursing continued with dialysis pulling 3.5 L outpatient.  He was then transported to [facility], where he has been since his most recent discharge on 01/31/2023.  Upon arrival to [facility] he was administered Percocet per his chronic pain outpatient regimen.  EMS was then called due to SBP in the 50's. Upon arrival to ED patient reported complaints of dizziness, however on bedside interview patient denied any recent complaints.  He denied fever/chills, nausea/vomiting/diarrhea/abdominal pain, denied chest pain/dyspnea, denied increased redness or swelling in extremities, denied blurred vision/headache/dizziness/lightheadedness/falls, weakness/numbness/paresthesias.  The patient reports his only complaint is chronic intermittent back pain that has been worsening progressively since 2016. 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  Outpatient visits with PCP describe somewhat labile hemodynamics where the patient has required clonidine and midodrine from time to time, however hemodynamics which were stable with  maps in the 70s outpatient. Of note patient was at rehab for assistance due to osteomyelitis and right foot requiring debridement, wound VAC and no weightbearing.  ED course: Upon arrival alert and oriented, hypotensive with maps in the 40s.  He received 2 L IV fluid resuscitation due to concerns for hypovolemia postdialysis.  He also received Narcan due to concerns that this could be because of Percocet administration.  Ultimately patient was started on Levophed drip.  Due to asymptomatic nature of hypotension PCCM consulted for collaboration, lactic acid checked which was WNL.  Other labs unremarkable: No leukocytosis, mild thrombocytopenia with renal function at baseline and no electrolyte derangements.  Attempts to wean down Levophed drip unsuccessful, PCCM consulted for admission. Medications given: Narcan, 2 L IV fluid bolus Initial Vitals: 97.8, 14, 82, 61/34 with a MAP of 43 and 96% on room air Significant labs: (Labs/ Imaging personally reviewed) I, Cheryll Cockayne Rust-Chester, AGACNP-BC, personally viewed and interpreted this ECG. EKG Interpretation: Date: 02/13/2023, EKG Time: 16:11, Rate: 87, Rhythm: NSR, QRS Axis: Normal, Intervals: Normal, ST/T Wave abnormalities: None, Narrative Interpretation: NSR Chemistry: Na+: 134, K+: 4.5, BUN/Cr.:  32/7.99, Serum CO2/ AG: 24/16, Cl: 94 Hematology: WBC: 9.1, Hgb: 12.2, plt: 118 Troponin: 10 > 12, Lactic: 1.9> 1.7  PCCM consulted for admission due to circulatory shock due to unknown etiology in the setting of recent hemodialysis and end-stage renal disease.  Please see "Significant Hospital Events" section below for full detailed hospital course.  Pertinent  Medical History  HFpEF ESRD (TThSa) Gilliam Barr syndrome Hypertension DDD Hyperparathyroidism  Micro Data:  10/23: MRSA PCR>>negative  Antimicrobials:   Anti-infectives (From admission, onward)    Start     Dose/Rate Route Frequency Ordered Stop   02/17/23 0000  cefTAZidime  (FORTAZ) IVPB  Status:  Discontinued  Placed in "And" Linked Group   2 g Intravenous Once in dialysis 02/14/23 0250 02/14/23 0258   02/15/23 1200  cefTAZidime (FORTAZ) 1 g in sodium chloride 0.9 % 100 mL IVPB        1 g 200 mL/hr over 30 Minutes Intravenous Every T-Th-Sa (Hemodialysis) 02/14/23 0259     02/14/23 1000  metroNIDAZOLE (FLAGYL) tablet 500 mg        500 mg Oral 2 times daily 02/14/23 0235     02/14/23 0345  cefTAZidime (FORTAZ) IVPB  Status:  Discontinued       Placed in "And" Linked Group   1 g Intravenous Once in dialysis 02/14/23 0250 02/14/23 0258       Significant Hospital Events: Including procedures, antibiotic start and stop dates in addition to other pertinent events   02/14/23: Admit to ICU with circulatory shock requiring peripheral vasopressor support due to unknown etiology in the setting of recent hemodialysis and ESRD. 02/15/23: Weaning down Levophed (down to 3 mcg).  Plan for HD today.  Consult ID for right foot osteomyelitis.  Interim History / Subjective:  -No significant events noted overnight -Afebrile, remains on low dose Levophed but weaning down (currently down to 3 mcg) ~lactic is normal, pt is asymptomatic ~ continue weaning for a goal SBP >90 -Plan for HD at bedside -Leukocytosis has resolved, WBC improved to 7.8 from 10.2 -Echo yesterday with LVEF 60-65% and Grade I DD -Will consult ID for right foot osteomyelitis ~ was to see ID as outpatient today   Objective   Blood pressure (!) 96/44, pulse 80, temperature 97.7 F (36.5 C), temperature source Oral, resp. rate 16, height 6\' 2"  (1.88 m), weight 121 kg, SpO2 96%.        Intake/Output Summary (Last 24 hours) at 02/15/2023 0738 Last data filed at 02/15/2023 0432 Gross per 24 hour  Intake 625.5 ml  Output 0 ml  Net 625.5 ml   Filed Weights   02/14/23 0230 02/15/23 0451  Weight: 119.2 kg 121 kg    Examination: General: Adult male, acutely ill, sitting in bed, on room air, in  NAD HEENT: MM pink/moist, anicteric, atraumatic, neck supple Neuro: A&O x 4, able to follow commands, PERRL +3, MAE CV: s1s2 RRR, NSR on monitor, no r/m/g Pulm: Regular, non labored on room air, breath sounds clear throughout GI: soft, rounded, non tender, bs x 4 GU: Anuric Skin: Wrapped RLE where wound VAC was in place Extremities: warm/dry, pulses + 2 R/P, no edema noted  Resolved Hospital Problem list     Assessment & Plan:   #Shock: suspect secondary to possible hypovolemia in the setting recent hemodialysis +/- related to Sildenafil #Hyperlipidemia Patient with documented history of chronic hypotension alternating with hypertension requiring midodrine outpatient, however not currently on midodrine and recent outpatient blood pressures have been stable. Echocardiogram 02/14/23: LVEF 60-65%, grade I DD, RV systolic function normal -Continuous cardiac monitoring -Maintain SBP >90 -Cautious IV fluids -Vasopressors as needed to maintain MAP goal -Continue Midodrine 10 mg TID -Lactic acid is normalized -HS Troponin negative x2 -Continue Lipitor -Noted to have a prescription for sildenafil 100 mg once daily for erectile dysfunction ~ HOLD  #ESRD on iHD (TThSa) -Monitor I&O's / urinary output -Follow BMP -Ensure adequate renal perfusion -Avoid nephrotoxic agents as able -Replace electrolytes as indicated ~ Pharmacy following for assistance with electrolyte replacement -Nephrology following, appreciate input ~ HD as per Nephrology  #Osteomyelitis of the right foot status post debridement Patient reports being discharged with  wound VAC machine but is currently at [facility]. 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  Labs   CBC: Recent Labs  Lab 02/13/23 1624 02/14/23 0558 02/15/23 0328  WBC 9.1 10.2 7.8  NEUTROABS 6.7  --   --   HGB 12.2* 11.2* 11.5*  HCT 38.7* 34.1* 36.0*  MCV 91.9 88.6 90.2  PLT 118* 157 148*    Basic Metabolic Panel: Recent Labs  Lab 02/13/23 1624 02/14/23 0558 02/15/23 0328  NA 134* 136 139  K 4.5 3.8 4.6  CL 94* 100 103  CO2 24 21* 20*  GLUCOSE 121* 146* 105*  BUN 32* 43* 57*  CREATININE 7.99* 9.19* 11.37*  CALCIUM 9.1 8.0* 8.5*  MG  --  2.0 2.3  PHOS  --  3.3 4.0   GFR: Estimated Creatinine Clearance: 11.2 mL/min (A) (by C-G formula based on SCr of 11.37 mg/dL (H)). Recent Labs  Lab 02/13/23 1624 02/13/23 2232 02/13/23 2347 02/14/23 0558 02/14/23 1058 02/14/23 1331 02/15/23 0328  PROCALCITON  --   --   --  3.38  --   --   --   WBC 9.1  --   --  10.2  --   --  7.8  LATICACIDVEN  --  1.9 1.7  --  1.8 1.4  --     Liver Function Tests: Recent Labs  Lab 02/13/23 1624 02/15/23 0328  AST 27  --   ALT 30  --   ALKPHOS 82  --   BILITOT 0.7  --   PROT 8.7*  --   ALBUMIN 3.9 3.4*   No results for input(s): "LIPASE", "AMYLASE" in the last 168 hours. No results for input(s): "AMMONIA" in the last 168 hours.  ABG    Component Value Date/Time   PHART 7.408 09/12/2014 1305   PCO2ART 48.0 (H) 09/12/2014 1305   PO2ART 41.2 (L) 09/12/2014 1305   HCO3 29.0 (H) 07/31/2022 2000   TCO2 24 04/08/2020 1129   ACIDBASEDEF 7.6 (H) 07/31/2022 1530   O2SAT 59.7 07/31/2022 2000      Coagulation Profile: No results for input(s): "INR", "PROTIME" in the last 168 hours.  Cardiac Enzymes: No results for input(s): "CKTOTAL", "CKMB", "CKMBINDEX", "TROPONINI" in the last 168 hours.  HbA1C: Hgb A1c MFr Bld  Date/Time Value Ref Range Status  01/21/2023 02:35 PM 6.0 (H) 4.8 - 5.6 % Final    Comment:    (NOTE) Pre diabetes:          5.7%-6.4%  Diabetes:              >6.4%  Glycemic control for   <7.0% adults with diabetes   05/14/2020 08:24 PM 5.4 4.8 - 5.6 % Final    Comment:    (NOTE) Pre diabetes:          5.7%-6.4%  Diabetes:              >  6.4%  Glycemic control for   <7.0% adults with diabetes     CBG: Recent Labs  Lab 02/14/23 0235  GLUCAP 93    Review of Systems: Positives in BOLD  Gen: Denies fever, chills, weight change, fatigue, night sweats, intermittent back pain, currently denies all complaints HEENT: Denies blurred vision, double vision, hearing loss, tinnitus, sinus congestion, rhinorrhea, sore throat, neck stiffness, dysphagia PULM: Denies shortness of breath, cough, sputum production, hemoptysis, wheezing CV: Denies chest pain, edema, orthopnea, paroxysmal nocturnal dyspnea, palpitations GI: Denies abdominal pain, nausea, vomiting, diarrhea, hematochezia, melena, constipation, change in bowel habits GU: Denies dysuria, hematuria, polyuria, oliguria, urethral discharge Endocrine: Denies hot or cold intolerance, polyuria, polyphagia or appetite change Derm: Denies rash, dry skin, scaling or peeling skin change Heme: Denies easy bruising, bleeding, bleeding gums Neuro: Denies headache, numbness, weakness, slurred speech, loss of memory or consciousness  Past Medical History:  He,  has a past medical history of CHF (congestive heart failure) (HCC), Degenerative disc disease, lumbar, Depression, Dialysis patient (HCC), ESRD (end stage renal disease) on dialysis (HCC), Failure to thrive in adult, GERD (gastroesophageal reflux disease),  Gout, Guillain Barr syndrome (HCC), Guillain Barr syndrome (HCC), Heart murmur, Hip pain, chronic, left (12/24/2020), HTN (hypertension), Hyperparathyroidism (HCC), Kidney failure, MRSA (methicillin resistant staph aureus) culture positive, Peripheral neuropathy, Pneumonia, Renal insufficiency, and Respiratory failure (HCC).   Surgical History:   Past Surgical History:  Procedure Laterality Date   A/V FISTULAGRAM Right 01/07/2018   Procedure: A/V FISTULAGRAM;  Surgeon: Annice Needy, MD;  Location: [facility];  Service: Cardiovascular;  Laterality: Right;   AMPUTATION TOE Right 06/08/2017   Procedure: AMPUTATION TOE RIGHT FIFTH TOE;  Surgeon: Gwyneth Revels, DPM;  Location: [facility];  Service: Podiatry;  Laterality: Right;   AMPUTATION TOE Left 05/17/2018   Procedure: RAY LEFT 5TH;  Surgeon: Gwyneth Revels, DPM;  Location: [facility];  Service: Podiatry;  Laterality: Left;   AMPUTATION TOE Right 03/05/2020   Procedure: AMPUTATION TOE IPJ X 2 RIGHT GREAT AND 3RD;  Surgeon: Gwyneth Revels, DPM;  Location: [facility];  Service: Podiatry;  Laterality: Right;   AV FISTULA PLACEMENT     x5      2 graphs   AV FISTULA PLACEMENT Right 04/08/2020   Procedure: INSERTION OF ARTERIOVENOUS (AV) GORE-TEX GRAFT ARM ( ARTEGRAFT);  Surgeon: Annice Needy, MD;  Location: [facility];  Service: Vascular;  Laterality: Right;   DIALYSIS/PERMA CATHETER INSERTION N/A 01/20/2019   Procedure: DIALYSIS/PERMA CATHETER INSERTION;  Surgeon: Annice Needy, MD;  Location: [facility];  Service: Cardiovascular;  Laterality: N/A;   DIALYSIS/PERMA CATHETER REMOVAL N/A 06/17/2018   Procedure: DIALYSIS/PERMA CATHETER REMOVAL;  Surgeon: Annice Needy, MD;  Location: [facility];  Service: Cardiovascular;  Laterality: N/A;   IRRIGATION AND DEBRIDEMENT FOOT Right 01/23/2023   Procedure: IRRIGATION AND DEBRIDEMENT FOOT;  Surgeon: Gwyneth Revels, DPM;  Location: [facility];  Service: Orthopedics/Podiatry;  Laterality: Right;    LOWER EXTREMITY ANGIOGRAPHY Right 01/24/2023   Procedure: Lower Extremity Angiography;  Surgeon: Annice Needy, MD;  Location: [facility];  Service: Cardiovascular;  Laterality: Right;   PARATHYROIDECTOMY     PERIPHERAL VASCULAR THROMBECTOMY Right 01/22/2019   Procedure: PERIPHERAL VASCULAR THROMBECTOMY;  Surgeon: Annice Needy, MD;  Location: [facility];  Service: Cardiovascular;  Laterality: Right;   REMOVAL OF GRAFT Right 05/16/2020   Procedure: REMOVAL OF GRAFT;  Surgeon: Fransisco Hertz, MD;  Location: [facility];  Service:  Vascular;  Laterality: Right;   RENAL BIOPSY     REVISON OF ARTERIOVENOUS FISTULA Right 02/07/2018   Procedure: REVISON OF ARTERIOVENOUS FISTULA;  Surgeon: Annice Needy, MD;  Location: [facility];  Service: Vascular;  Laterality: Right;   TEE WITHOUT CARDIOVERSION N/A 01/22/2018   Procedure: TRANSESOPHAGEAL ECHOCARDIOGRAM (TEE);  Surgeon: Laurier Nancy, MD;  Location: [facility];  Service: Cardiovascular;  Laterality: N/A;   tonsiilectomy     TONSILLECTOMY     tracheotomy     UPPER EXTREMITY VENOGRAPHY Bilateral 02/23/2020   Procedure: UPPER EXTREMITY VENOGRAPHY;  Surgeon: Annice Needy, MD;  Location: [facility];  Service: Cardiovascular;  Laterality: Bilateral;     Social History:   reports that he has never smoked. He has never used smokeless tobacco. He reports current drug use. Drug: Marijuana. He reports that he does not drink alcohol.   Family History:  His family history includes Diabetes Mellitus II in his father; Kidney disease in his father; Kidney failure in his paternal grandfather. There is no history of Prostate cancer, Kidney cancer, or Bladder Cancer.   Allergies Allergies  Allergen Reactions   Hepatitis B Vaccine     Other reaction(s): Unknown   Ondansetron Other (See Comments)    Stomach pain    Minoxidil Other (See Comments)    "put fluid around my heart", PERICARDIAL EFFUSION   Morphine And Codeine Other (See Comments)     Aggressive    Omnipaque [Iohexol] Itching and Other (See Comments)    Rigors on one occasion, widespread itching on a separate occasion (resolved with Benadryl), tremors     Home Medications  Prior to Admission medications   Medication Sig Start Date End Date Taking? Authorizing Provider  acetaminophen (TYLENOL) 500 MG tablet Take 1,000-1,500 mg by mouth 2 (two) times daily as needed for moderate pain or headache.   Yes [provider]  ascorbic acid (VITAMIN C) 500 MG tablet Take 1 tablet (500 mg total) by mouth 2 (two) times daily. 01/29/23  Yes Pennie Banter, DO  aspirin EC 81 MG tablet Take 1 tablet (81 mg total) by mouth daily. Swallow whole. 01/29/23 01/29/24 Yes Esaw Grandchild A, DO  atorvastatin (LIPITOR) 40 MG tablet Take 1 tablet (40 mg total) by mouth daily. 01/29/23 01/29/24 Yes Pennie Banter, DO  cefTAZidime (FORTAZ) IVPB Inject 1 g into the vein every Tuesday AND 1 g every Thursday AND 2 g every Saturday. Do all this for 20 days. Indication:  Osteomyelitis First Dose: Yes Last Day of Therapy:  02/19/23 Labs - Once weekly:  CBC/D and CMP, Labs - Once weekly: ESR and CRP Method of administration: IV Push Method of administration may be changed at the discretion of home infusion pharmacist based upon assessment of the patient and/or caregiver's ability to self-administer the medication ordered.. 01/30/23 02/19/23 Yes Esaw Grandchild A, DO  clopidogrel (PLAVIX) 75 MG tablet Take 1 tablet (75 mg total) by mouth daily. 01/29/23 01/29/24 Yes Esaw Grandchild A, DO  hydrOXYzine (ATARAX) 25 MG tablet Take 1 tablet by mouth 2 (two) times daily. 12/02/20  Yes [provider]  metroNIDAZOLE (FLAGYL) 500 MG tablet Take 1 tablet (500 mg total) by mouth 2 (two) times daily for 21 days. 01/29/23 02/19/23 Yes Esaw Grandchild A, DO  multivitamin (RENA-VIT) TABS tablet Take 1 tablet by mouth at bedtime. 01/29/23  Yes Pennie Banter, DO  oxyCODONE-acetaminophen (PERCOCET)  10-325 MG tablet Take 1 tablet by mouth every 4 (four) hours as  needed for pain. 01/22/23 02/21/23 Yes Yevette Edwards, MD  pregabalin (LYRICA) 25 MG capsule Take by mouth.   Yes [provider]  sildenafil (VIAGRA) 100 MG tablet Take 100 mg by mouth daily as needed for erectile dysfunction. 08/20/21  Yes [provider]  zinc sulfate 220 (50 Zn) MG capsule Take 1 capsule (220 mg total) by mouth daily. 01/29/23  Yes Pennie Banter, DO  nutrition supplement, JUVEN, (JUVEN) PACK Take 1 packet by mouth 2 (two) times daily between meals. 01/29/23   Pennie Banter, DO  Nutritional Supplements (,FEEDING SUPPLEMENT, PROSOURCE PLUS) liquid Take 30 mLs by mouth 3 (three) times daily between meals. 01/29/23   Pennie Banter, DO     Critical care time: 40 minutes    Harlon Ditty, AGACNP-BC [facility] Prefer epic messenger for cross cover needs If after hours, please call E-link

## 2023-02-15 NOTE — Consult Note (Addendum)
WOC Nurse Consult Note: Reason for Consult: Consult requested to apply Vac dressing to right foot.  Pt is familiar to [facility] team from previous admission and has been followed by podiatry team in the past.  Right outer foot with full thickness wound; 3.8X3.8X.3cm, 70% red, 30% yellow in the center.  Small amt pink drainage, no odor.  Pt was medicated for pain prior to the procedure and tolerated without apparent discomfort. Applied one piece black foam to cont suction. Applied ace wrap padding underneath track pad, then more ace wrap to hold in place.  Pt states the facility where he was previously staying told him his right heel was "boggy."Assessed the location and skin is intact and no drainage or fluctuance is noted. Foam dressing applied and Prevalon boot requested to reduce pressure per the patient's request.  Dressing procedure/placement/frequency: WOC team will plan to change the Vac again on Mon if patient is still in the hospital at that time. Vac supplies left at the bedside.  Topical treatment orders provided for bedside nurses to perform as follows: When patient is discharged: remove NPWT dressing, apply saline moistened gauze to wound bed. Cover with dry gauze and secure with ace wrap. Pt should have Vac dressing reapplied at facility. Thank-you,  Cammie Mcgee MSN, RN, CWOCN, [place], CNS [phone]

## 2023-02-15 NOTE — Plan of Care (Signed)
  Problem: Clinical Measurements: Goal: Ability to maintain clinical measurements within normal limits will improve Outcome: Progressing Goal: Will remain free from infection Outcome: Progressing Goal: Cardiovascular complication will be avoided Outcome: Progressing   Problem: Activity: Goal: Risk for activity intolerance will decrease Outcome: Progressing

## 2023-02-16 DIAGNOSIS — M8660 Other chronic osteomyelitis, unspecified site: Secondary | ICD-10-CM | POA: Diagnosis not present

## 2023-02-16 DIAGNOSIS — E1122 Type 2 diabetes mellitus with diabetic chronic kidney disease: Secondary | ICD-10-CM | POA: Diagnosis not present

## 2023-02-16 DIAGNOSIS — N186 End stage renal disease: Secondary | ICD-10-CM | POA: Diagnosis not present

## 2023-02-16 DIAGNOSIS — L089 Local infection of the skin and subcutaneous tissue, unspecified: Secondary | ICD-10-CM

## 2023-02-16 DIAGNOSIS — E1169 Type 2 diabetes mellitus with other specified complication: Secondary | ICD-10-CM | POA: Diagnosis not present

## 2023-02-16 DIAGNOSIS — R579 Shock, unspecified: Secondary | ICD-10-CM | POA: Diagnosis not present

## 2023-02-16 LAB — BASIC METABOLIC PANEL
Anion gap: 13 (ref 5–15)
BUN: 47 mg/dL — ABNORMAL HIGH (ref 6–20)
CO2: 24 mmol/L (ref 22–32)
Calcium: 7.8 mg/dL — ABNORMAL LOW (ref 8.9–10.3)
Chloride: 98 mmol/L (ref 98–111)
Creatinine, Ser: 10.02 mg/dL — ABNORMAL HIGH (ref 0.61–1.24)
GFR, Estimated: 6 mL/min — ABNORMAL LOW (ref >60)
Glucose, Bld: 146 mg/dL — ABNORMAL HIGH (ref 70–99)
Potassium: 3.9 mmol/L (ref 3.5–5.1)
Sodium: 135 mmol/L (ref 135–145)

## 2023-02-16 LAB — MAGNESIUM: Magnesium: 1.9 mg/dL (ref 1.7–2.4)

## 2023-02-16 LAB — CBC
HCT: 33.3 % — ABNORMAL LOW (ref 39.0–52.0)
Hemoglobin: 10.9 g/dL — ABNORMAL LOW (ref 13.0–17.0)
MCH: 28.8 pg (ref 26.0–34.0)
MCHC: 32.7 g/dL (ref 30.0–36.0)
MCV: 87.9 fL (ref 80.0–100.0)
Platelets: 126 10*3/uL — ABNORMAL LOW (ref 150–400)
RBC: 3.79 MIL/uL — ABNORMAL LOW (ref 4.22–5.81)
RDW: 14.6 % (ref 11.5–15.5)
WBC: 5.7 10*3/uL (ref 4.0–10.5)
nRBC: 0 % (ref 0.0–0.2)

## 2023-02-16 LAB — LACTIC ACID, PLASMA: Lactic Acid, Venous: 1.3 mmol/L (ref 0.5–1.9)

## 2023-02-16 LAB — PHOSPHORUS: Phosphorus: 3.6 mg/dL (ref 2.5–4.6)

## 2023-02-16 LAB — CORTISOL-AM, BLOOD: Cortisol - AM: 4.8 ug/dL — ABNORMAL LOW (ref 6.7–22.6)

## 2023-02-16 MED ORDER — HEPARIN SODIUM (PORCINE) 1000 UNIT/ML DIALYSIS
25.0000 [IU]/kg | INTRAMUSCULAR | Status: DC | PRN
Start: 2023-02-16 — End: 2023-02-17

## 2023-02-16 MED ORDER — HYDROXYZINE HCL 25 MG PO TABS
25.0000 mg | ORAL_TABLET | Freq: Two times a day (BID) | ORAL | Status: DC | PRN
  Administered 2023-02-16 – 2023-02-21 (×8): 25 mg via ORAL
  Filled 2023-02-16 (×11): qty 1

## 2023-02-16 MED ORDER — MIDODRINE HCL 5 MG PO TABS
15.0000 mg | ORAL_TABLET | Freq: Three times a day (TID) | ORAL | Status: DC
  Administered 2023-02-16 – 2023-02-18 (×7): 15 mg via ORAL
  Filled 2023-02-16 (×7): qty 3

## 2023-02-16 MED ORDER — HYDROCORTISONE SOD SUC (PF) 100 MG IJ SOLR
100.0000 mg | Freq: Three times a day (TID) | INTRAMUSCULAR | Status: DC
  Administered 2023-02-16 – 2023-02-17 (×4): 100 mg via INTRAVENOUS
  Filled 2023-02-16 (×4): qty 2

## 2023-02-16 MED ORDER — PENTAFLUOROPROP-TETRAFLUOROETH EX AERO: 1.0000 | INHALATION_SPRAY | CUTANEOUS | Status: DC | PRN

## 2023-02-16 MED ORDER — LIDOCAINE-PRILOCAINE 2.5-2.5 % EX CREA: 1.0000 | TOPICAL_CREAM | CUTANEOUS | Status: DC | PRN

## 2023-02-16 MED ORDER — PENTAFLUOROPROP-TETRAFLUOROETH EX AERO
1.0000 | INHALATION_SPRAY | CUTANEOUS | Status: DC | PRN
Start: 2023-02-16 — End: 2023-02-17

## 2023-02-16 MED ORDER — LIDOCAINE-PRILOCAINE 2.5-2.5 % EX CREA
1.0000 | TOPICAL_CREAM | CUTANEOUS | Status: DC | PRN
Start: 2023-02-16 — End: 2023-02-17

## 2023-02-16 MED ORDER — ALTEPLASE 2 MG IJ SOLR: 2.0000 mg | Freq: Once | INTRAMUSCULAR | Status: DC | PRN

## 2023-02-16 MED ORDER — HEPARIN SODIUM (PORCINE) 1000 UNIT/ML DIALYSIS
1000.0000 [IU] | INTRAMUSCULAR | Status: DC | PRN
Start: 2023-02-16 — End: 2023-02-17

## 2023-02-16 NOTE — Progress Notes (Signed)
[facility]  ROUNDING NOTE   Subjective:   [name] was admitted to [facility] on 02/13/2023 for Shock circulatory [facility]) [R57.9] Hypotension, unspecified hypotension type [I95.9]  Patient is known to our practice and receives outpatient dialysis at [facility] on a TTS schedule, supervised by [facility] physicians.   Update: Patient sitting at side of bed States he feels well  Dialysis received yesterday, hypotension hindered fluid removal  Weaning Levo  Objective:  Vital signs in last 24 hours:  Temp:  [97.7 F (36.5 C)-98.8 F (37.1 C)] 97.7 F (36.5 C) (10/25 0922) Pulse Rate:  [35-125] 74 (10/25 0830) Resp:  [2-25] 15 (10/25 0830) BP: (73-129)/(37-74) 87/58 (10/25 0830) SpO2:  [86 %-100 %] 95 % (10/25 0830) Weight:  [121.1 kg] 121.1 kg (10/25 0500)  Weight change: 0.1 kg Filed Weights   02/15/23 0451 02/15/23 0900 02/16/23 0500  Weight: 121 kg 121.1 kg 121.1 kg    Intake/Output: I/O last 3 completed shifts: In: 770.3 [P.O.:240; I.V.:430.3; IV Piggyback:100] Out: 0    Intake/Output this shift:  Total I/O In: 8 [I.V.:8] Out: -   Physical Exam: General: No acute distress  Head: Normocephalic, atraumatic. Moist oral mucosal membranes  Lungs:  Clear to auscultation, normal effort  Heart: S1S2 no rubs  Abdomen:  Soft, nontender, bowel sounds present  Extremities: No peripheral edema.  Neurologic: Awake, alert, following commands  Skin: No acute rash, RLE wound  Access: Right IJ PermCath    Basic Metabolic Panel: Recent Labs  Lab 02/13/23 1624 02/14/23 0558 02/15/23 0328 02/16/23 0542  NA 134* 136 139 135  K 4.5 3.8 4.6 3.9  CL 94* 100 103 98  CO2 24 21* 20* 24  GLUCOSE 121* 146* 105* 146*  BUN 32* 43* 57* 47*  CREATININE 7.99* 9.19* 11.37* 10.02*  CALCIUM 9.1 8.0* 8.5* 7.8*  MG  --  2.0 2.3 1.9  PHOS  --  3.3 4.0 3.6    Liver Function Tests: Recent Labs  Lab 02/13/23 1624 02/15/23 0328  AST 27  --   ALT 30  --    ALKPHOS 82  --   BILITOT 0.7  --   PROT 8.7*  --   ALBUMIN 3.9 3.4*   No results for input(s): "LIPASE", "AMYLASE" in the last 168 hours. No results for input(s): "AMMONIA" in the last 168 hours.  CBC: Recent Labs  Lab 02/13/23 1624 02/14/23 0558 02/15/23 0328 02/16/23 0542  WBC 9.1 10.2 7.8 5.7  NEUTROABS 6.7  --   --   --   HGB 12.2* 11.2* 11.5* 10.9*  HCT 38.7* 34.1* 36.0* 33.3*  MCV 91.9 88.6 90.2 87.9  PLT 118* 157 148* 126*    Cardiac Enzymes: No results for input(s): "CKTOTAL", "CKMB", "CKMBINDEX", "TROPONINI" in the last 168 hours.  BNP: Invalid input(s): "POCBNP"  CBG: Recent Labs  Lab 02/14/23 0235  GLUCAP 93     Microbiology: Results for orders placed or performed during the hospital encounter of 02/13/23  MRSA Next Gen by PCR, Nasal     Status: None   Collection Time: 02/14/23  2:15 AM   Specimen: Nasal Mucosa; Nasal Swab  Result Value Ref Range Status   MRSA by PCR Next Gen NOT DETECTED NOT DETECTED Final    Comment: (NOTE) The GeneXpert MRSA Assay (FDA approved for NASAL specimens only), is one component of a comprehensive MRSA colonization surveillance program. It is not intended to diagnose MRSA infection nor to guide or monitor treatment for MRSA infections.  Test performance is not FDA approved in patients less than 61 years old. Performed at [facility], [address] [place], [place] [address]     Coagulation Studies: No results for input(s): "LABPROT", "INR" in the last 72 hours.   Urinalysis: No results for input(s): "COLORURINE", "LABSPEC", "PHURINE", "GLUCOSEU", "HGBUR", "BILIRUBINUR", "KETONESUR", "PROTEINUR", "UROBILINOGEN", "NITRITE", "LEUKOCYTESUR" in the last 72 hours.  Invalid input(s): "APPERANCEUR"    Imaging: ECHOCARDIOGRAM COMPLETE  Result Date: 02/14/2023    ECHOCARDIOGRAM REPORT   Patient Name:   Patrick Downs Date of Exam: 02/14/2023 Medical Rec #:  [id number]       Height:       74.0 in Accession  #:    [id number]      Weight:       262.8 lb Date of Birth:  Sep 27, 1975       BSA:          2.441 m Patient Age:    47 years        BP:           83/49 mmHg Patient Gender: M               HR:           74 bpm. Exam Location:  [facility] Procedure: 2D Echo, Cardiac Doppler and Color Doppler STAT ECHO Indications:     Shock R57.9  History:         Patient has prior history of Echocardiogram examinations, most                  recent 01/22/2018. CHF, Signs/Symptoms:Murmur; Risk                  Factors:Hypertension. ESRD.  Sonographer:     Cristela Blue Referring Phys:  [id number] KHABIB DGAYLI Diagnosing Phys: Julien Nordmann MD IMPRESSIONS  1. Left ventricular ejection fraction, by estimation, is 60 to 65%. The left ventricle has normal function. The left ventricle has no regional wall motion abnormalities. Left ventricular diastolic parameters are consistent with Grade I diastolic dysfunction (impaired relaxation).  2. Right ventricular systolic function is normal. The right ventricular size is normal. There is normal pulmonary artery systolic pressure. The estimated right ventricular systolic pressure is 15.9 mmHg.  3. The mitral valve is normal in structure. No evidence of mitral valve regurgitation. No evidence of mitral stenosis.  4. The aortic valve is tricuspid. Aortic valve regurgitation is not visualized. No aortic stenosis is present. FINDINGS  Left Ventricle: Left ventricular ejection fraction, by estimation, is 60 to 65%. The left ventricle has normal function. The left ventricle has no regional wall motion abnormalities. The left ventricular internal cavity size was normal in size. There is  no left ventricular hypertrophy. Left ventricular diastolic parameters are consistent with Grade I diastolic dysfunction (impaired relaxation). Right Ventricle: The right ventricular size is normal. No increase in right ventricular wall thickness. Right ventricular systolic function is normal. There is normal pulmonary artery  systolic pressure. The tricuspid regurgitant velocity is 1.65 m/s, and  with an assumed right atrial pressure of 5 mmHg, the estimated right ventricular systolic pressure is 15.9 mmHg. Left Atrium: Left atrial size was normal in size. Right Atrium: Right atrial size was normal in size. Pericardium: There is no evidence of pericardial effusion. Mitral Valve: The mitral valve is normal in structure. No evidence of mitral valve regurgitation. No evidence of mitral valve stenosis. MV peak gradient, 4.4 mmHg. The mean mitral valve gradient is 2.0  mmHg. Tricuspid Valve: The tricuspid valve is normal in structure. Tricuspid valve regurgitation is not demonstrated. No evidence of tricuspid stenosis. Aortic Valve: The aortic valve is tricuspid. Aortic valve regurgitation is not visualized. No aortic stenosis is present. Aortic valve mean gradient measures 5.0 mmHg. Aortic valve peak gradient measures 8.0 mmHg. Aortic valve area, by VTI measures 2.57 cm. Pulmonic Valve: The pulmonic valve was normal in structure. Pulmonic valve regurgitation is not visualized. No evidence of pulmonic stenosis. Aorta: The aortic root is normal in size and structure. Venous: The inferior vena cava was not well visualized. The inferior vena cava is normal in size with greater than 50% respiratory variability, suggesting right atrial pressure of 3 mmHg. IAS/Shunts: No atrial level shunt detected by color flow Doppler.  LEFT VENTRICLE PLAX 2D LVIDd:         4.40 cm   Diastology LVIDs:         3.20 cm   LV e' medial:    9.14 cm/s LV PW:         1.10 cm   LV E/e' medial:  6.7 LV IVS:        1.50 cm   LV e' lateral:   15.20 cm/s LVOT diam:     2.00 cm   LV E/e' lateral: 4.0 LV SV:         80 LV SV Index:   33 LVOT Area:     3.14 cm  RIGHT VENTRICLE RV Basal diam:  3.70 cm RV Mid diam:    3.30 cm LEFT ATRIUM             Index        RIGHT ATRIUM           Index LA diam:        4.00 cm 1.64 cm/m   RA Area:     13.20 cm LA Vol (A2C):   36.3 ml 14.87  ml/m  RA Volume:   31.70 ml  12.99 ml/m LA Vol (A4C):   31.8 ml 13.03 ml/m LA Biplane Vol: 33.8 ml 13.85 ml/m  AORTIC VALVE AV Area (Vmax):    2.21 cm AV Area (Vmean):   2.40 cm AV Area (VTI):     2.57 cm AV Vmax:           141.00 cm/s AV Vmean:          98.300 cm/s AV VTI:            0.311 m AV Peak Grad:      8.0 mmHg AV Mean Grad:      5.0 mmHg LVOT Vmax:         99.00 cm/s LVOT Vmean:        75.200 cm/s LVOT VTI:          0.254 m LVOT/AV VTI ratio: 0.82  AORTA Ao Root diam: 3.20 cm MITRAL VALVE               TRICUSPID VALVE MV Area (PHT): 2.22 cm    TR Peak grad:   10.9 mmHg MV Area VTI:   2.69 cm    TR Vmax:        165.00 cm/s MV Peak grad:  4.4 mmHg MV Mean grad:  2.0 mmHg    SHUNTS MV Vmax:       1.05 m/s    Systemic VTI:  0.25 m MV Vmean:      61.0 cm/s   Systemic Diam: 2.00 cm MV  Decel Time: 341 msec MV E velocity: 60.80 cm/s MV A velocity: 86.50 cm/s MV E/A ratio:  0.70 Julien Nordmann MD Electronically signed by Julien Nordmann MD Signature Date/Time: 02/14/2023/2:39:30 PM    Final      Medications:    cefTAZidime (FORTAZ)  IV Stopped (02/15/23 1402)   norepinephrine (LEVOPHED) Adult infusion 3 mcg/min (02/16/23 0800)    ascorbic acid  500 mg Oral BID   aspirin EC  81 mg Oral Daily   atorvastatin  40 mg Oral Daily   Chlorhexidine Gluconate Cloth  6 each Topical QHS   clopidogrel  75 mg Oral Daily   heparin  5,000 Units Subcutaneous Q8H   metroNIDAZOLE  500 mg Oral BID   midodrine  15 mg Oral TID WC   pregabalin  25 mg Oral Daily   zinc sulfate  220 mg Oral Daily   docusate sodium, heparin sodium (porcine), mouth rinse, oxyCODONE-acetaminophen, polyethylene glycol  Assessment/ Plan:  47 y.o. male ESRD on HD TTS, CHF, history of Guillain-Barr syndrome, hypertension, anemia of chronic kidney disease, secondary hyperparathyroidism, history of right foot toe amputation who presents with ulceration on lateral aspect of right foot.  [facility]  1.  ESRD  on HD TTS.  Dialysis received yesterday, no UF due to hypotension. Next treatment scheduled for Saturday.  2.  Anemia of chronic kidney disease.   Lab Results  Component Value Date   HGB 10.9 (L) 02/16/2023   Patient receives Mircera as outpatient. Hgb within desired range.   3.  Secondary hyperparathyroidism with outpatient labs: PTH 204, calcium 6.9, and phos 7.6 on 02/06/23 - Prescribed auryxia with meals outpatient  4.  Diabetes mellitus type II with chronic kidney disease and complication of right foot osteomyelitis:  - Continue ceftazidime 1g with HD treatments until 02/19/23  5. Hypotension, believed due to hypovolemia and sedation. Placed in ICU with Levophed drip and midodrine. Weaning Levo and will maintain Midodrine   LOS: 2 Kelsee Preslar 10/25/202410:15 AM

## 2023-02-16 NOTE — Progress Notes (Signed)
Date of Admission:  02/13/2023      ID: Patrick Downs is a 47 y.o. male  Principal Problem:   Shock circulatory (HCC) Active Problems:   ESRD (end stage renal disease) (HCC)    Subjective: Pt doing much better today Has more energy  Medications:   ascorbic acid  500 mg Oral BID   aspirin EC  81 mg Oral Daily   atorvastatin  40 mg Oral Daily   Chlorhexidine Gluconate Cloth  6 each Topical QHS   clopidogrel  75 mg Oral Daily   heparin  5,000 Units Subcutaneous Q8H   hydrocortisone sod succinate (SOLU-CORTEF) inj  100 mg Intravenous Q8H   metroNIDAZOLE  500 mg Oral BID   midodrine  15 mg Oral TID WC   pregabalin  25 mg Oral Daily   zinc sulfate  220 mg Oral Daily    Objective: Vital signs in last 24 hours: Patient Vitals for the past 24 hrs:  BP Temp Temp src Pulse Resp SpO2 Weight  02/16/23 1430 (!) 142/61 -- -- 76 16 98 % --  02/16/23 1400 (!) 104/46 -- -- 74 12 95 % --  02/16/23 1330 135/76 -- -- 83 18 96 % --  02/16/23 1300 (!) 111/54 -- -- 79 14 98 % --  02/16/23 1230 (!) 103/51 -- -- 75 14 95 % --  02/16/23 1215 (!) 107/45 -- -- 76 (!) 9 98 % --  02/16/23 1200 (!) 103/52 -- -- 77 14 97 % --  02/16/23 1145 (!) 90/48 -- -- 81 13 96 % --  02/16/23 1130 (!) 82/37 -- -- 88 14 96 % --  02/16/23 1115 (!) 100/50 -- -- 91 14 96 % --  02/16/23 1100 (!) 78/54 -- -- 91 13 94 % --  02/16/23 1045 94/74 -- -- 92 16 94 % --  02/16/23 1041 (!) 123/59 -- -- 93 16 98 % --  02/16/23 1030 -- -- -- (!) 103 (!) 23 100 % --  02/16/23 1015 -- -- -- 98 (!) 22 99 % --  02/16/23 1000 -- -- -- -- 19 -- --  02/16/23 0945 126/61 -- -- 76 16 100 % --  02/16/23 0930 (!) 116/46 -- -- 95 16 100 % --  02/16/23 0922 -- 97.7 F (36.5 C) Oral -- -- -- --  02/16/23 0915 (!) 117/58 -- -- 64 13 96 % --  02/16/23 0900 (!) 117/53 -- -- 61 13 95 % --  02/16/23 0845 (!) 110/57 -- -- (!) 56 13 98 % --  02/16/23 0830 (!) 87/58 -- -- 74 15 95 % --  02/16/23 0815 (!) 105/47 -- -- 73 13 94 % --   02/16/23 0800 (!) 113/58 -- -- 70 13 100 % --  02/16/23 0745 (!) 90/41 -- -- -- (!) 2 -- --  02/16/23 0730 (!) 88/54 -- -- -- 10 -- --  02/16/23 0715 (!) 97/42 -- -- -- 16 -- --  02/16/23 0700 101/60 -- -- -- (!) 25 -- --  02/16/23 0600 (!) 97/43 -- -- -- 18 -- --  02/16/23 0500 (!) 83/53 -- -- 77 15 95 % 121.1 kg  02/16/23 0400 (!) 114/41 -- Oral 87 18 96 % --  02/16/23 0300 (!) 111/49 -- -- 80 16 97 % --  02/16/23 0200 (!) 98/59 -- -- 84 (!) 24 95 % --  02/16/23 0100 (!) 116/45 -- -- 68 19 90 % --  02/16/23 0000 (!) 95/42 --  Oral -- 20 -- --  02/15/23 2300 (!) 112/54 -- -- -- 17 -- --  02/15/23 2200 (!) 109/50 -- -- -- 10 -- --  02/15/23 2131 (!) 101/57 -- -- -- 18 -- --  02/15/23 2116 (!) 97/48 -- -- -- 17 -- --  02/15/23 2100 (!) 94/51 -- -- -- 12 -- --  02/15/23 2000 (!) 123/57 98.1 F (36.7 C) Oral -- 13 -- --  02/15/23 1900 (!) 103/52 -- -- -- 13 -- --  02/15/23 1845 (!) 107/50 -- -- -- 13 -- --  02/15/23 1830 (!) 99/49 -- -- -- (!) 8 -- --  02/15/23 1815 (!) 95/51 -- -- -- 11 -- --  02/15/23 1800 (!) 81/58 -- -- -- 14 -- --  02/15/23 1745 (!) 78/46 -- -- -- 11 -- --  02/15/23 1730 (!) 73/46 -- -- -- 13 94 % --  02/15/23 1715 (!) 81/43 -- -- -- 16 -- --  02/15/23 1700 (!) 91/44 -- -- -- 15 -- --  02/15/23 1645 (!) 102/45 -- -- -- 17 -- --  02/15/23 1630 (!) 100/53 -- -- -- 19 -- --  02/15/23 1615 (!) 106/44 -- -- -- 17 -- --  02/15/23 1600 (!) 100/50 98.8 F (37.1 C) Oral -- 15 98 % --  02/15/23 1545 (!) 99/50 -- -- -- 16 -- --  02/15/23 1530 (!) 111/53 -- -- -- 20 -- --  02/15/23 1525 (!) 108/54 -- -- -- 15 -- --     LDA Foley Central lines Other catheters  PHYSICAL EXAM:  General: Alert, cooperative, no distress, appears stated age.   Extremities: rt foot wound vac   Lab Results    Latest Ref Rng & Units 02/16/2023    5:42 AM 02/15/2023    3:28 AM 02/14/2023    5:58 AM  CBC  WBC 4.0 - 10.5 K/uL 5.7  7.8  10.2   Hemoglobin 13.0 - 17.0 g/dL 40.9  81.1   91.4   Hematocrit 39.0 - 52.0 % 33.3  36.0  34.1   Platelets 150 - 400 K/uL 126  148  157        Latest Ref Rng & Units 02/16/2023    5:42 AM 02/15/2023    3:28 AM 02/14/2023    5:58 AM  CMP  Glucose 70 - 99 mg/dL 782  956  213   BUN 6 - 20 mg/dL 47  57  43   Creatinine 0.61 - 1.24 mg/dL 08.65  78.46  9.62   Sodium 135 - 145 mmol/L 135  139  136   Potassium 3.5 - 5.1 mmol/L 3.9  4.6  3.8   Chloride 98 - 111 mmol/L 98  103  100   CO2 22 - 32 mmol/L 24  20  21    Calcium 8.9 - 10.3 mg/dL 7.8  8.5  8.0       Microbiology:  Studies/Results: No results found.   Assessment/Plan: Hypotension-  Cortisol low- ?? 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   DM   H/o Guillian barre syndrome with neurological deficit Discussed the management with the patient and partner [facility] on  call this weekend- available for urgent issues by phone- call if needed.

## 2023-02-16 NOTE — Consult Note (Signed)
PHARMACY CONSULT NOTE - ELECTROLYTES  Pharmacy Consult for Electrolyte Monitoring and Replacement   Recent Labs: Height: 6\' 2"  (188 cm) Weight: 121.1 kg (266 lb 15.6 oz) IBW/kg (Calculated) : 82.2 Estimated Creatinine Clearance: 12.7 mL/min (A) (by C-G formula based on SCr of 10.02 mg/dL (H)).  Potassium (mmol/L)  Date Value  02/16/2023 3.9  08/22/2014 5.1   Magnesium (mg/dL)  Date Value  72/53/6644 1.9  08/22/2014 2.3   Calcium (mg/dL)  Date Value  03/47/4259 7.8 (L)   Calcium, Total (mg/dL)  Date Value  56/38/7564 7.1 (L)   Albumin (g/dL)  Date Value  33/29/5188 3.4 (L)  08/19/2014 2.1 (L)   Phosphorus (mg/dL)  Date Value  41/66/0630 3.6  08/22/2014 6.0 (H)   Sodium (mmol/L)  Date Value  02/16/2023 135  08/22/2014 136   Corrected Ca: 8.28 mg/dL  Assessment  Patrick Downs is a 47 y.o. male presenting with hypotension after dialysis session. PMH significant for HFpEF, ESRD, HTN, hyperparathyroidism, and Guillain-Barr syndrome. Pharmacy has been consulted to monitor and replace electrolytes.  Goal of Therapy: Electrolytes WNL  Plan:  Electrolyte replacement is not warranted at this time Check BMP, Mg, Phos with AM labs  Thank you for allowing pharmacy to be a part of this patient's care.  Littie Deeds, PharmD Pharmacy Resident  02/16/2023 7:10 AM

## 2023-02-16 NOTE — Plan of Care (Signed)
  Problem: Education: Goal: Knowledge of General Education information will improve Description: Including pain rating scale, medication(s)/side effects and non-pharmacologic comfort measures 02/16/2023 0436 by Tery Sanfilippo, RN Outcome: Progressing 02/16/2023 0435 by Albertine Patricia D, RN Outcome: Progressing   Problem: Clinical Measurements: Goal: Respiratory complications will improve Outcome: Progressing Goal: Cardiovascular complication will be avoided Outcome: Progressing   Problem: Nutrition: Goal: Adequate nutrition will be maintained Outcome: Progressing   Problem: Coping: Goal: Level of anxiety will decrease Outcome: Progressing   Problem: Pain Management: Goal: General experience of comfort will improve Outcome: Progressing   Problem: Safety: Goal: Ability to remain free from injury will improve Outcome: Progressing

## 2023-02-16 NOTE — Plan of Care (Signed)
  Problem: Education: Goal: Knowledge of General Education information will improve Description: Including pain rating scale, medication(s)/side effects and non-pharmacologic comfort measures Outcome: Progressing   Problem: Health Behavior/Discharge Planning: Goal: Ability to manage health-related needs will improve Outcome: Progressing   Problem: Clinical Measurements: Goal: Ability to maintain clinical measurements within normal limits will improve Outcome: Progressing Goal: Will remain free from infection Outcome: Progressing Goal: Diagnostic test results will improve Outcome: Progressing Goal: Cardiovascular complication will be avoided Outcome: Progressing   Problem: Activity: Goal: Risk for activity intolerance will decrease Outcome: Progressing   Problem: Coping: Goal: Level of anxiety will decrease Outcome: Progressing   Problem: Pain Management: Goal: General experience of comfort will improve Outcome: Progressing   Problem: Safety: Goal: Ability to remain free from injury will improve Outcome: Progressing   Problem: Skin Integrity: Goal: Risk for impaired skin integrity will decrease Outcome: Progressing

## 2023-02-16 NOTE — Progress Notes (Signed)
NAME:  Patrick Downs, MRN:  [id number], DOB:  28-Jan-1976, LOS: 2 ADMISSION DATE:  02/13/2023, CONSULTATION DATE:  02/17/23 REFERRING MD:  D. Dolores Frame, CHIEF COMPLAINT:  Hypotension  Brief Pt Description / Synopsis:  47 year old male with history of ESRD on hemodialysis presenting to the hospital with hypotension in the setting of hemodialysis requiring peripheral vasopressors.  Etiology of shock concerning for hypovolemia superimposed on Sildenafil along with adrenal insufficiency.  History of Present Illness:  47 yo M presenting to [facility] ED from [facility] for evaluation of hypotension after dialysis session.  History provided per chart review and patient bedside report. Patient was in his normal state of health attending his regular scheduled hemodialysis session.  Patient reports that during dialysis his blood pressure was reading on the lower side, however it has the patient was asymptomatic nursing continued with dialysis pulling 3.5 L outpatient.  He was then transported to [facility], where he has been since his most recent discharge on 01/31/2023.  Upon arrival to [facility] he was administered Percocet per his chronic pain outpatient regimen.  EMS was then called due to SBP in the 50's. Upon arrival to ED patient reported complaints of dizziness, however on bedside interview patient denied any recent complaints.  He denied fever/chills, nausea/vomiting/diarrhea/abdominal pain, denied chest pain/dyspnea, denied increased redness or swelling in extremities, denied blurred vision/headache/dizziness/lightheadedness/falls, weakness/numbness/paresthesias.  The patient reports his only complaint is chronic intermittent back pain that has been worsening progressively since 2016. 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  Outpatient visits with PCP describe somewhat labile hemodynamics where the patient has required clonidine and midodrine from time to time, however  hemodynamics which were stable with maps in the 70s outpatient. Of note patient was at rehab for assistance due to osteomyelitis and right foot requiring debridement, wound VAC and no weightbearing.  ED course: Upon arrival alert and oriented, hypotensive with maps in the 40s.  He received 2 L IV fluid resuscitation due to concerns for hypovolemia postdialysis.  He also received Narcan due to concerns that this could be because of Percocet administration.  Ultimately patient was started on Levophed drip.  Due to asymptomatic nature of hypotension PCCM consulted for collaboration, lactic acid checked which was WNL.  Other labs unremarkable: No leukocytosis, mild thrombocytopenia with renal function at baseline and no electrolyte derangements.  Attempts to wean down Levophed drip unsuccessful, PCCM consulted for admission. Medications given: Narcan, 2 L IV fluid bolus Initial Vitals: 97.8, 14, 82, 61/34 with a MAP of 43 and 96% on room air Significant labs: (Labs/ Imaging personally reviewed) I, Cheryll Cockayne Rust-Chester, AGACNP-BC, personally viewed and interpreted this ECG. EKG Interpretation: Date: 02/13/2023, EKG Time: 16:11, Rate: 87, Rhythm: NSR, QRS Axis: Normal, Intervals: Normal, ST/T Wave abnormalities: None, Narrative Interpretation: NSR Chemistry: Na+: 134, K+: 4.5, BUN/Cr.:  32/7.99, Serum CO2/ AG: 24/16, Cl: 94 Hematology: WBC: 9.1, Hgb: 12.2, plt: 118 Troponin: 10 > 12, Lactic: 1.9> 1.7  PCCM consulted for admission due to circulatory shock due to unknown etiology in the setting of recent hemodialysis and end-stage renal disease.  Please see "Significant Hospital Events" section below for full detailed hospital course.  Pertinent  Medical History  HFpEF ESRD (TThSa) Gilliam Barr syndrome Hypertension DDD Hyperparathyroidism  Micro Data:  10/23: MRSA PCR>>negative  Antimicrobials:   Anti-infectives (From admission, onward)    Start     Dose/Rate Route Frequency Ordered  Stop   02/17/23 0000  cefTAZidime (FORTAZ) IVPB  Status:  Discontinued       Placed in "And" Linked Group   2 g Intravenous Once in dialysis 02/14/23 0250 02/14/23 0258   02/15/23 1200  cefTAZidime (FORTAZ) 1 g in sodium chloride 0.9 % 100 mL IVPB        1 g 200 mL/hr over 30 Minutes Intravenous Every T-Th-Sa (Hemodialysis) 02/14/23 0259     02/14/23 1000  metroNIDAZOLE (FLAGYL) tablet 500 mg        500 mg Oral 2 times daily 02/14/23 0235     02/14/23 0345  cefTAZidime (FORTAZ) IVPB  Status:  Discontinued       Placed in "And" Linked Group   1 g Intravenous Once in dialysis 02/14/23 0250 02/14/23 0258       Significant Hospital Events: Including procedures, antibiotic start and stop dates in addition to other pertinent events   02/14/23: Admit to ICU with circulatory shock requiring peripheral vasopressor support due to unknown etiology in the setting of recent hemodialysis and ESRD. 02/15/23: Weaning down Levophed (down to 3 mcg).  Plan for HD today.  Consult ID for right foot osteomyelitis. 02/16/23:  Remains on low dose Levophed (3 mcg), in process of weaning down, Midodrine increased to 15 mg TID.  Found to have adrenal insufficiency with cortisol of 4.8, start Solu-Cortef.  Interim History / Subjective:  -Received HD yesterday, required up titration of Levophed during HD -No significant events noted overnight -Afebrile, remains on low dose Levophed but weaning down (currently down to 3 mcg) ~lactic is normal, pt is asymptomatic ~ continue weaning for a goal SBP >90 -Increase Midodrine to 15 mg TID -AM cortisol resulted at 4.8 ~ start stress dose steroids -Leukocytosis has resolved    Objective   Blood pressure (!) 103/52, pulse 77, temperature 97.7 F (36.5 C), temperature source Oral, resp. rate 14, height 6\' 2"  (1.88 m), weight 121.1 kg, SpO2 97%.        Intake/Output Summary (Last 24 hours) at 02/16/2023 1211 Last data filed at 02/16/2023 0800 Gross per 24 hour   Intake 339.26 ml  Output 0 ml  Net 339.26 ml   Filed Weights   02/15/23 0451 02/15/23 0900 02/16/23 0500  Weight: 121 kg 121.1 kg 121.1 kg    Examination: General: Adult male, acutely ill, sitting in bed, on room air, in NAD HEENT: MM pink/moist, anicteric, atraumatic, neck supple Neuro: A&O x 4, able to follow commands, PERRL +3, MAE CV: s1s2 RRR, NSR on monitor, no r/m/g Pulm: Regular, non labored on room air, breath sounds clear throughout GI: soft, rounded, non tender, bs x 4 GU: Anuric Skin: Wrapped RLE where wound VAC was in place Extremities: warm/dry, pulses + 2 R/P, no edema noted  Resolved Hospital Problem list     Assessment & Plan:   #Shock: suspect secondary to possible hypovolemia in the setting recent hemodialysis +/- related to Sildenafil along with Adrenal Insufficiency #Hyperlipidemia Patient with documented history of chronic hypotension alternating with hypertension requiring midodrine outpatient, however not currently on midodrine and recent outpatient blood pressures have been stable. Echocardiogram 02/14/23: LVEF 60-65%, grade I DD, RV systolic function normal -Continuous cardiac monitoring -Maintain SBP >90 -Cautious IV fluids -Vasopressors as needed to maintain MAP goal -Increase Midodrine to 15 mg TID -Lactic acid is normalized -HS Troponin negative x2 -Continue Lipitor -Noted to have a prescription for sildenafil 100 mg once daily for erectile dysfunction ~ HOLD -Start Stress dose steroids  #ESRD on iHD (TThSa) -Monitor I&O's / urinary output -Follow BMP -  Ensure adequate renal perfusion -Avoid nephrotoxic agents as able -Replace electrolytes as indicated ~ Pharmacy following for assistance with electrolyte replacement -Nephrology following, appreciate input ~ HD as per Nephrology  #Osteomyelitis of the right foot status post debridement Patient reports being discharged with wound VAC machine but is currently at [facility]. 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  Labs   CBC: Recent Labs  Lab 02/13/23 1624 02/14/23 0558 02/15/23 0328 02/16/23 0542  WBC 9.1 10.2 7.8 5.7  NEUTROABS 6.7  --   --   --   HGB 12.2* 11.2* 11.5* 10.9*  HCT 38.7* 34.1* 36.0* 33.3*  MCV 91.9 88.6 90.2 87.9  PLT 118* 157 148* 126*    Basic Metabolic Panel: Recent Labs  Lab 02/13/23 1624 02/14/23 0558 02/15/23 0328 02/16/23 0542  NA 134* 136 139 135  K 4.5 3.8 4.6 3.9  CL 94* 100 103 98  CO2 24 21* 20* 24  GLUCOSE 121* 146* 105* 146*  BUN 32* 43* 57* 47*  CREATININE 7.99* 9.19* 11.37* 10.02*  CALCIUM 9.1 8.0* 8.5* 7.8*  MG  --  2.0 2.3 1.9  PHOS  --  3.3 4.0 3.6   GFR: Estimated Creatinine Clearance: 12.7 mL/min (A) (by C-G formula based on SCr of 10.02 mg/dL (H)). Recent Labs  Lab 02/13/23 1624 02/13/23 2232 02/13/23 2347 02/14/23 0558 02/14/23 1058 02/14/23 1331 02/15/23 0328 02/16/23 0542  PROCALCITON  --   --   --  3.38  --   --   --   --   WBC 9.1  --   --  10.2  --   --  7.8 5.7  LATICACIDVEN  --    < > 1.7  --  1.8 1.4  --  1.3   < > = values in this interval not displayed.    Liver Function Tests: Recent Labs  Lab 02/13/23 1624 02/15/23 0328  AST 27  --   ALT 30  --    ALKPHOS 82  --   BILITOT 0.7  --   PROT 8.7*  --   ALBUMIN 3.9 3.4*   No results for input(s): "LIPASE", "AMYLASE" in the last 168 hours. No results for input(s): "AMMONIA" in the last 168 hours.  ABG    Component Value Date/Time   PHART 7.408 09/12/2014 1305   PCO2ART 48.0 (H) 09/12/2014 1305   PO2ART 41.2 (L) 09/12/2014 1305   HCO3 29.0 (H) 07/31/2022 2000   TCO2 24 04/08/2020 1129   ACIDBASEDEF 7.6 (H) 07/31/2022 1530   O2SAT 59.7 07/31/2022 2000     Coagulation Profile: No results for input(s): "INR", "PROTIME" in the last 168 hours.  Cardiac Enzymes: No results for input(s): "CKTOTAL", "CKMB", "CKMBINDEX", "TROPONINI" in the last 168 hours.  HbA1C: Hgb A1c MFr Bld  Date/Time Value Ref Range Status  01/21/2023 02:35 PM  6.0 (H) 4.8 - 5.6 % Final    Comment:    (NOTE) Pre diabetes:          5.7%-6.4%  Diabetes:              >6.4%  Glycemic control for   <7.0% adults with diabetes   05/14/2020 08:24 PM 5.4 4.8 - 5.6 % Final    Comment:    (NOTE) Pre diabetes:          5.7%-6.4%  Diabetes:              >6.4%  Glycemic control for   <7.0% adults with diabetes     CBG: Recent Labs  Lab 02/14/23 0235  GLUCAP 93    Review of Systems: Positives in BOLD  Gen: Denies fever, chills, weight change, fatigue, night sweats, intermittent back pain, currently denies all complaints HEENT: Denies blurred vision, double vision, hearing loss, tinnitus, sinus congestion, rhinorrhea, sore throat, neck stiffness, dysphagia PULM: Denies shortness of breath, cough, sputum production, hemoptysis, wheezing CV: Denies chest pain, edema, orthopnea, paroxysmal nocturnal dyspnea, palpitations GI: Denies abdominal pain, nausea, vomiting, diarrhea, hematochezia, melena, constipation, change in bowel habits GU: Denies dysuria, hematuria, polyuria, oliguria, urethral discharge Endocrine: Denies hot or cold intolerance, polyuria, polyphagia or appetite change Derm: Denies rash, dry  skin, scaling or peeling skin change Heme: Denies easy bruising, bleeding, bleeding gums Neuro: Denies headache, numbness, weakness, slurred speech, loss of memory or consciousness  Past Medical History:  He,  has a past medical history of CHF (congestive heart failure) (HCC), Degenerative disc disease, lumbar, Depression, Dialysis patient (HCC), ESRD (end stage renal disease) on dialysis (HCC), Failure to thrive in adult, GERD (gastroesophageal reflux disease), Gout, Guillain Barr syndrome (HCC), Guillain Barr syndrome (HCC), Heart murmur, Hip pain, chronic, left (12/24/2020), HTN (hypertension), Hyperparathyroidism (HCC), Kidney failure, MRSA (methicillin resistant staph aureus) culture positive, Peripheral neuropathy, Pneumonia, Renal insufficiency, and Respiratory failure (HCC).   Surgical History:   Past Surgical History:  Procedure Laterality Date   A/V FISTULAGRAM Right 01/07/2018   Procedure: A/V FISTULAGRAM;  Surgeon: Annice Needy, MD;  Location: [facility];  Service: Cardiovascular;  Laterality: Right;   AMPUTATION TOE Right 06/08/2017   Procedure: AMPUTATION TOE RIGHT FIFTH TOE;  Surgeon: Gwyneth Revels, DPM;  Location: [facility] OR[facility];  Service: Podiatry;  Laterality: Right;   AMPUTATION TOE Left 05/17/2018   Procedure: RAY LEFT 5TH;  Surgeon: Gwyneth Revels, DPM;  Location: [facility] OR[facility];  Service: Podiatry;  Laterality: Left;   AMPUTATION TOE Right 03/05/2020   Procedure: AMPUTATION TOE IPJ X 2 RIGHT GREAT AND 3RD;  Surgeon: Gwyneth Revels, DPM;  Location: [facility];  Service: Podiatry;  Laterality: Right;   AV FISTULA PLACEMENT     x5      2 graphs   AV FISTULA PLACEMENT Right 04/08/2020   Procedure: INSERTION OF ARTERIOVENOUS (AV) GORE-TEX GRAFT ARM ( ARTEGRAFT);  Surgeon: Annice Needy, MD;  Location: [facility] OR[facility];  Service: Vascular;  Laterality: Right;   DIALYSIS/PERMA CATHETER INSERTION N/A 01/20/2019   Procedure: DIALYSIS/PERMA CATHETER INSERTION;  Surgeon: Annice Needy, MD;   Location: [facility];  Service: Cardiovascular;  Laterality: N/A;   DIALYSIS/PERMA CATHETER REMOVAL N/A 06/17/2018   Procedure: DIALYSIS/PERMA CATHETER REMOVAL;  Surgeon: Annice Needy, MD;  Location: [facility];  Service: Cardiovascular;  Laterality: N/A;   IRRIGATION AND DEBRIDEMENT FOOT Right 01/23/2023   Procedure: IRRIGATION AND DEBRIDEMENT FOOT;  Surgeon: Gwyneth Revels, DPM;  Location: [facility]  ORS;  Service: Orthopedics/Podiatry;  Laterality: Right;   LOWER EXTREMITY ANGIOGRAPHY Right 01/24/2023   Procedure: Lower Extremity Angiography;  Surgeon: Annice Needy, MD;  Location: [facility];  Service: Cardiovascular;  Laterality: Right;   PARATHYROIDECTOMY     PERIPHERAL VASCULAR THROMBECTOMY Right 01/22/2019   Procedure: PERIPHERAL VASCULAR THROMBECTOMY;  Surgeon: Annice Needy, MD;  Location: [facility];  Service: Cardiovascular;  Laterality: Right;   REMOVAL OF GRAFT Right 05/16/2020   Procedure: REMOVAL OF GRAFT;  Surgeon: Fransisco Hertz, MD;  Location: [facility];  Service: Vascular;  Laterality: Right;   RENAL BIOPSY     REVISON OF ARTERIOVENOUS FISTULA Right 02/07/2018   Procedure: REVISON OF ARTERIOVENOUS FISTULA;  Surgeon: Annice Needy, MD;  Location: [facility];  Service: Vascular;  Laterality: Right;   TEE WITHOUT CARDIOVERSION N/A 01/22/2018   Procedure: TRANSESOPHAGEAL ECHOCARDIOGRAM (TEE);  Surgeon: Laurier Nancy, MD;  Location: [facility];  Service: Cardiovascular;  Laterality: N/A;   tonsiilectomy     TONSILLECTOMY     tracheotomy     UPPER EXTREMITY VENOGRAPHY Bilateral 02/23/2020   Procedure: UPPER EXTREMITY VENOGRAPHY;  Surgeon: Annice Needy, MD;  Location: [facility];  Service: Cardiovascular;  Laterality: Bilateral;     Social History:   reports that he has never smoked. He has never used smokeless tobacco. He reports current drug use. Drug: Marijuana. He reports that he does not drink alcohol.   Family History:  His family history  includes Diabetes Mellitus II in his father; Kidney disease in his father; Kidney failure in his paternal grandfather. There is no history of Prostate cancer, Kidney cancer, or Bladder Cancer.   Allergies Allergies  Allergen Reactions   Hepatitis B Vaccine     Other reaction(s): Unknown   Ondansetron Other (See Comments)    Stomach pain    Minoxidil Other (See Comments)    "put fluid around my heart", PERICARDIAL EFFUSION   Morphine And Codeine Other (See Comments)    Aggressive    Omnipaque [Iohexol] Itching and Other (See Comments)    Rigors on one occasion, widespread itching on a separate occasion (resolved with Benadryl), tremors     Home Medications  Prior to Admission medications   Medication Sig Start Date End Date Taking? Authorizing Provider  acetaminophen (TYLENOL) 500 MG tablet Take 1,000-1,500 mg by mouth 2 (two) times daily as needed for moderate pain or headache.   Yes [provider]  ascorbic acid (VITAMIN C) 500 MG tablet Take 1 tablet (500 mg total) by mouth 2 (two) times daily. 01/29/23  Yes Pennie Banter, DO  aspirin EC 81 MG tablet Take 1 tablet (81 mg total) by mouth daily. Swallow whole. 01/29/23 01/29/24 Yes Esaw Grandchild A, DO  atorvastatin (LIPITOR) 40 MG tablet Take 1 tablet (40 mg total) by mouth daily. 01/29/23 01/29/24 Yes Pennie Banter, DO  cefTAZidime (FORTAZ) IVPB Inject 1 g into the vein every Tuesday AND 1 g every Thursday AND 2 g every Saturday. Do all this for 20 days. Indication:  Osteomyelitis First Dose: Yes Last Day of Therapy:  02/19/23 Labs - Once weekly:  CBC/D and CMP, Labs - Once weekly: ESR and CRP Method of administration: IV Push Method of administration may be changed at the discretion of home infusion pharmacist based upon assessment of the patient and/or caregiver's ability to self-administer the medication ordered.. 01/30/23 02/19/23 Yes Esaw Grandchild A, DO  clopidogrel (PLAVIX) 75 MG tablet Take 1  tablet (75 mg  total) by mouth daily. 01/29/23 01/29/24 Yes Esaw Grandchild A, DO  hydrOXYzine (ATARAX) 25 MG tablet Take 1 tablet by mouth 2 (two) times daily. 12/02/20  Yes [provider]  metroNIDAZOLE (FLAGYL) 500 MG tablet Take 1 tablet (500 mg total) by mouth 2 (two) times daily for 21 days. 01/29/23 02/19/23 Yes Esaw Grandchild A, DO  multivitamin (RENA-VIT) TABS tablet Take 1 tablet by mouth at bedtime. 01/29/23  Yes Pennie Banter, DO  oxyCODONE-acetaminophen (PERCOCET) 10-325 MG tablet Take 1 tablet by mouth every 4 (four) hours as needed for pain. 01/22/23 02/21/23 Yes Yevette Edwards, MD  pregabalin (LYRICA) 25 MG capsule Take by mouth.   Yes [provider]  sildenafil (VIAGRA) 100 MG tablet Take 100 mg by mouth daily as needed for erectile dysfunction. 08/20/21  Yes [provider]  zinc sulfate 220 (50 Zn) MG capsule Take 1 capsule (220 mg total) by mouth daily. 01/29/23  Yes Pennie Banter, DO  nutrition supplement, JUVEN, (JUVEN) PACK Take 1 packet by mouth 2 (two) times daily between meals. 01/29/23   Pennie Banter, DO  Nutritional Supplements (,FEEDING SUPPLEMENT, PROSOURCE PLUS) liquid Take 30 mLs by mouth 3 (three) times daily between meals. 01/29/23   Pennie Banter, DO     Critical care time: 40 minutes    Harlon Ditty, AGACNP-BC [facility] Prefer epic messenger for cross cover needs If after hours, please call E-link

## 2023-02-17 DIAGNOSIS — M869 Osteomyelitis, unspecified: Secondary | ICD-10-CM | POA: Diagnosis not present

## 2023-02-17 DIAGNOSIS — I739 Peripheral vascular disease, unspecified: Secondary | ICD-10-CM

## 2023-02-17 DIAGNOSIS — N186 End stage renal disease: Secondary | ICD-10-CM

## 2023-02-17 DIAGNOSIS — Z7409 Other reduced mobility: Secondary | ICD-10-CM

## 2023-02-17 DIAGNOSIS — R579 Shock, unspecified: Secondary | ICD-10-CM | POA: Diagnosis not present

## 2023-02-17 LAB — RENAL FUNCTION PANEL
Albumin: 3.8 g/dL (ref 3.5–5.0)
Albumin: 3.3 g/dL — ABNORMAL LOW (ref 3.5–5.0)
Anion gap: 17 — ABNORMAL HIGH (ref 5–15)
Anion gap: 17 — ABNORMAL HIGH (ref 5–15)
BUN: 64 mg/dL — ABNORMAL HIGH (ref 6–20)
BUN: 71 mg/dL — ABNORMAL HIGH (ref 6–20)
CO2: 19 mmol/L — ABNORMAL LOW (ref 22–32)
CO2: 21 mmol/L — ABNORMAL LOW (ref 22–32)
Calcium: 8 mg/dL — ABNORMAL LOW (ref 8.9–10.3)
Calcium: 7.6 mg/dL — ABNORMAL LOW (ref 8.9–10.3)
Chloride: 101 mmol/L (ref 98–111)
Chloride: 99 mmol/L (ref 98–111)
Creatinine, Ser: 12.29 mg/dL — ABNORMAL HIGH (ref 0.61–1.24)
Creatinine, Ser: 13.75 mg/dL — ABNORMAL HIGH (ref 0.61–1.24)
GFR, Estimated: 5 mL/min — ABNORMAL LOW (ref >60)
GFR, Estimated: 4 mL/min — ABNORMAL LOW (ref >60)
Glucose, Bld: 120 mg/dL — ABNORMAL HIGH (ref 70–99)
Glucose, Bld: 165 mg/dL — ABNORMAL HIGH (ref 70–99)
Phosphorus: 2.6 mg/dL (ref 2.5–4.6)
Phosphorus: 2.1 mg/dL — ABNORMAL LOW (ref 2.5–4.6)
Potassium: 6.4 mmol/L (ref 3.5–5.1)
Potassium: 4.7 mmol/L (ref 3.5–5.1)
Sodium: 137 mmol/L (ref 135–145)
Sodium: 137 mmol/L (ref 135–145)

## 2023-02-17 LAB — CBC
HCT: 39.4 % (ref 39.0–52.0)
Hemoglobin: 12.6 g/dL — ABNORMAL LOW (ref 13.0–17.0)
MCH: 28.9 pg (ref 26.0–34.0)
MCHC: 32 g/dL (ref 30.0–36.0)
MCV: 90.4 fL (ref 80.0–100.0)
Platelets: 146 10*3/uL — ABNORMAL LOW (ref 150–400)
RBC: 4.36 MIL/uL (ref 4.22–5.81)
RDW: 14.5 % (ref 11.5–15.5)
WBC: 7.9 10*3/uL (ref 4.0–10.5)
nRBC: 0 % (ref 0.0–0.2)

## 2023-02-17 LAB — MAGNESIUM: Magnesium: 2.1 mg/dL (ref 1.7–2.4)

## 2023-02-17 LAB — HEPATITIS B SURFACE ANTIGEN: Hepatitis B Surface Ag: NONREACTIVE

## 2023-02-17 MED ORDER — PROSOURCE PLUS PO LIQD
30.0000 mL | Freq: Three times a day (TID) | ORAL | Status: DC
  Administered 2023-02-17 – 2023-02-22 (×11): 30 mL via ORAL
  Filled 2023-02-17 (×17): qty 30

## 2023-02-17 MED ORDER — HEPARIN SODIUM (PORCINE) 1000 UNIT/ML IJ SOLN
INTRAMUSCULAR | Status: AC
  Filled 2023-02-17: qty 10

## 2023-02-17 MED ORDER — JUVEN PO PACK
1.0000 | PACK | Freq: Two times a day (BID) | ORAL | Status: DC
  Administered 2023-02-17 – 2023-02-22 (×8): 1 via ORAL

## 2023-02-17 MED ORDER — RENA-VITE PO TABS
1.0000 | ORAL_TABLET | Freq: Every day | ORAL | Status: DC
  Administered 2023-02-17 – 2023-02-21 (×5): 1 via ORAL
  Filled 2023-02-17 (×5): qty 1

## 2023-02-17 MED ORDER — HYDROCORTISONE SOD SUC (PF) 100 MG IJ SOLR
100.0000 mg | Freq: Two times a day (BID) | INTRAMUSCULAR | Status: DC
  Administered 2023-02-18 (×2): 100 mg via INTRAVENOUS
  Filled 2023-02-17 (×2): qty 2

## 2023-02-17 NOTE — Assessment & Plan Note (Signed)
S/p debridement and wound VAC in place Patient was recently discharged to [facility] with wound VAC and to continue ceftazidime with dialysis and twice daily Flagyl until 10/28. -ID is on board and will decide after seeing wound during wound VAC change on Monday whether to continue antibiotics.

## 2023-02-17 NOTE — Progress Notes (Signed)
Progress Note   Patient: Patrick Downs ZOX:[id number] DOB: [birth year] DOA: 02/13/2023     3 DOS: the patient was seen and examined on 02/17/2023   Brief hospital course: ICU transfer for 02/17/2023.  Taken from prior notes.  47 year old male with history of ESRD on hemodialysis presenting to the hospital with hypotension in the setting of hemodialysis requiring peripheral vasopressors.  Etiology of shock concerning for hypovolemia superimposed on Sildenafil along with adrenal insufficiency.   Patient was at rehab for assistance due to osteomyelitis and right foot requiring debridement, wound VAC and no weightbearing.  He is on ceftazidime with dialysis.  When EMS was called patient has systolic blood pressure in 50s.  Initially admitted in ICU as requiring IV pressors.  Labs and lactic acid was unremarkable.  Renal function at baseline.  Patient was started on stress doses of steroid, later weaned off from Levophed and midodrine dose was increased to 15 mg 3 times daily.  Patient also has an history of chronic hypertension alternating with hypotension requiring midodrine in the past but recently blood pressure was stable off the midodrine.  Echocardiogram done on 02/14/2023 with normal EF, grade 1 diastolic dysfunction and no other significant abnormality.  10/26: Patient with some intermittent mild hypotension during dialysis today, no symptoms.  Remained on high dose of Solu-Cortef with 100 mg Q8 hourly-started tapering. Remained on ceftazidime with dialysis for wound infection. PT/OT evaluation ordered.  As patient was recently discharged to SNF for rehab.  Assessment and Plan: * Shock circulatory [facility]) Patient presented with significant hypotension requiring pressors.  Likely multifactorial due to dialysis, using sildenafil and there was a concern of adrenal insufficiency. Now maintaining blood pressure although intermittently becoming soft on midodrine. He was also placed on  stress doses of steroid -Continue midodrine at 15 mg 3 times daily. -Decreasing the dose of Solu-Cortef to every 12 hourly today-will start tapering now. -Monitor blood pressure -Encourage p.o. hydration  Osteomyelitis of right foot (HCC) S/p debridement and wound VAC in place Patient was recently discharged to [facility] with wound VAC and to continue ceftazidime with dialysis and twice daily Flagyl until 10/28. -ID is on board and will decide after seeing wound during wound VAC change on Monday whether to continue antibiotics.   Hypotension Still some intermittent softer blood pressure but he remains asymptomatic. -Continue with midodrine -Started tapering Solu-Cortef  Peripheral neuropathy Chronic pain. History of Guillain-Barr. -Continue home Lyrica and Percocet  ESRD (end stage renal disease) (HCC) Getting dialysis today. -Nephrology is on board-will continue routine dialysis  PVD (peripheral vascular disease) (HCC) S/p PCI by vascular surgery on 01/24/2023. -Continue with aspirin and Plavix -Continue with statin  Impaired mobility Patient was recently discharged to [facility] for rehab. -PT and OT evaluation   Subjective: Patient was seen during dialysis.  Denies any dizziness or weakness at this time.  Still having difficulty with ambulation.  Physical Exam: Vitals:   02/17/23 1220 02/17/23 1233 02/17/23 1250 02/17/23 1319  BP: (!) 79/70 (!) 95/52  (!) 110/34  Pulse: 86 90  86  Resp: 11 (!) 21  15  Temp:  98.4 F (36.9 C)  98.7 F (37.1 C)  TempSrc:    Oral  SpO2: 97% 92%  94%  Weight:   118.7 kg   Height:       General.  Obese gentleman, in no acute distress. Pulmonary.  Lungs clear bilaterally, normal respiratory effort. CV.  Regular rate and rhythm, no JVD, rub or murmur.  Abdomen.  Soft, nontender, nondistended, BS positive. CNS.  Alert and oriented .  No focal neurologic deficit. Extremities.  No edema, right foot with wound VAC and  bandaged,  Psychiatry.  Judgment and insight appears normal.   Data Reviewed: Prior data reviewed  Family Communication: Discussed with patient  Disposition: Status is: Inpatient Remains inpatient appropriate because: Severity of illness  Planned Discharge Destination:  To be determined  DVT prophylaxis.  Subcu heparin Time spent: 50 minutes  This record has been created using Conservation officer, historic buildings. Errors have been sought and corrected,but may not always be located. Such creation errors do not reflect on the standard of care.   Author: Arnetha Courser, MD 02/17/2023 2:30 PM  For on call review [email or web address].

## 2023-02-17 NOTE — Assessment & Plan Note (Signed)
S/p PCI by vascular surgery on 01/24/2023. -Continue with aspirin and Plavix -Continue with statin

## 2023-02-17 NOTE — Assessment & Plan Note (Signed)
Patient presented with significant hypotension requiring pressors.  Likely multifactorial due to dialysis, using sildenafil and there was a concern of adrenal insufficiency. Now maintaining blood pressure although intermittently becoming soft on midodrine. He was also placed on stress doses of steroid -Continue midodrine at 15 mg 3 times daily. -Decreasing the dose of Solu-Cortef to every 12 hourly today-will start tapering now. -Monitor blood pressure -Encourage p.o. hydration

## 2023-02-17 NOTE — Hospital Course (Addendum)
ICU transfer for 02/17/2023.  Taken from prior notes.  47 year old male with history of ESRD on hemodialysis presenting to the hospital with hypotension in the setting of hemodialysis requiring peripheral vasopressors.  Etiology of shock concerning for hypovolemia superimposed on Sildenafil along with adrenal insufficiency.   Patient was at rehab for assistance due to osteomyelitis and right foot requiring debridement, wound VAC and no weightbearing.  He is on ceftazidime with dialysis.  When EMS was called patient has systolic blood pressure in 50s.  Initially admitted in ICU as requiring IV pressors.  Labs and lactic acid was unremarkable.  Renal function at baseline.  Patient was started on stress doses of steroid, later weaned off from Levophed and midodrine dose was increased to 15 mg 3 times daily.  Patient also has an history of chronic hypertension alternating with hypotension requiring midodrine in the past but recently blood pressure was stable off the midodrine.  Echocardiogram done on 02/14/2023 with normal EF, grade 1 diastolic dysfunction and no other significant abnormality.  10/26: Patient with some intermittent mild hypotension during dialysis today, no symptoms.  Remained on high dose of Solu-Cortef with 100 mg Q8 hourly-started tapering. Remained on ceftazidime with dialysis for wound infection. PT/OT evaluation ordered.  As patient was recently discharged to SNF for rehab.  10/27: Hemodynamically stable, PT is recommending CIR but unfortunately insurance will not pay and patient will go back to his prior skilled nursing facility for further rehab. Blood pressure started trending up, decreasing midodrine to 5 mg with holding parameters. Will start on prednisone from tomorrow.  10/28: Hemodynamically stable with mildly elevated blood pressure not requiring midodrine currently.  Pending repeat insurance authorization to go back to [facility].  ID is recommending continuation of 2  more weeks of ceftazidime with dialysis.  10/29: Remained stable.  Had his routine dialysis today.  Awaiting insurance authorization for rehab.  10/30: Patient remained hemodynamically stable.  Insurance denied him going back to rehab so he is being discharged home with home health services.  He will continue with wound VAC and change on Monday and Thursdays.  He need to follow-up with vascular surgery for further recommendations.  Patient was also instructed to check his blood pressure 2-3 times daily and take midodrine if systolic is below 120.  He can continue taking on the day of dialysis if having lower blood pressure with dialysis.  Patient has labile blood pressure with quite significant variation.  Patient will also continue ceftazidime for 2 more weeks which will be administered during dialysis.  He will continue the rest of his home medications.  Completed the course of steroid.  Patient need to follow-up with his providers for further management.

## 2023-02-17 NOTE — Consult Note (Signed)
PHARMACY CONSULT NOTE - ELECTROLYTES  Pharmacy Consult for Electrolyte Monitoring and Replacement   Recent Labs: Height: 6\' 2"  (188 cm) Weight: 121.1 kg (266 lb 15.6 oz) IBW/kg (Calculated) : 82.2 Estimated Creatinine Clearance: 10.4 mL/min (A) (by C-G formula based on SCr of 12.29 mg/dL (H)).  Potassium (mmol/L)  Date Value  02/17/2023 6.4 (HH)  08/22/2014 5.1   Magnesium (mg/dL)  Date Value  45/40/9811 2.1  08/22/2014 2.3   Calcium (mg/dL)  Date Value  91/47/8295 8.0 (L)   Calcium, Total (mg/dL)  Date Value  62/13/0865 7.1 (L)   Albumin (g/dL)  Date Value  78/46/9629 3.8  08/19/2014 2.1 (L)   Phosphorus (mg/dL)  Date Value  52/84/1324 2.6  08/22/2014 6.0 (H)   Sodium (mmol/L)  Date Value  02/17/2023 137  08/22/2014 136   Corrected Ca: 8.28 mg/dL  Assessment  Patrick Downs is a 47 y.o. male presenting with hypotension after dialysis session. PMH significant for HFpEF, ESRD, HTN, hyperparathyroidism, and Guillain-Barr syndrome. Pharmacy has been consulted to monitor and replace electrolytes.  Goal of Therapy: Electrolytes WNL  Plan:  Electrolyte replacement is not warranted at this time Check electrolytes with AM labs  Thank you for allowing pharmacy to be a part of this patient's care.  Lowella Bandy, PharmD 02/17/2023 7:08 AM

## 2023-02-17 NOTE — Assessment & Plan Note (Signed)
Getting dialysis today. -Nephrology is on board-will continue routine dialysis

## 2023-02-17 NOTE — Assessment & Plan Note (Signed)
Chronic pain. History of Guillain-Barr. -Continue home Lyrica and Percocet

## 2023-02-17 NOTE — Evaluation (Signed)
Occupational Therapy Evaluation Patient Details Name: Patrick Downs MRN: [id number] DOB: 06/12/1975 Today's Date: 02/17/2023   History of Present Illness 47 year old male with history of ESRD on hemodialysis presenting to the hospital with hypotension in the setting of hemodialysis requiring peripheral vasopressors. Etiology of shock concerning for hypovolemia superimposed on Sildenafil along with adrenal insufficiency.  Patient was at rehab for assistance due to osteomyelitis and right foot requiring debridement, wound VAC and no weightbearing.  He is on ceftazidime with dialysis.  When EMS was called patient has systolic blood pressure in 50s.  Initially admitted in ICU as requiring IV pressors. Labs and lactic acid was unremarkable. Renal function at baseline. Patient was started on stress doses of steroid, later weaned off from Levophed and midodrine dose was increased to 15 mg 3 times daily. Patient also has an history of chronic hypertension alternating with hypotension requiring midodrine in the past but recently blood pressure was stable off the midodrine. Echocardiogram done on 02/14/2023 with normal EF, grade 1 diastolic dysfunction and no other significant abnormality.   Clinical Impression   [name] comes to [facility] from [facility], where he has been since leaving hospital earlier this month following osteomyelitis, R foot debridement. Pt reports he made good progress in rehab at [facility], had been building strength, but had not yet worked on getting up and down 5 steps while maintaining weightbearing precautions -- an essential step for him to transition home. During today's evaluation, pt denies any recent falls, reports no pain, is able to perform bed mobility with Mod I. He is aware of instructions of no WBing on R LE and no driving. Prior to foot infection, pt had been active in the community, working, driving himself to HD 3x/week. Pt reports he has done his own foot care management --  clipping toenails, tending to calluses (which was the beginning of his current foot infection), and has not seen a podiatrist other than for surgical foot care in the hospital. Provided educ re: importance of taking better care of his feet, monitoring for pressure injuries caused by AFOs in setting of significant b/l LE neuropathy. Recommended pt consult with podiatrist re: socks and shoes best suited for protecting feet, inquiring as to whether pt should continuing clipping his own toenails or have this done by a health care professional. Pt needs ongoing OT in hospital and post DC to ensure he is able to perform B/IADLs while maintaining WBing precautions. Prior to this month, pt was active, IND, driving, working. He displays good stamina today and could tolerate participating in rehab services 3+hour/day.     If plan is discharge home, recommend the following: A little help with walking and/or transfers;A little help with bathing/dressing/bathroom;Assistance with cooking/housework;Assist for transportation;Help with stairs or ramp for entrance    Functional Status Assessment  Patient has had a recent decline in their functional status and demonstrates the ability to make significant improvements in function in a reasonable and predictable amount of time.  Equipment Recommendations  Other (comment) (knee scooter)    Recommendations for Other Services Other (comment) (podiatry consult re: educ on maintaining overall foot health, beyond current non-WBing instructions (i.e., recommended socks to reduce risk of sores from AFOs, shoes, knee scooter, toenail trimming, regular foot inspections, etc.))     Precautions / Restrictions Precautions Precaution Comments: B foot drop (AFO's); Restrictions Weight Bearing Restrictions: Yes RLE Weight Bearing: Non weight bearing Other Position/Activity Restrictions: NWB R foot; post-op I&D R foot wound  Mobility Bed Mobility Overal bed mobility: Needs  Assistance Bed Mobility: Supine to Sit, Sit to Supine     Supine to sit: Supervision Sit to supine: Supervision        Transfers                          Balance Overall balance assessment: Needs assistance Sitting-balance support: Single extremity supported Sitting balance-Leahy Scale: Good                                     ADL either performed or assessed with clinical judgement   ADL Overall ADL's : Needs assistance/impaired                     Lower Body Dressing: Moderate assistance                       Vision         Perception         Praxis         Pertinent Vitals/Pain Pain Assessment Pain Assessment: No/denies pain     Extremity/Trunk Assessment Upper Extremity Assessment Upper Extremity Assessment: Overall WFL for tasks assessed   Lower Extremity Assessment Lower Extremity Assessment: LLE deficits/detail RLE Deficits / Details: NWBing RLE with wound vac, R foot wrapped RLE Sensation: history of peripheral neuropathy LLE Sensation: history of peripheral neuropathy   Cervical / Trunk Assessment Cervical / Trunk Assessment: Normal   Communication Communication Communication: No apparent difficulties   Cognition Arousal: Alert Behavior During Therapy: WFL for tasks assessed/performed Overall Cognitive Status: Within Functional Limits for tasks assessed                                       General Comments       Exercises Other Exercises Other Exercises: Educ re: DC recs, importance of careful attention to foot care, ongoing health mgmt, WBing status   Shoulder Instructions      Home Living Family/patient expects to be discharged to:: Private residence Living Arrangements: Alone Available Help at Discharge: Friend(s);Available PRN/intermittently;Family Type of Home: House Home Access: Stairs to enter Secretary/administrator of Steps: 5 Entrance Stairs-Rails: Left Home  Layout: One level     Bathroom Shower/Tub: Chief Strategy Officer: Handicapped height (standard toilet with BSC over it)     Home Equipment: Grab bars - tub/shower;Rolling Walker (2 wheels);BSC/3in1;Shower seat          Prior Functioning/Environment Prior Level of Function : Independent/Modified Independent;Driving;Other (comment) (HD 3x/week)             Mobility Comments: Ambulates with b/l AFOs, no recent falls history. Now NWB on R LE ADLs Comments: prior to previous admission was MOD I in ADL/IADL, recent difficulties with new NWBing status and RW use at home, no reported falls        OT Problem List: Decreased knowledge of use of DME or AE;Decreased activity tolerance;Decreased safety awareness;Decreased strength;Impaired balance (sitting and/or standing);Impaired sensation      OT Treatment/Interventions: Self-care/ADL training;Therapeutic exercise;DME and/or AE instruction;Therapeutic activities;Patient/family education    OT Goals(Current goals can be found in the care plan section) Acute Rehab OT Goals Patient Stated Goal: to get home and back to work OT Goal Formulation:  With patient Time For Goal Achievement: 03/03/23 Potential to Achieve Goals: Good ADL Goals Pt Will Perform Lower Body Dressing: sitting/lateral leans;sit to/from stand;with modified independence Pt Will Transfer to Toilet: with modified independence;ambulating;stand pivot transfer (while maintaining WBing precautions) Pt/caregiver will Perform Home Exercise Program: Increased ROM;Increased strength;Independently  OT Frequency: Min 1X/week    Co-evaluation              AM-PAC OT "6 Clicks" Daily Activity     Outcome Measure Help from another person eating meals?: None Help from another person taking care of personal grooming?: None Help from another person toileting, which includes using toliet, bedpan, or urinal?: A Little Help from another person bathing (including  washing, rinsing, drying)?: A Little Help from another person to put on and taking off regular upper body clothing?: None Help from another person to put on and taking off regular lower body clothing?: A Lot 6 Click Score: 20   End of Session Nurse Communication: Mobility status  Activity Tolerance: Patient tolerated treatment well Patient left: in bed;with call bell/phone within reach  OT Visit Diagnosis: Other abnormalities of gait and mobility (R26.89)                Time: 1450-1520 OT Time Calculation (min): 30 min Charges:  OT General Charges $OT Visit: 1 Visit OT Evaluation $OT Eval Low Complexity: 1 Low OT Treatments $Self Care/Home Management : 8-22 mins Latina Craver, PhD, MS, OTR/L 02/17/23, 4:33 PM

## 2023-02-17 NOTE — Assessment & Plan Note (Signed)
Patient was recently discharged to [facility] for rehab. -PT and OT evaluation

## 2023-02-17 NOTE — Progress Notes (Signed)
[facility]  ROUNDING NOTE   Subjective:   [name] was admitted to [facility] on 02/13/2023 for Shock circulatory [facility]) [R57.9] Hypotension, unspecified hypotension type [I95.9]  Patient is known to our practice and receives outpatient dialysis at [facility] on a TTS schedule, supervised by [facility] physicians.   Update: Patient seen and evaluated during dialysis   HEMODIALYSIS FLOWSHEET:  Blood Flow Rate (mL/min): 399 mL/min Arterial Pressure (mmHg): -201 mmHg Venous Pressure (mmHg): 184.03 mmHg TMP (mmHg): 11.92 mmHg Ultrafiltration Rate (mL/min): 495 mL/min Dialysate Flow Rate (mL/min): 299 ml/min Bolus Amount (mL): 200 mL  No complaints to offer Pain well managed  Blood pressure stable  Objective:  Vital signs in last 24 hours:  Temp:  [97.4 F (36.3 C)-98.6 F (37 C)] 97.4 F (36.3 C) (10/26 0837) Pulse Rate:  [27-98] 84 (10/26 1030) Resp:  [9-25] 16 (10/26 1030) BP: (83-142)/(40-79) 83/40 (10/26 1030) SpO2:  [85 %-100 %] 96 % (10/26 1030) Weight:  [118.7 kg-121.1 kg] 118.7 kg (10/26 0837)  Weight change: 0 kg Filed Weights   02/16/23 0500 02/17/23 0333 02/17/23 0837  Weight: 121.1 kg 121.1 kg 118.7 kg    Intake/Output: I/O last 3 completed shifts: In: 543.9 [P.O.:375; I.V.:168.9] Out: 1 [Stool:1]   Intake/Output this shift:  Total I/O In: 120 [P.O.:120] Out: 0   Physical Exam: General: No acute distress  Head: Normocephalic, atraumatic. Moist oral mucosal membranes  Lungs:  Clear to auscultation, normal effort  Heart: S1S2 no rubs  Abdomen:  Soft, nontender, bowel sounds present  Extremities: No peripheral edema.  Neurologic: Awake, alert, following commands  Skin: No acute rash, RLE wound  Access: Right IJ PermCath    Basic Metabolic Panel: Recent Labs  Lab 02/14/23 0558 02/15/23 0328 02/16/23 0542 02/17/23 0351 02/17/23 0844  NA 136 139 135 137 137  K 3.8 4.6 3.9 6.4* 4.7  CL 100 103 98 101 99  CO2 21* 20*  24 19* 21*  GLUCOSE 146* 105* 146* 120* 165*  BUN 43* 57* 47* 64* 71*  CREATININE 9.19* 11.37* 10.02* 12.29* 13.75*  CALCIUM 8.0* 8.5* 7.8* 8.0* 7.6*  MG 2.0 2.3 1.9 2.1  --   PHOS 3.3 4.0 3.6 2.6 2.1*    Liver Function Tests: Recent Labs  Lab 02/13/23 1624 02/15/23 0328 02/17/23 0351 02/17/23 0844  AST 27  --   --   --   ALT 30  --   --   --   ALKPHOS 82  --   --   --   BILITOT 0.7  --   --   --   PROT 8.7*  --   --   --   ALBUMIN 3.9 3.4* 3.8 3.3*   No results for input(s): "LIPASE", "AMYLASE" in the last 168 hours. No results for input(s): "AMMONIA" in the last 168 hours.  CBC: Recent Labs  Lab 02/13/23 1624 02/14/23 0558 02/15/23 0328 02/16/23 0542 02/17/23 0351  WBC 9.1 10.2 7.8 5.7 7.9  NEUTROABS 6.7  --   --   --   --   HGB 12.2* 11.2* 11.5* 10.9* 12.6*  HCT 38.7* 34.1* 36.0* 33.3* 39.4  MCV 91.9 88.6 90.2 87.9 90.4  PLT 118* 157 148* 126* 146*    Cardiac Enzymes: No results for input(s): "CKTOTAL", "CKMB", "CKMBINDEX", "TROPONINI" in the last 168 hours.  BNP: Invalid input(s): "POCBNP"  CBG: Recent Labs  Lab 02/14/23 0235  GLUCAP 93     Microbiology: Results for orders placed or performed during  the hospital encounter of 02/13/23  MRSA Next Gen by PCR, Nasal     Status: None   Collection Time: 02/14/23  2:15 AM   Specimen: Nasal Mucosa; Nasal Swab  Result Value Ref Range Status   MRSA by PCR Next Gen NOT DETECTED NOT DETECTED Final    Comment: (NOTE) The GeneXpert MRSA Assay (FDA approved for NASAL specimens only), is one component of a comprehensive MRSA colonization surveillance program. It is not intended to diagnose MRSA infection nor to guide or monitor treatment for MRSA infections. Test performance is not FDA approved in patients less than 58 years old. Performed at [facility], [address] [place], [place] [address]     Coagulation Studies: No results for input(s): "LABPROT", "INR" in the last 72  hours.   Urinalysis: No results for input(s): "COLORURINE", "LABSPEC", "PHURINE", "GLUCOSEU", "HGBUR", "BILIRUBINUR", "KETONESUR", "PROTEINUR", "UROBILINOGEN", "NITRITE", "LEUKOCYTESUR" in the last 72 hours.  Invalid input(s): "APPERANCEUR"    Imaging: No results found.   Medications:    cefTAZidime (FORTAZ)  IV Stopped (02/15/23 1402)    ascorbic acid  500 mg Oral BID   aspirin EC  81 mg Oral Daily   atorvastatin  40 mg Oral Daily   Chlorhexidine Gluconate Cloth  6 each Topical QHS   clopidogrel  75 mg Oral Daily   heparin  5,000 Units Subcutaneous Q8H   hydrocortisone sod succinate (SOLU-CORTEF) inj  100 mg Intravenous Q8H   metroNIDAZOLE  500 mg Oral BID   midodrine  15 mg Oral TID WC   pregabalin  25 mg Oral Daily   zinc sulfate  220 mg Oral Daily   alteplase, docusate sodium, heparin, heparin, heparin sodium (porcine), hydrOXYzine, lidocaine-prilocaine, mouth rinse, oxyCODONE-acetaminophen, pentafluoroprop-tetrafluoroeth, polyethylene glycol  Assessment/ Plan:  47 y.o. male ESRD on HD TTS, CHF, history of Guillain-Barr syndrome, hypertension, anemia of chronic kidney disease, secondary hyperparathyroidism, history of right foot toe amputation who presents with ulceration on lateral aspect of right foot.  [facility] [address]  1.  ESRD on HD TTS.  Receiving dialysis, UF goal 1L as tolerated. Next treatment scheduled for Tuesday  2.  Anemia of chronic kidney disease.   Lab Results  Component Value Date   HGB 12.6 (L) 02/17/2023   Patient receives Mircera as outpatient. Hgb acceptable, no need for ESA at this time  3.  Secondary hyperparathyroidism with outpatient labs: PTH 204, calcium 6.9, and phos 7.6 on 02/06/23  - Calcium and phosphorus decreased - No binders prescribed at this time.   4.  Diabetes mellitus type II with chronic kidney disease and complication of right foot osteomyelitis:  - Continue ceftazidime 1g with HD treatments until  02/19/23  5. Hypotension, believed due to hypovolemia and sedation. Placed in ICU with Levophed drip and midodrine.   Levo weaned off. Continue Midodrine   LOS: 3 Kaylana Fenstermacher 10/26/202411:31 AM

## 2023-02-17 NOTE — Assessment & Plan Note (Signed)
Still some intermittent softer blood pressure but he remains asymptomatic. -Continue with midodrine -Started tapering Solu-Cortef

## 2023-02-17 NOTE — Progress Notes (Signed)
Received patient in bed to unit.    Informed consent signed and in chart.    TX duration: 3.5 hrs     Transported back to floor  Hand-off given to patient's nurse.   Access used:  rt chest cvc Access issues: n/a  Total UF removed: 800 mls Medication(s) given: Elita Quick 1gram IV     Maple Hudson, RN Dialysis Unit

## 2023-02-18 DIAGNOSIS — M869 Osteomyelitis, unspecified: Secondary | ICD-10-CM | POA: Diagnosis not present

## 2023-02-18 DIAGNOSIS — I739 Peripheral vascular disease, unspecified: Secondary | ICD-10-CM | POA: Diagnosis not present

## 2023-02-18 DIAGNOSIS — N186 End stage renal disease: Secondary | ICD-10-CM | POA: Diagnosis not present

## 2023-02-18 DIAGNOSIS — R579 Shock, unspecified: Secondary | ICD-10-CM | POA: Diagnosis not present

## 2023-02-18 LAB — RENAL FUNCTION PANEL
Albumin: 3.8 g/dL (ref 3.5–5.0)
Anion gap: 16 — ABNORMAL HIGH (ref 5–15)
BUN: 58 mg/dL — ABNORMAL HIGH (ref 6–20)
CO2: 23 mmol/L (ref 22–32)
Calcium: 7.8 mg/dL — ABNORMAL LOW (ref 8.9–10.3)
Chloride: 99 mmol/L (ref 98–111)
Creatinine, Ser: 9.64 mg/dL — ABNORMAL HIGH (ref 0.61–1.24)
GFR, Estimated: 6 mL/min — ABNORMAL LOW (ref >60)
Glucose, Bld: 164 mg/dL — ABNORMAL HIGH (ref 70–99)
Phosphorus: 3.5 mg/dL (ref 2.5–4.6)
Potassium: 4.5 mmol/L (ref 3.5–5.1)
Sodium: 138 mmol/L (ref 135–145)

## 2023-02-18 LAB — CBC
HCT: 35.4 % — ABNORMAL LOW (ref 39.0–52.0)
Hemoglobin: 11.5 g/dL — ABNORMAL LOW (ref 13.0–17.0)
MCH: 28.9 pg (ref 26.0–34.0)
MCHC: 32.5 g/dL (ref 30.0–36.0)
MCV: 88.9 fL (ref 80.0–100.0)
Platelets: 158 10*3/uL (ref 150–400)
RBC: 3.98 MIL/uL — ABNORMAL LOW (ref 4.22–5.81)
RDW: 14.6 % (ref 11.5–15.5)
WBC: 7.7 10*3/uL (ref 4.0–10.5)
nRBC: 0 % (ref 0.0–0.2)

## 2023-02-18 MED ORDER — MIDODRINE HCL 5 MG PO TABS
5.0000 mg | ORAL_TABLET | Freq: Three times a day (TID) | ORAL | Status: DC
  Administered 2023-02-18 – 2023-02-22 (×11): 5 mg via ORAL
  Filled 2023-02-18 (×10): qty 1

## 2023-02-18 MED ORDER — PREDNISONE 50 MG PO TABS
50.0000 mg | ORAL_TABLET | Freq: Every day | ORAL | Status: DC
  Administered 2023-02-19 – 2023-02-21 (×3): 50 mg via ORAL
  Filled 2023-02-18 (×2): qty 1

## 2023-02-18 MED ORDER — MIDODRINE HCL 5 MG PO TABS: 5.0000 mg | ORAL_TABLET | Freq: Three times a day (TID) | ORAL | Status: DC

## 2023-02-18 NOTE — Progress Notes (Signed)
Report called to brandy. Patient status gone over with nurse. Patient to be transported via bed, no telemetry on ra

## 2023-02-18 NOTE — Plan of Care (Signed)
  Problem: Education: Goal: Knowledge of General Education information will improve Description: Including pain rating scale, medication(s)/side effects and non-pharmacologic comfort measures Outcome: Progressing   Problem: Health Behavior/Discharge Planning: Goal: Ability to manage health-related needs will improve Outcome: Progressing   Problem: Clinical Measurements: Goal: Ability to maintain clinical measurements within normal limits will improve Outcome: Progressing Goal: Will remain free from infection Outcome: Progressing Goal: Diagnostic test results will improve Outcome: Progressing Goal: Cardiovascular complication will be avoided Outcome: Progressing   Problem: Activity: Goal: Risk for activity intolerance will decrease Outcome: Progressing   Problem: Coping: Goal: Level of anxiety will decrease Outcome: Progressing   Problem: Pain Management: Goal: General experience of comfort will improve Outcome: Progressing   Problem: Safety: Goal: Ability to remain free from injury will improve Outcome: Progressing   Problem: Skin Integrity: Goal: Risk for impaired skin integrity will decrease Outcome: Progressing

## 2023-02-18 NOTE — Progress Notes (Signed)
[facility]  ROUNDING NOTE   Subjective:   [name] was admitted to [facility] on 02/13/2023 for Shock circulatory [facility]) [R57.9] Hypotension, unspecified hypotension type [I95.9]  Patient is known to our practice and receives outpatient dialysis at [facility] on a TTS schedule, supervised by [facility] physicians.   Update:  Patient seen sitting up in bed Alert and appears well today Denies pain from right foot wound Negative pressure wound VAC in place, minimal drainage  Objective:  Vital signs in last 24 hours:  Temp:  [97.6 F (36.4 C)-98.7 F (37.1 C)] 98.1 F (36.7 C) (10/27 0855) Pulse Rate:  [41-125] 65 (10/27 0855) Resp:  [11-31] 18 (10/27 0855) BP: (79-164)/(34-79) 151/75 (10/27 0855) SpO2:  [90 %-100 %] 99 % (10/27 0855) Weight:  [118.6 kg-118.7 kg] 118.6 kg (10/27 0706)  Weight change: -2.4 kg Filed Weights   02/17/23 0837 02/17/23 1250 02/18/23 0706  Weight: 118.7 kg 118.7 kg 118.6 kg    Intake/Output: I/O last 3 completed shifts: In: 1425 [P.O.:1325; IV Piggyback:100] Out: 801 [Other:800; Stool:1]   Intake/Output this shift:  No intake/output data recorded.  Physical Exam: General: No acute distress  Head: Normocephalic, atraumatic. Moist oral mucosal membranes  Lungs:  Clear to auscultation, normal effort  Heart: S1S2 no rubs  Abdomen:  Soft, nontender, bowel sounds present  Extremities: No peripheral edema.  Neurologic: Awake, alert, following commands  Skin: No acute rash, RLE wound (NPWV)  Access: Right IJ PermCath    Basic Metabolic Panel: Recent Labs  Lab 02/14/23 0558 02/15/23 0328 02/16/23 0542 02/17/23 0351 02/17/23 0844 02/18/23 0552  NA 136 139 135 137 137 138  K 3.8 4.6 3.9 6.4* 4.7 4.5  CL 100 103 98 101 99 99  CO2 21* 20* 24 19* 21* 23  GLUCOSE 146* 105* 146* 120* 165* 164*  BUN 43* 57* 47* 64* 71* 58*  CREATININE 9.19* 11.37* 10.02* 12.29* 13.75* 9.64*  CALCIUM 8.0* 8.5* 7.8* 8.0* 7.6* 7.8*  MG  2.0 2.3 1.9 2.1  --   --   PHOS 3.3 4.0 3.6 2.6 2.1* 3.5    Liver Function Tests: Recent Labs  Lab 02/13/23 1624 02/15/23 0328 02/17/23 0351 02/17/23 0844 02/18/23 0552  AST 27  --   --   --   --   ALT 30  --   --   --   --   ALKPHOS 82  --   --   --   --   BILITOT 0.7  --   --   --   --   PROT 8.7*  --   --   --   --   ALBUMIN 3.9 3.4* 3.8 3.3* 3.8   No results for input(s): "LIPASE", "AMYLASE" in the last 168 hours. No results for input(s): "AMMONIA" in the last 168 hours.  CBC: Recent Labs  Lab 02/13/23 1624 02/14/23 0558 02/15/23 0328 02/16/23 0542 02/17/23 0351 02/18/23 0552  WBC 9.1 10.2 7.8 5.7 7.9 7.7  NEUTROABS 6.7  --   --   --   --   --   HGB 12.2* 11.2* 11.5* 10.9* 12.6* 11.5*  HCT 38.7* 34.1* 36.0* 33.3* 39.4 35.4*  MCV 91.9 88.6 90.2 87.9 90.4 88.9  PLT 118* 157 148* 126* 146* 158    Cardiac Enzymes: No results for input(s): "CKTOTAL", "CKMB", "CKMBINDEX", "TROPONINI" in the last 168 hours.  BNP: Invalid input(s): "POCBNP"  CBG: Recent Labs  Lab 02/14/23 0235  GLUCAP 93  Microbiology: Results for orders placed or performed during the hospital encounter of 02/13/23  MRSA Next Gen by PCR, Nasal     Status: None   Collection Time: 02/14/23  2:15 AM   Specimen: Nasal Mucosa; Nasal Swab  Result Value Ref Range Status   MRSA by PCR Next Gen NOT DETECTED NOT DETECTED Final    Comment: (NOTE) The GeneXpert MRSA Assay (FDA approved for NASAL specimens only), is one component of a comprehensive MRSA colonization surveillance program. It is not intended to diagnose MRSA infection nor to guide or monitor treatment for MRSA infections. Test performance is not FDA approved in patients less than 8 years old. Performed at [facility], [address] [place], [place] [address]     Coagulation Studies: No results for input(s): "LABPROT", "INR" in the last 72 hours.   Urinalysis: No results for input(s): "COLORURINE", "LABSPEC",  "PHURINE", "GLUCOSEU", "HGBUR", "BILIRUBINUR", "KETONESUR", "PROTEINUR", "UROBILINOGEN", "NITRITE", "LEUKOCYTESUR" in the last 72 hours.  Invalid input(s): "APPERANCEUR"    Imaging: No results found.   Medications:    cefTAZidime (FORTAZ)  IV Stopped (02/17/23 1219)    (feeding supplement) PROSource Plus  30 mL Oral TID BM   ascorbic acid  500 mg Oral BID   aspirin EC  81 mg Oral Daily   atorvastatin  40 mg Oral Daily   Chlorhexidine Gluconate Cloth  6 each Topical QHS   clopidogrel  75 mg Oral Daily   heparin  5,000 Units Subcutaneous Q8H   hydrocortisone sod succinate (SOLU-CORTEF) inj  100 mg Intravenous Q12H   metroNIDAZOLE  500 mg Oral BID   midodrine  5 mg Oral TID WC   multivitamin  1 tablet Oral QHS   nutrition supplement (JUVEN)  1 packet Oral BID BM   pregabalin  25 mg Oral Daily   zinc sulfate  220 mg Oral Daily   docusate sodium, heparin sodium (porcine), hydrOXYzine, mouth rinse, oxyCODONE-acetaminophen, polyethylene glycol  Assessment/ Plan:  47 y.o. male ESRD on HD TTS, CHF, history of Guillain-Barr syndrome, hypertension, anemia of chronic kidney disease, secondary hyperparathyroidism, history of right foot toe amputation who presents with ulceration on lateral aspect of right foot.  [facility] [address]  1.  ESRD on HD TTS.  Dialysis received yesterday, UF 800 mL achieved.  Next treatment scheduled for Tuesday  2.  Anemia of chronic kidney disease.   Lab Results  Component Value Date   HGB 11.5 (L) 02/18/2023   Patient receives Mircera as outpatient.  Hemoglobin stable  3.  Secondary hyperparathyroidism with outpatient labs: PTH 204, calcium 6.9, and phos 7.6 on 02/06/23  -Will continue to monitor bone minerals during this admission.  4.  Diabetes mellitus type II with chronic kidney disease and complication of right foot osteomyelitis:  -Completed ceftazidime per ID recommendations  5. Hypotension, believed due to hypovolemia and  sedation. Placed in ICU with Levophed drip and midodrine.   Continue Midodrine, may require increase of dosing prior to dialysis.   LOS: 4 Hershy Flenner 10/27/202412:15 PM

## 2023-02-18 NOTE — Evaluation (Signed)
Physical Therapy Evaluation Patient Details Name: Patrick Downs MRN: [id number] DOB: March 12, 1976 Today's Date: 02/18/2023  History of Present Illness  47 year old male with history of ESRD on hemodialysis presenting to the hospital with hypotension in the setting of hemodialysis requiring peripheral vasopressors. Etiology of shock concerning for hypovolemia superimposed on Sildenafil along with adrenal insufficiency.  Patient was at rehab for assistance due to osteomyelitis and right foot requiring debridement, wound VAC and no weightbearing.  He is on ceftazidime with dialysis.  When EMS was called patient has systolic blood pressure in 50s.  Initially admitted in ICU as requiring IV pressors. Labs and lactic acid was unremarkable. Renal function at baseline. Patient was started on stress doses of steroid, later weaned off from Levophed and midodrine dose was increased to 15 mg 3 times daily. Patient also has an history of chronic hypertension alternating with hypotension requiring midodrine in the past but recently blood pressure was stable off the midodrine.     Echocardiogram done on 02/14/2023 with normal EF, grade 1 diastolic dysfunction and no other significant abnormality.   Clinical Impression  Pt received in Semi-Fowler's position and agreeable to therapy.  Pt did not have brace, but reported that during his time at [facility], he had utilized the knee scooter and feels as though he could use one here if possible without the AFO on the L LE.  Therapist brought knee scooter to room and pt was able to transfer well with use of the braking mechanism and then propel himself around the nursing station.  Pt would benefit from having one at discharge to SNF.  Will continue to work with pt on ambulation and attempt to assist with transfers up/down stairs during future sessions.          If plan is discharge home, recommend the following: A little help with walking and/or transfers;A little help with  bathing/dressing/bathroom;Assistance with cooking/housework;Assist for transportation;Help with stairs or ramp for entrance   Can travel by private vehicle   Yes    Equipment Recommendations Other (comment) (would benefit from a knee scooter)  Recommendations for Other Services       Functional Status Assessment Patient has had a recent decline in their functional status and demonstrates the ability to make significant improvements in function in a reasonable and predictable amount of time.     Precautions / Restrictions Precautions Precautions: Fall Restrictions RLE Weight Bearing: Non weight bearing Other Position/Activity Restrictions: NWB R foot; post-op I&D R foot wound      Mobility  Bed Mobility Overal bed mobility: Needs Assistance Bed Mobility: Supine to Sit, Sit to Supine     Supine to sit: Supervision Sit to supine: Supervision        Transfers Overall transfer level: Needs assistance Equipment used:  (knee scooter) Transfers: Sit to/from Stand                  Ambulation/Gait Ambulation/Gait assistance: Contact guard assist Gait Distance (Feet): 160 Feet Assistive device: Knee scooter   Gait velocity: WFL     General Gait Details: Pt able to propel himself with the rolling knee scooter and not have any complications.  Stairs            Wheelchair Mobility     Tilt Bed    Modified Rankin (Stroke Patients Only)       Balance Overall balance assessment: Needs assistance Sitting-balance support: Single extremity supported Sitting balance-Leahy Scale: Good     Standing balance  support: Bilateral upper extremity supported, During functional activity Standing balance-Leahy Scale: Fair                               Pertinent Vitals/Pain Pain Assessment Pain Assessment: No/denies pain    Home Living Family/patient expects to be discharged to:: Private residence Living Arrangements: Alone Available Help at  Discharge: Friend(s);Available PRN/intermittently;Family Type of Home: House Home Access: Stairs to enter Entrance Stairs-Rails: Left Entrance Stairs-Number of Steps: 5   Home Layout: One level Home Equipment: Grab bars - tub/shower;Rolling Walker (2 wheels);BSC/3in1;Shower seat      Prior Function Prior Level of Function : Independent/Modified Independent;Driving;Other (comment) (HD 3x/week)             Mobility Comments: Ambulates with b/l AFOs, no recent falls history. Now NWB on R LE ADLs Comments: prior to previous admission was MOD I in ADL/IADL, recent difficulties with new NWBing status and RW use at home, no reported falls     Extremity/Trunk Assessment   Upper Extremity Assessment Upper Extremity Assessment: Overall WFL for tasks assessed    Lower Extremity Assessment Lower Extremity Assessment: Overall WFL for tasks assessed RLE Deficits / Details: NWBing RLE with wound vac, R foot wrapped RLE Sensation: history of peripheral neuropathy LLE Sensation: history of peripheral neuropathy    Cervical / Trunk Assessment Cervical / Trunk Assessment: Normal  Communication   Communication Communication: No apparent difficulties  Cognition Arousal: Alert Behavior During Therapy: WFL for tasks assessed/performed Overall Cognitive Status: Within Functional Limits for tasks assessed                                 General Comments: Pleasant.        General Comments      Exercises     Assessment/Plan    PT Assessment Patient needs continued PT services  PT Problem List Decreased strength;Decreased activity tolerance;Decreased balance;Decreased mobility;Decreased knowledge of use of DME;Decreased knowledge of precautions;Decreased skin integrity;Pain       PT Treatment Interventions DME instruction;Gait training;Stair training;Functional mobility training;Therapeutic activities;Therapeutic exercise;Balance training;Patient/family education     PT Goals (Current goals can be found in the Care Plan section)  Acute Rehab PT Goals Patient Stated Goal: to get back to rehab facility PT Goal Formulation: With patient Time For Goal Achievement: 03/04/23 Potential to Achieve Goals: Good    Frequency Min 1X/week     Co-evaluation               AM-PAC PT "6 Clicks" Mobility  Outcome Measure Help needed turning from your back to your side while in a flat bed without using bedrails?: A Little Help needed moving from lying on your back to sitting on the side of a flat bed without using bedrails?: A Little Help needed moving to and from a bed to a chair (including a wheelchair)?: A Lot Help needed standing up from a chair using your arms (e.g., wheelchair or bedside chair)?: A Little Help needed to walk in hospital room?: A Lot Help needed climbing 3-5 steps with a railing? : Total 6 Click Score: 14    End of Session   Activity Tolerance: Patient tolerated treatment well Patient left: in bed;with call bell/phone within reach;with bed alarm set Nurse Communication: Mobility status;Precautions PT Visit Diagnosis: Unsteadiness on feet (R26.81);Other abnormalities of gait and mobility (R26.89);Muscle weakness (generalized) (M62.81)  Time: 4098-1191 PT Time Calculation (min) (ACUTE ONLY): 35 min   Charges:   PT Evaluation $PT Eval Low Complexity: 1 Low PT Treatments $Therapeutic Activity: 8-22 mins PT General Charges $$ ACUTE PT VISIT: 1 Visit         Nolon Bussing, PT, DPT Physical Therapist - [facility]  02/18/23, 4:24 PM

## 2023-02-18 NOTE — Consult Note (Signed)
PHARMACY CONSULT NOTE - ELECTROLYTES  Pharmacy Consult for Electrolyte Monitoring and Replacement   Recent Labs: Height: 6\' 2"  (188 cm) Weight: 118.7 kg (261 lb 11 oz) IBW/kg (Calculated) : 82.2 Estimated Creatinine Clearance: 13.1 mL/min (A) (by C-G formula based on SCr of 9.64 mg/dL (H)).  Potassium (mmol/L)  Date Value  02/18/2023 4.5  08/22/2014 5.1   Magnesium (mg/dL)  Date Value  16/01/9603 2.1  08/22/2014 2.3   Calcium (mg/dL)  Date Value  54/12/8117 7.8 (L)   Calcium, Total (mg/dL)  Date Value  14/78/2956 7.1 (L)   Albumin (g/dL)  Date Value  21/30/8657 3.8  08/19/2014 2.1 (L)   Phosphorus (mg/dL)  Date Value  84/69/6295 3.5  08/22/2014 6.0 (H)   Sodium (mmol/L)  Date Value  02/18/2023 138  08/22/2014 136   Corrected Ca: 8.28 mg/dL  Assessment  Patrick Downs is a 47 y.o. male presenting with hypotension after dialysis session. PMH significant for HFpEF, ESRD, HTN, hyperparathyroidism, and Guillain-Barr syndrome. Pharmacy has been consulted to monitor and replace electrolytes.  Goal of Therapy: Electrolytes WNL  Plan:  No replacement needed F/u with AM labs.   Thank you for allowing pharmacy to be a part of this patient's care.  Ronnald Ramp, PharmD 02/18/2023 7:34 AM

## 2023-02-18 NOTE — Assessment & Plan Note (Signed)
Blood pressure started trending up.  History of hypo and hypertension. -Decreasing the dose of midodrine to 5 mg 3 times daily with holding parameter -Switching Solu-Cortef with prednisone from tomorrow with a quick taper

## 2023-02-18 NOTE — Progress Notes (Signed)
Progress Note   Patient: Patrick Downs WUJ:[id number] DOB: [birth year] DOA: 02/13/2023     4 DOS: the patient was seen and examined on 02/18/2023   Brief hospital course: ICU transfer for 02/17/2023.  Taken from prior notes.  47 year old male with history of ESRD on hemodialysis presenting to the hospital with hypotension in the setting of hemodialysis requiring peripheral vasopressors.  Etiology of shock concerning for hypovolemia superimposed on Sildenafil along with adrenal insufficiency.   Patient was at rehab for assistance due to osteomyelitis and right foot requiring debridement, wound VAC and no weightbearing.  He is on ceftazidime with dialysis.  When EMS was called patient has systolic blood pressure in 50s.  Initially admitted in ICU as requiring IV pressors.  Labs and lactic acid was unremarkable.  Renal function at baseline.  Patient was started on stress doses of steroid, later weaned off from Levophed and midodrine dose was increased to 15 mg 3 times daily.  Patient also has an history of chronic hypertension alternating with hypotension requiring midodrine in the past but recently blood pressure was stable off the midodrine.  Echocardiogram done on 02/14/2023 with normal EF, grade 1 diastolic dysfunction and no other significant abnormality.  10/26: Patient with some intermittent mild hypotension during dialysis today, no symptoms.  Remained on high dose of Solu-Cortef with 100 mg Q8 hourly-started tapering. Remained on ceftazidime with dialysis for wound infection. PT/OT evaluation ordered.  As patient was recently discharged to SNF for rehab.  10/27: Hemodynamically stable, PT is recommending CIR but unfortunately insurance will not pay and patient will go back to his prior skilled nursing facility for further rehab. Blood pressure started trending up, decreasing midodrine to 5 mg with holding parameters. Will start on prednisone from tomorrow.  Assessment and  Plan: * Shock circulatory [facility]) Patient presented with significant hypotension requiring pressors.  Likely multifactorial due to dialysis, using sildenafil and there was a concern of adrenal insufficiency. Now maintaining blood pressure although intermittently becoming soft on midodrine. He was also placed on stress doses of steroid -Decreasing the dose of midodrine to 5 mg 3 times daily with holding parameter -Solu-Cortef is being switched with prednisone from tomorrow -Monitor blood pressure -Encourage p.o. hydration  Osteomyelitis of right foot (HCC) S/p debridement and wound VAC in place Patient was recently discharged to [facility] with wound VAC and to continue ceftazidime with dialysis and twice daily Flagyl until 10/28. -ID is on board and will decide after seeing wound during wound VAC change on Monday whether to continue antibiotics.   Hypotension Blood pressure started trending up.  History of hypo and hypertension. -Decreasing the dose of midodrine to 5 mg 3 times daily with holding parameter -Switching Solu-Cortef with prednisone from tomorrow with a quick taper  Peripheral neuropathy Chronic pain. History of Guillain-Barr. -Continue home Lyrica and Percocet  ESRD (end stage renal disease) (HCC) Getting dialysis today. -Nephrology is on board-will continue routine dialysis  PVD (peripheral vascular disease) (HCC) S/p PCI by vascular surgery on 01/24/2023. -Continue with aspirin and Plavix -Continue with statin  Impaired mobility Patient was recently discharged to [facility] for rehab. -PT and OT evaluation   Subjective: Patient was seen and examined today.  No new concern.  Physical Exam: Vitals:   02/18/23 0630 02/18/23 0706 02/18/23 0800 02/18/23 0855  BP:   (!) 104/57 (!) 151/75  Pulse: 72  69 65  Resp: 13  14 18   Temp:   97.6 F (36.4 C) 98.1 F (36.7  C)  TempSrc:   Oral Oral  SpO2: 94%  93% 99%  Weight:  118.6 kg    Height:        General.  Obese gentleman, in no acute distress. Pulmonary.  Lungs clear bilaterally, normal respiratory effort. CV.  Regular rate and rhythm, no JVD, rub or murmur. Abdomen.  Soft, nontender, nondistended, BS positive. CNS.  Alert and oriented .  No focal neurologic deficit. Extremities.  No edema, no cyanosis, pulses intact and symmetrical.  Right foot with bandage and wound VAC Psychiatry.  Judgment and insight appears normal.    Data Reviewed: Prior data reviewed  Family Communication: Discussed with patient  Disposition: Status is: Inpatient Remains inpatient appropriate because: Severity of illness  Planned Discharge Destination: SNF  DVT prophylaxis.  Subcu heparin Time spent: 50 minutes  This record has been created using Conservation officer, historic buildings. Errors have been sought and corrected,but may not always be located. Such creation errors do not reflect on the standard of care.   Author: Arnetha Courser, MD 02/18/2023 4:00 PM  For on call review [email or web address].

## 2023-02-18 NOTE — Assessment & Plan Note (Signed)
S/p debridement and wound VAC in place Patient was recently discharged to [facility] with wound VAC and to continue ceftazidime with dialysis and twice daily Flagyl until 10/28. -ID is on board and will decide after seeing wound during wound VAC change on Monday whether to continue antibiotics.

## 2023-02-18 NOTE — Assessment & Plan Note (Signed)
Patient presented with significant hypotension requiring pressors.  Likely multifactorial due to dialysis, using sildenafil and there was a concern of adrenal insufficiency. Now maintaining blood pressure although intermittently becoming soft on midodrine. He was also placed on stress doses of steroid -Decreasing the dose of midodrine to 5 mg 3 times daily with holding parameter -Solu-Cortef is being switched with prednisone from tomorrow -Monitor blood pressure -Encourage p.o. hydration

## 2023-02-18 NOTE — Progress Notes (Signed)
Inpatient Rehab Admissions Coordinator:   Per therapy recommendations,  patient was screened for CIR candidacy by Megan Salon, MS, CCC-SLP.  Pt. Was previously completing STR at SNF and doing well there. I do not think Pt.'s payor will approve CIR admission for his diagnoses. I will not pursue CIR admission for this Pt. Recommend return to SNF to complete his rehab once medically stable. Please contact me any with questions.  Megan Salon, MS, CCC-SLP Rehab Admissions Coordinator  [phone] (celll) [phone] (office)

## 2023-02-19 ENCOUNTER — Encounter: Payer: Self-pay | Admitting: Anesthesiology

## 2023-02-19 ENCOUNTER — Inpatient Hospital Stay (HOSPITAL_BASED_OUTPATIENT_CLINIC_OR_DEPARTMENT_OTHER): Payer: 59 | Admitting: Anesthesiology

## 2023-02-19 DIAGNOSIS — R29898 Other symptoms and signs involving the musculoskeletal system: Secondary | ICD-10-CM

## 2023-02-19 DIAGNOSIS — Z992 Dependence on renal dialysis: Secondary | ICD-10-CM

## 2023-02-19 DIAGNOSIS — E1122 Type 2 diabetes mellitus with diabetic chronic kidney disease: Secondary | ICD-10-CM | POA: Diagnosis not present

## 2023-02-19 DIAGNOSIS — G610 Guillain-Barre syndrome: Secondary | ICD-10-CM | POA: Diagnosis not present

## 2023-02-19 DIAGNOSIS — R579 Shock, unspecified: Secondary | ICD-10-CM | POA: Diagnosis not present

## 2023-02-19 DIAGNOSIS — I739 Peripheral vascular disease, unspecified: Secondary | ICD-10-CM | POA: Diagnosis not present

## 2023-02-19 DIAGNOSIS — Z79891 Long term (current) use of opiate analgesic: Secondary | ICD-10-CM

## 2023-02-19 DIAGNOSIS — E1169 Type 2 diabetes mellitus with other specified complication: Secondary | ICD-10-CM | POA: Diagnosis not present

## 2023-02-19 DIAGNOSIS — M47816 Spondylosis without myelopathy or radiculopathy, lumbar region: Secondary | ICD-10-CM | POA: Diagnosis not present

## 2023-02-19 DIAGNOSIS — M8660 Other chronic osteomyelitis, unspecified site: Secondary | ICD-10-CM | POA: Diagnosis not present

## 2023-02-19 DIAGNOSIS — M25552 Pain in left hip: Secondary | ICD-10-CM | POA: Diagnosis not present

## 2023-02-19 DIAGNOSIS — G894 Chronic pain syndrome: Secondary | ICD-10-CM

## 2023-02-19 DIAGNOSIS — G8929 Other chronic pain: Secondary | ICD-10-CM

## 2023-02-19 DIAGNOSIS — N186 End stage renal disease: Secondary | ICD-10-CM

## 2023-02-19 DIAGNOSIS — M869 Osteomyelitis, unspecified: Secondary | ICD-10-CM | POA: Diagnosis not present

## 2023-02-19 DIAGNOSIS — F1190 Opioid use, unspecified, uncomplicated: Secondary | ICD-10-CM

## 2023-02-19 LAB — CBC
HCT: 36.7 % — ABNORMAL LOW (ref 39.0–52.0)
Hemoglobin: 11.7 g/dL — ABNORMAL LOW (ref 13.0–17.0)
MCH: 28.7 pg (ref 26.0–34.0)
MCHC: 31.9 g/dL (ref 30.0–36.0)
MCV: 90 fL (ref 80.0–100.0)
Platelets: 135 10*3/uL — ABNORMAL LOW (ref 150–400)
RBC: 4.08 MIL/uL — ABNORMAL LOW (ref 4.22–5.81)
RDW: 14.6 % (ref 11.5–15.5)
WBC: 7.7 10*3/uL (ref 4.0–10.5)
nRBC: 0 % (ref 0.0–0.2)

## 2023-02-19 LAB — RENAL FUNCTION PANEL
Albumin: 4 g/dL (ref 3.5–5.0)
Anion gap: 20 — ABNORMAL HIGH (ref 5–15)
BUN: 80 mg/dL — ABNORMAL HIGH (ref 6–20)
CO2: 22 mmol/L (ref 22–32)
Calcium: 7.6 mg/dL — ABNORMAL LOW (ref 8.9–10.3)
Chloride: 100 mmol/L (ref 98–111)
Creatinine, Ser: 11.68 mg/dL — ABNORMAL HIGH (ref 0.61–1.24)
GFR, Estimated: 5 mL/min — ABNORMAL LOW (ref >60)
Glucose, Bld: 90 mg/dL (ref 70–99)
Phosphorus: 6.4 mg/dL — ABNORMAL HIGH (ref 2.5–4.6)
Potassium: 4.8 mmol/L (ref 3.5–5.1)
Sodium: 142 mmol/L (ref 135–145)

## 2023-02-19 MED ORDER — OXYCODONE-ACETAMINOPHEN 10-325 MG PO TABS: 1.0000 | ORAL_TABLET | ORAL | 0 refills | Status: DC | PRN

## 2023-02-19 NOTE — Progress Notes (Signed)
Physical Therapy Treatment Patient Details Name: Patrick Downs MRN: [id number] DOB: [birth year] Today's Date: 02/19/2023   History of Present Illness 47 year old male with history of ESRD on hemodialysis presenting to the hospital with hypotension in the setting of hemodialysis requiring peripheral vasopressors. Etiology of shock concerning for hypovolemia superimposed on Sildenafil along with adrenal insufficiency.  Patient was at rehab for assistance due to osteomyelitis and right foot requiring debridement, wound VAC and no weightbearing.  He is on ceftazidime with dialysis.  When EMS was called patient has systolic blood pressure in 50s.  Initially admitted in ICU as requiring IV pressors. Labs and lactic acid was unremarkable. Renal function at baseline. Patient was started on stress doses of steroid, later weaned off from Levophed and midodrine dose was increased to 15 mg 3 times daily. Patient also has an history of chronic hypertension alternating with hypotension requiring midodrine in the past but recently blood pressure was stable off the midodrine.     Echocardiogram done on 02/14/2023 with normal EF, grade 1 diastolic dysfunction and no other significant abnormality.    PT Comments  Pt received in Semi-Fowler's position and agreeable to therapy.  Pt performed transfer to knee scooter and was able to mobilize around the nursing station and then down to the PT gym without any complication.  Pt then attempted to transfer to stairs when he lost his balance slightly and placed weight through the R LE while wearing boot.  Pt did not place full weight bearing on the foot, but was advised to be more careful as he tends to be impulsive at times.  Pt then attempted to ambulate with bilateral railing up the steps.  Pt performed well with the task, bouncing prior to hop-to attempts.  Pt able to ascend/descend 4 steps with fair technique, but would benefit from continued practice.  Pt then returned to bed  and was left with family member in the room and call bell within reach.     If plan is discharge home, recommend the following: A little help with walking and/or transfers;A little help with bathing/dressing/bathroom;Assistance with cooking/housework;Assist for transportation;Help with stairs or ramp for entrance   Can travel by private vehicle     Yes  Equipment Recommendations  Other (comment) (would benefit from a knee scooter)    Recommendations for Other Services       Precautions / Restrictions Precautions Precautions: Fall Restrictions RLE Weight Bearing: Non weight bearing Other Position/Activity Restrictions: NWB R foot; post-op I&D R foot wound     Mobility  Bed Mobility Overal bed mobility: Needs Assistance Bed Mobility: Supine to Sit, Sit to Supine     Supine to sit: Supervision Sit to supine: Supervision        Transfers Overall transfer level: Needs assistance Equipment used:  (knee scooter) Transfers: Sit to/from Stand                  Ambulation/Gait Ambulation/Gait assistance: Supervision Gait Distance (Feet): 240 Feet Assistive device: Knee scooter   Gait velocity: WFL     General Gait Details: Pt able to propel himself with the rolling knee scooter and not have any complications.   Stairs Stairs: Yes Stairs assistance: Contact guard assist Stair Management: Two rails (hop to gait pattern) Number of Stairs: 4 General stair comments: Pt bounces a few times before ascending to the next step.  Pt with good technique, but is slightly impulsive at times.  Pt did place weight through the R LE  when getting off the scooter due to some imbalance he was experiencing.   Wheelchair Mobility     Tilt Bed    Modified Rankin (Stroke Patients Only)       Balance Overall balance assessment: Needs assistance Sitting-balance support: Single extremity supported Sitting balance-Leahy Scale: Good     Standing balance support: Bilateral  upper extremity supported, During functional activity Standing balance-Leahy Scale: Fair                              Cognition Arousal: Alert Behavior During Therapy: WFL for tasks assessed/performed Overall Cognitive Status: Within Functional Limits for tasks assessed                                 General Comments: Pleasant.        Exercises      General Comments        Pertinent Vitals/Pain Pain Assessment Pain Assessment: No/denies pain    Home Living                          Prior Function            PT Goals (current goals can now be found in the care plan section) Acute Rehab PT Goals Patient Stated Goal: to get back to rehab facility PT Goal Formulation: With patient Time For Goal Achievement: 03/04/23 Potential to Achieve Goals: Good Progress towards PT goals: Progressing toward goals    Frequency    Min 1X/week      PT Plan      Co-evaluation              AM-PAC PT "6 Clicks" Mobility   Outcome Measure  Help needed turning from your back to your side while in a flat bed without using bedrails?: A Little Help needed moving from lying on your back to sitting on the side of a flat bed without using bedrails?: A Little Help needed moving to and from a bed to a chair (including a wheelchair)?: A Lot Help needed standing up from a chair using your arms (e.g., wheelchair or bedside chair)?: A Little Help needed to walk in hospital room?: A Lot Help needed climbing 3-5 steps with a railing? : A Lot 6 Click Score: 15    End of Session Equipment Utilized During Treatment: Gait belt Activity Tolerance: Patient tolerated treatment well Patient left: in bed;with call bell/phone within reach;with bed alarm set Nurse Communication: Mobility status;Precautions PT Visit Diagnosis: Unsteadiness on feet (R26.81);Other abnormalities of gait and mobility (R26.89);Muscle weakness (generalized) (M62.81)     Time:  6045-4098 PT Time Calculation (min) (ACUTE ONLY): 17 min  Charges:    $Therapeutic Activity: 8-22 mins PT General Charges $$ ACUTE PT VISIT: 1 Visit                     Nolon Bussing, PT, DPT Physical Therapist - [facility]  02/19/23, 2:13 PM

## 2023-02-19 NOTE — Progress Notes (Signed)
Date of Admission:  02/13/2023      ID: Patrick Downs is a 47 y.o. male  Principal Problem:   Shock circulatory (HCC) Active Problems:   ESRD (end stage renal disease) (HCC)   Hypotension   Peripheral neuropathy   Osteomyelitis of right foot (HCC)   PVD (peripheral vascular disease) (HCC)   Impaired mobility    Subjective: Doing well No dizziness  Medications:   (feeding supplement) PROSource Plus  30 mL Oral TID BM   ascorbic acid  500 mg Oral BID   aspirin EC  81 mg Oral Daily   atorvastatin  40 mg Oral Daily   Chlorhexidine Gluconate Cloth  6 each Topical QHS   clopidogrel  75 mg Oral Daily   heparin  5,000 Units Subcutaneous Q8H   metroNIDAZOLE  500 mg Oral BID   midodrine  5 mg Oral TID WC   multivitamin  1 tablet Oral QHS   nutrition supplement (JUVEN)  1 packet Oral BID BM   predniSONE  50 mg Oral Q breakfast   pregabalin  25 mg Oral Daily   zinc sulfate  220 mg Oral Daily    Objective: Vital signs in last 24 hours: Patient Vitals for the past 24 hrs:  BP Temp Temp src Pulse Resp SpO2 Weight  02/19/23 0844 (!) 105/55 (!) 97.3 F (36.3 C) -- 73 14 98 % --  02/19/23 0440 -- -- -- -- -- -- 125.9 kg  02/18/23 1639 (!) 148/69 97.7 F (36.5 C) Oral 78 18 96 % --      PHYSICAL EXAM:  General: Alert, cooperative, no distress, appears stated age.   Extremities: rt foot picture reviewed   Wound has granulation tissue and is superficial Lab Results    Latest Ref Rng & Units 02/19/2023    6:01 AM 02/18/2023    5:52 AM 02/17/2023    3:51 AM  CBC  WBC 4.0 - 10.5 K/uL 7.7  7.7  7.9   Hemoglobin 13.0 - 17.0 g/dL 16.1  09.6  04.5   Hematocrit 39.0 - 52.0 % 36.7  35.4  39.4   Platelets 150 - 400 K/uL 135  158  146        Latest Ref Rng & Units 02/19/2023    6:01 AM 02/18/2023    5:52 AM 02/17/2023    8:44 AM  CMP  Glucose 70 - 99 mg/dL 90  409  811   BUN 6 - 20 mg/dL 80  58  71   Creatinine 0.61 - 1.24 mg/dL 91.47  8.29  56.21   Sodium 135 - 145  mmol/L 142  138  137   Potassium 3.5 - 5.1 mmol/L 4.8  4.5  4.7   Chloride 98 - 111 mmol/L 100  99  99   CO2 22 - 32 mmol/L 22  23  21    Calcium 8.9 - 10.3 mg/dL 7.6  7.8  7.6       Microbiology: [place] Neg Studies/Results: No results found.   Assessment/Plan: Hypotension-     ESRD    Rt foot osteo on ceftazidime for erysipelothrix Also on flagyl- completing 4 weeks today- DC flagyl , continue ceftazidime 1 gram during dialysis- Tue, Thu, sat for 2 more weeks until 03/05/23   Peripheral neuropathy   Anemia  ?   DM   H/o Guillian barre syndrome with neurological deficit Discussed the management with the patient and hospitalist  [facility]  Diagnosis: Foot infection Chronic osteomyelitis Baseline Creatinine  ESRD  OPAT Orders Discharge antibiotics: Ceftazidime 1 gram on Tuesday ! Gram on thursday 2 grams on Saturday Duration: 2 weeks End Date: 03/05/23  Labs weekly while on IV antibiotics: _X_ CBC with differential   _X_ CMP    Clinic Follow Up Appt: with Dr.Sheliah Fiorillo not required     Call [phone] with any questions or critical values

## 2023-02-19 NOTE — TOC Initial Note (Addendum)
Transition of Care [facility]) - Initial/Assessment Note    Patient Details  Name: Patrick Downs MRN: [id number] Date of Birth: March 17, 1976  [facility]) CM/SW Contact:    Marlowe Sax, RN Phone Number: 02/19/2023, 10:37 AM  Clinical Narrative:                  Met with the patient, he would like to return to [facility], I explained I will see if they can accept him and get ins approval  Reached out to Tammy at [facility], They accepted the patient and ordered the wound vac Ins pending Expected Discharge Plan: Skilled Nursing Facility Barriers to Discharge: Continued Medical Work up   Patient Goals and CMS Choice Patient states their goals for this hospitalization and ongoing recovery are:: SNF          Expected Discharge Plan and Services                                              Prior Living Arrangements/Services   Lives with:: Self Patient language and need for interpreter reviewed:: Yes Do you feel safe going back to the place where you live?: Yes      Need for Family Participation in Patient Care: Yes (Comment) Care giver support system in place?: Yes (comment)   Criminal Activity/Legal Involvement Pertinent to Current Situation/Hospitalization: No - Comment as needed  Activities of Daily Living   ADL Screening (condition at time of admission) Independently performs ADLs?: Yes (appropriate for developmental age) Is the patient deaf or have difficulty hearing?: No Does the patient have difficulty seeing, even when wearing glasses/contacts?: No Does the patient have difficulty concentrating, remembering, or making decisions?: No  Permission Sought/Granted                  Emotional Assessment Appearance:: Appears stated age Attitude/Demeanor/Rapport: Gracious, Engaged Affect (typically observed): Accepting Orientation: : Oriented to Self, Oriented to Place, Oriented to  Time, Oriented to Situation Alcohol / Substance Use: Not  Applicable Psych Involvement: No (comment)  Admission diagnosis:  Shock circulatory (HCC) [R57.9] Hypotension, unspecified hypotension type [I95.9] Patient Active Problem List   Diagnosis Date Noted   Right foot infection 02/16/2023   Shock circulatory (HCC) 02/14/2023   Impaired mobility 02/01/2023   Diabetic foot ulcer (HCC) 01/31/2023   PVD (peripheral vascular disease) (HCC) 01/29/2023   Osteomyelitis of right foot (HCC) 01/27/2023   Ulcer of right foot (HCC) 01/22/2023   Hypocalcemia 07/31/2022   V-tach (HCC) 07/31/2022   Weakness 07/31/2022   Cytopenia 07/31/2022   Troponin I above reference range 07/31/2022   Observed sleep apnea 07/17/2022   Acute bilateral low back pain without sciatica    Fluid overload 09/16/2021   Essential hypertension 09/16/2021   Acute midline low back pain without sciatica 09/16/2021   Hip pain, chronic, left 12/24/2020   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) 05/16/2020   Cellulitis 05/14/2020   Ulcer of great toe, right, with fat layer exposed (HCC) 02/13/2020   COVID-19 01/28/2020   Acute hypoxemic respiratory failure due to COVID-19 (HCC) 01/25/2020   Sepsis (HCC) 01/25/2020   Thrombocytopenia (HCC) 01/25/2020   Elevated troponin 01/25/2020   Peripheral neuropathy 01/25/2020   Other disorders of phosphorus metabolism 11/27/2019   Sepsis without acute organ dysfunction (HCC) 08/10/2019   Hypercalcemia 05/15/2019   Murmur 02/19/2019  GERD (gastroesophageal reflux disease) 03/01/2018   Degenerative disc disease, lumbar 01/09/2018   HTN (hypertension) 12/28/2017   Hyperkalemia 08/16/2017   Cellulitis of right toe 04/25/2017   Erectile dysfunction due to diseases classified elsewhere 05/31/2016   Awaiting organ transplant status 08/23/2015   Polyneuropathy, unspecified 08/02/2015   Anemia of chronic disease 08/02/2015   Gastroenteritis 08/02/2015   Heart failure, unspecified (HCC) 08/02/2015   Secondary hyperparathyroidism of renal  origin (HCC) 08/02/2015   Bacterial infection, unspecified 06/29/2015   Unspecified open wound of right upper arm, initial encounter 06/29/2015   Encounter for fitting of gastrointestinal device 06/04/2015   Other specified local infections of the skin and subcutaneous tissue 05/20/2015   Idiopathic chronic hypotension 12/11/2014   Rectal pain 11/27/2014   Other specified diseases of anus and rectum 11/27/2014   Unspecified protein-calorie malnutrition (HCC) 10/08/2014   Encounter for imaging study to confirm nasogastric (NG) tube placement    Encounter for nasogastric (NG) tube placement    Respiratory failure, acute (HCC)    Guillain Barr syndrome, lower ext weakness 08/27/2014   CAP (community acquired pneumonia) 08/23/2014   ESRD (end stage renal disease) (HCC) 08/23/2014   A-V fistula (HCC) 08/23/2014   ARDS (adult respiratory distress syndrome) (HCC) 08/23/2014   Septic shock (HCC) 08/23/2014   Hypotension 08/23/2014   Acute respiratory failure (HCC) 08/23/2014   Fever    Type 2 diabetes mellitus (HCC) 07/01/2014   Controlled type 2 diabetes mellitus with neuropathy (HCC) 07/01/2014   Headache, unspecified 05/23/2014   Pruritus, unspecified 05/23/2014   Shortness of breath 05/23/2014   Iron deficiency anemia, unspecified 03/16/2014   Complication of vascular dialysis catheter 01/08/2014   Diarrhea, unspecified 01/08/2014   Pain, unspecified 01/08/2014   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, implants and grafts, sequela 01/08/2014   Anxiety disorder, unspecified 12/28/2013   Photophobia 12/11/2013   Spells 10/30/2013   Dizziness 10/30/2013   Other disorders of electrolyte and fluid balance, not elsewhere classified 09/16/2013   Other ulcerative colitis without complications (HCC) 06/11/2013   Coagulation defect, unspecified (HCC) 04/21/2013   Other allergy, initial encounter 04/21/2013   Other mechanical complication of surgically created arteriovenous fistula,  initial encounter (HCC) 02/19/2013   Foot drop 12/09/2012   History of Guillain-Barre syndrome 12/09/2012   Other specified disorders of parathyroid gland (HCC) 05/15/2012   Dependence on renal dialysis (HCC) 11/29/2010   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   PCP:  Mick Sell, MD Pharmacy:   [facility] [phone] [place] [address], [place] - [address] AT [facility] OF [address] [place] [address] [place] [address] Phone: [phone] Fax: [phone]     Social Determinants of Health (SDOH) Social History: SDOH Screenings   Food Insecurity: No Food Insecurity (02/14/2023)  Housing: Patient Declined (02/14/2023)  Transportation Needs: No Transportation Needs (02/14/2023)  Utilities: Not At Risk (02/14/2023)  Depression (PHQ2-9): Low Risk  (06/27/2021)  Financial Resource Strain: Low Risk  (09/11/2022)   Received from [facility], [facility]  Social Connections: Unknown (09/11/2022)   Received from [facility], [facility]  Stress: No Stress Concern Present (09/11/2022)   Received from [facility], [facility]  Tobacco Use: Low Risk  (02/19/2023)   SDOH Interventions:     Readmission Risk Interventions    02/15/2023    3:45 PM 01/22/2023   10:18 AM  Readmission Risk Prevention  Plan  Transportation Screening Complete Complete  PCP or Specialist Appt within 3-5 Days Complete Complete  HRI or Home Care Consult Complete Complete  Social Work Consult for Recovery Care Planning/Counseling Complete Patient refused  Palliative Care Screening Not Applicable Not Applicable  Medication Review Oceanographer) Complete Complete

## 2023-02-19 NOTE — Plan of Care (Signed)
  Problem: Education: Goal: Knowledge of General Education information will improve Description: Including pain rating scale, medication(s)/side effects and non-pharmacologic comfort measures Outcome: Progressing   Problem: Coping: Goal: Level of anxiety will decrease Outcome: Progressing   Problem: Activity: Goal: Risk for activity intolerance will decrease Outcome: Progressing   Problem: Pain Management: Goal: General experience of comfort will improve Outcome: Progressing   Problem: Skin Integrity: Goal: Risk for impaired skin integrity will decrease Outcome: Progressing

## 2023-02-19 NOTE — Care Management Important Message (Signed)
Important Message  Patient Details  Name: Patrick Downs MRN: [id number] Date of Birth: Oct 28, 1975   Important Message Given:  Yes - Medicare IM     Olegario Messier A Derin Granquist 02/19/2023, 3:07 PM

## 2023-02-19 NOTE — Progress Notes (Unsigned)
Virtual Visit via Telephone Note  I connected with Patrick Downs on 02/19/23 at  1:00 PM EDT by telephone and verified that I am speaking with the correct person using two identifiers.  Location: Patient: Hospital Provider: Pain control center   I discussed the limitations, risks, security and privacy concerns of performing an evaluation and management service by telephone and the availability of in person appointments. I also discussed with the patient that there may be a patient responsible charge related to this service. The patient expressed understanding and agreed to proceed.   History of Present Illness:  I spoke with Patrick Downs via telephone for his virtual visit.  He was unable to do the video portion of this.  Unfortunately he is currently in the hospital secondary to some blood pressure management issues.  He is due to be discharged tomorrow.  In regards to his low back pain and lower extremity pain the quality characteristic and distribution of this remained stable.  He continues to take his medications as prescribed and these continue to work well for him.  He did recently have to have some lower extremity surgery for which she did receive some acute pain care Percocet from his surgeon.  In review of the [facility] practitioner database information there is no other irregularity noted.  He has been compliant with his medication.  He denies any diverting or illicit use.  He continues to get about 80% relief for 4 to 6 hours following administration of the Percocet.  No side effects are reported.  Otherwise the quality of the pain is stable with no recent changes.  He continues to try and do his stretching strengthening exercises as best he can.  Review of systems: General: No fevers or chills Pulmonary: No shortness of breath or dyspnea Cardiac: No angina or palpitations or lightheadedness GI: No abdominal pain or constipation Psych: No  depression  Observations/Objective:  No current facility-administered medications for this visit.  Current Outpatient Medications:    [START ON 02/21/2023] oxyCODONE-acetaminophen (PERCOCET) 10-325 MG tablet, Take 1 tablet by mouth every 4 (four) hours as needed for pain., Disp: 135 tablet, Rfl: 0   [START ON 03/23/2023] oxyCODONE-acetaminophen (PERCOCET) 10-325 MG tablet, Take 1 tablet by mouth every 4 (four) hours as needed for pain., Disp: 135 tablet, Rfl: 0  Facility-Administered Medications Ordered in Other Visits:    (feeding supplement) PROSource Plus liquid 30 mL, 30 mL, Oral, TID BM, Amin, Sumayya, MD, 30 mL at 02/19/23 1610   ascorbic acid (VITAMIN C) tablet 500 mg, 500 mg, Oral, BID, Rust-Chester, Britton L, NP, 500 mg at 02/19/23 9604   aspirin EC tablet 81 mg, 81 mg, Oral, Daily, Rust-Chester, Britton L, NP, 81 mg at 02/19/23 5409   atorvastatin (LIPITOR) tablet 40 mg, 40 mg, Oral, Daily, Rust-Chester, Britton L, NP, 40 mg at 02/19/23 8119   cefTAZidime (FORTAZ) 1 g in sodium chloride 0.9 % 100 mL IVPB, 1 g, Intravenous, Q T,Th,Sa-HD, Rust-Chester, Cecelia Byars, NP, Stopped at 02/17/23 1219   Chlorhexidine Gluconate Cloth 2 % PADS 6 each, 6 each, Topical, QHS, Rust-Chester, Britton L, NP, 6 each at 02/19/23 0041   clopidogrel (PLAVIX) tablet 75 mg, 75 mg, Oral, Daily, Rust-Chester, Britton L, NP, 75 mg at 02/19/23 0924   docusate sodium (COLACE) capsule 100 mg, 100 mg, Oral, BID PRN, Rust-Chester, Micheline Rough L, NP   heparin injection 5,000 Units, 5,000 Units, Subcutaneous, Q8H, Rust-Chester, Britton L, NP, 5,000 Units at 02/18/23 2216   heparin sodium (porcine)  injection 1,000 Units, 1,000 Units, Intravenous, PRN, Wendee Beavers, NP, 1,000 Units at 02/15/23 1301   hydrOXYzine (ATARAX) tablet 25 mg, 25 mg, Oral, BID PRN, Harlon Ditty D, NP, 25 mg at 02/18/23 2221   metroNIDAZOLE (FLAGYL) tablet 500 mg, 500 mg, Oral, BID, Rust-Chester, Micheline Rough L, NP, 500 mg at 02/19/23 6295    midodrine (PROAMATINE) tablet 5 mg, 5 mg, Oral, TID WC, Arnetha Courser, MD, 5 mg at 02/19/23 2841   multivitamin (RENA-VIT) tablet 1 tablet, 1 tablet, Oral, QHS, Arnetha Courser, MD, 1 tablet at 02/18/23 2221   nutrition supplement (JUVEN) (JUVEN) powder packet 1 packet, 1 packet, Oral, BID BM, Arnetha Courser, MD, 1 packet at 02/18/23 1303   Oral care mouth rinse, 15 mL, Mouth Rinse, PRN, Rust-Chester, Cecelia Byars, NP   oxyCODONE-acetaminophen (PERCOCET/ROXICET) 5-325 MG per tablet 1-2 tablet, 1-2 tablet, Oral, Q4H PRN, Rust-Chester, Cecelia Byars, NP, 2 tablet at 02/18/23 1851   polyethylene glycol (MIRALAX / GLYCOLAX) packet 17 g, 17 g, Oral, Daily PRN, Rust-Chester, Micheline Rough L, NP   predniSONE (DELTASONE) tablet 50 mg, 50 mg, Oral, Q breakfast, Arnetha Courser, MD, 50 mg at 02/19/23 3244   pregabalin (LYRICA) capsule 25 mg, 25 mg, Oral, Daily, Rust-Chester, Micheline Rough L, NP, 25 mg at 02/19/23 0102   zinc sulfate capsule 220 mg, 220 mg, Oral, Daily, Rust-Chester, Cecelia Byars, NP, 220 mg at 02/19/23 7253  Past Medical History:  Diagnosis Date   CHF (congestive heart failure) (HCC)    Degenerative disc disease, lumbar    Depression    Dialysis patient (HCC)    ESRD (end stage renal disease) on dialysis (HCC)    Failure to thrive in adult    GERD (gastroesophageal reflux disease)    Gout    Guillain Barr syndrome (HCC)    Guillain Barr syndrome (HCC)    Heart murmur    Hip pain, chronic, left 12/24/2020   HTN (hypertension)    Hyperparathyroidism (HCC)    Kidney failure    MRSA (methicillin resistant staph aureus) culture positive    Peripheral neuropathy    Pneumonia    Renal insufficiency    Respiratory failure (HCC)    Assessment and Plan:  1. Chronic pain syndrome   2. Chronic, continuous use of opioids   3. Facet arthritis of lumbar region   4. Hip pain, chronic, left   5. Guillain-Barre syndrome (HCC)   6. Weakness of both lower extremities   7. ESRD on dialysis (HCC)   8. Chronic  bilateral low back pain without sciatica    Based our conversation I think it is appropriate to refill his medicines dated for October 30 and November 29.  No other changes in his regimen will be initiated.  I encouraged him to continue with stretching strengthening exercises as tolerated.  Hopefully the physicians can get his blood pressure under better control with his history of end-stage renal disease.  Will schedule him for 13-month return to clinic and continue follow-up with his primary care physicians for baseline medical care treatment. Follow Up Instructions:    I discussed the assessment and treatment plan with the patient. The patient was provided an opportunity to ask questions and all were answered. The patient agreed with the plan and demonstrated an understanding of the instructions.   The patient was advised to call back or seek an in-person evaluation if the symptoms worsen or if the condition fails to improve as anticipated.  I provided 30 minutes of non-face-to-face time during this encounter.  Yevette Edwards, MD

## 2023-02-19 NOTE — Progress Notes (Signed)
Progress Note   Patient: Patrick Downs ZOX:[id number] DOB: 1976-04-02 DOA: 02/13/2023     5 DOS: the patient was seen and examined on 02/19/2023   Brief hospital course: ICU transfer for 02/17/2023.  Taken from prior notes.  47 year old male with history of ESRD on hemodialysis presenting to the hospital with hypotension in the setting of hemodialysis requiring peripheral vasopressors.  Etiology of shock concerning for hypovolemia superimposed on Sildenafil along with adrenal insufficiency.   Patient was at rehab for assistance due to osteomyelitis and right foot requiring debridement, wound VAC and no weightbearing.  He is on ceftazidime with dialysis.  When EMS was called patient has systolic blood pressure in 50s.  Initially admitted in ICU as requiring IV pressors.  Labs and lactic acid was unremarkable.  Renal function at baseline.  Patient was started on stress doses of steroid, later weaned off from Levophed and midodrine dose was increased to 15 mg 3 times daily.  Patient also has an history of chronic hypertension alternating with hypotension requiring midodrine in the past but recently blood pressure was stable off the midodrine.  Echocardiogram done on 02/14/2023 with normal EF, grade 1 diastolic dysfunction and no other significant abnormality.  10/26: Patient with some intermittent mild hypotension during dialysis today, no symptoms.  Remained on high dose of Solu-Cortef with 100 mg Q8 hourly-started tapering. Remained on ceftazidime with dialysis for wound infection. PT/OT evaluation ordered.  As patient was recently discharged to SNF for rehab.  10/27: Hemodynamically stable, PT is recommending CIR but unfortunately insurance will not pay and patient will go back to his prior skilled nursing facility for further rehab. Blood pressure started trending up, decreasing midodrine to 5 mg with holding parameters. Will start on prednisone from tomorrow.  10/28:  Hemodynamically stable with mildly elevated blood pressure not requiring midodrine currently.  Pending repeat insurance authorization to [facility].  ID is recommending continuation of 2 more weeks of ceftazidime with dialysis.  Assessment and Plan: * Shock circulatory [facility]) Patient presented with significant hypotension requiring pressors.  Likely multifactorial due to dialysis, using sildenafil and there was a concern of adrenal insufficiency. Now maintaining blood pressure although intermittently becoming soft on midodrine. He was also placed on stress doses of steroid -Decreasing the dose of midodrine to 5 mg 3 times daily with holding parameter -Solu-Cortef is being switched with prednisone from tomorrow -Monitor blood pressure -Encourage p.o. hydration  Osteomyelitis of right foot (HCC) S/p debridement and wound VAC in place Patient was recently discharged to [facility] with wound VAC and to continue ceftazidime with dialysis and twice daily Flagyl until 10/28. -ID is on board and recommending 2 more week of Ceftazidime with dialysis   Hypotension Blood pressure started trending up.  History of hypo and hypertension. -Decreasing the dose of midodrine to 5 mg 3 times daily with holding parameter -Started prednisone today for next 3 days  Peripheral neuropathy Chronic pain. History of Guillain-Barr. -Continue home Lyrica and Percocet  ESRD (end stage renal disease) (HCC) Getting dialysis today. -Nephrology is on board-will continue routine dialysis  PVD (peripheral vascular disease) (HCC) S/p PCI by vascular surgery on 01/24/2023. -Continue with aspirin and Plavix -Continue with statin  Impaired mobility Patient was recently discharged to [facility] for rehab. -PT and OT evaluation   Subjective: Patient was eating breakfast when seen today.  No new concern.  Physical Exam: Vitals:   02/18/23 1639 02/19/23 0440 02/19/23 0844 02/19/23 1532  BP: (!) 148/69   Marland Kitchen)  105/55 138/69  Pulse: 78  73 87  Resp: 18  14 14   Temp: 97.7 F (36.5 C)  (!) 97.3 F (36.3 C) 98.5 F (36.9 C)  TempSrc: Oral     SpO2: 96%  98% 100%  Weight:  125.9 kg    Height:       General.  Obese gentleman, in no acute distress. Pulmonary.  Lungs clear bilaterally, normal respiratory effort. CV.  Regular rate and rhythm, no JVD, rub or murmur. Abdomen.  Soft, nontender, nondistended, BS positive. CNS.  Alert and oriented .  No focal neurologic deficit. Extremities.  No edema, right foot with bandage Psychiatry.  Judgment and insight appears normal.   Data Reviewed: Prior data reviewed  Family Communication: Discussed with patient, a lady friend at bedside.  Disposition: Status is: Inpatient Remains inpatient appropriate because: Severity of illness  Planned Discharge Destination: SNF  DVT prophylaxis.  Subcu heparin Time spent: 40 minutes  This record has been created using Conservation officer, historic buildings. Errors have been sought and corrected,but may not always be located. Such creation errors do not reflect on the standard of care.   Author: Arnetha Courser, MD 02/19/2023 3:43 PM  For on call review [email or web address].

## 2023-02-19 NOTE — Plan of Care (Signed)
Progressing towards set goals.

## 2023-02-19 NOTE — Progress Notes (Signed)
Patient sleeping. Refused midnight VS being taken.

## 2023-02-19 NOTE — Consult Note (Signed)
WOC Nurse wound follow up Wound type: Right lateral foot neuropathic ulcer with NPWT in place.  Measurement: 4 cm x 4 cm x 0.6 cm  Wound UXL:KGMWN red  Drainage (amount, consistency, odor) minimal serosanguinous  no odor  Periwound: maceration to periwound, protected with barrier ring Right heel was reported to patient as boggy.  He is requesting skin prep to heel.  The area is cleaned, skin prep applied and new foam dressing applied.  Foot in prevalon boot Dressing procedure/placement/frequency: old dressing removed. Wound cleansed. Barrier ring to periwound.  1 piece black foam to wound bed.  Covered with drape.  Seal achieved at 125 mmHg.  Change MOnday and Thursday. Photo placed in chart.   Will  Follow.   Mike Gip MSN, RN, FNP-BC CWON Wound, Ostomy, Continence Nurse Outpatient [facility] [phone] Pager [phone]

## 2023-02-19 NOTE — Progress Notes (Signed)
[facility]  ROUNDING NOTE   Subjective:   [name] was admitted to [facility] on 02/13/2023 for Shock circulatory [facility]) [R57.9] Hypotension, unspecified hypotension type [I95.9]  Patient is known to our practice and receives outpatient dialysis at [facility] on a TTS schedule, supervised by [facility] physicians.   Update: Patient sitting up in bed Family at bedside Right foot dressing change recently performed   Objective:  Vital signs in last 24 hours:  Temp:  [97.3 F (36.3 C)-97.7 F (36.5 C)] 97.3 F (36.3 C) (10/28 0844) Pulse Rate:  [73-78] 73 (10/28 0844) Resp:  [14-18] 14 (10/28 0844) BP: (105-148)/(55-69) 105/55 (10/28 0844) SpO2:  [96 %-98 %] 98 % (10/28 0844) Weight:  [125.9 kg] 125.9 kg (10/28 0440)  Weight change: -0.084 kg Filed Weights   02/17/23 1250 02/18/23 0706 02/19/23 0440  Weight: 118.7 kg 118.6 kg 125.9 kg    Intake/Output: I/O last 3 completed shifts: In: 480 [P.O.:480] Out: 0    Intake/Output this shift:  Total I/O In: 240 [P.O.:240] Out: -   Physical Exam: General: No acute distress  Head: Normocephalic, atraumatic. Moist oral mucosal membranes  Lungs:  Clear to auscultation, normal effort  Heart: S1S2 no rubs  Abdomen:  Soft, nontender, bowel sounds present  Extremities: No peripheral edema.  Neurologic: Awake, alert, following commands  Skin: No acute rash, RLE wound (NPWV)  Access: Right IJ PermCath    Basic Metabolic Panel: Recent Labs  Lab 02/14/23 0558 02/15/23 0328 02/16/23 0542 02/17/23 0351 02/17/23 0844 02/18/23 0552 02/19/23 0601  NA 136 139 135 137 137 138 142  K 3.8 4.6 3.9 6.4* 4.7 4.5 4.8  CL 100 103 98 101 99 99 100  CO2 21* 20* 24 19* 21* 23 22  GLUCOSE 146* 105* 146* 120* 165* 164* 90  BUN 43* 57* 47* 64* 71* 58* 80*  CREATININE 9.19* 11.37* 10.02* 12.29* 13.75* 9.64* 11.68*  CALCIUM 8.0* 8.5* 7.8* 8.0* 7.6* 7.8* 7.6*  MG 2.0 2.3 1.9 2.1  --   --   --   PHOS 3.3 4.0 3.6 2.6  2.1* 3.5 6.4*    Liver Function Tests: Recent Labs  Lab 02/13/23 1624 02/15/23 0328 02/17/23 0351 02/17/23 0844 02/18/23 0552 02/19/23 0601  AST 27  --   --   --   --   --   ALT 30  --   --   --   --   --   ALKPHOS 82  --   --   --   --   --   BILITOT 0.7  --   --   --   --   --   PROT 8.7*  --   --   --   --   --   ALBUMIN 3.9 3.4* 3.8 3.3* 3.8 4.0   No results for input(s): "LIPASE", "AMYLASE" in the last 168 hours. No results for input(s): "AMMONIA" in the last 168 hours.  CBC: Recent Labs  Lab 02/13/23 1624 02/14/23 0558 02/15/23 0328 02/16/23 0542 02/17/23 0351 02/18/23 0552 02/19/23 0601  WBC 9.1   < > 7.8 5.7 7.9 7.7 7.7  NEUTROABS 6.7  --   --   --   --   --   --   HGB 12.2*   < > 11.5* 10.9* 12.6* 11.5* 11.7*  HCT 38.7*   < > 36.0* 33.3* 39.4 35.4* 36.7*  MCV 91.9   < > 90.2 87.9 90.4 88.9 90.0  PLT 118*   < >  148* 126* 146* 158 135*   < > = values in this interval not displayed.    Cardiac Enzymes: No results for input(s): "CKTOTAL", "CKMB", "CKMBINDEX", "TROPONINI" in the last 168 hours.  BNP: Invalid input(s): "POCBNP"  CBG: Recent Labs  Lab 02/14/23 0235  GLUCAP 93     Microbiology: Results for orders placed or performed during the hospital encounter of 02/13/23  MRSA Next Gen by PCR, Nasal     Status: None   Collection Time: 02/14/23  2:15 AM   Specimen: Nasal Mucosa; Nasal Swab  Result Value Ref Range Status   MRSA by PCR Next Gen NOT DETECTED NOT DETECTED Final    Comment: (NOTE) The GeneXpert MRSA Assay (FDA approved for NASAL specimens only), is one component of a comprehensive MRSA colonization surveillance program. It is not intended to diagnose MRSA infection nor to guide or monitor treatment for MRSA infections. Test performance is not FDA approved in patients less than 104 years old. Performed at [facility], [address] [place], [place] [address]     Coagulation Studies: No results for input(s): "LABPROT",  "INR" in the last 72 hours.   Urinalysis: No results for input(s): "COLORURINE", "LABSPEC", "PHURINE", "GLUCOSEU", "HGBUR", "BILIRUBINUR", "KETONESUR", "PROTEINUR", "UROBILINOGEN", "NITRITE", "LEUKOCYTESUR" in the last 72 hours.  Invalid input(s): "APPERANCEUR"    Imaging: No results found.   Medications:    cefTAZidime (FORTAZ)  IV Stopped (02/17/23 1219)    (feeding supplement) PROSource Plus  30 mL Oral TID BM   ascorbic acid  500 mg Oral BID   aspirin EC  81 mg Oral Daily   atorvastatin  40 mg Oral Daily   Chlorhexidine Gluconate Cloth  6 each Topical QHS   clopidogrel  75 mg Oral Daily   heparin  5,000 Units Subcutaneous Q8H   metroNIDAZOLE  500 mg Oral BID   midodrine  5 mg Oral TID WC   multivitamin  1 tablet Oral QHS   nutrition supplement (JUVEN)  1 packet Oral BID BM   predniSONE  50 mg Oral Q breakfast   pregabalin  25 mg Oral Daily   zinc sulfate  220 mg Oral Daily   docusate sodium, heparin sodium (porcine), hydrOXYzine, mouth rinse, oxyCODONE-acetaminophen, polyethylene glycol  Assessment/ Plan:  47 y.o. male ESRD on HD TTS, CHF, history of Guillain-Barr syndrome, hypertension, anemia of chronic kidney disease, secondary hyperparathyroidism, history of right foot toe amputation who presents with ulceration on lateral aspect of right foot.  [facility] [address]  1.  ESRD on HD TTS.  Next treatment scheduled for Tuesday.   2.  Anemia of chronic kidney disease.   Lab Results  Component Value Date   HGB 11.7 (L) 02/19/2023   Patient receives Mircera as outpatient.  Hemoglobin within desired range.   3.  Secondary hyperparathyroidism with outpatient labs: PTH 204, calcium 6.9, and phos 7.6 on 02/06/23  - Calcium 7.6, will order 3 calcium bath with dialysis  4.  Diabetes mellitus type II with chronic kidney disease and complication of right foot osteomyelitis:  -Completed ceftazidime per ID recommendations  5. Hypotension, believed due to  hypovolemia and sedation. Placed in ICU with Levophed drip and midodrine.   Continue Midodrine   LOS: 5 Harrell Niehoff 10/28/202410:44 AM

## 2023-02-19 NOTE — [place] FL2 (Signed)
[place] MEDICAID FL2 LEVEL OF CARE FORM     IDENTIFICATION  Patient Name: Patrick Downs Birthdate: 25-Aug-1975 Sex: male Admission Date (Current Location): 02/13/2023  [facility] and [place] Number:  Chiropodist and Address:  [facility], [address] [place], [place] [address]      Provider Number: [id number]  Attending Physician Name and Address:  Arnetha Courser, MD  Relative Name and Phone Number:  Orvan Seen Mother [phone]    Current Level of Care: Hospital Recommended Level of Care: Skilled Nursing Facility Prior Approval Number:    Date Approved/Denied:   PASRR Number: [id number]  Discharge Plan: SNF    Current Diagnoses: Patient Active Problem List   Diagnosis Date Noted   Right foot infection 02/16/2023   Shock circulatory (HCC) 02/14/2023   Impaired mobility 02/01/2023   Diabetic foot ulcer (HCC) 01/31/2023   PVD (peripheral vascular disease) (HCC) 01/29/2023   Osteomyelitis of right foot (HCC) 01/27/2023   Ulcer of right foot (HCC) 01/22/2023   Hypocalcemia 07/31/2022   V-tach (HCC) 07/31/2022   Weakness 07/31/2022   Cytopenia 07/31/2022   Troponin I above reference range 07/31/2022   Observed sleep apnea 07/17/2022   Acute bilateral low back pain without sciatica    Fluid overload 09/16/2021   Essential hypertension 09/16/2021   Acute midline low back pain without sciatica 09/16/2021   Hip pain, chronic, left 12/24/2020   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) 05/16/2020   Cellulitis 05/14/2020   Ulcer of great toe, right, with fat layer exposed (HCC) 02/13/2020   COVID-19 01/28/2020   Acute hypoxemic respiratory failure due to COVID-19 (HCC) 01/25/2020   Sepsis (HCC) 01/25/2020   Thrombocytopenia (HCC) 01/25/2020   Elevated troponin 01/25/2020   Peripheral neuropathy 01/25/2020   Other disorders of phosphorus metabolism 11/27/2019   Sepsis without acute organ dysfunction (HCC) 08/10/2019    Hypercalcemia 05/15/2019   Murmur 02/19/2019   GERD (gastroesophageal reflux disease) 03/01/2018   Degenerative disc disease, lumbar 01/09/2018   HTN (hypertension) 12/28/2017   Hyperkalemia 08/16/2017   Cellulitis of right toe 04/25/2017   Erectile dysfunction due to diseases classified elsewhere 05/31/2016   Awaiting organ transplant status 08/23/2015   Polyneuropathy, unspecified 08/02/2015   Anemia of chronic disease 08/02/2015   Gastroenteritis 08/02/2015   Heart failure, unspecified (HCC) 08/02/2015   Secondary hyperparathyroidism of renal origin (HCC) 08/02/2015   Bacterial infection, unspecified 06/29/2015   Unspecified open wound of right upper arm, initial encounter 06/29/2015   Encounter for fitting of gastrointestinal device 06/04/2015   Other specified local infections of the skin and subcutaneous tissue 05/20/2015   Idiopathic chronic hypotension 12/11/2014   Rectal pain 11/27/2014   Other specified diseases of anus and rectum 11/27/2014   Unspecified protein-calorie malnutrition (HCC) 10/08/2014   Encounter for imaging study to confirm nasogastric (NG) tube placement    Encounter for nasogastric (NG) tube placement    Respiratory failure, acute (HCC)    Guillain Barr syndrome, lower ext weakness 08/27/2014   CAP (community acquired pneumonia) 08/23/2014   ESRD (end stage renal disease) (HCC) 08/23/2014   A-V fistula (HCC) 08/23/2014   ARDS (adult respiratory distress syndrome) (HCC) 08/23/2014   Septic shock (HCC) 08/23/2014   Hypotension 08/23/2014   Acute respiratory failure (HCC) 08/23/2014   Fever    Type 2 diabetes mellitus (HCC) 07/01/2014   Controlled type 2 diabetes mellitus with neuropathy (HCC) 07/01/2014   Headache, unspecified 05/23/2014   Pruritus, unspecified 05/23/2014   Shortness  of breath 05/23/2014   Iron deficiency anemia, unspecified 03/16/2014   Complication of vascular dialysis catheter 01/08/2014   Diarrhea, unspecified 01/08/2014    Pain, unspecified 01/08/2014   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, implants and grafts, sequela 01/08/2014   Anxiety disorder, unspecified 12/28/2013   Photophobia 12/11/2013   Spells 10/30/2013   Dizziness 10/30/2013   Other disorders of electrolyte and fluid balance, not elsewhere classified 09/16/2013   Other ulcerative colitis without complications (HCC) 06/11/2013   Coagulation defect, unspecified (HCC) 04/21/2013   Other allergy, initial encounter 04/21/2013   Other mechanical complication of surgically created arteriovenous fistula, initial encounter (HCC) 02/19/2013   Foot drop 12/09/2012   History of Guillain-Barre syndrome 12/09/2012   Other specified disorders of parathyroid gland (HCC) 05/15/2012   Dependence on renal dialysis (HCC) 11/29/2010   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    Orientation RESPIRATION BLADDER Height & Weight     Self, Time, Situation, Place  Normal Continent Weight: 125.9 kg (SCD machine was added to the bed.) Height:  6\' 2"  (188 cm)  BEHAVIORAL SYMPTOMS/MOOD NEUROLOGICAL BOWEL NUTRITION STATUS      Continent Diet (See DC summary)  AMBULATORY STATUS COMMUNICATION OF NEEDS Skin   Limited Assist Verbally Wound Vac                       Personal Care Assistance Level of Assistance  Bathing, Feeding, Dressing Bathing Assistance: Limited assistance Feeding assistance: Independent Dressing Assistance: Limited assistance     Functional Limitations Info  Sight, Hearing, Speech Sight Info: Adequate Hearing Info: Adequate Speech Info: Adequate    SPECIAL CARE FACTORS FREQUENCY  PT (By licensed PT), OT (By licensed OT)     PT Frequency: 5 times per week OT Frequency: 5 Times per week            Contractures Contractures Info: Not present    Additional Factors Info  Code Status, Allergies Code Status Info: Full code Allergies Info: Hepatitis B Vaccine,  Ondansetron, Minoxidil, Morphine And Codeine, Omnipaque (Iohexol)           Current Medications (02/19/2023):  This is the current hospital active medication list Current Facility-Administered Medications  Medication Dose Route Frequency Provider Last Rate Last Admin   (feeding supplement) PROSource Plus liquid 30 mL  30 mL Oral TID BM Arnetha Courser, MD   30 mL at 02/19/23 1610   ascorbic acid (VITAMIN C) tablet 500 mg  500 mg Oral BID Rust-Chester, Britton L, NP   500 mg at 02/19/23 9604   aspirin EC tablet 81 mg  81 mg Oral Daily Rust-Chester, Britton L, NP   81 mg at 02/19/23 5409   atorvastatin (LIPITOR) tablet 40 mg  40 mg Oral Daily Rust-Chester, Britton L, NP   40 mg at 02/19/23 8119   cefTAZidime (FORTAZ) 1 g in sodium chloride 0.9 % 100 mL IVPB  1 g Intravenous Q T,Th,Sa-HD Rust-Chester, Cecelia Byars, NP   Stopped at 02/17/23 1219   Chlorhexidine Gluconate Cloth 2 % PADS 6 each  6 each Topical QHS Rust-Chester, Britton L, NP   6 each at 02/19/23 0041   clopidogrel (PLAVIX) tablet 75 mg  75 mg Oral Daily Rust-Chester, Britton L, NP   75 mg at 02/19/23 0924   docusate sodium (COLACE) capsule 100 mg  100 mg Oral BID PRN Rust-Chester, Micheline Rough L, NP       heparin injection 5,000 Units  5,000 Units Subcutaneous Q8H  Rust-Chester, Cecelia Byars, NP   5,000 Units at 02/18/23 2216   heparin sodium (porcine) injection 1,000 Units  1,000 Units Intravenous PRN Wendee Beavers, NP   1,000 Units at 02/15/23 1301   hydrOXYzine (ATARAX) tablet 25 mg  25 mg Oral BID PRN Judithe Modest, NP   25 mg at 02/18/23 2221   metroNIDAZOLE (FLAGYL) tablet 500 mg  500 mg Oral BID Rust-Chester, Britton L, NP   500 mg at 02/19/23 7425   midodrine (PROAMATINE) tablet 5 mg  5 mg Oral TID WC Arnetha Courser, MD   5 mg at 02/19/23 9563   multivitamin (RENA-VIT) tablet 1 tablet  1 tablet Oral QHS Arnetha Courser, MD   1 tablet at 02/18/23 2221   nutrition supplement (JUVEN) (JUVEN) powder packet 1 packet  1 packet Oral BID BM  Arnetha Courser, MD   1 packet at 02/18/23 1303   Oral care mouth rinse  15 mL Mouth Rinse PRN Rust-Chester, Cecelia Byars, NP       oxyCODONE-acetaminophen (PERCOCET/ROXICET) 5-325 MG per tablet 1-2 tablet  1-2 tablet Oral Q4H PRN Rust-Chester, Cecelia Byars, NP   2 tablet at 02/18/23 1851   polyethylene glycol (MIRALAX / GLYCOLAX) packet 17 g  17 g Oral Daily PRN Rust-Chester, Micheline Rough L, NP       predniSONE (DELTASONE) tablet 50 mg  50 mg Oral Q breakfast Arnetha Courser, MD   50 mg at 02/19/23 8756   pregabalin (LYRICA) capsule 25 mg  25 mg Oral Daily Rust-Chester, Britton L, NP   25 mg at 02/19/23 4332   zinc sulfate capsule 220 mg  220 mg Oral Daily Rust-Chester, Cecelia Byars, NP   220 mg at 02/19/23 9518     Discharge Medications: Please see discharge summary for a list of discharge medications.  Relevant Imaging Results:  Relevant Lab Results:   Additional Information SSN # [id number]  Marlowe Sax, RN

## 2023-02-19 NOTE — Consult Note (Signed)
PHARMACY CONSULT NOTE - ELECTROLYTES  Pharmacy Consult for Electrolyte Monitoring and Replacement   Recent Labs: Height: 6\' 2"  (188 cm) Weight: 125.9 kg (277 lb 9 oz) (SCD machine was added to the bed.) IBW/kg (Calculated) : 82.2 Estimated Creatinine Clearance: 11.1 mL/min (A) (by C-G formula based on SCr of 11.68 mg/dL (H)).  Potassium (mmol/L)  Date Value  02/19/2023 4.8  08/22/2014 5.1   Magnesium (mg/dL)  Date Value  16/01/9603 2.1  08/22/2014 2.3   Calcium (mg/dL)  Date Value  54/12/8117 7.6 (L)   Calcium, Total (mg/dL)  Date Value  14/78/2956 7.1 (L)   Albumin (g/dL)  Date Value  21/30/8657 4.0  08/19/2014 2.1 (L)   Phosphorus (mg/dL)  Date Value  84/69/6295 6.4 (H)  08/22/2014 6.0 (H)   Sodium (mmol/L)  Date Value  02/19/2023 142  08/22/2014 136   Corrected Ca: 8.28 mg/dL  Assessment  Patrick Downs is a 47 y.o. male presenting with hypotension after dialysis session. PMH significant for HFpEF, ESRD-HD (TTS), HTN, hyperparathyroidism, and Guillain-Barr syndrome. Pharmacy has been consulted to monitor and replace electrolytes.  Goal of Therapy: Electrolytes WNL  Plan:  No electrolyte replacement currently warranted. Patient following with nephrology and next HD session planned for tomorrow, 10/28. Patient no longer CCM so pharmacy will sign off consult for now.    Thank you for allowing pharmacy to be a part of this patient's care.  Will M. Dareen Piano, PharmD Clinical Pharmacist 02/19/2023 11:57 AM

## 2023-02-20 DIAGNOSIS — N186 End stage renal disease: Secondary | ICD-10-CM | POA: Diagnosis not present

## 2023-02-20 DIAGNOSIS — M869 Osteomyelitis, unspecified: Secondary | ICD-10-CM | POA: Diagnosis not present

## 2023-02-20 DIAGNOSIS — R579 Shock, unspecified: Secondary | ICD-10-CM | POA: Diagnosis not present

## 2023-02-20 DIAGNOSIS — I739 Peripheral vascular disease, unspecified: Secondary | ICD-10-CM | POA: Diagnosis not present

## 2023-02-20 LAB — RENAL FUNCTION PANEL
Albumin: 3.9 g/dL (ref 3.5–5.0)
Anion gap: 19 — ABNORMAL HIGH (ref 5–15)
BUN: 106 mg/dL — ABNORMAL HIGH (ref 6–20)
CO2: 21 mmol/L — ABNORMAL LOW (ref 22–32)
Calcium: 7 mg/dL — ABNORMAL LOW (ref 8.9–10.3)
Chloride: 99 mmol/L (ref 98–111)
Creatinine, Ser: 14.38 mg/dL — ABNORMAL HIGH (ref 0.61–1.24)
GFR, Estimated: 4 mL/min — ABNORMAL LOW (ref >60)
Glucose, Bld: 109 mg/dL — ABNORMAL HIGH (ref 70–99)
Phosphorus: 4.6 mg/dL (ref 2.5–4.6)
Potassium: 4.1 mmol/L (ref 3.5–5.1)
Sodium: 139 mmol/L (ref 135–145)

## 2023-02-20 LAB — CBC
HCT: 32.8 % — ABNORMAL LOW (ref 39.0–52.0)
Hemoglobin: 10.6 g/dL — ABNORMAL LOW (ref 13.0–17.0)
MCH: 29 pg (ref 26.0–34.0)
MCHC: 32.3 g/dL (ref 30.0–36.0)
MCV: 89.9 fL (ref 80.0–100.0)
Platelets: 160 10*3/uL (ref 150–400)
RBC: 3.65 MIL/uL — ABNORMAL LOW (ref 4.22–5.81)
RDW: 14.5 % (ref 11.5–15.5)
WBC: 9.6 10*3/uL (ref 4.0–10.5)
nRBC: 0 % (ref 0.0–0.2)

## 2023-02-20 MED ORDER — SODIUM CHLORIDE 0.9 % IV SOLN: 1.0000 g | INTRAVENOUS | Status: DC

## 2023-02-20 MED ORDER — MIDODRINE HCL 5 MG PO TABS
ORAL_TABLET | ORAL | Status: AC
  Filled 2023-02-20: qty 1

## 2023-02-20 MED ORDER — SODIUM CHLORIDE 0.9 % IV SOLN
1.0000 g | INTRAVENOUS | Status: DC
  Administered 2023-02-22: 1 g via INTRAVENOUS
  Filled 2023-02-20: qty 1

## 2023-02-20 MED ORDER — SODIUM CHLORIDE 0.9 % IV SOLN: 2.0000 g | INTRAVENOUS | Status: DC

## 2023-02-20 MED ORDER — ALTEPLASE 2 MG IJ SOLR: 2.0000 mg | Freq: Once | INTRAMUSCULAR | Status: DC | PRN

## 2023-02-20 NOTE — Progress Notes (Signed)
PHARMACY CONSULT NOTE FOR:  OUTPATIENT  PARENTERAL ANTIBIOTIC THERAPY (OPAT)  Indication: osteomyelitis right foot Regimen: Ceftazidime 1gm with HD on Tue and Thur and ceftazidime 2gm with HD on Saturdays End date: 03/05/23 (last dose with HD will be Saturday 03/03/2023)  Information provided to nephrology team  IV antibiotic discharge orders are pended. To discharging provider:  please sign these orders via discharge navigator,  Select New Orders & click on the button choice - Manage This Unsigned Work.    Thank you for allowing pharmacy to be a part of this patient's care.  Juliette Alcide, PharmD, BCPS, BCIDP Work Cell: [phone] 02/20/2023 10:35 AM

## 2023-02-20 NOTE — TOC Progression Note (Signed)
Transition of Care [facility]) - Progression Note    Patient Details  Name: Patrick Downs MRN: [id number] Date of Birth: Nov 27, 1975  [facility]) CM/SW Contact  Marlowe Sax, RN Phone Number: 02/20/2023, 10:55 AM  Clinical Narrative:    Received a call from [facility], they see that he is doing well and want to know what the barriers are for him going home with home health, they will send for Medical director review if he wants to go to STR and will possibly deny   Expected Discharge Plan: Skilled Nursing Facility Barriers to Discharge: Continued Medical Work up  Expected Discharge Plan and Services                                               Social Determinants of Health (SDOH) Interventions SDOH Screenings   Food Insecurity: No Food Insecurity (02/14/2023)  Housing: Patient Declined (02/14/2023)  Transportation Needs: No Transportation Needs (02/14/2023)  Utilities: Not At Risk (02/14/2023)  Depression (PHQ2-9): Low Risk  (06/27/2021)  Financial Resource Strain: Low Risk  (09/11/2022)   Received [facility], [facility]  Social Connections: Unknown (09/11/2022)   Received from [facility], [facility]  Stress: No Stress Concern Present (09/11/2022)   Received from [facility], [facility]  Tobacco Use: Low Risk  (02/19/2023)    Readmission Risk Interventions    02/15/2023    3:45 PM 01/22/2023   10:18 AM  Readmission Risk Prevention Plan  Transportation Screening Complete Complete  PCP or Specialist Appt within 3-5 Days Complete Complete  HRI or Home Care Consult Complete Complete  Social Work Consult for Recovery Care Planning/Counseling Complete Patient refused  Palliative Care Screening Not Applicable Not Applicable  Medication Review Oceanographer) Complete Complete

## 2023-02-20 NOTE — Plan of Care (Signed)
  Problem: Education: Goal: Knowledge of General Education information will improve Description: Including pain rating scale, medication(s)/side effects and non-pharmacologic comfort measures Outcome: Progressing   Problem: Health Behavior/Discharge Planning: Goal: Ability to manage health-related needs will improve Outcome: Progressing   Problem: Clinical Measurements: Goal: Diagnostic test results will improve Outcome: Progressing   Problem: Activity: Goal: Risk for activity intolerance will decrease Outcome: Progressing   Problem: Pain Management: Goal: General experience of comfort will improve Outcome: Progressing

## 2023-02-20 NOTE — TOC Progression Note (Signed)
Transition of Care [facility]) - Progression Note    Patient Details  Name: Patrick Downs MRN: [id number] Date of Birth: Sep 13, 1975  Transition of Care [facility]) CM/SW Contact  Marlowe Sax, RN Phone Number: 02/20/2023, 2:46 PM  Clinical Narrative:     Spoke with the patient regarding going to str or to home with home health. He would like to go to STR and asked that I let the Insurance know to continue with the review for STR I called [employer] and spoke to [place] The patient lives alone and has 5 steps to get into his home, he is not yet ready to maneuver steps and also has a wound vac to manage as well, he feels that he would do better at [facility] at least for a short time Aram Beecham at [place]  stated that they would add this information    Expected Discharge Plan: Skilled Nursing Facility Barriers to Discharge: Continued Medical Work up  Expected Discharge Plan and Services                                               Social Determinants of Health (SDOH) Interventions SDOH Screenings   Food Insecurity: No Food Insecurity (02/14/2023)  Housing: Patient Declined (02/14/2023)  Transportation Needs: No Transportation Needs (02/14/2023)  Utilities: Not At Risk (02/14/2023)  Depression (PHQ2-9): Low Risk  (06/27/2021)  Financial Resource Strain: Low Risk  (09/11/2022)   Received from [facility], [facility]  Social Connections: Unknown (09/11/2022)   Received from [facility], [facility]  Stress: No Stress Concern Present (09/11/2022)   Received from [facility], [facility]  Tobacco Use: Low Risk  (02/19/2023)    Readmission Risk Interventions    02/15/2023    3:45 PM 01/22/2023   10:18 AM  Readmission Risk Prevention Plan  Transportation Screening Complete Complete  PCP or Specialist Appt within 3-5 Days Complete Complete  HRI or Home Care Consult Complete  Complete  Social Work Consult for Recovery Care Planning/Counseling Complete Patient refused  Palliative Care Screening Not Applicable Not Applicable  Medication Review Oceanographer) Complete Complete

## 2023-02-20 NOTE — Progress Notes (Signed)
Progress Note   Patient: Patrick Downs WUX:[id number] DOB: [birth year] DOA: 02/13/2023     6 DOS: the patient was seen and examined on 02/20/2023   Brief hospital course: ICU transfer for 02/17/2023.  Taken from prior notes.  47 year old male with history of ESRD on hemodialysis presenting to the hospital with hypotension in the setting of hemodialysis requiring peripheral vasopressors.  Etiology of shock concerning for hypovolemia superimposed on Sildenafil along with adrenal insufficiency.   Patient was at rehab for assistance due to osteomyelitis and right foot requiring debridement, wound VAC and no weightbearing.  He is on ceftazidime with dialysis.  When EMS was called patient has systolic blood pressure in 50s.  Initially admitted in ICU as requiring IV pressors.  Labs and lactic acid was unremarkable.  Renal function at baseline.  Patient was started on stress doses of steroid, later weaned off from Levophed and midodrine dose was increased to 15 mg 3 times daily.  Patient also has an history of chronic hypertension alternating with hypotension requiring midodrine in the past but recently blood pressure was stable off the midodrine.  Echocardiogram done on 02/14/2023 with normal EF, grade 1 diastolic dysfunction and no other significant abnormality.  10/26: Patient with some intermittent mild hypotension during dialysis today, no symptoms.  Remained on high dose of Solu-Cortef with 100 mg Q8 hourly-started tapering. Remained on ceftazidime with dialysis for wound infection. PT/OT evaluation ordered.  As patient was recently discharged to SNF for rehab.  10/27: Hemodynamically stable, PT is recommending CIR but unfortunately insurance will not pay and patient will go back to his prior skilled nursing facility for further rehab. Blood pressure started trending up, decreasing midodrine to 5 mg with holding parameters. Will start on prednisone from tomorrow.  10/28:  Hemodynamically stable with mildly elevated blood pressure not requiring midodrine currently.  Pending repeat insurance authorization to [facility] to [facility].  ID is recommending continuation of 2 more weeks of ceftazidime with dialysis.  10/29: Remained stable.  Had his routine dialysis today.  Awaiting insurance authorization for rehab.  Assessment and Plan: * Shock circulatory [facility]) Patient presented with significant hypotension requiring pressors.  Likely multifactorial due to dialysis, using sildenafil and there was a concern of adrenal insufficiency. Now maintaining blood pressure although intermittently becoming soft on midodrine. He was also placed on stress doses of steroid -Decreasing the dose of midodrine to 5 mg 3 times daily as needed with holding parameter -Continue prednisone for 2 more days -Monitor blood pressure -Encourage p.o. hydration  Osteomyelitis of right foot (HCC) S/p debridement and wound VAC in place Patient was recently discharged to [facility] with wound VAC and to continue ceftazidime with dialysis and twice daily Flagyl until 10/28. -ID is on board and recommending 2 more week of Ceftazidime with dialysis   Hypotension Blood pressure started trending up.  History of hypo and hypertension. -Decreasing the dose of midodrine to 5 mg 3 times daily as needed with holding parameter -Started prednisone today for next 2 days  Peripheral neuropathy Chronic pain. History of Guillain-Barr. -Continue home Lyrica and Percocet  ESRD (end stage renal disease) (HCC) Getting dialysis today. -Nephrology is on board-will continue routine dialysis  PVD (peripheral vascular disease) (HCC) S/p PCI by vascular surgery on 01/24/2023. -Continue with aspirin and Plavix -Continue with statin  Impaired mobility Patient was recently discharged to [facility] for rehab. -PT and OT evaluation   Subjective: Patient was seen during dialysis today.  No new  concern.  Physical Exam: Vitals:   02/20/23 1100 02/20/23 1130 02/20/23 1200 02/20/23 1215  BP: (!) 90/46 (!) 97/49 (!) 112/50 112/74  Pulse: 80 76 76 72  Resp: 13 12 19 12   Temp:    98.4 F (36.9 C)  TempSrc:    Oral  SpO2: 99% 98% 99% 99%  Weight:    123.4 kg  Height:       General.  Obese gentleman, in no acute distress. Pulmonary.  Lungs clear bilaterally, normal respiratory effort. CV.  Regular rate and rhythm, no JVD, rub or murmur. Abdomen.  Soft, nontender, nondistended, BS positive. CNS.  Alert and oriented .  No focal neurologic deficit. Extremities.  No edema, no cyanosis, pulses intact and symmetrical.  Right foot with bandage Psychiatry.  Judgment and insight appears normal.   Data Reviewed: Prior data reviewed  Family Communication: Discussed with patient,   Disposition: Status is: Inpatient Remains inpatient appropriate because: Severity of illness  Planned Discharge Destination: SNF  DVT prophylaxis.  Subcu heparin Time spent: 40 minutes  This record has been created using Conservation officer, historic buildings. Errors have been sought and corrected,but may not always be located. Such creation errors do not reflect on the standard of care.   Author: Arnetha Courser, MD 02/20/2023 2:26 PM  For on call review [email or web address].

## 2023-02-20 NOTE — TOC Progression Note (Signed)
Transition of Care [facility]) - Progression Note    Patient Details  Name: Patrick Downs MRN: [id number] Date of Birth: [birth year]  Transition of Care [facility]) CM/SW Contact  Marlowe Sax, RN Phone Number: 02/20/2023, 4:44 PM  Clinical Narrative:    [employer] called to offer a peer to peer, I notified the physician, has to be called by 1030 AM tomorrow   Expected Discharge Plan: Skilled Nursing Facility Barriers to Discharge: Continued Medical Work up  Expected Discharge Plan and Services                                               Social Determinants of Health (SDOH) Interventions SDOH Screenings   Food Insecurity: No Food Insecurity (02/14/2023)  Housing: Patient Declined (02/14/2023)  Transportation Needs: No Transportation Needs (02/14/2023)  Utilities: Not At Risk (02/14/2023)  Depression (PHQ2-9): Low Risk  (06/27/2021)  Financial Resource Strain: Low Risk  (09/11/2022)   Received from [facility], [facility]  Social Connections: Unknown (09/11/2022)   Received from [facility], [facility]  Stress: No Stress Concern Present (09/11/2022)   Received from [facility], [facility]  Tobacco Use: Low Risk  (02/19/2023)    Readmission Risk Interventions    02/15/2023    3:45 PM 01/22/2023   10:18 AM  Readmission Risk Prevention Plan  Transportation Screening Complete Complete  PCP or Specialist Appt within 3-5 Days Complete Complete  HRI or Home Care Consult Complete Complete  Social Work Consult for Recovery Care Planning/Counseling Complete Patient refused  Palliative Care Screening Not Applicable Not Applicable  Medication Review Oceanographer) Complete Complete

## 2023-02-20 NOTE — Progress Notes (Addendum)
[facility]  ROUNDING NOTE   Subjective:   Mr. SANTHIAGO PANEK was admitted to [facility] on 02/13/2023 for Shock circulatory [facility]) [R57.9] Hypotension, unspecified hypotension type [I95.9]  Patient is known to our practice and receives outpatient dialysis at [facility] on a TTS schedule, supervised by [facility] physicians.   Update: Patient seen and evaluated during dialysis   HEMODIALYSIS FLOWSHEET:  Blood Flow Rate (mL/min): 399 mL/min Arterial Pressure (mmHg): -154.74 mmHg Venous Pressure (mmHg): 170.09 mmHg TMP (mmHg): 10.3 mmHg Ultrafiltration Rate (mL/min): 543 mL/min Dialysate Flow Rate (mL/min): 300 ml/min Bolus Amount (mL): 200 mL  Tolerating treatment Denies pain   Objective:  Vital signs in last 24 hours:  Temp:  [97.9 F (36.6 C)-98.5 F (36.9 C)] 97.9 F (36.6 C) (10/29 0746) Pulse Rate:  [69-96] 71 (10/29 0900) Resp:  [11-18] 11 (10/29 0900) BP: (123-140)/(55-87) 134/68 (10/29 0900) SpO2:  [98 %-100 %] 99 % (10/29 0900) Weight:  [125.6 kg] 125.6 kg (10/29 0746)  Weight change:  Filed Weights   02/18/23 0706 02/19/23 0440 02/20/23 0746  Weight: 118.6 kg 125.9 kg 125.6 kg    Intake/Output: I/O last 3 completed shifts: In: 240 [P.O.:240] Out: -    Intake/Output this shift:  No intake/output data recorded.  Physical Exam: General: No acute distress  Head: Normocephalic, atraumatic. Moist oral mucosal membranes  Lungs:  Clear to auscultation, normal effort  Heart: S1S2 no rubs  Abdomen:  Soft, nontender, bowel sounds present  Extremities: No peripheral edema.  Neurologic: Awake, alert, following commands  Skin: No acute rash, RLE wound (NPWV)  Access: Right IJ PermCath    Basic Metabolic Panel: Recent Labs  Lab 02/14/23 0558 02/15/23 0328 02/16/23 0542 02/17/23 0351 02/17/23 0844 02/18/23 0552 02/19/23 0601  NA 136 139 135 137 137 138 142  K 3.8 4.6 3.9 6.4* 4.7 4.5 4.8  CL 100 103 98 101 99 99 100  CO2 21* 20* 24 19*  21* 23 22  GLUCOSE 146* 105* 146* 120* 165* 164* 90  BUN 43* 57* 47* 64* 71* 58* 80*  CREATININE 9.19* 11.37* 10.02* 12.29* 13.75* 9.64* 11.68*  CALCIUM 8.0* 8.5* 7.8* 8.0* 7.6* 7.8* 7.6*  MG 2.0 2.3 1.9 2.1  --   --   --   PHOS 3.3 4.0 3.6 2.6 2.1* 3.5 6.4*    Liver Function Tests: Recent Labs  Lab 02/13/23 1624 02/15/23 0328 02/17/23 0351 02/17/23 0844 02/18/23 0552 02/19/23 0601  AST 27  --   --   --   --   --   ALT 30  --   --   --   --   --   ALKPHOS 82  --   --   --   --   --   BILITOT 0.7  --   --   --   --   --   PROT 8.7*  --   --   --   --   --   ALBUMIN 3.9 3.4* 3.8 3.3* 3.8 4.0   No results for input(s): "LIPASE", "AMYLASE" in the last 168 hours. No results for input(s): "AMMONIA" in the last 168 hours.  CBC: Recent Labs  Lab 02/13/23 1624 02/14/23 0558 02/16/23 0542 02/17/23 0351 02/18/23 0552 02/19/23 0601 02/20/23 0837  WBC 9.1   < > 5.7 7.9 7.7 7.7 9.6  NEUTROABS 6.7  --   --   --   --   --   --   HGB 12.2*   < >  10.9* 12.6* 11.5* 11.7* 10.6*  HCT 38.7*   < > 33.3* 39.4 35.4* 36.7* 32.8*  MCV 91.9   < > 87.9 90.4 88.9 90.0 89.9  PLT 118*   < > 126* 146* 158 135* 160   < > = values in this interval not displayed.    Cardiac Enzymes: No results for input(s): "CKTOTAL", "CKMB", "CKMBINDEX", "TROPONINI" in the last 168 hours.  BNP: Invalid input(s): "POCBNP"  CBG: Recent Labs  Lab 02/14/23 0235  GLUCAP 93     Microbiology: Results for orders placed or performed during the hospital encounter of 02/13/23  MRSA Next Gen by PCR, Nasal     Status: None   Collection Time: 02/14/23  2:15 AM   Specimen: Nasal Mucosa; Nasal Swab  Result Value Ref Range Status   MRSA by PCR Next Gen NOT DETECTED NOT DETECTED Final    Comment: (NOTE) The GeneXpert MRSA Assay (FDA approved for NASAL specimens only), is one component of a comprehensive MRSA colonization surveillance program. It is not intended to diagnose MRSA infection nor to guide or monitor  treatment for MRSA infections. Test performance is not FDA approved in patients less than 48 years old. Performed at [facility], [address] [place], [place] [address]     Coagulation Studies: No results for input(s): "LABPROT", "INR" in the last 72 hours.   Urinalysis: No results for input(s): "COLORURINE", "LABSPEC", "PHURINE", "GLUCOSEU", "HGBUR", "BILIRUBINUR", "KETONESUR", "PROTEINUR", "UROBILINOGEN", "NITRITE", "LEUKOCYTESUR" in the last 72 hours.  Invalid input(s): "APPERANCEUR"    Imaging: No results found.   Medications:    cefTAZidime (FORTAZ)  IV Stopped (02/17/23 1219)    (feeding supplement) PROSource Plus  30 mL Oral TID BM   ascorbic acid  500 mg Oral BID   aspirin EC  81 mg Oral Daily   atorvastatin  40 mg Oral Daily   Chlorhexidine Gluconate Cloth  6 each Topical QHS   clopidogrel  75 mg Oral Daily   heparin  5,000 Units Subcutaneous Q8H   midodrine  5 mg Oral TID WC   multivitamin  1 tablet Oral QHS   nutrition supplement (JUVEN)  1 packet Oral BID BM   predniSONE  50 mg Oral Q breakfast   pregabalin  25 mg Oral Daily   zinc sulfate  220 mg Oral Daily   alteplase, docusate sodium, heparin sodium (porcine), hydrOXYzine, mouth rinse, oxyCODONE-acetaminophen, polyethylene glycol  Assessment/ Plan:  47 y.o. male ESRD on HD TTS, CHF, history of Guillain-Barr syndrome, hypertension, anemia of chronic kidney disease, secondary hyperparathyroidism, history of right foot toe amputation who presents with ulceration on lateral aspect of right foot.  [facility]  1.  ESRD on HD TTS.  Receiving treatment today, UF goal 1L as tolerated. Next treatment scheduled for Thursday.   2.  Anemia of chronic kidney disease.   Lab Results  Component Value Date   HGB 10.6 (L) 02/20/2023   Patient receives Mircera as outpatient.  Hemoglobin at goal.  3.  Secondary hyperparathyroidism with outpatient labs: PTH 204, calcium 6.9, and  phos 7.6 on 02/06/23  - Calcium will correct some with dialysis.   4.  Diabetes mellitus type II with chronic kidney disease and complication of right foot osteomyelitis:  -Completed ceftazidime per ID recommendations  5. Hypotension, believed due to hypovolemia and sedation. Placed in ICU with Levophed drip and midodrine.   Continue Midodrine 5mg  three times a day  6. 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 ID  recommending to continue Ceftazidime with dialysis, 1g/1g/2g until 03/05/23   LOS: 6 Jhaden Pizzuto 10/29/20249:38 AM

## 2023-02-20 NOTE — Progress Notes (Signed)
Hemodialysis note  Received patient in bed to unit. Alert and oriented.  Informed consent signed and in chart.  Treatment initiated: 0840 Treatment completed: 1215  Patient tolerated well. Transported back to room, alert without acute distress.  Report given to patient's RN.   Access used: Right Chest HD Catheter Access issues: none  Total UF removed: 1L Medication(s) given:  Ceftazedime 1g IV, Midodrine 5 mg tab PO  Post HD weight: 123.4 kg   Wolfgang Phoenix [facility]

## 2023-02-21 DIAGNOSIS — N186 End stage renal disease: Secondary | ICD-10-CM | POA: Diagnosis not present

## 2023-02-21 DIAGNOSIS — G6289 Other specified polyneuropathies: Secondary | ICD-10-CM

## 2023-02-21 DIAGNOSIS — R579 Shock, unspecified: Secondary | ICD-10-CM | POA: Diagnosis not present

## 2023-02-21 DIAGNOSIS — M869 Osteomyelitis, unspecified: Secondary | ICD-10-CM | POA: Diagnosis not present

## 2023-02-21 MED ORDER — MIDODRINE HCL 5 MG PO TABS: 5.0000 mg | ORAL_TABLET | Freq: Three times a day (TID) | ORAL | 1 refills | Status: AC | PRN

## 2023-02-21 MED ORDER — POLYETHYLENE GLYCOL 3350 17 G PO PACK: 17.0000 g | PACK | Freq: Every day | ORAL | 0 refills | Status: DC | PRN

## 2023-02-21 MED ORDER — CEFTAZIDIME IV (FOR PTA / DISCHARGE USE ONLY): INTRAVENOUS | Status: AC

## 2023-02-21 MED ORDER — DOCUSATE SODIUM 100 MG PO CAPS: 100.0000 mg | ORAL_CAPSULE | Freq: Two times a day (BID) | ORAL | 0 refills | Status: DC | PRN

## 2023-02-21 MED ORDER — GABAPENTIN 100 MG PO CAPS
100.0000 mg | ORAL_CAPSULE | Freq: Once | ORAL | Status: AC
  Administered 2023-02-21: 100 mg via ORAL
  Filled 2023-02-21: qty 1

## 2023-02-21 MED ORDER — OXYCODONE-ACETAMINOPHEN 5-325 MG PO TABS: 1.0000 | ORAL_TABLET | ORAL | 0 refills | Status: DC | PRN

## 2023-02-21 NOTE — Progress Notes (Signed)
Patient  discharge was  cancelled. The canisters were delivered to room, and patient will go to dialysis in the morning.

## 2023-02-21 NOTE — TOC Progression Note (Addendum)
Transition of Care [facility]) - Progression Note    Patient Details  Name: ESPN EBERLEIN MRN: [id number] Date of Birth: Jan 13, 1976  Transition of Care [facility]) CM/SW Contact  Marlowe Sax, RN Phone Number: 02/21/2023, 2:02 PM  Clinical Narrative:     The patient reports that he does not have any canisters at home for the wound vac, I called tracy with [facility] and requested Canisters so that he can DC, I reached out to [facility] to see what agency was going to accept the patient  [place] at 3 [facility] will get canisters  delivered her to the hospital but at the earliest it will be tonight before they can get here  Expected Discharge Plan: Skilled Nursing Facility Barriers to Discharge: Continued Medical Work up  Expected Discharge Plan and Services         Expected Discharge Date: 02/21/23                                     Social Determinants of Health (SDOH) Interventions SDOH Screenings   Food Insecurity: No Food Insecurity (02/14/2023)  Housing: Patient Declined (02/14/2023)  Transportation Needs: No Transportation Needs (02/14/2023)  Utilities: Not At Risk (02/14/2023)  Depression (PHQ2-9): Low Risk  (06/27/2021)  Financial Resource Strain: Low Risk  (09/11/2022)   Received from [facility], [facility]  Social Connections: Unknown (09/11/2022)   Received from [facility], [facility]  Stress: No Stress Concern Present (09/11/2022)   Received from [facility], [facility]  Tobacco Use: Low Risk  (02/19/2023)    Readmission Risk Interventions    02/15/2023    3:45 PM 01/22/2023   10:18 AM  Readmission Risk Prevention Plan  Transportation Screening Complete Complete  PCP or Specialist Appt within 3-5 Days Complete Complete  HRI or Home Care Consult Complete Complete  Social Work Consult for Recovery Care Planning/Counseling Complete Patient refused  Palliative Care  Screening Not Applicable Not Applicable  Medication Review Oceanographer) Complete Complete

## 2023-02-21 NOTE — TOC Progression Note (Signed)
Transition of Care [facility]) - Progression Note    Patient Details  Name: Patrick Downs MRN: [id number] Date of Birth: 04-12-1976  [facility]) CM/SW Contact  Marlowe Sax, RN Phone Number: 02/21/2023, 3:34 PM  Clinical Narrative:    [facility] accepted the patient for [facility] services   Expected Discharge Plan: Skilled Nursing Facility Barriers to Discharge: Continued Medical Work up  Expected Discharge Plan and Services         Expected Discharge Date: 02/21/23                                     Social Determinants of Health (SDOH) Interventions SDOH Screenings   Food Insecurity: No Food Insecurity (02/14/2023)  Housing: Patient Declined (02/14/2023)  Transportation Needs: No Transportation Needs (02/14/2023)  Utilities: Not At Risk (02/14/2023)  Depression (PHQ2-9): Low Risk  (06/27/2021)  Financial Resource Strain: Low Risk  (09/11/2022)   Received from [facility], [facility]  Social Connections: Unknown (09/11/2022)   Received from [facility], [facility]  Stress: No Stress Concern Present (09/11/2022)   Received from [facility], [facility]  Tobacco Use: Low Risk  (02/19/2023)    Readmission Risk Interventions    02/15/2023    3:45 PM 01/22/2023   10:18 AM  Readmission Risk Prevention Plan  Transportation Screening Complete Complete  PCP or Specialist Appt within 3-5 Days Complete Complete  HRI or Home Care Consult Complete Complete  Social Work Consult for Recovery Care Planning/Counseling Complete Patient refused  Palliative Care Screening Not Applicable Not Applicable  Medication Review Oceanographer) Complete Complete

## 2023-02-21 NOTE — TOC Progression Note (Signed)
Transition of Care [facility]) - Progression Note    Patient Details  Name: Patrick Downs MRN: [id number] Date of Birth: 10/14/1975  Transition of Care [facility]) CM/SW Contact  Marlowe Sax, RN Phone Number: 02/21/2023, 12:07 PM  Clinical Narrative:     Met with the patent and he reports that he does have a wound vac at home, his mom will bring it so that the nurse can hook it up, he will need a Knee scooter, adapt to deliver, he was provided with a BP cuff   Expected Discharge Plan: Skilled Nursing Facility Barriers to Discharge: Continued Medical Work up  Expected Discharge Plan and Services         Expected Discharge Date: 02/21/23                                     Social Determinants of Health (SDOH) Interventions SDOH Screenings   Food Insecurity: No Food Insecurity (02/14/2023)  Housing: Patient Declined (02/14/2023)  Transportation Needs: No Transportation Needs (02/14/2023)  Utilities: Not At Risk (02/14/2023)  Depression (PHQ2-9): Low Risk  (06/27/2021)  Financial Resource Strain: Low Risk  (09/11/2022)   Received from [facility], [facility]  Social Connections: Unknown (09/11/2022)   Received from [facility], [facility]  Stress: No Stress Concern Present (09/11/2022)   Received from [facility], [facility]  Tobacco Use: Low Risk  (02/19/2023)    Readmission Risk Interventions    02/15/2023    3:45 PM 01/22/2023   10:18 AM  Readmission Risk Prevention Plan  Transportation Screening Complete Complete  PCP or Specialist Appt within 3-5 Days Complete Complete  HRI or Home Care Consult Complete Complete  Social Work Consult for Recovery Care Planning/Counseling Complete Patient refused  Palliative Care Screening Not Applicable Not Applicable  Medication Review Oceanographer) Complete Complete

## 2023-02-21 NOTE — Discharge Summary (Signed)
Physician Discharge Summary   Patient: Patrick Downs MRN: [id number] DOB: [birth year]  Admit date:     02/13/2023  Discharge date: 02/21/23  Discharge Physician: Arnetha Courser   PCP: Mick Sell, MD   Recommendations at discharge:  Please obtain CBC and renal function on follow-up Patient need wound VAC and dressing change on Monday and Thursday Please ensure that he complete the course of antibiotics which will be administered during dialysis Follow-up with primary care provider Follow-up with vascular surgery for further wound care  Discharge Diagnoses: Principal Problem:   Shock circulatory [facility]) Active Problems:   Osteomyelitis of right foot (HCC)   Hypotension   Peripheral neuropathy   ESRD (end stage renal disease) (HCC)   PVD (peripheral vascular disease) (HCC)   Impaired mobility   Hospital Course: ICU transfer for 02/17/2023.  Taken from prior notes.  47 year old male with history of ESRD on hemodialysis presenting to the hospital with hypotension in the setting of hemodialysis requiring peripheral vasopressors.  Etiology of shock concerning for hypovolemia superimposed on Sildenafil along with adrenal insufficiency.   Patient was at rehab for assistance due to osteomyelitis and right foot requiring debridement, wound VAC and no weightbearing.  He is on ceftazidime with dialysis.  When EMS was called patient has systolic blood pressure in 50s.  Initially admitted in ICU as requiring IV pressors.  Labs and lactic acid was unremarkable.  Renal function at baseline.  Patient was started on stress doses of steroid, later weaned off from Levophed and midodrine dose was increased to 15 mg 3 times daily.  Patient also has an history of chronic hypertension alternating with hypotension requiring midodrine in the past but recently blood pressure was stable off the midodrine.  Echocardiogram done on 02/14/2023 with normal EF, grade 1 diastolic dysfunction and no  other significant abnormality.  10/26: Patient with some intermittent mild hypotension during dialysis today, no symptoms.  Remained on high dose of Solu-Cortef with 100 mg Q8 hourly-started tapering. Remained on ceftazidime with dialysis for wound infection. PT/OT evaluation ordered.  As patient was recently discharged to SNF for rehab.  10/27: Hemodynamically stable, PT is recommending CIR but unfortunately insurance will not pay and patient will go back to his prior skilled nursing facility for further rehab. Blood pressure started trending up, decreasing midodrine to 5 mg with holding parameters. Will start on prednisone from tomorrow.  10/28: Hemodynamically stable with mildly elevated blood pressure not requiring midodrine currently.  Pending repeat insurance authorization to go back to [facility].  ID is recommending continuation of 2 more weeks of ceftazidime with dialysis.  10/29: Remained stable.  Had his routine dialysis today.  Awaiting insurance authorization for rehab.  10/30: Patient remained hemodynamically stable.  Insurance denied him going back to rehab so he is being discharged home with home health services.  He will continue with wound VAC and change on Monday and Thursdays.  He need to follow-up with vascular surgery for further recommendations.  Patient was also instructed to check his blood pressure 2-3 times daily and take midodrine if systolic is below 120.  He can continue taking on the day of dialysis if having lower blood pressure with dialysis.  Patient has labile blood pressure with quite significant variation.  Patient will also continue ceftazidime for 2 more weeks which will be administered during dialysis.  He will continue the rest of his home medications.  Completed the course of steroid.  Patient need to follow-up with his providers for  Physician Discharge Summary   Patient: Patrick Downs MRN: [id number] DOB: [birth year]  Admit date:     02/13/2023  Discharge date: 02/21/23  Discharge Physician: Arnetha Courser   PCP: Mick Sell, MD   Recommendations at discharge:  Please obtain CBC and renal function on follow-up Patient need wound VAC and dressing change on Monday and Thursday Please ensure that he complete the course of antibiotics which will be administered during dialysis Follow-up with primary care provider Follow-up with vascular surgery for further wound care  Discharge Diagnoses: Principal Problem:   Shock circulatory [facility]) Active Problems:   Osteomyelitis of right foot (HCC)   Hypotension   Peripheral neuropathy   ESRD (end stage renal disease) (HCC)   PVD (peripheral vascular disease) (HCC)   Impaired mobility   Hospital Course: ICU transfer for 02/17/2023.  Taken from prior notes.  47 year old male with history of ESRD on hemodialysis presenting to the hospital with hypotension in the setting of hemodialysis requiring peripheral vasopressors.  Etiology of shock concerning for hypovolemia superimposed on Sildenafil along with adrenal insufficiency.   Patient was at rehab for assistance due to osteomyelitis and right foot requiring debridement, wound VAC and no weightbearing.  He is on ceftazidime with dialysis.  When EMS was called patient has systolic blood pressure in 50s.  Initially admitted in ICU as requiring IV pressors.  Labs and lactic acid was unremarkable.  Renal function at baseline.  Patient was started on stress doses of steroid, later weaned off from Levophed and midodrine dose was increased to 15 mg 3 times daily.  Patient also has an history of chronic hypertension alternating with hypotension requiring midodrine in the past but recently blood pressure was stable off the midodrine.  Echocardiogram done on 02/14/2023 with normal EF, grade 1 diastolic dysfunction and no  other significant abnormality.  10/26: Patient with some intermittent mild hypotension during dialysis today, no symptoms.  Remained on high dose of Solu-Cortef with 100 mg Q8 hourly-started tapering. Remained on ceftazidime with dialysis for wound infection. PT/OT evaluation ordered.  As patient was recently discharged to SNF for rehab.  10/27: Hemodynamically stable, PT is recommending CIR but unfortunately insurance will not pay and patient will go back to his prior skilled nursing facility for further rehab. Blood pressure started trending up, decreasing midodrine to 5 mg with holding parameters. Will start on prednisone from tomorrow.  10/28: Hemodynamically stable with mildly elevated blood pressure not requiring midodrine currently.  Pending repeat insurance authorization to go back to [facility].  ID is recommending continuation of 2 more weeks of ceftazidime with dialysis.  10/29: Remained stable.  Had his routine dialysis today.  Awaiting insurance authorization for rehab.  10/30: Patient remained hemodynamically stable.  Insurance denied him going back to rehab so he is being discharged home with home health services.  He will continue with wound VAC and change on Monday and Thursdays.  He need to follow-up with vascular surgery for further recommendations.  Patient was also instructed to check his blood pressure 2-3 times daily and take midodrine if systolic is below 120.  He can continue taking on the day of dialysis if having lower blood pressure with dialysis.  Patient has labile blood pressure with quite significant variation.  Patient will also continue ceftazidime for 2 more weeks which will be administered during dialysis.  He will continue the rest of his home medications.  Completed the course of steroid.  Patient need to follow-up with his providers for  Imaging Studies: ECHOCARDIOGRAM COMPLETE  Result Date: 02/14/2023    ECHOCARDIOGRAM REPORT   Patient Name:   CORRY PEOPLES Solanki Date of Exam: 02/14/2023 Medical Rec #:  [id number]       Height:       74.0 in Accession #:    [id number]      Weight:       262.8 lb Date of Birth:  1975-11-09       BSA:          2.441 m Patient Age:    47 years        BP:           83/49 mmHg Patient Gender: M               HR:           74 bpm. Exam Location:  [facility] Procedure: 2D Echo, Cardiac Doppler and Color Doppler STAT ECHO Indications:     Shock R57.9  History:         Patient has prior history of Echocardiogram examinations, most                  recent 01/22/2018. CHF, Signs/Symptoms:Murmur; Risk                  Factors:Hypertension. ESRD.  Sonographer:     Cristela Blue Referring Phys:  [id number] KHABIB DGAYLI Diagnosing Phys: Julien Nordmann MD IMPRESSIONS  1. Left ventricular ejection fraction, by estimation, is 60 to 65%. The left ventricle has normal function. The left ventricle has no regional wall motion abnormalities. Left ventricular diastolic  parameters are consistent with Grade I diastolic dysfunction (impaired relaxation).  2. Right ventricular systolic function is normal. The right ventricular size is normal. There is normal pulmonary artery systolic pressure. The estimated right ventricular systolic pressure is 15.9 mmHg.  3. The mitral valve is normal in structure. No evidence of mitral valve regurgitation. No evidence of mitral stenosis.  4. The aortic valve is tricuspid. Aortic valve regurgitation is not visualized. No aortic stenosis is present. FINDINGS  Left Ventricle: Left ventricular ejection fraction, by estimation, is 60 to 65%. The left ventricle has normal function. The left ventricle has no regional wall motion abnormalities. The left ventricular internal cavity size was normal in size. There is  no left ventricular hypertrophy. Left ventricular diastolic parameters are consistent with Grade I diastolic dysfunction (impaired relaxation). Right Ventricle: The right ventricular size is normal. No increase in right ventricular wall thickness. Right ventricular systolic function is normal. There is normal pulmonary artery systolic pressure. The tricuspid regurgitant velocity is 1.65 m/s, and  with an assumed right atrial pressure of 5 mmHg, the estimated right ventricular systolic pressure is 15.9 mmHg. Left Atrium: Left atrial size was normal in size. Right Atrium: Right atrial size was normal in size. Pericardium: There is no evidence of pericardial effusion. Mitral Valve: The mitral valve is normal in structure. No evidence of mitral valve regurgitation. No evidence of mitral valve stenosis. MV peak gradient, 4.4 mmHg. The mean mitral valve gradient is 2.0 mmHg. Tricuspid Valve: The tricuspid valve is normal in structure. Tricuspid valve regurgitation is not demonstrated. No evidence of tricuspid stenosis. Aortic Valve: The aortic valve is tricuspid. Aortic valve regurgitation is not visualized. No aortic stenosis is present. Aortic valve  mean gradient measures 5.0 mmHg. Aortic valve peak gradient measures 8.0 mmHg. Aortic valve area, by VTI measures 2.57 cm. Pulmonic Valve: The pulmonic valve was normal in structure. Pulmonic  Physician Discharge Summary   Patient: Patrick Downs MRN: [id number] DOB: [birth year]  Admit date:     02/13/2023  Discharge date: 02/21/23  Discharge Physician: Arnetha Courser   PCP: Mick Sell, MD   Recommendations at discharge:  Please obtain CBC and renal function on follow-up Patient need wound VAC and dressing change on Monday and Thursday Please ensure that he complete the course of antibiotics which will be administered during dialysis Follow-up with primary care provider Follow-up with vascular surgery for further wound care  Discharge Diagnoses: Principal Problem:   Shock circulatory [facility]) Active Problems:   Osteomyelitis of right foot (HCC)   Hypotension   Peripheral neuropathy   ESRD (end stage renal disease) (HCC)   PVD (peripheral vascular disease) (HCC)   Impaired mobility   Hospital Course: ICU transfer for 02/17/2023.  Taken from prior notes.  47 year old male with history of ESRD on hemodialysis presenting to the hospital with hypotension in the setting of hemodialysis requiring peripheral vasopressors.  Etiology of shock concerning for hypovolemia superimposed on Sildenafil along with adrenal insufficiency.   Patient was at rehab for assistance due to osteomyelitis and right foot requiring debridement, wound VAC and no weightbearing.  He is on ceftazidime with dialysis.  When EMS was called patient has systolic blood pressure in 50s.  Initially admitted in ICU as requiring IV pressors.  Labs and lactic acid was unremarkable.  Renal function at baseline.  Patient was started on stress doses of steroid, later weaned off from Levophed and midodrine dose was increased to 15 mg 3 times daily.  Patient also has an history of chronic hypertension alternating with hypotension requiring midodrine in the past but recently blood pressure was stable off the midodrine.  Echocardiogram done on 02/14/2023 with normal EF, grade 1 diastolic dysfunction and no  other significant abnormality.  10/26: Patient with some intermittent mild hypotension during dialysis today, no symptoms.  Remained on high dose of Solu-Cortef with 100 mg Q8 hourly-started tapering. Remained on ceftazidime with dialysis for wound infection. PT/OT evaluation ordered.  As patient was recently discharged to SNF for rehab.  10/27: Hemodynamically stable, PT is recommending CIR but unfortunately insurance will not pay and patient will go back to his prior skilled nursing facility for further rehab. Blood pressure started trending up, decreasing midodrine to 5 mg with holding parameters. Will start on prednisone from tomorrow.  10/28: Hemodynamically stable with mildly elevated blood pressure not requiring midodrine currently.  Pending repeat insurance authorization to go back to [facility].  ID is recommending continuation of 2 more weeks of ceftazidime with dialysis.  10/29: Remained stable.  Had his routine dialysis today.  Awaiting insurance authorization for rehab.  10/30: Patient remained hemodynamically stable.  Insurance denied him going back to rehab so he is being discharged home with home health services.  He will continue with wound VAC and change on Monday and Thursdays.  He need to follow-up with vascular surgery for further recommendations.  Patient was also instructed to check his blood pressure 2-3 times daily and take midodrine if systolic is below 120.  He can continue taking on the day of dialysis if having lower blood pressure with dialysis.  Patient has labile blood pressure with quite significant variation.  Patient will also continue ceftazidime for 2 more weeks which will be administered during dialysis.  He will continue the rest of his home medications.  Completed the course of steroid.  Patient need to follow-up with his providers for  Physician Discharge Summary   Patient: Patrick Downs MRN: [id number] DOB: [birth year]  Admit date:     02/13/2023  Discharge date: 02/21/23  Discharge Physician: Arnetha Courser   PCP: Mick Sell, MD   Recommendations at discharge:  Please obtain CBC and renal function on follow-up Patient need wound VAC and dressing change on Monday and Thursday Please ensure that he complete the course of antibiotics which will be administered during dialysis Follow-up with primary care provider Follow-up with vascular surgery for further wound care  Discharge Diagnoses: Principal Problem:   Shock circulatory [facility]) Active Problems:   Osteomyelitis of right foot (HCC)   Hypotension   Peripheral neuropathy   ESRD (end stage renal disease) (HCC)   PVD (peripheral vascular disease) (HCC)   Impaired mobility   Hospital Course: ICU transfer for 02/17/2023.  Taken from prior notes.  47 year old male with history of ESRD on hemodialysis presenting to the hospital with hypotension in the setting of hemodialysis requiring peripheral vasopressors.  Etiology of shock concerning for hypovolemia superimposed on Sildenafil along with adrenal insufficiency.   Patient was at rehab for assistance due to osteomyelitis and right foot requiring debridement, wound VAC and no weightbearing.  He is on ceftazidime with dialysis.  When EMS was called patient has systolic blood pressure in 50s.  Initially admitted in ICU as requiring IV pressors.  Labs and lactic acid was unremarkable.  Renal function at baseline.  Patient was started on stress doses of steroid, later weaned off from Levophed and midodrine dose was increased to 15 mg 3 times daily.  Patient also has an history of chronic hypertension alternating with hypotension requiring midodrine in the past but recently blood pressure was stable off the midodrine.  Echocardiogram done on 02/14/2023 with normal EF, grade 1 diastolic dysfunction and no  other significant abnormality.  10/26: Patient with some intermittent mild hypotension during dialysis today, no symptoms.  Remained on high dose of Solu-Cortef with 100 mg Q8 hourly-started tapering. Remained on ceftazidime with dialysis for wound infection. PT/OT evaluation ordered.  As patient was recently discharged to SNF for rehab.  10/27: Hemodynamically stable, PT is recommending CIR but unfortunately insurance will not pay and patient will go back to his prior skilled nursing facility for further rehab. Blood pressure started trending up, decreasing midodrine to 5 mg with holding parameters. Will start on prednisone from tomorrow.  10/28: Hemodynamically stable with mildly elevated blood pressure not requiring midodrine currently.  Pending repeat insurance authorization to go back to [facility].  ID is recommending continuation of 2 more weeks of ceftazidime with dialysis.  10/29: Remained stable.  Had his routine dialysis today.  Awaiting insurance authorization for rehab.  10/30: Patient remained hemodynamically stable.  Insurance denied him going back to rehab so he is being discharged home with home health services.  He will continue with wound VAC and change on Monday and Thursdays.  He need to follow-up with vascular surgery for further recommendations.  Patient was also instructed to check his blood pressure 2-3 times daily and take midodrine if systolic is below 120.  He can continue taking on the day of dialysis if having lower blood pressure with dialysis.  Patient has labile blood pressure with quite significant variation.  Patient will also continue ceftazidime for 2 more weeks which will be administered during dialysis.  He will continue the rest of his home medications.  Completed the course of steroid.  Patient need to follow-up with his providers for  Imaging Studies: ECHOCARDIOGRAM COMPLETE  Result Date: 02/14/2023    ECHOCARDIOGRAM REPORT   Patient Name:   CORRY PEOPLES Solanki Date of Exam: 02/14/2023 Medical Rec #:  [id number]       Height:       74.0 in Accession #:    [id number]      Weight:       262.8 lb Date of Birth:  1975-11-09       BSA:          2.441 m Patient Age:    47 years        BP:           83/49 mmHg Patient Gender: M               HR:           74 bpm. Exam Location:  [facility] Procedure: 2D Echo, Cardiac Doppler and Color Doppler STAT ECHO Indications:     Shock R57.9  History:         Patient has prior history of Echocardiogram examinations, most                  recent 01/22/2018. CHF, Signs/Symptoms:Murmur; Risk                  Factors:Hypertension. ESRD.  Sonographer:     Cristela Blue Referring Phys:  [id number] KHABIB DGAYLI Diagnosing Phys: Julien Nordmann MD IMPRESSIONS  1. Left ventricular ejection fraction, by estimation, is 60 to 65%. The left ventricle has normal function. The left ventricle has no regional wall motion abnormalities. Left ventricular diastolic  parameters are consistent with Grade I diastolic dysfunction (impaired relaxation).  2. Right ventricular systolic function is normal. The right ventricular size is normal. There is normal pulmonary artery systolic pressure. The estimated right ventricular systolic pressure is 15.9 mmHg.  3. The mitral valve is normal in structure. No evidence of mitral valve regurgitation. No evidence of mitral stenosis.  4. The aortic valve is tricuspid. Aortic valve regurgitation is not visualized. No aortic stenosis is present. FINDINGS  Left Ventricle: Left ventricular ejection fraction, by estimation, is 60 to 65%. The left ventricle has normal function. The left ventricle has no regional wall motion abnormalities. The left ventricular internal cavity size was normal in size. There is  no left ventricular hypertrophy. Left ventricular diastolic parameters are consistent with Grade I diastolic dysfunction (impaired relaxation). Right Ventricle: The right ventricular size is normal. No increase in right ventricular wall thickness. Right ventricular systolic function is normal. There is normal pulmonary artery systolic pressure. The tricuspid regurgitant velocity is 1.65 m/s, and  with an assumed right atrial pressure of 5 mmHg, the estimated right ventricular systolic pressure is 15.9 mmHg. Left Atrium: Left atrial size was normal in size. Right Atrium: Right atrial size was normal in size. Pericardium: There is no evidence of pericardial effusion. Mitral Valve: The mitral valve is normal in structure. No evidence of mitral valve regurgitation. No evidence of mitral valve stenosis. MV peak gradient, 4.4 mmHg. The mean mitral valve gradient is 2.0 mmHg. Tricuspid Valve: The tricuspid valve is normal in structure. Tricuspid valve regurgitation is not demonstrated. No evidence of tricuspid stenosis. Aortic Valve: The aortic valve is tricuspid. Aortic valve regurgitation is not visualized. No aortic stenosis is present. Aortic valve  mean gradient measures 5.0 mmHg. Aortic valve peak gradient measures 8.0 mmHg. Aortic valve area, by VTI measures 2.57 cm. Pulmonic Valve: The pulmonic valve was normal in structure. Pulmonic

## 2023-02-21 NOTE — Progress Notes (Signed)
[facility]  ROUNDING NOTE   Subjective:   [name] was admitted to [facility] on 02/13/2023 for Shock circulatory [facility]) [R57.9] Hypotension, unspecified hypotension type [I95.9]  Patient is known to our practice and receives outpatient dialysis at [facility] on a TTS schedule, supervised by [facility] physicians.   Update: Patient resting quietly Denies pain NPWV remains in place on Rt foot   Objective:  Vital signs in last 24 hours:  Temp:  [97.8 F (36.6 C)-98.4 F (36.9 C)] 97.9 F (36.6 C) (10/30 0841) Pulse Rate:  [61-80] 61 (10/30 0841) Resp:  [12-19] 16 (10/30 0841) BP: (90-116)/(46-74) 108/64 (10/30 0841) SpO2:  [97 %-100 %] 97 % (10/30 0841) Weight:  [123.4 kg-129.5 kg] 129.5 kg (10/30 0500)  Weight change:  Filed Weights   02/20/23 0746 02/20/23 1215 02/21/23 0500  Weight: 125.6 kg 123.4 kg 129.5 kg    Intake/Output: I/O last 3 completed shifts: In: -  Out: 1000 [Other:1000]   Intake/Output this shift:  No intake/output data recorded.  Physical Exam: General: No acute distress  Head: Normocephalic, atraumatic. Moist oral mucosal membranes  Lungs:  Clear to auscultation, normal effort  Heart: S1S2 no rubs  Abdomen:  Soft, nontender, bowel sounds present  Extremities: No peripheral edema.  Neurologic: Awake, alert, following commands  Skin: No acute rash, RLE wound (NPWV)  Access: Right IJ PermCath    Basic Metabolic Panel: Recent Labs  Lab 02/15/23 0328 02/16/23 0542 02/17/23 0351 02/17/23 0844 02/18/23 0552 02/19/23 0601 02/20/23 0837  NA 139 135 137 137 138 142 139  K 4.6 3.9 6.4* 4.7 4.5 4.8 4.1  CL 103 98 101 99 99 100 99  CO2 20* 24 19* 21* 23 22 21*  GLUCOSE 105* 146* 120* 165* 164* 90 109*  BUN 57* 47* 64* 71* 58* 80* 106*  CREATININE 11.37* 10.02* 12.29* 13.75* 9.64* 11.68* 14.38*  CALCIUM 8.5* 7.8* 8.0* 7.6* 7.8* 7.6* 7.0*  MG 2.3 1.9 2.1  --   --   --   --   PHOS 4.0 3.6 2.6 2.1* 3.5 6.4* 4.6     Liver Function Tests: Recent Labs  Lab 02/17/23 0351 02/17/23 0844 02/18/23 0552 02/19/23 0601 02/20/23 0837  ALBUMIN 3.8 3.3* 3.8 4.0 3.9   No results for input(s): "LIPASE", "AMYLASE" in the last 168 hours. No results for input(s): "AMMONIA" in the last 168 hours.  CBC: Recent Labs  Lab 02/16/23 0542 02/17/23 0351 02/18/23 0552 02/19/23 0601 02/20/23 0837  WBC 5.7 7.9 7.7 7.7 9.6  HGB 10.9* 12.6* 11.5* 11.7* 10.6*  HCT 33.3* 39.4 35.4* 36.7* 32.8*  MCV 87.9 90.4 88.9 90.0 89.9  PLT 126* 146* 158 135* 160    Cardiac Enzymes: No results for input(s): "CKTOTAL", "CKMB", "CKMBINDEX", "TROPONINI" in the last 168 hours.  BNP: Invalid input(s): "POCBNP"  CBG: No results for input(s): "GLUCAP" in the last 168 hours.    Microbiology: Results for orders placed or performed during the hospital encounter of 02/13/23  MRSA Next Gen by PCR, Nasal     Status: None   Collection Time: 02/14/23  2:15 AM   Specimen: Nasal Mucosa; Nasal Swab  Result Value Ref Range Status   MRSA by PCR Next Gen NOT DETECTED NOT DETECTED Final    Comment: (NOTE) The GeneXpert MRSA Assay (FDA approved for NASAL specimens only), is one component of a comprehensive MRSA colonization surveillance program. It is not intended to diagnose MRSA infection nor to guide or monitor treatment for MRSA infections.  Test performance is not FDA approved in patients less than 5 years old. Performed at [facility], [address] [place], [place] [address]     Coagulation Studies: No results for input(s): "LABPROT", "INR" in the last 72 hours.   Urinalysis: No results for input(s): "COLORURINE", "LABSPEC", "PHURINE", "GLUCOSEU", "HGBUR", "BILIRUBINUR", "KETONESUR", "PROTEINUR", "UROBILINOGEN", "NITRITE", "LEUKOCYTESUR" in the last 72 hours.  Invalid input(s): "APPERANCEUR"    Imaging: No results found.   Medications:    [START ON 02/27/2023] cefTAZidime (FORTAZ)  IV     [START ON  02/22/2023] cefTAZidime (FORTAZ)  IV     [START ON 02/24/2023] cefTAZidime (FORTAZ)  IV      (feeding supplement) PROSource Plus  30 mL Oral TID BM   ascorbic acid  500 mg Oral BID   aspirin EC  81 mg Oral Daily   atorvastatin  40 mg Oral Daily   Chlorhexidine Gluconate Cloth  6 each Topical QHS   clopidogrel  75 mg Oral Daily   heparin  5,000 Units Subcutaneous Q8H   midodrine  5 mg Oral TID WC   multivitamin  1 tablet Oral QHS   nutrition supplement (JUVEN)  1 packet Oral BID BM   predniSONE  50 mg Oral Q breakfast   pregabalin  25 mg Oral Daily   zinc sulfate  220 mg Oral Daily   docusate sodium, heparin sodium (porcine), hydrOXYzine, mouth rinse, oxyCODONE-acetaminophen, polyethylene glycol  Assessment/ Plan:  47 y.o. male ESRD on HD TTS, CHF, history of Guillain-Barr syndrome, hypertension, anemia of chronic kidney disease, secondary hyperparathyroidism, history of right foot toe amputation who presents with ulceration on lateral aspect of right foot.  [facility]  1.  ESRD on HD TTS.  Dialysis received yesterday with 1L UF achieved. Next treatment scheduled for Thursday.   2.  Anemia of chronic kidney disease.   Lab Results  Component Value Date   HGB 10.6 (L) 02/20/2023   Patient receives Mircera as outpatient.  Hemoglobin acceptable  3.  Secondary hyperparathyroidism with outpatient labs: PTH 204, calcium 6.9, and phos 7.6 on 02/06/23  - Corrected calcium 7.1.   - Is not prescribed calcium supplementation outpatient  - Will continue to monitor  4.  Diabetes mellitus type II with chronic kidney disease and complication of right foot osteomyelitis:  -Completed ceftazidime per ID recommendations  5. Hypotension, believed due to hypovolemia and sedation. Placed in ICU with Levophed drip and midodrine.   Continue Midodrine 5mg  three times a day  6. 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 ID recommending to continue Ceftazidime with dialysis,  1g/1g/2g until 03/05/23   LOS: 7 Judeen Geralds 10/30/20249:51 AM

## 2023-02-21 NOTE — Plan of Care (Signed)
  Problem: Education: Goal: Knowledge of General Education information will improve Description: Including pain rating scale, medication(s)/side effects and non-pharmacologic comfort measures Outcome: Progressing   Problem: Health Behavior/Discharge Planning: Goal: Ability to manage health-related needs will improve Outcome: Progressing   Problem: Activity: Goal: Risk for activity intolerance will decrease Outcome: Progressing   Problem: Pain Management: Goal: General experience of comfort will improve Outcome: Progressing   Problem: Safety: Goal: Ability to remain free from injury will improve Outcome: Progressing   Problem: Skin Integrity: Goal: Risk for impaired skin integrity will decrease Outcome: Progressing

## 2023-02-21 NOTE — TOC Progression Note (Signed)
Transition of Care [facility]) - Progression Note    Patient Details  Name: Patrick Downs MRN: [id number] Date of Birth: 07-25-1975  Transition of Care [facility]) CM/SW Contact  Marlowe Sax, RN Phone Number: 02/21/2023, 10:55 AM  Clinical Narrative:     [employer] called and denied the STR stay, he will go home with [facility], I reached out to [facility] with the referral for [facility] He has a knee scooter [employer] will deliver a wound vac and he was provided a BP cuff to use for checking BP at home  Fast appeal number is [phone] if he chooses to appeal [phone] fax  Medicaid [id number]   Expected Discharge Plan: Skilled Nursing Facility Barriers to Discharge: Continued Medical Work up  Expected Discharge Plan and Services                                               Social Determinants of Health (SDOH) Interventions SDOH Screenings   Food Insecurity: No Food Insecurity (02/14/2023)  Housing: Patient Declined (02/14/2023)  Transportation Needs: No Transportation Needs (02/14/2023)  Utilities: Not At Risk (02/14/2023)  Depression (PHQ2-9): Low Risk  (06/27/2021)  Financial Resource Strain: Low Risk  (09/11/2022)   Received from [facility], [facility]  Social Connections: Unknown (09/11/2022)   Received from [facility], [facility]  Stress: No Stress Concern Present (09/11/2022)   Received from [facility], [facility]  Tobacco Use: Low Risk  (02/19/2023)    Readmission Risk Interventions    02/15/2023    3:45 PM 01/22/2023   10:18 AM  Readmission Risk Prevention Plan  Transportation Screening Complete Complete  PCP or Specialist Appt within 3-5 Days Complete Complete  HRI or Home Care Consult Complete Complete  Social Work Consult for Recovery Care Planning/Counseling Complete Patient refused  Palliative Care Screening Not Applicable Not Applicable   Medication Review Oceanographer) Complete Complete

## 2023-02-22 LAB — RENAL FUNCTION PANEL
Albumin: 3.5 g/dL (ref 3.5–5.0)
Anion gap: 16 — ABNORMAL HIGH (ref 5–15)
BUN: 113 mg/dL — ABNORMAL HIGH (ref 6–20)
CO2: 20 mmol/L — ABNORMAL LOW (ref 22–32)
Calcium: 6.4 mg/dL — CL (ref 8.9–10.3)
Chloride: 102 mmol/L (ref 98–111)
Creatinine, Ser: 12.67 mg/dL — ABNORMAL HIGH (ref 0.61–1.24)
GFR, Estimated: 4 mL/min — ABNORMAL LOW (ref >60)
Glucose, Bld: 158 mg/dL — ABNORMAL HIGH (ref 70–99)
Phosphorus: 2.8 mg/dL (ref 2.5–4.6)
Potassium: 5.2 mmol/L — ABNORMAL HIGH (ref 3.5–5.1)
Sodium: 138 mmol/L (ref 135–145)

## 2023-02-22 LAB — CBC
HCT: 27.4 % — ABNORMAL LOW (ref 39.0–52.0)
Hemoglobin: 8.9 g/dL — ABNORMAL LOW (ref 13.0–17.0)
MCH: 29.2 pg (ref 26.0–34.0)
MCHC: 32.5 g/dL (ref 30.0–36.0)
MCV: 89.8 fL (ref 80.0–100.0)
Platelets: 148 10*3/uL — ABNORMAL LOW (ref 150–400)
RBC: 3.05 MIL/uL — ABNORMAL LOW (ref 4.22–5.81)
RDW: 14.3 % (ref 11.5–15.5)
WBC: 8.3 10*3/uL (ref 4.0–10.5)
nRBC: 0 % (ref 0.0–0.2)

## 2023-02-22 MED ORDER — CALCIUM GLUCONATE-NACL 1-0.675 GM/50ML-% IV SOLN
1.0000 g | Freq: Once | INTRAVENOUS | Status: AC
  Administered 2023-02-22: 1000 mg via INTRAVENOUS
  Filled 2023-02-22: qty 50

## 2023-02-22 MED ORDER — CALCIUM CARBONATE 1250 (500 CA) MG PO TABS: 1250.0000 mg | ORAL_TABLET | Freq: Two times a day (BID) | ORAL | 0 refills | Status: DC

## 2023-02-22 MED ORDER — HEPARIN SODIUM (PORCINE) 1000 UNIT/ML IJ SOLN
INTRAMUSCULAR | Status: AC
  Filled 2023-02-22: qty 10

## 2023-02-22 MED ORDER — CALCIUM CARBONATE 1250 (500 CA) MG PO TABS
500.0000 mg | ORAL_TABLET | Freq: Two times a day (BID) | ORAL | Status: DC
  Filled 2023-02-22: qty 1

## 2023-02-22 NOTE — Plan of Care (Signed)
  Problem: Education: Goal: Knowledge of General Education information will improve Description: Including pain rating scale, medication(s)/side effects and non-pharmacologic comfort measures Outcome: Adequate for Discharge   Problem: Health Behavior/Discharge Planning: Goal: Ability to manage health-related needs will improve Outcome: Adequate for Discharge   Problem: Clinical Measurements: Goal: Ability to maintain clinical measurements within normal limits will improve Outcome: Adequate for Discharge Goal: Will remain free from infection Outcome: Adequate for Discharge Goal: Diagnostic test results will improve Outcome: Adequate for Discharge Goal: Cardiovascular complication will be avoided Outcome: Adequate for Discharge   Problem: Activity: Goal: Risk for activity intolerance will decrease Outcome: Adequate for Discharge   Problem: Coping: Goal: Level of anxiety will decrease Outcome: Adequate for Discharge   Problem: Pain Management: Goal: General experience of comfort will improve Outcome: Adequate for Discharge   Problem: Safety: Goal: Ability to remain free from injury will improve Outcome: Adequate for Discharge   Problem: Skin Integrity: Goal: Risk for impaired skin integrity will decrease Outcome: Adequate for Discharge

## 2023-02-22 NOTE — Consult Note (Signed)
WOC Nurse wound follow up Wound type: surgical  Measurement: 3.5cm x 3.0cm x 0.5cm  Wound bed:100% granulation  Drainage (amount, consistency, odor) scant, serosanguinous in VAC canister Periwound: macerated, peeling skin  Dressing procedure/placement/frequency: Removed old NPWT dressing (1) piece black  Filled wound with  1 ____ piece of black foam  Apply ostomy barrier ring to wound edge/perimeter  Sealed NPWT dressing at HG Patient received PO pain medication per bedside nurse prior to dressing change Patient tolerated procedure well Hooked patient to home unit for DC today. Supplies/ home VAC case left in patient's room in chair for DC to home with patient.   Apply skin prep to the right heel per the patients request and covered with silicone foam per the patient's request.    Rheagan Nayak [facility], CNS, CWON-AP [phone]

## 2023-02-22 NOTE — Progress Notes (Signed)
[facility]  ROUNDING NOTE   Subjective:   [name] was admitted to [facility] on 02/13/2023 for Shock circulatory [facility]) [R57.9] Hypotension, unspecified hypotension type [I95.9]  Patient is known to our practice and receives outpatient dialysis at [facility] on a TTS schedule, supervised by [facility] physicians.   Update: Patient seen and evaluated during dialysis   HEMODIALYSIS FLOWSHEET:  Blood Flow Rate (mL/min): 400 mL/min Arterial Pressure (mmHg): -170.09 mmHg Venous Pressure (mmHg): 196.35 mmHg TMP (mmHg): 18.99 mmHg Ultrafiltration Rate (mL/min): 1257 mL/min Dialysate Flow Rate (mL/min): 299 ml/min Bolus Amount (mL): 200 mL  Tolerating treatment well Requested increase in UF   Objective:  Vital signs in last 24 hours:  Temp:  [97.7 F (36.5 C)-97.9 F (36.6 C)] 97.8 F (36.6 C) (10/31 0759) Pulse Rate:  [77-100] 100 (10/31 1000) Resp:  [11-23] 14 (10/31 1000) BP: (112-153)/(31-74) 128/63 (10/31 1000) SpO2:  [92 %-100 %] 93 % (10/31 1000) Weight:  [127.8 kg-130 kg] 127.8 kg (10/31 0759)  Weight change: 4.4 kg Filed Weights   02/21/23 0500 02/22/23 0500 02/22/23 0759  Weight: 129.5 kg 130 kg 127.8 kg    Intake/Output: No intake/output data recorded.   Intake/Output this shift:  No intake/output data recorded.  Physical Exam: General: No acute distress  Head: Normocephalic, atraumatic. Moist oral mucosal membranes  Lungs:  Clear to auscultation, normal effort  Heart: S1S2 no rubs  Abdomen:  Soft, nontender, bowel sounds present  Extremities: No peripheral edema.  Neurologic: Awake, alert, following commands  Skin: No acute rash, RLE wound (NPWV)  Access: Right IJ PermCath    Basic Metabolic Panel: Recent Labs  Lab 02/16/23 0542 02/17/23 0351 02/17/23 0844 02/18/23 0552 02/19/23 0601 02/20/23 0837 02/22/23 0819  NA 135 137 137 138 142 139 138  K 3.9 6.4* 4.7 4.5 4.8 4.1 5.2*  CL 98 101 99 99 100 99 102  CO2 24 19*  21* 23 22 21* 20*  GLUCOSE 146* 120* 165* 164* 90 109* 158*  BUN 47* 64* 71* 58* 80* 106* 113*  CREATININE 10.02* 12.29* 13.75* 9.64* 11.68* 14.38* 12.67*  CALCIUM 7.8* 8.0* 7.6* 7.8* 7.6* 7.0* 6.4*  MG 1.9 2.1  --   --   --   --   --   PHOS 3.6 2.6 2.1* 3.5 6.4* 4.6 2.8    Liver Function Tests: Recent Labs  Lab 02/17/23 0844 02/18/23 0552 02/19/23 0601 02/20/23 0837 02/22/23 0819  ALBUMIN 3.3* 3.8 4.0 3.9 3.5   No results for input(s): "LIPASE", "AMYLASE" in the last 168 hours. No results for input(s): "AMMONIA" in the last 168 hours.  CBC: Recent Labs  Lab 02/17/23 0351 02/18/23 0552 02/19/23 0601 02/20/23 0837 02/22/23 0819  WBC 7.9 7.7 7.7 9.6 8.3  HGB 12.6* 11.5* 11.7* 10.6* 8.9*  HCT 39.4 35.4* 36.7* 32.8* 27.4*  MCV 90.4 88.9 90.0 89.9 89.8  PLT 146* 158 135* 160 148*    Cardiac Enzymes: No results for input(s): "CKTOTAL", "CKMB", "CKMBINDEX", "TROPONINI" in the last 168 hours.  BNP: Invalid input(s): "POCBNP"  CBG: No results for input(s): "GLUCAP" in the last 168 hours.    Microbiology: Results for orders placed or performed during the hospital encounter of 02/13/23  MRSA Next Gen by PCR, Nasal     Status: None   Collection Time: 02/14/23  2:15 AM   Specimen: Nasal Mucosa; Nasal Swab  Result Value Ref Range Status   MRSA by PCR Next Gen NOT DETECTED NOT DETECTED Final  Comment: (NOTE) The GeneXpert MRSA Assay (FDA approved for NASAL specimens only), is one component of a comprehensive MRSA colonization surveillance program. It is not intended to diagnose MRSA infection nor to guide or monitor treatment for MRSA infections. Test performance is not FDA approved in patients less than 56 years old. Performed at [facility], [address] [place], [place] [address]     Coagulation Studies: No results for input(s): "LABPROT", "INR" in the last 72 hours.   Urinalysis: No results for input(s): "COLORURINE", "LABSPEC", "PHURINE",  "GLUCOSEU", "HGBUR", "BILIRUBINUR", "KETONESUR", "PROTEINUR", "UROBILINOGEN", "NITRITE", "LEUKOCYTESUR" in the last 72 hours.  Invalid input(s): "APPERANCEUR"    Imaging: No results found.   Medications:    calcium gluconate     [START ON 02/27/2023] cefTAZidime (FORTAZ)  IV     cefTAZidime (FORTAZ)  IV     [START ON 02/24/2023] cefTAZidime (FORTAZ)  IV      (feeding supplement) PROSource Plus  30 mL Oral TID BM   ascorbic acid  500 mg Oral BID   aspirin EC  81 mg Oral Daily   atorvastatin  40 mg Oral Daily   calcium carbonate  500 mg of elemental calcium Oral BID WC   Chlorhexidine Gluconate Cloth  6 each Topical QHS   clopidogrel  75 mg Oral Daily   heparin  5,000 Units Subcutaneous Q8H   midodrine  5 mg Oral TID WC   multivitamin  1 tablet Oral QHS   nutrition supplement (JUVEN)  1 packet Oral BID BM   predniSONE  50 mg Oral Q breakfast   pregabalin  25 mg Oral Daily   zinc sulfate  220 mg Oral Daily   docusate sodium, heparin sodium (porcine), hydrOXYzine, mouth rinse, oxyCODONE-acetaminophen, polyethylene glycol  Assessment/ Plan:  47 y.o. male ESRD on HD TTS, CHF, history of Guillain-Barr syndrome, hypertension, anemia of chronic kidney disease, secondary hyperparathyroidism, history of right foot toe amputation who presents with ulceration on lateral aspect of right foot.  [facility]  1.  ESRD on HD TTS.  Receiving dialysis and requested increased UF to 3L Intermittent hypotension, but recovered without interventions. Next treatment scheduled for Saturday  2.  Anemia of chronic kidney disease.   Lab Results  Component Value Date   HGB 8.9 (L) 02/22/2023   Patient receives Mircera as outpatient.  Hemoglobin borderline.   3.  Secondary hyperparathyroidism with outpatient labs: PTH 204, calcium 6.9, and phos 7.6 on 02/06/23. Chronic hyponatremia  - Is not prescribed calcium supplementation outpatient  - Will correct slowly with  dialysis  4.  Diabetes mellitus type II with chronic kidney disease and complication of right foot osteomyelitis:  -Completed ceftazidime per ID recommendations  5. Hypotension, believed due to hypovolemia and sedation. Placed in ICU with Levophed drip and midodrine.   Continue Midodrine 5mg  three times a day  Blood pressure stable  6. 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 ID recommending to continue Ceftazidime with dialysis, 1g/1g/2g until 03/05/23   LOS: 8 Anjalee Cope 10/31/202410:43 AM

## 2023-02-22 NOTE — Progress Notes (Signed)
  Received patient in bed to unit.   Informed consent signed and in chart.    TX duration: 3.5hrs     Transported by  Hand-off given to patient's nurse. No distress no c/o   Access used: R HD catheter  Access issues: none   Total UF removed: 3.5L Medication(s) given: none Post HD VS: 152/81 Post HD weight: 125.5KGS     Lynann Beaver  [facility]

## 2023-02-22 NOTE — Progress Notes (Signed)
Patient was discharged yesterday but unable to leave as wound VAC equipment was delivered late.  He received his routine dialysis this morning and will be leaving after completing dialysis and need to follow-up with his providers for further recommendations.

## 2023-02-27 LAB — MISC LABCORP TEST (SEND OUT)
LabCorp test name: 182261
Labcorp test code: 182261

## 2023-04-23 ENCOUNTER — Ambulatory Visit: Payer: 59 | Attending: Anesthesiology | Admitting: Anesthesiology

## 2023-04-23 ENCOUNTER — Encounter: Payer: Self-pay | Admitting: Anesthesiology

## 2023-04-23 DIAGNOSIS — G894 Chronic pain syndrome: Secondary | ICD-10-CM

## 2023-04-23 DIAGNOSIS — N186 End stage renal disease: Secondary | ICD-10-CM

## 2023-04-23 DIAGNOSIS — M545 Low back pain, unspecified: Secondary | ICD-10-CM

## 2023-04-23 DIAGNOSIS — F1190 Opioid use, unspecified, uncomplicated: Secondary | ICD-10-CM

## 2023-04-23 DIAGNOSIS — G610 Guillain-Barre syndrome: Secondary | ICD-10-CM

## 2023-04-23 DIAGNOSIS — R29898 Other symptoms and signs involving the musculoskeletal system: Secondary | ICD-10-CM

## 2023-04-23 DIAGNOSIS — G8929 Other chronic pain: Secondary | ICD-10-CM

## 2023-04-23 DIAGNOSIS — M47816 Spondylosis without myelopathy or radiculopathy, lumbar region: Secondary | ICD-10-CM

## 2023-04-23 MED ORDER — OXYCODONE-ACETAMINOPHEN 10-325 MG PO TABS: 1.0000 | ORAL_TABLET | ORAL | 0 refills | Status: DC | PRN

## 2023-04-23 MED ORDER — OXYCODONE-ACETAMINOPHEN 10-325 MG PO TABS: 1.0000 | ORAL_TABLET | ORAL | 0 refills | Status: AC | PRN

## 2023-05-16 ENCOUNTER — Ambulatory Visit: Payer: 59 | Attending: Anesthesiology | Admitting: Anesthesiology

## 2023-05-16 ENCOUNTER — Encounter: Payer: Self-pay | Admitting: Anesthesiology

## 2023-05-16 ENCOUNTER — Other Ambulatory Visit
Admission: RE | Admit: 2023-05-16 | Discharge: 2023-05-16 | Disposition: A | Discharge status: Home / Self Care | Payer: 59 | Source: Ambulatory Visit | Attending: Anesthesiology | Admitting: Anesthesiology

## 2023-05-16 VITALS — BP 111/76 | HR 53 | Temp 97.8°F | Resp 16 | Ht 74.0 in | Wt 275.0 lb

## 2023-05-16 DIAGNOSIS — M47816 Spondylosis without myelopathy or radiculopathy, lumbar region: Secondary | ICD-10-CM | POA: Diagnosis present

## 2023-05-16 DIAGNOSIS — Z992 Dependence on renal dialysis: Secondary | ICD-10-CM | POA: Diagnosis present

## 2023-05-16 DIAGNOSIS — R29898 Other symptoms and signs involving the musculoskeletal system: Secondary | ICD-10-CM | POA: Insufficient documentation

## 2023-05-16 DIAGNOSIS — G894 Chronic pain syndrome: Secondary | ICD-10-CM | POA: Diagnosis present

## 2023-05-16 DIAGNOSIS — M545 Low back pain, unspecified: Secondary | ICD-10-CM | POA: Insufficient documentation

## 2023-05-16 DIAGNOSIS — G8929 Other chronic pain: Secondary | ICD-10-CM | POA: Insufficient documentation

## 2023-05-16 DIAGNOSIS — N186 End stage renal disease: Secondary | ICD-10-CM | POA: Diagnosis present

## 2023-05-16 DIAGNOSIS — Z79891 Long term (current) use of opiate analgesic: Secondary | ICD-10-CM

## 2023-05-16 DIAGNOSIS — G610 Guillain-Barre syndrome: Secondary | ICD-10-CM | POA: Insufficient documentation

## 2023-05-16 DIAGNOSIS — F1190 Opioid use, unspecified, uncomplicated: Secondary | ICD-10-CM | POA: Insufficient documentation

## 2023-05-16 MED ORDER — OXYCODONE-ACETAMINOPHEN 10-325 MG PO TABS: 1.0000 | ORAL_TABLET | ORAL | 0 refills | Status: DC | PRN

## 2023-05-16 NOTE — Patient Instructions (Signed)

## 2023-05-16 NOTE — Progress Notes (Signed)
Nursing Pain Medication Assessment:  Safety precautions to be maintained throughout the outpatient stay will include: orient to surroundings, keep bed in low position, maintain call bell within reach at all times, provide assistance with transfer out of bed and ambulation.  Medication Inspection Compliance: Patrick Downs did not comply with our request to bring his pills to be counted. He was reminded that bringing the medication bottles, even when empty, is a requirement.  Medication: None brought in. Pill/Patch Count: None available to be counted. Bottle Appearance: No container available. Did not bring bottle(s) to appointment. Filled Date: N/A Last Medication intake:  TodaySafety precautions to be maintained throughout the outpatient stay will include: orient to surroundings, keep bed in low position, maintain call bell within reach at all times, provide assistance with transfer out of bed and ambulation.

## 2023-05-16 NOTE — Progress Notes (Signed)
Subjective:  Patient ID: Patrick Downs, male    DOB: May 21, 1975  Age: 48 y.o. MRN: [id number]  CC: Back Pain (lower)   Procedure: None  HPI Patrick Downs presents for reevaluation.  Patrick Downs was last seen 2 months ago and is continue to have stable low back pain.  He experiences in intractable pain with radiation into the bilateral buttocks and down the legs.  The pain that he is describing has been persistent with sciatica-like symptoms affecting both lower extremities with associated calf cramping.  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  He also suffers from end-stage renal disease.  He takes Percocet tablets about every 4 hours that help keep his pain under control and he has been on this chronically with good relief.  No side effects with the medication are noted.  He denies any diverting or illicit use.  The medications enable him to stay active and functional and sleep better at night.  He has failed more conservative therapy cannot take anti-inflammatory medications and has had no significant relief from physical therapy or more conservative measures.  Otherwise he is in his usual state of health today.  Outpatient Medications Prior to Visit  Medication Sig Dispense Refill   acetaminophen (TYLENOL) 500 MG tablet Take 1,000-1,500 mg by mouth 2 (two) times daily as needed for moderate pain or headache.     calcium acetate (PHOSLO) 667 MG capsule Take 667 mg by mouth 3 (three) times daily.     Cholecalciferol (VITAMIN D-1000 MAX ST) 25 MCG (1000 UT) tablet Take by mouth.     hydrOXYzine (ATARAX) 25 MG tablet Take 1 tablet by mouth 2 (two) times daily.     midodrine (PROAMATINE) 5 MG tablet Take 1 tablet (5 mg total) by mouth 3 (three) times daily with meals as needed (For systolic below 120). Check your blood pressure before taking, take for blood pressure less than 120 90 tablet 1   multivitamin (RENA-VIT) TABS tablet Take 1 tablet by mouth at  bedtime. 30 tablet 2   [START ON 05/23/2023] oxyCODONE-acetaminophen (PERCOCET) 10-325 MG tablet Take 1 tablet by mouth every 4 (four) hours as needed for pain. 135 tablet 0   pregabalin (LYRICA) 25 MG capsule Take 25 mg by mouth 2 (two) times daily as needed.     sildenafil (VIAGRA) 100 MG tablet Take 100 mg by mouth daily as needed for erectile dysfunction.     oxyCODONE-acetaminophen (PERCOCET) 10-325 MG tablet Take 1 tablet by mouth every 4 (four) hours as needed for pain. 135 tablet 0   ascorbic acid (VITAMIN C) 500 MG tablet Take 1 tablet (500 mg total) by mouth 2 (two) times daily. (Patient not taking: Reported on 05/16/2023)     aspirin EC 81 MG tablet Take 1 tablet (81 mg total) by mouth daily. Swallow whole. (Patient not taking: Reported on 05/16/2023)     atorvastatin (LIPITOR) 40 MG tablet Take 1 tablet (40 mg total) by mouth daily. (Patient not taking: Reported on 05/16/2023) 30 tablet 2   calcium carbonate (OS-CAL - DOSED IN MG OF ELEMENTAL CALCIUM) 1250 (500 Ca) MG tablet Take 1 tablet (1,250 mg total) by mouth 2 (two) times daily with a meal. (Patient not taking: Reported on 05/16/2023) 60 tablet 0   clopidogrel (PLAVIX) 75 MG tablet Take 1 tablet (75 mg total) by mouth daily. (Patient not taking: Reported on 05/16/2023) 30 tablet 2   docusate sodium (COLACE) 100 MG capsule Take 1  capsule (100 mg total) by mouth 2 (two) times daily as needed for mild constipation. (Patient not taking: Reported on 05/16/2023) 60 capsule 0   nutrition supplement, JUVEN, (JUVEN) PACK Take 1 packet by mouth 2 (two) times daily between meals. (Patient not taking: Reported on 05/16/2023) 30 each 1   Nutritional Supplements (,FEEDING SUPPLEMENT, PROSOURCE PLUS) liquid Take 30 mLs by mouth 3 (three) times daily between meals. (Patient not taking: Reported on 05/16/2023) 887 mL 1   oxyCODONE-acetaminophen (PERCOCET/ROXICET) 5-325 MG tablet Take 1-2 tablets by mouth every 4 (four) hours as needed for moderate pain (pain  score 4-6). (Patient not taking: Reported on 05/16/2023) 30 tablet 0   polyethylene glycol (MIRALAX / GLYCOLAX) 17 g packet Take 17 g by mouth daily as needed for moderate constipation. (Patient not taking: Reported on 05/16/2023) 14 each 0   zinc sulfate 220 (50 Zn) MG capsule Take 1 capsule (220 mg total) by mouth daily. (Patient not taking: Reported on 05/16/2023)     No facility-administered medications prior to visit.    Review of Systems CNS: No confusion or sedation Cardiac: No angina or palpitations GI: No abdominal pain or constipation Constitutional: No nausea vomiting fevers or chills  Objective:  BP 111/76   Pulse (!) 53   Temp 97.8 F (36.6 C) (Temporal)   Resp 16   Ht 6\' 2"  (1.88 m)   Wt 275 lb (124.7 kg)   SpO2 100%   BMI 35.31 kg/m    BP Readings from Last 3 Encounters:  05/16/23 111/76  02/22/23 (!) 144/97  02/02/23 117/67     Wt Readings from Last 3 Encounters:  05/16/23 275 lb (124.7 kg)  02/22/23 276 lb 10.8 oz (125.5 kg)  02/01/23 275 lb 9.2 oz (125 kg)     Physical Exam Pt is alert and oriented PERRL EOMI HEART IS RRR no murmur or rub LCTA no wheezing or rales MUSCULOSKELETAL reveals some paraspinous muscle tenderness with 1 trigger point in the left and right lower mid body lumbar region in proximity to the posterior superior iliac crest.  Labs  Lab Results  Component Value Date   HGBA1C 6.0 (H) 01/21/2023   HGBA1C 5.4 05/14/2020   HGBA1C 5.7 (H) 01/26/2020   Lab Results  Component Value Date   LDLCALC 111 (H) 01/26/2020   CREATININE 12.67 (H) 02/22/2023    -------------------------------------------------------------------------------------------------------------------- Lab Results  Component Value Date   WBC 8.3 02/22/2023   HGB 8.9 (L) 02/22/2023   HCT 27.4 (L) 02/22/2023   PLT 148 (L) 02/22/2023   GLUCOSE 158 (H) 02/22/2023   CHOL 162 01/26/2020   TRIG 99 01/26/2020   HDL 31 (L) 01/26/2020   LDLCALC 111 (H) 01/26/2020    ALT 30 02/13/2023   AST 27 02/13/2023   NA 138 02/22/2023   K 5.2 (H) 02/22/2023   CL 102 02/22/2023   CREATININE 12.67 (H) 02/22/2023   BUN 113 (H) 02/22/2023   CO2 20 (L) 02/22/2023   TSH 2.932 08/08/2019   INR 1.2 01/21/2023   HGBA1C 6.0 (H) 01/21/2023    --------------------------------------------------------------------------------------------------------------------- ECHOCARDIOGRAM COMPLETE Result Date: 02/14/2023    ECHOCARDIOGRAM REPORT   Patient Name:   Patrick Downs Date of Exam: 02/14/2023 Medical Rec #:  [id number]       Height:       74.0 in Accession #:    [id number]      Weight:       262.8 lb Date of Birth:  24-Jun-1975  BSA:          2.441 m Patient Age:    47 years        BP:           83/49 mmHg Patient Gender: M               HR:           74 bpm. Exam Location:  [facility] Procedure: 2D Echo, Cardiac Doppler and Color Doppler STAT ECHO Indications:     Shock R57.9  History:         Patient has prior history of Echocardiogram examinations, most                  recent 01/22/2018. CHF, Signs/Symptoms:Murmur; Risk                  Factors:Hypertension. ESRD.  Sonographer:     Cristela Blue Referring Phys:  [id number] KHABIB DGAYLI Diagnosing Phys: Julien Nordmann MD IMPRESSIONS  1. Left ventricular ejection fraction, by estimation, is 60 to 65%. The left ventricle has normal function. The left ventricle has no regional wall motion abnormalities. Left ventricular diastolic parameters are consistent with Grade I diastolic dysfunction (impaired relaxation).  2. Right ventricular systolic function is normal. The right ventricular size is normal. There is normal pulmonary artery systolic pressure. The estimated right ventricular systolic pressure is 15.9 mmHg.  3. The mitral valve is normal in structure. No evidence of mitral valve regurgitation. No evidence of mitral stenosis.  4. The aortic valve is tricuspid. Aortic valve regurgitation is not visualized. No aortic stenosis is present.  FINDINGS  Left Ventricle: Left ventricular ejection fraction, by estimation, is 60 to 65%. The left ventricle has normal function. The left ventricle has no regional wall motion abnormalities. The left ventricular internal cavity size was normal in size. There is  no left ventricular hypertrophy. Left ventricular diastolic parameters are consistent with Grade I diastolic dysfunction (impaired relaxation). Right Ventricle: The right ventricular size is normal. No increase in right ventricular wall thickness. Right ventricular systolic function is normal. There is normal pulmonary artery systolic pressure. The tricuspid regurgitant velocity is 1.65 m/s, and  with an assumed right atrial pressure of 5 mmHg, the estimated right ventricular systolic pressure is 15.9 mmHg. Left Atrium: Left atrial size was normal in size. Right Atrium: Right atrial size was normal in size. Pericardium: There is no evidence of pericardial effusion. Mitral Valve: The mitral valve is normal in structure. No evidence of mitral valve regurgitation. No evidence of mitral valve stenosis. MV peak gradient, 4.4 mmHg. The mean mitral valve gradient is 2.0 mmHg. Tricuspid Valve: The tricuspid valve is normal in structure. Tricuspid valve regurgitation is not demonstrated. No evidence of tricuspid stenosis. Aortic Valve: The aortic valve is tricuspid. Aortic valve regurgitation is not visualized. No aortic stenosis is present. Aortic valve mean gradient measures 5.0 mmHg. Aortic valve peak gradient measures 8.0 mmHg. Aortic valve area, by VTI measures 2.57 cm. Pulmonic Valve: The pulmonic valve was normal in structure. Pulmonic valve regurgitation is not visualized. No evidence of pulmonic stenosis. Aorta: The aortic root is normal in size and structure. Venous: The inferior vena cava was not well visualized. The inferior vena cava is normal in size with greater than 50% respiratory variability, suggesting right atrial pressure of 3 mmHg.  IAS/Shunts: No atrial level shunt detected by color flow Doppler.  LEFT VENTRICLE PLAX 2D LVIDd:         4.40 cm  Diastology LVIDs:         3.20 cm   LV e' medial:    9.14 cm/s LV PW:         1.10 cm   LV E/e' medial:  6.7 LV IVS:        1.50 cm   LV e' lateral:   15.20 cm/s LVOT diam:     2.00 cm   LV E/e' lateral: 4.0 LV SV:         80 LV SV Index:   33 LVOT Area:     3.14 cm  RIGHT VENTRICLE RV Basal diam:  3.70 cm RV Mid diam:    3.30 cm LEFT ATRIUM             Index        RIGHT ATRIUM           Index LA diam:        4.00 cm 1.64 cm/m   RA Area:     13.20 cm LA Vol (A2C):   36.3 ml 14.87 ml/m  RA Volume:   31.70 ml  12.99 ml/m LA Vol (A4C):   31.8 ml 13.03 ml/m LA Biplane Vol: 33.8 ml 13.85 ml/m  AORTIC VALVE AV Area (Vmax):    2.21 cm AV Area (Vmean):   2.40 cm AV Area (VTI):     2.57 cm AV Vmax:           141.00 cm/s AV Vmean:          98.300 cm/s AV VTI:            0.311 m AV Peak Grad:      8.0 mmHg AV Mean Grad:      5.0 mmHg LVOT Vmax:         99.00 cm/s LVOT Vmean:        75.200 cm/s LVOT VTI:          0.254 m LVOT/AV VTI ratio: 0.82  AORTA Ao Root diam: 3.20 cm MITRAL VALVE               TRICUSPID VALVE MV Area (PHT): 2.22 cm    TR Peak grad:   10.9 mmHg MV Area VTI:   2.69 cm    TR Vmax:        165.00 cm/s MV Peak grad:  4.4 mmHg MV Mean grad:  2.0 mmHg    SHUNTS MV Vmax:       1.05 m/s    Systemic VTI:  0.25 m MV Vmean:      61.0 cm/s   Systemic Diam: 2.00 cm MV Decel Time: 341 msec MV E velocity: 60.80 cm/s MV A velocity: 86.50 cm/s MV E/A ratio:  0.70 Julien Nordmann MD Electronically signed by Julien Nordmann MD Signature Date/Time: 02/14/2023/2:39:30 PM    Final      Assessment & Plan:   Alezander was seen today for back pain.  Diagnoses and all orders for this visit:  Chronic pain syndrome -     Drug Screen 10 W/Conf, Serum  Chronic, continuous use of opioids -     Drug Screen 10 W/Conf, Serum  Facet arthritis of lumbar region  Guillain-Barre syndrome (HCC)  Weakness  of both lower extremities  ESRD on dialysis (HCC)  Chronic bilateral low back pain without sciatica  Other orders -     oxyCODONE-acetaminophen (PERCOCET) 10-325 MG tablet; Take 1 tablet by mouth every 4 (four) hours as needed for pain.        ----------------------------------------------------------------------------------------------------------------------  Problem List Items Addressed This Visit       Unprioritized   Peripheral neuropathy   Other Visit Diagnoses       Chronic pain syndrome    -  Primary   Relevant Medications   oxyCODONE-acetaminophen (PERCOCET) 10-325 MG tablet (Start on 06/22/2023)   Other Relevant Orders   Drug Screen 10 W/Conf, Serum     Chronic, continuous use of opioids       Relevant Orders   Drug Screen 10 W/Conf, Serum     Facet arthritis of lumbar region       Relevant Medications   oxyCODONE-acetaminophen (PERCOCET) 10-325 MG tablet (Start on 06/22/2023)     Weakness of both lower extremities         ESRD on dialysis [facility])         Chronic bilateral low back pain without sciatica       Relevant Medications   oxyCODONE-acetaminophen (PERCOCET) 10-325 MG tablet (Start on 06/22/2023)         ----------------------------------------------------------------------------------------------------------------------  1. Chronic pain syndrome (Primary) I have reviewed the [facility] practitioner database information and it is appropriate for refill.  These be generated for the next 2 months.  Will keep him on the same medication regimen.  He is doing well with this showing good functional improvement with chronic opioid therapy.  No side effects reported.  Unfortunately he has failed more conservative therapy but this seems to keep him active and functional.  Continue core stretching exercises as tolerated and we will schedule him for return to clinic in 2 months. - Drug Screen 10 W/Conf, Serum  2. Chronic, continuous use of opioids As above -  Drug Screen 10 W/Conf, Serum  3. Facet arthritis of lumbar region   4. Guillain-Barre syndrome (HCC)   5. Weakness of both lower extremities   6. ESRD on dialysis [facility]) Continue follow-up with primary care physicians with serum drug screen for routine evaluation today.  7. Chronic bilateral low back pain without sciatica     ----------------------------------------------------------------------------------------------------------------------  I am having Patrick Downs maintain his acetaminophen, hydrOXYzine, sildenafil, pregabalin, (feeding supplement) PROSource Plus, nutrition supplement (JUVEN), ascorbic acid, multivitamin, zinc sulfate (50mg  elemental zinc), clopidogrel, aspirin EC, atorvastatin, oxyCODONE-acetaminophen, midodrine, docusate sodium, polyethylene glycol, calcium carbonate, oxyCODONE-acetaminophen, calcium acetate, Vitamin D-1000 Max St, and oxyCODONE-acetaminophen.   Meds ordered this encounter  Medications   oxyCODONE-acetaminophen (PERCOCET) 10-325 MG tablet    Sig: Take 1 tablet by mouth every 4 (four) hours as needed for pain.    Dispense:  135 tablet    Refill:  0   Patient's Medications  New Prescriptions   No medications on file  Previous Medications   ACETAMINOPHEN (TYLENOL) 500 MG TABLET    Take 1,000-1,500 mg by mouth 2 (two) times daily as needed for moderate pain or headache.   ASCORBIC ACID (VITAMIN C) 500 MG TABLET    Take 1 tablet (500 mg total) by mouth 2 (two) times daily.   ASPIRIN EC 81 MG TABLET    Take 1 tablet (81 mg total) by mouth daily. Swallow whole.   ATORVASTATIN (LIPITOR) 40 MG TABLET    Take 1 tablet (40 mg total) by mouth daily.   CALCIUM ACETATE (PHOSLO) 667 MG CAPSULE    Take 667 mg by mouth 3 (three) times daily.   CALCIUM CARBONATE (OS-CAL - DOSED IN MG OF ELEMENTAL CALCIUM) 1250 (500 CA) MG TABLET    Take 1 tablet (1,250 mg total) by mouth 2 (two)  times daily with a meal.   CHOLECALCIFEROL (VITAMIN D-1000 MAX ST) 25 MCG  (1000 UT) TABLET    Take by mouth.   CLOPIDOGREL (PLAVIX) 75 MG TABLET    Take 1 tablet (75 mg total) by mouth daily.   DOCUSATE SODIUM (COLACE) 100 MG CAPSULE    Take 1 capsule (100 mg total) by mouth 2 (two) times daily as needed for mild constipation.   HYDROXYZINE (ATARAX) 25 MG TABLET    Take 1 tablet by mouth 2 (two) times daily.   MIDODRINE (PROAMATINE) 5 MG TABLET    Take 1 tablet (5 mg total) by mouth 3 (three) times daily with meals as needed (For systolic below 120). Check your blood pressure before taking, take for blood pressure less than 120   MULTIVITAMIN (RENA-VIT) TABS TABLET    Take 1 tablet by mouth at bedtime.   NUTRITION SUPPLEMENT, JUVEN, (JUVEN) PACK    Take 1 packet by mouth 2 (two) times daily between meals.   NUTRITIONAL SUPPLEMENTS (,FEEDING SUPPLEMENT, PROSOURCE PLUS) LIQUID    Take 30 mLs by mouth 3 (three) times daily between meals.   OXYCODONE-ACETAMINOPHEN (PERCOCET) 10-325 MG TABLET    Take 1 tablet by mouth every 4 (four) hours as needed for pain.   OXYCODONE-ACETAMINOPHEN (PERCOCET/ROXICET) 5-325 MG TABLET    Take 1-2 tablets by mouth every 4 (four) hours as needed for moderate pain (pain score 4-6).   POLYETHYLENE GLYCOL (MIRALAX / GLYCOLAX) 17 G PACKET    Take 17 g by mouth daily as needed for moderate constipation.   PREGABALIN (LYRICA) 25 MG CAPSULE    Take 25 mg by mouth 2 (two) times daily as needed.   SILDENAFIL (VIAGRA) 100 MG TABLET    Take 100 mg by mouth daily as needed for erectile dysfunction.   ZINC SULFATE 220 (50 ZN) MG CAPSULE    Take 1 capsule (220 mg total) by mouth daily.  Modified Medications   Modified Medication Previous Medication   OXYCODONE-ACETAMINOPHEN (PERCOCET) 10-325 MG TABLET oxyCODONE-acetaminophen (PERCOCET) 10-325 MG tablet      Take 1 tablet by mouth every 4 (four) hours as needed for pain.    Take 1 tablet by mouth every 4 (four) hours as needed for pain.  Discontinued Medications   No medications on file    ----------------------------------------------------------------------------------------------------------------------  Follow-up: Return in about 2 months (around 07/14/2023) for evaluation, med refill.    Yevette Edwards, MD

## 2023-05-24 LAB — OXYCODONES,MS,WB/SP RFX
Oxycocone: 10.3 ng/mL
Oxycodones Confirmation: POSITIVE
Oxymorphone: NEGATIVE ng/mL

## 2023-05-24 LAB — DRUG SCREEN 10 W/CONF, SERUM
Amphetamines, IA: NEGATIVE ng/mL
Barbiturates, IA: NEGATIVE ug/mL
Benzodiazepines, IA: NEGATIVE ng/mL
Cocaine & Metabolite, IA: NEGATIVE ng/mL
Methadone, IA: NEGATIVE ng/mL
Opiates, IA: NEGATIVE ng/mL
Oxycodones, IA: POSITIVE ng/mL — AB
Phencyclidine, IA: NEGATIVE ng/mL
Propoxyphene, IA: NEGATIVE ng/mL
THC(Marijuana) Metabolite, IA: NEGATIVE ng/mL

## 2023-05-24 LAB — OPIATES,MS,WB/SP RFX
6-Acetylmorphine: NEGATIVE
Codeine: NEGATIVE ng/mL
Dihydrocodeine: NEGATIVE ng/mL
Hydrocodone: NEGATIVE ng/mL
Hydromorphone: NEGATIVE ng/mL
Morphine: NEGATIVE ng/mL
Opiate Confirmation: NEGATIVE

## 2023-07-10 ENCOUNTER — Ambulatory Visit: Payer: 59 | Attending: Anesthesiology | Admitting: Anesthesiology

## 2023-07-10 ENCOUNTER — Encounter: Payer: Self-pay | Admitting: Anesthesiology

## 2023-07-10 DIAGNOSIS — N186 End stage renal disease: Secondary | ICD-10-CM

## 2023-07-10 DIAGNOSIS — G894 Chronic pain syndrome: Secondary | ICD-10-CM | POA: Diagnosis not present

## 2023-07-10 DIAGNOSIS — F1190 Opioid use, unspecified, uncomplicated: Secondary | ICD-10-CM

## 2023-07-10 DIAGNOSIS — M545 Low back pain, unspecified: Secondary | ICD-10-CM

## 2023-07-10 DIAGNOSIS — Z79891 Long term (current) use of opiate analgesic: Secondary | ICD-10-CM

## 2023-07-10 DIAGNOSIS — R29898 Other symptoms and signs involving the musculoskeletal system: Secondary | ICD-10-CM | POA: Diagnosis not present

## 2023-07-10 DIAGNOSIS — M47816 Spondylosis without myelopathy or radiculopathy, lumbar region: Secondary | ICD-10-CM

## 2023-07-10 DIAGNOSIS — G610 Guillain-Barre syndrome: Secondary | ICD-10-CM | POA: Diagnosis not present

## 2023-07-10 DIAGNOSIS — M25552 Pain in left hip: Secondary | ICD-10-CM

## 2023-07-10 DIAGNOSIS — Z992 Dependence on renal dialysis: Secondary | ICD-10-CM

## 2023-07-10 DIAGNOSIS — M5136 Other intervertebral disc degeneration, lumbar region with discogenic back pain only: Secondary | ICD-10-CM

## 2023-07-10 DIAGNOSIS — G8929 Other chronic pain: Secondary | ICD-10-CM

## 2023-07-10 MED ORDER — OXYCODONE-ACETAMINOPHEN 10-325 MG PO TABS: 1.0000 | ORAL_TABLET | ORAL | 0 refills | Status: DC | PRN

## 2023-07-10 MED ORDER — OXYCODONE-ACETAMINOPHEN 10-325 MG PO TABS: 1.0000 | ORAL_TABLET | ORAL | 0 refills | Status: AC | PRN

## 2023-07-10 NOTE — Progress Notes (Signed)
 Virtual Visit via Telephone Note  I connected with Patrick Downs on 07/10/23 at  1:00 PM EDT by telephone and verified that I am speaking with the correct person using two identifiers.  Location: Patient: Home Provider: Pain control center   I discussed the limitations, risks, security and privacy concerns of performing an evaluation and management service by telephone and the availability of in person appointments. I also discussed with the patient that there may be a patient responsible charge related to this service. The patient expressed understanding and agreed to proceed.   History of Present Illness: I spoke with Patrick Downs regarding his low back pain and diffuse body pain.  We did this via telephone as he could not link for the video portion of the conference.  He was last seen in January and is about the same as before.  No other changes in the quality characteristic or distribution of his low back pain or diffuse pain are noted.  He still taking his oxycodone 10/3/202 every 4 hours for pain relief.  He has failed more conservative therapy but this regimen is working well for him as he reports today.  With the medications he is able to fulfill daily activity requirements and sleep better at night and is without side effect.  No problems with constipation or sedation are noted.  He continues on dialysis for his end-stage renal disease.  Otherwise lower extremity strength function is stable  Review of systems: General: No fevers or chills Pulmonary: No shortness of breath or dyspnea Cardiac: No angina or palpitations or lightheadedness GI: No abdominal pain or constipation Psych: No depression    Observations/Objective:  Current Outpatient Medications:    [START ON 08/21/2023] oxyCODONE-acetaminophen (PERCOCET) 10-325 MG tablet, Take 1 tablet by mouth every 4 (four) hours as needed for pain., Disp: 135 tablet, Rfl: 0   acetaminophen (TYLENOL) 500 MG tablet, Take 1,000-1,500 mg by  mouth 2 (two) times daily as needed for moderate pain or headache., Disp: , Rfl:    ascorbic acid (VITAMIN C) 500 MG tablet, Take 1 tablet (500 mg total) by mouth 2 (two) times daily. (Patient not taking: Reported on 05/16/2023), Disp: , Rfl:    aspirin EC 81 MG tablet, Take 1 tablet (81 mg total) by mouth daily. Swallow whole. (Patient not taking: Reported on 05/16/2023), Disp: , Rfl:    atorvastatin (LIPITOR) 40 MG tablet, Take 1 tablet (40 mg total) by mouth daily. (Patient not taking: Reported on 05/16/2023), Disp: 30 tablet, Rfl: 2   calcium acetate (PHOSLO) 667 MG capsule, Take 667 mg by mouth 3 (three) times daily., Disp: , Rfl:    calcium carbonate (OS-CAL - DOSED IN MG OF ELEMENTAL CALCIUM) 1250 (500 Ca) MG tablet, Take 1 tablet (1,250 mg total) by mouth 2 (two) times daily with a meal. (Patient not taking: Reported on 05/16/2023), Disp: 60 tablet, Rfl: 0   Cholecalciferol (VITAMIN D-1000 MAX ST) 25 MCG (1000 UT) tablet, Take by mouth., Disp: , Rfl:    clopidogrel (PLAVIX) 75 MG tablet, Take 1 tablet (75 mg total) by mouth daily. (Patient not taking: Reported on 05/16/2023), Disp: 30 tablet, Rfl: 2   docusate sodium (COLACE) 100 MG capsule, Take 1 capsule (100 mg total) by mouth 2 (two) times daily as needed for mild constipation. (Patient not taking: Reported on 05/16/2023), Disp: 60 capsule, Rfl: 0   hydrOXYzine (ATARAX) 25 MG tablet, Take 1 tablet by mouth 2 (two) times daily., Disp: , Rfl:    midodrine (  PROAMATINE) 5 MG tablet, Take 1 tablet (5 mg total) by mouth 3 (three) times daily with meals as needed (For systolic below 120). Check your blood pressure before taking, take for blood pressure less than 120, Disp: 90 tablet, Rfl: 1   multivitamin (RENA-VIT) TABS tablet, Take 1 tablet by mouth at bedtime., Disp: 30 tablet, Rfl: 2   nutrition supplement, JUVEN, (JUVEN) PACK, Take 1 packet by mouth 2 (two) times daily between meals. (Patient not taking: Reported on 05/16/2023), Disp: 30 each, Rfl:  1   Nutritional Supplements (,FEEDING SUPPLEMENT, PROSOURCE PLUS) liquid, Take 30 mLs by mouth 3 (three) times daily between meals. (Patient not taking: Reported on 05/16/2023), Disp: 887 mL, Rfl: 1   [START ON 07/22/2023] oxyCODONE-acetaminophen (PERCOCET) 10-325 MG tablet, Take 1 tablet by mouth every 4 (four) hours as needed for pain., Disp: 135 tablet, Rfl: 0   oxyCODONE-acetaminophen (PERCOCET/ROXICET) 5-325 MG tablet, Take 1-2 tablets by mouth every 4 (four) hours as needed for moderate pain (pain score 4-6). (Patient not taking: Reported on 05/16/2023), Disp: 30 tablet, Rfl: 0   polyethylene glycol (MIRALAX / GLYCOLAX) 17 g packet, Take 17 g by mouth daily as needed for moderate constipation. (Patient not taking: Reported on 05/16/2023), Disp: 14 each, Rfl: 0   pregabalin (LYRICA) 25 MG capsule, Take 25 mg by mouth 2 (two) times daily as needed., Disp: , Rfl:    sildenafil (VIAGRA) 100 MG tablet, Take 100 mg by mouth daily as needed for erectile dysfunction., Disp: , Rfl:    zinc sulfate 220 (50 Zn) MG capsule, Take 1 capsule (220 mg total) by mouth daily. (Patient not taking: Reported on 05/16/2023), Disp: , Rfl:    Past Medical History:  Diagnosis Date   CHF (congestive heart failure) (HCC)    Degenerative disc disease, lumbar    Depression    Dialysis patient (HCC)    ESRD (end stage renal disease) on dialysis (HCC)    Failure to thrive in adult    GERD (gastroesophageal reflux disease)    Gout    Guillain Barr syndrome (HCC)    Guillain Barr syndrome (HCC)    Heart murmur    Hip pain, chronic, left 12/24/2020   HTN (hypertension)    Hyperparathyroidism (HCC)    Kidney failure    MRSA (methicillin resistant staph aureus) culture positive    Peripheral neuropathy    Pneumonia    Renal insufficiency    Respiratory failure (HCC)    Assessment and Plan: 1. Chronic pain syndrome   2. Chronic, continuous use of opioids   3. Facet arthritis of lumbar region   4. Guillain-Barre  syndrome (HCC)   5. Weakness of both lower extremities   6. ESRD on dialysis (HCC)   7. Chronic bilateral low back pain without sciatica   8. Hip pain, chronic, left   9. Degeneration of intervertebral disc of lumbar region with discogenic back pain    As reviewed today I think is appropriate to continue with his current medication management.  I have reviewed the [facility] practitioner database information is appropriate for refill.  This be dated from March 30 in April 29.  No other changes in his regimen will be initiated.  I have reviewed his most recent serum blood work and it is appropriate as well.  Continue follow-up with his primary care physician for baseline medical care with return to clinic scheduled in 2 months.  Follow Up Instructions:    I discussed the assessment and treatment  plan with the patient. The patient was provided an opportunity to ask questions and all were answered. The patient agreed with the plan and demonstrated an understanding of the instructions.   The patient was advised to call back or seek an in-person evaluation if the symptoms worsen or if the condition fails to improve as anticipated.  I provided 30 minutes of non-face-to-face time during this encounter.   Yevette Edwards, MD

## 2023-07-10 NOTE — Patient Instructions (Signed)

## 2023-08-10 NOTE — Progress Notes (Signed)
 Progress Note   Patient: Patrick Downs UJW:[id number] DOB: 12-20-1975 DOA: 01/21/2023     9 DOS: the patient was seen and examined on 08/10/2023      Subjective:  Patient sleeping but woke up to voice. Denies complaints this AM.  Working on help at home and should be ready for d/c tomorrow.     Brief hospital course: From HPI "NAOD SWEETLAND is a 48 y.o. male with medical history significant of ESRD on hemodialysis Tuesday Thursday Saturday, GERD, hypertension, gout, type 2 diabetes, peripheral neuropathy, prior toe amputation presenting with right lower extremity cellulitis.  Patient reports ulcer formation on the right lateral foot.  Has had worsening drainage over multiple days.  Mom malaise.  Patient does wear a brace on the right foot.  Has been unable to wear brace secondary to swelling.  Noted malodorous drainage from wound.  No fevers or chills.  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  Non-smoker.  Patient denies any alcohol use.  Has been compliant with hemodialysis regimen. Presented to the ER Tmax 99.4, blood pressures 90s to 110s over 50s to 70s.  Satting well on room air.  White count 8.1, hemoglobin 10.2, platelets 150.  Creatinine 7.7, T. bili 1.5.  Right foot plain films with superficial soft tissue wound with subcutaneous gas adjacent to the base of the fifth metatarsal bone.  "   Assessment and Plan:   Cellulitis of the right fourth toe with underlying osteomyelitis Right lateral foot cellulitis in setting of end-stage renal disease with prior amputations in the past MRI of the right foot showing soft tissue ulceration lateral to the base of the fifth metatarsal with underlying marrow changes at the base of the fifth metatarsal suspicious for osteomyelitis.  No abscess detected. 10/1 - podiatry did excisional debridement and bone biopsy, wound vac placed - See Op Note 10/2 - vascular did RLE angio & re-vascularization - see Op Note --Initially on broad  spectrum - Vanc/Cefepime /Flagyl  --Consults -- Infectious disease, Vascular surgery, Podiatry --Now on Ancef  - to be given at dialysis TTS - end date 02/19/23 --Wound care RN following --Wound vac changes Monday/Thursday while admitted.   --At d/c, wound vac changes 3 times / week --Non-weight bearing on RIGHT foot --PT evaluation - HH recommended --Follow up with Podiatry in 2 weeks     Type 2 diabetes mellitus with peripheral neuropathy (HCC) Well controlled - A1c 6.0% Pt refusing CBG's and insulin  per nursing --D/C CBG's and sliding scale insulin  for now --Resume if fasting glucose on BMP's is > 180   ESRD on dialysis (HCC) Baseline ESRD on HD TTS  No reported missed episodes of HD  --Nephrology following for dialysis --Monitor renal function panels     Polyneuropathy, unspecified Continue Lyrica    GERD (gastroesophageal reflux disease) PPI    Hyperkalemia - due to ESRD Mgmt per nephrology with dialysis    Advance Care Planning:   Code Status: Full Code    Consults: nephrology, podiatry, vascular surgery   Family Communication: None at bedside. Pt able to update.      Disposition -- home with [facility] likely Monday 10/7.   Pt arranging in home assistance and help with transportation to his outpatient dialysis given R foot non-weight-bearing he is unable to drive.    Physical Exam: General exam: sleeping wakes to voice, no acute distress Respiratory system: on room air, normal respiratory effort. Lungs clear no wheezes or rhonchi Cardiovascular system: RRR, no pedal edema.  +  S1/S2 Central nervous system: A&O x3. no gross focal neurologic deficits, normal speech Extremities: right foot wound vac present, no edema Skin: dry, intact, normal temperature Psychiatry: normal mood, congruent affect, judgement and insight appear normal        Data Reviewed:  Notable labs --- bicarb 17, BUN 72, Cr 10.26, cva 6.8, albumin 3.3, gap 20, phos 6.5   Vitals:   01/30/23 1130  01/30/23 1200 01/30/23 1235 01/30/23 1245  BP: (!) 110/59 (!) 116/59 124/75   Pulse: 83 81 88   Resp: 11 15 19    Temp:   97.7 F (36.5 C)   TempSrc:   Oral   SpO2: 99% 100% 100%   Weight:    122.7 kg  Height:        Author: Montey Apa, DO 08/10/2023 1:08 PM  For on call review [email or web address].

## 2023-09-12 ENCOUNTER — Ambulatory Visit: Attending: Anesthesiology | Admitting: Anesthesiology

## 2023-09-12 ENCOUNTER — Encounter: Payer: Self-pay | Admitting: Anesthesiology

## 2023-09-12 DIAGNOSIS — G610 Guillain-Barre syndrome: Secondary | ICD-10-CM | POA: Diagnosis not present

## 2023-09-12 DIAGNOSIS — G894 Chronic pain syndrome: Secondary | ICD-10-CM

## 2023-09-12 DIAGNOSIS — F1190 Opioid use, unspecified, uncomplicated: Secondary | ICD-10-CM

## 2023-09-12 DIAGNOSIS — M47816 Spondylosis without myelopathy or radiculopathy, lumbar region: Secondary | ICD-10-CM | POA: Diagnosis not present

## 2023-09-12 DIAGNOSIS — Z79891 Long term (current) use of opiate analgesic: Secondary | ICD-10-CM

## 2023-09-12 DIAGNOSIS — N186 End stage renal disease: Secondary | ICD-10-CM

## 2023-09-12 DIAGNOSIS — Z992 Dependence on renal dialysis: Secondary | ICD-10-CM

## 2023-09-12 DIAGNOSIS — M545 Low back pain, unspecified: Secondary | ICD-10-CM

## 2023-09-12 DIAGNOSIS — G8929 Other chronic pain: Secondary | ICD-10-CM

## 2023-09-12 DIAGNOSIS — M25552 Pain in left hip: Secondary | ICD-10-CM

## 2023-09-12 DIAGNOSIS — R29898 Other symptoms and signs involving the musculoskeletal system: Secondary | ICD-10-CM

## 2023-09-12 MED ORDER — OXYCODONE-ACETAMINOPHEN 10-325 MG PO TABS: 1.0000 | ORAL_TABLET | ORAL | 0 refills | Status: AC | PRN

## 2023-09-12 MED ORDER — OXYCODONE-ACETAMINOPHEN 10-325 MG PO TABS: 1.0000 | ORAL_TABLET | ORAL | 0 refills | Status: DC | PRN

## 2023-09-12 NOTE — Progress Notes (Signed)
 Virtual Visit via Telephone Note  I connected with Patrick Downs on 09/12/23 at  1:40 PM EDT by telephone and verified that I am speaking with the correct person using two identifiers.  Location: Patient: Home Provider: Pain control center   I discussed the limitations, risks, security and privacy concerns of performing an evaluation and management service by telephone and the availability of in person appointments. I also discussed with the patient that there may be a patient responsible charge related to this service. The patient expressed understanding and agreed to proceed.   History of Present Illness: I spoke with Patrick Downs via telephone as we were unable like for the video portion of the conference but he reports that he is doing well.  No changes in his lower back pain or leg pain are noted.  The quality characteristic and distribution has been stable and he continues to take his Percocet 01/25/2024 about every 4 hours getting good relief with this regimen.  He has been on chronic opioids for a prolonged time secondary to chronic low back pain and this ongoing condition but he is getting good relief rated about 80% lasting for hours following administration of the Percocet.  He has failed more conservative therapy but reports good functional improvement and capacity with the medication.  Otherwise he reports no change in lower extremity strength function bowel or bladder function.  He is continue to follow-up with his nephrologist secondary to his ongoing end-stage renal disease.  Review of systems: General: No fevers or chills Pulmonary: No shortness of breath or dyspnea Cardiac: No angina or palpitations or lightheadedness GI: No abdominal pain or constipation Psych: No depression    Observations/Objective:  Current Outpatient Medications:    [START ON 10/21/2023] oxyCODONE -acetaminophen  (PERCOCET) 10-325 MG tablet, Take 1 tablet by mouth every 4 (four) hours as needed for  pain., Disp: 135 tablet, Rfl: 0   acetaminophen  (TYLENOL ) 500 MG tablet, Take 1,000-1,500 mg by mouth 2 (two) times daily as needed for moderate pain or headache., Disp: , Rfl:    ascorbic acid  (VITAMIN C ) 500 MG tablet, Take 1 tablet (500 mg total) by mouth 2 (two) times daily. (Patient not taking: Reported on 05/16/2023), Disp: , Rfl:    aspirin  EC 81 MG tablet, Take 1 tablet (81 mg total) by mouth daily. Swallow whole. (Patient not taking: Reported on 05/16/2023), Disp: , Rfl:    atorvastatin  (LIPITOR) 40 MG tablet, Take 1 tablet (40 mg total) by mouth daily. (Patient not taking: Reported on 05/16/2023), Disp: 30 tablet, Rfl: 2   calcium  acetate (PHOSLO ) 667 MG capsule, Take 667 mg by mouth 3 (three) times daily., Disp: , Rfl:    calcium  carbonate (OS-CAL - DOSED IN MG OF ELEMENTAL CALCIUM ) 1250 (500 Ca) MG tablet, Take 1 tablet (1,250 mg total) by mouth 2 (two) times daily with a meal. (Patient not taking: Reported on 05/16/2023), Disp: 60 tablet, Rfl: 0   Cholecalciferol (VITAMIN D -1000 MAX ST) 25 MCG (1000 UT) tablet, Take by mouth., Disp: , Rfl:    clopidogrel  (PLAVIX ) 75 MG tablet, Take 1 tablet (75 mg total) by mouth daily. (Patient not taking: Reported on 05/16/2023), Disp: 30 tablet, Rfl: 2   docusate sodium  (COLACE) 100 MG capsule, Take 1 capsule (100 mg total) by mouth 2 (two) times daily as needed for mild constipation. (Patient not taking: Reported on 05/16/2023), Disp: 60 capsule, Rfl: 0   hydrOXYzine  (ATARAX ) 25 MG tablet, Take 1 tablet by mouth 2 (two) times daily., Disp: ,  Rfl:    midodrine  (PROAMATINE ) 5 MG tablet, Take 1 tablet (5 mg total) by mouth 3 (three) times daily with meals as needed (For systolic below 120). Check your blood pressure before taking, take for blood pressure less than 120, Disp: 90 tablet, Rfl: 1   multivitamin (RENA-VIT) TABS tablet, Take 1 tablet by mouth at bedtime., Disp: 30 tablet, Rfl: 2   nutrition supplement, JUVEN, (JUVEN) PACK, Take 1 packet by mouth 2  (two) times daily between meals. (Patient not taking: Reported on 05/16/2023), Disp: 30 each, Rfl: 1   Nutritional Supplements (,FEEDING SUPPLEMENT, PROSOURCE PLUS) liquid, Take 30 mLs by mouth 3 (three) times daily between meals. (Patient not taking: Reported on 05/16/2023), Disp: 887 mL, Rfl: 1   [START ON 09/21/2023] oxyCODONE -acetaminophen  (PERCOCET) 10-325 MG tablet, Take 1 tablet by mouth every 4 (four) hours as needed for pain., Disp: 135 tablet, Rfl: 0   oxyCODONE -acetaminophen  (PERCOCET/ROXICET) 5-325 MG tablet, Take 1-2 tablets by mouth every 4 (four) hours as needed for moderate pain (pain score 4-6). (Patient not taking: Reported on 05/16/2023), Disp: 30 tablet, Rfl: 0   polyethylene glycol (MIRALAX  / GLYCOLAX ) 17 g packet, Take 17 g by mouth daily as needed for moderate constipation. (Patient not taking: Reported on 05/16/2023), Disp: 14 each, Rfl: 0   pregabalin  (LYRICA ) 25 MG capsule, Take 25 mg by mouth 2 (two) times daily as needed., Disp: , Rfl:    sildenafil (VIAGRA) 100 MG tablet, Take 100 mg by mouth daily as needed for erectile dysfunction., Disp: , Rfl:    zinc  sulfate 220 (50 Zn) MG capsule, Take 1 capsule (220 mg total) by mouth daily. (Patient not taking: Reported on 05/16/2023), Disp: , Rfl:    Past Medical History:  Diagnosis Date   CHF (congestive heart failure) (HCC)    Degenerative disc disease, lumbar    Depression    Dialysis patient (HCC)    ESRD (end stage renal disease) on dialysis (HCC)    Failure to thrive in adult    GERD (gastroesophageal reflux disease)    Gout    Guillain Barr syndrome (HCC)    Guillain Barr syndrome (HCC)    Heart murmur    Hip pain, chronic, left 12/24/2020   HTN (hypertension)    Hyperparathyroidism (HCC)    Kidney failure    MRSA (methicillin resistant staph aureus) culture positive    Peripheral neuropathy    Pneumonia    Renal insufficiency    Respiratory failure (HCC)    Assessment and Plan:  1. Chronic pain syndrome   2.  Chronic, continuous use of opioids   3. Facet arthritis of lumbar region   4. Guillain-Barre syndrome (HCC)   5. Weakness of both lower extremities   6. ESRD on dialysis (HCC)   7. Chronic bilateral low back pain without sciatica   8. Hip pain, chronic, left    Based on our conversation after review of the [place]  practitioner database information on it is appropriate to refill his medicines for the next 2 months dated from 26 in June 29.  No other changes in his regimen will be initiated.  Continue with conservative care as he is currently doing continue with his stretching strengthening regimen.  Will schedule him for 62-month return to clinic.  Continue follow-up with his nephrologist and primary care physician for baseline medical care. Follow Up Instructions:    I discussed the assessment and treatment plan with the patient. The patient was provided an opportunity to ask  questions and all were answered. The patient agreed with the plan and demonstrated an understanding of the instructions.   The patient was advised to call back or seek an in-person evaluation if the symptoms worsen or if the condition fails to improve as anticipated.  I provided 30 minutes of non-face-to-face time during this encounter.   Zula Hitch, MD

## 2023-10-31 ENCOUNTER — Other Ambulatory Visit: Payer: Self-pay | Admitting: Podiatry

## 2023-10-31 DIAGNOSIS — L97414 Non-pressure chronic ulcer of right heel and midfoot with necrosis of bone: Secondary | ICD-10-CM

## 2023-10-31 DIAGNOSIS — M86171 Other acute osteomyelitis, right ankle and foot: Secondary | ICD-10-CM

## 2023-11-06 ENCOUNTER — Encounter: Payer: Self-pay | Admitting: Anesthesiology

## 2023-11-06 ENCOUNTER — Ambulatory Visit: Admission: RE | Admit: 2023-11-06 | Source: Ambulatory Visit

## 2023-11-06 ENCOUNTER — Ambulatory Visit: Attending: Anesthesiology | Admitting: Anesthesiology

## 2023-11-06 DIAGNOSIS — R29898 Other symptoms and signs involving the musculoskeletal system: Secondary | ICD-10-CM

## 2023-11-06 DIAGNOSIS — G610 Guillain-Barre syndrome: Secondary | ICD-10-CM

## 2023-11-06 DIAGNOSIS — G8929 Other chronic pain: Secondary | ICD-10-CM

## 2023-11-06 DIAGNOSIS — G894 Chronic pain syndrome: Secondary | ICD-10-CM

## 2023-11-06 DIAGNOSIS — M47816 Spondylosis without myelopathy or radiculopathy, lumbar region: Secondary | ICD-10-CM

## 2023-11-06 DIAGNOSIS — Z992 Dependence on renal dialysis: Secondary | ICD-10-CM

## 2023-11-06 DIAGNOSIS — F1190 Opioid use, unspecified, uncomplicated: Secondary | ICD-10-CM

## 2023-11-06 DIAGNOSIS — M5136 Other intervertebral disc degeneration, lumbar region with discogenic back pain only: Secondary | ICD-10-CM

## 2023-11-06 MED ORDER — OXYCODONE-ACETAMINOPHEN 10-325 MG PO TABS: 1.0000 | ORAL_TABLET | ORAL | 0 refills | Status: DC | PRN

## 2023-11-06 NOTE — Progress Notes (Unsigned)
 Tried to reach [employer] via telephone for his video conference.  I was unsuccessful in doing so.  His voicemail box is not set up and subsequently I will refill his medicines for the July 29 refill with a requested return visit in 1 month.

## 2023-11-14 ENCOUNTER — Ambulatory Visit
Admission: RE | Admit: 2023-11-14 | Discharge: 2023-11-14 | Disposition: A | Discharge status: Home / Self Care | Source: Ambulatory Visit | Attending: Podiatry | Admitting: Podiatry

## 2023-11-14 DIAGNOSIS — M86171 Other acute osteomyelitis, right ankle and foot: Secondary | ICD-10-CM

## 2023-11-14 DIAGNOSIS — L97414 Non-pressure chronic ulcer of right heel and midfoot with necrosis of bone: Secondary | ICD-10-CM

## 2023-12-04 ENCOUNTER — Encounter: Payer: Self-pay | Admitting: Anesthesiology

## 2023-12-04 ENCOUNTER — Ambulatory Visit: Attending: Anesthesiology | Admitting: Anesthesiology

## 2023-12-04 DIAGNOSIS — G894 Chronic pain syndrome: Secondary | ICD-10-CM | POA: Diagnosis not present

## 2023-12-04 DIAGNOSIS — N186 End stage renal disease: Secondary | ICD-10-CM

## 2023-12-04 DIAGNOSIS — M47816 Spondylosis without myelopathy or radiculopathy, lumbar region: Secondary | ICD-10-CM

## 2023-12-04 DIAGNOSIS — F1190 Opioid use, unspecified, uncomplicated: Secondary | ICD-10-CM

## 2023-12-04 DIAGNOSIS — G8929 Other chronic pain: Secondary | ICD-10-CM

## 2023-12-04 DIAGNOSIS — R29898 Other symptoms and signs involving the musculoskeletal system: Secondary | ICD-10-CM | POA: Diagnosis not present

## 2023-12-04 DIAGNOSIS — G610 Guillain-Barre syndrome: Secondary | ICD-10-CM | POA: Diagnosis not present

## 2023-12-04 DIAGNOSIS — M25552 Pain in left hip: Secondary | ICD-10-CM

## 2023-12-04 DIAGNOSIS — Z79891 Long term (current) use of opiate analgesic: Secondary | ICD-10-CM

## 2023-12-04 DIAGNOSIS — Z992 Dependence on renal dialysis: Secondary | ICD-10-CM

## 2023-12-04 DIAGNOSIS — M545 Low back pain, unspecified: Secondary | ICD-10-CM

## 2023-12-04 MED ORDER — PREGABALIN 25 MG PO CAPS: 25.0000 mg | ORAL_CAPSULE | Freq: Two times a day (BID) | ORAL | 3 refills | Status: DC | PRN

## 2023-12-04 MED ORDER — OXYCODONE-ACETAMINOPHEN 10-325 MG PO TABS: 1.0000 | ORAL_TABLET | ORAL | 0 refills | Status: AC | PRN

## 2023-12-05 NOTE — Progress Notes (Signed)
 Virtual Visit via Telephone Note  I connected with Patrick Downs on 12/05/23 at 11:20 AM EDT by telephone and verified that I am speaking with the correct person using two identifiers.  Location: Patient: Home Provider: [facility] control center   I discussed the limitations, risks, security and privacy concerns of performing an evaluation and management service by telephone and the availability of in person appointments. I also discussed with the patient that there may be a patient responsible charge related to this service. The patient expressed understanding and agreed to proceed.   History of Present Illness: I spoke with Patrick Downs via telephone for our video conference.  He was unable to do the video portion of this.  He reports that he has been doing well.  He continues to have low back pain bilateral hip buttock pain and lower leg pain comparable to what he has had historically without any recent changes.  He continues take his medications as prescribed and these continue to work well for him.  He is generally reporting 50 to 75% relief lasting 4 to 6 hours when he takes the medications.  No side effects are reported.  Otherwise he is in his usual state of health. Review of systems: General: No fevers or chills Pulmonary: No shortness of breath or dyspnea Cardiac: No angina or palpitations or lightheadedness GI: No abdominal pain or constipation Psych: No depression   Observations/Objective:  Current Outpatient Medications:    [START ON 01/19/2024] oxyCODONE -acetaminophen  (PERCOCET) 10-325 MG tablet, Take 1 tablet by mouth every 4 (four) hours as needed for pain., Disp: 135 tablet, Rfl: 0   acetaminophen  (TYLENOL ) 500 MG tablet, Take 1,000-1,500 mg by mouth 2 (two) times daily as needed for moderate pain or headache., Disp: , Rfl:    ascorbic acid  (VITAMIN C ) 500 MG tablet, Take 1 tablet (500 mg total) by mouth 2 (two) times daily. (Patient not taking: Reported on 05/16/2023), Disp: ,  Rfl:    aspirin  EC 81 MG tablet, Take 1 tablet (81 mg total) by mouth daily. Swallow whole. (Patient not taking: Reported on 05/16/2023), Disp: , Rfl:    atorvastatin  (LIPITOR) 40 MG tablet, Take 1 tablet (40 mg total) by mouth daily. (Patient not taking: Reported on 05/16/2023), Disp: 30 tablet, Rfl: 2   calcium  acetate (PHOSLO ) 667 MG capsule, Take 667 mg by mouth 3 (three) times daily., Disp: , Rfl:    calcium  carbonate (OS-CAL - DOSED IN MG OF ELEMENTAL CALCIUM ) 1250 (500 Ca) MG tablet, Take 1 tablet (1,250 mg total) by mouth 2 (two) times daily with a meal. (Patient not taking: Reported on 05/16/2023), Disp: 60 tablet, Rfl: 0   Cholecalciferol (VITAMIN D -1000 MAX ST) 25 MCG (1000 UT) tablet, Take by mouth., Disp: , Rfl:    clopidogrel  (PLAVIX ) 75 MG tablet, Take 1 tablet (75 mg total) by mouth daily. (Patient not taking: Reported on 05/16/2023), Disp: 30 tablet, Rfl: 2   docusate sodium  (COLACE) 100 MG capsule, Take 1 capsule (100 mg total) by mouth 2 (two) times daily as needed for mild constipation. (Patient not taking: Reported on 05/16/2023), Disp: 60 capsule, Rfl: 0   hydrOXYzine  (ATARAX ) 25 MG tablet, Take 1 tablet by mouth 2 (two) times daily., Disp: , Rfl:    midodrine  (PROAMATINE ) 5 MG tablet, Take 1 tablet (5 mg total) by mouth 3 (three) times daily with meals as needed (For systolic below 120). Check your blood pressure before taking, take for blood pressure less than 120, Disp: 90 tablet, Rfl:  1   multivitamin (RENA-VIT) TABS tablet, Take 1 tablet by mouth at bedtime., Disp: 30 tablet, Rfl: 2   nutrition supplement, JUVEN, (JUVEN) PACK, Take 1 packet by mouth 2 (two) times daily between meals. (Patient not taking: Reported on 05/16/2023), Disp: 30 each, Rfl: 1   Nutritional Supplements (,FEEDING SUPPLEMENT, PROSOURCE PLUS) liquid, Take 30 mLs by mouth 3 (three) times daily between meals. (Patient not taking: Reported on 05/16/2023), Disp: 887 mL, Rfl: 1   [START ON 12/20/2023]  oxyCODONE -acetaminophen  (PERCOCET) 10-325 MG tablet, Take 1 tablet by mouth every 4 (four) hours as needed for pain., Disp: 135 tablet, Rfl: 0   oxyCODONE -acetaminophen  (PERCOCET/ROXICET) 5-325 MG tablet, Take 1-2 tablets by mouth every 4 (four) hours as needed for moderate pain (pain score 4-6). (Patient not taking: Reported on 05/16/2023), Disp: 30 tablet, Rfl: 0   polyethylene glycol (MIRALAX  / GLYCOLAX ) 17 g packet, Take 17 g by mouth daily as needed for moderate constipation. (Patient not taking: Reported on 05/16/2023), Disp: 14 each, Rfl: 0   pregabalin  (LYRICA ) 25 MG capsule, Take 1 capsule (25 mg total) by mouth 2 (two) times daily as needed., Disp: 60 capsule, Rfl: 3   sildenafil (VIAGRA) 100 MG tablet, Take 100 mg by mouth daily as needed for erectile dysfunction., Disp: , Rfl:    zinc  sulfate 220 (50 Zn) MG capsule, Take 1 capsule (220 mg total) by mouth daily. (Patient not taking: Reported on 05/16/2023), Disp: , Rfl:    Past Medical History:  Diagnosis Date   CHF (congestive heart failure) (HCC)    Degenerative disc disease, lumbar    Depression    Dialysis patient (HCC)    ESRD (end stage renal disease) on dialysis (HCC)    Failure to thrive in adult    GERD (gastroesophageal reflux disease)    Gout    Guillain Barr syndrome (HCC)    Guillain Barr syndrome (HCC)    Heart murmur    Hip pain, chronic, left 12/24/2020   HTN (hypertension)    Hyperparathyroidism (HCC)    Kidney failure    MRSA (methicillin resistant staph aureus) culture positive    Peripheral neuropathy    Pneumonia    Renal insufficiency    Respiratory failure (HCC)    Assessment and Plan:  1. Chronic pain syndrome   2. Chronic, continuous use of opioids   3. Facet arthritis of lumbar region   4. Guillain-Barre syndrome (HCC)   5. Weakness of both lower extremities   6. ESRD on dialysis (HCC)   7. Chronic bilateral low back pain without sciatica   8. Hip pain, chronic, left    Based on conversation  today he gets appropriate to refill his medicines for the next 2 months.  No other changes in his protocol will be initiated.  I encouraged him to continue efforts at weight loss stretching strengthening as he is doing.  Will schedule him for 65-month return.  Continue follow-up with his nephrologist and primary care physicians. Follow Up Instructions:    I discussed the assessment and treatment plan with the patient. The patient was provided an opportunity to ask questions and all were answered. The patient agreed with the plan and demonstrated an understanding of the instructions.   The patient was advised to call back or seek an in-person evaluation if the symptoms worsen or if the condition fails to improve as anticipated.  I provided 30 minutes of non-face-to-face time during this encounter.   Lynwood KANDICE Clause, MD

## 2024-01-22 ENCOUNTER — Other Ambulatory Visit: Payer: Self-pay | Admitting: Podiatry

## 2024-01-25 ENCOUNTER — Encounter
Admission: RE | Admit: 2024-01-25 | Discharge: 2024-01-25 | Disposition: A | Discharge status: Home / Self Care | Source: Ambulatory Visit | Attending: Podiatry | Admitting: Podiatry

## 2024-01-25 ENCOUNTER — Other Ambulatory Visit: Payer: Self-pay

## 2024-01-25 VITALS — Ht 74.0 in | Wt 265.0 lb

## 2024-01-25 DIAGNOSIS — Z992 Dependence on renal dialysis: Secondary | ICD-10-CM

## 2024-01-25 DIAGNOSIS — I10 Essential (primary) hypertension: Secondary | ICD-10-CM

## 2024-01-25 DIAGNOSIS — Z01810 Encounter for preprocedural cardiovascular examination: Secondary | ICD-10-CM

## 2024-01-25 DIAGNOSIS — Z01812 Encounter for preprocedural laboratory examination: Secondary | ICD-10-CM

## 2024-01-25 HISTORY — DX: Type 2 diabetes mellitus without complications: E11.9

## 2024-01-25 NOTE — Patient Instructions (Addendum)
 Your procedure is scheduled on:  FRIDAY OCTOBER 10  Report to the Registration Desk on the 1st floor of the [facility] To find out your arrival time, please call [phone] between 1PM - 3PM on:  THURSDAY OCTOBER 9  If your arrival time is 6:00 am, do not arrive before that time as the [facility] entrance doors do not open until 6:00 am.  REMEMBER: Instructions that are not followed completely may result in serious medical risk, up to and including death; or upon the discretion of your surgeon and anesthesiologist your surgery may need to be rescheduled.  Do not eat food after midnight the night before surgery.  No gum chewing or hard candies.  You may however, drink CLEAR liquids up to 3 hours before you are scheduled to arrive for your surgery. Do not drink anything within 3 hours of your scheduled arrival time.  Clear liquids include: - water  - apple juice without pulp - gatorade (not RED colors) - black coffee or tea (Do NOT add milk or creamers to the coffee or tea) Do NOT drink anything that is not on this list.   In addition, your doctor has ordered for you to drink the provided:  Ensure Pre-Surgery Clear Carbohydrate Drink   Drinking this carbohydrate drink up to three hours before surgery helps to reduce insulin  resistance and improve patient outcomes. Please complete drinking 3 hours before scheduled arrival time.  One week prior to surgery: Stop Anti-inflammatories (NSAIDS) such as Advil , Aleve, Ibuprofen , Motrin , Naproxen, Naprosyn and Aspirin  based products such as Excedrin, Goody's Powder, BC Powder. Stop ANY OVER THE COUNTER supplements until after surgery.  You may however, continue to take Tylenol  if needed for pain up until the day of surgery.  Continue taking all of your other prescription medications up until the day of surgery.  ON THE DAY OF SURGERY ONLY TAKE THESE MEDICATIONS WITH SIPS OF WATER:   No Alcohol for 24 hours before or after  surgery.   Do not use any recreational drugs for at least a week (preferably 2 weeks) before your surgery.  Please be advised that the combination of cocaine and anesthesia may have negative outcomes, up to and including death. If you test positive for cocaine, your surgery will be cancelled.  On the morning of surgery brush your teeth with toothpaste and water, you may rinse your mouth with mouthwash if you wish. Do not swallow any toothpaste or mouthwash.  Use CHG Soap as directed on instruction sheet.  Do not wear jewelry, make-up, hairpins, clips or nail polish.  For welded (permanent) jewelry: bracelets, anklets, waist bands, etc.  Please have this removed prior to surgery.  If it is not removed, there is a chance that hospital personnel will need to cut it off on the day of surgery.  Do not wear lotions, powders, or perfumes.   Do not shave body hair from the neck down 48 hours before surgery.  Do not bring valuables to the hospital. [facility] is not responsible for any missing/lost belongings or valuables.   Notify your doctor if there is any change in your medical condition (cold, fever, infection).  Wear comfortable clothing (specific to your surgery type) to the hospital.  After surgery, you can help prevent lung complications by doing breathing exercises.  Take deep breaths and cough every 1-2 hours. Your doctor may order a device called an Incentive Spirometer to help you take deep breaths.  If you are being discharged the  day of surgery, you will not be allowed to drive home. You will need a responsible individual to drive you home and stay with you for 24 hours after surgery.   If you are taking public transportation, you will need to have a responsible individual with you.  Please call the Pre-admissions Testing Dept. at [phone] if you have any questions about these instructions.  Surgery Visitation Policy:  Patients having surgery or a procedure may  have two visitors.  Children under the age of 61 must have an adult with them who is not the patient.   Merchandiser, retail to address health-related social needs:  [email or web address]

## 2024-01-28 ENCOUNTER — Encounter
Admission: RE | Admit: 2024-01-28 | Discharge: 2024-01-28 | Disposition: A | Discharge status: Home / Self Care | Source: Ambulatory Visit | Attending: Podiatry | Admitting: Podiatry

## 2024-01-28 DIAGNOSIS — I10 Essential (primary) hypertension: Secondary | ICD-10-CM | POA: Diagnosis not present

## 2024-01-28 DIAGNOSIS — Z01810 Encounter for preprocedural cardiovascular examination: Secondary | ICD-10-CM

## 2024-01-28 DIAGNOSIS — Z992 Dependence on renal dialysis: Secondary | ICD-10-CM | POA: Diagnosis not present

## 2024-01-28 DIAGNOSIS — Z01818 Encounter for other preprocedural examination: Secondary | ICD-10-CM | POA: Diagnosis present

## 2024-01-28 DIAGNOSIS — Z01812 Encounter for preprocedural laboratory examination: Secondary | ICD-10-CM

## 2024-01-28 LAB — CBC
HCT: 34.4 % — ABNORMAL LOW (ref 39.0–52.0)
Hemoglobin: 11.2 g/dL — ABNORMAL LOW (ref 13.0–17.0)
MCH: 29.6 pg (ref 26.0–34.0)
MCHC: 32.6 g/dL (ref 30.0–36.0)
MCV: 91 fL (ref 80.0–100.0)
Platelets: 137 K/uL — ABNORMAL LOW (ref 150–400)
RBC: 3.78 MIL/uL — ABNORMAL LOW (ref 4.22–5.81)
RDW: 15.1 % (ref 11.5–15.5)
WBC: 4.6 K/uL (ref 4.0–10.5)
nRBC: 0 % (ref 0.0–0.2)

## 2024-01-28 LAB — BASIC METABOLIC PANEL WITH GFR
Anion gap: 20 — ABNORMAL HIGH (ref 5–15)
BUN: 53 mg/dL — ABNORMAL HIGH (ref 6–20)
CO2: 21 mmol/L — ABNORMAL LOW (ref 22–32)
Calcium: 7.3 mg/dL — ABNORMAL LOW (ref 8.9–10.3)
Chloride: 99 mmol/L (ref 98–111)
Creatinine, Ser: 13.91 mg/dL — ABNORMAL HIGH (ref 0.61–1.24)
GFR, Estimated: 4 mL/min — ABNORMAL LOW (ref >60)
Glucose, Bld: 95 mg/dL (ref 70–99)
Potassium: 5 mmol/L (ref 3.5–5.1)
Sodium: 140 mmol/L (ref 135–145)

## 2024-02-01 ENCOUNTER — Ambulatory Visit
Admission: RE | Admit: 2024-02-01 | Discharge: 2024-02-01 | Disposition: A | Discharge status: Home / Self Care | Attending: Podiatry | Admitting: Podiatry

## 2024-02-01 ENCOUNTER — Other Ambulatory Visit: Payer: Self-pay

## 2024-02-01 ENCOUNTER — Encounter
Admission: RE | Disposition: A | Discharge status: Home / Self Care | Payer: Self-pay | Source: Home / Self Care | Attending: Podiatry

## 2024-02-01 ENCOUNTER — Encounter: Payer: Self-pay | Admitting: Podiatry

## 2024-02-01 ENCOUNTER — Ambulatory Visit: Admitting: Urgent Care

## 2024-02-01 ENCOUNTER — Ambulatory Visit: Admitting: Anesthesiology

## 2024-02-01 ENCOUNTER — Ambulatory Visit

## 2024-02-01 DIAGNOSIS — G8929 Other chronic pain: Secondary | ICD-10-CM | POA: Insufficient documentation

## 2024-02-01 DIAGNOSIS — K219 Gastro-esophageal reflux disease without esophagitis: Secondary | ICD-10-CM | POA: Diagnosis not present

## 2024-02-01 DIAGNOSIS — M21379 Foot drop, unspecified foot: Secondary | ICD-10-CM | POA: Insufficient documentation

## 2024-02-01 DIAGNOSIS — D631 Anemia in chronic kidney disease: Secondary | ICD-10-CM | POA: Insufficient documentation

## 2024-02-01 DIAGNOSIS — G629 Polyneuropathy, unspecified: Secondary | ICD-10-CM | POA: Diagnosis not present

## 2024-02-01 DIAGNOSIS — E669 Obesity, unspecified: Secondary | ICD-10-CM | POA: Insufficient documentation

## 2024-02-01 DIAGNOSIS — M86171 Other acute osteomyelitis, right ankle and foot: Secondary | ICD-10-CM | POA: Diagnosis present

## 2024-02-01 DIAGNOSIS — N186 End stage renal disease: Secondary | ICD-10-CM | POA: Insufficient documentation

## 2024-02-01 DIAGNOSIS — N2581 Secondary hyperparathyroidism of renal origin: Secondary | ICD-10-CM | POA: Insufficient documentation

## 2024-02-01 DIAGNOSIS — I959 Hypotension, unspecified: Secondary | ICD-10-CM | POA: Insufficient documentation

## 2024-02-01 DIAGNOSIS — I132 Hypertensive heart and chronic kidney disease with heart failure and with stage 5 chronic kidney disease, or end stage renal disease: Secondary | ICD-10-CM | POA: Diagnosis not present

## 2024-02-01 DIAGNOSIS — Z6834 Body mass index (BMI) 34.0-34.9, adult: Secondary | ICD-10-CM | POA: Insufficient documentation

## 2024-02-01 DIAGNOSIS — I509 Heart failure, unspecified: Secondary | ICD-10-CM | POA: Insufficient documentation

## 2024-02-01 DIAGNOSIS — M109 Gout, unspecified: Secondary | ICD-10-CM | POA: Diagnosis not present

## 2024-02-01 DIAGNOSIS — G4730 Sleep apnea, unspecified: Secondary | ICD-10-CM | POA: Insufficient documentation

## 2024-02-01 DIAGNOSIS — L97414 Non-pressure chronic ulcer of right heel and midfoot with necrosis of bone: Secondary | ICD-10-CM | POA: Diagnosis present

## 2024-02-01 HISTORY — PX: METATARSAL HEAD EXCISION: SHX5027

## 2024-02-01 LAB — GLUCOSE, CAPILLARY: Glucose-Capillary: 116 mg/dL — ABNORMAL HIGH (ref 70–99)

## 2024-02-01 SURGERY — EXCISION, METATARSAL BONE, HEAD
Anesthesia: General | Site: Toe | Laterality: Right

## 2024-02-01 MED ORDER — METOCLOPRAMIDE HCL 10 MG PO TABS: 5.0000 mg | ORAL_TABLET | Freq: Three times a day (TID) | ORAL | Status: DC | PRN

## 2024-02-01 MED ORDER — FENTANYL CITRATE (PF) 100 MCG/2ML IJ SOLN
INTRAMUSCULAR | Status: AC
  Filled 2024-02-01: qty 2

## 2024-02-01 MED ORDER — SODIUM CHLORIDE 0.9 % IV SOLN: INTRAVENOUS | Status: DC

## 2024-02-01 MED ORDER — ACETAMINOPHEN 10 MG/ML IV SOLN
INTRAVENOUS | Status: DC | PRN
  Administered 2024-02-01: 1000 mg via INTRAVENOUS

## 2024-02-01 MED ORDER — BUPIVACAINE HCL (PF) 0.5 % IJ SOLN
INTRAMUSCULAR | Status: AC
  Filled 2024-02-01: qty 30

## 2024-02-01 MED ORDER — ACETAMINOPHEN 10 MG/ML IV SOLN
INTRAVENOUS | Status: AC
Start: 2024-02-01 — End: 2024-02-01
  Filled 2024-02-01: qty 100

## 2024-02-01 MED ORDER — FENTANYL CITRATE (PF) 100 MCG/2ML IJ SOLN: 25.0000 ug | INTRAMUSCULAR | Status: DC | PRN

## 2024-02-01 MED ORDER — OXYCODONE HCL 5 MG/5ML PO SOLN: 5.0000 mg | Freq: Once | ORAL | Status: DC | PRN

## 2024-02-01 MED ORDER — EPHEDRINE SULFATE (PRESSORS) 50 MG/ML IJ SOLN
INTRAMUSCULAR | Status: AC
  Filled 2024-02-01: qty 1

## 2024-02-01 MED ORDER — EPHEDRINE SULFATE (PRESSORS) 50 MG/ML IJ SOLN
10.0000 mg | Freq: Once | INTRAMUSCULAR | Status: AC
  Administered 2024-02-01: 10 mg via INTRAVENOUS

## 2024-02-01 MED ORDER — MIDAZOLAM HCL 2 MG/2ML IJ SOLN
INTRAMUSCULAR | Status: AC
  Filled 2024-02-01: qty 2

## 2024-02-01 MED ORDER — PROPOFOL 10 MG/ML IV BOLUS: INTRAVENOUS | Status: DC | PRN

## 2024-02-01 MED ORDER — DEXMEDETOMIDINE HCL IN NACL 200 MCG/50ML IV SOLN
INTRAVENOUS | Status: DC | PRN
Start: 2024-02-01 — End: 2024-02-01
  Administered 2024-02-01 (×2): 12 ug via INTRAVENOUS

## 2024-02-01 MED ORDER — BUPIVACAINE HCL (PF) 0.5 % IJ SOLN
INTRAMUSCULAR | Status: DC | PRN
  Administered 2024-02-01: 10 mL

## 2024-02-01 MED ORDER — LACTATED RINGERS IV SOLN: INTRAVENOUS | Status: DC

## 2024-02-01 MED ORDER — PHENYLEPHRINE HCL-NACL 20-0.9 MG/250ML-% IV SOLN
INTRAVENOUS | Status: DC | PRN
Start: 2024-02-01 — End: 2024-02-01
  Administered 2024-02-01: 25 ug/min via INTRAVENOUS

## 2024-02-01 MED ORDER — PROPOFOL 500 MG/50ML IV EMUL
INTRAVENOUS | Status: DC | PRN
  Administered 2024-02-01: 125 ug/kg/min via INTRAVENOUS

## 2024-02-01 MED ORDER — ONDANSETRON HCL 4 MG PO TABS: 4.0000 mg | ORAL_TABLET | Freq: Four times a day (QID) | ORAL | Status: DC | PRN

## 2024-02-01 MED ORDER — CEFAZOLIN SODIUM-DEXTROSE 2-4 GM/100ML-% IV SOLN
INTRAVENOUS | Status: AC
  Filled 2024-02-01: qty 100

## 2024-02-01 MED ORDER — CEFADROXIL 500 MG PO CAPS: 500.0000 mg | ORAL_CAPSULE | Freq: Two times a day (BID) | ORAL | 0 refills | Status: AC

## 2024-02-01 MED ORDER — KETAMINE HCL 50 MG/5ML IJ SOSY
PREFILLED_SYRINGE | INTRAMUSCULAR | Status: AC
  Filled 2024-02-01: qty 5

## 2024-02-01 MED ORDER — ORAL CARE MOUTH RINSE: 15.0000 mL | Freq: Once | OROMUCOSAL | Status: AC

## 2024-02-01 MED ORDER — ONDANSETRON HCL 4 MG/2ML IJ SOLN: 4.0000 mg | Freq: Four times a day (QID) | INTRAMUSCULAR | Status: DC | PRN

## 2024-02-01 MED ORDER — LIDOCAINE HCL (CARDIAC) PF 100 MG/5ML IV SOSY
PREFILLED_SYRINGE | INTRAVENOUS | Status: DC | PRN
  Administered 2024-02-01: 60 mg via INTRATRACHEAL

## 2024-02-01 MED ORDER — ONDANSETRON HCL 4 MG/2ML IJ SOLN
INTRAMUSCULAR | Status: DC | PRN
  Administered 2024-02-01: 4 mg via INTRAVENOUS

## 2024-02-01 MED ORDER — 0.9 % SODIUM CHLORIDE (POUR BTL) OPTIME
TOPICAL | Status: DC | PRN
  Administered 2024-02-01: 1000 mL

## 2024-02-01 MED ORDER — LIDOCAINE HCL (PF) 1 % IJ SOLN
INTRAMUSCULAR | Status: AC
  Filled 2024-02-01: qty 30

## 2024-02-01 MED ORDER — PHENYLEPHRINE 80 MCG/ML (10ML) SYRINGE FOR IV PUSH (FOR BLOOD PRESSURE SUPPORT)
PREFILLED_SYRINGE | INTRAVENOUS | Status: DC | PRN
  Administered 2024-02-01: 80 ug via INTRAVENOUS
  Administered 2024-02-01 (×3): 160 ug via INTRAVENOUS

## 2024-02-01 MED ORDER — METOCLOPRAMIDE HCL 5 MG/ML IJ SOLN: 5.0000 mg | Freq: Three times a day (TID) | INTRAMUSCULAR | Status: DC | PRN

## 2024-02-01 MED ORDER — ONDANSETRON HCL 4 MG/2ML IJ SOLN: 4.0000 mg | Freq: Once | INTRAMUSCULAR | Status: DC | PRN

## 2024-02-01 MED ORDER — CHLORHEXIDINE GLUCONATE 0.12 % MT SOLN
OROMUCOSAL | Status: AC
  Filled 2024-02-01: qty 15

## 2024-02-01 MED ORDER — MIDAZOLAM HCL 2 MG/2ML IJ SOLN
INTRAMUSCULAR | Status: DC | PRN
  Administered 2024-02-01: 2 mg via INTRAVENOUS

## 2024-02-01 MED ORDER — VASOPRESSIN 20 UNIT/ML IV SOLN
INTRAVENOUS | Status: DC | PRN
  Administered 2024-02-01: 4 [IU] via INTRAVENOUS

## 2024-02-01 MED ORDER — PROPOFOL 10 MG/ML IV BOLUS
INTRAVENOUS | Status: DC | PRN
  Administered 2024-02-01: 50 mg via INTRAVENOUS
  Administered 2024-02-01: 20 mg via INTRAVENOUS

## 2024-02-01 MED ORDER — CEFAZOLIN SODIUM-DEXTROSE 3-4 GM/150ML-% IV SOLN
3.0000 g | INTRAVENOUS | Status: AC
  Administered 2024-02-01: 3 g via INTRAVENOUS
  Filled 2024-02-01: qty 150

## 2024-02-01 MED ORDER — OXYCODONE HCL 5 MG PO TABS: 5.0000 mg | ORAL_TABLET | Freq: Once | ORAL | Status: DC | PRN

## 2024-02-01 MED ORDER — CHLORHEXIDINE GLUCONATE 0.12 % MT SOLN
15.0000 mL | Freq: Once | OROMUCOSAL | Status: AC
  Administered 2024-02-01: 15 mL via OROMUCOSAL

## 2024-02-01 MED ORDER — EPHEDRINE SULFATE-NACL 50-0.9 MG/10ML-% IV SOSY
PREFILLED_SYRINGE | INTRAVENOUS | Status: DC | PRN
  Administered 2024-02-01: 10 mg via INTRAVENOUS

## 2024-02-01 MED ORDER — ACETAMINOPHEN 10 MG/ML IV SOLN: 1000.0000 mg | Freq: Once | INTRAVENOUS | Status: DC | PRN

## 2024-02-01 MED ORDER — FENTANYL CITRATE (PF) 100 MCG/2ML IJ SOLN
INTRAMUSCULAR | Status: DC | PRN
  Administered 2024-02-01 (×2): 25 ug via INTRAVENOUS

## 2024-02-01 MED ORDER — KETAMINE HCL 10 MG/ML IJ SOLN
INTRAMUSCULAR | Status: DC | PRN
  Administered 2024-02-01: 10 mg via INTRAVENOUS
  Administered 2024-02-01 (×2): 20 mg via INTRAVENOUS

## 2024-02-01 SURGICAL SUPPLY — 44 items
BLADE MED AGGRESSIVE (BLADE) ×1 IMPLANT
BLADE OSC/SAGITTAL MD 5.5X18 (BLADE) ×1 IMPLANT
BLADE SURG 15 STRL LF DISP TIS (BLADE) ×2 IMPLANT
BLADE SURG MINI STRL (BLADE) ×1 IMPLANT
BNDG COHESIVE 4X5 TAN STRL LF (GAUZE/BANDAGES/DRESSINGS) IMPLANT
BNDG COMPR 6X5.8 VLCR NS LF (GAUZE/BANDAGES/DRESSINGS) ×2 IMPLANT
BNDG ELASTIC 4X5.8 VLCR NS LF (GAUZE/BANDAGES/DRESSINGS) ×1 IMPLANT
BNDG ESMARCH 4X12 STRL LF (GAUZE/BANDAGES/DRESSINGS) ×1 IMPLANT
BNDG STRETCH GAUZE 3IN X12FT (GAUZE/BANDAGES/DRESSINGS) ×1 IMPLANT
CUFF TOURN SGL QUICK 12 (TOURNIQUET CUFF) IMPLANT
CUFF TOURN SGL QUICK 18X4 (TOURNIQUET CUFF) IMPLANT
DRAPE FLUOR MINI C-ARM 54X84 (DRAPES) ×1 IMPLANT
DURAPREP 26ML APPLICATOR (WOUND CARE) ×1 IMPLANT
ELECTRODE REM PT RTRN 9FT ADLT (ELECTROSURGICAL) ×1 IMPLANT
GAUZE SPONGE 4X4 12PLY STRL (GAUZE/BANDAGES/DRESSINGS) ×1 IMPLANT
GAUZE XEROFORM 1X8 LF (GAUZE/BANDAGES/DRESSINGS) ×1 IMPLANT
GLOVE BIO SURGEON STRL SZ7.5 (GLOVE) ×1 IMPLANT
GLOVE INDICATOR 8.0 STRL GRN (GLOVE) ×1 IMPLANT
GOWN STRL REUS W/ TWL XL LVL3 (GOWN DISPOSABLE) ×2 IMPLANT
MANIFOLD NEPTUNE II (INSTRUMENTS) ×1 IMPLANT
NDL FILTER BLUNT 18X1 1/2 (NEEDLE) ×1 IMPLANT
NDL HYPO 22X1.5 SAFETY MO (MISCELLANEOUS) ×1 IMPLANT
NEEDLE FILTER BLUNT 18X1 1/2 (NEEDLE) ×1 IMPLANT
NEEDLE HYPO 22X1.5 SAFETY MO (MISCELLANEOUS) ×1 IMPLANT
NS IRRIG 500ML POUR BTL (IV SOLUTION) ×1 IMPLANT
PACK EXTREMITY ARMC (MISCELLANEOUS) ×1 IMPLANT
PAD ABD DERMACEA PRESS 5X9 (GAUZE/BANDAGES/DRESSINGS) ×1 IMPLANT
PAD CAST 4YDX4 CTTN HI CHSV (CAST SUPPLIES) ×1 IMPLANT
PAD PREP OB/GYN DISP 24X41 (PERSONAL CARE ITEMS) ×1 IMPLANT
PENCIL SMOKE EVACUATOR (MISCELLANEOUS) ×1 IMPLANT
PIN BALLS 3/8 F/.054-.062 WIRE (MISCELLANEOUS) ×1 IMPLANT
RASP SM TEAR CROSS CUT (RASP) ×1 IMPLANT
STOCKINETTE M/LG 89821 (MISCELLANEOUS) IMPLANT
STOCKINETTE STRL 6IN 960660 (GAUZE/BANDAGES/DRESSINGS) ×1 IMPLANT
STRAP SAFETY 5IN WIDE (MISCELLANEOUS) ×1 IMPLANT
STRIP CLOSURE SKIN 1/4X4 (GAUZE/BANDAGES/DRESSINGS) ×1 IMPLANT
SUT ETHILON 5-0 FS-2 18 BLK (SUTURE) ×1 IMPLANT
SUTURE MNCRL 4-0 27XMF (SUTURE) ×1 IMPLANT
SYR 10ML LL (SYRINGE) ×1 IMPLANT
SYR 50ML LL SCALE MARK (SYRINGE) ×1 IMPLANT
TRAP FLUID SMOKE EVACUATOR (MISCELLANEOUS) ×1 IMPLANT
WATER STERILE IRR 500ML POUR (IV SOLUTION) ×1 IMPLANT
WIRE Z .045 C-WIRE SPADE TIP (WIRE) IMPLANT
WIRE Z .062 C-WIRE SPADE TIP (WIRE) IMPLANT

## 2024-02-01 NOTE — Transfer of Care (Signed)
 Immediate Anesthesia Transfer of Care Note  Patient: Patrick Downs  Procedure(s) Performed: DEBRIDEMENT OF BONE RIGHT FIFTH METATARSAL (Right: Toe)  Patient Location: PACU  Anesthesia Type:MAC  Level of Consciousness: drowsy and patient cooperative  Airway & Oxygen Therapy: Patient Spontanous Breathing and Patient connected to face mask oxygen  Post-op Assessment: Report given to RN and Post -op Vital signs reviewed and stable  Post vital signs: Reviewed and stable  Last Vitals:  Vitals Value Taken Time  BP 141/114 02/01/24 13:34  Temp    Pulse 74 02/01/24 13:37  Resp 13 02/01/24 13:37  SpO2 98 % 02/01/24 13:37  Vitals shown include unfiled device data.  Last Pain:  Vitals:   02/01/24 1047  TempSrc: Temporal  PainSc: 0-No pain         Complications: No notable events documented.

## 2024-02-01 NOTE — Discharge Instructions (Signed)
[facility]  POST OPERATIVE INSTRUCTIONS FOR DR. FOWLER AND [facility] PODIATRY [facility]PART[facility]   Take your medication as prescribed.  Pain medication should be taken only as needed.  Keep the dressing clean, dry and intact.  Keep your foot elevated above the heart level for the first 48 hours.  Walking to the bathroom and brief periods of walking are acceptable, unless we have instructed you to be non-weight bearing.  Always wear your post-op shoe when walking.  Always use your crutches if you are to be non-weight bearing.  Do not take a shower. Baths are permissible as long as the foot is kept out of the water.   Every hour you are awake:  Bend your knee 15 times. Flex foot 15 times Massage calf 15 times  Call [facility] ([phone]) if any of the following problems occur: You develop a temperature or fever. The bandage becomes saturated with blood. Medication does not stop your pain. Injury of the foot occurs. Any symptoms of infection including redness, odor, or red streaks running from wound. 

## 2024-02-01 NOTE — Anesthesia Preprocedure Evaluation (Addendum)
 Anesthesia Evaluation  Patient identified by MRN, date of birth, ID band Patient awake    Reviewed: Allergy & Precautions, NPO status , Patient's Chart, lab work & pertinent test results  History of Anesthesia Complications Negative for: history of anesthetic complications  Airway Mallampati: II   Neck ROM: Full    Dental  (+) Missing   Pulmonary sleep apnea    Pulmonary exam normal breath sounds clear to auscultation       Cardiovascular +CHF  Normal cardiovascular exam Rhythm:Regular Rate:Normal  On Midodrine  for hypotension   Neuro/Psych Chronic pain; marijuana use  Neuromuscular disease (peripheral neuropathy; remote hx Guillain Barre)    GI/Hepatic negative GI ROS,,,  Endo/Other  Obesity   Renal/GU ESRF and DialysisRenal disease (last HD 01/31/24)     Musculoskeletal Gout    Abdominal   Peds  Hematology  (+) Blood dyscrasia, anemia   Anesthesia Other Findings   Reproductive/Obstetrics                              Anesthesia Physical Anesthesia Plan  ASA: 3  Anesthesia Plan: General   Post-op Pain Management:    Induction: Intravenous  PONV Risk Score and Plan: 2 and Propofol  infusion, TIVA and Treatment may vary due to age or medical condition  Airway Management Planned: Natural Airway and Nasal Cannula  Additional Equipment:   Intra-op Plan:   Post-operative Plan:   Informed Consent: I have reviewed the patients History and Physical, chart, labs and discussed the procedure including the risks, benefits and alternatives for the proposed anesthesia with the patient or authorized representative who has indicated his/her understanding and acceptance.       Plan Discussed with: CRNA  Anesthesia Plan Comments: (LMA/GETA backup discussed.  Patient consented for risks of anesthesia including but not limited to:  - adverse reactions to medications - damage to eyes,  teeth, lips or other oral mucosa - nerve damage due to positioning  - sore throat or hoarseness - damage to heart, brain, nerves, lungs, other parts of body or loss of life  Informed patient about role of CRNA in peri- and intra-operative care.  Patient voiced understanding.)         Anesthesia Quick Evaluation

## 2024-02-01 NOTE — Op Note (Signed)
 Operative note   Surgeon:Lynett Brasil Armed forces logistics/support/administrative officer: None    Preop diagnosis: Osteomyelitis right fifth metatarsal base    Postop diagnosis: Same    Procedure: 1.  Excision bone fifth metatarsal base right foot 2.  Intraoperative fluoroscopy without assistance of radiologist    EBL: Minimal    Anesthesia:local and IV sedation.  Local consists of a total of 20 cc of 0.5% plain bupivacaine     Hemostasis: Ankle tourniquet inflated to 200 mmHg for 20 minutes    Specimen: Bone for pathology and bone for culture    Complications: None    Operative indications:Patrick Downs is an 48 y.o. that presents today for surgical intervention.  The risks/benefits/alternatives/complications have been discussed and consent has been given.    Procedure:  Patient was brought into the OR and placed on the operating table in thesupine position. After anesthesia was obtained theright lower extremity was prepped and draped in usual sterile fashion.  Attention was directed to the lateral aspect of the right foot where the chronic open ulceration along the fifth metatarsal base was noted.  Intraoperative fluoroscopy was used to evaluate the fifth metatarsal base region.  The entire fifth metatarsal was intact.  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  At this time the incision was performed ellipsing the ulcer full-thickness in nature.  Further dissection was carried down to the fifth metatarsal base.  The peroneal tendon was dissected dorsal and plantar.  At this time and osteotomy was created along the fifth metatarsal base.  The infected soft bone was noted and the osteotomy was performed just distal.  This was taken into the fifth met cuboid region.  This bone was then removed from the surgical field in toto.  A small sample of the soft infected bone was sent for culture.  The remainder was sent for pathological examination with the margin inked.  The wound was flushed with copious amounts of  irrigation.  The remaining peroneal tendon was sutured in a tubular fashion.  A portion of the tendon was still attached to the residual base of the fifth metatarsal.  Surrounding soft tissue was sutured into the peroneal tendon.  The subcutaneous tissue was closed with a 3-0 Vicryl and the skin closed with a 3-0 nylon.  A large bulky sterile dressing was applied.    Patient tolerated the procedure and anesthesia well.  Was transported from the OR to the PACU with all vital signs stable and vascular status intact. To be discharged per routine protocol.  Will follow up in approximately 1 week in the outpatient clinic.

## 2024-02-01 NOTE — H&P (Signed)
 HISTORY AND PHYSICAL INTERVAL NOTE:  02/01/2024  12:19 PM  Patrick Downs  has presented today for surgery, with the diagnosis of Lower limb ulcer, heel or midfoot, right, with necrosis of bone HCC L97.414 Acute osteomyelitis of right foot HCC M86.171.  The various methods of treatment have been discussed with the patient.  No guarantees were given.  After consideration of risks, benefits and other options for treatment, the patient has consented to surgery.  I have reviewed the patients' chart and labs.     A history and physical examination was performed in my office.  The patient was reexamined.  There have been no changes to this history and physical examination.  [name]

## 2024-02-01 NOTE — Anesthesia Postprocedure Evaluation (Signed)
 Anesthesia Post Note  Patient: Patrick Downs  Procedure(s) Performed: DEBRIDEMENT OF BONE RIGHT FIFTH METATARSAL (Right: Toe)  Patient location during evaluation: PACU Anesthesia Type: General Level of consciousness: awake and alert, oriented and patient cooperative Pain management: pain level controlled Vital Signs Assessment: post-procedure vital signs reviewed and stable Respiratory status: spontaneous breathing, nonlabored ventilation and respiratory function stable Cardiovascular status: blood pressure returned to baseline and stable Postop Assessment: adequate PO intake Anesthetic complications: no Comments: Postop hypotension treated with ephedrine . Goal BP is within 20% of baseline, discussed with PACU nurse.   No notable events documented.   Last Vitals:  Vitals:   02/01/24 1437 02/01/24 1450  BP: (!) 74/49 (!) 78/49  Pulse: 87 85  Resp: 17 11  Temp:    SpO2: 100% 95%    Last Pain:  Vitals:   02/01/24 1415  TempSrc:   PainSc: Asleep                 Alfonso Ruths

## 2024-02-04 ENCOUNTER — Encounter: Payer: Self-pay | Admitting: Podiatry

## 2024-02-05 LAB — SURGICAL PATHOLOGY

## 2024-02-06 ENCOUNTER — Ambulatory Visit: Admitting: Infectious Diseases

## 2024-02-06 LAB — AEROBIC/ANAEROBIC CULTURE W GRAM STAIN (SURGICAL/DEEP WOUND): Gram Stain: NONE SEEN

## 2024-02-12 ENCOUNTER — Other Ambulatory Visit: Payer: Self-pay

## 2024-02-12 ENCOUNTER — Encounter: Payer: Self-pay | Admitting: Emergency Medicine

## 2024-02-12 ENCOUNTER — Emergency Department
Admission: EM | Admit: 2024-02-12 | Discharge: 2024-02-12 | Disposition: A | Discharge status: Home / Self Care | Attending: Emergency Medicine | Admitting: Emergency Medicine

## 2024-02-12 ENCOUNTER — Emergency Department

## 2024-02-12 DIAGNOSIS — Z992 Dependence on renal dialysis: Secondary | ICD-10-CM | POA: Insufficient documentation

## 2024-02-12 DIAGNOSIS — I959 Hypotension, unspecified: Secondary | ICD-10-CM | POA: Insufficient documentation

## 2024-02-12 DIAGNOSIS — N186 End stage renal disease: Secondary | ICD-10-CM | POA: Diagnosis not present

## 2024-02-12 DIAGNOSIS — R0602 Shortness of breath: Secondary | ICD-10-CM | POA: Diagnosis present

## 2024-02-12 LAB — BASIC METABOLIC PANEL WITH GFR
Anion gap: 16 — ABNORMAL HIGH (ref 5–15)
BUN: 23 mg/dL — ABNORMAL HIGH (ref 6–20)
CO2: 24 mmol/L (ref 22–32)
Calcium: 9.5 mg/dL (ref 8.9–10.3)
Chloride: 98 mmol/L (ref 98–111)
Creatinine, Ser: 7.11 mg/dL — ABNORMAL HIGH (ref 0.61–1.24)
GFR, Estimated: 9 mL/min — ABNORMAL LOW (ref >60)
Glucose, Bld: 105 mg/dL — ABNORMAL HIGH (ref 70–99)
Potassium: 4.1 mmol/L (ref 3.5–5.1)
Sodium: 138 mmol/L (ref 135–145)

## 2024-02-12 LAB — CBC
HCT: 36.7 % — ABNORMAL LOW (ref 39.0–52.0)
Hemoglobin: 11.6 g/dL — ABNORMAL LOW (ref 13.0–17.0)
MCH: 29.3 pg (ref 26.0–34.0)
MCHC: 31.6 g/dL (ref 30.0–36.0)
MCV: 92.7 fL (ref 80.0–100.0)
Platelets: 162 K/uL (ref 150–400)
RBC: 3.96 MIL/uL — ABNORMAL LOW (ref 4.22–5.81)
RDW: 14.7 % (ref 11.5–15.5)
WBC: 5.7 K/uL (ref 4.0–10.5)
nRBC: 0 % (ref 0.0–0.2)

## 2024-02-12 LAB — TROPONIN I (HIGH SENSITIVITY)
Troponin I (High Sensitivity): 5 ng/L (ref <18)
Troponin I (High Sensitivity): 6 ng/L (ref <18)

## 2024-02-12 MED ORDER — MIDODRINE HCL 5 MG PO TABS
5.0000 mg | ORAL_TABLET | Freq: Once | ORAL | Status: AC
Start: 2024-02-12 — End: 2024-02-12
  Administered 2024-02-12: 5 mg via ORAL
  Filled 2024-02-12: qty 1

## 2024-02-12 MED ORDER — SODIUM CHLORIDE 0.9 % IV BOLUS: 500.0000 mL | Freq: Once | INTRAVENOUS | Status: DC

## 2024-02-12 MED ORDER — SODIUM CHLORIDE 0.9 % IV BOLUS
250.0000 mL | Freq: Once | INTRAVENOUS | Status: AC
  Administered 2024-02-12: 250 mL via INTRAVENOUS

## 2024-02-12 NOTE — ED Triage Notes (Signed)
 Pt arrived to ED via EMS from dialysis. Pt became sob post 4 1/2 hour treatment and antibiotic administration. Initial blood pressure 69/37 upon EMS arrival. bolus given by dialysis staff with improved blood pressure of 107/38. Pt currently denies sob.

## 2024-02-12 NOTE — ED Notes (Signed)
 Pt discharged to home, AVS reviewed with pt and family.  Both parties verbalized understanding, no questions at this time.

## 2024-02-12 NOTE — ED Provider Notes (Signed)
 [facility] Provider Note    Event Date/Time   First MD Initiated Contact with Patient 02/12/24 1331     (approximate)   History   Shortness of Breath   HPI  Patrick Downs is a 48 y.o. male with the [employer] who comes in with concerns for shortness of breath.  Patient reports that after getting dialysis he gets an antibiotic and that he got the antibiotic and when they went to go stop the antibiotic they forgot to clamp and so he had some blood come out for about 10 seconds from his dialysis catheter.  He states that they then clamped it.  He reports that afterwards he felt some shortness of breath.  He reports feeling much improved right now.  However he was noted to have some low blood pressures.  However patient reports that he supposed to take midodrine  on the days that he has dialysis and he forgot to take it today.  He at this time really denies any chest pain, shortness of breath.  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 and underwent a procedure for this   Got 250 with EMS   Physical Exam   Triage Vital Signs: ED Triage Vitals  Encounter Vitals Group     BP 02/12/24 1319 (!) 76/41     Girls Systolic BP Percentile --      Girls Diastolic BP Percentile --      Boys Systolic BP Percentile --      Boys Diastolic BP Percentile --      Pulse Rate 02/12/24 1319 87     Resp 02/12/24 1319 18     Temp 02/12/24 1319 97.9 F (36.6 C)     Temp Source 02/12/24 1319 Oral     SpO2 02/12/24 1319 98 %     Weight 02/12/24 1326 216 lb (98 kg)     Height 02/12/24 1326 6' 2 (1.88 m)     Head Circumference --      Peak Flow --      Pain Score 02/12/24 1326 5     Pain Loc --      Pain Education --      Exclude from Growth Chart --     Most recent vital signs: Vitals:   02/12/24 1319  BP: (!) 76/41  Pulse: 87  Resp: 18  Temp: 97.9 F (36.6 C)  SpO2: 98%     General: Awake, no distress.   CV:  Good peripheral perfusion.  Resp:  Normal effort.  Abd:  No distention.  Soft nontender Other:   No swelling. Dialysis catheter noted on the right chest wall Patient has ulceration on the lateral aspect of his right foot without any erythema foot feels warm and well-perfused.  ED Results / Procedures / Treatments   Labs (all labs ordered are listed, but only abnormal results are displayed) Labs Reviewed  BASIC METABOLIC PANEL WITH GFR - Abnormal; Notable for the following components:      Result Value   Glucose, Bld 105 (*)    BUN 23 (*)    Creatinine, Ser 7.11 (*)    GFR, Estimated 9 (*)    Anion gap 16 (*)    All other components within normal limits  CBC - Abnormal; Notable for the following components:   RBC 3.96 (*)    Hemoglobin 11.6 (*)    HCT 36.7 (*)    All other components  within normal limits  TROPONIN I (HIGH SENSITIVITY)  TROPONIN I (HIGH SENSITIVITY)     EKG  My interpretation of EKG:  Normal Sinus without any ST elevation or T wave versions, normal intervals  RADIOLOGY I have reviewed the xray personally and interpreted no evidence any edema   PROCEDURES:  Critical Care performed: No  Procedures   MEDICATIONS ORDERED IN ED: Medications  sodium chloride  0.9 % bolus 500 mL (has no administration in time range)     IMPRESSION / MDM / ASSESSMENT AND PLAN / ED COURSE  I reviewed the triage vital signs and the nursing notes.   Patient's presentation is most consistent with acute presentation with potential threat to life or bodily function.   Patient comes in with concerns for shortness of breath after some blood coming out because of the possibly anemia although he reports that was only not planned for about 10 seconds I wonder if it was a vagal response.  However patient is definitely hypotensive but did report missing his midodrine .  Will give a small fluid bolus and midodrine .  He reports symptoms have resolved so that this makes me think  less likely PE will get cardiac markers to evaluate for ACS. Patient is on antibiotics currently for his foot but he has no fevers no evidence of sepsis.  Initial troponin was negative.  BMP is reassuring.  CBC with normal white count  I valuated patient's foot I do not see evidence of cellulitis or any discharge to suggest worsening infection here.  Looks similar to when patient had it recently debrided per patient.  Do not feel that this represents septic shock.  He is no fever no white count.  Patient reports symptoms have resolved.  We discussed doing a repeat troponin and if negative patient can be discharged home.  His blood pressures have come up with midodrine , fluid bolus and he feels much improved.   The patient is on the cardiac monitor to evaluate for evidence of arrhythmia and/or significant heart rate changes.    FINAL CLINICAL IMPRESSION(S) / ED DIAGNOSES   Final diagnoses:  Hypotension, unspecified hypotension type  ESRD (end stage renal disease) on dialysis [facility])     Rx / DC Orders   ED Discharge Orders     None        Note:  This document was prepared using Dragon voice recognition software and may include unintentional dictation errors.   Ernest Ronal BRAVO, MD 02/12/24 [id number]

## 2024-02-12 NOTE — ED Triage Notes (Signed)
 Patient to ED via [facility] from dialysis for SOB. PT reports receiving full 4.5 hour treatment afterwards receiving and antibiotics for his right foot (recently had surgery). Pt reports  nurse forgot to clamp the catheter prior to removing the antibiotics and he lost a lot of blood and the SOB started after. Received 250 mL bolus from EMS. States SOB feels better but still has tightness in chest.

## 2024-02-12 NOTE — Discharge Instructions (Addendum)
 Your evaluation in the emergency department is overall reassuring.  Please do follow-up with your primary care provider for reevaluation, and return to the emergency department with any new or worsening symptoms.

## 2024-02-12 NOTE — ED Notes (Signed)
 Pt foot wound cleaned with iodine , calcium  guaze placed per pt, wrapped with guaze wrap and ace bandage.

## 2024-02-12 NOTE — ED Provider Notes (Signed)
  Physical Exam  BP (!) 89/68   Pulse 68   Temp 97.9 F (36.6 C) (Oral)   Resp 13   Ht 6' 2 (1.88 m)   Wt 98 kg   SpO2 100%   BMI 27.73 kg/m   Physical Exam  Procedures  Procedures  ED Course / MDM   Clinical Course as of 02/12/24 1912  Tue Feb 12, 2024  1604 S/o from Dr. Ernest: - 2M hx esrd on hd, got dialysis today, treating for osteo R foot - forgot to clamp IV after abx at dialysis facility, bled for about 10 sec - arrived hypotensive, on midodrine , did not take today, BP improved  TO DO: - f/u trop, if pressure stable, DC [MM]  1826 Rpt trop stable [MM]  1911 Patient reevaluated, systolic in the 90s on my reevaluation.  Feeling well.  Reassured by unremarkable workup here.  Amenable to discharge home with plan for PMD follow-up per signout plan.  ED return precautions in place.  Patient and family agree with plan. [MM]    Clinical Course User Index [MM] Clarine Ozell LABOR, MD   Medical Decision Making Amount and/or Complexity of Data Reviewed Labs: ordered. Radiology: ordered.  Risk Prescription drug management.          Clarine Ozell LABOR, MD 02/12/24 [id number]

## 2024-02-18 ENCOUNTER — Encounter: Admitting: Nurse Practitioner

## 2024-02-19 ENCOUNTER — Encounter: Payer: Self-pay | Admitting: Nurse Practitioner

## 2024-02-19 ENCOUNTER — Ambulatory Visit: Attending: Nurse Practitioner | Admitting: Nurse Practitioner

## 2024-02-19 VITALS — BP 126/90 | HR 96 | Temp 97.3°F | Resp 16 | Ht 74.0 in | Wt 255.0 lb

## 2024-02-19 DIAGNOSIS — G610 Guillain-Barre syndrome: Secondary | ICD-10-CM | POA: Diagnosis present

## 2024-02-19 DIAGNOSIS — F1190 Opioid use, unspecified, uncomplicated: Secondary | ICD-10-CM | POA: Insufficient documentation

## 2024-02-19 DIAGNOSIS — G894 Chronic pain syndrome: Secondary | ICD-10-CM | POA: Diagnosis present

## 2024-02-19 DIAGNOSIS — M47816 Spondylosis without myelopathy or radiculopathy, lumbar region: Secondary | ICD-10-CM | POA: Diagnosis present

## 2024-02-19 DIAGNOSIS — R29898 Other symptoms and signs involving the musculoskeletal system: Secondary | ICD-10-CM | POA: Insufficient documentation

## 2024-02-19 MED ORDER — OXYCODONE-ACETAMINOPHEN 10-325 MG PO TABS: 1.0000 | ORAL_TABLET | ORAL | 0 refills | Status: AC | PRN

## 2024-02-19 MED ORDER — NALOXONE HCL 4 MG/0.1ML NA LIQD: 1.0000 | NASAL | 1 refills | Status: AC | PRN

## 2024-02-19 NOTE — Progress Notes (Signed)
 PROVIDER NOTE: Interpretation of information contained herein should be left to medically-trained personnel. Specific patient instructions are provided elsewhere under Patient Instructions section of medical record. This document was created in part using AI and STT-dictation technology, any transcriptional errors that may result from this process are unintentional.  Patient: Patrick Downs  Service: E/M   PCP: Epifanio Alm SQUIBB, MD  DOB: Sep 19, 1975  DOS: 02/19/2024  Provider: Emmy MARLA Blanch, NP  MRN: [id number]  Delivery: Face-to-face  Specialty: Interventional Pain Management  Type: Established Patient  Setting: Ambulatory outpatient facility  Specialty designation: 09  Referring Prov.: Epifanio Alm SQUIBB, MD  Location: Outpatient office facility       History of present illness (HPI) Patrick Downs, a 48 y.o. year old male, is here today because of his Chronic pain syndrome [G89.4]. Patrick Downs primary complain today is Back Pain  Pertinent problems: Patrick Downs has Degenerative disc disease, lumbar; Guillain-Barr syndrome (HCC); Gout; ESRD end-stage renal disease (on dialysis) (HCC); and Chronic pain syndrome on their pertinent problem list  Pain Assessment: Severity of   is reported as a 8 /10. Location: Back Lower/denies. Onset: More than a month ago. Quality: Aching, Tightness. Timing: Constant. Modifying factor(s): rest, streches. Vitals:  height is 6' 2 (1.88 m) and weight is 255 lb (115.7 kg). His temperature is 97.3 F (36.3 C) (abnormal). His blood pressure is 126/90 (abnormal) and his pulse is 96. His respiration is 16 and oxygen saturation is 100%.  BMI: Estimated body mass index is 32.74 kg/m as calculated from the following:   Height as of this encounter: 6' 2 (1.88 m).   Weight as of this encounter: 255 lb (115.7 kg).  Last encounter: Visit date not found. Last procedure: Visit date not found.  Reason for encounter: medication management.  The patient present today  for medication management as Dr. Myra is out of the office.  The patient continues experiencing chronic low back pain that radiates to bilateral hip and lower leg.  He continues to take his medication as prescribed and this continue to work well for him without any side effects or adverse reaction reported to medication.  The patient reports generalized weakness following dialysis, as he came straight to his appointment afterward.  Pharmacotherapy Assessment   Percocet 10-325 mg tab, every 4 hours as needed for pain. MME=92 Monitoring:  [place] PMP: PDMP reviewed during this encounter.       Pharmacotherapy: No side-effects or adverse reactions reported. Compliance: No problems identified. Effectiveness: Clinically acceptable.  Dorlene Montie FALCON, RN  02/19/2024 12:55 PM  Sign when Signing Visit Safety precautions to be maintained throughout the outpatient stay will include: orient to surroundings, keep bed in low position, maintain call bell within reach at all times, provide assistance with transfer out of bed and ambulation.   Patient just came from diaysis and did not bring medications with him, Last took pain medication on Sunday.    UDS:  No results found for: SUMMARY  No results found for: CBDTHCR No results found for: D8THCCBX No results found for: D9THCCBX  ROS  Constitutional: Denies any fever or chills Gastrointestinal: No reported hemesis, hematochezia, vomiting, or acute GI distress Musculoskeletal: Low back pain Neurological: No reported episodes of acute onset apraxia, aphasia, dysarthria, agnosia, amnesia, paralysis, loss of coordination, or loss of consciousness  Medication Review  acetaminophen , calcium  acetate, hydrOXYzine , midodrine , oxyCODONE -acetaminophen , and sildenafil  History Review  Allergy: Patrick Downs is allergic to hepatitis b vaccine, ondansetron , minoxidil, morphine  and  codeine, and omnipaque  [iohexol ]. Drug: Patrick Downs  reports current drug use.  Drug: Marijuana. Alcohol:  reports no history of alcohol use. Tobacco:  reports that he has never smoked. He has never used smokeless tobacco. Social: Patrick Downs  reports that he has never smoked. He has never used smokeless tobacco. He reports current drug use. Drug: Marijuana. He reports that he does not drink alcohol. Medical:  has a past medical history of CHF (congestive heart failure) (HCC), Degenerative disc disease, lumbar, Depression, Diabetes mellitus without complication (HCC), Dialysis patient, ESRD (end stage renal disease) on dialysis (HCC), Failure to thrive in adult, GERD (gastroesophageal reflux disease), Gout, Guillain Barr syndrome, Guillain Barr syndrome, Heart murmur, Hip pain, chronic, left (12/24/2020), HTN (hypertension), Hyperparathyroidism, Kidney failure, MRSA (methicillin resistant staph aureus) culture positive, Peripheral neuropathy, Pneumonia, Renal insufficiency, and Respiratory failure (HCC). Surgical: Patrick Downs  has a past surgical history that includes AV fistula placement; Parathyroidectomy; tonsiilectomy; tracheotomy; Renal biopsy; Amputation toe (Right, 06/08/2017); A/V Fistulagram (Right, 01/07/2018); TEE without cardioversion (N/A, 01/22/2018); Revison of arteriovenous fistula (Right, 02/07/2018); Amputation toe (Left, 05/17/2018); DIALYSIS/PERMA CATHETER REMOVAL (N/A, 06/17/2018); DIALYSIS/PERMA CATHETER INSERTION (N/A, 01/20/2019); PERIPHERAL VASCULAR THROMBECTOMY (Right, 01/22/2019); UPPER EXTREMITY VENOGRAPHY (Bilateral, 02/23/2020); Tonsillectomy; Amputation toe (Right, 03/05/2020); AV fistula placement (Right, 04/08/2020); Removal of graft (Right, 05/16/2020); Lower Extremity Angiography (Right, 01/24/2023); Irrigation and debridement foot (Right, 01/23/2023); and Metatarsal head excision (Right, 02/01/2024). Family: family history includes Diabetes Mellitus II in his father; Kidney disease in his father; Kidney failure in his paternal grandfather.  Laboratory Chemistry  Profile   Renal Lab Results  Component Value Date   BUN 23 (H) 02/12/2024   CREATININE 7.11 (H) 02/12/2024   GFRAA NOT CALCULATED 07/31/2022   GFRNONAA 9 (L) 02/12/2024    Hepatic Lab Results  Component Value Date   AST 27 02/13/2023   ALT 30 02/13/2023   ALBUMIN 3.5 02/22/2023   ALKPHOS 82 02/13/2023   HCVAB NON REACTIVE 01/26/2020   LIPASE 37 09/16/2021    Electrolytes Lab Results  Component Value Date   NA 138 02/12/2024   K 4.1 02/12/2024   CL 98 02/12/2024   CALCIUM  9.5 02/12/2024   MG 2.1 02/17/2023   PHOS 2.8 02/22/2023    Bone Lab Results  Component Value Date   VD25OH 14.14 (L) 05/15/2020    Inflammation (CRP: Acute Phase) (ESR: Chronic Phase) Lab Results  Component Value Date   CRP 10.6 (H) 01/21/2023   ESRSEDRATE 68 (H) 01/21/2023   LATICACIDVEN 1.3 02/16/2023         Note: Above Lab results reviewed.  Recent Imaging Review  DG Chest Port 1 View CLINICAL DATA:  Shortness of breath  EXAM: PORTABLE CHEST 1 VIEW  COMPARISON:  07/31/2022  FINDINGS: Two frontal views of the chest demonstrate right internal jugular catheter tip overlying the right atrium. The cardiac silhouette is unremarkable. No acute airspace disease, effusion, or pneumothorax. There are no acute bony abnormalities.  IMPRESSION: 1. No acute intrathoracic process.  Electronically Signed   By: Patrick Daring M.D.   On: 02/12/2024 15:15 Note: Reviewed        Physical Exam  Vitals: BP (!) 126/90 Comment: left forearm just came from diaysis, AOx3  Pulse 96   Temp (!) 97.3 F (36.3 C)   Resp 16   Ht 6' 2 (1.88 m)   Wt 255 lb (115.7 kg)   SpO2 100%   BMI 32.74 kg/m  BMI: Estimated body mass index is 32.74 kg/m as calculated from the  following:   Height as of this encounter: 6' 2 (1.88 m).   Weight as of this encounter: 255 lb (115.7 kg). Ideal: Ideal body weight: 82.2 kg (181 lb 3.5 oz) Adjusted ideal body weight: 95.6 kg (210 lb 11.7 oz) General appearance: Well  nourished, well developed, and well hydrated. In no apparent acute distress Mental status: Alert, oriented x 3 (person, place, & time)       Respiratory: No evidence of acute respiratory distress Eyes: PERLA  Musculoskeletal: +LBP Assessment   Diagnosis Status  1. Chronic pain syndrome   2. Chronic, continuous use of opioids   3. Facet arthritis of lumbar region   4. Guillain-Barre syndrome   5. Weakness of both lower extremities    Controlled Controlled Controlled   Updated Problems: No problems updated.  Plan of Care  Problem-specific:  Assessment and Plan Chronic pain syndrome: Patient's pain is well-controlled with oxycodone , will continue on current medication regimen.  Prescribing drug monitoring (PMP) reviewed, findings consistent with the use of prescribed medication and no evidence of narcotic misuse or abuse.  Urine drug serum up to date and consistent with the use of medication.  No adverse reaction or side effects reported from medication.  Schedule follow-up in 2 months with Dr. Myra.  Chronic continuous use of opioids: Medications   oxyCODONE -acetaminophen  (PERCOCET) 10-325 MG tablet    Sig: Take 1 tablet by mouth every 4 (four) hours as needed for up to 23 days for pain. Must last 30 days.    Dispense:  135 tablet    Refill:  0    Chronic Pain: STOP Act (Not applicable) Fill 1 day early if closed on refill date. Avoid benzodiazepines within 8 hours of opioids   oxyCODONE -acetaminophen  (PERCOCET) 10-325 MG tablet    Sig: Take 1 tablet by mouth every 4 (four) hours as needed for up to 23 days for pain. Must last 30 days.    Dispense:  135 tablet    Refill:  0    Chronic Pain: STOP Act (Not applicable) Fill 1 day early if closed on refill date. Avoid benzodiazepines within 8 hours of opioids     Patrick Downs has a current medication list which includes the following long-term medication(s): sildenafil.  Pharmacotherapy (Medications Ordered): Meds  ordered this encounter  Medications   oxyCODONE -acetaminophen  (PERCOCET) 10-325 MG tablet    Sig: Take 1 tablet by mouth every 4 (four) hours as needed for up to 23 days for pain. Must last 30 days.    Dispense:  135 tablet    Refill:  0    Chronic Pain: STOP Act (Not applicable) Fill 1 day early if closed on refill date. Avoid benzodiazepines within 8 hours of opioids   oxyCODONE -acetaminophen  (PERCOCET) 10-325 MG tablet    Sig: Take 1 tablet by mouth every 4 (four) hours as needed for up to 23 days for pain. Must last 30 days.    Dispense:  135 tablet    Refill:  0    Chronic Pain: STOP Act (Not applicable) Fill 1 day early if closed on refill date. Avoid benzodiazepines within 8 hours of opioids   Orders:  No orders of the defined types were placed in this encounter.       Return in about 2 months (around 04/20/2024) for (F2F), (MM) with Dr. Myra .    Recent Visits Date Type Provider Dept  12/04/23 Office Visit Myra Lynwood MATSU, MD [facility]  Showing recent visits within  past 90 days and meeting all other requirements Today's Visits Date Type Provider Dept  02/19/24 Office Visit Sofie Schendel K, NP [facility]  Showing today's visits and meeting all other requirements Future Appointments No visits were found meeting these conditions. Showing future appointments within next 90 days and meeting all other requirements  I discussed the assessment and treatment plan with the patient. The patient was provided an opportunity to ask questions and all were answered. The patient agreed with the plan and demonstrated an understanding of the instructions.  Patient advised to call back or seek an in-person evaluation if the symptoms or condition worsens.  I personally spent a total of 30 minutes in the care of the patient today including preparing to see the patient, getting/reviewing separately obtained history, performing a medically appropriate exam/evaluation,  counseling and educating, placing orders, referring and communicating with other health care professionals, documenting clinical information in the EHR, independently interpreting results, communicating results, and coordinating care.   Note by: Emmy MARLA Blanch, NP  Date: 02/19/2024; Time: 1:02 PM

## 2024-02-19 NOTE — Patient Instructions (Signed)

## 2024-02-19 NOTE — Progress Notes (Signed)
 Safety precautions to be maintained throughout the outpatient stay will include: orient to surroundings, keep bed in low position, maintain call bell within reach at all times, provide assistance with transfer out of bed and ambulation.   Patient just came from diaysis and did not bring medications with him, Last took pain medication on Sunday.

## 2024-04-07 ENCOUNTER — Other Ambulatory Visit: Payer: Self-pay | Admitting: Podiatry

## 2024-04-07 DIAGNOSIS — L97414 Non-pressure chronic ulcer of right heel and midfoot with necrosis of bone: Secondary | ICD-10-CM

## 2024-04-08 ENCOUNTER — Ambulatory Visit: Attending: Anesthesiology | Admitting: Anesthesiology

## 2024-04-08 ENCOUNTER — Encounter: Payer: Self-pay | Admitting: Anesthesiology

## 2024-04-08 VITALS — BP 61/48 | HR 91 | Temp 98.1°F | Resp 16 | Ht 74.0 in | Wt 260.0 lb

## 2024-04-08 DIAGNOSIS — R29898 Other symptoms and signs involving the musculoskeletal system: Secondary | ICD-10-CM | POA: Insufficient documentation

## 2024-04-08 DIAGNOSIS — G894 Chronic pain syndrome: Secondary | ICD-10-CM | POA: Diagnosis not present

## 2024-04-08 DIAGNOSIS — G8929 Other chronic pain: Secondary | ICD-10-CM | POA: Diagnosis present

## 2024-04-08 DIAGNOSIS — M545 Low back pain, unspecified: Secondary | ICD-10-CM | POA: Diagnosis present

## 2024-04-08 DIAGNOSIS — F1190 Opioid use, unspecified, uncomplicated: Secondary | ICD-10-CM

## 2024-04-08 DIAGNOSIS — G610 Guillain-Barre syndrome: Secondary | ICD-10-CM | POA: Diagnosis present

## 2024-04-08 DIAGNOSIS — M5136 Other intervertebral disc degeneration, lumbar region with discogenic back pain only: Secondary | ICD-10-CM

## 2024-04-08 DIAGNOSIS — Z992 Dependence on renal dialysis: Secondary | ICD-10-CM | POA: Diagnosis present

## 2024-04-08 DIAGNOSIS — M47816 Spondylosis without myelopathy or radiculopathy, lumbar region: Secondary | ICD-10-CM | POA: Diagnosis not present

## 2024-04-08 DIAGNOSIS — Z79891 Long term (current) use of opiate analgesic: Secondary | ICD-10-CM | POA: Diagnosis not present

## 2024-04-08 DIAGNOSIS — N186 End stage renal disease: Secondary | ICD-10-CM | POA: Diagnosis present

## 2024-04-08 DIAGNOSIS — M25552 Pain in left hip: Secondary | ICD-10-CM | POA: Diagnosis present

## 2024-04-08 MED ORDER — OXYCODONE-ACETAMINOPHEN 10-325 MG PO TABS: 1.0000 | ORAL_TABLET | ORAL | 0 refills | Status: AC | PRN

## 2024-04-08 NOTE — Progress Notes (Unsigned)
 Subjective:  Patient ID: Patrick Downs, male    DOB: [birth year]  Age: 48 y.o. MRN: [id number]  CC: Back Pain (Lumbar midline )   Procedure: None  HPI Patrick Downs presents for reevaluation.  Patrick Downs 2 months ago and continues to do well with his regimen.  He takes his Percocet about every 4-6 hours and continues to get 75 to 100% relief contingent on his level of activity.  Without the medications he continues to have severe low back pain with radiation into the bilateral hip buttock and lower legs.  The quality characteristic and distribution of this has been stable.  He has chronic lower extremity weakness secondary to Guillain-Barr but this also has been stable with no change in his lower extremity strength.  Bowel bladder function has been stable though he is in stage renal disease.  He continues with dialysis.  No side effects with the medication are noted.  Otherwise he reports being in his usual state of health.  Outpatient Medications Prior to Visit  Medication Sig Dispense Refill   acetaminophen  (TYLENOL ) 500 MG tablet Take 1,000-1,500 mg by mouth every 6 (six) hours as needed for moderate pain (pain score 4-6) or headache.     calcium  acetate (PHOSLO ) 667 MG capsule Take 1,334-2,001 mg by mouth 3 (three) times daily with meals.     hydrOXYzine  (ATARAX ) 25 MG tablet Take 25 mg by mouth 2 (two) times daily as needed for anxiety.     midodrine  (PROAMATINE ) 5 MG tablet Take 1 tablet (5 mg total) by mouth 3 (three) times daily with meals as needed (For systolic below 120). Check your blood pressure before taking, take for blood pressure less than 120 90 tablet 1   naloxone  (NARCAN ) nasal spray 4 mg/0.1 mL Place 1 spray into the nose as needed for up to 365 doses (for opioid-induced respiratory depresssion). In case of emergency (overdose), spray once into each nostril. If no response within 3 minutes, repeat application and call 911. 1 each 1   sildenafil (VIAGRA) 100 MG tablet Take  100 mg by mouth daily as needed for erectile dysfunction.     No facility-administered medications prior to visit.    Review of Systems CNS: No confusion or sedation Cardiac: No angina or palpitations GI: No abdominal pain or constipation Constitutional: No nausea vomiting fevers or chills  Objective:  BP (!) 61/48 (BP Location: Right Arm, Patient Position: Sitting, Cuff Size: Normal)   Pulse 91   Temp 98.1 F (36.7 C) (Temporal)   Resp 16   Ht 6' 2 (1.88 m)   Wt 260 lb (117.9 kg)   SpO2 97%   BMI 33.38 kg/m    BP Readings from Last 3 Encounters:  04/08/24 (!) 61/48  02/19/24 (!) 126/90  02/12/24 99/69     Wt Readings from Last 3 Encounters:  04/08/24 260 lb (117.9 kg)  02/19/24 255 lb (115.7 kg)  02/12/24 216 lb (98 kg)     Physical Exam Pt is alert and oriented PERRL EOMI HEART IS RRR no murmur or rub LCTA no wheezing or rales MUSCULOSKELETAL reveals some paraspinous muscle tenderness in the lumbar region but no overt trigger points.  He goes from seated to standing without much difficulty.  He continues to use a brace on his legs.  He walks with an antalgic gait.  Labs  Lab Results  Component Value Date   HGBA1C 6.0 (H) 01/21/2023   HGBA1C 5.4 05/14/2020   HGBA1C 5.7 (H) 01/26/2020  Lab Results  Component Value Date   LDLCALC 111 (H) 01/26/2020   CREATININE 7.11 (H) 02/12/2024    -------------------------------------------------------------------------------------------------------------------- Lab Results  Component Value Date   WBC 5.7 02/12/2024   HGB 11.6 (L) 02/12/2024   HCT 36.7 (L) 02/12/2024   PLT 162 02/12/2024   GLUCOSE 105 (H) 02/12/2024   CHOL 162 01/26/2020   TRIG 99 01/26/2020   HDL 31 (L) 01/26/2020   LDLCALC 111 (H) 01/26/2020   ALT 30 02/13/2023   AST 27 02/13/2023   NA 138 02/12/2024   K 4.1 02/12/2024   CL 98 02/12/2024   CREATININE 7.11 (H) 02/12/2024   BUN 23 (H) 02/12/2024   CO2 24 02/12/2024   TSH 2.932  08/08/2019   INR 1.2 01/21/2023   HGBA1C 6.0 (H) 01/21/2023    --------------------------------------------------------------------------------------------------------------------- DG Chest Port 1 View Result Date: 02/12/2024 CLINICAL DATA:  Shortness of breath EXAM: PORTABLE CHEST 1 VIEW COMPARISON:  07/31/2022 FINDINGS: Two frontal views of the chest demonstrate right internal jugular catheter tip overlying the right atrium. The cardiac silhouette is unremarkable. No acute airspace disease, effusion, or pneumothorax. There are no acute bony abnormalities. IMPRESSION: 1. No acute intrathoracic process. Electronically Signed   By: Patrick Downs M.D.   On: 02/12/2024 15:15     Assessment & Plan:   [name] was seen today for back pain.  Diagnoses and all orders for this visit:  Chronic pain syndrome -     Drug Screen 10 W/Conf, Serum  Chronic, continuous use of opioids -     Drug Screen 10 W/Conf, Serum  Facet arthritis of lumbar region  Guillain-Barre syndrome  Weakness of both lower extremities  ESRD on dialysis (HCC)  Chronic bilateral low back pain without sciatica  Hip pain, chronic, left  Degeneration of intervertebral disc of lumbar region with discogenic back pain  Other orders -     oxyCODONE -acetaminophen  (PERCOCET) 10-325 MG tablet; Take 1 tablet by mouth every 4 (four) hours as needed for pain. -     oxyCODONE -acetaminophen  (PERCOCET) 10-325 MG tablet; Take 1 tablet by mouth every 4 (four) hours as needed for pain.        ----------------------------------------------------------------------------------------------------------------------  Problem List Items Addressed This Visit       Unprioritized   Degenerative disc disease, lumbar   Hip pain, chronic, left   Relevant Medications   oxyCODONE -acetaminophen  (PERCOCET) 10-325 MG tablet (Start on 04/20/2024)   oxyCODONE -acetaminophen  (PERCOCET) 10-325 MG tablet (Start on 05/20/2024)   Peripheral  neuropathy   Other Visit Diagnoses       Chronic pain syndrome    -  Primary   Relevant Medications   oxyCODONE -acetaminophen  (PERCOCET) 10-325 MG tablet (Start on 04/20/2024)   oxyCODONE -acetaminophen  (PERCOCET) 10-325 MG tablet (Start on 05/20/2024)   Other Relevant Orders   Drug Screen 10 W/Conf, Serum     Chronic, continuous use of opioids       Relevant Orders   Drug Screen 10 W/Conf, Serum     Facet arthritis of lumbar region       Relevant Medications   oxyCODONE -acetaminophen  (PERCOCET) 10-325 MG tablet (Start on 04/20/2024)   oxyCODONE -acetaminophen  (PERCOCET) 10-325 MG tablet (Start on 05/20/2024)     Weakness of both lower extremities         ESRD on dialysis (HCC)         Chronic bilateral low back pain without sciatica       Relevant Medications   oxyCODONE -acetaminophen  (PERCOCET) 10-325 MG tablet (Start on 04/20/2024)  oxyCODONE -acetaminophen  (PERCOCET) 10-325 MG tablet (Start on 05/20/2024)         ----------------------------------------------------------------------------------------------------------------------  1. Chronic pain syndrome (Primary) I have reviewed the [place]  practitioner database information and it is appropriate for refill.  We will keep him on his current medication regimen with refills dated for December 28 and January 27.  No other changes are initiated. - Drug Screen 10 W/Conf, Serum  2. Chronic, continuous use of opioids As above.  He continues to gain and derive good functional relief with the medication regiment whereas he has failed all conservative therapy. - Drug Screen 10 W/Conf, Serum  3. Facet arthritis of lumbar region Continue core stretching strengthening exercises as he is doing.  4. Guillain-Barre syndrome As above  5. Weakness of both lower extremities As above  6. ESRD on dialysis [facility]) Continue follow-up with his kidney doctors  7. Chronic bilateral low back pain without sciatica As above  8. Hip  pain, chronic, left   9. Degeneration of intervertebral disc of lumbar region with discogenic back pain     ----------------------------------------------------------------------------------------------------------------------  I am having Patrick CANDIE Hope start on oxyCODONE -acetaminophen  and oxyCODONE -acetaminophen . I am also having him maintain his acetaminophen , hydrOXYzine , sildenafil, midodrine , calcium  acetate, and naloxone .   Meds ordered this encounter  Medications   oxyCODONE -acetaminophen  (PERCOCET) 10-325 MG tablet    Sig: Take 1 tablet by mouth every 4 (four) hours as needed for pain.    Dispense:  135 tablet    Refill:  0   oxyCODONE -acetaminophen  (PERCOCET) 10-325 MG tablet    Sig: Take 1 tablet by mouth every 4 (four) hours as needed for pain.    Dispense:  135 tablet    Refill:  0   Patient's Medications  New Prescriptions   OXYCODONE -ACETAMINOPHEN  (PERCOCET) 10-325 MG TABLET    Take 1 tablet by mouth every 4 (four) hours as needed for pain.   OXYCODONE -ACETAMINOPHEN  (PERCOCET) 10-325 MG TABLET    Take 1 tablet by mouth every 4 (four) hours as needed for pain.  Previous Medications   ACETAMINOPHEN  (TYLENOL ) 500 MG TABLET    Take 1,000-1,500 mg by mouth every 6 (six) hours as needed for moderate pain (pain score 4-6) or headache.   CALCIUM  ACETATE (PHOSLO ) 667 MG CAPSULE    Take 1,334-2,001 mg by mouth 3 (three) times daily with meals.   HYDROXYZINE  (ATARAX ) 25 MG TABLET    Take 25 mg by mouth 2 (two) times daily as needed for anxiety.   MIDODRINE  (PROAMATINE ) 5 MG TABLET    Take 1 tablet (5 mg total) by mouth 3 (three) times daily with meals as needed (For systolic below 120). Check your blood pressure before taking, take for blood pressure less than 120   NALOXONE  (NARCAN ) NASAL SPRAY 4 MG/0.1 ML    Place 1 spray into the nose as needed for up to 365 doses (for opioid-induced respiratory depresssion). In case of emergency (overdose), spray once into each nostril. If  no response within 3 minutes, repeat application and call 911.   SILDENAFIL (VIAGRA) 100 MG TABLET    Take 100 mg by mouth daily as needed for erectile dysfunction.  Modified Medications   No medications on file  Discontinued Medications   No medications on file   ----------------------------------------------------------------------------------------------------------------------  Follow-up: Return in about 2 months (around 06/09/2024) for evaluation, med refill.  Continue follow-up with her primary care physicians for baseline medical care with scheduled return to clinic in 2 months.  Lynwood KANDICE Clause, MD

## 2024-04-08 NOTE — Progress Notes (Unsigned)
 04/08/24- Serum Drug Screen Ordered, Patient refused to get blood work done today. States he already called his ride, he would get it when he came back at his next appointment. Discussed with RN, was directed to inform patient that we would cancel the  Rx dated 05/20/24 and he could have 30 days of medication, and provider will reinstate after Serum Drug screen. Patient verbalized understanding.

## 2024-04-08 NOTE — Progress Notes (Unsigned)
Nursing Pain Medication Assessment:  Safety precautions to be maintained throughout the outpatient stay will include: orient to surroundings, keep bed in low position, maintain call bell within reach at all times, provide assistance with transfer out of bed and ambulation.  Medication Inspection Compliance: Mr. Russey did not comply with our request to bring his pills to be counted. He was reminded that bringing the medication bottles, even when empty, is a requirement.  Medication: None brought in. Pill/Patch Count: None available to be counted. Bottle Appearance: No container available. Did not bring bottle(s) to appointment. Filled Date: N/A Last Medication intake:  Yesterday

## 2024-04-12 ENCOUNTER — Other Ambulatory Visit

## 2024-05-07 ENCOUNTER — Encounter: Payer: Self-pay | Admitting: Anesthesiology

## 2024-05-07 ENCOUNTER — Telehealth: Payer: Self-pay | Admitting: Anesthesiology

## 2024-05-07 NOTE — Telephone Encounter (Signed)
 Letter sent to patient email LP

## 2024-05-07 NOTE — Telephone Encounter (Signed)
 This patient is asking for a letter from Dr Myra stating hi is being treated for chronic pain and what medications he is receiving. He got one last year. Now they want a new one.  Send to [employer]  Fax [phone]

## 2024-05-26 LAB — DRUG SCREEN 10 W/CONF, SERUM
Amphetamines, IA: NEGATIVE ng/mL
Barbiturates, IA: NEGATIVE ug/mL
Benzodiazepines, IA: NEGATIVE ng/mL
Cocaine & Metabolite, IA: NEGATIVE ng/mL
Methadone, IA: NEGATIVE ng/mL
Opiates, IA: NEGATIVE ng/mL
Oxycodones, IA: POSITIVE ng/mL — AB
Phencyclidine, IA: NEGATIVE ng/mL
Propoxyphene, IA: NEGATIVE ng/mL

## 2024-05-26 LAB — THC,MS,WB/SP RFX
Cannabidiol: NEGATIVE
Cannabinoid Confirmation: NEGATIVE
Carboxy-THC: NEGATIVE ng/mL
Hydroxy-THC: NEGATIVE ng/mL
Tetrahydrocannabinol(THC): NEGATIVE ng/mL

## 2024-05-26 LAB — OXYCODONES,MS,WB/SP RFX
Oxycocone: 4.1 ng/mL
Oxycodones Confirmation: POSITIVE
Oxymorphone: NEGATIVE ng/mL

## 2024-06-02 ENCOUNTER — Encounter: Admitting: Anesthesiology
